# Patient Record
Sex: Female | Born: 1940
Health system: Southern US, Community
[De-identification: ages and names within clinical notes are randomized; demographics above are authoritative.]

## PROBLEM LIST (undated history)

## (undated) DIAGNOSIS — R001 Bradycardia, unspecified: Secondary | ICD-10-CM

## (undated) DIAGNOSIS — N183 Chronic kidney disease, stage 3 unspecified: Secondary | ICD-10-CM

## (undated) DIAGNOSIS — R609 Edema, unspecified: Secondary | ICD-10-CM

## (undated) DIAGNOSIS — Z9289 Personal history of other medical treatment: Secondary | ICD-10-CM

## (undated) DIAGNOSIS — I639 Cerebral infarction, unspecified: Secondary | ICD-10-CM

## (undated) DIAGNOSIS — G4733 Obstructive sleep apnea (adult) (pediatric): Secondary | ICD-10-CM

## (undated) DIAGNOSIS — K922 Gastrointestinal hemorrhage, unspecified: Secondary | ICD-10-CM

## (undated) DIAGNOSIS — I10 Essential (primary) hypertension: Secondary | ICD-10-CM

## (undated) DIAGNOSIS — I482 Chronic atrial fibrillation, unspecified: Secondary | ICD-10-CM

## (undated) DIAGNOSIS — D689 Coagulation defect, unspecified: Secondary | ICD-10-CM

## (undated) DIAGNOSIS — M1990 Unspecified osteoarthritis, unspecified site: Secondary | ICD-10-CM

## (undated) DIAGNOSIS — N179 Acute kidney failure, unspecified: Secondary | ICD-10-CM

## (undated) DIAGNOSIS — I272 Pulmonary hypertension, unspecified: Secondary | ICD-10-CM

## (undated) DIAGNOSIS — E785 Hyperlipidemia, unspecified: Secondary | ICD-10-CM

## (undated) DIAGNOSIS — K5790 Diverticulosis of intestine, part unspecified, without perforation or abscess without bleeding: Secondary | ICD-10-CM

## (undated) DIAGNOSIS — I872 Venous insufficiency (chronic) (peripheral): Secondary | ICD-10-CM

## (undated) DIAGNOSIS — K219 Gastro-esophageal reflux disease without esophagitis: Secondary | ICD-10-CM

## (undated) DIAGNOSIS — D649 Anemia, unspecified: Secondary | ICD-10-CM

## (undated) DIAGNOSIS — N280 Ischemia and infarction of kidney: Secondary | ICD-10-CM

## (undated) DIAGNOSIS — N12 Tubulo-interstitial nephritis, not specified as acute or chronic: Secondary | ICD-10-CM

## (undated) HISTORY — PX: EYE SURGERY: SHX253

## (undated) HISTORY — DX: Morbid (severe) obesity due to excess calories: E66.01

## (undated) HISTORY — DX: Edema, unspecified: R60.9

## (undated) HISTORY — PX: TUBAL LIGATION: SHX77

## (undated) HISTORY — DX: Ischemia and infarction of kidney: N28.0

## (undated) HISTORY — PX: JOINT REPLACEMENT: SHX530

## (undated) HISTORY — DX: Chronic kidney disease, stage 3 unspecified: N18.30

## (undated) HISTORY — PX: REPLACEMENT TOTAL KNEE BILATERAL: SUR1225

## (undated) HISTORY — DX: Chronic atrial fibrillation, unspecified: I48.20

## (undated) HISTORY — DX: Bradycardia, unspecified: R00.1

## (undated) HISTORY — DX: Pulmonary hypertension, unspecified: I27.20

## (undated) HISTORY — DX: Hyperlipidemia, unspecified: E78.5

## (undated) HISTORY — DX: Chronic kidney disease, stage 3 (moderate): N18.3

## (undated) HISTORY — DX: Cerebral infarction, unspecified: I63.9

## (undated) HISTORY — DX: Venous insufficiency (chronic) (peripheral): I87.2

---

## 1997-10-18 ENCOUNTER — Other Ambulatory Visit: Admission: RE | Admit: 1997-10-18 | Discharge: 1997-10-18 | Payer: Self-pay | Admitting: Family Medicine

## 1997-11-09 ENCOUNTER — Inpatient Hospital Stay (HOSPITAL_COMMUNITY): Admission: EM | Admit: 1997-11-09 | Discharge: 1997-11-11 | Payer: Self-pay | Admitting: Emergency Medicine

## 1998-06-16 ENCOUNTER — Encounter: Payer: Self-pay | Admitting: Emergency Medicine

## 1998-06-16 ENCOUNTER — Emergency Department (HOSPITAL_COMMUNITY): Admission: EM | Admit: 1998-06-16 | Discharge: 1998-06-16 | Payer: Self-pay | Admitting: Emergency Medicine

## 1999-01-14 ENCOUNTER — Encounter: Payer: Self-pay | Admitting: Emergency Medicine

## 1999-01-14 ENCOUNTER — Emergency Department (HOSPITAL_COMMUNITY): Admission: EM | Admit: 1999-01-14 | Discharge: 1999-01-14 | Payer: Self-pay | Admitting: Emergency Medicine

## 1999-03-27 ENCOUNTER — Encounter: Payer: Self-pay | Admitting: Emergency Medicine

## 1999-03-27 ENCOUNTER — Emergency Department (HOSPITAL_COMMUNITY): Admission: EM | Admit: 1999-03-27 | Discharge: 1999-03-27 | Payer: Self-pay | Admitting: Emergency Medicine

## 1999-06-22 ENCOUNTER — Emergency Department (HOSPITAL_COMMUNITY): Admission: EM | Admit: 1999-06-22 | Discharge: 1999-06-22 | Payer: Self-pay | Admitting: *Deleted

## 1999-06-22 ENCOUNTER — Encounter: Payer: Self-pay | Admitting: *Deleted

## 1999-08-09 ENCOUNTER — Inpatient Hospital Stay (HOSPITAL_COMMUNITY): Admission: EM | Admit: 1999-08-09 | Discharge: 1999-08-13 | Payer: Self-pay | Admitting: Emergency Medicine

## 1999-08-09 ENCOUNTER — Encounter: Payer: Self-pay | Admitting: Emergency Medicine

## 1999-08-11 ENCOUNTER — Encounter: Payer: Self-pay | Admitting: Internal Medicine

## 1999-09-05 ENCOUNTER — Encounter: Payer: Self-pay | Admitting: Internal Medicine

## 1999-09-05 ENCOUNTER — Ambulatory Visit (HOSPITAL_COMMUNITY): Admission: RE | Admit: 1999-09-05 | Discharge: 1999-09-05 | Payer: Self-pay | Admitting: Internal Medicine

## 1999-12-01 ENCOUNTER — Other Ambulatory Visit: Admission: RE | Admit: 1999-12-01 | Discharge: 1999-12-01 | Payer: Self-pay | Admitting: Obstetrics and Gynecology

## 1999-12-01 ENCOUNTER — Encounter (INDEPENDENT_AMBULATORY_CARE_PROVIDER_SITE_OTHER): Payer: Self-pay | Admitting: Specialist

## 2000-04-30 ENCOUNTER — Ambulatory Visit: Admission: RE | Admit: 2000-04-30 | Discharge: 2000-04-30 | Payer: Self-pay | Admitting: Orthopedic Surgery

## 2001-02-11 ENCOUNTER — Emergency Department (HOSPITAL_COMMUNITY): Admission: EM | Admit: 2001-02-11 | Discharge: 2001-02-11 | Payer: Self-pay

## 2001-04-29 ENCOUNTER — Emergency Department (HOSPITAL_COMMUNITY): Admission: EM | Admit: 2001-04-29 | Discharge: 2001-04-30 | Payer: Self-pay | Admitting: *Deleted

## 2001-04-30 ENCOUNTER — Encounter: Payer: Self-pay | Admitting: Emergency Medicine

## 2001-06-16 ENCOUNTER — Encounter: Payer: Self-pay | Admitting: Orthopedic Surgery

## 2001-06-22 ENCOUNTER — Inpatient Hospital Stay (HOSPITAL_COMMUNITY): Admission: RE | Admit: 2001-06-22 | Discharge: 2001-06-27 | Payer: Self-pay | Admitting: Orthopedic Surgery

## 2002-04-06 ENCOUNTER — Emergency Department (HOSPITAL_COMMUNITY): Admission: EM | Admit: 2002-04-06 | Discharge: 2002-04-06 | Payer: Self-pay | Admitting: Emergency Medicine

## 2002-04-06 ENCOUNTER — Encounter: Payer: Self-pay | Admitting: Emergency Medicine

## 2002-06-05 ENCOUNTER — Encounter: Admission: RE | Admit: 2002-06-05 | Discharge: 2002-06-05 | Payer: Self-pay | Admitting: Orthopedic Surgery

## 2002-06-05 ENCOUNTER — Encounter: Payer: Self-pay | Admitting: Orthopedic Surgery

## 2002-06-07 ENCOUNTER — Other Ambulatory Visit: Admission: RE | Admit: 2002-06-07 | Discharge: 2002-06-07 | Payer: Self-pay | Admitting: Obstetrics and Gynecology

## 2002-06-16 ENCOUNTER — Encounter: Payer: Self-pay | Admitting: Orthopedic Surgery

## 2002-06-21 ENCOUNTER — Encounter (INDEPENDENT_AMBULATORY_CARE_PROVIDER_SITE_OTHER): Payer: Self-pay | Admitting: *Deleted

## 2002-06-21 ENCOUNTER — Inpatient Hospital Stay (HOSPITAL_COMMUNITY): Admission: RE | Admit: 2002-06-21 | Discharge: 2002-06-26 | Payer: Self-pay | Admitting: Orthopedic Surgery

## 2002-12-21 ENCOUNTER — Emergency Department (HOSPITAL_COMMUNITY): Admission: EM | Admit: 2002-12-21 | Discharge: 2002-12-22 | Payer: Self-pay

## 2002-12-22 ENCOUNTER — Encounter: Payer: Self-pay | Admitting: Emergency Medicine

## 2003-03-30 ENCOUNTER — Emergency Department (HOSPITAL_COMMUNITY): Admission: EM | Admit: 2003-03-30 | Discharge: 2003-03-30 | Payer: Self-pay | Admitting: Emergency Medicine

## 2003-09-08 ENCOUNTER — Emergency Department (HOSPITAL_COMMUNITY): Admission: EM | Admit: 2003-09-08 | Discharge: 2003-09-08 | Payer: Self-pay | Admitting: Emergency Medicine

## 2003-11-08 ENCOUNTER — Ambulatory Visit (HOSPITAL_COMMUNITY): Admission: RE | Admit: 2003-11-08 | Discharge: 2003-11-08 | Payer: Self-pay | Admitting: Family Medicine

## 2004-04-16 ENCOUNTER — Ambulatory Visit (HOSPITAL_COMMUNITY): Admission: RE | Admit: 2004-04-16 | Discharge: 2004-04-16 | Payer: Self-pay | Admitting: Nurse Practitioner

## 2004-04-16 ENCOUNTER — Ambulatory Visit: Payer: Self-pay | Admitting: Nurse Practitioner

## 2004-09-17 ENCOUNTER — Emergency Department (HOSPITAL_COMMUNITY): Admission: EM | Admit: 2004-09-17 | Discharge: 2004-09-17 | Payer: Self-pay | Admitting: Emergency Medicine

## 2004-12-15 ENCOUNTER — Other Ambulatory Visit: Admission: RE | Admit: 2004-12-15 | Discharge: 2004-12-15 | Payer: Self-pay | Admitting: Family Medicine

## 2004-12-15 ENCOUNTER — Ambulatory Visit: Payer: Self-pay | Admitting: Nurse Practitioner

## 2004-12-24 ENCOUNTER — Ambulatory Visit (HOSPITAL_COMMUNITY): Admission: RE | Admit: 2004-12-24 | Discharge: 2004-12-24 | Payer: Self-pay | Admitting: Family Medicine

## 2006-01-31 ENCOUNTER — Emergency Department (HOSPITAL_COMMUNITY): Admission: EM | Admit: 2006-01-31 | Discharge: 2006-01-31 | Payer: Self-pay | Admitting: Emergency Medicine

## 2007-01-22 ENCOUNTER — Inpatient Hospital Stay (HOSPITAL_COMMUNITY): Admission: EM | Admit: 2007-01-22 | Discharge: 2007-01-28 | Payer: Self-pay | Admitting: Emergency Medicine

## 2007-03-09 ENCOUNTER — Encounter: Admission: RE | Admit: 2007-03-09 | Discharge: 2007-03-09 | Payer: Self-pay | Admitting: *Deleted

## 2008-01-20 ENCOUNTER — Inpatient Hospital Stay (HOSPITAL_COMMUNITY): Admission: EM | Admit: 2008-01-20 | Discharge: 2008-01-24 | Payer: Self-pay | Admitting: Emergency Medicine

## 2008-02-03 ENCOUNTER — Emergency Department (HOSPITAL_COMMUNITY): Admission: EM | Admit: 2008-02-03 | Discharge: 2008-02-03 | Payer: Self-pay | Admitting: Emergency Medicine

## 2009-05-08 ENCOUNTER — Encounter
Admission: RE | Admit: 2009-05-08 | Discharge: 2009-05-08 | Payer: Self-pay | Source: Home / Self Care | Admitting: Internal Medicine

## 2010-04-06 DEATH — deceased

## 2010-04-27 ENCOUNTER — Encounter: Payer: Self-pay | Admitting: Internal Medicine

## 2010-04-28 ENCOUNTER — Encounter: Payer: Self-pay | Admitting: Internal Medicine

## 2010-05-01 ENCOUNTER — Observation Stay (HOSPITAL_COMMUNITY)
Admission: EM | Admit: 2010-05-01 | Discharge: 2010-05-03 | Payer: Self-pay | Source: Home / Self Care | Attending: Internal Medicine | Admitting: Internal Medicine

## 2010-05-01 LAB — CK TOTAL AND CKMB (NOT AT ARMC)
CK, MB: 1.4 ng/mL (ref 0.3–4.0)
Relative Index: 1.1 (ref 0.0–2.5)

## 2010-05-01 LAB — GLUCOSE, CAPILLARY: Glucose-Capillary: 149 mg/dL — ABNORMAL HIGH (ref 70–99)

## 2010-05-01 LAB — CBC
Hemoglobin: 12.3 g/dL (ref 12.0–15.0)
MCH: 29.2 pg (ref 26.0–34.0)
RBC: 4.21 MIL/uL (ref 3.87–5.11)

## 2010-05-01 LAB — TROPONIN I: Troponin I: 0.01 ng/mL (ref 0.00–0.06)

## 2010-05-01 LAB — DIFFERENTIAL
Basophils Relative: 0 % (ref 0–1)
Lymphocytes Relative: 44 % (ref 12–46)
Monocytes Relative: 11 % (ref 3–12)
Neutro Abs: 2.1 10*3/uL (ref 1.7–7.7)
Neutrophils Relative %: 42 % — ABNORMAL LOW (ref 43–77)

## 2010-05-01 LAB — BRAIN NATRIURETIC PEPTIDE: Pro B Natriuretic peptide (BNP): 40 pg/mL (ref 0.0–100.0)

## 2010-05-01 LAB — POCT CARDIAC MARKERS
Myoglobin, poc: 96 ng/mL (ref 12–200)
Troponin i, poc: <0.05 ng/mL (ref 0.00–0.09)

## 2010-05-01 LAB — HEPATIC FUNCTION PANEL
AST: 60 U/L — ABNORMAL HIGH (ref 0–37)
Albumin: 3.2 g/dL — ABNORMAL LOW (ref 3.5–5.2)
Total Protein: 7.3 g/dL (ref 6.0–8.3)

## 2010-05-01 LAB — BASIC METABOLIC PANEL
Calcium: 9 mg/dL (ref 8.4–10.5)
Chloride: 106 mEq/L (ref 96–112)
Creatinine, Ser: 1 mg/dL (ref 0.4–1.2)
GFR calc Af Amer: >60 mL/min (ref >60)

## 2010-05-01 LAB — D-DIMER, QUANTITATIVE: D-Dimer, Quant: 2.26 ug/mL-FEU — ABNORMAL HIGH (ref 0.00–0.48)

## 2010-05-02 LAB — COMPREHENSIVE METABOLIC PANEL
Albumin: 3.2 g/dL — ABNORMAL LOW (ref 3.5–5.2)
BUN: 11 mg/dL (ref 6–23)
Chloride: 104 mEq/L (ref 96–112)
Creatinine, Ser: 0.95 mg/dL (ref 0.4–1.2)
GFR calc non Af Amer: 58 mL/min — ABNORMAL LOW (ref >60)
Total Bilirubin: 0.9 mg/dL (ref 0.3–1.2)

## 2010-05-02 LAB — LIPID PANEL: HDL: 31 mg/dL — ABNORMAL LOW (ref >39)

## 2010-05-02 LAB — DIGOXIN LEVEL: Digoxin Level: 0.7 ng/mL — ABNORMAL LOW (ref 0.8–2.0)

## 2010-05-02 LAB — PROTIME-INR
INR: 0.98 (ref 0.00–1.49)
Prothrombin Time: 13.2 seconds (ref 11.6–15.2)

## 2010-05-02 LAB — GLUCOSE, CAPILLARY: Glucose-Capillary: 105 mg/dL — ABNORMAL HIGH (ref 70–99)

## 2010-05-02 LAB — TSH: TSH: 1.543 u[IU]/mL (ref 0.350–4.500)

## 2010-05-02 LAB — CARDIAC PANEL(CRET KIN+CKTOT+MB+TROPI)
Relative Index: 1.1 (ref 0.0–2.5)
Total CK: 171 U/L (ref 7–177)
Troponin I: 0.01 ng/mL (ref 0.00–0.06)
Troponin I: 0.01 ng/mL (ref 0.00–0.06)

## 2010-05-02 LAB — CBC
MCH: 28.5 pg (ref 26.0–34.0)
MCV: 87.6 fL (ref 78.0–100.0)
Platelets: 151 10*3/uL (ref 150–400)
RBC: 4.21 MIL/uL (ref 3.87–5.11)
RDW: 13.6 % (ref 11.5–15.5)

## 2010-05-03 LAB — GLUCOSE, CAPILLARY

## 2010-05-03 LAB — CBC
MCH: 29 pg (ref 26.0–34.0)
MCV: 86.2 fL (ref 78.0–100.0)
Platelets: 164 10*3/uL (ref 150–400)
RDW: 13.5 % (ref 11.5–15.5)

## 2010-05-05 NOTE — Consult Note (Signed)
Robin Snow, DITTO NO.:  [id number]  MEDICAL RECORD NO.:  [id number]          PATIENT TYPE:  OBV  LOCATION:  2011                         FACILITY:  [facility]  PHYSICIAN:  Verne Carrow, MDDATE OF BIRTH:  12/14/1940  DATE OF CONSULTATION:  05/02/2010 DATE OF DISCHARGE:                                CONSULTATION   PRIMARY CARE PHYSICIAN:  Fleet Contras, MD  REASON FOR CONSULTATION:  Chest discomfort.  HISTORY OF PRESENT ILLNESS:  Robin Snow is a 70 year old African American female with no known cardiac history other than atrial fibrillation and class II chronic diastolic congestive heart failure, but CAD equivalents of NIDDM, risk factors of hypertension, hyperlipidemia, advanced age, and obesity who presents with chest discomfort that has resolved, first noted yesterday at rest.  The patient was in her usual state of health until she was driving her grandson to school when she noted substernal chest discomfort described as a tightness that was worse with inspiration slightly better with expiration, but continuous in nature for at least an hour and with recurrent episodes over the next several hours.  Symptoms resolved in the emergency department with morphine and sublingual nitroglycerin when she eventually presented to [facility] ED for further evaluation.  The patient describes associated shortness of breath above and beyond baseline shortness of breath, but only mild in nature.  She was also nauseous without any episodes of vomiting.  She did not have diaphoresis.  There was no radiation.  She was especially anxious about her symptoms as they continued and reports the severity at worst at 10/10 including approximately 10/10 for about 1 hour with the initial episode first noted while driving.  The patient denies any other recent changes including no orthopnea, no PND, no change to minimal lower extremity edema that is chronic, no presyncope, no  fever/chills, no significant change to mild chronic dry cough, no palpitations, no wheezing, no significant increase in stress, no bleeding or dark stools or other complaints.  In the emergency department, the patient had chest x-ray that showed stable cardiomegaly and pulmonary vascular congestion with chronic interstitial lung disease.  CT of the chest was negative for PE, but did show some pulmonary nodules.  Minimal atherosclerotic changes on that study as well.  EKG showed atrial fibrillation with minimal T-wave inversion and not significantly changed from an old tracing.  She was bradycardic in the 40s and on telemetry, the patient's bradycardia down to the 30s with several pauses, none equal to or greater than 3 seconds.  Laboratory studies notable for two sets of point-of-care markers that are negative and two full sets of enzymes that are negative, also magnesium of 2.1, creatinine of 0.95, and the digoxin level of 0.7. Other than her bradycardia, the patient has had mild hypertension with peak systolic at 182.  Vital signs otherwise within normal limits and stable.  Currently, the patient is asymptomatic and has had no recurrence of her symptoms since they initially resolved in the emergency department.  PAST MEDICAL HISTORY: 1. NIDDM. 2. Hypertension. 3. Dyslipidemia. 4. Chronic atrial fibrillation (not on Coumadin secondary to     prohibitive cough). 5.  Chronic diastolic CHF, class II. 6. Obesity (BMI equal to 39). 7. COPD. 8. History of pneumonia. 9. Bradycardia. 10.S/P cholecystectomy. 11.S/P total abdominal hysterectomy.  SOCIAL HISTORY:  The patient lives in [place] with two of her adult children and some of her grandchildren.  She has a remote tobacco abuse history, quitting greater than 20 years ago, minimal EtOH without anything close to binge drinking.  No illicit drug use.  No herbal meds. Generally low-sodium diet.  No regular exercise, but very  active, totally independent with daily activities and taking care of her grandchildren and assisting with partially disabled adult son.  FAMILY HISTORY:  Negative for premature diagnosis of coronary artery disease or any known significant cardiac history in first-degree relatives.  REVIEW OF SYSTEMS:  Please see HPI.  All other systems reviewed and were negative.  CODE STATUS:  Full.  ALLERGIES: 1. VIOXX. 2. ASPIRIN. 3. HYDROCODONE. 4. ACETAMINOPHEN.  MEDICATIONS: 1. Digoxin 0.25 mg p.o. daily. 2. Lisinopril 20 mg p.o. daily. 3. Oxybutynin 5 mg p.o. b.i.d. p.r.n. 4. Hydrochlorothiazide 25 mg p.o. daily. 5. Glimepiride 2 mg p.o. daily. 6. Lovastatin 20 mg nightly. 7. Potassium chloride 20 mg p.o. daily.  PHYSICAL EXAMINATION:  VITAL SIGNS:  Temperature 97.7 degrees Fahrenheit with a pulse rate generally in the 40s dipping down nonsustained in the 30s with pauses noted on telemetry of less than 3 seconds, respiration rate 18-20, blood pressure ranging from 140-182/70-86, O2 saturation 95% on room air. GENERAL:  Alert and oriented x3.  No apparent stress, able to speak easily in full sentences without respiratory distress. HEENT:  Head is normocephalic and atraumatic.  Pupils are equal, round, and reactive to light.  Extraocular muscles are intact.  Nares are patent without discharge.  Oropharynx without erythema or exudates. NECK:  Supple without lymphadenopathy.  No thyromegaly.  No JVD. HEART:  Rate is regular and bradycardic in the 40s with audible S1-S2. No clicks, rubs, murmurs, or gallops.  Pulses are 2+ and equal in upper extremities, weak in lower extremities, but brisk cap refill. LUNGS:  Clear to auscultation bilaterally. SKIN:  No rashes, lesions or petechiae. ABDOMEN:  Soft, nontender, nondistended.  Normal abdominal bowel sounds. No rebound or guarding.  The patient is obese. EXTREMITIES:  Without clubbing, cyanosis or edema. MUSCULOSKELETAL:  Without joint  deformity or effusions.  No spinal or CVA tenderness. NEUROLOGIC:  Cranial nerves II through XII grossly intact.  Strengths are 5/5 in all extremities and axial groups, normal sensation throughout and normal cerebellar function.  RADIOLOGY: 1. Chest x-ray shows stable cardiomegaly with pulmonary vascular     congestion and chronic interstitial lung disease.  No acute     abnormality.  CT angiography of the chest showed no evidence of     pulmonary embolism and minimal atherosclerotic changes as well as     some pulmonary nodules, recommended 3-6 months followup if at high     risk for bronchogenic carcinoma, 6 months or greater if at lower     risk. 2. EKG:  Atrial fibrillation with slow ventricular response at 46 bpm,     minimal T-wave inversion and no significant Q-waves, slight left     axis deviation without evidence of hypertrophy, QRS 84, QTc 351.     No significant changes since tracing completed in November 2011.  LABORATORY DATA:  WBC is 6.1, HGB 12.0, HCT 36.9, PLT count is 151. Sodium 140, potassium 3.8, chloride 104, bicarb 29, BUN 11, creatinine 0.95, glucose 94, alkaline phosphatase 87,  AST 43, ALT 34.  TSH 1.543. Digoxin level 0.70, magnesium 2.1.  Point-of-care markers are negative x2 and two full sets of enzymes are negative.  Total cholesterol 142, triglycerides 111, HDL 31, and LDL 89.  ASSESSMENT AND PLAN:  The patient was initially seen by Lenard Galloway, PA- C and then seen and thoroughly assessed by attending cardiologist/interventionalist, Dr. Verne Carrow.  Robin Snow is a 70 year old African American female with no known history of coronary artery disease, but cardiac history including chronic atrial fibrillation (not on Coumadin), chronic diastolic CHF, class II, and coronary artery disease equivalent of noninsulin-dependent diabetes mellitus, risk factors of hypertension, dyslipidemia and obesity presenting with mostly atypical chest discomfort  without any objective evidence as of yet of a cardiac etiology.  Chest pain - the patient has high pretest probability given the above coronary artery disease equivalent and multiple risk factors, although the patient's chest discomfort does seem atypical, Lexiscan Myoview for risk stratification does seem to warranted in this case.  If this is positive, unfortunately we will have to proceed for cardiac catheterization.  We will attempt to get this study completed today as the patient is n.p.o.  Bradycardia - would discontinue digoxin therapy at this time and watch heart rate.  Thank you for the consult.  We will continue to follow.     Jarrett Ables, PAC   ______________________________ Verne Carrow, MD    MS/MEDQ  D:  05/02/2010  T:  05/02/2010  Job:  [id number]  Electronically Signed by Jarrett Ables PAC on 05/05/2010 02:13:29 PM Electronically Signed by Verne Carrow MD on 05/05/2010 02:44:53 PM

## 2010-05-07 NOTE — H&P (Signed)
  NAME:  MARGARIT, MINSHALL NO.:  [id number]  MEDICAL RECORD NO.:  [id number]           PATIENT TYPE:  LOCATION:                                 FACILITY:  PHYSICIAN:  Eduard Clos, MDDATE OF BIRTH:  May 20, 1940  DATE OF ADMISSION: DATE OF DISCHARGE:                             HISTORY & PHYSICAL   ADDENDUM  In addition to already dictated history and physical, this is to stress the fact that the patient's CT angio chest was showing pulmonary nodules in right upper lobe which will need close followup with a repeat CT in 3 months.     Eduard Clos, MD     ANK/MEDQ  D:  05/01/2010  T:  05/02/2010  Job:  [id number]  cc:   Fleet Contras, M.D.  Electronically Signed by Midge Minium MD on 05/07/2010 10:05:36 AM

## 2010-05-07 NOTE — H&P (Signed)
[name], [name] NO.:  [id number]  MEDICAL RECORD NO.:  [id number]          PATIENT TYPE:  EMS  LOCATION:  [facility]                         FACILITY:  [facility]  PHYSICIAN:  Eduard Clos, MDDATE OF BIRTH:  15-Jan-1941  DATE OF ADMISSION:  05/01/2010 DATE OF DISCHARGE:                             HISTORY & PHYSICAL   PRIMARY CARE PHYSICIAN:  Fleet Contras, MD  CHIEF COMPLAINT:  Chest pain.  HISTORY OF PRESENT ILLNESS:  A 70 year old female with known history of diabetes mellitus type 2, hypertension, atrial fibrillation, not on Coumadin as the patient states she cannot afford it, has been experiencing some chest pain since today morning, the patient states that she was trying to drop her grandsons to the school when she started developing some chest pain, the chest pain is retrosternal, it something like somebody is beating her, initially had some palpitation, no shortness of breath with pain was radiating to the back and both shoulders and arms, has no relation to exertion, has happened occasionally four times over the day.  At this time, the patient in the ER had cardiac enzymes which were negative and EKG did not show anything acute except for AFib with nonspecific ST-T changes.  The patient has been admitted for further workup for chest pain.  The patient denies any headache, visual symptoms, any focal deficit, dizziness, loss of consciousness, any abdominal pain, dysuria, discharges, nausea, vomiting, diarrhea, fever, or chills.  PAST MEDICAL HISTORY: 1. Atrial fibrillation. 2. Diabetes mellitus type 2. 3. Hyperlipidemia. 4. Hypertension.  PAST SURGICAL HISTORY:  Cholecystectomy, hysterectomy.  MEDICATIONS PRIOR TO ADMISSION: 1. Ibuprofen over the counter p.r.n. 2. Digoxin 0.25 mg p.o. daily. 3. Lisinopril 20 mg p.o. twice daily. 4. Oxybutynin 5 mg twice daily. 5. Hydrochlorothiazide 25 mg p.o. daily. 6. Glimepiride 2 mg p.o. daily. 7.  Lovastatin 20 mg daily. 8. Potassium chloride 20 mg p.o. daily.  ALLERGIES:  VIOXX, ASPIRIN, and HYDROCODONE and ACETAMINOPHEN.  SOCIAL HISTORY:  The patient denies smoking cigarettes, drinking alcohol, or use illegal drugs.  FAMILY HISTORY:  Significant for coronary artery disease, diabetes, and hypertension.  REVIEW OF SYSTEMS:  As per history of present illness, nothing else significant.  PHYSICAL EXAMINATION:  GENERAL:  The patient was examined at bedside not in acute distress. VITAL SIGNS:  Blood pressure is 104/60, pulse is 54 per minute, temperature 97.9, respiration 20 per minute, O2 sat 100%. HEENT:  Anicteric.  No pallor.  No discharge from ears, eyes, nose or mouth. CHEST:  Bilateral air entry present.  No rhonchi.  No crepitation. HEART:  S1 and S2 heard. ABDOMEN:  Soft and nontender.  Bowel sounds present. CNS:  The patient is alert, awake, and oriented to time, place, and person. EXTREMITIES:  Moves upper and lower extremities 5/5, symmetric. Peripheral pulses felt.  No edema.  LABORATORY DATA:  EKG shows atrial fibrillation, the rate around 64 beats per minute with poor R-wave progression and nonspecific ST-T changes.  Chest x-ray shows stable cardiomegaly and pulmonary vascular congestion, chronic interstitial lung disease, and no acute abnormality. CT angio of the chest shows no evidence of peripheral embolism, mild scattered,  atherosclerotic changes, pulmonary nodules largest at 8 mm, right upper lobe recommendations seen below.  CBC; WBCs 5.1, hemoglobin 12.3, hematocrit 36.7, platelets 154.  D-dimer is 2.26.  Basic metabolic panel; sodium 143, potassium 3.6, chloride 106, carbon dioxide 31, glucose 103, BUN 9, creatinine 1, calcium 9, CK-MB less than 1 and 1.2, troponin-I is less than 0.05, less than 0.05, myoglobin is 96 and 142, BNP is 46.  ASSESSMENT: 1. Chest pain to rule out acute coronary syndrome. 2. Atrial fibrillation, at this time rate  controlled, mildly     bradycardic. 3. Diabetes mellitus type 2. 4. Hypertension. 5. Hyperlipidemia. 6. Nonsteroidal anti-inflammatory drug use.  PLAN: 1. At this time, admit the patient to telemetry. 2. For her chest pain, we will cycle cardiac markers, get 2-D echo.     We will hold off NSAIDs at this time, nitroglycerin p.r.n. and as     the patient has serial allergic reaction to aspirin, the patient     will be on Plavix. 3. Atrial fibrillation, at this time is rate controlled, in fact the     patient is slightly bradycardic.  We are going to check her digoxin     level.  If levels are high, we need to hold the digoxin.  The     patient states that she used to take Coumadin, but she is not     taking now as the patient has difficulty into follow up and also     not able to afford it. 4. Diabetes mellitus type 2 and hypertension.  Continue her present     medications.  We will check lipid panel and hemoglobin A1c, place     the patient on sliding scale coverage and further recommendation     based on test     ordered. 5. As the patient has risk factors, we will consult Cardiology in a.m.     If the cardiac enzymes turned out to be positive, call Cardiology     earlier.     Eduard Clos, MD     ANK/MEDQ  D:  05/01/2010  T:  05/01/2010  Job:  [id number]  cc:   Fleet Contras, M.D.  Electronically Signed by Midge Minium MD on 05/07/2010 10:05:31 AM

## 2010-05-10 NOTE — Discharge Summary (Signed)
NAMEONYINYECHI, HUANTE              ACCOUNT NO.:  [id number]  MEDICAL RECORD NO.:  [id number]          PATIENT TYPE:  OBV  LOCATION:  2011                         FACILITY:  [facility]  PHYSICIAN:  Richarda Overlie, MD       DATE OF BIRTH:  12-18-1940  DATE OF ADMISSION:  05/01/2010 DATE OF DISCHARGE:  05/02/2010                              DISCHARGE SUMMARY   PRIMARY CARE PHYSICIAN:  Fleet Contras, M.D.  DISCHARGE DIAGNOSES: 1. Chest pain. 2. Likely noncardiac atrial fibrillation, not on anticoagulation     because of noncompliance. 3. Chronic diastolic heart failure, class II. 4. Non-insulin dependent diabetes. 5. Hypertension. 6. Dyslipidemia. 7. Morbid obesity.  CONSULTATIONS:  Dr. Clifton James for chest pain.  PROCEDURES: 1. Nuclear myocardial perfusion imaging shows no inducible ischemia,     EF of 65% with normal left ventricular function. 2. CT angio shows no evidence of PE, mildly scattered pulmonary     nodules, largest one being 8 mm in the right upper lobe.  The     patient is made aware of this. 3. Chest x-ray showed stable cardiomegaly with mild pulmonary vascular     congestion.  SUBJECTIVE:  This is a 70 year old African American female with no known cardiac history with a history of atrial fibrillation, chronic diastolic heart failure, non-insulin-dependent diabetes, hypertension, dyslipidemia, morbid obesity, who presents to the ED with a chief complaint of chest discomfort.  The patient noted substernal chest discomfort, described as tightness worse with deep inspiration, better with expiration.  Symptoms continued until the patient presented to the emergency department and was treated with morphine and sublingual nitro that provided significant relief.  The patient was admitted for further evaluation.  EKG showed atrial fibrillation with minimal T-wave inversion and no significant changes, and she was bradycardic with heart rate in the 40s on  telemetry.  HOSPITAL COURSE: 1. Chest pain associated with sinus bradycardia and atrial     fibrillation.  The patient's chest pain was evaluated by serial     cardiac enzymes, and her troponins were negative x3.  The patient's     telemetry showed atrial fibrillation with slow ventricular response     with a rate of +39.  The patient's digoxin level was found to be     subtherapeutic.  The patient was completely asymptomatic during     this  episode of slow ventricular response.  Because of the     patient's multiple cardiovascular risk factors, this patient had a     Lexiscan Myoview arranged by Cardiology and the results are     documented as above.  The patient was seen on the day of discharge     by Cardiology and was thought to be stable for discharge at this     time.  The patient's bradycardia seems to have resolved and despite     being on digoxin, her heart rate is 75 while she is awake with a     normal blood pressure and therefore her digoxin has been continued     at this time.  The patient has been chest pain free for the  last 48 hours. 2. Atrial fibrillation.  Heart rate seems to be under control with     digoxin.  The patient is recommended to take 0.125 mg of digoxin at     home.  She has been started back on anticoagulation and has     received Coumadin 7.5.  Coumadin instructions and Coumadin teaching     was done.  She is instructed to take few days of 7.5 mg of Coumadin     daily and then have her INR checked on Monday at her primary care     physician's office.  She has been counseled about been compliant     with the Coumadin.  DISCHARGE MEDICATIONS: 1. Protonix 40 mg p.o. daily. 2. Coumadin 7.5 mg on the 28th and the 29the and then 5 mg p.o. daily     with an INR check on Monday.  Digoxin half 0.125 mg p.o. in the     morning. 3. Glimepiride 2 mg p.o. daily. 4. HCTZ 12.5 mg p.o. daily. 5. Lisinopril 20 mg p.o. twice daily. 6. Lovastatin 20 mg p.o. daily. 7.  Oxybutynin 5 mg p.o. twice daily. 8. K-Dur 20 mEq p.o. daily.     Richarda Overlie, MD     NA/MEDQ  D:  05/03/2010  T:  05/03/2010  Job:  [id number]  cc:   Fleet Contras, M.D.  Electronically Signed by Richarda Overlie MD on 05/10/2010 04:08:17 PM

## 2010-05-22 ENCOUNTER — Encounter: Payer: Self-pay | Admitting: Cardiovascular Disease

## 2010-05-22 ENCOUNTER — Encounter (INDEPENDENT_AMBULATORY_CARE_PROVIDER_SITE_OTHER): Payer: 59 | Admitting: Cardiovascular Disease

## 2010-05-22 DIAGNOSIS — R072 Precordial pain: Secondary | ICD-10-CM | POA: Insufficient documentation

## 2010-05-22 DIAGNOSIS — I10 Essential (primary) hypertension: Secondary | ICD-10-CM | POA: Insufficient documentation

## 2010-05-22 DIAGNOSIS — I4821 Permanent atrial fibrillation: Secondary | ICD-10-CM | POA: Insufficient documentation

## 2010-05-22 DIAGNOSIS — I4891 Unspecified atrial fibrillation: Secondary | ICD-10-CM

## 2010-05-22 DIAGNOSIS — R079 Chest pain, unspecified: Secondary | ICD-10-CM | POA: Insufficient documentation

## 2010-05-28 NOTE — Assessment & Plan Note (Signed)
Summary: eph/d/c [facility] 05/03/10/per pt call/medicare/mj   Visit Type:  eph Primary Provider:  Dr. Rolla Plate  CC:  .edema. pt has no complaints today.  History of Present Illness: 70 yo AAF with history of DM, HTN, hyperlipidemia, atrial fibrillation, obesity and COPD admitted to [facility] 05/02/10 with CP. She ruled out for an MI with serial cardiac enzymes. Her echo showed normal LV size with moderate LVH and normal LV systolic function. Stress myoview without ischemia. CT angiogram without evidence of PE. Since discharge, she has had no further chest pains. She does get dyspneic with climbing stairs. Overall feeling well. She has had a history of atrial fibrillation for many years but has not been on coumadin, presumbably because of non-compliance in the past. Her coumadin was restarted in the hospital three weeks ago. Her INR is being followed in primary care.   Current Medications (verified): 1)  Warfarin Sodium 7.5 Mg Tabs (Warfarin Sodium) .... Use As Directed By Anticoagulation Clinic 2)  Digoxin 0.25 Mg/ml Soln (Digoxin) .... Take 1 Tablet By Mouth Once A Day 3)  Hydrochlorothiazide 12.5 Mg Tabs (Hydrochlorothiazide) .... Take One Tablet By Mouth Daily. 4)  Lisinopril 20 Mg Tabs (Lisinopril) .... Take One Tablet Two Times A Day 5)  Lovastatin 20 Mg Tabs (Lovastatin) .... Take 1 Tablet By Mouth Once A Day 6)  Potassium Chloride 20 Meq Pack (Potassium Chloride) .... Take 1 Tablet By Mouth Once A Day 7)  Glimepiride 2 Mg Tabs (Glimepiride) .... Once Daily 8)  Oxybutynin Chloride 5 Mg Tabs (Oxybutynin Chloride) .... Two Times A Day 9)  Furosemide 40 Mg Tabs (Furosemide) .... Take 1 Tablet By Mouth Once A Day 10)  Tramadol Hcl 50 Mg Tabs (Tramadol Hcl) .... One Tab By Mouth As Needed Two Times A Day  Allergies (verified): 1)  ! Vioxx 2)  ! Asa 3)  ! Hydrocodone 4)  ! Ed-Apap (Acetaminophen)  Past History:  Past Medical History: Atrial Fibrillation on chronic coumadin  therapy-restarted January 2012 Dyslipidemia Hypertension Diabetes Obesity Congenital Heart Disease C O P D Brabycardia  Past Surgical History: Cholecystectomy Hysterectomy Bilateral knee replacements-right done twice C section x 1  Family History: Reviewed history and no changes required. Mother died at age 65 during childbirth Father died at age 57 from cancer 1 sister and 1 brother alive 1 brother killed. No FH of CAD  Social History: Reviewed history and no changes required. 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  The patient denies fatigue, malaise, fever, weight gain/loss, vision loss, decreased hearing, hoarseness, chest pain, palpitations, prolonged cough, wheezing, sleep apnea, coughing up blood, abdominal pain, blood in stool, nausea, vomiting, diarrhea, heartburn, incontinence, blood in urine, muscle weakness, joint pain, rash, skin lesions, headache, fainting, dizziness, depression, anxiety, enlarged lymph nodes, easy bruising or bleeding, and environmental allergies.    Vital Signs:  Patient profile:   70 year old female Height:      67 inches Weight:      263.50 pounds BMI:     41.42 Pulse rate:   90 / minute Resp:     18 per minute BP sitting:   140 / 100  (left arm) Cuff size:   large  Vitals Entered By: Celestia Khat, CMA (May 22, 2010 12:10 PM)  Physical Exam  General:  General: Well developed, well nourished, NAD  HEENT: OP clear, mucus membranes moist SKIN: warm, dry Neuro: No focal deficits Musculoskeletal: Muscle strength 5/5 all ext Psychiatric: Mood and affect normal Neck: No JVD, no carotid bruits, no thyromegaly, no lymphadenopathy. Lungs:Clear bilaterally, no wheezes, rhonci, crackles CV: RRR no murmurs, gallops rubs Abdomen: soft, NT, ND, BS present Extremities: Trace bilateral lower ext  edema,  pulses 2+.    Nuclear Study  Procedure date:  05/02/2010  Findings:      Findings:  QGS ejection fraction measures 65%% , with an end-   diastolic volume of 102 ml and an end-systolic volume of 35 ml.    IMPRESSION:    1.  No evidence for pharmacologically induced myocardial ischemia.   2.  Left ventricular ejection fraction equals 65%.   3.  Normal left ventricular systolic function.   Echocardiogram  Procedure date:  05/02/2010  Findings:       Left ventricle: Wall thickness was increased in a pattern of     moderate LVH. Systolic function was normal. The estimated ejection     fraction was in the range of 60% to 65%.   - Mitral valve: Mild regurgitation.   - Left atrium: The atrium was moderately dilated.  EKG  Procedure date:  05/22/2010  Findings:      Atrial fibrillation, rate 86 bpm. Non-specific T wave abnormalities.   Impression & Recommendations:  Problem # 1:  CHEST PAIN-PRECORDIAL (ICD-786.51) Non-cardiac. Stress test normal three weeks ago. No recurrence of chest pain.   Her updated medication list for this problem includes:    Warfarin Sodium 7.5 Mg Tabs (Warfarin sodium) ..... Use as directed by anticoagulation clinic    Lisinopril 20 Mg Tabs (Lisinopril) .[name]... Take one tablet two times a day  Her updated medication list for this problem includes:    Warfarin Sodium 7.5 Mg Tabs (Warfarin sodium) ..... Use as directed by anticoagulation clinic    Lisinopril 20 Mg Tabs (Lisinopril) .[name]... Take one tablet two times a day  Problem # 2:  ATRIAL FIBRILLATION (ICD-427.31) Rate controlled. Anticoagulation with coumadin. Consider adding Lopressor 25 mg by mouth two times a day for rate control and BP control. Discussed with patient today. She is planning to Dr. Concepcion Elk next week and will discuss medication changes then. My recommendation would be to add Lopressor. INR followed in primary care office.   The following medications were removed from the  medication list:    Propafenone Hcl 150 Mg Tabs (Propafenone hcl) .[name]... Take one tablet by mouth every 8 hours Her updated medication list for this problem includes:    Warfarin Sodium 7.5 Mg Tabs (Warfarin sodium) ..... Use as directed by anticoagulation clinic    Digoxin 0.25 Mg/ml Soln (Digoxin) .[name]... Take 1 tablet by mouth once a day  The following medications were removed from the medication list:    Propafenone Hcl 150 Mg Tabs (Propafenone hcl) .[name]... Take one tablet by mouth every 8 hours Her updated medication list for this problem includes:    Warfarin Sodium 7.5 Mg Tabs (Warfarin sodium) ..... Use as directed by anticoagulation clinic    Digoxin 0.25 Mg/ml Soln (Digoxin) .[name]... Take 1 tablet by mouth once a day  Problem # 3:  HYPERTENSION, BENIGN (ICD-401.1) Elevated today. She has not taken her meds. She does not wish to make any medication changes today.   Her updated medication list for this problem includes:    Hydrochlorothiazide 12.5 Mg Tabs (Hydrochlorothiazide) .[name]... Take one tablet by mouth daily.  Lisinopril 20 Mg Tabs (Lisinopril) .[name]... Take one tablet two times a day    Furosemide 40 Mg Tabs (Furosemide) .[name]... Take 1 tablet by mouth once a day  Her updated medication list for this problem includes:    Hydrochlorothiazide 12.5 Mg Tabs (Hydrochlorothiazide) .[name]... Take one tablet by mouth daily.    Lisinopril 20 Mg Tabs (Lisinopril) .[name]... Take one tablet two times a day    Furosemide 40 Mg Tabs (Furosemide) .[name]... Take 1 tablet by mouth once a day  Other Orders: EKG w/ Interpretation (93000)  Patient Instructions: 1)  Your physician recommends that you schedule a follow-up appointment in: 6 months 2)  Your physician recommends that you continue on your current medications as directed. Please refer to the Current Medication list given to you today.

## 2010-08-19 NOTE — Discharge Summary (Signed)
NAMESHIVONNE, SCHWARTZMAN              ACCOUNT NO.:  [id number]   MEDICAL RECORD NO.:  [id number]          PATIENT TYPE:  INP   LOCATION:  1433                         FACILITY:  [facility]   PHYSICIAN:  Herbie Saxon, MDDATE OF BIRTH:  May 08, 1940   DATE OF ADMISSION:  01/22/2007  DATE OF DISCHARGE:  01/28/2007                               DISCHARGE SUMMARY   ADDENDUM:  1. History of coronary artery disease.  2. History of congestive heart failure.  3. History of morbid obesity.      Herbie Saxon, MD  Electronically Signed     MIO/MEDQ  D:  01/28/2007  T:  01/28/2007  Job:  [id number]

## 2010-08-19 NOTE — Discharge Summary (Signed)
Robin Snow, Robin Snow              ACCOUNT NO.:  [id number]   MEDICAL RECORD NO.:  [id number]          PATIENT TYPE:  INP   LOCATION:  1433                         FACILITY:  [facility]   PHYSICIAN:  Herbie Saxon, MDDATE OF BIRTH:  [birth year]   DATE OF ADMISSION:  01/22/2007  DATE OF DISCHARGE:  01/28/2007                               DISCHARGE SUMMARY   DISCHARGE DIAGNOSES:  1. Escherichia coli urinary tract infection.  2. Pyelonephritis, improved.  3. Atrial fibrillation, on Coumadin.  4. Diabetes.  5. Hypertension, fairly controlled.  6. Deconditioning.  7. Suboptimal digoxin level.  8. Bradycardic episodes.  9. Anemia of chronic disease.  10.Hyperlipidemia.  11.Mild thrombocytopenia.   HOSPITAL COURSE:  This 70 year old African-American lady was admitted  with complaint of fever, chills and lower abdominal suprapubic  discomfort.  Patient has also been having frequency and dysuria at  presentation.  When she was admitted the urinalysis was pointing to a  urinary tract infection.  Patient was started on IV ciprofloxacin.  Hypokalemia was repleted.  Patient's blood pressure was noticed to be  poorly controlled and medications were titrated.  She was noticed to be  having bradycardic episodes despite the fact that the digoxin level was  suboptimal and beta-blocker dose has been reduced.  She was increasingly  weak and tired prior to the beta-blocker dose reduction.  Patient is now  being ambulated slowly.  Coumadin therapy has been optimized with the  INR being 2.6 prior to discharge.  Leukocytosis resolved.  The urine  culture grew E. coli.  The patient's antibiotic was changed to IV  Rocephin.  Bradycardic episodes have resolved.  The patient's energy  level is much improved.  Physical Therapy was assisting with ambulating  and with conditioning.   DISCHARGE CONDITION:  Stable.   DIET:  Cardiac, 1800 calorie ADA.   ACTIVITY:  Will be increased slowly as tolerated  with assistance.   She is to follow up at [facility] in 3 to 5 days to have PT,  INR monitored presently by Dr. Patty Sermons once a week.  She is to follow  up with Dr. Cassell Clement, Cardiology, in 1 to 2 weeks.   MEDICATIONS ON DISCHARGE:  1. Coumadin 5 mg daily.  2. Zocor 10 mg daily.  3. Zestril 20 mg b.i.d.  4. Digoxin 250 mcg daily.  5. Lasix 40 mg daily.  6. K-Dur 20 mEq daily.  7. Avandia 4 mg daily.  8. Lopressor 75 mg b.i.d., hold if heart rate less than 60.  9. Cipro 500 mg b.i.d. for 3 more days.   On examination today, she is an elderly lady not in acute distress.  Temperature is 98, pulse is 66, respiratory rate is 20, blood pressure  140/70.  Pupils equal, reactive to light and accommodation.  She is  clinically pale, no jaundice.  NECK:  Supple.  OROPHARYNX AND NASOPHARYNX:  Clear.  There is no elevated JVD, no  thyromegaly, no carotid bruit.  HEART SOUNDS:  One, two and three irregularly irregular.  CHEST:  Clinically clear.  ABDOMEN:  Soft, nontender, no organomegaly.  She is alert, oriented x3.  Peripheral pulses present.  Trace pedal  edema.   LABS:  WBC 8, hematocrit 27, platelet count 165,000, INR is 2.6.  Chemistry:  Sodium is 135, potassium 3.6, chloride 101, bicarbonate 27,  glucose 101, BUN 11, creatinine 1.1,  She is to have a repeat digoxin  level as an outpatient in a week.  She is to have a repeat PT, INR and  [facility] in the next 3 to 5 days.  Patient is also being sent home with [facility] for followup  with her home PT and nurse monitoring of her Coumadin treatment.      Herbie Saxon, MD  Electronically Signed     MIO/MEDQ  D:  01/28/2007  T:  01/28/2007  Job:  [id number]   cc:   Cassell Clement, M.D.  Fax: [phone]

## 2010-08-19 NOTE — H&P (Signed)
Robin Snow, Robin Snow NO.:  [id number]   MEDICAL RECORD NO.:  [id number]          PATIENT TYPE:  EMS   LOCATION:  ED                           FACILITY:  [facility]   PHYSICIAN:  Altha Harm, MDDATE OF BIRTH:  12-27-1940   DATE OF ADMISSION:  01/22/2007  DATE OF DISCHARGE:                              HISTORY & PHYSICAL   CHIEF COMPLAINT:  Chills, and suprapubic pain.   HISTORY OF PRESENT ILLNESS:  This is a 70 year old patient with diabetes  type 2 who presents to the emergency room with complaints of chills x3  days.  The patient states that she also had nausea, and she describes  the pain in the suprapubic area as sharp.  The patient states that she  has also had frequency and dysuria occurring in the last 24 hours.  She  does not record a temperature or fever at home.  She had no emesis or  diarrhea.   PAST MEDICAL HISTORY:  1. Significant for hypertension.  2. Diabetes type 2.  3. Arthritis.  4. Atrial fibrillation.  5. Hyperlipidemia.   PAST SURGICAL HISTORY SIGNIFICANT FOR:  1. Tubal ligation.  2. Cholecystectomy.  3. C-section.   SOCIAL HISTORY:  The patient resides by herself.  She denies any  tobacco, alcohol, or drug use.   CURRENT MEDICATIONS:  1. Coumadin 5 mg p.o. daily.  2. Zocor 10 mg p.o. daily.  3. Zestril 20 mg p.o. daily.  4. Digoxin 250 mcg p.o. daily.  5. Lasix 40 mg p.o. daily.  6. Avandia 4 mg p.o. daily.  7. Lopressor 100 mg p.o. b.i.d.  8. Diovan/hydrochlorothiazide 160/12 mg p.o. daily.   ALLERGIES:  ASPIRIN, VICODIN.   PRIMARY MEDICAL DOCTOR:  Dr. Malvin Johns   REVIEW OF SYSTEMS:  The patient denies any seizures, loss of  consciousness, headache, dizziness, blurring of vision.  She denies any  difficulty swallowing, eating, or any mouth pain.  The patient denies  any shortness of breath, dyspnea on exertion, cough.  She denies any  chest pain, palpitations, or claudication.  She denies any weight loss  or weight  gain.  She denies any heat or cold intolerance, any diarrhea  or constipation, any loss of hair.  The patient denies any weakness.  She denies any easy bruising.  All other systems are negative except as  noted in the HPI.   In the emergency room studies done showed the following:  A white blood  cell count of 10.4, hemoglobin of 10.4, hematocrit of 30.4, platelet  count of 124.  Sodium 135, potassium 3.2, chloride 99, bicarbonate 30,  BUN 9, creatinine 1.13.  Digoxin level at 0.3, INR of 1.8.  PT of 21.1.   PHYSICAL EXAMINATION:  GENERAL:  The patient is resting comfortably in  bed in no acute distress.  VITAL SIGNS:  Blood pressure 137/85, temperature of 100.4, heart rate  114, respiratory rate 15.  O2 saturation 98% on room air.  HEENT:  She is normocephalic atraumatic.  Pupils equally round and  reactive to light and accommodation.  Extraocular movements are intact.  The patient has  no icterus, and there is no conjunctival pallor. Fundi  are benign.  Oropharynx is moist, no exudate or lesions noted.  NECK:  Trachea midline, no masses, no thyromegaly, no JVD, no carotid  bruits.  RESPIRATORY EXAMINATION:  The patient has a normal respiratory effort,  equal excursion bilaterally.  She is clear to auscultation.  No  wheezing, rales, or rhonchi noted.  She has described no increased vocal  fremitus noted.  CARDIOVASCULAR: She is a normal S1 and S2.  No murmurs, rubs, or gallops  are noted.  PMI is nondisplaced, no heaves or thrills on palpation.  ABDOMEN:  The patient has an obese abdomen.  She has normoactive bowel  sounds.  Abdomen is soft.  She has suprapubic tenderness.  No masses, no  hepatosplenomegaly noted.  LYMPH NODE SURVEY:  She has no cervical, axillary, or inguinal  lymphadenopathy noted.  NEUROLOGICAL:  Cranial nerves II-XII are grossly intact.  No focal or  neurological deficits.  DTRs are 2+ bilaterally upper and lower  extremities.  Sensation is intact to light  touch, pinprick, and  proprioception.  Strength is 4+/5 bilaterally upper and lower  extremities.  MUSCULOSKELETAL:  She has no warmth, swelling, or erythema around the  joints.  The patient does have some tenderness in the lumbar spine.  PSYCHIATRIC:  She is alert and oriented x3, good cognition and insight,  and goog recent and remote recall.   ASSESSMENT AND PLAN:  .  This is a patient who presents with  1. Urinary tract infection.  2. Diabetes type 2.  3. Atrial fibrillation, subtherapeutic on her digoxin.   PLAN:  To start ciprofloxacin, send a urine for culture, and support  with IV fluids.  The patient does have some mild hypokalemia, and this  will be replaced.  In terms of her atrial fibrillation the patient does  have a rapid ventricular response, and I will go ahead and load the  patient with digoxin and continue her at her digoxin level.  The patient  will be admitted to a telemetry bed.      Altha Harm, MD  Electronically Signed     MAM/MEDQ  D:  01/22/2007  T:  01/24/2007  Job:  [id number]   cc:   Cassell Clement, M.D.  Fax: [phone]

## 2010-08-19 NOTE — H&P (Signed)
NAMEJANESHA, BRISSETTE NO.:  [id number]   MEDICAL RECORD NO.:  [id number]          PATIENT TYPE:  EMS   LOCATION:  ED                           [facility]AC[facility]   PHYSICIAN:  Lucita Ferrara, MD         DATE OF BIRTH:  05-14-1940   DATE OF ADMISSION:  01/20/2008  DATE OF DISCHARGE:                              HISTORY & PHYSICAL   PRIMARY CARE DOCTOR:  Unassigned.   The patient is a 70 year old female who presents to [facility] with cough, productive in its characterization.  This cough has  been ongoing for 1 week accompanied by fevers, cold and chills type  symptoms, diffuse body ache and myalgias.  Sputum is yellow with blood  tinged.  She also had some nonbilious vomitus and unable to tolerate any  significant p.o..  Diarrhea has been ongoing times 1 day.  She denies  any dysuria.  She does have nasal congestion and postnasal drip.  She  also has a sore throat.  She denies recent sick contacts, recent travel.   PAST MEDICAL HISTORY:  Significant for:  1. Hypertension.  2. Diabetes type 2.  3. Osteoarthritis.  4. History of atrial fibrillation.  5. Hyperlipidemia.  6. History of congestive heart failure.  7. Diabetes type 2.  8. Thyroid disease.  9. History of hiatal hernia.  10.Peptic ulcer disease.   PAST SURGICAL HISTORY:  1. C-section, 1978.  2. Total knee arthroplasty, 1999.  3. Laparoscopic cholecystectomy in 1999.   SOCIAL HISTORY:  The patient resides by herself.  She denies drugs,  alcohol or tobacco.   MEDICATIONS:  She is documented here to take:  1. Coumadin 5 mg p.o. except Wednesday.  2. Zocor 10 mg p.o. daily.  3. Zestril 20 mg p.o. daily.  4. Digoxin 250 mcg p.o. daily.  5. Lasix 40 mg p.o. daily.  6. Avandia 4 mg p.o. daily.  7. Lopressor 100 mg p.o. b.i.d.  8. Diovan 40/12.5 p.o. daily.   Again, these medications are not completely reliable.   PHYSICAL EXAMINATION:  GENERAL:  Generally the patient is in no  acute  distress.  VITAL SIGNS:  Blood pressure is 150/84, pulse 99, respirations 18,  temperature 98.8.  HEENT: Normocephalic, atraumatic.  Sclerae is anicteric.  NECK:  Neck is supple.  No JVD, no carotid bruits.  PERRLA.  Extraocular  muscles are intact.  CARDIOVASCULAR:  S1, S2, regular rhythm.  She is tachy.  LUNGS:  Clear to auscultation bilaterally, no rhonchi, rales or wheezes.  ABDOMEN:  Soft, nontender, nondistended.  Positive bowel sounds.  NEURO:  Patient is alert and oriented x3.  Cranial nerves II-XII are  grossly intact.   LABORATORY DATA:  Urinalysis shows a few yeast, beta natriuretic peptide  is 102, there are positive nitrites and small amount of leukocytes.  Complete metabolic panel shows an AST, ALT and alk phos 69, 39, 22, BUN  is 5, creatinine is 1.  Cardiac markers negative.   Chest x-ray shows patchy perihilar and lower lobe air space disease  consistent with pneumonia, worse on  the right. Recommend followup with  resolution.   ASSESSMENT/PLAN:  A 70 year old with:  1. Cough/fevers/chills with chest x-ray consistent with bilateral      pneumonia, greater on the right.  2. Given that she has myalgias and symptoms associated with influenza,      we need to rule out H1N1 influenza or influenza type A or B.  3. History of coronary artery disease, congestive heart failure-      compensated.  4. Diabetes type 2.  5. Hyperlipidemia.  6. Chronic history of atrial fibrillation with chronic Coumadin      therapy. Note that her INR was not checked here in the emergency      room.   DISCUSSION AND PLAN:  The patient is hemodynamically stable.  Blood  pressure in 150s.  She is not hypotensive, pulse ox is 97%.  We will go  ahead and admit her to the medical telemetry unit.  Will initiate  antibiotics with Levaquin, and given that she also likely has a urinary  tract infection will get blood, urine, sputum cultures.  We also need to  do H1N1 isolation. Given that she  has typical symptoms of influenza, I  will start treatment with Tamiflu. Given that she has a history of  coronary artery disease, I will go ahead and cycle her cardiac enzymes  and continue her beta blocker. Will have to do an EKG here.  We have to  continue her Coumadin per pharmacy protocol for goal INR of 2 to 3. For  diabetes will continue a sliding scale insulin, will hold her Avandia  for now. The rest of the plans really depend on her progress.      Lucita Ferrara, MD  Electronically Signed     RR/MEDQ  D:  01/20/2008  T:  01/20/2008  Job:  [id number]

## 2010-08-22 NOTE — Consult Note (Signed)
[place]  Patient:    Robin Snow, Robin Snow                     MRN: [id number] Proc. Date: 08/11/99 Adm. Date:  [id number] Disc. Date: [id number] Attending:  Kae Heller Dictator:   Jaclyn Prime. Lucas Mallow, M.D. CC:         Robin Hair. Vaughan Basta., M.D.             Robin Snow, M.D.                          Consultation Report  I appreciate the opportunity to participate in the care of this very nice patient, a 70 year old patient of Dr. Wray Kearns, at his request, in regard to her symptoms of shortness of breath and congestive heart failure.  HISTORY OF PRESENT ILLNESS:  Robin Snow was admitted with dyspnea on May 5. On examination at that time she had bilateral crackles and edema at the ankles, and her weight was 279 pounds.  Chest x-ray showed bilateral air space disease secondary to congestive heart failure.  She says that the onset of her symptoms was very sudden.  She woke up during the night and went downstairs to the bathroom, and was unable to get back up stairs because of severe shortness of breath.  She had no similar symptom previously.  Following admission to the hospital she has been treated with intravenous Lasix, potassium supplements, and a low sodium diet.  She has had considerable improvement in her symptoms since then.  She has been taking Vioxx for arthritis, and developed hives and dyspnea, and had been switched to Celebrex 200 mg daily approximately a week prior to this hospitalization.  PAST MEDICAL HISTORY: 1. History of osteoarthritis. 2. Obesity. 3. High blood pressure. 4. Postmenopausal. 5. Cholecystectomy in 1999. 6. Knee replacement in March 1999.  SOCIAL HISTORY:  She quit smoking 4-5 years ago.  She uses no alcohol, which she also quit at the same time.  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  She has a primary caregiver for her 38 year old grandson and  51 year old son who has seizures.  She is a primary patient of Insurance underwriter.  FAMILY HISTORY:  Her father died of cancer of unknown type in 55.  Her mother died of childbirth.  An older daughter has multiple sclerosis, an aunt has undergone some heart tests, and another aunt has had a pacemaker, but there is no known history of heart attack or congestive heart failure in the family.  MEDICATIONS ON FLOOR: 1. Celebrex 200 mg daily, which had been increased to 200 mg b.i.d. in the    week prior to admission. 2. Lasix 20 mg daily. 3. Atenolol 100 mg daily. 4. Vicodin p.r.n. 5. Premarin 0.625 mg daily.  REVIEW OF SYSTEMS:  CONSTITUTIONAL:  She denies fevers or chills.  She may of had some sweating with the shortness of breath.  There is no claudication.  HEENT:  She wears prescription lenses and has stable vision.  No dizziness or tinnitus.  CARDIOVASCULAR:  See history of present illness.  RESPIRATORY:  See history of present illness.  MUSCULOSKELETAL:  She has a left knee replacement and has generalized joint pain.  GASTROURINARY:  No change in bowel or bladder function.  GASTROINTESTINAL:  No change in bowel function.  PSYCHIATRIC:  No depression or hallucinations.  NEUROLOGIC:  No faintness or syncope.  ALLERGIC/LYMPHATIC:  Intolerance to Vioxx as noted above.  The remainder of the 14 systems in her comprehensive 14 system review are negative.  PHYSICAL EXAMINATION:  VITAL SIGNS:  Blood pressure 120/74, pulse 59, respirations 20.  She is afebrile.  Oxygen saturation is 98% on room air.  GENERAL:  She is a healthy appearing obese elderly woman currently in no acute distress.  HEENT:  Conjunctivae and lids reveal no xanthelasma or icterus.  She is oriented to person, place, and time, and mood and affect are appropriate.  Her palate is normal.  The oral mucosa reveals no pallor or cyanosis.  NECK:  Supple and symmetric.  Trachea is midline and mobile.   There is no palpable thyromegaly, cervical node, or carotid bruit.  RESPIRATORY:  Respiratory effort is normal.  LUNGS:  Clear to auscultation and percussion.  BACK:  Straight and without tenderness.  EXTREMITIES:  Her gait is slow.  She would have difficulty with a standard stress test.  Cardiac apical impulses cryptic.  No clubbing or cyanosis.  HEART:  Regular rate and rhythm.  There is no gallop, click, or murmur.  SKIN:  Subcutaneous tissues reveal no stasis dermatitis or ulcer.  ABDOMEN:  Obese and nontender.  No palpable enlargement of liver or spleen. The abdominal aorta is without bruit.  The femoral arteries are also without bruits.  Pedal pulses are not palpable.  There is trace ankle edema.  LABORATORY DATA:  Unremarkable at this point.  Cardiac enzymes are negative x 2.  Robin Snow has new onset of congestive heart failure and significant risk factors, with poorly controlled hypertension.  In order to fully characterize her risks, she will require cardiac catheterization, and we will request that consultation with [facility].  I appreciated the opportunity to participate in the care of this nice woman. Should her heart catheterization demonstrate no coronary artery disease, then careful attention must be paid to issues of salt restriction, weight loss, adequate doses of medications to control her blood pressure, and avoidance of salt retaining drugs, such as nonsteroidal anti-inflammatory drugs. DD:  08/12/99 TD:  08/13/99 Job: [id number]

## 2010-08-22 NOTE — H&P (Signed)
NAME:  Robin Snow, Robin Snow                        ACCOUNT NO.:  [id number]   MEDICAL RECORD NO.:  [id number]                   PATIENT TYPE:  INP   LOCATION:  NA                                   FACILITY:  [facility]   PHYSICIAN:  Mila Homer. Sherlean Foot, M.D.              DATE OF BIRTH:  March 15, 1941   DATE OF ADMISSION:  06/21/2002  DATE OF DISCHARGE:                                HISTORY & PHYSICAL   CHIEF COMPLAINT:  Chronic right total knee arthroplasty pain.   HISTORY OF PRESENT ILLNESS:  The patient is a 70 year old black female with  a history of a right total knee arthroplasty in 3/03.  The patient indicates  she has been having chronic pain in that knee ever since it was implanted.  She currently has discomfort in the knee with initiation of sitting to  standing and ambulation.  She describes the pain as an intense deep  throbbing pain.  It does swelling significantly.  She does have a  significant increased warmth in the knee, and it significantly makes  activities of daily living very difficult.   X-rays reveal early loosening of the tibial component.   CURRENT MEDICATIONS:  1. Lasix 40 mg p.o. daily.  2. Lanoxin 0.25 mg p.o. daily.  3. Diovan 160 mg p.o. daily.  4. Lopressor 100 mg p.o. b.i.d.  5. Percocet p.r.n.  6. Coumadin 5 mg p.o. daily, expected to stop on 06/17/02.   DRUG ALLERGIES:  VIOXX causing increased palpitations.   PREVIOUS MEDICAL HISTORY:  1. Hypertension, currently not well controlled.  2. History of atrial fibrillation with chronic Coumadin therapy.  3. History of previous admission for CHF.   PAST SURGICAL HISTORY:  1. Cesarean section in 1978.  2. Multiple knee arthroscopies in 1999.  3. Cholecystectomy and gallstones removed in 2000 and 2001.  4. Left total knee arthroplasty in 2002.  5. Right total knee arthroplasty in 2003.  6. The patient denies any complications with anesthesia with any previous     surgical conditions, but she had recurrent  atrial fibrillation on her     last admission postoperatively, and she was placed on Coumadin, being     noncompliant with previous medication.   SOCIAL HISTORY:  The patient is a 70 year old slightly heavy-set black  female.  She denies any history of smoking or alcohol use.  She is widowed.  She has four grown children.  She lives in a one story house.  There are two  steps to the main entrance.  She is currently a retired Agricultural engineer.   Family physician is [facility], Denies Administrator, arts, Publishing rights manager, at [phone].   Cardiology is Dr. Patty Sermons.   FAMILY MEDICAL HISTORY:  Mother is deceased at the age of 64 during  childbirth.  Father is deceased at 30 with prostate cancer.  The patient has  one brother who passed away at 35, and another  alive at the age of 26 with  hypertension.  She has one sister alive at 37 years of age with no medical  issues.  She has healthy grown children.   REVIEW OF SYSTEMS:  Positive for an upper respiratory cold in January with  no residual problems.  She does wear glasses.  She does have upper and lower  denture.  She does have occasional shortness of breath with exertion she  relates to her atrial fibrillation and her weight and her deconditioning.  Otherwise all other review of systems categories at this time are negative  or noncontributory.   PHYSICAL EXAMINATION:  VITAL SIGNS:  Weight is 242 pounds, blood pressure  190/100, pulse 60 and irregular, respirations 12.  GENERAL:  This is a healthy-appearing, well-developed, slightly heavy-set  black female who ambulates with a slow deliberate step.  She is able to get  on and off the exam table without much difficulty.  HEENT:  Head was normocephalic and atraumatic.  Nontender over maxillary or  frontal sinuses.  Pupils equal, round and reactive. accommodating to light.  Extraocular motions are intact.  Sclerae are anicteric.  External ears  without deformities.  Canals are patent.  TMs  pearly gray and intact.  Gross  hearing is intact.  Nasal septum was midline.  Mucous membranes pink and  moist.  Oral buccal mucosa was pink and moist and without lesions.  Upper  and lower dentures were in place.  Oropharynx was without any signs of  erythema or exudate.  The patient is able to swallowing without any  difficulty.  NECK:  Supple.  No palpable lymphadenopathy.  Thyroid gland was nontender.  The patient had good range of motion of her cervical spine without any  difficulty or tenderness.  She had no significant tenderness along the  spinal column with percussion.  CHEST:  Lungs sounds were clear and equal bilaterally.  No wheezes, rales,  rhonchi, or rubs noted.  HEART:  Irregular rate and rhythm.  S1 and S2 with a rate of about 60.  No  other murmurs were noted.  ABDOMEN:  Round, obese.  She was nontender to percussion or palpation.  She  had no hepatosplenomegaly.  Bowel sounds were normoactive throughout.  EXTREMITIES:  Upper extremities were symmetrically sized and shaped.  She  had full range of motion of her shoulders, elbows, and wrists.  Motor  strength was 5/5.  Lower extremities - right and left hip had full  extension, flexion up to 120 degrees, 20 degrees internal and external  rotation without any difficulty or mechanical symptoms.  Left knee had a  well-healed midline surgical incision.  No signs of erythema or ecchymosis.  No palpable effusion.  No general tenderness throughout.  She had full  extension and flexion back to 100 degrees.  No instability noted.  Right  knee had a well-healed midline surgical incision.  There was no sign of  erythema or ecchymosis throughout, but it was slightly increased with warmth  when compared to the left knee.  She was tender along the tibial region.  She had about five degrees short of full extension and flexion back to 90  degrees.  There was about a five degree medical to lateral laxity with valgus varus stressing.   Calves were nontender.  Ankles were swollen but  symmetrical with good dorsi and plantar flexion.  NEUROLOGIC:  The patient was conscious, alert, and appropriate during the  conversation with the examiner.  Cranial nerves II-XII  were grossly intact.  Deep tendon reflexes of the upper and lower extremities were symmetric,  right to left.  The patient was grossly intact to light touch and sensation  from head to toe.  There were no gross neurologic defects noted.  PERIPHERAL VASCULAR:  Carotid pulses were 2+.  No bruits.  Radial pulses 2+.  Unable to palpable posterior tibia or dorsalis pedis pulses due to lower  extremity swelling below the ankles, but they were warm to touch.  She had  no significant varicosities.  She had 1+ edema.  BREAST/RECTAL/GU:  Deferred at this time.   IMPRESSION:  1. 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  2. Uncontrolled hypertension.  3. History of chronic atrial fibrillation on Coumadin therapy.   PLAN:  The patient is expected to leave our office on this date and present  to Dr. Yevonne Pax office for echocardiogram.  Will have him also reevaluate  her blood pressure to see if there are any adjustments needed at this time.  Otherwise the patient will be admitted to [facility] on 06/21/02  under the care of Dr. Georgena Spurling for a planned revision of her right  total knee arthroplasty.  The patient will undergo all of the routine labs  and tests.     Jamelle Rushing, P.A.                      Mila Homer. Sherlean Foot, M.D.    RWK/MEDQ  D:  06/15/2002  T:  06/15/2002  Job:  [id number]

## 2010-08-22 NOTE — Discharge Summary (Signed)
[place]. [facility]  Patient:    Robin Snow, Robin Snow Visit Number: [id number] MRN: [id number]          Service Type: SUR Location: [id number] Attending Physician:  Teena Dunk Dictated by:   Arnoldo Morale, P.A.-C. Admit Date:  06/22/2001 Discharge Date: 06/27/2001   CC:         Thomas A. Patty Sermons, M.D.   Discharge Summary  ADMISSION DIAGNOSES: 1. End-stage osteoarthritis, right knee, status post left knee replacement. 2. Atrial fibrillation. 3. Right rotator cuff tear. 4. Hypertension. 5. History of congestive heart failure.  DISCHARGE DIAGNOSES: 1. End-stage osteoarthritis, right knee, status post left knee replacement. 2. Acute blood loss anemia secondary to surgery. 3. Constipation. 4. Atrial fibrillation. 5. Right rotator cuff tear. 6. Hypertension. 7. History of congestive heart failure.  SURGICAL PROCEDURES: On June 22, 2001, Ms. Diefenderfer underwent as right total knee arthroplasty by W. Su Ley., M.D. assisted by Arnoldo Morale, P.A.-C.  COMPLICATIONS:  None.  CONSULTATIONS: 1. Cardiology, Maisie Fus A. Patty Sermons, M.D., on June 22, 2001. 2. Pharmacy consult for Coumadin therapy. 3. Physical therapy. 4. Case management on June 23, 2001. 5. Occupational therapy on June 25, 2001.  HISTORY OF PRESENT ILLNESS:  This 70 year old black female patient presented to Dr. Madelon Lips with a history of a left knee replacement by him in 1999. Since that time, right knee pain has progressed and become constant since about September 2001.  The pain in her right knee is described as constant, sharp, and stabbing, located diffusely about the joint without radiation.  It increases with ambulation, and nothing relieves it.  There is a moderate amount of stiffness after any activity, and the pain keeps her up at night. She has failed conservative treatment at this time and wishes to have a right knee replacement.  HOSPITAL COURSE:   Ms. Nessel tolerated her surgical procedure well without immediate postoperative complications.  She was subsequently transferred to telemetry.  Dr. Patty Sermons saw her at that time and initiated Coumadin therapy for atrial fibrillation since she had been noncompliant in the past.  He followed her closely throughout her hospital.  On postop day #1, T-max. was 100.1.  Vital signs were stable.  Blood pressure was elevated at 160/98.  Her pain was well controlled with a PCA.  Hemoglobin 10.3, hematocrit 29.9.  Her leg was neurovascularly intact.  P.O. pain medications were added to her pain regimen, and she was started on PT per protocol.  On postop day #2, T-max 100.8, vital signs stable.  Her right knee incision was well approximated with staples.  Hemoglobin 9.8, hematocrit 28.3.  She did complain of some constipation, and that was treated with MiraLax.  She was switched to p.o. pain medications and continued on therapy.  She continued to do well over the next several days.  Medications were adjusted accordingly per Dr. Patty Sermons.  She did have good relief of her constipation and made good progress with physical therapy.  On June 27, 2001, she was cleared for discharge per cardiology and was doing well enough from an orthopedic standpoint that she was able to be discharged home.  DIET:  She can resume regular prehospitalization diet.  She is to avoid extra added salt.  DISCHARGE MEDICATIONS:  She can resume her prehospitalization medications except for no more atenolol, and she is not to use the Darvocet while she is on the OxyContin.  Her only additional home medications that she came in with preoperatively were: 1.  Lasix 40 mg 1 tablet p.o. q.d. 2. Coumadin 5 mg 1 tablet p.o. q.d. 3. Lopressor 100 mg 1 tablet p.o. b.i.d. 4. Lanoxin 0.25 mg 1 tablet p.o. q.d.; hold for pulses less than 60. 5. OxyContin 20 mg 1 tablet p.o. q.12h.  Do not crush or chew the tablet, #22    with no  refill. 6. Percocet 5/325 mg 1 to 2 tablets p.o. q.4h. p.r.n. pain, #50 with no    refill.  ACTIVITY:  She is to be out of bed, partial weightbearing 50% or less on the right leg with the use of a walker.  Home health physical therapy is arranged per [place].  WOUND CARE:  She is to keep her right knee incision clean and dry, may shower after no drainage from the wound for two days.  Home health RN is arranged to draw her pro times every Monday and Thursday and to call the results to Dr. Jenness Corner office at [phone] or his FAX number [phone].  She is to notify Dr. Madelon Lips of temperature greater than or equal to 101.5, chills, pain unrelieved by pain medication, or foul-smelling drainage from the wound.  FOLLOWUP:  She needs to follow up with Dr. Madelon Lips in our office on Thursday, July 07, 2001, and needs to call [phone] to set up that appointment.  She needs to follow up with Dr. Patty Sermons in his office in approximately two to three weeks and needs to call for that appointment at [phone].  LABORATORY DATA:  June 23, 2001, hemoglobin 10.3, hematocrit 29.9, platelets 141.  March 21, hemoglobin 9.8, hematocrit 28.3, platelets 108.  March 22, hemoglobin 8.9, hematocrit 26, platelets 124.  June 26, 2001, white count 8, hemoglobin 9.6, hematocrit 28.4, and platelets 156.  On March 20, PT 14.9, INR 1.2.  March 24, PT 23.4, INR 2.7.  On March 20, glucose 152.  On March 21, glucose 155, BUN 4, creatinine 0.8. She has A positive blood with a negative antibody screen.  All other laboratory studies were within normal limits. Dictated by:   Arnoldo Morale, P.A.-C. Attending Physician:  Teena Dunk DD:  07/04/01 TD:  07/04/01 Job: [id number]

## 2010-08-22 NOTE — H&P (Signed)
[place]. [facility]  Patient:    Robin Snow, Robin Snow                  MRN: [id number] Adm. Date:  05/05/00 Attending:  Sharlot Gowda., M.D. Dictator:   Jamelle Rushing, P.A.-C.                         History and Physical  DATE OF BIRTH:  [birth year]  CHIEF COMPLAINT:  Right knee pain since 1999.  HISTORY OF PRESENT ILLNESS:  The patient is a 70 year old black female with a history of a left total knee arthroplasty in 1999.  The patient at that time started developing right knee pain, which would come and go.  The patient stated that her pain became chronic and constant around September 2001.  The patient currently describes the pain as a sharp stabbing sensation at its worst, and otherwise a constant aching sensation, primarily located in the knee.  The pain worsens with any ambulation.   The patient has significant stiffness in the knee after periods of immobility.  The patient does have pain at night.  She does note popping.  It has given out on her, and she does have a significant amount of swelling at times.  The pain does radiate down into the calf when it is at its worst.  The patient has tried Synvisc injections in the past, without any improvement.  The patient is currently not using any assistive device.  ALLERGIES:  VIOXX causing palpitations.  CURRENT MEDICATIONS: 1. Axid 150 mg p.o. b.i.d. 2. Vicodin one or two tablets q.4-6h. p.r.n. pain. 3. Atenolol 50 mg p.o. q.d. 4. PremPro one tablet p.o. q.d. 5. Lasix 20 mg p.o. q.d.  PAST MEDICAL HISTORY: 1. The patient has been diagnosed with hypertension for the last 24 years.    She had the atenolol readjustment a little bit better than two years ago.    The patient states her blood pressures have been fairly stable on the    last evaluations. 2. The patient was diagnosed with congestive heart failure after a    hospitalization in May 2001, for chest pain and shortness of breath.   The patient indicates she was evaluated and treated by a cardiologist    named Dr. Merril Abbe.  I think she was confused with Dr. Barbette Hair.    MacArthur, the internal medicine physician.  The patient denies any history of diabetes mellitus, thyroid disease, hiatal hernia, peptic ulcer, or asthma.  PAST SURGICAL HISTORY: 1. A C-section in 1978, by Dr. Delories Heinz. 2. Total knee replacement of the left knee in 1999, by Dr. Marcie Mowers. 3. Gallstone and bladder removal in 1999.  The patient denies any complications with any of the above-mentioned surgical procedures.  SOCIAL HISTORY:  The patient is a 70 year old pleasant, moderately-heavyset black female, who denies any history of smoking or alcohol use.  The patient is widowed.  She does have three grown children.  She lives in a one-story house and she is not currently employed.  FAMILY PHYSICIAN:  Listed as [facility] at [phone].  FAMILY HISTORY:  Mother deceased during childbirth.  Father deceased at age 29 from prostate cancer.  The patient has one brother who was killed.  One brother alive, with hypertension.  One sister alive, in good health.  REVIEW OF SYSTEMS:  Positive for chest pain and shortness of breath, with the hospitalization in May 2001, with  the diagnosis of CHF.  Since that time the patient has lost 41 pounds, with a diet intentionally.  The patient does have upper and lower dentures.  Otherwise the review of systems is negative and noncontributory for any other respiratory, sensory, GI, GU, hematological, sensory, general, neurological, or mental status problems at this time.  PHYSICAL EXAMINATION:  VITAL SIGNS:  Weight 250 pounds, height 5 feet 7 inches, pulse 72 pounds, respirations 12, temperature 98.8 degrees, blood pressure 148/92.  GENERAL:  This is a healthy-appearing, slightly-obese black female.  She does ambulate without any significant limp.  She is able to get on and off of the examination  table fairly easily, without significant discomfort.  HEENT:  Head normocephalic, atraumatic, nontender over the maxillary or frontal sinuses.  Pupils equal, round, reactive to light and accommodation. Sclerae anicteric.  Conjunctivae slightly pale.  Extraocular movements intact. External ears without deformities.  Canals patent.  Tympanic membranes pearly gray and intact.  Gross hearing intact.  Oral buccal mucosa is slightly pale, without lesions, moist.  Uvula is midline, moves symmetrically with phonation. The patient is able to swallow without any difficulty.  Dentures are in place, upper and lower.  Nasal septum midline.  Mucous membranes slightly pale but moist.  No polyps noted.  NECK:  Supple, no palpable lymphadenopathy.  Thyroid gland was nontender.  The patient had a good range of motion of her cervical spine without any difficulty or tenderness.  CHEST:  The patient was slightly humpbacked.  She had clear and equal lung sounds in all fields, with no signs of wheezing, rales, or rhonchi.  HEART:  A regular rate and rhythm, with S1, S2 auscultated.  No murmurs, rubs, or gallops noted.  ABDOMEN:  Round, obese.  Unable to palpate hepatosplenomegaly.  She was nontender to deep percussion and palpation.  Bowel sounds were present throughout and were normoactive.  CVA was nontender.  EXTREMITIES:  Upper extremities were symmetric in size and shape.  Good range of motion of her shoulders, elbows, and wrists, with 5/5 motor strength bilaterally.  Lower extremities:  Right and left hips had 0-120 degrees range of motion with flexion and extension, 20-30 degrees range of motion with internal and external rotation.  There were no mechanical symptoms and no discomfort.  Right knee with no sign of erythema or ecchymosis about the knee. There is no palpable effusion.  The patient did have lateral joint line tenderness.  The patient had a significant amount of crepitus under  the patella with range of motion, which was 10-90 degrees.  The patient had about 20-degree valgus deformity.  There was no anterior or posterior drawer.  Calf  was nontender.  Left knee had a well-healed midline surgical incision.  There was no sign of erythema or ecchymosis.  No palpable effusion.  The patient had full extension and flexion back to 90 degrees.  There was a slight mechanical clunk with range of motion in valgus or varus stressing, but otherwise the knee was stable.  The calf was nontender.  The right and left ankles were symmetrical.  They had a moderate amount of lower extremity edema which was nonpitting.  She had good dorsiflexion and plantar flexion bilaterally.  PERIPHERAL VASCULAR:  Carotid pulses 1+, no bruits.  Radial pulses were 2+. Femoral pulses:  Unable to palpate due to obesity.  Dorsalis pedis pulses: Were 1+ bilaterally.  Posterior tibial pulses were unable to palpate due to edema.  NEUROLOGIC:  The patient was conscious, alert, and  appropriate, and held an easy conversation with the examiner.  Cranial nerves II-XII were grossly intact.  Deep tendon reflexes of the upper and lower extremities were symmetrical.  The patient was grossly intact to light touch sensation from head to toe.  BREASTS/RECTAL/GU:  Examinations deferred at this time.  IMPRESSION: 1. End-stage osteoarthritis, right knee. 2. Status post left total knee arthroplasty. 3. History of congestive heart failure. 4. Hypertension.  PLAN:  The patient will be admitted to [facility] on May 05, 2000, under the care of Dr. Madelon Lips.  The patient has donated 3 units of autologous blood for this upcoming right total arthroplasty.  The patient will undergo all other routine labs and tests for this surgical procedure. DD:  04/29/00 TD:  04/29/00 Job: [id number]

## 2010-08-22 NOTE — Consult Note (Signed)
[place]. [facility]  Patient:    Robin Snow, Robin Snow                     MRN: [id number] Proc. Date: 08/11/99 Adm. Date:  [id number] Attending:  Kae Heller Dictator:   Donzetta Matters, P.A. CC:         Jacqualin Combes. Vaughan Basta., M.D.                          Consultation Report  DATE OF BIRTH:  January 14, 1941  ATTENDING PHYSICIAN:  Barbette Hair. Vaughan Basta., M.D.  CHIEF COMPLAINT:  Shortness of breath.  HISTORY OF PRESENT ILLNESS:  This 70 year old female patient of Dr. Merril Abbe was admitted with dyspnea on Aug 09, 1999.  Lung exam on admission showed bilateral crackles and 1+ edema at the ankles.  Weight on admission was 278.6.  Chest x-ray showed bilateral airspace disease secondary to congestive heart failure.  She has been treated with Lasix IV as well as potassium supplements and low sodium diet. Her weight continues to remain stable at 278 pounds today.  She did have a episode of dyspnea yesterday, and cardiac consult was called.  A 2-D echocardiogram has  been done and the results are pending.  On reviewing her story, she has switched from Vioxx which caused hives and dyspnea, and is presently taking Celebrex 200 mg daily for her arthritis.  She has had increase of the medication to two times a day last week.  She describes this as the first episode of dyspnea like this.  She as never been hospitalized for episodes of this type before.  States that when this occurred, she had felt cold and sweaty but no nausea, and she had shortness of breath walking up the stairs which has been new for her.  OTHER MEDICAL HISTORY:  She has had history of arthritis and high blood pressure. She is post menopausal.  SURGICAL HISTORY:  Includes a cholecystectomy in 1999, also a knee replacement n March 1999.  SOCIAL HISTORY:  States she quit smoking four to five years ago.  Does not drink alcohol, stopped doing that at  the same time.  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  Also has been primary caregiver for a 57 year old grandson and also a 66 year old son that has seizures, so she  does have multiple stressors in her life.  FAMILY HISTORY:  Father died of unknown type of cancer in 82.  Mother died in  childbirth.  Gives history of her older daughter being diagnosed with MS.  She lso had an aunt that had undergone some heart tests, and another aunt that had a pacemaker, but no other history of heart attacks or other coronary artery disease in the family.  MEDICATION ALLERGIES:  VIOXX, which causes hives and dyspnea.  MEDICATIONS ON ADMISSION:  Celebrex 200 mg once daily, in the past week has been increased to two times a day; Lasix 20 mg daily; atenolol 100 mg daily; Vicodin  p.r.n.; Premarin 0.625 mg daily.  REVIEW OF SYSTEMS:  States she has been treated for high blood pressure and has  been tolerating her blood pressure medications.  This is the first episode she as had of shortness of breath, especially with exertion.  Denies any bleeding problems.  She has  had no weakness or numbness, just shortness of breath.  Has een eating well.  No change in bowel or bladder function.  Continues to have some generalized joint pain, which she attributes to arthritis, especially in both of her knees.  She does have the left knee replacement.  Does wear prescription lenses and states her vision has been stable.  She has had no change in appetite nor increased weight or loss of weight.  She has had some ankle swelling, especially when she is up on her feet a lot.  PHYSICAL EXAMINATION:  VITAL SIGNS TODAY:  Blood pressure 120/74, pulse 59, respirations 20.  She is afebrile.  Her O2 saturation is 98% on room air.  GENERAL:  This is a very healthy-appearing older female, at the present time resting comfortably.  Has been moving easily in the  room.  HEENT:  Pupils are equal, extraocular movements intact, does have prescription lenses on.  Mouth is pink, moist mucosa.  NECK:  Supple.  There are no bruits, no thyromegaly, no adenopathy.  CHEST:  Clear with no crackles at the bases at this exam.  HEART:  Regular rate and rhythm, somewhat slow rate, no obvious murmur.  Heart ize is normal.  ABDOMEN:  Soft, active bowel sounds, no hepatosplenomegaly, no bruits.  EXTREMITIES:  No ankle edema is noted today.  She has good dorsalis pedis pulses.  NEUROLOGIC:  Grossly nonfocal.  Speech is clear.  There is no resting tremor.  LABORATORY DATA:  Reviewed and shows that her sodium today is 141 with potassium at 3.8, chloride 102, CO2 31, glucose 100, BUN 14, creatinine 0.9, calcium 9.2. She has had cardiac enzymes x 2 that are negative.  TSH normal at 2.38.  CBC shows white count at 6900, hemoglobin 12.5, hematocrit 36.5, platelet count 182. Blood gas on admission did show, on room air, a pH of 7.448, PCO2 36.5, PO2 68.0.  O2  saturation was 94%.  IMPRESSION:  Chest pain with new-onset congestive heart failure.  Cardiac enzymes negative.  TSH normal.  She does have a pending 2-D echocardiogram.  Have checked for records at the office.  She has none available at our office.  She gets regular health care with [facility].  She states she has had no heart testing, no previous heart catheterizations.  I discussed the options.  Also discussed heart catheterization with the patient and she is agreeable.  This has been scheduled  with Dr. Alanda Amass, orders have been written.  Will also request a PT and PTT to be done today to complete her plans for heart catheterization in the a.m. DD: 08/11/99 TD:  08/11/99 Job: [id number]

## 2010-08-22 NOTE — Discharge Summary (Signed)
NAME:  Robin Snow, Robin Snow                        ACCOUNT NO.:  [id number]   MEDICAL RECORD NO.:  [id number]                   PATIENT TYPE:  INP   LOCATION:  5036                                 FACILITY:  [facility]   PHYSICIAN:  Mila Homer. Sherlean Foot, M.D.              DATE OF BIRTH:  1941/01/23   DATE OF ADMISSION:  06/21/2002  DATE OF DISCHARGE:  06/26/2002                                 DISCHARGE SUMMARY   ADMISSION DIAGNOSES:  1. 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  2. Uncontrolled hypertension.  3. History of chronic atrial fibrillation, on Coumadin therapy.   DISCHARGE DIAGNOSES:  1. Revision of right total knee arthroplasty.  2. Postoperative blood loss anemia.  3. Trichomoniasis.  4. Hypertension.  5. History of chronic atrial fibrillation with Coumadin therapy.   HISTORY OF PRESENT ILLNESS:  The patient is a 70 year old black female with  a history of right total knee arthroplasty in March of 2003.  The patient  states that she has been having chronic pain since the procedure.  She  indicates a throbbing pain with initiation of sitting to standing and with  ambulation.  She does have increased warmth within the knee and swelling.   X-rays reveal early loosening of the tibial component.   ALLERGIES:  VIOXX, causing palpitations.   CURRENT MEDICATIONS:  1. Lasix 40 mg p.o. daily.  2. Lanoxin 0.25 mg p.o. daily.  3. Diovan 160 mg p.o. daily.  4. Lopressor 100 mg p.o. b.i.d.  5. Percocet p.r.n.  6. Coumadin 5 mg p.o. daily -- will stop on June 17, 2002.   SURGICAL PROCEDURE:  On June 21, 2002, the patient was taken to the  operating room by Dr. Mila Homer. Lucey, assisted by Jamelle Rushing, P.A.  Under general anesthesia, the patient underwent a revision of her right  total knee arthroplasty.  Intraoperative tissue samples indicated that there  were no signs of infection.  She had complete revision of all components of  her  right total knee arthroplasty.  The patient tolerated the procedure  well.  There were no complications.  She received a postoperative femoral  nerve block prior to being discharged to the recovery room and then to the  orthopedic floor in good condition.   CONSULTS:  1. Routine consults were requested -- physical therapy, occupational     therapy, rehab, case management.  2. Pharmacy consult was requested for managing the patient's Coumadin     therapy.   HOSPITAL COURSE:  On June 21, 2002, the patient was admitted to [facility] under the care of Dr. Georgena Spurling.  The patient was taken to the  OR where a revision of all components of her right total knee arthroplasty  was performed.  Intraoperative tissue samples indicated no signs of obvious  infection.  All components were revised.  Patient tolerated  the procedure  well.  She received a postoperative femoral nerve block for assistance with  pain management.  She was started on Coumadin for routine DVT prophylaxis  and her chronic atrial fibrillation.   The patient then incurred a total of five days' postoperative care on the  orthopedic floor in which the patient worked well with physical therapy.  Her atrial fibrillation was well-controlled ventricular rhythm.  Otherwise,  her vital signs were stable.  Her blood pressure was fairly well-controlled.  She had the typical occasional postoperative low-grade temperatures.   On postop day #2, the nurse noted some greenish, foamy discharge from the  vaginal region while discontinuing the Foley.  A swab was performed and then  sent to the lab for evaluation.  She was diagnosed with Trichomoniasis and  was given the appropriate one dose of Flagyl for this.   The patient worked well with physical therapy and was felt on postop day #5  with her INR of 2.3, the wound benign for any signs of infection, patient's  discomfort from her knee was significant improved from prior  surgery, she  was discharged home with routine outpatient protocol for total knee  replacement with home health physical therapy.   LABORATORY DATA:  EKG on admission was atrial fibrillation with a competing  junctional pacer, nonspecific T wave abnormality, probably digitalis effect,  at 64 beats per minute.   CBC on June 24, 2002:  WBC was 7.7, hemoglobin 9.0, hematocrit 26.3,  platelets of 136,000.  INR on June 26, 2002 was 2.3.  Routine chemistries  on June 23, 2002:  Sodium of 138, potassium of 3.6, glucose 122, down from  a high of 131, with a preop of 94; this was felt to be a routine postop  stress.  BUN 5, creatinine 0.8.  Urinalysis on admission found 30 protein,  wbc's 21 to 50, rbc's 11 to 20 and few bacteria.  Wound cultures and Gram's  stains intraoperatively showed rare wbc's, predominantly mononuclear, no  organisms noted, no growth.   MEDICATIONS UPON DISCHARGE FROM ORTHOPEDIC FLOOR:  1. Lasix 40 mg p.o. daily.  2. Lanoxin 0.25 mg p.o. daily.  3. Avapro 300 mg p.o. daily.  4. Lopressor 100 mg p.o. b.i.d.  5. Colace 100 mg p.o. b.i.d.  6. Trinsicon one tablet p.o. t.i.d.  7. Laxative or enema of choice p.r.n.  8. Percocet one or two tablets every four to six hours p.r.n.  9. Tylenol 650 mg p.o. q.4h. p.r.n.  10.      Robaxin 500 mg p.o. q.6h. p.r.n.  11.      Restoril 15 mg p.o. q.h.s. p.r.n.  12.      Coumadin 5 mg p.o. daily.  13.      Potassium chloride 20 mEq p.o. b.i.d.  14.      Calcium carbonate 500 mg p.o. b.i.d.  15.      Flagyl 2 g x1 dose.   DISCHARGE INSTRUCTIONS:   MEDICATIONS:  1. The patient may resume home medications.  2. Percocet 5 mg one to two tablets every four to six hours for pain.  3. Coumadin 5 mg tablet -- one-half tablet daily unless changed by     physician.   ACTIVITY:  Activity as tolerated with the use of a walker.   DIET:  No restrictions.   WOUND CARE:  Keep the wound clean and dry.  Check daily for any signs  of infection.  If any questions, call Dr. Tobin Chad office.  SPECIAL INSTRUCTIONS:  CPM 0 to 90 degrees for 8 hours a day, increase by 5  degrees up to maximum amount of 115.   FOLLOWUP:  Call [phone] for a followup appointment with Dr. Sherlean Foot in two  weeks.   CONDITION ON DISCHARGE:  The patient's condition upon discharge to home is  listed as improved and good.         Jamelle Rushing, P.A.                      Mila Homer. Sherlean Foot, M.D.    RWK/MEDQ  D:  08/04/2002  T:  08/04/2002  Job:  [id number]   cc:   Lenard Lance, F.N.P.

## 2010-08-22 NOTE — Discharge Summary (Signed)
[place]. [facility]  Patient:    Robin Snow, Robin Snow                     MRN: [id number] Adm. Date:  [id number] Disc. Date: [id number] Attending:  Kae Heller                           Discharge Summary  DISCHARGE DIAGNOSES: 1. Congestive heart failure. 2. Hypertension. 3. Arthritis.  HISTORY OF PRESENT ILLNESS:  Ms. Herst was admitted with acute shortness of breath with evidence of pulmonary edema on chest x-ray.  It did happen to her very acutely and suddenly.  Patient has been on an increased dose of Celebrex for the last several days prior to admission to the hospital.  HOSPITAL COURSE:  Within 24 hours of admission and treatment with diuretics as well as O2 supplementation, she had marked improvement in her symptomatology. Unfortunately, 24 hours after admission, she had a mild, brief episode of dyspnea on exertion which did resolve spontaneously.  She had previously ruled out for a myocardial infarction.  However, due to the sudden onset of shortness of breath acutely after initial resolution, a cardiac evaluation was obtained and she had coronary angiography performed which did not show any significant coronary lesions at that time.  There was nothing suggestive of any major or tight occlusion.  She had an ejection fraction of 70%.  Patient continued to do well without any further shortness of breath or dyspnea and was able to be discharged home in good condition.  DISCHARGE MEDICATIONS: 1. Celebrex 200 mg once daily. 2. Lasix 20 mg once daily. 3. Atenolol 100 mg once daily. 4. Vicodin p.r.n. 5. Premarin 0.625 mg p.o. q.d.  FOLLOW-UP:  She was to follow up with Dr. Crista Luria at [facility] with an appointment arranged one week after discharge from hospital.  CONDITION ON DISCHARGE:  Patient was discharged in good condition with complete resolution of her symptomatology, with normalized chest x-ray at that time. DD:   08/27/99 TD:  08/30/99 Job: [id number]

## 2010-08-22 NOTE — Cardiovascular Report (Signed)
[place]. [facility]  Patient:    Robin Snow, Robin Snow                     MRN: [id number] Proc. Date: 08/12/99 Adm. Date:  [id number] Disc. Date: [id number] Attending:  Kae Heller CC:         Richard A. Alanda Amass, M.D.             Patient chart             Record Room             CP Lab.             Barbette Hair. Vaughan Basta., M.D.                        Cardiac Catheterization  PROCEDURE:  Retrograde central aortic catheterization, selective coronary angiography by Judkins technique, left ventricular angiogram in RAO and LAO projection.  Abdominal aortic angiogram midstream PA projection.  INDICATIONS FOR PROCEDURE:  This very pleasant 70 year old African-American, mother of five living children and seven grandchildren, is raising one of her grandchildren.  She is a remote smoker.  She has a history of hypertension and is seen at [facility] for medical care.  She was admitted to the hospital with symptoms and x-ray compatible with congestive heart failure, normal renal function, myocardial infarction rule out by serial enzymes and EKGs. Admitting medications were atenolol 100 and Lasix 20 q.d.  Mild hypokalemia was treated successfully.  Catheterization was done to assess the LV function and assess the etiology of her congestive heart failure and rule out coronary etiology.  DESCRIPTION OF THE PROCEDURE:  A catheterization was done with #6 French, 4 cm taper preformed coronary and pigtail catheters through a #6 French short side-arm sheath that was placed in the RFA using modified Seldinger technique with a single anterior puncture using an 18 thin-walled needle.  Visipaque was dye was used throughout the procedure.  LV angiogram was done with a pigtail #6 French Cordis catheter at 25 cc, 14 cc/sec RAO and 20 cc, 12 cc/sec LAO projection.  Pullback pressure of his CA was performed and showed no gradient across the aortic valve.  Abdominal  aortic angiogram was done in the midstream PA projection at 30 cc, 20 cc/sec.  The catheter was removed.  Side-arm sheath was flushed.  The patient reported to the holding area for sheath removal and pressure hemostasis following ACT measurement.  She was also given 10 mm of labetalol IV for hypertension on leaving the laboratory.  She tolerated the procedure well.  PRESSURES:  LV:  185/0; LVEDP 166 mmHg.  CA:  185/87 mmHg.  There is no gradient across the aortic valve on catheter pullback.  Fluoroscopy did not reveal and coronary, intracardiac, or valvular calcification.  LEFT VENTRICULAR ANGIOGRAM:  LV angiogram in the RAO and LAO projection showed a vigorous hypercontractile left ventricle with moderate anterior graphic LVH. No significant mitral regurgitation and EF approximately 70-75%.  The main left coronary artery was normal.  The left anterior descending artery was widely patent and smooth throughout its course.  Its coursed the apex before it bifurcated and was essentially normal giving off a normal first septal perforator followed by a moderately large first diagonal branch in the proximal third.  The circumflex artery was a codominant vessel with a normal first and second marginal that were widely patent and smooth and a normal distal vessel and PABG  branch.  The right coronary was a dominant vessel which was widely patent and smooth throughout and moderately large with a normal PDA and PLA distally and RV branches in the midportion.  There was no coronary spasm seen.  DISCUSSION:  History is as outlined above.  Most likely the patients recent episode of congestive heart failure is related to her systemic hypertension and probable associated diastolic dysfunction.  I would recommend medical therapy of her systemic hypertension.  Consideration for ACE inhibitors along with beta blockers and diuretics and vasodilators if necessary.  The patient has normal renal  arteries ruling a renovascular component to her hypertension.  CATHETER DIAGNOSES: 1. Recent onset episode of congestive heart failure. 2. Normal coronary arteries. 3. Normal systolic function, possible diastolic dysfunction. 4. Remote smoker. 5. Systemic hypertension. DD:  08/12/99 TD:  08/13/99 Job: [id number]

## 2010-08-22 NOTE — Op Note (Signed)
[place]. [facility]  Patient:    Robin Snow, Robin Snow Visit Number: [id number] MRN: [id number]          Service Type: SUR Location: 4700 4731 [id number] Attending Physician:  Teena Dunk Dictated by:   Sharlot Gowda., M.D. Proc. Date: 06/22/01 Admit Date:  06/22/2001                             Operative Report  PREOPERATIVE DIAGNOSIS:  Severe osteoarthritis of right knee with valgus deformity and flexion contracture.  POSTOPERATIVE DIAGNOSIS:  Severe osteoarthritis of right knee with valgus deformity and flexion contracture.  OPERATION PERFORMED:  Right total knee replacement (LCS cemented with standard plus femur, patella, 12 mm bearing and size 3 tibia).  SURGEON:  Sharlot Gowda., M.D.  ASSISTANT:  Arnoldo Morale, P.A.  TOURNIQUET TIME:  One hour 30 minutes.  ANESTHESIA:  DESCRIPTION OF PROCEDURE:  Sterile prep and drape.  Exsanguination of the leg and placement of tourniquet 400 mHg.  Straight skin incision medial parapatellar approach to the knee revealed a severely degenerative knee, pronounced valgus wear in the knee, valgus deformity about 10 degrees with flexion deformity of about the same.  A medial capsulotomy was made following by cutting the tibia and eventually the tibia was cut on two subsequent ____________ to get more motion.  This was cut probably 3 mm below the most diseased lateral compartment, sizing the femur followed by cutting the distal anterior and posterior femoral cut, flexion gap was eventually corrected to be 12.5 mm deepened with extension gap at the same.  Distal 4 degree valgus femoral cut followed by posterior chamfers, keel hole for the prosthesis with resection of a significant amount of bone off the posterior aspect particularly on the lateral side of the femur and release of the capsule off the femur as well to correct the flexion contracture.  The tibia was cut using a keel type design  prosthesis size 3 tibia trial reduction carried out.  There was moderate tightness on the lateral side with tendency to bearing spin out. This led to an increase in bearing thickness from 10 to 12.5.  Release of a portion of the lateral collateral ligament complex including a recession type release of the popliteus tendon off the tibia.  This corrected the tendency for bearing spinout nicely.  The patella was cut using a free hand technique due to extreme thinness of the patella leaving about 12 to 13 mm of native patella was used.  Once the knee was balanced and release of the posterolateral structure, it was necessary to do a formal lateral release but again it would be noted that the posterolateral structures were released. This was probably due more to the flexion contracture that was present.  Trial components were removed.  Bony surfaces were irrigated with a pulse lavage. Cement was inserted with 750 mg of Cefurox per bag of cement. Cement was allowed to harden.  Excess cement was removed.  Again motion parameter stability was deemed to be excellent with no tendency to bearing spinout, flexion to 110 degrees without any difficulty and full extension on the table with good balancing of ligaments.  Closure was effected with #1 Ethibond on the capsule, 2-0 Vicryl, skin clips and then Dr. Michelle Piper was to come in to perform I believe a postoperative femoral nerve block.  The patient was taken to the recovery room in stable condition. Dictated by:  Sharlot Gowda., M.D. Attending Physician:  Teena Dunk DD:  06/22/01 TD:  06/23/01 Job: [phone]

## 2010-08-22 NOTE — H&P (Signed)
[place]. [facility]  Patient:    Robin Snow, Robin Snow                       MRN: [id number] Adm. Date:  08/09/99 Attending:  Barbette Hair. Vaughan Basta., M.D.                         History and Physical  DATE OF BIRTH:  1940-05-28  CHIEF COMPLAINT:  Shortness of breath.  HISTORY OF PRESENT ILLNESS:  Ms. Farias is a 70 year old black female who has a history of hypertension and arthritis who presents with sudden onset of shortness of breath this morning when she got up to go to the bathroom at 2 a.m.  She reports that she was not having any chest pain or coughing at that time.  No fevers, chills, sweats.  No nausea and no diaphoresis.  She reports that she may have felt somewhat clammy but no other unusual symptoms.  She says she feels reasonably well right now.  She has no difficulty in answering questions and has no difficulty in talking with shortness of breath.  REVIEW OF SYSTEMS:  Again, no chest pain or palpitations.  No orthopnea, no coughing, no nausea, vomiting, diarrhea.  No change in bowels or blood in stool.  No urinary symptoms.  No musculoskeletal symptoms which are new.  She does have chronic arthritis.  No skin rash or moles of concern.  No recent weight gain or weight loss.  No night sweats, fevers, chills.  No depression, anxiety, or sleep disturbance.  CURRENT MEDICATIONS: 1. Celebrex 200 mg once daily, in the last one week increased to 2 a day. 2. Lasix 20 mg daily. 3. Atenolol 100 mg daily. 4. Vicodin p.r.n. 5. Premarin 0.625 mg p.o. q.d.  ALLERGIES:  VIOXX which causes hives and dyspnea.  PAST SURGICAL HISTORY:  Cholecystectomy in 1999.  SOCIAL HISTORY:  The patient quit smoking four to five years ago, does not drink alcohol, quit about the same time.  She works as a Comptroller on weekends.  FAMILY HISTORY:  Father died of unknown type of cancer in 29.  Mother died of childbirth.  Her children are healthy.  PHYSICAL  EXAMINATION:  VITAL SIGNS:  Blood pressure 140/90, pulse 90, respiratory rate 24.  She is afebrile.  LUNGS:  Bilateral crackles.  HEART:  Regular rate and rhythm.  ABDOMEN:  Soft, nontender, nondistended.  Positive bowel sounds.  EXTREMITIES:  No clubbing, no cyanosis.  She has 1+ edema in her feet bilaterally.  LABORATORY DATA:  CBC: WBC 6.9, hemoglobin 12.5, hematocrit 36.5, platelet count 182,000.  Basic metabolic panel is essentially normal, slight elevation in glucose at 135.  CK and troponin levels were normal.  ASSESSMENT/PLAN: 40. A 70 year old female presents in mild congestive heart failure, etiology is    unclear.  This perhaps is related to her recent increase in Celebrex to    100 mg p.o. b.i.d.  We will rule her out for an myocardial infarction.  Her    initial enzymes are normal.  We will diurese her and monitor her.  She will    probably be able to be discharged home within 24 hours. 2. Hypertension under good control at present. 3. Arthritis.  Will give p.r.n. pain relief with Tylenol for now. DD:  08/09/99 TD:  08/09/99 Job: [id number]

## 2010-08-22 NOTE — H&P (Signed)
[place]. [facility]  Patient:    Robin Snow, Robin Snow Visit Number: [id number] MRN: [id number]          Service Type: SUR Location: 4700 4731 02 Attending Physician:  Teena Dunk Dictated by:   Arnoldo Morale, P.A. Admit Date:  06/22/2001                           History and Physical  DATE OF BIRTH:  1940/12/27  CHIEF COMPLAINT:  Right knee pain since 1999.  HISTORY OF PRESENT ILLNESS:  This 70 year old black female patient presented to Dr. Lacretia Nicks. Frederico Hamman, Montez Hageman. with a history of a left knee replacement by Dr. Madelon Lips in 1999.  Since that time, she has developed right knee pain, which initially was intermittent.  The pain has become chronic and constant since about September of 2001.  She was scheduled initially to have her right knee replaced in January of 2002 but was diagnosed with atrial fibrillation and had to have a cardiac workup and then had a death in her family, so her surgery was postponed until now.  At this point, the pain is constant, sharp and stabbing, otherwise an aching sensation located diffusely about the joint without radiation.  The pain increases with any ambulation and nothing really relieves it.  She complains of a moderate amount of stiffness after any inactivity.  Pain does keep her up at night and the knee does pop at times.  The knee has also given way on her and she has a moderate amount of swelling at times.  Cortisone shots and Synvisc injections have failed to improve her pain and she is currently not taking any anti-inflammatories.  ALLERGIES:  VIOXX caused palpitations.  CURRENT MEDICATIONS: 1. Atenolol 100 mg one tablet p.o. q.d. 2. Lasix 40 mg one tablet p.o. q.d. 3. Darvocet-N 100 one to two tablets p.o. q.4-6h. p.r.n. for pain.  PAST MEDICAL HISTORY:  She was diagnosed with hypertension approximately 25 years ago; she has had that adjusted in the past.  She was diagnosed with congestive heart  failure after being hospitalized in May 2001 for chest pain and shortness of breath; she was treated by Dr. Barbette Hair. Vaughan Basta. at that time.  She also saw Dr. Maisie Fus A. Brackbill in January of 2002 for an evaluation of atrial fibrillation and she reports that he said she had no problems and she had several visits with him and a moderate amount of testing from him.  We do not have a final written note from him at this time.  She denies any history of diabetes mellitus, thyroid disease, hiatal hernia, peptic ulcer disease, reflux or asthma or any other chronic medical condition other than noted previously.  PAST SURGICAL HISTORY: 1. Cesarean section in 1978 by Dr. Delories Heinz. 2. Left total knee arthroplasty in 1999 by Dr. Marcie Mowers. 3. Laparoscopic cholecystectomy in 1999.  SOCIAL HISTORY:  She quit smoking in 1998.  She did smoke several cigarettes a day, probably for just three or four years.  She quit drinking any alcohol in 1988.  She does not use any drugs.  She is a widow and has three grown children.  She currently lives in a two-story house with her son, daughter and four grandchildren.  Her bedroom is on the first floor.  She is unemployed at this time.  Her medical doctor is noted as Dr. Fannie Knee Drinkard and she is at [facility] and their phone number is [phone].  FAMILY HISTORY:  Her mother died at age 66 during childbirth.  Her father died at age 63 with prostate cancer.  She has one brother who passed away at age 75 and one brother alive at age 5 who has hypertension.  She also has one sister who is alive at age 41 with no multiple medical problems.  Her children are all healthy.  REVIEW OF SYSTEMS:  She does wear glasses.  She has dentures on both the upper and lower jaw lines.  She complains of occasional rhinorrhea.  She did have a history of atrial fibrillation but she reports she has been cleared from that. She did have a heart murmur as a child.   Right shoulder has recently been diagnosed with a new rotator cuff tear but she wishes to proceed with knee surgery prior to that being fixed.  All other systems are negative and noncontributory.  PHYSICAL EXAMINATION:  GENERAL:  Well-developed, well-nourished, overweight black female who walks with a significant limp.  She does walk with a cane.  Height 5 feet 7 inches. Weight 249 pounds.  VITAL SIGNS:  Temperature 98.6 degrees Fahrenheit, pulse 84, respirations 16 and BP 158/88.  HEENT:  Normocephalic, atraumatic, without frontal or maxillary sinus tenderness to palpation.  Conjunctivae pink, sclerae anicteric.  PERRLA.  EOMs intact.  No visible external ear deformities.  Hearing grossly intact. Tympanic membranes pearly gray bilaterally with good light reflex.  Nose and nasal septum midline.  Nasal mucosa pink and moist without exudates or polyps noted.  Buccal mucosa pink and moist.  Dentures in place.  Pharynx without erythema or exudates.  Tongue and uvula midline.  Tongue without fasciculations and uvula rises equally with phonation.  NECK:  No visible masses or lesions noted.  Trachea midline.  No palpable lymphadenopathy nor thyromegaly.  Carotids +2 bilaterally without bruits. Full range of motion and nontender to palpation along the cervical spine.  CARDIOVASCULAR:  She has a very irregularly irregular heart rate at this time. S1 and S2 present without rubs, clicks or murmurs noted.  RESPIRATORY:  Respirations even and unlabored.  Breath sounds clear to auscultation bilaterally without rales or wheezes noted.  ABDOMEN:  She has a large, rounded abdominal contour.  Bowel sounds present x4 quadrants.  Soft, nontender to palpation, without hepatosplenomegaly nor CVA tenderness.   Femoral pulses +2 bilaterally.  BACK:  Nontender to palpation along the entire length of the vertebral column.  BREASTS/GU/RECTAL/PELVIC:  These exams deferred at this time.  MUSCULOSKELETAL:   She has no obvious deformities of bilateral upper  extremities.  Normal muscle tone and bulk.  She has full range of motion of her elbows, wrists and fingers.  Full range of motion of her left shoulder without pain.  She does have some pain with palpation over the [facility] joint on the right and decreased abduction and forward flexion when compared to the left. Impingement testing does cause pain but she is strong.  No other deformities are noted of her upper extremities.  Radial pulses +2 bilaterally.  She has full range of motion of her hips, ankles and toes bilaterally.  DP and PT pulses are +1 bilaterally.  She does have some mild +1 to 2 edema.  Left knee has a well-healed midline surgical incision.  No erythema or ecchymosis.  No effusion at this time.  She has full extension and can flex her left knee to about 95 degrees and there is slight opening  to varus and valgus stress but this is minimal.  No pain in the calf.  No effusion.  Right knee has no obvious deformity.  No erythema or ecchymosis.  There is maybe a slight effusion of the knee.  There is a large amount of crepitus with range of motion of the right knee.  She is lacking about 10 degrees of full extension and can flex the right knee to 105 degrees.  It appears she has about a 20 degree valgus deformity of the knee.  She does have pain with palpation of both the medial and lateral joint line.  Minimal opening with varus and valgus stress.  NEUROLOGIC:  Alert and oriented x3.  Cranial nerves II-XII are grossly intact. Strength 5/5, bilateral upper and lower extremities.  Rapid alternating movements intact.  Sensation intact to light touch and deep tendon reflexes, bilateral upper and lower extremities, were +2.  RADIOLOGIC FINDINGS:  MRI taken of her right shoulder in February of 2003 showed a small full-thickness tear of distal infraspinatus and marked-to-severe thinning in the supraspinatus, possibly meaning  a full-thickness tear in that area.  She also has acromial undersurface spurring and AC joint arthritis with a lateral downsloping of the acromion.  IMPRESSION: 1. End-stage osteoarthritis, right knee, status post left knee replacement in    1999. 2. Atrial fibrillation. 3. Right rotator cuff tear. 4. History of congestive heart failure. 5. Hypertension.  PLAN:  Dr. Jenness Corner office has been contacted about the new finding of atrial fibrillation at this time.  They report she had not completed all her visits when they saw her back in January and she does need an echocardiogram and a cardiac stress test.  They will see her prior to her surgery.  If she is stable from a cardiac standpoint, we will proceed with surgery on June 22, 2001.  She will be admitted under care of Dr. Madelon Lips for a right knee replacement.  She has not donated any blood for this surgery.  We will make note of the fact of her rotator cuff tear to physical therapy postop so they can make accommodations for that with her walker. Dictated by:   Arnoldo Morale, P.A. Attending Physician:  Teena Dunk DD:  06/22/01 TD:  06/23/01 Job: [id number]

## 2010-08-22 NOTE — Discharge Summary (Signed)
[name], [name]              ACCOUNT NO.:  [id number]   MEDICAL RECORD NO.:  [id number]          PATIENT TYPE:  INP   LOCATION:  1414                         FACILITY:  [facility]   PHYSICIAN:  Lonia Blood, M.D.       DATE OF BIRTH:  [birth year]   DATE OF ADMISSION:  01/20/2008  DATE OF DISCHARGE:  01/24/2008                               DISCHARGE SUMMARY   PRIMARY CARE PHYSICIAN:  Dr. Leodis Rains with [facility]  Adult Medicine   DISCHARGE DIAGNOSES:  1. Influenza with pneumonia.  2. Chronic obstructive pulmonary disease with acute exacerbation.  3. Urinary tract infection.  4. Chronic atrial fibrillation.  5. Diabetes mellitus type 2.  6. Diastolic congestive heart failure, chronic and stable.   DISCHARGE MEDICATIONS:  1. Coumadin 5 mg daily.  2. Lisinopril 20 mg twice a day.  3. Metoprolol 100 mg twice a day.  4. Keflex 500 mg three times a day for 6 days.  5. Simvastatin 20 mg at bedtime.  6. Azithromycin 500 mg daily for 3 days.  7. Metformin 500 mg twice a day.   CONDITION ON DISCHARGE:  Robin Snow is discharged in good condition.  She is told to follow up with the primary care physician.   PROCEDURES DURING THIS ADMISSION:  No procedures done.   CONSULTATIONS THIS ADMISSION:  No consultations obtained.   HISTORY AND PHYSICAL:  Refer to dictated H and P which was done by Dr.  Flonnie Overman, January 20, 2008.   HOSPITAL COURSE:  Ms. Leng was admitted from the emergency room with  complaints of cough, shortness of breath and sick feeling.  She was  diagnosed with pneumonia as well as urinary tract infection.  It was  felt that she is suffering a bacterial pneumonia following an episode of  influenza.  Ms. Shryock was treated with Rocephin, azithromycin and also  with steroids and bronchodilators for her COPD exacerbation.  As Ms.  Hofland continued to  improve we have switched her to po antibiotics and she continued to  remain stable.  The urine culture grew  Escherichia coli which was  sensitive to cephalosporins.  As the patient was continuing to improve,  on January 24, 2008 she was discharged home with further follow-up with  her primary care physician.      Lonia Blood, M.D.  Electronically Signed     SL/MEDQ  D:  02/16/2008  T:  02/17/2008  Job:  [id number]   cc:   [facility]

## 2011-01-05 LAB — BASIC METABOLIC PANEL
BUN: 6
Chloride: 103
Creatinine, Ser: 0.84

## 2011-01-05 LAB — GLUCOSE, CAPILLARY
Glucose-Capillary: 103 — ABNORMAL HIGH
Glucose-Capillary: 105 — ABNORMAL HIGH
Glucose-Capillary: 105 — ABNORMAL HIGH
Glucose-Capillary: 111 — ABNORMAL HIGH
Glucose-Capillary: 130 — ABNORMAL HIGH
Glucose-Capillary: 91
Glucose-Capillary: 95

## 2011-01-05 LAB — CBC
MCHC: 32.9
MCHC: 33.7
MCV: 88.4
MCV: 88.5
Platelets: 138 — ABNORMAL LOW
Platelets: 93 — ABNORMAL LOW
RDW: 13.7
WBC: 3.9 — ABNORMAL LOW
WBC: 4.1

## 2011-01-05 LAB — COMPREHENSIVE METABOLIC PANEL
ALT: 22
Albumin: 3.1 — ABNORMAL LOW
Alkaline Phosphatase: 69
BUN: 5 — ABNORMAL LOW
Calcium: 8.2 — ABNORMAL LOW
Potassium: 3.8
Sodium: 135
Total Protein: 7.3

## 2011-01-05 LAB — URINALYSIS, ROUTINE W REFLEX MICROSCOPIC
Glucose, UA: NEGATIVE
Ketones, ur: NEGATIVE
Specific Gravity, Urine: 1.015
pH: 6

## 2011-01-05 LAB — URINE MICROSCOPIC-ADD ON

## 2011-01-05 LAB — PROTIME-INR
INR: 1.3
INR: 1.3
Prothrombin Time: 16.3 — ABNORMAL HIGH
Prothrombin Time: 21.3 — ABNORMAL HIGH
Prothrombin Time: 28 — ABNORMAL HIGH

## 2011-01-05 LAB — DIFFERENTIAL
Basophils Absolute: 0
Basophils Relative: 1
Eosinophils Absolute: 0
Monocytes Relative: 6
Neutrophils Relative %: 58

## 2011-01-05 LAB — CULTURE, BLOOD (ROUTINE X 2)

## 2011-01-05 LAB — B-NATRIURETIC PEPTIDE (CONVERTED LAB): Pro B Natriuretic peptide (BNP): 102 — ABNORMAL HIGH

## 2011-01-05 LAB — URINE CULTURE

## 2011-01-14 LAB — URINALYSIS, ROUTINE W REFLEX MICROSCOPIC
Glucose, UA: NEGATIVE
Nitrite: NEGATIVE
Protein, ur: 100 — AB
pH: 6

## 2011-01-14 LAB — URINE MICROSCOPIC-ADD ON

## 2011-01-14 LAB — CBC
HCT: 26.7 — ABNORMAL LOW
HCT: 28.1 — ABNORMAL LOW
HCT: 30.5 — ABNORMAL LOW
Hemoglobin: 10.4 — ABNORMAL LOW
Hemoglobin: 9 — ABNORMAL LOW
Hemoglobin: 9.5 — ABNORMAL LOW
MCHC: 33.7
MCV: 87.5
MCV: 87.8
Platelets: 114 — ABNORMAL LOW
Platelets: 124 — ABNORMAL LOW
Platelets: 132 — ABNORMAL LOW
RBC: 3.05 — ABNORMAL LOW
RDW: 14.7 — ABNORMAL HIGH
RDW: 15.1 — ABNORMAL HIGH
WBC: 10.4
WBC: 10.9 — ABNORMAL HIGH
WBC: 13.7 — ABNORMAL HIGH
WBC: 8.6

## 2011-01-14 LAB — COMPREHENSIVE METABOLIC PANEL
Albumin: 2.9 — ABNORMAL LOW
Alkaline Phosphatase: 88
BUN: 9
CO2: 30
Chloride: 99
Creatinine, Ser: 1.13
GFR calc non Af Amer: 48 — ABNORMAL LOW
Glucose, Bld: 146 — ABNORMAL HIGH
Potassium: 3.2 — ABNORMAL LOW
Total Bilirubin: 2 — ABNORMAL HIGH

## 2011-01-14 LAB — BASIC METABOLIC PANEL
BUN: 11
Calcium: 8 — ABNORMAL LOW
GFR calc non Af Amer: 51 — ABNORMAL LOW
Glucose, Bld: 101 — ABNORMAL HIGH
Potassium: 3.6
Sodium: 135

## 2011-01-14 LAB — PROTIME-INR
INR: 1.8 — ABNORMAL HIGH
INR: 1.8 — ABNORMAL HIGH
INR: 2 — ABNORMAL HIGH
INR: 2.2 — ABNORMAL HIGH
Prothrombin Time: 21.1 — ABNORMAL HIGH
Prothrombin Time: 21.3 — ABNORMAL HIGH
Prothrombin Time: 21.7 — ABNORMAL HIGH
Prothrombin Time: 23.2 — ABNORMAL HIGH
Prothrombin Time: 25 — ABNORMAL HIGH
Prothrombin Time: 28.3 — ABNORMAL HIGH

## 2011-01-14 LAB — DIFFERENTIAL
Basophils Absolute: 0
Eosinophils Absolute: 0
Lymphocytes Relative: 6 — ABNORMAL LOW
Monocytes Absolute: 0.9 — ABNORMAL HIGH
Neutrophils Relative %: 85 — ABNORMAL HIGH

## 2011-01-14 LAB — URINE CULTURE

## 2011-01-14 LAB — DIGOXIN LEVEL
Digoxin Level: 0.3 — ABNORMAL LOW
Digoxin Level: 0.5 — ABNORMAL LOW
Digoxin Level: 1.7

## 2011-01-14 LAB — CULTURE, BLOOD (ROUTINE X 2)

## 2011-01-14 LAB — HEMOGLOBIN A1C: Hgb A1c MFr Bld: 6.3 — ABNORMAL HIGH

## 2011-11-11 ENCOUNTER — Other Ambulatory Visit: Payer: Self-pay | Admitting: Internal Medicine

## 2011-11-11 DIAGNOSIS — Z1231 Encounter for screening mammogram for malignant neoplasm of breast: Secondary | ICD-10-CM

## 2011-11-24 ENCOUNTER — Ambulatory Visit
Admission: RE | Admit: 2011-11-24 | Discharge: 2011-11-24 | Disposition: A | Discharge status: Home / Self Care | Payer: Medicare Other | Source: Ambulatory Visit | Attending: Internal Medicine | Admitting: Internal Medicine

## 2011-11-24 DIAGNOSIS — Z1231 Encounter for screening mammogram for malignant neoplasm of breast: Secondary | ICD-10-CM

## 2011-12-02 ENCOUNTER — Emergency Department (HOSPITAL_COMMUNITY)
Admission: EM | Admit: 2011-12-02 | Discharge: 2011-12-03 | Disposition: A | Discharge status: Home / Self Care | Payer: Medicare Other | Attending: Emergency Medicine | Admitting: Emergency Medicine

## 2011-12-02 ENCOUNTER — Emergency Department (HOSPITAL_COMMUNITY): Payer: Medicare Other

## 2011-12-02 ENCOUNTER — Encounter (HOSPITAL_COMMUNITY): Payer: Self-pay | Admitting: Nurse Practitioner

## 2011-12-02 DIAGNOSIS — R109 Unspecified abdominal pain: Secondary | ICD-10-CM | POA: Insufficient documentation

## 2011-12-02 DIAGNOSIS — N390 Urinary tract infection, site not specified: Secondary | ICD-10-CM

## 2011-12-02 DIAGNOSIS — I10 Essential (primary) hypertension: Secondary | ICD-10-CM | POA: Insufficient documentation

## 2011-12-02 DIAGNOSIS — E119 Type 2 diabetes mellitus without complications: Secondary | ICD-10-CM | POA: Insufficient documentation

## 2011-12-02 DIAGNOSIS — M79609 Pain in unspecified limb: Secondary | ICD-10-CM | POA: Insufficient documentation

## 2011-12-02 DIAGNOSIS — D689 Coagulation defect, unspecified: Secondary | ICD-10-CM

## 2011-12-02 DIAGNOSIS — Z79899 Other long term (current) drug therapy: Secondary | ICD-10-CM | POA: Insufficient documentation

## 2011-12-02 DIAGNOSIS — I4891 Unspecified atrial fibrillation: Secondary | ICD-10-CM | POA: Insufficient documentation

## 2011-12-02 DIAGNOSIS — Z7901 Long term (current) use of anticoagulants: Secondary | ICD-10-CM | POA: Insufficient documentation

## 2011-12-02 DIAGNOSIS — R000 Tachycardia, unspecified: Secondary | ICD-10-CM | POA: Insufficient documentation

## 2011-12-02 HISTORY — DX: Essential (primary) hypertension: I10

## 2011-12-02 LAB — URINE MICROSCOPIC-ADD ON

## 2011-12-02 LAB — CBC WITH DIFFERENTIAL/PLATELET
Basophils Absolute: 0 10*3/uL (ref 0.0–0.1)
Eosinophils Absolute: 0 10*3/uL (ref 0.0–0.7)
Lymphocytes Relative: 30 % (ref 12–46)
MCH: 28.9 pg (ref 26.0–34.0)
MCHC: 33.9 g/dL (ref 30.0–36.0)
Monocytes Absolute: 1 10*3/uL (ref 0.1–1.0)
Neutrophils Relative %: 60 % (ref 43–77)
Platelets: 104 10*3/uL — ABNORMAL LOW (ref 150–400)

## 2011-12-02 LAB — BASIC METABOLIC PANEL
Calcium: 9.4 mg/dL (ref 8.4–10.5)
GFR calc Af Amer: 70 mL/min — ABNORMAL LOW (ref >90)
GFR calc non Af Amer: 61 mL/min — ABNORMAL LOW (ref >90)
Potassium: 3.9 mEq/L (ref 3.5–5.1)
Sodium: 137 mEq/L (ref 135–145)

## 2011-12-02 LAB — URINALYSIS, ROUTINE W REFLEX MICROSCOPIC
Bilirubin Urine: NEGATIVE
Ketones, ur: NEGATIVE mg/dL
Nitrite: POSITIVE — AB
Specific Gravity, Urine: 1.01 (ref 1.005–1.030)
Urobilinogen, UA: 4 mg/dL — ABNORMAL HIGH (ref 0.0–1.0)

## 2011-12-02 LAB — PROTIME-INR: Prothrombin Time: 23.9 seconds — ABNORMAL HIGH (ref 11.6–15.2)

## 2011-12-02 LAB — GLUCOSE, CAPILLARY: Glucose-Capillary: 123 mg/dL — ABNORMAL HIGH (ref 70–99)

## 2011-12-02 LAB — APTT: aPTT: 55 seconds — ABNORMAL HIGH (ref 24–37)

## 2011-12-02 MED ORDER — MORPHINE SULFATE 4 MG/ML IJ SOLN
4.0000 mg | Freq: Once | INTRAMUSCULAR | Status: AC
  Administered 2011-12-02: 4 mg via INTRAVENOUS
  Filled 2011-12-02: qty 1

## 2011-12-02 MED ORDER — DEXTROSE 5 % IV SOLN
1.0000 g | Freq: Once | INTRAVENOUS | Status: AC
  Administered 2011-12-02: 1 g via INTRAVENOUS
  Filled 2011-12-02: qty 10

## 2011-12-02 MED ORDER — SODIUM CHLORIDE 0.9 % IV SOLN
Freq: Once | INTRAVENOUS | Status: AC
  Administered 2011-12-02: 22:00:00 via INTRAVENOUS

## 2011-12-02 NOTE — ED Notes (Signed)
Patient transported to X-ray 

## 2011-12-02 NOTE — ED Notes (Signed)
Pt c/o severe pain for 2 weeks that starts in bilateral upper legs and shoots up to her lower abdomen. Pain is pounding, is aggravated by stretching and standing.   Has taken tylenol prior to ED. Pt. Is ambulatory.

## 2011-12-02 NOTE — ED Provider Notes (Signed)
History     CSN: [id number]  Arrival date & time 12/02/11  1652   First MD Initiated Contact with Robin Snow 12/02/11 2053      Chief Complaint  Robin Snow presents with  . Leg Pain    (Consider location/radiation/quality/duration/timing/severity/associated sxs/prior treatment) Robin Snow is a 71 y.o. female presenting with abdominal pain. The history is provided by the Robin Snow.  Abdominal Pain The primary symptoms of the illness include abdominal pain. The primary symptoms of the illness do not include fever, shortness of breath, nausea, vomiting, diarrhea or dysuria.  The Robin Snow has not had a change in bowel habit. Risk factors for an acute abdominal problem include being elderly and a history of abdominal surgery. Additional symptoms associated with the illness include back pain. Symptoms associated with the illness do not include constipation. Associated symptoms comments: She complains of pain in lower abdomen for one week, on and off. The pain extends into the proximal left anterior thigh. It is very definitely worse with movement and better with certain positions. No fever, dysuria. Urinating does not affect the pain. She has some back pain in low back but reports this is mild. No change in bowel movements. No change in appetite. .    Past Medical History  Diagnosis Date  . Diabetes mellitus   . Atrial fibrillation   . Hypertension     Past Surgical History  Procedure Date  . Cesarean section   . Replacement total knee bilateral     No family history on file.  History  Substance Use Topics  . Smoking status: Never Smoker   . Smokeless tobacco: Not on file  . Alcohol Use: Yes     occasional     OB History    Grav Para Term Preterm Abortions TAB SAB Ect Mult Living                  Review of Systems  Constitutional: Negative for fever and appetite change.  Respiratory: Negative for shortness of breath.   Cardiovascular: Negative for chest pain.  Gastrointestinal:  Positive for abdominal pain. Negative for nausea, vomiting, diarrhea and constipation.  Genitourinary: Negative for dysuria.  Musculoskeletal: Positive for back pain.  Neurological: Negative for weakness and numbness.    Allergies  Aspirin; Hydrocodone; and Rofecoxib  Home Medications   Current Outpatient Rx  Name Route Sig Dispense Refill  . CLONIDINE HCL 0.1 MG PO TABS Oral Take 0.1 mg by mouth daily.    [name] GLIMEPIRIDE 2 MG PO TABS Oral Take 2 mg by mouth daily before breakfast.    . HYDROCHLOROTHIAZIDE 25 MG PO TABS Oral Take 25 mg by mouth daily.    [name] LISINOPRIL 20 MG PO TABS Oral Take 20 mg by mouth daily.    [name] LOVASTATIN 20 MG PO TABS Oral Take 20 mg by mouth at bedtime.    [name] POTASSIUM CHLORIDE CRYS ER 20 MEQ PO TBCR Oral Take 20 mEq by mouth 2 (two) times daily.    . WARFARIN SODIUM 5 MG PO TABS Oral Take 5 mg by mouth daily.      BP 125/89  Pulse 117  Temp 99.5 F (37.5 C) (Oral)  Resp 25  SpO2 95%  Physical Exam  Constitutional: She is oriented to person, place, and time. She appears well-developed and well-nourished. No distress.  HENT:  Head: Normocephalic.  Neck: Normal range of motion. Neck supple.  Cardiovascular: Regular rhythm.  Tachycardia present.   No murmur heard. Pulmonary/Chest: Effort normal and breath  sounds normal.       Pulses present all extremities and equal.  Abdominal: Soft. Bowel sounds are normal. There is tenderness. There is no rebound and no guarding.       Tender midline lower abdomen predominantly, and bilateral lower quadrants to a lesser degree. Soft abdominal wall. BS + x 4. Old linear midline abdominal wall surgical scar inferior to umbilicus.  Musculoskeletal: Normal range of motion. She exhibits edema.       Edema to bilateral lower extremities most notable in ankles and feet that is non-pitting.  Neurological: She is alert and oriented to person, place, and time.  Skin: Skin is warm and dry. No rash noted.  Psychiatric: She has a  normal mood and affect.    ED Course  Procedures (including critical care time)  Labs Reviewed  GLUCOSE, CAPILLARY - Abnormal; Notable for the following:    Glucose-Capillary 123 (*)     All other components within normal limits  URINALYSIS, ROUTINE W REFLEX MICROSCOPIC  URINE CULTURE  CBC WITH DIFFERENTIAL  BASIC METABOLIC PANEL   Date: 12/02/2011  Rate: 93   Rhythm: atrial fibrillation  QRS Axis: normal  Intervals: normal  ST/T Wave abnormalities: normal  Conduction Disutrbances:none  Narrative Interpretation:   Old EKG Reviewed: unchanged Results for orders placed during the hospital encounter of 12/02/11  GLUCOSE, CAPILLARY      Component Value Range   Glucose-Capillary 123 (*) 70 - 99 mg/dL   Comment 1 Notify RN    URINALYSIS, ROUTINE W REFLEX MICROSCOPIC      Component Value Range   Color, Urine YELLOW  YELLOW   APPearance CLOUDY (*) CLEAR   Specific Gravity, Urine 1.010  1.005 - 1.030   pH 6.0  5.0 - 8.0   Glucose, UA NEGATIVE  NEGATIVE mg/dL   Hgb urine dipstick MODERATE (*) NEGATIVE   Bilirubin Urine NEGATIVE  NEGATIVE   Ketones, ur NEGATIVE  NEGATIVE mg/dL   Protein, ur NEGATIVE  NEGATIVE mg/dL   Urobilinogen, UA 4.0 (*) 0.0 - 1.0 mg/dL   Nitrite POSITIVE (*) NEGATIVE   Leukocytes, UA LARGE (*) NEGATIVE  CBC WITH DIFFERENTIAL      Component Value Range   WBC 9.5  4.0 - 10.5 K/uL   RBC 4.25  3.87 - 5.11 MIL/uL   Hemoglobin 12.3  12.0 - 15.0 g/dL   HCT 16.1  09.6 - 04.5 %   MCV 85.4  78.0 - 100.0 fL   MCH 28.9  26.0 - 34.0 pg   MCHC 33.9  30.0 - 36.0 g/dL   RDW 40.9  81.1 - 91.4 %   Platelets 104 (*) 150 - 400 K/uL   Neutrophils Relative 60  43 - 77 %   Lymphocytes Relative 30  12 - 46 %   Monocytes Relative 10  3 - 12 %   Eosinophils Relative 0  0 - 5 %   Basophils Relative 0  0 - 1 %   Neutro Abs 5.6  1.7 - 7.7 K/uL   Lymphs Abs 2.9  0.7 - 4.0 K/uL   Monocytes Absolute 1.0  0.1 - 1.0 K/uL   Eosinophils Absolute 0.0  0.0 - 0.7 K/uL   Basophils  Absolute 0.0  0.0 - 0.1 K/uL   Smear Review MORPHOLOGY UNREMARKABLE    BASIC METABOLIC PANEL      Component Value Range   Sodium 137  135 - 145 mEq/L   Potassium 3.9  3.5 - 5.1 mEq/L   Chloride  100  96 - 112 mEq/L   CO2 29  19 - 32 mEq/L   Glucose, Bld 100 (*) 70 - 99 mg/dL   BUN 10  6 - 23 mg/dL   Creatinine, Ser 1.61  0.50 - 1.10 mg/dL   Calcium 9.4  8.4 - 09.6 mg/dL   GFR calc non Af Amer 61 (*) >90 mL/min   GFR calc Af Amer 70 (*) >90 mL/min  APTT      Component Value Range   aPTT 55 (*) 24 - 37 seconds  PROTIME-INR      Component Value Range   Prothrombin Time 23.9 (*) 11.6 - 15.2 seconds   INR 2.10 (*) 0.00 - 1.49  CK      Component Value Range   Total CK 209 (*) 7 - 177 U/L  URINE MICROSCOPIC-ADD ON      Component Value Range   Squamous Epithelial / LPF RARE  RARE   WBC, UA TOO NUMEROUS TO COUNT  <3 WBC/hpf   RBC / HPF 21-50  <3 RBC/hpf   Bacteria, UA MANY (*) RARE   Urine-Other MUCOUS PRESENT     Dg Femur Left  12/02/2011  *RADIOLOGY REPORT*  Clinical Data: No known injury.  Pain.  LEFT FEMUR - 2 VIEW  Comparison: None.  Findings: No acute bony or joint abnormality is identified.  The Robin Snow is status post left knee replacement.  Acetabular protrusio is noted with thinning of the medial wall of the left acetabulum identified.  IMPRESSION:  1.  No acute finding. 2.  Acetabular protrusio. 3.  Status post left knee replacement.   Original Report Authenticated By: Bernadene Bell. Maricela Curet, M.D.    Ct Abdomen Pelvis W Contrast  12/03/2011  *RADIOLOGY REPORT*  Clinical Data: Abdominal pain  CT ABDOMEN AND PELVIS WITH CONTRAST  Technique:  Multidetector CT imaging of the abdomen and pelvis was performed following the standard protocol during bolus administration of intravenous contrast.  Contrast: OMNIPAQUE IOHEXOL 300 MG/ML  SOLN  Comparison: 05/01/2010 chest CT  Findings: Tiny nodules within the right lung base may be calcified, similar to prior chest CT.  Heart size mildly  enlarged.  No pleural or pericardial effusion.  Unremarkable liver.  Absent gallbladder.  No biliary ductal dilatation.  Unremarkable spleen, pancreas, right adrenal gland. There is a 9 mm nodule within the medial limb of the left adrenal gland, incompletely characterized.  Lobular renal contours with areas of scarring and hypoattenuation. There is urothelial hyperenhancement on the left and mild left perinephric fat stranding. Wall thickening is appreciated on the delayed phase (series 7, image 23).  Colonic diverticulosis.  No CT evidence for colitis or diverticulitis.  Normal appendix.  No bowel obstruction.  No free intraperitoneal air or fluid.  No lymphadenopathy.  There is scattered atherosclerotic calcification of the aorta and its branches.  Infrarenal ectasia.  Dilated common iliac arteries measuring up to 1.9 cm on the right and 1.7 cm on the left.  Thin-walled bladder.  Unremarkable CT appearance to the uterus and adnexa.  No deep pelvic adenopathy.  Prominent collateral vessels within the right leg are nonspecific.  Bilateral hip and SI joint DJD.  Multilevel degenerative changes of the visualized spine.  Mild leftward curvature.  IMPRESSION: Left perinephric fat stranding and urothelial hyperenhancement. This may reflect an ascending infection/pyelonephritis.  There is associated proximal ureteral wall thickening.  This may be secondary to inflammatory change however an underlying urothelial lesion is not excluded.  After appropriate therapy, recommend  a follow-up hematuria protocol CTU or MRU.  Bilateral lobular renal contours suggests sequelae of prior insult/infection.   Original Report Authenticated By: Waneta Martins, M.D.    Mm Digital Screening  11/26/2011  *RADIOLOGY REPORT*  Clinical Data: Screening.  DIGITAL BILATERAL SCREENING MAMMOGRAM WITH CAD  Comparison:  Previous exams.  Findings:  There are scattered fibroglandular densities. No suspicious masses, architectural distortion, or  calcifications are present.  Images were processed with CAD.  IMPRESSION: No mammographic evidence of malignancy.  A result letter of this screening mammogram will be mailed directly to the Robin Snow.  RECOMMENDATION: Screening mammogram in one year. (Code:SM-B-01Y)  BI-RADS CATEGORY 1:  Negative.   Original Report Authenticated By: Rosendo Gros, M.D.    No results found.   No diagnosis found.  1. Uti, early pyelonephritis  MDM  The Robin Snow's discomfort has been well controlled in ED with minimal medication. NO nausea. She denies fever, weakness. Lab studies normal with exception of coagulopathy and Robin Snow is therapeutic. She ambulates without difficulty.  Feel she can be discharged home and should follow up with primary care in 2 days for recheck.         Rodena Medin, PA-C 12/03/11 [phone]

## 2011-12-03 MED ORDER — TRAMADOL HCL 50 MG PO TABS: 50.0000 mg | ORAL_TABLET | Freq: Four times a day (QID) | ORAL | Status: AC | PRN

## 2011-12-03 MED ORDER — CEPHALEXIN 500 MG PO CAPS: 500.0000 mg | ORAL_CAPSULE | Freq: Four times a day (QID) | ORAL | Status: AC

## 2011-12-03 MED ORDER — IOHEXOL 300 MG/ML  SOLN
100.0000 mL | Freq: Once | INTRAMUSCULAR | Status: AC | PRN
  Administered 2011-12-03: 100 mL via INTRAVENOUS

## 2011-12-03 MED ORDER — HYDROCODONE-ACETAMINOPHEN 5-325 MG PO TABS: 1.0000 | ORAL_TABLET | ORAL | Status: DC | PRN

## 2011-12-03 NOTE — ED Provider Notes (Signed)
Medical screening examination/treatment/procedure(s) were conducted as a shared visit with non-physician practitioner(s) and myself.  I personally evaluated the patient during the encounter  Robin Snow is a 71 y.o. female here with L leg pain and LLQ pain. Exam showed tenderness LLQ and L thigh. Labs showed nl CBC, unremarkable BMP, INR therapeutic. CT ab/pel showed + pyelo and UA + UTI. Xray L femur showed no mass. She was diagnosed with pyelonephritis and was given ceftriaxone 1g and d/c home on kelfex and tramadol. Return precautions given by PA.    Richardean Canal, MD 12/03/11 [phone]

## 2011-12-05 LAB — URINE CULTURE

## 2011-12-06 NOTE — ED Notes (Signed)
+  Urine. Patient treated with Keflex. Sensitive to same. Per protocol MD. °

## 2012-06-16 ENCOUNTER — Encounter (HOSPITAL_COMMUNITY): Payer: Self-pay | Admitting: *Deleted

## 2012-06-16 ENCOUNTER — Emergency Department (HOSPITAL_COMMUNITY)
Admission: EM | Admit: 2012-06-16 | Discharge: 2012-06-17 | Disposition: A | Discharge status: Home / Self Care | Payer: Medicare Other | Attending: Emergency Medicine | Admitting: Emergency Medicine

## 2012-06-16 DIAGNOSIS — Y939 Activity, unspecified: Secondary | ICD-10-CM | POA: Insufficient documentation

## 2012-06-16 DIAGNOSIS — Z7901 Long term (current) use of anticoagulants: Secondary | ICD-10-CM | POA: Insufficient documentation

## 2012-06-16 DIAGNOSIS — IMO0002 Reserved for concepts with insufficient information to code with codable children: Secondary | ICD-10-CM | POA: Insufficient documentation

## 2012-06-16 DIAGNOSIS — Z8679 Personal history of other diseases of the circulatory system: Secondary | ICD-10-CM | POA: Insufficient documentation

## 2012-06-16 DIAGNOSIS — Z79899 Other long term (current) drug therapy: Secondary | ICD-10-CM | POA: Insufficient documentation

## 2012-06-16 DIAGNOSIS — I10 Essential (primary) hypertension: Secondary | ICD-10-CM | POA: Insufficient documentation

## 2012-06-16 DIAGNOSIS — X58XXXA Exposure to other specified factors, initial encounter: Secondary | ICD-10-CM | POA: Insufficient documentation

## 2012-06-16 DIAGNOSIS — Y929 Unspecified place or not applicable: Secondary | ICD-10-CM | POA: Insufficient documentation

## 2012-06-16 DIAGNOSIS — E119 Type 2 diabetes mellitus without complications: Secondary | ICD-10-CM | POA: Insufficient documentation

## 2012-06-16 LAB — POCT I-STAT TROPONIN I: Troponin i, poc: 0.01 ng/mL (ref 0.00–0.08)

## 2012-06-16 NOTE — ED Notes (Signed)
Pt c/o left arm pain/tingling x 2 wks; worse tonight; no known injury; no chest or jaw pain; no shortness of breath; pt states can't lift arm

## 2012-06-17 ENCOUNTER — Emergency Department (HOSPITAL_COMMUNITY): Payer: Medicare Other

## 2012-06-17 MED ORDER — IBUPROFEN 800 MG PO TABS
800.0000 mg | ORAL_TABLET | Freq: Once | ORAL | Status: AC
  Administered 2012-06-17: 800 mg via ORAL
  Filled 2012-06-17: qty 1

## 2012-06-17 MED ORDER — IBUPROFEN 800 MG PO TABS: 800.0000 mg | ORAL_TABLET | Freq: Three times a day (TID) | ORAL | Status: DC

## 2012-06-17 MED ORDER — METHOCARBAMOL 500 MG PO TABS: 500.0000 mg | ORAL_TABLET | Freq: Two times a day (BID) | ORAL | Status: DC

## 2012-06-17 NOTE — ED Provider Notes (Signed)
History     CSN: [id number]  Arrival date & time 06/16/12  2104   First MD Initiated Contact with Patient 06/16/12 2316      Chief Complaint  Patient presents with  . Arm Pain    (Consider location/radiation/quality/duration/timing/severity/associated sxs/prior treatment) Patient is a 72 y.o. female presenting with arm pain. The history is provided by the patient. No language interpreter was used.  Arm Pain This is a chronic problem. The current episode started more than 1 week ago. The problem occurs constantly. The problem has not changed since onset.Pertinent negatives include no chest pain, no abdominal pain, no headaches and no shortness of breath. Exacerbated by: moving or palpating the arm over the mid humerus. Nothing relieves the symptoms. She has tried nothing for the symptoms. The treatment provided no relief.  Patient has had constant arm pain for weeks.  Can't lift the arm secondary to pain  Past Medical History  Diagnosis Date  . Diabetes mellitus   . Atrial fibrillation   . Hypertension     Past Surgical History  Procedure Laterality Date  . Cesarean section    . Replacement total knee bilateral      No family history on file.  History  Substance Use Topics  . Smoking status: Never Smoker   . Smokeless tobacco: Not on file  . Alcohol Use: Yes     Comment: occasional     OB History   Grav Para Term Preterm Abortions TAB SAB Ect Mult Living                  Review of Systems  Respiratory: Negative for shortness of breath.   Cardiovascular: Negative for chest pain.  Gastrointestinal: Negative for abdominal pain.  Neurological: Negative for weakness, numbness and headaches.  All other systems reviewed and are negative.    Allergies  Aspirin; Hydrocodone; and Rofecoxib  Home Medications   Current Outpatient Rx  Name  Route  Sig  Dispense  Refill  . cloNIDine (CATAPRES) 0.1 MG tablet   Oral   Take 0.1 mg by mouth daily.         [name]  glimepiride (AMARYL) 2 MG tablet   Oral   Take 2 mg by mouth daily before breakfast.         . hydrochlorothiazide (HYDRODIURIL) 25 MG tablet   Oral   Take 25 mg by mouth daily.         [name] lisinopril (PRINIVIL,ZESTRIL) 20 MG tablet   Oral   Take 20 mg by mouth daily.         [name] lovastatin (MEVACOR) 20 MG tablet   Oral   Take 20 mg by mouth at bedtime.         . potassium chloride SA (K-DUR,KLOR-CON) 20 MEQ tablet   Oral   Take 20 mEq by mouth 2 (two) times daily.         [name] warfarin (COUMADIN) 5 MG tablet   Oral   Take 5 mg by mouth daily.           BP 160/81  Pulse 100  Temp(Src) 98.6 F (37 C)  Resp 20  SpO2 99%  Physical Exam  Constitutional: She appears well-developed and well-nourished. No distress.  HENT:  Head: Normocephalic and atraumatic.  Mouth/Throat: Oropharynx is clear and moist.  Eyes: Conjunctivae are normal. Pupils are equal, round, and reactive to light.  Neck: Normal range of motion.  Cardiovascular: Normal rate.  An irregular rhythm present.  Pulmonary/Chest: Effort normal and breath sounds normal. She has no wheezes. She has no rales.  Abdominal: Soft. Bowel sounds are normal. There is no tenderness. There is no rebound and no guarding.  Musculoskeletal: Normal range of motion. She exhibits no edema.  5/5 strength negative neers test of the left shoulder pain with palpation of the left bicep bicep tendon intact  Neurological: She has normal reflexes. She exhibits normal muscle tone. Coordination normal.  Skin: Skin is warm and dry.  Psychiatric: She has a normal mood and affect.    ED Course  Procedures (including critical care time)  Labs Reviewed  D-DIMER, QUANTITATIVE  POCT I-STAT TROPONIN I   No results found.   No diagnosis found.    MDM   Date: 06/17/2012  Rate: 89  Rhythm: atrial fibrillation  QRS Axis: normal  Intervals: normal  ST/T Wave abnormalities: normal  Conduction Disutrbances:none  Narrative  Interpretation:   Old EKG Reviewed: unchanged     MSK pain of the arm with prescribe NSAIDs. Patient is not allergic to ibuprofen.  Follow up with sports medicine.  EKG and troponin obtained in triage and unremarkable.  In the setting of 2 weeks of symptoms acs excluded.       Jasmine Awe, MD 06/17/12 [phone]

## 2012-10-24 ENCOUNTER — Emergency Department (HOSPITAL_COMMUNITY)
Admission: EM | Admit: 2012-10-24 | Discharge: 2012-10-24 | Disposition: A | Discharge status: Home / Self Care | Payer: Medicare Other | Attending: Emergency Medicine | Admitting: Emergency Medicine

## 2012-10-24 ENCOUNTER — Emergency Department (HOSPITAL_COMMUNITY): Payer: Medicare Other

## 2012-10-24 ENCOUNTER — Encounter (HOSPITAL_COMMUNITY): Payer: Self-pay | Admitting: Emergency Medicine

## 2012-10-24 DIAGNOSIS — Y929 Unspecified place or not applicable: Secondary | ICD-10-CM | POA: Insufficient documentation

## 2012-10-24 DIAGNOSIS — I4891 Unspecified atrial fibrillation: Secondary | ICD-10-CM | POA: Insufficient documentation

## 2012-10-24 DIAGNOSIS — Z7901 Long term (current) use of anticoagulants: Secondary | ICD-10-CM | POA: Insufficient documentation

## 2012-10-24 DIAGNOSIS — M129 Arthropathy, unspecified: Secondary | ICD-10-CM | POA: Insufficient documentation

## 2012-10-24 DIAGNOSIS — Y9389 Activity, other specified: Secondary | ICD-10-CM | POA: Insufficient documentation

## 2012-10-24 DIAGNOSIS — I10 Essential (primary) hypertension: Secondary | ICD-10-CM | POA: Insufficient documentation

## 2012-10-24 DIAGNOSIS — E119 Type 2 diabetes mellitus without complications: Secondary | ICD-10-CM | POA: Insufficient documentation

## 2012-10-24 DIAGNOSIS — Z96659 Presence of unspecified artificial knee joint: Secondary | ICD-10-CM | POA: Insufficient documentation

## 2012-10-24 DIAGNOSIS — M25561 Pain in right knee: Secondary | ICD-10-CM

## 2012-10-24 DIAGNOSIS — Z79899 Other long term (current) drug therapy: Secondary | ICD-10-CM | POA: Insufficient documentation

## 2012-10-24 DIAGNOSIS — S8990XA Unspecified injury of unspecified lower leg, initial encounter: Secondary | ICD-10-CM | POA: Insufficient documentation

## 2012-10-24 DIAGNOSIS — W010XXA Fall on same level from slipping, tripping and stumbling without subsequent striking against object, initial encounter: Secondary | ICD-10-CM | POA: Insufficient documentation

## 2012-10-24 DIAGNOSIS — S79919A Unspecified injury of unspecified hip, initial encounter: Secondary | ICD-10-CM | POA: Insufficient documentation

## 2012-10-24 HISTORY — DX: Unspecified osteoarthritis, unspecified site: M19.90

## 2012-10-24 LAB — PROTIME-INR: INR: 1.41 (ref 0.00–1.49)

## 2012-10-24 MED ORDER — TRAMADOL HCL 50 MG PO TABS
50.0000 mg | ORAL_TABLET | Freq: Once | ORAL | Status: AC
  Administered 2012-10-24: 50 mg via ORAL
  Filled 2012-10-24: qty 1

## 2012-10-24 MED ORDER — TRAMADOL HCL 50 MG PO TABS: 50.0000 mg | ORAL_TABLET | Freq: Four times a day (QID) | ORAL | Status: DC | PRN

## 2012-10-24 MED ORDER — TRAMADOL HCL 50 MG PO TABS: 50.0000 mg | ORAL_TABLET | Freq: Three times a day (TID) | ORAL | Status: DC | PRN

## 2012-10-24 NOTE — ED Notes (Signed)
Pt states she got up from her bed in the middle of the night and got tangled up and fell. Pt denies any LOC, or dizziness prior to fall. Pt states she hurts all over but hurts worse on rt side. Dr. Denton Lank at bedside.

## 2012-10-24 NOTE — ED Notes (Signed)
Pt sts slipped off bed last night and fell onto right side; pt denies hitting head or LOC; pt c/o right sided pain

## 2012-10-24 NOTE — ED Notes (Signed)
Discharge instructions reviewed. Pt verbalized understanding.  

## 2012-10-24 NOTE — ED Provider Notes (Addendum)
History    CSN: [id number] Arrival date & time 10/24/12  1006  First MD Initiated Contact with Patient 10/24/12 1109     Chief Complaint  Patient presents with  . Fall   (Consider location/radiation/quality/duration/timing/severity/associated sxs/prior Treatment) Patient is a 72 y.o. female presenting with fall. The history is provided by the patient.  Fall Pertinent negatives include no chest pain, no abdominal pain, no headaches and no shortness of breath.  pt c/o trip/fall last pm while getting ready for bed. Was in gown, going onto bed, states went to sit down onto bed and missed, falling onto right side. Was able to get self back up and into bed. Denies hitting head. No faintness or loc. Denies any headache since fall.  C/o pain to right side, esp right knee. Mild, constant, dull, non radiating. ?mild pain right hip. Has been able to stand/walk. No neck or back pain. No numbness/weakness. Skin intact.  Otherwise has felt normal today, no headache. Normal appetite. No nv.      Past Medical History  Diagnosis Date  . Diabetes mellitus   . Atrial fibrillation   . Hypertension   . Arthritis    Past Surgical History  Procedure Laterality Date  . Cesarean section    . Replacement total knee bilateral     History reviewed. No pertinent family history. History  Substance Use Topics  . Smoking status: Never Smoker   . Smokeless tobacco: Not on file  . Alcohol Use: Yes     Comment: occasional    OB History   Grav Para Term Preterm Abortions TAB SAB Ect Mult Living                 Review of Systems  Constitutional: Negative for fever and chills.  HENT: Negative for neck pain.   Eyes: Negative for pain.  Respiratory: Negative for shortness of breath.   Cardiovascular: Negative for chest pain.  Gastrointestinal: Negative for vomiting and abdominal pain.  Genitourinary: Negative for flank pain.  Musculoskeletal: Negative for back pain.  Skin: Negative for wound.   Neurological: Negative for weakness, numbness and headaches.  Hematological: Does not bruise/bleed easily.  Psychiatric/Behavioral: Negative for confusion.    Allergies  Aspirin; Hydrocodone; and Rofecoxib  Home Medications   Current Outpatient Rx  Name  Route  Sig  Dispense  Refill  . cloNIDine (CATAPRES) 0.1 MG tablet   Oral   Take 0.1 mg by mouth daily.         [name] glimepiride (AMARYL) 2 MG tablet   Oral   Take 2 mg by mouth daily before breakfast.         . hydrochlorothiazide (HYDRODIURIL) 25 MG tablet   Oral   Take 25 mg by mouth daily.         [name] ibuprofen (ADVIL,MOTRIN) 800 MG tablet   Oral   Take 1 tablet (800 mg total) by mouth 3 (three) times daily.   21 tablet   0   . lisinopril (PRINIVIL,ZESTRIL) 20 MG tablet   Oral   Take 20 mg by mouth daily.         [name] lovastatin (MEVACOR) 20 MG tablet   Oral   Take 20 mg by mouth at bedtime.         . methocarbamol (ROBAXIN) 500 MG tablet   Oral   Take 1 tablet (500 mg total) by mouth 2 (two) times daily.   20 tablet   0   . potassium chloride SA (  K-DUR,KLOR-CON) 20 MEQ tablet   Oral   Take 20 mEq by mouth 2 (two) times daily.         [name] warfarin (COUMADIN) 5 MG tablet   Oral   Take 5 mg by mouth daily.          BP 180/79  Pulse 84  Temp(Src) 98.4 F (36.9 C) (Oral)  Resp 18  SpO2 97% Physical Exam  Nursing note and vitals reviewed. Constitutional: She is oriented to person, place, and time. She appears well-developed and well-nourished. No distress.  HENT:  Head: Atraumatic.  Mouth/Throat: Oropharynx is clear and moist.  Eyes: Conjunctivae are normal. Pupils are equal, round, and reactive to light. No scleral icterus.  Neck: Normal range of motion. Neck supple. No tracheal deviation present.  Cardiovascular: Normal rate and normal heart sounds.   Pulmonary/Chest: Effort normal and breath sounds normal. No respiratory distress. She exhibits no tenderness.  Abdominal: Soft. Normal  appearance. She exhibits no distension. There is no tenderness.  Musculoskeletal: She exhibits no edema and no tenderness.  CTLS spine, non tender, aligned, no step off. Mild tenderness right hip and knee. Knee stable. No effusion. Distal pulses palp. Good rom bil extremities, no other focal bony tenderness or pain noted.   Neurological: She is alert and oriented to person, place, and time.  Motor intact bil. Stands, steady gait.   Skin: Skin is warm and dry. No rash noted.  Psychiatric: She has a normal mood and affect.    ED Course  Procedures (including critical care time)  Results for orders placed during the hospital encounter of 10/24/12  PROTIME-INR      Result Value Range   Prothrombin Time 16.9 (*) 11.6 - 15.2 seconds   INR 1.41  0.00 - 1.49   Dg Hip Complete Right  10/24/2012   *RADIOLOGY REPORT*  Clinical Data: Larey Seat.  RIGHT HIP - COMPLETE 2+ VIEW  Comparison: None  Findings: Bilateral hip joint degenerative changes with protrusio. No acute fracture or plain film findings for avascular necrosis. The visualized bony pelvis is intact.  The pubic symphysis and SI joints are intact.  Degenerative changes are noted.  IMPRESSION:  1.  Bilateral hip joint degenerative changes with protrusio. 2.  No acute bony findings.   Original Report Authenticated By: Rudie Meyer, M.D.   Dg Knee Complete 4 Views Right  10/24/2012   *RADIOLOGY REPORT*  Clinical Data: Right hip and knee pain.  Fell.  RIGHT KNEE - COMPLETE 4+ VIEW  Comparison: None  Findings: The long stem femoral and tibial components appear intact.  No acute fractures identified. Mild lucency along the medial tibial plateau could suggest early loosening.  Areas of heterotopic ossification are noted.  No obvious joint effusion.  IMPRESSION:  1. Intact appearing long stem tibial and femoral components.  Mild lucency along the medial tibial plateau region could suggest mild loosening. 2.  No acute bony findings or obvious joint effusion.    Original Report Authenticated By: Rudie Meyer, M.D.      MDM  Jay Schlichter.  Reviewed nursing notes and prior charts for additional history.   [place] po (pt has ride, does not have to drive).  Spine nt.   No hx head injury, head contusion, loc or headache.  Pt appears stable for d/c.     Suzi Roots, MD 10/24/12 1302  Suzi Roots, MD 10/24/12 1345

## 2012-10-24 NOTE — ED Notes (Signed)
Pt ambulated from wheelchair to bed with no assistance.

## 2013-01-15 ENCOUNTER — Other Ambulatory Visit: Payer: Self-pay | Admitting: Family Medicine

## 2013-01-16 ENCOUNTER — Other Ambulatory Visit: Payer: Self-pay

## 2013-01-16 DIAGNOSIS — Z1231 Encounter for screening mammogram for malignant neoplasm of breast: Secondary | ICD-10-CM

## 2013-01-18 ENCOUNTER — Ambulatory Visit
Admission: RE | Admit: 2013-01-18 | Discharge: 2013-01-18 | Disposition: A | Discharge status: Home / Self Care | Payer: Medicare Other | Source: Ambulatory Visit

## 2013-01-18 DIAGNOSIS — Z1231 Encounter for screening mammogram for malignant neoplasm of breast: Secondary | ICD-10-CM

## 2013-01-22 DIAGNOSIS — Z96659 Presence of unspecified artificial knee joint: Secondary | ICD-10-CM

## 2013-01-22 DIAGNOSIS — D500 Iron deficiency anemia secondary to blood loss (chronic): Secondary | ICD-10-CM | POA: Diagnosis present

## 2013-01-22 DIAGNOSIS — I10 Essential (primary) hypertension: Secondary | ICD-10-CM | POA: Diagnosis present

## 2013-01-22 DIAGNOSIS — D62 Acute posthemorrhagic anemia: Secondary | ICD-10-CM | POA: Diagnosis present

## 2013-01-22 DIAGNOSIS — K644 Residual hemorrhoidal skin tags: Secondary | ICD-10-CM | POA: Diagnosis present

## 2013-01-22 DIAGNOSIS — E785 Hyperlipidemia, unspecified: Secondary | ICD-10-CM | POA: Diagnosis present

## 2013-01-22 DIAGNOSIS — R791 Abnormal coagulation profile: Secondary | ICD-10-CM | POA: Diagnosis present

## 2013-01-22 DIAGNOSIS — K2960 Other gastritis without bleeding: Secondary | ICD-10-CM | POA: Diagnosis present

## 2013-01-22 DIAGNOSIS — M129 Arthropathy, unspecified: Secondary | ICD-10-CM | POA: Diagnosis present

## 2013-01-22 DIAGNOSIS — I4891 Unspecified atrial fibrillation: Secondary | ICD-10-CM | POA: Diagnosis present

## 2013-01-22 DIAGNOSIS — K5731 Diverticulosis of large intestine without perforation or abscess with bleeding: Principal | ICD-10-CM | POA: Diagnosis present

## 2013-01-22 DIAGNOSIS — E119 Type 2 diabetes mellitus without complications: Secondary | ICD-10-CM | POA: Diagnosis present

## 2013-01-22 DIAGNOSIS — Z7901 Long term (current) use of anticoagulants: Secondary | ICD-10-CM

## 2013-01-23 ENCOUNTER — Inpatient Hospital Stay (HOSPITAL_COMMUNITY)
Admission: EM | Admit: 2013-01-23 | Discharge: 2013-01-27 | DRG: 378 | Disposition: A | Discharge status: Home / Self Care | Payer: Medicare Other | Attending: Internal Medicine | Admitting: Internal Medicine

## 2013-01-23 ENCOUNTER — Encounter (HOSPITAL_COMMUNITY)
Admission: EM | Disposition: A | Discharge status: Home / Self Care | Payer: Self-pay | Source: Home / Self Care | Attending: Internal Medicine

## 2013-01-23 ENCOUNTER — Encounter (HOSPITAL_COMMUNITY): Payer: Self-pay | Admitting: Emergency Medicine

## 2013-01-23 DIAGNOSIS — I10 Essential (primary) hypertension: Secondary | ICD-10-CM

## 2013-01-23 DIAGNOSIS — I4891 Unspecified atrial fibrillation: Secondary | ICD-10-CM

## 2013-01-23 DIAGNOSIS — K922 Gastrointestinal hemorrhage, unspecified: Secondary | ICD-10-CM

## 2013-01-23 DIAGNOSIS — D62 Acute posthemorrhagic anemia: Secondary | ICD-10-CM

## 2013-01-23 DIAGNOSIS — T45515A Adverse effect of anticoagulants, initial encounter: Secondary | ICD-10-CM

## 2013-01-23 DIAGNOSIS — K219 Gastro-esophageal reflux disease without esophagitis: Secondary | ICD-10-CM

## 2013-01-23 HISTORY — DX: Coagulation defect, unspecified: D68.9

## 2013-01-23 HISTORY — PX: ESOPHAGOGASTRODUODENOSCOPY: SHX5428

## 2013-01-23 LAB — BASIC METABOLIC PANEL
Calcium: 8.6 mg/dL (ref 8.4–10.5)
Chloride: 106 mEq/L (ref 96–112)
Creatinine, Ser: 0.79 mg/dL (ref 0.50–1.10)
GFR calc Af Amer: >90 mL/min (ref >90)
Potassium: 3.7 mEq/L (ref 3.5–5.1)

## 2013-01-23 LAB — CBC WITH DIFFERENTIAL/PLATELET
HCT: 35.8 % — ABNORMAL LOW (ref 36.0–46.0)
Hemoglobin: 12.7 g/dL (ref 12.0–15.0)
Lymphocytes Relative: 54 % — ABNORMAL HIGH (ref 12–46)
Lymphs Abs: 3.7 10*3/uL (ref 0.7–4.0)
MCHC: 35.5 g/dL (ref 30.0–36.0)
Monocytes Absolute: 0.5 10*3/uL (ref 0.1–1.0)
Monocytes Relative: 7 % (ref 3–12)
Neutro Abs: 2.4 10*3/uL (ref 1.7–7.7)
WBC: 6.9 10*3/uL (ref 4.0–10.5)

## 2013-01-23 LAB — CBC
HCT: 34.7 % — ABNORMAL LOW (ref 36.0–46.0)
Hemoglobin: 11.7 g/dL — ABNORMAL LOW (ref 12.0–15.0)
MCH: 30.5 pg (ref 26.0–34.0)
MCHC: 33.7 g/dL (ref 30.0–36.0)
MCV: 85.9 fL (ref 78.0–100.0)
MCV: 86.9 fL (ref 78.0–100.0)
Platelets: 120 10*3/uL — ABNORMAL LOW (ref 150–400)
RDW: 14.6 % (ref 11.5–15.5)
WBC: 6.4 10*3/uL (ref 4.0–10.5)
WBC: 5.6 10*3/uL (ref 4.0–10.5)

## 2013-01-23 LAB — TROPONIN I: Troponin I: <0.3 ng/mL (ref <0.30)

## 2013-01-23 LAB — COMPREHENSIVE METABOLIC PANEL
BUN: 17 mg/dL (ref 6–23)
CO2: 24 mEq/L (ref 19–32)
Chloride: 105 mEq/L (ref 96–112)
Creatinine, Ser: 0.95 mg/dL (ref 0.50–1.10)
GFR calc non Af Amer: 58 mL/min — ABNORMAL LOW (ref >90)
Glucose, Bld: 102 mg/dL — ABNORMAL HIGH (ref 70–99)
Total Bilirubin: 0.6 mg/dL (ref 0.3–1.2)

## 2013-01-23 LAB — GLUCOSE, CAPILLARY
Glucose-Capillary: 123 mg/dL — ABNORMAL HIGH (ref 70–99)
Glucose-Capillary: 93 mg/dL (ref 70–99)

## 2013-01-23 LAB — PROTIME-INR
INR: 2.03 — ABNORMAL HIGH (ref 0.00–1.49)
Prothrombin Time: 22.3 seconds — ABNORMAL HIGH (ref 11.6–15.2)

## 2013-01-23 SURGERY — EGD (ESOPHAGOGASTRODUODENOSCOPY)
Anesthesia: Moderate Sedation | Laterality: Left

## 2013-01-23 MED ORDER — FENTANYL CITRATE 0.05 MG/ML IJ SOLN
INTRAMUSCULAR | Status: DC | PRN
  Administered 2013-01-23 (×2): 25 ug via INTRAVENOUS

## 2013-01-23 MED ORDER — MIDAZOLAM HCL 10 MG/2ML IJ SOLN
INTRAMUSCULAR | Status: DC | PRN
  Administered 2013-01-23 (×2): 2 mg via INTRAVENOUS

## 2013-01-23 MED ORDER — OXYBUTYNIN CHLORIDE ER 5 MG PO TB24
5.0000 mg | ORAL_TABLET | Freq: Every day | ORAL | Status: DC
  Administered 2013-01-23 – 2013-01-27 (×4): 5 mg via ORAL
  Filled 2013-01-23 (×5): qty 1

## 2013-01-23 MED ORDER — SIMVASTATIN 10 MG PO TABS
10.0000 mg | ORAL_TABLET | Freq: Every day | ORAL | Status: DC
  Administered 2013-01-23 – 2013-01-26 (×4): 10 mg via ORAL
  Filled 2013-01-23 (×5): qty 1

## 2013-01-23 MED ORDER — CLONIDINE HCL 0.1 MG PO TABS
0.1000 mg | ORAL_TABLET | Freq: Two times a day (BID) | ORAL | Status: DC
  Administered 2013-01-23 – 2013-01-27 (×8): 0.1 mg via ORAL
  Filled 2013-01-23 (×11): qty 1

## 2013-01-23 MED ORDER — VITAMIN K1 10 MG/ML IJ SOLN
5.0000 mg | Freq: Once | INTRAVENOUS | Status: AC
  Administered 2013-01-23: 5 mg via INTRAVENOUS
  Filled 2013-01-23: qty 0.5

## 2013-01-23 MED ORDER — SODIUM CHLORIDE 0.9 % IV BOLUS (SEPSIS)
1000.0000 mL | Freq: Once | INTRAVENOUS | Status: AC
  Administered 2013-01-23: 1000 mL via INTRAVENOUS

## 2013-01-23 MED ORDER — MIDAZOLAM HCL 5 MG/ML IJ SOLN
INTRAMUSCULAR | Status: AC
  Filled 2013-01-23: qty 2

## 2013-01-23 MED ORDER — SODIUM CHLORIDE 0.9 % IJ SOLN
3.0000 mL | Freq: Two times a day (BID) | INTRAMUSCULAR | Status: DC
  Administered 2013-01-23 – 2013-01-27 (×8): 3 mL via INTRAVENOUS

## 2013-01-23 MED ORDER — LISINOPRIL 20 MG PO TABS
20.0000 mg | ORAL_TABLET | Freq: Every day | ORAL | Status: DC
  Administered 2013-01-23 – 2013-01-27 (×4): 20 mg via ORAL
  Filled 2013-01-23 (×5): qty 1

## 2013-01-23 MED ORDER — SODIUM CHLORIDE 0.9 % IV SOLN: INTRAVENOUS | Status: DC

## 2013-01-23 MED ORDER — DIPHENHYDRAMINE HCL 50 MG/ML IJ SOLN
INTRAMUSCULAR | Status: AC
  Filled 2013-01-23: qty 1

## 2013-01-23 MED ORDER — HYDROCODONE-ACETAMINOPHEN 5-325 MG PO TABS: 1.0000 | ORAL_TABLET | Freq: Two times a day (BID) | ORAL | Status: DC | PRN

## 2013-01-23 MED ORDER — BISACODYL 5 MG PO TBEC
10.0000 mg | DELAYED_RELEASE_TABLET | Freq: Once | ORAL | Status: AC
  Administered 2013-01-23: 23:00:00 10 mg via ORAL
  Filled 2013-01-23: qty 2

## 2013-01-23 MED ORDER — PEG 3350-KCL-NA BICARB-NACL 420 G PO SOLR
4000.0000 mL | Freq: Once | ORAL | Status: AC
  Administered 2013-01-23: 4000 mL via ORAL
  Filled 2013-01-23: qty 4000

## 2013-01-23 MED ORDER — SODIUM CHLORIDE 0.9 % IV SOLN
INTRAVENOUS | Status: DC
  Administered 2013-01-23: 20 mL/h via INTRAVENOUS

## 2013-01-23 MED ORDER — INSULIN ASPART 100 UNIT/ML ~~LOC~~ SOLN
0.0000 [IU] | SUBCUTANEOUS | Status: DC
  Administered 2013-01-23 – 2013-01-26 (×4): 1 [IU] via SUBCUTANEOUS

## 2013-01-23 MED ORDER — BUTAMBEN-TETRACAINE-BENZOCAINE 2-2-14 % EX AERO
INHALATION_SPRAY | CUTANEOUS | Status: DC | PRN
  Administered 2013-01-23: 2 via TOPICAL

## 2013-01-23 MED ORDER — FENTANYL CITRATE 0.05 MG/ML IJ SOLN
INTRAMUSCULAR | Status: AC
  Filled 2013-01-23: qty 4

## 2013-01-23 MED ORDER — LABETALOL HCL 5 MG/ML IV SOLN
10.0000 mg | INTRAVENOUS | Status: DC | PRN
  Filled 2013-01-23: qty 4

## 2013-01-23 NOTE — Op Note (Signed)
[facility] [address] [place], [address]   ENDOSCOPY PROCEDURE REPORT  PATIENT: Robin Snow, Robin Snow  MR#: [id number] BIRTHDATE: [birth year] , 72  yrs. old GENDER: Female ENDOSCOPIST: Willis Modena, MD REFERRED BY:  [facility] PROCEDURE DATE:  01/23/2013 PROCEDURE:  EGD, diagnostic ASA CLASS:     Class III INDICATIONS:  blood in stool (melena), anemia. MEDICATIONS: Fentanyl 50 mcg IV and Versed 4 mg IV TOPICAL ANESTHETIC: Cetacaine Spray  DESCRIPTION OF PROCEDURE: After the risks benefits and alternatives of the procedure were thoroughly explained, informed consent was obtained.  The Pentax Gastroscope [id number] endoscope was introduced through the mouth and advanced to the second portion of the duodenum. Without limitations.  The instrument was slowly withdrawn as the mucosa was fully examined.     Findings:  Normal esophagus.  Pre-pyloric erosions and ulcerations. Small antral diverticulum.  Otherwise normal stomach, pylorus, and duodenum to the second portion.  No old or fresh blood seen to the extent of our examination.            The scope was then withdrawn from the patient and the procedure completed.  ENDOSCOPIC IMPRESSION:      As above.  Gastric erosions could potentially lead to patient's melena and anemia, but these findings are not compelling enough to exclude colonic source of her GI bleeding.  RECOMMENDATIONS:     1.  Watch for potential complications of procedure. 2.  PPI therapy. 3.  Proceed with colonoscopy tomorrow. 4.  [facility] inpatient team to follow.  eSigned:  Willis Modena, MD 01/23/2013 2:53 PM   CC:

## 2013-01-23 NOTE — H&P (Signed)
[facility] History and Physical  Robin Snow XBJ:[id number] DOB: [birth year] DOA: 01/23/2013  Referring physician: ED PCP: Burtis Junes, MD   Chief Complaint: LGIB  HPI: Robin Snow is a 72 y.o. female who presents to the ED with 2 episodes at home of hematochezia (would have 1 more episode here while in the ED as well).  Patient had a BM at 2300, was about to flush but noticed that commode was full of dark red blood.  She did not appreciate any clots.  The patient is on chronic coumadin for A.Fib with an INR in the ED of 2.03.  She has been given Vitamin K and FFP has been ordered.  Her pulse is 90 and she is actually hypertensive with SBPs between the 170s to 190s.  Hospitalist has been asked to admit.  Review of Systems: 12 systems reviewed and otherwise negative.  Past Medical History  Diagnosis Date  . Diabetes mellitus   . Atrial fibrillation   . Hypertension   . Arthritis   . Coagulopathy    Past Surgical History  Procedure Laterality Date  . Cesarean section    . Replacement total knee bilateral     Social History:  reports that she has never smoked. She does not have any smokeless tobacco history on file. She reports that she drinks alcohol. Her drug history is not on file.   Allergies  Allergen Reactions  . Aspirin Hives  . Hydrocodone Hives  . Rofecoxib Hives    No family history on file.  Prior to Admission medications   Medication Sig Start Date End Date Taking? Authorizing Provider  cloNIDine (CATAPRES) 0.1 MG tablet Take 0.1 mg by mouth 2 (two) times daily.    Yes Historical Provider, MD  furosemide (LASIX) 40 MG tablet Take 40 mg by mouth daily as needed (swelling).   Yes Historical Provider, MD  glimepiride (AMARYL) 2 MG tablet Take 2 mg by mouth daily before breakfast.   Yes Historical Provider, MD  hydrochlorothiazide (MICROZIDE) 12.5 MG capsule Take 12.5 mg by mouth daily.   Yes Historical Provider, MD  HYDROcodone-acetaminophen  (NORCO/VICODIN) 5-325 MG per tablet Take 1 tablet by mouth 2 (two) times daily as needed for pain.   Yes Historical Provider, MD  lisinopril (PRINIVIL,ZESTRIL) 20 MG tablet Take 20 mg by mouth daily.   Yes Historical Provider, MD  oxybutynin (DITROPAN-XL) 5 MG 24 hr tablet Take 5 mg by mouth daily.   Yes Historical Provider, MD  potassium chloride SA (K-DUR,KLOR-CON) 20 MEQ tablet Take 20 mEq by mouth daily.    Yes Historical Provider, MD  warfarin (COUMADIN) 5 MG tablet Take 5 mg by mouth daily.   Yes Historical Provider, MD  lovastatin (MEVACOR) 20 MG tablet Take 20 mg by mouth at bedtime.    Historical Provider, MD   Physical Exam: Filed Vitals:   01/23/13 0114  BP: 197/120  Pulse: 131  Temp:   Resp:     General:  NAD, resting comfortably in bed Eyes: PEERLA EOMI ENT: mucous membranes moist Neck: supple w/o JVD Cardiovascular: RRR w/o MRG Respiratory: CTA B Abdomen: soft, nt, nd, bs+ Skin: no rash nor lesion Musculoskeletal: MAE, full ROM all 4 extremities Psychiatric: normal tone and affect Neurologic: AAOx3, grossly non-focal  Labs on Admission:  Basic Metabolic Panel:  Recent Labs Lab 01/23/13 0017  NA 141  K 3.8  CL 105  CO2 24  GLUCOSE 102*  BUN 17  CREATININE 0.95  CALCIUM 9.4  Liver Function Tests:  Recent Labs Lab 01/23/13 0017  AST 18  ALT 11  ALKPHOS 86  BILITOT 0.6  PROT 8.2  ALBUMIN 3.9   No results found for this basename: LIPASE, AMYLASE,  in the last 168 hours No results found for this basename: AMMONIA,  in the last 168 hours CBC:  Recent Labs Lab 01/23/13 0017 01/23/13 0243  WBC 6.9 6.4  NEUTROABS 2.4  --   HGB 12.7 11.7*  HCT 35.8* 34.7*  MCV 85.4 85.9  PLT 142* 123*   Cardiac Enzymes: No results found for this basename: CKTOTAL, CKMB, CKMBINDEX, TROPONINI,  in the last 168 hours  BNP (last 3 results) No results found for this basename: PROBNP,  in the last 8760 hours CBG: No results found for this basename: GLUCAP,  in  the last 168 hours  Radiological Exams on Admission: No results found.  EKG: Independently reviewed.  Assessment/Plan Principal Problem:   Lower GI bleed Active Problems:   HYPERTENSION, BENIGN   1. LGIB - repeat CBC in AM (2 hours from now), vit k and FFP ordered, tele monitor, IVF and PRBC as indicated.  Needs GI consult in AM and likely colonoscopy tomorrow after a bowel prep. 2. HTN - PRN labetalol ordered, holding diuretics since patient is actively loosing blood 3. A.Fib - continue rate control with beta blocker, tele monitor    Code Status: Full Code (must indicate code status--if unknown or must be presumed, indicate so) Family Communication: No family in room (indicate person spoken with, if applicable, with phone number if by telephone) Disposition Plan: Admit to inpatient (indicate anticipated LOS)  Time spent: 70 min  GARDNER, JARED M. [facility] Pager [phone]  If 7PM-7AM, please contact night-coverage [email or web address] Password [email or web address] 01/23/2013, 3:31 AM

## 2013-01-23 NOTE — ED Notes (Signed)
MD asked that a bedside commode be given to patient for convenience. Task completed.

## 2013-01-23 NOTE — Progress Notes (Signed)
[facility] PROGRESS NOTE  Assessment/Plan: Lower GI bleed: - Consulted GI pt was on warfarin. Reversed with FFP and Vit K. - Hemodynamically stable, drop in hbg from 12-> 10.5. - cont to monitor CBC.  HYPERTENSION, BENIGN: - stable, cont home meds.  A.fib: - rate control, was given FFP and vit K to reverse coagulopathy    Code Status: Full Code Family Communication: No family in room  Disposition Plan: Admit to inpatient   Consultants:  Enid Baas  Procedures:  Colonoscopy  Antibiotics:  None  HPI/Subjective: No complains, miminal blood with last BM.  Objective: Filed Vitals:   01/23/13 0435 01/23/13 0438 01/23/13 0619 01/23/13 0650  BP: 179/105 166/110 129/87 120/74  Pulse: 85 100 85 80  Temp:   97.7 F (36.5 C) 97.7 F (36.5 C)  TempSrc:   Oral Oral  Resp:   18 18  SpO2:   98% 100%    Intake/Output Summary (Last 24 hours) at 01/23/13 0809 Last data filed at 01/23/13 7564  Gross per 24 hour  Intake   15.5 ml  Output      0 ml  Net   15.5 ml   There were no vitals filed for this visit.  Exam:  General: Alert, awake, oriented x3, in no acute distress.  HEENT: No bruits, no goiter.  Heart: Regular rate and rhythm, without murmurs, rubs, gallops.  Lungs: Good air movement, clear to auscultation.  Abdomen: Soft, nontender, nondistended, positive bowel sounds.  Neuro: Grossly intact, nonfocal.   Data Reviewed: Basic Metabolic Panel:  Recent Labs Lab 01/23/13 0017 01/23/13 0547  NA 141 140  K 3.8 3.7  CL 105 106  CO2 24 23  GLUCOSE 102* 109*  BUN 17 15  CREATININE 0.95 0.79  CALCIUM 9.4 8.6   Liver Function Tests:  Recent Labs Lab 01/23/13 0017  AST 18  ALT 11  ALKPHOS 86  BILITOT 0.6  PROT 8.2  ALBUMIN 3.9   No results found for this basename: LIPASE, AMYLASE,  in the last 168 hours No results found for this basename: AMMONIA,  in the last 168 hours CBC:  Recent Labs Lab 01/23/13 0017 01/23/13 0243 01/23/13 0547   WBC 6.9 6.4 5.6  NEUTROABS 2.4  --   --   HGB 12.7 11.7* 10.5*  HCT 35.8* 34.7* 29.9*  MCV 85.4 85.9 86.9  PLT 142* 123* 120*   Cardiac Enzymes:  Recent Labs Lab 01/23/13 0243  TROPONINI <0.30   BNP (last 3 results) No results found for this basename: PROBNP,  in the last 8760 hours CBG:  Recent Labs Lab 01/23/13 0453  GLUCAP 123*    No results found for this or any previous visit (from the past 240 hour(s)).   Studies: No results found.  Scheduled Meds: . cloNIDine  0.1 mg Oral BID  . insulin aspart  0-9 Units Subcutaneous Q4H  . lisinopril  20 mg Oral Daily  . oxybutynin  5 mg Oral Daily  . simvastatin  10 mg Oral q1800  . sodium chloride  3 mL Intravenous Q12H   Continuous Infusions:    Marinda Elk  [facility] Pager [phone]. If 8PM-8AM, please contact night-coverage at [email or web address], password [facility] 01/23/2013, 8:09 AM  LOS: 0 days

## 2013-01-23 NOTE — Interval H&P Note (Signed)
History and Physical Interval Note:  01/23/2013 2:38 PM  Robin Snow  has presented today for surgery, with the diagnosis of Melena, anemia  The various methods of treatment have been discussed with the patient and family. After consideration of risks, benefits and other options for treatment, the patient has consented to  Procedure(s): ESOPHAGOGASTRODUODENOSCOPY (EGD) (Left) as a surgical intervention .  The patient's history has been reviewed, patient examined, no change in status, stable for surgery.  I have reviewed the patient's chart and labs.  Questions were answered to the patient's satisfaction.     Robin Snow M  Assessment:  1.  Blood in stool (melena). 2.  Anemia.  Plan:  1.  Endoscopy. 2.  Risks (bleeding, infection, bowel perforation that could require surgery, sedation-related changes in cardiopulmonary systems), benefits (identification and possible treatment of source of symptoms, exclusion of certain causes of symptoms), and alternatives (watchful waiting, radiographic imaging studies, empiric medical treatment) of upper endoscopy (EGD) were explained to patient  in detail and patient wishes to proceed.

## 2013-01-23 NOTE — ED Notes (Addendum)
Pt. reports rectal bleeding onset this evening , denies injury , pt. stated she is taking Coumadin for atrial fibrillation . No pain / respirations unlabored .

## 2013-01-23 NOTE — Progress Notes (Signed)
Utilization Review Completed.Robin Snow T10/20/2014  

## 2013-01-23 NOTE — H&P (View-Only) (Signed)
[facility] Consultation Note  Referring Provider: Dr. Abraham Ortiz ([facility]) Primary Care Physician:  [name],[name], MD  Reason for Consultation:  Blood in stool  HPI: Robin Snow is a 72 y.o. female admitted for blood in stool.  Last night, she had acute onset of bowel urgency, followed by black stool.  No red blood in stool.  No abdominal pain, hematemesis.  No prior GI bleeding.  Is on warfarin, with INR 2.0.  Had colonoscopy about 4 years ago, here locally (? With whom), states she had a couple polyps removed.  Denies NSAIDs.  Denies prior endoscopy.   Past Medical History  Diagnosis Date  . Diabetes mellitus   . Atrial fibrillation   . Hypertension   . Arthritis   . Coagulopathy     Past Surgical History  Procedure Laterality Date  . Cesarean section    . Replacement total knee bilateral    . Tubal ligation      Prior to Admission medications   Medication Sig Start Date End Date Taking? Authorizing Provider  cloNIDine (CATAPRES) 0.1 MG tablet Take 0.1 mg by mouth 2 (two) times daily.    Yes Historical Provider, MD  furosemide (LASIX) 40 MG tablet Take 40 mg by mouth daily as needed (swelling).   Yes Historical Provider, MD  glimepiride (AMARYL) 2 MG tablet Take 2 mg by mouth daily before breakfast.   Yes Historical Provider, MD  hydrochlorothiazide (MICROZIDE) 12.5 MG capsule Take 12.5 mg by mouth daily.   Yes Historical Provider, MD  HYDROcodone-acetaminophen (NORCO/VICODIN) 5-325 MG per tablet Take 1 tablet by mouth 2 (two) times daily as needed for pain.   Yes Historical Provider, MD  lisinopril (PRINIVIL,ZESTRIL) 20 MG tablet Take 20 mg by mouth daily.   Yes Historical Provider, MD  oxybutynin (DITROPAN-XL) 5 MG 24 hr tablet Take 5 mg by mouth daily.   Yes Historical Provider, MD  potassium chloride SA (K-DUR,KLOR-CON) 20 MEQ tablet Take 20 mEq by mouth daily.    Yes Historical Provider, MD  warfarin (COUMADIN) 5 MG tablet Take 5 mg by mouth daily.    Yes Historical Provider, MD  lovastatin (MEVACOR) 20 MG tablet Take 20 mg by mouth at bedtime.    Historical Provider, MD    Current Facility-Administered Medications  Medication Dose Route Frequency Provider Last Rate Last Dose  . cloNIDine (CATAPRES) tablet 0.1 mg  0.1 mg Oral BID Jared M. Gardner, DO   0.1 mg at 01/23/13 0346  . HYDROcodone-acetaminophen (NORCO/VICODIN) 5-325 MG per tablet 1 tablet  1 tablet Oral BID PRN Jared M. Gardner, DO      . insulin aspart (novoLOG) injection 0-9 Units  0-9 Units Subcutaneous Q4H Jared M. Gardner, DO   1 Units at 01/23/13 0614  . labetalol (NORMODYNE,TRANDATE) injection 10 mg  10 mg Intravenous Q2H PRN Jared M. Gardner, DO      . lisinopril (PRINIVIL,ZESTRIL) tablet 20 mg  20 mg Oral Daily Jared M. Gardner, DO      . oxybutynin (DITROPAN-XL) 24 hr tablet 5 mg  5 mg Oral Daily Jared M. Gardner, DO      . simvastatin (ZOCOR) tablet 10 mg  10 mg Oral q1800 Jared M. Gardner, DO      . sodium chloride 0.9 % injection 3 mL  3 mL Intravenous Q12H Jared M. Gardner, DO   3 mL at 01/23/13 0333    Allergies as of 01/22/2013 - Review Complete 10/24/2012  Allergen Reaction Noted  . Aspirin Hives   05/22/2010  . Hydrocodone Hives 05/22/2010  . Rofecoxib Hives 05/22/2010    History reviewed. No pertinent family history.  History   Social History  . Marital Status: Widowed    Spouse Name: [name]    Number of Children: [name]  . Years of Education: [name]   Occupational History  . Not on file.   Social History Main Topics  . Smoking status: Never Smoker   . Smokeless tobacco: Never Used  . Alcohol Use: Yes     Comment: occasional   . Drug Use: No  . Sexual Activity: Not on file   Other Topics Concern  . Not on file   Social History Narrative  . No narrative on file    Review of Systems: As per HPI, all others negative  Physical Exam: Vital signs in last 24 hours: Temp:  [97.7 F (36.5 C)-98.4 F (36.9 C)] 97.9 F (36.6 C) (10/20 0830) Pulse  Rate:  [80-131] 93 (10/20 0830) Resp:  [18-20] 20 (10/20 0830) BP: (120-197)/(66-120) 121/66 mmHg (10/20 0830) SpO2:  [98 %-100 %] 100 % (10/20 0830)   General:   Alert,  Well-developed, well-nourished, pleasant and cooperative in NAD Head:  Normocephalic and atraumatic. Eyes:  Sclera clear, no icterus.   Conjunctiva pink. Ears:  Normal auditory acuity. Nose:  No deformity, discharge,  or lesions. Mouth:  No deformity or lesions.  Oropharynx somewhat dry. Neck:  Supple; no masses or thyromegaly. Lungs:  Clear throughout to auscultation.   No wheezes, crackles, or rhonchi. No acute distress. Heart:  Regular rate and rhythm; no murmurs, clicks, rubs,  or gallops. Abdomen:  Soft, nontender, mild distended. No masses, hepatosplenomegaly or hernias noted. Normal bowel sounds, without guarding, and without rebound.     Msk:  Symmetrical without gross deformities. Normal posture. Pulses:  Normal pulses noted. Extremities:  Without clubbing or edema. Neurologic:  Alert and  oriented x4;  Diffusely weak, otherwise grossly normal neurologically. Skin:  Intact without significant lesions or rashes. Cervical Nodes:  No significant cervical adenopathy. Psych:  Alert and cooperative. Normal mood and affect.   Lab Results:  Recent Labs  01/23/13 0017 01/23/13 0243 01/23/13 0547  WBC 6.9 6.4 5.6  HGB 12.7 11.7* 10.5*  HCT 35.8* 34.7* 29.9*  PLT 142* 123* 120*   BMET  Recent Labs  01/23/13 0017 01/23/13 0547  NA 141 140  K 3.8 3.7  CL 105 106  CO2 24 23  GLUCOSE 102* 109*  BUN 17 15  CREATININE 0.95 0.79  CALCIUM 9.4 8.6   LFT  Recent Labs  01/23/13 0017  PROT 8.2  ALBUMIN 3.9  AST 18  ALT 11  ALKPHOS 86  BILITOT 0.6   PT/INR  Recent Labs  01/23/13 0017  LABPROT 22.3*  INR 2.03*    Studies/Results: No results found.  Impression:  1.  GIB in setting of warfarin.  Melena more suggestive of upper GI tract source, though BUN is normal. 2.  Acute blood loss  anemia.  Plan:  1.  PPI. 2.  NPO. 3.  Endoscopy today, after she has finished her second unit of FFP; if endoscopy is unrevealing, will do colonoscopy tomorrow.   LOS: 0 days   Shameria Trimarco M  01/23/2013, 9:28 AM    

## 2013-01-23 NOTE — ED Notes (Signed)
Pt states she had a BM tonight and when she whipped noticed dark red blood when whipping, but states she did not notice any changes to her stool in the toilet, just when she wiped.

## 2013-01-23 NOTE — ED Provider Notes (Signed)
CSN: [id number]     Arrival date & time 01/22/13  2355 History   First MD Initiated Contact with Patient 01/23/13 0054     Chief Complaint  Patient presents with  . Rectal Bleeding   (Consider location/radiation/quality/duration/timing/severity/associated sxs/prior Treatment) HPI Robin Snow is a very pleasant 72 yo woman who is anticoagulated with Coumadin for a history of AF. She presents after two episodes of hematochezia. Patient had a BM around 2300 and noticed, as she was about to flush the toilet, that the commode was full of dark red blood. She did not appreciate any clots. She did not appreciated any stool mixed with blood. She estimates that she passed approximately 1 cup of blood with each of two episodes. She had a third episode of hematochezia here in the ED which was smaller but, notable for some clotted blood.   The patient denies any history of GI bleeding. She has had screening colonoscopies and has never been alerted of any abnormal findings. Can't recall date of last. She denies experiencing any abdominal pain, lightheadedness, near syncope, SOB or CP. Dose of Coumadin has been stable and the patient's last INR check was over 3 weeks ago, she says.    Past Medical History  Diagnosis Date  . Diabetes mellitus   . Atrial fibrillation   . Hypertension   . Arthritis   . Coagulopathy    Past Surgical History  Procedure Laterality Date  . Cesarean section    . Replacement total knee bilateral     No family history on file. History  Substance Use Topics  . Smoking status: Never Smoker   . Smokeless tobacco: Not on file  . Alcohol Use: Yes     Comment: occasional    OB History   Grav Para Term Preterm Abortions TAB SAB Ect Mult Living                 Review of Systems 10 point ROS performed and is negative with the exception of sx noted above.   Allergies  Aspirin; Hydrocodone; and Rofecoxib  Home Medications   Current Outpatient Rx  Name  Route  Sig   Dispense  Refill  . cloNIDine (CATAPRES) 0.1 MG tablet   Oral   Take 0.1 mg by mouth 2 (two) times daily.          . furosemide (LASIX) 40 MG tablet   Oral   Take 40 mg by mouth daily as needed (swelling).         [name] glimepiride (AMARYL) 2 MG tablet   Oral   Take 2 mg by mouth daily before breakfast.         . hydrochlorothiazide (MICROZIDE) 12.5 MG capsule   Oral   Take 12.5 mg by mouth daily.         [name] HYDROcodone-acetaminophen (NORCO/VICODIN) 5-325 MG per tablet   Oral   Take 1 tablet by mouth 2 (two) times daily as needed for pain.         [name] lisinopril (PRINIVIL,ZESTRIL) 20 MG tablet   Oral   Take 20 mg by mouth daily.         [name] oxybutynin (DITROPAN-XL) 5 MG 24 hr tablet   Oral   Take 5 mg by mouth daily.         . potassium chloride SA (K-DUR,KLOR-CON) 20 MEQ tablet   Oral   Take 20 mEq by mouth daily.          [name] warfarin (COUMADIN)  5 MG tablet   Oral   Take 5 mg by mouth daily.         [name] lovastatin (MEVACOR) 20 MG tablet   Oral   Take 20 mg by mouth at bedtime.          BP 197/120  Pulse 131  Temp(Src) 98.4 F (36.9 C) (Oral)  Resp 18  SpO2 100% Physical Exam Gen: well developed and well nourished appearing Head: NCAT Eyes: PERL, EOMI Nose: no epistaixis or rhinorrhea Mouth/throat: mucosa is moist and pink Neck: supple, no stridor Lungs: CTA B, no wheezing, rhonchi or rales CV: irreg irreg, no murmur, extremities appears well perfused.  Abd: soft, notender, nondistended DRE: no active rectal bleeding, stool is notable for marroon stools, guaiac positive Back: no ttp, no cva ttp Skin: no rashese, wnl Neuro: CN ii-xii grossly intact, no focal deficits Psyche; normal affect,  calm and cooperative.   ED Course  Procedures (including critical care time) Labs Review Results for orders placed during the hospital encounter of 01/23/13 (from the past 24 hour(s))  CBC WITH DIFFERENTIAL     Status: Abnormal   Collection Time     01/23/13 12:17 AM      Result Value Range   WBC 6.9  4.0 - 10.5 K/uL   RBC 4.19  3.87 - 5.11 MIL/uL   Hemoglobin 12.7  12.0 - 15.0 g/dL   HCT 81.1 (*) 91.4 - 78.2 %   MCV 85.4  78.0 - 100.0 fL   MCH 30.3  26.0 - 34.0 pg   MCHC 35.5  30.0 - 36.0 g/dL   RDW 95.6  21.3 - 08.6 %   Platelets 142 (*) 150 - 400 K/uL   Neutrophils Relative % 36 (*) 43 - 77 %   Neutro Abs 2.4  1.7 - 7.7 K/uL   Lymphocytes Relative 54 (*) 12 - 46 %   Lymphs Abs 3.7  0.7 - 4.0 K/uL   Monocytes Relative 7  3 - 12 %   Monocytes Absolute 0.5  0.1 - 1.0 K/uL   Eosinophils Relative 3  0 - 5 %   Eosinophils Absolute 0.2  0.0 - 0.7 K/uL   Basophils Relative 0  0 - 1 %   Basophils Absolute 0.0  0.0 - 0.1 K/uL  COMPREHENSIVE METABOLIC PANEL     Status: Abnormal   Collection Time    01/23/13 12:17 AM      Result Value Range   Sodium 141  135 - 145 mEq/L   Potassium 3.8  3.5 - 5.1 mEq/L   Chloride 105  96 - 112 mEq/L   CO2 24  19 - 32 mEq/L   Glucose, Bld 102 (*) 70 - 99 mg/dL   BUN 17  6 - 23 mg/dL   Creatinine, Ser 5.78  0.50 - 1.10 mg/dL   Calcium 9.4  8.4 - 46.9 mg/dL   Total Protein 8.2  6.0 - 8.3 g/dL   Albumin 3.9  3.5 - 5.2 g/dL   AST 18  0 - 37 U/L   ALT 11  0 - 35 U/L   Alkaline Phosphatase 86  39 - 117 U/L   Total Bilirubin 0.6  0.3 - 1.2 mg/dL   GFR calc non Af Amer 58 (*) >90 mL/min   GFR calc Af Amer 67 (*) >90 mL/min  PROTIME-INR     Status: Abnormal   Collection Time    01/23/13 12:17 AM      Result Value Range  Prothrombin Time 22.3 (*) 11.6 - 15.2 seconds   INR 2.03 (*) 0.00 - 1.49    Imaging Review No results found.  EKG: AF, normal rate, normal axis, no acute ischemic changes, normal ST T segments.   MDM  Patient anticoagulated with Coumadin - INR 2.0 - with new onset of lower GI bleeding. Asymptomatic at this time. Pulse fluctuating between 90 & 100. Patient noted to be hypertensive in the 170-190 systolic range. We will check orthostatic VS. We will place two large bore IVs and  initiate volume resuscitation, type and screen and reverse anticoagulation. We will also recheck H/H. Plan to admit.     Brandt Loosen, MD 01/25/13 [phone]

## 2013-01-23 NOTE — Consult Note (Signed)
[facility] Gastroenterology Consultation Note  Referring Provider: Dr. Rosine Beat [facility]) Primary Care Physician:  Burtis Junes, MD  Reason for Consultation:  Blood in stool  HPI: Robin Snow is a 72 y.o. female admitted for blood in stool.  Last night, she had acute onset of bowel urgency, followed by black stool.  No red blood in stool.  No abdominal pain, hematemesis.  No prior GI bleeding.  Is on warfarin, with INR 2.0.  Had colonoscopy about 4 years ago, here locally (? With whom), states she had a couple polyps removed.  Denies NSAIDs.  Denies prior endoscopy.   Past Medical History  Diagnosis Date  . Diabetes mellitus   . Atrial fibrillation   . Hypertension   . Arthritis   . Coagulopathy     Past Surgical History  Procedure Laterality Date  . Cesarean section    . Replacement total knee bilateral    . Tubal ligation      Prior to Admission medications   Medication Sig Start Date End Date Taking? Authorizing Provider  cloNIDine (CATAPRES) 0.1 MG tablet Take 0.1 mg by mouth 2 (two) times daily.    Yes Historical Provider, MD  furosemide (LASIX) 40 MG tablet Take 40 mg by mouth daily as needed (swelling).   Yes Historical Provider, MD  glimepiride (AMARYL) 2 MG tablet Take 2 mg by mouth daily before breakfast.   Yes Historical Provider, MD  hydrochlorothiazide (MICROZIDE) 12.5 MG capsule Take 12.5 mg by mouth daily.   Yes Historical Provider, MD  HYDROcodone-acetaminophen (NORCO/VICODIN) 5-325 MG per tablet Take 1 tablet by mouth 2 (two) times daily as needed for pain.   Yes Historical Provider, MD  lisinopril (PRINIVIL,ZESTRIL) 20 MG tablet Take 20 mg by mouth daily.   Yes Historical Provider, MD  oxybutynin (DITROPAN-XL) 5 MG 24 hr tablet Take 5 mg by mouth daily.   Yes Historical Provider, MD  potassium chloride SA (K-DUR,KLOR-CON) 20 MEQ tablet Take 20 mEq by mouth daily.    Yes Historical Provider, MD  warfarin (COUMADIN) 5 MG tablet Take 5 mg by mouth daily.    Yes Historical Provider, MD  lovastatin (MEVACOR) 20 MG tablet Take 20 mg by mouth at bedtime.    Historical Provider, MD    Current Facility-Administered Medications  Medication Dose Route Frequency Provider Last Rate Last Dose  . cloNIDine (CATAPRES) tablet 0.1 mg  0.1 mg Oral BID Hillary Bow, DO   0.1 mg at 01/23/13 0346  . HYDROcodone-acetaminophen (NORCO/VICODIN) 5-325 MG per tablet 1 tablet  1 tablet Oral BID PRN Hillary Bow, DO      . insulin aspart (novoLOG) injection 0-9 Units  0-9 Units Subcutaneous Q4H Hillary Bow, DO   1 Units at 01/23/13 8295  . labetalol (NORMODYNE,TRANDATE) injection 10 mg  10 mg Intravenous Q2H PRN Hillary Bow, DO      . lisinopril (PRINIVIL,ZESTRIL) tablet 20 mg  20 mg Oral Daily Hillary Bow, DO      . oxybutynin (DITROPAN-XL) 24 hr tablet 5 mg  5 mg Oral Daily Hillary Bow, DO      . simvastatin (ZOCOR) tablet 10 mg  10 mg Oral q1800 Hillary Bow, DO      . sodium chloride 0.9 % injection 3 mL  3 mL Intravenous Q12H Hillary Bow, DO   3 mL at 01/23/13 6213    Allergies as of 01/22/2013 - Review Complete 10/24/2012  Allergen Reaction Noted  . Aspirin Hives  05/22/2010  . Hydrocodone Hives 05/22/2010  . Rofecoxib Hives 05/22/2010    History reviewed. No pertinent family history.  History   Social History  . Marital Status: Widowed    Spouse Name: [name]    Number of Children: [name]  . Years of Education: [name]   Occupational History  . Not on file.   Social History Main Topics  . Smoking status: Never Smoker   . Smokeless tobacco: Never Used  . Alcohol Use: Yes     Comment: occasional   . Drug Use: No  . Sexual Activity: Not on file   Other Topics Concern  . Not on file   Social History Narrative  . No narrative on file    Review of Systems: As per HPI, all others negative  Physical Exam: Vital signs in last 24 hours: Temp:  [97.7 F (36.5 C)-98.4 F (36.9 C)] 97.9 F (36.6 C) (10/20 0830) Pulse  Rate:  [80-131] 93 (10/20 0830) Resp:  [18-20] 20 (10/20 0830) BP: (120-197)/(66-120) 121/66 mmHg (10/20 0830) SpO2:  [98 %-100 %] 100 % (10/20 0830)   General:   Alert,  Well-developed, well-nourished, pleasant and cooperative in NAD Head:  Normocephalic and atraumatic. Eyes:  Sclera clear, no icterus.   Conjunctiva pink. Ears:  Normal auditory acuity. Nose:  No deformity, discharge,  or lesions. Mouth:  No deformity or lesions.  Oropharynx somewhat dry. Neck:  Supple; no masses or thyromegaly. Lungs:  Clear throughout to auscultation.   No wheezes, crackles, or rhonchi. No acute distress. Heart:  Regular rate and rhythm; no murmurs, clicks, rubs,  or gallops. Abdomen:  Soft, nontender, mild distended. No masses, hepatosplenomegaly or hernias noted. Normal bowel sounds, without guarding, and without rebound.     Msk:  Symmetrical without gross deformities. Normal posture. Pulses:  Normal pulses noted. Extremities:  Without clubbing or edema. Neurologic:  Alert and  oriented x4;  Diffusely weak, otherwise grossly normal neurologically. Skin:  Intact without significant lesions or rashes. Cervical Nodes:  No significant cervical adenopathy. Psych:  Alert and cooperative. Normal mood and affect.   Lab Results:  Recent Labs  01/23/13 0017 01/23/13 0243 01/23/13 0547  WBC 6.9 6.4 5.6  HGB 12.7 11.7* 10.5*  HCT 35.8* 34.7* 29.9*  PLT 142* 123* 120*   BMET  Recent Labs  01/23/13 0017 01/23/13 0547  NA 141 140  K 3.8 3.7  CL 105 106  CO2 24 23  GLUCOSE 102* 109*  BUN 17 15  CREATININE 0.95 0.79  CALCIUM 9.4 8.6   LFT  Recent Labs  01/23/13 0017  PROT 8.2  ALBUMIN 3.9  AST 18  ALT 11  ALKPHOS 86  BILITOT 0.6   PT/INR  Recent Labs  01/23/13 0017  LABPROT 22.3*  INR 2.03*    Studies/Results: No results found.  Impression:  1.  GIB in setting of warfarin.  Melena more suggestive of upper GI tract source, though BUN is normal. 2.  Acute blood loss  anemia.  Plan:  1.  PPI. 2.  NPO. 3.  Endoscopy today, after she has finished her second unit of FFP; if endoscopy is unrevealing, will do colonoscopy tomorrow.   LOS: 0 days   Giorgio Chabot M  01/23/2013, 9:28 AM

## 2013-01-24 ENCOUNTER — Encounter (HOSPITAL_COMMUNITY): Payer: Self-pay | Admitting: Gastroenterology

## 2013-01-24 ENCOUNTER — Inpatient Hospital Stay (HOSPITAL_COMMUNITY): Payer: Medicare Other

## 2013-01-24 ENCOUNTER — Encounter (HOSPITAL_COMMUNITY)
Admission: EM | Disposition: A | Discharge status: Home / Self Care | Payer: Medicare Other | Source: Home / Self Care | Attending: Internal Medicine

## 2013-01-24 HISTORY — PX: COLONOSCOPY: SHX5424

## 2013-01-24 LAB — PREPARE FRESH FROZEN PLASMA
Unit division: 0
Unit division: 0
Unit division: 0

## 2013-01-24 LAB — PROTIME-INR: INR: 1.07 (ref 0.00–1.49)

## 2013-01-24 LAB — GLUCOSE, CAPILLARY
Glucose-Capillary: 94 mg/dL (ref 70–99)
Glucose-Capillary: 82 mg/dL (ref 70–99)
Glucose-Capillary: 95 mg/dL (ref 70–99)
Glucose-Capillary: 92 mg/dL (ref 70–99)

## 2013-01-24 SURGERY — COLONOSCOPY
Anesthesia: Moderate Sedation | Laterality: Left

## 2013-01-24 MED ORDER — FENTANYL CITRATE 0.05 MG/ML IJ SOLN
INTRAMUSCULAR | Status: DC | PRN
  Administered 2013-01-24 (×2): 25 ug via INTRAVENOUS

## 2013-01-24 MED ORDER — FENTANYL CITRATE 0.05 MG/ML IJ SOLN
INTRAMUSCULAR | Status: AC
  Filled 2013-01-24: qty 4

## 2013-01-24 MED ORDER — SODIUM CHLORIDE 0.9 % IV SOLN
INTRAVENOUS | Status: DC | PRN
  Administered 2013-01-24: 1000 mL via INTRAVENOUS
  Administered 2013-01-24: 75 mL/h via INTRAVENOUS

## 2013-01-24 MED ORDER — CEFAZOLIN SODIUM-DEXTROSE 2-3 GM-% IV SOLR
INTRAVENOUS | Status: AC
  Administered 2013-01-24: 17:00:00 2000 mg
  Filled 2013-01-24: qty 50

## 2013-01-24 MED ORDER — MIDAZOLAM HCL 5 MG/ML IJ SOLN
INTRAMUSCULAR | Status: AC
  Filled 2013-01-24: qty 2

## 2013-01-24 MED ORDER — CEFAZOLIN SODIUM-DEXTROSE 2-3 GM-% IV SOLR
2.0000 g | Freq: Once | INTRAVENOUS | Status: AC
  Administered 2013-01-24: 2 g via INTRAVENOUS
  Filled 2013-01-24: qty 50

## 2013-01-24 MED ORDER — FENTANYL CITRATE 0.05 MG/ML IJ SOLN
INTRAMUSCULAR | Status: AC
  Filled 2013-01-24: qty 2

## 2013-01-24 MED ORDER — MIDAZOLAM HCL 2 MG/2ML IJ SOLN
INTRAMUSCULAR | Status: AC
  Filled 2013-01-24: qty 4

## 2013-01-24 MED ORDER — TECHNETIUM TC 99M-LABELED RED BLOOD CELLS IV KIT
25.0000 | PACK | Freq: Once | INTRAVENOUS | Status: AC | PRN
  Administered 2013-01-24: 25 via INTRAVENOUS

## 2013-01-24 MED ORDER — FENTANYL CITRATE 0.05 MG/ML IJ SOLN
INTRAMUSCULAR | Status: DC | PRN
  Administered 2013-01-24: 25 ug via INTRAVENOUS

## 2013-01-24 MED ORDER — ALPRAZOLAM 0.25 MG PO TABS: 0.2500 mg | ORAL_TABLET | Freq: Three times a day (TID) | ORAL | Status: DC | PRN

## 2013-01-24 MED ORDER — MIDAZOLAM HCL 2 MG/2ML IJ SOLN
INTRAMUSCULAR | Status: DC | PRN
  Administered 2013-01-24: 1 mg via INTRAVENOUS

## 2013-01-24 MED ORDER — MIDAZOLAM HCL 5 MG/5ML IJ SOLN
INTRAMUSCULAR | Status: DC | PRN
  Administered 2013-01-24 (×2): 2 mg via INTRAVENOUS

## 2013-01-24 MED ORDER — IOHEXOL 300 MG/ML  SOLN
150.0000 mL | Freq: Once | INTRAMUSCULAR | Status: AC | PRN
  Administered 2013-01-24: 16:00:00 70 mL via INTRA_ARTERIAL

## 2013-01-24 NOTE — ED Notes (Signed)
Exoseal closure 5FR by Dulce Sellar, RT

## 2013-01-24 NOTE — ED Notes (Signed)
O2 2L/[place] on 

## 2013-01-24 NOTE — H&P (View-Only) (Signed)
[facility] PROGRESS NOTE  Assessment/Plan: Lower GI bleed: - Consulted GI, EGD 10.20.2014: Gastric erosions could potentially lead to patient's melena and anemia, for colonoscopy on 10.21.2014 - Hemodynamically stable, drop in hbg from 12-> 10.5. - cont to monitor CBC.  HYPERTENSION, BENIGN: - stable, cont home meds.  A.fib: - rate control, was given FFP and vit K to reverse coagulopathy    Code Status: Full Code Family Communication: No family in room  Disposition Plan: Admit to inpatient   Consultants:  [facility]  Procedures:  Colonoscopy  Antibiotics:  None  HPI/Subjective: No complains,very anxious  Objective: Filed Vitals:   01/23/13 2250 01/24/13 0010 01/24/13 0414 01/24/13 0420  BP: 111/65 112/78  111/78  Pulse: 75 92  78  Temp:  98.7 F (37.1 C)  98.6 F (37 C)  TempSrc:    Oral  Resp:  16  15  Weight:   109.272 kg (240 lb 14.4 oz)   SpO2:  98%  99%    Intake/Output Summary (Last 24 hours) at 01/24/13 0853 Last data filed at 01/24/13 0419  Gross per 24 hour  Intake    600 ml  Output   1300 ml  Net   -700 ml   Filed Weights   01/24/13 0414  Weight: 109.272 kg (240 lb 14.4 oz)    Exam:  General: Alert, awake, oriented x3, in no acute distress.  HEENT: No bruits, no goiter.  Heart: Regular rate and rhythm, without murmurs, rubs, gallops.  Lungs: Good air movement, clear to auscultation.  Abdomen: Soft, nontender, nondistended, positive bowel sounds.  Neuro: Grossly intact, nonfocal.   Data Reviewed: Basic Metabolic Panel:  Recent Labs Lab 01/23/13 0017 01/23/13 0547  NA 141 140  K 3.8 3.7  CL 105 106  CO2 24 23  GLUCOSE 102* 109*  BUN 17 15  CREATININE 0.95 0.79  CALCIUM 9.4 8.6   Liver Function Tests:  Recent Labs Lab 01/23/13 0017  AST 18  ALT 11  ALKPHOS 86  BILITOT 0.6  PROT 8.2  ALBUMIN 3.9   No results found for this basename: LIPASE, AMYLASE,  in the last 168 hours No results found for this  basename: AMMONIA,  in the last 168 hours CBC:  Recent Labs Lab 01/23/13 0017 01/23/13 0243 01/23/13 0547  WBC 6.9 6.4 5.6  NEUTROABS 2.4  --   --   HGB 12.7 11.7* 10.5*  HCT 35.8* 34.7* 29.9*  MCV 85.4 85.9 86.9  PLT 142* 123* 120*   Cardiac Enzymes:  Recent Labs Lab 01/23/13 0243  TROPONINI <0.30   BNP (last 3 results) No results found for this basename: PROBNP,  in the last 8760 hours CBG:  Recent Labs Lab 01/23/13 1610 01/23/13 2006 01/24/13 0008 01/24/13 0416 01/24/13 0751  GLUCAP 95 93 94 100* 101*    No results found for this or any previous visit (from the past 240 hour(s)).   Studies: No results found.  Scheduled Meds: . cloNIDine  0.1 mg Oral BID  . insulin aspart  0-9 Units Subcutaneous Q4H  . lisinopril  20 mg Oral Daily  . oxybutynin  5 mg Oral Daily  . simvastatin  10 mg Oral q1800  . sodium chloride  3 mL Intravenous Q12H   Continuous Infusions: . sodium chloride 20 mL/hr (01/23/13 1616)     Radonna Ricker Rosine Beat  [facility] Pager [phone]. If 8PM-8AM, please contact night-coverage at [email or web address], password [facility] 01/24/2013, 8:53 AM  LOS: 1 day

## 2013-01-24 NOTE — Interval H&P Note (Signed)
History and Physical Interval Note:  01/24/2013 11:19 AM  Robin Snow  has presented today for surgery, with the diagnosis of Blood in stool, anemia  The various methods of treatment have been discussed with the patient and family. After consideration of risks, benefits and other options for treatment, the patient has consented to  Procedure(s): COLONOSCOPY (Left) as a surgical intervention .  The patient's history has been reviewed, patient examined, no change in status, stable for surgery.  I have reviewed the patient's chart and labs.  Questions were answered to the patient's satisfaction.     Robin Snow  Assessment:  1.  Blood in stool (melena).   EGD yesterday with gastric erosions and ulcerations, unclear if source. 2.  Acute blood loss anemia.  Plan:  1.  Colonoscopy. 2.  Risks (bleeding, infection, bowel perforation that could require surgery, sedation-related changes in cardiopulmonary systems), benefits (identification and possible treatment of source of symptoms, exclusion of certain causes of symptoms), and alternatives (watchful waiting, radiographic imaging studies, empiric medical treatment) of colonoscopy were explained to patient/family in detail and patient wishes to proceed.

## 2013-01-24 NOTE — Consult Note (Signed)
HPI: Robin Snow is an 72 y.o. female with acute lower GI bleed. She is on chronic Coumadin for afib but developed bloody stools. She has had colonoscopy which could not find source, but confirmed evidence of active bleed. A subsequent NM scan is positive and suggestive of active in the transverse colon. IR is now asked to do mesenteric angio and possible coil embo of bleeding source. Chart, PMHx, and meds reviewed. Last INR was 2.0, pt given FFP and Vit K, repeat INR not yet checked. Pt currently resting in room, appears stable.  Past Medical History:  Past Medical History  Diagnosis Date  . Diabetes mellitus   . Atrial fibrillation   . Hypertension   . Arthritis   . Coagulopathy     Past Surgical History:  Past Surgical History  Procedure Laterality Date  . Cesarean section    . Replacement total knee bilateral    . Tubal ligation    . Esophagogastroduodenoscopy Left 01/23/2013    Procedure: ESOPHAGOGASTRODUODENOSCOPY (EGD);  Surgeon: Willis Modena, MD;  Location: [facility] ENDOSCOPY;  Service: Endoscopy;  Laterality: Left;    Family History: History reviewed. No pertinent family history.  Social History:  reports that she has never smoked. She has never used smokeless tobacco. She reports that she drinks alcohol. She reports that she does not use illicit drugs.  Allergies:  Allergies  Allergen Reactions  . Aspirin Hives  . Hydrocodone Hives  . Rofecoxib Hives    Medications:   Medication List    ASK your doctor about these medications       cloNIDine 0.1 MG tablet  Commonly known as:  CATAPRES  Take 0.1 mg by mouth 2 (two) times daily.     furosemide 40 MG tablet  Commonly known as:  LASIX  Take 40 mg by mouth daily as needed (swelling).     glimepiride 2 MG tablet  Commonly known as:  AMARYL  Take 2 mg by mouth daily before breakfast.     hydrochlorothiazide 12.5 MG capsule  Commonly known as:  MICROZIDE  Take 12.5 mg by mouth daily.     HYDROcodone-acetaminophen 5-325 MG per tablet  Commonly known as:  NORCO/VICODIN  Take 1 tablet by mouth 2 (two) times daily as needed for pain.     lisinopril 20 MG tablet  Commonly known as:  PRINIVIL,ZESTRIL  Take 20 mg by mouth daily.     lovastatin 20 MG tablet  Commonly known as:  MEVACOR  Take 20 mg by mouth at bedtime.     oxybutynin 5 MG 24 hr tablet  Commonly known as:  DITROPAN-XL  Take 5 mg by mouth daily.     potassium chloride SA 20 MEQ tablet  Commonly known as:  K-DUR,KLOR-CON  Take 20 mEq by mouth daily.     warfarin 5 MG tablet  Commonly known as:  COUMADIN  Take 5 mg by mouth daily.        Please HPI for pertinent positives, otherwise complete 10 system ROS negative.  Physical Exam: BP 138/76  Pulse 92  Temp(Src) 98.3 F (36.8 C) (Oral)  Resp 18  Wt 240 lb 14.4 oz (109.272 kg)  BMI 37.72 kg/m2  SpO2 100% Body mass index is 37.72 kg/(m^2).   General Appearance:  Alert, cooperative, no distress, appears stated age  Head:  Normocephalic, without obvious abnormality, atraumatic  ENT: Unremarkable  Neck: Supple, symmetrical, trachea midline  Lungs:   Clear to auscultation bilaterally, no w/r/r.  Chest Wall:  No tenderness or deformity  Heart:  Regular rate and rhythm, S1, S2 normal, no murmur, rub or gallop.  Abdomen:   Soft, non-tender, non distended.  Extremities: Extremities normal, atraumatic, no cyanosis or edema  Pulses: 2+ and symmetric femoral  Neurologic: Normal affect, no gross deficits.   Results for orders placed during the hospital encounter of 01/23/13 (from the past 48 hour(s))  CBC WITH DIFFERENTIAL     Status: Abnormal   Collection Time    01/23/13 12:17 AM      Result Value Range   WBC 6.9  4.0 - 10.5 K/uL   RBC 4.19  3.87 - 5.11 MIL/uL   Hemoglobin 12.7  12.0 - 15.0 g/dL   HCT 30.8 (*) 65.7 - 84.6 %   MCV 85.4  78.0 - 100.0 fL   MCH 30.3  26.0 - 34.0 pg   MCHC 35.5  30.0 - 36.0 g/dL   RDW 96.2  95.2 - 84.1 %   Platelets  142 (*) 150 - 400 K/uL   Neutrophils Relative % 36 (*) 43 - 77 %   Neutro Abs 2.4  1.7 - 7.7 K/uL   Lymphocytes Relative 54 (*) 12 - 46 %   Lymphs Abs 3.7  0.7 - 4.0 K/uL   Monocytes Relative 7  3 - 12 %   Monocytes Absolute 0.5  0.1 - 1.0 K/uL   Eosinophils Relative 3  0 - 5 %   Eosinophils Absolute 0.2  0.0 - 0.7 K/uL   Basophils Relative 0  0 - 1 %   Basophils Absolute 0.0  0.0 - 0.1 K/uL  COMPREHENSIVE METABOLIC PANEL     Status: Abnormal   Collection Time    01/23/13 12:17 AM      Result Value Range   Sodium 141  135 - 145 mEq/L   Potassium 3.8  3.5 - 5.1 mEq/L   Chloride 105  96 - 112 mEq/L   CO2 24  19 - 32 mEq/L   Glucose, Bld 102 (*) 70 - 99 mg/dL   BUN 17  6 - 23 mg/dL   Creatinine, Ser 3.24  0.50 - 1.10 mg/dL   Calcium 9.4  8.4 - 40.1 mg/dL   Total Protein 8.2  6.0 - 8.3 g/dL   Albumin 3.9  3.5 - 5.2 g/dL   AST 18  0 - 37 U/L   ALT 11  0 - 35 U/L   Alkaline Phosphatase 86  39 - 117 U/L   Total Bilirubin 0.6  0.3 - 1.2 mg/dL   GFR calc non Af Amer 58 (*) >90 mL/min   GFR calc Af Amer 67 (*) >90 mL/min   Comment: (NOTE)     The eGFR has been calculated using the CKD EPI equation.     This calculation has not been validated in all clinical situations.     eGFR's persistently <90 mL/min signify possible Chronic Kidney     Disease.  PROTIME-INR     Status: Abnormal   Collection Time    01/23/13 12:17 AM      Result Value Range   Prothrombin Time 22.3 (*) 11.6 - 15.2 seconds   INR 2.03 (*) 0.00 - 1.49  TYPE AND SCREEN     Status: None   Collection Time    01/23/13  2:40 AM      Result Value Range   ABO/RH(D) A POS     Antibody Screen POS     Sample Expiration 01/26/2013  DAT, IgG NEG     Antibody Identification ANTI-Lea (LEWIS a)     PT AG Type NEGATIVE FOR LEWIS A ANTIGEN    PREPARE FRESH FROZEN PLASMA     Status: None   Collection Time    01/23/13  2:40 AM      Result Value Range   Unit Number [id number]     Blood Component Type THAWED PLASMA      Unit division 00     Status of Unit ISSUED,FINAL     Transfusion Status OK TO TRANSFUSE     Unit Number [id number]     Blood Component Type THAWED PLASMA     Unit division 00     Status of Unit ISSUED,FINAL     Transfusion Status OK TO TRANSFUSE     Unit Number [id number]     Blood Component Type THAWED PLASMA     Unit division 00     Status of Unit ISSUED,FINAL     Transfusion Status OK TO TRANSFUSE     Unit Number [id number]     Blood Component Type THAWED PLASMA     Unit division 00     Status of Unit ISSUED,FINAL     Transfusion Status OK TO TRANSFUSE    CBC     Status: Abnormal   Collection Time    01/23/13  2:43 AM      Result Value Range   WBC 6.4  4.0 - 10.5 K/uL   RBC 4.04  3.87 - 5.11 MIL/uL   Hemoglobin 11.7 (*) 12.0 - 15.0 g/dL   HCT 34.7 (*) 42.5 - 95.6 %   MCV 85.9  78.0 - 100.0 fL   MCH 29.0  26.0 - 34.0 pg   MCHC 33.7  30.0 - 36.0 g/dL   RDW 38.7  56.4 - 33.2 %   Platelets 123 (*) 150 - 400 K/uL  TROPONIN I     Status: None   Collection Time    01/23/13  2:43 AM      Result Value Range   Troponin I <0.30  <0.30 ng/mL   Comment:            Due to the release kinetics of cTnI,     a negative result within the first hours     of the onset of symptoms does not rule out     myocardial infarction with certainty.     If myocardial infarction is still suspected,     repeat the test at appropriate intervals.  GLUCOSE, CAPILLARY     Status: Abnormal   Collection Time    01/23/13  4:53 AM      Result Value Range   Glucose-Capillary 123 (*) 70 - 99 mg/dL  CBC     Status: Abnormal   Collection Time    01/23/13  5:47 AM      Result Value Range   WBC 5.6  4.0 - 10.5 K/uL   RBC 3.44 (*) 3.87 - 5.11 MIL/uL   Hemoglobin 10.5 (*) 12.0 - 15.0 g/dL   HCT 95.1 (*) 88.4 - 16.6 %   MCV 86.9  78.0 - 100.0 fL   MCH 30.5  26.0 - 34.0 pg   MCHC 35.1  30.0 - 36.0 g/dL   RDW 06.3  01.6 - 01.0 %   Platelets 120 (*) 150 - 400 K/uL  BASIC METABOLIC PANEL      Status: Abnormal   Collection Time    01/23/13  5:47  AM      Result Value Range   Sodium 140  135 - 145 mEq/L   Potassium 3.7  3.5 - 5.1 mEq/L   Chloride 106  96 - 112 mEq/L   CO2 23  19 - 32 mEq/L   Glucose, Bld 109 (*) 70 - 99 mg/dL   BUN 15  6 - 23 mg/dL   Creatinine, Ser 1.61  0.50 - 1.10 mg/dL   Calcium 8.6  8.4 - 09.6 mg/dL   GFR calc non Af Amer 81 (*) >90 mL/min   GFR calc Af Amer >90  >90 mL/min   Comment: (NOTE)     The eGFR has been calculated using the CKD EPI equation.     This calculation has not been validated in all clinical situations.     eGFR's persistently <90 mL/min signify possible Chronic Kidney     Disease.  GLUCOSE, CAPILLARY     Status: None   Collection Time    01/23/13  8:09 AM      Result Value Range   Glucose-Capillary 91  70 - 99 mg/dL  GLUCOSE, CAPILLARY     Status: None   Collection Time    01/23/13 11:46 AM      Result Value Range   Glucose-Capillary 90  70 - 99 mg/dL  GLUCOSE, CAPILLARY     Status: None   Collection Time    01/23/13  4:10 PM      Result Value Range   Glucose-Capillary 95  70 - 99 mg/dL   Comment 1 Notify RN    GLUCOSE, CAPILLARY     Status: None   Collection Time    01/23/13  8:06 PM      Result Value Range   Glucose-Capillary 93  70 - 99 mg/dL   Comment 1 Notify RN     Comment 2 Documented in Chart    GLUCOSE, CAPILLARY     Status: None   Collection Time    01/24/13 12:08 AM      Result Value Range   Glucose-Capillary 94  70 - 99 mg/dL   Comment 1 Notify RN     Comment 2 Documented in Chart    GLUCOSE, CAPILLARY     Status: Abnormal   Collection Time    01/24/13  4:16 AM      Result Value Range   Glucose-Capillary 100 (*) 70 - 99 mg/dL   Comment 1 Notify RN     Comment 2 Documented in Chart    GLUCOSE, CAPILLARY     Status: Abnormal   Collection Time    01/24/13  7:51 AM      Result Value Range   Glucose-Capillary 101 (*) 70 - 99 mg/dL   Comment 1 Notify RN    GLUCOSE, CAPILLARY     Status: None    Collection Time    01/24/13 10:21 AM      Result Value Range   Glucose-Capillary 82  70 - 99 mg/dL   Nm Gi Blood Loss  04/54/0981   CLINICAL DATA:  Rectal bleeding.  EXAM: NUCLEAR MEDICINE GASTROINTESTINAL BLEEDING SCAN  TECHNIQUE: Sequential abdominal images were obtained following intravenous administration of Tc-87m labeled red blood cells.  COMPARISON:  CT 12/03/2011  RADIOPHARMACEUTICALS:  CURIE ULTRATAG TECHNETIUM TC 19M-LABELED RED BLOOD CELLS IV KITmCi Tc-6m in-vitro labeled red cells.  FINDINGS: Periodic activities noted in the region of the stomach passing into the proximal duodenum, likely related to free technetium.  There is  also abnormal activity noted corresponding to the transverse colon on prior CT compatible with active GI bleeding.  IMPRESSION: Active GI bleed noted in the region of the transverse colon.  Critical Value/emergent results were called by telephone at the time of interpretation on 01/24/2013 at 2:57 PM to Dr.WILLIAM OUTLAW , who verbally acknowledged these results.   Electronically Signed   By: Charlett Nose M.D.   On: 01/24/2013 15:08    Assessment/Plan Lower GI bleed with positive NM scan For Mesenteric arteriogram and possible coil embo. Discussed procedure with patient as well as daughter(via phone) Explained procedure, risks, complications, use of sedation. Need to check stat PT/INR to assess adequacy of reversal. Labs reviewed. Consent signed in chart  Brayton El PA-C 01/24/2013, 3:47 PM

## 2013-01-24 NOTE — Procedures (Signed)
Mesenteric angiogram for GI bleeding Negative study No comp

## 2013-01-24 NOTE — Progress Notes (Signed)
Patient colonoscopy complete.  Ready to go to nuclear med for tagged rbc nuclear scan.  VS 146/73 81, atrial fib, 100 2 sat.

## 2013-01-24 NOTE — Op Note (Signed)
[facility] [address] [place], [address]   COLONOSCOPY PROCEDURE REPORT  PATIENT: Robin, Snow  MR#: [id number] BIRTHDATE: [birth year] , 72  yrs. old GENDER: Female ENDOSCOPIST: Willis Modena, MD REFERRED QI:[facility] PROCEDURE DATE:  01/24/2013 PROCEDURE:   Colonoscopy, diagnostic ASA CLASS:   Class III INDICATIONS:blood in stool, anemia. MEDICATIONS: Fentanyl 50 mcg IV and Versed 4 mg IV  DESCRIPTION OF PROCEDURE:   After the risks benefits and alternatives of the procedure were thoroughly explained, informed consent was obtained.  A digital rectal exam revealed external hemorrhoids, otherwise no abnormalities of the rectum.   The Pentax Ultra Slim [id number]  endoscope was introduced through the anus and advanced to the cecum, which was identified by both the appendix and ileocecal valve. No adverse events experienced.   The quality of the prep was fair, using Colyte  The instrument was then slowly withdrawn as the colon was fully examined.     Findings:  External hemorrhoids, otherwise normal digital rectal exam.  Lots of old and fresh blood throughout the colon, especially in the transverse and descending colon.  Multiple diverticula throughout the colon.  No discrete bleeding site was identified. Blood obscured multiple views throughout the colon; today's exam was inadequate for screening purposes.   Retroflexed view of rectum was normal.      .  The scope was withdrawn and the procedure completed.  ENDOSCOPIC IMPRESSION:     As above.  Suspect bleeding is diverticular in origin.  There is fresh ongoing bleeding at this time.  RECOMMENDATIONS:     1.  Watch for potential complications of procedure. 2.  Stat INR now (INR 2.0 yesterday, she has received Vit K and 2U FFP subsequently). 3.  Patient will go to nuclear medicine directly from endoscopy, for tagged RBC study. 4.  If tagged RBC study is positive, would do  angiogram with possible embolization as next step in management. 5.  [facility] to follow.  eSigned:  Willis Modena, MD 01/24/2013 11:58 AM   cc:

## 2013-01-24 NOTE — Progress Notes (Signed)
[facility] PROGRESS NOTE  Assessment/Plan: Lower GI bleed: - Consulted GI, EGD 10.20.2014: Gastric erosions could potentially lead to patient's melena and anemia, for colonoscopy on 10.21.2014 - Hemodynamically stable, drop in hbg from 12-> 10.5. - cont to monitor CBC.  HYPERTENSION, BENIGN: - stable, cont home meds.  A.fib: - rate control, was given FFP and vit K to reverse coagulopathy    Code Status: Full Code Family Communication: No family in room  Disposition Plan: Admit to inpatient   Consultants:  [facility]  Procedures:  Colonoscopy  Antibiotics:  None  HPI/Subjective: No complains,very anxious  Objective: Filed Vitals:   01/23/13 2250 01/24/13 0010 01/24/13 0414 01/24/13 0420  BP: 111/65 112/78  111/78  Pulse: 75 92  78  Temp:  98.7 F (37.1 C)  98.6 F (37 C)  TempSrc:    Oral  Resp:  16  15  Weight:   109.272 kg (240 lb 14.4 oz)   SpO2:  98%  99%    Intake/Output Summary (Last 24 hours) at 01/24/13 0853 Last data filed at 01/24/13 0419  Gross per 24 hour  Intake    600 ml  Output   1300 ml  Net   -700 ml   Filed Weights   01/24/13 0414  Weight: 109.272 kg (240 lb 14.4 oz)    Exam:  General: Alert, awake, oriented x3, in no acute distress.  HEENT: No bruits, no goiter.  Heart: Regular rate and rhythm, without murmurs, rubs, gallops.  Lungs: Good air movement, clear to auscultation.  Abdomen: Soft, nontender, nondistended, positive bowel sounds.  Neuro: Grossly intact, nonfocal.   Data Reviewed: Basic Metabolic Panel:  Recent Labs Lab 01/23/13 0017 01/23/13 0547  NA 141 140  K 3.8 3.7  CL 105 106  CO2 24 23  GLUCOSE 102* 109*  BUN 17 15  CREATININE 0.95 0.79  CALCIUM 9.4 8.6   Liver Function Tests:  Recent Labs Lab 01/23/13 0017  AST 18  ALT 11  ALKPHOS 86  BILITOT 0.6  PROT 8.2  ALBUMIN 3.9   No results found for this basename: LIPASE, AMYLASE,  in the last 168 hours No results found for this  basename: AMMONIA,  in the last 168 hours CBC:  Recent Labs Lab 01/23/13 0017 01/23/13 0243 01/23/13 0547  WBC 6.9 6.4 5.6  NEUTROABS 2.4  --   --   HGB 12.7 11.7* 10.5*  HCT 35.8* 34.7* 29.9*  MCV 85.4 85.9 86.9  PLT 142* 123* 120*   Cardiac Enzymes:  Recent Labs Lab 01/23/13 0243  TROPONINI <0.30   BNP (last 3 results) No results found for this basename: PROBNP,  in the last 8760 hours CBG:  Recent Labs Lab 01/23/13 1610 01/23/13 2006 01/24/13 0008 01/24/13 0416 01/24/13 0751  GLUCAP 95 93 94 100* 101*    No results found for this or any previous visit (from the past 240 hour(s)).   Studies: No results found.  Scheduled Meds: . cloNIDine  0.1 mg Oral BID  . insulin aspart  0-9 Units Subcutaneous Q4H  . lisinopril  20 mg Oral Daily  . oxybutynin  5 mg Oral Daily  . simvastatin  10 mg Oral q1800  . sodium chloride  3 mL Intravenous Q12H   Continuous Infusions: . sodium chloride 20 mL/hr (01/23/13 1616)     FELIZ ORTIZ, Dyamond Tolosa  [facility] Pager [phone]. If 8PM-8AM, please contact night-coverage at [email or web address], password [email or web address] 01/24/2013, 8:53 AM  LOS: 1 day        

## 2013-01-25 ENCOUNTER — Encounter (HOSPITAL_COMMUNITY): Payer: Self-pay | Admitting: Gastroenterology

## 2013-01-25 DIAGNOSIS — D62 Acute posthemorrhagic anemia: Secondary | ICD-10-CM

## 2013-01-25 DIAGNOSIS — T45515A Adverse effect of anticoagulants, initial encounter: Secondary | ICD-10-CM

## 2013-01-25 LAB — CBC
MCH: 31 pg (ref 26.0–34.0)
MCV: 88.4 fL (ref 78.0–100.0)
Platelets: 109 10*3/uL — ABNORMAL LOW (ref 150–400)
RDW: 14.4 % (ref 11.5–15.5)
WBC: 5.5 10*3/uL (ref 4.0–10.5)

## 2013-01-25 LAB — GLUCOSE, CAPILLARY
Glucose-Capillary: 104 mg/dL — ABNORMAL HIGH (ref 70–99)
Glucose-Capillary: 107 mg/dL — ABNORMAL HIGH (ref 70–99)
Glucose-Capillary: 112 mg/dL — ABNORMAL HIGH (ref 70–99)

## 2013-01-25 MED ORDER — FUROSEMIDE 10 MG/ML IJ SOLN
20.0000 mg | Freq: Once | INTRAMUSCULAR | Status: AC
  Administered 2013-01-25: 20 mg via INTRAVENOUS

## 2013-01-25 NOTE — Progress Notes (Addendum)
[facility] PROGRESS NOTE  Assessment/Plan: Lower GI bleed/acute blood loss anemia: - Consulted GI, EGD 10.20.2014: Gastric erosions could potentially lead to patient's melena and anemia, colonoscopy on 10.21.2014 demonstrated signs of active bleeding, no source identified. Patient had NM which was positive for suspected bleeding in transverse colon, but subsequent aortogram was negative. -Hemodynamically stable, drop in hbg from 12-> 10.5 >7.5 -no further bleeding -will transfuse 2 units of PRBC's -cont to monitor CBC.  HYPERTENSION, BENIGN: -stable, cont home meds. -will monitor closely as BP is slightly soft  A.fib: -rate control, was given FFP and vit K to reverse coagulopathy -INR now subtherapeutic -per GI rec's will hold coumadin for 2 weeks  HLD -continue statins  DM -continue SSI  Code Status: Full Code Family Communication: No family in room  Disposition Plan: home in 1-2 days.  Consultants:  [facility]  Procedures:  Colonoscopy  See below for xray reports  Antibiotics:  None  HPI/Subjective: Feeling better; just weak and tired. She is hungry. No further bleeding overnight  Objective: Filed Vitals:   01/24/13 2000 01/25/13 0241 01/25/13 0442 01/25/13 1456  BP: 138/90  124/73 109/55  Pulse: 92  99 72  Temp:   98.3 F (36.8 C) 98.7 F (37.1 C)  TempSrc:   Oral Oral  Resp: 20  18 18   Height:  5\' 5"  (1.651 m)    Weight:   106.731 kg (235 lb 4.8 oz)   SpO2: 100%  100% 100%    Intake/Output Summary (Last 24 hours) at 01/25/13 1507 Last data filed at 01/25/13 1457  Gross per 24 hour  Intake 2063.79 ml  Output      0 ml  Net 2063.79 ml   Filed Weights   01/24/13 0414 01/25/13 0442  Weight: 109.272 kg (240 lb 14.4 oz) 106.731 kg (235 lb 4.8 oz)    Exam:  General: Alert, awake, oriented x3, in no acute distress.  HEENT: No bruits, no goiter.  Heart: Regular rate and rhythm, without murmurs, rubs, gallops.  Lungs: Good air movement,  clear to auscultation.  Abdomen: Soft, nontender, nondistended, positive bowel sounds.  Neuro: Grossly intact, nonfocal.   Data Reviewed: Basic Metabolic Panel:  Recent Labs Lab 01/23/13 0017 01/23/13 0547  NA 141 140  K 3.8 3.7  CL 105 106  CO2 24 23  GLUCOSE 102* 109*  BUN 17 15  CREATININE 0.95 0.79  CALCIUM 9.4 8.6   Liver Function Tests:  Recent Labs Lab 01/23/13 0017  AST 18  ALT 11  ALKPHOS 86  BILITOT 0.6  PROT 8.2  ALBUMIN 3.9   CBC:  Recent Labs Lab 01/23/13 0017 01/23/13 0243 01/23/13 0547 01/25/13 0530  WBC 6.9 6.4 5.6 5.5  NEUTROABS 2.4  --   --   --   HGB 12.7 11.7* 10.5* 7.5*  HCT 35.8* 34.7* 29.9* 21.4*  MCV 85.4 85.9 86.9 88.4  PLT 142* 123* 120* 109*   Cardiac Enzymes:  Recent Labs Lab 01/23/13 0243  TROPONINI <0.30   CBG:  Recent Labs Lab 01/24/13 2017 01/25/13 0013 01/25/13 0408 01/25/13 0808 01/25/13 1118  GLUCAP 102* 107* 104* 140* 94   Studies: Nm Gi Blood Loss  01/24/2013   CLINICAL DATA:  Rectal bleeding.  EXAM: NUCLEAR MEDICINE GASTROINTESTINAL BLEEDING SCAN  TECHNIQUE: Sequential abdominal images were obtained following intravenous administration of Tc-9m labeled red blood cells.  COMPARISON:  CT 12/03/2011  RADIOPHARMACEUTICALS:  CURIE ULTRATAG TECHNETIUM TC 28M-LABELED RED BLOOD CELLS IV KITmCi Tc-5m in-vitro labeled red  cells.  FINDINGS: Periodic activities noted in the region of the stomach passing into the proximal duodenum, likely related to free technetium.  There is also abnormal activity noted corresponding to the transverse colon on prior CT compatible with active GI bleeding.  IMPRESSION: Active GI bleed noted in the region of the transverse colon.  Critical Value/emergent results were called by telephone at the time of interpretation on 01/24/2013 at 2:57 PM to Dr.WILLIAM OUTLAW , who verbally acknowledged these results.   Electronically Signed   By: Charlett Nose M.D.   On: 01/24/2013 15:08   Ir  Angiogram Visceral Selective  01/24/2013   CLINICAL DATA:  Gastrointestinal bleeding. Nuclear medicine study demonstrates acute bleeding in the transverse colon.  EXAM: SELECTIVE VISCERAL ARTERIOGRAPHY; IR ULTRASOUND GUIDANCE VASC ACCESS RIGHT  MEDICATIONS AND MEDICAL HISTORY: Versed 1 mg, Fentanyl 25 mcg.  Additional Medications: Cefazolin 1 g.  ANESTHESIA/SEDATION: Moderate sedation time: 22 minutes  CONTRAST:  70 cc Omnipaque 300  FLUOROSCOPY TIME:  4.0  minutes  PROCEDURE: The procedure, risks, benefits, and alternatives were explained to the patient. Questions regarding the procedure were encouraged and answered. The patient understands and consents to the procedure.  The right groin was prepped with Betadine in a sterile fashion, and a sterile drape was applied covering the operative field. A sterile gown and sterile gloves were used for the procedure.  Under sonographic guidance, a micropuncture needle was inserted into the common femoral artery and removed over a 018 wire. This was up sized to a Bentson. A 5 French sheath was inserted. A sauce Omni catheter was advanced into the aorta. Within the thoracic aorta, it was performed. The SMA was selected and angiography was performed centering over the right upper quadrant and left upper quadrants. The IMA was then selected and imaging was performed. The sauce Omni catheter was retracted into the right external iliac artery and femoral angiography was performed prior to deployment of the Exoseal device.  COMPLICATIONS: None  FINDINGS: The SMA is patent. Injection demonstrates no source of gastrointestinal bleeding. Specifically, there is no evidence of contrast extravasation, pseudoaneurysm, or vascular malformation. The portal vein is patent.  Similarly, injection of the IMA demonstrates patency of the vessel and no source of gastrointestinal bleeding.  IMPRESSION: The SMA and IMA or both injected and there was no visualize source of gastrointestinal  bleeding.   Electronically Signed   By: Maryclare Bean M.D.   On: 01/24/2013 17:10   Ir Angiogram Visceral Selective  01/24/2013   CLINICAL DATA:  Gastrointestinal bleeding. Nuclear medicine study demonstrates acute bleeding in the transverse colon.  EXAM: SELECTIVE VISCERAL ARTERIOGRAPHY; IR ULTRASOUND GUIDANCE VASC ACCESS RIGHT  MEDICATIONS AND MEDICAL HISTORY: Versed 1 mg, Fentanyl 25 mcg.  Additional Medications: Cefazolin 1 g.  ANESTHESIA/SEDATION: Moderate sedation time: 22 minutes  CONTRAST:  70 cc Omnipaque 300  FLUOROSCOPY TIME:  4.0  minutes  PROCEDURE: The procedure, risks, benefits, and alternatives were explained to the patient. Questions regarding the procedure were encouraged and answered. The patient understands and consents to the procedure.  The right groin was prepped with Betadine in a sterile fashion, and a sterile drape was applied covering the operative field. A sterile gown and sterile gloves were used for the procedure.  Under sonographic guidance, a micropuncture needle was inserted into the common femoral artery and removed over a 018 wire. This was up sized to a Bentson. A 5 French sheath was inserted. A sauce Omni catheter was advanced into the aorta. Within the  thoracic aorta, it was performed. The SMA was selected and angiography was performed centering over the right upper quadrant and left upper quadrants. The IMA was then selected and imaging was performed. The sauce Omni catheter was retracted into the right external iliac artery and femoral angiography was performed prior to deployment of the Exoseal device.  COMPLICATIONS: None  FINDINGS: The SMA is patent. Injection demonstrates no source of gastrointestinal bleeding. Specifically, there is no evidence of contrast extravasation, pseudoaneurysm, or vascular malformation. The portal vein is patent.  Similarly, injection of the IMA demonstrates patency of the vessel and no source of gastrointestinal bleeding.  IMPRESSION: The SMA  and IMA or both injected and there was no visualize source of gastrointestinal bleeding.   Electronically Signed   By: Maryclare Bean M.D.   On: 01/24/2013 17:10   Ir US Guide Vasc Access Right  01/24/2013   CLINICAL DATA:  Gastrointestinal bleeding. Nuclear medicine study demonstrates acute bleeding in the transverse colon.  EXAM: SELECTIVE VISCERAL ARTERIOGRAPHY; IR ULTRASOUND GUIDANCE VASC ACCESS RIGHT  MEDICATIONS AND MEDICAL HISTORY: Versed 1 mg, Fentanyl 25 mcg.  Additional Medications: Cefazolin 1 g.  ANESTHESIA/SEDATION: Moderate sedation time: 22 minutes  CONTRAST:  70 cc Omnipaque 300  FLUOROSCOPY TIME:  4.0  minutes  PROCEDURE: The procedure, risks, benefits, and alternatives were explained to the patient. Questions regarding the procedure were encouraged and answered. The patient understands and consents to the procedure.  The right groin was prepped with Betadine in a sterile fashion, and a sterile drape was applied covering the operative field. A sterile gown and sterile gloves were used for the procedure.  Under sonographic guidance, a micropuncture needle was inserted into the common femoral artery and removed over a 018 wire. This was up sized to a Bentson. A 5 French sheath was inserted. A sauce Omni catheter was advanced into the aorta. Within the thoracic aorta, it was performed. The SMA was selected and angiography was performed centering over the right upper quadrant and left upper quadrants. The IMA was then selected and imaging was performed. The sauce Omni catheter was retracted into the right external iliac artery and femoral angiography was performed prior to deployment of the Exoseal device.  COMPLICATIONS: None  FINDINGS: The SMA is patent. Injection demonstrates no source of gastrointestinal bleeding. Specifically, there is no evidence of contrast extravasation, pseudoaneurysm, or vascular malformation. The portal vein is patent.  Similarly, injection of the IMA demonstrates patency of  the vessel and no source of gastrointestinal bleeding.  IMPRESSION: The SMA and IMA or both injected and there was no visualize source of gastrointestinal bleeding.   Electronically Signed   By: Maryclare Bean M.D.   On: 01/24/2013 17:10    Scheduled Meds: . cloNIDine  0.1 mg Oral BID  . furosemide  20 mg Intravenous Once  . insulin aspart  0-9 Units Subcutaneous Q4H  . lisinopril  20 mg Oral Daily  . oxybutynin  5 mg Oral Daily  . simvastatin  10 mg Oral q1800  . sodium chloride  3 mL Intravenous Q12H   Continuous Infusions: . sodium chloride 1,000 mL (01/24/13 2057)     Vartan Kerins  [facility] Pager [phone]. If 8PM-8AM, please contact night-coverage at [email or web address], password [facility] 01/25/2013, 3:07 PM  LOS: 2 days

## 2013-01-25 NOTE — Progress Notes (Signed)
Subjective: Pt feels ok, reports no further bloody BMs since yesterday. Angio was negative for finding source of bleed. SHe has tolerated clears diet. Denies pain in right groin.  Objective: Physical Exam: BP 124/73  Pulse 99  Temp(Src) 98.3 F (36.8 C) (Oral)  Resp 18  Ht 5\' 5"  (1.651 m)  Wt 235 lb 4.8 oz (106.731 kg)  BMI 39.16 kg/m2  SpO2 100% Rt femral art access site clean, dry, no hematoma Legs warm, good distal pulses. Abd: soft, NT    Labs: CBC  Recent Labs  01/23/13 0547 01/25/13 0530  WBC 5.6 5.5  HGB 10.5* 7.5*  HCT 29.9* 21.4*  PLT 120* PENDING   BMET  Recent Labs  01/23/13 0017 01/23/13 0547  NA 141 140  K 3.8 3.7  CL 105 106  CO2 24 23  GLUCOSE 102* 109*  BUN 17 15  CREATININE 0.95 0.79  CALCIUM 9.4 8.6   LFT  Recent Labs  01/23/13 0017  PROT 8.2  ALBUMIN 3.9  AST 18  ALT 11  ALKPHOS 86  BILITOT 0.6   PT/INR  Recent Labs  01/23/13 0017 01/24/13 1530  LABPROT 22.3* 13.7  INR 2.03* 1.07     Studies/Results: Nm Gi Blood Loss  01/24/2013   CLINICAL DATA:  Rectal bleeding.  EXAM: NUCLEAR MEDICINE GASTROINTESTINAL BLEEDING SCAN  TECHNIQUE: Sequential abdominal images were obtained following intravenous administration of Tc-53m labeled red blood cells.  COMPARISON:  CT 12/03/2011  RADIOPHARMACEUTICALS:  CURIE ULTRATAG TECHNETIUM TC 89M-LABELED RED BLOOD CELLS IV KITmCi Tc-66m in-vitro labeled red cells.  FINDINGS: Periodic activities noted in the region of the stomach passing into the proximal duodenum, likely related to free technetium.  There is also abnormal activity noted corresponding to the transverse colon on prior CT compatible with active GI bleeding.  IMPRESSION: Active GI bleed noted in the region of the transverse colon.  Critical Value/emergent results were called by telephone at the time of interpretation on 01/24/2013 at 2:57 PM to Dr.WILLIAM OUTLAW , who verbally acknowledged these results.   Electronically  Signed   By: Charlett Nose M.D.   On: 01/24/2013 15:08   Ir Angiogram Visceral Selective  01/24/2013   CLINICAL DATA:  Gastrointestinal bleeding. Nuclear medicine study demonstrates acute bleeding in the transverse colon.  EXAM: SELECTIVE VISCERAL ARTERIOGRAPHY; IR ULTRASOUND GUIDANCE VASC ACCESS RIGHT  MEDICATIONS AND MEDICAL HISTORY: Versed 1 mg, Fentanyl 25 mcg.  Additional Medications: Cefazolin 1 g.  ANESTHESIA/SEDATION: Moderate sedation time: 22 minutes  CONTRAST:  70 cc Omnipaque 300  FLUOROSCOPY TIME:  4.0  minutes  PROCEDURE: The procedure, risks, benefits, and alternatives were explained to the patient. Questions regarding the procedure were encouraged and answered. The patient understands and consents to the procedure.  The right groin was prepped with Betadine in a sterile fashion, and a sterile drape was applied covering the operative field. A sterile gown and sterile gloves were used for the procedure.  Under sonographic guidance, a micropuncture needle was inserted into the common femoral artery and removed over a 018 wire. This was up sized to a Bentson. A 5 French sheath was inserted. A sauce Omni catheter was advanced into the aorta. Within the thoracic aorta, it was performed. The SMA was selected and angiography was performed centering over the right upper quadrant and left upper quadrants. The IMA was then selected and imaging was performed. The sauce Omni catheter was retracted into the right external iliac artery and femoral angiography was performed prior to deployment of  the Exoseal device.  COMPLICATIONS: None  FINDINGS: The SMA is patent. Injection demonstrates no source of gastrointestinal bleeding. Specifically, there is no evidence of contrast extravasation, pseudoaneurysm, or vascular malformation. The portal vein is patent.  Similarly, injection of the IMA demonstrates patency of the vessel and no source of gastrointestinal bleeding.  IMPRESSION: The SMA and IMA or both injected  and there was no visualize source of gastrointestinal bleeding.   Electronically Signed   By: Maryclare Bean M.D.   On: 01/24/2013 17:10   Ir Angiogram Visceral Selective  01/24/2013   CLINICAL DATA:  Gastrointestinal bleeding. Nuclear medicine study demonstrates acute bleeding in the transverse colon.  EXAM: SELECTIVE VISCERAL ARTERIOGRAPHY; IR ULTRASOUND GUIDANCE VASC ACCESS RIGHT  MEDICATIONS AND MEDICAL HISTORY: Versed 1 mg, Fentanyl 25 mcg.  Additional Medications: Cefazolin 1 g.  ANESTHESIA/SEDATION: Moderate sedation time: 22 minutes  CONTRAST:  70 cc Omnipaque 300  FLUOROSCOPY TIME:  4.0  minutes  PROCEDURE: The procedure, risks, benefits, and alternatives were explained to the patient. Questions regarding the procedure were encouraged and answered. The patient understands and consents to the procedure.  The right groin was prepped with Betadine in a sterile fashion, and a sterile drape was applied covering the operative field. A sterile gown and sterile gloves were used for the procedure.  Under sonographic guidance, a micropuncture needle was inserted into the common femoral artery and removed over a 018 wire. This was up sized to a Bentson. A 5 French sheath was inserted. A sauce Omni catheter was advanced into the aorta. Within the thoracic aorta, it was performed. The SMA was selected and angiography was performed centering over the right upper quadrant and left upper quadrants. The IMA was then selected and imaging was performed. The sauce Omni catheter was retracted into the right external iliac artery and femoral angiography was performed prior to deployment of the Exoseal device.  COMPLICATIONS: None  FINDINGS: The SMA is patent. Injection demonstrates no source of gastrointestinal bleeding. Specifically, there is no evidence of contrast extravasation, pseudoaneurysm, or vascular malformation. The portal vein is patent.  Similarly, injection of the IMA demonstrates patency of the vessel and no source  of gastrointestinal bleeding.  IMPRESSION: The SMA and IMA or both injected and there was no visualize source of gastrointestinal bleeding.   Electronically Signed   By: Maryclare Bean M.D.   On: 01/24/2013 17:10   Ir US Guide Vasc Access Right  01/24/2013   CLINICAL DATA:  Gastrointestinal bleeding. Nuclear medicine study demonstrates acute bleeding in the transverse colon.  EXAM: SELECTIVE VISCERAL ARTERIOGRAPHY; IR ULTRASOUND GUIDANCE VASC ACCESS RIGHT  MEDICATIONS AND MEDICAL HISTORY: Versed 1 mg, Fentanyl 25 mcg.  Additional Medications: Cefazolin 1 g.  ANESTHESIA/SEDATION: Moderate sedation time: 22 minutes  CONTRAST:  70 cc Omnipaque 300  FLUOROSCOPY TIME:  4.0  minutes  PROCEDURE: The procedure, risks, benefits, and alternatives were explained to the patient. Questions regarding the procedure were encouraged and answered. The patient understands and consents to the procedure.  The right groin was prepped with Betadine in a sterile fashion, and a sterile drape was applied covering the operative field. A sterile gown and sterile gloves were used for the procedure.  Under sonographic guidance, a micropuncture needle was inserted into the common femoral artery and removed over a 018 wire. This was up sized to a Bentson. A 5 French sheath was inserted. A sauce Omni catheter was advanced into the aorta. Within the thoracic aorta, it was performed. The SMA was selected  and angiography was performed centering over the right upper quadrant and left upper quadrants. The IMA was then selected and imaging was performed. The sauce Omni catheter was retracted into the right external iliac artery and femoral angiography was performed prior to deployment of the Exoseal device.  COMPLICATIONS: None  FINDINGS: The SMA is patent. Injection demonstrates no source of gastrointestinal bleeding. Specifically, there is no evidence of contrast extravasation, pseudoaneurysm, or vascular malformation. The portal vein is patent.   Similarly, injection of the IMA demonstrates patency of the vessel and no source of gastrointestinal bleeding.  IMPRESSION: The SMA and IMA or both injected and there was no visualize source of gastrointestinal bleeding.   Electronically Signed   By: Maryclare Bean M.D.   On: 01/24/2013 17:10    Assessment/Plan: Lower GI bleed. Hgb dropped to 7.5, could related to blood loss yesterday, but further hematochezia since yesterday S/p mesenteric angio, negative for source. Hopefully bleeding has ceased now that anticoagulation reversed    LOS: 2 days    Brayton El PA-C 01/25/2013 8:49 AM

## 2013-01-25 NOTE — Progress Notes (Signed)
Subjective: No further bleeding overnight. Is tolerating liquid diet.  Objective: Vital signs in last 24 hours: Temp:  [98.3 F (36.8 C)] 98.3 F (36.8 C) (10/22 0442) Pulse Rate:  [72-107] 99 (10/22 0442) Resp:  [9-34] 18 (10/22 0442) BP: (109-170)/(65-107) 124/73 mmHg (10/22 0442) SpO2:  [97 %-100 %] 100 % (10/22 0442) Weight:  [106.731 kg (235 lb 4.8 oz)] 106.731 kg (235 lb 4.8 oz) (10/22 0442) Weight change: -2.54 kg (-5 lb 9.6 oz) Last BM Date: 01/24/13  PE: GEN:  NAD ABD:  Soft  Lab Results: CBC    Component Value Date/Time   WBC 5.5 01/25/2013 0530   RBC 2.42* 01/25/2013 0530   HGB 7.5* 01/25/2013 0530   HCT 21.4* 01/25/2013 0530   PLT 109* 01/25/2013 0530   MCV 88.4 01/25/2013 0530   MCH 31.0 01/25/2013 0530   MCHC 35.0 01/25/2013 0530   RDW 14.4 01/25/2013 0530   LYMPHSABS 3.7 01/23/2013 0017   MONOABS 0.5 01/23/2013 0017   EOSABS 0.2 01/23/2013 0017   BASOSABS 0.0 01/23/2013 0017   CMP     Component Value Date/Time   NA 140 01/23/2013 0547   K 3.7 01/23/2013 0547   CL 106 01/23/2013 0547   CO2 23 01/23/2013 0547   GLUCOSE 109* 01/23/2013 0547   BUN 15 01/23/2013 0547   CREATININE 0.79 01/23/2013 0547   CALCIUM 8.6 01/23/2013 0547   PROT 8.2 01/23/2013 0017   ALBUMIN 3.9 01/23/2013 0017   AST 18 01/23/2013 0017   ALT 11 01/23/2013 0017   ALKPHOS 86 01/23/2013 0017   BILITOT 0.6 01/23/2013 0017   GFRNONAA 81* 01/23/2013 0547   GFRAA >90 01/23/2013 0547   Assessment:  1.  Diverticular bleeding, positive tagged RBC study in region of splenic flexure.  No further bleeding. 2.  Acute blood loss anemia, source as per above.  Plan:  1.  Would hold off on warfarin for at least the next couple weeks; could consider restarting as outpatient, but if has another episode of bleeding on warfarin, would need to strongly weigh whether the benefits of warfarin (stroke prevention in atrial fibrillation) outweigh the bleeding risks. 2.  Advance diet. 3.  If  tolerates diet and no further bleeding, could consider discharge home tomorrow from GI perspective. 4.  [facility] to follow.   Freddy Jaksch 01/25/2013, 11:13 AM

## 2013-01-26 LAB — TYPE AND SCREEN
ABO/RH(D): A POS
Antibody Screen: POSITIVE
DAT, IgG: NEGATIVE
Donor AG Type: NEGATIVE
PT AG Type: NEGATIVE
Unit division: 0

## 2013-01-26 LAB — CBC
HCT: 26.3 % — ABNORMAL LOW (ref 36.0–46.0)
Hemoglobin: 9.2 g/dL — ABNORMAL LOW (ref 12.0–15.0)
MCH: 30.3 pg (ref 26.0–34.0)
MCV: 86.5 fL (ref 78.0–100.0)
RBC: 3.04 MIL/uL — ABNORMAL LOW (ref 3.87–5.11)
WBC: 7 10*3/uL (ref 4.0–10.5)

## 2013-01-26 LAB — GLUCOSE, CAPILLARY
Glucose-Capillary: 108 mg/dL — ABNORMAL HIGH (ref 70–99)
Glucose-Capillary: 122 mg/dL — ABNORMAL HIGH (ref 70–99)
Glucose-Capillary: 93 mg/dL (ref 70–99)
Glucose-Capillary: 93 mg/dL (ref 70–99)

## 2013-01-26 NOTE — Progress Notes (Signed)
Subjective: Couple more episodes of bleeding last night.  Objective: Vital signs in last 24 hours: Temp:  [98 F (36.7 C)-98.7 F (37.1 C)] 98.2 F (36.8 C) (10/23 0915) Pulse Rate:  [70-93] 72 (10/23 0915) Resp:  [14-18] 14 (10/23 0915) BP: (109-178)/(55-95) 126/82 mmHg (10/23 0915) SpO2:  [100 %] 100 % (10/23 0915) Weight:  [107.865 kg (237 lb 12.8 oz)] 107.865 kg (237 lb 12.8 oz) (10/23 0558) Weight change: 1.134 kg (2 lb 8 oz) Last BM Date: 01/25/13  PE: GEN:  NAD   Lab Results: CBC    Component Value Date/Time   WBC 7.0 01/26/2013 0500   RBC 3.04* 01/26/2013 0500   HGB 9.2* 01/26/2013 0500   HCT 26.3* 01/26/2013 0500   PLT 121* 01/26/2013 0500   MCV 86.5 01/26/2013 0500   MCH 30.3 01/26/2013 0500   MCHC 35.0 01/26/2013 0500   RDW 13.9 01/26/2013 0500   LYMPHSABS 3.7 01/23/2013 0017   MONOABS 0.5 01/23/2013 0017   EOSABS 0.2 01/23/2013 0017   BASOSABS 0.0 01/23/2013 0017   Assessment:  1.  Diverticular bleeding.  Hgb stable.  Unclear if bleeding last night is old blood, or slow rebleeding.  Plan:  1.  Would watch again overnight; if more bleeding overnight, would switch to clear liquids and get tagged RBC study. 2.  Will follow.   Robin Snow 01/26/2013, 1:24 PM

## 2013-01-26 NOTE — Progress Notes (Signed)
Pt a/o, no c/o pain, pts diet advanced to heart healthy, pt tolerating well, pt has not had a BM this shift, denies dizziness, will continue to monitor

## 2013-01-26 NOTE — Progress Notes (Signed)
Pt alert and oriented times 4, ambulating in hallway, NAD noted, able to communicate needs, will continue to monitor

## 2013-01-26 NOTE — Progress Notes (Signed)
[facility] PROGRESS NOTE  Assessment/Plan: Lower GI bleed/acute blood loss anemia: - Consulted GI, EGD 10.20.2014: Gastric erosions could potentially lead to patient's melena and anemia, colonoscopy on 10.21.2014 demonstrated signs of active bleeding, no source identified. Patient had NM which was positive for suspected bleeding in transverse colon, but subsequent aortogram was negative. -Hemodynamically stable, drop in hbg from 12-> 10.5 >7.5 -patient with 2 more episodes of BRBPR overnight -unsure if old residual blood as seen on colonoscopy or acute bleeding again -good response to transfusion; Hgb 9.5 today (10/23) -will observe overnight, follow CBC, continue current diet -if further bleeding will get NM scan as recommended by GI  HYPERTENSION, BENIGN: -stable, cont home meds. -will continue monitoring; BP stable today  A.fib: -rate control, was given FFP and vit K to reverse coagulopathy -INR now subtherapeutic -per GI rec's will hold coumadin for 2 weeks  HLD -continue statins  DM -continue SSI  Code Status: Full Code Family Communication: No family in room  Disposition Plan: home in 1-2 days.  Consultants:  Enid Baas  Procedures:  Colonoscopy  See below for xray reports  Antibiotics:  None  HPI/Subjective: Feeling better. Tolerating diet. No acute complaints. 2 more episodes of BRPR overnight. Hgb 9.5  Objective: Filed Vitals:   01/26/13 0200 01/26/13 0558 01/26/13 0915 01/26/13 1335  BP: 113/66 127/58 126/82 121/56  Pulse: 70 84 72 80  Temp: 98.2 F (36.8 C) 98.6 F (37 C) 98.2 F (36.8 C) 97.9 F (36.6 C)  TempSrc: Oral Oral Oral Oral  Resp:  18 14 17   Height:      Weight:  107.865 kg (237 lb 12.8 oz)    SpO2: 100% 100% 100% 100%    Intake/Output Summary (Last 24 hours) at 01/26/13 1446 Last data filed at 01/26/13 0915  Gross per 24 hour  Intake 1572.5 ml  Output      0 ml  Net 1572.5 ml   Filed Weights   01/24/13 0414  01/25/13 0442 01/26/13 0558  Weight: 109.272 kg (240 lb 14.4 oz) 106.731 kg (235 lb 4.8 oz) 107.865 kg (237 lb 12.8 oz)    Exam:  General: Alert, awake, oriented x3, in no acute distress.  HEENT: No bruits, no goiter.  Heart: Regular rate and rhythm, without murmurs, rubs, gallops.  Lungs: Good air movement, clear to auscultation.  Abdomen: Soft, nontender, nondistended, positive bowel sounds.  Neuro: Grossly intact, nonfocal.   Data Reviewed: Basic Metabolic Panel:  Recent Labs Lab 01/23/13 0017 01/23/13 0547  NA 141 140  K 3.8 3.7  CL 105 106  CO2 24 23  GLUCOSE 102* 109*  BUN 17 15  CREATININE 0.95 0.79  CALCIUM 9.4 8.6   Liver Function Tests:  Recent Labs Lab 01/23/13 0017  AST 18  ALT 11  ALKPHOS 86  BILITOT 0.6  PROT 8.2  ALBUMIN 3.9   CBC:  Recent Labs Lab 01/23/13 0017 01/23/13 0243 01/23/13 0547 01/25/13 0530 01/26/13 0500  WBC 6.9 6.4 5.6 5.5 7.0  NEUTROABS 2.4  --   --   --   --   HGB 12.7 11.7* 10.5* 7.5* 9.2*  HCT 35.8* 34.7* 29.9* 21.4* 26.3*  MCV 85.4 85.9 86.9 88.4 86.5  PLT 142* 123* 120* 109* 121*   Cardiac Enzymes:  Recent Labs Lab 01/23/13 0243  TROPONINI <0.30   CBG:  Recent Labs Lab 01/25/13 1958 01/26/13 0019 01/26/13 0348 01/26/13 0806 01/26/13 1152  GLUCAP 112* 108* 106* 127* 93   Studies: Ir Angiogram Visceral  Selective  01/24/2013   CLINICAL DATA:  Gastrointestinal bleeding. Nuclear medicine study demonstrates acute bleeding in the transverse colon.  EXAM: SELECTIVE VISCERAL ARTERIOGRAPHY; IR ULTRASOUND GUIDANCE VASC ACCESS RIGHT  MEDICATIONS AND MEDICAL HISTORY: Versed 1 mg, Fentanyl 25 mcg.  Additional Medications: Cefazolin 1 g.  ANESTHESIA/SEDATION: Moderate sedation time: 22 minutes  CONTRAST:  70 cc Omnipaque 300  FLUOROSCOPY TIME:  4.0  minutes  PROCEDURE: The procedure, risks, benefits, and alternatives were explained to the patient. Questions regarding the procedure were encouraged and answered. The  patient understands and consents to the procedure.  The right groin was prepped with Betadine in a sterile fashion, and a sterile drape was applied covering the operative field. A sterile gown and sterile gloves were used for the procedure.  Under sonographic guidance, a micropuncture needle was inserted into the common femoral artery and removed over a 018 wire. This was up sized to a Bentson. A 5 French sheath was inserted. A sauce Omni catheter was advanced into the aorta. Within the thoracic aorta, it was performed. The SMA was selected and angiography was performed centering over the right upper quadrant and left upper quadrants. The IMA was then selected and imaging was performed. The sauce Omni catheter was retracted into the right external iliac artery and femoral angiography was performed prior to deployment of the Exoseal device.  COMPLICATIONS: None  FINDINGS: The SMA is patent. Injection demonstrates no source of gastrointestinal bleeding. Specifically, there is no evidence of contrast extravasation, pseudoaneurysm, or vascular malformation. The portal vein is patent.  Similarly, injection of the IMA demonstrates patency of the vessel and no source of gastrointestinal bleeding.  IMPRESSION: The SMA and IMA or both injected and there was no visualize source of gastrointestinal bleeding.   Electronically Signed   By: Maryclare Bean M.D.   On: 01/24/2013 17:10   Ir Angiogram Visceral Selective  01/24/2013   CLINICAL DATA:  Gastrointestinal bleeding. Nuclear medicine study demonstrates acute bleeding in the transverse colon.  EXAM: SELECTIVE VISCERAL ARTERIOGRAPHY; IR ULTRASOUND GUIDANCE VASC ACCESS RIGHT  MEDICATIONS AND MEDICAL HISTORY: Versed 1 mg, Fentanyl 25 mcg.  Additional Medications: Cefazolin 1 g.  ANESTHESIA/SEDATION: Moderate sedation time: 22 minutes  CONTRAST:  70 cc Omnipaque 300  FLUOROSCOPY TIME:  4.0  minutes  PROCEDURE: The procedure, risks, benefits, and alternatives were explained to the  patient. Questions regarding the procedure were encouraged and answered. The patient understands and consents to the procedure.  The right groin was prepped with Betadine in a sterile fashion, and a sterile drape was applied covering the operative field. A sterile gown and sterile gloves were used for the procedure.  Under sonographic guidance, a micropuncture needle was inserted into the common femoral artery and removed over a 018 wire. This was up sized to a Bentson. A 5 French sheath was inserted. A sauce Omni catheter was advanced into the aorta. Within the thoracic aorta, it was performed. The SMA was selected and angiography was performed centering over the right upper quadrant and left upper quadrants. The IMA was then selected and imaging was performed. The sauce Omni catheter was retracted into the right external iliac artery and femoral angiography was performed prior to deployment of the Exoseal device.  COMPLICATIONS: None  FINDINGS: The SMA is patent. Injection demonstrates no source of gastrointestinal bleeding. Specifically, there is no evidence of contrast extravasation, pseudoaneurysm, or vascular malformation. The portal vein is patent.  Similarly, injection of the IMA demonstrates patency of the vessel and no source  of gastrointestinal bleeding.  IMPRESSION: The SMA and IMA or both injected and there was no visualize source of gastrointestinal bleeding.   Electronically Signed   By: Maryclare Bean M.D.   On: 01/24/2013 17:10   Ir US Guide Vasc Access Right  01/24/2013   CLINICAL DATA:  Gastrointestinal bleeding. Nuclear medicine study demonstrates acute bleeding in the transverse colon.  EXAM: SELECTIVE VISCERAL ARTERIOGRAPHY; IR ULTRASOUND GUIDANCE VASC ACCESS RIGHT  MEDICATIONS AND MEDICAL HISTORY: Versed 1 mg, Fentanyl 25 mcg.  Additional Medications: Cefazolin 1 g.  ANESTHESIA/SEDATION: Moderate sedation time: 22 minutes  CONTRAST:  70 cc Omnipaque 300  FLUOROSCOPY TIME:  4.0  minutes   PROCEDURE: The procedure, risks, benefits, and alternatives were explained to the patient. Questions regarding the procedure were encouraged and answered. The patient understands and consents to the procedure.  The right groin was prepped with Betadine in a sterile fashion, and a sterile drape was applied covering the operative field. A sterile gown and sterile gloves were used for the procedure.  Under sonographic guidance, a micropuncture needle was inserted into the common femoral artery and removed over a 018 wire. This was up sized to a Bentson. A 5 French sheath was inserted. A sauce Omni catheter was advanced into the aorta. Within the thoracic aorta, it was performed. The SMA was selected and angiography was performed centering over the right upper quadrant and left upper quadrants. The IMA was then selected and imaging was performed. The sauce Omni catheter was retracted into the right external iliac artery and femoral angiography was performed prior to deployment of the Exoseal device.  COMPLICATIONS: None  FINDINGS: The SMA is patent. Injection demonstrates no source of gastrointestinal bleeding. Specifically, there is no evidence of contrast extravasation, pseudoaneurysm, or vascular malformation. The portal vein is patent.  Similarly, injection of the IMA demonstrates patency of the vessel and no source of gastrointestinal bleeding.  IMPRESSION: The SMA and IMA or both injected and there was no visualize source of gastrointestinal bleeding.   Electronically Signed   By: Maryclare Bean M.D.   On: 01/24/2013 17:10    Scheduled Meds: . cloNIDine  0.1 mg Oral BID  . insulin aspart  0-9 Units Subcutaneous Q4H  . lisinopril  20 mg Oral Daily  . oxybutynin  5 mg Oral Daily  . simvastatin  10 mg Oral q1800  . sodium chloride  3 mL Intravenous Q12H   Continuous Infusions: . sodium chloride 1,000 mL (01/24/13 2057)     Maryama Kuriakose  [facility] Pager [phone]. If 8PM-8AM, please contact  night-coverage at [email or web address], password [facility] 01/26/2013, 2:46 PM  LOS: 3 days

## 2013-01-27 DIAGNOSIS — K219 Gastro-esophageal reflux disease without esophagitis: Secondary | ICD-10-CM

## 2013-01-27 LAB — GLUCOSE, CAPILLARY
Glucose-Capillary: 103 mg/dL — ABNORMAL HIGH (ref 70–99)
Glucose-Capillary: 88 mg/dL (ref 70–99)

## 2013-01-27 LAB — CBC
HCT: 28.1 % — ABNORMAL LOW (ref 36.0–46.0)
Hemoglobin: 9.9 g/dL — ABNORMAL LOW (ref 12.0–15.0)
MCHC: 35.2 g/dL (ref 30.0–36.0)
RBC: 3.19 MIL/uL — ABNORMAL LOW (ref 3.87–5.11)

## 2013-01-27 MED ORDER — PANTOPRAZOLE SODIUM 40 MG PO TBEC: 40.0000 mg | DELAYED_RELEASE_TABLET | Freq: Every day | ORAL | Status: DC

## 2013-01-27 MED ORDER — WARFARIN SODIUM 5 MG PO TABS: 5.0000 mg | ORAL_TABLET | Freq: Every day | ORAL | Status: DC

## 2013-01-27 MED ORDER — PANTOPRAZOLE SODIUM 40 MG PO TBEC
40.0000 mg | DELAYED_RELEASE_TABLET | Freq: Every day | ORAL | Status: DC
  Administered 2013-01-27: 15:00:00 40 mg via ORAL
  Filled 2013-01-27: qty 1

## 2013-01-27 NOTE — Discharge Summary (Signed)
Physician Discharge Summary  Robin Snow ZOX:[id number] DOB: [birth year] DOA: 01/23/2013  PCP: Burtis Junes, MD  Admit date: 01/23/2013 Discharge date: 01/27/2013  Time spent: >30 minutes  Recommendations for Outpatient Follow-up:  CBC to follow hemoglobin levels Basic metabolic panel to follow electrolytes and kidney function Reassess needs of anticoagulation regimen in about 2 weeks of holding Coumadin due to recent bleeding.  Discharge Diagnoses:  Lower GI bleed (appears to be secondary to diverticular bleed) HYPERTENSION, BENIGN acute blood loss anemia Erosive gastritis Hyperlipidemia   Discharge Condition: Stable and improved. At discharge no nausea, no vomiting, no abdominal pain, no chest pain or shortness of breath. No further episodes of bright red blood or melena since 01/25/13. At discharge hemoglobin level 9.1  Diet recommendation: Heart healthy diet and low carb diet  Filed Weights   01/25/13 0442 01/26/13 0558 01/27/13 0517  Weight: 106.731 kg (235 lb 4.8 oz) 107.865 kg (237 lb 12.8 oz) 108 kg (238 lb 1.6 oz)    History of present illness:  72 y.o. female who presents to the ED with 2 episodes at home of hematochezia (had 1 more episode here in the ED). Patient had a BM at 2300, was about to flush but noticed that commode was full of dark red blood. She did not appreciate any clots. The patient is on chronic coumadin for A.Fib with an INR in the ED of 2.03. She has been given Vitamin K and FFP has been ordered. Her pulse is 90 and she is actually hypertensive with SBPs between the 170s to 190s. Hospitalist has been asked to admit.   Hospital Course:  Lower GI bleed/acute blood loss anemia:  - Consulted GI, EGD 10.20.2014: Gastric erosions could potentially lead to patient's melena and anemia, colonoscopy on 10.21.2014 demonstrated signs of active bleeding, no source identified. Patient had NM which was positive for suspected bleeding in transverse colon,  but subsequent aortogram was negative.  -Hemodynamically stable at discharge -Patient received 2 units of PRBCs due to symptomatic anemia and drop in her hemoglobin during this admission. -At discharge hemoglobin 9.9 and patient was completely asymptomatic -patient will be discharged on Protonix 40 mg by mouth daily and she will follow in 2-3 weeks with gastroenterology service for further evaluation and treatment.   HYPERTENSION, BENIGN:  -stable, cont home meds.  -Patient advised to follow a low-sodium heart healthy diet  A.fib:  -rate control, was given FFP and vit K to reverse coagulopathy.  -INR now subtherapeutic  -per GI rec's will hold coumadin for 2 weeks  -Close follow by PCP to restart after 2 weeks anticoagulation regimen if her hemoglobin remains stable and no further bleeding episodes are appreciated.  HLD  -continue statins   DM  -Well controlled. -A1c to be checked by PCP and further adjustment on her hypoglycemic regimen to be done as needed -Continue low carb diet  *The rest of her medical problems remains stable and the plan is to continue current home medications regimen; patient will follow with her primary care physician in one week  Procedures:  EGD: demonstrated erosive gastritis  Colonoscopy: positive for blood in colon (fresh blood); positive diverticulosis.  See below for x-ray reports (had positive RBC tagged NM test, but negative aortogram)  Consultations:  GI  Discharge Exam: Filed Vitals:   01/27/13 0932  BP: 141/70  Pulse: 89  Temp:   Resp:    General: Alert, awake, oriented x3, in no acute distress.  HEENT: No bruits, no goiter.  Heart: Regular  rate and rhythm, without murmurs, rubs, gallops.  Lungs: Good air movement, clear to auscultation.  Abdomen: Soft, nontender, nondistended, positive bowel sounds.  Neuro: Grossly intact, nonfocal.   Discharge Instructions  Discharge Orders   Future Orders Complete By Expires   Discharge  instructions  As directed    Comments:     ARRANGE FOLLOW UP WITH PCP IN 1 WEEK TAKE MEDICATIONS AS PRESCRIBED STOP COUMADIN FOR 2 WEEKS FOLLOW WITH GASTROENTEROLOGY SERVICE AS INSTRUCTED FOLLOW A LOW SODIUM HEART HEALTHY DIET       Medication List         cloNIDine 0.1 MG tablet  Commonly known as:  CATAPRES  Take 0.1 mg by mouth 2 (two) times daily.     furosemide 40 MG tablet  Commonly known as:  LASIX  Take 40 mg by mouth daily as needed (swelling).     glimepiride 2 MG tablet  Commonly known as:  AMARYL  Take 2 mg by mouth daily before breakfast.     hydrochlorothiazide 12.5 MG capsule  Commonly known as:  MICROZIDE  Take 12.5 mg by mouth daily.     HYDROcodone-acetaminophen 5-325 MG per tablet  Commonly known as:  NORCO/VICODIN  Take 1 tablet by mouth 2 (two) times daily as needed for pain.     lisinopril 20 MG tablet  Commonly known as:  PRINIVIL,ZESTRIL  Take 20 mg by mouth daily.     lovastatin 20 MG tablet  Commonly known as:  MEVACOR  Take 20 mg by mouth at bedtime.     oxybutynin 5 MG 24 hr tablet  Commonly known as:  DITROPAN-XL  Take 5 mg by mouth daily.     potassium chloride SA 20 MEQ tablet  Commonly known as:  K-DUR,KLOR-CON  Take 20 mEq by mouth daily.     warfarin 5 MG tablet  Commonly known as:  COUMADIN  Take 1 tablet (5 mg total) by mouth daily. STOP COUMADIN FOR 2 WEEKS       Allergies  Allergen Reactions  . Aspirin Hives  . Hydrocodone Hives  . Rofecoxib Hives       Follow-up Information   Follow up with Burtis Junes, MD. Schedule an appointment as soon as possible for a visit in 1 week.   Specialty:  General Surgery   Contact information:   [address] [place] [address] [phone]       Follow up with Freddy Jaksch, MD. Schedule an appointment as soon as possible for a visit in 2 weeks.   Specialty:  Gastroenterology   Contact information:   [address], [address] [place] [address] [phone]       The results of significant diagnostics from this hospitalization (including imaging, microbiology, ancillary and laboratory) are listed below for reference.    Significant Diagnostic Studies: Nm Gi Blood Loss  01/24/2013   CLINICAL DATA:  Rectal bleeding.  EXAM: NUCLEAR MEDICINE GASTROINTESTINAL BLEEDING SCAN  TECHNIQUE: Sequential abdominal images were obtained following intravenous administration of Tc-19m labeled red blood cells.  COMPARISON:  CT 12/03/2011  RADIOPHARMACEUTICALS:  CURIE ULTRATAG TECHNETIUM TC 25M-LABELED RED BLOOD CELLS IV KITmCi Tc-71m in-vitro labeled red cells.  FINDINGS: Periodic activities noted in the region of the stomach passing into the proximal duodenum, likely related to free technetium.  There is also abnormal activity noted corresponding to the transverse colon on prior CT compatible with active GI bleeding.  IMPRESSION: Active GI bleed noted in the region of  the transverse colon.  Critical Value/emergent results were called by telephone at the time of interpretation on 01/24/2013 at 2:57 PM to Dr.WILLIAM OUTLAW , who verbally acknowledged these results.   Electronically Signed   By: Charlett Nose M.D.   On: 01/24/2013 15:08   Ir Angiogram Visceral Selective  01/24/2013   CLINICAL DATA:  Gastrointestinal bleeding. Nuclear medicine study demonstrates acute bleeding in the transverse colon.  EXAM: SELECTIVE VISCERAL ARTERIOGRAPHY; IR ULTRASOUND GUIDANCE VASC ACCESS RIGHT  MEDICATIONS AND MEDICAL HISTORY: Versed 1 mg, Fentanyl 25 mcg.  Additional Medications: Cefazolin 1 g.  ANESTHESIA/SEDATION: Moderate sedation time: 22 minutes  CONTRAST:  70 cc Omnipaque 300  FLUOROSCOPY TIME:  4.0  minutes  PROCEDURE: The procedure, risks, benefits, and alternatives were explained to the patient. Questions regarding the procedure were encouraged and answered. The patient understands and consents to the procedure.  The right groin was  prepped with Betadine in a sterile fashion, and a sterile drape was applied covering the operative field. A sterile gown and sterile gloves were used for the procedure.  Under sonographic guidance, a micropuncture needle was inserted into the common femoral artery and removed over a 018 wire. This was up sized to a Bentson. A 5 French sheath was inserted. A sauce Omni catheter was advanced into the aorta. Within the thoracic aorta, it was performed. The SMA was selected and angiography was performed centering over the right upper quadrant and left upper quadrants. The IMA was then selected and imaging was performed. The sauce Omni catheter was retracted into the right external iliac artery and femoral angiography was performed prior to deployment of the Exoseal device.  COMPLICATIONS: None  FINDINGS: The SMA is patent. Injection demonstrates no source of gastrointestinal bleeding. Specifically, there is no evidence of contrast extravasation, pseudoaneurysm, or vascular malformation. The portal vein is patent.  Similarly, injection of the IMA demonstrates patency of the vessel and no source of gastrointestinal bleeding.  IMPRESSION: The SMA and IMA or both injected and there was no visualize source of gastrointestinal bleeding.   Electronically Signed   By: Maryclare Bean M.D.   On: 01/24/2013 17:10   Ir Angiogram Visceral Selective  01/24/2013   CLINICAL DATA:  Gastrointestinal bleeding. Nuclear medicine study demonstrates acute bleeding in the transverse colon.  EXAM: SELECTIVE VISCERAL ARTERIOGRAPHY; IR ULTRASOUND GUIDANCE VASC ACCESS RIGHT  MEDICATIONS AND MEDICAL HISTORY: Versed 1 mg, Fentanyl 25 mcg.  Additional Medications: Cefazolin 1 g.  ANESTHESIA/SEDATION: Moderate sedation time: 22 minutes  CONTRAST:  70 cc Omnipaque 300  FLUOROSCOPY TIME:  4.0  minutes  PROCEDURE: The procedure, risks, benefits, and alternatives were explained to the patient. Questions regarding the procedure were encouraged and answered.  The patient understands and consents to the procedure.  The right groin was prepped with Betadine in a sterile fashion, and a sterile drape was applied covering the operative field. A sterile gown and sterile gloves were used for the procedure.  Under sonographic guidance, a micropuncture needle was inserted into the common femoral artery and removed over a 018 wire. This was up sized to a Bentson. A 5 French sheath was inserted. A sauce Omni catheter was advanced into the aorta. Within the thoracic aorta, it was performed. The SMA was selected and angiography was performed centering over the right upper quadrant and left upper quadrants. The IMA was then selected and imaging was performed. The sauce Omni catheter was retracted into the right external iliac artery and femoral angiography was performed prior to deployment  of the Exoseal device.  COMPLICATIONS: None  FINDINGS: The SMA is patent. Injection demonstrates no source of gastrointestinal bleeding. Specifically, there is no evidence of contrast extravasation, pseudoaneurysm, or vascular malformation. The portal vein is patent.  Similarly, injection of the IMA demonstrates patency of the vessel and no source of gastrointestinal bleeding.  IMPRESSION: The SMA and IMA or both injected and there was no visualize source of gastrointestinal bleeding.   Electronically Signed   By: Maryclare Bean M.D.   On: 01/24/2013 17:10   Ir US Guide Vasc Access Right  01/24/2013   CLINICAL DATA:  Gastrointestinal bleeding. Nuclear medicine study demonstrates acute bleeding in the transverse colon.  EXAM: SELECTIVE VISCERAL ARTERIOGRAPHY; IR ULTRASOUND GUIDANCE VASC ACCESS RIGHT  MEDICATIONS AND MEDICAL HISTORY: Versed 1 mg, Fentanyl 25 mcg.  Additional Medications: Cefazolin 1 g.  ANESTHESIA/SEDATION: Moderate sedation time: 22 minutes  CONTRAST:  70 cc Omnipaque 300  FLUOROSCOPY TIME:  4.0  minutes  PROCEDURE: The procedure, risks, benefits, and alternatives were explained to  the patient. Questions regarding the procedure were encouraged and answered. The patient understands and consents to the procedure.  The right groin was prepped with Betadine in a sterile fashion, and a sterile drape was applied covering the operative field. A sterile gown and sterile gloves were used for the procedure.  Under sonographic guidance, a micropuncture needle was inserted into the common femoral artery and removed over a 018 wire. This was up sized to a Bentson. A 5 French sheath was inserted. A sauce Omni catheter was advanced into the aorta. Within the thoracic aorta, it was performed. The SMA was selected and angiography was performed centering over the right upper quadrant and left upper quadrants. The IMA was then selected and imaging was performed. The sauce Omni catheter was retracted into the right external iliac artery and femoral angiography was performed prior to deployment of the Exoseal device.  COMPLICATIONS: None  FINDINGS: The SMA is patent. Injection demonstrates no source of gastrointestinal bleeding. Specifically, there is no evidence of contrast extravasation, pseudoaneurysm, or vascular malformation. The portal vein is patent.  Similarly, injection of the IMA demonstrates patency of the vessel and no source of gastrointestinal bleeding.  IMPRESSION: The SMA and IMA or both injected and there was no visualize source of gastrointestinal bleeding.   Electronically Signed   By: Maryclare Bean M.D.   On: 01/24/2013 17:10   Mm Digital Screening  01/19/2013   CLINICAL DATA:  Screening.  EXAM: DIGITAL SCREENING BILATERAL MAMMOGRAM WITH CAD  COMPARISON:  Previous exam(s).  ACR Breast Density Category b: There are scattered areas of fibroglandular density.  FINDINGS: There are no findings suspicious for malignancy. Images were processed with CAD.  IMPRESSION: No mammographic evidence of malignancy. A result letter of this screening mammogram will be mailed directly to the patient.   RECOMMENDATION: Screening mammogram in one year. (Code:SM-B-01Y)  BI-RADS CATEGORY  1: Negative   Electronically Signed   By: Elberta Fortis M.D.   On: 01/19/2013 13:02   Labs: Basic Metabolic Panel:  Recent Labs Lab 01/23/13 0017 01/23/13 0547  NA 141 140  K 3.8 3.7  CL 105 106  CO2 24 23  GLUCOSE 102* 109*  BUN 17 15  CREATININE 0.95 0.79  CALCIUM 9.4 8.6   Liver Function Tests:  Recent Labs Lab 01/23/13 0017  AST 18  ALT 11  ALKPHOS 86  BILITOT 0.6  PROT 8.2  ALBUMIN 3.9   CBC:  Recent Labs Lab 01/23/13 0017  01/23/13 0243 01/23/13 0547 01/25/13 0530 01/26/13 0500 01/27/13 1034  WBC 6.9 6.4 5.6 5.5 7.0 6.6  NEUTROABS 2.4  --   --   --   --   --   HGB 12.7 11.7* 10.5* 7.5* 9.2* 9.9*  HCT 35.8* 34.7* 29.9* 21.4* 26.3* 28.1*  MCV 85.4 85.9 86.9 88.4 86.5 88.1  PLT 142* 123* 120* 109* 121* 130*   Cardiac Enzymes:  Recent Labs Lab 01/23/13 0243  TROPONINI <0.30   CBG:  Recent Labs Lab 01/26/13 2006 01/27/13 0014 01/27/13 0432 01/27/13 0744 01/27/13 1202  GLUCAP 122* 103* 87 88 95    Signed:  Niasha Devins  [facility] 01/27/2013, 1:27 PM

## 2013-01-27 NOTE — Progress Notes (Signed)
Pt a/o, no c/o of pain, pt has not had any s/s of bleeding Hgb 9.9, pt being dc to home with family, pt given dx instructions, medications and follow up appointments reviewed, pt verbalized understanding, pt left with family via wheelchair, pt stable

## 2013-01-27 NOTE — Progress Notes (Signed)
Subjective: No bleeding in > 36 hours. No abdominal pain. Tolerating diet.  Objective: Vital signs in last 24 hours: Temp:  [97.9 F (36.6 C)-98.3 F (36.8 C)] 98.3 F (36.8 C) (10/24 0517) Pulse Rate:  [59-90] 89 (10/24 0932) Resp:  [17-20] 18 (10/24 0517) BP: (103-152)/(50-80) 141/70 mmHg (10/24 0932) SpO2:  [100 %] 100 % (10/24 0517) Weight:  [108 kg (238 lb 1.6 oz)] 108 kg (238 lb 1.6 oz) (10/24 0517) Weight change: 0.135 kg (4.8 oz) Last BM Date: 01/25/13  PE: GEN:  NAD  Lab Results: CBC    Component Value Date/Time   WBC 7.0 01/26/2013 0500   RBC 3.04* 01/26/2013 0500   HGB 9.2* 01/26/2013 0500   HCT 26.3* 01/26/2013 0500   PLT 121* 01/26/2013 0500   MCV 86.5 01/26/2013 0500   MCH 30.3 01/26/2013 0500   MCHC 35.0 01/26/2013 0500   RDW 13.9 01/26/2013 0500   LYMPHSABS 3.7 01/23/2013 0017   MONOABS 0.5 01/23/2013 0017   EOSABS 0.2 01/23/2013 0017   BASOSABS 0.0 01/23/2013 0017   Assessment:  1.  Diverticular bleeding, in setting of anticoagulation, resolved.  Clinically well from GI standpoint at this time. 2.  Acute blood loss anemia.  Plan:  1.  OK to discharge today from GI standpoint. 2.  Would stay off warfarin for at least the next couple weeks. 3.  Patient can follow-up with Dr. Dulce Sellar of [facility] [phone]) in the next 3-4 weeks; she needs to call to make this appointment. 4.  Will sign-off; please call with questions; thank you for the consult.   Robin Snow 01/27/2013, 11:16 AM

## 2013-02-23 ENCOUNTER — Encounter (INDEPENDENT_AMBULATORY_CARE_PROVIDER_SITE_OTHER): Payer: Medicare Other | Admitting: Ophthalmology

## 2013-02-23 DIAGNOSIS — E1139 Type 2 diabetes mellitus with other diabetic ophthalmic complication: Secondary | ICD-10-CM

## 2013-02-23 DIAGNOSIS — E11319 Type 2 diabetes mellitus with unspecified diabetic retinopathy without macular edema: Secondary | ICD-10-CM

## 2013-02-23 DIAGNOSIS — H35039 Hypertensive retinopathy, unspecified eye: Secondary | ICD-10-CM

## 2013-02-23 DIAGNOSIS — H43819 Vitreous degeneration, unspecified eye: Secondary | ICD-10-CM

## 2013-02-23 DIAGNOSIS — I10 Essential (primary) hypertension: Secondary | ICD-10-CM

## 2013-04-06 DIAGNOSIS — I639 Cerebral infarction, unspecified: Secondary | ICD-10-CM

## 2013-04-06 DIAGNOSIS — K922 Gastrointestinal hemorrhage, unspecified: Secondary | ICD-10-CM

## 2013-04-06 HISTORY — DX: Cerebral infarction, unspecified: I63.9

## 2013-04-06 HISTORY — DX: Gastrointestinal hemorrhage, unspecified: K92.2

## 2013-04-26 ENCOUNTER — Other Ambulatory Visit: Payer: Self-pay | Admitting: Family Medicine

## 2013-04-27 ENCOUNTER — Other Ambulatory Visit (HOSPITAL_COMMUNITY): Payer: Self-pay | Admitting: Family Medicine

## 2013-05-29 ENCOUNTER — Encounter (HOSPITAL_COMMUNITY): Payer: Self-pay | Admitting: Emergency Medicine

## 2013-05-29 DIAGNOSIS — E119 Type 2 diabetes mellitus without complications: Secondary | ICD-10-CM | POA: Diagnosis present

## 2013-05-29 DIAGNOSIS — D689 Coagulation defect, unspecified: Secondary | ICD-10-CM | POA: Diagnosis present

## 2013-05-29 DIAGNOSIS — Z7982 Long term (current) use of aspirin: Secondary | ICD-10-CM

## 2013-05-29 DIAGNOSIS — I10 Essential (primary) hypertension: Secondary | ICD-10-CM | POA: Diagnosis present

## 2013-05-29 DIAGNOSIS — K921 Melena: Principal | ICD-10-CM | POA: Diagnosis present

## 2013-05-29 DIAGNOSIS — I4891 Unspecified atrial fibrillation: Secondary | ICD-10-CM | POA: Diagnosis present

## 2013-05-29 DIAGNOSIS — Z79899 Other long term (current) drug therapy: Secondary | ICD-10-CM

## 2013-05-29 DIAGNOSIS — Z96659 Presence of unspecified artificial knee joint: Secondary | ICD-10-CM

## 2013-05-29 DIAGNOSIS — M129 Arthropathy, unspecified: Secondary | ICD-10-CM | POA: Diagnosis present

## 2013-05-29 NOTE — ED Notes (Signed)
Patient presents stating she has had 2 BM's this evening that are maroon in color

## 2013-05-30 ENCOUNTER — Encounter (HOSPITAL_COMMUNITY): Payer: Self-pay

## 2013-05-30 ENCOUNTER — Inpatient Hospital Stay (HOSPITAL_COMMUNITY)
Admission: EM | Admit: 2013-05-30 | Discharge: 2013-06-03 | DRG: 378 | Disposition: A | Discharge status: Home / Self Care | Payer: Medicare Other | Attending: Internal Medicine | Admitting: Internal Medicine

## 2013-05-30 DIAGNOSIS — I4891 Unspecified atrial fibrillation: Secondary | ICD-10-CM

## 2013-05-30 DIAGNOSIS — I4821 Permanent atrial fibrillation: Secondary | ICD-10-CM | POA: Diagnosis present

## 2013-05-30 DIAGNOSIS — R079 Chest pain, unspecified: Secondary | ICD-10-CM

## 2013-05-30 DIAGNOSIS — K922 Gastrointestinal hemorrhage, unspecified: Secondary | ICD-10-CM | POA: Diagnosis present

## 2013-05-30 DIAGNOSIS — I10 Essential (primary) hypertension: Secondary | ICD-10-CM | POA: Diagnosis present

## 2013-05-30 DIAGNOSIS — R072 Precordial pain: Secondary | ICD-10-CM

## 2013-05-30 LAB — COMPREHENSIVE METABOLIC PANEL
ALBUMIN: 3.1 g/dL — AB (ref 3.5–5.2)
ALK PHOS: 82 U/L (ref 39–117)
ALT: 10 U/L (ref 0–35)
ALT: 10 U/L (ref 0–35)
AST: 12 U/L (ref 0–37)
AST: 13 U/L (ref 0–37)
Albumin: 3.4 g/dL — ABNORMAL LOW (ref 3.5–5.2)
Alkaline Phosphatase: 73 U/L (ref 39–117)
BUN: 15 mg/dL (ref 6–23)
BUN: 17 mg/dL (ref 6–23)
CALCIUM: 9 mg/dL (ref 8.4–10.5)
CO2: 28 mEq/L (ref 19–32)
CO2: 28 mEq/L (ref 19–32)
CREATININE: 1.04 mg/dL (ref 0.50–1.10)
Calcium: 9.4 mg/dL (ref 8.4–10.5)
Chloride: 103 mEq/L (ref 96–112)
Chloride: 107 mEq/L (ref 96–112)
Creatinine, Ser: 1.28 mg/dL — ABNORMAL HIGH (ref 0.50–1.10)
GFR calc Af Amer: 47 mL/min — ABNORMAL LOW (ref >90)
GFR calc Af Amer: 60 mL/min — ABNORMAL LOW (ref >90)
GFR calc non Af Amer: 40 mL/min — ABNORMAL LOW (ref >90)
GFR calc non Af Amer: 52 mL/min — ABNORMAL LOW (ref >90)
Glucose, Bld: 116 mg/dL — ABNORMAL HIGH (ref 70–99)
Glucose, Bld: 97 mg/dL (ref 70–99)
POTASSIUM: 4.3 meq/L (ref 3.7–5.3)
Potassium: 4.2 mEq/L (ref 3.7–5.3)
SODIUM: 143 meq/L (ref 137–147)
Sodium: 146 mEq/L (ref 137–147)
TOTAL PROTEIN: 6.7 g/dL (ref 6.0–8.3)
TOTAL PROTEIN: 7.4 g/dL (ref 6.0–8.3)
Total Bilirubin: 0.4 mg/dL (ref 0.3–1.2)
Total Bilirubin: 0.4 mg/dL (ref 0.3–1.2)

## 2013-05-30 LAB — GLUCOSE, CAPILLARY
Glucose-Capillary: 105 mg/dL — ABNORMAL HIGH (ref 70–99)
Glucose-Capillary: 91 mg/dL (ref 70–99)
Glucose-Capillary: 97 mg/dL (ref 70–99)
Glucose-Capillary: 100 mg/dL — ABNORMAL HIGH (ref 70–99)
Glucose-Capillary: 148 mg/dL — ABNORMAL HIGH (ref 70–99)

## 2013-05-30 LAB — CBC WITH DIFFERENTIAL/PLATELET
BASOS ABS: 0 10*3/uL (ref 0.0–0.1)
BASOS PCT: 0 % (ref 0–1)
Eosinophils Absolute: 0.2 10*3/uL (ref 0.0–0.7)
Eosinophils Relative: 3 % (ref 0–5)
HCT: 35.8 % — ABNORMAL LOW (ref 36.0–46.0)
Hemoglobin: 12 g/dL (ref 12.0–15.0)
Lymphocytes Relative: 36 % (ref 12–46)
Lymphs Abs: 2.3 10*3/uL (ref 0.7–4.0)
MCH: 28.6 pg (ref 26.0–34.0)
MCHC: 33.5 g/dL (ref 30.0–36.0)
MCV: 85.4 fL (ref 78.0–100.0)
Monocytes Absolute: 0.6 10*3/uL (ref 0.1–1.0)
Monocytes Relative: 9 % (ref 3–12)
NEUTROS PCT: 52 % (ref 43–77)
Neutro Abs: 3.4 10*3/uL (ref 1.7–7.7)
PLATELETS: 173 10*3/uL (ref 150–400)
RBC: 4.19 MIL/uL (ref 3.87–5.11)
RDW: 14.9 % (ref 11.5–15.5)
WBC: 6.6 10*3/uL (ref 4.0–10.5)

## 2013-05-30 LAB — CBC
HCT: 30.2 % — ABNORMAL LOW (ref 36.0–46.0)
HEMATOCRIT: 32.1 % — AB (ref 36.0–46.0)
HEMATOCRIT: 31.2 % — AB (ref 36.0–46.0)
HEMOGLOBIN: 10.3 g/dL — AB (ref 12.0–15.0)
Hemoglobin: 10.9 g/dL — ABNORMAL LOW (ref 12.0–15.0)
Hemoglobin: 10.4 g/dL — ABNORMAL LOW (ref 12.0–15.0)
MCH: 29.2 pg (ref 26.0–34.0)
MCH: 29.1 pg (ref 26.0–34.0)
MCH: 28.7 pg (ref 26.0–34.0)
MCHC: 34 g/dL (ref 30.0–36.0)
MCHC: 34.1 g/dL (ref 30.0–36.0)
MCHC: 33.3 g/dL (ref 30.0–36.0)
MCV: 86.1 fL (ref 78.0–100.0)
MCV: 85.3 fL (ref 78.0–100.0)
MCV: 86 fL (ref 78.0–100.0)
Platelets: 151 10*3/uL (ref 150–400)
Platelets: 154 10*3/uL (ref 150–400)
Platelets: 149 10*3/uL — ABNORMAL LOW (ref 150–400)
RBC: 3.73 MIL/uL — ABNORMAL LOW (ref 3.87–5.11)
RBC: 3.54 MIL/uL — AB (ref 3.87–5.11)
RBC: 3.63 MIL/uL — ABNORMAL LOW (ref 3.87–5.11)
RDW: 15 % (ref 11.5–15.5)
RDW: 14.9 % (ref 11.5–15.5)
RDW: 15.1 % (ref 11.5–15.5)
WBC: 6 10*3/uL (ref 4.0–10.5)
WBC: 5.7 10*3/uL (ref 4.0–10.5)
WBC: 5.4 10*3/uL (ref 4.0–10.5)

## 2013-05-30 LAB — TYPE AND SCREEN
ABO/RH(D): A POS
Antibody Screen: POSITIVE
DAT, IgG: NEGATIVE
PT AG Type: POSITIVE

## 2013-05-30 LAB — POC OCCULT BLOOD, ED: FECAL OCCULT BLD: POSITIVE — AB

## 2013-05-30 LAB — PROTIME-INR
INR: 1.02 (ref 0.00–1.49)
Prothrombin Time: 13.2 seconds (ref 11.6–15.2)

## 2013-05-30 LAB — LACTIC ACID, PLASMA: LACTIC ACID, VENOUS: 1.8 mmol/L (ref 0.5–2.2)

## 2013-05-30 LAB — APTT: aPTT: 32 seconds (ref 24–37)

## 2013-05-30 MED ORDER — ACETAMINOPHEN 650 MG RE SUPP: 650.0000 mg | Freq: Four times a day (QID) | RECTAL | Status: DC | PRN

## 2013-05-30 MED ORDER — PANTOPRAZOLE SODIUM 40 MG IV SOLR: 40.0000 mg | Freq: Two times a day (BID) | INTRAVENOUS | Status: DC

## 2013-05-30 MED ORDER — SODIUM CHLORIDE 0.9 % IV SOLN
80.0000 mg | Freq: Once | INTRAVENOUS | Status: AC
  Administered 2013-05-30: 80 mg via INTRAVENOUS
  Filled 2013-05-30: qty 80

## 2013-05-30 MED ORDER — SODIUM CHLORIDE 0.9 % IV SOLN
8.0000 mg/h | INTRAVENOUS | Status: DC
  Administered 2013-05-30 – 2013-06-01 (×3): 8 mg/h via INTRAVENOUS
  Filled 2013-05-30 (×12): qty 80

## 2013-05-30 MED ORDER — OXYBUTYNIN CHLORIDE ER 5 MG PO TB24
5.0000 mg | ORAL_TABLET | Freq: Every day | ORAL | Status: DC
  Administered 2013-05-30 – 2013-06-03 (×5): 5 mg via ORAL
  Filled 2013-05-30 (×5): qty 1

## 2013-05-30 MED ORDER — PANTOPRAZOLE SODIUM 40 MG IV SOLR
40.0000 mg | INTRAVENOUS | Status: AC
  Administered 2013-05-30: 40 mg via INTRAVENOUS
  Filled 2013-05-30: qty 40

## 2013-05-30 MED ORDER — ONDANSETRON HCL 4 MG/2ML IJ SOLN: 4.0000 mg | Freq: Three times a day (TID) | INTRAMUSCULAR | Status: DC | PRN

## 2013-05-30 MED ORDER — HYDRALAZINE HCL 25 MG PO TABS
25.0000 mg | ORAL_TABLET | Freq: Four times a day (QID) | ORAL | Status: DC
  Administered 2013-05-30 – 2013-05-31 (×2): 25 mg via ORAL
  Filled 2013-05-30 (×12): qty 1

## 2013-05-30 MED ORDER — CLONIDINE HCL 0.1 MG/24HR TD PTWK
0.1000 mg | MEDICATED_PATCH | TRANSDERMAL | Status: DC
  Administered 2013-05-30: 0.1 mg via TRANSDERMAL

## 2013-05-30 MED ORDER — SIMVASTATIN 10 MG PO TABS
10.0000 mg | ORAL_TABLET | Freq: Every day | ORAL | Status: DC
  Administered 2013-05-30 – 2013-06-02 (×4): 10 mg via ORAL
  Filled 2013-05-30 (×5): qty 1

## 2013-05-30 MED ORDER — CLONIDINE HCL 0.1 MG/24HR TD PTWK
0.1000 mg | MEDICATED_PATCH | TRANSDERMAL | Status: DC
  Filled 2013-05-30: qty 1

## 2013-05-30 MED ORDER — SODIUM CHLORIDE 0.9 % IV SOLN
INTRAVENOUS | Status: AC
  Administered 2013-05-30 – 2013-05-31 (×2): via INTRAVENOUS

## 2013-05-30 MED ORDER — ACETAMINOPHEN 325 MG PO TABS
650.0000 mg | ORAL_TABLET | Freq: Four times a day (QID) | ORAL | Status: DC | PRN
  Administered 2013-05-31 – 2013-06-03 (×2): 650 mg via ORAL
  Filled 2013-05-30 (×2): qty 2

## 2013-05-30 MED ORDER — ONDANSETRON HCL 4 MG PO TABS: 4.0000 mg | ORAL_TABLET | Freq: Four times a day (QID) | ORAL | Status: DC | PRN

## 2013-05-30 MED ORDER — POLYETHYLENE GLYCOL 3350 17 G PO PACK
17.0000 g | PACK | Freq: Three times a day (TID) | ORAL | Status: DC
  Administered 2013-05-30 – 2013-06-02 (×11): 17 g via ORAL
  Filled 2013-05-30 (×15): qty 1

## 2013-05-30 MED ORDER — LISINOPRIL 20 MG PO TABS
20.0000 mg | ORAL_TABLET | Freq: Every day | ORAL | Status: DC
  Administered 2013-05-30 – 2013-06-03 (×5): 20 mg via ORAL
  Filled 2013-05-30 (×5): qty 1

## 2013-05-30 MED ORDER — ONDANSETRON HCL 4 MG/2ML IJ SOLN: 4.0000 mg | Freq: Four times a day (QID) | INTRAMUSCULAR | Status: DC | PRN

## 2013-05-30 MED ORDER — FAMOTIDINE IN NACL 20-0.9 MG/50ML-% IV SOLN
20.0000 mg | INTRAVENOUS | Status: AC
  Administered 2013-05-30: 20 mg via INTRAVENOUS
  Filled 2013-05-30: qty 50

## 2013-05-30 NOTE — H&P (Signed)
[facility] History and Physical  Robin Snow WUJ:[id number] DOB: 01-Jul-1940 DOA: 05/30/2013  Referring physician: ER physician. PCP: Burtis Junes, MD   Chief Complaint: Rectal bleeding.  HPI: Robin Snow is a 73 y.o. female with history of diabetes mellitus, hypertension, atrial fibrillation who was admitted last October with GI bleed and had colonoscopy and EGD presents with complaints of rectal bleeding. Patient has had 2 episodes of rectal bleeding last evening. Patient states initially it was dark black color stools which was loose subsequent to which patient past stools which had blood clots. Patient also experienced some nonspecific abdominal discomfort. Denies any nausea vomiting. Patient takes aspirin for atrial fibrillation since patient's Coumadin was discontinued from last bleed. Patient also had taken NSAIDs last month for abdominal discomfort. Patient in the ER was found to be hemodynamically stable and hemoglobin is around 12 is being admitted for further management. Patient denies any chest pain or shortness of breath. During last admission patient had colonoscopy which showed diverticular bleed and had a nuclear scan which showed bleeding and following aortogram did not show any active bleed.   Review of Systems: As presented in the history of presenting illness, rest negative.  Past Medical History  Diagnosis Date  . Diabetes mellitus   . Atrial fibrillation   . Hypertension   . Arthritis   . Coagulopathy    Past Surgical History  Procedure Laterality Date  . Cesarean section    . Replacement total knee bilateral    . Tubal ligation    . Esophagogastroduodenoscopy Left 01/23/2013    Procedure: ESOPHAGOGASTRODUODENOSCOPY (EGD);  Surgeon: Willis Modena, MD;  Location: [facility] ENDOSCOPY;  Service: Endoscopy;  Laterality: Left;  . Colonoscopy Left 01/24/2013    Procedure: COLONOSCOPY;  Surgeon: Willis Modena, MD;  Location: [facility] ENDOSCOPY;  Service:  Endoscopy;  Laterality: Left;   Social History:  reports that she has never smoked. She has never used smokeless tobacco. She reports that she drinks alcohol. She reports that she does not use illicit drugs. Where does patient live home. Can patient participate in ADLs? Yes.  Allergies  Allergen Reactions  . Aspirin Hives  . Hydrocodone Hives  . Rofecoxib Hives    Family History: History reviewed. No pertinent family history.    Prior to Admission medications   Medication Sig Start Date End Date Taking? Authorizing Provider  acetaminophen (TYLENOL) 500 MG tablet Take 500 mg by mouth every 6 (six) hours as needed for mild pain.   Yes Historical Provider, MD  aspirin EC 81 MG tablet Take 81 mg by mouth daily.   Yes Historical Provider, MD  cloNIDine (CATAPRES) 0.1 MG tablet Take 0.1 mg by mouth 2 (two) times daily.    Yes Historical Provider, MD  furosemide (LASIX) 40 MG tablet Take 40 mg by mouth daily as needed (swelling).   Yes Historical Provider, MD  glimepiride (AMARYL) 2 MG tablet Take 2 mg by mouth daily before breakfast.   Yes Historical Provider, MD  hydrochlorothiazide (MICROZIDE) 12.5 MG capsule Take 12.5 mg by mouth daily.   Yes Historical Provider, MD  lisinopril (PRINIVIL,ZESTRIL) 20 MG tablet Take 20 mg by mouth daily.   Yes Historical Provider, MD  lovastatin (MEVACOR) 20 MG tablet Take 20 mg by mouth at bedtime.   Yes Historical Provider, MD  oxybutynin (DITROPAN-XL) 5 MG 24 hr tablet Take 5 mg by mouth daily.   Yes Historical Provider, MD  pantoprazole (PROTONIX) 40 MG tablet Take 1 tablet (40 mg total) by mouth  daily at 12 noon. 01/27/13  Yes Vassie Loll, MD  potassium chloride SA (K-DUR,KLOR-CON) 20 MEQ tablet Take 20 mEq by mouth daily.    Yes Historical Provider, MD    Physical Exam: Filed Vitals:   05/30/13 0215 05/30/13 0315 05/30/13 0330 05/30/13 0408  BP: 128/77 119/58 126/74 151/80  Pulse: 98 83 69 75  Temp:    98 F (36.7 C)  TempSrc:    Oral  Resp:     18  Height:    5\' 7"  (1.702 m)  Weight:    111.131 kg (245 lb)  SpO2: 99% 98% 99% 99%     General:  Well-developed well-nourished.  Eyes: Anicteric no pallor.  ENT: No discharge from the ears eyes nose mouth.  Neck: No mass felt.  Cardiovascular: S1-S2 heard.  Respiratory: No rhonchi or crepitations.  Abdomen:  Soft nontender bowel sounds present. No guarding or rigidity.  Skin: No rash.  Musculoskeletal: No edema.  Psychiatric: Appears normal.  Neurologic: Alert awake oriented to time place and person. Moves all extremities.  Labs on Admission:  Basic Metabolic Panel:  Recent Labs Lab 05/29/13 2351  NA 143  K 4.3  CL 103  CO2 28  GLUCOSE 116*  BUN 15  CREATININE 1.28*  CALCIUM 9.4   Liver Function Tests:  Recent Labs Lab 05/29/13 2351  AST 13  ALT 10  ALKPHOS 82  BILITOT 0.4  PROT 7.4  ALBUMIN 3.4*   No results found for this basename: LIPASE, AMYLASE,  in the last 168 hours No results found for this basename: AMMONIA,  in the last 168 hours CBC:  Recent Labs Lab 05/29/13 2351  WBC 6.6  NEUTROABS 3.4  HGB 12.0  HCT 35.8*  MCV 85.4  PLT 173   Cardiac Enzymes: No results found for this basename: CKTOTAL, CKMB, CKMBINDEX, TROPONINI,  in the last 168 hours  BNP (last 3 results) No results found for this basename: PROBNP,  in the last 8760 hours CBG: No results found for this basename: GLUCAP,  in the last 168 hours  Radiological Exams on Admission: No results found.   Assessment/Plan Principal Problem:   GI bleed Active Problems:   HYPERTENSION, BENIGN   Atrial fibrillation   1. GI bleed - suspect lower GI bleed but given initial presentation with melanotic stools I have placed patient on Protonix IV infusion for now. Closely follow CBC. Type and screen has been ordered. Patient presently is hemodynamically stable. Discontinue aspirin. Consult GI in a.m. 2. Hypertension - since patient is n.p.o. I have placed patient on  clonidine patch which can be changed to patient the regular oral clonidine once patient is able to take orally. 3. Atrial fibrillation - presently rate controlled. 4. Diabetes mellitus type 2 - since patient is n.p.o. I have held patient's Amaryl and place patient on CBG checks with sliding-scale coverage.  I have reviewed patient's old charts and labs.  Code Status: Full code.  Family Communication: None.  Disposition Plan: Admit to inpatient.    Lawton Dollinger N. [facility] Pager [phone].  If 7PM-7AM, please contact night-coverage [email or web address] Password [facility] 05/30/2013, 4:21 AM

## 2013-05-30 NOTE — ED Provider Notes (Signed)
CSN: [id number]     Arrival date & time 05/29/13  2223 History   First MD Initiated Contact with Patient 05/30/13 0206     Chief Complaint  Patient presents with  . Rectal Bleeding     (Consider location/radiation/quality/duration/timing/severity/associated sxs/prior Treatment) HPI Comments: 73 year old female with a history of atrial fibrillation who had been on Coumadin in the past but who currently takes a baby aspirin. She was admitted in October of 2000 of 14 because of GI bleeding. She had an endoscopy showing some gastric erosions but also some evidence of lower GI bleeding on the colonoscopy though no source was identified. She did require a blood transfusion and reversal of her Coumadin, she has not had any trouble since that time. This evening approximately 5 hours prior to evaluation the patient had 2 separate episodes of dark blood and black-colored stool. She has had mild abdominal cramping but no shortness of breath, no dizziness or lightheadedness and no syncope. Nothing makes this better or worse, she has no pain with eating, no epigastric pain.  Gastroenterologist was Dr. Dulce Sellaroutlaw  Patient is a 73 y.o. female presenting with hematochezia. The history is provided by the patient and medical records.  Rectal Bleeding   Past Medical History  Diagnosis Date  . Diabetes mellitus   . Atrial fibrillation   . Hypertension   . Arthritis   . Coagulopathy    Past Surgical History  Procedure Laterality Date  . Cesarean section    . Replacement total knee bilateral    . Tubal ligation    . Esophagogastroduodenoscopy Left 01/23/2013    Procedure: ESOPHAGOGASTRODUODENOSCOPY (EGD);  Surgeon: Willis ModenaWilliam Outlaw, MD;  [facility];  Service: Endoscopy;  Laterality: Left;  . Colonoscopy Left 01/24/2013    Procedure: COLONOSCOPY;  Surgeon: Willis ModenaWilliam Outlaw, MD;  [facility];  Service: Endoscopy;  Laterality: Left;   History reviewed. No pertinent family history. History   Substance Use Topics  . Smoking status: Never Smoker   . Smokeless tobacco: Never Used  . Alcohol Use: Yes     Comment: occasional    OB History   Grav Para Term Preterm Abortions TAB SAB Ect Mult Living                 Review of Systems  Gastrointestinal: Positive for hematochezia.  All other systems reviewed and are negative.      Allergies  Aspirin; Hydrocodone; and Rofecoxib  Home Medications   Current Outpatient Rx  Name  Route  Sig  Dispense  Refill  . cloNIDine (CATAPRES) 0.1 MG tablet   Oral   Take 0.1 mg by mouth 2 (two) times daily.          . furosemide (LASIX) 40 MG tablet   Oral   Take 40 mg by mouth daily as needed (swelling).         [name]. glimepiride (AMARYL) 2 MG tablet   Oral   Take 2 mg by mouth daily before breakfast.         . hydrochlorothiazide (MICROZIDE) 12.5 MG capsule   Oral   Take 12.5 mg by mouth daily.         [name]. HYDROcodone-acetaminophen (NORCO/VICODIN) 5-325 MG per tablet   Oral   Take 1 tablet by mouth 2 (two) times daily as needed for pain.         [name]. lisinopril (PRINIVIL,ZESTRIL) 20 MG tablet   Oral   Take 20 mg by mouth daily.         .[name]  lovastatin (MEVACOR) 20 MG tablet   Oral   Take 20 mg by mouth at bedtime.         [name] oxybutynin (DITROPAN-XL) 5 MG 24 hr tablet   Oral   Take 5 mg by mouth daily.         . pantoprazole (PROTONIX) 40 MG tablet   Oral   Take 1 tablet (40 mg total) by mouth daily at 12 noon.   30 tablet   1   . potassium chloride SA (K-DUR,KLOR-CON) 20 MEQ tablet   Oral   Take 20 mEq by mouth daily.          [name] warfarin (COUMADIN) 5 MG tablet   Oral   Take 1 tablet (5 mg total) by mouth daily. STOP COUMADIN FOR 2 WEEKS          BP 146/95  Pulse 93  Temp(Src) 98 F (36.7 C) (Oral)  Resp 18  Ht 5\' 7"  (1.702 m)  Wt 245 lb (111.131 kg)  BMI 38.36 kg/m2  SpO2 99% Physical Exam  Nursing note and vitals reviewed. Constitutional: She appears well-developed and well-nourished.  No distress.  HENT:  Head: Normocephalic and atraumatic.  Mouth/Throat: Oropharynx is clear and moist. No oropharyngeal exudate.  Eyes: Conjunctivae and EOM are normal. Pupils are equal, round, and reactive to light. Right eye exhibits no discharge. Left eye exhibits no discharge. No scleral icterus.  Neck: Normal range of motion. Neck supple. No JVD present. No thyromegaly present.  Cardiovascular: Normal heart sounds and intact distal pulses.  Exam reveals no gallop and no friction rub.   No murmur heard. Mild tachycardia, irregularly irregular rhythm consistent with atrial fibrillation  Pulmonary/Chest: Effort normal and breath sounds normal. No respiratory distress. She has no wheezes. She has no rales.  Abdominal: Soft. Bowel sounds are normal. She exhibits no distension and no mass. There is no tenderness.  Genitourinary:  Chaperone present for exam, melanotic stool and some dark red blood present in the rectal vault, no fissures or hemorrhoids, masses or tenderness palpated  Musculoskeletal: Normal range of motion. She exhibits no edema and no tenderness.  Lymphadenopathy:    She has no cervical adenopathy.  Neurological: She is alert. Coordination normal.  Skin: Skin is warm and dry. No rash noted. No erythema.  Psychiatric: She has a normal mood and affect. Her behavior is normal.    ED Course  Procedures (including critical care time) Labs Review Labs Reviewed  CBC WITH DIFFERENTIAL - Abnormal; Notable for the following:    HCT 35.8 (*)    All other components within normal limits  COMPREHENSIVE METABOLIC PANEL - Abnormal; Notable for the following:    Glucose, Bld 116 (*)    Creatinine, Ser 1.28 (*)    Albumin 3.4 (*)    GFR calc non Af Amer 40 (*)    GFR calc Af Amer 47 (*)    All other components within normal limits  POC OCCULT BLOOD, ED - Abnormal; Notable for the following:    Fecal Occult Bld POSITIVE (*)    All other components within normal limits  APTT   PROTIME-INR  TYPE AND SCREEN   Imaging Review No results found.  EKG Interpretation   None       MDM   Final diagnoses:  GI bleed    The patient appears to have recurrent gastrointestinal bleeding, appears to be upper GI bleeding, hemoglobin is normal at 12.0, creatinine is 1.3 and BUN is 15. As the  patient is on aspirin and has required transfusion in the past likely will need observational admission. Pepcid and Protonix ordered.  Hemoccult consistent with melena, patient will be admitted to the hospitalist service, discussed with Dr.kakrakandy  Meds given in ED:  Medications  famotidine (PEPCID) IVPB 20 mg (not administered)  pantoprazole (PROTONIX) injection 40 mg (not administered)      Vida Roller, MD 05/30/13 [phone]

## 2013-05-30 NOTE — ED Notes (Signed)
Handoff report given to floor RN.

## 2013-05-30 NOTE — Consult Note (Signed)
[facility] CONSULT Reason for consult: G.I. bleeding Referring Physician: [facility]. PCP: Dr. Rockwell Germany Robin Snow is an 73 y.o. female.  HPI: she was admitted 10/14 with G.I. bleeding and had EGD and colonoscopy at that time by Dr. Dulce Sellar. EGD showed slight gastritis and colonoscopy should diverticulosis. Patient continued to have issues with bleeding and underwent a G.I. bleeding scan that showed active G.I. bleeding and tranverse colon. Colonoscopy did not show any active bleeding in that area. This led to angiogram but at that point the G.I. bleeding and stopped in no active bleeding is seen. The patient had been on Coumadin but this was discontinued. She had taken some Advil several days ago for knee pain. She had 2 episodes yesterday BRB/maroon stools. Denies constipation or abdominal pain. She is hungry. Her hemoglobin is remaining stable over 10 since her admission.  Past Medical History  Diagnosis Date  . Diabetes mellitus   . Atrial fibrillation   . Hypertension   . Arthritis   . Coagulopathy     Past Surgical History  Procedure Laterality Date  . Cesarean section    . Replacement total knee bilateral    . Tubal ligation    . Esophagogastroduodenoscopy Left 01/23/2013    Procedure: ESOPHAGOGASTRODUODENOSCOPY (EGD);  Surgeon: Willis Modena, MD;  Location: [facility] ENDOSCOPY;  Service: Endoscopy;  Laterality: Left;  . Colonoscopy Left 01/24/2013    Procedure: COLONOSCOPY;  Surgeon: Willis Modena, MD;  Location: [facility] ENDOSCOPY;  Service: Endoscopy;  Laterality: Left;    History reviewed. No pertinent family history.  Social History:  reports that she has never smoked. She has never used smokeless tobacco. She reports that she drinks alcohol. She reports that she does not use illicit drugs.  Allergies:  Allergies  Allergen Reactions  . Aspirin Hives  . Hydrocodone Hives  . Rofecoxib Hives    Medications; Prior to Admission medications   Medication Sig  Start Date End Date Taking? Authorizing Provider  acetaminophen (TYLENOL) 500 MG tablet Take 500 mg by mouth every 6 (six) hours as needed for mild pain.   Yes Historical Provider, MD  aspirin EC 81 MG tablet Take 81 mg by mouth daily.   Yes Historical Provider, MD  cloNIDine (CATAPRES) 0.1 MG tablet Take 0.1 mg by mouth 2 (two) times daily.    Yes Historical Provider, MD  furosemide (LASIX) 40 MG tablet Take 40 mg by mouth daily as needed (swelling).   Yes Historical Provider, MD  glimepiride (AMARYL) 2 MG tablet Take 2 mg by mouth daily before breakfast.   Yes Historical Provider, MD  hydrochlorothiazide (MICROZIDE) 12.5 MG capsule Take 12.5 mg by mouth daily.   Yes Historical Provider, MD  lisinopril (PRINIVIL,ZESTRIL) 20 MG tablet Take 20 mg by mouth daily.   Yes Historical Provider, MD  lovastatin (MEVACOR) 20 MG tablet Take 20 mg by mouth at bedtime.   Yes Historical Provider, MD  oxybutynin (DITROPAN-XL) 5 MG 24 hr tablet Take 5 mg by mouth daily.   Yes Historical Provider, MD  pantoprazole (PROTONIX) 40 MG tablet Take 1 tablet (40 mg total) by mouth daily at 12 noon. 01/27/13  Yes Vassie Loll, MD  potassium chloride SA (K-DUR,KLOR-CON) 20 MEQ tablet Take 20 mEq by mouth daily.    Yes Historical Provider, MD   . cloNIDine  0.1 mg Transdermal Q Mon  . lisinopril  20 mg Oral Daily  . oxybutynin  5 mg Oral Daily  . [START ON 06/02/2013] pantoprazole (PROTONIX) IV  40  mg Intravenous Q12H  . simvastatin  10 mg Oral q1800   PRN Meds acetaminophen, acetaminophen, ondansetron (ZOFRAN) IV, ondansetron Results for orders placed during the hospital encounter of 05/30/13 (from the past 48 hour(s))  CBC WITH DIFFERENTIAL     Status: Abnormal   Collection Time    05/29/13 11:51 PM      Result Value Ref Range   WBC 6.6  4.0 - 10.5 K/uL   RBC 4.19  3.87 - 5.11 MIL/uL   Hemoglobin 12.0  12.0 - 15.0 g/dL   HCT 35.8 (*) 36.0 - 46.0 %   MCV 85.4  78.0 - 100.0 fL   MCH 28.6  26.0 - 34.0 pg   MCHC  33.5  30.0 - 36.0 g/dL   RDW 14.9  11.5 - 15.5 %   Platelets 173  150 - 400 K/uL   Neutrophils Relative % 52  43 - 77 %   Neutro Abs 3.4  1.7 - 7.7 K/uL   Lymphocytes Relative 36  12 - 46 %   Lymphs Abs 2.3  0.7 - 4.0 K/uL   Monocytes Relative 9  3 - 12 %   Monocytes Absolute 0.6  0.1 - 1.0 K/uL   Eosinophils Relative 3  0 - 5 %   Eosinophils Absolute 0.2  0.0 - 0.7 K/uL   Basophils Relative 0  0 - 1 %   Basophils Absolute 0.0  0.0 - 0.1 K/uL  COMPREHENSIVE METABOLIC PANEL     Status: Abnormal   Collection Time    05/29/13 11:51 PM      Result Value Ref Range   Sodium 143  137 - 147 mEq/L   Potassium 4.3  3.7 - 5.3 mEq/L   Chloride 103  96 - 112 mEq/L   CO2 28  19 - 32 mEq/L   Glucose, Bld 116 (*) 70 - 99 mg/dL   BUN 15  6 - 23 mg/dL   Creatinine, Ser 1.28 (*) 0.50 - 1.10 mg/dL   Calcium 9.4  8.4 - 10.5 mg/dL   Total Protein 7.4  6.0 - 8.3 g/dL   Albumin 3.4 (*) 3.5 - 5.2 g/dL   AST 13  0 - 37 U/L   ALT 10  0 - 35 U/L   Alkaline Phosphatase 82  39 - 117 U/L   Total Bilirubin 0.4  0.3 - 1.2 mg/dL   GFR calc non Af Amer 40 (*) >90 mL/min   GFR calc Af Amer 47 (*) >90 mL/min   Comment: (NOTE)     The eGFR has been calculated using the CKD EPI equation.     This calculation has not been validated in all clinical situations.     eGFR's persistently <90 mL/min signify possible Chronic Kidney     Disease.  POC OCCULT BLOOD, ED     Status: Abnormal   Collection Time    05/30/13  2:29 AM      Result Value Ref Range   Fecal Occult Bld POSITIVE (*) NEGATIVE  TYPE AND SCREEN     Status: None   Collection Time    05/30/13  2:32 AM      Result Value Ref Range   ABO/RH(D) A POS     Antibody Screen POS     Sample Expiration 06/02/2013     Antibody Identification ANTI LEA (Lewis a)     DAT, IgG NEG     PT AG Type POSITIVE FOR M ANTIGEN    APTT  Status: None   Collection Time    05/30/13  2:50 AM      Result Value Ref Range   aPTT 32  24 - 37 seconds  PROTIME-INR      Status: None   Collection Time    05/30/13  2:50 AM      Result Value Ref Range   Prothrombin Time 13.2  11.6 - 15.2 seconds   INR 1.02  0.00 - 1.49  GLUCOSE, CAPILLARY     Status: Abnormal   Collection Time    05/30/13  4:12 AM      Result Value Ref Range   Glucose-Capillary 105 (*) 70 - 99 mg/dL  CBC     Status: Abnormal   Collection Time    05/30/13  6:15 AM      Result Value Ref Range   WBC 6.0  4.0 - 10.5 K/uL   RBC 3.73 (*) 3.87 - 5.11 MIL/uL   Hemoglobin 10.9 (*) 12.0 - 15.0 g/dL   HCT 32.1 (*) 36.0 - 46.0 %   MCV 86.1  78.0 - 100.0 fL   MCH 29.2  26.0 - 34.0 pg   MCHC 34.0  30.0 - 36.0 g/dL   RDW 15.0  11.5 - 15.5 %   Platelets 151  150 - 400 K/uL  COMPREHENSIVE METABOLIC PANEL     Status: Abnormal   Collection Time    05/30/13  6:15 AM      Result Value Ref Range   Sodium 146  137 - 147 mEq/L   Potassium 4.2  3.7 - 5.3 mEq/L   Chloride 107  96 - 112 mEq/L   CO2 28  19 - 32 mEq/L   Glucose, Bld 97  70 - 99 mg/dL   BUN 17  6 - 23 mg/dL   Creatinine, Ser 1.04  0.50 - 1.10 mg/dL   Calcium 9.0  8.4 - 10.5 mg/dL   Total Protein 6.7  6.0 - 8.3 g/dL   Albumin 3.1 (*) 3.5 - 5.2 g/dL   AST 12  0 - 37 U/L   ALT 10  0 - 35 U/L   Alkaline Phosphatase 73  39 - 117 U/L   Total Bilirubin 0.4  0.3 - 1.2 mg/dL   GFR calc non Af Amer 52 (*) >90 mL/min   GFR calc Af Amer 60 (*) >90 mL/min   Comment: (NOTE)     The eGFR has been calculated using the CKD EPI equation.     This calculation has not been validated in all clinical situations.     eGFR's persistently <90 mL/min signify possible Chronic Kidney     Disease.  LACTIC ACID, PLASMA     Status: None   Collection Time    05/30/13  6:15 AM      Result Value Ref Range   Lactic Acid, Venous 1.8  0.5 - 2.2 mmol/L  CBC     Status: Abnormal   Collection Time    05/30/13  7:22 AM      Result Value Ref Range   WBC 5.7  4.0 - 10.5 K/uL   RBC 3.54 (*) 3.87 - 5.11 MIL/uL   Hemoglobin 10.3 (*) 12.0 - 15.0 g/dL   HCT 30.2 (*)  36.0 - 46.0 %   MCV 85.3  78.0 - 100.0 fL   MCH 29.1  26.0 - 34.0 pg   MCHC 34.1  30.0 - 36.0 g/dL   RDW 14.9  11.5 - 15.5 %  Platelets 154  150 - 400 K/uL  GLUCOSE, CAPILLARY     Status: None   Collection Time    05/30/13  7:26 AM      Result Value Ref Range   Glucose-Capillary 91  70 - 99 mg/dL  GLUCOSE, CAPILLARY     Status: None   Collection Time    05/30/13 11:36 AM      Result Value Ref Range   Glucose-Capillary 97  70 - 99 mg/dL  CBC     Status: Abnormal   Collection Time    05/30/13 11:40 AM      Result Value Ref Range   WBC 5.4  4.0 - 10.5 K/uL   RBC 3.63 (*) 3.87 - 5.11 MIL/uL   Hemoglobin 10.4 (*) 12.0 - 15.0 g/dL   HCT 31.2 (*) 36.0 - 46.0 %   MCV 86.0  78.0 - 100.0 fL   MCH 28.7  26.0 - 34.0 pg   MCHC 33.3  30.0 - 36.0 g/dL   RDW 15.1  11.5 - 15.5 %   Platelets 149 (*) 150 - 400 K/uL    No results found.             Blood pressure 124/82, pulse 80, temperature 98 F (36.7 C), temperature source Oral, resp. rate 16, height $RemoveBe'5\' 7"'OuTZhYlwl$  (1.702 m), weight 111.131 kg (245 lb), SpO2 96.00%.  Physical exam:   General-- pleasant African-American female in no distress Heart-- appears to have a regular rate and rhythm Lungs--clear Abdomen-- soft and nontender   Assessment: 1. Lower G.I. bleeding. This is suggested by the BUN creatinine ratio relatively stable hemoglobin. Probably diverticula origin. 2. Atrial fibrillation. Currently not anticoagulated 3. Type II diabetes  Plan: 1. We'll begin the patient in clear liquid to Miralax and hopefully the bleedin will stop. We will follow her with you   Armonte Tortorella JR,Gadge Hermiz L 05/30/2013, 4:58 PM

## 2013-05-30 NOTE — Progress Notes (Signed)
Subjective: Patient seen and examined, admitted with lower GI bleed. Patient had colonoscopy and endoscopy  done in October 2014,which did not show any significant source of GI Bleed.  Filed Vitals:   05/30/13 1324  BP: 124/82  Pulse: 80  Temp: 98 F (36.7 C)  Resp: 16    Chest: Clear Bilaterally Heart : S1S2 RRR Abdomen: Soft, nontender Ext : No edema Neuro: Alert, oriented x 3  A/P Lower GI Bleed Hypertension A Fib Diabetes Mellitus  Will consult [facility].Hemoglobin has been stable.    Meredeth IdeGagan S Jovonte Commins [facility] [phone]

## 2013-05-30 NOTE — Progress Notes (Signed)
Attempted to call RN twice. Unavailable to answer at this time. Per RN, will call back later.

## 2013-05-31 LAB — HEMOGLOBIN AND HEMATOCRIT, BLOOD
HEMATOCRIT: 31.6 % — AB (ref 36.0–46.0)
HEMATOCRIT: 30.7 % — AB (ref 36.0–46.0)
Hemoglobin: 10.7 g/dL — ABNORMAL LOW (ref 12.0–15.0)
Hemoglobin: 10.4 g/dL — ABNORMAL LOW (ref 12.0–15.0)

## 2013-05-31 LAB — GLUCOSE, CAPILLARY
Glucose-Capillary: 104 mg/dL — ABNORMAL HIGH (ref 70–99)
Glucose-Capillary: 103 mg/dL — ABNORMAL HIGH (ref 70–99)
Glucose-Capillary: 98 mg/dL (ref 70–99)

## 2013-05-31 MED ORDER — INFLUENZA VAC SPLIT QUAD 0.5 ML IM SUSP
0.5000 mL | INTRAMUSCULAR | Status: AC
  Administered 2013-06-01: 0.5 mL via INTRAMUSCULAR
  Filled 2013-05-31: qty 0.5

## 2013-05-31 NOTE — Progress Notes (Signed)
Patient Demographics  Robin Snow, is a 73 y.o. female, DOB - 1940-07-18, GNF:[id number]  Admit date - 05/30/2013   Admitting Physician Eduard Clos, MD  Outpatient Primary MD for the patient is Burtis Junes, MD  LOS - 1   Chief Complaint  Patient presents with  . Rectal Bleeding        Assessment & Plan    GI bleed - suspect lower GI bleed, BUN/creatinine stable, H&H remain stable, type screen done, continue to hold aspirin along with IV PPI, her diet, GI following.      Hypertension - stable on hydralazine, ACE and clonidine.        Atrial fibrillation - presently rate controlled. Not a anticoagulation candidate, due to #1 above      Diabetes mellitus type 2 - patient's Amaryl and place patient on CBG checks with sliding-scale coverage.  CBG (last 3)   Recent Labs  05/30/13 1718 05/30/13 2038 05/31/13 0723  GLUCAP 100* 148* 104*       Dyslipidemia continue home dose statin.       Code Status: Full  Family Communication:    Disposition Plan: Home   Procedures     Consults GI   Medications  Scheduled Meds: . cloNIDine  0.1 mg Transdermal Q Mon  . hydrALAZINE  25 mg Oral 4 times per day  . [START ON 06/01/2013] influenza vac split quadrivalent PF  0.5 mL Intramuscular Tomorrow-1000  . lisinopril  20 mg Oral Daily  . oxybutynin  5 mg Oral Daily  . [START ON 06/02/2013] pantoprazole (PROTONIX) IV  40 mg Intravenous Q12H  . polyethylene glycol  17 g Oral TID  . simvastatin  10 mg Oral q1800   Continuous Infusions: . pantoprozole (PROTONIX) infusion 8 mg/hr (05/31/13 0008)   PRN Meds:.acetaminophen, ondansetron (ZOFRAN) IV  DVT Prophylaxis   SCDs    Lab Results  Component Value Date   PLT 149* 05/30/2013    Antibiotics     Anti-infectives   None          Subjective:   Tima Than today has, No headache, No chest pain, No abdominal pain - No Nausea, No new weakness tingling or numbness, No Cough - SOB.    Objective:   Filed Vitals:   05/30/13 2034 05/31/13 0049 05/31/13 0449 05/31/13 0811  BP: 132/90 168/89 138/81 145/71  Pulse: 77  76 88  Temp: 98.6 F (37 C)  98.6 F (37 C) 98.5 F (36.9 C)  TempSrc: Oral  Oral Oral  Resp: 18  18 18   Height: 5\' 7"  (1.702 m)     Weight: 111.131 kg (245 lb)     SpO2: 99%  99% 98%    Wt Readings from Last 3 Encounters:  05/30/13 111.131 kg (245 lb)  01/27/13 108 kg (238 lb 1.6 oz)  01/27/13 108 kg (238 lb 1.6 oz)     Intake/Output Summary (Last 24 hours) at 05/31/13 1135 Last data filed at 05/31/13 0921  Gross per 24 hour  Intake    480 ml  Output      0 ml  Net    480 ml     Physical Exam  Awake Alert, Oriented X 3, No new F.N deficits, Normal  affect [place].AT,PERRAL Supple Neck,No JVD, No cervical lymphadenopathy appriciated.  Symmetrical Chest wall movement, Good air movement bilaterally, CTAB RRR,No Gallops,Rubs or new Murmurs, No Parasternal Heave +ve B.Sounds, Abd Soft, Non tender, No organomegaly appriciated, No rebound - guarding or rigidity. No Cyanosis, Clubbing or edema, No new Rash or bruise     Data Review   Micro Results No results found for this or any previous visit (from the past 240 hour(s)).  Radiology Reports No results found.  CBC  Recent Labs Lab 05/29/13 2351 05/30/13 0615 05/30/13 0722 05/30/13 1140  WBC 6.6 6.0 5.7 5.4  HGB 12.0 10.9* 10.3* 10.4*  HCT 35.8* 32.1* 30.2* 31.2*  PLT 173 151 154 149*  MCV 85.4 86.1 85.3 86.0  MCH 28.6 29.2 29.1 28.7  MCHC 33.5 34.0 34.1 33.3  RDW 14.9 15.0 14.9 15.1  LYMPHSABS 2.3  --   --   --   MONOABS 0.6  --   --   --   EOSABS 0.2  --   --   --   BASOSABS 0.0  --   --   --     Chemistries   Recent Labs Lab 05/29/13 2351 05/30/13 0615  NA 143 146  K  4.3 4.2  CL 103 107  CO2 28 28  GLUCOSE 116* 97  BUN 15 17  CREATININE 1.28* 1.04  CALCIUM 9.4 9.0  AST 13 12  ALT 10 10  ALKPHOS 82 73  BILITOT 0.4 0.4   ------------------------------------------------------------------------------------------------------------------ estimated creatinine clearance is 61.9 ml/min (by C-G formula based on Cr of 1.04). ------------------------------------------------------------------------------------------------------------------ No results found for this basename: HGBA1C,  in the last 72 hours ------------------------------------------------------------------------------------------------------------------ No results found for this basename: CHOL, HDL, LDLCALC, TRIG, CHOLHDL, LDLDIRECT,  in the last 72 hours ------------------------------------------------------------------------------------------------------------------ No results found for this basename: TSH, T4TOTAL, FREET3, T3FREE, THYROIDAB,  in the last 72 hours ------------------------------------------------------------------------------------------------------------------ No results found for this basename: VITAMINB12, FOLATE, FERRITIN, TIBC, IRON, RETICCTPCT,  in the last 72 hours  Coagulation profile  Recent Labs Lab 05/30/13 0250  INR 1.02    No results found for this basename: DDIMER,  in the last 72 hours  Cardiac Enzymes No results found for this basename: CK, CKMB, TROPONINI, MYOGLOBIN,  in the last 168 hours ------------------------------------------------------------------------------------------------------------------ No components found with this basename: POCBNP,      Time Spent in minutes   35   Vickey Boak K M.D on 05/31/2013 at 11:35 AM  Between 7am to 7pm - Pager - [phone]  After 7pm go to [email or web address] - password [email or web address]  And look for the night coverage person covering for me after hours  [facility] Office  [phone]

## 2013-05-31 NOTE — Progress Notes (Signed)
[facility] PROGRESS NOTE Subjective Dark stool with Miralax no BRB  Objective: Vital signs in last 24 hours: Temp:  [98.5 F (36.9 C)-99 F (37.2 C)] 98.7 F (37.1 C) (02/25 1256) Pulse Rate:  [76-91] 91 (02/25 1256) Resp:  [16-18] 16 (02/25 1256) BP: (127-168)/(71-112) 127/82 mmHg (02/25 1256) SpO2:  [98 %-100 %] 100 % (02/25 1256) Weight:  [111.131 kg (245 lb)] 111.131 kg (245 lb) (02/24 2034) Last BM Date: 05/31/13  Intake/Output from previous day: 02/24 0701 - 02/25 0700 In: 360 [P.O.:360] Out: -  Intake/Output this shift: Total I/O In: 240 [P.O.:240] Out: -     Lab Results:  Recent Labs  05/29/13 2351 05/30/13 0615 05/30/13 0722 05/30/13 1140 05/31/13 1150  WBC 6.6 6.0 5.7 5.4  --   HGB 12.0 10.9* 10.3* 10.4* 10.7*  HCT 35.8* 32.1* 30.2* 31.2* 31.6*  PLT 173 151 154 149*  --    BMET  Recent Labs  05/29/13 2351 05/30/13 0615  NA 143 146  K 4.3 4.2  CL 103 107  CO2 28 28  CREATININE 1.28* 1.04   LFT  Recent Labs  05/29/13 2351 05/30/13 0615  PROT 7.4 6.7  AST 13 12  ALT 10 10  ALKPHOS 82 73  BILITOT 0.4 0.4   PT/INR  Recent Labs  05/30/13 0250  LABPROT 13.2  INR 1.02   PANCREAS No results found for this basename: LIPASE,  in the last 72 hours       Studies/Results: No results found.  Medications: I have reviewed the patient's current medications.  Assessment/Plan: 1. LGI Bleed. Appears stable Continue miralax ? Advance diet if Hg stable in AM   Sudais Banghart JR,Amond Speranza L 05/31/2013, 2:34 PM

## 2013-06-01 LAB — HEMOGLOBIN AND HEMATOCRIT, BLOOD
HCT: 29.1 % — ABNORMAL LOW (ref 36.0–46.0)
HCT: 32.4 % — ABNORMAL LOW (ref 36.0–46.0)
HEMATOCRIT: 29.2 % — AB (ref 36.0–46.0)
HEMOGLOBIN: 10.1 g/dL — AB (ref 12.0–15.0)
Hemoglobin: 9.8 g/dL — ABNORMAL LOW (ref 12.0–15.0)
Hemoglobin: 11.1 g/dL — ABNORMAL LOW (ref 12.0–15.0)

## 2013-06-01 LAB — GLUCOSE, CAPILLARY
GLUCOSE-CAPILLARY: 120 mg/dL — AB (ref 70–99)
Glucose-Capillary: 97 mg/dL (ref 70–99)
Glucose-Capillary: 97 mg/dL (ref 70–99)
Glucose-Capillary: 99 mg/dL (ref 70–99)

## 2013-06-01 MED ORDER — HYDRALAZINE HCL 25 MG PO TABS
25.0000 mg | ORAL_TABLET | Freq: Three times a day (TID) | ORAL | Status: DC | PRN
  Filled 2013-06-01: qty 1

## 2013-06-01 MED ORDER — PANTOPRAZOLE SODIUM 40 MG PO TBEC
40.0000 mg | DELAYED_RELEASE_TABLET | Freq: Every day | ORAL | Status: DC
  Administered 2013-06-01 – 2013-06-03 (×3): 40 mg via ORAL
  Filled 2013-06-01 (×3): qty 1

## 2013-06-01 MED ORDER — FUROSEMIDE 10 MG/ML IJ SOLN
20.0000 mg | Freq: Once | INTRAMUSCULAR | Status: AC
  Administered 2013-06-01: 20 mg via INTRAVENOUS
  Filled 2013-06-01 (×4): qty 2

## 2013-06-01 MED ORDER — HYDRALAZINE HCL 50 MG PO TABS
50.0000 mg | ORAL_TABLET | Freq: Three times a day (TID) | ORAL | Status: DC | PRN
  Administered 2013-06-01: 25 mg via ORAL
  Filled 2013-06-01: qty 1

## 2013-06-01 NOTE — Progress Notes (Addendum)
Patient Demographics  Robin Snow, is a 73 y.o. female, DOB - [birth year], VWA:[id number]  Admit date - 05/30/2013   Admitting Physician Eduard Clos, MD  Outpatient Primary MD for the patient is Burtis Junes, MD  LOS - 2   Chief Complaint  Patient presents with  . Rectal Bleeding        Assessment & Plan    GI bleed - suspect lower GI bleed, BUN/creatinine stable, H&H remain stable, type screen done, continue to hold aspirin switched to oral PPI, on liquid diet, GI following.      Hypertension - on ACE and clonidine, increased hydralazine dose for better control.       Atrial fibrillation - presently rate controlled. Not a anticoagulation candidate, due to #1 above      Diabetes mellitus type 2 - patient's Amaryl and place patient on CBG checks with sliding-scale coverage.  CBG (last 3)   Recent Labs  05/31/13 1629 06/01/13 0008 06/01/13 0557  GLUCAP 98 99 97       Dyslipidemia continue home dose statin.       Code Status: Full  Family Communication:    Disposition Plan: Home   Procedures     Consults GI   Medications  Scheduled Meds: . cloNIDine  0.1 mg Transdermal Q Mon  . furosemide  20 mg Intravenous Once  . influenza vac split quadrivalent PF  0.5 mL Intramuscular Tomorrow-1000  . lisinopril  20 mg Oral Daily  . oxybutynin  5 mg Oral Daily  . pantoprazole  40 mg Oral Daily  . polyethylene glycol  17 g Oral TID  . simvastatin  10 mg Oral q1800   Continuous Infusions:   PRN Meds:.acetaminophen, hydrALAZINE, ondansetron (ZOFRAN) IV  DVT Prophylaxis   SCDs    Lab Results  Component Value Date   PLT 149* 05/30/2013    Antibiotics    Anti-infectives   None          Subjective:   Robin Snow today has, No  headache, No chest pain, No abdominal pain - No Nausea, No new weakness tingling or numbness, No Cough - SOB.  Not noticed any fresh blood but some dark old blood in stool   Objective:   Filed Vitals:   05/31/13 1821 05/31/13 2054 06/01/13 0017 06/01/13 0601  BP: 126/95 146/75 142/90 159/94  Pulse: 102 90  97  Temp: 98.6 F (37 C) 98.8 F (37.1 C)  98.6 F (37 C)  TempSrc: Oral Oral  Oral  Resp: 16 17  18   Height:      Weight:  119.659 kg (263 lb 12.8 oz)    SpO2: 100% 100%  100%    Wt Readings from Last 3 Encounters:  05/31/13 119.659 kg (263 lb 12.8 oz)  01/27/13 108 kg (238 lb 1.6 oz)  01/27/13 108 kg (238 lb 1.6 oz)     Intake/Output Summary (Last 24 hours) at 06/01/13 1143 Last data filed at 06/01/13 0601  Gross per 24 hour  Intake  572.5 ml  Output      0 ml  Net  572.5 ml     Physical Exam  Awake Alert, Oriented X 3, No new F.N deficits, Normal affect [place].AT,PERRAL Supple Neck,No JVD,  No cervical lymphadenopathy appriciated.  Symmetrical Chest wall movement, Good air movement bilaterally, CTAB RRR,No Gallops,Rubs or new Murmurs, No Parasternal Heave +ve B.Sounds, Abd Soft, Non tender, No organomegaly appriciated, No rebound - guarding or rigidity. No Cyanosis, Clubbing , 1+ chronic leg edema, No new Rash or bruise     Data Review   Micro Results No results found for this or any previous visit (from the past 240 hour(s)).  Radiology Reports No results found.  CBC  Recent Labs Lab 05/29/13 2351 05/30/13 0615 05/30/13 0722 05/30/13 1140 05/31/13 1150 05/31/13 1926 06/01/13 0356  WBC 6.6 6.0 5.7 5.4  --   --   --   HGB 12.0 10.9* 10.3* 10.4* 10.7* 10.4* 9.8*  HCT 35.8* 32.1* 30.2* 31.2* 31.6* 30.7* 29.1*  PLT 173 151 154 149*  --   --   --   MCV 85.4 86.1 85.3 86.0  --   --   --   MCH 28.6 29.2 29.1 28.7  --   --   --   MCHC 33.5 34.0 34.1 33.3  --   --   --   RDW 14.9 15.0 14.9 15.1  --   --   --   LYMPHSABS 2.3  --   --   --   --   --   --    MONOABS 0.6  --   --   --   --   --   --   EOSABS 0.2  --   --   --   --   --   --   BASOSABS 0.0  --   --   --   --   --   --     Chemistries   Recent Labs Lab 05/29/13 2351 05/30/13 0615  NA 143 146  K 4.3 4.2  CL 103 107  CO2 28 28  GLUCOSE 116* 97  BUN 15 17  CREATININE 1.28* 1.04  CALCIUM 9.4 9.0  AST 13 12  ALT 10 10  ALKPHOS 82 73  BILITOT 0.4 0.4   ------------------------------------------------------------------------------------------------------------------ estimated creatinine clearance is 64.5 ml/min (by C-G formula based on Cr of 1.04). ------------------------------------------------------------------------------------------------------------------ No results found for this basename: HGBA1C,  in the last 72 hours ------------------------------------------------------------------------------------------------------------------ No results found for this basename: CHOL, HDL, LDLCALC, TRIG, CHOLHDL, LDLDIRECT,  in the last 72 hours ------------------------------------------------------------------------------------------------------------------ No results found for this basename: TSH, T4TOTAL, FREET3, T3FREE, THYROIDAB,  in the last 72 hours ------------------------------------------------------------------------------------------------------------------ No results found for this basename: VITAMINB12, FOLATE, FERRITIN, TIBC, IRON, RETICCTPCT,  in the last 72 hours  Coagulation profile  Recent Labs Lab 05/30/13 0250  INR 1.02    No results found for this basename: DDIMER,  in the last 72 hours  Cardiac Enzymes No results found for this basename: CK, CKMB, TROPONINI, MYOGLOBIN,  in the last 168 hours ------------------------------------------------------------------------------------------------------------------ No components found with this basename: POCBNP,      Time Spent in minutes   35   Robin Snow K M.D on 06/01/2013 at 11:43 AM  Between  7am to 7pm - Pager - [phone]  After 7pm go to [email or web address] - password [email or web address]  And look for the night coverage person covering for me after hours  [facility] Office  [phone]

## 2013-06-01 NOTE — Progress Notes (Signed)
[facility] PROGRESS NOTE Subjective patient reports stools dark but no bright blood none during the night  Objective: Vital signs in last 24 hours: Temp:  [98.5 F (36.9 C)-98.8 F (37.1 C)] 98.6 F (37 C) (02/26 0601) Pulse Rate:  [88-102] 97 (02/26 0601) Resp:  [16-18] 18 (02/26 0601) BP: (126-159)/(71-95) 159/94 mmHg (02/26 0601) SpO2:  [98 %-100 %] 100 % (02/26 0601) Weight:  [119.659 kg (263 lb 12.8 oz)] 119.659 kg (263 lb 12.8 oz) (02/25 2054) Last BM Date: 05/31/13  Intake/Output from previous day: 02/25 0701 - 02/26 0700 In: 692.5 [P.O.:480; I.V.:212.5] Out: -  Intake/Output this shift: Total I/O In: 120 [P.O.:120] Out: -   PE: General-- no acute distress watching television  Abdomen-- soft nontender none distended Lab Results:  Recent Labs  05/29/13 2351 05/30/13 0615 05/30/13 0722 05/30/13 1140 05/31/13 1150 05/31/13 1926 06/01/13 0356  WBC 6.6 6.0 5.7 5.4  --   --   --   HGB 12.0 10.9* 10.3* 10.4* 10.7* 10.4* 9.8*  HCT 35.8* 32.1* 30.2* 31.2* 31.6* 30.7* 29.1*  PLT 173 151 154 149*  --   --   --    BMET  Recent Labs  05/29/13 2351 05/30/13 0615  NA 143 146  K 4.3 4.2  CL 103 107  CO2 28 28  CREATININE 1.28* 1.04   LFT  Recent Labs  05/29/13 2351 05/30/13 0615  PROT 7.4 6.7  AST 13 12  ALT 10 10  ALKPHOS 82 73  BILITOT 0.4 0.4   PT/INR  Recent Labs  05/30/13 0250  LABPROT 13.2  INR 1.02   PANCREAS No results found for this basename: LIPASE,  in the last 72 hours       Studies/Results: No results found.  Medications: I have reviewed the patient's current medications.  Assessment/Plan: 1. Lower G.I. bleeding appears to have resolved. Probably diverticular. Would fall hemoglobin and will advance to full liquids.   Izsak Meir JR,Michial Disney L 06/01/2013, 6:50 AM

## 2013-06-02 LAB — GLUCOSE, CAPILLARY
GLUCOSE-CAPILLARY: 100 mg/dL — AB (ref 70–99)
GLUCOSE-CAPILLARY: 132 mg/dL — AB (ref 70–99)
Glucose-Capillary: 123 mg/dL — ABNORMAL HIGH (ref 70–99)
Glucose-Capillary: 92 mg/dL (ref 70–99)

## 2013-06-02 LAB — HEMOGLOBIN AND HEMATOCRIT, BLOOD
HCT: 29.6 % — ABNORMAL LOW (ref 36.0–46.0)
HEMOGLOBIN: 10 g/dL — AB (ref 12.0–15.0)

## 2013-06-02 NOTE — Progress Notes (Signed)
Patient Demographics  Robin Snow, is a 73 y.o. female, DOB - 1940/09/14, WUJ:[id number]  Admit date - 05/30/2013   Admitting Physician Eduard ClosArshad N Kakrakandy, MD  Outpatient Primary MD for the patient is Robin Snow,[name], MD  LOS - 3   Chief Complaint  Patient presents with  . Rectal Bleeding        Assessment & Plan    GI bleed - suspect lower GI bleed, BUN/creatinine stable, H&H remain stable, type screen done, continue to hold aspirin switched to oral PPI, on full diet today, GI following. Likely discharge in the morning if H&H remained stable.      Hypertension - on ACE and clonidine, increased hydralazine dose for better control.       Atrial fibrillation - presently rate controlled. Not a anticoagulation candidate, due to #1 above      Diabetes mellitus type 2 - patient's Amaryl and place patient on CBG checks with sliding-scale coverage.  CBG (last 3)   Recent Labs  06/01/13 1704 06/02/13 0002 06/02/13 0602  GLUCAP 97 92 100*       Dyslipidemia continue home dose statin.       Code Status: Full  Family Communication:    Disposition Plan: Home   Procedures     Consults GI   Medications  Scheduled Meds: . cloNIDine  0.1 mg Transdermal Q Mon  . lisinopril  20 mg Oral Daily  . oxybutynin  5 mg Oral Daily  . pantoprazole  40 mg Oral Daily  . polyethylene glycol  17 g Oral TID  . simvastatin  10 mg Oral q1800   Continuous Infusions:   PRN Meds:.acetaminophen, hydrALAZINE, ondansetron (ZOFRAN) IV  DVT Prophylaxis   SCDs    Lab Results  Component Value Date   PLT 149* 05/30/2013    Antibiotics    Anti-infectives   None          Subjective:   Robin HiltsBarbara Snow today has, No headache, No chest pain, No abdominal pain - No  Nausea, No new weakness tingling or numbness, No Cough - SOB.  Not noticed any fresh blood but some dark old blood in stool   Objective:   Filed Vitals:   06/01/13 1706 06/01/13 2024 06/02/13 0559 06/02/13 0900  BP: 138/88 131/88 113/78 118/70  Pulse: 108 89 75 96  Temp: 98.9 F (37.2 C) 98 F (36.7 C) 97.8 F (36.6 C) 97.9 F (36.6 C)  TempSrc:  Oral Oral Oral  Resp: 18 24 20 18   Height:      Weight:      SpO2: 99% 100% 98% 98%    Wt Readings from Last 3 Encounters:  05/31/13 119.659 kg (263 lb 12.8 oz)  01/27/13 108 kg (238 lb 1.6 oz)  01/27/13 108 kg (238 lb 1.6 oz)     Intake/Output Summary (Last 24 hours) at 06/02/13 1037 Last data filed at 06/02/13 0800  Gross per 24 hour  Intake   1080 ml  Output      0 ml  Net   1080 ml     Physical Exam  Awake Alert, Oriented X 3, No new F.N deficits, Normal affect [place].AT,PERRAL Supple Neck,No JVD, No cervical lymphadenopathy appriciated.  Symmetrical Chest wall movement, Good  air movement bilaterally, CTAB RRR,No Gallops,Rubs or new Murmurs, No Parasternal Heave +ve B.Sounds, Abd Soft, Non tender, No organomegaly appriciated, No rebound - guarding or rigidity. No Cyanosis, Clubbing , 1+ chronic leg edema, No new Rash or bruise     Data Review   Micro Results No results found for this or any previous visit (from the past 240 hour(s)).  Radiology Reports No results found.  CBC  Recent Labs Lab 05/29/13 2351 05/30/13 0615 05/30/13 0722 05/30/13 1140  05/31/13 1926 06/01/13 0356 06/01/13 1215 06/01/13 2037 06/02/13 0357  WBC 6.6 6.0 5.7 5.4  --   --   --   --   --   --   HGB 12.0 10.9* 10.3* 10.4*  < > 10.4* 9.8* 11.1* 10.1* 10.0*  HCT 35.8* 32.1* 30.2* 31.2*  < > 30.7* 29.1* 32.4* 29.2* 29.6*  PLT 173 151 154 149*  --   --   --   --   --   --   MCV 85.4 86.1 85.3 86.0  --   --   --   --   --   --   MCH 28.6 29.2 29.1 28.7  --   --   --   --   --   --   MCHC 33.5 34.0 34.1 33.3  --   --   --   --   --    --   RDW 14.9 15.0 14.9 15.1  --   --   --   --   --   --   LYMPHSABS 2.3  --   --   --   --   --   --   --   --   --   MONOABS 0.6  --   --   --   --   --   --   --   --   --   EOSABS 0.2  --   --   --   --   --   --   --   --   --   BASOSABS 0.0  --   --   --   --   --   --   --   --   --   < > = values in this interval not displayed.  Chemistries   Recent Labs Lab 05/29/13 2351 05/30/13 0615  NA 143 146  K 4.3 4.2  CL 103 107  CO2 28 28  GLUCOSE 116* 97  BUN 15 17  CREATININE 1.28* 1.04  CALCIUM 9.4 9.0  AST 13 12  ALT 10 10  ALKPHOS 82 73  BILITOT 0.4 0.4   ------------------------------------------------------------------------------------------------------------------ estimated creatinine clearance is 64.5 ml/min (by C-G formula based on Cr of 1.04). ------------------------------------------------------------------------------------------------------------------ No results found for this basename: HGBA1C,  in the last 72 hours ------------------------------------------------------------------------------------------------------------------ No results found for this basename: CHOL, HDL, LDLCALC, TRIG, CHOLHDL, LDLDIRECT,  in the last 72 hours ------------------------------------------------------------------------------------------------------------------ No results found for this basename: TSH, T4TOTAL, FREET3, T3FREE, THYROIDAB,  in the last 72 hours ------------------------------------------------------------------------------------------------------------------ No results found for this basename: VITAMINB12, FOLATE, FERRITIN, TIBC, IRON, RETICCTPCT,  in the last 72 hours  Coagulation profile  Recent Labs Lab 05/30/13 0250  INR 1.02    No results found for this basename: DDIMER,  in the last 72 hours  Cardiac Enzymes No results found for this basename: CK, CKMB, TROPONINI, MYOGLOBIN,  in the last 168  hours ------------------------------------------------------------------------------------------------------------------ No components found with this basename: POCBNP,  Time Spent in minutes   35   SINGH,PRASHANT K M.D on 06/02/2013 at 10:37 AM  Between 7am to 7pm - Pager - [phone]  After 7pm go to [email or web address] - password [email or web address]  And look for the night coverage person covering for me after hours  [facility] Office  [phone]

## 2013-06-02 NOTE — Progress Notes (Signed)
[facility] PROGRESS NOTE Subjective Pt states ? Old blood, some BRB with wiping. Hg up and down but stable  Objective: Vital signs in last 24 hours: Temp:  [97.8 F (36.6 C)-98.9 F (37.2 C)] 97.8 F (36.6 C) (02/27 0559) Pulse Rate:  [75-108] 75 (02/27 0559) Resp:  [18-24] 20 (02/27 0559) BP: (113-173)/(78-121) 113/78 mmHg (02/27 0559) SpO2:  [98 %-100 %] 98 % (02/27 0559) Last BM Date: 05/31/13  Intake/Output from previous day: 02/26 0701 - 02/27 0700 In: 840 [P.O.:840] Out: -  Intake/Output this shift:    PE: no change   Lab Results:  Recent Labs  05/30/13 1140  05/31/13 1926 06/01/13 0356 06/01/13 1215 06/01/13 2037 06/02/13 0357  WBC 5.4  --   --   --   --   --   --   HGB 10.4*  < > 10.4* 9.8* 11.1* 10.1* 10.0*  HCT 31.2*  < > 30.7* 29.1* 32.4* 29.2* 29.6*  PLT 149*  --   --   --   --   --   --   < > = values in this interval not displayed. BMET No results found for this basename: NA, K, CL, CO2, CREATININE,  in the last 72 hours LFT No results found for this basename: PROT, AST, ALT, ALKPHOS, BILITOT, BILIDIR, IBILI,  in the last 72 hours PT/INR No results found for this basename: LABPROT, INR,  in the last 72 hours PANCREAS No results found for this basename: LIPASE,  in the last 72 hours       Studies/Results: No results found.  Medications: I have reviewed the patient's current medications.  Assessment/Plan: 1. LGI Bleed appears stable. Tolerating diet and will change to Hg in am if stable could discharge and follow up with Dr Dulce Sellarutlaw in several weeks. Dr Madilyn FiremanHayes to see this weekend   Taimur Fier JR,Edwardo Wojnarowski L 06/02/2013, 8:12 AM

## 2013-06-03 LAB — CBC
HCT: 29.7 % — ABNORMAL LOW (ref 36.0–46.0)
HEMOGLOBIN: 9.9 g/dL — AB (ref 12.0–15.0)
MCH: 28.6 pg (ref 26.0–34.0)
MCHC: 33.3 g/dL (ref 30.0–36.0)
MCV: 85.8 fL (ref 78.0–100.0)
Platelets: 181 10*3/uL (ref 150–400)
RBC: 3.46 MIL/uL — ABNORMAL LOW (ref 3.87–5.11)
RDW: 15 % (ref 11.5–15.5)
WBC: 5.1 10*3/uL (ref 4.0–10.5)

## 2013-06-03 LAB — GLUCOSE, CAPILLARY
GLUCOSE-CAPILLARY: 114 mg/dL — AB (ref 70–99)
Glucose-Capillary: 115 mg/dL — ABNORMAL HIGH (ref 70–99)
Glucose-Capillary: 115 mg/dL — ABNORMAL HIGH (ref 70–99)

## 2013-06-03 MED ORDER — DOCUSATE SODIUM 100 MG PO CAPS: 100.0000 mg | ORAL_CAPSULE | Freq: Every day | ORAL | Status: DC

## 2013-06-03 NOTE — Discharge Instructions (Signed)
Follow with Primary MD Burtis JunesBLOUNT,[name], MD in 7 days   Get CBC, CMP, checked 7 days by Primary MD and again as instructed by your Primary MD.     Activity: As tolerated with Full fall precautions use walker/cane & assistance as needed   Disposition Home     Diet: Heart Healthy - Low Carb  For Heart failure patients - Check your Weight same time everyday, if you gain over 2 pounds, or you develop in leg swelling, experience more shortness of breath or chest pain, call your Primary MD immediately. Follow Cardiac Low Salt Diet and 1.8 lit/day fluid restriction.   On your next visit with her primary care physician please Get Medicines reviewed and adjusted.  Please request your Prim.MD to go over all Hospital Tests and Procedure/Radiological results at the follow up, please get all Hospital records sent to your Prim MD by signing hospital release before you go home.   If you experience worsening of your admission symptoms, develop shortness of breath, life threatening emergency, suicidal or homicidal thoughts you must seek medical attention immediately by calling 911 or calling your MD immediately  if symptoms less severe.  You Must read complete instructions/literature along with all the possible adverse reactions/side effects for all the Medicines you take and that have been prescribed to you. Take any new Medicines after you have completely understood and accpet all the possible adverse reactions/side effects.   Do not drive and provide baby sitting services if your were admitted for syncope or siezures until you have seen by Primary MD or a Neurologist and advised to do so again.  Do not drive when taking Pain medications.    Do not take more than prescribed Pain, Sleep and Anxiety Medications  Special Instructions: If you have smoked or chewed Tobacco  in the last 2 yrs please stop smoking, stop any regular Alcohol  and or any Recreational drug use.  Wear Seat belts while  driving.   Please note  You were cared for by a hospitalist during your hospital stay. If you have any questions about your discharge medications or the care you received while you were in the hospital after you are discharged, you can call the unit and asked to speak with the hospitalist on call if the hospitalist that took care of you is not available. Once you are discharged, your primary care physician will handle any further medical issues. Please note that NO REFILLS for any discharge medications will be authorized once you are discharged, as it is imperative that you return to your primary care physician (or establish a relationship with a primary care physician if you do not have one) for your aftercare needs so that they can reassess your need for medications and monitor your lab values.

## 2013-06-03 NOTE — Progress Notes (Signed)
Discharge instructions provided. Pt verbalized understanding of when to f/u with PCP and GI, and where to pick up medication using teach back. NSL discontinued. Catheter intact. Pt escorted with wheelchair and NT.

## 2013-06-03 NOTE — Discharge Summary (Signed)
Robin Snow, is a 73 y.o. female  DOB Apr 02, 1941  MRN [id number].  Admission date:  05/30/2013  Admitting Physician  Eduard Clos, MD  Discharge Date:  06/03/2013   Primary MD  Burtis Junes, MD  Recommendations for primary care physician for things to follow:   Monitor CBC closely   Admission Diagnosis  GI bleed [578.9]   Discharge Diagnosis  GI bleed [578.9]  likely lower GI from diverticular source  Principal Problem:   GI bleed Active Problems:   HYPERTENSION, BENIGN   Atrial fibrillation      Past Medical History  Diagnosis Date  . Diabetes mellitus   . Atrial fibrillation   . Hypertension   . Arthritis   . Coagulopathy     Past Surgical History  Procedure Laterality Date  . Cesarean section    . Replacement total knee bilateral    . Tubal ligation    . Esophagogastroduodenoscopy Left 01/23/2013    Procedure: ESOPHAGOGASTRODUODENOSCOPY (EGD);  Surgeon: Willis Modena, MD;  Location: [facility] ENDOSCOPY;  Service: Endoscopy;  Laterality: Left;  . Colonoscopy Left 01/24/2013    Procedure: COLONOSCOPY;  Surgeon: Willis Modena, MD;  Location: [facility] ENDOSCOPY;  Service: Endoscopy;  Laterality: Left;     Discharge Condition: Stable   Follow UP  Follow-up Information   Follow up with Burtis Junes, MD. Schedule an appointment as soon as possible for a visit in 1 week.   Specialty:  Family Medicine   Contact information:   [address] [address] [place] [address] [phone]       Follow up with Freddy Jaksch, MD. Schedule an appointment as soon as possible for a visit in 2 weeks.   Specialty:  Gastroenterology   Contact information:   [address] [place] [address] [phone]         Discharge Instructions  and  Discharge Medications       Discharge Orders   Future Appointments Provider Department Dept Phone   08/23/2013 8:15 AM Sherrie George, MD [facility] [phone]   Future Orders Complete By Expires   Discharge instructions  As directed    Comments:     Follow with Primary MD Burtis Junes, MD in 7 days   Get CBC, CMP, checked 7 days by Primary MD and again as instructed by your Primary MD.     Activity: As tolerated with Full fall precautions use walker/cane & assistance as needed   Disposition Home     Diet: Heart Healthy - Low Carb  For Heart failure patients - Check your Weight same time everyday, if you gain over 2 pounds, or you develop in leg swelling, experience more shortness of breath or chest pain, call your Primary MD immediately. Follow Cardiac Low Salt Diet and 1.8 lit/day fluid restriction.   On your next visit with her primary care physician please Get Medicines reviewed and adjusted.  Please request your Prim.MD to go over all Hospital Tests and  Procedure/Radiological results at the follow up, please get all Hospital records sent to your Prim MD by signing hospital release before you go home.   If you experience worsening of your admission symptoms, develop shortness of breath, life threatening emergency, suicidal or homicidal thoughts you must seek medical attention immediately by calling 911 or calling your MD immediately  if symptoms less severe.  You Must read complete instructions/literature along with all the possible adverse reactions/side effects for all the Medicines you take and that have been prescribed to you. Take any new Medicines after you have completely understood and accpet all the possible adverse reactions/side effects.   Do not drive and provide baby sitting services if your were admitted for syncope or siezures until you have seen by Primary MD or a Neurologist and advised to do so again.  Do not drive when taking Pain medications.     Do not take more than prescribed Pain, Sleep and Anxiety Medications  Special Instructions: If you have smoked or chewed Tobacco  in the last 2 yrs please stop smoking, stop any regular Alcohol  and or any Recreational drug use.  Wear Seat belts while driving.   Please note  You were cared for by a hospitalist during your hospital stay. If you have any questions about your discharge medications or the care you received while you were in the hospital after you are discharged, you can call the unit and asked to speak with the hospitalist on call if the hospitalist that took care of you is not available. Once you are discharged, your primary care physician will handle any further medical issues. Please note that NO REFILLS for any discharge medications will be authorized once you are discharged, as it is imperative that you return to your primary care physician (or establish a relationship with a primary care physician if you do not have one) for your aftercare needs so that they can reassess your need for medications and monitor your lab values.   Increase activity slowly  As directed        Medication List         acetaminophen 500 MG tablet  Commonly known as:  TYLENOL  Take 500 mg by mouth every 6 (six) hours as needed for mild pain.     aspirin EC 81 MG tablet  Take 81 mg by mouth daily.     cloNIDine 0.1 MG tablet  Commonly known as:  CATAPRES  Take 0.1 mg by mouth 2 (two) times daily.     docusate sodium 100 MG capsule  Commonly known as:  COLACE  Take 1 capsule (100 mg total) by mouth daily.     furosemide 40 MG tablet  Commonly known as:  LASIX  Take 40 mg by mouth daily as needed (swelling).     glimepiride 2 MG tablet  Commonly known as:  AMARYL  Take 2 mg by mouth daily before breakfast.     hydrochlorothiazide 12.5 MG capsule  Commonly known as:  MICROZIDE  Take 12.5 mg by mouth daily.     lisinopril 20 MG tablet  Commonly known as:  PRINIVIL,ZESTRIL   Take 20 mg by mouth daily.     lovastatin 20 MG tablet  Commonly known as:  MEVACOR  Take 20 mg by mouth at bedtime.     oxybutynin 5 MG 24 hr tablet  Commonly known as:  DITROPAN-XL  Take 5 mg by mouth daily.     pantoprazole 40 MG tablet  Commonly known as:  PROTONIX  Take 1 tablet (40 mg total) by mouth daily at 12 noon.     potassium chloride SA 20 MEQ tablet  Commonly known as:  K-DUR,KLOR-CON  Take 20 mEq by mouth daily.          Diet and Activity recommendation: See Discharge Instructions above   Consults obtained - GI Dr. Randa EvensEdwards   Major procedures and Radiology Reports - PLEASE review detailed and final reports for all details, in brief -       No results found.  Micro Results      No results found for this or any previous visit (from the past 240 hour(s)).   History of present illness and  Hospital Course:     Kindly see H&P for history of present illness and admission details, please review complete Labs, Consult reports and Test reports for all details in brief [name], is a 73 y.o. female, patient with history of  hypertension, atrial fibrillation, type 2 diabetes mellitus, mild gastritis and diverticulosis, who was admitted to the hospital for bright red blood per rectum, no nausea vomiting or upper abdominal discomfort. Presentation was suspicious for lower GI bleed caused by a diverticular source.   For her lower GI bleed she was treated conservatively with bowel rest, IV fluids, her H&H remained stable and she did not require any transfusions, she was seen by Dr. Randa EvensEdwards GI what with no further inpatient workup, her diet was gradually advanced and now tolerating regular diet without any discomfort or problems. No further episodes of lower GI bleed. Will be discharged on home medications, we'll resume low-dose aspirin with continued use of PPI. He will follow with primary care physician Dr. Dulce Sellaroutlaw and her PCP within a week  postdischarge.   Her chronic medical issues which include atrial fibrillation, diabetes mellitus, hypertension all stable she will commence her home medications unchanged. She is a poor candidate for anticoagulation due to her GI issues and issues with GI bleeding.      Today   Subjective:   Robin HiltsBarbara Snow today has no headache,no chest abdominal pain,no new weakness tingling or numbness, feels much better wants to go home today.     Objective:   Blood pressure 125/64, pulse 75, temperature 98.2 F (36.8 C), temperature source Oral, resp. rate 16, height 5\' 7"  (1.702 m), weight 119.659 kg (263 lb 12.8 oz), SpO2 98.00%.   Intake/Output Summary (Last 24 hours) at 06/03/13 0819 Last data filed at 06/03/13 0700  Gross per 24 hour  Intake   1200 ml  Output      0 ml  Net   1200 ml    Exam Awake Alert, Oriented *3, No new F.N deficits, Normal affect [place].AT,PERRAL Supple Neck,No JVD, No cervical lymphadenopathy appriciated.  Symmetrical Chest wall movement, Good air movement bilaterally, CTAB RRR,No Gallops,Rubs or new Murmurs, No Parasternal Heave +ve B.Sounds, Abd Soft, Non tender, No organomegaly appriciated, No rebound -guarding or rigidity. No Cyanosis, Clubbing or edema, No new Rash or bruise  Data Review   CBC w Diff: Lab Results  Component Value Date   WBC 5.1 06/03/2013   HGB 9.9* 06/03/2013   HCT 29.7* 06/03/2013   PLT 181 06/03/2013   LYMPHOPCT 36 05/29/2013   MONOPCT 9 05/29/2013   EOSPCT 3 05/29/2013   BASOPCT 0 05/29/2013    CMP: Lab Results  Component Value Date   NA 146 05/30/2013   K 4.2 05/30/2013   CL 107 05/30/2013  CO2 28 05/30/2013   BUN 17 05/30/2013   CREATININE 1.04 05/30/2013   PROT 6.7 05/30/2013   ALBUMIN 3.1* 05/30/2013   BILITOT 0.4 05/30/2013   ALKPHOS 73 05/30/2013   AST 12 05/30/2013   ALT 10 05/30/2013  .   Total Time in preparing paper work, data evaluation and todays exam - 35 minutes  Leroy Sea M.D on 06/03/2013 at 8:19 AM  [facility] Office  [phone]

## 2013-06-13 ENCOUNTER — Other Ambulatory Visit (HOSPITAL_COMMUNITY): Payer: Self-pay | Admitting: Orthopedic Surgery

## 2013-06-13 DIAGNOSIS — M25561 Pain in right knee: Secondary | ICD-10-CM

## 2013-06-20 ENCOUNTER — Encounter (HOSPITAL_COMMUNITY)
Admission: RE | Admit: 2013-06-20 | Discharge: 2013-06-20 | Disposition: A | Discharge status: Home / Self Care | Payer: Medicare Other | Source: Ambulatory Visit | Attending: Orthopedic Surgery | Admitting: Orthopedic Surgery

## 2013-06-20 DIAGNOSIS — M25561 Pain in right knee: Secondary | ICD-10-CM

## 2013-06-20 DIAGNOSIS — M25569 Pain in unspecified knee: Secondary | ICD-10-CM | POA: Insufficient documentation

## 2013-06-20 MED ORDER — TECHNETIUM TC 99M MEDRONATE IV KIT
27.5000 | PACK | Freq: Once | INTRAVENOUS | Status: AC | PRN
  Administered 2013-06-20: 27.5 via INTRAVENOUS

## 2013-07-07 ENCOUNTER — Encounter (HOSPITAL_COMMUNITY): Payer: Self-pay | Admitting: Anesthesiology

## 2013-07-07 ENCOUNTER — Encounter (HOSPITAL_COMMUNITY): Payer: Self-pay | Admitting: *Deleted

## 2013-07-07 ENCOUNTER — Other Ambulatory Visit: Payer: Self-pay | Admitting: Orthopedic Surgery

## 2013-07-07 ENCOUNTER — Encounter (HOSPITAL_COMMUNITY): Payer: Self-pay | Admitting: Pharmacy Technician

## 2013-07-08 ENCOUNTER — Ambulatory Visit (HOSPITAL_COMMUNITY): Admission: RE | Admit: 2013-07-08 | Payer: Medicare Other | Source: Ambulatory Visit | Admitting: Orthopedic Surgery

## 2013-07-08 HISTORY — DX: Personal history of other medical treatment: Z92.89

## 2013-07-08 HISTORY — DX: Gastrointestinal hemorrhage, unspecified: K92.2

## 2013-07-08 HISTORY — DX: Anemia, unspecified: D64.9

## 2013-07-08 HISTORY — DX: Gastro-esophageal reflux disease without esophagitis: K21.9

## 2013-07-08 SURGERY — IRRIGATION AND DEBRIDEMENT KNEE WITH POLY EXCHANGE
Anesthesia: Regional | Laterality: Right

## 2013-07-14 ENCOUNTER — Other Ambulatory Visit: Payer: Self-pay | Admitting: Family Medicine

## 2013-07-18 ENCOUNTER — Other Ambulatory Visit (HOSPITAL_COMMUNITY): Payer: Self-pay | Admitting: Internal Medicine

## 2013-08-01 DIAGNOSIS — Z96659 Presence of unspecified artificial knee joint: Secondary | ICD-10-CM

## 2013-08-01 DIAGNOSIS — T8484XA Pain due to internal orthopedic prosthetic devices, implants and grafts, initial encounter: Secondary | ICD-10-CM | POA: Insufficient documentation

## 2013-08-17 ENCOUNTER — Encounter: Payer: Self-pay | Admitting: Family Medicine

## 2013-08-23 ENCOUNTER — Ambulatory Visit (INDEPENDENT_AMBULATORY_CARE_PROVIDER_SITE_OTHER): Payer: Medicare Other | Admitting: Ophthalmology

## 2013-09-07 DIAGNOSIS — G894 Chronic pain syndrome: Secondary | ICD-10-CM | POA: Insufficient documentation

## 2013-11-13 ENCOUNTER — Encounter (HOSPITAL_COMMUNITY): Payer: Self-pay | Admitting: Emergency Medicine

## 2013-11-13 ENCOUNTER — Emergency Department (HOSPITAL_COMMUNITY): Payer: Medicare Other

## 2013-11-13 ENCOUNTER — Inpatient Hospital Stay (HOSPITAL_COMMUNITY)
Admission: EM | Admit: 2013-11-13 | Discharge: 2013-11-16 | DRG: 309 | Disposition: A | Discharge status: Home Health | Payer: Medicare Other | Attending: Internal Medicine | Admitting: Internal Medicine

## 2013-11-13 ENCOUNTER — Observation Stay (HOSPITAL_COMMUNITY): Payer: Medicare Other

## 2013-11-13 DIAGNOSIS — R079 Chest pain, unspecified: Secondary | ICD-10-CM

## 2013-11-13 DIAGNOSIS — E119 Type 2 diabetes mellitus without complications: Secondary | ICD-10-CM | POA: Diagnosis present

## 2013-11-13 DIAGNOSIS — I509 Heart failure, unspecified: Secondary | ICD-10-CM

## 2013-11-13 DIAGNOSIS — Z79899 Other long term (current) drug therapy: Secondary | ICD-10-CM

## 2013-11-13 DIAGNOSIS — Z96659 Presence of unspecified artificial knee joint: Secondary | ICD-10-CM

## 2013-11-13 DIAGNOSIS — K219 Gastro-esophageal reflux disease without esophagitis: Secondary | ICD-10-CM | POA: Diagnosis present

## 2013-11-13 DIAGNOSIS — Z888 Allergy status to other drugs, medicaments and biological substances status: Secondary | ICD-10-CM | POA: Diagnosis not present

## 2013-11-13 DIAGNOSIS — Z886 Allergy status to analgesic agent status: Secondary | ICD-10-CM

## 2013-11-13 DIAGNOSIS — R072 Precordial pain: Secondary | ICD-10-CM

## 2013-11-13 DIAGNOSIS — I10 Essential (primary) hypertension: Secondary | ICD-10-CM | POA: Diagnosis present

## 2013-11-13 DIAGNOSIS — Z7982 Long term (current) use of aspirin: Secondary | ICD-10-CM | POA: Diagnosis not present

## 2013-11-13 DIAGNOSIS — I482 Chronic atrial fibrillation, unspecified: Secondary | ICD-10-CM

## 2013-11-13 DIAGNOSIS — M79604 Pain in right leg: Secondary | ICD-10-CM | POA: Diagnosis present

## 2013-11-13 DIAGNOSIS — K922 Gastrointestinal hemorrhage, unspecified: Secondary | ICD-10-CM

## 2013-11-13 DIAGNOSIS — Z9851 Tubal ligation status: Secondary | ICD-10-CM

## 2013-11-13 DIAGNOSIS — I4891 Unspecified atrial fibrillation: Principal | ICD-10-CM | POA: Diagnosis present

## 2013-11-13 DIAGNOSIS — M109 Gout, unspecified: Secondary | ICD-10-CM | POA: Diagnosis present

## 2013-11-13 DIAGNOSIS — Z87891 Personal history of nicotine dependence: Secondary | ICD-10-CM

## 2013-11-13 DIAGNOSIS — M25569 Pain in unspecified knee: Secondary | ICD-10-CM | POA: Diagnosis present

## 2013-11-13 DIAGNOSIS — I5032 Chronic diastolic (congestive) heart failure: Secondary | ICD-10-CM | POA: Diagnosis present

## 2013-11-13 DIAGNOSIS — R002 Palpitations: Secondary | ICD-10-CM | POA: Diagnosis present

## 2013-11-13 DIAGNOSIS — E785 Hyperlipidemia, unspecified: Secondary | ICD-10-CM | POA: Diagnosis present

## 2013-11-13 DIAGNOSIS — M25561 Pain in right knee: Secondary | ICD-10-CM

## 2013-11-13 HISTORY — DX: Diverticulosis of intestine, part unspecified, without perforation or abscess without bleeding: K57.90

## 2013-11-13 LAB — CREATININE, SERUM
Creatinine, Ser: 0.93 mg/dL (ref 0.50–1.10)
GFR calc Af Amer: 68 mL/min — ABNORMAL LOW (ref >90)
GFR, EST NON AFRICAN AMERICAN: 59 mL/min — AB (ref >90)

## 2013-11-13 LAB — BASIC METABOLIC PANEL
Anion gap: 16 — ABNORMAL HIGH (ref 5–15)
BUN: 12 mg/dL (ref 6–23)
CHLORIDE: 102 meq/L (ref 96–112)
CO2: 26 mEq/L (ref 19–32)
CREATININE: 1.01 mg/dL (ref 0.50–1.10)
Calcium: 9.7 mg/dL (ref 8.4–10.5)
GFR calc Af Amer: 62 mL/min — ABNORMAL LOW (ref >90)
GFR calc non Af Amer: 53 mL/min — ABNORMAL LOW (ref >90)
Glucose, Bld: 120 mg/dL — ABNORMAL HIGH (ref 70–99)
Potassium: 4.1 mEq/L (ref 3.7–5.3)
Sodium: 144 mEq/L (ref 137–147)

## 2013-11-13 LAB — CBC
HEMATOCRIT: 31.2 % — AB (ref 36.0–46.0)
HEMOGLOBIN: 10.4 g/dL — AB (ref 12.0–15.0)
MCH: 28.1 pg (ref 26.0–34.0)
MCHC: 33.3 g/dL (ref 30.0–36.0)
MCV: 84.3 fL (ref 78.0–100.0)
Platelets: 271 10*3/uL (ref 150–400)
RBC: 3.7 MIL/uL — ABNORMAL LOW (ref 3.87–5.11)
RDW: 15.3 % (ref 11.5–15.5)
WBC: 8.1 10*3/uL (ref 4.0–10.5)

## 2013-11-13 LAB — CBC WITH DIFFERENTIAL/PLATELET
BASOS PCT: 0 % (ref 0–1)
Basophils Absolute: 0 10*3/uL (ref 0.0–0.1)
EOS ABS: 0.1 10*3/uL (ref 0.0–0.7)
EOS PCT: 1 % (ref 0–5)
HEMATOCRIT: 31.9 % — AB (ref 36.0–46.0)
HEMOGLOBIN: 10.4 g/dL — AB (ref 12.0–15.0)
Lymphocytes Relative: 26 % (ref 12–46)
Lymphs Abs: 1.9 10*3/uL (ref 0.7–4.0)
MCH: 27.5 pg (ref 26.0–34.0)
MCHC: 32.6 g/dL (ref 30.0–36.0)
MCV: 84.4 fL (ref 78.0–100.0)
MONOS PCT: 10 % (ref 3–12)
Monocytes Absolute: 0.7 10*3/uL (ref 0.1–1.0)
Neutro Abs: 4.8 10*3/uL (ref 1.7–7.7)
Neutrophils Relative %: 63 % (ref 43–77)
Platelets: 214 10*3/uL (ref 150–400)
RBC: 3.78 MIL/uL — ABNORMAL LOW (ref 3.87–5.11)
RDW: 15.4 % (ref 11.5–15.5)
WBC: 7.5 10*3/uL (ref 4.0–10.5)

## 2013-11-13 LAB — TROPONIN I
Troponin I: <0.3 ng/mL (ref <0.30)
Troponin I: <0.3 ng/mL (ref <0.30)

## 2013-11-13 LAB — MRSA PCR SCREENING: MRSA BY PCR: NEGATIVE

## 2013-11-13 LAB — TSH: TSH: 0.938 u[IU]/mL (ref 0.350–4.500)

## 2013-11-13 LAB — PRO B NATRIURETIC PEPTIDE: Pro B Natriuretic peptide (BNP): 683.9 pg/mL — ABNORMAL HIGH (ref 0–125)

## 2013-11-13 MED ORDER — DILTIAZEM HCL ER COATED BEADS 180 MG PO CP24
180.0000 mg | ORAL_CAPSULE | Freq: Every day | ORAL | Status: DC
  Administered 2013-11-14 – 2013-11-16 (×3): 180 mg via ORAL
  Filled 2013-11-13 (×3): qty 1

## 2013-11-13 MED ORDER — ENOXAPARIN SODIUM 40 MG/0.4ML ~~LOC~~ SOLN
40.0000 mg | SUBCUTANEOUS | Status: DC
  Administered 2013-11-13 – 2013-11-15 (×3): 40 mg via SUBCUTANEOUS
  Filled 2013-11-13 (×4): qty 0.4

## 2013-11-13 MED ORDER — ACETAMINOPHEN 325 MG PO TABS
650.0000 mg | ORAL_TABLET | Freq: Four times a day (QID) | ORAL | Status: DC | PRN
  Administered 2013-11-13 – 2013-11-15 (×4): 650 mg via ORAL
  Filled 2013-11-13 (×4): qty 2

## 2013-11-13 MED ORDER — SODIUM CHLORIDE 0.9 % IV SOLN
INTRAVENOUS | Status: AC
  Administered 2013-11-13: 21:00:00 via INTRAVENOUS

## 2013-11-13 MED ORDER — LEVALBUTEROL HCL 0.63 MG/3ML IN NEBU
0.6300 mg | INHALATION_SOLUTION | Freq: Four times a day (QID) | RESPIRATORY_TRACT | Status: DC | PRN
  Filled 2013-11-13: qty 3

## 2013-11-13 MED ORDER — DILTIAZEM LOAD VIA INFUSION
10.0000 mg | Freq: Once | INTRAVENOUS | Status: AC
  Administered 2013-11-13: 10 mg via INTRAVENOUS
  Filled 2013-11-13: qty 10

## 2013-11-13 MED ORDER — POTASSIUM CHLORIDE CRYS ER 20 MEQ PO TBCR
20.0000 meq | EXTENDED_RELEASE_TABLET | Freq: Every day | ORAL | Status: DC
  Administered 2013-11-13: 20 meq via ORAL
  Filled 2013-11-13 (×3): qty 1

## 2013-11-13 MED ORDER — POTASSIUM CHLORIDE CRYS ER 20 MEQ PO TBCR
20.0000 meq | EXTENDED_RELEASE_TABLET | Freq: Every day | ORAL | Status: DC
  Administered 2013-11-14 – 2013-11-16 (×3): 20 meq via ORAL
  Filled 2013-11-13 (×3): qty 1

## 2013-11-13 MED ORDER — ONDANSETRON HCL 4 MG PO TABS: 4.0000 mg | ORAL_TABLET | Freq: Four times a day (QID) | ORAL | Status: DC | PRN

## 2013-11-13 MED ORDER — PANTOPRAZOLE SODIUM 40 MG PO TBEC
40.0000 mg | DELAYED_RELEASE_TABLET | Freq: Every day | ORAL | Status: DC
  Administered 2013-11-13 – 2013-11-16 (×4): 40 mg via ORAL
  Filled 2013-11-13 (×3): qty 1

## 2013-11-13 MED ORDER — ASPIRIN EC 81 MG PO TBEC
81.0000 mg | DELAYED_RELEASE_TABLET | Freq: Every day | ORAL | Status: DC
  Administered 2013-11-13 – 2013-11-16 (×4): 81 mg via ORAL
  Filled 2013-11-13 (×4): qty 1

## 2013-11-13 MED ORDER — ONDANSETRON HCL 4 MG/2ML IJ SOLN
4.0000 mg | Freq: Four times a day (QID) | INTRAMUSCULAR | Status: DC | PRN
Start: 2013-11-13 — End: 2013-11-16

## 2013-11-13 MED ORDER — ACETAMINOPHEN 650 MG RE SUPP: 650.0000 mg | Freq: Four times a day (QID) | RECTAL | Status: DC | PRN

## 2013-11-13 MED ORDER — DILTIAZEM HCL ER COATED BEADS 180 MG PO CP24
180.0000 mg | ORAL_CAPSULE | ORAL | Status: AC
  Administered 2013-11-13: 180 mg via ORAL
  Filled 2013-11-13: qty 1

## 2013-11-13 MED ORDER — OXYBUTYNIN CHLORIDE ER 5 MG PO TB24
5.0000 mg | ORAL_TABLET | Freq: Every day | ORAL | Status: DC
  Administered 2013-11-13 – 2013-11-16 (×4): 5 mg via ORAL
  Filled 2013-11-13 (×4): qty 1

## 2013-11-13 MED ORDER — DILTIAZEM HCL 100 MG IV SOLR
5.0000 mg/h | INTRAVENOUS | Status: DC
  Administered 2013-11-13: 5 mg/h via INTRAVENOUS

## 2013-11-13 MED ORDER — CLONIDINE HCL 0.1 MG PO TABS
0.1000 mg | ORAL_TABLET | Freq: Two times a day (BID) | ORAL | Status: DC
  Administered 2013-11-13 – 2013-11-16 (×6): 0.1 mg via ORAL
  Filled 2013-11-13 (×7): qty 1

## 2013-11-13 MED ORDER — FUROSEMIDE 40 MG PO TABS
40.0000 mg | ORAL_TABLET | Freq: Every day | ORAL | Status: DC
  Administered 2013-11-14 – 2013-11-16 (×3): 40 mg via ORAL
  Filled 2013-11-13 (×3): qty 1

## 2013-11-13 MED ORDER — SIMVASTATIN 5 MG PO TABS
5.0000 mg | ORAL_TABLET | Freq: Every day | ORAL | Status: DC
  Administered 2013-11-13 – 2013-11-15 (×3): 5 mg via ORAL
  Filled 2013-11-13 (×4): qty 1

## 2013-11-13 MED ORDER — FUROSEMIDE 40 MG PO TABS
40.0000 mg | ORAL_TABLET | Freq: Every day | ORAL | Status: DC | PRN
  Filled 2013-11-13: qty 1

## 2013-11-13 MED ORDER — GLIMEPIRIDE 2 MG PO TABS
2.0000 mg | ORAL_TABLET | Freq: Every day | ORAL | Status: DC
  Administered 2013-11-14 – 2013-11-16 (×3): 2 mg via ORAL
  Filled 2013-11-13 (×4): qty 1

## 2013-11-13 MED ORDER — POTASSIUM CHLORIDE 20 MEQ PO PACK: 20.0000 meq | PACK | Freq: Every day | ORAL | Status: DC

## 2013-11-13 MED ORDER — DILTIAZEM HCL 30 MG PO TABS: 30.0000 mg | ORAL_TABLET | Freq: Four times a day (QID) | ORAL | Status: DC

## 2013-11-13 NOTE — ED Notes (Signed)
Was seen at dr office today had ekg and was told to come to er for further tests because she had irreg heart beat pt has hx of afib

## 2013-11-13 NOTE — H&P (Addendum)
[facility] History and Physical  Robin Snow ZOX:[id number] DOB: [birth year] DOA: 11/13/2013  Referring physician:  PCP: Burtis JunesBLOUNT,[name], MD   Chief Complaint: Atrial fibrillation with RVR HPI:   73 year old African American female with past medical history of atrial fibrillation, hypertension, diabetes, history of diverticulosis, history of anemia secondary to GI bleed requiring transfusion. Patient had a negative cardiac catheterization in 2012 which revealed normal coronaries. She has not followed up with any cardiologist since 2012. Surprisingly, she has not been on any AV nodal blocking drug. Per patient, she has no prior knowledge of adverse reaction to any AV nodal blocking drug either. She states her heart rate has always been under control. She was previously taking Coumadin for several years. However she had significant GI bleed and was admitted in October 2014. She underwent endoscopy and colonoscopy at the time by Dr. Dulce Sellarutlaw which revealed diffuse diverticulosis and slight gastritis. GI bleed scan showed active GI bleeding in the transverse colon at the time, however this was not seen on colonoscopy. This led to mesenteric angiogram by interventional radiology, however at that point, her GI bleed has stopped and no active GI bleeding source was identified. Patient returned in February 2015 with recurrent GI bleed requiring transfusion. She was noted to have atrial fibrillation in the time however rate controlled. Cardiology service were not involved in either of the previous hospitalization. She underwent repeat colonoscopy in April 2015 which did not reveal any significant bleeding source. According to the patient, she was previously active prior to February 2015. However in the past several month, she has been having significant right knee pain prohibiting her from any strenuous activity. She denies any recent lower extremity edema, orthopnea or paroxysmal nocturnal dyspnea.  She does take 40 mg PRN Lasix at home. She states if her ankle becomes swollen, she would often take a entire week of Lasix. She denies any recent fever, chill, or cough.  She presented her PCPs office in the morning of 11/13/2013. While at the PCPs office, she was noted to have an irregular heart rate. EKG was obtained which showed atrial fibrillation with RVR with heart rate in the 120s. [facility]. Initial EKG showed atrial fibrillation with heart rate in the 150s. Significant laboratory finding include Cr of 1.01, negative troponin x2, proBNP of 683.9, hemoglobin of 10.4 and glucose of 120. Chest x-ray showed minimal vascular congestion. Internal medicine was consulted to admit the patient. IV diltiazem was started which decreased patient's heart rate down to the 90s to 100 range. Cardiology was consulted for atrial fibrillation with RVR and direction of anticoagulation therapy.       Review of Systems: negative for the following  Constitutional: Denies fever, chills, diaphoresis, appetite change and fatigue.  HEENT: Denies photophobia, eye pain, redness, hearing loss, ear pain, congestion, sore throat, rhinorrhea, sneezing, mouth sores, trouble swallowing, neck pain, neck stiffness and tinnitus.  Respiratory: Denies SOB, DOE, cough, chest tightness, and wheezing.  Cardiovascular: Denies chest pain, palpitations and leg swelling.  Gastrointestinal: Denies nausea, vomiting, abdominal pain, diarrhea, constipation, blood in stool and abdominal distention.  Genitourinary: Denies dysuria, urgency, frequency, hematuria, flank pain and difficulty urinating.  Musculoskeletal: Denies myalgias, back pain, knee pain arthralgias and gait problem.  Skin: Denies pallor, rash and wound.  Neurological: Denies dizziness, seizures, syncope, weakness, light-headedness, numbness and headaches.  Hematological: Denies adenopathy. Easy bruising, personal or family bleeding  history  Psychiatric/Behavioral: Denies suicidal ideation, mood changes, confusion, nervousness, sleep  disturbance and agitation       Past Medical History  Diagnosis Date  . Atrial fibrillation   . Hypertension   . Arthritis   . Coagulopathy   . Diabetes mellitus     Type 2  . GI bleed   . Pneumonia     2-3 years ago  . GERD (gastroesophageal reflux disease)   . Anemia   . Hx of transfusion of packed red blood cells   . Diverticulosis   . Anticoagulant drug declined     due to hx GI bleeding     Past Surgical History  Procedure Laterality Date  . Cesarean section    . Replacement total knee bilateral    . Tubal ligation    . Esophagogastroduodenoscopy Left 01/23/2013    Procedure: ESOPHAGOGASTRODUODENOSCOPY (EGD);  Surgeon: Willis Modena, MD;  Location: [facility] ENDOSCOPY;  Service: Endoscopy;  Laterality: Left;  . Colonoscopy Left 01/24/2013    Procedure: COLONOSCOPY;  Surgeon: Willis Modena, MD;  Location: [facility] ENDOSCOPY;  Service: Endoscopy;  Laterality: Left;  [name] Eye surgery Bilateral     cataract surgery  . Joint replacement        Social History:  reports that she quit smoking about 20 years ago. Her smoking use included Cigarettes. She smoked 0.00 packs per day. She has never used smokeless tobacco. She reports that she does not drink alcohol or use illicit drugs.    Allergies  Allergen Reactions  . Aspirin Hives  . Hydrocodone Hives  . Rofecoxib Hives    Family History  Problem Relation Age of Onset  . Cancer Father      Prior to Admission medications   Medication Sig Start Date End Date Taking? Authorizing Provider  acetaminophen (TYLENOL) 500 MG tablet Take 500 mg by mouth every 6 (six) hours as needed for mild pain.   Yes Historical Provider, MD  aspirin EC 81 MG tablet Take 81 mg by mouth daily.   Yes Historical Provider, MD  cloNIDine (CATAPRES) 0.1 MG tablet Take 0.1 mg by mouth 2 (two) times daily.    Yes Historical Provider, MD  diclofenac  sodium (VOLTAREN) 1 % GEL Apply 4 g topically 4 (four) times daily as needed (for pain).   Yes Historical Provider, MD  furosemide (LASIX) 40 MG tablet Take 40 mg by mouth daily as needed (swelling).   Yes Historical Provider, MD  glimepiride (AMARYL) 2 MG tablet Take 2 mg by mouth daily before breakfast.   Yes Historical Provider, MD  hydrochlorothiazide (MICROZIDE) 12.5 MG capsule Take 12.5 mg by mouth daily.   Yes Historical Provider, MD  lisinopril (PRINIVIL,ZESTRIL) 20 MG tablet Take 20 mg by mouth daily.   Yes Historical Provider, MD  lovastatin (MEVACOR) 20 MG tablet Take 20 mg by mouth at bedtime.   Yes Historical Provider, MD  oxybutynin (DITROPAN-XL) 5 MG 24 hr tablet Take 5 mg by mouth daily.   Yes Historical Provider, MD  oxyCODONE-acetaminophen (PERCOCET/ROXICET) 5-325 MG per tablet Take 1-2 tablets by mouth every 4 (four) hours as needed for severe pain (may take 1-2 tabs every 4- 6 hours).   Yes Historical Provider, MD  pantoprazole (PROTONIX) 40 MG tablet Take 40 mg by mouth daily.   Yes Historical Provider, MD  potassium chloride SA (K-DUR,KLOR-CON) 20 MEQ tablet Take 20 mEq by mouth daily.    Yes Historical Provider, MD     Physical Exam: Filed Vitals:   11/13/13 1716 11/13/13 1730 11/13/13 1751 11/13/13 1800  BP:  149/92  160/91 145/79  Pulse:   99   Temp:      Resp:      SpO2: 100% 98% 98% 100%     Constitutional: Vital signs reviewed. Patient is a well-developed and well-nourished in no acute distress and cooperative with exam. Alert and oriented x3.  Head: Normocephalic and atraumatic  Ear: TM normal bilaterally  Mouth: no erythema or exudates, MMM  Eyes: PERRL, EOMI, conjunctivae normal, No scleral icterus.  Neck: Supple, Trachea midline normal ROM, No JVD, mass, thyromegaly, or carotid bruit present.  Cardiovascular: Irregularly irregular, S1 normal, S2 normal, no MRG, pulses symmetric and intact bilaterally  Pulmonary/Chest: CTAB, no wheezes, rales, or rhonchi   Abdominal: Soft. Non-tender, non-distended, bowel sounds are normal, no masses, organomegaly, or guarding present.  GU: no CVA tenderness Musculoskeletal: No joint deformities, erythema, or stiffness, ROM full and no nontender Ext: no edema and no cyanosis, pulses palpable bilaterally (DP and PT)  Hematology: no cervical, inginal, or axillary adenopathy.  Neurological: A&O x3, Strenght is normal and symmetric bilaterally, cranial nerve II-XII are grossly intact, no focal motor deficit, sensory intact to light touch bilaterally.  Skin: Warm, dry and intact. No rash, cyanosis, or clubbing.  Psychiatric: Normal mood and affect. speech and behavior is normal. Judgment and thought content normal. Cognition and memory are normal.       Labs on Admission:    Basic Metabolic Panel:  Recent Labs Lab 11/13/13 1415 11/13/13 1558  NA 144  --   K 4.1  --   CL 102  --   CO2 26  --   GLUCOSE 120*  --   BUN 12  --   CREATININE 1.01 0.93  CALCIUM 9.7  --    Liver Function Tests: No results found for this basename: AST, ALT, ALKPHOS, BILITOT, PROT, ALBUMIN,  in the last 168 hours No results found for this basename: LIPASE, AMYLASE,  in the last 168 hours No results found for this basename: AMMONIA,  in the last 168 hours CBC:  Recent Labs Lab 11/13/13 1415 11/13/13 1558  WBC 7.5 8.1  NEUTROABS 4.8  --   HGB 10.4* 10.4*  HCT 31.9* 31.2*  MCV 84.4 84.3  PLT 214 271   Cardiac Enzymes:  Recent Labs Lab 11/13/13 1415 11/13/13 1600  TROPONINI <0.30 <0.30    BNP (last 3 results)  Recent Labs  11/13/13 1415  PROBNP 683.9*      CBG: No results found for this basename: GLUCAP,  in the last 168 hours  Radiological Exams on Admission: Dg Chest Port 1 View  11/13/2013   CLINICAL DATA:  Irregular heartbeat.  EXAM: PORTABLE CHEST - 1 VIEW  COMPARISON:  05/01/2010 and 02/03/2008 as well as chest CT 05/01/2010  FINDINGS: Lungs are somewhat hypoinflated without consolidation or  effusion. There is mild prominence of the perihilar markings suggesting a mild degree of vascular congestion. Mild to moderate cardiomegaly is present. There are degenerative changes of the spine with mild curvature of the thoracic spine convex to the right unchanged.  IMPRESSION: Mild to moderate cardiomegaly with suggestion of minimal vascular congestion.   Electronically Signed   By: Elberta Fortis M.D.   On: 11/13/2013 14:32    EKG: Independently reviewed. Atrial fibrillation Assessment/Plan Active Problems:   Atrial fibrillation   Atrial fibrillation with rapid ventricular response  Atrial fibrillation, chronic On Cardizem drip for rate control, cardiology has initiated the patient on by mouth Cardizem Has been on digoxin previously but was discontinued because  of bradycardia Is not on anticoagulation because of history of GI bleeding Appreciate cardiology input Check TSH, 2-D echo  Right knee pain Status post TRK x 2. She has right knee effusion and warmth. She has had a lot of pain in her right knee chronically We'll schedule her for arthrocentesis under fluoroscopy Obtained body fluid cell count, culture If  this is positive for septic arthritis please call orthopedics in the morning She has already seen 2 orthopedic surgeons in the outpatient setting as well as one in [facility] She has been ruled out for a septic joint in the past  Diabetes mellitus Continue Amaryl We'll start the patient on sliding scale insulin  Dyslipidemia continue simvastatin  Hypertension continue hydrochlorothiazide  GERD Continue Protonix  Code Status:   full Family Communication: bedside Disposition Plan: admit   Time spent: 70 mins   [facility] Pager [phone]  If 7PM-7AM, please contact night-coverage [email or web address] Password [facility] 11/13/2013, 6:26 PM

## 2013-11-13 NOTE — ED Provider Notes (Signed)
CSN: [id number]     Arrival date & time 11/13/13  1337 History   First MD Initiated Contact with Patient 11/13/13 1358     Chief Complaint  Patient presents with  . Palpitations     HPI Pt was seen at 1405. Per pt, c/o gradual onset and worsening of persistent SOB and pedal edema for the past several weeks. Pt states her symptoms worsen with exertion (ie: walking up steps). States she has been walking shorter and shorter distances due to her symptoms. Denies any change in "I always sleep on 2 pillows."  Pt states she was evaluated by her PMD today who noted her "HR was irregular," and she was sent to the ED for further evaluation. Pt denies CP/palpitations, no cough, no abd pain, no N/V/D, no back pain, no fevers.   PMD: [facility] Past Medical History  Diagnosis Date  . Atrial fibrillation   . Hypertension   . Arthritis   . Coagulopathy   . Diabetes mellitus     Type 2  . GI bleed   . Pneumonia     2-3 years ago  . GERD (gastroesophageal reflux disease)   . Anemia   . Hx of transfusion of packed red blood cells   . Diverticulosis   . Anticoagulant drug declined     due to hx GI bleeding   Past Surgical History  Procedure Laterality Date  . Cesarean section    . Replacement total knee bilateral    . Tubal ligation    . Esophagogastroduodenoscopy Left 01/23/2013    Procedure: ESOPHAGOGASTRODUODENOSCOPY (EGD);  Surgeon: Willis Modena, MD;  Location: [facility] ENDOSCOPY;  Service: Endoscopy;  Laterality: Left;  . Colonoscopy Left 01/24/2013    Procedure: COLONOSCOPY;  Surgeon: Willis Modena, MD;  Location: [facility] ENDOSCOPY;  Service: Endoscopy;  Laterality: Left;  [name] Eye surgery Bilateral     cataract surgery  . Joint replacement     Family History  Problem Relation Age of Onset  . Cancer Father    History  Substance Use Topics  . Smoking status: Former Smoker    Types: Cigarettes    Quit date: 07/07/1993  . Smokeless tobacco: Never Used  . Alcohol Use: No   Comment: no longer drinks alcohol    Review of Systems ROS: Statement: All systems negative except as marked or noted in the HPI; Constitutional: Negative for fever and chills. ; ; Eyes: Negative for eye pain, redness and discharge. ; ; ENMT: Negative for ear pain, hoarseness, nasal congestion, sinus pressure and sore throat. ; ; Cardiovascular: Negative for chest pain, palpitations, diaphoresis, +dyspnea and peripheral edema. ; ; Respiratory: Negative for cough, wheezing and stridor. ; ; Gastrointestinal: Negative for nausea, vomiting, diarrhea, abdominal pain, blood in stool, hematemesis, jaundice and rectal bleeding. . ; ; Genitourinary: Negative for dysuria, flank pain and hematuria. ; ; Musculoskeletal: Negative for back pain and neck pain. Negative for swelling and trauma.; ; Skin: Negative for pruritus, rash, abrasions, blisters, bruising and skin lesion.; ; Neuro: Negative for headache, lightheadedness and neck stiffness. Negative for weakness, altered level of consciousness , altered mental status, extremity weakness, paresthesias, involuntary movement, seizure and syncope.      Allergies  Aspirin; Hydrocodone; and Rofecoxib  Home Medications   Prior to Admission medications   Medication Sig Start Date End Date Taking? Authorizing Provider  acetaminophen (TYLENOL) 500 MG tablet Take 500 mg by mouth every 6 (six) hours as needed for mild pain.   Yes Historical Provider,  MD  aspirin EC 81 MG tablet Take 81 mg by mouth daily.   Yes Historical Provider, MD  cloNIDine (CATAPRES) 0.1 MG tablet Take 0.1 mg by mouth 2 (two) times daily.    Yes Historical Provider, MD  diclofenac sodium (VOLTAREN) 1 % GEL Apply 4 g topically 4 (four) times daily as needed (for pain).   Yes Historical Provider, MD  furosemide (LASIX) 40 MG tablet Take 40 mg by mouth daily as needed (swelling).   Yes Historical Provider, MD  glimepiride (AMARYL) 2 MG tablet Take 2 mg by mouth daily before breakfast.   Yes  Historical Provider, MD  hydrochlorothiazide (MICROZIDE) 12.5 MG capsule Take 12.5 mg by mouth daily.   Yes Historical Provider, MD  lisinopril (PRINIVIL,ZESTRIL) 20 MG tablet Take 20 mg by mouth daily.   Yes Historical Provider, MD  lovastatin (MEVACOR) 20 MG tablet Take 20 mg by mouth at bedtime.   Yes Historical Provider, MD  oxybutynin (DITROPAN-XL) 5 MG 24 hr tablet Take 5 mg by mouth daily.   Yes Historical Provider, MD  oxyCODONE-acetaminophen (PERCOCET/ROXICET) 5-325 MG per tablet Take 1-2 tablets by mouth every 4 (four) hours as needed for severe pain (may take 1-2 tabs every 4- 6 hours).   Yes Historical Provider, MD  pantoprazole (PROTONIX) 40 MG tablet Take 40 mg by mouth daily.   Yes Historical Provider, MD  potassium chloride SA (K-DUR,KLOR-CON) 20 MEQ tablet Take 20 mEq by mouth daily.    Yes Historical Provider, MD   BP 125/93  Pulse 101  Temp(Src) 98.3 F (36.8 C)  Resp 18  SpO2 100% Filed Vitals:   11/13/13 1415 11/13/13 1430 11/13/13 1445 11/13/13 1500  BP: 148/103 139/88 145/80 125/93  Pulse: 141 105 107 101  Temp:      Resp: 18     SpO2: 100% 100% 98% 100%     Physical Exam 1410: Physical examination:  Nursing notes reviewed; Vital signs and O2 SAT reviewed;  Constitutional: Well developed, Well nourished, Well hydrated, In no acute distress; Head:  Normocephalic, atraumatic; Eyes: EOMI, PERRL, No scleral icterus; ENMT: Mouth and pharynx normal, Mucous membranes moist; Neck: Supple, Full range of motion, No lymphadenopathy; Cardiovascular: Tachycardic rate and irregular irregular rhythm, No gallop; Respiratory: Breath sounds coarse & equal bilaterally, No wheezes.  Speaking full sentences with ease, Normal respiratory effort/excursion; Chest: Nontender, Movement normal; Abdomen: Soft, Nontender, Nondistended, Normal bowel sounds; Genitourinary: No CVA tenderness; Extremities: Pulses normal, No tenderness, +2 pedal edema bilat without calf asymmetry.; Neuro: AA&Ox3,  Major CN grossly intact.  Speech clear. No gross focal motor or sensory deficits in extremities.; Skin: Color normal, Warm, Dry.    ED Course  Procedures     EKG Interpretation   Date/Time:  Monday November 13 2013 13:42:25 EDT Ventricular Rate:  155 PR Interval:    QRS Duration: 86 QT Interval:  282 QTC Calculation: 453 R Axis:   -9 Text Interpretation:  Atrial fibrillation with rapid ventricular response  with premature ventricular or aberrantly conducted complexes Left axis  deviation Nonspecific ST and T wave abnormality Abnormal ECG When compared  with ECG of 01/23/2013 Rate faster Confirmed by [facility]  MD, Nicholos Johns  [phone]) on 11/13/2013 2:18:57 PM      MDM  MDM Reviewed: previous chart, nursing note and vitals Reviewed previous: labs and ECG Interpretation: labs, ECG and x-ray Total time providing critical care: 30-74 minutes. This excludes time spent performing separately reportable procedures and services. Consults: admitting MD    CRITICAL CARE  Performed by: Laray AngerMCMANUS,Heena Woodbury M Total critical care time: 40 Critical care time was exclusive of separately billable procedures and treating other patients. Critical care was necessary to treat or prevent imminent or life-threatening deterioration. Critical care was time spent personally by me on the following activities: development of treatment plan with patient and/or surrogate as well as nursing, discussions with consultants, evaluation of patient's response to treatment, examination of patient, obtaining history from patient or surrogate, ordering and performing treatments and interventions, ordering and review of laboratory studies, ordering and review of radiographic studies, pulse oximetry and re-evaluation of patient's condition.  Results for orders placed during the hospital encounter of 11/13/13  BASIC METABOLIC PANEL      Result Value Ref Range   Sodium 144  137 - 147 mEq/L   Potassium 4.1  3.7 - 5.3 mEq/L    Chloride 102  96 - 112 mEq/L   CO2 26  19 - 32 mEq/L   Glucose, Bld 120 (*) 70 - 99 mg/dL   BUN 12  6 - 23 mg/dL   Creatinine, Ser 1.611.01  0.50 - 1.10 mg/dL   Calcium 9.7  8.4 - 09.610.5 mg/dL   GFR calc non Af Amer 53 (*) >90 mL/min   GFR calc Af Amer 62 (*) >90 mL/min   Anion gap 16 (*) 5 - 15  CBC WITH DIFFERENTIAL      Result Value Ref Range   WBC 7.5  4.0 - 10.5 K/uL   RBC 3.78 (*) 3.87 - 5.11 MIL/uL   Hemoglobin 10.4 (*) 12.0 - 15.0 g/dL   HCT 04.531.9 (*) 40.936.0 - 81.146.0 %   MCV 84.4  78.0 - 100.0 fL   MCH 27.5  26.0 - 34.0 pg   MCHC 32.6  30.0 - 36.0 g/dL   RDW 91.415.4  78.211.5 - 95.615.5 %   Platelets 214  150 - 400 K/uL   Neutrophils Relative % 63  43 - 77 %   Neutro Abs 4.8  1.7 - 7.7 K/uL   Lymphocytes Relative 26  12 - 46 %   Lymphs Abs 1.9  0.7 - 4.0 K/uL   Monocytes Relative 10  3 - 12 %   Monocytes Absolute 0.7  0.1 - 1.0 K/uL   Eosinophils Relative 1  0 - 5 %   Eosinophils Absolute 0.1  0.0 - 0.7 K/uL   Basophils Relative 0  0 - 1 %   Basophils Absolute 0.0  0.0 - 0.1 K/uL  TROPONIN I      Result Value Ref Range   Troponin I <0.30  <0.30 ng/mL  PRO B NATRIURETIC PEPTIDE      Result Value Ref Range   Pro B Natriuretic peptide (BNP) 683.9 (*) 0 - 125 pg/mL   Dg Chest Port 1 View 11/13/2013   CLINICAL DATA:  Irregular heartbeat.  EXAM: PORTABLE CHEST - 1 VIEW  COMPARISON:  05/01/2010 and 02/03/2008 as well as chest CT 05/01/2010  FINDINGS: Lungs are somewhat hypoinflated without consolidation or effusion. There is mild prominence of the perihilar markings suggesting a mild degree of vascular congestion. Mild to moderate cardiomegaly is present. There are degenerative changes of the spine with mild curvature of the thoracic spine convex to the right unchanged.  IMPRESSION: Mild to moderate cardiomegaly with suggestion of minimal vascular congestion.   Electronically Signed   By: Elberta Fortisaniel  Boyle M.D.   On: 11/13/2013 14:32    1415:  Monitor afib with RVR, HR 130-150's:  Will dose IV  cardizem bolus and gtt.   1545:  HR improving to 90-100's, afib continues on monitor. IV cardizem gtt continues. VS remain stable. Dx and testing d/w pt and family.  Questions answered.  Verb understanding, agreeable to admit.  T/C to [facility] Dr. Susie Cassette, case discussed, including:  HPI, pertinent PM/SHx, VS/PE, dx testing, ED course and treatment:  Agreeable to admit, requests to write temporary orders, obtain stepdown bed to team 10.   Samuel Jester, DO 11/16/13 [phone]

## 2013-11-13 NOTE — ED Notes (Signed)
MD at bedside. Dr. McManus. 

## 2013-11-13 NOTE — Consult Note (Signed)
CARDIOLOGY CONSULT NOTE   Patient ID: RAEDEN BELZER MRN: [id number], DOB/AGE: [birth year]   Admit date: 11/13/2013 Date of Consult: 11/13/2013   Primary Physician: Burtis Junes, MD Primary Cardiologist: previous seen by Dr. Clifton James in 2012  Pt. Profile  73 year old [place] American female with past medical history of atrial fibrillation, hypertension, diabetes, history of diverticulosis, history of anemia secondary to GI bleed requiring transfusion present was atrial fibrillation with RVR.  Problem List  Past Medical History  Diagnosis Date  . Atrial fibrillation   . Hypertension   . Arthritis   . Coagulopathy   . Diabetes mellitus     Type 2  . GI bleed   . Pneumonia     2-3 years ago  . GERD (gastroesophageal reflux disease)   . Anemia   . Hx of transfusion of packed red blood cells   . Diverticulosis   . Anticoagulant drug declined     due to hx GI bleeding    Past Surgical History  Procedure Laterality Date  . Cesarean section    . Replacement total knee bilateral    . Tubal ligation    . Esophagogastroduodenoscopy Left 01/23/2013    Procedure: ESOPHAGOGASTRODUODENOSCOPY (EGD);  Surgeon: Willis Modena, MD;  Location: [facility] ENDOSCOPY;  Service: Endoscopy;  Laterality: Left;  . Colonoscopy Left 01/24/2013    Procedure: COLONOSCOPY;  Surgeon: Willis Modena, MD;  Location: [facility] ENDOSCOPY;  Service: Endoscopy;  Laterality: Left;  [name] Eye surgery Bilateral     cataract surgery  . Joint replacement       Allergies  Allergies  Allergen Reactions  . Aspirin Hives  . Hydrocodone Hives  . Rofecoxib Hives    HPI   The patient is a 73 year old African American female with past medical history of atrial fibrillation, hypertension, diabetes, history of diverticulosis, history of anemia secondary to GI bleed requiring transfusion. Patient had a negative cardiac catheterization in 2012 which revealed normal coronaries. She has not followed up with any  cardiologist since 2012. Surprisingly, she has not been on any AV nodal blocking drug. Per patient, she has no prior knowledge of adverse reaction to any AV nodal blocking drug either. She states her heart rate has always been under control. She was previously taking Coumadin for several years. However she had significant GI bleed and was admitted in October 2014. She underwent endoscopy and colonoscopy at the time by Dr. Dulce Sellar which revealed diffuse diverticulosis and slight gastritis. GI bleed scan showed active GI bleeding in the transverse colon at the time, however this was not seen on colonoscopy. This led to mesenteric angiogram by interventional radiology, however at that point, her GI bleed has stopped and no active GI bleeding source was identified. Patient returned in February 2015 with recurrent GI bleed requiring transfusion. She was noted to have atrial fibrillation in the time however rate controlled. Cardiology service were not involved in either of the previous hospitalization. She underwent repeat colonoscopy in April 2015 which did not reveal any significant bleeding source. According to the patient, she was previously active prior to February 2015. However in the past several month, she has been having significant right knee pain prohibiting her from any strenuous activity. She denies any recent lower extremity edema, orthopnea or paroxysmal nocturnal dyspnea. She does take 40 mg PRN Lasix at home. She states if her ankle becomes swollen, she would often take a entire week of Lasix. She denies any recent fever, chill, or cough.  She presented her PCPs office  in the morning of 11/13/2013. While at the PCPs office, she was noted to have an irregular heart rate. EKG was obtained which showed atrial fibrillation with RVR with heart rate in the 120s. She was referred to [facility] for further evaluation. Initial EKG showed atrial fibrillation with heart rate in the 150s. Significant laboratory  finding include Cr of 1.01, negative troponin x2, proBNP of 683.9, hemoglobin of 10.4 and glucose of 120. Chest x-ray showed minimal vascular congestion. Internal medicine was consulted to admit the patient. IV diltiazem was started which decreased patient's heart rate down to the 90s to 100 range. Cardiology was consulted for atrial fibrillation with RVR and direction of anticoagulation therapy.  Inpatient Medications  . diltiazem  30 mg Oral 4 times per day  . enoxaparin (LOVENOX) injection  40 mg Subcutaneous Q24H    Family History Family History  Problem Relation Age of Onset  . Cancer Father      Social History History   Social History  . Marital Status: Widowed    Spouse Name: [name]    Number of Children: [name]  . Years of Education: [name]   Occupational History  . Not on file.   Social History Main Topics  . Smoking status: Former Smoker    Types: Cigarettes    Quit date: 07/07/1993  . Smokeless tobacco: Never Used  . Alcohol Use: No     Comment: no longer drinks alcohol  . Drug Use: No  . Sexual Activity: Not on file   Other Topics Concern  . Not on file   Social History Narrative  . No narrative on file     Review of Systems  General:  No chills, fever, night sweats or weight changes.  Cardiovascular:  No chest pain, edema, orthopnea, palpitations, paroxysmal nocturnal dyspnea. +dyspnea on exertion, however no recent increase Dermatological: No rash, lesions/masses Respiratory: No cough, dyspnea Urologic: No hematuria, dysuria Abdominal:   No nausea, vomiting, diarrhea, bright red blood per rectum, melena, or hematemesis Last GI bleed in Feb, no recent GI bleed Neurologic:  No visual changes, wkns, changes in mental status. All other systems reviewed and are otherwise negative except as noted above.  Physical Exam  Blood pressure 146/98, pulse 101, temperature 98.3 F (36.8 C), resp. rate 18, SpO2 100.00%.  General: Pleasant, NAD Psych: Normal  affect. Neuro: Alert and oriented X 3. Moves all extremities spontaneously. HEENT: Normal  Neck: Supple without bruits. Mild IVD Lungs:  Resp regular and unlabored, mildly decreased, however no wheezing, rale or rhonchi Heart: irregular tachycardic no s3, s4, or murmurs. Abdomen: Soft, non-tender, non-distended, BS + x 4.  Extremities: No clubbing, cyanosis or edema. DP/PT/Radials 2+ and equal bilaterally. R knee markedly warm than L knee  Labs   Recent Labs  11/13/13 1415 11/13/13 1600  TROPONINI <0.30 <0.30   Lab Results  Component Value Date   WBC 8.1 11/13/2013   HGB 10.4* 11/13/2013   HCT 31.2* 11/13/2013   MCV 84.3 11/13/2013   PLT 271 11/13/2013    Recent Labs Lab 11/13/13 1415 11/13/13 1558  NA 144  --   K 4.1  --   CL 102  --   CO2 26  --   BUN 12  --   CREATININE 1.01 0.93  CALCIUM 9.7  --   GLUCOSE 120*  --    Lab Results  Component Value Date   CHOL  Value: 142        ATP III CLASSIFICATION:  <200  mg/dL   Desirable  102-725200-239  mg/dL   Borderline High  >=366>=240    mg/dL   High        4/40/34741/27/2012   HDL 31* 05/02/2010   LDLCALC  Value: 89        Total Cholesterol/HDL:CHD Risk Coronary Heart Disease Risk Table                     Men   Women  1/2 Average Risk   3.4   3.3  Average Risk       5.0   4.4  2 X Average Risk   9.6   7.1  3 X Average Risk  23.4   11.0        Use the calculated Patient Ratio above and the CHD Risk Table to determine the patient's CHD Risk.        ATP III CLASSIFICATION (LDL):  <100     mg/dL   Optimal  259-563100-129  mg/dL   Near or Above                    Optimal  130-159  mg/dL   Borderline  875-643160-189  mg/dL   High  >329>190     mg/dL   Very High 5/18/84161/27/2012   TRIG 111 05/02/2010   Lab Results  Component Value Date   DDIMER 0.30 06/17/2012    Radiology/Studies  Dg Chest Port 1 View  11/13/2013   CLINICAL DATA:  Irregular heartbeat.  EXAM: PORTABLE CHEST - 1 VIEW  COMPARISON:  05/01/2010 and 02/03/2008 as well as chest CT 05/01/2010  FINDINGS: Lungs are  somewhat hypoinflated without consolidation or effusion. There is mild prominence of the perihilar markings suggesting a mild degree of vascular congestion. Mild to moderate cardiomegaly is present. There are degenerative changes of the spine with mild curvature of the thoracic spine convex to the right unchanged.  IMPRESSION: Mild to moderate cardiomegaly with suggestion of minimal vascular congestion.   Electronically Signed   By: Elberta Fortisaniel  Boyle M.D.   On: 11/13/2013 14:32    ECG  Atrial fibrillation with RVR with heart rates in the 150s  ASSESSMENT AND PLAN  1. atrial fibrillation with RVR  - possibly driven by R knee pain  - Not a candidate with systemic anticoagulation due to recurrent GI bleed  - Consider manage with ASA only (however 1 of previous GI bleed was already off coumadin)  - currently on IV dilt, will start on 180mg  24hr PO dilt, stop IV dilt 2 hrs after take PO dilt   2. Chronic diastolic HF  - start 40mg  daily PO lasix with 20meq KCl  3. R knee swelling  - markedly warm than L knee, [facility]  - h/o gout, however symptom has been persistent since Feb  - need to r/o septic joint  4. Hypertension 5. Diabetes 6. history of diverticulosis 7. history of anemia secondary to GI bleed requiring transfusion   Ramond DialSigned, Meng, Hao, PA-C 11/13/2013, 5:01 PM  Patient seen with PA, agree with the above note.    She was seen in PCP's office with atrial fibrillation/RVR.  She is in chronic atrial fibrillation.  She denies anything other than her mild baseline exertional dyspnea and she has no chest pain.  1. Atrial fibrillation: Chronic.  She is in RVR now on diltiazem gtt. She has not been on nodal blockers in the past.  It is possible that pain  from her right knee is driving RVR.   - Diltiazem gtt for now, transition to po diltiazem CD, likely 180 mg daily.  - She is not on anticoagulation given 2 lower GI bleeds in the last year, one after she  was already off coumadin.   2. Chronic diastolic CHF: Very minimally volume overloaded today.  She takes Lasix 40 mg periodically.  I would suggest that she take it daily along with KCl. 20 meq daily.  3.  Right knee pain: Status post TRK x 2.  She has right knee effusion and warmth.  She has had a lot of pain in her right knee chronically.  Pain may be driving her RVR.  Would have ortho involvement, ?need for joint aspiration to assess for septic arthritis.   Marca Ancona 11/13/2013 5:15 PM

## 2013-11-14 ENCOUNTER — Observation Stay (HOSPITAL_COMMUNITY): Payer: Medicare Other

## 2013-11-14 DIAGNOSIS — M25561 Pain in right knee: Secondary | ICD-10-CM | POA: Diagnosis present

## 2013-11-14 DIAGNOSIS — M79604 Pain in right leg: Secondary | ICD-10-CM | POA: Diagnosis present

## 2013-11-14 DIAGNOSIS — I517 Cardiomegaly: Secondary | ICD-10-CM

## 2013-11-14 DIAGNOSIS — I10 Essential (primary) hypertension: Secondary | ICD-10-CM

## 2013-11-14 DIAGNOSIS — M25569 Pain in unspecified knee: Secondary | ICD-10-CM

## 2013-11-14 LAB — SYNOVIAL CELL COUNT + DIFF, W/ CRYSTALS
Crystals, Fluid: NONE SEEN
Eosinophils-Synovial: 0 % (ref 0–1)
Lymphocytes-Synovial Fld: 14 % (ref 0–20)
Monocyte-Macrophage-Synovial Fluid: 3 % — ABNORMAL LOW (ref 50–90)
Neutrophil, Synovial: 83 % — ABNORMAL HIGH (ref 0–25)
WBC, Synovial: 328 /mm3 — ABNORMAL HIGH (ref 0–200)

## 2013-11-14 LAB — COMPREHENSIVE METABOLIC PANEL
ALK PHOS: 78 U/L (ref 39–117)
ALT: 10 U/L (ref 0–35)
ANION GAP: 13 (ref 5–15)
AST: 16 U/L (ref 0–37)
Albumin: 2.8 g/dL — ABNORMAL LOW (ref 3.5–5.2)
BUN: 11 mg/dL (ref 6–23)
CO2: 24 mEq/L (ref 19–32)
Calcium: 9 mg/dL (ref 8.4–10.5)
Chloride: 105 mEq/L (ref 96–112)
Creatinine, Ser: 0.89 mg/dL (ref 0.50–1.10)
GFR calc Af Amer: 72 mL/min — ABNORMAL LOW (ref >90)
GFR calc non Af Amer: 62 mL/min — ABNORMAL LOW (ref >90)
Glucose, Bld: 91 mg/dL (ref 70–99)
POTASSIUM: 4 meq/L (ref 3.7–5.3)
SODIUM: 142 meq/L (ref 137–147)
TOTAL PROTEIN: 7.7 g/dL (ref 6.0–8.3)
Total Bilirubin: 1.2 mg/dL (ref 0.3–1.2)

## 2013-11-14 LAB — CBC
HEMATOCRIT: 28.7 % — AB (ref 36.0–46.0)
HEMOGLOBIN: 9.4 g/dL — AB (ref 12.0–15.0)
MCH: 27.8 pg (ref 26.0–34.0)
MCHC: 32.8 g/dL (ref 30.0–36.0)
MCV: 84.9 fL (ref 78.0–100.0)
Platelets: 241 10*3/uL (ref 150–400)
RBC: 3.38 MIL/uL — ABNORMAL LOW (ref 3.87–5.11)
RDW: 15.6 % — ABNORMAL HIGH (ref 11.5–15.5)
WBC: 6.6 10*3/uL (ref 4.0–10.5)

## 2013-11-14 LAB — TROPONIN I: Troponin I: <0.3 ng/mL (ref <0.30)

## 2013-11-14 LAB — GLUCOSE, CAPILLARY: GLUCOSE-CAPILLARY: 106 mg/dL — AB (ref 70–99)

## 2013-11-14 MED ORDER — IOHEXOL 180 MG/ML  SOLN
10.0000 mL | Freq: Once | INTRAMUSCULAR | Status: AC | PRN
  Administered 2013-11-14: 8 mL via INTRA_ARTICULAR

## 2013-11-14 MED ORDER — GLUCERNA SHAKE PO LIQD
237.0000 mL | Freq: Two times a day (BID) | ORAL | Status: DC
  Administered 2013-11-14 – 2013-11-16 (×4): 237 mL via ORAL

## 2013-11-14 NOTE — Evaluation (Signed)
Occupational Therapy Evaluation Patient Details Name: Robin Snow MRN: [id number] DOB: October 08, 1940 Today's Date: 11/14/2013    History of Present Illness Pt admitted with A-Fib and RVR.  She has a h/o bil. TKA and has been been having Rt. knee pain with swelling and redness.  Rt knee was aspirated 11/14/13.  PMH includes:  HTN DM; h/o anemia due to GI bleed    Clinical Impression   Pt admitted with above. She demonstrates the below listed deficits and will benefit from continued OT to maximize safety and independence with BADLs.  Pt presents to OT with generalized weakness and Rt knee pain which she reports is significantly improved since aspiration earlier today 11/14/13.   She requires min guard assist with BADLs.  She does struggle with LB ADLs due to knee pain and may benefit from AE.  Anticipate good progress.  She has adequate assistance at home when she discharges. HR increased to 143 with activity       Follow Up Recommendations  No OT follow up;Supervision - Intermittent    Equipment Recommendations  None recommended by OT    Recommendations for Other Services       Precautions / Restrictions Precautions Precautions: Fall      Mobility Bed Mobility Overal bed mobility: Modified Independent                Transfers Overall transfer level: Needs assistance   Transfers: Sit to/from Stand;Stand Pivot Transfers Sit to Stand: Min guard Stand pivot transfers: Min guard            Balance Overall balance assessment: Needs assistance Sitting-balance support: Feet supported Sitting balance-Leahy Scale: Normal     Standing balance support: Single extremity supported Standing balance-Leahy Scale: Fair                              ADL Overall ADL's : Needs assistance/impaired Eating/Feeding: Independent   Grooming: Wash/dry hands;Wash/dry face;Oral care;Brushing hair;Min guard;Standing   Upper Body Bathing: Set up;Sitting   Lower Body  Bathing: Min guard;Sit to/from stand   Upper Body Dressing : Set up;Sitting   Lower Body Dressing: Minimal assistance;Sit to/from stand Lower Body Dressing Details (indicate cue type and reason): Pt struggles to access Rt foot Toilet Transfer: Min guard;Ambulation;Comfort height toilet   Toileting- Clothing Manipulation and Hygiene: Min guard;Sit to/from stand       Functional mobility during ADLs: Min guard;Rolling walker General ADL Comments: Pt states she is currently moving better than she was PTA due to knee pain being improved     Vision                     Perception     Praxis      Pertinent Vitals/Pain Pain Assessment: 0-10 Pain Score: 4  Pain Location: Rt knee Pain Descriptors / Indicators: Aching Pain Intervention(s): Monitored during session (Pt reports pain is better than it has been in a good while)     Hand Dominance Right   Extremity/Trunk Assessment Upper Extremity Assessment Upper Extremity Assessment: Overall WFL for tasks assessed   Lower Extremity Assessment Lower Extremity Assessment: Defer to PT evaluation   Cervical / Trunk Assessment Cervical / Trunk Assessment: Normal   Communication Communication Communication: No difficulties   Cognition Arousal/Alertness: Awake/alert Behavior During Therapy: WFL for tasks assessed/performed Overall Cognitive Status: Within Functional Limits for tasks assessed  General Comments       Exercises       Shoulder Instructions      Home Living Family/patient expects to be discharged to:: Private residence Living Arrangements: Children (and grandchildren) Available Help at Discharge: Family;Available 24 hours/day Type of Home: House Home Access: Stairs to enter [employer] of Steps: 3 Entrance Stairs-Rails: None Home Layout: Two level;Able to live on main level with bedroom/bathroom Alternate Level Stairs-Number of Steps: full flight   Bathroom  Shower/Tub: Tub/shower unit;Curtain Shower/tub characteristics: Engineer, building services: Standard Bathroom Accessibility: Yes How Accessible: Accessible via walker Home Equipment: Walker - 2 wheels;Shower seat;Toilet riser;Wheelchair - manual;Hospital bed (much of her DME was from her daughter who had MS and expired)   Additional Comments: Does not use hospital bed - it was her daughter's who expired.  Pt's daughter, son and grandchildren live with her.       Prior Functioning/Environment Level of Independence: Independent with assistive device(s)        Comments: Pt ambulated short distances with RW.  She was limited by pain.  She was able to perform BADLs, but when pain was higher she had increased difficulty with Rt knee flexion limiting her ability to perform LB ADLs.  During those times, family assisted    OT Diagnosis: Generalized weakness;Acute pain   OT Problem List: Decreased strength;Decreased activity tolerance;Impaired balance (sitting and/or standing);Decreased knowledge of use of DME or AE;Pain   OT Treatment/Interventions: Self-care/ADL training;DME and/or AE instruction;Patient/family education;Balance training    OT Goals(Current goals can be found in the care plan section) Acute Rehab OT Goals Patient Stated Goal: to go home OT Goal Formulation: With patient Time For Goal Achievement: 11/28/13 Potential to Achieve Goals: Good ADL Goals Pt Will Perform Grooming: with modified independence;standing Pt Will Perform Lower Body Bathing: with modified independence;sit to/from stand Pt Will Perform Lower Body Dressing: with modified independence;sit to/from stand;with adaptive equipment Pt Will Transfer to Toilet: with modified independence;ambulating;regular height toilet;bedside commode;grab bars Pt Will Perform Toileting - Clothing Manipulation and hygiene: with modified independence;sit to/from stand Pt Will Perform Tub/Shower Transfer: Tub transfer;with min guard  assist;rolling walker;shower seat  OT Frequency: Min 2X/week   Barriers to D/C:            Co-evaluation              End of Session Equipment Utilized During Treatment: Engineer, water Communication: Mobility status  Activity Tolerance: Patient tolerated treatment well Patient left: in chair;with call bell/phone within reach   Time: 1600-1629 OT Time Calculation (min): 29 min Charges:  OT General Charges $OT Visit: 1 Procedure OT Evaluation $Initial OT Evaluation Tier I: 1 Procedure OT Treatments $Self Care/Home Management : 8-22 mins $Therapeutic Activity: 8-22 mins G-Codes:    Karver Fadden M 12/07/2013, 5:00 PM

## 2013-11-14 NOTE — Progress Notes (Signed)
DAILY PROGRESS NOTE  Subjective:  In good spirits today. Knee pain, s/p tap today. Results pending. HR is well controlled in the 70's and 80's. Right knee tap looks inflammatory with bloody appearance, no crystals, numerous WBC and RBC's.  Objective:  Temp:  [98.4 F (36.9 C)-98.8 F (37.1 C)] 98.8 F (37.1 C) (08/11 1313) Pulse Rate:  [72-141] 89 (08/11 1313) Resp:  [9-18] 14 (08/11 1313) BP: (105-160)/(51-103) 122/94 mmHg (08/11 1313) SpO2:  [97 %-100 %] 97 % (08/11 1313) Weight:  [231 lb 7.7 oz (105 kg)] 231 lb 7.7 oz (105 kg) (08/10 1945) Weight change:   Intake/Output from previous day: 08/10 0701 - 08/11 0700 In: 678.8 [I.V.:678.8] Out: 600 [Urine:600]  Intake/Output from this shift: Total I/O In: 240 [P.O.:240] Out: 450 [Urine:450]  Medications: Current Facility-Administered Medications  Medication Dose Route Frequency Provider Last Rate Last Dose  . acetaminophen (TYLENOL) tablet 650 mg  650 mg Oral Q6H PRN Reyne Dumas, MD   650 mg at 11/14/13 1157   Or  . acetaminophen (TYLENOL) suppository 650 mg  650 mg Rectal Q6H PRN Reyne Dumas, MD      . aspirin EC tablet 81 mg  81 mg Oral Daily Reyne Dumas, MD   81 mg at 11/14/13 1143  . cloNIDine (CATAPRES) tablet 0.1 mg  0.1 mg Oral BID Reyne Dumas, MD   0.1 mg at 11/14/13 1143  . diltiazem (CARDIZEM CD) 24 hr capsule 180 mg  180 mg Oral Daily Almyra Deforest, [place]   180 mg at 11/14/13 1147  . diltiazem (CARDIZEM) 100 mg in dextrose 5 % 100 mL (1 mg/mL) infusion  5-15 mg/hr Intravenous Continuous Almyra Deforest, PA   10 mg/hr at 11/13/13 1702  . enoxaparin (LOVENOX) injection 40 mg  40 mg Subcutaneous Q24H Reyne Dumas, MD   40 mg at 11/13/13 1725  . feeding supplement (GLUCERNA SHAKE) (GLUCERNA SHAKE) liquid 237 mL  237 mL Oral BID BM Rogue Bussing, RD      . furosemide (LASIX) tablet 40 mg  40 mg Oral Daily PRN Reyne Dumas, MD      . furosemide (LASIX) tablet 40 mg  40 mg Oral Daily Almyra Deforest, PA   40 mg at 11/14/13 1145   . glimepiride (AMARYL) tablet 2 mg  2 mg Oral QAC breakfast Reyne Dumas, MD   2 mg at 11/14/13 0911  . levalbuterol (XOPENEX) nebulizer solution 0.63 mg  0.63 mg Nebulization Q6H PRN Reyne Dumas, MD      . ondansetron (ZOFRAN) tablet 4 mg  4 mg Oral Q6H PRN Reyne Dumas, MD       Or  . ondansetron (ZOFRAN) injection 4 mg  4 mg Intravenous Q6H PRN Reyne Dumas, MD      . oxybutynin (DITROPAN-XL) 24 hr tablet 5 mg  5 mg Oral Daily Reyne Dumas, MD   5 mg at 11/14/13 1145  . pantoprazole (PROTONIX) EC tablet 40 mg  40 mg Oral Daily Reyne Dumas, MD   40 mg at 11/14/13 1151  . potassium chloride SA (K-DUR,KLOR-CON) CR tablet 20 mEq  20 mEq Oral Daily Reyne Dumas, MD   20 mEq at 11/13/13 2128  . potassium chloride SA (K-DUR,KLOR-CON) CR tablet 20 mEq  20 mEq Oral Daily Almyra Deforest, [place]   20 mEq at 11/14/13 1144  . simvastatin (ZOCOR) tablet 5 mg  5 mg Oral q1800 Reyne Dumas, MD   5 mg at 11/13/13 2128    Physical Exam: General appearance: alert  and no distress Lungs: clear to auscultation bilaterally Heart: irregularly irregular rhythm Extremities: extremities normal, atraumatic, no cyanosis or edema and right knee swelling and pain  Lab Results: Results for orders placed during the hospital encounter of 11/13/13 (from the past 48 hour(s))  BASIC METABOLIC PANEL     Status: Abnormal   Collection Time    11/13/13  2:15 PM      Result Value Ref Range   Sodium 144  137 - 147 mEq/L   Potassium 4.1  3.7 - 5.3 mEq/L   Chloride 102  96 - 112 mEq/L   CO2 26  19 - 32 mEq/L   Glucose, Bld 120 (*) 70 - 99 mg/dL   BUN 12  6 - 23 mg/dL   Creatinine, Ser 1.01  0.50 - 1.10 mg/dL   Calcium 9.7  8.4 - 10.5 mg/dL   GFR calc non Af Amer 53 (*) >90 mL/min   GFR calc Af Amer 62 (*) >90 mL/min   Comment: (NOTE)     The eGFR has been calculated using the CKD EPI equation.     This calculation has not been validated in all clinical situations.     eGFR's persistently <90 mL/min signify possible Chronic  Kidney     Disease.   Anion gap 16 (*) 5 - 15  CBC WITH DIFFERENTIAL     Status: Abnormal   Collection Time    11/13/13  2:15 PM      Result Value Ref Range   WBC 7.5  4.0 - 10.5 K/uL   RBC 3.78 (*) 3.87 - 5.11 MIL/uL   Hemoglobin 10.4 (*) 12.0 - 15.0 g/dL   HCT 31.9 (*) 36.0 - 46.0 %   MCV 84.4  78.0 - 100.0 fL   MCH 27.5  26.0 - 34.0 pg   MCHC 32.6  30.0 - 36.0 g/dL   RDW 15.4  11.5 - 15.5 %   Platelets 214  150 - 400 K/uL   Neutrophils Relative % 63  43 - 77 %   Neutro Abs 4.8  1.7 - 7.7 K/uL   Lymphocytes Relative 26  12 - 46 %   Lymphs Abs 1.9  0.7 - 4.0 K/uL   Monocytes Relative 10  3 - 12 %   Monocytes Absolute 0.7  0.1 - 1.0 K/uL   Eosinophils Relative 1  0 - 5 %   Eosinophils Absolute 0.1  0.0 - 0.7 K/uL   Basophils Relative 0  0 - 1 %   Basophils Absolute 0.0  0.0 - 0.1 K/uL  TROPONIN I     Status: None   Collection Time    11/13/13  2:15 PM      Result Value Ref Range   Troponin I <0.30  <0.30 ng/mL   Comment:            Due to the release kinetics of cTnI,     a negative result within the first hours     of the onset of symptoms does not rule out     myocardial infarction with certainty.     If myocardial infarction is still suspected,     repeat the test at appropriate intervals.  PRO B NATRIURETIC PEPTIDE     Status: Abnormal   Collection Time    11/13/13  2:15 PM      Result Value Ref Range   Pro B Natriuretic peptide (BNP) 683.9 (*) 0 - 125 pg/mL  CBC  Status: Abnormal   Collection Time    11/13/13  3:58 PM      Result Value Ref Range   WBC 8.1  4.0 - 10.5 K/uL   RBC 3.70 (*) 3.87 - 5.11 MIL/uL   Hemoglobin 10.4 (*) 12.0 - 15.0 g/dL   HCT 31.2 (*) 36.0 - 46.0 %   MCV 84.3  78.0 - 100.0 fL   MCH 28.1  26.0 - 34.0 pg   MCHC 33.3  30.0 - 36.0 g/dL   RDW 15.3  11.5 - 15.5 %   Platelets 271  150 - 400 K/uL  CREATININE, SERUM     Status: Abnormal   Collection Time    11/13/13  3:58 PM      Result Value Ref Range   Creatinine, Ser 0.93  0.50 -  1.10 mg/dL   GFR calc non Af Amer 59 (*) >90 mL/min   GFR calc Af Amer 68 (*) >90 mL/min   Comment: (NOTE)     The eGFR has been calculated using the CKD EPI equation.     This calculation has not been validated in all clinical situations.     eGFR's persistently <90 mL/min signify possible Chronic Kidney     Disease.  TROPONIN I     Status: None   Collection Time    11/13/13  4:00 PM      Result Value Ref Range   Troponin I <0.30  <0.30 ng/mL   Comment:            Due to the release kinetics of cTnI,     a negative result within the first hours     of the onset of symptoms does not rule out     myocardial infarction with certainty.     If myocardial infarction is still suspected,     repeat the test at appropriate intervals.  TSH     Status: None   Collection Time    11/13/13  4:01 PM      Result Value Ref Range   TSH 0.938  0.350 - 4.500 uIU/mL  MRSA PCR SCREENING     Status: None   Collection Time    11/13/13  7:47 PM      Result Value Ref Range   MRSA by PCR NEGATIVE  NEGATIVE   Comment:            The GeneXpert MRSA Assay (FDA     approved for NASAL specimens     only), is one component of a     comprehensive MRSA colonization     surveillance program. It is not     intended to diagnose MRSA     infection nor to guide or     monitor treatment for     MRSA infections.  TROPONIN I     Status: None   Collection Time    11/14/13  3:03 AM      Result Value Ref Range   Troponin I <0.30  <0.30 ng/mL   Comment:            Due to the release kinetics of cTnI,     a negative result within the first hours     of the onset of symptoms does not rule out     myocardial infarction with certainty.     If myocardial infarction is still suspected,     repeat the test at appropriate intervals.  COMPREHENSIVE METABOLIC PANEL     Status:  Abnormal   Collection Time    11/14/13  3:03 AM      Result Value Ref Range   Sodium 142  137 - 147 mEq/L   Potassium 4.0  3.7 - 5.3 mEq/L     Chloride 105  96 - 112 mEq/L   CO2 24  19 - 32 mEq/L   Glucose, Bld 91  70 - 99 mg/dL   BUN 11  6 - 23 mg/dL   Creatinine, Ser 5.66  0.50 - 1.10 mg/dL   Calcium 9.0  8.4 - 90.5 mg/dL   Total Protein 7.7  6.0 - 8.3 g/dL   Albumin 2.8 (*) 3.5 - 5.2 g/dL   AST 16  0 - 37 U/L   ALT 10  0 - 35 U/L   Alkaline Phosphatase 78  39 - 117 U/L   Total Bilirubin 1.2  0.3 - 1.2 mg/dL   GFR calc non Af Amer 62 (*) >90 mL/min   GFR calc Af Amer 72 (*) >90 mL/min   Comment: (NOTE)     The eGFR has been calculated using the CKD EPI equation.     This calculation has not been validated in all clinical situations.     eGFR's persistently <90 mL/min signify possible Chronic Kidney     Disease.   Anion gap 13  5 - 15  CBC     Status: Abnormal   Collection Time    11/14/13  3:03 AM      Result Value Ref Range   WBC 6.6  4.0 - 10.5 K/uL   RBC 3.38 (*) 3.87 - 5.11 MIL/uL   Hemoglobin 9.4 (*) 12.0 - 15.0 g/dL   HCT 03.9 (*) 97.1 - 37.3 %   MCV 84.9  78.0 - 100.0 fL   MCH 27.8  26.0 - 34.0 pg   MCHC 32.8  30.0 - 36.0 g/dL   RDW 45.6 (*) 34.6 - 17.1 %   Platelets 241  150 - 400 K/uL  GLUCOSE, CAPILLARY     Status: Abnormal   Collection Time    11/14/13  8:58 AM      Result Value Ref Range   Glucose-Capillary 106 (*) 70 - 99 mg/dL   Comment 1 Notify RN    SYNOVIAL CELL COUNT + DIFF, W/ CRYSTALS     Status: Abnormal   Collection Time    11/14/13 10:35 AM      Result Value Ref Range   Color, Synovial RED (*) YELLOW   Appearance-Synovial BLOODY (*) CLEAR   Crystals, Fluid NO CRYSTALS SEEN     WBC, Synovial 328 (*) 0 - 200 /cu mm   Neutrophil, Synovial 83 (*) 0 - 25 %   Lymphocytes-Synovial Fld 14  0 - 20 %   Monocyte-Macrophage-Synovial Fluid 3 (*) 50 - 90 %   Eosinophils-Synovial 0  0 - 1 %    Imaging: Dg Chest Port 1 View  11/13/2013   CLINICAL DATA:  Irregular heartbeat.  EXAM: PORTABLE CHEST - 1 VIEW  COMPARISON:  05/01/2010 and 02/03/2008 as well as chest CT 05/01/2010  FINDINGS: Lungs  are somewhat hypoinflated without consolidation or effusion. There is mild prominence of the perihilar markings suggesting a mild degree of vascular congestion. Mild to moderate cardiomegaly is present. There are degenerative changes of the spine with mild curvature of the thoracic spine convex to the right unchanged.  IMPRESSION: Mild to moderate cardiomegaly with suggestion of minimal vascular congestion.   Electronically Signed   By:  Marin Olp M.D.   On: 11/13/2013 14:32   Dg Fluoro Guide Ndl Plc/bx  11/14/2013   CLINICAL DATA:  Painful, swollen right knee. Remote history of total knee arthroplasty. Abnormal bone scan.  EXAM: Right knee aspiration under fluoroscopy  FLUOROSCOPY TIME:  0 min and 18 seconds  PROCEDURE: Written informed consent was obtained. Discussed complications of bleeding, infection and damage to normal structures.  An appropriate site for arthrocentesis was marked on the patient's skin along the medial aspect of the patella. The site was prepped and draped in the usual sterile fashion and local anesthesia was achieved with 1% xylocaine. A 20 gauge spinal needle was then inserted into the knee joint. I was not able to aspirate any significant fluid. I did inject 5 cc of Omnipaque 180 to document intra-articular location and then instilled 10 cc of sterile normal saline. I aspirated back approximately 8 cc of slightly bloody fluid. This will be sent for appropriate laboratory evaluation.  IMPRESSION: Fluoroscopic guided right knee joint aspiration.   Electronically Signed   By: Kalman Jewels M.D.   On: 11/14/2013 13:59    Assessment:  Principal Problem:   Atrial fibrillation with rapid ventricular response Active Problems:   HYPERTENSION, BENIGN   Knee pain, right   Plan:  A-fib is now rate-controlled on cardizem CD 180 mg daily. History of GI bleeding in the past made her not a candidate for long-term anticoagulation. Echo results are pending today. She was started on  lasix daily. No further suggestions at this time.   Time Spent Directly with Patient:  15 minutes  Length of Stay:  LOS: 1 day   Pixie Casino, MD, [facility] Attending Cardiologist [facility]  Davonda Ausley C 11/14/2013, 2:03 PM

## 2013-11-14 NOTE — Progress Notes (Signed)
UR Completed.  Robin Snow [phone] 11/14/2013  

## 2013-11-14 NOTE — Progress Notes (Signed)
INITIAL NUTRITION ASSESSMENT  DOCUMENTATION CODES Per approved criteria  -Obesity Unspecified   INTERVENTION: Glucerna Shake po BID, each supplement provides 220 kcal and 10 grams of protein RD to follow for nutrition care plan  NUTRITION DIAGNOSIS: Inadequate oral intake related to limited appetite as evidenced by patient report  Goal: Pt to meet >/= 90% of their estimated nutrition needs   Monitor:  PO & supplemental intake, weight, labs, I/O's  Reason for Assessment: Malnutrition Screening Tool Report  73 y.o. female  Admitting Dx: atrial fibrillation with RVR  ASSESSMENT: 73 year old Female with PMH of atrial fibrillation, hypertension, diabetes, history of diverticulosis, history of anemia secondary to GI bleed requiring transfusion; while at her PCPs office, noted to have an irregular heart rate. EKG was obtained which showed atrial fibrillation with RVR with heart rate in the 120s. She was referred to [facility] for further evaluation.   Patient reports a decreased appetite; states at home she tried to consume 3 meals per day, but lately was eating "knick knacks;" no % PO intake records available; weight has been stable; pt would like Glucerna Shakes during hospitalization; RD to order.  Nutrition focused physical exam completed.  No muscle or subcutaneous fat depletion noticed.  Height: Ht Readings from Last 1 Encounters:  11/13/13 5\' 6"  (1.676 m)    Weight: Wt Readings from Last 1 Encounters:  11/13/13 231 lb 7.7 oz (105 kg)    Ideal Body Weight: 130 lb  % Ideal Body Weight: 177%  Wt Readings from Last 10 Encounters:  11/13/13 231 lb 7.7 oz (105 kg)  05/31/13 263 lb 12.8 oz (119.659 kg)  01/27/13 238 lb 1.6 oz (108 kg)  01/27/13 238 lb 1.6 oz (108 kg)  01/27/13 238 lb 1.6 oz (108 kg)  05/22/10 263 lb 8 oz (119.523 kg)    Usual Body Weight: 238 lb  % Usual Body Weight: 97%  BMI:  Body mass index is 37.38 kg/(m^2).  Estimated Nutritional  Needs: Kcal: 1700-1900 Protein: 80-90 gm Fluid: 1.7-1.9 L  Skin: Intact  Diet Order: Cardiac  EDUCATION NEEDS: -No education needs identified at this time   Intake/Output Summary (Last 24 hours) at 11/14/13 1150 Last data filed at 11/14/13 0600  Gross per 24 hour  Intake 678.75 ml  Output    600 ml  Net  78.75 ml   Labs:   Recent Labs Lab 11/13/13 1415 11/13/13 1558 11/14/13 0303  NA 144  --  142  K 4.1  --  4.0  CL 102  --  105  CO2 26  --  24  BUN 12  --  11  CREATININE 1.01 0.93 0.89  CALCIUM 9.7  --  9.0  GLUCOSE 120*  --  91    CBG (last 3)   Recent Labs  11/14/13 0858  GLUCAP 106*    Scheduled Meds: . aspirin EC  81 mg Oral Daily  . cloNIDine  0.1 mg Oral BID  . diltiazem  180 mg Oral Daily  . enoxaparin (LOVENOX) injection  40 mg Subcutaneous Q24H  . furosemide  40 mg Oral Daily  . glimepiride  2 mg Oral QAC breakfast  . oxybutynin  5 mg Oral Daily  . pantoprazole  40 mg Oral Daily  . potassium chloride SA  20 mEq Oral Daily  . potassium chloride  20 mEq Oral Daily  . simvastatin  5 mg Oral q1800    Continuous Infusions: . diltiazem (CARDIZEM) infusion Stopped (11/13/13 2300)    Past  Medical History  Diagnosis Date  . Atrial fibrillation   . Hypertension   . Arthritis   . Coagulopathy   . Diabetes mellitus     Type 2  . GI bleed   . Pneumonia     2-3 years ago  . GERD (gastroesophageal reflux disease)   . Anemia   . Hx of transfusion of packed red blood cells   . Diverticulosis   . Anticoagulant drug declined     due to hx GI bleeding    Past Surgical History  Procedure Laterality Date  . Cesarean section    . Replacement total knee bilateral    . Tubal ligation    . Esophagogastroduodenoscopy Left 01/23/2013    Procedure: ESOPHAGOGASTRODUODENOSCOPY (EGD);  Surgeon: Willis ModenaWilliam Outlaw, MD;  [facility];  Service: Endoscopy;  Laterality: Left;  . Colonoscopy Left 01/24/2013    Procedure: COLONOSCOPY;  Surgeon:  Willis ModenaWilliam Outlaw, MD;  [facility];  Service: Endoscopy;  Laterality: Left;  [name]. Eye surgery Bilateral     cataract surgery  . Joint replacement      Maureen ChattersKatie Marguerite Jarboe, RD, LDN Pager #: [phone] After-Hours Pager #: [phone]

## 2013-11-14 NOTE — Progress Notes (Signed)
[facility] PROGRESS NOTE  Buddy DutyBarbara P Altmann WUJ:[id number] DOB: Jun 21, 1940 DOA: 11/13/2013 PCP: Burtis JunesBLOUNT,[name], MD  Assessment/Plan: Active Problems:   Atrial fibrillation   Atrial fibrillation with rapid ventricular response   Atrial fibrillation, chronic  On Cardizem drip for rate control, cardiology has initiated the patient on by mouth Cardizem  Has been on digoxin previously but was discontinued because of bradycardia  Is not on anticoagulation because of history of GI bleeding  Appreciate cardiology input  TSH normal, 2-D echo pending  Right knee pain  Status post TRK x 2. She has right knee effusion and warmth. She has had a lot of pain in her right knee chronically  We'll schedule her for arthrocentesis under fluoroscopy  Obtained body fluid cell count, culture  If this is positive for septic arthritis please call orthopedics , orthopedics was not notified because this could also be gout She has already seen 2 orthopedic surgeons in the outpatient setting as well [facility]  She has been ruled out for a septic joint in the past   Diabetes mellitus  Continue Amaryl  We'll start the patient on sliding scale insulin   Dyslipidemia continue simvastatin  Hypertension continue hydrochlorothiazide  GERD Continue Protonix     Code Status: full Family Communication: family updated about patient's clinical progress Disposition Plan:  Anticipate discharge tomorrow   Brief narrative: 73 year old African American female with past medical history of atrial fibrillation, hypertension, diabetes, history of diverticulosis, history of anemia secondary to GI bleed requiring transfusion. Patient had a negative cardiac catheterization in 2012 which revealed normal coronaries. She has not followed up with any cardiologist since 2012. Surprisingly, she has not been on any AV nodal blocking drug. Per patient, she has no prior knowledge of adverse reaction to any AV nodal  blocking drug either. She states her heart rate has always been under control. She was previously taking Coumadin for several years. However she had significant GI bleed and was admitted in October 2014. She underwent endoscopy and colonoscopy at the time by Dr. Dulce Sellarutlaw which revealed diffuse diverticulosis and slight gastritis. GI bleed scan showed active GI bleeding in the transverse colon at the time, however this was not seen on colonoscopy. This led to mesenteric angiogram by interventional radiology, however at that point, her GI bleed has stopped and no active GI bleeding source was identified. Patient returned in February 2015 with recurrent GI bleed requiring transfusion. She was noted to have atrial fibrillation in the time however rate controlled. Cardiology service were not involved in either of the previous hospitalization. She underwent repeat colonoscopy in April 2015 which did not reveal any significant bleeding source. According to the patient, she was previously active prior to February 2015. However in the past several month, she has been having significant right knee pain prohibiting her from any strenuous activity. She denies any recent lower extremity edema, orthopnea or paroxysmal nocturnal dyspnea. She does take 40 mg PRN Lasix at home. She states if her ankle becomes swollen, she would often take a entire week of Lasix. She denies any recent fever, chill, or cough.  She presented her PCPs office in the morning of 11/13/2013. While at the PCPs office, she was noted to have an irregular heart rate. EKG was obtained which showed atrial fibrillation with RVR with heart rate in the 120s. [facility] Initial EKG showed atrial fibrillation with heart rate in the 150s. Significant laboratory finding include Cr of  1.01, negative troponin x2, proBNP of 683.9, hemoglobin of 10.4 and glucose of 120. Chest x-ray showed minimal vascular congestion. Internal  medicine was consulted to admit the patient. IV diltiazem was started which decreased patient's heart rate down to the 90s to 100 range. Cardiology was consulted for atrial fibrillation with RVR and direction of anticoagulation therapy.   Consultants:  Radiology  Procedures: None  Antibiotics:  None  HPI/Subjective: Hemodynamically stable overnight  Objective: Filed Vitals:   11/14/13 0400 11/14/13 0759 11/14/13 1143 11/14/13 1147  BP: 120/67 137/89 143/79 143/79  Pulse: 82 104    Temp: 98.8 F (37.1 C) 98.4 F (36.9 C)    TempSrc: Oral Oral    Resp: 16 15    Height:      Weight:      SpO2: 100% 98%      Intake/Output Summary (Last 24 hours) at 11/14/13 1244 Last data filed at 11/14/13 0600  Gross per 24 hour  Intake 678.75 ml  Output    600 ml  Net  78.75 ml    Exam:  General: alert & oriented x 3 In NAD  Cardiovascular: RRR, nl S1 s2  Respiratory: Decreased breath sounds at the bases, scattered rhonchi, no crackles  Abdomen: soft +BS NT/ND, no masses palpable  Extremities: No cyanosis and no edema      Data Reviewed: Basic Metabolic Panel:  Recent Labs Lab 11/13/13 1415 11/13/13 1558 11/14/13 0303  NA 144  --  142  K 4.1  --  4.0  CL 102  --  105  CO2 26  --  24  GLUCOSE 120*  --  91  BUN 12  --  11  CREATININE 1.01 0.93 0.89  CALCIUM 9.7  --  9.0    Liver Function Tests:  Recent Labs Lab 11/14/13 0303  AST 16  ALT 10  ALKPHOS 78  BILITOT 1.2  PROT 7.7  ALBUMIN 2.8*   No results found for this basename: LIPASE, AMYLASE,  in the last 168 hours No results found for this basename: AMMONIA,  in the last 168 hours  CBC:  Recent Labs Lab 11/13/13 1415 11/13/13 1558 11/14/13 0303  WBC 7.5 8.1 6.6  NEUTROABS 4.8  --   --   HGB 10.4* 10.4* 9.4*  HCT 31.9* 31.2* 28.7*  MCV 84.4 84.3 84.9  PLT 214 271 241    Cardiac Enzymes:  Recent Labs Lab 11/13/13 1415 11/13/13 1600 11/14/13 0303  TROPONINI <0.30 <0.30 <0.30   BNP  (last 3 results)  Recent Labs  11/13/13 1415  PROBNP 683.9*     CBG:  Recent Labs Lab 11/14/13 0858  GLUCAP 106*    Recent Results (from the past 240 hour(s))  MRSA PCR SCREENING     Status: None   Collection Time    11/13/13  7:47 PM      Result Value Ref Range Status   MRSA by PCR NEGATIVE  NEGATIVE Final   Comment:            The GeneXpert MRSA Assay (FDA     approved for NASAL specimens     only), is one component of a     comprehensive MRSA colonization     surveillance program. It is not     intended to diagnose MRSA     infection nor to guide or     monitor treatment for     MRSA infections.     Studies: Dg Chest [facility] [address]  11/13/2013   CLINICAL DATA:  Irregular heartbeat.  EXAM: PORTABLE CHEST - 1 VIEW  COMPARISON:  05/01/2010 and 02/03/2008 as well as chest CT 05/01/2010  FINDINGS: Lungs are somewhat hypoinflated without consolidation or effusion. There is mild prominence of the perihilar markings suggesting a mild degree of vascular congestion. Mild to moderate cardiomegaly is present. There are degenerative changes of the spine with mild curvature of the thoracic spine convex to the right unchanged.  IMPRESSION: Mild to moderate cardiomegaly with suggestion of minimal vascular congestion.   Electronically Signed   By: Elberta Fortis M.D.   On: 11/13/2013 14:32    Scheduled Meds: . aspirin EC  81 mg Oral Daily  . cloNIDine  0.1 mg Oral BID  . diltiazem  180 mg Oral Daily  . enoxaparin (LOVENOX) injection  40 mg Subcutaneous Q24H  . feeding supplement (GLUCERNA SHAKE)  237 mL Oral BID BM  . furosemide  40 mg Oral Daily  . glimepiride  2 mg Oral QAC breakfast  . oxybutynin  5 mg Oral Daily  . pantoprazole  40 mg Oral Daily  . potassium chloride SA  20 mEq Oral Daily  . potassium chloride  20 mEq Oral Daily  . simvastatin  5 mg Oral q1800   Continuous Infusions: . diltiazem (CARDIZEM) infusion Stopped (11/13/13 2300)    Active Problems:   Atrial  fibrillation   Atrial fibrillation with rapid ventricular response    Time spent: 40 minutes   [facility]  [facility] Pager [phone]. If 7PM-7AM, please contact night-coverage at [email or web address], password [facility] 11/14/2013, 12:44 PM  LOS: 1 day

## 2013-11-14 NOTE — Progress Notes (Signed)
  Echocardiogram 2D Echocardiogram has been performed.  Arvil ChacoFoster, Cristopher Ciccarelli 11/14/2013, 1:10 PM

## 2013-11-15 MED ORDER — TRAMADOL HCL 50 MG PO TABS
100.0000 mg | ORAL_TABLET | Freq: Four times a day (QID) | ORAL | Status: DC | PRN
  Administered 2013-11-15 – 2013-11-16 (×3): 100 mg via ORAL
  Filled 2013-11-15 (×3): qty 2

## 2013-11-15 MED ORDER — METOPROLOL TARTRATE 12.5 MG HALF TABLET
12.5000 mg | ORAL_TABLET | Freq: Two times a day (BID) | ORAL | Status: DC
  Administered 2013-11-15 – 2013-11-16 (×3): 12.5 mg via ORAL
  Filled 2013-11-15 (×4): qty 1

## 2013-11-15 NOTE — Progress Notes (Signed)
Pt transferred by wheelchair on tele to 3W. Pt having increased heart rate with activity. Dr Rennis GoldenHilty aware of pts increased HR.  Brett Darko M. Derrell LollingIngram, RN, BSN 11/15/2013 9:49 AM

## 2013-11-15 NOTE — Progress Notes (Addendum)
[facility]  DAILY PROGRESS NOTE  Subjective:  HR going up with ambulation - requesting additional control.  Objective:  Temp:  [97.9 F (36.6 C)-98.8 F (37.1 C)] 98.4 F (36.9 C) (08/12 0400) Pulse Rate:  [84-159] 159 (08/12 0400) Resp:  [12-20] 20 (08/12 0400) BP: (97-143)/(51-94) 121/70 mmHg (08/12 0400) SpO2:  [97 %-99 %] 98 % (08/12 0400) Weight change:   Intake/Output from previous day: 08/11 0701 - 08/12 0700 In: 1230 [P.O.:480; I.V.:750] Out: 650 [Urine:650]  Intake/Output from this shift:    Medications: Current Facility-Administered Medications  Medication Dose Route Frequency Provider Last Rate Last Dose  . acetaminophen (TYLENOL) tablet 650 mg  650 mg Oral Q6H PRN Reyne Dumas, MD   650 mg at 11/15/13 0434   Or  . acetaminophen (TYLENOL) suppository 650 mg  650 mg Rectal Q6H PRN Reyne Dumas, MD      . aspirin EC tablet 81 mg  81 mg Oral Daily Reyne Dumas, MD   81 mg at 11/14/13 1143  . cloNIDine (CATAPRES) tablet 0.1 mg  0.1 mg Oral BID Reyne Dumas, MD   0.1 mg at 11/14/13 2255  . diltiazem (CARDIZEM CD) 24 hr capsule 180 mg  180 mg Oral Daily Almyra Deforest, [place]   180 mg at 11/14/13 1147  . enoxaparin (LOVENOX) injection 40 mg  40 mg Subcutaneous Q24H Reyne Dumas, MD   40 mg at 11/14/13 1709  . feeding supplement (GLUCERNA SHAKE) (GLUCERNA SHAKE) liquid 237 mL  237 mL Oral BID BM Rogue Bussing, RD   237 mL at 11/14/13 1709  . furosemide (LASIX) tablet 40 mg  40 mg Oral Daily Almyra Deforest, [place]   40 mg at 11/14/13 1145  . glimepiride (AMARYL) tablet 2 mg  2 mg Oral QAC breakfast Reyne Dumas, MD   2 mg at 11/14/13 0911  . levalbuterol (XOPENEX) nebulizer solution 0.63 mg  0.63 mg Nebulization Q6H PRN Reyne Dumas, MD      . ondansetron (ZOFRAN) tablet 4 mg  4 mg Oral Q6H PRN Reyne Dumas, MD       Or  . ondansetron (ZOFRAN) injection 4 mg  4 mg Intravenous Q6H PRN Reyne Dumas, MD      . oxybutynin (DITROPAN-XL) 24 hr tablet 5 mg  5 mg Oral Daily Reyne Dumas, MD   5 mg at 11/14/13 1145  . pantoprazole (PROTONIX) EC tablet 40 mg  40 mg Oral Daily Reyne Dumas, MD   40 mg at 11/14/13 1151  . potassium chloride SA (K-DUR,KLOR-CON) CR tablet 20 mEq  20 mEq Oral Daily Almyra Deforest, [place]   20 mEq at 11/14/13 1144  . simvastatin (ZOCOR) tablet 5 mg  5 mg Oral q1800 Reyne Dumas, MD   5 mg at 11/14/13 1930  . traMADol (ULTRAM) tablet 100 mg  100 mg Oral Q6H PRN Janece Canterbury, MD        Physical Exam: General appearance: alert and no distress Lungs: clear to auscultation bilaterally Heart: irregularly irregular rhythm Extremities: extremities normal, atraumatic, no cyanosis or edema and right knee swelling and pain  Lab Results: Results for orders placed during the hospital encounter of 11/13/13 (from the past 48 hour(s))  BASIC METABOLIC PANEL     Status: Abnormal   Collection Time    11/13/13  2:15 PM      Result Value Ref Range   Sodium 144  137 - 147 mEq/L   Potassium 4.1  3.7 - 5.3 mEq/L   Chloride 102  96 - 112 mEq/L   CO2 26  19 - 32 mEq/L   Glucose, Bld 120 (*) 70 - 99 mg/dL   BUN 12  6 - 23 mg/dL   Creatinine, Ser 1.01  0.50 - 1.10 mg/dL   Calcium 9.7  8.4 - 10.5 mg/dL   GFR calc non Af Amer 53 (*) >90 mL/min   GFR calc Af Amer 62 (*) >90 mL/min   Comment: (NOTE)     The eGFR has been calculated using the CKD EPI equation.     This calculation has not been validated in all clinical situations.     eGFR's persistently <90 mL/min signify possible Chronic Kidney     Disease.   Anion gap 16 (*) 5 - 15  CBC WITH DIFFERENTIAL     Status: Abnormal   Collection Time    11/13/13  2:15 PM      Result Value Ref Range   WBC 7.5  4.0 - 10.5 K/uL   RBC 3.78 (*) 3.87 - 5.11 MIL/uL   Hemoglobin 10.4 (*) 12.0 - 15.0 g/dL   HCT 31.9 (*) 36.0 - 46.0 %   MCV 84.4  78.0 - 100.0 fL   MCH 27.5  26.0 - 34.0 pg   MCHC 32.6  30.0 - 36.0 g/dL   RDW 15.4  11.5 - 15.5 %   Platelets 214  150 - 400 K/uL   Neutrophils Relative % 63  43 - 77 %   Neutro  Abs 4.8  1.7 - 7.7 K/uL   Lymphocytes Relative 26  12 - 46 %   Lymphs Abs 1.9  0.7 - 4.0 K/uL   Monocytes Relative 10  3 - 12 %   Monocytes Absolute 0.7  0.1 - 1.0 K/uL   Eosinophils Relative 1  0 - 5 %   Eosinophils Absolute 0.1  0.0 - 0.7 K/uL   Basophils Relative 0  0 - 1 %   Basophils Absolute 0.0  0.0 - 0.1 K/uL  TROPONIN I     Status: None   Collection Time    11/13/13  2:15 PM      Result Value Ref Range   Troponin I <0.30  <0.30 ng/mL   Comment:            Due to the release kinetics of cTnI,     a negative result within the first hours     of the onset of symptoms does not rule out     myocardial infarction with certainty.     If myocardial infarction is still suspected,     repeat the test at appropriate intervals.  PRO B NATRIURETIC PEPTIDE     Status: Abnormal   Collection Time    11/13/13  2:15 PM      Result Value Ref Range   Pro B Natriuretic peptide (BNP) 683.9 (*) 0 - 125 pg/mL  CBC     Status: Abnormal   Collection Time    11/13/13  3:58 PM      Result Value Ref Range   WBC 8.1  4.0 - 10.5 K/uL   RBC 3.70 (*) 3.87 - 5.11 MIL/uL   Hemoglobin 10.4 (*) 12.0 - 15.0 g/dL   HCT 31.2 (*) 36.0 - 46.0 %   MCV 84.3  78.0 - 100.0 fL   MCH 28.1  26.0 - 34.0 pg   MCHC 33.3  30.0 - 36.0 g/dL   RDW 15.3  11.5 - 15.5 %   Platelets  271  150 - 400 K/uL  CREATININE, SERUM     Status: Abnormal   Collection Time    11/13/13  3:58 PM      Result Value Ref Range   Creatinine, Ser 0.93  0.50 - 1.10 mg/dL   GFR calc non Af Amer 59 (*) >90 mL/min   GFR calc Af Amer 68 (*) >90 mL/min   Comment: (NOTE)     The eGFR has been calculated using the CKD EPI equation.     This calculation has not been validated in all clinical situations.     eGFR's persistently <90 mL/min signify possible Chronic Kidney     Disease.  TROPONIN I     Status: None   Collection Time    11/13/13  4:00 PM      Result Value Ref Range   Troponin I <0.30  <0.30 ng/mL   Comment:            Due to the  release kinetics of cTnI,     a negative result within the first hours     of the onset of symptoms does not rule out     myocardial infarction with certainty.     If myocardial infarction is still suspected,     repeat the test at appropriate intervals.  TSH     Status: None   Collection Time    11/13/13  4:01 PM      Result Value Ref Range   TSH 0.938  0.350 - 4.500 uIU/mL  MRSA PCR SCREENING     Status: None   Collection Time    11/13/13  7:47 PM      Result Value Ref Range   MRSA by PCR NEGATIVE  NEGATIVE   Comment:            The GeneXpert MRSA Assay (FDA     approved for NASAL specimens     only), is one component of a     comprehensive MRSA colonization     surveillance program. It is not     intended to diagnose MRSA     infection nor to guide or     monitor treatment for     MRSA infections.  TROPONIN I     Status: None   Collection Time    11/14/13  3:03 AM      Result Value Ref Range   Troponin I <0.30  <0.30 ng/mL   Comment:            Due to the release kinetics of cTnI,     a negative result within the first hours     of the onset of symptoms does not rule out     myocardial infarction with certainty.     If myocardial infarction is still suspected,     repeat the test at appropriate intervals.  COMPREHENSIVE METABOLIC PANEL     Status: Abnormal   Collection Time    11/14/13  3:03 AM      Result Value Ref Range   Sodium 142  137 - 147 mEq/L   Potassium 4.0  3.7 - 5.3 mEq/L   Chloride 105  96 - 112 mEq/L   CO2 24  19 - 32 mEq/L   Glucose, Bld 91  70 - 99 mg/dL   BUN 11  6 - 23 mg/dL   Creatinine, Ser 0.89  0.50 - 1.10 mg/dL   Calcium 9.0  8.4 - 10.5 mg/dL  Total Protein 7.7  6.0 - 8.3 g/dL   Albumin 2.8 (*) 3.5 - 5.2 g/dL   AST 16  0 - 37 U/L   ALT 10  0 - 35 U/L   Alkaline Phosphatase 78  39 - 117 U/L   Total Bilirubin 1.2  0.3 - 1.2 mg/dL   GFR calc non Af Amer 62 (*) >90 mL/min   GFR calc Af Amer 72 (*) >90 mL/min   Comment: (NOTE)     The  eGFR has been calculated using the CKD EPI equation.     This calculation has not been validated in all clinical situations.     eGFR's persistently <90 mL/min signify possible Chronic Kidney     Disease.   Anion gap 13  5 - 15  CBC     Status: Abnormal   Collection Time    11/14/13  3:03 AM      Result Value Ref Range   WBC 6.6  4.0 - 10.5 K/uL   RBC 3.38 (*) 3.87 - 5.11 MIL/uL   Hemoglobin 9.4 (*) 12.0 - 15.0 g/dL   HCT 28.7 (*) 36.0 - 46.0 %   MCV 84.9  78.0 - 100.0 fL   MCH 27.8  26.0 - 34.0 pg   MCHC 32.8  30.0 - 36.0 g/dL   RDW 15.6 (*) 11.5 - 15.5 %   Platelets 241  150 - 400 K/uL  GLUCOSE, CAPILLARY     Status: Abnormal   Collection Time    11/14/13  8:58 AM      Result Value Ref Range   Glucose-Capillary 106 (*) 70 - 99 mg/dL   Comment 1 Notify RN    SYNOVIAL CELL COUNT + DIFF, W/ CRYSTALS     Status: Abnormal   Collection Time    11/14/13 10:35 AM      Result Value Ref Range   Color, Synovial RED (*) YELLOW   Appearance-Synovial BLOODY (*) CLEAR   Crystals, Fluid NO CRYSTALS SEEN     WBC, Synovial 328 (*) 0 - 200 /cu mm   Neutrophil, Synovial 83 (*) 0 - 25 %   Lymphocytes-Synovial Fld 14  0 - 20 %   Monocyte-Macrophage-Synovial Fluid 3 (*) 50 - 90 %   Eosinophils-Synovial 0  0 - 1 %    Imaging: Dg Chest Port 1 View  11/13/2013   CLINICAL DATA:  Irregular heartbeat.  EXAM: PORTABLE CHEST - 1 VIEW  COMPARISON:  05/01/2010 and 02/03/2008 as well as chest CT 05/01/2010  FINDINGS: Lungs are somewhat hypoinflated without consolidation or effusion. There is mild prominence of the perihilar markings suggesting a mild degree of vascular congestion. Mild to moderate cardiomegaly is present. There are degenerative changes of the spine with mild curvature of the thoracic spine convex to the right unchanged.  IMPRESSION: Mild to moderate cardiomegaly with suggestion of minimal vascular congestion.   Electronically Signed   By: Marin Olp M.D.   On: 11/13/2013 14:32   Dg  Fluoro Guide Ndl Plc/bx  11/14/2013   CLINICAL DATA:  Painful, swollen right knee. Remote history of total knee arthroplasty. Abnormal bone scan.  EXAM: Right knee aspiration under fluoroscopy  FLUOROSCOPY TIME:  0 min and 18 seconds  PROCEDURE: Written informed consent was obtained. Discussed complications of bleeding, infection and damage to normal structures.  An appropriate site for arthrocentesis was marked on the patient's skin along the medial aspect of the patella. The site was prepped and draped in the usual sterile  fashion and local anesthesia was achieved with 1% xylocaine. A 20 gauge spinal needle was then inserted into the knee joint. I was not able to aspirate any significant fluid. I did inject 5 cc of Omnipaque 180 to document intra-articular location and then instilled 10 cc of sterile normal saline. I aspirated back approximately 8 cc of slightly bloody fluid. This will be sent for appropriate laboratory evaluation.  IMPRESSION: Fluoroscopic guided right knee joint aspiration.   Electronically Signed   By: Kalman Jewels M.D.   On: 11/14/2013 13:59    Assessment:  Principal Problem:   Atrial fibrillation with rapid ventricular response Active Problems:   HYPERTENSION, BENIGN   Knee pain, right   Plan:  Add low dose metoprolol 12.5 mg BID to cardizem for sympathetic control with ambulation. She got up to the 170's with ambulation today. If the BB does not help, I may as EP for recommendations.  Time Spent Directly with Patient:  15 minutes  Length of Stay:  LOS: 2 days   Pixie Casino, MD, [facility] Attending Cardiologist [facility]  HILTY,Kenneth C 11/15/2013, 8:44 AM

## 2013-11-15 NOTE — Progress Notes (Signed)
Occupational Therapy Treatment Patient Details Name: Robin Snow MRN: [id number] DOB: Sep 01, 1940 Today's Date: 11/15/2013    History of present illness Pt admitted with A-Fib and RVR.  She has a h/o bil. TKA and has been been having Rt. knee pain with swelling and redness.  Rt knee was aspirated 11/14/13.  PMH includes:  HTN DM; h/o anemia due to GI bleed    OT comments  Pt progressing with ability to perform BADLs.  Her knee pain was increased this afternoon 7/10 which limits her ability to access feet for ADLs.  May benefit from AE instruction   Follow Up Recommendations  No OT follow up;Supervision - Intermittent    Equipment Recommendations  None recommended by OT    Recommendations for Other Services      Precautions / Restrictions Precautions Precautions: Fall       Mobility Bed Mobility Overal bed mobility: Modified Independent                Transfers Overall transfer level: Needs assistance Equipment used: Rolling walker (2 wheeled) Transfers: Sit to/from [employer] Sit to Stand: Min guard Stand pivot transfers: Min guard       General transfer comment: Pt moves slowly and guarded    Balance Overall balance assessment: Needs assistance Sitting-balance support: Feet supported Sitting balance-Leahy Scale: Normal     Standing balance support: No upper extremity supported Standing balance-Leahy Scale: Fair                     ADL       Grooming: Wash/dry hands;Min guard;Standing               Lower Body Dressing: Moderate assistance Lower Body Dressing Details (indicate cue type and reason): Pt with difficulty accessing feet today due to increased Rt. knee pain.  Toilet Transfer: Min guard;Ambulation;Comfort height toilet;Grab bars   Toileting- Clothing Manipulation and Hygiene: Min guard;Sit to/from stand       Functional mobility during ADLs: Min guard;Rolling walker General ADL Comments: Pt reports she  was able to bathe herself this morning with set up only before knee pain flared up      Vision                     Perception     Praxis      Cognition   Behavior During Therapy: [facility] for tasks assessed/performed Overall Cognitive Status: Within Functional Limits for tasks assessed                       Extremity/Trunk Assessment               Exercises     Shoulder Instructions       General Comments      Pertinent Vitals/ Pain       Pain Assessment: 0-10 Pain Score: 7  Pain Location: Rt knee Pain Descriptors / Indicators: Aching Pain Intervention(s): Monitored during session;RN gave pain meds during session;Repositioned  Home Living                                          Prior Functioning/Environment              Frequency Min 2X/week     Progress Toward Goals  OT Goals(current goals can now be found in the  care plan section)  Progress towards OT goals: Progressing toward goals  ADL Goals Pt Will Perform Grooming: with modified independence;standing Pt Will Perform Lower Body Bathing: with modified independence;sit to/from stand Pt Will Perform Lower Body Dressing: with modified independence;sit to/from stand;with adaptive equipment Pt Will Transfer to Toilet: with modified independence;ambulating;regular height toilet;bedside commode;grab bars Pt Will Perform Toileting - Clothing Manipulation and hygiene: with modified independence;sit to/from stand Pt Will Perform Tub/Shower Transfer: Tub transfer;with min guard assist;rolling walker;shower seat  Plan Discharge plan remains appropriate    Co-evaluation                 End of Session Equipment Utilized During Treatment: Rolling walker   Activity Tolerance Patient tolerated treatment well   Patient Left in chair;with call bell/phone within reach   Nurse Communication Mobility status;Patient requests pain meds        Time: 1421-1510 OT Time  Calculation (min): 49 min  Charges: OT General Charges $OT Visit: 1 Procedure OT Treatments $Self Care/Home Management : 23-37 mins $Therapeutic Activity: 8-22 mins  Tony Granquist M 11/15/2013, 6:52 PM

## 2013-11-15 NOTE — Evaluation (Signed)
Physical Therapy Evaluation Patient Details Name: Robin Snow MRN: [id number] DOB: Jun 14, 1940 Today's Date: 11/15/2013   History of Present Illness  Pt admitted with A-Fib and RVR.  She has a h/o bil. TKA and has been been having Rt. knee pain with swelling and redness.  Rt knee was aspirated 11/14/13.  PMH includes:  HTN DM; h/o anemia due to GI bleed   Clinical Impression  Pt admitted with the above. Pt currently with functional limitations due to the deficits listed below (see PT Problem List). At the time of PT eval pt with increased HR with ambulation/functional activity, peaking at 192 bpm at end of gait training. MD and RN present during this time. Pt will benefit from skilled PT to increase their independence and safety with mobility to allow discharge to the venue listed below.       Follow Up Recommendations Home health PT;Supervision - Intermittent    Equipment Recommendations  None recommended by PT    Recommendations for Other Services       Precautions / Restrictions Precautions Precautions: Fall Restrictions Weight Bearing Restrictions: No      Mobility  Bed Mobility Overal bed mobility: Modified Independent                Transfers Overall transfer level: Needs assistance Equipment used: Rolling walker (2 wheeled) Transfers: Sit to/from UGI CorporationStand;Stand Pivot Transfers Sit to Stand: Min guard Stand pivot transfers: Min guard       General transfer comment: Pt asking to do it herself. Therapist with close guard for safety. Slightly unsteady occasionally with SPT, but no physical assist required.   Ambulation/Gait Ambulation/Gait assistance: Min guard Ambulation Distance (Feet): 75 Feet Assistive device: Rolling walker (2 wheeled) Gait Pattern/deviations: Step-to pattern;Decreased stride length;Trunk flexed Gait velocity: Decreased Gait velocity interpretation: Below normal speed for age/gender General Gait Details: HR monitored throughout gait  training. HR increased to 192 bpm at very end of gait training while returning to the recliner. RN and MD present during this time. Pt moving slowly with ambulation.   Stairs            Wheelchair Mobility    Modified Rankin (Stroke Patients Only)       Balance Overall balance assessment: Needs assistance Sitting-balance support: Feet supported;No upper extremity supported Sitting balance-Leahy Scale: Normal     Standing balance support: No upper extremity supported;During functional activity Standing balance-Leahy Scale: Fair                               Pertinent Vitals/Pain Pain Assessment: No/denies pain Pain Intervention(s): Monitored during session    Home Living Family/patient expects to be discharged to:: Private residence Living Arrangements: Children (Dtr, son, 2 grandsons) Available Help at Discharge: Family;Available 24 hours/day Type of Home: House Home Access: Stairs to enter Entrance Stairs-Rails: None Entrance Stairs-Number of Steps: 3 Home Layout: Two level;Able to live on main level with bedroom/bathroom Home Equipment: Dan HumphreysWalker - 2 wheels;Cane - single point;Bedside commode;Shower seat;Wheelchair - manual Additional Comments: Most DME was from daughter who had MS. She passed away in January of this year.     Prior Function Level of Independence: Independent with assistive device(s)         Comments: Able to ambulate throughout the house and often sits on the porch during the day. Uses the RW.     Hand Dominance   Dominant Hand: Right    Extremity/Trunk Assessment  Upper Extremity Assessment: Defer to OT evaluation           Lower Extremity Assessment: Generalized weakness;RLE deficits/detail RLE Deficits / Details: Decreased strength and AROM consistent with long-term guarding of knee due to pain. Today states she has no pain in the knee s/p aspiration but antaglic gait pattern noted during ambulation.     Cervical /  Trunk Assessment: Normal  Communication   Communication: No difficulties  Cognition Arousal/Alertness: Awake/alert Behavior During Therapy: WFL for tasks assessed/performed Overall Cognitive Status: Within Functional Limits for tasks assessed                      General Comments      Exercises        Assessment/Plan    PT Assessment Patient needs continued PT services  PT Diagnosis Difficulty walking;Generalized weakness   PT Problem List Decreased strength;Decreased range of motion;Decreased activity tolerance;Decreased balance;Decreased mobility;Decreased knowledge of use of DME;Decreased safety awareness;Decreased knowledge of precautions;Cardiopulmonary status limiting activity  PT Treatment Interventions DME instruction;Gait training;Stair training;Functional mobility training;Therapeutic activities;Therapeutic exercise;Neuromuscular re-education;Patient/family education   PT Goals (Current goals can be found in the Care Plan section) Acute Rehab PT Goals Patient Stated Goal: to go home PT Goal Formulation: With patient Time For Goal Achievement: 11/22/13 Potential to Achieve Goals: Good    Frequency Min 3X/week   Barriers to discharge        Co-evaluation               End of Session Equipment Utilized During Treatment: Gait belt Activity Tolerance: Patient limited by fatigue Patient left: in chair;with call bell/phone within reach;with nursing/sitter in room Nurse Communication: Mobility status         Time: 1610-9604 PT Time Calculation (min): 36 min   Charges:   PT Evaluation $Initial PT Evaluation Tier I: 1 Procedure PT Treatments $Gait Training: 8-22 mins $Therapeutic Activity: 8-22 mins   PT G Codes:          Robin Snow 11/15/2013, 9:56 AM  Robin Snow, PT, DPT Acute Rehabilitation Services Pager: [phone]

## 2013-11-15 NOTE — Progress Notes (Signed)
[facility] PROGRESS NOTE  Robin Snow ZOX:[id number] DOB: 04/01/1941 DOA: 11/13/2013 PCP: Burtis Junes, MD  Assessment/Plan  Atrial fibrillation, chronic, has been on digoxin previously but was discontinued because of bradycardia  -  Rate in 80s on telemetry, but increases to 140-150 range with exertion -  Will defer possibility of increasing dilt to cardiology -  not on anticoagulation because of history of GI bleeding  -  Appreciate cardiology input  -  TSH normal, 2-D echo:  Preserved EF.  Right knee pain status post TRK x 2, chronic. -  Very few WBC on arthrocentesis and no crystals -  Culture pending -  Has appointment with pain clinic for next month -  PT eval pending -  Trial of ultram -  Avoid NSAIDS due to hx of GIB/gastritis  Diabetes mellitus  -  Continue Amaryl  -  Continue sliding scale insulin   Dyslipidemia continue simvastatin   Hypertension, low normal,  -  continue clonidine and dilt -  Hold ACEI and HCTZ  GERD Continue Protonix  Diet:  Healthy heart Access:  PIV IVF:  off Proph:  lovenox  Code Status: full Family Communication: patient alone Disposition Plan: to home per patient request, tomorrow pending reevaluation by cardiology and PT eval   Consultants:  Cardiology  Radiology  Procedures:  Fluoro guided knee aspiration on 8/11  CXR  Antibiotics:  none   HPI/Subjective:  Severe pain in right knee (like having leg sawed off).  Not eating well for many months, but denies weight loss.      Objective: Filed Vitals:   11/14/13 2000 11/14/13 2255 11/15/13 0016 11/15/13 0400  BP: 108/51 115/73  121/70  Pulse: 84   159  Temp: 98.1 F (36.7 C)  97.9 F (36.6 C) 98.4 F (36.9 C)  TempSrc: Oral  Oral Oral  Resp: 12   20  Height:      Weight:      SpO2: 99%   98%    Intake/Output Summary (Last 24 hours) at 11/15/13 0809 Last data filed at 11/15/13 0400  Gross per 24 hour  Intake   1230 ml  Output    650  ml  Net    580 ml   Filed Weights   11/13/13 1945  Weight: 105 kg (231 lb 7.7 oz)    Exam:   General:  BF, No acute distress  HEENT:  NCAT, MMM  Cardiovascular:  IRRR, nl S1, S2 no mrg, 2+ pulses, warm extremities  Respiratory:  CTAB, no increased WOB  Abdomen:   NABS, soft, NT/ND  MSK:   Normal tone and bulk, nonpitting edema bilateral feet, right knee warm, TTP, swollen  Neuro:  Grossly intact  Data Reviewed: Basic Metabolic Panel:  Recent Labs Lab 11/13/13 1415 11/13/13 1558 11/14/13 0303  NA 144  --  142  K 4.1  --  4.0  CL 102  --  105  CO2 26  --  24  GLUCOSE 120*  --  91  BUN 12  --  11  CREATININE 1.01 0.93 0.89  CALCIUM 9.7  --  9.0   Liver Function Tests:  Recent Labs Lab 11/14/13 0303  AST 16  ALT 10  ALKPHOS 78  BILITOT 1.2  PROT 7.7  ALBUMIN 2.8*   No results found for this basename: LIPASE, AMYLASE,  in the last 168 hours No results found for this basename: AMMONIA,  in the last 168 hours CBC:  Recent Labs Lab 11/13/13 1415  11/13/13 1558 11/14/13 0303  WBC 7.5 8.1 6.6  NEUTROABS 4.8  --   --   HGB 10.4* 10.4* 9.4*  HCT 31.9* 31.2* 28.7*  MCV 84.4 84.3 84.9  PLT 214 271 241   Cardiac Enzymes:  Recent Labs Lab 11/13/13 1415 11/13/13 1600 11/14/13 0303  TROPONINI <0.30 <0.30 <0.30   BNP (last 3 results)  Recent Labs  11/13/13 1415  PROBNP 683.9*   CBG:  Recent Labs Lab 11/14/13 0858  GLUCAP 106*    Recent Results (from the past 240 hour(s))  MRSA PCR SCREENING     Status: None   Collection Time    11/13/13  7:47 PM      Result Value Ref Range Status   MRSA by PCR NEGATIVE  NEGATIVE Final   Comment:            The GeneXpert MRSA Assay (FDA     approved for NASAL specimens     only), is one component of a     comprehensive MRSA colonization     surveillance program. It is not     intended to diagnose MRSA     infection nor to guide or     monitor treatment for     MRSA infections.     Studies: Dg  Chest Port 1 View  11/13/2013   CLINICAL DATA:  Irregular heartbeat.  EXAM: PORTABLE CHEST - 1 VIEW  COMPARISON:  05/01/2010 and 02/03/2008 as well as chest CT 05/01/2010  FINDINGS: Lungs are somewhat hypoinflated without consolidation or effusion. There is mild prominence of the perihilar markings suggesting a mild degree of vascular congestion. Mild to moderate cardiomegaly is present. There are degenerative changes of the spine with mild curvature of the thoracic spine convex to the right unchanged.  IMPRESSION: Mild to moderate cardiomegaly with suggestion of minimal vascular congestion.   Electronically Signed   By: Elberta Fortis M.D.   On: 11/13/2013 14:32   Dg Fluoro Guide Ndl Plc/bx  11/14/2013   CLINICAL DATA:  Painful, swollen right knee. Remote history of total knee arthroplasty. Abnormal bone scan.  EXAM: Right knee aspiration under fluoroscopy  FLUOROSCOPY TIME:  0 min and 18 seconds  PROCEDURE: Written informed consent was obtained. Discussed complications of bleeding, infection and damage to normal structures.  An appropriate site for arthrocentesis was marked on the patient's skin along the medial aspect of the patella. The site was prepped and draped in the usual sterile fashion and local anesthesia was achieved with 1% xylocaine. A 20 gauge spinal needle was then inserted into the knee joint. I was not able to aspirate any significant fluid. I did inject 5 cc of Omnipaque 180 to document intra-articular location and then instilled 10 cc of sterile normal saline. I aspirated back approximately 8 cc of slightly bloody fluid. This will be sent for appropriate laboratory evaluation.  IMPRESSION: Fluoroscopic guided right knee joint aspiration.   Electronically Signed   By: Loralie Champagne M.D.   On: 11/14/2013 13:59    Scheduled Meds: . aspirin EC  81 mg Oral Daily  . cloNIDine  0.1 mg Oral BID  . diltiazem  180 mg Oral Daily  . enoxaparin (LOVENOX) injection  40 mg Subcutaneous Q24H  .  feeding supplement (GLUCERNA SHAKE)  237 mL Oral BID BM  . furosemide  40 mg Oral Daily  . glimepiride  2 mg Oral QAC breakfast  . oxybutynin  5 mg Oral Daily  . pantoprazole  40 mg Oral  Daily  . potassium chloride  20 mEq Oral Daily  . simvastatin  5 mg Oral q1800   Continuous Infusions:   Principal Problem:   Atrial fibrillation with rapid ventricular response Active Problems:   HYPERTENSION, BENIGN   Knee pain, right    Time spent: 30 min    Elyon Zoll, [facility]  [facility] Pager [phone]. If 7PM-7AM, please contact night-coverage at [email or web address], password [facility] 11/15/2013, 8:09 AM  LOS: 2 days

## 2013-11-16 DIAGNOSIS — R079 Chest pain, unspecified: Secondary | ICD-10-CM

## 2013-11-16 MED ORDER — METOPROLOL TARTRATE 12.5 MG HALF TABLET: 12.5000 mg | ORAL_TABLET | Freq: Two times a day (BID) | ORAL | Status: DC

## 2013-11-16 MED ORDER — DILTIAZEM HCL ER COATED BEADS 180 MG PO CP24: 180.0000 mg | ORAL_CAPSULE | Freq: Every day | ORAL | Status: DC

## 2013-11-16 MED ORDER — TRAMADOL HCL 50 MG PO TABS: 100.0000 mg | ORAL_TABLET | Freq: Four times a day (QID) | ORAL | Status: DC | PRN

## 2013-11-16 NOTE — Progress Notes (Signed)
Occupational Therapy Treatment and Discharge Patient Details Name: Robin Snow MRN: [id number] DOB: 12-05-1940 Today's Date: 11/16/2013    History of present illness Pt admitted with A-Fib and RVR.  She has a h/o bil. TKA and has been been having Rt. knee pain with swelling and redness.  Rt knee was aspirated 11/14/13.  PMH includes:  HTN DM; h/o anemia due to GI bleed    OT comments  This 73 yo female admitted with above presents to acute OT today with all education completed and pt without any concerns about BADLs and has family at home to A prn with IADLs/BADLs.  Follow Up Recommendations  No OT follow up;Supervision - Intermittent    Equipment Recommendations  None recommended by OT       Precautions / Restrictions Precautions Precautions: Fall Precaution Comments: Pt reports that that despite her bad right knee she has not fallen Restrictions Weight Bearing Restrictions: No       Mobility Bed Mobility               General bed mobility comments: Pt up in recliner upon arrival  Transfers Overall transfer level: Needs assistance Equipment used: Rolling walker (2 wheeled) Transfers: Sit to/from Stand Sit to Stand: Min guard (increased time to get to full upright standing)              Balance Overall balance assessment: Needs assistance Sitting-balance support: Feet supported;No upper extremity supported Sitting balance-Leahy Scale: Normal     Standing balance support: Single extremity supported Standing balance-Leahy Scale: Fair                     ADL Overall ADL's : Needs assistance/impaired                     Lower Body Dressing: Set up;Supervision/safety;With adaptive equipment;Sit to/from stand Lexicographer and sock aid)   Toilet Transfer: Min guard;Ambulation;RW (recliner>out door>back to recliner)             General ADL Comments: Pt reports she does not feel that grooming will be any issue, nor with the tub transfer  since her son is putting in a grab bar (she does not feel that she needs to practice. Aquired a reacher and sock aid for patient from the gift shop and then changed out the standard sock aid for a wide one for the patient                Cognition   Behavior During Therapy: [facility] for tasks assessed/performed Overall Cognitive Status: Within Functional Limits for tasks assessed                                    Pertinent Vitals/ Pain       Pain Assessment: 0-10 Pain Score: 7  Pain Location: Rigth knee Pain Descriptors / Indicators: Aching Pain Intervention(s): Repositioned;Patient requesting pain meds-RN notified;Ice applied         Frequency Min 2X/week     Progress Toward Goals  OT Goals(current goals can now be found in the care plan section)  Progress towards OT goals:  (All education completed and patient without any further concerns for BADLs)     Plan Discharge plan remains appropriate       End of Session Equipment Utilized During Treatment: Rolling walker   Activity Tolerance Patient tolerated treatment well   Patient Left  in chair;with call bell/phone within reach   Nurse Communication Patient requests pain meds        Time: 1610-9604 OT Time Calculation (min): 38 min  Charges: OT General Charges $OT Visit: 1 Procedure OT Treatments $Self Care/Home Management : 38-52 mins  Evette Georges [phone] 11/16/2013, 12:49 PM

## 2013-11-16 NOTE — Progress Notes (Signed)
Patient Profile: 73 year old African American female with past medical history of atrial fibrillation, no evidence of CAD by LHC in 2012, hypertension, diabetes, history of diverticulosis, history of anemia secondary to GI bleed requiring transfusion, who was admitted 11/13/13 with atrial fibrillation with RVR.   Subjective: No complaints. Denies any symptoms. Right knee pain is better.   Objective: Vital signs in last 24 hours: Temp:  [98.4 F (36.9 C)-98.5 F (36.9 C)] 98.5 F (36.9 C) (08/13 0500) Pulse Rate:  [82-107] 82 (08/13 0500) Resp:  [18-19] 18 (08/13 0500) BP: (100-125)/(56-93) 100/60 mmHg (08/13 0500) SpO2:  [100 %] 100 % (08/13 0500) Weight:  [244 lb 11.2 oz (110.995 kg)] 244 lb 11.2 oz (110.995 kg) (08/13 0500) Last BM Date: 11/14/13  Intake/Output from previous day: 08/12 0701 - 08/13 0700 In: 240 [P.O.:240] Out: 200 [Urine:200] Intake/Output this shift: Total I/O In: 240 [P.O.:240] Out: -   Medications Current Facility-Administered Medications  Medication Dose Route Frequency Provider Last Rate Last Dose  . acetaminophen (TYLENOL) tablet 650 mg  650 mg Oral Q6H PRN Richarda OverlieNayana Abrol, MD   650 mg at 11/15/13 0434   Or  . acetaminophen (TYLENOL) suppository 650 mg  650 mg Rectal Q6H PRN Richarda OverlieNayana Abrol, MD      . aspirin EC tablet 81 mg  81 mg Oral Daily Richarda OverlieNayana Abrol, MD   81 mg at 11/15/13 0919  . cloNIDine (CATAPRES) tablet 0.1 mg  0.1 mg Oral BID Richarda OverlieNayana Abrol, MD   0.1 mg at 11/15/13 2154  . diltiazem (CARDIZEM CD) 24 hr capsule 180 mg  180 mg Oral Daily Azalee CourseHao Meng, [name]   180 mg at 11/15/13 0920  . enoxaparin (LOVENOX) injection 40 mg  40 mg Subcutaneous Q24H Richarda OverlieNayana Abrol, MD   40 mg at 11/15/13 1650  . feeding supplement (GLUCERNA SHAKE) (GLUCERNA SHAKE) liquid 237 mL  237 mL Oral BID BM Ailene Ardsatherine C Lamberton, RD   237 mL at 11/15/13 1436  . furosemide (LASIX) tablet 40 mg  40 mg Oral Daily Azalee CourseHao Meng, [name]   40 mg at 11/15/13 0919  . glimepiride (AMARYL) tablet 2 mg  2  mg Oral QAC breakfast Richarda OverlieNayana Abrol, MD   2 mg at 11/16/13 0715  . levalbuterol (XOPENEX) nebulizer solution 0.63 mg  0.63 mg Nebulization Q6H PRN Richarda OverlieNayana Abrol, MD      . metoprolol tartrate (LOPRESSOR) tablet 12.5 mg  12.5 mg Oral BID Chrystie NoseKenneth C. Hilty, MD   12.5 mg at 11/15/13 2154  . ondansetron (ZOFRAN) tablet 4 mg  4 mg Oral Q6H PRN Richarda OverlieNayana Abrol, MD       Or  . ondansetron (ZOFRAN) injection 4 mg  4 mg Intravenous Q6H PRN Richarda OverlieNayana Abrol, MD      . oxybutynin (DITROPAN-XL) 24 hr tablet 5 mg  5 mg Oral Daily Richarda OverlieNayana Abrol, MD   5 mg at 11/15/13 0919  . pantoprazole (PROTONIX) EC tablet 40 mg  40 mg Oral Daily Richarda OverlieNayana Abrol, MD   40 mg at 11/15/13 0923  . potassium chloride SA (K-DUR,KLOR-CON) CR tablet 20 mEq  20 mEq Oral Daily Azalee CourseHao Meng, [name]   20 mEq at 11/15/13 0919  . simvastatin (ZOCOR) tablet 5 mg  5 mg Oral q1800 Richarda OverlieNayana Abrol, MD   5 mg at 11/15/13 1730  . traMADol (ULTRAM) tablet 100 mg  100 mg Oral Q6H PRN Renae FickleMackenzie Short, MD   100 mg at 11/15/13 2037    PE: General appearance: alert, cooperative and no distress Lungs: clear to  auscultation bilaterally Heart: irregularly irregular rhythm Extremities: no LEE Pulses: 2+ and symmetric Skin: warm and dry Neurologic: Grossly normal  Lab Results:   Recent Labs  11/13/13 1415 11/13/13 1558 11/14/13 0303  WBC 7.5 8.1 6.6  HGB 10.4* 10.4* 9.4*  HCT 31.9* 31.2* 28.7*  PLT 214 271 241   BMET  Recent Labs  11/13/13 1415 11/13/13 1558 11/14/13 0303  NA 144  --  142  K 4.1  --  4.0  CL 102  --  105  CO2 26  --  24  GLUCOSE 120*  --  91  BUN 12  --  11  CREATININE 1.01 0.93 0.89  CALCIUM 9.7  --  9.0   Cardiac Panel (last 3 results)  Recent Labs  11/13/13 1415 11/13/13 1600 11/14/13 0303  TROPONINI <0.30 <0.30 <0.30    Studies/Results:  2D echo 11/14/13 Study Conclusions  - Left ventricle: The cavity size was normal. Wall thickness was normal. Systolic function was normal. The estimated ejection fraction was in  the range of 55% to 60%. Wall motion was normal; there were no regional wall motion abnormalities. Indeterminant diastolic function (atrial fibrillation). - Aortic valve: There was no stenosis. - Mitral valve: Mildly calcified annulus. There was trivial regurgitation. - Left atrium: The atrium was severely dilated. - Right ventricle: The cavity size was normal. Systolic function was normal. - Right atrium: The atrium was moderately dilated. - Tricuspid valve: Peak RV-RA gradient (S): 24 mm Hg. - Pulmonary arteries: PA peak pressure: 32 mm Hg (S). - Systemic veins: IVC measured 1.8 cm with < 50% respirophasic variation, suggesting RA pressure 8 mmHg.  Impressions:  - The patient was in atrial fibrillation. Normal LV size and systolic function, EF 55-60%. Severe LAE. Normal RV size and systolic function. No significant valvular abnormalities.   Assessment/Plan  Principal Problem:   Atrial fibrillation with rapid ventricular response Active Problems:   HYPERTENSION, BENIGN   Knee pain, right  1. Atrial Fibrillation w/ RVR: resting rate is controlled in the 70s-80s. Continue 180 mg of Cardizem and 12.5 mg of Lopressor BID for rate control. BP is soft but stable in the low 100s systolic. Asymptomatic. No anticoagulation given prior h/o GIBs.     LOS: 3 days    Braelynne Garinger M. Delmer Islam 11/16/2013 8:35 AM

## 2013-11-16 NOTE — Care Management Note (Signed)
    Page 1 of 2   11/16/2013     12:41:51 PM CARE MANAGEMENT NOTE 11/16/2013  Patient:  Robin Snow   Account Number:  [id number]  Date Initiated:  11/16/2013  Documentation initiated by:  Donn PieriniWEBSTER,Aram Domzalski  Subjective/Objective Assessment:   Pt admitted with afib     Action/Plan:   PTA pt lived at home- PT eval   Anticipated DC Date:  11/16/2013   Anticipated DC Plan:  HOME W HOME HEALTH SERVICES      DC Planning Services  CM consult      [facility]  [facility]   Choice offered to / List presented to:  C-1 Patient   DME arranged  NA        HH arranged  HH-2 PT      HH agency  [facility]   Status of service:  Completed, signed off Medicare Important Message given?  YES (If response is "NO", the following Medicare IM given date fields will be blank) Date Medicare IM given:  11/16/2013 Medicare IM given by:  Donn PieriniWEBSTER,Sayre Witherington Date Additional Medicare IM given:   Additional Medicare IM given by:    Discharge Disposition:  HOME W HOME HEALTH SERVICES  Per UR Regulation:  Reviewed for med. necessity/level of care/duration of stay  If discussed at Long Length of Stay Meetings, dates discussed:    Comments:  11/16/13- 1230- Donn PieriniKristi Othar Curto RN, BSN [phone] [facility] in to speak with pt- per conversation pt states that she has needed DME at [facility], shower chair, w/c, canes, and walker. [place] per pt choice [facility]- [facility]Donna [facility] for [facility]-PT- services to start within 24-48 hr post discharge.

## 2013-11-16 NOTE — Progress Notes (Signed)
Pt. Seen and examined. Agree with the NP/PA-C note as written.  HR control is improved. A-fib rate up to 115 with ambulation, which is much better. Would continue current regimen. Cardiology will sign-off. Follow-up with Dr. Clifton JamesMcAlhany after discharge.   Chrystie NoseKenneth C. Hilty, MD, [facility] Attending Cardiologist [facility]

## 2013-11-16 NOTE — Discharge Summary (Signed)
Physician Discharge Summary  Robin Snow MVH:[id number] DOB: [birth year] DOA: 11/13/2013  PCP: Burtis Junes, MD  Admit date: 11/13/2013 Discharge date: 11/16/2013  Recommendations for Outpatient Follow-up:  1. Follow up with cardiology in 2-4 weeks 2. Follow up with primary care doctor in 2 weeks.  If blood pressure will tolerate, please resume ACEI.  Please follow up on final result of synovial culture. 3. Follow up with pain clinic at already scheduled appointment 4. HHPT  Discharge Diagnoses:  Principal Problem:   Atrial fibrillation with rapid ventricular response Active Problems:   HYPERTENSION, BENIGN   Knee pain, right   Discharge Condition: stable, improved  Diet recommendation: healthy heart, diabetic  Wt Readings from Last 3 Encounters:  11/16/13 110.995 kg (244 lb 11.2 oz)  05/31/13 119.659 kg (263 lb 12.8 oz)  01/27/13 108 kg (238 lb 1.6 oz)    History of present illness:  The patient is a 73 yo F with history of atrial fibrillation, hypertension, diabetes, history of diverticulosis, history of anemia secondary to GI bleed requiring transfusion. Patient had a negative cardiac catheterization in 2012 which revealed normal coronaries. She has not followed up with any cardiologist since 2012 and does not remember being on any AV nodal blocking medications.  She was previously on coumadin but this was discontinued due to GI bleed due to diverticulosis and gastritis in Oct of 2014.  She presented with increased with knee pain and swelling to her PCP office and was incidentally found to be in a-fib with RVR so she was referred to the hospital for evaluation and treatment.     Hospital Course:   A-fib with RVR, chronic, was previously on digoxin per cardiology records but this was discontinued secondary to bradycardia.  She was initially tachycardic to the 150s and up to 170s.  She was started on dilt gtt which was transitioned to oral diltiazem when her heart rate  was controlled.  While on oral dilt, her heart rate remained controlled at rest, but increased to 170s with exertion so beta blocker was added.  Her heart rate has been well controlled at rest and with exertion since this was done.  Her ECHO demonstrated a preserved EF without valvular abnormalities.  She was continued on low dose ASA for stroke prevention due to her history of life-threatening GIB last fall.  She should follow up with cardiology in 2-4 weeks for reevaluation.    Right knee pain s/p TRK x 2, chronic but worse.  She underwent arthrocentesis with very few WBC and no crystals.  Her culture is no growth.  She was seen by PT who recommended home health PT.  She was given a trial of ultram and NSAIDs were avoided due to history of gastritis with GIB.  She has an appointment with a pain clinic for next month.  Diabetes mellitus, well controlled and may continue her home medications.  HLD, stable, continued simvastatin  HTN, blood pressure was low normal.  She continued clonidine and was started on BB and dilt.  Her HCTZ and ACEI were discontinued.    GERD, stable, continued PPI.    Consultants:  Cardiology  Radiology Procedures:  Fluoro guided knee aspiration on 8/11  CXR Antibiotics:  none    Discharge Exam: Filed Vitals:   11/16/13 0937  BP: 121/63  Pulse:   Temp:   Resp:    Filed Vitals:   11/15/13 0957 11/15/13 2101 11/16/13 0500 11/16/13 0937  BP: 109/56 125/93 100/60 121/63  Pulse: 107 97  82   Temp: 98.4 F (36.9 C) 98.4 F (36.9 C) 98.5 F (36.9 C)   TempSrc: Oral Oral Oral   Resp: 19 18 18    Height:      Weight:   110.995 kg (244 lb 11.2 oz)   SpO2: 100% 100% 100%     General: BF, No acute distress  HEENT: NCAT, MMM  Cardiovascular: IRRR, HR in 70s-80s, nl S1, S2 no mrg, 2+ pulses, warm extremities  Respiratory: CTAB, no increased WOB  Abdomen: NABS, soft, NT/ND  MSK: Normal tone and bulk, nonpitting edema bilateral feet, right knee warm, TTP,  swollen  Neuro: Grossly intact   Discharge Instructions      Discharge Instructions   Call MD for:  difficulty breathing, headache or visual disturbances    Complete by:  As directed      Call MD for:  extreme fatigue    Complete by:  As directed      Call MD for:  hives    Complete by:  As directed      Call MD for:  persistant dizziness or light-headedness    Complete by:  As directed      Call MD for:  persistant nausea and vomiting    Complete by:  As directed      Call MD for:  severe uncontrolled pain    Complete by:  As directed      Call MD for:  temperature >100.4    Complete by:  As directed      Diet - low sodium heart healthy    Complete by:  As directed      Diet Carb Modified    Complete by:  As directed      Discharge instructions    Complete by:  As directed   You were hospitalized with knee pain and with atrial fibrillation causing very fast heart rate.  Please start taking diltiazem and metoprolol, blood pressure medications which also slow your heart rate.  Please stop your lisinopril and HCTZ for now until directed to restart by your primary care doctor.  Your doctor should follow up on the final results of the tests of the fluid from your knee.  Please use ultram as needed for pain and keep your appointment with the pain clinic.     Increase activity slowly    Complete by:  As directed             Medication List    STOP taking these medications       hydrochlorothiazide 12.5 MG capsule  Commonly known as:  MICROZIDE     lisinopril 20 MG tablet  Commonly known as:  PRINIVIL,ZESTRIL     oxyCODONE-acetaminophen 5-325 MG per tablet  Commonly known as:  PERCOCET/ROXICET      TAKE these medications       acetaminophen 500 MG tablet  Commonly known as:  TYLENOL  Take 500 mg by mouth every 6 (six) hours as needed for mild pain.     aspirin EC 81 MG tablet  Take 81 mg by mouth daily.     cloNIDine 0.1 MG tablet  Commonly known as:  CATAPRES   Take 0.1 mg by mouth 2 (two) times daily.     diclofenac sodium 1 % Gel  Commonly known as:  VOLTAREN  Apply 4 g topically 4 (four) times daily as needed (for pain).     diltiazem 180 MG 24 hr capsule  Commonly known as:  CARDIZEM  CD  Take 1 capsule (180 mg total) by mouth daily.     furosemide 40 MG tablet  Commonly known as:  LASIX  Take 40 mg by mouth daily as needed (swelling).     glimepiride 2 MG tablet  Commonly known as:  AMARYL  Take 2 mg by mouth daily before breakfast.     lovastatin 20 MG tablet  Commonly known as:  MEVACOR  Take 20 mg by mouth at bedtime.     metoprolol tartrate 12.5 mg Tabs tablet  Commonly known as:  LOPRESSOR  Take 0.5 tablets (12.5 mg total) by mouth 2 (two) times daily.     oxybutynin 5 MG 24 hr tablet  Commonly known as:  DITROPAN-XL  Take 5 mg by mouth daily.     pantoprazole 40 MG tablet  Commonly known as:  PROTONIX  Take 40 mg by mouth daily.     potassium chloride SA 20 MEQ tablet  Commonly known as:  K-DUR,KLOR-CON  Take 20 mEq by mouth daily.     traMADol 50 MG tablet  Commonly known as:  ULTRAM  Take 2 tablets (100 mg total) by mouth every 6 (six) hours as needed for moderate pain or severe pain.       Follow-up Information   Follow up with Burtis Junes, MD. Schedule an appointment as soon as possible for a visit in 2 weeks.   Specialty:  Family Medicine   Contact information:   [address] [address] [place] [address] [phone]       Follow up with Verne Carrow, MD In 2 weeks.   Specialty:  Cardiology   Contact information:   [address] [place] [address] [phone]        The results of significant diagnostics from this hospitalization (including imaging, microbiology, ancillary and laboratory) are listed below for reference.    Significant Diagnostic Studies: Dg Chest Port 1 View  11/13/2013   CLINICAL DATA:  Irregular heartbeat.  EXAM: PORTABLE CHEST  - 1 VIEW  COMPARISON:  05/01/2010 and 02/03/2008 as well as chest CT 05/01/2010  FINDINGS: Lungs are somewhat hypoinflated without consolidation or effusion. There is mild prominence of the perihilar markings suggesting a mild degree of vascular congestion. Mild to moderate cardiomegaly is present. There are degenerative changes of the spine with mild curvature of the thoracic spine convex to the right unchanged.  IMPRESSION: Mild to moderate cardiomegaly with suggestion of minimal vascular congestion.   Electronically Signed   By: Elberta Fortis M.D.   On: 11/13/2013 14:32   Dg Fluoro Guide Ndl Plc/bx  11/14/2013   CLINICAL DATA:  Painful, swollen right knee. Remote history of total knee arthroplasty. Abnormal bone scan.  EXAM: Right knee aspiration under fluoroscopy  FLUOROSCOPY TIME:  0 min and 18 seconds  PROCEDURE: Written informed consent was obtained. Discussed complications of bleeding, infection and damage to normal structures.  An appropriate site for arthrocentesis was marked on the patient's skin along the medial aspect of the patella. The site was prepped and draped in the usual sterile fashion and local anesthesia was achieved with 1% xylocaine. A 20 gauge spinal needle was then inserted into the knee joint. I was not able to aspirate any significant fluid. I did inject 5 cc of Omnipaque 180 to document intra-articular location and then instilled 10 cc of sterile normal saline. I aspirated back approximately 8 cc of slightly bloody fluid. This will be sent for appropriate laboratory  evaluation.  IMPRESSION: Fluoroscopic guided right knee joint aspiration.   Electronically Signed   By: Loralie Champagne M.D.   On: 11/14/2013 13:59    Microbiology: Recent Results (from the past 240 hour(s))  MRSA PCR SCREENING     Status: None   Collection Time    11/13/13  7:47 PM      Result Value Ref Range Status   MRSA by PCR NEGATIVE  NEGATIVE Final   Comment:            The GeneXpert MRSA Assay (FDA      approved for NASAL specimens     only), is one component of a     comprehensive MRSA colonization     surveillance program. It is not     intended to diagnose MRSA     infection nor to guide or     monitor treatment for     MRSA infections.  BODY FLUID CULTURE     Status: None   Collection Time    11/14/13 10:35 AM      Result Value Ref Range Status   Specimen Description SYNOVIAL FLUID KNEE RIGHT   Final   Special Requests FLUID   Final   Gram Stain     Final   Value: RARE WBC PRESENT,BOTH PMN AND MONONUCLEAR     NO ORGANISMS SEEN     Performed at [facility]   Culture     Final   Value: NO GROWTH 1 DAY     Performed at [facility]   Report Status PENDING   Incomplete     Labs: Basic Metabolic Panel:  Recent Labs Lab 11/13/13 1415 11/13/13 1558 11/14/13 0303  NA 144  --  142  K 4.1  --  4.0  CL 102  --  105  CO2 26  --  24  GLUCOSE 120*  --  91  BUN 12  --  11  CREATININE 1.01 0.93 0.89  CALCIUM 9.7  --  9.0   Liver Function Tests:  Recent Labs Lab 11/14/13 0303  AST 16  ALT 10  ALKPHOS 78  BILITOT 1.2  PROT 7.7  ALBUMIN 2.8*   No results found for this basename: LIPASE, AMYLASE,  in the last 168 hours No results found for this basename: AMMONIA,  in the last 168 hours CBC:  Recent Labs Lab 11/13/13 1415 11/13/13 1558 11/14/13 0303  WBC 7.5 8.1 6.6  NEUTROABS 4.8  --   --   HGB 10.4* 10.4* 9.4*  HCT 31.9* 31.2* 28.7*  MCV 84.4 84.3 84.9  PLT 214 271 241   Cardiac Enzymes:  Recent Labs Lab 11/13/13 1415 11/13/13 1600 11/14/13 0303  TROPONINI <0.30 <0.30 <0.30   BNP: BNP (last 3 results)  Recent Labs  11/13/13 1415  PROBNP 683.9*   CBG:  Recent Labs Lab 11/14/13 0858  GLUCAP 106*    Time coordinating discharge: 35 minutes  Signed:  Damisha Wolff  [facility] 11/16/2013, 12:19 PM

## 2013-11-17 LAB — BODY FLUID CULTURE: CULTURE: NO GROWTH

## 2013-11-29 ENCOUNTER — Encounter (HOSPITAL_COMMUNITY): Payer: Self-pay

## 2013-11-29 ENCOUNTER — Observation Stay (HOSPITAL_COMMUNITY): Payer: Medicare Other

## 2013-11-29 ENCOUNTER — Observation Stay (HOSPITAL_COMMUNITY)
Admission: EM | Admit: 2013-11-29 | Discharge: 2013-11-30 | Disposition: A | Discharge status: Home / Self Care | Payer: Medicare Other | Attending: Emergency Medicine | Admitting: Emergency Medicine

## 2013-11-29 ENCOUNTER — Emergency Department (HOSPITAL_COMMUNITY): Payer: Medicare Other

## 2013-11-29 DIAGNOSIS — R5381 Other malaise: Secondary | ICD-10-CM | POA: Diagnosis not present

## 2013-11-29 DIAGNOSIS — I482 Chronic atrial fibrillation, unspecified: Secondary | ICD-10-CM

## 2013-11-29 DIAGNOSIS — R209 Unspecified disturbances of skin sensation: Secondary | ICD-10-CM | POA: Insufficient documentation

## 2013-11-29 DIAGNOSIS — R5383 Other fatigue: Secondary | ICD-10-CM | POA: Diagnosis not present

## 2013-11-29 DIAGNOSIS — Z7982 Long term (current) use of aspirin: Secondary | ICD-10-CM | POA: Insufficient documentation

## 2013-11-29 DIAGNOSIS — R072 Precordial pain: Secondary | ICD-10-CM

## 2013-11-29 DIAGNOSIS — R000 Tachycardia, unspecified: Secondary | ICD-10-CM | POA: Insufficient documentation

## 2013-11-29 DIAGNOSIS — Z862 Personal history of diseases of the blood and blood-forming organs and certain disorders involving the immune mechanism: Secondary | ICD-10-CM | POA: Diagnosis not present

## 2013-11-29 DIAGNOSIS — I4891 Unspecified atrial fibrillation: Secondary | ICD-10-CM | POA: Diagnosis not present

## 2013-11-29 DIAGNOSIS — K922 Gastrointestinal hemorrhage, unspecified: Secondary | ICD-10-CM | POA: Diagnosis present

## 2013-11-29 DIAGNOSIS — M129 Arthropathy, unspecified: Secondary | ICD-10-CM | POA: Diagnosis not present

## 2013-11-29 DIAGNOSIS — R079 Chest pain, unspecified: Secondary | ICD-10-CM

## 2013-11-29 DIAGNOSIS — G459 Transient cerebral ischemic attack, unspecified: Secondary | ICD-10-CM | POA: Diagnosis present

## 2013-11-29 DIAGNOSIS — Z8701 Personal history of pneumonia (recurrent): Secondary | ICD-10-CM | POA: Insufficient documentation

## 2013-11-29 DIAGNOSIS — Z87891 Personal history of nicotine dependence: Secondary | ICD-10-CM | POA: Insufficient documentation

## 2013-11-29 DIAGNOSIS — I480 Paroxysmal atrial fibrillation: Secondary | ICD-10-CM

## 2013-11-29 DIAGNOSIS — E119 Type 2 diabetes mellitus without complications: Secondary | ICD-10-CM | POA: Diagnosis not present

## 2013-11-29 DIAGNOSIS — K219 Gastro-esophageal reflux disease without esophagitis: Secondary | ICD-10-CM

## 2013-11-29 DIAGNOSIS — I10 Essential (primary) hypertension: Secondary | ICD-10-CM

## 2013-11-29 DIAGNOSIS — M25561 Pain in right knee: Secondary | ICD-10-CM

## 2013-11-29 DIAGNOSIS — I4821 Permanent atrial fibrillation: Secondary | ICD-10-CM | POA: Diagnosis present

## 2013-11-29 DIAGNOSIS — E118 Type 2 diabetes mellitus with unspecified complications: Secondary | ICD-10-CM

## 2013-11-29 DIAGNOSIS — Z79899 Other long term (current) drug therapy: Secondary | ICD-10-CM | POA: Insufficient documentation

## 2013-11-29 DIAGNOSIS — I517 Cardiomegaly: Secondary | ICD-10-CM

## 2013-11-29 LAB — HEMOGLOBIN A1C
HEMOGLOBIN A1C: 5.7 % — AB (ref <5.7)
Mean Plasma Glucose: 117 mg/dL — ABNORMAL HIGH (ref <117)

## 2013-11-29 LAB — APTT: APTT: 31 s (ref 24–37)

## 2013-11-29 LAB — URINALYSIS, ROUTINE W REFLEX MICROSCOPIC
Bilirubin Urine: NEGATIVE
Glucose, UA: NEGATIVE mg/dL
HGB URINE DIPSTICK: NEGATIVE
Ketones, ur: 15 mg/dL — AB
Nitrite: POSITIVE — AB
PROTEIN: NEGATIVE mg/dL
Specific Gravity, Urine: 1.012 (ref 1.005–1.030)
Urobilinogen, UA: 1 mg/dL (ref 0.0–1.0)
pH: 5.5 (ref 5.0–8.0)

## 2013-11-29 LAB — COMPREHENSIVE METABOLIC PANEL
ALBUMIN: 3.1 g/dL — AB (ref 3.5–5.2)
ALK PHOS: 98 U/L (ref 39–117)
ALT: 13 U/L (ref 0–35)
AST: 24 U/L (ref 0–37)
Anion gap: 15 (ref 5–15)
BILIRUBIN TOTAL: 0.7 mg/dL (ref 0.3–1.2)
BUN: 9 mg/dL (ref 6–23)
CHLORIDE: 101 meq/L (ref 96–112)
CO2: 23 mEq/L (ref 19–32)
Calcium: 9.3 mg/dL (ref 8.4–10.5)
Creatinine, Ser: 0.75 mg/dL (ref 0.50–1.10)
GFR calc Af Amer: >90 mL/min (ref >90)
GFR calc non Af Amer: 81 mL/min — ABNORMAL LOW (ref >90)
Glucose, Bld: 61 mg/dL — ABNORMAL LOW (ref 70–99)
POTASSIUM: 4.3 meq/L (ref 3.7–5.3)
Sodium: 139 mEq/L (ref 137–147)
Total Protein: 9 g/dL — ABNORMAL HIGH (ref 6.0–8.3)

## 2013-11-29 LAB — PROTIME-INR
INR: 1.22 (ref 0.00–1.49)
Prothrombin Time: 15.4 seconds — ABNORMAL HIGH (ref 11.6–15.2)

## 2013-11-29 LAB — GLUCOSE, CAPILLARY
Glucose-Capillary: 124 mg/dL — ABNORMAL HIGH (ref 70–99)
Glucose-Capillary: 175 mg/dL — ABNORMAL HIGH (ref 70–99)

## 2013-11-29 LAB — I-STAT CHEM 8, ED
BUN: 9 mg/dL (ref 6–23)
CALCIUM ION: 1.11 mmol/L — AB (ref 1.13–1.30)
CHLORIDE: 106 meq/L (ref 96–112)
CREATININE: 0.8 mg/dL (ref 0.50–1.10)
Glucose, Bld: 64 mg/dL — ABNORMAL LOW (ref 70–99)
HCT: 35 % — ABNORMAL LOW (ref 36.0–46.0)
Hemoglobin: 11.9 g/dL — ABNORMAL LOW (ref 12.0–15.0)
Potassium: 4.2 mEq/L (ref 3.7–5.3)
Sodium: 138 mEq/L (ref 137–147)
TCO2: 26 mmol/L (ref 0–100)

## 2013-11-29 LAB — CBC
HCT: 31.8 % — ABNORMAL LOW (ref 36.0–46.0)
HEMOGLOBIN: 10.5 g/dL — AB (ref 12.0–15.0)
MCH: 28 pg (ref 26.0–34.0)
MCHC: 33 g/dL (ref 30.0–36.0)
MCV: 84.8 fL (ref 78.0–100.0)
Platelets: 203 10*3/uL (ref 150–400)
RBC: 3.75 MIL/uL — ABNORMAL LOW (ref 3.87–5.11)
RDW: 15.1 % (ref 11.5–15.5)
WBC: 7.5 10*3/uL (ref 4.0–10.5)

## 2013-11-29 LAB — RAPID URINE DRUG SCREEN, HOSP PERFORMED
AMPHETAMINES: NOT DETECTED
Barbiturates: NOT DETECTED
Benzodiazepines: NOT DETECTED
Cocaine: NOT DETECTED
Opiates: NOT DETECTED
TETRAHYDROCANNABINOL: NOT DETECTED

## 2013-11-29 LAB — DIFFERENTIAL
BASOS ABS: 0 10*3/uL (ref 0.0–0.1)
BASOS PCT: 0 % (ref 0–1)
Eosinophils Absolute: 0 10*3/uL (ref 0.0–0.7)
Eosinophils Relative: 0 % (ref 0–5)
Lymphocytes Relative: 22 % (ref 12–46)
Lymphs Abs: 1.7 10*3/uL (ref 0.7–4.0)
MONOS PCT: 8 % (ref 3–12)
Monocytes Absolute: 0.6 10*3/uL (ref 0.1–1.0)
NEUTROS ABS: 5.2 10*3/uL (ref 1.7–7.7)
Neutrophils Relative %: 70 % (ref 43–77)

## 2013-11-29 LAB — ETHANOL

## 2013-11-29 LAB — URINE MICROSCOPIC-ADD ON

## 2013-11-29 LAB — I-STAT TROPONIN, ED: TROPONIN I, POC: 0 ng/mL (ref 0.00–0.08)

## 2013-11-29 MED ORDER — METOPROLOL TARTRATE 12.5 MG HALF TABLET
12.5000 mg | ORAL_TABLET | Freq: Two times a day (BID) | ORAL | Status: DC
Start: 2013-11-29 — End: 2013-11-30
  Administered 2013-11-29 – 2013-11-30 (×3): 12.5 mg via ORAL
  Filled 2013-11-29 (×4): qty 1

## 2013-11-29 MED ORDER — PANTOPRAZOLE SODIUM 40 MG PO TBEC
40.0000 mg | DELAYED_RELEASE_TABLET | Freq: Every day | ORAL | Status: DC
  Administered 2013-11-29 – 2013-11-30 (×2): 40 mg via ORAL
  Filled 2013-11-29 (×2): qty 1

## 2013-11-29 MED ORDER — DEXTROSE 50 % IV SOLN: 25.0000 mL | Freq: Once | INTRAVENOUS | Status: AC | PRN

## 2013-11-29 MED ORDER — TRAMADOL HCL 50 MG PO TABS
100.0000 mg | ORAL_TABLET | Freq: Four times a day (QID) | ORAL | Status: DC | PRN
  Administered 2013-11-29 – 2013-11-30 (×2): 100 mg via ORAL
  Filled 2013-11-29 (×2): qty 2

## 2013-11-29 MED ORDER — GABAPENTIN 100 MG PO CAPS
100.0000 mg | ORAL_CAPSULE | Freq: Every day | ORAL | Status: DC
  Administered 2013-11-29: 100 mg via ORAL
  Filled 2013-11-29 (×2): qty 1

## 2013-11-29 MED ORDER — OXYCODONE-ACETAMINOPHEN 5-325 MG PO TABS
1.0000 | ORAL_TABLET | ORAL | Status: DC | PRN
  Administered 2013-11-29: 1 via ORAL
  Filled 2013-11-29: qty 1

## 2013-11-29 MED ORDER — OXYBUTYNIN CHLORIDE ER 5 MG PO TB24
5.0000 mg | ORAL_TABLET | Freq: Every day | ORAL | Status: DC
  Administered 2013-11-29 – 2013-11-30 (×2): 5 mg via ORAL
  Filled 2013-11-29 (×2): qty 1

## 2013-11-29 MED ORDER — STROKE: EARLY STAGES OF RECOVERY BOOK
Freq: Once | Status: AC
  Administered 2013-11-29: 07:00:00
  Filled 2013-11-29: qty 1

## 2013-11-29 MED ORDER — CLONIDINE HCL 0.1 MG PO TABS
0.1000 mg | ORAL_TABLET | Freq: Two times a day (BID) | ORAL | Status: DC
  Administered 2013-11-29 – 2013-11-30 (×3): 0.1 mg via ORAL
  Filled 2013-11-29 (×4): qty 1

## 2013-11-29 MED ORDER — INSULIN ASPART 100 UNIT/ML ~~LOC~~ SOLN
0.0000 [IU] | Freq: Three times a day (TID) | SUBCUTANEOUS | Status: DC
  Administered 2013-11-30 (×2): 1 [IU] via SUBCUTANEOUS

## 2013-11-29 MED ORDER — ASPIRIN EC 81 MG PO TBEC
81.0000 mg | DELAYED_RELEASE_TABLET | Freq: Every day | ORAL | Status: DC
  Administered 2013-11-29 – 2013-11-30 (×2): 81 mg via ORAL
  Filled 2013-11-29 (×2): qty 1

## 2013-11-29 MED ORDER — DILTIAZEM HCL ER COATED BEADS 180 MG PO CP24
180.0000 mg | ORAL_CAPSULE | Freq: Every day | ORAL | Status: DC
  Administered 2013-11-29 – 2013-11-30 (×2): 180 mg via ORAL
  Filled 2013-11-29 (×2): qty 1

## 2013-11-29 MED ORDER — INSULIN ASPART 100 UNIT/ML ~~LOC~~ SOLN: 0.0000 [IU] | Freq: Every day | SUBCUTANEOUS | Status: DC

## 2013-11-29 MED ORDER — SIMVASTATIN 20 MG PO TABS
20.0000 mg | ORAL_TABLET | Freq: Every day | ORAL | Status: DC
Start: 2013-11-29 — End: 2013-11-29

## 2013-11-29 MED ORDER — ENOXAPARIN SODIUM 40 MG/0.4ML ~~LOC~~ SOLN
40.0000 mg | SUBCUTANEOUS | Status: DC
  Administered 2013-11-29 – 2013-11-30 (×2): 40 mg via SUBCUTANEOUS
  Filled 2013-11-29 (×3): qty 0.4

## 2013-11-29 MED ORDER — OXYCODONE HCL 5 MG PO TABS: 2.5000 mg | ORAL_TABLET | ORAL | Status: DC | PRN

## 2013-11-29 MED ORDER — LISINOPRIL 20 MG PO TABS
20.0000 mg | ORAL_TABLET | Freq: Every day | ORAL | Status: DC
  Administered 2013-11-29 – 2013-11-30 (×2): 20 mg via ORAL
  Filled 2013-11-29 (×2): qty 1

## 2013-11-29 MED ORDER — OXYCODONE-ACETAMINOPHEN 7.5-325 MG PO TABS: 1.0000 | ORAL_TABLET | ORAL | Status: DC | PRN

## 2013-11-29 MED ORDER — ATORVASTATIN CALCIUM 10 MG PO TABS
10.0000 mg | ORAL_TABLET | Freq: Every day | ORAL | Status: DC
Start: 2013-11-29 — End: 2013-11-30
  Administered 2013-11-29: 10 mg via ORAL
  Filled 2013-11-29 (×2): qty 1

## 2013-11-29 NOTE — Evaluation (Addendum)
Physical Therapy Evaluation Patient Details Name: Robin Snow MRN: [id number] DOB: 03-May-1940 Today's Date: 11/29/2013   History of Present Illness  73 y.o. female with a history of A. fib who is not on anticoagulation do to history of GI bleeds from diverticular source. She went to bed in her normal state of health around 9 PM 11/28/2013, and then awoke with inability to move her right arm. She called out to her family called 911 and she was brought in by EMS on 11/29/2013. Since EMS arrival to the scene, she has had significant improvement in her right arm. She still does have some persistent numbness. Patient was not administered TPA secondary to delay in arrival. She was admitted for further evaluation and treatment. MRI brain-Punctate acute infarction at the right inferior pons.    Clinical Impression  Patient presents with functional limitations due to deficits listed in PT problem list (see below). Pt with increased pain in right knee limiting safe mobility. Increased time to perform all transfers. At eval, pt requires Mod A for bed mobility and transfers due to weakness and pain. Pt would benefit from acute PT and follow up ST SNF to improve transfers, gait, balance and overall mobility so pt can maximize independence, ease burden of care and improve quality of life prior to discharge home. Pt wanting to return home at discharge. If pt refuses SNF placement, recommend HHPT and hands on assist for all mobility for safety as pt high falls risk.    Follow Up Recommendations SNF;Supervision/Assistance - 24 hour (Pt refusing SNF placement. If pt goes home, recommend HHPT.)    Equipment Recommendations  None recommended by PT    Recommendations for Other Services       Precautions / Restrictions Precautions Precautions: Fall Precaution Comments: History of bilateral TKA Restrictions Weight Bearing Restrictions: No      Mobility  Bed Mobility Overal bed mobility: Needs  Assistance Bed Mobility: Supine to Sit     Supine to sit: Mod assist;HOB elevated     General bed mobility comments: Increased time to get to EOB. Assist mobilizing RLE to EOB, with trunk and scooting bottom.   Transfers Overall transfer level: Needs assistance Equipment used: Rolling walker (2 wheeled) Transfers: Sit to/from Stand Sit to Stand: Mod assist;From elevated surface         General transfer comment: Stood from EOB x1 with VC for hand/LE placement and for anterior translation. Mod A for hips and trunk to get to standing position due to weakness and pain in RLE. Slow, guarded movement.  Ambulation/Gait Ambulation/Gait assistance: Min guard Ambulation Distance (Feet): 8 Feet Assistive device: Rolling walker (2 wheeled) Gait Pattern/deviations: Step-to pattern;Decreased stride length;Trunk flexed Gait velocity: Decreased   General Gait Details: Slow, guarded antalgic gait pattern secondary to pain in RLE with weightbearing. Poor muscular endurance and pain limiting distance.   Stairs            Wheelchair Mobility    Modified Rankin (Stroke Patients Only) Modified Rankin (Stroke Patients Only) Pre-Morbid Rankin Score: Moderately severe disability Modified Rankin: Moderately severe disability     Balance Overall balance assessment: Needs assistance   Sitting balance-Leahy Scale: Good       Standing balance-Leahy Scale: Poor Standing balance comment: Requires use of BUEs for support due to pain in RLE with weight bearing and balance deficits. Min guard for safety.  Pertinent Vitals/Pain Pain Assessment: 0-10 Pain Score: 8  Pain Location: right knee Pain Descriptors / Indicators: Sharp;Sore;Tender;Discomfort;Grimacing;Guarding Pain Intervention(s): Limited activity within patient's tolerance;Monitored during session;Repositioned    Home Living Family/patient expects to be discharged to:: Private  residence Living Arrangements: Other relatives;Children Available Help at Discharge: Family Type of Home: House Home Access: Stairs to enter Entrance Stairs-Rails: None Entrance Stairs-Number of Steps: 3 Home Layout: Two level;Able to live on main level with bedroom/bathroom [facility] bed on second level.) Home Equipment: Walker - 2 wheels;Cane - single point;Bedside commode;Wheelchair - manual;Tub bench Additional Comments: Most DME was from daughter who had MS. She passed away in 2022/06/02 of this year.     Prior Function Level of Independence: Independent with assistive device(s);Needs assistance   Gait / Transfers Assistance Needed: Pt uses RW for all mobility. Mainly household ambulator. Limited due to chronic pain in Bil knees R>L. Reports getting HHPT prior to admission.  ADL's / Homemaking Assistance Needed: Requires setup to Min A for bathing and dressing.        Hand Dominance   Dominant Hand: Right    Extremity/Trunk Assessment   Upper Extremity Assessment: Defer to OT evaluation RUE Deficits / Details: Pt reports numbness throughout RUE and into finger tips.         Lower Extremity Assessment: Generalized weakness RLE Deficits / Details: Pt with limited AROM - knee flexion, hip flexion secondary to consistent long term guarding of knee due to pain. Decreased strength and increased pain with weight bearing. Increased pain in dependent position.    Cervical / Trunk Assessment: Kyphotic  Communication   Communication: No difficulties  Cognition Arousal/Alertness: Awake/alert Behavior During Therapy: WFL for tasks assessed/performed Overall Cognitive Status: Within Functional Limits for tasks assessed                      General Comments General comments (skin integrity, edema, etc.): Warmth present along anterior right knee.    Exercises        Assessment/Plan    PT Assessment Patient needs continued PT services  PT Diagnosis Difficulty  walking;Generalized weakness;Acute pain   PT Problem List Decreased strength;Pain;Decreased range of motion;Impaired sensation;Decreased activity tolerance;Decreased balance;Decreased safety awareness;Decreased knowledge of use of DME;Decreased mobility;Decreased knowledge of precautions  PT Treatment Interventions DME instruction;Balance training;Gait training;Patient/family education;Functional mobility training;Therapeutic activities;Therapeutic exercise   PT Goals (Current goals can be found in the Care Plan section) Acute Rehab PT Goals Patient Stated Goal: to make her knee pain go away PT Goal Formulation: With patient Time For Goal Achievement: 12/13/13 Potential to Achieve Goals: Good    Frequency Min 3X/week   Barriers to discharge        Co-evaluation               End of Session Equipment Utilized During Treatment: Gait belt Activity Tolerance: Patient limited by pain Patient left: in chair;with call bell/phone within reach;with family/visitor present Nurse Communication: Mobility status         Time: 4098-1191 PT Time Calculation (min): 30 min   Charges:   PT Evaluation $Initial PT Evaluation Tier I: 1 Procedure PT Treatments $Therapeutic Activity: 8-22 mins   PT G CodesAlvie Heidelberg A 11/29/2013, 3:11 PM Alvie Heidelberg, PT, DPT [phone]

## 2013-11-29 NOTE — ED Notes (Addendum)
Code stroke with a LKN 11/28/13 at 2100.  Pt is having right sided weakness, with improvement with EMS.  PT has no speech deficits.  Family discovered the pt today at 03:54.  Pt has a hx of atrial fibrillation.  Pt was taken off of her Coumadin x 1 year ago due to GI bleeding.  CBG 75.  BP 166/90, HR 110, 98% Room Air.

## 2013-11-29 NOTE — ED Provider Notes (Signed)
CSN: [id number]     Arrival date & time 11/29/13  4098 History   First MD Initiated Contact with Patient 11/29/13 [phone]     Chief Complaint  Patient presents with  . Code Stroke     (Consider location/radiation/quality/duration/timing/severity/associated sxs/prior Treatment) HPI  This is a 88 female with a history of atrial fibrillation, hypertension, diabetes who presents as a code stroke. All known normal 9 PM. Patient woke up with right upper extremity weakness and numbness. Notes improvement upon arrival.  She was evaluated upon arrival. ABCs intact. Sent his CT scanner.  Neurology at the bedside.  Patient is not currently on any anticoagulation. History of GI bleeds.  Level V caveat for acuity of condition.  Past Medical History  Diagnosis Date  . Atrial fibrillation   . Hypertension   . Arthritis   . Coagulopathy   . Diabetes mellitus     Type 2  . GI bleed   . Pneumonia     2-3 years ago  . GERD (gastroesophageal reflux disease)   . Anemia   . Hx of transfusion of packed red blood cells   . Diverticulosis   . Anticoagulant drug declined     due to hx GI bleeding   Past Surgical History  Procedure Laterality Date  . Cesarean section    . Replacement total knee bilateral    . Tubal ligation    . Esophagogastroduodenoscopy Left 01/23/2013    Procedure: ESOPHAGOGASTRODUODENOSCOPY (EGD);  Surgeon: Willis Modena, MD;  Location: [facility] ENDOSCOPY;  Service: Endoscopy;  Laterality: Left;  . Colonoscopy Left 01/24/2013    Procedure: COLONOSCOPY;  Surgeon: Willis Modena, MD;  Location: [facility] ENDOSCOPY;  Service: Endoscopy;  Laterality: Left;  [name] Eye surgery Bilateral     cataract surgery  . Joint replacement     Family History  Problem Relation Age of Onset  . Cancer Father    History  Substance Use Topics  . Smoking status: Former Smoker    Types: Cigarettes    Quit date: 07/07/1993  . Smokeless tobacco: Never Used  . Alcohol Use: No     Comment: no longer drinks  alcohol   OB History   Grav Para Term Preterm Abortions TAB SAB Ect Mult Living                 Review of Systems  Constitutional: Negative for fever.  Respiratory: Negative for chest tightness and shortness of breath.   Cardiovascular: Negative for chest pain.  Gastrointestinal: Negative for nausea, vomiting and abdominal pain.  Genitourinary: Negative for dysuria.  Neurological: Positive for weakness and numbness. Negative for dizziness, speech difficulty and headaches.  Psychiatric/Behavioral: Negative for confusion.  All other systems reviewed and are negative.     Allergies  Aspirin; Hydrocodone; and Rofecoxib  Home Medications   Prior to Admission medications   Medication Sig Start Date End Date Taking? Authorizing Provider  acetaminophen (TYLENOL) 500 MG tablet Take 500 mg by mouth every 6 (six) hours as needed for mild pain.   Yes Historical Provider, MD  aspirin EC 81 MG tablet Take 81 mg by mouth daily.   Yes Historical Provider, MD  cloNIDine (CATAPRES) 0.1 MG tablet Take 0.1 mg by mouth 2 (two) times daily.    Yes Historical Provider, MD  diclofenac sodium (VOLTAREN) 1 % GEL Apply 4 g topically 4 (four) times daily as needed (for pain).   Yes Historical Provider, MD  diltiazem (CARDIZEM CD) 180 MG 24 hr capsule Take 1 capsule (  180 mg total) by mouth daily. 11/16/13  Yes Renae Fickle, MD  furosemide (LASIX) 40 MG tablet Take 40 mg by mouth daily as needed (swelling).   Yes Historical Provider, MD  gabapentin (NEURONTIN) 100 MG capsule Take 100 mg by mouth at bedtime.   Yes Historical Provider, MD  glimepiride (AMARYL) 2 MG tablet Take 2 mg by mouth daily before breakfast.   Yes Historical Provider, MD  lisinopril (PRINIVIL,ZESTRIL) 20 MG tablet Take 20 mg by mouth daily.   Yes Historical Provider, MD  lovastatin (MEVACOR) 20 MG tablet Take 20 mg by mouth at bedtime.   Yes Historical Provider, MD  metoprolol tartrate (LOPRESSOR) 12.5 mg TABS tablet Take 0.5 tablets  (12.5 mg total) by mouth 2 (two) times daily. 11/16/13  Yes Renae Fickle, MD  oxybutynin (DITROPAN-XL) 5 MG 24 hr tablet Take 5 mg by mouth daily.   Yes Historical Provider, MD  oxyCODONE-acetaminophen (PERCOCET) 7.5-325 MG per tablet Take 1 tablet by mouth every 4 (four) hours as needed for pain.   Yes Historical Provider, MD  pantoprazole (PROTONIX) 40 MG tablet Take 40 mg by mouth daily.   Yes Historical Provider, MD  potassium chloride SA (K-DUR,KLOR-CON) 20 MEQ tablet Take 20 mEq by mouth daily.    Yes Historical Provider, MD  traMADol (ULTRAM) 50 MG tablet Take 2 tablets (100 mg total) by mouth every 6 (six) hours as needed for moderate pain or severe pain. 11/16/13  Yes Renae Fickle, MD   SpO2 98% Physical Exam  Nursing note and vitals reviewed. Constitutional: She is oriented to person, place, and time. She appears well-developed and well-nourished.  HENT:  Head: Normocephalic and atraumatic.  Eyes: EOM are normal. Pupils are equal, round, and reactive to light.  Neck: Neck supple.  Cardiovascular: Normal heart sounds.   Tachycardia, irregular rhythm  Pulmonary/Chest: Effort normal. No respiratory distress. She has no wheezes.  Abdominal: Soft. Bowel sounds are normal.  Neurological: She is alert and oriented to person, place, and time. No cranial nerve deficit.  No dysmetria to FNF, 4+/5 grip strength right, 5/5 proximal muscle strength, no drift noted  Skin: Skin is warm and dry.  Psychiatric: She has a normal mood and affect.    ED Course  Procedures (including critical care time) Labs Review Labs Reviewed  PROTIME-INR - Abnormal; Notable for the following:    Prothrombin Time 15.4 (*)    All other components within normal limits  CBC - Abnormal; Notable for the following:    RBC 3.75 (*)    Hemoglobin 10.5 (*)    HCT 31.8 (*)    All other components within normal limits  COMPREHENSIVE METABOLIC PANEL - Abnormal; Notable for the following:    Glucose, Bld 61 (*)     Total Protein 9.0 (*)    Albumin 3.1 (*)    GFR calc non Af Amer 81 (*)    All other components within normal limits  I-STAT CHEM 8, ED - Abnormal; Notable for the following:    Glucose, Bld 64 (*)    Calcium, Ion 1.11 (*)    Hemoglobin 11.9 (*)    HCT 35.0 (*)    All other components within normal limits  ETHANOL  APTT  DIFFERENTIAL  URINE RAPID DRUG SCREEN (HOSP PERFORMED)  URINALYSIS, ROUTINE W REFLEX MICROSCOPIC  I-STAT TROPOININ, ED  I-STAT TROPOININ, ED    Imaging Review Ct Head Wo Contrast  11/29/2013   CLINICAL DATA:  Right-sided weakness.  Code stroke.  EXAM: CT  HEAD WITHOUT CONTRAST  TECHNIQUE: Contiguous axial images were obtained from the base of the skull through the vertex without intravenous contrast.  COMPARISON:  None.  FINDINGS: Mild cerebral atrophy. Low-attenuation changes in the deep white matter consistent with small vessel ischemia. No mass effect or midline shift. No abnormal extra-axial fluid collections. Gray-white matter junctions are distinct. Basal cisterns are not effaced. No evidence of acute intracranial hemorrhage. No depressed skull fractures. Visualized paranasal sinuses and mastoid air cells are not opacified. Vascular calcifications.  IMPRESSION: No acute intracranial abnormalities. Chronic atrophy and small vessel ischemic changes.  Code stroke results were called by telephone at the time of interpretation on 11/29/2013 at 4:44 am to Dr. Wayland Salinas , who verbally acknowledged these results.   Electronically Signed   By: Burman Nieves M.D.   On: 11/29/2013 04:46     EKG Interpretation   Date/Time:  Wednesday November 29 2013 04:56:17 EDT Ventricular Rate:  120 PR Interval:    QRS Duration: 88 QT Interval:  325 QTC Calculation: 459 R Axis:   -7 Text Interpretation:  Atrial fibrillation Confirmed by HORTON  MD,  Toni Amend ([id number]) on 11/29/2013 5:09:37 AM      MDM   Final diagnoses:  None    Patient presents as a code stroke. Last seen  normal at 9 PM. Reports right upper extremity weakness and numbness. States that it is getting better.  Not within a TPA window. CT reassuring. She does have a history of atrial fibrillation is not anticoagulated.  No known history of stroke. Basic labwork obtained. Patient evaluated by neurology and will admit to hospitalist for TIA workup.    Shon Baton, MD 11/29/13 [phone]

## 2013-11-29 NOTE — Progress Notes (Signed)
Admitted earlier today for right-sided weakness which has resolved, does have atrial fibrillation, not on anticoagulation due to recurrent GI bleed. Symptoms have resolved this could have been a TIA versus a small stroke. Full stroke workup initiated. Neurology following.

## 2013-11-29 NOTE — Progress Notes (Signed)
UR completed 

## 2013-11-29 NOTE — Progress Notes (Signed)
Echocardiogram 2D Echocardiogram has been performed.  Dorothey Baseman 11/29/2013, 8:50 AM

## 2013-11-29 NOTE — H&P (Signed)
[facility] History and Physical  Patient: Robin Snow  ZOX:[id number]  DOB: 10-01-1940  DOS: the patient was seen and examined on 11/29/2013 PCP: Burtis Junes, MD  Chief Complaint: Right-sided weakness  HPI: Robin Snow is a 73 y.o. female with Past medical history of A. fib, hypertension, diabetes, GI bleeding, GERD. The patient presented with complaints of right-sided weakness. Symptoms started when she was in her bed and was not able to move her right arm. When she went to sleep at 9 PM she was at her baseline. After the right-sided weakness was discovered she informed as to her family who called 911 and the patient was brought to the hospital. At the time of her arrival according stroke was called but since she was out of the window period no TPA was given.  The patient is coming from home. And at her baseline independent for most of her ADL.  Review of Systems: as mentioned in the history of present illness.  A Comprehensive review of the other systems is negative.  Past Medical History  Diagnosis Date  . Atrial fibrillation   . Hypertension   . Arthritis   . Coagulopathy   . Diabetes mellitus     Type 2  . GI bleed   . Pneumonia     2-3 years ago  . GERD (gastroesophageal reflux disease)   . Anemia   . Hx of transfusion of packed red blood cells   . Diverticulosis   . Anticoagulant drug declined     due to hx GI bleeding   Past Surgical History  Procedure Laterality Date  . Cesarean section    . Replacement total knee bilateral    . Tubal ligation    . Esophagogastroduodenoscopy Left 01/23/2013    Procedure: ESOPHAGOGASTRODUODENOSCOPY (EGD);  Surgeon: Willis Modena, MD;  Location: [facility] ENDOSCOPY;  Service: Endoscopy;  Laterality: Left;  . Colonoscopy Left 01/24/2013    Procedure: COLONOSCOPY;  Surgeon: Willis Modena, MD;  Location: [facility] ENDOSCOPY;  Service: Endoscopy;  Laterality: Left;  [name] Eye surgery Bilateral     cataract surgery  . Joint  replacement     Social History:  reports that she quit smoking about 20 years ago. Her smoking use included Cigarettes. She smoked 0.00 packs per day. She has never used smokeless tobacco. She reports that she does not drink alcohol or use illicit drugs.  Allergies  Allergen Reactions  . Aspirin Hives  . Hydrocodone Hives  . Rofecoxib Hives    Family History  Problem Relation Age of Onset  . Cancer Father     Prior to Admission medications   Medication Sig Start Date End Date Taking? Authorizing Provider  acetaminophen (TYLENOL) 500 MG tablet Take 500 mg by mouth every 6 (six) hours as needed for mild pain.   Yes Historical Provider, MD  aspirin EC 81 MG tablet Take 81 mg by mouth daily.   Yes Historical Provider, MD  cloNIDine (CATAPRES) 0.1 MG tablet Take 0.1 mg by mouth 2 (two) times daily.    Yes Historical Provider, MD  diclofenac sodium (VOLTAREN) 1 % GEL Apply 4 g topically 4 (four) times daily as needed (for pain).   Yes Historical Provider, MD  diltiazem (CARDIZEM CD) 180 MG 24 hr capsule Take 1 capsule (180 mg total) by mouth daily. 11/16/13  Yes Renae Fickle, MD  furosemide (LASIX) 40 MG tablet Take 40 mg by mouth daily as needed (swelling).   Yes Historical Provider, MD  gabapentin (NEURONTIN) 100  MG capsule Take 100 mg by mouth at bedtime.   Yes Historical Provider, MD  glimepiride (AMARYL) 2 MG tablet Take 2 mg by mouth daily before breakfast.   Yes Historical Provider, MD  lisinopril (PRINIVIL,ZESTRIL) 20 MG tablet Take 20 mg by mouth daily.   Yes Historical Provider, MD  lovastatin (MEVACOR) 20 MG tablet Take 20 mg by mouth at bedtime.   Yes Historical Provider, MD  metoprolol tartrate (LOPRESSOR) 12.5 mg TABS tablet Take 0.5 tablets (12.5 mg total) by mouth 2 (two) times daily. 11/16/13  Yes Renae Fickle, MD  oxybutynin (DITROPAN-XL) 5 MG 24 hr tablet Take 5 mg by mouth daily.   Yes Historical Provider, MD  oxyCODONE-acetaminophen (PERCOCET) 7.5-325 MG per tablet  Take 1 tablet by mouth every 4 (four) hours as needed for pain.   Yes Historical Provider, MD  pantoprazole (PROTONIX) 40 MG tablet Take 40 mg by mouth daily.   Yes Historical Provider, MD  potassium chloride SA (K-DUR,KLOR-CON) 20 MEQ tablet Take 20 mEq by mouth daily.    Yes Historical Provider, MD  traMADol (ULTRAM) 50 MG tablet Take 2 tablets (100 mg total) by mouth every 6 (six) hours as needed for moderate pain or severe pain. 11/16/13  Yes Renae Fickle, MD    Physical Exam: Filed Vitals:   11/29/13 0530 11/29/13 0536 11/29/13 0545 11/29/13 0600  BP: 136/90  141/98 145/75  Pulse: 101  86 95  Temp:  99.3 F (37.4 C)    Resp: SpO2: 99%  99% 99%    General: Alert, Awake and Oriented to Time, Place and Person. Appear in mild distress Eyes: PERRL ENT: Oral Mucosa clear moist. Neck: no JVD Cardiovascular: S1 and S2 Present, aortic systolic Murmur, Peripheral Pulses Present Respiratory: Bilateral Air entry equal and Decreased, Clear to Auscultation, noCrackles, no wheezes Abdomen: Bowel Sound Present, Soft and Non tender Skin: no Rash Extremities: Trace Pedal edema, no calf tenderness Neurologic: Grossly no focal neuro deficit, other than mild right-sided numbness  Labs on Admission:  CBC:  Recent Labs Lab 11/29/13 0432 11/29/13 0442  WBC 7.5  --   NEUTROABS 5.2  --   HGB 10.5* 11.9*  HCT 31.8* 35.0*  MCV 84.8  --   PLT 203  --     CMP     Component Value Date/Time   NA 138 11/29/2013 0442   K 4.2 11/29/2013 0442   CL 106 11/29/2013 0442   CO2 23 11/29/2013 0432   GLUCOSE 64* 11/29/2013 0442   BUN 9 11/29/2013 0442   CREATININE 0.80 11/29/2013 0442   CALCIUM 9.3 11/29/2013 0432   PROT 9.0* 11/29/2013 0432   ALBUMIN 3.1* 11/29/2013 0432   AST 24 11/29/2013 0432   ALT 13 11/29/2013 0432   ALKPHOS 98 11/29/2013 0432   BILITOT 0.7 11/29/2013 0432   GFRNONAA 81* 11/29/2013 0432   GFRAA >90 11/29/2013 0432    No results found for this basename: LIPASE, AMYLASE,   in the last 168 hours No results found for this basename: AMMONIA,  in the last 168 hours  No results found for this basename: CKTOTAL, CKMB, CKMBINDEX, TROPONINI,  in the last 168 hours BNP (last 3 results)  Recent Labs  11/13/13 1415  PROBNP 683.9*    Radiological Exams on Admission: Ct Head Wo Contrast  11/29/2013   CLINICAL DATA:  Right-sided weakness.  Code stroke.  EXAM: CT HEAD WITHOUT CONTRAST  TECHNIQUE: Contiguous axial images were obtained from the base of  the skull through the vertex without intravenous contrast.  COMPARISON:  None.  FINDINGS: Mild cerebral atrophy. Low-attenuation changes in the deep white matter consistent with small vessel ischemia. No mass effect or midline shift. No abnormal extra-axial fluid collections. Gray-white matter junctions are distinct. Basal cisterns are not effaced. No evidence of acute intracranial hemorrhage. No depressed skull fractures. Visualized paranasal sinuses and mastoid air cells are not opacified. Vascular calcifications.  IMPRESSION: No acute intracranial abnormalities. Chronic atrophy and small vessel ischemic changes.  Code stroke results were called by telephone at the time of interpretation on 11/29/2013 at 4:44 am to Dr. Wayland Salinas , who verbally acknowledged these results.   Electronically Signed   By: Burman Nieves M.D.   On: 11/29/2013 04:46    EKG: Independently reviewed. normal sinus rhythm, nonspecific ST and T waves changes. Assessment/Plan Principal Problem:   TIA (transient ischemic attack) Active Problems:   HYPERTENSION, BENIGN   Atrial fibrillation   GI bleed   GERD (gastroesophageal reflux disease)   Diabetes mellitus, type 2   1. TIA (transient ischemic attack) The patient is presenting With complaints of right-sided weakness. Her symptoms of weakness has improved significantly but she continues to complain of some numbness. CT of the head is negative for any acute abnormality. Patient has A. fib but is  not on any anticoagulation due to her history of GI bleed in February. With this neurology has been evaluated the patient and recommend further workup. We will obtain MRI brain echocardiogram carotid Doppler. Currently continue with aspirin as per neurology recommendation. PTOT and speech consultation.  2. Hypertension. At present stable continue home medication.  3. PSA. At present mildly elevated heart rate. Continue home medication monitor on telemetry.  4. Diabetes mellitus. Continue with sliding scale.  Consults: neurology  DVT Prophylaxis: subcutaneous Heparin Nutrition: npo  Code Status: full  Disposition: Admitted to observation in telemetry unit.  Author: Lynden Oxford, MD [facility] Pager: [phone] 11/29/2013, 6:29 AM    If 7PM-7AM, please contact night-coverage [email or web address] Password [email or web address]  **Disclaimer: This note may have been dictated with voice recognition software. Similar sounding words can inadvertently be transcribed and this note may contain transcription errors which may not have been corrected upon publication of note.**

## 2013-11-29 NOTE — Evaluation (Signed)
Occupational Therapy Evaluation Patient Details Name: Robin Snow MRN: [id number] DOB: 01/13/1941 Today's Date: 11/29/2013    History of Present Illness 73 y.o. female with a history of A. fib who is not on anticoagulation do to history of GI bleeds from diverticular source. She went to bed in her normal state of health around 9 PM 11/28/2013, and then awoke with inability to move her right arm. She called out to her family called 911 and she was brought in by EMS on 11/29/2013. Since EMS arrival to the scene, she has had significant improvement in her right arm. She still does have some persistent numbness. Patient was not administered TPA secondary to delay in arrival. She was admitted for further evaluation and treatment.   Clinical Impression   Pt with greater limitations in ADL function this admission compared to baseline.  She currently needs min to mod assist for functional transfers and selfcare sit to stand.  She has history of bilateral knee replacements and subsequent ongoing pain.  Now with new right shoulder pain which she reports since approximately one month ago.  AROM shoulder flexion 0-80 degrees with pt reporting increased pain in the anterior aspect of the shoulder.  Feel an X-ray may be beneficial to help with diagnosis and treatment as well.  Will continue to follow for acute care OT needs and recommend HHOT to help further progress independence.  Feel pt will need 24 hour supervision/assistance at discharge which her and her son both report is available.      Follow Up Recommendations  Home health OT    Equipment Recommendations  None recommended by OT       Precautions / Restrictions Precautions Precautions: Fall Precaution Comments: History of bilateral TKA Restrictions Weight Bearing Restrictions: No      Mobility Bed Mobility Overal bed mobility: Needs Assistance Bed Mobility: Supine to Sit     Supine to sit: Mod assist        Transfers Overall  transfer level: Needs assistance Equipment used: Rolling walker (2 wheeled) Transfers: Stand Pivot Transfers Sit to Stand: Mod assist Stand pivot transfers: Mod assist       General transfer comment: Pt moves slowly and guarded maintaining flexed trunk.    Balance Overall balance assessment: Needs assistance   Sitting balance-Leahy Scale: Good     Standing balance support: Bilateral upper extremity supported Standing balance-Leahy Scale: Fair                              ADL Overall ADL's : Needs assistance/impaired Eating/Feeding: Independent   Grooming: Wash/dry hands;Sitting;Supervision/safety   Upper Body Bathing: Minimal assitance;Sitting   Lower Body Bathing: Moderate assistance;Sit to/from stand   Upper Body Dressing : Minimal assistance;Sitting   Lower Body Dressing: Moderate assistance;Sit to/from stand   Toilet Transfer: BSC;Stand-pivot;Moderate assistance   Toileting- Clothing Manipulation and Hygiene: Moderate assistance;Sit to/from stand         General ADL Comments: Pt with history of bilateral knee pain and now with right shoulder pain and decreased AROM.  Pt needs mod assist for sit to stand from lowered bed and from 3:1 but can be min assist at times.          Perception Perception Perception Tested?: No   Praxis Praxis Praxis tested?: Not tested    Pertinent Vitals/Pain Pain Assessment: 0-10 Pain Score: 7  Pain Descriptors / Indicators: Headache;Throbbing;Stabbing Pain Intervention(s): Limited activity within patient's tolerance;RN gave pain  meds during session;Relaxation     Hand Dominance Right   Extremity/Trunk Assessment Upper Extremity Assessment Upper Extremity Assessment: RUE deficits/detail RUE Deficits / Details: Pt with limited AROM shoulder flexion 0-80 degrees with pain.  Pt demonstrates increased anterior shoulder pain with flexion and internal rotation.  Elbow flexion/extension as well as digit AROM and  strength WFLs.   Lower Extremity Assessment Lower Extremity Assessment: Defer to PT evaluation   Cervical / Trunk Assessment Cervical / Trunk Assessment: Kyphotic   Communication Communication Communication: No difficulties   Cognition Arousal/Alertness: Awake/alert Behavior During Therapy: WFL for tasks assessed/performed Overall Cognitive Status: Within Functional Limits for tasks assessed                                Home Living Family/patient expects to be discharged to:: Private residence Living Arrangements: Other relatives;Children Available Help at Discharge: Family Type of Home: House Home Access: Stairs to enter Entrance Stairs-Number of Steps: 3   Home Layout: Two level;Able to live on main level with bedroom/bathroom     Bathroom Shower/Tub: Tub/shower unit;Curtain Shower/tub characteristics: Engineer, building services: Standard     Home Equipment: Environmental consultant - 2 wheels;Cane - single point;Bedside commode;Wheelchair - manual;Tub bench   Additional Comments: Most DME was from daughter who had MS. She passed away in 05-11-22 of this year.       Prior Functioning/Environment Level of Independence: Independent with assistive device(s)        Comments: Pt needs setup  to min assist sometimes for bathing and dressing    OT Diagnosis: Generalized weakness;Acute pain   OT Problem List: Decreased strength;Decreased activity tolerance;Impaired balance (sitting and/or standing);Decreased knowledge of use of DME or AE;Pain;Impaired UE functional use;Decreased range of motion   OT Treatment/Interventions: Self-care/ADL training;Therapeutic exercise;Neuromuscular education;Balance training;Patient/family education;Modalities;Therapeutic activities;DME and/or AE instruction    OT Goals(Current goals can be found in the care plan section) Acute Rehab OT Goals Patient Stated Goal: To get her arm and knees feeling better. OT Goal Formulation: With  patient/family Time For Goal Achievement: 12/13/13 Potential to Achieve Goals: Good  OT Frequency: Min 2X/week   Barriers to D/C:            Co-evaluation              End of Session Equipment Utilized During Treatment: Engineer, water Communication: Mobility status  Activity Tolerance: Patient tolerated treatment well Patient left: in chair;with call bell/phone within reach;with family/visitor present   Time: 1000-1049 OT Time Calculation (min): 49 min Charges:  OT General Charges $OT Visit: 1 Procedure OT Evaluation $Initial OT Evaluation Tier I: 1 Procedure OT Treatments $Self Care/Home Management : 38-52 mins G-Codes: OT G-codes **NOT FOR INPATIENT CLASS** Functional Assessment Tool Used: clinical judgement Functional Limitation: Self care Self Care Current Status (W2956): At least 40 percent but less than 60 percent impaired, limited or restricted Self Care Goal Status (O1308): At least 1 percent but less than 20 percent impaired, limited or restricted  Chasten Blaze OTR/L 11/29/2013, 11:06 AM

## 2013-11-29 NOTE — Progress Notes (Signed)
STROKE TEAM PROGRESS NOTE   HISTORY Robin Snow is a 73 y.o. female with a history of A. fib who is not on anticoagulation do to history of GI bleeds from diverticular source. She went to bed in her normal state of health around 9 PM 11/28/2013, and then awoke with inability to move her right arm. She called out to her family called 911 and she was brought in by EMS on 11/29/2013. Since EMS arrival to the scene, she has had significant improvement in her right arm. She still does have some persistent numbness. Patient was not administered TPA secondary to delay in arrival. She was admitted for further evaluation and treatment.   SUBJECTIVE (INTERVAL HISTORY) Her  RN is at the bedside.  Overall she feels her condition is gradually improving. . She has history of diverticular bleed x 2 while  on warfarin which has been discontinued. She had an colonoscopy but no culprit lesion was found. She has done well on aspirin without bleeding.   OBJECTIVE Temp:  [99.3 F (37.4 C)] 99.3 F (37.4 C) (08/26 0536) Pulse Rate:  [78-108] 78 (08/26 0847) Cardiac Rhythm:  [-] Atrial fibrillation (08/26 0518) Resp:  [12-23] 18 (08/26 0847) BP: (136-158)/(72-98) 151/85 mmHg (08/26 0847) SpO2:  [98 %-99 %] 99 % (08/26 0847) Weight:  [105.008 kg (231 lb 8 oz)] 105.008 kg (231 lb 8 oz) (08/26 1191)  No results found for this basename: GLUCAP,  in the last 168 hours  Recent Labs Lab 11/29/13 0432 11/29/13 0442  NA 139 138  K 4.3 4.2  CL 101 106  CO2 23  --   GLUCOSE 61* 64*  BUN 9 9  CREATININE 0.75 0.80  CALCIUM 9.3  --     Recent Labs Lab 11/29/13 0432  AST 24  ALT 13  ALKPHOS 98  BILITOT 0.7  PROT 9.0*  ALBUMIN 3.1*    Recent Labs Lab 11/29/13 0432 11/29/13 0442  WBC 7.5  --   NEUTROABS 5.2  --   HGB 10.5* 11.9*  HCT 31.8* 35.0*  MCV 84.8  --   PLT 203  --    No results found for this basename: CKTOTAL, CKMB, CKMBINDEX, TROPONINI,  in the last 168 hours  Recent Labs   11/29/13 0432  LABPROT 15.4*  INR 1.22    Recent Labs  11/29/13 0520  COLORURINE YELLOW  LABSPEC 1.012  PHURINE 5.5  GLUCOSEU NEGATIVE  HGBUR NEGATIVE  BILIRUBINUR NEGATIVE  KETONESUR 15*  PROTEINUR NEGATIVE  UROBILINOGEN 1.0  NITRITE POSITIVE*  LEUKOCYTESUR TRACE*       Component Value Date/Time   CHOL  Value: 142        ATP III CLASSIFICATION:  <200     mg/dL   Desirable  478-295  mg/dL   Borderline High  >=621    mg/dL   High        06/11/6576 0152   TRIG 111 05/02/2010 0152   HDL 31* 05/02/2010 0152   CHOLHDL 4.6 05/02/2010 0152   VLDL 22 05/02/2010 0152   LDLCALC  Value: 89        Total Cholesterol/HDL:CHD Risk Coronary Heart Disease Risk Table                     Men   Women  1/2 Average Risk   3.4   3.3  Average Risk       5.0   4.4  2 X Average Risk   9.6  7.1  3 X Average Risk  23.4   11.0        Use the calculated Patient Ratio above and the CHD Risk Table to determine the patient's CHD Risk.        ATP III CLASSIFICATION (LDL):  <100     mg/dL   Optimal  161-096  mg/dL   Near or Above                    Optimal  130-159  mg/dL   Borderline  045-409  mg/dL   High  >811     mg/dL   Very High 12/19/7827 5621   Lab Results  Component Value Date   HGBA1C  Value: 6.0 (NOTE)                                                                       According to the ADA Clinical Practice Recommendations for 2011, when HbA1c is used as a screening test:   >=6.5%   Diagnostic of Diabetes Mellitus           (if abnormal result  is confirmed)  5.7-6.4%   Increased risk of developing Diabetes Mellitus  References:Diagnosis and Classification of Diabetes Mellitus,Diabetes Care,2011,34(Suppl 1):S62-S69 and Standards of Medical Care in         Diabetes - 2011,Diabetes Care,2011,34  (Suppl 1):S11-S61.* 05/01/2010      Component Value Date/Time   LABOPIA NONE DETECTED 11/29/2013 0520   COCAINSCRNUR NONE DETECTED 11/29/2013 0520   LABBENZ NONE DETECTED 11/29/2013 0520   AMPHETMU NONE DETECTED  11/29/2013 0520   THCU NONE DETECTED 11/29/2013 0520   LABBARB NONE DETECTED 11/29/2013 0520     Recent Labs Lab 11/29/13 0432  ETH <11    No results found.   PHYSICAL EXAM Pleasant elderly African American lady not in distress.Awake alert. Afebrile. Head is nontraumatic. Neck is supple without bruit. Hearing is normal. Cardiac exam no murmur or gallop. Lungs are clear to auscultation. Distal pulses are well felt. Neurological Exam : Awake alert oriented x 3 normal speech and language. Mild rightlower face asymmetry. Tongue midline. No drift. Mild diminished fine finger movements on right Orbits left over right upper extremity. Mild right grip weak.. Normal sensation . Normal coordination. ASSESSMENT/PLAN  [name] is a 73 y.o. female with hx of atrial fibrillation not an anticoagulation secondary to hx GIB from diverticular source presenting with right arm hemiparesis. She did not receive IV t-PA due to delay in arrival. Imaging pending, suspect a left brain subcortical infarct. Stroke work up underway.  Probable Stroke/TIA:  Probable left brain infarct embolic secondary to known atrial fibrillation, not on anticoagulation     aspirin 81 mg orally every day prior to admission, now on aspirin 81 mg orally every day. Recommend eliquis 5 mg bid. Per AVERROES trial - eliquis bleeding risk comparable to aspirin.   MRI pending  MRA pending  2D Echo  pending   Carotid pending  Lovenox 40 mg sq daily for VTE prophylaxis  Carb Control thin liquids.   Bedrest with Bathroom privileges-> Up with assistance  Resultant right hemiparesis resolved  Therapy needs:  evals pending  Patient counseled to be compliant with her antithrombotic/anticoagulant medications by  Dr. Pearlean Brownie to patient and son  Disposition:  pending  Hypertension   Home meds:  Catapress, diltiazem, lasix, lisinopril, metoprolol  BP 136-158/72-85 past 24h  SBP goal < 220 from stroke  standpoint  Stable  Patient counseled to be compliant with her blood pressure medications  Hyperlipidemia  LDL pending.   Patient on mevacor 20 at home, changed to lipitor 20 in hospital  LDL goal < 100 (<70 for diabetics)  Diabetes  HgbA1c 5.7  On amaryl at home  Controlled   Goal < 7.0   Educated patient about lifestyle changes for diabetes prevention  Other Stroke Risk Factors Advanced age   Obesity, Body mass index is 36.25 kg/(m^2).   Other Pertinent History  GIB on anticoagulation, diverticular source  Hospital day # 0  I have personally examined this patient, reviewed notes, independently viewed imaging studies, participated in medical decision making and plan of care. I have made any additions or clarifications directly to the above note. Agree with note above.  Complex situation with patient having GI bleeding on warfarin and having recurrent stroke on aspirin. She may need eliquis given safer bleeding profile compared to warfarin and stability of her aspirin. Patient's care requires medical decision making of high complexity. Delia Heady, MD Medical Director [facility] Pager: [phone] 11/29/2013 3:39 PM     To contact Stroke Continuity provider, please refer to [email or web address]. After hours, contact General Neurology

## 2013-11-29 NOTE — Consult Note (Signed)
Neurology Consultation Reason for Consult: Right arm weakness Referring Physician: Horton, C.  CC: Right arm weakness  History is obtained from: Patient  HPI: Robin Snow is a 73 y.o. female with a history of A. fib who is not on anticoagulation do to history of GI bleeds from diverticular source. She went to bed in her normal state of health around 9 PM, and then awoke with inability to move her right arm. She called out to her family called 911 and she was brought in by EMS. Since EMS arrival to the scene, she has had significant improvement in her right arm. She still does have some persistent numbness.   LKW: 9 PM tpa given?: no, outside of window    ROS: A 14 point ROS was performed and is negative except as noted in the HPI.   Past Medical History  Diagnosis Date  . Atrial fibrillation   . Hypertension   . Arthritis   . Coagulopathy   . Diabetes mellitus     Type 2  . GI bleed   . Pneumonia     2-3 years ago  . GERD (gastroesophageal reflux disease)   . Anemia   . Hx of transfusion of packed red blood cells   . Diverticulosis   . Anticoagulant drug declined     due to hx GI bleeding    Family History: Sales promotion account executive  Social History: Tob: Former smoker  Exam: Current vital signs: BP 136/76  Pulse 91  Resp 17  SpO2 98% Vital signs in last 24 hours: Pulse Rate:  [91] 91 (08/26 0515) Resp:  [17] 17 (08/26 0515) BP: (136-158)/(72-76) 136/76 mmHg (08/26 0515) SpO2:  [98 %-99 %] 98 % (08/26 0515)  General: In bed, NAD CV: Regular rate and rhythm Mental Status: Patient is awake, alert, oriented to person, place, month, year, and situation. Immediate and remote memory are intact. Patient is able to give a clear and coherent history. No signs of aphasia or neglect Cranial Nerves: II: Visual Fields are full. Pupils are equal, round, and reactive to light.  Discs are difficult to visualize. III,IV, VI: EOMI without ptosis or diploplia.  V: Facial  sensation is diminished on right VII: Facial movement is symmetric.  VIII: hearing is intact to voice X: Uvula elevates symmetrically XI: Shoulder shrug is symmetric. XII: tongue is midline without atrophy or fasciculations.  Motor: Tone is normal. Bulk is normal. 5/5 strength was present in all four extremities possible exception of a mild right distal arm weakness Sensory: Sensation is diminished in the right face arm and leg Deep Tendon Reflexes: 2+ and symmetric in the biceps and patellae.  Cerebellar: Intentional tremor bilaterally Gait: Not assessed due to acute nature of evaluation and multiple medical monitors in ED setting.     I have reviewed labs in epic and the results pertinent to this consultation are: Glucose 75 by EMS  I have reviewed the images obtained: CT head-no acute findings  Impression: 73 year old female with a history of atrial fibrillation on anticoagulation do to diverticular bleeding who presents with TIA.  Recommendations: 1. HgbA1c, fasting lipid panel 2. MRI, MRA  of the brain without contrast 3. Frequent neuro checks 4. Echocardiogram 5. Carotid dopplers 6. Prophylactic therapy-Antiplatelet med: Aspirin - dose  PO or  PR 7. Risk factor modification 8. Telemetry monitoring 9. PT consult, OT consult, Speech consult 10. I would consider discussing with GI to help stratify her risk for bleeding to determine if it would be reasonable  to restart anticoagulation.   Ritta Slot, MD [facility] [phone]  If 7pm- 7am, please page neurology on call as listed in AMION.

## 2013-11-29 NOTE — ED Notes (Signed)
Pt given orange juice due to blood glucose at 64, MD Horton and receiving RN made aware.

## 2013-11-30 LAB — LIPID PANEL
CHOLESTEROL: 109 mg/dL (ref 0–200)
HDL: 33 mg/dL — ABNORMAL LOW (ref >39)
LDL CALC: 62 mg/dL (ref 0–99)
TRIGLYCERIDES: 68 mg/dL (ref <150)
Total CHOL/HDL Ratio: 3.3 RATIO
VLDL: 14 mg/dL (ref 0–40)

## 2013-11-30 LAB — GLUCOSE, CAPILLARY
Glucose-Capillary: 134 mg/dL — ABNORMAL HIGH (ref 70–99)
Glucose-Capillary: 141 mg/dL — ABNORMAL HIGH (ref 70–99)
Glucose-Capillary: 146 mg/dL — ABNORMAL HIGH (ref 70–99)

## 2013-11-30 MED ORDER — LISINOPRIL 20 MG PO TABS: 20.0000 mg | ORAL_TABLET | Freq: Every day | ORAL | Status: DC

## 2013-11-30 MED ORDER — APIXABAN 5 MG PO TABS
5.0000 mg | ORAL_TABLET | Freq: Two times a day (BID) | ORAL | Status: DC
  Administered 2013-11-30: 5 mg via ORAL
  Filled 2013-11-30 (×2): qty 1

## 2013-11-30 MED ORDER — APIXABAN 5 MG PO TABS: 5.0000 mg | ORAL_TABLET | Freq: Two times a day (BID) | ORAL | Status: DC

## 2013-11-30 MED ORDER — CLONIDINE HCL 0.1 MG PO TABS: 0.1000 mg | ORAL_TABLET | Freq: Two times a day (BID) | ORAL | Status: DC

## 2013-11-30 NOTE — Progress Notes (Signed)
PT eval addendum - late entry for G-codes   2013/12/17 1516  PT G-Codes **NOT FOR INPATIENT CLASS**  Functional Assessment Tool Used clinical judgement  Functional Limitation Mobility: Walking and moving around  Mobility: Walking and Moving Around Current Status [phone]) CK  Mobility: Walking and Moving Around Goal Status [phone]) CJ    Alvie Heidelberg, PT, DPT [phone]

## 2013-11-30 NOTE — Progress Notes (Signed)
STROKE TEAM PROGRESS NOTE   HISTORY Robin Snow is a 73 y.o. female with a history of A. fib who is not on anticoagulation do to history of GI bleeds from diverticular source. She went to bed in her normal state of health around 9 PM 11/28/2013, and then awoke with inability to move her right arm. She called out to her family called 911 and she was brought in by EMS on 11/29/2013. Since EMS arrival to the scene, she has had significant improvement in her right arm. She still does have some persistent numbness. Patient was not administered TPA secondary to delay in arrival. She was admitted for further evaluation and treatment.   SUBJECTIVE (INTERVAL HISTORY) Her  RN is at the bedside.  Overall she feels her condition is gradually improving. . She has history of diverticular bleed x 2 while  on warfarin which has been discontinued. She had an colonoscopy but no culprit lesion was found. She has done well on aspirin without bleeding. She has no new complaints today.   OBJECTIVE Temp:  [97.7 F (36.5 C)-98.4 F (36.9 C)] 98.4 F (36.9 C) (08/27 1427) Pulse Rate:  [72-98] 80 (08/27 1427) Cardiac Rhythm:  [-] Atrial fibrillation (08/27 0750) Resp:  [17-18] 17 (08/27 1427) BP: (106-132)/(64-89) 107/64 mmHg (08/27 1427) SpO2:  [97 %-99 %] 97 % (08/27 1427)   Recent Labs Lab 11/29/13 1818 11/29/13 2138 11/30/13 0744 11/30/13 1123  GLUCAP 124* 175* 134* 141*    Recent Labs Lab 11/29/13 0432 11/29/13 0442  NA 139 138  K 4.3 4.2  CL 101 106  CO2 23  --   GLUCOSE 61* 64*  BUN 9 9  CREATININE 0.75 0.80  CALCIUM 9.3  --     Recent Labs Lab 11/29/13 0432  AST 24  ALT 13  ALKPHOS 98  BILITOT 0.7  PROT 9.0*  ALBUMIN 3.1*    Recent Labs Lab 11/29/13 0432 11/29/13 0442  WBC 7.5  --   NEUTROABS 5.2  --   HGB 10.5* 11.9*  HCT 31.8* 35.0*  MCV 84.8  --   PLT 203  --    No results found for this basename: CKTOTAL, CKMB, CKMBINDEX, TROPONINI,  in the last 168  hours  Recent Labs  11/29/13 0432  LABPROT 15.4*  INR 1.22    Recent Labs  11/29/13 0520  COLORURINE YELLOW  LABSPEC 1.012  PHURINE 5.5  GLUCOSEU NEGATIVE  HGBUR NEGATIVE  BILIRUBINUR NEGATIVE  KETONESUR 15*  PROTEINUR NEGATIVE  UROBILINOGEN 1.0  NITRITE POSITIVE*  LEUKOCYTESUR TRACE*       Component Value Date/Time   CHOL 109 11/30/2013 0935   TRIG 68 11/30/2013 0935   HDL 33* 11/30/2013 0935   CHOLHDL 3.3 11/30/2013 0935   VLDL 14 11/30/2013 0935   LDLCALC 62 11/30/2013 0935   Lab Results  Component Value Date   HGBA1C 5.7* 11/29/2013      Component Value Date/Time   LABOPIA NONE DETECTED 11/29/2013 0520   COCAINSCRNUR NONE DETECTED 11/29/2013 0520   LABBENZ NONE DETECTED 11/29/2013 0520   AMPHETMU NONE DETECTED 11/29/2013 0520   THCU NONE DETECTED 11/29/2013 0520   LABBARB NONE DETECTED 11/29/2013 0520     Recent Labs Lab 11/29/13 0432  ETH <11    Ct Head Wo Contrast  11/29/2013   CLINICAL DATA:  Right-sided weakness.  Code stroke.  EXAM: CT HEAD WITHOUT CONTRAST  TECHNIQUE: Contiguous axial images were obtained from the base of the skull through the vertex without intravenous  contrast.  COMPARISON:  None.  FINDINGS: Mild cerebral atrophy. Low-attenuation changes in the deep white matter consistent with small vessel ischemia. No mass effect or midline shift. No abnormal extra-axial fluid collections. Gray-white matter junctions are distinct. Basal cisterns are not effaced. No evidence of acute intracranial hemorrhage. No depressed skull fractures. Visualized paranasal sinuses and mastoid air cells are not opacified. Vascular calcifications.  IMPRESSION: No acute intracranial abnormalities. Chronic atrophy and small vessel ischemic changes.  Code stroke results were called by telephone at the time of interpretation on 11/29/2013 at 4:44 am to Dr. Wayland Salinas , who verbally acknowledged these results.   Electronically Signed   By: Burman Nieves M.D.   On: 11/29/2013 04:46    Mri Brain Without Contrast  11/29/2013   CLINICAL DATA:  TIA.  Atrial fibrillation.  Right arm weakness.  EXAM: MRI HEAD WITHOUT CONTRAST  MRA HEAD WITHOUT CONTRAST  TECHNIQUE: Multiplanar, multiecho pulse sequences of the brain and surrounding structures were obtained without intravenous contrast. Angiographic images of the head were obtained using MRA technique without contrast.  COMPARISON:  Head CT same day  FINDINGS: MRI HEAD FINDINGS  Diffusion imaging shows a punctate acute infarction at the right inferior pons. No other acute infarction.  The brainstem appears otherwise unremarkable. No cerebellar abnormality. The cerebral hemispheres show moderate chronic small vessel change throughout the deep and subcortical white matter. No cortical or large vessel territory infarction. No mass lesion, hemorrhage, hydrocephalus or extra-axial collection. No pituitary mass. No inflammatory sinus disease. No skull or skullbase lesion.  MRA HEAD FINDINGS  Both internal carotid arteries are widely patent into the brain. The anterior and middle cerebral vessels are normal without proximal stenosis, aneurysm or vascular malformation. Both vertebral arteries are widely patent to the basilar. No basilar stenosis. Posterior circulation branch vessels appear normal.  IMPRESSION: Punctate acute infarction at the right inferior pons.  Moderate chronic small-vessel changes affecting the cerebral hemispheric white matter.  Normal MR angiography of the large and medium size vessels.   Electronically Signed   By: Paulina Fusi M.D.   On: 11/29/2013 13:13   Mr Robin Snow Head/brain Wo Cm  11/29/2013   CLINICAL DATA:  TIA.  Atrial fibrillation.  Right arm weakness.  EXAM: MRI HEAD WITHOUT CONTRAST  MRA HEAD WITHOUT CONTRAST  TECHNIQUE: Multiplanar, multiecho pulse sequences of the brain and surrounding structures were obtained without intravenous contrast. Angiographic images of the head were obtained using MRA technique without contrast.   COMPARISON:  Head CT same day  FINDINGS: MRI HEAD FINDINGS  Diffusion imaging shows a punctate acute infarction at the right inferior pons. No other acute infarction.  The brainstem appears otherwise unremarkable. No cerebellar abnormality. The cerebral hemispheres show moderate chronic small vessel change throughout the deep and subcortical white matter. No cortical or large vessel territory infarction. No mass lesion, hemorrhage, hydrocephalus or extra-axial collection. No pituitary mass. No inflammatory sinus disease. No skull or skullbase lesion.  MRA HEAD FINDINGS  Both internal carotid arteries are widely patent into the brain. The anterior and middle cerebral vessels are normal without proximal stenosis, aneurysm or vascular malformation. Both vertebral arteries are widely patent to the basilar. No basilar stenosis. Posterior circulation branch vessels appear normal.  IMPRESSION: Punctate acute infarction at the right inferior pons.  Moderate chronic small-vessel changes affecting the cerebral hemispheric white matter.  Normal MR angiography of the large and medium size vessels.   Electronically Signed   By: Paulina Fusi M.D.   On: 11/29/2013  13:13     PHYSICAL EXAM Pleasant elderly African American lady not in distress.Awake alert. Afebrile. Head is nontraumatic. Neck is supple without bruit. Hearing is normal. Cardiac exam no murmur or gallop. Lungs are clear to auscultation. Distal pulses are well felt. Neurological Exam : Awake alert oriented x 3 normal speech and language. Mild rightlower face asymmetry. Tongue midline. No drift. Mild diminished fine finger movements on right Orbits left over right upper extremity. Mild right grip weak.. Normal sensation . Normal coordination. ASSESSMENT/PLAN  Ms. DONNIA POPLASKI is a 73 y.o. female with hx of atrial fibrillation not an anticoagulation secondary to hx GIB from diverticular source presenting with right arm hemiparesis. She did not receive IV  t-PA due to delay in arrival. MRI shows small punctate right Pontine infarct  cardioembolic from atrial fibrillation Probable Stroke/TIA:  Probable left brain infarct embolic secondary to known atrial fibrillation, not on anticoagulation     aspirin 81 mg orally every day prior to admission, now on aspirin 81 mg orally every day. Recommend eliquis 5 mg bid. Per AVERROES trial - eliquis bleeding risk comparable to aspirin.  MRI punctate acute infarction at the right  inferior pons                               MRA no large vessel stenosis 2D Echo  The cavity size was normal. Wall thickness was increased in a pattern of mild LVH. Systolic function was normal. The estimated ejection fraction was in the range of 60% to 65%    Carotid Dopplers : 1-39 5 bilateral stenosis  Lovenox 40 mg sq daily for VTE prophylaxis  Carb Control thin liquids.   Bedrest with Bathroom privileges-> Up with assistance  Resultant right hemiparesis resolved  Therapy needs:  evals pending  Patient counseled to be compliant with her antithrombotic/anticoagulant medications by Dr. Pearlean Brownie to patient and son  Disposition:  pending  Hypertension   Home meds:  Catapress, diltiazem, lasix, lisinopril, metoprolol  BP 106-123/04-87 past 24h  SBP goal < 220 from stroke standpoint  Stable  Patient counseled to be compliant with her blood pressure medications  Hyperlipidemia  LDL pending.   Patient on mevacor 20 at home, changed to lipitor 20 in hospital  LDL goal < 100 (<70 for diabetics)  Diabetes  HgbA1c 5.7  On amaryl at home  Controlled   Goal < 7.0   Educated patient about lifestyle changes for diabetes prevention  Other Stroke Risk Factors Advanced age   Obesity, Body mass index is 36.25 kg/(m^2).   Other Pertinent History  GIB on anticoagulation, diverticular source  Hospital day # 1  Stroke team will sign off Call for questions  Delia Heady, MD Medical Director [facility] Pager: [phone] 11/30/2013 3:54 PM     To contact Stroke Continuity provider, please refer to [email or web address]. After hours, contact General Neurology

## 2013-11-30 NOTE — Progress Notes (Signed)
[facility]  Patient Status: Active (receiving services up to time of hospitalization)  [facility] is providing the following services: PT Would benefit from having RN at home. Please order if you agree.  If patient discharges after hours, please call [phone].   Kizzie Furnish 11/30/2013, 9:51 AM

## 2013-11-30 NOTE — Discharge Summary (Signed)
Robin Snow, is a 73 y.o. female  DOB [birth year]  MRN [id number].  Admission date:  11/29/2013  Admitting Physician  Lynden Oxford, MD  Discharge Date:  11/30/2013   Primary MD  Burtis Junes, MD  Recommendations for primary care physician for things to follow:   Patient placed on Eliquis for A. fib-related stroke. Kindly monitor her for GI blood loss. Monitor second is factors for CVA closely.   Admission Diagnosis  Transient cerebral ischemia, unspecified transient cerebral ischemia type [435.9]   Discharge Diagnosis  Transient cerebral ischemia, unspecified transient cerebral ischemia type [435.9]     Principal Problem:   TIA (transient ischemic attack) Active Problems:   HYPERTENSION, BENIGN   Atrial fibrillation   GI bleed   GERD (gastroesophageal reflux disease)   Diabetes mellitus, type 2      Past Medical History  Diagnosis Date  . Atrial fibrillation   . Hypertension   . Arthritis   . Coagulopathy   . Diabetes mellitus     Type 2  . GI bleed   . Pneumonia     2-3 years ago  . GERD (gastroesophageal reflux disease)   . Anemia   . Hx of transfusion of packed red blood cells   . Diverticulosis   . Anticoagulant drug declined     due to hx GI bleeding    Past Surgical History  Procedure Laterality Date  . Cesarean section    . Replacement total knee bilateral    . Tubal ligation    . Esophagogastroduodenoscopy Left 01/23/2013    Procedure: ESOPHAGOGASTRODUODENOSCOPY (EGD);  Surgeon: Willis Modena, MD;  Location: [facility] ENDOSCOPY;  Service: Endoscopy;  Laterality: Left;  . Colonoscopy Left 01/24/2013    Procedure: COLONOSCOPY;  Surgeon: Willis Modena, MD;  Location: [facility] ENDOSCOPY;  Service: Endoscopy;  Laterality: Left;  [name] Eye surgery Bilateral     cataract surgery  . Joint replacement          History of present illness and  Hospital Course:     Kindly see H&P for history of present illness and admission details, please review complete Labs, Consult reports and Test reports for all details in brief  HPI  from the history and physical done on the day of admission  Robin Snow is a 73 y.o. female with Past medical history of A. fib, hypertension, diabetes, GI bleeding, GERD.  The patient presented with complaints of right-sided weakness. Symptoms started when she was in her bed and was not able to move her right arm. When she went to sleep at 9 PM she was at her baseline. After the right-sided weakness was discovered she informed as to her family who called 911 and the patient was brought to the hospital. At the time of her arrival according stroke was called but since she was out of the window period no TPA was given. The patient is coming from home. And at her baseline independent for most of her ADL.    Hospital Course  1.R sided weakness - MRI shows an incidental Acute CVA - Punctate acute infarction at the right inferior pons. Which does not correlate with her site of weakness, she likely has small vessel disease, discussed in detail with neurologist Dr. Pearlean Brownie plan is to stop her aspirin and put her on Eliquis, he had detailed discussion with patient and family about risks and benefits of Eliquis in terms of GI bleed and stroke. Patient and family accept Eliquis. Statin will be continued. LDL and A1c were within acceptable limits. TTE and carotid duplex unremarkable. Patient refused placement home PT OT and RN have been arranged.   2. Hypertension stable. She is currently on accommodation of Catapres, diltiazem, Lasix, ACE inhibitor and metoprolol which will be continued.   3. Dyslipidemia. LDL less than 100 continue home dose statin.   4. History of arthritis. No acute issues supportive care has chronic right knee pain.   Discharge Condition:  stable   Follow UP  Follow-up Information   Follow up with [facility]. (Registered Nurse, Physical and Occupational Therapy. )    Contact information:   [address] [place] [address] [phone]       Follow up with Burtis Junes, MD. Schedule an appointment as soon as possible for a visit in 1 week.   Specialty:  Family Medicine   Contact information:   [address] [address] [place] [address] [phone]       Follow up with SETHI,PRAMOD, MD. Schedule an appointment as soon as possible for a visit in 4 weeks.   Specialties:  Neurology, Radiology   Contact information:   [address] [place] [address] [phone]         Discharge Instructions  and  Discharge Medications          Discharge Instructions   Diet - low sodium heart healthy    Complete by:  As directed      Discharge instructions    Complete by:  As directed   Follow with Primary MD Burtis Junes, MD in 7 days   Get CBC, CMP, 2 view Chest X ray checked  by Primary MD next visit.    Activity: As tolerated with Full fall precautions use walker/cane & assistance as needed   Disposition Home     Diet: Heart Healthy Low Carb.  For Heart failure patients - Check your Weight same time everyday, if you gain over 2 pounds, or you develop in leg swelling, experience more shortness of breath or chest pain, call your Primary MD immediately. Follow Cardiac Low Salt Diet and 1.8 lit/day fluid restriction.   On your next visit with her primary care physician please Get Medicines reviewed and adjusted.  Please request your Prim.MD to go over all Hospital Tests and Procedure/Radiological results at the follow up, please get all Hospital records sent to your Prim MD by signing hospital release before you go home.   If you experience worsening of your admission symptoms, develop shortness of breath, life threatening emergency, suicidal  or homicidal thoughts you must seek medical attention immediately by calling 911 or calling your MD immediately  if symptoms less severe.  You Must read complete instructions/literature along with all the possible adverse reactions/side effects for all the Medicines you take and that have been prescribed to you. Take any new Medicines after you have completely understood and accpet all the possible adverse reactions/side effects.   Do not drive, operating heavy machinery,  perform activities at heights, swimming or participation in water activities or provide baby sitting services if your were admitted for syncope or siezures until you have seen by Primary MD or a Neurologist and advised to do so again.  Do not drive when taking Pain medications.    Do not take more than prescribed Pain, Sleep and Anxiety Medications  Special Instructions: If you have smoked or chewed Tobacco  in the last 2 yrs please stop smoking, stop any regular Alcohol  and or any Recreational drug use.  Wear Seat belts while driving.   Please note  You were cared for by a hospitalist during your hospital stay. If you have any questions about your discharge medications or the care you received while you were in the hospital after you are discharged, you can call the unit and asked to speak with the hospitalist on call if the hospitalist that took care of you is not available. Once you are discharged, your primary care physician will handle any further medical issues. Please note that NO REFILLS for any discharge medications will be authorized once you are discharged, as it is imperative that you return to your primary care physician (or establish a relationship with a primary care physician if you do not have one) for your aftercare needs so that they can reassess your need for medications and monitor your lab values.     Increase activity slowly    Complete by:  As directed             Medication List    STOP taking  these medications       aspirin EC 81 MG tablet      TAKE these medications       acetaminophen 500 MG tablet  Commonly known as:  TYLENOL  Take 500 mg by mouth every 6 (six) hours as needed for mild pain.     apixaban 5 MG Tabs tablet  Commonly known as:  ELIQUIS  Take 1 tablet (5 mg total) by mouth 2 (two) times daily.     cloNIDine 0.1 MG tablet  Commonly known as:  CATAPRES  Take 1 tablet (0.1 mg total) by mouth 2 (two) times daily.  Start taking on:  12/01/2013     diclofenac sodium 1 % Gel  Commonly known as:  VOLTAREN  Apply 4 g topically 4 (four) times daily as needed (for pain).     diltiazem 180 MG 24 hr capsule  Commonly known as:  CARDIZEM CD  Take 1 capsule (180 mg total) by mouth daily.     furosemide 40 MG tablet  Commonly known as:  LASIX  Take 40 mg by mouth daily as needed (swelling).     gabapentin 100 MG capsule  Commonly known as:  NEURONTIN  Take 100 mg by mouth at bedtime.     glimepiride 2 MG tablet  Commonly known as:  AMARYL  Take 2 mg by mouth daily before breakfast.     lisinopril 20 MG tablet  Commonly known as:  PRINIVIL,ZESTRIL  Take 1 tablet (20 mg total) by mouth daily.  Start taking on:  12/01/2013     lovastatin 20 MG tablet  Commonly known as:  MEVACOR  Take 20 mg by mouth at bedtime.     metoprolol tartrate 12.5 mg Tabs tablet  Commonly known as:  LOPRESSOR  Take 0.5 tablets (12.5 mg total) by mouth 2 (two) times daily.     oxybutynin 5 MG 24 hr tablet  Commonly known as:  DITROPAN-XL  Take 5 mg by mouth daily.     oxyCODONE-acetaminophen 7.5-325 MG per tablet  Commonly known as:  PERCOCET  Take 1 tablet by mouth every 4 (four) hours as needed for pain.     pantoprazole 40 MG tablet  Commonly known as:  PROTONIX  Take 40 mg by mouth daily.     potassium chloride SA 20 MEQ tablet  Commonly known as:  K-DUR,KLOR-CON  Take 20 mEq by mouth daily.     traMADol 50 MG tablet  Commonly known as:  ULTRAM  Take 2 tablets  (100 mg total) by mouth every 6 (six) hours as needed for moderate pain or severe pain.          Diet and Activity recommendation: See Discharge Instructions above   Consults obtained - Neuro   Major procedures and Radiology Reports - PLEASE review detailed and final reports for all details, in brief -   TTE  - The patient was in atrial fibrillation. Normal LV size with mild LV hypertrophy. EF 60-65%. Normal RV size and systolic function. Severe biatrial enlargement.    Vascular Ultrasound   Carotid Duplex (Doppler) has been completed. Preliminary findings: Bilateral: 1-39% ICA stenosis. Vertebral artery flow is antegrade.       Ct Head Wo Contrast  11/29/2013   CLINICAL DATA:  Right-sided weakness.  Code stroke.  EXAM: CT HEAD WITHOUT CONTRAST  TECHNIQUE: Contiguous axial images were obtained from the base of the skull through the vertex without intravenous contrast.  COMPARISON:  None.  FINDINGS: Mild cerebral atrophy. Low-attenuation changes in the deep white matter consistent with small vessel ischemia. No mass effect or midline shift. No abnormal extra-axial fluid collections. Gray-white matter junctions are distinct. Basal cisterns are not effaced. No evidence of acute intracranial hemorrhage. No depressed skull fractures. Visualized paranasal sinuses and mastoid air cells are not opacified. Vascular calcifications.  IMPRESSION: No acute intracranial abnormalities. Chronic atrophy and small vessel ischemic changes.  Code stroke results were called by telephone at the time of interpretation on 11/29/2013 at 4:44 am to Dr. Wayland Salinas , who verbally acknowledged these results.   Electronically Signed   By: Burman Nieves M.D.   On: 11/29/2013 04:46   Mri Brain Without Contrast  11/29/2013   CLINICAL DATA:  TIA.  Atrial fibrillation.  Right arm weakness.  EXAM: MRI HEAD WITHOUT CONTRAST  MRA HEAD WITHOUT CONTRAST  TECHNIQUE: Multiplanar, multiecho pulse sequences of the brain and  surrounding structures were obtained without intravenous contrast. Angiographic images of the head were obtained using MRA technique without contrast.  COMPARISON:  Head CT same day  FINDINGS: MRI HEAD FINDINGS  Diffusion imaging shows a punctate acute infarction at the right inferior pons. No other acute infarction.  The brainstem appears otherwise unremarkable. No cerebellar abnormality. The cerebral hemispheres show moderate chronic small vessel change throughout the deep and subcortical white matter. No cortical or large vessel territory infarction. No mass lesion, hemorrhage, hydrocephalus or extra-axial collection. No pituitary mass. No inflammatory sinus disease. No skull or skullbase lesion.  MRA HEAD FINDINGS  Both internal carotid arteries are widely patent into the brain. The anterior and middle cerebral vessels are normal without proximal stenosis, aneurysm or vascular malformation. Both vertebral arteries are widely patent to the basilar. No basilar stenosis. Posterior circulation branch vessels appear normal.  IMPRESSION: Punctate acute infarction at the right inferior pons.  Moderate chronic small-vessel changes affecting the cerebral hemispheric white matter.  Normal MR  angiography of the large and medium size vessels.   Electronically Signed   By: Paulina Fusi M.D.   On: 11/29/2013 13:13   Dg Chest Port 1 View  11/13/2013   CLINICAL DATA:  Irregular heartbeat.  EXAM: PORTABLE CHEST - 1 VIEW  COMPARISON:  05/01/2010 and 02/03/2008 as well as chest CT 05/01/2010  FINDINGS: Lungs are somewhat hypoinflated without consolidation or effusion. There is mild prominence of the perihilar markings suggesting a mild degree of vascular congestion. Mild to moderate cardiomegaly is present. There are degenerative changes of the spine with mild curvature of the thoracic spine convex to the right unchanged.  IMPRESSION: Mild to moderate cardiomegaly with suggestion of minimal vascular congestion.   Electronically  Signed   By: Elberta Fortis M.D.   On: 11/13/2013 14:32   Dg Fluoro Guide Ndl Plc/bx  11/14/2013   CLINICAL DATA:  Painful, swollen right knee. Remote history of total knee arthroplasty. Abnormal bone scan.  EXAM: Right knee aspiration under fluoroscopy  FLUOROSCOPY TIME:  0 min and 18 seconds  PROCEDURE: Written informed consent was obtained. Discussed complications of bleeding, infection and damage to normal structures.  An appropriate site for arthrocentesis was marked on the patient's skin along the medial aspect of the patella. The site was prepped and draped in the usual sterile fashion and local anesthesia was achieved with 1% xylocaine. A 20 gauge spinal needle was then inserted into the knee joint. I was not able to aspirate any significant fluid. I did inject 5 cc of Omnipaque 180 to document intra-articular location and then instilled 10 cc of sterile normal saline. I aspirated back approximately 8 cc of slightly bloody fluid. This will be sent for appropriate laboratory evaluation.  IMPRESSION: Fluoroscopic guided right knee joint aspiration.   Electronically Signed   By: Loralie Champagne M.D.   On: 11/14/2013 13:59   [name] Head/brain Wo Cm  11/29/2013   CLINICAL DATA:  TIA.  Atrial fibrillation.  Right arm weakness.  EXAM: MRI HEAD WITHOUT CONTRAST  MRA HEAD WITHOUT CONTRAST  TECHNIQUE: Multiplanar, multiecho pulse sequences of the brain and surrounding structures were obtained without intravenous contrast. Angiographic images of the head were obtained using MRA technique without contrast.  COMPARISON:  Head CT same day  FINDINGS: MRI HEAD FINDINGS  Diffusion imaging shows a punctate acute infarction at the right inferior pons. No other acute infarction.  The brainstem appears otherwise unremarkable. No cerebellar abnormality. The cerebral hemispheres show moderate chronic small vessel change throughout the deep and subcortical white matter. No cortical or large vessel territory infarction. No mass  lesion, hemorrhage, hydrocephalus or extra-axial collection. No pituitary mass. No inflammatory sinus disease. No skull or skullbase lesion.  MRA HEAD FINDINGS  Both internal carotid arteries are widely patent into the brain. The anterior and middle cerebral vessels are normal without proximal stenosis, aneurysm or vascular malformation. Both vertebral arteries are widely patent to the basilar. No basilar stenosis. Posterior circulation branch vessels appear normal.  IMPRESSION: Punctate acute infarction at the right inferior pons.  Moderate chronic small-vessel changes affecting the cerebral hemispheric white matter.  Normal MR angiography of the large and medium size vessels.   Electronically Signed   By: Paulina Fusi M.D.   On: 11/29/2013 13:13    Micro Results      No results found for this or any previous visit (from the past 240 hour(s)).     Today   Subjective:   Robin Snow today has no headache,no  chest abdominal pain,no new weakness tingling or numbness, feels much better wants to go home today.   Objective:   Blood pressure 106/77, pulse 92, temperature 98.3 F (36.8 C), temperature source Oral, resp. rate 18, height  (1.702 m), weight 105.008 kg (231 lb 8 oz), SpO2 99.00%.   Intake/Output Summary (Last 24 hours) at 11/30/13 1301 Last data filed at 11/29/13 1800  Gross per 24 hour  Intake    120 ml  Output      0 ml  Net    120 ml    Exam Awake Alert, Oriented x 3, No new F.N deficits, Normal affect [place].AT,PERRAL Supple Neck,No JVD, No cervical lymphadenopathy appriciated.  Symmetrical Chest wall movement, Good air movement bilaterally, CTAB RRR,No Gallops,Rubs or new Murmurs, No Parasternal Heave +ve B.Sounds, Abd Soft, Non tender, No organomegaly appriciated, No rebound -guarding or rigidity. No Cyanosis, Clubbing or edema, No new Rash or bruise  Data Review   CBC w Diff: Lab Results  Component Value Date   WBC 7.5 11/29/2013   HGB 11.9* 11/29/2013    HCT 35.0* 11/29/2013   PLT 203 11/29/2013   LYMPHOPCT 22 11/29/2013   MONOPCT 8 11/29/2013   EOSPCT 0 11/29/2013   BASOPCT 0 11/29/2013    CMP: Lab Results  Component Value Date   NA 138 11/29/2013   K 4.2 11/29/2013   CL 106 11/29/2013   CO2 23 11/29/2013   BUN 9 11/29/2013   CREATININE 0.80 11/29/2013   PROT 9.0* 11/29/2013   ALBUMIN 3.1* 11/29/2013   BILITOT 0.7 11/29/2013   ALKPHOS 98 11/29/2013   AST 24 11/29/2013   ALT 13 11/29/2013  . Lab Results  Component Value Date   HGBA1C 5.7* 11/29/2013   Lab Results  Component Value Date   CHOL 109 11/30/2013   HDL 33* 11/30/2013   LDLCALC 62 11/30/2013   TRIG 68 11/30/2013   CHOLHDL 3.3 11/30/2013      Total Time in preparing paper work, data evaluation and todays exam - 35 minutes  Leroy Sea M.D on 11/30/2013 at 1:01 PM  [facility] Office  [phone]   **Disclaimer: This note may have been dictated with voice recognition software. Similar sounding words can inadvertently be transcribed and this note may contain transcription errors which may not have been corrected upon publication of note.**

## 2013-11-30 NOTE — Progress Notes (Signed)
Chaplain brought advance directive packet at pt's request. Pt recounted her stroke incident at home and good progress that she is making. Filled in the forms at pt's direction, and she initialed appropriately. Someone from pt transport arrived to take her to a test, so I will come later in the day to find witnesses and complete the process.

## 2013-11-30 NOTE — Progress Notes (Signed)
*  PRELIMINARY RESULTS* Vascular Ultrasound Carotid Duplex (Doppler) has been completed.  Preliminary findings: Bilateral:  1-39% ICA stenosis.  Vertebral artery flow is antegrade.      Farrel Demark, RDMS, RVT  11/30/2013, 11:11 AM

## 2013-11-30 NOTE — Discharge Instructions (Addendum)
Follow with Primary MD Burtis Junes, MD in 7 days   Get CBC, CMP, 2 view Chest X ray checked  by Primary MD next visit.    Activity: As tolerated with Full fall precautions use walker/cane & assistance as needed   Disposition Home     Diet: Heart Healthy Low Carb  For Heart failure patients - Check your Weight same time everyday, if you gain over 2 pounds, or you develop in leg swelling, experience more shortness of breath or chest pain, call your Primary MD immediately. Follow Cardiac Low Salt Diet and 1.8 lit/day fluid restriction.   On your next visit with her primary care physician please Get Medicines reviewed and adjusted.  Please request your Prim.MD to go over all Hospital Tests and Procedure/Radiological results at the follow up, please get all Hospital records sent to your Prim MD by signing hospital release before you go home.   If you experience worsening of your admission symptoms, develop shortness of breath, life threatening emergency, suicidal or homicidal thoughts you must seek medical attention immediately by calling 911 or calling your MD immediately  if symptoms less severe.  You Must read complete instructions/literature along with all the possible adverse reactions/side effects for all the Medicines you take and that have been prescribed to you. Take any new Medicines after you have completely understood and accpet all the possible adverse reactions/side effects.   Do not drive, operating heavy machinery, perform activities at heights, swimming or participation in water activities or provide baby sitting services if your were admitted for syncope or siezures until you have seen by Primary MD or a Neurologist and advised to do so again.  Do not drive when taking Pain medications.    Do not take more than prescribed Pain, Sleep and Anxiety Medications  Special Instructions: If you have smoked or chewed Tobacco  in the last 2 yrs please stop smoking, stop  any regular Alcohol  and or any Recreational drug use.  Wear Seat belts while driving.   Please note  You were cared for by a hospitalist during your hospital stay. If you have any questions about your discharge medications or the care you received while you were in the hospital after you are discharged, you can call the unit and asked to speak with the hospitalist on call if the hospitalist that took care of you is not available. Once you are discharged, your primary care physician will handle any further medical issues. Please note that NO REFILLS for any discharge medications will be authorized once you are discharged, as it is imperative that you return to your primary care physician (or establish a relationship with a primary care physician if you do not have one) for your aftercare needs so that they can reassess your need for medications and monitor your lab values.   STROKE/TIA DISCHARGE INSTRUCTIONS SMOKING Cigarette smoking nearly doubles your risk of having a stroke & is the single most alterable risk factor  If you smoke or have smoked in the last 12 months, you are advised to quit smoking for your health.  Most of the excess cardiovascular risk related to smoking disappears within a year of stopping.  Ask you doctor about anti-smoking medications  [place] Quit Line: 1-800-QUIT NOW  Free Smoking Cessation Classes [phone]  CHOLESTEROL Know your levels; limit fat & cholesterol in your diet  Lipid Panel     Component Value Date/Time   CHOL 109 11/30/2013 0935   TRIG 68 11/30/2013  0935   HDL 33* 11/30/2013 0935   CHOLHDL 3.3 11/30/2013 0935   VLDL 14 11/30/2013 0935   LDLCALC 62 11/30/2013 0935      Many patients benefit from treatment even if their cholesterol is at goal.  Goal: Total Cholesterol (CHOL) less than 160  Goal:  Triglycerides (TRIG) less than 150  Goal:  HDL greater than 40  Goal:  LDL (LDLCALC) less than 100   BLOOD PRESSURE American Stroke Association  blood pressure target is less that 120/80 mm/Hg  Your discharge blood pressure is:  BP: 107/64 mmHg  Monitor your blood pressure  Limit your salt and alcohol intake  Many individuals will require more than one medication for high blood pressure  DIABETES (A1c is a blood sugar average for last 3 months) Goal HGBA1c is under 7% (HBGA1c is blood sugar average for last 3 months)  Diabetes: A1C=5.7  Lab Results  Component Value Date   HGBA1C 5.7* 11/29/2013     Your HGBA1c can be lowered with medications, healthy diet, and exercise.  Check your blood sugar as directed by your physician  Call your physician if you experience unexplained or low blood sugars.  PHYSICAL ACTIVITY/REHABILITATION Goal is 30 minutes at least 4 days per week  Activity: Increase activity slowly, Therapies: Physical Therapy: Home Health and Occupational Therapy: Home Health Return to work:   Activity decreases your risk of heart attack and stroke and makes your heart stronger.  It helps control your weight and blood pressure; helps you relax and can improve your mood.  Participate in a regular exercise program.  Talk with your doctor about the best form of exercise for you (dancing, walking, swimming, cycling).  DIET/WEIGHT Goal is to maintain a healthy weight  Your discharge diet is: Carb Control  liquids Your height is:  Height:  (170.2 cm) Your current weight is: Weight: 105.008 kg (231 lb 8 oz) Your Body Mass Index (BMI) is:  BMI (Calculated): 36.3  Following the type of diet specifically designed for you will help prevent another stroke.  Your goal weight range is:    Your goal Body Mass Index (BMI) is 19-24.  Healthy food habits can help reduce 3 risk factors for stroke:  High cholesterol, hypertension, and excess weight.  RESOURCES Stroke/Support Group:  Call [phone]   STROKE EDUCATION PROVIDED/REVIEWED AND GIVEN TO PATIENT Stroke warning signs and symptoms How to activate emergency  medical system (call 911). Medications prescribed at discharge. Need for follow-up after discharge. Personal risk factors for stroke. Pneumonia vaccine given: No Flu vaccine given: No My questions have been answered, the writing is legible, and I understand these instructions.  I will adhere to these goals & educational materials that have been provided to me after my discharge from the hospital.

## 2013-11-30 NOTE — Care Management Note (Addendum)
    Page 1 of 1   11/30/2013     3:41:14 PM CARE MANAGEMENT NOTE 11/30/2013  Patient:  Robin Snow, Robin Snow   Account Number:  [id number]  Date Initiated:  11/30/2013  Documentation initiated by:  GRAVES-BIGELOW,Diyana Starrett  Subjective/Objective Assessment:   Pt admitted for Right-sided weakness. MRI revealed Punctate acute infarction at the right inferior pons.     Action/Plan:   CM did offer choice for  HH serivces and pt is active with [facility]. Resumption orders placed for services. SOC to begin within 24-48 hours post d/c. No DME needs at this time.   Anticipated DC Date:  11/30/2013   Anticipated DC Plan:  HOME W HOME HEALTH SERVICES      DC Planning Services  CM consult      [facility] Choice  Resumption Of Svcs/PTA Provider  HOME HEALTH   Choice offered to / List presented to:          [facility] arranged  HH-1 RN  HH-10 DISEASE MANAGEMENT  HH-2 PT  HH-3 OT      [facility] agency  [facility]   Status of service:  Completed, signed off Medicare Important Message given?  NO (If response is "NO", the following Medicare IM given date fields will be blank) Date Medicare IM given:   Medicare IM given by:   Date Additional Medicare IM given:   Additional Medicare IM given by:    Discharge Disposition:  HOME W HOME HEALTH SERVICES  Per UR Regulation:  Reviewed for med. necessity/level of care/duration of stay  If discussed at Long Length of Stay Meetings, dates discussed:    Comments:  30 day free card given to pt for eliquis and MD will need to provide Rx for 30 day free. Pt will need prior authorization for medication. Message to be sent to MD.

## 2013-11-30 NOTE — Progress Notes (Signed)
Chaplain returned to complete advance directive process. Found witnesses and called notary. Made two copies of completed AD. Original and one copy to pt. A copy to nurse at nurse's station to place in pt's chart.

## 2013-12-01 ENCOUNTER — Encounter: Payer: Medicare Other | Admitting: Nurse Practitioner

## 2013-12-12 ENCOUNTER — Ambulatory Visit: Payer: Medicare Other | Admitting: Neurology

## 2013-12-25 ENCOUNTER — Encounter: Payer: Medicare Other | Admitting: Nurse Practitioner

## 2013-12-29 ENCOUNTER — Ambulatory Visit: Payer: Medicare Other | Admitting: Nurse Practitioner

## 2013-12-29 ENCOUNTER — Telehealth: Payer: Self-pay | Admitting: Nurse Practitioner

## 2013-12-29 NOTE — Telephone Encounter (Signed)
Patient was no show for today's office appointment.  

## 2014-01-09 ENCOUNTER — Ambulatory Visit (INDEPENDENT_AMBULATORY_CARE_PROVIDER_SITE_OTHER): Payer: Medicare Other | Admitting: Nurse Practitioner

## 2014-01-09 ENCOUNTER — Encounter: Payer: Self-pay | Admitting: Nurse Practitioner

## 2014-01-09 VITALS — BP 120/70 | HR 100 | Ht 67.0 in | Wt 226.4 lb

## 2014-01-09 DIAGNOSIS — I482 Chronic atrial fibrillation, unspecified: Secondary | ICD-10-CM

## 2014-01-09 DIAGNOSIS — I4891 Unspecified atrial fibrillation: Secondary | ICD-10-CM

## 2014-01-09 DIAGNOSIS — Z79899 Other long term (current) drug therapy: Secondary | ICD-10-CM

## 2014-01-09 LAB — CBC
HCT: 29.5 % — ABNORMAL LOW (ref 36.0–46.0)
Hemoglobin: 9.6 g/dL — ABNORMAL LOW (ref 12.0–15.0)
MCHC: 32.7 g/dL (ref 30.0–36.0)
MCV: 86.4 fl (ref 78.0–100.0)
Platelets: 214 10*3/uL (ref 150.0–400.0)
RBC: 3.41 Mil/uL — ABNORMAL LOW (ref 3.87–5.11)
RDW: 15.3 % (ref 11.5–15.5)
WBC: 6.3 10*3/uL (ref 4.0–10.5)

## 2014-01-09 LAB — BASIC METABOLIC PANEL
BUN: 16 mg/dL (ref 6–23)
CO2: 29 mEq/L (ref 19–32)
Calcium: 8.9 mg/dL (ref 8.4–10.5)
Chloride: 104 mEq/L (ref 96–112)
Creatinine, Ser: 1.1 mg/dL (ref 0.4–1.2)
GFR: 63.72 mL/min (ref >60.00)
Glucose, Bld: 100 mg/dL — ABNORMAL HIGH (ref 70–99)
Potassium: 3.9 mEq/L (ref 3.5–5.1)
Sodium: 139 mEq/L (ref 135–145)

## 2014-01-09 MED ORDER — APIXABAN 5 MG PO TABS: 5.0000 mg | ORAL_TABLET | Freq: Two times a day (BID) | ORAL | Status: DC

## 2014-01-09 MED ORDER — METOPROLOL TARTRATE 25 MG PO TABS: 12.5000 mg | ORAL_TABLET | Freq: Two times a day (BID) | ORAL | Status: DC

## 2014-01-09 MED ORDER — METOPROLOL TARTRATE 12.5 MG HALF TABLET: 12.5000 mg | ORAL_TABLET | Freq: Two times a day (BID) | ORAL | Status: DC

## 2014-01-09 MED ORDER — DILTIAZEM HCL ER COATED BEADS 180 MG PO CP24: 180.0000 mg | ORAL_CAPSULE | Freq: Every day | ORAL | Status: DC

## 2014-01-09 NOTE — Progress Notes (Signed)
[name] Date of Birth: [birth year] Medical Record #[id number]  History of Present Illness: Robin Snow is seen back today for a post hospital visit. Seen for Dr. Clifton James. She is a 73 year old female with AF, OA, HTN, type 2 DM, GERD, and neuropathy. She had a negative cath in 2012 showing normal coronaries. No cardiology follow up since 2012.   She has had a history of anemia with past GI bleeding and has required transfusion. She has previously been on coumadin in the past but was stopped due to past bleeding - last bleed noted to be in February of 2015. Last colonoscopy in February of 2015.   Hospitalized 2 months ago with a TIA - found to be in her chronic AF but with RVR - Eliquis was added - she had no trouble taking this at discharge.   Comes in today. Here with her caregiver. This was to be a TOC visit - however she was admitted and discharged back in August and has changed her follow up visits here. She is doing ok. She is in a wheelchair today. She is mostly limited by her knee pain. She is not short of breath. Always out of rhythm. Has ran out of her beta blocker, CCB and her Eliquis - has not taken in several weeks. Says she was doing ok while on these. No active bleeding. She does use her Lasix practically every day.   Current Outpatient Prescriptions  Medication Sig Dispense Refill  . cloNIDine (CATAPRES) 0.1 MG tablet Take 1 tablet (0.1 mg total) by mouth 2 (two) times daily.  60 tablet  11  . diclofenac sodium (VOLTAREN) 1 % GEL Apply 4 g topically 4 (four) times daily as needed (for pain).      . furosemide (LASIX) 40 MG tablet Take 40 mg by mouth daily as needed (swelling).      [name] glimepiride (AMARYL) 2 MG tablet Take 2 mg by mouth daily before breakfast.      . hydrochlorothiazide (MICROZIDE) 12.5 MG capsule Take 12.5 mg by mouth daily.      [name] lisinopril (PRINIVIL,ZESTRIL) 20 MG tablet Take 1 tablet (20 mg total) by mouth daily.      [name] oxybutynin (DITROPAN-XL) 5 MG 24  hr tablet Take 5 mg by mouth daily.      . potassium chloride (KLOR-CON) 20 MEQ packet Take 20 mEq by mouth daily.      . potassium chloride SA (K-DUR,KLOR-CON) 20 MEQ tablet Take 20 mEq by mouth daily.       . traMADol (ULTRAM) 50 MG tablet Take 2 tablets (100 mg total) by mouth every 6 (six) hours as needed for moderate pain or severe pain.  120 tablet  0  . apixaban (ELIQUIS) 5 MG TABS tablet Take 1 tablet (5 mg total) by mouth 2 (two) times daily.  60 tablet  0  . diltiazem (CARDIZEM CD) 180 MG 24 hr capsule Take 1 capsule (180 mg total) by mouth daily.  30 capsule  0  . gabapentin (NEURONTIN) 100 MG capsule Take 100 mg by mouth at bedtime.      . metoprolol tartrate (LOPRESSOR) 12.5 mg TABS tablet Take 0.5 tablets (12.5 mg total) by mouth 2 (two) times daily.  60 each  0   No current facility-administered medications for this visit.    Allergies  Allergen Reactions  . Aspirin Hives    Patient takes this at home  . Hydrocodone Hives    Tolerates oxycodone and  tramadol  . Rofecoxib Hives    Past Medical History  Diagnosis Date  . Atrial fibrillation   . Hypertension   . Arthritis   . Coagulopathy   . Diabetes mellitus     Type 2  . GI bleed   . Pneumonia     2-3 years ago  . GERD (gastroesophageal reflux disease)   . Anemia   . Hx of transfusion of packed red blood cells   . Diverticulosis   . Anticoagulant drug declined     due to hx GI bleeding    Past Surgical History  Procedure Laterality Date  . Cesarean section    . Replacement total knee bilateral    . Tubal ligation    . Esophagogastroduodenoscopy Left 01/23/2013    Procedure: ESOPHAGOGASTRODUODENOSCOPY (EGD);  Surgeon: Willis Modena, MD;  Location: [facility] ENDOSCOPY;  Service: Endoscopy;  Laterality: Left;  . Colonoscopy Left 01/24/2013    Procedure: COLONOSCOPY;  Surgeon: Willis Modena, MD;  Location: [facility] ENDOSCOPY;  Service: Endoscopy;  Laterality: Left;  [name] Eye surgery Bilateral     cataract surgery  .  Joint replacement      History  Smoking status  . Former Smoker  . Types: Cigarettes  . Quit date: 07/07/1993  Smokeless tobacco  . Never Used    History  Alcohol Use No    Comment: no longer drinks alcohol    Family History  Problem Relation Age of Onset  . Cancer Father     Review of Systems: The review of systems is per the HPI.  All other systems were reviewed and are negative.  Physical Exam: BP 120/70  Pulse 100  Ht 5\' 7"  (1.702 m)  Wt 226 lb 6.4 oz (102.694 kg)  BMI 35.45 kg/m2 Patient is very pleasant and in no acute distress. Skin is warm and dry. Color is normal.  HEENT is unremarkable. Normocephalic/atraumatic. PERRL. Sclera are nonicteric. Neck is supple. No masses. No JVD. Lungs are clear. Cardiac exam shows a regular rate and rhythm. Abdomen is soft. Extremities are without edema. Gait and ROM are intact. No gross neurologic deficits noted.  Wt Readings from Last 3 Encounters:  01/09/14 226 lb 6.4 oz (102.694 kg)  11/29/13 231 lb 8 oz (105.008 kg)  11/16/13 244 lb 11.2 oz (110.995 kg)    LABORATORY DATA/PROCEDURES: EKG today shows AF with VR of 100.   Labs pending  Lab Results  Component Value Date   WBC 7.5 11/29/2013   HGB 11.9* 11/29/2013   HCT 35.0* 11/29/2013   PLT 203 11/29/2013   GLUCOSE 64* 11/29/2013   CHOL 109 11/30/2013   TRIG 68 11/30/2013   HDL 33* 11/30/2013   LDLCALC 62 11/30/2013   ALT 13 11/29/2013   AST 24 11/29/2013   NA 138 11/29/2013   K 4.2 11/29/2013   CL 106 11/29/2013   CREATININE 0.80 11/29/2013   BUN 9 11/29/2013   CO2 23 11/29/2013   TSH 0.938 11/13/2013   INR 1.22 11/29/2013   HGBA1C 5.7* 11/29/2013    BNP (last 3 results)  Recent Labs  11/13/13 1415  PROBNP 683.9*     Assessment / Plan: 1. Recent TIA - now on Eliquis  2. AF with RVR - checking EKG today -  Only fair rate control but it is because she is off her beta blocker and CCB. Restarting today along with her Eliquis. Her CHADSVASC is at least a 6 with a 9.8%  stroke risk per year. This will be  quite challenging given her prior bleeding. Will need to follow closely and I have told this to her today.   3. Anticoagulation with Eliquis  She will follow up with her PCP regarding her other medicines (Ultram, Neurontin, etc). See back in 6 weeks.   Needs follow up labs today.   Patient is agreeable to this plan and will call if any problems develop in the interim.   Rosalio Macadamia, RN, ANP-C [facility] [address] [place], [place]  [address] [phone]

## 2014-01-09 NOTE — Patient Instructions (Signed)
We will be checking the following labs today BMET and CBC  I am refilling the Metoprolol, Diltiazem and Eliquis today - these are at your drug store - please restart  See Dr. Clifton JamesMcAlhany in 6 weeks  Schedule an OV with Dr. Bruna PotterBlount  Call the [facility] office at [phone] if you have any questions, problems or concerns.

## 2014-01-10 ENCOUNTER — Encounter: Payer: Self-pay | Admitting: Nurse Practitioner

## 2014-01-10 ENCOUNTER — Ambulatory Visit (INDEPENDENT_AMBULATORY_CARE_PROVIDER_SITE_OTHER): Payer: Medicare Other | Admitting: Nurse Practitioner

## 2014-01-10 ENCOUNTER — Encounter (INDEPENDENT_AMBULATORY_CARE_PROVIDER_SITE_OTHER): Payer: Self-pay

## 2014-01-10 VITALS — BP 110/78 | HR 112 | Temp 98.6°F | Ht 66.0 in | Wt 223.0 lb

## 2014-01-10 DIAGNOSIS — I482 Chronic atrial fibrillation, unspecified: Secondary | ICD-10-CM

## 2014-01-10 DIAGNOSIS — I639 Cerebral infarction, unspecified: Secondary | ICD-10-CM | POA: Insufficient documentation

## 2014-01-10 DIAGNOSIS — G8191 Hemiplegia, unspecified affecting right dominant side: Secondary | ICD-10-CM

## 2014-01-10 DIAGNOSIS — G8190 Hemiplegia, unspecified affecting unspecified side: Secondary | ICD-10-CM

## 2014-01-10 DIAGNOSIS — I6340 Cerebral infarction due to embolism of unspecified cerebral artery: Secondary | ICD-10-CM

## 2014-01-10 NOTE — Patient Instructions (Signed)
Continue eliquis (apixaban) for atrial fibrillation and as secondary stroke prevention and maintain strict control of hypertension with blood pressure goal below 130/90, diabetes with hemoglobin A1c goal below 6.5% and lipids with LDL cholesterol goal below 70 mg/dL. Followup in the future in 3-4 months, sooner as needed.   Stroke Prevention Some medical conditions and behaviors are associated with an increased chance of having a stroke. You may prevent a stroke by making healthy choices and managing medical conditions. HOW CAN I REDUCE MY RISK OF HAVING A STROKE?   Stay physically active. Get at least 30 minutes of activity on most or all days.  Do not smoke. It may also be helpful to avoid exposure to secondhand smoke.  Limit alcohol use. Moderate alcohol use is considered to be:  No more than 2 drinks per day for men.  No more than 1 drink per day for nonpregnant women.  Eat healthy foods. This involves:  Eating 5 or more servings of fruits and vegetables a day.  Making dietary changes that address high blood pressure (hypertension), high cholesterol, diabetes, or obesity.  Manage your cholesterol levels.  Making food choices that are high in fiber and low in saturated fat, trans fat, and cholesterol may control cholesterol levels.  Take any prescribed medicines to control cholesterol as directed by your health care provider.  Manage your diabetes.  Controlling your carbohydrate and sugar intake is recommended to manage diabetes.  Take any prescribed medicines to control diabetes as directed by your health care provider.  Control your hypertension.  Making food choices that are low in salt (sodium), saturated fat, trans fat, and cholesterol is recommended to manage hypertension.  Take any prescribed medicines to control hypertension as directed by your health care provider.  Maintain a healthy weight.  Reducing calorie intake and making food choices that are low in  sodium, saturated fat, trans fat, and cholesterol are recommended to manage weight.  Stop drug abuse.  Avoid taking birth control pills.  Talk to your health care provider about the risks of taking birth control pills if you are over 31 years old, smoke, get migraines, or have ever had a blood clot.  Get evaluated for sleep disorders (sleep apnea).  Talk to your health care provider about getting a sleep evaluation if you snore a lot or have excessive sleepiness.  Take medicines only as directed by your health care provider.  For some people, aspirin or blood thinners (anticoagulants) are helpful in reducing the risk of forming abnormal blood clots that can lead to stroke. If you have the irregular heart rhythm of atrial fibrillation, you should be on a blood thinner unless there is a good reason you cannot take them.  Understand all your medicine instructions.  Make sure that other conditions (such as anemia or atherosclerosis) are addressed. SEEK IMMEDIATE MEDICAL CARE IF:   You have sudden weakness or numbness of the face, arm, or leg, especially on one side of the body.  Your face or eyelid droops to one side.  You have sudden confusion.  You have trouble speaking (aphasia) or understanding.  You have sudden trouble seeing in one or both eyes.  You have sudden trouble walking.  You have dizziness.  You have a loss of balance or coordination.  You have a sudden, severe headache with no known cause.  You have new chest pain or an irregular heartbeat. Any of these symptoms may represent a serious problem that is an emergency. Do not wait to  see if the symptoms will go away. Get medical help at once. Call your local emergency services (911 in [place] Do not drive yourself to the hospital. Document Released: 04/30/2004 Document Revised: 08/07/2013 Document Reviewed: 09/23/2012 [facility] Patient Information 2015 [place], [place]. This information is not intended to replace advice  given to you by your health care provider. Make sure you discuss any questions you have with your health care provider.

## 2014-01-10 NOTE — Progress Notes (Signed)
Agree with above 

## 2014-01-10 NOTE — Progress Notes (Signed)
PATIENT: Robin Snow DOB: [birth year]  REASON FOR VISIT: hospital follow up for stroke HISTORY FROM: patient  HISTORY OF PRESENT ILLNESS: Robin Snow is a 73 y.o. female who comes to the office for first hospital follow up post hospital discharge for stroke. She has a history of A. fib who is not on anticoagulation do to history of GI bleeds from diverticular source. She went to bed in her normal state of health around 9 PM 11/28/2013, and then awoke with inability to move her right arm. She called out to her family called 911 and she was brought in by EMS on 11/29/2013. Since EMS arrival to the scene, she has had significant improvement in her right arm. She still does have some persistent numbness. Patient was not administered TPA secondary to delay in arrival. She was admitted for further evaluation and treatment.  MRI showed Punctate acute infarction at the right inferior pons. Moderate chronic small-vessel changes affecting the cerebral hemispheric white matter. Normal MR angiography of the large and medium size vessels.  Carotid Dopplers showed 1-39% bilateral stenosis. LDL was 62 and Hgb a1c was 5.7 on Glipizide. She has history of diverticular bleed x 2 while on warfarin which has been discontinued. She had an colonoscopy but no culprit lesion was found. She has done well on aspirin without bleeding.  Blood pressure is well controlled, it is 110/78 in the office today. She feels she is back to her baseline from stroke, is only having problems with chronic right knee pain. She is getting Home PT for this. She has no new complaints today.  REVIEW OF SYSTEMS: Full 14 system review of systems performed and notable only for: walking difficulty   ALLERGIES: Allergies  Allergen Reactions  . Aspirin Hives    Patient takes this at home  . Hydrocodone Hives    Tolerates oxycodone and tramadol  . Rofecoxib Hives    HOME MEDICATIONS: Outpatient Prescriptions Prior to Visit    Medication Sig Dispense Refill  . apixaban (ELIQUIS) 5 MG TABS tablet Take 1 tablet (5 mg total) by mouth 2 (two) times daily.  180 tablet  3  . cloNIDine (CATAPRES) 0.1 MG tablet Take 1 tablet (0.1 mg total) by mouth 2 (two) times daily.  60 tablet  11  . diclofenac sodium (VOLTAREN) 1 % GEL Apply 4 g topically 4 (four) times daily as needed (for pain).      [name] diltiazem (CARDIZEM CD) 180 MG 24 hr capsule Take 1 capsule (180 mg total) by mouth daily.  90 capsule  3  . furosemide (LASIX) 40 MG tablet Take 40 mg by mouth daily as needed (swelling).      . gabapentin (NEURONTIN) 100 MG capsule Take 100 mg by mouth at bedtime.      [name] glimepiride (AMARYL) 2 MG tablet Take 2 mg by mouth daily before breakfast.      . hydrochlorothiazide (MICROZIDE) 12.5 MG capsule Take 12.5 mg by mouth daily.      [name] lisinopril (PRINIVIL,ZESTRIL) 20 MG tablet Take 1 tablet (20 mg total) by mouth daily.      . metoprolol tartrate (LOPRESSOR) 25 MG tablet Take 0.5 tablets (12.5 mg total) by mouth 2 (two) times daily.  90 each  3  . oxybutynin (DITROPAN-XL) 5 MG 24 hr tablet Take 5 mg by mouth daily.      . potassium chloride (KLOR-CON) 20 MEQ packet Take 20 mEq by mouth daily.      . potassium  chloride SA (K-DUR,KLOR-CON) 20 MEQ tablet Take 20 mEq by mouth daily.       . traMADol (ULTRAM) 50 MG tablet Take 2 tablets (100 mg total) by mouth every 6 (six) hours as needed for moderate pain or severe pain.  120 tablet  0   No facility-administered medications prior to visit.    PHYSICAL EXAM Filed Vitals:   01/10/14 1448  BP: 110/78  Pulse: 112  Temp: 98.6 F (37 C)  TempSrc: Oral  Height: 5\' 6"  (1.676 m)  Weight: 223 lb (101.152 kg)   Body mass index is 36.01 kg/(m^2).  Generalized: Well developed, in no acute distress  Head: normocephalic and atraumatic. Oropharynx benign  Neck: Supple, no carotid bruits  Cardiac: Regular rate rhythm, no murmur  Musculoskeletal: No deformity   Neurological examination   Mentation: Alert oriented to time, place, history taking. Follows all commands speech and language fluent Cranial nerve II-XII: Fundoscopic exam not done. Pupils were equal round reactive to light extraocular movements were full, visual field were full on confrontational test. Facial sensation and strength were normal. hearing was intact to finger rubbing bilaterally. Uvula tongue midline. head turning and shoulder shrug and were normal and symmetric.Tongue protrusion into cheek strength was normal. Motor: The motor testing reveals 5 over 5 strength of all 4 extremities. Good symmetric motor tone is noted throughout. Mild diminished fine finger movements on right. Orbits left over right upper extremity.  Sensory: Sensory testing is intact to soft touch on all 4 extremities. No evidence of extinction is noted.  Coordination: Cerebellar testing reveals good finger-nose-finger and heel-to-shin not possible due to knee pain on right, left normal. Gait and station: Gait is antalgic. Tandem gait is not possible. Romberg is negative. Reflexes: Deep tendon reflexes are symmetric and normal bilaterally.  NIHSS: 0 MRs: 1  DIAGNOSTIC DATA (LABS, IMAGING, TESTING) - I reviewed patient records, labs, notes, testing and imaging myself where available.  Lab Results  Component Value Date   WBC 6.3 01/09/2014   HGB 9.6* 01/09/2014   HCT 29.5* 01/09/2014   MCV 86.4 01/09/2014   PLT 214.0 01/09/2014      Component Value Date/Time   NA 139 01/09/2014 1500   K 3.9 01/09/2014 1500   CL 104 01/09/2014 1500   CO2 29 01/09/2014 1500   GLUCOSE 100* 01/09/2014 1500   BUN 16 01/09/2014 1500   CREATININE 1.1 01/09/2014 1500   CALCIUM 8.9 01/09/2014 1500   PROT 9.0* 11/29/2013 0432   ALBUMIN 3.1* 11/29/2013 0432   AST 24 11/29/2013 0432   ALT 13 11/29/2013 0432   ALKPHOS 98 11/29/2013 0432   BILITOT 0.7 11/29/2013 0432   GFRNONAA 81* 11/29/2013 0432   GFRAA >90 11/29/2013 0432   Lab Results  Component Value Date   CHOL 109  11/30/2013   HDL 33* 11/30/2013   LDLCALC 62 11/30/2013   TRIG 68 11/30/2013   CHOLHDL 3.3 11/30/2013   Lab Results  Component Value Date   HGBA1C 5.7* 11/29/2013   Lab Results  Component Value Date   TSH 0.938 11/13/2013    ASSESSMENT: Ms. Buddy DutyBarbara P Farquhar is a 73 y.o. female with hx of atrial fibrillation not an anticoagulation secondary to hx GIB from diverticular source presenting with right arm hemiparesis. She did not receive IV t-PA due to delay in arrival. MRI shows small punctate right pontine infarct cardioembolic from atrial fibrillation not on anticoagulation. Resultant right hemiparesis resolved.  PLAN: I had a long discussion with the patient and  family regarding her recent stroke, discussed results of evaluation in the hospital and answered questions. Continue eliquis (apixaban) for atrial fibrillation and as secondary stroke prevention and maintain strict control of hypertension with blood pressure goal below 130/90, diabetes with hemoglobin A1c goal below 6.5% and lipids with LDL cholesterol goal below 70 mg/dL. Followup in the future in 3-4 months, sooner as needed.  Tawny Asal LAM, MSN, FNP-BC, A/GNP-C 01/10/2014, 3:50 PM [facility] [address] [place], [place] [address] [phone]  Note: This document was prepared with digital dictation and possible smart phrase technology. Any transcriptional errors that result from this process are unintentional.

## 2014-01-11 ENCOUNTER — Telehealth: Payer: Self-pay | Admitting: *Deleted

## 2014-01-11 NOTE — Telephone Encounter (Signed)
Patients daughter called and stated that the patient has been out of the eliquis for about one month. An rx was sent to the pharmacy at her recent ov, but she is unable to refill it as the medication needs prior authorization. [facility] is faxing the form over for this. One month samples left at the front desk for daughter to pick up. Thanks, MI

## 2014-01-11 NOTE — Telephone Encounter (Signed)
Spoke with [facility] and prior auth completed. Approval received for one year. Pharmacy notified. Message left on pt's identified voicemail that prior Berkley Harveyauth has been done.

## 2014-01-12 ENCOUNTER — Telehealth: Payer: Self-pay | Admitting: Nurse Practitioner

## 2014-01-12 ENCOUNTER — Other Ambulatory Visit: Payer: Self-pay | Admitting: *Deleted

## 2014-01-12 DIAGNOSIS — D649 Anemia, unspecified: Secondary | ICD-10-CM

## 2014-01-12 NOTE — Telephone Encounter (Signed)
Follow up ° ° ° ° °Want lab results °

## 2014-01-12 NOTE — Telephone Encounter (Signed)
New message      Patient calling stating someone called her this am regarding lab work

## 2014-01-16 NOTE — Telephone Encounter (Signed)
Lab results given to pt/tmj

## 2014-01-22 ENCOUNTER — Other Ambulatory Visit (INDEPENDENT_AMBULATORY_CARE_PROVIDER_SITE_OTHER): Payer: Medicare Other | Admitting: *Deleted

## 2014-01-22 DIAGNOSIS — D649 Anemia, unspecified: Secondary | ICD-10-CM

## 2014-01-22 LAB — CBC WITH DIFFERENTIAL/PLATELET
Basophils Absolute: 0.1 10*3/uL (ref 0.0–0.1)
Basophils Relative: 0.8 % (ref 0.0–3.0)
Eosinophils Absolute: 0.1 10*3/uL (ref 0.0–0.7)
Eosinophils Relative: 1.3 % (ref 0.0–5.0)
HCT: 31.6 % — ABNORMAL LOW (ref 36.0–46.0)
Hemoglobin: 10.3 g/dL — ABNORMAL LOW (ref 12.0–15.0)
Lymphocytes Relative: 35 % (ref 12.0–46.0)
Lymphs Abs: 2.7 10*3/uL (ref 0.7–4.0)
MCHC: 32.5 g/dL (ref 30.0–36.0)
MCV: 86.3 fl (ref 78.0–100.0)
Monocytes Absolute: 0.7 10*3/uL (ref 0.1–1.0)
Monocytes Relative: 9.7 % (ref 3.0–12.0)
Neutro Abs: 4 10*3/uL (ref 1.4–7.7)
Neutrophils Relative %: 53.2 % (ref 43.0–77.0)
Platelets: 211 10*3/uL (ref 150.0–400.0)
RBC: 3.66 Mil/uL — ABNORMAL LOW (ref 3.87–5.11)
RDW: 15.1 % (ref 11.5–15.5)
WBC: 7.6 10*3/uL (ref 4.0–10.5)

## 2014-01-25 ENCOUNTER — Ambulatory Visit: Payer: Medicare Other | Admitting: Neurology

## 2014-02-21 ENCOUNTER — Encounter: Payer: Self-pay | Admitting: Neurology

## 2014-02-26 ENCOUNTER — Other Ambulatory Visit: Payer: Self-pay | Admitting: Family Medicine

## 2014-02-26 ENCOUNTER — Ambulatory Visit (INDEPENDENT_AMBULATORY_CARE_PROVIDER_SITE_OTHER): Payer: Medicare Other | Admitting: Nurse Practitioner

## 2014-02-26 ENCOUNTER — Encounter: Payer: Self-pay | Admitting: Nurse Practitioner

## 2014-02-26 VITALS — BP 108/60 | HR 82 | Ht 67.0 in | Wt 222.6 lb

## 2014-02-26 DIAGNOSIS — G459 Transient cerebral ischemic attack, unspecified: Secondary | ICD-10-CM

## 2014-02-26 DIAGNOSIS — I482 Chronic atrial fibrillation, unspecified: Secondary | ICD-10-CM

## 2014-02-26 DIAGNOSIS — Z79899 Other long term (current) drug therapy: Secondary | ICD-10-CM

## 2014-02-26 LAB — BASIC METABOLIC PANEL
BUN: 10 mg/dL (ref 6–23)
CO2: 28 mEq/L (ref 19–32)
Calcium: 9.2 mg/dL (ref 8.4–10.5)
Chloride: 101 mEq/L (ref 96–112)
Creatinine, Ser: 1 mg/dL (ref 0.4–1.2)
GFR: 70.42 mL/min (ref >60.00)
Glucose, Bld: 57 mg/dL — ABNORMAL LOW (ref 70–99)
Potassium: 3.6 mEq/L (ref 3.5–5.1)
Sodium: 139 mEq/L (ref 135–145)

## 2014-02-26 LAB — CBC
HCT: 32.5 % — ABNORMAL LOW (ref 36.0–46.0)
Hemoglobin: 10.5 g/dL — ABNORMAL LOW (ref 12.0–15.0)
MCHC: 32.2 g/dL (ref 30.0–36.0)
MCV: 85.2 fl (ref 78.0–100.0)
Platelets: 149 10*3/uL — ABNORMAL LOW (ref 150.0–400.0)
RBC: 3.82 Mil/uL — ABNORMAL LOW (ref 3.87–5.11)
RDW: 15.6 % — ABNORMAL HIGH (ref 11.5–15.5)
WBC: 6.4 10*3/uL (ref 4.0–10.5)

## 2014-02-26 NOTE — Progress Notes (Signed)
Robin Snow Date of Birth: [birth year] Medical Record #[id number]  History of Present Illness: Robin Snow is seen back today for a 6 week visit. Seen for Dr. Clifton James. She is a 73 year old female with AF, OA, HTN, type 2 DM, GERD, and neuropathy. She had a negative cath in 2012 showing normal coronaries. No cardiology follow up since 2012 until earlier this fall. .   She has had a history of anemia with past GI bleeding and has required transfusion. She has previously been on coumadin in the past but was stopped due to past bleeding - last bleed noted to be in February of 2015. Last colonoscopy in February of 2015.   Hospitalized 2 months ago with a TIA - found to be in her chronic AF but with RVR - Eliquis was added - she had no trouble taking this at discharge.   I saw her 6 weeks ago  - she was out of medicines. Got her restarted on her medicines. Have gotten her prior auth for her Eliquis.   Comes in today. Here with her caregiver.  She is doing well. No chest pain. Breathing ok. Still limited by her arthritis. No bleeding. No palpitations. No heart racing. She feels like she is doing well. Having some issues with her PCP and would like to see about changing if possible.   Current Outpatient Prescriptions  Medication Sig Dispense Refill  . apixaban (ELIQUIS) 5 MG TABS tablet Take 1 tablet (5 mg total) by mouth 2 (two) times daily. 180 tablet 3  . cloNIDine (CATAPRES) 0.1 MG tablet Take 1 tablet (0.1 mg total) by mouth 2 (two) times daily. 60 tablet 11  . diclofenac sodium (VOLTAREN) 1 % GEL Apply 4 g topically 4 (four) times daily as needed (for pain).    [name] diltiazem (CARDIZEM CD) 180 MG 24 hr capsule Take 1 capsule (180 mg total) by mouth daily. 90 capsule 3  . docusate sodium (COLACE) 100 MG capsule Take 100 mg by mouth daily.    . furosemide (LASIX) 40 MG tablet Take 40 mg by mouth daily as needed (swelling).    . gabapentin (NEURONTIN) 100 MG capsule Take 100 mg by mouth at  bedtime.    [name] glimepiride (AMARYL) 2 MG tablet Take 2 mg by mouth daily before breakfast.    . hydrochlorothiazide (MICROZIDE) 12.5 MG capsule Take 12.5 mg by mouth daily.    Melene Muller ON 03/10/2014] HYDROmorphone (DILAUDID) 2 MG tablet Take 2 mg by mouth as needed.     [name] lisinopril (PRINIVIL,ZESTRIL) 20 MG tablet Take 1 tablet (20 mg total) by mouth daily.    [name] lovastatin (MEVACOR) 20 MG tablet Take 20 mg by mouth at bedtime.     . metoprolol tartrate (LOPRESSOR) 25 MG tablet Take 0.5 tablets (12.5 mg total) by mouth 2 (two) times daily. 90 each 3  . oxybutynin (DITROPAN-XL) 5 MG 24 hr tablet Take 5 mg by mouth daily.    . potassium chloride (KLOR-CON) 20 MEQ packet Take 20 mEq by mouth daily.    . potassium chloride SA (K-DUR,KLOR-CON) 20 MEQ tablet Take 20 mEq by mouth daily.     . traMADol (ULTRAM) 50 MG tablet Take 2 tablets (100 mg total) by mouth every 6 (six) hours as needed for moderate pain or severe pain. 120 tablet 0   No current facility-administered medications for this visit.    Allergies  Allergen Reactions  . Aspirin Hives    Patient takes this  at home  . Hydrocodone Hives    Tolerates oxycodone and tramadol  . Rofecoxib Hives    Past Medical History  Diagnosis Date  . Atrial fibrillation   . Hypertension   . Arthritis   . Coagulopathy   . Diabetes mellitus     Type 2  . GI bleed   . Pneumonia     2-3 years ago  . GERD (gastroesophageal reflux disease)   . Anemia   . Hx of transfusion of packed red blood cells   . Diverticulosis   . Anticoagulant drug declined     due to hx GI bleeding    Past Surgical History  Procedure Laterality Date  . Cesarean section    . Replacement total knee bilateral    . Tubal ligation    . Esophagogastroduodenoscopy Left 01/23/2013    Procedure: ESOPHAGOGASTRODUODENOSCOPY (EGD);  Surgeon: Willis Modena, MD;  Location: [facility] ENDOSCOPY;  Service: Endoscopy;  Laterality: Left;  . Colonoscopy Left 01/24/2013    Procedure:  COLONOSCOPY;  Surgeon: Willis Modena, MD;  Location: [facility] ENDOSCOPY;  Service: Endoscopy;  Laterality: Left;  [name] Eye surgery Bilateral     cataract surgery  . Joint replacement      History  Smoking status  . Former Smoker  . Types: Cigarettes  . Quit date: 07/07/1993  Smokeless tobacco  . Never Used    History  Alcohol Use No    Comment: no longer drinks alcohol    Family History  Problem Relation Age of Onset  . Cancer Father     Review of Systems: The review of systems is per the HPI.  All other systems were reviewed and are negative.  Physical Exam: BP 108/60 mmHg  Pulse 82  Ht 5\' 7"  (1.702 m)  Wt 222 lb 9.6 oz (100.971 kg)  BMI 34.86 kg/m2 Patient is very pleasant and in no acute distress. Skin is warm and dry. Color is normal.  HEENT is unremarkable. Normocephalic/atraumatic. PERRL. Sclera are nonicteric. Neck is supple. No masses. No JVD. Lungs are clear. Cardiac exam shows a regular rate and rhythm. Abdomen is soft. Extremities are without edema. Gait and ROM are intact. No gross neurologic deficits noted.  Wt Readings from Last 3 Encounters:  02/26/14 222 lb 9.6 oz (100.971 kg)  01/10/14 223 lb (101.152 kg)  01/09/14 226 lb 6.4 oz (102.694 kg)    LABORATORY DATA/PROCEDURES: EKG today with chronic AF but with a more controlled VR. Rate is 82.    Lab Results  Component Value Date   WBC 7.6 01/22/2014   HGB 10.3* 01/22/2014   HCT 31.6* 01/22/2014   PLT 211.0 01/22/2014   GLUCOSE 100* 01/09/2014   CHOL 109 11/30/2013   TRIG 68 11/30/2013   HDL 33* 11/30/2013   LDLCALC 62 11/30/2013   ALT 13 11/29/2013   AST 24 11/29/2013   NA 139 01/09/2014   K 3.9 01/09/2014   CL 104 01/09/2014   CREATININE 1.1 01/09/2014   BUN 16 01/09/2014   CO2 29 01/09/2014   TSH 0.938 11/13/2013   INR 1.22 11/29/2013   HGBA1C 5.7* 11/29/2013    BNP (last 3 results)  Recent Labs  11/13/13 1415  PROBNP 683.9*   Echo Study Conclusions from August 2015  - Left  ventricle: The cavity size was normal. Wall thickness was increased in a pattern of mild LVH. Systolic function was normal. The estimated ejection fraction was in the range of 60% to 65%. Wall motion was normal; there were  no regional wall motion abnormalities. Indeterminant diastolic function (atrial fibrillation). - Aortic valve: There was no stenosis. - Mitral valve: There was trivial regurgitation. - Left atrium: The atrium was severely dilated. - Right ventricle: The cavity size was normal. Systolic function was normal. - Right atrium: The atrium was severely dilated. - Tricuspid valve: Peak RV-RA gradient (S): 25 mm Hg. - Pulmonary arteries: PA peak pressure: 33 mm Hg (S). - Systemic veins: IVC measured 2.0 cm with < 50% respirophasic variation, suggesting RA pressure 8 mmHg.  Impressions:  - The patient was in atrial fibrillation. Normal LV size with mild LV hypertrophy. EF 60-65%. Normal RV size and systolic function. Severe biatrial enlargement.  Assessment / Plan: 1. Recent TIA - now on Eliquis  2. AF with RVR - better rate control by EKG today - back on her beta blocker and CCB. Remains on Eliquis as well.   Her CHADSVASC is at least a 6 with a 9.8% stroke risk per year. No active bleeding noted. Rechecking her labs today as well.  3. Anticoagulation with Eliquis  See back in 3 to 4 months. Will try to arrange primary care. Recheck labs today.   Patient is agreeable to this plan and will call if any problems develop in the interim.   Rosalio Macadamia, RN, ANP-C [facility] [address] [place], [place]  [address] [phone]

## 2014-02-26 NOTE — Patient Instructions (Addendum)
We will be checking the following labs today - BMET and CBC  Stay on your current medicines  We will try to arrange for a new primary care MD  See Dr. Clifton JamesMcAlhany in 3 to 4 months  Call the [facility] office at [phone] if you have any questions, problems or concerns.

## 2014-02-28 ENCOUNTER — Other Ambulatory Visit: Payer: Self-pay | Admitting: Family Medicine

## 2014-03-15 ENCOUNTER — Other Ambulatory Visit: Payer: Self-pay | Admitting: *Deleted

## 2014-03-15 ENCOUNTER — Other Ambulatory Visit: Payer: Self-pay | Admitting: Family Medicine

## 2014-03-15 MED ORDER — FUROSEMIDE 40 MG PO TABS: 40.0000 mg | ORAL_TABLET | Freq: Every day | ORAL | Status: DC | PRN

## 2014-04-25 ENCOUNTER — Other Ambulatory Visit: Payer: Self-pay | Admitting: Family Medicine

## 2014-04-27 ENCOUNTER — Other Ambulatory Visit: Payer: Self-pay | Admitting: Family Medicine

## 2014-05-01 ENCOUNTER — Other Ambulatory Visit: Payer: Self-pay

## 2014-05-01 NOTE — Telephone Encounter (Signed)
Pharmacy called requesting refill on Klor-Con. Please advise.

## 2014-05-09 ENCOUNTER — Ambulatory Visit: Payer: Medicare Other | Admitting: Internal Medicine

## 2014-05-11 ENCOUNTER — Other Ambulatory Visit (INDEPENDENT_AMBULATORY_CARE_PROVIDER_SITE_OTHER): Payer: Medicare Other

## 2014-05-11 ENCOUNTER — Ambulatory Visit (INDEPENDENT_AMBULATORY_CARE_PROVIDER_SITE_OTHER): Payer: Medicare Other | Admitting: Internal Medicine

## 2014-05-11 ENCOUNTER — Encounter: Payer: Self-pay | Admitting: Internal Medicine

## 2014-05-11 VITALS — BP 142/94 | HR 98 | Temp 98.4°F | Ht 67.0 in | Wt 230.1 lb

## 2014-05-11 DIAGNOSIS — I482 Chronic atrial fibrillation, unspecified: Secondary | ICD-10-CM

## 2014-05-11 DIAGNOSIS — M25561 Pain in right knee: Secondary | ICD-10-CM

## 2014-05-11 DIAGNOSIS — E1149 Type 2 diabetes mellitus with other diabetic neurological complication: Secondary | ICD-10-CM

## 2014-05-11 DIAGNOSIS — I10 Essential (primary) hypertension: Secondary | ICD-10-CM

## 2014-05-11 DIAGNOSIS — I872 Venous insufficiency (chronic) (peripheral): Secondary | ICD-10-CM | POA: Insufficient documentation

## 2014-05-11 DIAGNOSIS — E1140 Type 2 diabetes mellitus with diabetic neuropathy, unspecified: Secondary | ICD-10-CM

## 2014-05-11 LAB — LIPID PANEL
CHOL/HDL RATIO: 5
Cholesterol: 151 mg/dL (ref 0–200)
HDL: 32.2 mg/dL — AB (ref >39.00)
LDL Cholesterol: 92 mg/dL (ref 0–99)
NONHDL: 118.8
Triglycerides: 135 mg/dL (ref 0.0–149.0)
VLDL: 27 mg/dL (ref 0.0–40.0)

## 2014-05-11 LAB — HEMOGLOBIN A1C: HEMOGLOBIN A1C: 5.7 % (ref 4.6–6.5)

## 2014-05-11 MED ORDER — MIRABEGRON ER 25 MG PO TB24: 25.0000 mg | ORAL_TABLET | Freq: Every day | ORAL | Status: DC

## 2014-05-11 NOTE — Assessment & Plan Note (Addendum)
BP seems reasonably controlled on current regimen. Stop lasix and potassium as it is likely not helping her swelling and causing her to have incontinence. Continue HCTZ, diltiazem, lopressor, clonidine, lisinopril. Check BMP.

## 2014-05-11 NOTE — Assessment & Plan Note (Signed)
Patient unable to afford eliquis ($165 /83months per patient). Will forward to cardiologist to see if PA can be done (if not already) or if other options available. She sees them on May 18 2014. Also on diltiazem and lopressor. In a fib today. Given the severe enlargement of the atria on echo likely will be permanent. Needs NOAC or coumadin (has bled on this several times in the past) given hx stroke.

## 2014-05-11 NOTE — Assessment & Plan Note (Signed)
She is on glimepiride for this and check HgA1c today. Last checked some time ago. Complicated by some numbness in her feet. Is on ACE-I.

## 2014-05-11 NOTE — Patient Instructions (Addendum)
We will have you go down to the lab in the basement to get your blood work done.   We will have you stop taking lasix and stop taking potassium pills.   We will give you a prescription for the compression stockings to help with the fluid in your legs. Also try to keep the legs elevated if you are just sitting.   We will send a note to the cardiologist's office to see if they will check into the coverage for the eliquis.

## 2014-05-11 NOTE — Progress Notes (Signed)
Pre visit review using our clinic review tool, if applicable. No additional management support is needed unless otherwise documented below in the visit note. 

## 2014-05-11 NOTE — Assessment & Plan Note (Signed)
The knee is swollen and hurting her. She will go to orthopedics for treatment.

## 2014-05-11 NOTE — Assessment & Plan Note (Signed)
Stop lasix, rx for compression stockings given today.

## 2014-05-11 NOTE — Progress Notes (Signed)
   Subjective:    Patient ID: Robin Snow, female Robin Snow   DOB: 11-23-1940, 74 y.o.   MRN: [id number]  HPI The patient is a 74 YO female who is coming in as a new patient to discuss leg swelling. She has been on lasix for many years for the leg swelling but feels it is not effective anymore. It also makes her urinate on herself as she is not mobile enough to get to the bathroom in time. Her swelling is bilateral and has been getting worse in the last 2-3 weeks. No other changes to her medicines. No associated rash or fevers. She also has PMH of a fib (stable, hx stroke as complication, on eliquis and beta blocker and diltiazem), DM type 2 (stable, on glimepiride complicated by the edema), HTN (stable on diltiazem, lopressor, hctz, lasix, clonidine).   PMH, FMH, SMH, PSH, allergies, medications, problem list updated.  Review of Systems  Constitutional: Negative for fever, activity change, appetite change, fatigue and unexpected weight change.  HENT: Negative.   Eyes: Negative.   Respiratory: Negative for cough, chest tightness, shortness of breath and wheezing.   Cardiovascular: Positive for leg swelling. Negative for chest pain and palpitations.  Gastrointestinal: Negative.   Endocrine: Negative.   Genitourinary: Positive for frequency.  Musculoskeletal: Positive for myalgias, arthralgias and gait problem.  Skin: Negative.   Neurological: Positive for numbness. Negative for dizziness, weakness and headaches.       In her feet  Psychiatric/Behavioral: Negative.       Objective:   Physical Exam  Constitutional: She is oriented to person, place, and time. She appears well-developed and well-nourished.  Obese  HENT:  Head: Normocephalic and atraumatic.  Eyes: EOM are normal.  Neck: Normal range of motion.  Cardiovascular: Normal rate.   Irregularly irregular  Pulmonary/Chest: Effort normal and breath sounds normal. No respiratory distress. She has no wheezes. She has no rales.    Abdominal: Soft. Bowel sounds are normal. She exhibits no distension. There is no tenderness.  Musculoskeletal: She exhibits edema.  Bilateral 2+ edema to knees  Neurological: She is alert and oriented to person, place, and time. Coordination normal.  Skin: Skin is warm and dry.   Filed Vitals:   05/11/14 1439  BP: 142/94  Pulse: 98  Temp: 98.4 F (36.9 C)  TempSrc: Oral  Height: 5\' 7"  (1.702 m)  Weight: 230 lb 2 oz (104.384 kg)  SpO2: 97%      Assessment & Plan:

## 2014-05-14 ENCOUNTER — Ambulatory Visit (INDEPENDENT_AMBULATORY_CARE_PROVIDER_SITE_OTHER): Payer: Medicare Other | Admitting: Neurology

## 2014-05-14 ENCOUNTER — Encounter: Payer: Self-pay | Admitting: Neurology

## 2014-05-14 ENCOUNTER — Encounter: Payer: Self-pay | Admitting: *Deleted

## 2014-05-14 ENCOUNTER — Ambulatory Visit: Payer: Medicare Other | Admitting: Nurse Practitioner

## 2014-05-14 VITALS — BP 121/74 | HR 90

## 2014-05-14 DIAGNOSIS — I482 Chronic atrial fibrillation, unspecified: Secondary | ICD-10-CM

## 2014-05-14 DIAGNOSIS — E785 Hyperlipidemia, unspecified: Secondary | ICD-10-CM

## 2014-05-14 DIAGNOSIS — I10 Essential (primary) hypertension: Secondary | ICD-10-CM

## 2014-05-14 DIAGNOSIS — I6340 Cerebral infarction due to embolism of unspecified cerebral artery: Secondary | ICD-10-CM

## 2014-05-14 DIAGNOSIS — I639 Cerebral infarction, unspecified: Secondary | ICD-10-CM

## 2014-05-14 NOTE — Patient Instructions (Addendum)
-   continue eliquis and lovastatin for stroke prevention - recommend to go to eliquis website for sign up of coupon - contact PCP for process of insurance approval of eliquis discount - check BP at home and glucose  - Follow up with your primary care physician for stroke risk factor modification. Recommend maintain blood pressure goal <130/80, diabetes with hemoglobin A1c goal below 6.5% and lipids with LDL cholesterol goal below 70 mg/dL.  - follow up in 3 months

## 2014-05-17 NOTE — Progress Notes (Signed)
STROKE NEUROLOGY FOLLOW UP NOTE  NAME: Robin Snow DOB: 18-Jan-1941  REASOBuddy Duty FOR VISIT: stroke follow up HISTORY FROM: pt and chart  Today we had the pleasure of seeing [name] in follow-up at our Neurology Clinic. Pt was accompanied by daughter.   History Summary [name] is a 74 y.o. female with history of HTN, DM, afib not on anticoagulation due to history of GIB from diverticular source. She had acute onset right arm weakness on 11/29/2013 on waking up. On EMS arrival to the scene, she has had significant improvement in her right arm. Patient was not administered TPA secondary to delay in arrival. She was admitted for further evaluation and treatment.MRI showed punctate acute infarction at the right inferior pons. Normal MRA. Carotid Dopplers unremarkable. LDL was 62 and Hgb a1c 5.7 on Glipizide. She has history of diverticular bleed x 2 while on warfarin which has been discontinued. She had an colonoscopy but no culprit lesion was found. She was started on Eliquis for stroke prevention.   01/10/14 follow up - Blood pressure is well controlled, it is 110/78 in the office today. She feels she is back to her baseline from stroke, is only having problems with chronic right knee pain. She is getting Home PT for this. She has no new complaints today.  Interval History During the interval time, the patient has been doing well.  No recurrent stroke symptoms. She is on Eliquis now, however she had financial difficulties with continue Eliquis. Showed patient and her daughter the waist to get coupon from [employer] website. Patient also waiting for some financial program support. She stated that her blood pressure and glucose at home was controlled. Her blood pressure 121/74 today.  She came in on wheelchair today because bilaterally knee pain. She denies any weakness of lower extremities.  REVIEW OF SYSTEMS: Full 14 system review of systems performed and notable only for those listed  below and in HPI above, all others are negative:  Constitutional:   Cardiovascular:  Ear/Nose/Throat:   Skin:  Eyes:   Respiratory:   Gastroitestinal:   Genitourinary: Frequency of urination Hematology/Lymphatic:   Endocrine:  Musculoskeletal:  Walking difficulty Allergy/Immunology:   Neurological:   Psychiatric:  Sleep:   The following represents the patient's updated allergies and side effects list: Allergies  Allergen Reactions  . Aspirin Hives    Patient takes this at home  . Hydrocodone Hives    Tolerates oxycodone and tramadol  . Rofecoxib Hives    The neurologically relevant items on the patient's problem list were reviewed on today's visit.  Neurologic Examination  A problem focused neurological exam (12 or more points of the single system neurologic examination, vital signs counts as 1 point, cranial nerves count for 8 points) was performed.  Blood pressure 121/74, pulse 90, height 0' (0 m), weight 0 lb (0 kg).  General - Well nourished, well developed, in no apparent distress.  Ophthalmologic - fundi not visualized due to incorporation.  Cardiovascular - irregularly irregular heart rate and rhythm.  Mental Status -  Level of arousal and orientation to time, place, and person were intact. Language including expression, naming, repetition, comprehension was assessed and found intact.  Cranial Nerves II - XII - II - Visual field intact OU. III, IV, VI - Extraocular movements intact. V - Facial sensation intact bilaterally. VII - Facial movement intact bilaterally. VIII - Hearing & vestibular intact bilaterally. X - Palate elevates symmetrically. XI - Chin turning & shoulder shrug  intact bilaterally. XII - Tongue protrusion intact.  Motor Strength - The patient's strength was normal in upper extremities and pronator drift was absent. Bilateral lower extremities 4/5 proximal and 5/5 distal, however difficulty with knee extension and flexion due to pain.  Bulk was normal and fasciculations were absent.   Motor Tone - Muscle tone was assessed at the neck and appendages and was normal.  Reflexes - The patient's reflexes were 1+ in all extremities and she had no pathological reflexes.  Sensory - Light touch, temperature/pinprick were assessed and were normal.    Coordination - The patient had normal movements in the hands with no ataxia or dysmetria.  Tremor was absent.  Gait and Station - in wheelchair, not tested due to bilateral knee pain.  Data reviewed: I personally reviewed the images and agree with the radiology interpretations.  Ct Head Wo Contrast  11/29/2013 IMPRESSION: No acute intracranial abnormalities. Chronic atrophy and small vessel ischemic changes.  Mri and Mra Brain Without Contrast  11/29/2013 IMPRESSION: Punctate acute infarction at the right inferior pons. Moderate chronic small-vessel changes affecting the cerebral hemispheric white matter. Normal MR angiography of the large and medium size vessels.    CUS - Bilateral: 1-39% ICA stenosis. Vertebral artery flow is antegrade.  2D echo - - The patient was in atrial fibrillation. Normal LV size with mild LV hypertrophy. EF 60-65%. Normal RV size and systolic function. Severe biatrial enlargement.  Component     Latest Ref Rng 11/13/2013 11/29/2013 11/30/2013 05/11/2014  Cholesterol     0 - 200 mg/dL   811 914  Triglycerides     0.0 - 149.0 mg/dL   68 782.9  HDL     >56.21 mg/dL   33 (L) 30.86 (L)  VLDL     0.0 - 40.0 mg/dL   14 57.8  LDL (calc)     0 - 99 mg/dL   62 92  Total CHOL/HDL Ratio        3.3 5  NonHDL         118.80  Hemoglobin A1C     4.6 - 6.5 %  5.7 (H)  5.7  Mean Plasma Glucose     <117 mg/dL  469 (H)    TSH     6.295 - 4.500 uIU/mL 0.938       Assessment: As you may recall, she is a 74 y.o. African American female with PMH of HTN, DM, afib off coumadin due to history of GIB from diverticular source (however colonoscopy negative)  admitted on 11/29/2013 for right arm weakness.MRI showed punctate acute infarction at the right inferior pons. Normal MRA. LDL 62 and Hgb a1c 5.7. She was started on Eliquis for stroke prevention. During the interval time, she continued on eliquis but with difficulty with financial support. Her recent LDL 92.  Plan:  - continue eliquis and lovastatin for stroke prevention - financial information given to pt for continuation of eliquis - check BP and glucose at home - Follow up with your primary care physician for stroke risk factor modification. Recommend maintain blood pressure goal <130/80, diabetes with hemoglobin A1c goal below 6.5% and lipids with LDL cholesterol goal below 70 mg/dL.  - RTC in 3 months.  No orders of the defined types were placed in this encounter.    Meds ordered this encounter  Medications  . oxybutynin (DITROPAN-XL) 5 MG 24 hr tablet    Sig: Take 5 mg by mouth at bedtime.    Patient Instructions  -  continue eliquis and lovastatin for stroke prevention - recommend to go to eliquis website for sign up of coupon - contact PCP for process of insurance approval of eliquis discount - check BP at home and glucose  - Follow up with your primary care physician for stroke risk factor modification. Recommend maintain blood pressure goal <130/80, diabetes with hemoglobin A1c goal below 6.5% and lipids with LDL cholesterol goal below 70 mg/dL.  - follow up in 3 months   Marvel Plan, MD PhD [facility] [address] [place], [place] [address] [phone]

## 2014-05-18 ENCOUNTER — Other Ambulatory Visit: Payer: Self-pay | Admitting: Family Medicine

## 2014-05-18 ENCOUNTER — Ambulatory Visit: Payer: Medicare Other | Admitting: Cardiovascular Disease

## 2014-05-18 ENCOUNTER — Other Ambulatory Visit: Payer: Self-pay | Admitting: Internal Medicine

## 2014-05-18 DIAGNOSIS — E785 Hyperlipidemia, unspecified: Secondary | ICD-10-CM | POA: Insufficient documentation

## 2014-05-18 DIAGNOSIS — I10 Essential (primary) hypertension: Secondary | ICD-10-CM | POA: Insufficient documentation

## 2014-05-21 ENCOUNTER — Other Ambulatory Visit: Payer: Self-pay | Admitting: Internal Medicine

## 2014-05-21 ENCOUNTER — Other Ambulatory Visit: Payer: Self-pay | Admitting: Geriatric Medicine

## 2014-05-21 MED ORDER — DOCUSATE SODIUM 100 MG PO CAPS: 100.0000 mg | ORAL_CAPSULE | Freq: Every day | ORAL | Status: DC

## 2014-05-21 MED ORDER — OXYBUTYNIN CHLORIDE ER 5 MG PO TB24: 5.0000 mg | ORAL_TABLET | Freq: Every day | ORAL | Status: DC

## 2014-05-21 MED ORDER — GLIMEPIRIDE 2 MG PO TABS: 2.0000 mg | ORAL_TABLET | Freq: Every day | ORAL | Status: DC

## 2014-05-22 ENCOUNTER — Ambulatory Visit (INDEPENDENT_AMBULATORY_CARE_PROVIDER_SITE_OTHER): Payer: Medicare Other | Admitting: Cardiovascular Disease

## 2014-05-22 ENCOUNTER — Encounter: Payer: Self-pay | Admitting: Cardiovascular Disease

## 2014-05-22 VITALS — BP 118/62 | HR 85 | Ht 67.0 in | Wt 232.0 lb

## 2014-05-22 DIAGNOSIS — I482 Chronic atrial fibrillation, unspecified: Secondary | ICD-10-CM

## 2014-05-22 DIAGNOSIS — G459 Transient cerebral ischemic attack, unspecified: Secondary | ICD-10-CM

## 2014-05-22 NOTE — Patient Instructions (Addendum)
Your physician wants you to follow-up in:  6 months.  You will receive a reminder letter in the mail two months in advance. If you don't receive a letter, please call our office to schedule the follow-up appointment.  You have been given information for assistance program for Eliquis.  Contact them to see if you qualify. If so return the paperwork to our office and we will complete the physician's part and send in.  You have also been given information regarding the Medicare Low income subsidy.

## 2014-05-22 NOTE — Progress Notes (Signed)
History of Present Illness: 74 yo female with history of DM, HTN, hyperlipidemia, atrial fibrillation, obesity and COPD here today for cardiac follow up. She was seen in my office back in 2012 after [facility] with CP. She ruled out for an MI with serial cardiac enzymes. Her echo showed normal LV size with moderate LVH and normal LV systolic function. Stress myoview without ischemia. CT angiogram without evidence of PE. Prior cardiac cath 2001 with no evidence of CAD. [facility] with a TIA and was started on Eliquis. Of note, her coumadin had been stopped in 2015 due to GI bleeding. She was seen in our office most recently in November 2015 and doing well on Eliquis with rate control with Diltiazem and metoprolol. Echo August 2015 with normal LV size and function.   She is doing well today. No chest pain or SOB. No awareness of palpitations.   Primary Care Physician: Genella Mechlizabeth Kollar  Last Lipid Profile:Lipid Panel     Component Value Date/Time   CHOL 151 05/11/2014 1515   TRIG 135.0 05/11/2014 1515   HDL 32.20* 05/11/2014 1515   CHOLHDL 5 05/11/2014 1515   VLDL 27.0 05/11/2014 1515   LDLCALC 92 05/11/2014 1515     Past Medical History  Diagnosis Date  . Atrial fibrillation   . Hypertension   . Arthritis   . Coagulopathy   . Diabetes mellitus     Type 2  . GI bleed   . Pneumonia     2-3 years ago  . GERD (gastroesophageal reflux disease)   . Anemia   . Hx of transfusion of packed red blood cells   . Diverticulosis   . Anticoagulant drug declined     due to hx GI bleeding    Past Surgical History  Procedure Laterality Date  . Cesarean section    . Replacement total knee bilateral    . Tubal ligation    . Esophagogastroduodenoscopy Left 01/23/2013    Procedure: ESOPHAGOGASTRODUODENOSCOPY (EGD);  Surgeon: Willis ModenaWilliam Outlaw, MD;  [facility];  Service: Endoscopy;  Laterality: Left;  . Colonoscopy Left  01/24/2013    Procedure: COLONOSCOPY;  Surgeon: Willis ModenaWilliam Outlaw, MD;  [facility];  Service: Endoscopy;  Laterality: Left;  [name]. Eye surgery Bilateral     cataract surgery  . Joint replacement      Current Outpatient Prescriptions  Medication Sig Dispense Refill  . apixaban (ELIQUIS) 5 MG TABS tablet Take 1 tablet (5 mg total) by mouth 2 (two) times daily. 180 tablet 3  . diclofenac sodium (VOLTAREN) 1 % GEL Apply 4 g topically 4 (four) times daily as needed (for pain).    [name]. diltiazem (CARDIZEM CD) 180 MG 24 hr capsule Take 1 capsule (180 mg total) by mouth daily. 90 capsule 3  . docusate sodium (COLACE) 100 MG capsule Take 1 capsule (100 mg total) by mouth daily. 30 capsule 3  . gabapentin (NEURONTIN) 100 MG capsule Take 100 mg by mouth at bedtime.    [name]. glimepiride (AMARYL) 2 MG tablet Take 1 tablet (2 mg total) by mouth daily before breakfast. 30 tablet 3  . hydrochlorothiazide (MICROZIDE) 12.5 MG capsule Take 12.5 mg by mouth daily.    [name]. lisinopril (PRINIVIL,ZESTRIL) 20 MG tablet Take 1 tablet (20 mg total) by mouth daily.    [name]. lovastatin (MEVACOR) 20 MG tablet TAKE 1 TABLET BY MOUTH EVERY DAY AT BEDTIME 30 tablet 5  . metoprolol tartrate (  LOPRESSOR) 25 MG tablet Take 0.5 tablets (12.5 mg total) by mouth 2 (two) times daily. 90 each 3  . oxybutynin (DITROPAN-XL) 5 MG 24 hr tablet Take 1 tablet (5 mg total) by mouth at bedtime. 30 tablet 3  . traMADol (ULTRAM) 50 MG tablet Take 2 tablets (100 mg total) by mouth every 6 (six) hours as needed for moderate pain or severe pain. 120 tablet 0   No current facility-administered medications for this visit.    Allergies  Allergen Reactions  . Aspirin Hives    Patient takes this at home  . Hydrocodone Hives    Tolerates oxycodone and tramadol  . Rofecoxib Hives    History   Social History  . Marital Status: Widowed    Spouse Name: [name]  . Number of Children: 4  . Years of Education: 12   Occupational History  . Not on file.     Social History Main Topics  . Smoking status: Former Smoker    Types: Cigarettes    Quit date: 07/07/1993  . Smokeless tobacco: Never Used  . Alcohol Use: No     Comment: no longer drinks alcohol  . Drug Use: No  . Sexual Activity: Not on file   Other Topics Concern  . Not on file   Social History Narrative   Patient is widowed and has 4 living children, and 2 deceased children.   Patient is right handed.   Patient has hs education.   Patient drinks coffee and soda once a week.    Family History  Problem Relation Age of Onset  . Cancer Father     Review of Systems:  As stated in the HPI and otherwise negative.   BP 118/62 mmHg  Pulse 85  Ht  (1.702 m)  Wt 232 lb (105.235 kg)  BMI 36.33 kg/m2  SpO2 98%  Physical Examination: General: Well developed, well nourished, NAD HEENT: OP clear, mucus membranes moist SKIN: warm, dry. No rashes. Neuro: No focal deficits Musculoskeletal: Muscle strength 5/5 all ext Psychiatric: Mood and affect normal Neck: No JVD, no carotid bruits, no thyromegaly, no lymphadenopathy. Lungs:Clear bilaterally, no wheezes, rhonci, crackles Cardiovascular: Regular rate and rhythm. No murmurs, gallops or rubs. Abdomen:Soft. Bowel sounds present. Non-tender.  Extremities: No lower extremity edema. Pulses are 2 + in the bilateral DP/PT.  Echo Study Conclusions from August 2015  - Left ventricle: The cavity size was normal. Wall thickness was increased in a pattern of mild LVH. Systolic function was normal. The estimated ejection fraction was in the range of 60% to 65%. Wall motion was normal; there were no regional wall motion abnormalities. Indeterminant diastolic function (atrial fibrillation). - Aortic valve: There was no stenosis. - Mitral valve: There was trivial regurgitation. - Left atrium: The atrium was severely dilated. - Right ventricle: The cavity size was normal. Systolic function was normal. - Right atrium:  The atrium was severely dilated. - Tricuspid valve: Peak RV-RA gradient (S): 25 mm Hg. - Pulmonary arteries: PA peak pressure: 33 mm Hg (S). - Systemic veins: IVC measured 2.0 cm with < 50% respirophasic variation, suggesting RA pressure 8 mmHg.  Impressions:  - The patient was in atrial fibrillation. Normal LV size with mild LV hypertrophy. EF 60-65%. Normal RV size and systolic function. Severe biatrial enlargement.  Assessment and Plan:   1. Atrial fibrillation, persistent:  Rate controlled today. She is asymptomatic. Will continue rate control strategy with anti-coagulation. Will continue Eliquis  po BID. Will continue Lopressor and  Cardizem for rate control.

## 2014-05-31 ENCOUNTER — Telehealth: Payer: Self-pay | Admitting: *Deleted

## 2014-05-31 NOTE — Telephone Encounter (Signed)
Generally fluid pills are not the first line treatment for swelling. We would need to see her to make sure there is not a serious problem. She can try elevating her legs or using tight fitting socks or hose to keep the fluid out.

## 2014-05-31 NOTE — Telephone Encounter (Signed)
Left msg on triage stating she id having some swelling in her ankles & feet. Requesting md to rx fluid pill...Robin Snow/lmb

## 2014-06-01 NOTE — Telephone Encounter (Signed)
Called pt gave her md response. She stated that she is currently using the stocking, but will try elevating leg, if no better will call back to make appt..[name]

## 2014-06-19 ENCOUNTER — Telehealth: Payer: Self-pay

## 2014-06-19 NOTE — Telephone Encounter (Signed)
Daughter stated the patient did not have a flu shot this season. Instructed to call the office if she would like to receive one by 07/05/2014.

## 2014-07-20 ENCOUNTER — Encounter: Payer: Self-pay | Admitting: Internal Medicine

## 2014-07-20 ENCOUNTER — Ambulatory Visit (INDEPENDENT_AMBULATORY_CARE_PROVIDER_SITE_OTHER): Payer: Medicare Other | Admitting: Internal Medicine

## 2014-07-20 ENCOUNTER — Other Ambulatory Visit (INDEPENDENT_AMBULATORY_CARE_PROVIDER_SITE_OTHER): Payer: Medicare Other

## 2014-07-20 ENCOUNTER — Telehealth: Payer: Self-pay | Admitting: Cardiovascular Disease

## 2014-07-20 VITALS — BP 126/86 | HR 87 | Temp 100.0°F

## 2014-07-20 DIAGNOSIS — M7989 Other specified soft tissue disorders: Secondary | ICD-10-CM

## 2014-07-20 DIAGNOSIS — E785 Hyperlipidemia, unspecified: Secondary | ICD-10-CM | POA: Diagnosis not present

## 2014-07-20 DIAGNOSIS — I872 Venous insufficiency (chronic) (peripheral): Secondary | ICD-10-CM | POA: Diagnosis not present

## 2014-07-20 LAB — HEPATIC FUNCTION PANEL
ALT: 6 U/L (ref 0–35)
AST: 11 U/L (ref 0–37)
Albumin: 3.5 g/dL (ref 3.5–5.2)
Alkaline Phosphatase: 95 U/L (ref 39–117)
BILIRUBIN TOTAL: 1.2 mg/dL (ref 0.2–1.2)
Bilirubin, Direct: 0.3 mg/dL (ref 0.0–0.3)
Total Protein: 7.6 g/dL (ref 6.0–8.3)

## 2014-07-20 LAB — BRAIN NATRIURETIC PEPTIDE: PRO B NATRI PEPTIDE: 162 pg/mL — AB (ref 0.0–100.0)

## 2014-07-20 MED ORDER — HYDROCHLOROTHIAZIDE 12.5 MG PO CAPS: 12.5000 mg | ORAL_CAPSULE | Freq: Every day | ORAL | Status: DC

## 2014-07-20 NOTE — Progress Notes (Signed)
Pre visit review using our clinic review tool, if applicable. No additional management support is needed unless otherwise documented below in the visit note. 

## 2014-07-20 NOTE — Telephone Encounter (Signed)
Left [facility] at [facility] heart failure,a message to call back if needed. Robin Snow wants to know the latest EF and HR. In 11/30/13 EF 60 TO 65%. 05/22/14 hr IS 85 beats/minute.

## 2014-07-20 NOTE — Telephone Encounter (Signed)
New message      Pt is enroll in the heart failure program.  They need most recent ejection fraction, bp and HR

## 2014-07-20 NOTE — Patient Instructions (Signed)
Keep using the compression stockings when you are able, they will help to keep the fluid out of your legs. Also work on elevating the legs whenever you are sitting to help work against gravity.   What can happen is that gravity pulls the fluid down and the veins in the legs do not work quite as well at making the fluid go back up and it gets trapped in the legs.   We will check some lab work today to make sure that there is nothing else causing the fluid. We have sent in the refill of the hydrochlorothiazide that is for fluid. Take 1 pill a day.  Venous Stasis or Chronic Venous Insufficiency Chronic venous insufficiency, also called venous stasis, is a condition that affects the veins in the legs. The condition prevents blood from being pumped through these veins effectively. Blood may no longer be pumped effectively from the legs back to the heart. This condition can range from mild to severe. With proper treatment, you should be able to continue with an active life. CAUSES  Chronic venous insufficiency occurs when the vein walls become stretched, weakened, or damaged or when valves within the vein are damaged. Some common causes of this include:  High blood pressure inside the veins (venous hypertension).  Increased blood pressure in the leg veins from long periods of sitting or standing.  A blood clot that blocks blood flow in a vein (deep vein thrombosis).  Inflammation of a superficial vein (phlebitis) that causes a blood clot to form. RISK FACTORS Various things can make you more likely to develop chronic venous insufficiency, including:  Family history of this condition.  Obesity.  Pregnancy.  Sedentary lifestyle.  Smoking.  Jobs requiring long periods of standing or sitting in one place.  Being a certain age. Women in their 65s and 34s and men in their 60s are more likely to develop this condition. SIGNS AND SYMPTOMS  Symptoms may include:   Varicose veins.  Skin  breakdown or ulcers.  Reddened or discolored skin on the leg.  Brown, smooth, tight, and painful skin just above the ankle, usually on the inside surface (lipodermatosclerosis).  Swelling. DIAGNOSIS  To diagnose this condition, your health care provider will take a medical history and do a physical exam. The following tests may be ordered to confirm the diagnosis:  Duplex ultrasound--A procedure that produces a picture of a blood vessel and nearby organs and also provides information on blood flow through the blood vessel.  Plethysmography--A procedure that tests blood flow.  A venogram, or venography--A procedure used to look at the veins using X-ray and dye. TREATMENT The goals of treatment are to help you return to an active life and to minimize pain or disability. Treatment will depend on the severity of the condition. Medical procedures may be needed for severe cases. Treatment options may include:   Use of compression stockings. These can help with symptoms and lower the chances of the problem getting worse, but they do not cure the problem.  Sclerotherapy--A procedure involving an injection of a material that "dissolves" the damaged veins. Other veins in the network of blood vessels take over the function of the damaged veins.  Surgery to remove the vein or cut off blood flow through the vein (vein stripping or laser ablation surgery).  Surgery to repair a valve. HOME CARE INSTRUCTIONS   Wear compression stockings as directed by your health care provider.  Only take over-the-counter or prescription medicines for pain, discomfort, or  fever as directed by your health care provider.  Follow up with your health care provider as directed. SEEK MEDICAL CARE IF:   You have redness, swelling, or increasing pain in the affected area.  You see a red streak or line that extends up or down from the affected area.  You have a breakdown or loss of skin in the affected area, even if the  breakdown is small.  You have an injury to the affected area. SEEK IMMEDIATE MEDICAL CARE IF:   You have an injury and open wound in the affected area.  Your pain is severe and does not improve with medicine.  You have sudden numbness or weakness in the foot or ankle below the affected area, or you have trouble moving your foot or ankle.  You have a fever or persistent symptoms for more than 2-3 days.  You have a fever and your symptoms suddenly get worse. MAKE SURE YOU:   Understand these instructions.  Will watch your condition.  Will get help right away if you are not doing well or get worse. Document Released: 07/27/2006 Document Revised: 01/11/2013 Document Reviewed: 11/28/2012 [facility] Patient Information 2015 [employer] [place]. This information is not intended to replace advice given to you by your health care provider. Make sure you discuss any questions you have with your health care provider.

## 2014-07-22 NOTE — Assessment & Plan Note (Signed)
This is the most likely cause. Recheck kidney and liver function as well as BNP to rule out CHF. She does have equal swelling without pain or color change. No need for doppler of the legs as swelling is not much changed (and may be slightly improved from last visit in Feb). Will refill her HCTZ which may cause some benefit. Encouraged them to continue with the compression stocking and elevation of her legs when sitting. Talked to them about the etiology of venous insufficiency and that it is not curable but manageable.

## 2014-07-22 NOTE — Progress Notes (Signed)
   Subjective:    Patient ID: Robin Snow, female    DOB: 15-Nov-1940, 74 y.o.   MRN: [id number]  HPI The patient is a 74 YO female who is coming in today for ankle and foot swelling. She has been having it for the last 1-2 weeks. She has tried some compression stockings but is only able to tolerate them some of the day. They do help some. She also has tried elevating her legs which helps some. She is not currently taking hctz which is on her list but she is out of. She is not sure how long she has been out. Denies that her legs are hurting or that one swells more than the other. No fevers or chills. No rash or color change.   Review of Systems  Constitutional: Negative for fever, activity change, appetite change, fatigue and unexpected weight change.  Respiratory: Negative for cough, chest tightness, shortness of breath and wheezing.   Cardiovascular: Positive for leg swelling. Negative for chest pain and palpitations.  Gastrointestinal: Negative.   Musculoskeletal: Positive for myalgias, arthralgias and gait problem.  Skin: Negative.   Neurological: Negative for dizziness, weakness and headaches.      Objective:   Physical Exam  Constitutional: She is oriented to person, place, and time. She appears well-developed and well-nourished.  Obese  HENT:  Head: Normocephalic and atraumatic.  Eyes: EOM are normal.  Neck: Normal range of motion.  Cardiovascular: Normal rate.   Irregularly irregular  Pulmonary/Chest: Effort normal and breath sounds normal. No respiratory distress. She has no wheezes. She has no rales.  Abdominal: Soft. Bowel sounds are normal. She exhibits no distension. There is no tenderness.  Musculoskeletal: She exhibits edema.  Bilateral 2+ edema to midshin  Neurological: She is alert and oriented to person, place, and time. Coordination normal.  Skin: Skin is warm and dry. No rash noted.  NO calf tenderness   Filed Vitals:   07/20/14 1633  BP: 126/86  Pulse: 87   Temp: 100 F (37.8 C)  SpO2: 98%      Assessment & Plan:

## 2014-08-17 ENCOUNTER — Telehealth: Payer: Self-pay | Admitting: *Deleted

## 2014-08-17 NOTE — Telephone Encounter (Signed)
Left message to call back  

## 2014-08-17 NOTE — Telephone Encounter (Signed)
Received call [facility] (cell phone-[phone]).  He reports pt was to contact our office to ask about stopping Eliquis prior to procedure today but did not contact us.  Procedure has been cancelled for today as pt did not stop Eliquis.  Pt is being scheduled for elective procedure--spinal cord stimulator.  Dr. Myna HidalgoAjam is requesting clearance for this procedure and if OK for pt to stop Eliquis 72 hours prior to procedure and for 7 days after procedure.  He can be reached on cell number listed.

## 2014-08-17 NOTE — Telephone Encounter (Signed)
OK to hold Eliquis for the procedure. I saw her recently so no visit is needed for clearance. Robin Snow

## 2014-08-23 NOTE — Telephone Encounter (Signed)
Dr. Myna HidalgoAjam called back and I was on phone.  He left same number below  as call back and asked I leave message on his voicemail if he is unavailable. I placed call and left message on his voicemail with information from Dr. Clifton JamesMcAlhany. Left message to call back if questions.

## 2014-09-07 ENCOUNTER — Ambulatory Visit (INDEPENDENT_AMBULATORY_CARE_PROVIDER_SITE_OTHER): Payer: Medicare Other | Admitting: Neurology

## 2014-09-07 ENCOUNTER — Encounter: Payer: Self-pay | Admitting: Neurology

## 2014-09-07 VITALS — BP 125/73 | HR 76 | Wt 239.0 lb

## 2014-09-07 DIAGNOSIS — I482 Chronic atrial fibrillation, unspecified: Secondary | ICD-10-CM

## 2014-09-07 DIAGNOSIS — E785 Hyperlipidemia, unspecified: Secondary | ICD-10-CM

## 2014-09-07 DIAGNOSIS — I10 Essential (primary) hypertension: Secondary | ICD-10-CM

## 2014-09-07 DIAGNOSIS — I6340 Cerebral infarction due to embolism of unspecified cerebral artery: Secondary | ICD-10-CM

## 2014-09-07 DIAGNOSIS — I639 Cerebral infarction, unspecified: Secondary | ICD-10-CM

## 2014-09-07 NOTE — Patient Instructions (Addendum)
-   continue eliquis and lovastatin for stroke prevention - check BP at home and glucose  - follow up with Dr. Myna HidalgoAjam for right knee pain with swelling - Follow up with your primary care physician for stroke risk factor modification. Recommend maintain blood pressure goal <130/80, diabetes with hemoglobin A1c goal below 6.5% and lipids with LDL cholesterol goal below 70 mg/dL.  - home exercise as much as possible - follow up in 6 months

## 2014-09-07 NOTE — Progress Notes (Signed)
STROKE NEUROLOGY FOLLOW UP NOTE  NAME: Robin Snow DOB: 11/16/1940  REASON FOR VISIT: stroke follow up HISTORY FROM: pt and chart  Today we had the pleasure of seeing Robin Snow in follow-up at our Neurology [facility]. Pt was accompanied by daughter.   History Summary Robin Snow is a 74 y.o. female with history of HTN, DM, afib not on anticoagulation due to history of GIB from diverticular source. She had acute onset right arm weakness on 11/29/2013 on waking up. On EMS arrival to the scene, she has had significant improvement in her right arm. Patient was not administered TPA secondary to delay in arrival. She was admitted for further evaluation and treatment.MRI showed punctate acute infarction at the right inferior pons. Normal MRA. Carotid Dopplers unremarkable. LDL was 62 and Hgb a1c 5.7 on Glipizide. She has history of diverticular bleed x 2 while on warfarin which has been discontinued. She had an colonoscopy but no culprit lesion was found. She was started on Eliquis for stroke prevention.   01/10/14 follow up - Blood pressure is well controlled, it is 110/78 in the office today. She feels she is back to her baseline from stroke, is only having problems with chronic right knee pain. She is getting Home PT for this. She has no new complaints today.   05/14/14 follow up - the patient has been doing well.  No recurrent stroke symptoms. She is on Eliquis now, however she had financial difficulties with continue Eliquis. Showed patient and her daughter the waist to get coupon from [employer]. Patient also waiting for some financial program support. She stated that her blood pressure and glucose at home was controlled. Her blood pressure 121/74 today.  She came in on wheelchair today because bilaterally knee pain. She denies any weakness of lower extremities.  Interval History During the interval time, pt has been doing well from stroke standpoint. However, she still has  bilateral knee pain which limited her activity and exercise. She is basically wheelchair bound. Today in clinic she had right knee pain, swollen, warm, and redness. She stated that this happened intermittently along with right-sided knee pain. She has an appointment with pain management Dr. Myna Hidalgo in 1 week who is treating her for knee pain with knee joint aspirations. She is still taking eliquis although she continued to have financial difficulties. Her blood pressure today in clinic 125/73.  REVIEW OF SYSTEMS: Full 14 system review of systems performed and notable only for those listed below and in HPI above, all others are negative:  Constitutional:   Cardiovascular:  Ear/Nose/Throat:   Skin:  Eyes:   Respiratory:   Gastroitestinal:   Genitourinary:  Hematology/Lymphatic:   Endocrine:  Musculoskeletal:   Allergy/Immunology:   Neurological:   Psychiatric:  Sleep:   The following represents the patient's updated allergies and side effects list: Allergies  Allergen Reactions  . Aspirin Hives  . Hydrocodone Hives    Tolerates oxycodone and tramadol  . Rofecoxib Hives    The neurologically relevant items on the patient's problem list were reviewed on today's visit.  Neurologic Examination  A problem focused neurological exam (12 or more points of the single system neurologic examination, vital signs counts as 1 point, cranial nerves count for 8 points) was performed.  Blood pressure 125/73, pulse 76, weight 239 lb (108.41 kg).  General - Well nourished, well developed, in no apparent distress.  Ophthalmologic - fundi not visualized due to incorporation.  Cardiovascular - irregularly irregular heart  rate and rhythm.  Extremities - right knee swelling, warm and tenderness on touch, redness.  Mental Status -  Level of arousal and orientation to time, place, and person were intact. Language including expression, naming, repetition, comprehension was assessed and found  intact.  Cranial Nerves II - XII - II - Visual field intact OU. III, IV, VI - Extraocular movements intact. V - Facial sensation intact bilaterally. VII - Facial movement intact bilaterally. VIII - Hearing & vestibular intact bilaterally. X - Palate elevates symmetrically. XI - Chin turning & shoulder shrug intact bilaterally. XII - Tongue protrusion intact.  Motor Strength - The patient's strength was normal in upper extremities and pronator drift was absent. LLE 5-/5 and RLE 3-/5 proximal and 5/5 distal, however difficulty with knee extension and flexion due to pain. Bulk was normal and fasciculations were absent.   Motor Tone - Muscle tone was assessed at the neck and appendages and was normal.  Reflexes - The patient's reflexes were 1+ in all extremities and she had no pathological reflexes.  Sensory - Light touch, temperature/pinprick were assessed and were normal.    Coordination - The patient had normal movements in the hands with no ataxia or dysmetria.  Tremor was absent.  Gait and Station - in wheelchair, not tested due to bilateral knee pain.  Data reviewed: I personally reviewed the images and agree with the radiology interpretations.  Ct Head Wo Contrast  11/29/2013 IMPRESSION: No acute intracranial abnormalities. Chronic atrophy and small vessel ischemic changes.  Mri and Mra Brain Without Contrast  11/29/2013 IMPRESSION: Punctate acute infarction at the right inferior pons. Moderate chronic small-vessel changes affecting the cerebral hemispheric white matter. Normal MR angiography of the large and medium size vessels.    CUS - Bilateral: 1-39% ICA stenosis. Vertebral artery flow is antegrade.  2D echo - - The patient was in atrial fibrillation. Normal LV size with mild LV hypertrophy. EF 60-65%. Normal RV size and systolic function. Severe biatrial enlargement.  Component     Latest Ref Rng 11/13/2013 11/29/2013 11/30/2013 05/11/2014  Cholesterol     0 -  200 mg/dL   409 811  Triglycerides     0.0 - 149.0 mg/dL   68 914.7  HDL     >82.95 mg/dL   33 (L) 62.13 (L)  VLDL     0.0 - 40.0 mg/dL   14 08.6  LDL (calc)     0 - 99 mg/dL   62 92  Total CHOL/HDL Ratio        3.3 5  NonHDL         118.80  Hemoglobin A1C     4.6 - 6.5 %  5.7 (H)  5.7  Mean Plasma Glucose     <117 mg/dL  578 (H)    TSH     4.696 - 4.500 uIU/mL 0.938       Assessment: As you may recall, she is a 74 y.o. African American female with PMH of HTN, DM, afib off coumadin due to history of GIB from diverticular source (however colonoscopy negative) admitted on 11/29/2013 for right arm weakness.MRI showed punctate acute infarction at the right inferior pons. Normal MRA. LDL 62 and Hgb a1c 5.7. She was started on Eliquis for stroke prevention. During the interval time, she continued on eliquis but with difficulty with financial support. Her recent LDL 92.  Plan:  - continue eliquis and lovastatin for stroke prevention - check BP and glucose at home - Follow up  with your primary care physician for stroke risk factor modification. Recommend maintain blood pressure goal <130/80, diabetes with hemoglobin A1c goal below 6.5% and lipids with LDL cholesterol goal below 70 mg/dL.  - follow up with Dr. Myna Hidalgo for right knee pain with swelling - RTC in 6 months.  No orders of the defined types were placed in this encounter.    Meds ordered this encounter  Medications  . potassium chloride (KLOR-CON) 20 MEQ packet    Sig: Take 20 mEq by mouth daily.  [name] HYDROmorphone (DILAUDID) 2 MG tablet    Sig: Take 2 mg by mouth.  . gabapentin (NEURONTIN) 300 MG capsule    Sig: TAKE 1 CAPSULE (300 MG TOTAL) BY MOUTH 3 TIMES DAILY.    Patient Instructions  - continue eliquis and lovastatin for stroke prevention - check BP at home and glucose  - follow up with Dr. Myna Hidalgo for right knee pain with swelling - Follow up with your primary care physician for stroke risk factor modification.  Recommend maintain blood pressure goal <130/80, diabetes with hemoglobin A1c goal below 6.5% and lipids with LDL cholesterol goal below 70 mg/dL.  - home exercise as much as possible - follow up in 6 months    Marvel Plan, MD PhD [facility] [address] [place], [place] [address] [phone]

## 2014-09-14 DIAGNOSIS — M792 Neuralgia and neuritis, unspecified: Secondary | ICD-10-CM | POA: Diagnosis not present

## 2014-09-14 DIAGNOSIS — G894 Chronic pain syndrome: Secondary | ICD-10-CM | POA: Diagnosis not present

## 2014-10-02 ENCOUNTER — Other Ambulatory Visit: Payer: Self-pay | Admitting: Geriatric Medicine

## 2014-10-02 ENCOUNTER — Other Ambulatory Visit: Payer: Self-pay | Admitting: Internal Medicine

## 2014-10-02 MED ORDER — HYDROCHLOROTHIAZIDE 12.5 MG PO CAPS: 12.5000 mg | ORAL_CAPSULE | Freq: Every day | ORAL | Status: DC

## 2014-10-02 MED ORDER — LOVASTATIN 20 MG PO TABS: 20.0000 mg | ORAL_TABLET | Freq: Every day | ORAL | Status: DC

## 2014-10-02 MED ORDER — METOPROLOL TARTRATE 25 MG PO TABS: 12.5000 mg | ORAL_TABLET | Freq: Two times a day (BID) | ORAL | Status: DC

## 2014-10-02 MED ORDER — GLIMEPIRIDE 2 MG PO TABS: ORAL_TABLET | ORAL | Status: DC

## 2014-10-02 MED ORDER — GABAPENTIN 100 MG PO CAPS: 100.0000 mg | ORAL_CAPSULE | Freq: Every day | ORAL | Status: DC

## 2014-10-02 MED ORDER — OXYBUTYNIN CHLORIDE 5 MG PO TABS: 5.0000 mg | ORAL_TABLET | Freq: Every day | ORAL | Status: DC

## 2014-10-02 MED ORDER — DILTIAZEM HCL ER COATED BEADS 180 MG PO CP24: 180.0000 mg | ORAL_CAPSULE | Freq: Every day | ORAL | Status: DC

## 2014-10-04 ENCOUNTER — Other Ambulatory Visit: Payer: Self-pay | Admitting: Geriatric Medicine

## 2014-10-04 MED ORDER — GABAPENTIN 100 MG PO CAPS: 100.0000 mg | ORAL_CAPSULE | Freq: Every day | ORAL | Status: DC

## 2014-10-04 MED ORDER — GABAPENTIN 300 MG PO CAPS: 300.0000 mg | ORAL_CAPSULE | Freq: Three times a day (TID) | ORAL | Status: DC

## 2014-10-04 MED ORDER — LOVASTATIN 20 MG PO TABS: 20.0000 mg | ORAL_TABLET | Freq: Every day | ORAL | Status: DC

## 2014-10-04 MED ORDER — DILTIAZEM HCL ER COATED BEADS 180 MG PO CP24: 180.0000 mg | ORAL_CAPSULE | Freq: Every day | ORAL | Status: DC

## 2014-10-04 MED ORDER — HYDROCHLOROTHIAZIDE 12.5 MG PO CAPS: 12.5000 mg | ORAL_CAPSULE | Freq: Every day | ORAL | Status: DC

## 2014-10-10 ENCOUNTER — Other Ambulatory Visit: Payer: Self-pay | Admitting: Geriatric Medicine

## 2014-10-16 ENCOUNTER — Other Ambulatory Visit: Payer: Self-pay

## 2014-10-16 DIAGNOSIS — E1140 Type 2 diabetes mellitus with diabetic neuropathy, unspecified: Secondary | ICD-10-CM

## 2014-10-16 MED ORDER — APIXABAN 5 MG PO TABS: 5.0000 mg | ORAL_TABLET | Freq: Two times a day (BID) | ORAL | Status: DC

## 2014-10-16 MED ORDER — GLUCOSE BLOOD VI STRP: ORAL_STRIP | Status: DC

## 2014-10-16 MED ORDER — ACCU-CHEK NANO SMARTVIEW W/DEVICE KIT
PACK | Status: DC
Start: 2014-10-16 — End: 2014-12-20

## 2014-10-31 DIAGNOSIS — Z01818 Encounter for other preprocedural examination: Secondary | ICD-10-CM | POA: Diagnosis not present

## 2014-11-05 DIAGNOSIS — I10 Essential (primary) hypertension: Secondary | ICD-10-CM | POA: Diagnosis not present

## 2014-11-05 DIAGNOSIS — G894 Chronic pain syndrome: Secondary | ICD-10-CM | POA: Diagnosis not present

## 2014-11-05 DIAGNOSIS — G8929 Other chronic pain: Secondary | ICD-10-CM | POA: Diagnosis not present

## 2014-11-13 DIAGNOSIS — M25561 Pain in right knee: Secondary | ICD-10-CM | POA: Diagnosis not present

## 2014-11-13 DIAGNOSIS — G894 Chronic pain syndrome: Secondary | ICD-10-CM | POA: Diagnosis not present

## 2014-11-13 DIAGNOSIS — Z9889 Other specified postprocedural states: Secondary | ICD-10-CM | POA: Diagnosis not present

## 2014-11-24 ENCOUNTER — Encounter (HOSPITAL_COMMUNITY): Payer: Self-pay | Admitting: Emergency Medicine

## 2014-11-24 ENCOUNTER — Emergency Department (HOSPITAL_COMMUNITY)
Admission: EM | Admit: 2014-11-24 | Discharge: 2014-11-24 | Disposition: A | Discharge status: Home / Self Care | Payer: Commercial Managed Care - HMO | Attending: Emergency Medicine | Admitting: Emergency Medicine

## 2014-11-24 DIAGNOSIS — E119 Type 2 diabetes mellitus without complications: Secondary | ICD-10-CM | POA: Insufficient documentation

## 2014-11-24 DIAGNOSIS — S138XXA Sprain of joints and ligaments of other parts of neck, initial encounter: Secondary | ICD-10-CM | POA: Diagnosis not present

## 2014-11-24 DIAGNOSIS — Z8719 Personal history of other diseases of the digestive system: Secondary | ICD-10-CM | POA: Diagnosis not present

## 2014-11-24 DIAGNOSIS — Z79899 Other long term (current) drug therapy: Secondary | ICD-10-CM | POA: Diagnosis not present

## 2014-11-24 DIAGNOSIS — Z7902 Long term (current) use of antithrombotics/antiplatelets: Secondary | ICD-10-CM | POA: Insufficient documentation

## 2014-11-24 DIAGNOSIS — I4891 Unspecified atrial fibrillation: Secondary | ICD-10-CM | POA: Diagnosis not present

## 2014-11-24 DIAGNOSIS — Z8701 Personal history of pneumonia (recurrent): Secondary | ICD-10-CM | POA: Insufficient documentation

## 2014-11-24 DIAGNOSIS — Z862 Personal history of diseases of the blood and blood-forming organs and certain disorders involving the immune mechanism: Secondary | ICD-10-CM | POA: Diagnosis not present

## 2014-11-24 DIAGNOSIS — M542 Cervicalgia: Secondary | ICD-10-CM | POA: Diagnosis present

## 2014-11-24 DIAGNOSIS — Z87891 Personal history of nicotine dependence: Secondary | ICD-10-CM | POA: Diagnosis not present

## 2014-11-24 DIAGNOSIS — M62838 Other muscle spasm: Secondary | ICD-10-CM | POA: Diagnosis not present

## 2014-11-24 DIAGNOSIS — I10 Essential (primary) hypertension: Secondary | ICD-10-CM | POA: Insufficient documentation

## 2014-11-24 DIAGNOSIS — M1990 Unspecified osteoarthritis, unspecified site: Secondary | ICD-10-CM | POA: Diagnosis not present

## 2014-11-24 MED ORDER — OXYCODONE-ACETAMINOPHEN 5-325 MG PO TABS: 1.0000 | ORAL_TABLET | Freq: Three times a day (TID) | ORAL | Status: DC | PRN

## 2014-11-24 MED ORDER — OXYCODONE-ACETAMINOPHEN 5-325 MG PO TABS
1.0000 | ORAL_TABLET | Freq: Once | ORAL | Status: AC
  Administered 2014-11-24: 1 via ORAL
  Filled 2014-11-24: qty 1

## 2014-11-24 MED ORDER — METHOCARBAMOL 500 MG PO TABS: 500.0000 mg | ORAL_TABLET | Freq: Two times a day (BID) | ORAL | Status: DC

## 2014-11-24 MED ORDER — DIAZEPAM 2 MG PO TABS
2.0000 mg | ORAL_TABLET | Freq: Once | ORAL | Status: AC
  Administered 2014-11-24: 2 mg via ORAL
  Filled 2014-11-24: qty 1
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
## 2014-11-24 NOTE — Discharge Instructions (Signed)
We think that your pain is from cervical spasm. Please use heat therapy for the neck, and take the meds as prescribed.  See your doctor in 3 days.  If you have dizziness, severe headaches, vision complains, return to the Er immediately.   Muscle Cramps and Spasms Muscle cramps and spasms occur when a muscle or muscles tighten and you have no control over this tightening (involuntary muscle contraction). They are a common problem and can develop in any muscle. The most common place is in the calf muscles of the leg. Both muscle cramps and muscle spasms are involuntary muscle contractions, but they also have differences:   Muscle cramps are sporadic and painful. They may last a few seconds to a quarter of an hour. Muscle cramps are often more forceful and last longer than muscle spasms.  Muscle spasms may or may not be painful. They may also last just a few seconds or much longer. CAUSES  It is uncommon for cramps or spasms to be due to a serious underlying problem. In many cases, the cause of cramps or spasms is unknown. Some common causes are:   Overexertion.   Overuse from repetitive motions (doing the same thing over and over).   Remaining in a certain position for a long period of time.   Improper preparation, form, or technique while performing a sport or activity.   Dehydration.   Injury.   Side effects of some medicines.   Abnormally low levels of the salts and ions in your blood (electrolytes), especially potassium and calcium. This could happen if you are taking water pills (diuretics) or you are pregnant.  Some underlying medical problems can make it more likely to develop cramps or spasms. These include, but are not limited to:   Diabetes.   Parkinson disease.   Hormone disorders, such as thyroid problems.   Alcohol abuse.   Diseases specific to muscles, joints, and bones.   Blood vessel disease where not enough blood is getting to the muscles.   HOME CARE INSTRUCTIONS   Stay well hydrated. Drink enough water and fluids to keep your urine clear or pale yellow.  It may be helpful to massage, stretch, and relax the affected muscle.  For tight or tense muscles, use a warm towel, heating pad, or hot shower water directed to the affected area.  If you are sore or have pain after a cramp or spasm, applying ice to the affected area may relieve discomfort.  Put ice in a plastic bag.  Place a towel between your skin and the bag.  Leave the ice on for 15-20 minutes, 03-04 times a day.  Medicines used to treat a known cause of cramps or spasms may help reduce their frequency or severity. Only take over-the-counter or prescription medicines as directed by your caregiver. SEEK MEDICAL CARE IF:  Your cramps or spasms get more severe, more frequent, or do not improve over time.  MAKE SURE YOU:   Understand these instructions.  Will watch your condition.  Will get help right away if you are not doing well or get worse. Document Released: 09/12/2001 Document Revised: 07/18/2012 Document Reviewed: 03/09/2012 [facility] Patient Information 2015 [place], [place]. This information is not intended to replace advice given to you by your health care provider. Make sure you discuss any questions you have with your health care provider.

## 2014-11-24 NOTE — ED Notes (Signed)
Pt states neck pain started suddenly yesterday morning. Radiates to L ear. Pt states difficulty turning head L and R, but pain tolerable when still.

## 2014-12-03 ENCOUNTER — Other Ambulatory Visit: Payer: Self-pay | Admitting: Internal Medicine

## 2014-12-05 ENCOUNTER — Telehealth: Payer: Self-pay | Admitting: *Deleted

## 2014-12-05 MED ORDER — APIXABAN 5 MG PO TABS: 5.0000 mg | ORAL_TABLET | Freq: Two times a day (BID) | ORAL | Status: DC

## 2014-12-05 NOTE — Telephone Encounter (Signed)
Receive call mom is needing refills on her elaquis needing rx sent to [facility].../lmb

## 2014-12-14 DIAGNOSIS — R5381 Other malaise: Secondary | ICD-10-CM | POA: Diagnosis not present

## 2014-12-14 DIAGNOSIS — M25561 Pain in right knee: Secondary | ICD-10-CM | POA: Diagnosis not present

## 2014-12-14 DIAGNOSIS — Z9689 Presence of other specified functional implants: Secondary | ICD-10-CM | POA: Insufficient documentation

## 2014-12-14 DIAGNOSIS — Z9889 Other specified postprocedural states: Secondary | ICD-10-CM

## 2014-12-14 DIAGNOSIS — Z96653 Presence of artificial knee joint, bilateral: Secondary | ICD-10-CM | POA: Diagnosis not present

## 2014-12-17 ENCOUNTER — Other Ambulatory Visit: Payer: Self-pay

## 2014-12-19 ENCOUNTER — Ambulatory Visit (INDEPENDENT_AMBULATORY_CARE_PROVIDER_SITE_OTHER): Payer: Commercial Managed Care - HMO | Admitting: Cardiovascular Disease

## 2014-12-19 ENCOUNTER — Encounter: Payer: Self-pay | Admitting: Cardiovascular Disease

## 2014-12-19 VITALS — BP 140/88 | HR 75 | Ht 67.0 in | Wt 232.0 lb

## 2014-12-19 DIAGNOSIS — I482 Chronic atrial fibrillation, unspecified: Secondary | ICD-10-CM

## 2014-12-19 DIAGNOSIS — I10 Essential (primary) hypertension: Secondary | ICD-10-CM | POA: Diagnosis not present

## 2014-12-19 MED ORDER — LISINOPRIL 40 MG PO TABS: 40.0000 mg | ORAL_TABLET | Freq: Every day | ORAL | Status: DC

## 2014-12-19 NOTE — Progress Notes (Signed)
Chief Complaint  Patient presents with  . Follow-up      History of Present Illness: 74 yo female with history of DM, HTN, hyperlipidemia, atrial fibrillation, obesity and COPD here today for cardiac follow up. She was seen in my office back in 2012 after being admitted to [facility] 05/02/10 with CP. She ruled out for an MI with serial cardiac enzymes. Her echo showed normal LV size with moderate LVH and normal LV systolic function. Stress myoview without ischemia. CT angiogram without evidence of PE. Prior cardiac cath 2001 with no evidence of CAD. Admitted to [facility] August 2015 with a TIA and was started on Eliquis. Of note, her coumadin had been stopped in 2015 due to GI bleeding. She was seen in our office November 2015 and doing well on Eliquis with rate control with Diltiazem and metoprolol. Echo August 2015 with normal LV size and function.   She is doing well today. No chest pain or SOB. No awareness of palpitations. Recent procedure for spinal stimulator for back pain. No bleeding on Eliquis.   Primary Care Physician: Vertell Novak  Last Lipid Profile:Lipid Panel     Component Value Date/Time   CHOL 151 05/11/2014 1515   TRIG 135.0 05/11/2014 1515   HDL 32.20* 05/11/2014 1515   CHOLHDL 5 05/11/2014 1515   VLDL 27.0 05/11/2014 1515   [place] 92 05/11/2014 1515     Past Medical History  Diagnosis Date  . Atrial fibrillation   . Hypertension   . Arthritis   . Coagulopathy   . Diabetes mellitus     Type 2  . GI bleed   . Pneumonia     2-3 years ago  . GERD (gastroesophageal reflux disease)   . Anemia   . Hx of transfusion of packed red blood cells   . Diverticulosis   . Anticoagulant drug declined     due to hx GI bleeding    Past Surgical History  Procedure Laterality Date  . Cesarean section    . Replacement total knee bilateral    . Tubal ligation    . Esophagogastroduodenoscopy Left 01/23/2013    Procedure: ESOPHAGOGASTRODUODENOSCOPY (EGD);   Surgeon: Arta Silence, MD;  Location: [facility] ENDOSCOPY;  Service: Endoscopy;  Laterality: Left;  . Colonoscopy Left 01/24/2013    Procedure: COLONOSCOPY;  Surgeon: Arta Silence, MD;  Location: [facility] ENDOSCOPY;  Service: Endoscopy;  Laterality: Left;  [name] Eye surgery Bilateral     cataract surgery  . Joint replacement      Current Outpatient Prescriptions  Medication Sig Dispense Refill  . apixaban (ELIQUIS) 5 MG TABS tablet Take 1 tablet (5 mg total) by mouth 2 (two) times daily. 180 tablet 1  . Blood Glucose Monitoring Suppl (ACCU-CHEK NANO SMARTVIEW) W/DEVICE KIT Use twice daily as instructed. Dx: E119., I10. 1 kit 0  . diltiazem (CARDIZEM CD) 180 MG 24 hr capsule Take 1 capsule (180 mg total) by mouth daily. 90 capsule 3  . docusate sodium (COLACE) 100 MG capsule TAKE ONE CAPSULE BY MOUTH DAILY 30 capsule 0  . gabapentin (NEURONTIN) 100 MG capsule Take 1 capsule (100 mg total) by mouth at bedtime. 90 capsule 3  . gabapentin (NEURONTIN) 300 MG capsule Take 1 capsule (300 mg total) by mouth 3 (three) times daily. TAKE 1 CAPSULE (300 MG TOTAL) BY MOUTH 3 TIMES DAILY. 270 capsule 3  . glimepiride (AMARYL) 2 MG tablet TAKE 1 TABLET (2 MG TOTAL) BY MOUTH DAILY BEFORE BREAKFAST. 90 tablet 3  .  glucose blood (ACCU-CHEK SMARTVIEW) test strip Use twice daily as instructed. Dx: E11.9, I10 100 each 5  . hydrochlorothiazide (MICROZIDE) 12.5 MG capsule Take 1 capsule (12.5 mg total) by mouth daily. 90 capsule 3  . lisinopril (PRINIVIL,ZESTRIL) 40 MG tablet Take 1 tablet (40 mg total) by mouth daily. 30 tablet 11  . lovastatin (MEVACOR) 20 MG tablet Take 1 tablet (20 mg total) by mouth at bedtime. 90 tablet 3  . metoprolol tartrate (LOPRESSOR) 25 MG tablet Take 0.5 tablets (12.5 mg total) by mouth 2 (two) times daily. 90 tablet 3  . oxybutynin (DITROPAN) 5 MG tablet TAKE 1 TABLET BY MOUTH DAILY 90 tablet 1  . potassium chloride (KLOR-CON) 20 MEQ packet Take 20 mEq by mouth daily.     No current  facility-administered medications for this visit.    Allergies  Allergen Reactions  . Aspirin Hives  . Hydrocodone Hives    Tolerates oxycodone and tramadol  . Rofecoxib Hives    Social History   Social History  . Marital Status: Widowed    Spouse Name: [name]  . Number of Children: 4  . Years of Education: 12   Occupational History  . Not on file.   Social History Main Topics  . Smoking status: Former Smoker    Types: Cigarettes    Quit date: 07/07/1993  . Smokeless tobacco: Never Used  . Alcohol Use: No     Comment: no longer drinks alcohol  . Drug Use: No  . Sexual Activity: Not on file   Other Topics Concern  . Not on file   Social History Narrative   Patient is widowed and has 4 living children, and 2 deceased children.   Patient is right handed.   Patient has hs education.   Patient drinks coffee and soda once a week.    Family History  Problem Relation Age of Onset  . Cancer Father     Review of Systems:  As stated in the HPI and otherwise negative.   BP 140/88 mmHg  Pulse 75  Ht 5' 7" (1.702 m)  Wt 232 lb (105.235 kg)  BMI 36.33 kg/m2  SpO2 99%  Physical Examination: General: Well developed, well nourished, NAD HEENT: OP clear, mucus membranes moist SKIN: warm, dry. No rashes. Neuro: No focal deficits Musculoskeletal: Muscle strength 5/5 all ext Psychiatric: Mood and affect normal Neck: No JVD, no carotid bruits, no thyromegaly, no lymphadenopathy. Lungs:Clear bilaterally, no wheezes, rhonci, crackles Cardiovascular: Regular rate and rhythm. No murmurs, gallops or rubs. Abdomen:Soft. Bowel sounds present. Non-tender.  Extremities: No lower extremity edema. Pulses are 2 + in the bilateral DP/PT.  Echo August 2015  - Left ventricle: The cavity size was normal. Wall thickness was increased in a pattern of mild LVH. Systolic function was normal. The estimated ejection fraction was in the range of 60% to 65%. Wall motion was normal;  there were no regional wall motion abnormalities. Indeterminant diastolic function (atrial fibrillation). - Aortic valve: There was no stenosis. - Mitral valve: There was trivial regurgitation. - Left atrium: The atrium was severely dilated. - Right ventricle: The cavity size was normal. Systolic function was normal. - Right atrium: The atrium was severely dilated. - Tricuspid valve: Peak RV-RA gradient (S): 25 mm Hg. - Pulmonary arteries: PA peak pressure: 33 mm Hg (S). - Systemic veins: IVC measured 2.0 cm with < 50% respirophasic variation, suggesting RA pressure 8 mmHg.  Impressions:  - The patient was in atrial fibrillation. Normal LV size with  mild LV hypertrophy. EF 60-65%. Normal RV size and systolic function. Severe biatrial enlargement.  EKG:  EKG is ordered today. The ekg ordered today demonstrates Atrial fib, rate 75 bpm.   Recent Labs: 02/26/2014: BUN 10; Creatinine, Ser 1.0; Hemoglobin 10.5*; Platelets 149.0*; Potassium 3.6; Sodium 139 07/20/2014: ALT 6; Pro B Natriuretic peptide (BNP) 162.0*   Lipid Panel    Component Value Date/Time   CHOL 151 05/11/2014 1515   TRIG 135.0 05/11/2014 1515   HDL 32.20* 05/11/2014 1515   CHOLHDL 5 05/11/2014 1515   VLDL 27.0 05/11/2014 1515   LDLCALC 92 05/11/2014 1515     Wt Readings from Last 3 Encounters:  12/19/14 232 lb (105.235 kg)  09/07/14 239 lb (108.41 kg)  05/22/14 232 lb (105.235 kg)     Other studies Reviewed: Additional studies/ records that were reviewed today include: . Review of the above records demonstrates:   Assessment and Plan:   1. Atrial fibrillation, persistent:  Rate controlled today. She is asymptomatic. Will continue rate control strategy with anti-coagulation. Will continue Eliquis 74m po BID. Will continue Lopressor and Cardizem for rate control.   2. HTN: BP elevated today and at home. Will increase Lisinopril to 40 mg daily and check BMET in one week.   Current medicines are  reviewed at length with the patient today.  The patient does not have concerns regarding medicines.  The following changes have been made:  Lisinopril increased.   Labs/ tests ordered today include:   Orders Placed This Encounter  Procedures  . Basic Metabolic Panel (BMET)  . EKG 12-Lead    Disposition:   FU with me in 12  months  Signed, CLauree Chandler MD 12/19/2014 5:25 PM    [facility] [address] [place]  [address]Phone: [phone] Fax: [phone]

## 2014-12-19 NOTE — Patient Instructions (Signed)
Medication Instructions:  Your physician has recommended you make the following change in your medication: Increase lisinopril to 40 mg by mouth daily   Labwork: Your physician recommends that you return for lab work in: one week--September 22,2016.  The lab opens at 7:30 AM.  You do not need to be fasting.    Testing/Procedures: none  Follow-Up: Your physician wants you to follow-up in: 12 months.  You will receive a reminder letter in the mail two months in advance. If you don't receive a letter, please call our office to schedule the follow-up appointment.   Any Other Special Instructions Will Be Listed Below (If Applicable).

## 2014-12-20 ENCOUNTER — Other Ambulatory Visit: Payer: Self-pay | Admitting: Internal Medicine

## 2014-12-24 DIAGNOSIS — Z96653 Presence of artificial knee joint, bilateral: Secondary | ICD-10-CM | POA: Diagnosis not present

## 2014-12-24 DIAGNOSIS — M25561 Pain in right knee: Secondary | ICD-10-CM | POA: Diagnosis not present

## 2014-12-24 DIAGNOSIS — E119 Type 2 diabetes mellitus without complications: Secondary | ICD-10-CM | POA: Diagnosis not present

## 2014-12-24 DIAGNOSIS — Z969 Presence of functional implant, unspecified: Secondary | ICD-10-CM | POA: Diagnosis not present

## 2014-12-24 DIAGNOSIS — G8929 Other chronic pain: Secondary | ICD-10-CM | POA: Diagnosis not present

## 2014-12-24 DIAGNOSIS — R5381 Other malaise: Secondary | ICD-10-CM | POA: Diagnosis not present

## 2014-12-27 ENCOUNTER — Other Ambulatory Visit (INDEPENDENT_AMBULATORY_CARE_PROVIDER_SITE_OTHER): Payer: Commercial Managed Care - HMO

## 2014-12-27 DIAGNOSIS — I10 Essential (primary) hypertension: Secondary | ICD-10-CM | POA: Diagnosis not present

## 2014-12-27 LAB — BASIC METABOLIC PANEL
BUN: 12 mg/dL (ref 6–23)
CHLORIDE: 106 meq/L (ref 96–112)
CO2: 30 meq/L (ref 19–32)
Calcium: 9.3 mg/dL (ref 8.4–10.5)
Creatinine, Ser: 0.85 mg/dL (ref 0.40–1.20)
GFR: 83.78 mL/min (ref >60.00)
GLUCOSE: 74 mg/dL (ref 70–99)
POTASSIUM: 4 meq/L (ref 3.5–5.1)
SODIUM: 141 meq/L (ref 135–145)

## 2014-12-28 DIAGNOSIS — Z96653 Presence of artificial knee joint, bilateral: Secondary | ICD-10-CM | POA: Diagnosis not present

## 2014-12-28 DIAGNOSIS — Z969 Presence of functional implant, unspecified: Secondary | ICD-10-CM | POA: Diagnosis not present

## 2014-12-28 DIAGNOSIS — M25561 Pain in right knee: Secondary | ICD-10-CM | POA: Diagnosis not present

## 2014-12-28 DIAGNOSIS — G8929 Other chronic pain: Secondary | ICD-10-CM | POA: Diagnosis not present

## 2014-12-28 DIAGNOSIS — R5381 Other malaise: Secondary | ICD-10-CM | POA: Diagnosis not present

## 2014-12-28 DIAGNOSIS — E119 Type 2 diabetes mellitus without complications: Secondary | ICD-10-CM | POA: Diagnosis not present

## 2014-12-31 DIAGNOSIS — Z969 Presence of functional implant, unspecified: Secondary | ICD-10-CM | POA: Diagnosis not present

## 2014-12-31 DIAGNOSIS — G8929 Other chronic pain: Secondary | ICD-10-CM | POA: Diagnosis not present

## 2014-12-31 DIAGNOSIS — M25561 Pain in right knee: Secondary | ICD-10-CM | POA: Diagnosis not present

## 2014-12-31 DIAGNOSIS — E119 Type 2 diabetes mellitus without complications: Secondary | ICD-10-CM | POA: Diagnosis not present

## 2014-12-31 DIAGNOSIS — Z96653 Presence of artificial knee joint, bilateral: Secondary | ICD-10-CM | POA: Diagnosis not present

## 2014-12-31 DIAGNOSIS — R5381 Other malaise: Secondary | ICD-10-CM | POA: Diagnosis not present

## 2015-01-04 DIAGNOSIS — Z96653 Presence of artificial knee joint, bilateral: Secondary | ICD-10-CM | POA: Diagnosis not present

## 2015-01-04 DIAGNOSIS — E119 Type 2 diabetes mellitus without complications: Secondary | ICD-10-CM | POA: Diagnosis not present

## 2015-01-04 DIAGNOSIS — G8929 Other chronic pain: Secondary | ICD-10-CM | POA: Diagnosis not present

## 2015-01-04 DIAGNOSIS — M25561 Pain in right knee: Secondary | ICD-10-CM | POA: Diagnosis not present

## 2015-01-04 DIAGNOSIS — R5381 Other malaise: Secondary | ICD-10-CM | POA: Diagnosis not present

## 2015-01-04 DIAGNOSIS — Z969 Presence of functional implant, unspecified: Secondary | ICD-10-CM | POA: Diagnosis not present

## 2015-01-08 DIAGNOSIS — E785 Hyperlipidemia, unspecified: Secondary | ICD-10-CM | POA: Diagnosis not present

## 2015-01-08 DIAGNOSIS — I110 Hypertensive heart disease with heart failure: Secondary | ICD-10-CM | POA: Diagnosis not present

## 2015-01-08 DIAGNOSIS — Z96653 Presence of artificial knee joint, bilateral: Secondary | ICD-10-CM | POA: Diagnosis not present

## 2015-01-08 DIAGNOSIS — I509 Heart failure, unspecified: Secondary | ICD-10-CM | POA: Diagnosis not present

## 2015-01-08 DIAGNOSIS — Z87891 Personal history of nicotine dependence: Secondary | ICD-10-CM | POA: Diagnosis not present

## 2015-01-08 DIAGNOSIS — M170 Bilateral primary osteoarthritis of knee: Secondary | ICD-10-CM | POA: Diagnosis not present

## 2015-01-08 DIAGNOSIS — Z8673 Personal history of transient ischemic attack (TIA), and cerebral infarction without residual deficits: Secondary | ICD-10-CM | POA: Diagnosis not present

## 2015-01-08 DIAGNOSIS — N3281 Overactive bladder: Secondary | ICD-10-CM | POA: Diagnosis not present

## 2015-01-08 DIAGNOSIS — I4891 Unspecified atrial fibrillation: Secondary | ICD-10-CM | POA: Diagnosis not present

## 2015-01-08 DIAGNOSIS — H547 Unspecified visual loss: Secondary | ICD-10-CM | POA: Diagnosis not present

## 2015-01-08 DIAGNOSIS — E669 Obesity, unspecified: Secondary | ICD-10-CM | POA: Diagnosis not present

## 2015-01-08 DIAGNOSIS — E119 Type 2 diabetes mellitus without complications: Secondary | ICD-10-CM | POA: Diagnosis not present

## 2015-01-10 DIAGNOSIS — R5381 Other malaise: Secondary | ICD-10-CM | POA: Diagnosis not present

## 2015-01-10 DIAGNOSIS — Z96653 Presence of artificial knee joint, bilateral: Secondary | ICD-10-CM | POA: Diagnosis not present

## 2015-01-10 DIAGNOSIS — G8929 Other chronic pain: Secondary | ICD-10-CM | POA: Diagnosis not present

## 2015-01-10 DIAGNOSIS — Z969 Presence of functional implant, unspecified: Secondary | ICD-10-CM | POA: Diagnosis not present

## 2015-01-10 DIAGNOSIS — E119 Type 2 diabetes mellitus without complications: Secondary | ICD-10-CM | POA: Diagnosis not present

## 2015-01-10 DIAGNOSIS — M25561 Pain in right knee: Secondary | ICD-10-CM | POA: Diagnosis not present

## 2015-01-14 DIAGNOSIS — G8929 Other chronic pain: Secondary | ICD-10-CM | POA: Diagnosis not present

## 2015-01-14 DIAGNOSIS — Z96653 Presence of artificial knee joint, bilateral: Secondary | ICD-10-CM | POA: Diagnosis not present

## 2015-01-14 DIAGNOSIS — M25561 Pain in right knee: Secondary | ICD-10-CM | POA: Diagnosis not present

## 2015-01-14 DIAGNOSIS — E119 Type 2 diabetes mellitus without complications: Secondary | ICD-10-CM | POA: Diagnosis not present

## 2015-01-14 DIAGNOSIS — R5381 Other malaise: Secondary | ICD-10-CM | POA: Diagnosis not present

## 2015-01-14 DIAGNOSIS — Z969 Presence of functional implant, unspecified: Secondary | ICD-10-CM | POA: Diagnosis not present

## 2015-01-17 DIAGNOSIS — E119 Type 2 diabetes mellitus without complications: Secondary | ICD-10-CM | POA: Diagnosis not present

## 2015-01-17 DIAGNOSIS — Z969 Presence of functional implant, unspecified: Secondary | ICD-10-CM | POA: Diagnosis not present

## 2015-01-17 DIAGNOSIS — Z96653 Presence of artificial knee joint, bilateral: Secondary | ICD-10-CM | POA: Diagnosis not present

## 2015-01-17 DIAGNOSIS — G8929 Other chronic pain: Secondary | ICD-10-CM | POA: Diagnosis not present

## 2015-01-17 DIAGNOSIS — R5381 Other malaise: Secondary | ICD-10-CM | POA: Diagnosis not present

## 2015-01-17 DIAGNOSIS — M25561 Pain in right knee: Secondary | ICD-10-CM | POA: Diagnosis not present

## 2015-01-22 DIAGNOSIS — Z96653 Presence of artificial knee joint, bilateral: Secondary | ICD-10-CM | POA: Diagnosis not present

## 2015-01-22 DIAGNOSIS — R5381 Other malaise: Secondary | ICD-10-CM | POA: Diagnosis not present

## 2015-01-22 DIAGNOSIS — G8929 Other chronic pain: Secondary | ICD-10-CM | POA: Diagnosis not present

## 2015-01-22 DIAGNOSIS — M25561 Pain in right knee: Secondary | ICD-10-CM | POA: Diagnosis not present

## 2015-01-22 DIAGNOSIS — E119 Type 2 diabetes mellitus without complications: Secondary | ICD-10-CM | POA: Diagnosis not present

## 2015-01-22 DIAGNOSIS — Z969 Presence of functional implant, unspecified: Secondary | ICD-10-CM | POA: Diagnosis not present

## 2015-01-24 ENCOUNTER — Other Ambulatory Visit: Payer: Self-pay | Admitting: *Deleted

## 2015-01-24 DIAGNOSIS — I10 Essential (primary) hypertension: Secondary | ICD-10-CM

## 2015-01-24 MED ORDER — LISINOPRIL 40 MG PO TABS: 40.0000 mg | ORAL_TABLET | Freq: Every day | ORAL | Status: DC

## 2015-01-29 ENCOUNTER — Telehealth: Payer: Self-pay | Admitting: *Deleted

## 2015-01-29 DIAGNOSIS — Z96653 Presence of artificial knee joint, bilateral: Secondary | ICD-10-CM | POA: Diagnosis not present

## 2015-01-29 DIAGNOSIS — E119 Type 2 diabetes mellitus without complications: Secondary | ICD-10-CM | POA: Diagnosis not present

## 2015-01-29 DIAGNOSIS — R5381 Other malaise: Secondary | ICD-10-CM | POA: Diagnosis not present

## 2015-01-29 DIAGNOSIS — M25561 Pain in right knee: Secondary | ICD-10-CM | POA: Diagnosis not present

## 2015-01-29 DIAGNOSIS — G8929 Other chronic pain: Secondary | ICD-10-CM | POA: Diagnosis not present

## 2015-01-29 DIAGNOSIS — Z969 Presence of functional implant, unspecified: Secondary | ICD-10-CM | POA: Diagnosis not present

## 2015-01-29 NOTE — Telephone Encounter (Signed)
called to confirm that [facility] has refills on file, filled 01/24/15. they confirmed they have refills on file.

## 2015-01-31 DIAGNOSIS — G8929 Other chronic pain: Secondary | ICD-10-CM | POA: Diagnosis not present

## 2015-01-31 DIAGNOSIS — Z969 Presence of functional implant, unspecified: Secondary | ICD-10-CM | POA: Diagnosis not present

## 2015-01-31 DIAGNOSIS — R5381 Other malaise: Secondary | ICD-10-CM | POA: Diagnosis not present

## 2015-01-31 DIAGNOSIS — Z96653 Presence of artificial knee joint, bilateral: Secondary | ICD-10-CM | POA: Diagnosis not present

## 2015-01-31 DIAGNOSIS — E119 Type 2 diabetes mellitus without complications: Secondary | ICD-10-CM | POA: Diagnosis not present

## 2015-01-31 DIAGNOSIS — M25561 Pain in right knee: Secondary | ICD-10-CM | POA: Diagnosis not present

## 2015-02-05 DIAGNOSIS — Z969 Presence of functional implant, unspecified: Secondary | ICD-10-CM | POA: Diagnosis not present

## 2015-02-05 DIAGNOSIS — E119 Type 2 diabetes mellitus without complications: Secondary | ICD-10-CM | POA: Diagnosis not present

## 2015-02-05 DIAGNOSIS — R5381 Other malaise: Secondary | ICD-10-CM | POA: Diagnosis not present

## 2015-02-05 DIAGNOSIS — G8929 Other chronic pain: Secondary | ICD-10-CM | POA: Diagnosis not present

## 2015-02-05 DIAGNOSIS — M25561 Pain in right knee: Secondary | ICD-10-CM | POA: Diagnosis not present

## 2015-02-05 DIAGNOSIS — Z96653 Presence of artificial knee joint, bilateral: Secondary | ICD-10-CM | POA: Diagnosis not present

## 2015-02-14 DIAGNOSIS — E119 Type 2 diabetes mellitus without complications: Secondary | ICD-10-CM | POA: Diagnosis not present

## 2015-02-14 DIAGNOSIS — Z969 Presence of functional implant, unspecified: Secondary | ICD-10-CM | POA: Diagnosis not present

## 2015-02-14 DIAGNOSIS — Z96653 Presence of artificial knee joint, bilateral: Secondary | ICD-10-CM | POA: Diagnosis not present

## 2015-02-14 DIAGNOSIS — G8929 Other chronic pain: Secondary | ICD-10-CM | POA: Diagnosis not present

## 2015-02-14 DIAGNOSIS — R5381 Other malaise: Secondary | ICD-10-CM | POA: Diagnosis not present

## 2015-02-14 DIAGNOSIS — M25561 Pain in right knee: Secondary | ICD-10-CM | POA: Diagnosis not present

## 2015-02-19 ENCOUNTER — Other Ambulatory Visit: Payer: Self-pay | Admitting: Internal Medicine

## 2015-02-19 DIAGNOSIS — M25561 Pain in right knee: Secondary | ICD-10-CM | POA: Diagnosis not present

## 2015-02-19 DIAGNOSIS — E119 Type 2 diabetes mellitus without complications: Secondary | ICD-10-CM | POA: Diagnosis not present

## 2015-02-19 DIAGNOSIS — R5381 Other malaise: Secondary | ICD-10-CM | POA: Diagnosis not present

## 2015-02-19 DIAGNOSIS — G8929 Other chronic pain: Secondary | ICD-10-CM | POA: Diagnosis not present

## 2015-02-19 DIAGNOSIS — Z96653 Presence of artificial knee joint, bilateral: Secondary | ICD-10-CM | POA: Diagnosis not present

## 2015-02-19 DIAGNOSIS — Z969 Presence of functional implant, unspecified: Secondary | ICD-10-CM | POA: Diagnosis not present

## 2015-02-21 DIAGNOSIS — M25561 Pain in right knee: Secondary | ICD-10-CM | POA: Diagnosis not present

## 2015-02-21 DIAGNOSIS — G8929 Other chronic pain: Secondary | ICD-10-CM | POA: Diagnosis not present

## 2015-02-21 DIAGNOSIS — Z96653 Presence of artificial knee joint, bilateral: Secondary | ICD-10-CM | POA: Diagnosis not present

## 2015-02-21 DIAGNOSIS — Z969 Presence of functional implant, unspecified: Secondary | ICD-10-CM | POA: Diagnosis not present

## 2015-02-21 DIAGNOSIS — R5381 Other malaise: Secondary | ICD-10-CM | POA: Diagnosis not present

## 2015-02-21 DIAGNOSIS — E119 Type 2 diabetes mellitus without complications: Secondary | ICD-10-CM | POA: Diagnosis not present

## 2015-03-15 ENCOUNTER — Encounter: Payer: Self-pay | Admitting: Internal Medicine

## 2015-03-15 ENCOUNTER — Ambulatory Visit (INDEPENDENT_AMBULATORY_CARE_PROVIDER_SITE_OTHER): Payer: Commercial Managed Care - HMO | Admitting: Internal Medicine

## 2015-03-15 VITALS — BP 130/84 | HR 86 | Temp 98.8°F | Resp 12 | Ht 67.0 in | Wt 252.0 lb

## 2015-03-15 DIAGNOSIS — I872 Venous insufficiency (chronic) (peripheral): Secondary | ICD-10-CM

## 2015-03-15 MED ORDER — FUROSEMIDE 40 MG PO TABS: 40.0000 mg | ORAL_TABLET | Freq: Every day | ORAL | Status: DC | PRN

## 2015-03-15 NOTE — Patient Instructions (Signed)
We have sent in a prescription for a strong fluid pill called lasix (furosemide) that you will take 1 pill daily until the fluid is down. Then take it only as needed for when fluid starts to build up in your legs.   If you are taking it daily or having fluid daily please call us as we may need to check blood work for potassium.   Venous Stasis or Chronic Venous Insufficiency Chronic venous insufficiency, also called venous stasis, is a condition that affects the veins in the legs. The condition prevents blood from being pumped through these veins effectively. Blood may no longer be pumped effectively from the legs back to the heart. This condition can range from mild to severe. With proper treatment, you should be able to continue with an active life. CAUSES  Chronic venous insufficiency occurs when the vein walls become stretched, weakened, or damaged or when valves within the vein are damaged. Some common causes of this include:  High blood pressure inside the veins (venous hypertension).  Increased blood pressure in the leg veins from long periods of sitting or standing.  A blood clot that blocks blood flow in a vein (deep vein thrombosis).  Inflammation of a superficial vein (phlebitis) that causes a blood clot to form. RISK FACTORS Various things can make you more likely to develop chronic venous insufficiency, including:  Family history of this condition.  Obesity.  Pregnancy.  Sedentary lifestyle.  Smoking.  Jobs requiring long periods of standing or sitting in one place.  Being a certain age. Women in their 7s and 51s and men in their 68s are more likely to develop this condition. SIGNS AND SYMPTOMS  Symptoms may include:   Varicose veins.  Skin breakdown or ulcers.  Reddened or discolored skin on the leg.  Brown, smooth, tight, and painful skin just above the ankle, usually on the inside surface (lipodermatosclerosis).  Swelling. DIAGNOSIS  To diagnose this  condition, your health care provider will take a medical history and do a physical exam. The following tests may be ordered to confirm the diagnosis:  Duplex ultrasound--A procedure that produces a picture of a blood vessel and nearby organs and also provides information on blood flow through the blood vessel.  Plethysmography--A procedure that tests blood flow.  A venogram, or venography--A procedure used to look at the veins using X-ray and dye. TREATMENT The goals of treatment are to help you return to an active life and to minimize pain or disability. Treatment will depend on the severity of the condition. Medical procedures may be needed for severe cases. Treatment options may include:   Use of compression stockings. These can help with symptoms and lower the chances of the problem getting worse, but they do not cure the problem.  Sclerotherapy--A procedure involving an injection of a material that "dissolves" the damaged veins. Other veins in the network of blood vessels take over the function of the damaged veins.  Surgery to remove the vein or cut off blood flow through the vein (vein stripping or laser ablation surgery).  Surgery to repair a valve. HOME CARE INSTRUCTIONS   Wear compression stockings as directed by your health care provider.  Only take over-the-counter or prescription medicines for pain, discomfort, or fever as directed by your health care provider.  Follow up with your health care provider as directed. SEEK MEDICAL CARE IF:   You have redness, swelling, or increasing pain in the affected area.  You see a red streak or line  that extends up or down from the affected area.  You have a breakdown or loss of skin in the affected area, even if the breakdown is small.  You have an injury to the affected area. SEEK IMMEDIATE MEDICAL CARE IF:   You have an injury and open wound in the affected area.  Your pain is severe and does not improve with medicine.  You  have sudden numbness or weakness in the foot or ankle below the affected area, or you have trouble moving your foot or ankle.  You have a fever or persistent symptoms for more than 2-3 days.  You have a fever and your symptoms suddenly get worse. MAKE SURE YOU:   Understand these instructions.  Will watch your condition.  Will get help right away if you are not doing well or get worse.   This information is not intended to replace advice given to you by your health care provider. Make sure you discuss any questions you have with your health care provider.   Document Released: 07/27/2006 Document Revised: 01/11/2013 Document Reviewed: 11/28/2012 Elsevier Interactive Patient Education [employer]2016 [employer]vier Inc.

## 2015-03-15 NOTE — Progress Notes (Signed)
Pre visit review using our clinic review tool, if applicable. No additional management support is needed unless otherwise documented below in the visit note. 

## 2015-03-15 NOTE — Assessment & Plan Note (Signed)
Rx for lasix 40 mg for several days to help with the swelling. Can use prn. No tenderness in the calves and was taking eliquis so no risk of DVT.

## 2015-03-15 NOTE — Progress Notes (Signed)
   Subjective:    Patient ID: Robin Snow, female    DOB: [birth year], 74 y.o.   MRN: [id number]  HPI The patient is a 74 YO female coming in for swelling in her legs. [place] and [place] that it was related. She has not been able to get rid of the swelling. Admits to some increase in salt intake with the holidays but avoided ham as she knew it was bad for her. Has been elevating her legs without much improvement. She has been taking her blood thinner as prescribed without missing any doses.   Review of Systems  Constitutional: Negative for fever, activity change, appetite change, fatigue and unexpected weight change.  Respiratory: Negative for cough, chest tightness, shortness of breath and wheezing.   Cardiovascular: Positive for leg swelling. Negative for chest pain and palpitations.  Gastrointestinal: Negative.   Musculoskeletal: Positive for myalgias and arthralgias. Negative for gait problem.  Skin: Negative.   Neurological: Negative for dizziness, weakness and headaches.      Objective:   Physical Exam  Constitutional: She is oriented to person, place, and time. She appears well-developed and well-nourished.  Obese  HENT:  Head: Normocephalic and atraumatic.  Eyes: EOM are normal.  Neck: Normal range of motion.  Cardiovascular: Normal rate.   Irregularly irregular  Pulmonary/Chest: Effort normal and breath sounds normal. No respiratory distress. She has no wheezes. She has no rales.  Abdominal: Soft. Bowel sounds are normal. She exhibits no distension. There is no tenderness.  Musculoskeletal: She exhibits edema.  Bilateral 2+ edema to midshin  Neurological: She is alert and oriented to person, place, and time. Coordination normal.  Skin: Skin is warm and dry. No rash noted.  NO calf tenderness   Filed Vitals:   03/15/15 0958  BP: 130/84  Pulse: 86  Temp: 98.8 F (37.1 C)  TempSrc: Oral  Resp: 12  Height: 5\' 7"  (1.702 m)  Weight: 252 lb  (114.306 kg)  SpO2: 96%      Assessment & Plan:

## 2015-03-22 ENCOUNTER — Encounter: Payer: Self-pay | Admitting: Neurology

## 2015-03-22 ENCOUNTER — Ambulatory Visit (INDEPENDENT_AMBULATORY_CARE_PROVIDER_SITE_OTHER): Payer: Commercial Managed Care - HMO | Admitting: Neurology

## 2015-03-22 VITALS — BP 144/91 | HR 86 | Ht 67.0 in | Wt 253.0 lb

## 2015-03-22 DIAGNOSIS — I6312 Cerebral infarction due to embolism of basilar artery: Secondary | ICD-10-CM | POA: Diagnosis not present

## 2015-03-22 DIAGNOSIS — I10 Essential (primary) hypertension: Secondary | ICD-10-CM | POA: Diagnosis not present

## 2015-03-22 DIAGNOSIS — I482 Chronic atrial fibrillation, unspecified: Secondary | ICD-10-CM

## 2015-03-22 DIAGNOSIS — E785 Hyperlipidemia, unspecified: Secondary | ICD-10-CM

## 2015-03-22 NOTE — Patient Instructions (Signed)
-   continue eliquis and lovastatin for stroke prevention - check BP and glucose at home - Follow up with your primary care physician for stroke risk factor modification. Recommend maintain blood pressure goal <130/80, diabetes with hemoglobin A1c goal below 6.5% and lipids with LDL cholesterol goal below 70 mg/dL.  - follow up with cardiologist and orthopedics as scheduled - continue home exercise - follow up in one year

## 2015-03-22 NOTE — Progress Notes (Signed)
STROKE NEUROLOGY FOLLOW UP NOTE  NAME: Robin Snow DOB: 14-Oct-1940  REASON FOR VISIT: stroke follow up HISTORY FROM: pt and chart  Today we had the pleasure of seeing Robin Snow in follow-up at our Neurology Clinic. Pt was accompanied by daughter.   History Summary Robin Snow is a 74 y.o. female with history of HTN, DM, afib not on anticoagulation due to history of GIB from diverticular source. She had acute onset right arm weakness on 11/29/2013 on waking up. On EMS arrival to the scene, she has had significant improvement in her right arm. Patient was not administered TPA secondary to delay in arrival. She was admitted for further evaluation and treatment.MRI showed punctate acute infarction at the right inferior pons. Normal MRA. Carotid Dopplers unremarkable. LDL was 62 and Hgb a1c 5.7 on Glipizide. She has history of diverticular bleed x 2 while on warfarin which has been discontinued. She had an colonoscopy but no culprit lesion was found. She was started on Eliquis for stroke prevention.   01/10/14 follow up - Blood pressure is well controlled, it is 110/78 in the office today. She feels she is back to her baseline from stroke, is only having problems with chronic right knee pain. She is getting Home PT for this. She has no new complaints today.   05/14/14 follow up - the patient has been doing well.  No recurrent stroke symptoms. She is on Eliquis now, however she had financial difficulties with continue Eliquis. Showed patient and her daughter the waist to get coupon from [employer]. Patient also waiting for some financial program support. She stated that her blood pressure and glucose at home was controlled. Her blood pressure 121/74 today.  She came in on wheelchair today because bilaterally knee pain. She denies any weakness of lower extremities.  09/07/14 follow up - pt has been doing well from stroke standpoint. However, she still has bilateral knee pain which  limited her activity and exercise. She is basically wheelchair bound. Today in clinic she had right knee pain, swollen, warm, and redness. She stated that this happened intermittently along with right-sided knee pain. She has an appointment with pain management Dr. Myna Hidalgo in 1 week who is treating her for knee pain with knee joint aspirations. She is still taking eliquis although she continued to have financial difficulties. Her blood pressure today in clinic 125/73.  Interval History During the interval time, pt has been doing well. Right knee pain much better and right leg weakness also much improved. Now she has started to have left knee pain, and has been following with Dr. Myna Hidalgo. Has appointment next month. She just finished PT 2 weeks ago, now able to walk with walker or cane at home. On eliquis tolerating well, no GIB. BP 144/91 today in clinic.  REVIEW OF SYSTEMS: Full 14 system review of systems performed and notable only for those listed below and in HPI above, all others are negative:  Constitutional:   Cardiovascular:  Ear/Nose/Throat:   Skin:  Eyes:   Respiratory:   Gastroitestinal:   Genitourinary:  Hematology/Lymphatic:   Endocrine: cold intolerance Musculoskeletal:   Allergy/Immunology:   Neurological:   Psychiatric:  Sleep:   The following represents the patient's updated allergies and side effects list: Allergies  Allergen Reactions  . Aspirin Hives  . Hydrocodone Hives    Tolerates oxycodone and tramadol  . Rofecoxib Hives    The neurologically relevant items on the patient's problem list were reviewed on today's  visit.  Neurologic Examination  A problem focused neurological exam (12 or more points of the single system neurologic examination, vital signs counts as 1 point, cranial nerves count for 8 points) was performed.  Blood pressure 144/91, pulse 86, height 5\' 7"  (1.702 m), weight 253 lb (114.76 kg).  General - Well nourished, well developed, in no apparent  distress.  Ophthalmologic - fundi not visualized due to incorporation.  Cardiovascular - irregularly irregular heart rate and rhythm.  Mental Status -  Level of arousal and orientation to time, place, and person were intact. Language including expression, naming, repetition, comprehension was assessed and found intact.  Cranial Nerves II - XII - II - Visual field intact OU. III, IV, VI - Extraocular movements intact. V - Facial sensation intact bilaterally. VII - Facial movement intact bilaterally. VIII - Hearing & vestibular intact bilaterally. X - Palate elevates symmetrically. XI - Chin turning & shoulder shrug intact bilaterally. XII - Tongue protrusion intact.  Motor Strength - The patient's strength was 4+/5 in all extremities and pronator drift was absent. Bulk was normal and fasciculations were absent.   Motor Tone - Muscle tone was assessed at the neck and appendages and was normal.  Reflexes - The patient's reflexes were 1+ in all extremities and she had no pathological reflexes.  Sensory - Light touch, temperature/pinprick were assessed and were normal.    Coordination - The patient had normal movements in the hands with no ataxia or dysmetria.  Tremor was absent.  Gait and Station - in wheelchair, not tested due to safety concerns.  Data reviewed: I personally reviewed the images and agree with the radiology interpretations.  Ct Head Wo Contrast  11/29/2013 IMPRESSION: No acute intracranial abnormalities. Chronic atrophy and small vessel ischemic changes.  Mri and Mra Brain Without Contrast  11/29/2013 IMPRESSION: Punctate acute infarction at the right inferior pons. Moderate chronic small-vessel changes affecting the cerebral hemispheric white matter. Normal MR angiography of the large and medium size vessels.    CUS - Bilateral: 1-39% ICA stenosis. Vertebral artery flow is antegrade.  2D echo - - The patient was in atrial fibrillation. Normal LV size  with mild LV hypertrophy. EF 60-65%. Normal RV size and systolic function. Severe biatrial enlargement.  Component     Latest Ref Rng 11/13/2013 11/29/2013 11/30/2013 05/11/2014  Cholesterol     0 - 200 mg/dL   086109 [id number]  Triglycerides     0.0 - 149.0 mg/dL   68 469.6135.0  HDL     >29.52>39.00 mg/dL   33 (L) 84.1332.20 (L)  VLDL     0.0 - 40.0 mg/dL   14 24.427.0  LDL (calc)     0 - 99 mg/dL   62 92  Total CHOL/HDL Ratio        3.3 5  NonHDL         118.80  Hemoglobin A1C     4.6 - 6.5 %  5.7 (H)  5.7  Mean Plasma Glucose     <117 mg/dL  010117 (H)    TSH     2.7250.350 - 4.500 uIU/mL 0.938       Assessment: As you may recall, she is a 74 y.o. African American female with PMH of HTN, DM, afib off coumadin due to history of GIB from diverticular source (however colonoscopy negative) admitted on 11/29/2013 for right arm weakness.MRI showed punctate acute infarction at the right inferior pons. Normal MRA. LDL 62 and Hgb a1c 5.7. She was started  on Eliquis for stroke prevention. During the interval time, she continued on eliquis and tolerating well, no GIB. Knee pain improved and able to walk with walker and cane at home. Last LDL 92.  Plan:  - continue eliquis and lovastatin for stroke prevention - check BP and glucose at home - Follow up with your primary care physician for stroke risk factor modification. Recommend maintain blood pressure goal <130/80, diabetes with hemoglobin A1c goal below 6.5% and lipids with LDL cholesterol goal below 70 mg/dL.  - follow up with cardiologist and orthopedics as scheduled - continue home exercise - follow up in one year  No orders of the defined types were placed in this encounter.    No orders of the defined types were placed in this encounter.    Patient Instructions  - continue eliquis and lovastatin for stroke prevention - check BP and glucose at home - Follow up with your primary care physician for stroke risk factor modification. Recommend maintain blood  pressure goal <130/80, diabetes with hemoglobin A1c goal below 6.5% and lipids with LDL cholesterol goal below 70 mg/dL.  - follow up with cardiologist and orthopedics as scheduled - continue home exercise - follow up in one year    Marvel Plan, MD PhD [facility] [address], [address] [place], [place] [address] [phone]

## 2015-03-29 ENCOUNTER — Telehealth: Payer: Self-pay | Admitting: Internal Medicine

## 2015-04-04 ENCOUNTER — Telehealth: Payer: Self-pay | Admitting: Internal Medicine

## 2015-04-04 NOTE — Telephone Encounter (Signed)
Pt is requesting refill for apixaban (ELIQUIS) 5 MG TABS tablet [[id number] She is requesting half the amount to go to [facility] on [address]. And the rest of the prescription to go to [facility]

## 2015-04-05 MED ORDER — APIXABAN 5 MG PO TABS: 5.0000 mg | ORAL_TABLET | Freq: Two times a day (BID) | ORAL | Status: DC

## 2015-04-05 NOTE — Telephone Encounter (Signed)
Received call pt requesting samples of Eliquis  Inform pt we didn't have any samples. Ask if I could [facility] pt [facility] for [facility] 90 day its 400 plus dollars. For a week supply #6 $18.00. Wanting to know is there anything else she can take that is cheaper...Raechel Chute/lmb

## 2015-04-05 NOTE — Addendum Note (Signed)
Addended by: Deatra JamesBRAND, Delayza Lungren M on: 04/05/2015 04:15 PM   Modules accepted: Orders

## 2015-04-05 NOTE — Telephone Encounter (Signed)
Notified pt with md response. Pt states she was already taking coumadin before still had 2 blood clots so they wanted her to take the Eliquis because it would help more. Once [facility] send it will only cost $47, but need enough until they send rx. Inform pt will send a week supply to [facility]...Raechel Chute/lmb

## 2015-04-05 NOTE — Telephone Encounter (Signed)
She could also call cardiology to see if they have samples. They can also check with the drug maker to see if they have discount program available. There are cheaper medicine called warfarin (coumadin) which requires more monitoring with visits to coumadin clinic for lab checks. She could come in and talk to us or schedule with cardiology to discuss.

## 2015-04-09 ENCOUNTER — Other Ambulatory Visit: Payer: Self-pay | Admitting: Geriatric Medicine

## 2015-04-09 MED ORDER — APIXABAN 5 MG PO TABS: 5.0000 mg | ORAL_TABLET | Freq: Two times a day (BID) | ORAL | Status: DC

## 2015-04-09 NOTE — Telephone Encounter (Signed)
[facility] and [facility] day supply [facility].

## 2015-04-17 ENCOUNTER — Telehealth: Payer: Self-pay | Admitting: Internal Medicine

## 2015-04-17 ENCOUNTER — Other Ambulatory Visit: Payer: Self-pay | Admitting: Internal Medicine

## 2015-04-17 NOTE — Telephone Encounter (Signed)
Pt's daughter Shanda BumpsJessica is requesting a referral to be sent to [facility] [place] She states she has an appt on 2/3 She can be reached at [phone]

## 2015-04-18 NOTE — Telephone Encounter (Signed)
For what?

## 2015-04-19 NOTE — Telephone Encounter (Signed)
Patient has an appt with them and they are telling her they need a referral from her PCP. She is having right knee pain.

## 2015-04-19 NOTE — Telephone Encounter (Signed)
I would not recommend pain management for right knee pain. Has she seen orthopedics? If not find out if she is willing to go. If not then needs visit to discuss.

## 2015-04-22 NOTE — Telephone Encounter (Signed)
Spoke with patient and she has been to ortho. She scheduled an appt for Friday to be seen.

## 2015-04-26 ENCOUNTER — Ambulatory Visit (INDEPENDENT_AMBULATORY_CARE_PROVIDER_SITE_OTHER): Payer: Commercial Managed Care - HMO | Admitting: Internal Medicine

## 2015-04-26 ENCOUNTER — Encounter: Payer: Self-pay | Admitting: Internal Medicine

## 2015-04-26 ENCOUNTER — Other Ambulatory Visit (INDEPENDENT_AMBULATORY_CARE_PROVIDER_SITE_OTHER): Payer: Commercial Managed Care - HMO

## 2015-04-26 VITALS — BP 132/76 | HR 89 | Temp 99.4°F | Resp 16 | Ht 67.0 in | Wt 258.0 lb

## 2015-04-26 DIAGNOSIS — E1141 Type 2 diabetes mellitus with diabetic mononeuropathy: Secondary | ICD-10-CM

## 2015-04-26 DIAGNOSIS — M25561 Pain in right knee: Secondary | ICD-10-CM

## 2015-04-26 LAB — LIPID PANEL
CHOL/HDL RATIO: 4
Cholesterol: 129 mg/dL (ref 0–200)
HDL: 33.7 mg/dL — ABNORMAL LOW (ref >39.00)
LDL CALC: 69 mg/dL (ref 0–99)
NONHDL: 95.07
Triglycerides: 129 mg/dL (ref 0.0–149.0)
VLDL: 25.8 mg/dL (ref 0.0–40.0)

## 2015-04-26 LAB — HEMOGLOBIN A1C: Hgb A1c MFr Bld: 5.6 % (ref 4.6–6.5)

## 2015-04-26 NOTE — Progress Notes (Signed)
Pre visit review using our clinic review tool, if applicable. No additional management support is needed unless otherwise documented below in the visit note. 

## 2015-04-26 NOTE — Patient Instructions (Signed)
We have sent in the referral and are checking the blood work today.   If the sugars are still good we will stop the amaryl (we will call to let you know).

## 2015-04-28 NOTE — Assessment & Plan Note (Signed)
Referral to pain management done today, she will continue to see orthopedic as well. No indication for imaging as no acute injury.

## 2015-04-28 NOTE — Progress Notes (Signed)
   Subjective:    Patient ID: Robin Snow, female    DOB: 1940-08-03, 75 y.o.   MRN: [id number]  HPI The patient is a 75 YO female coming in for right knee pain. Has seen orthopedics and had several surgeries on her knee. She was recommended to go to pain management for it. She has appointment and needs referral. No recent falls or injury to the knee and no swelling. Taking medication for pain from her orthopedic.   Review of Systems  Constitutional: Negative for fever, activity change, appetite change, fatigue and unexpected weight change.  Respiratory: Negative for cough, chest tightness, shortness of breath and wheezing.   Cardiovascular: Negative for chest pain and palpitations.  Gastrointestinal: Negative.   Musculoskeletal: Positive for myalgias and arthralgias. Negative for gait problem.  Skin: Negative.   Neurological: Negative for dizziness, weakness and headaches.      Objective:   Physical Exam  Constitutional: She is oriented to person, place, and time. She appears well-developed and well-nourished.  Obese  HENT:  Head: Normocephalic and atraumatic.  Eyes: EOM are normal.  Neck: Normal range of motion.  Cardiovascular: Normal rate.   Irregularly irregular  Pulmonary/Chest: Effort normal and breath sounds normal. No respiratory distress. She has no wheezes. She has no rales.  Abdominal: Soft. Bowel sounds are normal. She exhibits no distension. There is no tenderness.  Musculoskeletal: She exhibits tenderness.  Neurological: She is alert and oriented to person, place, and time. Coordination normal.  Skin: Skin is warm and dry. No rash noted.   Filed Vitals:   04/26/15 1627  BP: 132/76  Pulse: 89  Temp: 99.4 F (37.4 C)  TempSrc: Oral  Resp: 16  Height:  (1.702 m)  Weight: 258 lb (117.028 kg)  SpO2: 96%      Assessment & Plan:

## 2015-04-29 LAB — VITAMIN B12: VITAMIN B 12: 332 pg/mL (ref 211–911)

## 2015-05-10 DIAGNOSIS — Z9889 Other specified postprocedural states: Secondary | ICD-10-CM | POA: Diagnosis not present

## 2015-05-10 DIAGNOSIS — M25561 Pain in right knee: Secondary | ICD-10-CM | POA: Diagnosis not present

## 2015-05-10 DIAGNOSIS — G8929 Other chronic pain: Secondary | ICD-10-CM | POA: Diagnosis not present

## 2015-05-10 DIAGNOSIS — Z96653 Presence of artificial knee joint, bilateral: Secondary | ICD-10-CM | POA: Diagnosis not present

## 2015-05-25 ENCOUNTER — Encounter: Payer: Self-pay | Admitting: Internal Medicine

## 2015-05-25 ENCOUNTER — Ambulatory Visit (INDEPENDENT_AMBULATORY_CARE_PROVIDER_SITE_OTHER): Payer: Commercial Managed Care - HMO | Admitting: Internal Medicine

## 2015-05-25 VITALS — BP 150/92 | HR 82 | Temp 98.4°F | Resp 18

## 2015-05-25 DIAGNOSIS — Z23 Encounter for immunization: Secondary | ICD-10-CM | POA: Diagnosis not present

## 2015-05-25 DIAGNOSIS — J22 Unspecified acute lower respiratory infection: Secondary | ICD-10-CM | POA: Insufficient documentation

## 2015-05-25 DIAGNOSIS — J988 Other specified respiratory disorders: Secondary | ICD-10-CM | POA: Diagnosis not present

## 2015-05-25 MED ORDER — PROMETHAZINE-DM 6.25-15 MG/5ML PO SYRP: 5.0000 mL | ORAL_SOLUTION | Freq: Four times a day (QID) | ORAL | Status: DC | PRN

## 2015-05-25 MED ORDER — AMOXICILLIN-POT CLAVULANATE 875-125 MG PO TABS: 1.0000 | ORAL_TABLET | Freq: Two times a day (BID) | ORAL | Status: AC

## 2015-05-25 NOTE — Progress Notes (Signed)
Subjective:  Patient ID: Robin Snow, female    DOB: 1940/06/25  Age: 75 y.o. MRN: [id number]  CC: Cough   HPI Robin Snow presents for one-week history of cough productive of thick yellow phlegm with sore throat and chills. She denies fever, shortness of breath, wheezing, or chest pain.  Outpatient Prescriptions Prior to Visit  Medication Sig Dispense Refill  . ACCU-CHEK SMARTVIEW test strip TEST TWO TIMES DAILY AS DIRECTED 200 each 5  . apixaban (ELIQUIS) 5 MG TABS tablet Take 1 tablet (5 mg total) by mouth 2 (two) times daily. 180 tablet 3  . Blood Glucose Monitoring Suppl (ACCU-CHEK NANO SMARTVIEW) w/Device KIT USE TWICE DAILY AS INSTRUCTED 1 kit 0  . diltiazem (CARDIZEM CD) 180 MG 24 hr capsule Take 1 capsule (180 mg total) by mouth daily. 90 capsule 3  . docusate sodium (COLACE) 100 MG capsule TAKE ONE CAPSULE BY MOUTH DAILY 30 capsule 0  . furosemide (LASIX) 40 MG tablet Take 1 tablet (40 mg total) by mouth daily as needed for fluid. 30 tablet 0  . gabapentin (NEURONTIN) 100 MG capsule Take 1 capsule (100 mg total) by mouth at bedtime. 90 capsule 3  . gabapentin (NEURONTIN) 300 MG capsule Take 1 capsule (300 mg total) by mouth 3 (three) times daily. TAKE 1 CAPSULE (300 MG TOTAL) BY MOUTH 3 TIMES DAILY. 270 capsule 3  . glimepiride (AMARYL) 2 MG tablet TAKE 1 TABLET (2 MG TOTAL) BY MOUTH DAILY BEFORE BREAKFAST. 90 tablet 3  . hydrochlorothiazide (MICROZIDE) 12.5 MG capsule Take 1 capsule (12.5 mg total) by mouth daily. 90 capsule 3  . lisinopril (PRINIVIL,ZESTRIL) 40 MG tablet Take 1 tablet (40 mg total) by mouth daily. 90 tablet 3  . lovastatin (MEVACOR) 20 MG tablet Take 1 tablet (20 mg total) by mouth at bedtime. 90 tablet 3  . metoprolol tartrate (LOPRESSOR) 25 MG tablet Take 0.5 tablets (12.5 mg total) by mouth 2 (two) times daily. 90 tablet 3  . oxybutynin (DITROPAN) 5 MG tablet TAKE 1 TABLET BY MOUTH DAILY 90 tablet 1  . potassium chloride (KLOR-CON) 20 MEQ packet  Take 20 mEq by mouth daily.     No facility-administered medications prior to visit.    ROS Review of Systems  Constitutional: Negative.  Negative for fever, chills, diaphoresis and fatigue.  HENT: Positive for sore throat. Negative for congestion, facial swelling, sinus pressure and trouble swallowing.   Eyes: Negative.   Respiratory: Positive for cough. Negative for apnea, choking, chest tightness, shortness of breath, wheezing and stridor.   Cardiovascular: Negative.  Negative for chest pain, palpitations and leg swelling.  Gastrointestinal: Negative.  Negative for nausea, vomiting, abdominal pain and diarrhea.  Endocrine: Negative.   Genitourinary: Negative.   Musculoskeletal: Negative.   Skin: Negative.  Negative for color change and rash.  Allergic/Immunologic: Negative.   Neurological: Negative.   Hematological: Negative.  Negative for adenopathy. Does not bruise/bleed easily.  Psychiatric/Behavioral: Negative.     Objective:  BP 150/92 mmHg  Pulse 82  Temp(Src) 98.4 F (36.9 C) (Oral)  Resp 18  SpO2 98%  BP Readings from Last 3 Encounters:  05/25/15 150/92  04/26/15 132/76  03/22/15 144/91    Wt Readings from Last 3 Encounters:  04/26/15 258 lb (117.028 kg)  03/22/15 253 lb (114.76 kg)  03/15/15 252 lb (114.306 kg)    Physical Exam  Constitutional: She is oriented to person, place, and time.  Non-toxic appearance. She does not have a sickly appearance. She   Subjective:  Patient ID: Robin Snow, female    DOB: 05/26/1940  Age: 75 y.o. MRN: [id number]  CC: Cough   HPI Robin Snow presents for one-week history of cough productive of thick yellow phlegm with sore throat and chills. She denies fever, shortness of breath, wheezing, or chest pain.  Outpatient Prescriptions Prior to Visit  Medication Sig Dispense Refill  . ACCU-CHEK SMARTVIEW test strip TEST TWO TIMES DAILY AS DIRECTED 200 each 5  . apixaban (ELIQUIS) 5 MG TABS tablet Take 1 tablet (5 mg total) by mouth 2 (two) times daily. 180 tablet 3  . Blood Glucose Monitoring Suppl (ACCU-CHEK NANO SMARTVIEW) w/Device KIT USE TWICE DAILY AS INSTRUCTED 1 kit 0  . diltiazem (CARDIZEM CD) 180 MG 24 hr capsule Take 1 capsule (180 mg total) by mouth daily. 90 capsule 3  . docusate sodium (COLACE) 100 MG capsule TAKE ONE CAPSULE BY MOUTH DAILY 30 capsule 0  . furosemide (LASIX) 40 MG tablet Take 1 tablet (40 mg total) by mouth daily as needed for fluid. 30 tablet 0  . gabapentin (NEURONTIN) 100 MG capsule Take 1 capsule (100 mg total) by mouth at bedtime. 90 capsule 3  . gabapentin (NEURONTIN) 300 MG capsule Take 1 capsule (300 mg total) by mouth 3 (three) times daily. TAKE 1 CAPSULE (300 MG TOTAL) BY MOUTH 3 TIMES DAILY. 270 capsule 3  . glimepiride (AMARYL) 2 MG tablet TAKE 1 TABLET (2 MG TOTAL) BY MOUTH DAILY BEFORE BREAKFAST. 90 tablet 3  . hydrochlorothiazide (MICROZIDE) 12.5 MG capsule Take 1 capsule (12.5 mg total) by mouth daily. 90 capsule 3  . lisinopril (PRINIVIL,ZESTRIL) 40 MG tablet Take 1 tablet (40 mg total) by mouth daily. 90 tablet 3  . lovastatin (MEVACOR) 20 MG tablet Take 1 tablet (20 mg total) by mouth at bedtime. 90 tablet 3  . metoprolol tartrate (LOPRESSOR) 25 MG tablet Take 0.5 tablets (12.5 mg total) by mouth 2 (two) times daily. 90 tablet 3  . oxybutynin (DITROPAN) 5 MG tablet TAKE 1 TABLET BY MOUTH DAILY 90 tablet 1  . potassium chloride (KLOR-CON) 20 MEQ packet  Take 20 mEq by mouth daily.     No facility-administered medications prior to visit.    ROS Review of Systems  Constitutional: Negative.  Negative for fever, chills, diaphoresis and fatigue.  HENT: Positive for sore throat. Negative for congestion, facial swelling, sinus pressure and trouble swallowing.   Eyes: Negative.   Respiratory: Positive for cough. Negative for apnea, choking, chest tightness, shortness of breath, wheezing and stridor.   Cardiovascular: Negative.  Negative for chest pain, palpitations and leg swelling.  Gastrointestinal: Negative.  Negative for nausea, vomiting, abdominal pain and diarrhea.  Endocrine: Negative.   Genitourinary: Negative.   Musculoskeletal: Negative.   Skin: Negative.  Negative for color change and rash.  Allergic/Immunologic: Negative.   Neurological: Negative.   Hematological: Negative.  Negative for adenopathy. Does not bruise/bleed easily.  Psychiatric/Behavioral: Negative.     Objective:  BP 150/92 mmHg  Pulse 82  Temp(Src) 98.4 F (36.9 C) (Oral)  Resp 18  SpO2 98%  BP Readings from Last 3 Encounters:  05/25/15 150/92  04/26/15 132/76  03/22/15 144/91    Wt Readings from Last 3 Encounters:  04/26/15 258 lb (117.028 kg)  03/22/15 253 lb (114.76 kg)  03/15/15 252 lb (114.306 kg)    Physical Exam  Constitutional: She is oriented to person, place, and time.  Non-toxic appearance. She does not have a sickly appearance. She

## 2015-05-25 NOTE — Patient Instructions (Signed)

## 2015-05-25 NOTE — Progress Notes (Signed)
Pre visit review using our clinic review tool, if applicable. No additional management support is needed unless otherwise documented below in the visit note. 

## 2015-05-31 ENCOUNTER — Ambulatory Visit (INDEPENDENT_AMBULATORY_CARE_PROVIDER_SITE_OTHER): Payer: Commercial Managed Care - HMO

## 2015-05-31 VITALS — BP 130/80 | HR 75 | Ht 67.0 in | Wt 255.0 lb

## 2015-05-31 DIAGNOSIS — Z0000 Encounter for general adult medical examination without abnormal findings: Secondary | ICD-10-CM | POA: Diagnosis not present

## 2015-05-31 NOTE — Patient Instructions (Addendum)
Ms. Moder , Thank you for taking time to come for your Medicare Wellness Visit. I appreciate your ongoing commitment to your health goals. Please review the following plan we discussed and let me know if I can assist you in the future.   Had flu shot x 1 week ago; came to clinic because she was sick;  Will come in 3 to 4 weeks when feeling better to take pneumonia vaccine; (prevnar 13 )   Never had the chicken pox;   Will schedule eye exam soon    These are the goals we discussed: Goals    . patient     Wants to be independent; Keep moving;  [employer] classes or Y / can get pool for water aerobics       This is a list of the screening recommended for you and due dates:  Health Maintenance  Topic Date Due  . Complete foot exam   09/01/1949  . Eye exam for diabetics  09/01/1949  . Tetanus Vaccine  09/02/1958  . Shingles Vaccine  09/02/1999  . Pneumonia vaccines (1 of 2 - PCV13) 09/01/2004  . Hemoglobin A1C  10/24/2015  . Flu Shot  11/05/2015  . Colon Cancer Screening  07/19/2023  . DEXA scan (bone density measurement)  Completed     Fall Prevention in the Home  Falls can cause injuries. They can happen to people of all ages. There are many things you can do to make your home safe and to help prevent falls.  WHAT CAN I DO ON THE OUTSIDE OF MY HOME?  Regularly fix the edges of walkways and driveways and fix any cracks.  Remove anything that might make you trip as you walk through a door, such as a raised step or threshold.  Trim any bushes or trees on the path to your home.  Use bright outdoor lighting.  Clear any walking paths of anything that might make someone trip, such as rocks or tools.  Regularly check to see if handrails are loose or broken. Make sure that both sides of any steps have handrails.  Any raised decks and porches should have guardrails on the edges.  Have any leaves, snow, or ice cleared regularly.  Use sand or salt on walking paths during  winter.  Clean up any spills in your garage right away. This includes oil or grease spills. WHAT CAN I DO IN THE BATHROOM?   Use night lights.  Install grab bars by the toilet and in the tub and shower. Do not use towel bars as grab bars.  Use non-skid mats or decals in the tub or shower.  If you need to sit down in the shower, use a plastic, non-slip stool.  Keep the floor dry. Clean up any water that spills on the floor as soon as it happens.  Remove soap buildup in the tub or shower regularly.  Attach bath mats securely with double-sided non-slip rug tape.  Do not have throw rugs and other things on the floor that can make you trip. WHAT CAN I DO IN THE BEDROOM?  Use night lights.  Make sure that you have a light by your bed that is easy to reach.  Do not use any sheets or blankets that are too big for your bed. They should not hang down onto the floor.  Have a firm chair that has side arms. You can use this for support while you get dressed.  Do not have throw rugs and other  things on the floor that can make you trip. WHAT CAN I DO IN THE KITCHEN?  Clean up any spills right away.  Avoid walking on wet floors.  Keep items that you use a lot in easy-to-reach places.  If you need to reach something above you, use a strong step stool that has a grab bar.  Keep electrical cords out of the way.  Do not use floor polish or wax that makes floors slippery. If you must use wax, use non-skid floor wax.  Do not have throw rugs and other things on the floor that can make you trip. WHAT CAN I DO WITH MY STAIRS?  Do not leave any items on the stairs.  Make sure that there are handrails on both sides of the stairs and use them. Fix handrails that are broken or loose. Make sure that handrails are as long as the stairways.  Check any carpeting to make sure that it is firmly attached to the stairs. Fix any carpet that is loose or worn.  Avoid having throw rugs at the top or  bottom of the stairs. If you do have throw rugs, attach them to the floor with carpet tape.  Make sure that you have a light switch at the top of the stairs and the bottom of the stairs. If you do not have them, ask someone to add them for you. WHAT ELSE CAN I DO TO HELP PREVENT FALLS?  Wear shoes that:  Do not have high heels.  Have rubber bottoms.  Are comfortable and fit you well.  Are closed at the toe. Do not wear sandals.  If you use a stepladder:  Make sure that it is fully opened. Do not climb a closed stepladder.  Make sure that both sides of the stepladder are locked into place.  Ask someone to hold it for you, if possible.  Clearly mark and make sure that you can see:  Any grab bars or handrails.  First and last steps.  Where the edge of each step is.  Use tools that help you move around (mobility aids) if they are needed. These include:  Canes.  Walkers.  Scooters.  Crutches.  Turn on the lights when you go into a dark area. Replace any light bulbs as soon as they burn out.  Set up your furniture so you have a clear path. Avoid moving your furniture around.  If any of your floors are uneven, fix them.  If there are any pets around you, be aware of where they are.  Review your medicines with your doctor. Some medicines can make you feel dizzy. This can increase your chance of falling. Ask your doctor what other things that you can do to help prevent falls.   This information is not intended to replace advice given to you by your health care provider. Make sure you discuss any questions you have with your health care provider.   Document Released: 01/17/2009 Document Revised: 08/07/2014 Document Reviewed: 04/27/2014 Elsevier Interactive Patient Education 2016 [place] Maintenance, Female Adopting a healthy lifestyle and getting preventive care can go a long way to promote health and wellness. Talk with your health care provider about  what schedule of regular examinations is right for you. This is a good chance for you to check in with your provider about disease prevention and staying healthy. In between checkups, there are plenty of things you can do on your own. Experts have done a lot of research  about which lifestyle changes and preventive measures are most likely to keep you healthy. Ask your health care provider for more information. WEIGHT AND DIET  Eat a healthy diet  Be sure to include plenty of vegetables, fruits, low-fat dairy products, and lean protein.  Do not eat a lot of foods high in solid fats, added sugars, or salt.  Get regular exercise. This is one of the most important things you can do for your health.  Most adults should exercise for at least 150 minutes each week. The exercise should increase your heart rate and make you sweat (moderate-intensity exercise).  Most adults should also do strengthening exercises at least twice a week. This is in addition to the moderate-intensity exercise.  Maintain a healthy weight  Body mass index (BMI) is a measurement that can be used to identify possible weight problems. It estimates body fat based on height and weight. Your health care provider can help determine your BMI and help you achieve or maintain a healthy weight.  For females 15 years of age and older:   A BMI below 18.5 is considered underweight.  A BMI of 18.5 to 24.9 is normal.  A BMI of 25 to 29.9 is considered overweight.  A BMI of 30 and above is considered obese.  Watch levels of cholesterol and blood lipids  You should start having your blood tested for lipids and cholesterol at 76 years of age, then have this test every 5 years.  You may need to have your cholesterol levels checked more often if:  Your lipid or cholesterol levels are high.  You are older than 76 years of age.  You are at high risk for heart disease.  CANCER SCREENING   Lung Cancer  Lung cancer screening is  recommended for adults 27-77 years old who are at high risk for lung cancer because of a history of smoking.  A yearly low-dose CT scan of the lungs is recommended for people who:  Currently smoke.  Have quit within the past 15 years.  Have at least a 30-pack-year history of smoking. A pack year is smoking an average of one pack of cigarettes a day for 1 year.  Yearly screening should continue until it has been 15 years since you quit.  Yearly screening should stop if you develop a health problem that would prevent you from having lung cancer treatment.  Breast Cancer  Practice breast self-awareness. This means understanding how your breasts normally appear and feel.  It also means doing regular breast self-exams. Let your health care provider know about any changes, no matter how small.  If you are in your 20s or 30s, you should have a clinical breast exam (CBE) by a health care provider every 1-3 years as part of a regular health exam.  If you are 94 or older, have a CBE every year. Also consider having a breast X-ray (mammogram) every year.  If you have a family history of breast cancer, talk to your health care provider about genetic screening.  If you are at high risk for breast cancer, talk to your health care provider about having an MRI and a mammogram every year.  Breast cancer gene (BRCA) assessment is recommended for women who have family members with BRCA-related cancers. BRCA-related cancers include:  Breast.  Ovarian.  Tubal.  Peritoneal cancers.  Results of the assessment will determine the need for genetic counseling and BRCA1 and BRCA2 testing. Cervical Cancer Your health care provider may recommend that  you be screened regularly for cancer of the pelvic organs (ovaries, uterus, and vagina). This screening involves a pelvic examination, including checking for microscopic changes to the surface of your cervix (Pap test). You may be encouraged to have this  screening done every 3 years, beginning at age 55.  For women ages 31-65, health care providers may recommend pelvic exams and Pap testing every 3 years, or they may recommend the Pap and pelvic exam, combined with testing for human papilloma virus (HPV), every 5 years. Some types of HPV increase your risk of cervical cancer. Testing for HPV may also be done on women of any age with unclear Pap test results.  Other health care providers may not recommend any screening for nonpregnant women who are considered low risk for pelvic cancer and who do not have symptoms. Ask your health care provider if a screening pelvic exam is right for you.  If you have had past treatment for cervical cancer or a condition that could lead to cancer, you need Pap tests and screening for cancer for at least 20 years after your treatment. If Pap tests have been discontinued, your risk factors (such as having a new sexual partner) need to be reassessed to determine if screening should resume. Some women have medical problems that increase the chance of getting cervical cancer. In these cases, your health care provider may recommend more frequent screening and Pap tests. Colorectal Cancer  This type of cancer can be detected and often prevented.  Routine colorectal cancer screening usually begins at 76 years of age and continues through 76 years of age.  Your health care provider may recommend screening at an earlier age if you have risk factors for colon cancer.  Your health care provider may also recommend using home test kits to check for hidden blood in the stool.  A small camera at the end of a tube can be used to examine your colon directly (sigmoidoscopy or colonoscopy). This is done to check for the earliest forms of colorectal cancer.  Routine screening usually begins at age 48.  Direct examination of the colon should be repeated every 5-10 years through 76 years of age. However, you may need to be screened  more often if early forms of precancerous polyps or small growths are found. Skin Cancer  Check your skin from head to toe regularly.  Tell your health care provider about any new moles or changes in moles, especially if there is a change in a mole's shape or color.  Also tell your health care provider if you have a mole that is larger than the size of a pencil eraser.  Always use sunscreen. Apply sunscreen liberally and repeatedly throughout the day.  Protect yourself by wearing long sleeves, pants, a wide-brimmed hat, and sunglasses whenever you are outside. HEART DISEASE, DIABETES, AND HIGH BLOOD PRESSURE   High blood pressure causes heart disease and increases the risk of stroke. High blood pressure is more likely to develop in:  People who have blood pressure in the high end of the normal range (130-139/85-89 mm Hg).  People who are overweight or obese.  People who are African American.  If you are 41-30 years of age, have your blood pressure checked every 3-5 years. If you are 52 years of age or older, have your blood pressure checked every year. You should have your blood pressure measured twice--once when you are at a hospital or clinic, and once when you are not at a hospital  or clinic. Record the average of the two measurements. To check your blood pressure when you are not at a hospital or clinic, you can use:  An automated blood pressure machine at a pharmacy.  A home blood pressure monitor.  If you are between 64 years and 74 years old, ask your health care provider if you should take aspirin to prevent strokes.  Have regular diabetes screenings. This involves taking a blood sample to check your fasting blood sugar level.  If you are at a normal weight and have a low risk for diabetes, have this test once every three years after 76 years of age.  If you are overweight and have a high risk for diabetes, consider being tested at a younger age or more often. PREVENTING  INFECTION  Hepatitis B  If you have a higher risk for hepatitis B, you should be screened for this virus. You are considered at high risk for hepatitis B if:  You were born in a country where hepatitis B is common. Ask your health care provider which countries are considered high risk.  Your parents were born in a high-risk country, and you have not been immunized against hepatitis B (hepatitis B vaccine).  You have HIV or AIDS.  You use needles to inject street drugs.  You live with someone who has hepatitis B.  You have had sex with someone who has hepatitis B.  You get hemodialysis treatment.  You take certain medicines for conditions, including cancer, organ transplantation, and autoimmune conditions. Hepatitis C  Blood testing is recommended for:  Everyone born from [birth year] through 1966.  Anyone with known risk factors for hepatitis C. Sexually transmitted infections (STIs)  You should be screened for sexually transmitted infections (STIs) including gonorrhea and chlamydia if:  You are sexually active and are younger than 76 years of age.  You are older than 76 years of age and your health care provider tells you that you are at risk for this type of infection.  Your sexual activity has changed since you were last screened and you are at an increased risk for chlamydia or gonorrhea. Ask your health care provider if you are at risk.  If you do not have HIV, but are at risk, it may be recommended that you take a prescription medicine daily to prevent HIV infection. This is called pre-exposure prophylaxis (PrEP). You are considered at risk if:  You are sexually active and do not regularly use condoms or know the HIV status of your partner(s).  You take drugs by injection.  You are sexually active with a partner who has HIV. Talk with your health care provider about whether you are at high risk of being infected with HIV. If you choose to begin PrEP, you should first be  tested for HIV. You should then be tested every 3 months for as long as you are taking PrEP.  PREGNANCY   If you are premenopausal and you may become pregnant, ask your health care provider about preconception counseling.  If you may become pregnant, take 400 to 800 micrograms (mcg) of folic acid every day.  If you want to prevent pregnancy, talk to your health care provider about birth control (contraception). OSTEOPOROSIS AND MENOPAUSE   Osteoporosis is a disease in which the bones lose minerals and strength with aging. This can result in serious bone fractures. Your risk for osteoporosis can be identified using a bone density scan.  If you are 20 years of age or  older, or if you are at risk for osteoporosis and fractures, ask your health care provider if you should be screened.  Ask your health care provider whether you should take a calcium or vitamin D supplement to lower your risk for osteoporosis.  Menopause may have certain physical symptoms and risks.  Hormone replacement therapy may reduce some of these symptoms and risks. Talk to your health care provider about whether hormone replacement therapy is right for you.  HOME CARE INSTRUCTIONS   Schedule regular health, dental, and eye exams.  Stay current with your immunizations.   Do not use any tobacco products including cigarettes, chewing tobacco, or electronic cigarettes.  If you are pregnant, do not drink alcohol.  If you are breastfeeding, limit how much and how often you drink alcohol.  Limit alcohol intake to no more than 1 drink per day for nonpregnant women. One drink equals 12 ounces of beer, 5 ounces of wine, or 1 ounces of hard liquor.  Do not use street drugs.  Do not share needles.  Ask your health care provider for help if you need support or information about quitting drugs.  Tell your health care provider if you often feel depressed.  Tell your health care provider if you have ever been abused or  do not feel safe at home.   This information is not intended to replace advice given to you by your health care provider. Make sure you discuss any questions you have with your health care provider.   Document Released: 10/06/2010 Document Revised: 04/13/2014 Document Reviewed: 02/22/2013 Elsevier Interactive Patient Education [employer].

## 2015-05-31 NOTE — Progress Notes (Signed)
Subjective:   Robin Snow is a 75 y.o. female who presents for Medicare Annual (Subsequent) preventive examination.  Review of Systems:  HRA assessment completed during visit; Robin Snow The Patient was informed that this wellness visit is to identify risk and educate on how to reduce risk for increase disease through lifestyle changes.   ROS deferred to CPE exam with physician  Medical issues  HTN; BP good today DM2 A1c 5.6 checks bs in the am but now off of amaryl per Robin Snow;  Hyperlipidemia (04/2015) cho 129; Trig 129; HDL 33; LDL 69;  States he had stroke in 2015;    BMI: 39.9;  Diet; gets up around 6;45; has grandson that she gets off to school Up at 10;30 and bowl of cereal and coffee; juice; boiled egg Cleans up some; Lunch; eats a sandwich; can of soup Supper; vegetables; greens; cabbage; has a meat for dinner  Snacks peanut butter and jelly cracker;  Dtr states she eats well at supper  ADL's can cook, but doesn't trust herself around the stove; afraid of falling; does have walker with a seat in it  She fell x 1 recently washing clothes; dtr was there; no injury; States she turned around to quickly.  She gives herself a bath and uses shower chair; needs help with back  Exercise; exercises in chair;  Getting up and down is exercise and takes time due to knees secondary to OA  SAFETY; dtr and other family members live with the patient;  Lots of support Dtr and grand-son;  8 grand-children  Safety reviewed for the home; multi-level but patient stays downstairs;  Removal of clutter clearing paths through the home, hardwood floors; one rug but is "nailed" down Bathroom safety; has chair in tub; someone is around when bathing Community safety? neighborhood not as safe but again; family around; Doesn't sit out on porch as she did  Smoke detectors yes Firearms safety / no firearms;  Driving accidents and seatbelt/ does not drive Sun protection/ maybe in  the summer;  Stressors; not at this time  Medication review/ New meds Reviewed meds; takes her own meds  Fall assessment x 1 doing laundry Gait assessment walks with can and has walker and w/c at home.  Mobilization and Functional losses in the last year./ States she can't move as quickly Sleep is adequate  Urinary or fecal incontinence reviewed/ no issues  Counseling: Foot exam Colonoscopy; 07/2013 repeat in 5 years depending on health EKG: 12/2014 Mammogram 01/2013 neg Dexa 03/2007 Normal Hearing: '4000hz'$  both ears Ophthalmology exam; Robin Snow; to make an apt Immunizations; Tetanus; Zostavax discussed and did not have the chicken pox / will take shingles override shingles vaccine  PCV 13: Thinks she had when she was in the hospital; does not want to take today since she had the flu x 1 week ago but will come in 3 to 4 weeks and take this;    Current Care Team reviewed and updated   Cardiac Risk Factors include: advanced age (>88mn, >>82women);dyslipidemia;hypertension;obesity (BMI >30kg/m2);sedentary lifestyle     Objective:     Vitals: BP 130/80 mmHg  Pulse 75  Ht '5\' 7"'$  (1.702 m)  Wt 255 lb (115.667 kg)  BMI 39.93 kg/m2  SpO2 98%  Tobacco History  Smoking status  . Former Smoker  . Types: Cigarettes  . Quit date: 07/07/1993  Smokeless tobacco  . Never Used    Comment: .3 cig a day     Counseling given: Yes  Past Medical History  Diagnosis Date  . Atrial fibrillation ([place])   . Hypertension   . Arthritis   . Coagulopathy ([place])   . Diabetes mellitus     Type 2  . GI bleed   . Pneumonia     2-3 years ago  . GERD (gastroesophageal reflux disease)   . Anemia   . Hx of transfusion of packed red blood cells   . Diverticulosis   . Anticoagulant drug declined     due to hx GI bleeding  . Stroke [facility])    Past Surgical History  Procedure Laterality Date  . Cesarean section    . Replacement total knee bilateral    . Tubal ligation    .  Esophagogastroduodenoscopy Left 01/23/2013    Procedure: ESOPHAGOGASTRODUODENOSCOPY (EGD);  Surgeon: Arta Silence, MD;  [facility] END[facility]COPY;  Service: Endoscopy;  Laterality: Left;  . Colonoscopy Left 01/24/2013    Procedure: COLONOSCOPY;  Surgeon: Arta Silence, MD;  Location: [facility] ENDOSCOPY;  Service: Endoscopy;  Laterality: Left;  [name] Eye surgery Bilateral     cataract surgery  . Joint replacement     Family History  Problem Relation Age of Onset  . Cancer Father   . Stroke Maternal Aunt    History  Sexual Activity  . Sexual Activity: Not on file    Outpatient Encounter Prescriptions as of 05/31/2015  Medication Sig  . ACCU-CHEK SMARTVIEW test strip TEST TWO TIMES DAILY AS DIRECTED  . amoxicillin-clavulanate (AUGMENTIN) 875-125 MG tablet Take 1 tablet by mouth 2 (two) times daily.  [name] apixaban (ELIQUIS) 5 MG TABS tablet Take 1 tablet (5 mg total) by mouth 2 (two) times daily.  . Blood Glucose Monitoring Suppl (ACCU-CHEK NANO SMARTVIEW) w/Device KIT USE TWICE DAILY AS INSTRUCTED  . diltiazem (CARDIZEM CD) 180 MG 24 hr capsule Take 1 capsule (180 mg total) by mouth daily.  [name] docusate sodium (COLACE) 100 MG capsule TAKE ONE CAPSULE BY MOUTH DAILY  . furosemide (LASIX) 40 MG tablet Take 1 tablet (40 mg total) by mouth daily as needed for fluid.  [name] gabapentin (NEURONTIN) 100 MG capsule Take 1 capsule (100 mg total) by mouth at bedtime.  . gabapentin (NEURONTIN) 300 MG capsule Take 1 capsule (300 mg total) by mouth 3 (three) times daily. TAKE 1 CAPSULE (300 MG TOTAL) BY MOUTH 3 TIMES DAILY.  [name] glimepiride (AMARYL) 2 MG tablet TAKE 1 TABLET (2 MG TOTAL) BY MOUTH DAILY BEFORE BREAKFAST.  . hydrochlorothiazide (MICROZIDE) 12.5 MG capsule Take 1 capsule (12.5 mg total) by mouth daily.  [name] lisinopril (PRINIVIL,ZESTRIL) 40 MG tablet Take 1 tablet (40 mg total) by mouth daily.  [name] lovastatin (MEVACOR) 20 MG tablet Take 1 tablet (20 mg total) by mouth at bedtime.  . metoprolol tartrate  (LOPRESSOR) 25 MG tablet Take 0.5 tablets (12.5 mg total) by mouth 2 (two) times daily.  [name] oxybutynin (DITROPAN) 5 MG tablet TAKE 1 TABLET BY MOUTH DAILY  . potassium chloride (KLOR-CON) 20 MEQ packet Take 20 mEq by mouth daily.  . promethazine-dextromethorphan (PROMETHAZINE-DM) 6.25-15 MG/5ML syrup Take 5 mLs by mouth 4 (four) times daily as needed for cough.   No facility-administered encounter medications on file as of 05/31/2015.    Activities of Daily Living In your present state of health, do you have any difficulty performing the following activities: 05/31/2015 07/20/2014  Hearing? N N  Vision? N N  Difficulty concentrating or making decisions? N N  Walking or climbing stairs? Y Y  Dressing or bathing?  N Y  Doing errands, shopping? N Y  Conservation officer, nature and eating ? N -  Using the Toilet? N -  In the past six months, have you accidently leaked urine? N -  Do you have problems with loss of bowel control? N -  Managing your Medications? N -  Managing your Finances? N -  Housekeeping or managing your Housekeeping? N -    Patient Care Team: [name], MD as PCP - General (Internal Medicine)    Assessment:    Assessment   Patient presents for yearly preventative medicine examination. Medicare questionnaire screening were completed, i.e. Functional; fall risk; depression, memory loss and hearing were unremarkable; Dtr stated she manages fiances well;   All immunizations and health maintenance protocols were reviewed and discussed with the patient; Made a plan to get prevnar  Education provided for laboratory screens;    Medication reconciliation, past medical history, social history, problem list and allergies were reviewed in detail with the patient  Goals were established with regard to exercise as main risk is immobility; Agrees to go to Y or start going to pool or class;   End of life planning was discussed and has been completed   Exercise Activities and  Dietary recommendations Current Exercise Habits:: Structured exercise class, Time (Minutes): 40, Frequency (Times/Week): 2, Weekly Exercise (Minutes/Week): 80, Intensity: Moderate  Goals    . patient     Wants to be independent; Keep moving;  [employer] classes or Y / can get pool for water aerobics      Fall Risk Fall Risk  05/31/2015 03/22/2015 03/15/2015 07/20/2014  Falls in the past year? Yes No No No  Risk for fall due to : Impaired mobility;Impaired balance/gait - - -   Depression Screen PHQ 2/9 Scores 05/31/2015 03/15/2015  PHQ - 2 Score 0 0     Cognitive Testing No flowsheet data found.   Ad8 score 0 and dtr agreed there are no issues  Immunization History  Administered Date(s) Administered  . Influenza, High Dose Seasonal PF 05/25/2015  . Influenza,inj,Quad PF,36+ Mos 06/01/2013   Screening Tests Health Maintenance  Topic Date Due  . FOOT EXAM  09/01/1949  . OPHTHALMOLOGY EXAM  09/01/1949  . TETANUS/TDAP  09/02/1958  . ZOSTAVAX  09/02/1999  . PNA vac Low Risk Adult (1 of 2 - PCV13) 09/01/2004  . HEMOGLOBIN A1C  10/24/2015  . INFLUENZA VACCINE  11/05/2015  . COLONOSCOPY  07/19/2023  . DEXA SCAN  Completed      Plan:   Had flu shot x 1 week ago; came to clinic because she was sick;  Will come in 3 to 4 weeks when feeling better to take pneumonia vaccine; (prevnar 13 )   Never had the chicken pox;   Will schedule eye exam soon    During the course of the visit the patient was educated and counseled about the following appropriate screening and preventive services:   Vaccines to include Pneumoccal, Influenza, Hepatitis B, Td, Zostavax, HCV/ to take prevnar 3 to 4 weeks/   Electrocardiogram/ 12/2014  Cardiovascular Disease/ BP good today  Colorectal cancer screening/ aged out  Bone density screening normal  Diabetes screening/ off med for DM and is very happy about this; checking BS every am  Glaucoma screening/ to schedule eye exam  soon  Mammography/2014 /   Nutrition counseling / reviewed; agreed to exercise   Patient Instructions (the written plan) was given to the patient.   Wynetta Fines, RN  05/31/2015

## 2015-06-03 NOTE — Progress Notes (Signed)
Medical screening examination/treatment/procedure(s) were performed by non-physician practitioner and as supervising physician I was immediately available for consultation/collaboration. I agree with above. Cella Cappello A Harutyun Monteverde, MD 

## 2015-06-19 ENCOUNTER — Other Ambulatory Visit: Payer: Self-pay | Admitting: Internal Medicine

## 2015-06-26 DIAGNOSIS — I4891 Unspecified atrial fibrillation: Secondary | ICD-10-CM | POA: Diagnosis not present

## 2015-06-26 DIAGNOSIS — Z5181 Encounter for therapeutic drug level monitoring: Secondary | ICD-10-CM | POA: Diagnosis not present

## 2015-06-26 DIAGNOSIS — Z7901 Long term (current) use of anticoagulants: Secondary | ICD-10-CM | POA: Diagnosis not present

## 2015-06-26 DIAGNOSIS — Z7984 Long term (current) use of oral hypoglycemic drugs: Secondary | ICD-10-CM | POA: Diagnosis not present

## 2015-06-26 DIAGNOSIS — Z9689 Presence of other specified functional implants: Secondary | ICD-10-CM | POA: Diagnosis not present

## 2015-06-26 DIAGNOSIS — G894 Chronic pain syndrome: Secondary | ICD-10-CM | POA: Diagnosis not present

## 2015-06-26 DIAGNOSIS — I10 Essential (primary) hypertension: Secondary | ICD-10-CM | POA: Diagnosis not present

## 2015-06-26 DIAGNOSIS — Z79899 Other long term (current) drug therapy: Secondary | ICD-10-CM | POA: Diagnosis not present

## 2015-06-26 DIAGNOSIS — E119 Type 2 diabetes mellitus without complications: Secondary | ICD-10-CM | POA: Diagnosis not present

## 2015-06-26 DIAGNOSIS — G8929 Other chronic pain: Secondary | ICD-10-CM | POA: Diagnosis not present

## 2015-06-26 DIAGNOSIS — M25561 Pain in right knee: Secondary | ICD-10-CM | POA: Diagnosis not present

## 2015-08-13 DIAGNOSIS — Z96659 Presence of unspecified artificial knee joint: Secondary | ICD-10-CM | POA: Diagnosis not present

## 2015-08-13 DIAGNOSIS — M25561 Pain in right knee: Secondary | ICD-10-CM | POA: Diagnosis not present

## 2015-08-13 DIAGNOSIS — G894 Chronic pain syndrome: Secondary | ICD-10-CM | POA: Diagnosis not present

## 2015-08-13 DIAGNOSIS — G8929 Other chronic pain: Secondary | ICD-10-CM | POA: Diagnosis not present

## 2015-09-16 ENCOUNTER — Other Ambulatory Visit: Payer: Self-pay | Admitting: Cardiovascular Disease

## 2015-09-17 DIAGNOSIS — G894 Chronic pain syndrome: Secondary | ICD-10-CM | POA: Diagnosis not present

## 2015-09-17 DIAGNOSIS — M25561 Pain in right knee: Secondary | ICD-10-CM | POA: Diagnosis not present

## 2015-09-17 DIAGNOSIS — G8929 Other chronic pain: Secondary | ICD-10-CM | POA: Diagnosis not present

## 2015-10-01 ENCOUNTER — Other Ambulatory Visit: Payer: Self-pay | Admitting: Internal Medicine

## 2015-10-14 ENCOUNTER — Other Ambulatory Visit: Payer: Self-pay | Admitting: Internal Medicine

## 2015-10-22 ENCOUNTER — Ambulatory Visit (INDEPENDENT_AMBULATORY_CARE_PROVIDER_SITE_OTHER): Payer: Commercial Managed Care - HMO | Admitting: Internal Medicine

## 2015-10-22 ENCOUNTER — Other Ambulatory Visit (INDEPENDENT_AMBULATORY_CARE_PROVIDER_SITE_OTHER): Payer: Commercial Managed Care - HMO

## 2015-10-22 ENCOUNTER — Encounter: Payer: Self-pay | Admitting: Internal Medicine

## 2015-10-22 VITALS — BP 140/88 | HR 90 | Temp 98.8°F | Resp 18 | Ht 67.0 in | Wt 260.0 lb

## 2015-10-22 DIAGNOSIS — E1140 Type 2 diabetes mellitus with diabetic neuropathy, unspecified: Secondary | ICD-10-CM | POA: Diagnosis not present

## 2015-10-22 DIAGNOSIS — G44219 Episodic tension-type headache, not intractable: Secondary | ICD-10-CM

## 2015-10-22 LAB — HEMOGLOBIN A1C: HEMOGLOBIN A1C: 5.9 % (ref 4.6–6.5)

## 2015-10-22 LAB — SEDIMENTATION RATE: SED RATE: 39 mm/h — AB (ref 0–30)

## 2015-10-22 LAB — COMPREHENSIVE METABOLIC PANEL
ALBUMIN: 3.8 g/dL (ref 3.5–5.2)
ALK PHOS: 90 U/L (ref 39–117)
ALT: 8 U/L (ref 0–35)
AST: 13 U/L (ref 0–37)
BILIRUBIN TOTAL: 0.7 mg/dL (ref 0.2–1.2)
BUN: 15 mg/dL (ref 6–23)
CALCIUM: 9.3 mg/dL (ref 8.4–10.5)
CO2: 30 mEq/L (ref 19–32)
Chloride: 103 mEq/L (ref 96–112)
Creatinine, Ser: 1.05 mg/dL (ref 0.40–1.20)
GFR: 65.51 mL/min (ref >60.00)
GLUCOSE: 118 mg/dL — AB (ref 70–99)
Potassium: 3.8 mEq/L (ref 3.5–5.1)
Sodium: 139 mEq/L (ref 135–145)
TOTAL PROTEIN: 7.9 g/dL (ref 6.0–8.3)

## 2015-10-22 MED ORDER — FUROSEMIDE 80 MG PO TABS: 80.0000 mg | ORAL_TABLET | Freq: Every day | ORAL | Status: DC | PRN

## 2015-10-22 NOTE — Progress Notes (Signed)
Pre visit review using our clinic review tool, if applicable. No additional management support is needed unless otherwise documented below in the visit note. 

## 2015-10-22 NOTE — Patient Instructions (Signed)
We have sent in the 80 mg lasix to take when needed.   We will check the labs today and call you back with the results.   It is okay to take tylenol for the headaches and you can try lavender oil to help with stress and headaches as well.

## 2015-10-22 NOTE — Progress Notes (Signed)
   Subjective:    Patient ID: Robin Snow, female    DOB: 1940-06-19, 75 y.o.   MRN: [id number]  HPI The patient is a 75 YO female coming in for headaches daily. 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 She sometimes wakes up with them and then sometimes they come during the day. She is typically getting some precursor pain in her neck which moves into her head. She is taking some tylenol for them and they get better. She is sleeping well and does not wake up at night.   Review of Systems  Constitutional: Negative for fever, activity change, appetite change, fatigue and unexpected weight change.  Eyes: Negative.   Respiratory: Negative for cough, chest tightness, shortness of breath and wheezing.   Cardiovascular: Negative for chest pain and palpitations.  Gastrointestinal: Negative.   Musculoskeletal: Positive for arthralgias. Negative for gait problem.  Skin: Negative.   Neurological: Positive for headaches. Negative for dizziness and weakness.      Objective:   Physical Exam  Constitutional: She is oriented to person, place, and time. She appears well-developed and well-nourished.  Obese  HENT:  Head: Normocephalic and atraumatic.  No temporal tenderness left or right  Eyes: EOM are normal.  Neck: Normal range of motion.  Cardiovascular: Normal rate.   Irregularly irregular  Pulmonary/Chest: Effort normal and breath sounds normal. No respiratory distress. She has no wheezes. She has no rales.  Abdominal: Soft. Bowel sounds are normal. She exhibits no distension. There is no tenderness.  Neurological: She is alert and oriented to person, place, and time. Coordination normal.  Skin: Skin is warm and dry. No rash noted.   Filed Vitals:   10/22/15 1619  BP: 158/90  Pulse: 90  Temp: 98.8 F (37.1 C)  TempSrc: Oral  Resp: 18  Height: 5\' 7"  (1.702 m)  Weight: 260 lb (117.935 kg)  SpO2: 98%      Assessment & Plan:

## 2015-10-23 ENCOUNTER — Other Ambulatory Visit: Payer: Self-pay | Admitting: Geriatric Medicine

## 2015-10-23 ENCOUNTER — Telehealth: Payer: Self-pay | Admitting: Internal Medicine

## 2015-10-23 MED ORDER — FUROSEMIDE 80 MG PO TABS: 80.0000 mg | ORAL_TABLET | Freq: Every day | ORAL | Status: DC | PRN

## 2015-10-23 NOTE — Telephone Encounter (Signed)
Pt called and said that the [facility] does not have her furosemide (LASIX) 80 MG tablet [[id number]][[id number]], can this be sent again.  She just called them an hour ago

## 2015-10-23 NOTE — Telephone Encounter (Signed)
Sent to pharmacy 

## 2015-10-24 DIAGNOSIS — R519 Headache, unspecified: Secondary | ICD-10-CM | POA: Insufficient documentation

## 2015-10-24 DIAGNOSIS — R51 Headache: Secondary | ICD-10-CM

## 2015-10-24 NOTE — Assessment & Plan Note (Signed)
Checking HgA1c to follow up and adjust as needed. She is off all medications at this time. Diet is a little worse this summer but overall weight is stable.

## 2015-10-24 NOTE — Assessment & Plan Note (Signed)
Suspect tension headaches and we talked about medication rebound and to limit tylenol if able. Checking ESR however no temporal tenderness on exam and history is inconsistent with temporal arteritis.

## 2015-12-03 ENCOUNTER — Other Ambulatory Visit: Payer: Self-pay | Admitting: Cardiovascular Disease

## 2015-12-10 DIAGNOSIS — M1711 Unilateral primary osteoarthritis, right knee: Secondary | ICD-10-CM | POA: Diagnosis not present

## 2016-01-20 ENCOUNTER — Telehealth: Payer: Self-pay | Admitting: Internal Medicine

## 2016-01-20 ENCOUNTER — Other Ambulatory Visit: Payer: Self-pay | Admitting: Internal Medicine

## 2016-01-20 NOTE — Telephone Encounter (Signed)
Patient states eliquis is no longer going to be covered under her insurance.  Wants to know if Dr. Okey Duprerawford has samples.  Would like to know if there is something else she can take.

## 2016-01-20 NOTE — Telephone Encounter (Signed)
Have her call and find out if xarelto, dabigatran are covered. We do not keep samples of this medicine. She could also go on the website of the eliquis to see if she qualifies for assistance for this medication.

## 2016-01-21 NOTE — Telephone Encounter (Signed)
Pt called and asked that you give her a call back about the patient taking Eliquis. Please advise thanks.

## 2016-01-22 ENCOUNTER — Other Ambulatory Visit: Payer: Self-pay | Admitting: Geriatric Medicine

## 2016-01-22 NOTE — Telephone Encounter (Signed)
Patient is out of eliquis and she cannot afford it. She is wondering if there is anything else that she can take that can be sent in that might be cheaper. Please advise in Dr. Frutoso Chaserawford's absence. Thanks.

## 2016-01-22 NOTE — Telephone Encounter (Signed)
Please follow up with patient. She is not understanding what is going on with her medications and what she should be taking. [phone] Please talk to Shanda BumpsJessica her daughter she states. So she can understand. Thank you.

## 2016-01-23 NOTE — Telephone Encounter (Signed)
Sample of Eliquis provided. If unable to obtain Eliquis or Xarelto may require coumadin.

## 2016-01-23 NOTE — Telephone Encounter (Signed)
Patient aware and will come pick up 

## 2016-01-31 ENCOUNTER — Ambulatory Visit: Payer: Commercial Managed Care - HMO | Admitting: Cardiovascular Disease

## 2016-01-31 NOTE — Progress Notes (Deleted)
No chief complaint on file.     History of Present Illness: 75 yo female with history of DM, HTN, hyperlipidemia, atrial fibrillation, obesity and COPD here today for cardiac follow up. She was seen in my office back in 2012 after being admitted to [facility] 05/02/10 with CP. She ruled out for an MI with serial cardiac enzymes. Her echo showed normal LV size with moderate LVH and normal LV systolic function. Stress myoview without ischemia. CT angiogram without evidence of PE. Prior cardiac cath 2001 with no evidence of CAD. Admitted to [facility] August 2015 with a TIA and was started on Eliquis. Of note, her coumadin had been stopped in 2015 due to GI bleeding. She was seen in our office November 2015 and doing well on Eliquis with rate control with Diltiazem and metoprolol. Echo August 2015 with normal LV size and function. Spinal stimulator placed for back pain.   She is doing well today. No chest pain or SOB. No awareness of palpitations. No bleeding on Eliquis.   Primary Care Physician: [name], MD  Past Medical History:  Diagnosis Date  . Anemia   . Anticoagulant drug declined    due to hx GI bleeding  . Arthritis   . Atrial fibrillation ([place])   . Coagulopathy ([place])   . Diabetes mellitus    Type 2  . Diverticulosis   . GERD (gastroesophageal reflux disease)   . GI bleed   . Hx of transfusion of packed red blood cells   . Hypertension   . Pneumonia    2-3 years ago  . Stroke [facility])     Past Surgical History:  Procedure Laterality Date  . CESAREAN SECTION    . COLONOSCOPY Left 01/24/2013   Procedure: COLONOSCOPY;  Surgeon: Arta Silence, MD;  Location: [facility] ENDOSCOPY;  Service: Endoscopy;  Laterality: Left;  . ESOPHAGOGASTRODUODENOSCOPY Left 01/23/2013   Procedure: ESOPHAGOGASTRODUODENOSCOPY (EGD);  Surgeon: Arta Silence, MD;  Location: [facility] ENDOSCOPY;  Service: Endoscopy;  Laterality: Left;  . EYE SURGERY Bilateral    cataract surgery  . JOINT  REPLACEMENT    . REPLACEMENT TOTAL KNEE BILATERAL    . TUBAL LIGATION      Current Outpatient Prescriptions  Medication Sig Dispense Refill  . ACCU-CHEK SMARTVIEW test strip TEST TWO TIMES DAILY AS DIRECTED (Patient not taking: Reported on 10/22/2015) 200 each 5  . apixaban (ELIQUIS) 5 MG TABS tablet Take 1 tablet (5 mg total) by mouth 2 (two) times daily. 180 tablet 3  . Blood Glucose Monitoring Suppl (ACCU-CHEK NANO SMARTVIEW) w/Device KIT USE TWICE DAILY AS INSTRUCTED (Patient not taking: Reported on 10/22/2015) 1 kit 0  . CARTIA XT 180 MG 24 hr capsule TAKE 1 CAPSULE EVERY DAY 90 capsule 3  . docusate sodium (COLACE) 100 MG capsule TAKE ONE CAPSULE BY MOUTH DAILY 30 capsule 0  . ELIQUIS 5 MG TABS tablet TAKE ONE TABLET BY MOUTH TWICE DAILY 14 tablet 0  . furosemide (LASIX) 80 MG tablet Take 1 tablet (80 mg total) by mouth daily as needed for fluid. 30 tablet 3  . gabapentin (NEURONTIN) 100 MG capsule TAKE 1 CAPSULE EVERY NIGHT AT BEDTIME 90 capsule 3  . gabapentin (NEURONTIN) 300 MG capsule TAKE 1 CAPSULE THREE TIMES DAILY 270 capsule 3  . hydrochlorothiazide (MICROZIDE) 12.5 MG capsule TAKE 1 CAPSULE (12.5 MG TOTAL) BY MOUTH DAILY. 90 capsule 3  . lisinopril (PRINIVIL,ZESTRIL) 40 MG tablet Take 1 tablet (40 mg total) by mouth daily. Please keep scheduled  appt on 01/31/16 _0 :30 pm for further refills. 90 tablet 0  . lovastatin (MEVACOR) 20 MG tablet TAKE 1 TABLET EVERY NIGHT AT BEDTIME 90 tablet 2  . metoprolol tartrate (LOPRESSOR) 25 MG tablet TAKE 1/2 TABLET TWICE DAILY 90 tablet 3  . oxybutynin (DITROPAN) 5 MG tablet TAKE 1 TABLET EVERY DAY 90 tablet 3  . potassium chloride (KLOR-CON) 20 MEQ packet Take 20 mEq by mouth daily.    . promethazine-dextromethorphan (PROMETHAZINE-DM) 6.25-15 MG/5ML syrup Take 5 mLs by mouth 4 (four) times daily as needed for cough. (Patient not taking: Reported on 10/22/2015) 118 mL 0   No current facility-administered medications for this visit.      Allergies  Allergen Reactions  . Aspirin Hives  . Hydrocodone Hives    Tolerates oxycodone and tramadol  . Rofecoxib Hives    Social History   Social History  . Marital status: Widowed    Spouse name: [name]  . Number of children: 4  . Years of education: 12   Occupational History  . Not on file.   Social History Main Topics  . Smoking status: Former Smoker    Types: Cigarettes    Quit date: 07/07/1993  . Smokeless tobacco: Never Used     Comment: .3 cig a day  . Alcohol use No     Comment: no longer drinks alcohol  . Drug use: No  . Sexual activity: Not on file   Other Topics Concern  . Not on file   Social History Narrative   Patient is widowed and has 4 living children, and 2 deceased children.   Patient is right handed.   Patient has hs education.   Patient drinks coffee and soda once a week.    Family History  Problem Relation Age of Onset  . Cancer Father   . Stroke Maternal Aunt     Review of Systems:  As stated in the HPI and otherwise negative.   There were no vitals taken for this visit.  Physical Examination: General: Well developed, well nourished, NAD  HEENT: OP clear, mucus membranes moist  SKIN: warm, dry. No rashes. Neuro: No focal deficits  Musculoskeletal: Muscle strength 5/5 all ext  Psychiatric: Mood and affect normal  Neck: No JVD, no carotid bruits, no thyromegaly, no lymphadenopathy.  Lungs:Clear bilaterally, no wheezes, rhonci, crackles Cardiovascular: Regular rate and rhythm. No murmurs, gallops or rubs. Abdomen:Soft. Bowel sounds present. Non-tender.  Extremities: No lower extremity edema. Pulses are 2 + in the bilateral DP/PT.  Echo August 2015  - Left ventricle: The cavity size was normal. Wall thickness was increased in a pattern of mild LVH. Systolic function was normal. The estimated ejection fraction was in the range of 60% to 65%. Wall motion was normal; there were no regional wall motion abnormalities.  Indeterminant diastolic function (atrial fibrillation). - Aortic valve: There was no stenosis. - Mitral valve: There was trivial regurgitation. - Left atrium: The atrium was severely dilated. - Right ventricle: The cavity size was normal. Systolic function was normal. - Right atrium: The atrium was severely dilated. - Tricuspid valve: Peak RV-RA gradient (S): 25 mm Hg. - Pulmonary arteries: PA peak pressure: 33 mm Hg (S). - Systemic veins: IVC measured 2.0 cm with < 50% respirophasic variation, suggesting RA pressure 8 mmHg.  Impressions:  - The patient was in atrial fibrillation. Normal LV size with mild LV hypertrophy. EF 60-65%. Normal RV size and systolic function. Severe biatrial enlargement.  EKG:  EKG is ordered today.  The ekg ordered today demonstrates Atrial fib, rate 75 bpm.   Recent Labs: 10/22/2015: ALT 8; BUN 15; Creatinine, Ser 1.05; Potassium 3.8; Sodium 139   Lipid Panel    Component Value Date/Time   CHOL 129 04/26/2015 1649   TRIG 129.0 04/26/2015 1649   HDL 33.70 (L) 04/26/2015 1649   CHOLHDL 4 04/26/2015 1649   VLDL 25.8 04/26/2015 1649   LDLCALC 69 04/26/2015 1649     Wt Readings from Last 3 Encounters:  10/22/15 260 lb (117.9 kg)  05/31/15 255 lb (115.7 kg)  04/26/15 258 lb (117 kg)     Other studies Reviewed: Additional studies/ records that were reviewed today include: . Review of the above records demonstrates:   Assessment and Plan:   1. Atrial fibrillation, persistent:  Rate controlled today. She is asymptomatic. Will continue rate control strategy with anti-coagulation. Will continue Eliquis 7m po BID. Will continue Lopressor and Cardizem for rate control.   2. HTN: BP elevated today and at home. Will increase Lisinopril to 40 mg daily and check BMET in one week.   Current medicines are reviewed at length with the patient today.  The patient does not have concerns regarding medicines.  The following changes have been made:   Lisinopril increased.   Labs/ tests ordered today include:   No orders of the defined types were placed in this encounter.   Disposition:   FU with me in 12  months  Signed, CLauree Chandler MD 01/31/2016 11:00 AM    [email or web address] [place]  [id number]: [phone] Fax: [phone]

## 2016-02-25 ENCOUNTER — Encounter: Payer: Self-pay | Admitting: Cardiovascular Disease

## 2016-02-25 ENCOUNTER — Telehealth: Payer: Self-pay | Admitting: Internal Medicine

## 2016-02-25 ENCOUNTER — Other Ambulatory Visit: Payer: Self-pay | Admitting: Internal Medicine

## 2016-02-25 NOTE — Telephone Encounter (Signed)
Pt daughter called in and would like to know if pt could get some samples of  eliquis

## 2016-02-25 NOTE — Telephone Encounter (Signed)
No samples available, patient aware 

## 2016-03-03 DIAGNOSIS — M25561 Pain in right knee: Secondary | ICD-10-CM | POA: Diagnosis not present

## 2016-03-03 DIAGNOSIS — G8929 Other chronic pain: Secondary | ICD-10-CM | POA: Diagnosis not present

## 2016-03-05 ENCOUNTER — Encounter: Payer: Self-pay | Admitting: Internal Medicine

## 2016-03-05 ENCOUNTER — Ambulatory Visit (INDEPENDENT_AMBULATORY_CARE_PROVIDER_SITE_OTHER): Payer: Commercial Managed Care - HMO | Admitting: Internal Medicine

## 2016-03-05 DIAGNOSIS — I482 Chronic atrial fibrillation, unspecified: Secondary | ICD-10-CM

## 2016-03-05 DIAGNOSIS — I10 Essential (primary) hypertension: Secondary | ICD-10-CM

## 2016-03-05 DIAGNOSIS — I872 Venous insufficiency (chronic) (peripheral): Secondary | ICD-10-CM | POA: Diagnosis not present

## 2016-03-05 MED ORDER — WARFARIN SODIUM 5 MG PO TABS: 5.0000 mg | ORAL_TABLET | Freq: Every day | ORAL | 3 refills | Status: DC

## 2016-03-05 NOTE — Patient Instructions (Addendum)
We have sent in the coumadin pills. Take 1 pill daily until you see the coumadin nurse.   Stop taking the eliquis.  We will have you come back in 1-2 weeks for a check of the levels and they may need to adjust the medicine at that time.  Double the lasix for the next 5 days to help with the fluid. Take 1 pill in the morning and 1 pill around 2PM that way you do not have to pee extra during the night.   Look at home to see if you have diltiazem (cartia). If not you should call the pharmacy for a refill as this is a blood pressure medicine you should be taking.

## 2016-03-05 NOTE — Progress Notes (Signed)
Pre visit review using our clinic review tool, if applicable. No additional management support is needed unless otherwise documented below in the visit note. 

## 2016-03-05 NOTE — Progress Notes (Signed)
   Subjective:    Patient ID: Robin DutyBarbara P Lumadue, female    DOB: 04-26-1940, 75 y.o.   MRN: [id number]  HPI The patient is a 75 YO female coming in for more swelling in her legs. She does have chronic venous insufficiency. She takes lasix and hctz and feels they have not been working well lately. She does have eliquis as well for A fib and has not missed any doses lately However she is not able to afford eliquis and either needs to switch medicines or she will need to stop taking it. She does admit to some increase in her sodium over the Thanksgiving holiday recently. She tried to be good and is frustrated that her children were telling her what she shouldn't eat and how much she should eat.   Review of Systems  Constitutional: Negative for activity change, appetite change, fatigue and fever.  Respiratory: Negative.   Cardiovascular: Positive for leg swelling. Negative for chest pain and palpitations.  Gastrointestinal: Negative.   Musculoskeletal: Negative.   Skin: Negative.       Objective:   Physical Exam  Constitutional: She is oriented to person, place, and time. She appears well-developed and well-nourished.  Overweight  HENT:  Head: Normocephalic and atraumatic.  Eyes: EOM are normal.  Cardiovascular: Normal rate.   No murmur heard. irreg irreg  Pulmonary/Chest: Effort normal and breath sounds normal. No respiratory distress. She has no wheezes. She has no rales.  Abdominal: Soft.  Musculoskeletal: She exhibits edema.  1-2 + edema bilaterally and also some soft tissue in the legs bilaterally.   Neurological: She is alert and oriented to person, place, and time.  Skin: Skin is warm and dry.   Vitals:   03/05/16 1557 03/05/16 1615  BP: (!) 162/100 (!) 152/100  Pulse: 67   Resp: 18   Temp: 98.6 F (37 C)   TempSrc: Oral   SpO2: 97%   Weight: 275 lb (124.7 kg)   Height: 5\' 7"  (1.702 m)       Assessment & Plan:

## 2016-03-06 NOTE — Assessment & Plan Note (Signed)
Needs anticoagulation. Cannot afford eliquis and previously has applied for help from the company and denied as she gets too much money per month. While in doughnut hole she is asked to pay $500 per month. Will switch to warfarin and she agrees to INR monitoring at our coumadin clinic.

## 2016-03-06 NOTE — Assessment & Plan Note (Signed)
Acute on chronic today, will increase lasix to double for the next 3-4 days. Suspect worsening due to holidays and talked to her about trying to be better during christmas holiday.

## 2016-03-06 NOTE — Assessment & Plan Note (Signed)
BP above goal and she is not taking cartia right now. Advised her to resume and continue lasix, hctz, lisinopril, metoprolol as well.

## 2016-03-18 ENCOUNTER — Ambulatory Visit (INDEPENDENT_AMBULATORY_CARE_PROVIDER_SITE_OTHER): Payer: Commercial Managed Care - HMO | Admitting: General Practice

## 2016-03-18 ENCOUNTER — Other Ambulatory Visit: Payer: Self-pay | Admitting: General Practice

## 2016-03-18 DIAGNOSIS — Z5181 Encounter for therapeutic drug level monitoring: Secondary | ICD-10-CM | POA: Diagnosis not present

## 2016-03-18 DIAGNOSIS — I4891 Unspecified atrial fibrillation: Secondary | ICD-10-CM

## 2016-03-18 DIAGNOSIS — Z7189 Other specified counseling: Secondary | ICD-10-CM | POA: Insufficient documentation

## 2016-03-18 LAB — POCT INR: INR: 3

## 2016-03-18 NOTE — Patient Instructions (Signed)
Pre visit review using our clinic review tool, if applicable. No additional management support is needed unless otherwise documented below in the visit note. 

## 2016-03-18 NOTE — Progress Notes (Signed)
I have reviewed and agree with the plan. 

## 2016-03-20 ENCOUNTER — Ambulatory Visit (INDEPENDENT_AMBULATORY_CARE_PROVIDER_SITE_OTHER): Payer: Commercial Managed Care - HMO | Admitting: Neurology

## 2016-03-20 ENCOUNTER — Encounter: Payer: Self-pay | Admitting: Neurology

## 2016-03-20 VITALS — BP 150/104 | HR 99 | Ht 67.0 in | Wt 270.6 lb

## 2016-03-20 DIAGNOSIS — E1159 Type 2 diabetes mellitus with other circulatory complications: Secondary | ICD-10-CM | POA: Diagnosis not present

## 2016-03-20 DIAGNOSIS — I10 Essential (primary) hypertension: Secondary | ICD-10-CM | POA: Diagnosis not present

## 2016-03-20 DIAGNOSIS — E785 Hyperlipidemia, unspecified: Secondary | ICD-10-CM

## 2016-03-20 DIAGNOSIS — I6312 Cerebral infarction due to embolism of basilar artery: Secondary | ICD-10-CM | POA: Diagnosis not present

## 2016-03-20 DIAGNOSIS — I4891 Unspecified atrial fibrillation: Secondary | ICD-10-CM | POA: Diagnosis not present

## 2016-03-20 MED ORDER — METOPROLOL TARTRATE 25 MG PO TABS: 25.0000 mg | ORAL_TABLET | Freq: Two times a day (BID) | ORAL | 3 refills | Status: DC

## 2016-03-20 NOTE — Progress Notes (Signed)
STROKE NEUROLOGY FOLLOW UP NOTE  NAME: Robin Snow DOB: 1940/04/23  REASON FOR VISIT: stroke follow up HISTORY FROM: pt and chart  Today we had the pleasure of seeing Robin Snow in follow-up at our Neurology Clinic. Pt was accompanied by daughter.   History Summary Robin Snow is a 75 y.o. female with history of HTN, DM, afib not on anticoagulation due to history of GIB from diverticular source. She had acute onset right arm weakness on 11/29/2013 on waking up. On EMS arrival to the scene, she has had significant improvement in her right arm. Patient was not administered TPA secondary to delay in arrival. She was admitted for further evaluation and treatment.MRI showed punctate acute infarction at the right inferior pons. Normal MRA. Carotid Dopplers unremarkable. LDL was 62 and Hgb a1c 5.7 on Glipizide. She has history of diverticular bleed x 2 while on warfarin which has been discontinued. She had an colonoscopy but no culprit lesion was found. She was started on Eliquis for stroke prevention.   01/10/14 follow up - Blood pressure is well controlled, it is 110/78 in the office today. She feels she is back to her baseline from stroke, is only having problems with chronic right knee pain. She is getting Home PT for this. She has no new complaints today.   05/14/14 follow up - the patient has been doing well.  No recurrent stroke symptoms. She is on Eliquis now, however she had financial difficulties with continue Eliquis. Showed patient and her daughter the waist to get coupon from [employer]. Patient also waiting for some financial program support. She stated that her blood pressure and glucose at home was controlled. Her blood pressure 121/74 today.  She came in on wheelchair today because bilaterally knee pain. She denies any weakness of lower extremities.  09/07/14 follow up - pt has been doing well from stroke standpoint. However, she still has bilateral knee pain which  limited her activity and exercise. She is basically wheelchair bound. Today in clinic she had right knee pain, swollen, warm, and redness. She stated that this happened intermittently along with right-sided knee pain. She has an appointment with pain management Dr. Myna Hidalgo in 1 week who is treating her for knee pain with knee joint aspirations. She is still taking eliquis although she continued to have financial difficulties. Her blood pressure today in clinic 125/73.  03/22/15 follow up - pt has been doing well. Right knee pain much better and right leg weakness also much improved. Now she has started to have left knee pain, and has been following with Dr. Myna Hidalgo. Has appointment next month. She just finished PT 2 weeks ago, now able to walk with walker or cane at home. On eliquis tolerating well, no GIB. BP 144/91 today in clinic.  Interval History During the interval time, pt has been doing the same. She can not afford eliquis and has to switch back to coumadin. Her last INR was 3.0. She has cardiology follow up in 06/2016. Her BP still high at home 140s-150s. Today in clinic 150/104. HR today 99. She still in wheelchair due to her bilateral knee pain and obesity. Has TENS unit in the back.   REVIEW OF SYSTEMS: Full 14 system review of systems performed and notable only for those listed below and in HPI above, all others are negative:  Constitutional:  Activity change, chills, unexpected weight change Cardiovascular: leg swelling, murmur Ear/Nose/Throat:   Skin:  Eyes:   Respiratory:  SOB  Gastroitestinal:   Genitourinary: frequent urination Hematology/Lymphatic:   Endocrine: cold intolerance Musculoskeletal:  Joint pain, aching muscles, walking difficulty, neck pain Allergy/Immunology:   Neurological:   Psychiatric:  Sleep:    The following represents the patient's updated allergies and side effects list: Allergies  Allergen Reactions  . Aspirin Hives  . Hydrocodone Hives    Tolerates  oxycodone and tramadol  . Rofecoxib Hives    The neurologically relevant items on the patient's problem list were reviewed on today's visit.  Neurologic Examination  A problem focused neurological exam (12 or more points of the single system neurologic examination, vital signs counts as 1 point, cranial nerves count for 8 points) was performed.  Blood pressure (!) 150/104, pulse 99, height 5\' 7"  (1.702 m), weight 270 lb 9.6 oz (122.7 kg).  General - Well nourished, well developed, in no apparent distress.  Ophthalmologic - fundi not visualized due to incorporation.  Cardiovascular - irregularly irregular heart rate and rhythm.  Mental Status -  Level of arousal and orientation to time, place, and person were intact. Language including expression, naming, repetition, comprehension was assessed and found intact.  Cranial Nerves II - XII - II - Visual field intact OU. III, IV, VI - Extraocular movements intact. V - Facial sensation intact bilaterally. VII - Facial movement intact bilaterally. VIII - Hearing & vestibular intact bilaterally. X - Palate elevates symmetrically. XI - Chin turning & shoulder shrug intact bilaterally. XII - Tongue protrusion intact.  Motor Strength - The patient's strength was 4+/5 in all extremities and pronator drift was absent. Bulk was normal and fasciculations were absent.   Motor Tone - Muscle tone was assessed at the neck and appendages and was normal.  Reflexes - The patient's reflexes were 1+ in all extremities and she had no pathological reflexes.  Sensory - Light touch, temperature/pinprick were assessed and were normal.    Coordination - The patient had normal movements in the hands with no ataxia or dysmetria.  Tremor was absent.  Gait and Station - in wheelchair, not tested due to safety concerns.  Data reviewed: I personally reviewed the images and agree with the radiology interpretations.  Ct Head Wo Contrast  11/29/2013  IMPRESSION: No acute intracranial abnormalities. Chronic atrophy and small vessel ischemic changes.  Mri and Mra Brain Without Contrast  11/29/2013 IMPRESSION: Punctate acute infarction at the right inferior pons. Moderate chronic small-vessel changes affecting the cerebral hemispheric white matter. Normal MR angiography of the large and medium size vessels.    CUS - Bilateral: 1-39% ICA stenosis. Vertebral artery flow is antegrade.  2D echo - - The patient was in atrial fibrillation. Normal LV size with mild LV hypertrophy. EF 60-65%. Normal RV size and systolic function. Severe biatrial enlargement.  Component     Latest Ref Rng 11/13/2013 11/29/2013 11/30/2013 05/11/2014  Cholesterol     0 - 200 mg/dL   416 606  Triglycerides     0.0 - 149.0 mg/dL   68 301.6  HDL     >01.09 mg/dL   33 (L) 32.35 (L)  VLDL     0.0 - 40.0 mg/dL   14 57.3  LDL (calc)     0 - 99 mg/dL   62 92  Total CHOL/HDL Ratio        3.3 5  NonHDL         118.80  Hemoglobin A1C     4.6 - 6.5 %  5.7 (H)  5.7  Mean Plasma Glucose     <  117 mg/dL  253 (H)    TSH     6.644 - 4.500 uIU/mL 0.938       Assessment: As you may recall, she is a 75 y.o. African American female with PMH of HTN, DM, afib off coumadin due to history of GIB from diverticular source (however colonoscopy negative) admitted on 11/29/2013 for right arm weakness.MRI showed punctate acute infarction at the right inferior pons. Normal MRA. LDL 62 and Hgb a1c 5.7. She was started on Eliquis for stroke prevention. During the interval time, she continued on eliquis and tolerating well, no GIB. Knee pain improved and able to walk with walker and cane at home. Last LDL 92. However, she can not continue to afford eliquis, now back to coumadin. Last INR 3.0. Still has high BP, will increase metoprolol dose. Still in wheelchair due to b/l knee pain and back pain on TENS unit and obesity.   Plan:  - continue coumadin and lovastatin for stroke  prevention - INR monitoring and goal 2-3 - increase metoprolol to 25mg  bid - check BP at home and record and bring over to PCP for medication adjustment if needed - Follow up with your primary care physician for stroke risk factor modification. Recommend maintain blood pressure goal <130/80, diabetes with hemoglobin A1c goal below 6.5% and lipids with LDL cholesterol goal below 70 mg/dL.  - follow up with cardiologist  - follow up with orthopedics for the knee pain and back stimulator - continue home exercise - follow up as needed  I spent more than 25 minutes of face to face time with the patient. Greater than 50% of time was spent in counseling and coordination of care. We discussed medication complains, monitoring INR, BP monitoring and follow up with PCP.   No orders of the defined types were placed in this encounter.   Meds ordered this encounter  Medications  . DISCONTD: ELIQUIS 5 MG TABS tablet  . metoprolol tartrate (LOPRESSOR) 25 MG tablet    Sig: Take 1 tablet (25 mg total) by mouth 2 (two) times daily.    Dispense:  90 tablet    Refill:  3    Patient Instructions  - continue coumadin and lovastatin for stroke prevention - INR monitoring and goal 2-3 - increase metoprolol to 25mg  bid - check BP at home - Follow up with your primary care physician for stroke risk factor modification. Recommend maintain blood pressure goal <130/80, diabetes with hemoglobin A1c goal below 6.5% and lipids with LDL cholesterol goal below 70 mg/dL.  - follow up with cardiologist  - follow up with orthopedics for the knee pain and back stimulator - continue home exercise - follow up as needed   Marvel Plan, MD PhD [facility] [address] [place], [place] [address] [phone]

## 2016-03-20 NOTE — Patient Instructions (Addendum)
-   continue coumadin and lovastatin for stroke prevention - INR monitoring and goal 2-3 - increase metoprolol to 25mg  bid - check BP at home - Follow up with your primary care physician for stroke risk factor modification. Recommend maintain blood pressure goal <130/80, diabetes with hemoglobin A1c goal below 6.5% and lipids with LDL cholesterol goal below 70 mg/dL.  - follow up with cardiologist  - follow up with orthopedics for the knee pain and back stimulator - continue home exercise - follow up as needed

## 2016-03-23 ENCOUNTER — Other Ambulatory Visit: Payer: Self-pay | Admitting: Internal Medicine

## 2016-03-23 DIAGNOSIS — I4891 Unspecified atrial fibrillation: Secondary | ICD-10-CM

## 2016-03-27 ENCOUNTER — Ambulatory Visit (INDEPENDENT_AMBULATORY_CARE_PROVIDER_SITE_OTHER): Payer: Commercial Managed Care - HMO | Admitting: General Practice

## 2016-03-27 DIAGNOSIS — Z5181 Encounter for therapeutic drug level monitoring: Secondary | ICD-10-CM

## 2016-03-27 DIAGNOSIS — I4891 Unspecified atrial fibrillation: Secondary | ICD-10-CM

## 2016-03-27 LAB — POCT INR: INR: 6

## 2016-03-27 NOTE — Patient Instructions (Signed)
Pre visit review using our clinic review tool, if applicable. No additional management support is needed unless otherwise documented below in the visit note. 

## 2016-03-27 NOTE — Progress Notes (Signed)
I have reviewed and agree with the plan. 

## 2016-04-02 ENCOUNTER — Ambulatory Visit (INDEPENDENT_AMBULATORY_CARE_PROVIDER_SITE_OTHER): Payer: Commercial Managed Care - HMO | Admitting: General Practice

## 2016-04-02 DIAGNOSIS — Z5181 Encounter for therapeutic drug level monitoring: Secondary | ICD-10-CM | POA: Diagnosis not present

## 2016-04-02 LAB — POCT INR: INR: 1.4

## 2016-04-02 NOTE — Patient Instructions (Signed)
Pre visit review using our clinic review tool, if applicable. No additional management support is needed unless otherwise documented below in the visit note. 

## 2016-04-02 NOTE — Progress Notes (Signed)
Medical treatment/procedure(s) were performed by non-physician practitioner and as supervising physician I was immediately available for consultation/collaboration. I agree with above. Calum Cormier A Leylany Nored, MD  

## 2016-04-03 ENCOUNTER — Telehealth: Payer: Self-pay | Admitting: Internal Medicine

## 2016-04-03 NOTE — Telephone Encounter (Signed)
Called daughter back.  Got scheduled for Saturday Clinic.

## 2016-04-03 NOTE — Telephone Encounter (Signed)
No, this is not related. She can come to Saturday clinic if needed.

## 2016-04-03 NOTE — Telephone Encounter (Signed)
Daughter [facility].  States patient is feeling lightheaded when trying to stand today.  Would like to know if this could be due to her coum levels?

## 2016-04-04 ENCOUNTER — Ambulatory Visit (INDEPENDENT_AMBULATORY_CARE_PROVIDER_SITE_OTHER): Payer: Commercial Managed Care - HMO | Admitting: Family Medicine

## 2016-04-04 ENCOUNTER — Encounter: Payer: Self-pay | Admitting: Family Medicine

## 2016-04-04 VITALS — BP 162/90 | HR 125 | Temp 98.1°F | Resp 16

## 2016-04-04 DIAGNOSIS — H8110 Benign paroxysmal vertigo, unspecified ear: Secondary | ICD-10-CM | POA: Diagnosis not present

## 2016-04-04 DIAGNOSIS — I4891 Unspecified atrial fibrillation: Secondary | ICD-10-CM

## 2016-04-04 DIAGNOSIS — I480 Paroxysmal atrial fibrillation: Secondary | ICD-10-CM | POA: Diagnosis not present

## 2016-04-04 DIAGNOSIS — E1159 Type 2 diabetes mellitus with other circulatory complications: Secondary | ICD-10-CM | POA: Diagnosis not present

## 2016-04-04 DIAGNOSIS — I10 Essential (primary) hypertension: Secondary | ICD-10-CM

## 2016-04-04 LAB — GLUCOSE, POCT (MANUAL RESULT ENTRY): POC GLUCOSE: 268 mg/dL — AB (ref 70–99)

## 2016-04-04 MED ORDER — MECLIZINE HCL 25 MG PO TABS: 25.0000 mg | ORAL_TABLET | ORAL | 0 refills | Status: DC | PRN

## 2016-04-04 MED ORDER — METOPROLOL TARTRATE 25 MG PO TABS: 50.0000 mg | ORAL_TABLET | Freq: Two times a day (BID) | ORAL | 3 refills | Status: DC

## 2016-04-04 NOTE — Progress Notes (Signed)
Pre visit review using our clinic review tool, if applicable. No additional management support is needed unless otherwise documented below in the visit note. 

## 2016-04-04 NOTE — Progress Notes (Signed)
Subjective:    Patient ID: Robin Snow, female    DOB: 01-21-1941, 75 y.o.   MRN: [id number]  HPI Here with her daughter for the sudden onset 2 days ago of dizziness. She describes this as feeling like the room is spinning around her. No headache or sinus pressure. No burred vision. No neurlogic deficits. No chest pain or SOB. She had her Coumadin dose adjusted this past week. She saw Dr. Roda Shutters in Neruology on 03-20-16 and he increased her dose of Metoprolol from 1/2 tab bid to a whole tab bid. Her BP and heart rate both remain elevated. Her last A1c in July was 5.9. A random glucose today one hour after breakfast is 238.  Review of Systems  Constitutional: Negative.   HENT: Negative.   Eyes: Negative.   Respiratory: Negative.   Cardiovascular: Negative.   Neurological: Positive for dizziness. Negative for tremors, seizures, syncope, facial asymmetry, speech difficulty, weakness, light-headedness, numbness and headaches.       Objective:   Physical Exam  Constitutional: She is oriented to person, place, and time. She appears well-developed and well-nourished.  Alert, in her wheelchair   Eyes: Conjunctivae are normal. Pupils are equal, round, and reactive to light.  Neck: Neck supple. No thyromegaly present.  Cardiovascular: Regular rhythm, normal heart sounds and intact distal pulses.   No murmur heard. Rapid rate   Pulmonary/Chest: Effort normal and breath sounds normal. No respiratory distress. She has no wheezes. She has no rales.  Lymphadenopathy:    She has no cervical adenopathy.  Neurological: She is alert and oriented to person, place, and time. No cranial nerve deficit. She exhibits normal muscle tone.          Assessment & Plan:  Acute onset of dizziness consistent with vestibular dysfunction. Try Meclizine 25 mg as needed. Drink fluids. For the HTN and tachycardia we will increase the Metoprolol to taking 2 tabs (a total of 50 mg) bid. Recheck with DR. Crawford next  week.  Gershon Crane, MD

## 2016-04-06 DIAGNOSIS — N12 Tubulo-interstitial nephritis, not specified as acute or chronic: Secondary | ICD-10-CM

## 2016-04-06 DIAGNOSIS — N179 Acute kidney failure, unspecified: Secondary | ICD-10-CM

## 2016-04-06 HISTORY — DX: Acute kidney failure, unspecified: N17.9

## 2016-04-06 HISTORY — DX: Tubulo-interstitial nephritis, not specified as acute or chronic: N12

## 2016-04-10 ENCOUNTER — Ambulatory Visit (INDEPENDENT_AMBULATORY_CARE_PROVIDER_SITE_OTHER): Payer: Medicare HMO | Admitting: General Practice

## 2016-04-10 DIAGNOSIS — Z5181 Encounter for therapeutic drug level monitoring: Secondary | ICD-10-CM

## 2016-04-10 LAB — POCT INR: INR: 3.3

## 2016-04-10 NOTE — Patient Instructions (Signed)
Pre visit review using our clinic review tool, if applicable. No additional management support is needed unless otherwise documented below in the visit note. 

## 2016-04-10 NOTE — Progress Notes (Signed)
I have reviewed and agree with the plan. 

## 2016-04-12 ENCOUNTER — Encounter (HOSPITAL_COMMUNITY): Payer: Self-pay

## 2016-04-12 DIAGNOSIS — I10 Essential (primary) hypertension: Secondary | ICD-10-CM | POA: Diagnosis present

## 2016-04-12 DIAGNOSIS — A4151 Sepsis due to Escherichia coli [E. coli]: Principal | ICD-10-CM | POA: Diagnosis present

## 2016-04-12 DIAGNOSIS — Z7901 Long term (current) use of anticoagulants: Secondary | ICD-10-CM | POA: Diagnosis not present

## 2016-04-12 DIAGNOSIS — E118 Type 2 diabetes mellitus with unspecified complications: Secondary | ICD-10-CM | POA: Diagnosis not present

## 2016-04-12 DIAGNOSIS — Z8673 Personal history of transient ischemic attack (TIA), and cerebral infarction without residual deficits: Secondary | ICD-10-CM | POA: Diagnosis not present

## 2016-04-12 DIAGNOSIS — E785 Hyperlipidemia, unspecified: Secondary | ICD-10-CM | POA: Diagnosis present

## 2016-04-12 DIAGNOSIS — K219 Gastro-esophageal reflux disease without esophagitis: Secondary | ICD-10-CM | POA: Diagnosis present

## 2016-04-12 DIAGNOSIS — N280 Ischemia and infarction of kidney: Secondary | ICD-10-CM | POA: Diagnosis not present

## 2016-04-12 DIAGNOSIS — N390 Urinary tract infection, site not specified: Secondary | ICD-10-CM | POA: Diagnosis present

## 2016-04-12 DIAGNOSIS — Z79899 Other long term (current) drug therapy: Secondary | ICD-10-CM

## 2016-04-12 DIAGNOSIS — N179 Acute kidney failure, unspecified: Secondary | ICD-10-CM | POA: Diagnosis not present

## 2016-04-12 DIAGNOSIS — Z23 Encounter for immunization: Secondary | ICD-10-CM

## 2016-04-12 DIAGNOSIS — I482 Chronic atrial fibrillation: Secondary | ICD-10-CM | POA: Diagnosis not present

## 2016-04-12 DIAGNOSIS — Z885 Allergy status to narcotic agent status: Secondary | ICD-10-CM | POA: Diagnosis not present

## 2016-04-12 DIAGNOSIS — Z6841 Body Mass Index (BMI) 40.0 and over, adult: Secondary | ICD-10-CM

## 2016-04-12 DIAGNOSIS — E1159 Type 2 diabetes mellitus with other circulatory complications: Secondary | ICD-10-CM | POA: Diagnosis not present

## 2016-04-12 DIAGNOSIS — I480 Paroxysmal atrial fibrillation: Secondary | ICD-10-CM | POA: Diagnosis not present

## 2016-04-12 DIAGNOSIS — N12 Tubulo-interstitial nephritis, not specified as acute or chronic: Secondary | ICD-10-CM | POA: Diagnosis not present

## 2016-04-12 DIAGNOSIS — K5790 Diverticulosis of intestine, part unspecified, without perforation or abscess without bleeding: Secondary | ICD-10-CM | POA: Diagnosis present

## 2016-04-12 DIAGNOSIS — Z886 Allergy status to analgesic agent status: Secondary | ICD-10-CM

## 2016-04-12 DIAGNOSIS — R1032 Left lower quadrant pain: Secondary | ICD-10-CM | POA: Diagnosis not present

## 2016-04-12 DIAGNOSIS — E119 Type 2 diabetes mellitus without complications: Secondary | ICD-10-CM | POA: Diagnosis not present

## 2016-04-12 DIAGNOSIS — R109 Unspecified abdominal pain: Secondary | ICD-10-CM | POA: Diagnosis not present

## 2016-04-12 MED ORDER — ONDANSETRON 4 MG PO TBDP
ORAL_TABLET | ORAL | Status: AC
  Filled 2016-04-12: qty 1

## 2016-04-12 MED ORDER — ONDANSETRON 4 MG PO TBDP
4.0000 mg | ORAL_TABLET | Freq: Once | ORAL | Status: AC
  Administered 2016-04-12: 4 mg via ORAL

## 2016-04-12 NOTE — ED Triage Notes (Signed)
Pt complaining of lower abdominal pain. Pt states frequent urination. Pt states 1 episode of emesis today. Pt denies any vaginal bleeding or discharge.

## 2016-04-13 ENCOUNTER — Inpatient Hospital Stay (HOSPITAL_COMMUNITY): Payer: Medicare HMO

## 2016-04-13 ENCOUNTER — Encounter (HOSPITAL_COMMUNITY): Payer: Self-pay | Admitting: General Practice

## 2016-04-13 ENCOUNTER — Emergency Department (HOSPITAL_COMMUNITY): Payer: Medicare HMO

## 2016-04-13 ENCOUNTER — Inpatient Hospital Stay (HOSPITAL_COMMUNITY)
Admission: EM | Admit: 2016-04-13 | Discharge: 2016-04-16 | DRG: 872 | Disposition: A | Discharge status: Home / Self Care | Payer: Medicare HMO | Attending: Internal Medicine | Admitting: Internal Medicine

## 2016-04-13 DIAGNOSIS — E119 Type 2 diabetes mellitus without complications: Secondary | ICD-10-CM | POA: Diagnosis present

## 2016-04-13 DIAGNOSIS — N179 Acute kidney failure, unspecified: Secondary | ICD-10-CM | POA: Diagnosis present

## 2016-04-13 DIAGNOSIS — Z885 Allergy status to narcotic agent status: Secondary | ICD-10-CM | POA: Diagnosis not present

## 2016-04-13 DIAGNOSIS — N280 Ischemia and infarction of kidney: Secondary | ICD-10-CM | POA: Diagnosis present

## 2016-04-13 DIAGNOSIS — Z23 Encounter for immunization: Secondary | ICD-10-CM | POA: Diagnosis present

## 2016-04-13 DIAGNOSIS — I482 Chronic atrial fibrillation: Secondary | ICD-10-CM | POA: Diagnosis present

## 2016-04-13 DIAGNOSIS — K219 Gastro-esophageal reflux disease without esophagitis: Secondary | ICD-10-CM | POA: Diagnosis present

## 2016-04-13 DIAGNOSIS — K5790 Diverticulosis of intestine, part unspecified, without perforation or abscess without bleeding: Secondary | ICD-10-CM | POA: Diagnosis present

## 2016-04-13 DIAGNOSIS — E1159 Type 2 diabetes mellitus with other circulatory complications: Secondary | ICD-10-CM | POA: Diagnosis not present

## 2016-04-13 DIAGNOSIS — Z7901 Long term (current) use of anticoagulants: Secondary | ICD-10-CM | POA: Diagnosis not present

## 2016-04-13 DIAGNOSIS — I10 Essential (primary) hypertension: Secondary | ICD-10-CM | POA: Diagnosis present

## 2016-04-13 DIAGNOSIS — E785 Hyperlipidemia, unspecified: Secondary | ICD-10-CM | POA: Diagnosis present

## 2016-04-13 DIAGNOSIS — R109 Unspecified abdominal pain: Secondary | ICD-10-CM

## 2016-04-13 DIAGNOSIS — R10A2 Flank pain, left side: Secondary | ICD-10-CM

## 2016-04-13 DIAGNOSIS — I4891 Unspecified atrial fibrillation: Secondary | ICD-10-CM

## 2016-04-13 DIAGNOSIS — I480 Paroxysmal atrial fibrillation: Secondary | ICD-10-CM | POA: Diagnosis not present

## 2016-04-13 DIAGNOSIS — E118 Type 2 diabetes mellitus with unspecified complications: Secondary | ICD-10-CM | POA: Diagnosis not present

## 2016-04-13 DIAGNOSIS — N12 Tubulo-interstitial nephritis, not specified as acute or chronic: Secondary | ICD-10-CM | POA: Diagnosis present

## 2016-04-13 DIAGNOSIS — Z8673 Personal history of transient ischemic attack (TIA), and cerebral infarction without residual deficits: Secondary | ICD-10-CM | POA: Diagnosis not present

## 2016-04-13 DIAGNOSIS — Z79899 Other long term (current) drug therapy: Secondary | ICD-10-CM | POA: Diagnosis not present

## 2016-04-13 DIAGNOSIS — A4151 Sepsis due to Escherichia coli [E. coli]: Secondary | ICD-10-CM | POA: Diagnosis present

## 2016-04-13 DIAGNOSIS — N390 Urinary tract infection, site not specified: Secondary | ICD-10-CM

## 2016-04-13 DIAGNOSIS — Z886 Allergy status to analgesic agent status: Secondary | ICD-10-CM | POA: Diagnosis not present

## 2016-04-13 DIAGNOSIS — Z6841 Body Mass Index (BMI) 40.0 and over, adult: Secondary | ICD-10-CM | POA: Diagnosis not present

## 2016-04-13 DIAGNOSIS — I4821 Permanent atrial fibrillation: Secondary | ICD-10-CM | POA: Diagnosis present

## 2016-04-13 HISTORY — DX: Acute kidney failure, unspecified: N17.9

## 2016-04-13 HISTORY — DX: Tubulo-interstitial nephritis, not specified as acute or chronic: N12

## 2016-04-13 LAB — URINALYSIS, ROUTINE W REFLEX MICROSCOPIC
Bilirubin Urine: NEGATIVE
GLUCOSE, UA: NEGATIVE mg/dL
Hgb urine dipstick: NEGATIVE
KETONES UR: NEGATIVE mg/dL
Nitrite: NEGATIVE
Protein, ur: 30 mg/dL — AB
Specific Gravity, Urine: 1.012 (ref 1.005–1.030)
pH: 6 (ref 5.0–8.0)

## 2016-04-13 LAB — GLUCOSE, CAPILLARY
Glucose-Capillary: 109 mg/dL — ABNORMAL HIGH (ref 65–99)
Glucose-Capillary: 122 mg/dL — ABNORMAL HIGH (ref 65–99)
Glucose-Capillary: 128 mg/dL — ABNORMAL HIGH (ref 65–99)

## 2016-04-13 LAB — COMPREHENSIVE METABOLIC PANEL
ALT: 19 U/L (ref 14–54)
AST: 21 U/L (ref 15–41)
Albumin: 3.7 g/dL (ref 3.5–5.0)
Alkaline Phosphatase: 94 U/L (ref 38–126)
Anion gap: 10 (ref 5–15)
BUN: 9 mg/dL (ref 6–20)
CHLORIDE: 104 mmol/L (ref 101–111)
CO2: 26 mmol/L (ref 22–32)
CREATININE: 1.44 mg/dL — AB (ref 0.44–1.00)
Calcium: 9 mg/dL (ref 8.9–10.3)
GFR, EST AFRICAN AMERICAN: 40 mL/min — AB (ref >60)
GFR, EST NON AFRICAN AMERICAN: 34 mL/min — AB (ref >60)
Glucose, Bld: 145 mg/dL — ABNORMAL HIGH (ref 65–99)
POTASSIUM: 4.5 mmol/L (ref 3.5–5.1)
SODIUM: 140 mmol/L (ref 135–145)
Total Bilirubin: 0.7 mg/dL (ref 0.3–1.2)
Total Protein: 8 g/dL (ref 6.5–8.1)

## 2016-04-13 LAB — CBC
HEMATOCRIT: 38.5 % (ref 36.0–46.0)
Hemoglobin: 12.7 g/dL (ref 12.0–15.0)
MCH: 29 pg (ref 26.0–34.0)
MCHC: 33 g/dL (ref 30.0–36.0)
MCV: 87.9 fL (ref 78.0–100.0)
Platelets: 152 10*3/uL (ref 150–400)
RBC: 4.38 MIL/uL (ref 3.87–5.11)
RDW: 15 % (ref 11.5–15.5)
WBC: 6.5 10*3/uL (ref 4.0–10.5)

## 2016-04-13 LAB — PROTIME-INR
INR: 2.56
Prothrombin Time: 28 seconds — ABNORMAL HIGH (ref 11.4–15.2)

## 2016-04-13 LAB — LIPASE, BLOOD: Lipase: 14 U/L (ref 11–51)

## 2016-04-13 MED ORDER — IOPAMIDOL (ISOVUE-300) INJECTION 61%
INTRAVENOUS | Status: AC
  Administered 2016-04-13: 75 mL via INTRAVENOUS
  Filled 2016-04-13: qty 75

## 2016-04-13 MED ORDER — ONDANSETRON HCL 4 MG PO TABS: 4.0000 mg | ORAL_TABLET | Freq: Four times a day (QID) | ORAL | Status: DC | PRN

## 2016-04-13 MED ORDER — FENTANYL CITRATE (PF) 100 MCG/2ML IJ SOLN
50.0000 ug | Freq: Once | INTRAMUSCULAR | Status: AC
  Administered 2016-04-13: 50 ug via INTRAVENOUS
  Filled 2016-04-13: qty 2

## 2016-04-13 MED ORDER — OXYBUTYNIN CHLORIDE 5 MG PO TABS
5.0000 mg | ORAL_TABLET | Freq: Every day | ORAL | Status: DC
  Administered 2016-04-13 – 2016-04-16 (×4): 5 mg via ORAL
  Filled 2016-04-13 (×5): qty 1

## 2016-04-13 MED ORDER — INSULIN ASPART 100 UNIT/ML ~~LOC~~ SOLN
0.0000 [IU] | Freq: Three times a day (TID) | SUBCUTANEOUS | Status: DC
  Administered 2016-04-13 – 2016-04-14 (×3): 1 [IU] via SUBCUTANEOUS

## 2016-04-13 MED ORDER — INFLUENZA VAC SPLIT QUAD 0.5 ML IM SUSY
0.5000 mL | PREFILLED_SYRINGE | INTRAMUSCULAR | Status: AC
  Administered 2016-04-14: 0.5 mL via INTRAMUSCULAR
  Filled 2016-04-13: qty 0.5

## 2016-04-13 MED ORDER — METOPROLOL TARTRATE 50 MG PO TABS
50.0000 mg | ORAL_TABLET | Freq: Two times a day (BID) | ORAL | Status: DC
  Administered 2016-04-13 – 2016-04-16 (×7): 50 mg via ORAL
  Filled 2016-04-13 (×7): qty 1

## 2016-04-13 MED ORDER — MORPHINE SULFATE (PF) 4 MG/ML IV SOLN
4.0000 mg | Freq: Once | INTRAVENOUS | Status: AC
Start: 2016-04-13 — End: 2016-04-13
  Administered 2016-04-13: 4 mg via INTRAVENOUS
  Filled 2016-04-13: qty 1

## 2016-04-13 MED ORDER — ONDANSETRON HCL 4 MG/2ML IJ SOLN
4.0000 mg | Freq: Once | INTRAMUSCULAR | Status: AC
  Administered 2016-04-13: 4 mg via INTRAVENOUS
  Filled 2016-04-13: qty 2

## 2016-04-13 MED ORDER — SODIUM CHLORIDE 0.9 % IV SOLN
INTRAVENOUS | Status: DC
  Administered 2016-04-13 – 2016-04-14 (×3): via INTRAVENOUS

## 2016-04-13 MED ORDER — ONDANSETRON HCL 4 MG/2ML IJ SOLN: 4.0000 mg | Freq: Four times a day (QID) | INTRAMUSCULAR | Status: DC | PRN

## 2016-04-13 MED ORDER — SODIUM CHLORIDE 0.9 % IV SOLN
INTRAVENOUS | Status: DC
  Administered 2016-04-13: 11:00:00 via INTRAVENOUS

## 2016-04-13 MED ORDER — GABAPENTIN 100 MG PO CAPS
100.0000 mg | ORAL_CAPSULE | Freq: Every day | ORAL | Status: DC
  Administered 2016-04-13 – 2016-04-15 (×3): 100 mg via ORAL
  Filled 2016-04-13 (×3): qty 1

## 2016-04-13 MED ORDER — SODIUM CHLORIDE 0.9% FLUSH
3.0000 mL | Freq: Two times a day (BID) | INTRAVENOUS | Status: DC
  Administered 2016-04-13 – 2016-04-15 (×3): 3 mL via INTRAVENOUS

## 2016-04-13 MED ORDER — ACETAMINOPHEN 325 MG PO TABS
650.0000 mg | ORAL_TABLET | Freq: Four times a day (QID) | ORAL | Status: DC | PRN
  Administered 2016-04-14: 650 mg via ORAL
  Filled 2016-04-13: qty 2

## 2016-04-13 MED ORDER — POLYETHYLENE GLYCOL 3350 17 G PO PACK
17.0000 g | PACK | Freq: Every day | ORAL | Status: DC | PRN
Start: 2016-04-13 — End: 2016-04-16

## 2016-04-13 MED ORDER — CEFTRIAXONE SODIUM 1 G IJ SOLR
1.0000 g | Freq: Once | INTRAMUSCULAR | Status: AC
  Administered 2016-04-13: 1 g via INTRAVENOUS
  Filled 2016-04-13: qty 10

## 2016-04-13 MED ORDER — GABAPENTIN 300 MG PO CAPS
300.0000 mg | ORAL_CAPSULE | Freq: Three times a day (TID) | ORAL | Status: DC
  Administered 2016-04-13 – 2016-04-16 (×10): 300 mg via ORAL
  Filled 2016-04-13 (×10): qty 1

## 2016-04-13 MED ORDER — FENTANYL CITRATE (PF) 100 MCG/2ML IJ SOLN: 50.0000 ug | INTRAMUSCULAR | Status: DC | PRN

## 2016-04-13 MED ORDER — DEXTROSE 5 % IV SOLN
1.0000 g | INTRAVENOUS | Status: DC
  Administered 2016-04-13 – 2016-04-15 (×3): 1 g via INTRAVENOUS
  Filled 2016-04-13 (×4): qty 10

## 2016-04-13 MED ORDER — DILTIAZEM HCL ER COATED BEADS 180 MG PO CP24
180.0000 mg | ORAL_CAPSULE | Freq: Every day | ORAL | Status: DC
  Administered 2016-04-13 – 2016-04-16 (×4): 180 mg via ORAL
  Filled 2016-04-13 (×5): qty 1

## 2016-04-13 MED ORDER — SODIUM CHLORIDE 0.9 % IV BOLUS (SEPSIS)
1000.0000 mL | Freq: Once | INTRAVENOUS | Status: AC
  Administered 2016-04-13: 1000 mL via INTRAVENOUS

## 2016-04-13 MED ORDER — ACETAMINOPHEN 650 MG RE SUPP: 650.0000 mg | Freq: Four times a day (QID) | RECTAL | Status: DC | PRN

## 2016-04-13 MED ORDER — POTASSIUM CHLORIDE 20 MEQ/15ML (10%) PO SOLN
20.0000 meq | Freq: Every day | ORAL | Status: DC
  Administered 2016-04-13 – 2016-04-14 (×2): 20 meq via ORAL
  Filled 2016-04-13 (×2): qty 15

## 2016-04-13 MED ORDER — PRAVASTATIN SODIUM 20 MG PO TABS
20.0000 mg | ORAL_TABLET | Freq: Every day | ORAL | Status: DC
  Administered 2016-04-13 – 2016-04-15 (×3): 20 mg via ORAL
  Filled 2016-04-13 (×3): qty 1

## 2016-04-13 MED ORDER — MORPHINE SULFATE (PF) 4 MG/ML IV SOLN: 2.0000 mg | INTRAVENOUS | Status: DC | PRN

## 2016-04-13 NOTE — ED Notes (Signed)
Pt placed on monitor. Pulses present in all extremities. CMS intact

## 2016-04-13 NOTE — H&P (Signed)
History and Physical    Robin Snow AVW:[id number] DOB: 07-04-1940 DOA: 04/13/2016  PCP: [name], MD Patient coming from: Home Chief Complaint: Abdominal pain HPI: Robin Snow is a 76 y.o. female with medical history significant of Afib on anticoagulation therapy with Coumadin, historical diagnosis of DM type II - not on any meds now, GERD, embolic stroke, HTN, HLD who presented to the ED with c/o constant left sided abdominal apin since yesterday. She also c/o nausea, vomiting, loose stool on two occasions and urinary frequency. She describes the pain as nagging currently and denied any dysuria, haematuria, melena or hematochezia  ED Course: On arrival to the Ed patient had stable VS, except mildly elevated blood pressure. Blood work showed abnormal renal function with creatinine of 1.44, urine was dirty demonstrating large amount of bacteria and WBC's, but nitrates were negative She underwent abdominal CT that showed finding  Potentially d/t acute emboli for acute left kidney infarction d/t acute emboli and possible pyelonephritis. She was given Cefriaxone 1 gram IV in the ED. Vascular surgery was contacted by ED physician d/t abnormal abdominal CT and Dr. Darrick Penna will see later tiday.  Review of Systems: As per HPI otherwise 10 point review of systems negative.   Ambulatory Status: independent  Past Medical History:  Diagnosis Date  . Anemia   . Anticoagulant drug declined    due to hx GI bleeding  . Arthritis   . Atrial fibrillation (HCC)   . Coagulopathy (HCC)   . Diabetes mellitus    Type 2  . Diverticulosis   . GERD (gastroesophageal reflux disease)   . GI bleed   . Hx of transfusion of packed red blood cells   . Hypertension   . Pneumonia    2-3 years ago  . Stroke [facility])     Past Surgical History:  Procedure Laterality Date  . CESAREAN SECTION    . COLONOSCOPY Left 01/24/2013   Procedure: COLONOSCOPY;  Surgeon: Willis Modena, MD;  Location: [facility]  ENDOSCOPY;  Service: Endoscopy;  Laterality: Left;  . ESOPHAGOGASTRODUODENOSCOPY Left 01/23/2013   Procedure: ESOPHAGOGASTRODUODENOSCOPY (EGD);  Surgeon: Willis Modena, MD;  Location[facility] ENDOSCOPY;  Service: Endoscopy;  Laterality: Left;  . EYE SURGERY Bilateral    cataract surgery  . JOINT REPLACEMENT    . REPLACEMENT TOTAL KNEE BILATERAL    . TUBAL LIGATION      Social History   Social History  . Marital status: Widowed    Spouse name: [name]  . Number of children: 4  . Years of education: 12   Occupational History  . Not on file.   Social History Main Topics  . Smoking status: Former Smoker    Types: Cigarettes    Quit date: 07/07/1993  . Smokeless tobacco: Never Used     Comment: .3 cig a day  . Alcohol use No     Comment: no longer drinks alcohol  . Drug use: No  . Sexual activity: Not on file   Other Topics Concern  . Not on file   Social History Narrative   Patient is widowed and has 4 living children, and 2 deceased children.   Patient is right handed.   Patient has hs education.   Patient drinks coffee and soda once a week.    Allergies  Allergen Reactions  . Aspirin Hives  . Hydrocodone Hives    Tolerates oxycodone and tramadol  . Rofecoxib Hives    Family History  Problem Relation Age of Onset  .  Cancer Father   . Stroke Maternal Aunt     Prior to Admission medications   Medication Sig Start Date End Date Taking? Authorizing Provider  CARTIA XT 180 MG 24 hr capsule TAKE 1 CAPSULE EVERY DAY 01/20/16  Yes Myrlene BrokerElizabeth A Crawford, MD  furosemide (LASIX) 80 MG tablet Take 1 tablet (80 mg total) by mouth daily as needed for fluid. 10/23/15  Yes Myrlene BrokerElizabeth A Crawford, MD  gabapentin (NEURONTIN) 100 MG capsule TAKE 1 CAPSULE EVERY NIGHT AT BEDTIME 10/15/15  Yes Myrlene BrokerElizabeth A Crawford, MD  gabapentin (NEURONTIN) 300 MG capsule TAKE 1 CAPSULE THREE TIMES DAILY 06/20/15  Yes Myrlene BrokerElizabeth A Crawford, MD  hydrochlorothiazide (MICROZIDE) 12.5 MG capsule TAKE 1 CAPSULE  (12.5 MG TOTAL) BY MOUTH DAILY. 06/20/15  Yes Myrlene BrokerElizabeth A Crawford, MD  lisinopril (PRINIVIL,ZESTRIL) 40 MG tablet Take 1 tablet (40 mg total) by mouth daily. Please keep scheduled appt on 01/31/16 @3 :30 pm for further refills. 12/03/15  Yes Kathleene Hazelhristopher D McAlhany, MD  lovastatin (MEVACOR) 20 MG tablet TAKE 1 TABLET EVERY NIGHT AT BEDTIME 10/01/15  Yes Myrlene BrokerElizabeth A Crawford, MD  meclizine (ANTIVERT) 25 MG tablet Take 1 tablet (25 mg total) by mouth every 4 (four) hours as needed for dizziness. 04/04/16  Yes Nelwyn SalisburyStephen A Fry, MD  metoprolol tartrate (LOPRESSOR) 25 MG tablet Take 2 tablets (50 mg total) by mouth 2 (two) times daily. 04/04/16  Yes Nelwyn SalisburyStephen A Fry, MD  oxybutynin (DITROPAN) 5 MG tablet TAKE 1 TABLET EVERY DAY 06/20/15  Yes Myrlene BrokerElizabeth A Crawford, MD  potassium chloride (KLOR-CON) 20 MEQ packet Take 20 mEq by mouth daily.   Yes Historical Provider, MD  warfarin (COUMADIN) 5 MG tablet Take 1 tablet (5 mg total) by mouth daily. Patient taking differently: Take 2.5-5 mg by mouth daily. Take 2.5mg  on Monday, Wednesday and Friday. All other days take 5mg . 03/05/16  Yes Myrlene BrokerElizabeth A Crawford, MD    Physical Exam: Vitals:   04/13/16 0530 04/13/16 0600 04/13/16 0732 04/13/16 0825  BP: (!) 155/102 (!) 157/110 (!) 150/108   Pulse: 63 97 80   Resp:   12   Temp:    98.1 F (36.7 C)  TempSrc:    Oral  SpO2: 97% 96% 98%   Weight:      Height:         General: Appears uncomfortable with left flunk pain Eyes: PERRLA, EOMI, normal lids, iris ENT:  grossly normal hearing, lips & tongue, mucous membranes moist and intact Neck: no lymphoadenopathy, masses or thyromegaly Cardiovascular: RRR, no m/r/g. No JVD, carotid bruits. Large pretibial and pedal LE edema.  Respiratory: bilateral diminished BS, no wheezes, rales, rhonchi or cracles. Normal respiratory effort. No accessory muscle use observed Abdomen: soft, non-tender, non-distended, no organomegaly or masses appreciated. BS present in all  quadrants Skin: no rash, ulcers or induration seen on limited exam Musculoskeletal: grossly normal tone BUE/BLE, good ROM, no bony abnormality or joint deformities observed Psychiatric: grossly normal mood and affect, speech fluent and appropriate, alert and oriented x3 Neurologic: CN II-XII grossly intact, moves all extremities in coordinated fashion, sensation intact  Labs on Admission: I have personally reviewed following labs and imaging studies  CBC, BMP  GFR: Estimated Creatinine Clearance: 46.2 mL/min (by C-G formula based on SCr of 1.44 mg/dL (H)).   Creatinine Clearance: Estimated Creatinine Clearance: 46.2 mL/min (by C-G formula based on SCr of 1.44 mg/dL (H)).    Radiological Exams on Admission: Ct Abdomen Pelvis W Contrast  Addendum Date: 04/13/2016   ADDENDUM REPORT:  04/13/2016 07:48 ADDENDUM: Critical Value/emergent results were called by telephone at the time of interpretation on 04/13/2016 at 7:48 am to Dr. Rochele Raring , who verbally acknowledged these results. Electronically Signed   By: Lupita Raider, M.D.   On: 04/13/2016 07:48   Result Date: 04/13/2016 CLINICAL DATA:  Acute left lower quadrant abdominal pain.  Vomiting. EXAM: CT ABDOMEN AND PELVIS WITH CONTRAST TECHNIQUE: Multidetector CT imaging of the abdomen and pelvis was performed using the standard protocol following bolus administration of intravenous contrast. CONTRAST:  75 mL of Isovue 370 intravenously. COMPARISON:  CT scan of December 02, 2011. FINDINGS: Lower chest: No acute abnormality. Hepatobiliary: No focal liver abnormality is seen. Status post cholecystectomy. No biliary dilatation. Pancreas: Unremarkable. No pancreatic ductal dilatation or surrounding inflammatory changes. Spleen: Normal in size without focal abnormality. Adrenals/Urinary Tract: Adrenal glands appear normal. Cortical scarring is seen involving the upper pole the right kidney. No hydronephrosis or renal obstruction is noted. Urinary bladder  is unremarkable. Small nonobstructive left renal calculus is noted. There are is large area of low density seen involving the left kidney, which does not fill in on delayed images. These findings are highly concerning for renal infarction, or less likely infection. No contrast filling of the left renal collecting system or ureter is noted, even on delayed images. Stomach/Bowel: Stomach is within normal limits. Appendix appears normal. No evidence of bowel wall thickening, distention, or inflammatory changes. Diverticulosis is noted in the descending and sigmoid colon without inflammation. Vascular/Lymphatic: Aortic atherosclerosis. No enlarged abdominal or pelvic lymph nodes. Reproductive: Uterus and bilateral adnexa are unremarkable. Other: No abdominal wall hernia or abnormality. No abdominopelvic ascites. Musculoskeletal: Stimulator leads are seen in the lower thoracic spinal canal. Mild degenerative changes are noted in the lower thoracic and upper lumbar spine. IMPRESSION: Large area of low density involving the left kidney on all sequences, even delayed images, with no contrast filling of the left renal collecting system or ureter. These findings are highly concerning for acute infarction of the left kidney, potentially due to acute embolus. Clinical correlation is recommended. Pyelonephritis is also a consideration, although considered less likely. Aortic atherosclerosis. Electronically Signed: By: Lupita Raider, M.D. On: 04/13/2016 07:29    EKG: Independently reviewed - Afib with controlled heart rate  Assessment/Plan Principal Problem:   Left flank pain Active Problems:   Atrial fibrillation (HCC)   GERD (gastroesophageal reflux disease)   Diabetes mellitus, type 2 (HCC)   Essential hypertension   HLD (hyperlipidemia)   Pyelonephritis   Left flank pain As mentioned above, patient has abnormal abdominal CT suspicious for possible left renal infarction due to renal artery embolus Vascular  surgeon is to see patient later today Will order MRI to define diagnosis We'll hold Coumadin and monitor INR, start heparin when INR is less than 2.0 and we'll ask pharmacy to manage We'll keep NPO after midnight if procedure is needed The possibility of left left pyelonephritis cannot be entirely excluded Urine was dirty and culture is pending, empirically the patient was placed on Rocephin which will be continued and, change if needed depending on urine culture result  Acute kidney injury - could be secondary to pyelonephritis and / or renal ischemia We'll give gentle hydration, hold ACE inhibitor and diuretics Monitor her renal function  Atrial fibrillation with controlled heart rate Continue oral Lopressor and Cardizem  Hold Coumadin Order 2D Echo to exclude intracardiac thrombus and reassess EF  DM type II - historical diagnosis, patient reported the PCP  stopped her diabetic meds Continue to monitor CBG's and start SSI If needed, keep on carb modified diet Check Hgb a1C   DVT prophylaxis: Coumadin Code Status: Full  Family Communication: At bedside Disposition Plan: Telemetry Consults called: vascular surgery called by ED physician Admission status: inpatient   Raymon Mutton, [place] Pager: [phone] [facility]  If 7PM-7AM, please contact night-coverage [email or web address] Password [email or web address]  04/13/2016, 9:29 AM

## 2016-04-13 NOTE — ED Notes (Signed)
Pt on bedside commode.

## 2016-04-13 NOTE — ED Provider Notes (Signed)
Today her By signing my name below, I, Modena Jansky, attest that this documentation has been prepared under the direction and in the presence of Chloe Baig N Adelyna Brockman, DO . Electronically Signed: Modena Jansky, Scribe. 04/13/2016. 3:08 AM.  TIME SEEN: 3:08 AM  CHIEF COMPLAINT: Abdominal pain  HPI:  HPI Comments: Robin Snow is a 75 y.o. female with atrial fibrillation on Coumadin, diabetes, HTN who presents to the Emergency Department complaining of constant moderate left-sided abdominal pain that started yesterday. She reports associated symptoms of nausea, vomiting, diarrhea, and urinary frequency. She denies any prior hx of UTI, hx of kidney stones, hx of abdominal surgeries, fever, chills, dysuria, hematuria, or other complaints.    PCP: [name], MD    ROS: See HPI Constitutional: no fever, no chills,   Eyes: no drainage  ENT: no runny nose   Cardiovascular:  no chest pain  Resp: no SOB  GI: +abdominal pain, +vomiting, +diarrhea, +nausea GU: +urinary frequency, no dysuria Integumentary: no rash  Allergy: no hives  Musculoskeletal: no leg swelling  Neurological: no slurred speech ROS otherwise negative  PAST MEDICAL HISTORY/PAST SURGICAL HISTORY:  Past Medical History:  Diagnosis Date  . Anemia   . Anticoagulant drug declined    due to hx GI bleeding  . Arthritis   . Atrial fibrillation (HCC)   . Coagulopathy (HCC)   . Diabetes mellitus    Type 2  . Diverticulosis   . GERD (gastroesophageal reflux disease)   . GI bleed   . Hx of transfusion of packed red blood cells   . Hypertension   . Pneumonia    2-3 years ago  . Stroke [facility])     MEDICATIONS:  Prior to Admission medications   Medication Sig Start Date End Date Taking? Authorizing Provider  CARTIA XT 180 MG 24 hr capsule TAKE 1 CAPSULE EVERY DAY 01/20/16   [name], MD  furosemide (LASIX) 80 MG tablet Take 1 tablet (80 mg total) by mouth daily as needed for fluid. 10/23/15   [name], MD  gabapentin (NEURONTIN) 100 MG capsule TAKE 1 CAPSULE EVERY NIGHT AT BEDTIME 10/15/15   [name], MD  gabapentin (NEURONTIN) 300 MG capsule TAKE 1 CAPSULE THREE TIMES DAILY 06/20/15   [name], MD  hydrochlorothiazide (MICROZIDE) 12.5 MG capsule TAKE 1 CAPSULE (12.5 MG TOTAL) BY MOUTH DAILY. 06/20/15   [name], MD  lisinopril (PRINIVIL,ZESTRIL) 40 MG tablet Take 1 tablet (40 mg total) by mouth daily. Please keep scheduled appt on 01/31/16 @3 :30 pm for further refills. 12/03/15   Kathleene Hazel, MD  lovastatin (MEVACOR) 20 MG tablet TAKE 1 TABLET EVERY NIGHT AT BEDTIME 10/01/15   [name], MD  meclizine (ANTIVERT) 25 MG tablet Take 1 tablet (25 mg total) by mouth every 4 (four) hours as needed for dizziness. 04/04/16   Nelwyn Salisbury, MD  metoprolol tartrate (LOPRESSOR) 25 MG tablet Take 2 tablets (50 mg total) by mouth 2 (two) times daily. 04/04/16   Nelwyn Salisbury, MD  oxybutynin (DITROPAN) 5 MG tablet TAKE 1 TABLET EVERY DAY 06/20/15   [name], MD  potassium chloride (KLOR-CON) 20 MEQ packet Take 20 mEq by mouth daily.    Historical Provider, MD  warfarin (COUMADIN) 5 MG tablet Take 1 tablet (5 mg total) by mouth daily. 03/05/16   [name], MD    ALLERGIES:  Allergies  Allergen Reactions  . Aspirin Hives  . Hydrocodone Hives  Tolerates oxycodone and tramadol  . Rofecoxib Hives    SOCIAL HISTORY:  Social History  Substance Use Topics  . Smoking status: Former Smoker    Types: Cigarettes    Quit date: 07/07/1993  . Smokeless tobacco: Never Used     Comment: .3 cig a day  . Alcohol use No     Comment: no longer drinks alcohol    FAMILY HISTORY: Family History  Problem Relation Age of Onset  . Cancer Father   . Stroke Maternal Aunt     EXAM: BP 151/97 (BP Location: Right Arm)   Pulse 94   Temp 98.7 F (37.1 C) (Oral)   Resp 20   Ht 5\' 7"  (1.702 m)   Wt 282 lb (127.9 kg)   SpO2 98%    BMI 44.17 kg/m  CONSTITUTIONAL: Alert and oriented and responds appropriately to questions. Well-appearing; well-nourished. Elderly.  HEAD: Normocephalic EYES: Conjunctivae clear, PERRL, EOMI ENT: normal nose; no rhinorrhea; moist mucous membranes NECK: Supple, no meningismus, no nuchal rigidity, no LAD  CARD: RRR; S1 and S2 appreciated; no murmurs, no clicks, no rubs, no gallops RESP: Normal chest excursion without splinting or tachypnea; breath sounds clear and equal bilaterally; no wheezes, no rhonchi, no rales, no hypoxia or respiratory distress, speaking full sentences ABD/GI: Normal bowel sounds; non-distended; soft, LLQ TTP with intermittent voluntary guarding, no rebound, no peritoneal signs, no hepatosplenomegaly BACK:  The back appears normal and is non-tender to palpation, there is no CVA tenderness EXT: Normal ROM in all joints; non-tender to palpation; nonpitting edema in bilateral feet which she reports is chronic, 2+ radial and DP pulses bilaterally, x-rays are warm and well-perfused; normal capillary refill; no cyanosis, no calf tenderness or swelling    SKIN: Normal color for age and race; warm; no rash NEURO: Moves all extremities equally, sensation to light touch intact diffusely, cranial nerves II through XII intact, normal speech PSYCH: The patient's mood and manner are appropriate. Grooming and personal hygiene are appropriate.  MEDICAL DECISION MAKING: Patient here with left lower quadrant pain. Differential diagnosis includes diverticulitis, colitis, UTI, kidney stone. Less likely bowel obstruction. Urine does appear infected. Culture pending. Given she is elderly and tender with some intermittent voluntary guarding will obtain CT scan for further evaluation. Will treat symptomatically with fentanyl, fluids, Zofran and reassess. Does have mild elevation of her creatinine from her baseline.  ED PROGRESS: 8:30 AM  Patient still having pain 6/10. We'll give another dose of IV  pain medication. CT scan shows infarcted left kidney. Discussed with Dr. Vernie Ammons with urology who reports there is nothing that they would do from a urologic perspective and recommends vascular surgery consult. Discussed with Dr. Darrick Penna with vascular surgery who states that there is nothing they would do acutely for an infarcted kidney. She has no other signs of embolic events at this time. Equal pulses in all extremities, extremities are warm and well-perfused, no focal neurologic deficits. She is currently anticoagulated with a therapeutic INR. He recommends admission for pain control, echocardiogram and he will see the patient in consult. We'll continue to treat as if this is pyelonephritis as well. Culture is pending. Appreciate vascular surgery help.  PCP is with [facility].  Will discuss with medicine for admission. Patient and daughter have been updated with this plan.     8:40 AM Discussed patient's case with Sallyanne Havers NP with hospitalist, Dr. Konrad Dolores is attending.  Recommend admission to inpatient, telemetry bed.  I will place holding orders per their request.  Patient and family (if present) updated with plan. Care transferred to hospitalist service.  I reviewed all nursing notes, vitals, pertinent old records, EKGs, labs, imaging (as available).    EKG Interpretation  Date/Time:  Monday April 13 2016 08:36:18 EST Ventricular Rate:  86 PR Interval:    QRS Duration: 89 QT Interval:  382 QTC Calculation: 457 R Axis:     Text Interpretation:  Atrial fibrillation Baseline wander in lead(s) V2 No significant change since last tracing Reconfirmed by Artavius Stearns,  DO, Gardy Montanari [phone]([id number] on 04/13/2016 8:41:18 AM        I personally performed the services described in this documentation, which was scribed in my presence. The recorded information has been reviewed and is accurate.      Layla MawKristen N Delesa Kawa, DO 04/13/16 [phone]

## 2016-04-13 NOTE — Progress Notes (Signed)
New Admission Note: transfer from ED  Arrival Method: stretcher Mental Orientation: a/o x4 Telemetry: placed Assessment: Completed Skin: clean dry intact IV:RFA NS 125 Pain: back 7/10, meds given prior to arrival Tubes:none Safety Measures: Safety Fall Prevention Plan has been given, discussed and signed Admission: Completed Unit Orientation: Patient has been orientated to the room, unit and staff.  Family: present upon arrival  Orders have been reviewed and implemented. Will continue to monitor the patient. Call light has been placed within reach and bed alarm has been activated.   Robin ForehandLuke Lucian Snow BSN, RN

## 2016-04-13 NOTE — Consult Note (Signed)
Referring Physician: Dr Elesa Massed, Tressie Ellis  ER  Patient name: Robin Snow MRN: [id number] DOB: 1940-05-21 Sex: female  REASON FOR CONSULT: left renal infarction  HPI: Robin Snow is a 76 y.o. female, with 24 hr history of left lower quadrant abdominal pain.  She denies flank or abdominal pain.  No vomiting. Did have some nausea and feeling of constipation.  This was all slow and not acute in onset.  She had a few episodes of diarrhea a few days ago.  She denies fever or chills.  She does have history of afib and is on coumadin. She also has history of diverticulosis.  Other medical problems include diabetes, hypertension, GI bleed all of which are stable.  Past Medical History:  Diagnosis Date  . Anemia   . Anticoagulant drug declined    due to hx GI bleeding  . Arthritis   . Atrial fibrillation (HCC)   . Coagulopathy (HCC)   . Diabetes mellitus    Type 2  . Diverticulosis   . GERD (gastroesophageal reflux disease)   . GI bleed   . Hx of transfusion of packed red blood cells   . Hypertension   . Pneumonia    2-3 years ago  . Stroke [facility])    Past Surgical History:  Procedure Laterality Date  . CESAREAN SECTION    . COLONOSCOPY Left 01/24/2013   Procedure: COLONOSCOPY;  Surgeon: Willis Modena, MD;  Location: [facility] ENDOSCOPY;  Service: Endoscopy;  Laterality: Left;  . ESOPHAGOGASTRODUODENOSCOPY Left 01/23/2013   Procedure: ESOPHAGOGASTRODUODENOSCOPY (EGD);  Surgeon: Willis Modena, MD;  Location: [facility] ENDOSCOPY;  Service: Endoscopy;  Laterality: Left;  . EYE SURGERY Bilateral    cataract surgery  . JOINT REPLACEMENT    . REPLACEMENT TOTAL KNEE BILATERAL    . TUBAL LIGATION      Family History  Problem Relation Age of Onset  . Cancer Father   . Stroke Maternal Aunt     SOCIAL HISTORY: Social History   Social History  . Marital status: Widowed    Spouse name: [name]  . Number of children: 4  . Years of education: 12   Occupational History  . Not on file.   Social  History Main Topics  . Smoking status: Former Smoker    Types: Cigarettes    Quit date: 07/07/1993  . Smokeless tobacco: Never Used     Comment: .3 cig a day  . Alcohol use No     Comment: no longer drinks alcohol  . Drug use: No  . Sexual activity: Not on file   Other Topics Concern  . Not on file   Social History Narrative   Patient is widowed and has 4 living children, and 2 deceased children.   Patient is right handed.   Patient has hs education.   Patient drinks coffee and soda once a week.    Allergies  Allergen Reactions  . Aspirin Hives  . Hydrocodone Hives    Tolerates oxycodone and tramadol  . Rofecoxib Hives    Current Facility-Administered Medications  Medication Dose Route Frequency Provider Last Rate Last Dose  . 0.9 %  sodium chloride infusion   Intravenous Continuous Kristen N Ward, DO 125 mL/hr at 04/13/16 4098    . ondansetron (ZOFRAN-ODT) 4 MG disintegrating tablet            Current Outpatient Prescriptions  Medication Sig Dispense Refill  . CARTIA XT 180 MG 24 hr capsule TAKE 1 CAPSULE EVERY DAY 90  capsule 3  . furosemide (LASIX) 80 MG tablet Take 1 tablet (80 mg total) by mouth daily as needed for fluid. 30 tablet 3  . gabapentin (NEURONTIN) 100 MG capsule TAKE 1 CAPSULE EVERY NIGHT AT BEDTIME 90 capsule 3  . gabapentin (NEURONTIN) 300 MG capsule TAKE 1 CAPSULE THREE TIMES DAILY 270 capsule 3  . hydrochlorothiazide (MICROZIDE) 12.5 MG capsule TAKE 1 CAPSULE (12.5 MG TOTAL) BY MOUTH DAILY. 90 capsule 3  . lisinopril (PRINIVIL,ZESTRIL) 40 MG tablet Take 1 tablet (40 mg total) by mouth daily. Please keep scheduled appt on 01/31/16 @3 :30 pm for further refills. 90 tablet 0  . lovastatin (MEVACOR) 20 MG tablet TAKE 1 TABLET EVERY NIGHT AT BEDTIME 90 tablet 2  . meclizine (ANTIVERT) 25 MG tablet Take 1 tablet (25 mg total) by mouth every 4 (four) hours as needed for dizziness. 60 tablet 0  . metoprolol tartrate (LOPRESSOR) 25 MG tablet Take 2 tablets (50 mg  total) by mouth 2 (two) times daily. 90 tablet 3  . oxybutynin (DITROPAN) 5 MG tablet TAKE 1 TABLET EVERY DAY 90 tablet 3  . potassium chloride (KLOR-CON) 20 MEQ packet Take 20 mEq by mouth daily.    [name] warfarin (COUMADIN) 5 MG tablet Take 1 tablet (5 mg total) by mouth daily. (Patient taking differently: Take 2.5-5 mg by mouth daily. Take 2.5mg  on Monday, Wednesday and Friday. All other days take 5mg .) 90 tablet 3    ROS:   General:  No weight loss, Fever, chills  HEENT: No recent headaches, no nasal bleeding, no visual changes, no sore throat  Neurologic: No dizziness, blackouts, seizures. No recent symptoms of stroke or mini- stroke. No recent episodes of slurred speech, or temporary blindness.  Cardiac: No recent episodes of chest pain/pressure, no shortness of breath at rest.  + shortness of breath with exertion.  + history of atrial fibrillation or irregular heartbeat  Vascular: No history of rest pain in feet.  No history of claudication.  No history of non-healing ulcer, No history of DVT   Pulmonary: No home oxygen, no productive cough, no hemoptysis,  No asthma or wheezing  Musculoskeletal:  [ ]  Arthritis, [ ]  Low back pain,  [ ]  Joint pain  Hematologic:No history of hypercoagulable state.  No history of easy bleeding.  No history of anemia  Gastrointestinal: No hematochezia or melena,  No gastroesophageal reflux, no trouble swallowing  Urinary: [ ]  chronic Kidney disease, [ ]  on HD - [ ]  MWF or [ ]  TTHS, [ ]  Burning with urination, [ ]  Frequent urination, [ ]  Difficulty urinating;   Skin: No rashes  Psychological: No history of anxiety,  No history of depression   Physical Examination  Vitals:   04/13/16 0530 04/13/16 0600 04/13/16 0732 04/13/16 0825  BP: (!) 155/102 (!) 157/110 (!) 150/108   Pulse: 63 97 80   Resp:   12   Temp:    98.1 F (36.7 C)  TempSrc:    Oral  SpO2: 97% 96% 98%   Weight:      Height:        Body mass index is 44.17 kg/m.  General:   Alert and oriented, no acute distress HEENT: Normal Neck: No bruit or JVD Pulmonary: Clear to auscultation bilaterally Cardiac: Regular Rate and Rhythm  Abdomen: Soft, non-tender, non-distended, no mass, no scars, obese, mild left lower quadrant pain on palpation no guarding or rebound Skin: No rash Musculoskeletal: No deformity or edema  Neurologic: Upper and lower extremity  motor 5/5 and symmetric  DATA:  CT scan abd pelvis. Poor contrast opacification of arterial or venous phase.  Some cortical changes of left kidney with possible decreased perfusion of the inferior pole but no good enough contrast opacification to assess renal artery or branches for embolic material.  BMET    Component Value Date/Time   NA 140 04/12/2016 2330   K 4.5 04/12/2016 2330   CL 104 04/12/2016 2330   CO2 26 04/12/2016 2330   GLUCOSE 145 (H) 04/12/2016 2330   BUN 9 04/12/2016 2330   CREATININE 1.44 (H) 04/12/2016 2330   CALCIUM 9.0 04/12/2016 2330   GFRNONAA 34 (L) 04/12/2016 2330   GFRAA 40 (L) 04/12/2016 2330    CBC    Component Value Date/Time   WBC 6.5 04/12/2016 2330   RBC 4.38 04/12/2016 2330   HGB 12.7 04/12/2016 2330   HCT 38.5 04/12/2016 2330   PLT 152 04/12/2016 2330   MCV 87.9 04/12/2016 2330   MCH 29.0 04/12/2016 2330   MCHC 33.0 04/12/2016 2330   RDW 15.0 04/12/2016 2330   LYMPHSABS 2.7 01/22/2014 1236   MONOABS 0.7 01/22/2014 1236   EOSABS 0.1 01/22/2014 1236   BASOSABS 0.1 01/22/2014 1236   INR 2.5  ASSESSMENT:  Possible left renal infarct versus other etiology of pain.  Suboptimal timing of contrast to really assess left kidney or left renal artery.  If pt has had left renal infarct, treatment is pain control.  No role for embolectomy and I am not convinced this is renal artery embolus.  Low likelihood of embolic event in a pt chronically on coumadin with therapeutic INR.   PLAN:  1.  Would obtain ECHO to rule out cardiac source of embolus 2.  Pain control    No further  follow up with vascular surgery needed.  Will sign off.   Fabienne Brunsharles Fields, MD [facility] [place] Office: [phone] Pager: [phone]

## 2016-04-13 NOTE — ED Notes (Signed)
Made attempts to gain IV access, unsuccessful. Ordering IV Team consult.

## 2016-04-13 NOTE — ED Notes (Signed)
EDP at bedside  

## 2016-04-14 LAB — COMPREHENSIVE METABOLIC PANEL
ALBUMIN: 3.2 g/dL — AB (ref 3.5–5.0)
ALT: 40 U/L (ref 14–54)
AST: 61 U/L — ABNORMAL HIGH (ref 15–41)
Alkaline Phosphatase: 79 U/L (ref 38–126)
Anion gap: 9 (ref 5–15)
BILIRUBIN TOTAL: 1.5 mg/dL — AB (ref 0.3–1.2)
BUN: 10 mg/dL (ref 6–20)
CALCIUM: 8.4 mg/dL — AB (ref 8.9–10.3)
CO2: 24 mmol/L (ref 22–32)
Chloride: 106 mmol/L (ref 101–111)
Creatinine, Ser: 1.68 mg/dL — ABNORMAL HIGH (ref 0.44–1.00)
GFR calc non Af Amer: 28 mL/min — ABNORMAL LOW (ref >60)
GFR, EST AFRICAN AMERICAN: 33 mL/min — AB (ref >60)
GLUCOSE: 133 mg/dL — AB (ref 65–99)
Potassium: 4.1 mmol/L (ref 3.5–5.1)
Sodium: 139 mmol/L (ref 135–145)
TOTAL PROTEIN: 7.2 g/dL (ref 6.5–8.1)

## 2016-04-14 LAB — CBC
HCT: 36.8 % (ref 36.0–46.0)
Hemoglobin: 12.3 g/dL (ref 12.0–15.0)
MCH: 29.2 pg (ref 26.0–34.0)
MCHC: 33.4 g/dL (ref 30.0–36.0)
MCV: 87.4 fL (ref 78.0–100.0)
PLATELETS: 141 10*3/uL — AB (ref 150–400)
RBC: 4.21 MIL/uL (ref 3.87–5.11)
RDW: 14.9 % (ref 11.5–15.5)
WBC: 21.4 10*3/uL — AB (ref 4.0–10.5)

## 2016-04-14 LAB — GLUCOSE, CAPILLARY
GLUCOSE-CAPILLARY: 134 mg/dL — AB (ref 65–99)
GLUCOSE-CAPILLARY: 132 mg/dL — AB (ref 65–99)
GLUCOSE-CAPILLARY: 104 mg/dL — AB (ref 65–99)
GLUCOSE-CAPILLARY: 122 mg/dL — AB (ref 65–99)

## 2016-04-14 LAB — PROTIME-INR
INR: 3
Prothrombin Time: 31.8 seconds — ABNORMAL HIGH (ref 11.4–15.2)

## 2016-04-14 LAB — HEMOGLOBIN A1C
Hgb A1c MFr Bld: 6.3 % — ABNORMAL HIGH (ref 4.8–5.6)
Mean Plasma Glucose: 134 mg/dL

## 2016-04-14 MED ORDER — WARFARIN - PHARMACIST DOSING INPATIENT
Freq: Every day | Status: DC
  Administered 2016-04-15: 18:00:00

## 2016-04-14 MED ORDER — ALUM & MAG HYDROXIDE-SIMETH 200-200-20 MG/5ML PO SUSP
30.0000 mL | ORAL | Status: DC | PRN
  Administered 2016-04-14: 30 mL via ORAL
  Filled 2016-04-14: qty 30

## 2016-04-14 NOTE — Progress Notes (Addendum)
PROGRESS NOTE    Robin Snow  RUE:[id number] DOB: [birth year] DOA: 04/13/2016 PCP: [name], MD     Brief Narrative:  Robin Snow is a 76 y.o. female with past medical history of atrial fibrillation on Coumadin, diabetes, hypertension, hyperlipidemia, GERD, presents with 24 hr history of left lower quadrant abdominal pain. Workup revealed possible left kidney infarction versus pyelonephritis on CAT scan. Vascular surgery was consulted and recommended supportive care.  Assessment & Plan:   Principal Problem:   Left flank pain Active Problems:   Atrial fibrillation (HCC)   GERD (gastroesophageal reflux disease)   Diabetes mellitus, type 2 (HCC)   Essential hypertension   HLD (hyperlipidemia)   Pyelonephritis   Diabetes mellitus with complication (HCC)   Renal infarction (HCC)  Left flank pain -Secondary to left renal infarct versus pyelonephritis -MRI unable to complete due to metal in body  -Appreciate vascular surgery input, supportive care and pain control for left renal infarct -Echocardiogram to rule out cardiac source of embolus   Sepsis secondary to E Coli pyelonephritis, POA -Leukocytosis worsening -Urine culture with E coli, sensitivity pending  -Continue Rocephin -IVF   Chronic atrial fibrillation, on Coumadin -Continue Coumadin, dosed per pharmacy -Continue cardizem, metoprolol   Acute kidney injury  -Baseline Cr 1  -Continue IV fluids -Trend BMP  DM type 2 -H1c 6.3 -SSI   Morbidly obese -BMI 44   DVT prophylaxis: coumadin Code Status: full Family Communication: no family at bedside  Disposition Plan: pending further improvement and treatment    Consultants:   Vascular surgery  Procedures:   None  Antimicrobials:   Rocephin     Subjective: Patient has no complaints today. She denies any abdominal or flank pain.  Objective: Vitals:   04/13/16 1043 04/13/16 1813 04/13/16 2011 04/14/16 0614  BP: (!) 149/72 (!)  145/80 (!) 147/88 (!) 148/85  Pulse: (!) 110 95 (!) 113 97  Resp: 15 18 18 18   Temp: 98.3 F (36.8 C) 98.5 F (36.9 C) 98.7 F (37.1 C) 99.9 F (37.7 C)  TempSrc: Oral Oral Oral Oral  SpO2: 95% 95% 93% 92%  Weight: 127.9 kg (282 lb)   129.6 kg (285 lb 11.5 oz)  Height: 5\' 7"  (1.702 m)       Intake/Output Summary (Last 24 hours) at 04/14/16 0911 Last data filed at 04/14/16 0600  Gross per 24 hour  Intake          3433.33 ml  Output                0 ml  Net          3433.33 ml   Filed Weights   04/12/16 2302 04/13/16 1043 04/14/16 0614  Weight: 127.9 kg (282 lb) 127.9 kg (282 lb) 129.6 kg (285 lb 11.5 oz)    Examination:  General exam: Appears calm and comfortable  Respiratory system: Clear to auscultation. Respiratory effort normal. Cardiovascular system: S1 & S2 heard, irreg rhythm. No JVD, murmurs, rubs, gallops or clicks. No pedal edema. Gastrointestinal system: Abdomen is nondistended, soft and nontender. No organomegaly or masses felt. Normal bowel sounds heard. Central nervous system: Alert and oriented. No focal neurological deficits. Extremities: Symmetric 5 x 5 power. Skin: No rashes, lesions or ulcers Psychiatry: Judgement and insight appear normal. Mood & affect appropriate.   Data Reviewed: I have personally reviewed following labs and imaging studies  CBC:  Recent Labs Lab 04/12/16 2330 04/14/16 0525  WBC 6.5 21.4*  HGB 12.7  12.3  HCT 38.5 36.8  MCV 87.9 87.4  PLT 152 141*   Basic Metabolic Panel:  Recent Labs Lab 04/12/16 2330 04/14/16 0525  NA 140 139  K 4.5 4.1  CL 104 106  CO2 26 24  GLUCOSE 145* 133*  BUN 9 10  CREATININE 1.44* 1.68*  CALCIUM 9.0 8.4*   GFR: Estimated Creatinine Clearance: 39.9 mL/min (by C-G formula based on SCr of 1.68 mg/dL (H)). Liver Function Tests:  Recent Labs Lab 04/12/16 2330 04/14/16 0525  AST 21 61*  ALT 19 40  ALKPHOS 94 79  BILITOT 0.7 1.5*  PROT 8.0 7.2  ALBUMIN 3.7 3.2*    Recent  Labs Lab 04/12/16 2330  LIPASE 14   No results for input(s): AMMONIA in the last 168 hours. Coagulation Profile:  Recent Labs Lab 04/10/16 04/13/16 0440 04/14/16 0525  INR 3.3 2.56 3.00   Cardiac Enzymes: No results for input(s): CKTOTAL, CKMB, CKMBINDEX, TROPONINI in the last 168 hours. BNP (last 3 results) No results for input(s): PROBNP in the last 8760 hours. HbA1C:  Recent Labs  04/13/16 1018  HGBA1C 6.3*   CBG:  Recent Labs Lab 04/13/16 1041 04/13/16 1702 04/13/16 2040 04/14/16 0741  GLUCAP 109* 122* 128* 134*   Lipid Profile: No results for input(s): CHOL, HDL, LDLCALC, TRIG, CHOLHDL, LDLDIRECT in the last 72 hours. Thyroid Function Tests: No results for input(s): TSH, T4TOTAL, FREET4, T3FREE, THYROIDAB in the last 72 hours. Anemia Panel: No results for input(s): VITAMINB12, FOLATE, FERRITIN, TIBC, IRON, RETICCTPCT in the last 72 hours. Sepsis Labs: No results for input(s): PROCALCITON, LATICACIDVEN in the last 168 hours.  Recent Results (from the past 240 hour(s))  Urine culture     Status: None (Preliminary result)   Collection Time: 04/12/16 11:30 PM  Result Value Ref Range Status   Specimen Description URINE, RANDOM  Final   Special Requests ADDED 0420  Final   Culture PENDING  Incomplete   Report Status PENDING  Incomplete       Radiology Studies: Ct Abdomen Pelvis W Contrast  Addendum Date: 04/13/2016   ADDENDUM REPORT: 04/13/2016 07:48 ADDENDUM: Critical Value/emergent results were called by telephone at the time of interpretation on 04/13/2016 at 7:48 am to Dr. Rochele RaringKRISTEN WARD , who verbally acknowledged these results. Electronically Signed   By: Lupita RaiderJames  Green Jr, M.D.   On: 04/13/2016 07:48   Result Date: 04/13/2016 CLINICAL DATA:  Acute left lower quadrant abdominal pain.  Vomiting. EXAM: CT ABDOMEN AND PELVIS WITH CONTRAST TECHNIQUE: Multidetector CT imaging of the abdomen and pelvis was performed using the standard protocol following bolus  administration of intravenous contrast. CONTRAST:  75 mL of Isovue 370 intravenously. COMPARISON:  CT scan of December 02, 2011. FINDINGS: Lower chest: No acute abnormality. Hepatobiliary: No focal liver abnormality is seen. Status post cholecystectomy. No biliary dilatation. Pancreas: Unremarkable. No pancreatic ductal dilatation or surrounding inflammatory changes. Spleen: Normal in size without focal abnormality. Adrenals/Urinary Tract: Adrenal glands appear normal. Cortical scarring is seen involving the upper pole the right kidney. No hydronephrosis or renal obstruction is noted. Urinary bladder is unremarkable. Small nonobstructive left renal calculus is noted. There are is large area of low density seen involving the left kidney, which does not fill in on delayed images. These findings are highly concerning for renal infarction, or less likely infection. No contrast filling of the left renal collecting system or ureter is noted, even on delayed images. Stomach/Bowel: Stomach is within normal limits. Appendix appears normal. No evidence of  bowel wall thickening, distention, or inflammatory changes. Diverticulosis is noted in the descending and sigmoid colon without inflammation. Vascular/Lymphatic: Aortic atherosclerosis. No enlarged abdominal or pelvic lymph nodes. Reproductive: Uterus and bilateral adnexa are unremarkable. Other: No abdominal wall hernia or abnormality. No abdominopelvic ascites. Musculoskeletal: Stimulator leads are seen in the lower thoracic spinal canal. Mild degenerative changes are noted in the lower thoracic and upper lumbar spine. IMPRESSION: Large area of low density involving the left kidney on all sequences, even delayed images, with no contrast filling of the left renal collecting system or ureter. These findings are highly concerning for acute infarction of the left kidney, potentially due to acute embolus. Clinical correlation is recommended. Pyelonephritis is also a  consideration, although considered less likely. Aortic atherosclerosis. Electronically Signed: By: Lupita Raider, M.D. On: 04/13/2016 07:29      Scheduled Meds: . cefTRIAXone (ROCEPHIN)  IV  1 g Intravenous Q24H  . diltiazem  180 mg Oral Daily  . gabapentin  100 mg Oral QHS  . gabapentin  300 mg Oral TID  . Influenza vac split quadrivalent PF  0.5 mL Intramuscular Tomorrow-1000  . insulin aspart  0-9 Units Subcutaneous TID WC  . metoprolol tartrate  50 mg Oral BID  . oxybutynin  5 mg Oral Daily  . potassium chloride  20 mEq Oral Daily  . pravastatin  20 mg Oral q1800  . sodium chloride flush  3 mL Intravenous Q12H   Continuous Infusions: . sodium chloride 125 mL/hr at 04/13/16 2125     LOS: 1 day    Time spent: 40 minutes   Noralee Stain, DO [facility] [email or web address] Password [email or web address] 04/14/2016, 9:11 AM

## 2016-04-14 NOTE — Progress Notes (Signed)
ANTICOAGULATION CONSULT NOTE - Initial Consult  Pharmacy Consult for warfarin Indication: atrial fibrillation  Allergies  Allergen Reactions  . Aspirin Hives  . Hydrocodone Hives    Tolerates oxycodone and tramadol  . Rofecoxib Hives    Patient Measurements: Height: 5\' 7"  (170.2 cm) Weight: 285 lb 11.5 oz (129.6 kg) IBW/kg (Calculated) : 61.6   Vital Signs: Temp: 98.9 F (37.2 C) (01/09 0900) Temp Source: Oral (01/09 0900) BP: 145/79 (01/09 0900) Pulse Rate: 100 (01/09 0900)  Labs:  Recent Labs  04/12/16 2330 04/13/16 0440 04/14/16 0525  HGB 12.7  --  12.3  HCT 38.5  --  36.8  PLT 152  --  141*  LABPROT  --  28.0* 31.8*  INR  --  2.56 3.00  CREATININE 1.44*  --  1.68*    Estimated Creatinine Clearance: 39.9 mL/min (by C-G formula based on SCr of 1.68 mg/dL (H)).  Assessment: Robin Snow with history of AFib on warfarin here with UTI vs pyelo and left flank pain. Warfarin was held for possible interventions, but no plans for procedures per vascular and to resume warfarin. PTA was on warfarin 5mg  daily except 2.5mg  on MWF. Despite not receiving doses since 1/7, INR today is at top end of range at 3. Hgb 12.3, plts 141- no bleeding noted.  Goal of Therapy:  INR 2-3 Monitor platelets by anticoagulation protocol: Yes   Plan:  -hold warfarin tonight -daily INR -follow for s/s bleeding  [name] D. Hong Moring, PharmD, BCPS Clinical Pharmacist Pager: [phone] 04/14/2016 2:06 PM

## 2016-04-15 ENCOUNTER — Inpatient Hospital Stay (HOSPITAL_COMMUNITY): Payer: Medicare HMO

## 2016-04-15 DIAGNOSIS — N12 Tubulo-interstitial nephritis, not specified as acute or chronic: Secondary | ICD-10-CM

## 2016-04-15 DIAGNOSIS — R109 Unspecified abdominal pain: Secondary | ICD-10-CM

## 2016-04-15 DIAGNOSIS — E1159 Type 2 diabetes mellitus with other circulatory complications: Secondary | ICD-10-CM

## 2016-04-15 DIAGNOSIS — N280 Ischemia and infarction of kidney: Secondary | ICD-10-CM

## 2016-04-15 LAB — CBC
HEMATOCRIT: 38.1 % (ref 36.0–46.0)
Hemoglobin: 12.8 g/dL (ref 12.0–15.0)
MCH: 29.4 pg (ref 26.0–34.0)
MCHC: 33.6 g/dL (ref 30.0–36.0)
MCV: 87.6 fL (ref 78.0–100.0)
PLATELETS: 127 10*3/uL — AB (ref 150–400)
RBC: 4.35 MIL/uL (ref 3.87–5.11)
RDW: 15.2 % (ref 11.5–15.5)
WBC: 22.5 10*3/uL — ABNORMAL HIGH (ref 4.0–10.5)

## 2016-04-15 LAB — BASIC METABOLIC PANEL
Anion gap: 7 (ref 5–15)
BUN: 16 mg/dL (ref 6–20)
CALCIUM: 8.5 mg/dL — AB (ref 8.9–10.3)
CO2: 25 mmol/L (ref 22–32)
CREATININE: 1.78 mg/dL — AB (ref 0.44–1.00)
Chloride: 105 mmol/L (ref 101–111)
GFR calc Af Amer: 31 mL/min — ABNORMAL LOW (ref >60)
GFR calc non Af Amer: 27 mL/min — ABNORMAL LOW (ref >60)
Glucose, Bld: 118 mg/dL — ABNORMAL HIGH (ref 65–99)
Potassium: 4.4 mmol/L (ref 3.5–5.1)
Sodium: 137 mmol/L (ref 135–145)

## 2016-04-15 LAB — GLUCOSE, CAPILLARY
GLUCOSE-CAPILLARY: 102 mg/dL — AB (ref 65–99)
Glucose-Capillary: 116 mg/dL — ABNORMAL HIGH (ref 65–99)
Glucose-Capillary: 108 mg/dL — ABNORMAL HIGH (ref 65–99)
Glucose-Capillary: 104 mg/dL — ABNORMAL HIGH (ref 65–99)

## 2016-04-15 LAB — URINE CULTURE: Culture: >=100000 — AB

## 2016-04-15 LAB — PROTIME-INR
INR: 2.82
Prothrombin Time: 30.3 seconds — ABNORMAL HIGH (ref 11.4–15.2)

## 2016-04-15 MED ORDER — FUROSEMIDE 80 MG PO TABS
80.0000 mg | ORAL_TABLET | Freq: Every day | ORAL | Status: DC
  Administered 2016-04-15 – 2016-04-16 (×2): 80 mg via ORAL
  Filled 2016-04-15 (×2): qty 1

## 2016-04-15 MED ORDER — WARFARIN SODIUM 2.5 MG PO TABS
2.5000 mg | ORAL_TABLET | Freq: Once | ORAL | Status: AC
  Administered 2016-04-15: 2.5 mg via ORAL
  Filled 2016-04-15: qty 1

## 2016-04-15 NOTE — Progress Notes (Signed)
PROGRESS NOTE        PATIENT DETAILS Name: Robin Snow Age: 76 y.o. Sex: female Date of Birth: 1940/08/07 Admit Date: 04/13/2016 Admitting Physician Ozella Rocks, MD WUJ:[id number] Alison Murray, MD  Brief Narrative: Patient is a 76 y.o. female with history of persistent atrial fibrillation on anticoagulation, prior history of CVA, obesity, hypertension, type 2 diabetes presented to the ED on 1/8 with complaints of left flank pain. CT of the abdomen showed probable left renal infarct. Interestingly, in early December, patient was switched from Eliquis to Coumadin due to financial issues. Her INR was subtherapeutic in late December.See below for further details  Subjective: Left flank pain has markedly improved.  Assessment/Plan: Left flank pain- Pyelonephritis vs Renal Infarct: Per patient this was of sudden onset-she denied any history of fever. No dysuria/frequency however UA consistent with UTI. Given recent subtherapeutic INR on 1/28, embolic renal infarct is definitely differentials. Spoke with hematology on call-Dr Shadad-he does not think this was a Coumadin failure, and attributes it to recent subtherapeutic INR. Recommends to continue Coumadin. Continue IV Rocephin for follow CBC-suspect that leukocytosis should improve with time, patient nontoxic appearing-with significantly less flank pain today. Await 2-D echocardiogram-but will likely not change management.  Acute kidney injury: Likely secondary to above-and in conjunction with lisinopril/HCTZ use, stop IV fluids-already has lower extremity edema. Avoid nephrotoxic agents. Follow renal function  Persistent atrial fibrillation: Rate controlled with Cardizem, anticoagulated with Coumadin.Chads2vasc of atleast 5  Hypertension: Controlled-continue metoprolol. Resume lisinopril and HCTZ when renal function improves further.  Chronic lower extremity edema: Weight slowly creeping up-resume Lasix. Follow  renal function, daily weights.   Type 2 diabetes: CBGs stable, continue SSI. A1c 6.3. Not in any oral hypoglycemic agents prior to this admission.  History of CVA 2015: Likely embolic given history of atrial fibrillation. No focal deficits. On Coumadin-INR currently therapeutic  Dyslipidemia: Continue statin  Morbid Obesity  DVT Prophylaxis: Full dose anticoagulation with coumadin  Code Status: Full code  Family Communication: Daughter at bedside  Disposition Plan: Remain inpatient-Home in 1-2 days  Antimicrobial agents: Anti-infectives    Start     Dose/Rate Route Frequency Ordered Stop   04/13/16 1000  cefTRIAXone (ROCEPHIN) 1 g in dextrose 5 % 50 mL IVPB     1 g 100 mL/hr over 30 Minutes Intravenous Every 24 hours 04/13/16 0955     04/13/16 0745  cefTRIAXone (ROCEPHIN) 1 g in dextrose 5 % 50 mL IVPB     1 g 100 mL/hr over 30 Minutes Intravenous  Once 04/13/16 0742 04/13/16 0844      Procedures: None  CONSULTS:  VVS  Shadad-phone consultation  Time spent: 25 minutes-Greater than 50% of this time was spent in counseling, explanation of diagnosis, planning of further management, and coordination of care.  MEDICATIONS: Scheduled Meds: . cefTRIAXone (ROCEPHIN)  IV  1 g Intravenous Q24H  . diltiazem  180 mg Oral Daily  . gabapentin  100 mg Oral QHS  . gabapentin  300 mg Oral TID  . insulin aspart  0-9 Units Subcutaneous TID WC  . metoprolol tartrate  50 mg Oral BID  . oxybutynin  5 mg Oral Daily  . pravastatin  20 mg Oral q1800  . sodium chloride flush  3 mL Intravenous Q12H  . warfarin  2.5 mg Oral ONCE-1800  . Warfarin - Pharmacist Dosing Inpatient  Does not apply q1800   Continuous Infusions: . sodium chloride 10 mL/hr at 04/15/16 0947   PRN Meds:.acetaminophen **OR** acetaminophen, alum & mag hydroxide-simeth, fentaNYL (SUBLIMAZE) injection, morphine injection, ondansetron **OR** ondansetron (ZOFRAN) IV, polyethylene glycol   PHYSICAL EXAM: Vital  signs: Vitals:   04/14/16 1843 04/14/16 2157 04/15/16 0502 04/15/16 1057  BP: (!) 150/80 134/75 (!) 143/85 (!) 168/86  Pulse: 98 100 100 98  Resp: 18 18 18 18   Temp: 99 F (37.2 C) 99.2 F (37.3 C) 99.6 F (37.6 C) 98.7 F (37.1 C)  TempSrc: Oral Oral Oral Oral  SpO2: 95% 98% 93% 96%  Weight:  130.5 kg (287 lb 11.2 oz)    Height:       Filed Weights   04/13/16 1043 04/14/16 0614 04/14/16 2157  Weight: 127.9 kg (282 lb) 129.6 kg (285 lb 11.5 oz) 130.5 kg (287 lb 11.2 oz)   Body mass index is 45.06 kg/m.   General appearance :Awake, alert, not in any distress. Speech Clear. Not toxic Looking Eyes:, pupils equally reactive to light and accomodation,no scleral icterus.Pink conjunctiva HEENT: Atraumatic and Normocephalic Neck: supple, no JVD. No cervical lymphadenopathy. No thyromegaly Resp:Good air entry bilaterally, no added sounds  CVS: S1 S2 irregular, no murmurs.  GI: Bowel sounds present, Non tender and not distended with no gaurding, rigidity or rebound.No organomegaly Extremities: B/L Lower Ext shows ++ edema, both legs are warm to touch Neurology:  speech clear,Non focal, sensation is grossly intact. Psychiatric: Normal judgment and insight. Alert and oriented x 3. Normal mood. Musculoskeletal:No digital cyanosis Skin:No Rash, warm and dry Wounds:[name]  I have personally reviewed following labs and imaging studies  LABORATORY DATA: CBC:  Recent Labs Lab 04/12/16 2330 04/14/16 0525 04/15/16 0703  WBC 6.5 21.4* 22.5*  HGB 12.7 12.3 12.8  HCT 38.5 36.8 38.1  MCV 87.9 87.4 87.6  PLT 152 141* 127*    Basic Metabolic Panel:  Recent Labs Lab 04/12/16 2330 04/14/16 0525 04/15/16 0703  NA 140 139 137  K 4.5 4.1 4.4  CL 104 106 105  CO2 26 24 25   GLUCOSE 145* 133* 118*  BUN 9 10 16   CREATININE 1.44* 1.68* 1.78*  CALCIUM 9.0 8.4* 8.5*    GFR: Estimated Creatinine Clearance: 37.9 mL/min (by C-G formula based on SCr of 1.78 mg/dL (H)).  Liver Function  Tests:  Recent Labs Lab 04/12/16 2330 04/14/16 0525  AST 21 61*  ALT 19 40  ALKPHOS 94 79  BILITOT 0.7 1.5*  PROT 8.0 7.2  ALBUMIN 3.7 3.2*    Recent Labs Lab 04/12/16 2330  LIPASE 14   No results for input(s): AMMONIA in the last 168 hours.  Coagulation Profile:  Recent Labs Lab 04/10/16 04/13/16 0440 04/14/16 0525 04/15/16 0703  INR 3.3 2.56 3.00 2.82    Cardiac Enzymes: No results for input(s): CKTOTAL, CKMB, CKMBINDEX, TROPONINI in the last 168 hours.  BNP (last 3 results) No results for input(s): PROBNP in the last 8760 hours.  HbA1C:  Recent Labs  04/13/16 1018  HGBA1C 6.3*    CBG:  Recent Labs Lab 04/14/16 1231 04/14/16 1719 04/14/16 2156 04/15/16 0743 04/15/16 1139  GLUCAP 132* 104* 122* 102* 116*    Lipid Profile: No results for input(s): CHOL, HDL, LDLCALC, TRIG, CHOLHDL, LDLDIRECT in the last 72 hours.  Thyroid Function Tests: No results for input(s): TSH, T4TOTAL, FREET4, T3FREE, THYROIDAB in the last 72 hours.  Anemia Panel: No results for input(s): VITAMINB12, FOLATE, FERRITIN, TIBC, IRON, RETICCTPCT in  the last 72 hours.  Urine analysis:    Component Value Date/Time   COLORURINE YELLOW 04/12/2016 2319   APPEARANCEUR HAZY (A) 04/12/2016 2319   LABSPEC 1.012 04/12/2016 2319   PHURINE 6.0 04/12/2016 2319   GLUCOSEU NEGATIVE 04/12/2016 2319   HGBUR NEGATIVE 04/12/2016 2319   BILIRUBINUR NEGATIVE 04/12/2016 2319   KETONESUR NEGATIVE 04/12/2016 2319   PROTEINUR 30 (A) 04/12/2016 2319   UROBILINOGEN 1.0 11/29/2013 0520   NITRITE NEGATIVE 04/12/2016 2319   LEUKOCYTESUR SMALL (A) 04/12/2016 2319    Sepsis Labs: Lactic Acid, Venous    Component Value Date/Time   LATICACIDVEN 1.8 05/30/2013 0615    MICROBIOLOGY: Recent Results (from the past 240 hour(s))  Urine culture     Status: Abnormal   Collection Time: 04/12/16 11:30 PM  Result Value Ref Range Status   Specimen Description URINE, RANDOM  Final   Special Requests  ADDED 0420  Final   Culture >=100,000 COLONIES/mL ESCHERICHIA COLI (A)  Final   Report Status 04/15/2016 FINAL  Final   Organism ID, Bacteria ESCHERICHIA COLI (A)  Final      Susceptibility   Escherichia coli - MIC*    AMPICILLIN >=32 RESISTANT Resistant     CEFAZOLIN <=4 SENSITIVE Sensitive     CEFTRIAXONE <=1 SENSITIVE Sensitive     CIPROFLOXACIN >=4 RESISTANT Resistant     GENTAMICIN <=1 SENSITIVE Sensitive     IMIPENEM <=0.25 SENSITIVE Sensitive     NITROFURANTOIN <=16 SENSITIVE Sensitive     TRIMETH/SULFA >=320 RESISTANT Resistant     AMPICILLIN/SULBACTAM 16 INTERMEDIATE Intermediate     PIP/TAZO <=4 SENSITIVE Sensitive     Extended ESBL NEGATIVE Sensitive     * >=100,000 COLONIES/mL ESCHERICHIA COLI    RADIOLOGY STUDIES/RESULTS: Ct Abdomen Pelvis W Contrast  Addendum Date: 04/13/2016   ADDENDUM REPORT: 04/13/2016 07:48 ADDENDUM: Critical Value/emergent results were called by telephone at the time of interpretation on 04/13/2016 at 7:48 am to Dr. Rochele Raring , who verbally acknowledged these results. Electronically Signed   By: Lupita Raider, M.D.   On: 04/13/2016 07:48   Result Date: 04/13/2016 CLINICAL DATA:  Acute left lower quadrant abdominal pain.  Vomiting. EXAM: CT ABDOMEN AND PELVIS WITH CONTRAST TECHNIQUE: Multidetector CT imaging of the abdomen and pelvis was performed using the standard protocol following bolus administration of intravenous contrast. CONTRAST:  75 mL of Isovue 370 intravenously. COMPARISON:  CT scan of December 02, 2011. FINDINGS: Lower chest: No acute abnormality. Hepatobiliary: No focal liver abnormality is seen. Status post cholecystectomy. No biliary dilatation. Pancreas: Unremarkable. No pancreatic ductal dilatation or surrounding inflammatory changes. Spleen: Normal in size without focal abnormality. Adrenals/Urinary Tract: Adrenal glands appear normal. Cortical scarring is seen involving the upper pole the right kidney. No hydronephrosis or renal  obstruction is noted. Urinary bladder is unremarkable. Small nonobstructive left renal calculus is noted. There are is large area of low density seen involving the left kidney, which does not fill in on delayed images. These findings are highly concerning for renal infarction, or less likely infection. No contrast filling of the left renal collecting system or ureter is noted, even on delayed images. Stomach/Bowel: Stomach is within normal limits. Appendix appears normal. No evidence of bowel wall thickening, distention, or inflammatory changes. Diverticulosis is noted in the descending and sigmoid colon without inflammation. Vascular/Lymphatic: Aortic atherosclerosis. No enlarged abdominal or pelvic lymph nodes. Reproductive: Uterus and bilateral adnexa are unremarkable. Other: No abdominal wall hernia or abnormality. No abdominopelvic ascites. Musculoskeletal: Stimulator leads are  seen in the lower thoracic spinal canal. Mild degenerative changes are noted in the lower thoracic and upper lumbar spine. IMPRESSION: Large area of low density involving the left kidney on all sequences, even delayed images, with no contrast filling of the left renal collecting system or ureter. These findings are highly concerning for acute infarction of the left kidney, potentially due to acute embolus. Clinical correlation is recommended. Pyelonephritis is also a consideration, although considered less likely. Aortic atherosclerosis. Electronically Signed: By: Lupita Raider, M.D. On: 04/13/2016 07:29     LOS: 2 days   Jeoffrey Massed, MD  [facility] Pager:[phone]  If 7PM-7AM, please contact night-coverage [email or web address] Password [email or web address] 04/15/2016, 2:14 PM

## 2016-04-15 NOTE — CV Procedure (Signed)
2D echo attempted but patient was having bath

## 2016-04-15 NOTE — Progress Notes (Signed)
ANTICOAGULATION CONSULT NOTE   Pharmacy Consult for warfarin Indication: atrial fibrillation  Allergies  Allergen Reactions  . Aspirin Hives  . Hydrocodone Hives    Tolerates oxycodone and tramadol  . Rofecoxib Hives    Patient Measurements: Height: 5\' 7"  (170.2 cm) Weight: 287 lb 11.2 oz (130.5 kg) IBW/kg (Calculated) : 61.6   Vital Signs: Temp: 99.6 F (37.6 C) (01/10 0502) Temp Source: Oral (01/10 0502) BP: 143/85 (01/10 0502) Pulse Rate: 100 (01/10 0502)  Labs:  Recent Labs  04/12/16 2330 04/13/16 0440 04/14/16 0525 04/15/16 0703  HGB 12.7  --  12.3 12.8  HCT 38.5  --  36.8 38.1  PLT 152  --  141* 127*  LABPROT  --  28.0* 31.8* 30.3*  INR  --  2.56 3.00 2.82  CREATININE 1.44*  --  1.68* 1.78*    Estimated Creatinine Clearance: 37.9 mL/min (by C-G formula based on SCr of 1.78 mg/dL (H)).  Assessment: [age] YOF with history of AFib on warfarin here with UTI vs pyelo and left flank pain. Warfarin was held for possible interventions, but no plans for procedures per vascular and to resume warfarin. PTA was on warfarin 5mg  daily except 2.5mg  on MWF.  INR today is 2.82. Hgb 12.8, plts 127- no bleeding noted.  Goal of Therapy:  INR 2-3 Monitor platelets by anticoagulation protocol: Yes   Plan:  -warfarin 2.5mg  po x1 tonight as per home dose -daily INR -follow for s/s bleeding -follow for any changes to anticoagulation plan (patient was on DOAC in the past but had issues with affordability. Noted INR has fluctuated on outpatient side)  Talayah Picardi D. Omega Durante, PharmD, BCPS Clinical Pharmacist Pager: [phone] 04/15/2016 10:40 AM

## 2016-04-16 ENCOUNTER — Inpatient Hospital Stay (HOSPITAL_COMMUNITY): Payer: Medicare HMO

## 2016-04-16 DIAGNOSIS — I480 Paroxysmal atrial fibrillation: Secondary | ICD-10-CM

## 2016-04-16 DIAGNOSIS — Z23 Encounter for immunization: Secondary | ICD-10-CM | POA: Diagnosis not present

## 2016-04-16 LAB — CBC
HCT: 32.8 % — ABNORMAL LOW (ref 36.0–46.0)
Hemoglobin: 11.5 g/dL — ABNORMAL LOW (ref 12.0–15.0)
MCH: 29.6 pg (ref 26.0–34.0)
MCHC: 35.1 g/dL (ref 30.0–36.0)
MCV: 84.5 fL (ref 78.0–100.0)
PLATELETS: 102 10*3/uL — AB (ref 150–400)
RBC: 3.88 MIL/uL (ref 3.87–5.11)
RDW: 14.4 % (ref 11.5–15.5)
WBC: 17.5 10*3/uL — AB (ref 4.0–10.5)

## 2016-04-16 LAB — BASIC METABOLIC PANEL
ANION GAP: 8 (ref 5–15)
BUN: 18 mg/dL (ref 6–20)
CO2: 23 mmol/L (ref 22–32)
Calcium: 8.1 mg/dL — ABNORMAL LOW (ref 8.9–10.3)
Chloride: 104 mmol/L (ref 101–111)
Creatinine, Ser: 1.61 mg/dL — ABNORMAL HIGH (ref 0.44–1.00)
GFR calc Af Amer: 35 mL/min — ABNORMAL LOW (ref >60)
GFR, EST NON AFRICAN AMERICAN: 30 mL/min — AB (ref >60)
Glucose, Bld: 106 mg/dL — ABNORMAL HIGH (ref 65–99)
POTASSIUM: 3.8 mmol/L (ref 3.5–5.1)
SODIUM: 135 mmol/L (ref 135–145)

## 2016-04-16 LAB — ECHOCARDIOGRAM COMPLETE
AOASC: 29 cm
CHL CUP REG VEL DIAS: 145 cm/s
CHL CUP TV REG PEAK VELOCITY: 331 cm/s
E decel time: 180 msec
EERAT: 10.96
FS: 33 % (ref 28–44)
HEIGHTINCHES: 67 in
IVS/LV PW RATIO, ED: 0.95
LA diam end sys: 40 mm
LA diam index: 1.57 cm/m2
LA vol: 130 mL
LASIZE: 40 mm
LAVOLA4C: 111 mL
LAVOLIN: 50.9 mL/m2
LDCA: 3.14 cm2
LV E/e' medial: 10.96
LV E/e'average: 10.96
LV PW d: 10.4 mm — AB (ref 0.6–1.1)
LV TDI E'MEDIAL: 9.79
LV e' LATERAL: 13.5 cm/s
LVOT SV: 44 mL
LVOT VTI: 13.9 cm
LVOT peak vel: 94 cm/s
LVOTD: 20 mm
Lateral S' vel: 16.6 cm/s
MV Dec: 180
MV Peak grad: 9 mmHg
MV pk E vel: 148 m/s
PV Reg grad dias: 8 mmHg
RV sys press: 52 mmHg
TAPSE: 22.9 mm
TDI e' lateral: 13.5
TR max vel: 331 cm/s
WEIGHTICAEL: 4599.68 [oz_av]

## 2016-04-16 LAB — PROTIME-INR
INR: 2.27
PROTHROMBIN TIME: 25.5 s — AB (ref 11.4–15.2)

## 2016-04-16 LAB — GLUCOSE, CAPILLARY
Glucose-Capillary: 98 mg/dL (ref 65–99)
Glucose-Capillary: 102 mg/dL — ABNORMAL HIGH (ref 65–99)

## 2016-04-16 MED ORDER — CEPHALEXIN 500 MG PO CAPS: 500.0000 mg | ORAL_CAPSULE | Freq: Two times a day (BID) | ORAL | 0 refills | Status: DC

## 2016-04-16 MED ORDER — WARFARIN SODIUM 5 MG PO TABS: 5.0000 mg | ORAL_TABLET | ORAL | Status: DC

## 2016-04-16 MED ORDER — METOPROLOL TARTRATE 50 MG PO TABS: 100.0000 mg | ORAL_TABLET | Freq: Two times a day (BID) | ORAL | 0 refills | Status: DC

## 2016-04-16 MED ORDER — WARFARIN SODIUM 2.5 MG PO TABS: 2.5000 mg | ORAL_TABLET | ORAL | Status: DC

## 2016-04-16 MED ORDER — CEPHALEXIN 500 MG PO CAPS
500.0000 mg | ORAL_CAPSULE | Freq: Two times a day (BID) | ORAL | Status: DC
  Administered 2016-04-16: 500 mg via ORAL
  Filled 2016-04-16: qty 1

## 2016-04-16 NOTE — Discharge Summary (Signed)
PATIENT DETAILS Name: Robin Snow Age: 76 y.o. Sex: female Date of Birth: [birth year] MRN: [id number]. Admitting Physician: Ozella Rocksavid J Merrell, MD WUJ:[id number]:Elizabeth Alison MurrayA Crawford, MD  Admit Date: 04/13/2016 Discharge date: 04/16/2016  Recommendations for Outpatient Follow-up:  1. Follow up with PCP in 1 week 2. Follow at the Coumadin clinic on 1/15 for an INR check 3. Please obtain BMP/CBC in one week 4. Lisinopril and HCTZ currently on hold-if renal function improves further-consider resuming. 5. Echocardiogram pending-please follow report   Admitted From:  Home   Disposition: Home   Home Health: No  Equipment/Devices: None  Discharge Condition: Stable  CODE STATUS: FULL CODE  Diet recommendation:  Heart Healthy / Carb Modified   Brief Summary: See H&P, Labs, Consult and Test reports for all details in brief, Patient is a 76 y.o. female with history of persistent atrial fibrillation on anticoagulation, prior history of CVA, obesity, hypertension, type 2 diabetes presented to the ED on 1/8 with complaints of left flank pain. CT of the abdomen showed probable left renal infarct vs pyelonephritis. Interestingly, in early December, patient was switched from Eliquis to Coumadin due to financial issues. Her INR was subtherapeutic in late December.See below for further details  Brief Hospital Course: Left flank pain- Pyelonephritis vs Renal Infarct: Per patient this was of sudden onset-she denied any history of fever. No dysuria/frequency however UA consistent with UTI. Given recent subtherapeutic INR on 1/28, embolic renal infarct is definitely in the differential. [facility]-he does not think this was a Coumadin failure, and attributes it to recent subtherapeutic INR. Recommends to continue Coumadin. In regards to possible pyelonephritis, she was maintained on IV Rocephin. She is clinically improved, with downtrending leukocytosis. Urine cultures  positive for Escherichia coli-she will be transitioned to Keflex on discharge. Note-she no longer has left-sided flank pain at the time of discharge. Echocardiogram has been done but is currently pending, she is already on Coumadin-and hence will likely not change management. PCP please follow echocardiogram report.  Acute kidney injury: Likely secondary to above-and in conjunction with lisinopril/HCTZ use, creatinine slowly down trending to 1.6 on the day of discharge. Continue to hold lisinopril and HCTZ. She has been resumed on Lasix did please continue to avoid nephrotoxic agents as an outpatient. Please repeat renal function in approximately 1 week time.  Persistent atrial fibrillation: Rate controlled with Cardizem and metoprolol, anticoagulated with Coumadin.Chads2vasc of atleast 5. She has been asked to follow-up at her Coumadin clinic on 1/15 for an INR check. Patient may need more frequent INR checks in the future-deferred to PCP.  Hypertension: Controlled-continue metoprolol but dosage increased 200 mg twice a day. Continue Cardizem. Resume lisinopril and HCTZ when renal function improves further.  Chronic lower extremity edema: Weight slowly creeping up-she has been resumed on Lasix. Follow renal function, weights at next follow-up with PCP.   Type 2 diabetes: CBGs with SSI. A1c 6.3. Not in any oral hypoglycemic agents prior to this admission.  History of CVA 2015: Likely embolic given history of atrial fibrillation. No focal deficits. On Coumadin-INR currently therapeutic  Dyslipidemia: Continue statin  Morbid Obesity  Procedures/Studies: Echo pending-please follow report  Discharge Diagnoses:  Principal Problem:   Left flank pain Active Problems:   Atrial fibrillation (HCC)   GERD (gastroesophageal reflux disease)   Diabetes mellitus, type 2 (HCC)   Essential hypertension   HLD (hyperlipidemia)   Pyelonephritis   Diabetes mellitus with complication (HCC)   Renal  infarction (HCC)  Discharge Instructions:  Activity:  As tolerated with Full fall precautions use walker/cane & assistance as needed  Discharge Instructions    (HEART FAILURE PATIENTS) Call MD:  Anytime you have any of the following symptoms: 1) 3 pound weight gain in 24 hours or 5 pounds in 1 week 2) shortness of breath, with or without a dry hacking cough 3) swelling in the hands, feet or stomach 4) if you have to sleep on extra pillows at night in order to breathe.    Complete by:  As directed    Call MD for:  difficulty breathing, headache or visual disturbances    Complete by:  As directed    Call MD for:  severe uncontrolled pain    Complete by:  As directed    Diet - low sodium heart healthy    Complete by:  As directed    Discharge instructions    Complete by:  As directed    Follow with Primary MD  [name], MD in 1 week  Follow at the coumadin clinic on 1/15 for INR check  Follow with your primary cardiologist in 2-3 weeks   Please get a complete blood count and chemistry panel checked by your Primary MD at your next visit, and again as instructed by your Primary MD.  Get Medicines reviewed and adjusted: Please take all your medications with you for your next visit with your Primary MD  Laboratory/radiological data: Please request your Primary MD to go over all hospital tests and procedure/radiological results at the follow up, please ask your Primary MD to get all Hospital records sent to his/her office.  In some cases, they will be blood work, cultures and biopsy results pending at the time of your discharge. Please request that your primary care M.D. follows up on these results.  Also Note the following: If you experience worsening of your admission symptoms, develop shortness of breath, life threatening emergency, suicidal or homicidal thoughts you must seek medical attention immediately by calling 911 or calling your MD immediately  if symptoms less  severe.  You must read complete instructions/literature along with all the possible adverse reactions/side effects for all the Medicines you take and that have been prescribed to you. Take any new Medicines after you have completely understood and accpet all the possible adverse reactions/side effects.   Do not drive when taking Pain medications or sleeping medications (Benzodaizepines)  Do not take more than prescribed Pain, Sleep and Anxiety Medications. It is not advisable to combine anxiety,sleep and pain medications without talking with your primary care practitioner  Special Instructions: If you have smoked or chewed Tobacco  in the last 2 yrs please stop smoking, stop any regular Alcohol  and or any Recreational drug use.  Wear Seat belts while driving.  Please note: You were cared for by a hospitalist during your hospital stay. Once you are discharged, your primary care physician will handle any further medical issues. Please note that NO REFILLS for any discharge medications will be authorized once you are discharged, as it is imperative that you return to your primary care physician (or establish a relationship with a primary care physician if you do not have one) for your post hospital discharge needs so that they can reassess your need for medications and monitor your lab values.   Increase activity slowly    Complete by:  As directed      Allergies as of 04/16/2016      Reactions  Aspirin Hives   Hydrocodone Hives   Tolerates oxycodone and tramadol   Rofecoxib Hives      Medication List    STOP taking these medications   hydrochlorothiazide 12.5 MG capsule Commonly known as:  MICROZIDE   lisinopril 40 MG tablet Commonly known as:  PRINIVIL,ZESTRIL     TAKE these medications   CARTIA XT 180 MG 24 hr capsule Generic drug:  diltiazem TAKE 1 CAPSULE EVERY DAY   cephALEXin 500 MG capsule Commonly known as:  KEFLEX Take 1 capsule (500 mg total) by mouth every 12  (twelve) hours.   furosemide 80 MG tablet Commonly known as:  LASIX Take 1 tablet (80 mg total) by mouth daily as needed for fluid.   gabapentin 300 MG capsule Commonly known as:  NEURONTIN TAKE 1 CAPSULE THREE TIMES DAILY   gabapentin 100 MG capsule Commonly known as:  NEURONTIN TAKE 1 CAPSULE EVERY NIGHT AT BEDTIME   lovastatin 20 MG tablet Commonly known as:  MEVACOR TAKE 1 TABLET EVERY NIGHT AT BEDTIME   meclizine 25 MG tablet Commonly known as:  ANTIVERT Take 1 tablet (25 mg total) by mouth every 4 (four) hours as needed for dizziness.   metoprolol 50 MG tablet Commonly known as:  LOPRESSOR Take 2 tablets (100 mg total) by mouth 2 (two) times daily. What changed:  medication strength  how much to take   oxybutynin 5 MG tablet Commonly known as:  DITROPAN TAKE 1 TABLET EVERY DAY   potassium chloride 20 MEQ packet Commonly known as:  KLOR-CON Take 20 mEq by mouth daily.   warfarin 5 MG tablet Commonly known as:  COUMADIN Take 1 tablet (5 mg total) by mouth daily. What changed:  how much to take  additional instructions      Follow-up Information    [name], MD. Schedule an appointment as soon as possible for a visit in 1 week(s).   Specialty:  Internal Medicine Contact information: [address] [place] [address] [phone]        Verne Carrow, MD. Schedule an appointment as soon as possible for a visit in 2 week(s).   Specialty:  Cardiology Contact information: [address] [place] [address] [phone]        [facility] [place] [facility] Follow up on 04/20/2016.   Why:  make appt at 1/15 for INR check         Allergies  Allergen Reactions  . Aspirin Hives  . Hydrocodone Hives    Tolerates oxycodone and tramadol  . Rofecoxib Hives    Consultations:   vascular surgery   Other Procedures/Studies: Ct Abdomen Pelvis W Contrast  Addendum Date: 04/13/2016   ADDENDUM REPORT:  04/13/2016 07:48 ADDENDUM: Critical Value/emergent results were called by telephone at the time of interpretation on 04/13/2016 at 7:48 am to Dr. Rochele Raring , who verbally acknowledged these results. Electronically Signed   By: Lupita Raider, M.D.   On: 04/13/2016 07:48   Result Date: 04/13/2016 CLINICAL DATA:  Acute left lower quadrant abdominal pain.  Vomiting. EXAM: CT ABDOMEN AND PELVIS WITH CONTRAST TECHNIQUE: Multidetector CT imaging of the abdomen and pelvis was performed using the standard protocol following bolus administration of intravenous contrast. CONTRAST:  75 mL of Isovue 370 intravenously. COMPARISON:  CT scan of December 02, 2011. FINDINGS: Lower chest: No acute abnormality. Hepatobiliary: No focal liver abnormality is seen. Status post cholecystectomy. No biliary dilatation. Pancreas: Unremarkable. No pancreatic ductal dilatation or  surrounding inflammatory changes. Spleen: Normal in size without focal abnormality. Adrenals/Urinary Tract: Adrenal glands appear normal. Cortical scarring is seen involving the upper pole the right kidney. No hydronephrosis or renal obstruction is noted. Urinary bladder is unremarkable. Small nonobstructive left renal calculus is noted. There are is large area of low density seen involving the left kidney, which does not fill in on delayed images. These findings are highly concerning for renal infarction, or less likely infection. No contrast filling of the left renal collecting system or ureter is noted, even on delayed images. Stomach/Bowel: Stomach is within normal limits. Appendix appears normal. No evidence of bowel wall thickening, distention, or inflammatory changes. Diverticulosis is noted in the descending and sigmoid colon without inflammation. Vascular/Lymphatic: Aortic atherosclerosis. No enlarged abdominal or pelvic lymph nodes. Reproductive: Uterus and bilateral adnexa are unremarkable. Other: No abdominal wall hernia or abnormality. No abdominopelvic  ascites. Musculoskeletal: Stimulator leads are seen in the lower thoracic spinal canal. Mild degenerative changes are noted in the lower thoracic and upper lumbar spine. IMPRESSION: Large area of low density involving the left kidney on all sequences, even delayed images, with no contrast filling of the left renal collecting system or ureter. These findings are highly concerning for acute infarction of the left kidney, potentially due to acute embolus. Clinical correlation is recommended. Pyelonephritis is also a consideration, although considered less likely. Aortic atherosclerosis. Electronically Signed: By: Lupita Raider, M.D. On: 04/13/2016 07:29      TODAY-DAY OF DISCHARGE:  Subjective:   Robin Snow today has no headache,no chest abdominal pain,no new weakness tingling or numbness, feels much better wants to go home today.  Objective:   Blood pressure 136/83, pulse 95, temperature 98.7 F (37.1 C), temperature source Oral, resp. rate 18, height 5\' 7"  (1.702 m), weight 130.4 kg (287 lb 7.7 oz), SpO2 97 %.  Intake/Output Summary (Last 24 hours) at 04/16/16 1110 Last data filed at 04/16/16 0900  Gross per 24 hour  Intake              360 ml  Output                0 ml  Net              360 ml   Filed Weights   04/14/16 0614 04/14/16 2157 04/15/16 2131  Weight: 129.6 kg (285 lb 11.5 oz) 130.5 kg (287 lb 11.2 oz) 130.4 kg (287 lb 7.7 oz)    Exam: Awake Alert, Oriented *3, No new F.N deficits, Normal affect Rudolph.AT,PERRAL Supple Neck,No JVD, No cervical lymphadenopathy appriciated.  Symmetrical Chest wall movement, Good air movement bilaterally, CTAB RRR,No Gallops,Rubs or new Murmurs, No Parasternal Heave +ve B.Sounds, Abd Soft, Non tender, No organomegaly appriciated, No rebound -guarding or rigidity. No Cyanosis, Clubbing or edema, No new Rash or bruise   PERTINENT RADIOLOGIC STUDIES: Ct Abdomen Pelvis W Contrast  Addendum Date: 04/13/2016   ADDENDUM REPORT: 04/13/2016  07:48 ADDENDUM: Critical Value/emergent results were called by telephone at the time of interpretation on 04/13/2016 at 7:48 am to Dr. Rochele Raring , who verbally acknowledged these results. Electronically Signed   By: Lupita Raider, M.D.   On: 04/13/2016 07:48   Result Date: 04/13/2016 CLINICAL DATA:  Acute left lower quadrant abdominal pain.  Vomiting. EXAM: CT ABDOMEN AND PELVIS WITH CONTRAST TECHNIQUE: Multidetector CT imaging of the abdomen and pelvis was performed using the standard protocol following bolus administration of intravenous contrast. CONTRAST:  75 mL of Isovue  370 intravenously. COMPARISON:  CT scan of December 02, 2011. FINDINGS: Lower chest: No acute abnormality. Hepatobiliary: No focal liver abnormality is seen. Status post cholecystectomy. No biliary dilatation. Pancreas: Unremarkable. No pancreatic ductal dilatation or surrounding inflammatory changes. Spleen: Normal in size without focal abnormality. Adrenals/Urinary Tract: Adrenal glands appear normal. Cortical scarring is seen involving the upper pole the right kidney. No hydronephrosis or renal obstruction is noted. Urinary bladder is unremarkable. Small nonobstructive left renal calculus is noted. There are is large area of low density seen involving the left kidney, which does not fill in on delayed images. These findings are highly concerning for renal infarction, or less likely infection. No contrast filling of the left renal collecting system or ureter is noted, even on delayed images. Stomach/Bowel: Stomach is within normal limits. Appendix appears normal. No evidence of bowel wall thickening, distention, or inflammatory changes. Diverticulosis is noted in the descending and sigmoid colon without inflammation. Vascular/Lymphatic: Aortic atherosclerosis. No enlarged abdominal or pelvic lymph nodes. Reproductive: Uterus and bilateral adnexa are unremarkable. Other: No abdominal wall hernia or abnormality. No abdominopelvic ascites.  Musculoskeletal: Stimulator leads are seen in the lower thoracic spinal canal. Mild degenerative changes are noted in the lower thoracic and upper lumbar spine. IMPRESSION: Large area of low density involving the left kidney on all sequences, even delayed images, with no contrast filling of the left renal collecting system or ureter. These findings are highly concerning for acute infarction of the left kidney, potentially due to acute embolus. Clinical correlation is recommended. Pyelonephritis is also a consideration, although considered less likely. Aortic atherosclerosis. Electronically Signed: By: Lupita Raider, M.D. On: 04/13/2016 07:29     PERTINENT LAB RESULTS: CBC:  Recent Labs  04/15/16 0703 04/16/16 0416  WBC 22.5* 17.5*  HGB 12.8 11.5*  HCT 38.1 32.8*  PLT 127* 102*   CMET CMP     Component Value Date/Time   NA 135 04/16/2016 0416   K 3.8 04/16/2016 0416   CL 104 04/16/2016 0416   CO2 23 04/16/2016 0416   GLUCOSE 106 (H) 04/16/2016 0416   BUN 18 04/16/2016 0416   CREATININE 1.61 (H) 04/16/2016 0416   CALCIUM 8.1 (L) 04/16/2016 0416   PROT 7.2 04/14/2016 0525   ALBUMIN 3.2 (L) 04/14/2016 0525   AST 61 (H) 04/14/2016 0525   ALT 40 04/14/2016 0525   ALKPHOS 79 04/14/2016 0525   BILITOT 1.5 (H) 04/14/2016 0525   GFRNONAA 30 (L) 04/16/2016 0416   GFRAA 35 (L) 04/16/2016 0416    GFR Estimated Creatinine Clearance: 41.8 mL/min (by C-G formula based on SCr of 1.61 mg/dL (H)). No results for input(s): LIPASE, AMYLASE in the last 72 hours. No results for input(s): CKTOTAL, CKMB, CKMBINDEX, TROPONINI in the last 72 hours. Invalid input(s): POCBNP No results for input(s): DDIMER in the last 72 hours. No results for input(s): HGBA1C in the last 72 hours. No results for input(s): CHOL, HDL, LDLCALC, TRIG, CHOLHDL, LDLDIRECT in the last 72 hours. No results for input(s): TSH, T4TOTAL, T3FREE, THYROIDAB in the last 72 hours.  Invalid input(s): FREET3 No results for  input(s): VITAMINB12, FOLATE, FERRITIN, TIBC, IRON, RETICCTPCT in the last 72 hours. Coags:  Recent Labs  04/15/16 0703 04/16/16 0416  INR 2.82 2.27   Microbiology: Recent Results (from the past 240 hour(s))  Urine culture     Status: Abnormal   Collection Time: 04/12/16 11:30 PM  Result Value Ref Range Status   Specimen Description URINE, RANDOM  Final  Special Requests ADDED [id number]  Final   Culture >=100,000 COLONIES/mL ESCHERICHIA COLI (A)  Final   Report Status 04/15/2016 FINAL  Final   Organism ID, Bacteria ESCHERICHIA COLI (A)  Final      Susceptibility   Escherichia coli - MIC*    AMPICILLIN >=32 RESISTANT Resistant     CEFAZOLIN <=4 SENSITIVE Sensitive     CEFTRIAXONE <=1 SENSITIVE Sensitive     CIPROFLOXACIN >=4 RESISTANT Resistant     GENTAMICIN <=1 SENSITIVE Sensitive     IMIPENEM <=0.25 SENSITIVE Sensitive     NITROFURANTOIN <=16 SENSITIVE Sensitive     TRIMETH/SULFA >=320 RESISTANT Resistant     AMPICILLIN/SULBACTAM 16 INTERMEDIATE Intermediate     PIP/TAZO <=4 SENSITIVE Sensitive     Extended ESBL NEGATIVE Sensitive     * >=100,000 COLONIES/mL ESCHERICHIA COLI    FURTHER DISCHARGE INSTRUCTIONS:  Get Medicines reviewed and adjusted: Please take all your medications with you for your next visit with your Primary MD  Laboratory/radiological data: Please request your Primary MD to go over all hospital tests and procedure/radiological results at the follow up, please ask your Primary MD to get all Hospital records sent to his/her office.  In some cases, they will be blood work, cultures and biopsy results pending at the time of your discharge. Please request that your primary care M.D. goes through all the records of your hospital data and follows up on these results.  Also Note the following: If you experience worsening of your admission symptoms, develop shortness of breath, life threatening emergency, suicidal or homicidal thoughts you must seek medical  attention immediately by calling 911 or calling your MD immediately  if symptoms less severe.  You must read complete instructions/literature along with all the possible adverse reactions/side effects for all the Medicines you take and that have been prescribed to you. Take any new Medicines after you have completely understood and accpet all the possible adverse reactions/side effects.   Do not drive when taking Pain medications or sleeping medications (Benzodaizepines)  Do not take more than prescribed Pain, Sleep and Anxiety Medications. It is not advisable to combine anxiety,sleep and pain medications without talking with your primary care practitioner  Special Instructions: If you have smoked or chewed Tobacco  in the last 2 yrs please stop smoking, stop any regular Alcohol  and or any Recreational drug use.  Wear Seat belts while driving.  Please note: You were cared for by a hospitalist during your hospital stay. Once you are discharged, your primary care physician will handle any further medical issues. Please note that NO REFILLS for any discharge medications will be authorized once you are discharged, as it is imperative that you return to your primary care physician (or establish a relationship with a primary care physician if you do not have one) for your post hospital discharge needs so that they can reassess your need for medications and monitor your lab values.  Total Time spent coordinating discharge including counseling, education and face to face time equals  45 minutes.  SignedJeoffrey Massed 04/16/2016 11:10 AM

## 2016-04-16 NOTE — Progress Notes (Signed)
Patient discharge teaching given, including activity, diet, follow-up appoints, and medications. Patient verbalized understanding of all discharge instructions. IV access was d/c'd. Vitals are stable. Skin is intact except as charted in most recent assessments. Pt to be escorted out by NT, to be driven home by family.  Nykeem Citro, MBA, BSN, RN 

## 2016-04-16 NOTE — Progress Notes (Signed)
ANTICOAGULATION CONSULT NOTE   Pharmacy Consult for warfarin Indication: atrial fibrillation  Allergies  Allergen Reactions  . Aspirin Hives  . Hydrocodone Hives    Tolerates oxycodone and tramadol  . Rofecoxib Hives    Patient Measurements: Height: 5\' 7"  (170.2 cm) Weight: 287 lb 7.7 oz (130.4 kg) IBW/kg (Calculated) : 61.6   Vital Signs: Temp: 98.4 F (36.9 C) (01/11 0455) Temp Source: Oral (01/11 0455) BP: 126/79 (01/11 0455) Pulse Rate: 97 (01/11 0455)  Labs:  Recent Labs  04/14/16 0525 04/15/16 0703 04/16/16 0416  HGB 12.3 12.8 11.5*  HCT 36.8 38.1 32.8*  PLT 141* 127* 102*  LABPROT 31.8* 30.3* 25.5*  INR 3.00 2.82 2.27  CREATININE 1.68* 1.78* 1.61*    Estimated Creatinine Clearance: 41.8 mL/min (by C-G formula based on SCr of 1.61 mg/dL (H)).  Assessment: [age] YOF with history of AFib on warfarin here with UTI vs pyelo and left flank pain. Warfarin was held for possible interventions, but no plans for procedures per vascular and to resume warfarin. PTA was on warfarin 5mg  daily except 2.5mg  on MWF.  INR today is 2.27. Hgb 11.5, plts 102- overall stable, no bleeding noted.  Goal of Therapy:  INR 2-3 Monitor platelets by anticoagulation protocol: Yes   Plan:  -resume home dose of warfarin- 5mg  daily except 2.5mg  on MWF -daily INR while in the hospital, recommend an INR check on Monday 1/15 if patient discharged today -follow for s/s bleeding  Chantee Cerino D. Elasha Tess, PharmD, BCPS Clinical Pharmacist Pager: [phone] 04/16/2016 9:58 AM

## 2016-04-16 NOTE — Progress Notes (Signed)
  Echocardiogram 2D Echocardiogram has been performed.  Marisue Humblelexis N Asti Mackley 04/16/2016, 10:13 AM

## 2016-04-17 ENCOUNTER — Telehealth: Payer: Self-pay

## 2016-04-17 DIAGNOSIS — I4891 Unspecified atrial fibrillation: Secondary | ICD-10-CM

## 2016-04-17 MED ORDER — METOPROLOL TARTRATE 50 MG PO TABS: 100.0000 mg | ORAL_TABLET | Freq: Two times a day (BID) | ORAL | 0 refills | Status: DC

## 2016-04-17 MED ORDER — CEPHALEXIN 500 MG PO CAPS: 500.0000 mg | ORAL_CAPSULE | Freq: Two times a day (BID) | ORAL | 0 refills | Status: AC

## 2016-04-17 NOTE — Telephone Encounter (Signed)
[facility] on [address]. Please remind her in the future it is important to keep track of her medication since antibiotics (keflex) are meant to start taking immediately when leaving the hospital and your health can be negatively impacted by not following the plan from the hospital.

## 2016-04-17 NOTE — Telephone Encounter (Signed)
Pt informed of rx and recommendation to start medication asap and the reason. Pt stated understanding.

## 2016-04-17 NOTE — Telephone Encounter (Signed)
Transition Care Management Follow-up Telephone Call   Date discharged? 04/15/2016   How have you been since you were released from the hospital? Pt is feeling better.    Do you understand why you were in the hospital? Pt stated that she understands why she was in the hospital.  Do you understand the discharge instructions? Pt states that she understands but stated that she does not have the prescriptions that she was sent home with.   Where were you discharged to? Home   Items Reviewed:  Medications reviewed: Yes  Allergies reviewed: Yes  Dietary changes reviewed: Yes  Referrals reviewed: n/a   Functional Questionnaire:   Activities of Daily Living (ADLs):   States they are independent in the following: Pt is independent in all ADL's States they require assistance with the following: [name]  Any transportation issues/concerns?: Not at this time.  Any patient concerns? Only the prescriptions that she does not have.   Confirmed importance and date/time of follow-up visits scheduled: Pt understands.  Provider Appointment booked with Okey Duprerawford for Monday 04/20/16  Confirmed with patient if condition begins to worsen call PCP or go to the ER.  Patient was given the office number and encouraged to call back with question or concerns: Yes

## 2016-04-17 NOTE — Telephone Encounter (Signed)
Pt stated that she does not have the prescriptions that the hospital sent her home with. She knows there were two of them and she is not sure where they have gone two. The medications: cephalexin and metoprolol.   Please advise if these can be sent in. Pt has a TCM follow with you on Monday 04/20/2016

## 2016-04-20 ENCOUNTER — Encounter: Payer: Self-pay | Admitting: Internal Medicine

## 2016-04-20 ENCOUNTER — Ambulatory Visit (INDEPENDENT_AMBULATORY_CARE_PROVIDER_SITE_OTHER): Payer: Medicare HMO | Admitting: Internal Medicine

## 2016-04-20 ENCOUNTER — Other Ambulatory Visit (INDEPENDENT_AMBULATORY_CARE_PROVIDER_SITE_OTHER): Payer: Medicare HMO

## 2016-04-20 VITALS — BP 110/64 | HR 71 | Temp 98.6°F | Resp 16 | Ht 67.0 in | Wt 273.0 lb

## 2016-04-20 DIAGNOSIS — I4821 Permanent atrial fibrillation: Secondary | ICD-10-CM

## 2016-04-20 DIAGNOSIS — N280 Ischemia and infarction of kidney: Secondary | ICD-10-CM

## 2016-04-20 DIAGNOSIS — N179 Acute kidney failure, unspecified: Secondary | ICD-10-CM

## 2016-04-20 DIAGNOSIS — I482 Chronic atrial fibrillation: Secondary | ICD-10-CM | POA: Diagnosis not present

## 2016-04-20 DIAGNOSIS — R109 Unspecified abdominal pain: Secondary | ICD-10-CM | POA: Diagnosis not present

## 2016-04-20 DIAGNOSIS — I10 Essential (primary) hypertension: Secondary | ICD-10-CM | POA: Diagnosis not present

## 2016-04-20 DIAGNOSIS — N12 Tubulo-interstitial nephritis, not specified as acute or chronic: Secondary | ICD-10-CM

## 2016-04-20 LAB — PROTIME-INR
INR: 1.9 ratio — AB (ref 0.8–1.0)
Prothrombin Time: 20.4 s — ABNORMAL HIGH (ref 9.6–13.1)

## 2016-04-20 LAB — COMPREHENSIVE METABOLIC PANEL
ALBUMIN: 3.1 g/dL — AB (ref 3.5–5.2)
ALK PHOS: 70 U/L (ref 39–117)
ALT: 15 U/L (ref 0–35)
AST: 14 U/L (ref 0–37)
BUN: 18 mg/dL (ref 6–23)
CO2: 29 mEq/L (ref 19–32)
Calcium: 8.7 mg/dL (ref 8.4–10.5)
Chloride: 105 mEq/L (ref 96–112)
Creatinine, Ser: 1.82 mg/dL — ABNORMAL HIGH (ref 0.40–1.20)
GFR: 34.68 mL/min — AB (ref >60.00)
Glucose, Bld: 94 mg/dL (ref 70–99)
POTASSIUM: 4.5 meq/L (ref 3.5–5.1)
Sodium: 139 mEq/L (ref 135–145)
TOTAL PROTEIN: 7 g/dL (ref 6.0–8.3)
Total Bilirubin: 0.6 mg/dL (ref 0.2–1.2)

## 2016-04-20 LAB — CBC
HCT: 32.4 % — ABNORMAL LOW (ref 36.0–46.0)
HEMOGLOBIN: 10.8 g/dL — AB (ref 12.0–15.0)
MCHC: 33.4 g/dL (ref 30.0–36.0)
MCV: 87.6 fl (ref 78.0–100.0)
PLATELETS: 206 10*3/uL (ref 150.0–400.0)
RBC: 3.7 Mil/uL — AB (ref 3.87–5.11)
RDW: 14.9 % (ref 11.5–15.5)
WBC: 7.8 10*3/uL (ref 4.0–10.5)

## 2016-04-20 NOTE — Assessment & Plan Note (Signed)
BP okay today on lasix and metoprolol alone. Checking CMP today and adjust as needed.

## 2016-04-20 NOTE — Assessment & Plan Note (Signed)
Resolved and unclear if truly pyelonephritis. Could be renal infarct some subtherapeutic INR.

## 2016-04-20 NOTE — Progress Notes (Signed)
   Subjective:    Patient ID: Robin DutyBarbara P Cromartie, female    DOB: [birth year], 76 y.o.   MRN: [id number]  HPI The patient is a 76 YO female coming in for hospital follow up (in for UTI with pyelonephritis versus renal infarct, low INR). She is doing well since being home. No more stomach or flank pain. No dysuria or frequency. She is weight stable since being home. Is now taking the keflex since Friday (lost rx from hospital and found out during tcm call). She denies chest pains, SOB. Some mild SOB with exertion which resolves with rest. No blood in stool or nosebleeds. Echo reviewed with them during the visit. No dizziness or lightheadedness since being home.   [facility], [facility], social history reviewed and updated.   Review of Systems  Constitutional: Positive for fatigue. Negative for activity change, appetite change and chills.  HENT: Negative.   Eyes: Negative.   Respiratory: Negative for cough, chest tightness and shortness of breath.   Cardiovascular: Positive for leg swelling. Negative for chest pain and palpitations.       Chronic  Gastrointestinal: Negative for abdominal distention, abdominal pain, constipation, diarrhea, nausea and vomiting.  Musculoskeletal: Negative.   Skin: Negative.   Neurological: Positive for weakness. Negative for dizziness, syncope, facial asymmetry, speech difficulty, light-headedness, numbness and headaches.  Psychiatric/Behavioral: Negative.       Objective:   Physical Exam  Constitutional: She is oriented to person, place, and time. She appears well-developed and well-nourished.  HENT:  Head: Normocephalic and atraumatic.  Eyes: EOM are normal.  Neck: Normal range of motion.  Cardiovascular: Normal rate.   Irreg irreg  Pulmonary/Chest: Effort normal and breath sounds normal. No respiratory distress. She has no wheezes. She has no rales.  Abdominal: Soft. Bowel sounds are normal. She exhibits no distension. There is no tenderness. There is no rebound.    Musculoskeletal: She exhibits edema.  1-2+ edema bilaterally which is stable.   Neurological: She is alert and oriented to person, place, and time. Coordination normal.  Skin: Skin is warm and dry.  Psychiatric: She has a normal mood and affect.   Vitals:   04/20/16 1407  BP: 110/64  Pulse: 71  Resp: 16  Temp: 98.6 F (37 C)  TempSrc: Oral  SpO2: 99%  Weight: 273 lb (123.8 kg)  Height: 5\' 7"  (1.702 m)      Assessment & Plan:

## 2016-04-20 NOTE — Assessment & Plan Note (Signed)
Checking INR today and will cancel her coumadin clinic visit tomorrow to condense visits and advise via phone with results and get next coumadin visit scheduled. Taking coumadin 2.5 mg MWF and 5 mg rest of the days.

## 2016-04-20 NOTE — Patient Instructions (Signed)
We are checking the labs today and will call you back with the results.   You do not need to come tomorrow to the coumadin clinic.   Come on Friday to the heart doctor.

## 2016-04-20 NOTE — Assessment & Plan Note (Signed)
Checking CMP today and adjust as needed. She is taken off her lisinopril/hctz in the hospital.

## 2016-04-20 NOTE — Assessment & Plan Note (Signed)
Taking keflex and no side effects. Given no symptoms with bacturia and left flank pain unclear if this was truly infection.

## 2016-04-20 NOTE — Progress Notes (Signed)
Pre visit review using our clinic review tool, if applicable. No additional management support is needed unless otherwise documented below in the visit note. 

## 2016-04-21 ENCOUNTER — Ambulatory Visit: Payer: Medicare HMO

## 2016-04-21 ENCOUNTER — Ambulatory Visit (INDEPENDENT_AMBULATORY_CARE_PROVIDER_SITE_OTHER): Payer: Medicare HMO | Admitting: General Practice

## 2016-04-21 NOTE — Progress Notes (Signed)
I have reviewed and agree with the plan. 

## 2016-04-21 NOTE — Patient Instructions (Signed)
Pre visit review using our clinic review tool, if applicable. No additional management support is needed unless otherwise documented below in the visit note. 

## 2016-04-24 ENCOUNTER — Ambulatory Visit (INDEPENDENT_AMBULATORY_CARE_PROVIDER_SITE_OTHER): Payer: Medicare HMO | Admitting: Physician Assistant

## 2016-04-24 ENCOUNTER — Ambulatory Visit: Payer: Medicare HMO

## 2016-04-24 ENCOUNTER — Encounter: Payer: Self-pay | Admitting: Physician Assistant

## 2016-04-24 VITALS — BP 160/82 | HR 64 | Ht 67.0 in | Wt 275.0 lb

## 2016-04-24 DIAGNOSIS — I10 Essential (primary) hypertension: Secondary | ICD-10-CM | POA: Diagnosis not present

## 2016-04-24 DIAGNOSIS — N189 Chronic kidney disease, unspecified: Secondary | ICD-10-CM

## 2016-04-24 DIAGNOSIS — R0602 Shortness of breath: Secondary | ICD-10-CM | POA: Diagnosis not present

## 2016-04-24 DIAGNOSIS — N280 Ischemia and infarction of kidney: Secondary | ICD-10-CM

## 2016-04-24 DIAGNOSIS — I872 Venous insufficiency (chronic) (peripheral): Secondary | ICD-10-CM

## 2016-04-24 DIAGNOSIS — I482 Chronic atrial fibrillation, unspecified: Secondary | ICD-10-CM

## 2016-04-24 DIAGNOSIS — D649 Anemia, unspecified: Secondary | ICD-10-CM

## 2016-04-24 DIAGNOSIS — N179 Acute kidney failure, unspecified: Secondary | ICD-10-CM

## 2016-04-24 NOTE — Progress Notes (Signed)
Cardiology Office Note    Date:  04/24/2016  ID:  Robin Snow, DOB 10/07/1940, MRN [id number] PCP:  Myrlene BrokerElizabeth A Crawford, MD  Cardiologist:  Dr. Clifton JamesMcAlhany   Chief Complaint: f/u hospitalization for renal infarct  History of Present Illness:  Robin DutyBarbara P Snow is a 76 y.o. female with history of chronic atrial fib, CVA, morbid obesity, HTN, DM2, chronic LEE/chronic venous insufficiency, dyslipidemia, anemia, recent possible renal infarct who presents for post-hospital follow-up. She was remotely on Coumadin but this was stopped due to GIB. She was admitted with CVA 2015 and was started on Eliquis. In 03/2016 she was switched from Eliquis back to Coumadin by primary care due to the fact that Eliquis was costing over $500/mo. In 04/2016 she was admitted with left flank pain felt due to pyelonephritis versus possible renal infarct. The situation was discussed with Dr. Clelia CroftShadad with hematology who did not feel this was a Coumadin failure as it may be attributed to subtherapeutic INR in December. She also had AKI prompting d/c of her lisnopril and HCTZ. 2D echo 04/16/16: EF 65-70%, no RWMA, mod MR, severe LAE, mod-severe RAE, PASP 52mmHg. Other pertinent studies include 1) remote cath 2001 without CAD, 2) carotid duplex 2015: 1-39% BICA, and 3) normal nuclear stress test in 2012.  She has since seen her PCP back on 04/20/16 at which time K 3.5, Cr 1.82 (recent values 1.4-1.6 in the hospital), albumin 3.1, Hgb 10.8 (variable history from 10-12 range). No med adjustments were made yet based on these. The patient returns to clinic today with her daughter Shanda BumpsJessica. She reports "good days and bad days." Some days she notes significant dyspnea on exertion, other days not as much. She also has significant lower extremity edema which she states is at baseline for her. She does not recall ever being given dietary restrictions and drinks water bottles "all day long." She states she is having to take Lasix every day. Her  blood pressure is elevated to 160/82 today but she states she forgot to take her medicines this AM which is unusual for her.   Past Medical History:  Diagnosis Date  . AKI (acute kidney injury) (HCC) 04/2016  . Anemia   . Arthritis   . Chronic atrial fibrillation (HCC)   . Chronic edema   . Chronic venous insufficiency   . Coagulopathy (HCC)   . Diabetes mellitus    Type 2  . Diverticulosis   . Dyslipidemia   . GERD (gastroesophageal reflux disease)   . GI bleed 2015   a. lower GI from diverticular source 2015.  [name]. Hx of transfusion of packed red blood cells   . Hypertension   . Morbid obesity (HCC)   . Pyelonephritis 04/2016  . Renal infarct (HCC)    a. 04/2016- adm with flank pain, ? pyelo vs renal infarct in setting of recent subtherapeutic INR.  [name]. Stroke [facility](HCC) 2015    Past Surgical History:  Procedure Laterality Date  . CESAREAN SECTION    . COLONOSCOPY Left 01/24/2013   Procedure: COLONOSCOPY;  Surgeon: Willis ModenaWilliam Outlaw, MD;  [facility];  Service: Endoscopy;  Laterality: Left;  . ESOPHAGOGASTRODUODENOSCOPY Left 01/23/2013   Procedure: ESOPHAGOGASTRODUODENOSCOPY (EGD);  Surgeon: Willis ModenaWilliam Outlaw, MD;  [facility];  Service: Endoscopy;  Laterality: Left;  . EYE SURGERY Bilateral    cataract surgery  . JOINT REPLACEMENT    . REPLACEMENT TOTAL KNEE BILATERAL    . TUBAL LIGATION      Current Medications: Current Outpatient  Prescriptions  Medication Sig Dispense Refill  . CARTIA XT 180 MG 24 hr capsule TAKE 1 CAPSULE EVERY DAY 90 capsule 3  . furosemide (LASIX) 80 MG tablet Take 1 tablet (80 mg total) by mouth daily as needed for fluid. 30 tablet 3  . gabapentin (NEURONTIN) 100 MG capsule TAKE 1 CAPSULE EVERY NIGHT AT BEDTIME 90 capsule 3  . gabapentin (NEURONTIN) 300 MG capsule TAKE 1 CAPSULE THREE TIMES DAILY 270 capsule 3  . lovastatin (MEVACOR) 20 MG tablet TAKE 1 TABLET EVERY NIGHT AT BEDTIME 90 tablet 2  . meclizine (ANTIVERT) 25 MG tablet Take  1 tablet (25 mg total) by mouth every 4 (four) hours as needed for dizziness. 60 tablet 0  . metoprolol (LOPRESSOR) 50 MG tablet Take 2 tablets (100 mg total) by mouth 2 (two) times daily. 120 tablet 0  . oxybutynin (DITROPAN) 5 MG tablet TAKE 1 TABLET EVERY DAY 90 tablet 3  . potassium chloride (KLOR-CON) 20 MEQ packet Take 20 mEq by mouth daily.    [name] warfarin (COUMADIN) 5 MG tablet Take 1 tablet (5 mg total) by mouth daily. (Patient taking differently: Take 2.5-5 mg by mouth daily. Take 2.5mg  on Monday, Wednesday and Friday. All other days take 5mg .) 90 tablet 3   No current facility-administered medications for this visit.      Allergies:   Aspirin; Hydrocodone; and Rofecoxib   Social History   Social History  . Marital status: Widowed    Spouse name: [name]  . Number of children: 4  . Years of education: 35   Social History Main Topics  . Smoking status: Former Smoker    Types: Cigarettes    Quit date: 07/07/1993  . Smokeless tobacco: Never Used     Comment: .3 cig a day  . Alcohol use No     Comment: no longer drinks alcohol  . Drug use: No  . Sexual activity: Not Asked   Other Topics Concern  . None   Social History Narrative   Patient is widowed and has 4 living children, and 2 deceased children.   Patient is right handed.   Patient has hs education.   Patient drinks coffee and soda once a week.     Family History:  The patient's family history includes Cancer in her father; Stroke in her maternal aunt.   ROS:   Please see the history of present illness. All other systems are reviewed and otherwise negative.    PHYSICAL EXAM:   VS:  BP (!) 160/82 (BP Location: Right Arm, Patient Position: Sitting, Cuff Size: Large)   Pulse 64   Ht 5\' 7"  (1.702 m)   Wt 275 lb (124.7 kg)   BMI 43.07 kg/m   BMI: Body mass index is 43.07 kg/m. GEN: Well nourished, well developed morbidly obese AAF, in no acute distress  HEENT: normocephalic, atraumatic Neck: carotid bruits or  masses, prominent external jugular vein Cardiac: irregularly irregular; rate controlled, no murmurs, rubs, or gallops, 2+ stiff chronic appearing lower extremity edema  Respiratory:  clear to auscultation bilaterally, normal work of breathing GI: soft, nontender, nondistended, + BS MS: no deformity or atrophy  Skin: warm and dry, no rash Neuro:  Alert and Oriented x 3, Strength and sensation are intact, follows commands Psych: euthymic mood, full affect  Wt Readings from Last 3 Encounters:  04/24/16 275 lb (124.7 kg)  04/20/16 273 lb (123.8 kg)  04/15/16 287 lb 7.7 oz (130.4 kg)      Studies/Labs Reviewed:  EKG:  EKG was not ordered today.  Recent Labs: 04/20/2016: ALT 15; BUN 18; Creatinine, Ser 1.82; Hemoglobin 10.8; Platelets 206.0; Potassium 4.5; Sodium 139   Lipid Panel    Component Value Date/Time   CHOL 129 04/26/2015 1649   TRIG 129.0 04/26/2015 1649   HDL 33.70 (L) 04/26/2015 1649   CHOLHDL 4 04/26/2015 1649   VLDL 25.8 04/26/2015 1649   LDLCALC 69 04/26/2015 1649    Additional studies/ records that were reviewed today include: Summarized above.    ASSESSMENT & PLAN:   1. Chronic atrial fib with possible recent renal infarct - occurred in the setting of subtherapeutic INR. She previously had an embolic event off anticoagulation in 2015 as well. In the future if she ever has to come off anticoagulation for any reason, she will require bridging. The patient cannot afford Eliquis. If she continues to have issues with INR lability, we may need to have patient assistance manager investigate her insurance to determine if they provide coverage on any other agents such as Xarelto or Pradaxa. In the meantime, while she is experiencing renal insufficiency, I think continuing Coumadin is appropriate. 2. Acute kidney injury - she has since sustained acute kidney injury in the setting of her renal infarct, which is not unusual. It is unknown where new baseline renal function  will fall. Recheck today to help guide further rx. If Cr remains >1.6 I would recommend referral to nephrology. 3. Chronic venous insufficiency/dyspnea on exertion/pulmonary HTN - I suspect she has chronic diastolic CHF. We reviewed importance of low sodium 2000mg  diet and 2L fluid restriction. Recheck labs today to help guide therapy. She is currently taking Lasix 80mg  daily rather than PRN. Even at that she appears volume overloaded but notes and patient report would seem to indicate the chronic edema is at baseline. 4. Pulmonary hypertension - I suspect this to be in the setting of morbid obesity and diastolic CHF. She seemed somewhat overwhelmed today. I will plan to broach the issue of sleep study with her in follow-up. 5. Essential HTN - she has not yet taken her medications today. She reports in general she is very compliant. Per literature review, in setting of renal infarct, many patients with acute renal infarction develop an elevation in blood pressure during the first week which may subside over time, unless the patient has underlying hypertension. Her BP at PCP several days ago was 110/64. I have asked her to continue her current regimen and to make sure she takes her medications the morning of her follow-up appointment, at least 3 hours beforehand. 6. Anemia - f/u Hgb to trend.  Disposition: F/u with me in 10 days.   Medication Adjustments/Labs and Tests Ordered: Current medicines are reviewed at length with the patient today.  Concerns regarding medicines are outlined above. Medication changes, Labs and Tests ordered today are summarized above and listed in the Patient Instructions accessible in Encounters.   Thomasene Mohair PA-C  04/24/2016 3:27 PM    [facility] [address] [place], [place], [place]  [address] Phone: [phone]; Fax: [phone]

## 2016-04-24 NOTE — Patient Instructions (Addendum)
Medication Instructions:   Your physician recommends that you continue on your current medications as directed. Please refer to the Current Medication list given to you today.    If you need a refill on your cardiac medications before your next appointment, please call your pharmacy.  Labwork:  BMET  CBC AND BNP TODAY    Testing/Procedures: NONE ORDERED  TODAY    Follow-Up:  10 DAYS WITH  DAYNA DUNN    Any Other Special Instructions Will Be Listed Below (If Applicable). For patients with fluid retention, we give them these special instructions:  1. Follow a low-salt diet and watch your fluid intake. In general, you should not be taking in more than 2 liters of fluid per day (no more than 8 glasses per day). Some patients are restricted to less than 1.5 liters of fluid per day (no more than 6 glasses per day). This includes sources of water in foods like soup, coffee, tea, milk, etc. You are allowed 2,000mg  of sodium per day maximum. 2. Weigh yourself on the same scale at same time of day and keep a log. 3. Call your doctor: (Anytime you feel any of the following symptoms)  - 3-4 pound weight gain in 1-2 days or 2 pounds overnight  - Shortness of breath, with or without a dry hacking cough  - Swelling in the hands, feet or stomach  - If you have to sleep on extra pillows at night in order to breathe   IT IS IMPORTANT TO LET YOUR DOCTOR KNOW EARLY ON IF YOU ARE HAVING SYMPTOMS SO WE CAN HELP YOU!

## 2016-04-25 LAB — CBC
Hematocrit: 31.5 % — ABNORMAL LOW (ref 34.0–46.6)
Hemoglobin: 10.3 g/dL — ABNORMAL LOW (ref 11.1–15.9)
MCH: 28.9 pg (ref 26.6–33.0)
MCHC: 32.7 g/dL (ref 31.5–35.7)
MCV: 88 fL (ref 79–97)
PLATELETS: 255 10*3/uL (ref 150–379)
RBC: 3.57 x10E6/uL — ABNORMAL LOW (ref 3.77–5.28)
RDW: 15.1 % (ref 12.3–15.4)
WBC: 6 10*3/uL (ref 3.4–10.8)

## 2016-04-25 LAB — PRO B NATRIURETIC PEPTIDE: NT-PRO BNP: 669 pg/mL (ref 0–738)

## 2016-04-25 LAB — BASIC METABOLIC PANEL
BUN / CREAT RATIO: 10 — AB (ref 12–28)
BUN: 16 mg/dL (ref 8–27)
CALCIUM: 8.6 mg/dL — AB (ref 8.7–10.3)
CHLORIDE: 103 mmol/L (ref 96–106)
CO2: 24 mmol/L (ref 18–29)
Creatinine, Ser: 1.57 mg/dL — ABNORMAL HIGH (ref 0.57–1.00)
GFR calc Af Amer: 37 mL/min/{1.73_m2} — ABNORMAL LOW (ref >59)
GFR calc non Af Amer: 32 mL/min/{1.73_m2} — ABNORMAL LOW (ref >59)
Glucose: 94 mg/dL (ref 65–99)
POTASSIUM: 4.8 mmol/L (ref 3.5–5.2)
SODIUM: 141 mmol/L (ref 134–144)

## 2016-05-01 ENCOUNTER — Ambulatory Visit (INDEPENDENT_AMBULATORY_CARE_PROVIDER_SITE_OTHER): Payer: Medicare HMO | Admitting: General Practice

## 2016-05-01 ENCOUNTER — Other Ambulatory Visit (INDEPENDENT_AMBULATORY_CARE_PROVIDER_SITE_OTHER): Payer: Medicare HMO

## 2016-05-01 ENCOUNTER — Encounter: Payer: Self-pay | Admitting: Internal Medicine

## 2016-05-01 ENCOUNTER — Ambulatory Visit (INDEPENDENT_AMBULATORY_CARE_PROVIDER_SITE_OTHER): Payer: Medicare HMO | Admitting: Internal Medicine

## 2016-05-01 VITALS — BP 130/80 | HR 76 | Temp 98.4°F | Resp 12 | Ht 67.0 in | Wt 272.0 lb

## 2016-05-01 DIAGNOSIS — D649 Anemia, unspecified: Secondary | ICD-10-CM

## 2016-05-01 DIAGNOSIS — Z5181 Encounter for therapeutic drug level monitoring: Secondary | ICD-10-CM | POA: Diagnosis not present

## 2016-05-01 DIAGNOSIS — I482 Chronic atrial fibrillation, unspecified: Secondary | ICD-10-CM

## 2016-05-01 LAB — CBC
HEMATOCRIT: 35.7 % — AB (ref 36.0–46.0)
HEMOGLOBIN: 12.1 g/dL (ref 12.0–15.0)
MCHC: 33.9 g/dL (ref 30.0–36.0)
MCV: 86.8 fl (ref 78.0–100.0)
PLATELETS: 277 10*3/uL (ref 150.0–400.0)
RBC: 4.11 Mil/uL (ref 3.87–5.11)
RDW: 15 % (ref 11.5–15.5)
WBC: 5.3 10*3/uL (ref 4.0–10.5)

## 2016-05-01 LAB — FERRITIN: FERRITIN: 139.7 ng/mL (ref 10.0–291.0)

## 2016-05-01 LAB — POCT INR: INR: 2.4

## 2016-05-01 MED ORDER — IRON HIGH-POTENCY 325 MG PO TABS
1.0000 | ORAL_TABLET | Freq: Two times a day (BID) | ORAL | 6 refills | Status: DC
Start: 2016-05-01 — End: 2016-05-22

## 2016-05-01 NOTE — Progress Notes (Signed)
Pre visit review using our clinic review tool, if applicable. No additional management support is needed unless otherwise documented below in the visit note. 

## 2016-05-01 NOTE — Patient Instructions (Addendum)
We are checking the labs today.  We have sent in the iron pills to start taking twice a day.

## 2016-05-01 NOTE — Patient Instructions (Signed)
Pre visit review using our clinic review tool, if applicable. No additional management support is needed unless otherwise documented below in the visit note. 

## 2016-05-01 NOTE — Progress Notes (Signed)
   Subjective:    Patient ID: Robin Snow, female    DOB: October 11, 1940, 76 y.o.   MRN: [id number]  HPI The patient is a 76 YO female coming in for low hemoglobin on cardiology labs. Her Hg was 12 several weeks ago and is now 10.3 last count. She denies bleeding or dark stools. She did have colonoscopy in 2015 and with diverticulosis. She denies use of NSAIDs or goody powders at all. Takes tylenol for pain as needed. She is also fluid overloaded and they have changed her diet and fluid pills. Some SOB with exertion and some lightheadedness with standing.   Review of Systems  Constitutional: Negative.   Respiratory: Negative.   Cardiovascular: Negative.   Gastrointestinal: Negative for abdominal distention, abdominal pain, blood in stool, constipation, diarrhea, nausea and vomiting.       Some loose stools  Musculoskeletal: Negative.   Neurological: Positive for dizziness and light-headedness. Negative for tremors, syncope and weakness.      Objective:   Physical Exam  Constitutional: She is oriented to person, place, and time. She appears well-developed and well-nourished.  HENT:  Head: Normocephalic and atraumatic.  Eyes: EOM are normal.  Neck: Normal range of motion.  Cardiovascular: Normal rate and regular rhythm.   Pulmonary/Chest: Effort normal. No respiratory distress. She has no wheezes. She has no rales.  Abdominal: Soft. She exhibits no distension. There is no tenderness.  Neurological: She is alert and oriented to person, place, and time.  Skin: Skin is warm and dry.   Vitals:   05/01/16 1037  BP: 130/80  Pulse: 76  Resp: 12  Temp: 98.4 F (36.9 C)  TempSrc: Oral  SpO2: 99%  Weight: 272 lb (123.4 kg)  Height: 5\' 7"  (1.702 m)      Assessment & Plan:

## 2016-05-01 NOTE — Assessment & Plan Note (Signed)
Checking CBC and ferritin. Start iron BID for her. Likely source is diverticulosis. If Hg still dropping will refer back to her GI provider. No obvious other source except possibly some dilution with her fluid overload.

## 2016-05-07 ENCOUNTER — Ambulatory Visit: Payer: Medicare HMO | Admitting: Physician Assistant

## 2016-05-08 ENCOUNTER — Ambulatory Visit: Payer: Medicare HMO | Admitting: Physician Assistant

## 2016-05-11 ENCOUNTER — Encounter: Payer: Self-pay | Admitting: Physician Assistant

## 2016-05-11 ENCOUNTER — Encounter: Payer: Self-pay | Admitting: *Deleted

## 2016-05-11 ENCOUNTER — Ambulatory Visit (INDEPENDENT_AMBULATORY_CARE_PROVIDER_SITE_OTHER): Payer: Medicare HMO | Admitting: Physician Assistant

## 2016-05-11 VITALS — BP 134/68 | HR 55 | Ht 67.0 in | Wt 270.0 lb

## 2016-05-11 DIAGNOSIS — N183 Chronic kidney disease, stage 3 unspecified: Secondary | ICD-10-CM | POA: Insufficient documentation

## 2016-05-11 DIAGNOSIS — I272 Pulmonary hypertension, unspecified: Secondary | ICD-10-CM

## 2016-05-11 DIAGNOSIS — I4821 Permanent atrial fibrillation: Secondary | ICD-10-CM

## 2016-05-11 DIAGNOSIS — I482 Chronic atrial fibrillation: Secondary | ICD-10-CM

## 2016-05-11 DIAGNOSIS — I10 Essential (primary) hypertension: Secondary | ICD-10-CM

## 2016-05-11 DIAGNOSIS — R0683 Snoring: Secondary | ICD-10-CM

## 2016-05-11 DIAGNOSIS — R600 Localized edema: Secondary | ICD-10-CM | POA: Diagnosis not present

## 2016-05-11 MED ORDER — DILTIAZEM HCL ER COATED BEADS 120 MG PO CP24: 120.0000 mg | ORAL_CAPSULE | Freq: Every day | ORAL | 3 refills | Status: DC

## 2016-05-11 NOTE — Patient Instructions (Addendum)
Medication Instructions:  1. DECREASE CARTIA XT TO 120 MG ONCE A DAY; NEW RX HAS BEEN SENT IN   Labwork: NONE  Testing/Procedures: 1. Your physician has recommended that you have a SPLIT NIGHT sleep study. This test records several body functions during sleep, including: brain activity, eye movement, oxygen and carbon dioxide blood levels, heart rate and rhythm, breathing rate and rhythm, the flow of air through your mouth and nose, snoring, body muscle movements, and chest and belly movement.   Follow-Up: KEEP YOUR UP COMING APPT WITH DR. Clifton JamesMCALHANY 06/2016  Any Other Special Instructions Will Be Listed Below (If Applicable).  If you need a refill on your cardiac medications before your next appointment, please call your pharmacy.

## 2016-05-11 NOTE — Progress Notes (Signed)
Cardiology Office Note:    Date:  05/11/2016   ID:  Robin, Snow 03-Jun-1940, MRN [id number]  PCP:  [name], MD  Cardiologist:  Dr. Verne Carrow   Electrophysiologist:  n/a  Referring MD: [name], *   Chief Complaint  Patient presents with  . Follow-up    AFib    History of Present Illness:    Robin Snow is a 76 y.o. female with a hx of permanent AF, prior stroke, morbid obeisty, HTN, DM2, chronic LE edema 2/2 venous insufficiency, HL, anemia.  She was taken off of Coumadin in the past due to GI bleeding.  She was ultimately placed on Eliquis.  She was changed from Eliquis back to Coumadin in 03/2016 2/2 cost.  Last seen by Dr. Verne Carrow in 9/16.   Admitted in 1/18 with left flank pain and a UTI. CT scan was notable for left renal infarct versus pyelonephritis. INR had been recently subtherapeutic.  INR was greater than 2 upon admission. Hematology felt that possible renal infarct was more than likely related to recent subtherapeutic INR. Patient was seen in follow up with Robin Spies, PA-C on 04/24/16.  She had a NT-Pro BNP that was normal.  Hgb was low and a follow up lab with her PCP demonstrated a normal hemoglobin.  She returns today for close follow up.    She is here with her daughter, Robin Snow.  The patient denies chest pain or significant changes in her dyspnea on exertion.  She is NYHA 2b.  She denies orthopnea, PND.  Her LE edema is unchanged.  She denies syncope or near syncope.  She denies any melena or hematochezia.    Prior CV studies that were reviewed today include:    Echo 04/16/16 EF 65-70, normal wall motion, moderate MR, severe LAE, moderate to severe RAE, PASP 52  Carotid US 11/30/13 Bilateral ICA 1-39  Echo 8/15 Mild LVH, EF 60-65, normal wall motion, severe BAE, PASP 33  Myoview 1/12 1.  No evidence for pharmacologically induced myocardial ischemia. 2.  Left ventricular ejection fraction equals  65%. 3.  Normal left ventricular systolic function.  LHC 08/1999 EF 70-75 Normal coronary arteries  Past Medical History:  Diagnosis Date  . AKI (acute kidney injury) (HCC) 04/2016  . Anemia   . Arthritis   . Chronic atrial fibrillation (HCC)   . Chronic edema   . Chronic venous insufficiency   . Coagulopathy (HCC)   . Diabetes mellitus    Type 2  . Diverticulosis   . Dyslipidemia   . GERD (gastroesophageal reflux disease)   . GI bleed 2015   a. lower GI from diverticular source 2015.  Robin Snow Hx of transfusion of packed red blood cells   . Hypertension   . Morbid obesity (HCC)   . Pyelonephritis 04/2016  . Renal infarct (HCC)    a. 04/2016- adm with flank pain, ? pyelo vs renal infarct in setting of recent subtherapeutic INR.  Robin Snow Stroke [facility]) 2015    Past Surgical History:  Procedure Laterality Date  . CESAREAN SECTION    . COLONOSCOPY Left 01/24/2013   Procedure: COLONOSCOPY;  Surgeon: Willis Modena, MD;  Location: [facility] ENDOSCOPY;  Service: Endoscopy;  Laterality: Left;  . ESOPHAGOGASTRODUODENOSCOPY Left 01/23/2013   Procedure: ESOPHAGOGASTRODUODENOSCOPY (EGD);  Surgeon: Willis Modena, MD;  Location: [facility] ENDOSCOPY;  Service: Endoscopy;  Laterality: Left;  . EYE SURGERY Bilateral    cataract surgery  . JOINT REPLACEMENT    .  REPLACEMENT TOTAL KNEE BILATERAL    . TUBAL LIGATION      Current Medications: Current Meds  Medication Sig  . Ferrous Sulfate (IRON HIGH-POTENCY) 325 MG TABS Take 1 tablet by mouth 2 (two) times daily.  . furosemide (LASIX) 80 MG tablet Take 1 tablet (80 mg total) by mouth daily as needed for fluid.  Robin Snow gabapentin (NEURONTIN) 100 MG capsule TAKE 1 CAPSULE EVERY NIGHT AT BEDTIME  . gabapentin (NEURONTIN) 300 MG capsule TAKE 1 CAPSULE THREE TIMES DAILY  . lovastatin (MEVACOR) 20 MG tablet TAKE 1 TABLET EVERY NIGHT AT BEDTIME  . meclizine (ANTIVERT) 25 MG tablet Take 1 tablet (25 mg total) by mouth every 4 (four) hours as needed for dizziness.  .  metoprolol (LOPRESSOR) 50 MG tablet Take 2 tablets (100 mg total) by mouth 2 (two) times daily.  Robin Snow oxybutynin (DITROPAN) 5 MG tablet TAKE 1 TABLET EVERY DAY  . potassium chloride (KLOR-CON) 20 MEQ packet Take 20 mEq by mouth daily.  Robin Snow warfarin (COUMADIN) 5 MG tablet Take 1 tablet (5 mg total) by mouth daily. (Patient taking differently: Take 2.5-5 mg by mouth daily. Take 2.5mg  on Monday, Wednesday and Friday. All other days take 5mg .)  . [DISCONTINUED] CARTIA XT 180 MG 24 hr capsule TAKE 1 CAPSULE EVERY DAY     Allergies:   Aspirin; Hydrocodone; and Rofecoxib   Social History   Social History  . Marital status: Widowed    Spouse name: [name]  . Number of children: 4  . Years of education: 45   Social History Main Topics  . Smoking status: Former Smoker    Types: Cigarettes    Quit date: 07/07/1993  . Smokeless tobacco: Never Used     Comment: .3 cig a day  . Alcohol use No     Comment: no longer drinks alcohol  . Drug use: No  . Sexual activity: Not on file   Other Topics Concern  . Not on file   Social History Narrative   Patient is widowed and has 4 living children, and 2 deceased children.   Patient is right handed.   Patient has hs education.   Patient drinks coffee and soda once a week.     Family History:  The patient's family history includes Cancer in her father; Stroke in her maternal aunt.   ROS:   Please see the history of present illness.    Review of Systems  Constitution: Positive for decreased appetite.  Cardiovascular: Positive for dyspnea on exertion, irregular heartbeat and leg swelling.  Hematologic/Lymphatic: Bruises/bleeds easily.  Neurological: Positive for dizziness.   All other systems reviewed and are negative.   EKGs/Labs/Other Test Reviewed:    EKG:  EKG is  ordered today.  The ekg ordered today demonstrates sinus brady, HR 55, 1st degree AVB, QTc 413 ms  Recent Labs: 04/20/2016: ALT 15 04/24/2016: BUN 16; Creatinine, Ser 1.57; NT-Pro BNP  669; Potassium 4.8; Sodium 141 05/01/2016: Hemoglobin 12.1; Platelets 277.0   Recent Lipid Panel    Component Value Date/Time   CHOL 129 04/26/2015 1649   TRIG 129.0 04/26/2015 1649   HDL 33.70 (L) 04/26/2015 1649   CHOLHDL 4 04/26/2015 1649   VLDL 25.8 04/26/2015 1649   LDLCALC 69 04/26/2015 1649     Physical Exam:    VS:  BP 134/68   Pulse (!) 55   Ht 5\' 7"  (1.702 m)   Wt 270 lb (122.5 kg)   BMI 42.29 kg/m     Wt  Readings from Last 3 Encounters:  05/11/16 270 lb (122.5 kg)  05/01/16 272 lb (123.4 kg)  04/24/16 275 lb (124.7 kg)     Physical Exam  Constitutional: She is oriented to person, place, and time. She appears well-developed and well-nourished. No distress.  HENT:  Head: Normocephalic and atraumatic.  Eyes: No scleral icterus.  Neck: JVD present.  JVP 6-7 cm at 90 degrees  Cardiovascular: Normal rate and regular rhythm.   No murmur heard. Pulmonary/Chest: Effort normal. She has no wheezes. She has no rales.  Abdominal: Soft. There is no tenderness.  Musculoskeletal: She exhibits edema.  1-2+ bilat LE edema  Neurological: She is alert and oriented to person, place, and time.  Skin: Skin is warm and dry.  Psychiatric: She has a normal mood and affect.    ASSESSMENT:    1. Permanent atrial fibrillation (HCC)   2. CKD (chronic kidney disease) stage 3, GFR 30-59 ml/min   3. Leg edema   4. Pulmonary HTN   5. Essential hypertension   6. Snoring    PLAN:    In order of problems listed above:  1. Permanent AF - She is in sinus brady today.  She has a long 1st degree AVB in NSR.  She remains on Coumadin as the cost of Eliquis was prohibitive.  She had a recent admit with probable renal infarct.  She will need Lovenox bridge in the future if Coumadin therapy must be interrupted.  Her CrCl 59.4.  Xarelto 20 mg QD could be considered in the future if Coumadin has to be stopped.  -  Decrease Diltiazem to 120 mg QD   -  Continue Coumadin - this is managed by  PCP  -  Continue Metoprolol at current dose   2. CKD - Renal function remains stable.    3. LE edema - Likely multifactorial and related to venous insufficiency, diastolic CHF, RV dysfunction, immobility.  Recent BNP normal. Continue current dose of Lasix.  4. Pulmonary HTN - May be related to age and obesity.  But she does snore.  Will arrange split night sleep study.  5. HTN - BP controlled.    Medication Adjustments/Labs and Tests Ordered: Current medicines are reviewed at length with the patient today.  Concerns regarding medicines are outlined above.  Medication changes, Labs and Tests ordered today are outlined in the Patient Instructions noted below. Patient Instructions  Medication Instructions:  1. DECREASE CARTIA XT TO 120 MG ONCE A DAY; NEW RX HAS BEEN SENT IN   Labwork: NONE  Testing/Procedures: 1. Your physician has recommended that you have a SPLIT NIGHT sleep study. This test records several body functions during sleep, including: brain activity, eye movement, oxygen and carbon dioxide blood levels, heart rate and rhythm, breathing rate and rhythm, the flow of air through your mouth and nose, snoring, body muscle movements, and chest and belly movement.   Follow-Up: KEEP YOUR UP COMING APPT WITH DR. Clifton James 06/2016  Any Other Special Instructions Will Be Listed Below (If Applicable).  If you need a refill on your cardiac medications before your next appointment, please call your pharmacy.  Signed, [name], PA-C  05/11/2016 4:23 PM    [facility] [address] [place], [place], [place]  [address] Phone: [phone]; Fax: [phone]

## 2016-05-22 ENCOUNTER — Other Ambulatory Visit: Payer: Self-pay | Admitting: Internal Medicine

## 2016-05-22 ENCOUNTER — Ambulatory Visit (INDEPENDENT_AMBULATORY_CARE_PROVIDER_SITE_OTHER): Payer: Medicare HMO | Admitting: General Practice

## 2016-05-22 DIAGNOSIS — Z5181 Encounter for therapeutic drug level monitoring: Secondary | ICD-10-CM

## 2016-05-22 DIAGNOSIS — I4821 Permanent atrial fibrillation: Secondary | ICD-10-CM

## 2016-05-22 LAB — POCT INR: INR: 4.8

## 2016-05-22 NOTE — Patient Instructions (Signed)
Pre visit review using our clinic review tool, if applicable. No additional management support is needed unless otherwise documented below in the visit note. 

## 2016-05-22 NOTE — Progress Notes (Signed)
I have reviewed and agree with the plan. 

## 2016-06-11 ENCOUNTER — Ambulatory Visit: Payer: Commercial Managed Care - HMO | Admitting: Cardiovascular Disease

## 2016-06-12 ENCOUNTER — Ambulatory Visit (INDEPENDENT_AMBULATORY_CARE_PROVIDER_SITE_OTHER): Payer: Medicare HMO | Admitting: General Practice

## 2016-06-12 DIAGNOSIS — Z5181 Encounter for therapeutic drug level monitoring: Secondary | ICD-10-CM | POA: Diagnosis not present

## 2016-06-12 DIAGNOSIS — I4821 Permanent atrial fibrillation: Secondary | ICD-10-CM

## 2016-06-12 DIAGNOSIS — I482 Chronic atrial fibrillation: Secondary | ICD-10-CM

## 2016-06-12 LAB — POCT INR: INR: 3.7

## 2016-06-12 NOTE — Patient Instructions (Signed)
Pre visit review using our clinic review tool, if applicable. No additional management support is needed unless otherwise documented below in the visit note. 

## 2016-06-12 NOTE — Progress Notes (Signed)
I have reviewed and agree with the plan. 

## 2016-06-17 ENCOUNTER — Encounter: Payer: Self-pay | Admitting: Cardiovascular Disease

## 2016-07-08 ENCOUNTER — Encounter: Payer: Self-pay | Admitting: Physician Assistant

## 2016-07-08 DIAGNOSIS — R001 Bradycardia, unspecified: Secondary | ICD-10-CM | POA: Insufficient documentation

## 2016-07-08 DIAGNOSIS — I482 Chronic atrial fibrillation, unspecified: Secondary | ICD-10-CM | POA: Insufficient documentation

## 2016-07-08 NOTE — Progress Notes (Deleted)
Cardiology Office Note    Date:  07/08/2016  ID:  Robin, Snow [birth year], MRN [id number] PCP:  [name], MD  Cardiologist:  Clifton James   Chief Complaint: f/u atrial fib  History of Present Illness:  Robin Snow is a 76 y.o. female with history of chronic atrial fib, CVA, morbid obesity, HTN, DM2, chronic LEE/chronic venous insufficiency, chronic diastolic CHF, dyslipidemia, anemia, renal infarct with possible resultant CKD who presents for post-hospital follow-up. She was remotely on Coumadin but this was stopped due to GIB. She was admitted with CVA 2015 and was started on Eliquis. In 03/2016 she was switched from Eliquis back to Coumadin by primary care due to the fact that Eliquis was costing over $500/mo. In 04/2016 she was admitted with left flank pain felt due to pyelonephritis versus possible renal infarct. The situation was discussed with Dr. Clelia Croft with hematology who did not feel this was a Coumadin failure as it may be attributed to subtherapeutic INR in December. She also had AKI prompting d/c of her lisnopril and HCTZ. 2D echo 04/16/16: EF 65-70%, no RWMA, mod MR, severe LAE, mod-severe RAE, PASP . Other pertinent studies include 1) remote cath 2001 without CAD, 2) carotid duplex 2015: 1-39% BICA, and 3) normal nuclear stress test in 2012.  I saw her in follow-up in 04/2016 at which time her significant LEE was back to baseline. PBNP was normal. She was drinking excess water so we talked about that. Hgb was low and a follow up lab with her PCP demonstrated a normal hemoglobin. Last Cr was 1.57 in 04/2016. Echo 04/16/16: EF 65-70, normal wall motion, moderate MR, severe LAE, moderate to severe RAE, PASP 52. When she saw Tereso Snow 05/11/16 her HR was low and in sinus bradycardia prompting a reduction in Robin Snow.    Chronic atrial fibrillation with recent sinus bradycardia Suspect CKD III Chronic venous insufficiency/chronic diastolic CHF Pulmonary  HTN Essential HTN    Past Medical History:  Diagnosis Date  . AKI (acute kidney injury) (HCC) 04/2016  . Anemia   . Arthritis   . Chronic atrial fibrillation (HCC)   . Chronic edema   . Chronic venous insufficiency   . Coagulopathy (HCC)   . Diabetes mellitus    Type 2  . Diverticulosis   . Dyslipidemia   . GERD (gastroesophageal reflux disease)   . GI bleed 2015   a. lower GI from diverticular source 2015.  [name] Hx of transfusion of packed red blood cells   . Hypertension   . Morbid obesity (HCC)   . Pyelonephritis 04/2016  . Renal infarct (HCC)    a. 04/2016- adm with flank pain, ? pyelo vs renal infarct in setting of recent subtherapeutic INR.  [name] Stroke [facility]) 2015    Past Surgical History:  Procedure Laterality Date  . CESAREAN SECTION    . COLONOSCOPY Left 01/24/2013   Procedure: COLONOSCOPY;  Surgeon: Willis Modena, MD;  Location: [facility] ENDOSCOPY;  Service: Endoscopy;  Laterality: Left;  . ESOPHAGOGASTRODUODENOSCOPY Left 01/23/2013   Procedure: ESOPHAGOGASTRODUODENOSCOPY (EGD);  Surgeon: Willis Modena, MD;  Location: [facility] ENDOSCOPY;  Service: Endoscopy;  Laterality: Left;  . EYE SURGERY Bilateral    cataract surgery  . JOINT REPLACEMENT    . REPLACEMENT TOTAL KNEE BILATERAL    . TUBAL LIGATION      Current Medications: Current Outpatient Prescriptions  Medication Sig Dispense Refill  . diltiazem (CARDIZEM CD) 120 MG 24 hr capsule Take 1 capsule (120 mg total) by  mouth daily. 90 capsule 3  . ferrous sulfate 325 (65 FE) MG EC tablet TAKE ONE TABLET BY MOUTH TWICE DAILY 60 tablet 6  . furosemide (LASIX) 80 MG tablet Take 1 tablet (80 mg total) by mouth daily as needed for fluid. 30 tablet 3  . gabapentin (NEURONTIN) 100 MG capsule TAKE 1 CAPSULE EVERY NIGHT AT BEDTIME 90 capsule 3  . gabapentin (NEURONTIN) 300 MG capsule TAKE 1 CAPSULE THREE TIMES DAILY 270 capsule 3  . lovastatin (MEVACOR) 20 MG tablet TAKE 1 TABLET EVERY NIGHT AT BEDTIME 90 tablet 2  . meclizine  (ANTIVERT) 25 MG tablet Take 1 tablet (25 mg total) by mouth every 4 (four) hours as needed for dizziness. 60 tablet 0  . metoprolol (LOPRESSOR) 50 MG tablet Take 2 tablets (100 mg total) by mouth 2 (two) times daily. 120 tablet 0  . oxybutynin (DITROPAN) 5 MG tablet TAKE 1 TABLET EVERY DAY 90 tablet 3  . potassium chloride (KLOR-CON) 20 MEQ packet Take 20 mEq by mouth daily.    [name] warfarin (COUMADIN) 5 MG tablet Take 1 tablet (5 mg total) by mouth daily. (Patient taking differently: Take 2.5-5 mg by mouth daily. Take 2.5mg  on Monday, Wednesday and Friday. All other days take .) 90 tablet 3   No current facility-administered medications for this visit.      Allergies:   Aspirin; Hydrocodone; and Rofecoxib   Social History   Social History  . Marital status: Widowed    Spouse name: [name]  . Number of children: 4  . Years of education: 13   Social History Main Topics  . Smoking status: Former Smoker    Types: Cigarettes    Quit date: 07/07/1993  . Smokeless tobacco: Never Used     Comment: .3 cig a day  . Alcohol use No     Comment: no longer drinks alcohol  . Drug use: No  . Sexual activity: Not on file   Other Topics Concern  . Not on file   Social History Narrative   Patient is widowed and has 4 living children, and 2 deceased children.   Patient is right handed.   Patient has hs education.   Patient drinks coffee and soda once a week.     Family History:  Family History  Problem Relation Age of Onset  . Cancer Father   . Stroke Maternal Aunt    ***  ROS:   Please see the history of present illness. Otherwise, review of systems is positive for ***.  All other systems are reviewed and otherwise negative.    PHYSICAL EXAM:   VS:  There were no vitals taken for this visit.  BMI: There is no height or weight on file to calculate BMI. GEN: Well nourished, well developed, in no acute distress  HEENT: normocephalic, atraumatic Neck: no JVD, carotid bruits, or  masses Cardiac: ***RRR; no murmurs, rubs, or gallops, no edema  Respiratory:  clear to auscultation bilaterally, normal work of breathing GI: soft, nontender, nondistended, + BS MS: no deformity or atrophy  Skin: warm and dry, no rash Neuro:  Alert and Oriented x 3, Strength and sensation are intact, follows commands Psych: euthymic mood, full affect  Wt Readings from Last 3 Encounters:  05/11/16 270 lb (122.5 kg)  05/01/16 272 lb (123.4 kg)  04/24/16 275 lb (124.7 kg)      Studies/Labs Reviewed:   EKG:  EKG was ordered today and personally reviewed by me and demonstrates *** EKG was  not ordered today.***  Recent Labs: 04/20/2016: ALT 15 04/24/2016: BUN 16; Creatinine, Ser 1.57; NT-Pro BNP 669; Potassium 4.8; Sodium 141 05/01/2016: Hemoglobin 12.1; Platelets 277.0   Lipid Panel    Component Value Date/Time   CHOL 129 04/26/2015 1649   TRIG 129.0 04/26/2015 1649   HDL 33.70 (L) 04/26/2015 1649   CHOLHDL 4 04/26/2015 1649   VLDL 25.8 04/26/2015 1649   LDLCALC 69 04/26/2015 1649    Additional studies/ records that were reviewed today include: Summarized above.***    ASSESSMENT & PLAN:   1. ***  Disposition: F/u with ***   Medication Adjustments/Labs and Tests Ordered: Current medicines are reviewed at length with the patient today.  Concerns regarding medicines are outlined above. Medication changes, Labs and Tests ordered today are summarized above and listed in the Patient Instructions accessible in Encounters.   Thomasene Mohair PA-C  07/08/2016 5:14 PM    [facility] [address] [place], [place], [place]  [address] Phone: [phone]; Fax: [phone]

## 2016-07-09 ENCOUNTER — Ambulatory Visit: Payer: Medicare HMO | Admitting: Physician Assistant

## 2016-07-10 ENCOUNTER — Ambulatory Visit (INDEPENDENT_AMBULATORY_CARE_PROVIDER_SITE_OTHER): Payer: Medicare HMO | Admitting: General Practice

## 2016-07-10 DIAGNOSIS — I482 Chronic atrial fibrillation: Secondary | ICD-10-CM

## 2016-07-10 DIAGNOSIS — I4821 Permanent atrial fibrillation: Secondary | ICD-10-CM

## 2016-07-10 DIAGNOSIS — Z5181 Encounter for therapeutic drug level monitoring: Secondary | ICD-10-CM

## 2016-07-10 LAB — POCT INR: INR: 5.8

## 2016-07-10 NOTE — Progress Notes (Signed)
I have reviewed and agree with the plan. 

## 2016-07-10 NOTE — Patient Instructions (Signed)
Pre visit review using our clinic review tool, if applicable. No additional management support is needed unless otherwise documented below in the visit note. 

## 2016-07-14 ENCOUNTER — Encounter: Payer: Self-pay | Admitting: Physician Assistant

## 2016-07-24 ENCOUNTER — Other Ambulatory Visit (INDEPENDENT_AMBULATORY_CARE_PROVIDER_SITE_OTHER): Payer: Medicare HMO

## 2016-07-24 ENCOUNTER — Ambulatory Visit (INDEPENDENT_AMBULATORY_CARE_PROVIDER_SITE_OTHER): Payer: Medicare HMO | Admitting: General Practice

## 2016-07-24 ENCOUNTER — Ambulatory Visit (INDEPENDENT_AMBULATORY_CARE_PROVIDER_SITE_OTHER): Payer: Medicare HMO | Admitting: Internal Medicine

## 2016-07-24 ENCOUNTER — Encounter: Payer: Self-pay | Admitting: Internal Medicine

## 2016-07-24 VITALS — BP 134/78 | HR 50 | Temp 98.7°F | Resp 14 | Ht 67.0 in | Wt 272.0 lb

## 2016-07-24 DIAGNOSIS — Z5181 Encounter for therapeutic drug level monitoring: Secondary | ICD-10-CM

## 2016-07-24 DIAGNOSIS — I482 Chronic atrial fibrillation: Secondary | ICD-10-CM

## 2016-07-24 DIAGNOSIS — I872 Venous insufficiency (chronic) (peripheral): Secondary | ICD-10-CM

## 2016-07-24 DIAGNOSIS — I4821 Permanent atrial fibrillation: Secondary | ICD-10-CM

## 2016-07-24 LAB — COMPREHENSIVE METABOLIC PANEL
ALT: 6 U/L (ref 0–35)
AST: 10 U/L (ref 0–37)
Albumin: 3.7 g/dL (ref 3.5–5.2)
Alkaline Phosphatase: 88 U/L (ref 39–117)
BILIRUBIN TOTAL: 0.8 mg/dL (ref 0.2–1.2)
BUN: 17 mg/dL (ref 6–23)
CALCIUM: 9 mg/dL (ref 8.4–10.5)
CO2: 32 mEq/L (ref 19–32)
CREATININE: 1.54 mg/dL — AB (ref 0.40–1.20)
Chloride: 103 mEq/L (ref 96–112)
GFR: 42.02 mL/min — AB (ref >60.00)
Glucose, Bld: 92 mg/dL (ref 70–99)
Potassium: 3.5 mEq/L (ref 3.5–5.1)
Sodium: 141 mEq/L (ref 135–145)
TOTAL PROTEIN: 8 g/dL (ref 6.0–8.3)

## 2016-07-24 LAB — CBC
HCT: 33.1 % — ABNORMAL LOW (ref 36.0–46.0)
Hemoglobin: 11 g/dL — ABNORMAL LOW (ref 12.0–15.0)
MCHC: 33.2 g/dL (ref 30.0–36.0)
MCV: 88.3 fl (ref 78.0–100.0)
Platelets: 158 10*3/uL (ref 150.0–400.0)
RBC: 3.75 Mil/uL — AB (ref 3.87–5.11)
RDW: 15.6 % — AB (ref 11.5–15.5)
WBC: 5.9 10*3/uL (ref 4.0–10.5)

## 2016-07-24 LAB — POCT INR: INR: 3

## 2016-07-24 LAB — BRAIN NATRIURETIC PEPTIDE: Pro B Natriuretic peptide (BNP): 87 pg/mL (ref 0.0–100.0)

## 2016-07-24 NOTE — Progress Notes (Signed)
Pre visit review using our clinic review tool, if applicable. No additional management support is needed unless otherwise documented below in the visit note. 

## 2016-07-24 NOTE — Patient Instructions (Signed)
Pre visit review using our clinic review tool, if applicable. No additional management support is needed unless otherwise documented below in the visit note. 

## 2016-07-24 NOTE — Patient Instructions (Addendum)
We will check the labs today and call you back about the results.  Change the second lasix to with lunchtime to help with the night time.    DASH Eating Plan DASH stands for "Dietary Approaches to Stop Hypertension." The DASH eating plan is a healthy eating plan that has been shown to reduce high blood pressure (hypertension). It may also reduce your risk for type 2 diabetes, heart disease, and stroke. The DASH eating plan may also help with weight loss. What are tips for following this plan? General guidelines   Avoid eating more than 2,300 mg (milligrams) of salt (sodium) a day. If you have hypertension, you may need to reduce your sodium intake to 1,500 mg a day.  Limit alcohol intake to no more than 1 drink a day for nonpregnant women and 2 drinks a day for men. One drink equals 12 oz of beer, 5 oz of wine, or 1 oz of hard liquor.  Work with your health care provider to maintain a healthy body weight or to lose weight. Ask what an ideal weight is for you.  Get at least 30 minutes of exercise that causes your heart to beat faster (aerobic exercise) most days of the week. Activities may include walking, swimming, or biking.  Work with your health care provider or diet and nutrition specialist (dietitian) to adjust your eating plan to your individual calorie needs. Reading food labels   Check food labels for the amount of sodium per serving. Choose foods with less than 5 percent of the Daily Value of sodium. Generally, foods with less than 300 mg of sodium per serving fit into this eating plan.  To find whole grains, look for the word "whole" as the first word in the ingredient list. Shopping   Buy products labeled as "low-sodium" or "no salt added."  Buy fresh foods. Avoid canned foods and premade or frozen meals. Cooking   Avoid adding salt when cooking. Use salt-free seasonings or herbs instead of table salt or sea salt. Check with your health care provider or pharmacist before  using salt substitutes.  Do not fry foods. Cook foods using healthy methods such as baking, boiling, grilling, and broiling instead.  Cook with heart-healthy oils, such as olive, canola, soybean, or sunflower oil. Meal planning    Eat a balanced diet that includes:  5 or more servings of fruits and vegetables each day. At each meal, try to fill half of your plate with fruits and vegetables.  Up to 6-8 servings of whole grains each day.  Less than 6 oz of lean meat, poultry, or fish each day. A 3-oz serving of meat is about the same size as a deck of cards. One egg equals 1 oz.  2 servings of low-fat dairy each day.  A serving of nuts, seeds, or beans 5 times each week.  Heart-healthy fats. Healthy fats called Omega-3 fatty acids are found in foods such as flaxseeds and coldwater fish, like sardines, salmon, and mackerel.  Limit how much you eat of the following:  Canned or prepackaged foods.  Food that is high in trans fat, such as fried foods.  Food that is high in saturated fat, such as fatty meat.  Sweets, desserts, sugary drinks, and other foods with added sugar.  Full-fat dairy products.  Do not salt foods before eating.  Try to eat at least 2 vegetarian meals each week.  Eat more home-cooked food and less restaurant, buffet, and fast food.  When eating at  a restaurant, ask that your food be prepared with less salt or no salt, if possible. What foods are recommended? The items listed may not be a complete list. Talk with your dietitian about what dietary choices are best for you. Grains  Whole-grain or whole-wheat bread. Whole-grain or whole-wheat pasta. Brown rice. Orpah Cobb. Bulgur. Whole-grain and low-sodium cereals. Pita bread. Low-fat, low-sodium crackers. Whole-wheat flour tortillas. Vegetables  Fresh or frozen vegetables (raw, steamed, roasted, or grilled). Low-sodium or reduced-sodium tomato and vegetable juice. Low-sodium or reduced-sodium tomato  sauce and tomato paste. Low-sodium or reduced-sodium canned vegetables. Fruits  All fresh, dried, or frozen fruit. Canned fruit in natural juice (without added sugar). Meat and other protein foods  Skinless chicken or [place]. Ground chicken or [place]. Pork with fat trimmed off. Fish and seafood. Egg whites. Dried beans, peas, or lentils. Unsalted nuts, nut butters, and seeds. Unsalted canned beans. Lean cuts of beef with fat trimmed off. Low-sodium, lean deli meat. Dairy  Low-fat (1%) or fat-free (skim) milk. Fat-free, low-fat, or reduced-fat cheeses. Nonfat, low-sodium ricotta or cottage cheese. Low-fat or nonfat yogurt. Low-fat, low-sodium cheese. Fats and oils  Soft margarine without trans fats. Vegetable oil. Low-fat, reduced-fat, or light mayonnaise and salad dressings (reduced-sodium). Canola, safflower, olive, soybean, and sunflower oils. Avocado. Seasoning and other foods  Herbs. Spices. Seasoning mixes without salt. Unsalted popcorn and pretzels. Fat-free sweets. What foods are not recommended? The items listed may not be a complete list. Talk with your dietitian about what dietary choices are best for you. Grains  Baked goods made with fat, such as croissants, muffins, or some breads. Dry pasta or rice meal packs. Vegetables  Creamed or fried vegetables. Vegetables in a cheese sauce. Regular canned vegetables (not low-sodium or reduced-sodium). Regular canned tomato sauce and paste (not low-sodium or reduced-sodium). Regular tomato and vegetable juice (not low-sodium or reduced-sodium). Rosita Fire. Olives. Fruits  Canned fruit in a light or heavy syrup. Fried fruit. Fruit in cream or butter sauce. Meat and other protein foods  Fatty cuts of meat. Ribs. Fried meat. Tomasa Blase. Sausage. Bologna and other processed lunch meats. Salami. Fatback. Hotdogs. Bratwurst. Salted nuts and seeds. Canned beans with added salt. Canned or smoked fish. Whole eggs or egg yolks. Chicken or [place] with  skin. Dairy  Whole or 2% milk, cream, and half-and-half. Whole or full-fat cream cheese. Whole-fat or sweetened yogurt. Full-fat cheese. Nondairy creamers. Whipped toppings. Processed cheese and cheese spreads. Fats and oils  Butter. Stick margarine. Lard. Shortening. Ghee. Bacon fat. Tropical oils, such as coconut, palm kernel, or palm oil. Seasoning and other foods  Salted popcorn and pretzels. Onion salt, garlic salt, seasoned salt, table salt, and sea salt. Worcestershire sauce. Tartar sauce. Barbecue sauce. Teriyaki sauce. Soy sauce, including reduced-sodium. Steak sauce. Canned and packaged gravies. Fish sauce. Oyster sauce. Cocktail sauce. Horseradish that you find on the shelf. Ketchup. Mustard. Meat flavorings and tenderizers. Bouillon cubes. Hot sauce and Tabasco sauce. Premade or packaged marinades. Premade or packaged taco seasonings. Relishes. Regular salad dressings. Where to find more information:  National Heart, Lung, and Blood Institute: PopSteam.is  American Heart Association: www.heart.org Summary  The DASH eating plan is a healthy eating plan that has been shown to reduce high blood pressure (hypertension). It may also reduce your risk for type 2 diabetes, heart disease, and stroke.  With the DASH eating plan, you should limit salt (sodium) intake to 2,300 mg a day. If you have hypertension, you may need to reduce your sodium intake to  1,500 mg a day.  When on the DASH eating plan, aim to eat more fresh fruits and vegetables, whole grains, lean proteins, low-fat dairy, and heart-healthy fats.  Work with your health care provider or diet and nutrition specialist (dietitian) to adjust your eating plan to your individual calorie needs. This information is not intended to replace advice given to you by your health care provider. Make sure you discuss any questions you have with your health care provider. Document Released: 03/12/2011 Document Revised: 03/16/2016 Document  Reviewed: 03/16/2016 Elsevier Interactive Patient Education  2017 [employer].

## 2016-07-24 NOTE — Progress Notes (Signed)
I have reviewed and agree with the plan. 

## 2016-07-24 NOTE — Progress Notes (Signed)
   Subjective:    Patient ID: Robin Snow, female    DOB: 06/19/1940, 76 y.o.   MRN: [id number]  HPI The patient is a 76 YO female coming in for swelling in her legs. She is taking lasix twice a day and usually takes in the morning and the evening. She is having problems with the evening time going to the bathroom many times in the middle of the night. She denies change to diet but typically eats some bacon with breakfast and deli meats for lunch, dinner more variable. She does try to prop her legs up when sitting. She has had compression stockings in the past which were too tight and she refuses to try them again. Legs more swollen in the last 2 weeks but no change to SOB.   Review of Systems  Constitutional: Positive for fatigue. Negative for activity change, appetite change, diaphoresis, fever and unexpected weight change.  Respiratory: Negative.   Cardiovascular: Positive for leg swelling. Negative for chest pain and palpitations.  Gastrointestinal: Negative.   Musculoskeletal: Positive for arthralgias, gait problem, joint swelling and myalgias.  Skin: Negative.   Neurological: Positive for weakness and numbness. Negative for dizziness and facial asymmetry.       Unchanged      Objective:   Physical Exam  Constitutional: She appears well-developed and well-nourished.  HENT:  Head: Normocephalic and atraumatic.  Eyes: EOM are normal.  Neck: Normal range of motion.  Cardiovascular: Normal rate.   Pulmonary/Chest: Effort normal and breath sounds normal.  Abdominal: Soft.  Musculoskeletal: She exhibits edema.  2+ edema to knees bilaterally, pitting. No skin breakdown or ulcers.   Neurological: Coordination abnormal.  Skin: Skin is warm and dry.   Vitals:   07/24/16 0940  BP: 134/78  Pulse: (!) 50  Resp: 14  Temp: 98.7 F (37.1 C)  TempSrc: Oral  SpO2: 98%  Weight: 272 lb (123.4 kg)  Height:  (1.702 m)      Assessment & Plan:

## 2016-07-24 NOTE — Assessment & Plan Note (Signed)
Echo in January without signs of heart failure. Checking BNP (elevated in January without heart failure) and CMP and CBC for cause. Talked to her about sodium in foods and especially cured meats and she agrees to cut back on those. She declines compression stockings. Hesitant to increase lasix without kidney monitoring with her recent renal infarct and CKD stage 3. Because of bilateral knee replacement and stroke she will not be fluid free due to lack of activity. On anticoagulation making DVT very unlikely.

## 2016-07-27 ENCOUNTER — Other Ambulatory Visit: Payer: Self-pay | Admitting: Family Medicine

## 2016-07-27 NOTE — Telephone Encounter (Signed)
I will defer this to Dr. Okey Dupre, her PCP

## 2016-08-03 ENCOUNTER — Encounter (INDEPENDENT_AMBULATORY_CARE_PROVIDER_SITE_OTHER): Payer: Self-pay

## 2016-08-03 ENCOUNTER — Encounter: Payer: Self-pay | Admitting: Physician Assistant

## 2016-08-03 ENCOUNTER — Ambulatory Visit (INDEPENDENT_AMBULATORY_CARE_PROVIDER_SITE_OTHER): Payer: Medicare HMO | Admitting: Physician Assistant

## 2016-08-03 VITALS — BP 146/98 | HR 96 | Ht 67.0 in | Wt 267.8 lb

## 2016-08-03 DIAGNOSIS — N183 Chronic kidney disease, stage 3 unspecified: Secondary | ICD-10-CM

## 2016-08-03 DIAGNOSIS — R600 Localized edema: Secondary | ICD-10-CM

## 2016-08-03 DIAGNOSIS — I482 Chronic atrial fibrillation, unspecified: Secondary | ICD-10-CM

## 2016-08-03 DIAGNOSIS — R001 Bradycardia, unspecified: Secondary | ICD-10-CM | POA: Diagnosis not present

## 2016-08-03 DIAGNOSIS — I272 Pulmonary hypertension, unspecified: Secondary | ICD-10-CM

## 2016-08-03 DIAGNOSIS — R42 Dizziness and giddiness: Secondary | ICD-10-CM | POA: Diagnosis not present

## 2016-08-03 MED ORDER — POTASSIUM CHLORIDE CRYS ER 20 MEQ PO TBCR: 20.0000 meq | EXTENDED_RELEASE_TABLET | Freq: Two times a day (BID) | ORAL | 5 refills | Status: DC

## 2016-08-03 MED ORDER — METOLAZONE 2.5 MG PO TABS: ORAL_TABLET | ORAL | 0 refills | Status: DC

## 2016-08-03 MED ORDER — FUROSEMIDE 80 MG PO TABS: 80.0000 mg | ORAL_TABLET | Freq: Two times a day (BID) | ORAL | 5 refills | Status: DC

## 2016-08-03 NOTE — Patient Instructions (Addendum)
Medication Instructions:  Your physician has recommended you make the following change in your medication:  1) INCREASE Lasix to  twice daily 2) INCREASE Potassium to 20 mEq twice daily 3) START Metolazone 2.5mg  on Tues and Fridays 30 minutes before taking Lasix   Labwork: Bmet, Tsh, Cbc today  Your physician recommends that you return for lab work in: 1 week (Bmet)    Testing/Procedures: Your physician has recommended that you wear a holter monitor. Holter monitors are medical devices that record the heart's electrical activity. Doctors most often use these monitors to diagnose arrhythmias. Arrhythmias are problems with the speed or rhythm of the heartbeat. The monitor is a small, portable device. You can wear one while you do your normal daily activities. This is usually used to diagnose what is causing palpitations/syncope (passing out).   Follow-Up: Your physician recommends that you schedule a follow-up appointment in: 2 weeks with Dr.Mcalhany or Dayna Dunn, PA-C, or [facility]   Any Other Special Instructions Will Be Listed Below (If Applicable).     If you need a refill on your cardiac medications before your next appointment, please call your pharmacy.              For patients with swelling/edema, we give them these special instructions:  1. Follow a low-salt diet and watch your fluid intake. In general, you should not be taking in more than 2 liters of fluid per day (no more than 8 glasses per day). Some patients are restricted to less than 1.5 liters of fluid per day (no more than 6 glasses per day). This includes sources of water in foods like soup, coffee, tea, milk, etc. 2. Weigh yourself on the same scale at same time of day and keep a log. 3. Call your doctor: (Anytime you feel any of the following symptoms)  - 3-4 pound weight gain in 1-2 days or 2 pounds overnight  - Shortness of breath, with or without a dry hacking cough  - Swelling in the  hands, feet or stomach  - If you have to sleep on extra pillows at night in order to breathe   IT IS IMPORTANT TO LET YOUR DOCTOR KNOW EARLY ON IF YOU ARE HAVING SYMPTOMS SO WE CAN HELP YOU!

## 2016-08-03 NOTE — Progress Notes (Signed)
Cardiology Office Note    Date:  08/03/2016  ID:  Robin Snow, Robin Snow 1940-07-21, MRN [id number] PCP:  [name], MD  Cardiologist:  Clifton James   Chief Complaint: f/u atrial fib   History of Present Illness:  Robin Snow is a 76 y.o. female with history of chronic atrial fib, CVA, morbid obesity, HTN, DM2, chronic significant LEE/chronic venous insufficiency, dyslipidemia, anemia, pulm HTN by echo, possible renal infarct 04/2016 with probable resultant CKD III who presents for routine follow-up of above issues. She was remotely on Coumadin but this was stopped due to GIB. She was admitted with CVA 2015 and was started on Eliquis. In 03/2016 she was switched from Eliquis back to Coumadin by primary care due to the fact that Eliquis was costing over $500/mo. In 04/2016 she was admitted with left flank pain felt due to pyelonephritis versus possible renal infarct. The situation was discussed with Dr. Clelia Croft with hematology who did not feel this was a Coumadin failure as it may be attributed to subtherapeutic INR in December. She also had AKI prompting d/c of her lisnopril and HCTZ. 2D echo 04/16/16: EF 65-70%, no RWMA, mod MR, severe LAE, mod-severe RAE, PASP . Other pertinent studies include 1) remote cath 2001 EF 70-75% without CAD, 2) carotid duplex 2015: 1-39% BICA, and 3) normal nuclear stress test in 2012. I saw her and her daughter [name] in 04/2016 shortly after the infarct. She saw [name] PA-C on 05/11/16 at whicht ime she was in sinus bradycardia therefore diltiazem was decreased to  daily. She was referred for sleep study given her pulm HTN. Most recent labs 07/2016 include BNP 87, Hgb 11 (baseline 10-12), K 3.5, Cr 1.54 (recent baseline 1.4-1.8).  The patient returns for follow-up today with her daughter. Overall she says she's been doing "ok" but for the last several days has noticed intermittent episodes of dizziness. They seem to happen spontaneously, regardless  of activity. She feels lightheaded for a few seconds and then it resolves. She's not had any syncope. No chest pain. She does get occasional palpitations. She continues to struggle with massive lower extremity edema which she feels is tighter today. At time of labs above she had actually run out of her potassium. She has been taking Lasix  BID without significant relief. She was surprised to learn recently that bologna and lunch meat had sodium.   Past Medical History:  Diagnosis Date  . AKI (acute kidney injury) (HCC) 04/2016  . Anemia   . Arthritis   . Chronic atrial fibrillation (HCC)   . Chronic edema   . Chronic venous insufficiency   . CKD (chronic kidney disease), stage III   . Coagulopathy (HCC)   . Diabetes mellitus    Type 2  . Diverticulosis   . Dyslipidemia   . GERD (gastroesophageal reflux disease)   . GI bleed 2015   a. lower GI from diverticular source 2015.  [name] Hx of transfusion of packed red blood cells   . Hypertension   . Morbid obesity (HCC)   . Pulmonary hypertension (HCC)   . Pyelonephritis 04/2016  . Renal infarct (HCC)    a. 04/2016- adm with flank pain, ? pyelo vs renal infarct in setting of recent subtherapeutic INR.  [name] Sinus bradycardia   . Stroke [facility]) 2015    Past Surgical History:  Procedure Laterality Date  . CESAREAN SECTION    . COLONOSCOPY Left 01/24/2013   Procedure: COLONOSCOPY;  Surgeon: Willis Modena, MD;  Location: [facility] ENDOSCOPY;  Service: Endoscopy;  Laterality: Left;  . ESOPHAGOGASTRODUODENOSCOPY Left 01/23/2013   Procedure: ESOPHAGOGASTRODUODENOSCOPY (EGD);  Surgeon: Willis Modena, MD;  Location: [facility] ENDOSCOPY;  Service: Endoscopy;  Laterality: Left;  . EYE SURGERY Bilateral    cataract surgery  . JOINT REPLACEMENT    . REPLACEMENT TOTAL KNEE BILATERAL    . TUBAL LIGATION      Current Medications: Current Outpatient Prescriptions  Medication Sig Dispense Refill  . diltiazem (CARDIZEM CD) 120 MG 24 hr capsule Take 1 capsule  (120 mg total) by mouth daily. 90 capsule 3  . ferrous sulfate 325 (65 FE) MG EC tablet TAKE ONE TABLET BY MOUTH TWICE DAILY 60 tablet 6  . furosemide (LASIX) 80 MG tablet Take 1 tablet (80 mg total) by mouth daily as needed for fluid. 30 tablet 3  . gabapentin (NEURONTIN) 100 MG capsule TAKE 1 CAPSULE EVERY NIGHT AT BEDTIME 90 capsule 3  . gabapentin (NEURONTIN) 300 MG capsule TAKE 1 CAPSULE THREE TIMES DAILY 270 capsule 3  . lovastatin (MEVACOR) 20 MG tablet TAKE 1 TABLET EVERY NIGHT AT BEDTIME 90 tablet 2  . meclizine (ANTIVERT) 25 MG tablet Take 1 tablet (25 mg total) by mouth every 4 (four) hours as needed for dizziness. 60 tablet 0  . metoprolol (LOPRESSOR) 50 MG tablet Take 2 tablets (100 mg total) by mouth 2 (two) times daily. 120 tablet 0  . oxybutynin (DITROPAN) 5 MG tablet TAKE 1 TABLET EVERY DAY 90 tablet 3  . potassium chloride SA (K-DUR,KLOR-CON) 20 MEQ tablet Take 20 mEq by mouth daily.    [name] warfarin (COUMADIN) 5 MG tablet Take 1 tablet (5 mg total) by mouth daily. (Patient taking differently: Take 2.5-5 mg by mouth daily. Take 2.5mg  on Monday, Wednesday and Friday. All other days take .) 90 tablet 3   No current facility-administered medications for this visit.      Allergies:   Aspirin; Hydrocodone; and Rofecoxib   Social History   Social History  . Marital status: Widowed    Spouse name: [name]  . Number of children: 4  . Years of education: 28   Social History Main Topics  . Smoking status: Former Smoker    Types: Cigarettes    Quit date: 07/07/1993  . Smokeless tobacco: Never Used     Comment: .3 cig a day  . Alcohol use No     Comment: no longer drinks alcohol  . Drug use: No  . Sexual activity: Not Asked   Other Topics Concern  . None   Social History Narrative   Patient is widowed and has 4 living children, and 2 deceased children.   Patient is right handed.   Patient has hs education.   Patient drinks coffee and soda once a week.     Family History:   Family History  Problem Relation Age of Onset  . Cancer Father   . Stroke Maternal Aunt      ROS:   Please see the history of present illness. Denies any bleeding. Compliant with meds. All other systems are reviewed and otherwise negative.    PHYSICAL EXAM:   VS:  BP (!) 146/98   Pulse 96   Ht  (1.702 m)   Wt 267 lb 12.8 oz (121.5 kg)   BMI 41.94 kg/m   BMI: Body mass index is 41.94 kg/m. GEN: Well nourished, well developed morbidly obese AAF, in no acute distress  HEENT: normocephalic, atraumatic Neck: no JVD, carotid bruits, or  masses Cardiac: RRR; no murmurs, rubs, or gallops, massive bilateral lower extremity edema (chronic per patient)  Respiratory:  clear to auscultation bilaterally, normal work of breathing GI: soft, nontender, nondistended, + BS MS: no deformity or atrophy  Skin: warm and dry, no rash Neuro:  Alert and Oriented x 3, Strength and sensation are intact, follows commands Psych: euthymic mood, full affect  Wt Readings from Last 3 Encounters:  08/03/16 267 lb 12.8 oz (121.5 kg)  07/24/16 272 lb (123.4 kg)  05/11/16 270 lb (122.5 kg)      Studies/Labs Reviewed:   EKG:  EKG was ordered today and personally reviewed by me and demonstrates NSR 96bpm long first degree AVB nonspecific ST-T changes  Recent Labs: 07/24/2016: ALT 6; BUN 17; Creatinine, Ser 1.54; Hemoglobin 11.0; Platelets 158.0; Potassium 3.5; Pro B Natriuretic peptide (BNP) 87.0; Sodium 141   Lipid Panel    Component Value Date/Time   CHOL 129 04/26/2015 1649   TRIG 129.0 04/26/2015 1649   HDL 33.70 (L) 04/26/2015 1649   CHOLHDL 4 04/26/2015 1649   VLDL 25.8 04/26/2015 1649   LDLCALC 69 04/26/2015 1649    Additional studies/ records that were reviewed today include: Summarized above.    ASSESSMENT & PLAN:   1. Dizziness - brief and unprovoked, regardless of position. With history of both atrial fib and sinus bradycardia, need to evaluate for symptomatic bradycardia,  post-termination pauses or worsening tachycardia. Will order 48-hour holter monitor. The patient thinks symptoms are frequent enough to capture them in this timeframe. Her HR appears higher today than recent baseline will recheck CBC to exclude anemia as well as BMET and TSH. (She reports these symptoms are new compared to when she had bloodwork at PCP's office on 07/24/16.) ER precautions reviewed with patient. 2. Chronic atrial fib, more recently with sinus bradycardia - EKG reviewed with Dr. Excell Seltzer today who agrees this represents NSR with long first degree AV block. Continue Coumadin as managed by Coumadin clinic. F/u rhythm by Holter monitor. 3. Bilateral lower extremity edema - impressive edema is chronic for patient but worse than usual per her report. She's taking Lasix  BID without significant relief. Daughter feels they appear worse than usual. She's already been taking Lasix  BID. Will add low dose metolazone 2.5mg  twice a week on Tuesdays and Fridays to augment diuresis with careful attention to renal function. Recheck BMET 1 week. She's also been out of her potassium including when labs were checked recently with K of 3.5. Historically her potassium level is 4.5-4.8 while on of potassium. Will empirically increase to BID since we are adding metolazone. Reviewed importance of sodium restriction. She does not drink excessive fluid. 4. Pulmonary HTN - patient has not heard back yet about sleep study. Will ask nurse to help look into this. 5. CKD III - recheck BMET today and in 1 week given metolazone initiation. We discussed risks of electrolyte abnormalities and worsening renal function with medication which is why we need to keep a close eye on these things.  Disposition: F/u with me, [name] or Dr. Clifton James in 2 weeks.   Medication Adjustments/Labs and Tests Ordered: Current medicines are reviewed at length with the patient today.  Concerns regarding medicines are  outlined above. Medication changes, Labs and Tests ordered today are summarized above and listed in the Patient Instructions accessible in Encounters.   Thomasene Mohair PA-C  08/03/2016 3:04 PM    [facility] [address] [place],  , [place]  [address] Phone: [phone]; Fax: [phone]  

## 2016-08-04 ENCOUNTER — Telehealth: Payer: Self-pay | Admitting: *Deleted

## 2016-08-04 DIAGNOSIS — Z79899 Other long term (current) drug therapy: Secondary | ICD-10-CM

## 2016-08-04 LAB — BASIC METABOLIC PANEL
BUN / CREAT RATIO: 13 (ref 12–28)
BUN: 18 mg/dL (ref 8–27)
CALCIUM: 8.8 mg/dL (ref 8.7–10.3)
CHLORIDE: 102 mmol/L (ref 96–106)
CO2: 24 mmol/L (ref 18–29)
Creatinine, Ser: 1.37 mg/dL — ABNORMAL HIGH (ref 0.57–1.00)
GFR calc Af Amer: 43 mL/min/{1.73_m2} — ABNORMAL LOW (ref >59)
GFR calc non Af Amer: 37 mL/min/{1.73_m2} — ABNORMAL LOW (ref >59)
GLUCOSE: 87 mg/dL (ref 65–99)
Potassium: 3.5 mmol/L (ref 3.5–5.2)
Sodium: 142 mmol/L (ref 134–144)

## 2016-08-04 LAB — CBC WITH DIFFERENTIAL/PLATELET
BASOS ABS: 0 10*3/uL (ref 0.0–0.2)
Basos: 0 %
EOS (ABSOLUTE): 0.1 10*3/uL (ref 0.0–0.4)
Eos: 2 %
HEMOGLOBIN: 10.5 g/dL — AB (ref 11.1–15.9)
Hematocrit: 32.2 % — ABNORMAL LOW (ref 34.0–46.6)
Immature Grans (Abs): 0 10*3/uL (ref 0.0–0.1)
Immature Granulocytes: 0 %
LYMPHS ABS: 2.1 10*3/uL (ref 0.7–3.1)
LYMPHS: 35 %
MCH: 28.7 pg (ref 26.6–33.0)
MCHC: 32.6 g/dL (ref 31.5–35.7)
MCV: 88 fL (ref 79–97)
MONOCYTES: 10 %
Monocytes Absolute: 0.6 10*3/uL (ref 0.1–0.9)
Neutrophils Absolute: 3.3 10*3/uL (ref 1.4–7.0)
Neutrophils: 53 %
PLATELETS: 162 10*3/uL (ref 150–379)
RBC: 3.66 x10E6/uL — AB (ref 3.77–5.28)
RDW: 16.1 % — AB (ref 12.3–15.4)
WBC: 6 10*3/uL (ref 3.4–10.8)

## 2016-08-04 LAB — TSH: TSH: 3.95 u[IU]/mL (ref 0.450–4.500)

## 2016-08-04 NOTE — Telephone Encounter (Signed)
-----   Message from Laurann [place], [place] sent at 08/04/2016  7:57 AM EDT ----- Please let patient know labs are stable except blood count is down slightly compared to prior. However, this appears similar to 04/2016.  [name], please add a CBC for when she returns for her BMET. Continue plan as discussed yesterday with trial of metolazone twice a week. On the days when she takes the metolazone (Tuesdays and Fridays) she should take 1 extra potassium with this. (We increased KCl yesterday to BID, so this would be 1 extra potassium on top of that on metolazone days). I will forward to PCP for further review to determine if she wants to do any stool cards since this patient is anticoagulated on Coumadin. Patient did report dark stools but this has been present for her for a while since being on iron. Dayna Dunn PA-C

## 2016-08-04 NOTE — Telephone Encounter (Signed)
Tried to contact pt re: lab results. Her voicemail box was full and couldn't leave a message.

## 2016-08-05 NOTE — Telephone Encounter (Signed)
Spoke with pt and advised her of the recommendations, per Ronie Spies, PA-C, re: pt's CBC and blood count being abnormal.  Pt aware that we added a CBC to her lab order for Tuesday, 08/11/16. She has been advised to take an extra K+ on Tuesdays & Fridays when she takes the Metolazone. Pt agreeable to this plan and verbalized understanding.

## 2016-08-07 ENCOUNTER — Telehealth: Payer: Self-pay | Admitting: *Deleted

## 2016-08-07 NOTE — Telephone Encounter (Addendum)
Patient notified of sleep study scheduled for Friday October 09 2016. Patient understands she will receive a letter in the mail in a week or so. Patient understand to call if her letter does not reach her in a timely manner. Patient agrees with treatment plan and thanked me.

## 2016-08-07 NOTE — Telephone Encounter (Signed)
-----   Message from Jarvis NewcomerLisa S Parris-Godley, [place]MA sent at 08/03/2016  4:03 PM EDT ----- Regarding: patients sleep study Hello Coralee Northina, This patient was seen by Ronie Spiesayna Dunn, PA-C today. She has questions about her sleep study being scheduled. Could you please follow up with her.  Thanks, Misty StanleyLisa

## 2016-08-11 ENCOUNTER — Other Ambulatory Visit: Payer: Medicare HMO

## 2016-08-11 ENCOUNTER — Other Ambulatory Visit: Payer: Self-pay | Admitting: Physician Assistant

## 2016-08-11 ENCOUNTER — Encounter: Payer: Self-pay | Admitting: *Deleted

## 2016-08-11 DIAGNOSIS — R42 Dizziness and giddiness: Secondary | ICD-10-CM

## 2016-08-11 DIAGNOSIS — I4819 Other persistent atrial fibrillation: Secondary | ICD-10-CM

## 2016-08-11 NOTE — Progress Notes (Signed)
Robin Snow, can you please call patient to check on her since she did not show? Thanks. Mak Bonny PA-C

## 2016-08-11 NOTE — Progress Notes (Signed)
Patient ID: Robin Snow, female   DOB: November 26, 1940, 76 y.o.   MRN: [id number] Patient did not show up for 08/11/16, 2:30 PM, appointment, to have a 48 hour holter monitor applied.

## 2016-08-12 ENCOUNTER — Other Ambulatory Visit: Payer: Medicare HMO | Admitting: *Deleted

## 2016-08-12 ENCOUNTER — Encounter (INDEPENDENT_AMBULATORY_CARE_PROVIDER_SITE_OTHER): Payer: Self-pay

## 2016-08-12 DIAGNOSIS — Z79899 Other long term (current) drug therapy: Secondary | ICD-10-CM

## 2016-08-12 DIAGNOSIS — R42 Dizziness and giddiness: Secondary | ICD-10-CM

## 2016-08-12 DIAGNOSIS — R600 Localized edema: Secondary | ICD-10-CM

## 2016-08-12 LAB — BASIC METABOLIC PANEL
BUN / CREAT RATIO: 16 (ref 12–28)
BUN: 23 mg/dL (ref 8–27)
CHLORIDE: 95 mmol/L — AB (ref 96–106)
CO2: 28 mmol/L (ref 18–29)
Calcium: 9.1 mg/dL (ref 8.7–10.3)
Creatinine, Ser: 1.47 mg/dL — ABNORMAL HIGH (ref 0.57–1.00)
GFR calc non Af Amer: 34 mL/min/{1.73_m2} — ABNORMAL LOW (ref >59)
GFR, EST AFRICAN AMERICAN: 40 mL/min/{1.73_m2} — AB (ref >59)
GLUCOSE: 162 mg/dL — AB (ref 65–99)
POTASSIUM: 3.4 mmol/L — AB (ref 3.5–5.2)
Sodium: 142 mmol/L (ref 134–144)

## 2016-08-12 LAB — CBC
HEMATOCRIT: 35.2 % (ref 34.0–46.6)
Hemoglobin: 11.4 g/dL (ref 11.1–15.9)
MCH: 29.3 pg (ref 26.6–33.0)
MCHC: 32.4 g/dL (ref 31.5–35.7)
MCV: 91 fL (ref 79–97)
PLATELETS: 159 10*3/uL (ref 150–379)
RBC: 3.89 x10E6/uL (ref 3.77–5.28)
RDW: 15.6 % — AB (ref 12.3–15.4)
WBC: 5.3 10*3/uL (ref 3.4–10.8)

## 2016-08-12 NOTE — Progress Notes (Signed)
Called pt per Ronie Spiesayna Dunn, PA-C to check on her since she missed her monitor / lab appt 08/11/16. Per pt, she forgot all about the appt.  Pt will come in today for the lab work and I have sent a message to the scheduler to call and reschedule her monitor.  Pt very appreciative for the call.

## 2016-08-14 ENCOUNTER — Ambulatory Visit (INDEPENDENT_AMBULATORY_CARE_PROVIDER_SITE_OTHER): Payer: Medicare HMO | Admitting: General Practice

## 2016-08-14 DIAGNOSIS — I482 Chronic atrial fibrillation: Secondary | ICD-10-CM | POA: Diagnosis not present

## 2016-08-14 DIAGNOSIS — I4821 Permanent atrial fibrillation: Secondary | ICD-10-CM

## 2016-08-14 LAB — POCT INR: INR: 1.7

## 2016-08-14 NOTE — Patient Instructions (Signed)
Pre visit review using our clinic review tool, if applicable. No additional management support is needed unless otherwise documented below in the visit note. 

## 2016-08-14 NOTE — Progress Notes (Signed)
I have reviewed and agree with the plan. 

## 2016-08-17 ENCOUNTER — Ambulatory Visit (INDEPENDENT_AMBULATORY_CARE_PROVIDER_SITE_OTHER): Payer: Medicare HMO

## 2016-08-17 DIAGNOSIS — R42 Dizziness and giddiness: Secondary | ICD-10-CM

## 2016-08-17 DIAGNOSIS — I481 Persistent atrial fibrillation: Secondary | ICD-10-CM

## 2016-08-17 DIAGNOSIS — I4819 Other persistent atrial fibrillation: Secondary | ICD-10-CM

## 2016-08-18 ENCOUNTER — Encounter: Payer: Self-pay | Admitting: Physician Assistant

## 2016-08-19 ENCOUNTER — Encounter: Payer: Self-pay | Admitting: Physician Assistant

## 2016-08-19 ENCOUNTER — Telehealth: Payer: Self-pay | Admitting: *Deleted

## 2016-08-19 ENCOUNTER — Ambulatory Visit: Payer: Medicare HMO | Admitting: Physician Assistant

## 2016-08-19 VITALS — BP 128/70 | HR 64 | Ht 67.0 in | Wt 259.4 lb

## 2016-08-19 DIAGNOSIS — I272 Pulmonary hypertension, unspecified: Secondary | ICD-10-CM | POA: Diagnosis not present

## 2016-08-19 DIAGNOSIS — N183 Chronic kidney disease, stage 3 unspecified: Secondary | ICD-10-CM

## 2016-08-19 DIAGNOSIS — I10 Essential (primary) hypertension: Secondary | ICD-10-CM | POA: Diagnosis not present

## 2016-08-19 DIAGNOSIS — I872 Venous insufficiency (chronic) (peripheral): Secondary | ICD-10-CM | POA: Diagnosis not present

## 2016-08-19 DIAGNOSIS — E876 Hypokalemia: Secondary | ICD-10-CM

## 2016-08-19 DIAGNOSIS — I482 Chronic atrial fibrillation: Secondary | ICD-10-CM | POA: Diagnosis not present

## 2016-08-19 DIAGNOSIS — I4821 Permanent atrial fibrillation: Secondary | ICD-10-CM

## 2016-08-19 LAB — BASIC METABOLIC PANEL WITH GFR
BUN/Creatinine Ratio: 16 (ref 12–28)
BUN: 25 mg/dL (ref 8–27)
CO2: 25 mmol/L (ref 18–29)
Calcium: 9 mg/dL (ref 8.7–10.3)
Chloride: 95 mmol/L — ABNORMAL LOW (ref 96–106)
Creatinine, Ser: 1.57 mg/dL — ABNORMAL HIGH (ref 0.57–1.00)
GFR calc Af Amer: 37 mL/min/1.73 — ABNORMAL LOW
GFR calc non Af Amer: 32 mL/min/1.73 — ABNORMAL LOW
Glucose: 103 mg/dL — ABNORMAL HIGH (ref 65–99)
Potassium: 3.8 mmol/L (ref 3.5–5.2)
Sodium: 139 mmol/L (ref 134–144)

## 2016-08-19 MED ORDER — POTASSIUM CHLORIDE CRYS ER 20 MEQ PO TBCR: 20.0000 meq | EXTENDED_RELEASE_TABLET | ORAL | 3 refills | Status: DC

## 2016-08-19 NOTE — Telephone Encounter (Signed)
Per [name], [name] d/w Robin NewcomerScott Weaver PA monitor results today for the pt. In light of the the monitor results it has been advised pt see EP ASAP due to low heart rate per [name], [name] and Robin Snow, [name]. Robin called ma and stated that she and [name] [name] have not been able to reach the pt to advise of the monitor findings and of the appt that has been scheduled for her tomorrow with [name] [name] based on the urgent matter of the low heart rate. I advised [name] that I had also tried to reach the pt to go over lab results. Per myself and [name], [name] we will continue to try and reach the pt to advise of appt tomorrow with [name]. [name], [name] is in agreement with this plan. We have left numerous messages in regards trying to reach the pt. Pt phone vm is full, lmom on daughter phone and son phone is disconnected or not valid.

## 2016-08-19 NOTE — Telephone Encounter (Signed)
Per [name] PA-C, he reviewed the pts 48 hour holter monitor results with General Cardiologist, and recommendations were provided for the pt to be seen Urgently by one of our EP Physicians.  Dr Klein agreed to see the pt tomorrow 08/20/16 at 0815 at our [place] location.  Melissa Tatum has tried multiple times to reach the pt with no answer and VM is full and unable to leave further messages.  Melissa is calling to endorse urgent appt scheduled for tomorrow with Dr Klein, at our [place] office.  Melissa [name] has tried calling Daughter, as listed on emergency contact, and she has the same number as the pt, and no answer from her and VM still full.  Melissa tried calling the pts Son, and his number has been disconnected.  I have tried multiple times to contact the pt and emergency contacts with no answer and noted full VM.  Scheduling to follow-up with the pt until 5pm.  Per Supervisor [name] RN, a spot is to be held for this pt on next Monday with Dr Camnitz in EP, at 3 pm.  Melissa to hold this time and follow-up with the pt accordingly.  [name] is aware that pt cannot be contacted and emergency contacts are not able to be reached as well.  Per Scott, if pt calls back, she is to be instructed to go to the ER if she becomes dizzy, has pre-syncopal or syncopal episodes, and come in as planned for which appt scheduling can confirm her too.  Will route this to Dr McAlhany and RN for their review and further follow-up.  [name]'s CMA to also try to continue following up with the pt as well.  

## 2016-08-19 NOTE — Telephone Encounter (Signed)
[facility] monitoring service is calling to inform Dr. Clifton JamesMcAlhany and Tereso NewcomerScott Weaver PA-C (saw the pt in clinic today), that the pts 48 hour holter monitor has been processed and faxed report will be sent, showing that the pt had a minimum HR of 22 BPM and noted 3 sec pause.  Faxed report received and gave this to IAC/InterActiveCorpScott Weaver PA-C, for he saw the pt in clinic today, and is waiting for these results.  Scott to follow-up with pts Primary Cardiologist, Dr. Clifton JamesMcAlhany, for further review and recommendation of report.  Pt to receive follow-up accordingly, based on recommendations provided.

## 2016-08-19 NOTE — Telephone Encounter (Signed)
I spoke with pt and her daughter [name]([name]) and reviewed monitor results.  I told them of recommendation to [place] [name] tomorrow at 8:15.  They report pt will be there for this appointment.  [facility] to pt's daughter.  I instructed daughter pt should go to ED if she felt light headed or as if she may pass out prior to this appointment.

## 2016-08-19 NOTE — Telephone Encounter (Signed)
Per Tereso NewcomerScott Weaver PA-C, he reviewed the pts 48 hour holter monitor results with General Cardiologist, and recommendations were provided for the pt to be seen Urgently by one of our EP Physicians.  Dr Graciela HusbandsKlein agreed to see the pt tomorrow 08/20/16 at 0815 at our [place] location.  [name] has tried multiple times to reach the pt with no answer and VM is full and unable to leave further messages.  Melissa is calling to endorse urgent appt scheduled for tomorrow with Dr Graciela HusbandsKlein, at our [place] office.  Melissa [name] has tried calling Daughter, as listed on emergency contact, and she has the same number as the pt, and no answer from her and VM still full.  Melissa tried calling the pts Son, and his number has been disconnected.  I have tried multiple times to contact the pt and emergency contacts with no answer and noted full VM.  Scheduling to follow-up with the pt until 5pm.  Per Supervisor Tresa EndoKelly RN, a spot is to be held for this pt on next Monday with Dr Elberta Fortisamnitz in EP, at 3 pm.  Efraim KaufmannMelissa to hold this time and follow-up with the pt accordingly.  Tereso NewcomerScott Weaver is aware that pt cannot be contacted and emergency contacts are not able to be reached as well.  Per Lorin PicketScott, if pt calls back, she is to be instructed to go to the ER if she becomes dizzy, has pre-syncopal or syncopal episodes, and come in as planned for which appt scheduling can confirm her too.  Will route this to Dr Clifton JamesMcAlhany and RN for their review and further follow-up.  [name]'s CMA to also try to continue following up with the pt as well.

## 2016-08-19 NOTE — Progress Notes (Signed)
Cardiology Office Note:    Date:  08/19/2016   ID:  Robin, Snow July 04, 1940, MRN [id number]  PCP:  [name], MD  Cardiologist:  Dr. Verne Carrow    Referring MD: [name], *   Chief Complaint  Patient presents with  . Follow-up    edema; AFib    History of Present Illness:    Robin Snow is a 76 y.o. female with a hx of permanent AF, prior stroke, morbid obeisty, HTN, DM2, chronic LE edema 2/2 venous insufficiency, HL, anemia.  She was taken off of Coumadin in the past due to GI bleeding.  She was ultimately placed on Eliquis.  She was changed from Eliquis back to Coumadin in 03/2016 due to cost.  She was then admitted in 1/18 with left flank pain and a UTI. CT scan was notable for left renal infarct that was likely cardio-embolic (INR had been sub-therapeutic recently).    Last seen by Ronie Spies, PA-C 08/03/16. She complained of dizziness and her LE edema was worse.  Metolazone twice a week was added and she was set up for a 48 Hr Holter.  Labs indicated fairly stable hemoglobin and renal function.  She returns for follow up.  She is here with her daughter.  She notes some improvement in her LE edema.  She denies significant dyspnea.  She denies chest pain.  She denies syncope.     Prior CV studies:   The following studies were reviewed today:  Echo 04/16/16 EF 65-70, normal wall motion, moderate MR, severe LAE, moderate to severe RAE, PASP 52   Carotid US 11/30/13 Bilateral ICA 1-39   Echo 8/15 Mild LVH, EF 60-65, normal wall motion, severe BAE, PASP 33   Myoview 1/12 1.  No evidence for pharmacologically induced myocardial ischemia. 2.  Left ventricular ejection fraction equals 65%. 3.  Normal left ventricular systolic function.   LHC 08/1999 EF 70-75 Normal coronary arteries   Past Medical History:  Diagnosis Date  . AKI (acute kidney injury) (HCC) 04/2016  . Anemia   . Arthritis   . Chronic atrial fibrillation (HCC)     . Chronic edema   . Chronic venous insufficiency   . CKD (chronic kidney disease), stage III   . Coagulopathy (HCC)   . Diabetes mellitus    Type 2  . Diverticulosis   . Dyslipidemia   . GERD (gastroesophageal reflux disease)   . GI bleed 2015   a. lower GI from diverticular source 2015.  [name] Hx of transfusion of packed red blood cells   . Hypertension   . Morbid obesity (HCC)   . Pulmonary hypertension (HCC)   . Pyelonephritis 04/2016  . Renal infarct (HCC)    a. 04/2016- adm with flank pain, ? pyelo vs renal infarct in setting of recent subtherapeutic INR.  [name] Sinus bradycardia   . Stroke [facility]) 2015    Past Surgical History:  Procedure Laterality Date  . CESAREAN SECTION    . COLONOSCOPY Left 01/24/2013   Procedure: COLONOSCOPY;  Surgeon: Willis Modena, MD;  Location: [facility] ENDOSCOPY;  Service: Endoscopy;  Laterality: Left;  . ESOPHAGOGASTRODUODENOSCOPY Left 01/23/2013   Procedure: ESOPHAGOGASTRODUODENOSCOPY (EGD);  Surgeon: Willis Modena, MD;  Location: [facility] ENDOSCOPY;  Service: Endoscopy;  Laterality: Left;  . EYE SURGERY Bilateral    cataract surgery  . JOINT REPLACEMENT    . REPLACEMENT TOTAL KNEE BILATERAL    . TUBAL LIGATION      Current Medications: Current  Meds  Medication Sig  . diltiazem (CARDIZEM CD) 120 MG 24 hr capsule Take 1 capsule (120 mg total) by mouth daily.  . ferrous sulfate 325 (65 FE) MG EC tablet TAKE ONE TABLET BY MOUTH TWICE DAILY  . furosemide (LASIX) 80 MG tablet Take 1 tablet (80 mg total) by mouth 2 (two) times daily.  [name]. gabapentin (NEURONTIN) 100 MG capsule TAKE 1 CAPSULE EVERY NIGHT AT BEDTIME  . gabapentin (NEURONTIN) 300 MG capsule TAKE 1 CAPSULE THREE TIMES DAILY  . lovastatin (MEVACOR) 20 MG tablet TAKE 1 TABLET EVERY NIGHT AT BEDTIME  . meclizine (ANTIVERT) 25 MG tablet Take 1 tablet (25 mg total) by mouth every 4 (four) hours as needed for dizziness.  . metolazone (ZAROXOLYN) 2.5 MG tablet Take 1 tablet 30 minutes before lasix on  Tuesday and Friday  . metoprolol (LOPRESSOR) 50 MG tablet Take 2 tablets (100 mg total) by mouth 2 (two) times daily.  [name]. oxybutynin (DITROPAN) 5 MG tablet TAKE 1 TABLET EVERY DAY  . warfarin (COUMADIN) 5 MG tablet Take 1 tablet (5 mg total) by mouth daily.  . [DISCONTINUED] potassium chloride SA (K-DUR,KLOR-CON) 20 MEQ tablet Take 1 tablet (20 mEq total) by mouth 2 (two) times daily.     Allergies:   Aspirin; Hydrocodone; and Rofecoxib   Social History   Social History  . Marital status: Widowed    Spouse name: [name]  . Number of children: 4  . Years of education: 7112   Social History Main Topics  . Smoking status: Former Smoker    Types: Cigarettes    Quit date: 07/07/1993  . Smokeless tobacco: Never Used     Comment: .3 cig a day  . Alcohol use No     Comment: no longer drinks alcohol  . Drug use: No  . Sexual activity: Not Asked   Other Topics Concern  . None   Social History Narrative   Patient is widowed and has 4 living children, and 2 deceased children.   Patient is right handed.   Patient has hs education.   Patient drinks coffee and soda once a week.     Family Hx: The patient's family history includes Cancer in her father; Stroke in her maternal aunt.  ROS:   Please see the history of present illness.    ROS All other systems reviewed and are negative.   EKGs/Labs/Other Test Reviewed:    EKG:  EKG is not ordered today.  The ekg ordered today demonstrates n/a  Recent Labs: 07/24/2016: ALT 6; Hemoglobin 11.0; Pro B Natriuretic peptide (BNP) 87.0 08/03/2016: TSH 3.950 08/12/2016: BUN 23; Creatinine, Ser 1.47; Platelets 159; Potassium 3.4; Sodium 142   Recent Lipid Panel    Component Value Date/Time   CHOL 129 04/26/2015 1649   TRIG 129.0 04/26/2015 1649   HDL 33.70 (L) 04/26/2015 1649   CHOLHDL 4 04/26/2015 1649   VLDL 25.8 04/26/2015 1649   LDLCALC 69 04/26/2015 1649     Physical Exam:    VS:  BP 128/70   Pulse 64   Ht 5\' 7"  (1.702 m)   Wt 259 lb  6.4 oz (117.7 kg)   SpO2 96%   BMI 40.63 kg/m     Wt Readings from Last 3 Encounters:  08/19/16 259 lb 6.4 oz (117.7 kg)  08/03/16 267 lb 12.8 oz (121.5 kg)  07/24/16 272 lb (123.4 kg)     Physical Exam  Constitutional: She is oriented to person, place, and time. She appears well-developed  and well-nourished. No distress.  HENT:  Head: Normocephalic and atraumatic.  Eyes: No scleral icterus.  Neck: Normal range of motion. No JVD (I cannot appreciate JVD at 90 degrees) present.  Cardiovascular: Normal rate, regular rhythm, S1 normal, S2 normal and normal heart sounds.   No murmur heard. Pulmonary/Chest: Breath sounds normal. She has no wheezes. She has no rhonchi. She has no rales.  Abdominal: Soft. There is no tenderness.  Musculoskeletal: She exhibits edema (1+ bilat LE edema).  Neurological: She is alert and oriented to person, place, and time.  Skin: Skin is warm and dry.  Psychiatric: She has a normal mood and affect.    ASSESSMENT:    1. Permanent atrial fibrillation (HCC)   2. Chronic venous insufficiency   3. Pulmonary HTN (HCC)   4. CKD (chronic kidney disease), stage III   5. Essential hypertension   6. Hypokalemia    PLAN:    In order of problems listed above:  1. Permanent atrial fibrillation (HCC) - Coumadin is managed by PCP.  By exam today, she seems to be in NSR.  Holter monitor was just turned in today. She did not have any dizziness while wearing this. But, this AM, she did have some mild dizziness.   -  Consider Event monitor if Holter ok and dizziness continues  2. Chronic venous insufficiency - Overall improved.  Likely multifactorial.  Continue current diuretic dose.  Repeat BMET today.  3. Pulmonary HTN (HCC) - Sleep study pending.   4. CKD (chronic kidney disease), stage III - BMET today and repeat in 1 week.  5. Essential hypertension - The patient's blood pressure is controlled on her current regimen.  Continue current therapy.    6.  Hypokalemia - Increase K+ to 40 mEq in A and 20 mEq in P.  BMET today and repeat in 1 week.   Dispo:  Return in about 6 months (around 02/19/2017) for Routine Follow Up with Dr. Clifton James.   Medication Adjustments/Labs and Tests Ordered: Current medicines are reviewed at length with the patient today.  Concerns regarding medicines are outlined above.  Orders/Tests:  Orders Placed This Encounter  Procedures  . Basic Metabolic Panel (BMET)  . Basic metabolic panel   Medication changes: Meds ordered this encounter  Medications  . potassium chloride SA (K-DUR,KLOR-CON) 20 MEQ tablet    Sig: Take 1 tablet (20 mEq total) by mouth as directed. Take 2 tabs (40 meq) in the AM and 1 tab (20 meq) in the PM    Dispense:  135 tablet    Refill:  3   Signed, [name], PA-C  08/19/2016 10:01 AM    [facility] [address] [place], [place], [place]  [address] Phone: [phone]; Fax: [phone]

## 2016-08-19 NOTE — Telephone Encounter (Signed)
Thanks for trying to reach her. We can only try to get her in as soon as possible. Robin Snow

## 2016-08-19 NOTE — Patient Instructions (Addendum)
Medication Instructions:  1. INCREASE POTASSIUM TO 40 MEQ IN THE MORNING AND 20 MEQ IN THE PM  Labwork: 1. TODAY BMET  2. IN 1 WEEK REPEAT BMET DUE TO MEDICATION INCREASE IN POTASSIUM  Testing/Procedures: NONE ORDERED  Follow-Up: Your physician wants you to follow-up in: 6 MONTHS WITH DR. Clifton JamesMCALHANY You will receive a reminder letter in the mail two months in advance. If you don't receive a letter, please call our office to schedule the follow-up appointment.   Any Other Special Instructions Will Be Listed Below (If Applicable). CALL IF YOUR DIZZINESS GETS WORSE FOR AN EARLIER APPT PER [name], PAC     If you need a refill on your cardiac medications before your next appointment, please call your pharmacy.

## 2016-08-19 NOTE — Telephone Encounter (Signed)
Pt vm box is full. I lmom on DPR daughter's cell # to go over results.

## 2016-08-19 NOTE — Telephone Encounter (Signed)
-----   Message from Beatrice LecherScott T Weaver, [place]PA-C sent at 08/19/2016  3:51 PM EDT ----- Please call the patient. Kidney function is stable. The potassium is ok. Plan repeat BMET next week as recommended at OV today.  Continue with current treatment plan. Tereso NewcomerScott Weaver, PA-C   08/19/2016 3:51 PM

## 2016-08-20 ENCOUNTER — Ambulatory Visit (INDEPENDENT_AMBULATORY_CARE_PROVIDER_SITE_OTHER): Payer: Medicare HMO | Admitting: Internal Medicine

## 2016-08-20 ENCOUNTER — Encounter: Payer: Self-pay | Admitting: Internal Medicine

## 2016-08-20 VITALS — BP 158/74 | HR 60 | Ht 67.0 in | Wt 259.0 lb

## 2016-08-20 DIAGNOSIS — I495 Sick sinus syndrome: Secondary | ICD-10-CM | POA: Diagnosis not present

## 2016-08-20 MED ORDER — AMLODIPINE BESYLATE 5 MG PO TABS: 5.0000 mg | ORAL_TABLET | Freq: Every day | ORAL | 3 refills | Status: DC

## 2016-08-20 MED ORDER — METOPROLOL TARTRATE 25 MG PO TABS: 25.0000 mg | ORAL_TABLET | Freq: Two times a day (BID) | ORAL | 3 refills | Status: DC

## 2016-08-20 NOTE — Patient Instructions (Signed)
Medication Instructions: - Your physician has recommended you make the following change in your medication:  1) Decrease lopressor (metoprolol tartrate) to 25 mg- take one tablet by mouth twice daily 2) Start norvasc (amlodipine) 5 mg- take one tablet by mouth once daily  Labwork: - none ordered  Procedures/Testing: - none ordered  Follow-Up: - Your physician recommends that you schedule a follow-up appointment in: 3-4 weeks with Joice LoftsAmber, NP/ [place], PA for [name].   Any Additional Special Instructions Will Be Listed Below (If Applicable).     If you need a refill on your cardiac medications before your next appointment, please call your pharmacy.

## 2016-08-20 NOTE — Progress Notes (Signed)
ELECTROPHYSIOLOGY CONSULT NOTE  Patient ID: Buddy DutyBarbara P Gunther, MRN: [id number], DOB/AGE: 1940/10/09 76 y.o. Admit date: (Not on file) Date of Consult: 08/20/2016  Primary Physician: Myrlene Brokerrawford, Elizabeth A, MD Primary Cardiologist:CMAc   Buddy DutyBarbara P Rasco is being seen today for the evaluation of dizziness and bradycardia at the request of Dr Wonda Cheng McA   HPI Buddy DutyBarbara P Folta is a 76 y.o. female  Referred 2/2 bradycardia  She has a hx of paroxysmal atrial fibrillation with rapid ventricular response. Her ECGs were reviewed from 2015 and heart rates were in the 140s. She has been treated with significant rate control with metoprolol as well as Cardizem. Reviewing the old charts her metoprolol dose was increased in January. Having presented in atrial fibrillation with controlled rates it was done for hypertension.   She now has  complaints of dizziness dating back a couple of months. She describes these as brief without prodrome without residua. They lasted seconds.  She has not had recurrent tachypalpitations.  She has chronic shortness of breath and peripheral edema.      DATE TEST    5/01    cATH   EF  % Normal CA  1/12    MYOVIEW   EF 65 % NO ISCHEMIA  1/18 Echo EF 65-70 Severe BAE, mod MR   Holter personnally reviewed  Sinus with avg HR 48 ( 22-109) Significant 1 AVB PAC/PVC 6.9K/608   Social history he is hromboembolic risk factors ( age  -2, HTN-1, TIA/CVA-2, DM-1, Vasc disease -1, CHF-1, Gender-1) for a CHADSVASc Score of 8-9   Past Medical History:  Diagnosis Date  . AKI (acute kidney injury) (HCC) 04/2016  . Anemia   . Arthritis   . Chronic atrial fibrillation (HCC)   . Chronic edema   . Chronic venous insufficiency   . CKD (chronic kidney disease), stage III   . Coagulopathy (HCC)   . Diabetes mellitus    Type 2  . Diverticulosis   . Dyslipidemia   . GERD (gastroesophageal reflux disease)   . GI bleed 2015   a. lower GI from diverticular source 2015.  [name]. Hx  of transfusion of packed red blood cells   . Hypertension   . Morbid obesity (HCC)   . Pulmonary hypertension (HCC)   . Pyelonephritis 04/2016  . Renal infarct (HCC)    a. 04/2016- adm with flank pain, ? pyelo vs renal infarct in setting of recent subtherapeutic INR.  [name]. Sinus bradycardia   . Stroke [facility](HCC) 2015      Surgical History:  Past Surgical History:  Procedure Laterality Date  . CESAREAN SECTION    . COLONOSCOPY Left 01/24/2013   Procedure: COLONOSCOPY;  Surgeon: Willis ModenaWilliam Outlaw, MD;  [facility];  Service: Endoscopy;  Laterality: Left;  . ESOPHAGOGASTRODUODENOSCOPY Left 01/23/2013   Procedure: ESOPHAGOGASTRODUODENOSCOPY (EGD);  Surgeon: Willis ModenaWilliam Outlaw, MD;  [facility];  Service: Endoscopy;  Laterality: Left;  . EYE SURGERY Bilateral    cataract surgery  . JOINT REPLACEMENT    . REPLACEMENT TOTAL KNEE BILATERAL    . TUBAL LIGATION       Home Meds: Prior to Admission medications   Medication Sig Start Date End Date Taking? Authorizing Provider  diltiazem (CARDIZEM CD) 120 MG 24 hr capsule Take 1 capsule (120 mg total) by mouth daily. 05/11/16 08/19/16  Tereso NewcomerWeaver, Scott T, PA-C  ferrous sulfate 325 (65 FE) MG EC tablet TAKE ONE TABLET BY MOUTH TWICE DAILY 05/22/16   Hillard Dankerrawford, Elizabeth  A, MD  furosemide (LASIX) 80 MG tablet Take 1 tablet (80 mg total) by mouth 2 (two) times daily. 08/03/16   Dunn, Tacey Ruiz, PA-C  gabapentin (NEURONTIN) 100 MG capsule TAKE 1 CAPSULE EVERY NIGHT AT BEDTIME 10/15/15   [name], MD  gabapentin (NEURONTIN) 300 MG capsule TAKE 1 CAPSULE THREE TIMES DAILY 06/20/15   [name], MD  lovastatin (MEVACOR) 20 MG tablet TAKE 1 TABLET EVERY NIGHT AT BEDTIME 10/01/15   [name], MD  meclizine (ANTIVERT) 25 MG tablet Take 1 tablet (25 mg total) by mouth every 4 (four) hours as needed for dizziness. 04/04/16   Nelwyn Salisbury, MD  metolazone (ZAROXOLYN) 2.5 MG tablet Take 1 tablet 30 minutes before lasix on Tuesday  and Friday 08/03/16   Laurann Montana, PA-C  metoprolol (LOPRESSOR) 50 MG tablet Take 2 tablets (100 mg total) by mouth 2 (two) times daily. 04/17/16   [name], MD  oxybutynin (DITROPAN) 5 MG tablet TAKE 1 TABLET EVERY DAY 06/20/15   [name], MD  potassium chloride SA (K-DUR,KLOR-CON) 20 MEQ tablet Take 1 tablet (20 mEq total) by mouth as directed. Take 2 tabs (40 meq) in the AM and 1 tab (20 meq) in the PM 08/19/16   [name] T, PA-C  warfarin (COUMADIN) 5 MG tablet Take 1 tablet (5 mg total) by mouth daily. 03/05/16   [name], MD    Allergies:  Allergies  Allergen Reactions  . Aspirin Hives  . Hydrocodone Hives    Tolerates oxycodone and tramadol  . Rofecoxib Hives    Social History   Social History  . Marital status: Widowed    Spouse name: [name]  . Number of children: 4  . Years of education: 12   Occupational History  . Not on file.   Social History Main Topics  . Smoking status: Former Smoker    Types: Cigarettes    Quit date: 07/07/1993  . Smokeless tobacco: Never Used     Comment: .3 cig a day  . Alcohol use No     Comment: no longer drinks alcohol  . Drug use: No  . Sexual activity: Not on file   Other Topics Concern  . Not on file   Social History Narrative   Patient is widowed and has 4 living children, and 2 deceased children.   Patient is right handed.   Patient has hs education.   Patient drinks coffee and soda once a week.     Family History  Problem Relation Age of Onset  . Cancer Father   . Stroke Maternal Aunt      ROS:  Please see the history of present illness.     All other systems reviewed and negative.    Physical Exam:  Blood pressure (!) 158/74, pulse 60, height 5\' 7"  (1.702 m), weight 259 lb (117.5 kg). General: Well developed, Morbidly obese  female in no acute distress. Head: Normocephalic, atraumatic, sclera non-icteric, no xanthomas, nares are without discharge. EENT: normal  Lymph Nodes:   none Neck: Negative for carotid bruits. JVD 10 cm with positive P waves Back:without scoliosis kyphosis Lungs: Clear bilaterally to auscultation without wheezes, rales, or rhonchi. Breathing is unlabored. Heart: RRR with S1 S2. 2/6 systolic  murmur . No rubs, or gallops appreciated. Abdomen: Soft, non-tender, non-distended with normoactive bowel sounds. No hepatomegaly. No rebound/guarding. No obvious abdominal masses. Msk:  Strength and tone appear normal for age. Extremities: No clubbing or  cyanosis.  3+ brawny edema.  Distal pedal pulses are 2+ and equal bilaterally. Skin: Warm and Dry Neuro: Alert and oriented X 3. CN III-XII intact Grossly normal sensory and motor function . Psych:  Responds to questions appropriately with a normal affect.      Labs: Cardiac Enzymes No results for input(s): CKTOTAL, CKMB, TROPONINI in the last 72 hours. CBC Lab Results  Component Value Date   WBC 5.3 08/12/2016   HGB 11.0 (L) 07/24/2016   HCT 35.2 08/12/2016   MCV 91 08/12/2016   PLT 159 08/12/2016   PROTIME: No results for input(s): LABPROT, INR in the last 72 hours. Chemistry   Recent Labs Lab 08/19/16 1010  NA 139  K 3.8  CL 95*  CO2 25  BUN 25  CREATININE 1.57*  CALCIUM 9.0  GLUCOSE 103*   Lipids Lab Results  Component Value Date   CHOL 129 04/26/2015   HDL 33.70 (L) 04/26/2015   LDLCALC 69 04/26/2015   TRIG 129.0 04/26/2015   BNP Pro B Natriuretic peptide (BNP)  Date/Time Value Ref Range Status  07/24/2016 10:16 AM 87.0 0.0 - 100.0 pg/mL Final  07/20/2014 04:58 PM 162.0 (H) 0.0 - 100.0 pg/mL Final  11/13/2013 02:15 PM 683.9 (H) 0 - 125 pg/mL Final  05/01/2010 02:34 PM 40.0 0.0 - 100.0 pg/mL Final   NT-Pro BNP  Date/Time Value Ref Range Status  04/24/2016 04:11 PM 669 0 - 738 pg/mL Final    Comment:    The following cut-points have been suggested for the use of proBNP for the diagnostic evaluation of heart failure (HF) in patients with acute dyspnea: Modality                      Age           Optimal Cut                            (years)            Point ------------------------------------------------------ Diagnosis (rule in HF)        <50            450 pg/mL                           50 - 75            900 pg/mL                               >75           1800 pg/mL Exclusion (rule out HF)  Age independent     300 pg/mL    Thyroid Function Tests: No results for input(s): TSH, T4TOTAL, T3FREE, THYROIDAB in the last 72 hours.  Invalid input(s): FREET3 Miscellaneous Lab Results  Component Value Date   DDIMER 0.30 06/17/2012    Radiology/Studies:  No results found.  EKG:  Atrial fibrillation/flutter at a rate of 65  Second ECG following dizzy spell demonstrates sinus rhythm with first-degree AV block    Assessment and Plan:  Atrial fibrillation/flutter-paroxysmal  Sinus bradycardia/sinus node dysfunction  Dizziness likely secondary to the above  Hypertension-poorly controlled  History of stroke  Renal insufficiency    She has atrial fibrillation -paroxysmal but not permanent as noted in the medical record. She's having dizzy spells almost certainly related  to sinus node dysfunction and bradycardia. Her dizzy spell this morning was located by atrial fibrillation on the one hand and sinus rhythm on the other suggesting a post termination pause.  The temporal relationship of the spells with the increase in her metoprolol in January is striking. Notably, when she presented in January her atrial fibrillation rate was not rapid; hence, I think the first strategy would be to decrease her metoprolol. If she continues to have dizzy spells not withstanding, she will need a pacemaker.  We will decrease her metoprolol from 100 twice a day--25 twice a day and begin her on amlodipine 5 mg daily. She has previously been on lisinopril and the reasons for status continuation are not clear. She has modest renal insufficiency which makes an ARB  and spironolactone less attractive  Her blood pressure is poorly controlled will address As above  It would be my hope that she has no further dizzy spells. We will have her come back in the seen in 3-4 weeks if the dizzy spells are continuing at that juncture I think pacing B the next intervention. We will also need to check her blood pressure at that time hydralazine might be an appropriate  addition   Sherryl Manges

## 2016-08-24 ENCOUNTER — Institutional Professional Consult (permissible substitution): Payer: Medicare HMO | Admitting: Cardiology

## 2016-08-27 ENCOUNTER — Other Ambulatory Visit: Payer: Medicare HMO

## 2016-09-08 ENCOUNTER — Inpatient Hospital Stay (HOSPITAL_COMMUNITY)
Admission: EM | Admit: 2016-09-08 | Discharge: 2016-09-10 | DRG: 243 | Disposition: A | Discharge status: Home / Self Care | Payer: Medicare Other | Attending: Family Medicine | Admitting: Family Medicine

## 2016-09-08 ENCOUNTER — Inpatient Hospital Stay (HOSPITAL_COMMUNITY): Payer: Medicare Other

## 2016-09-08 ENCOUNTER — Encounter (HOSPITAL_COMMUNITY): Payer: Self-pay | Admitting: Emergency Medicine

## 2016-09-08 DIAGNOSIS — I6312 Cerebral infarction due to embolism of basilar artery: Secondary | ICD-10-CM | POA: Diagnosis present

## 2016-09-08 DIAGNOSIS — R791 Abnormal coagulation profile: Secondary | ICD-10-CM | POA: Diagnosis present

## 2016-09-08 DIAGNOSIS — R001 Bradycardia, unspecified: Secondary | ICD-10-CM | POA: Diagnosis present

## 2016-09-08 DIAGNOSIS — I10 Essential (primary) hypertension: Secondary | ICD-10-CM

## 2016-09-08 DIAGNOSIS — Z6841 Body Mass Index (BMI) 40.0 and over, adult: Secondary | ICD-10-CM

## 2016-09-08 DIAGNOSIS — I481 Persistent atrial fibrillation: Secondary | ICD-10-CM | POA: Diagnosis not present

## 2016-09-08 DIAGNOSIS — Z87891 Personal history of nicotine dependence: Secondary | ICD-10-CM | POA: Diagnosis not present

## 2016-09-08 DIAGNOSIS — I130 Hypertensive heart and chronic kidney disease with heart failure and stage 1 through stage 4 chronic kidney disease, or unspecified chronic kidney disease: Secondary | ICD-10-CM | POA: Diagnosis present

## 2016-09-08 DIAGNOSIS — Z96653 Presence of artificial knee joint, bilateral: Secondary | ICD-10-CM | POA: Diagnosis not present

## 2016-09-08 DIAGNOSIS — I455 Other specified heart block: Secondary | ICD-10-CM | POA: Diagnosis present

## 2016-09-08 DIAGNOSIS — E118 Type 2 diabetes mellitus with unspecified complications: Secondary | ICD-10-CM | POA: Diagnosis not present

## 2016-09-08 DIAGNOSIS — Z950 Presence of cardiac pacemaker: Secondary | ICD-10-CM

## 2016-09-08 DIAGNOSIS — B9620 Unspecified Escherichia coli [E. coli] as the cause of diseases classified elsewhere: Secondary | ICD-10-CM | POA: Diagnosis present

## 2016-09-08 DIAGNOSIS — Z8673 Personal history of transient ischemic attack (TIA), and cerebral infarction without residual deficits: Secondary | ICD-10-CM | POA: Diagnosis not present

## 2016-09-08 DIAGNOSIS — I495 Sick sinus syndrome: Principal | ICD-10-CM | POA: Diagnosis present

## 2016-09-08 DIAGNOSIS — N183 Chronic kidney disease, stage 3 unspecified: Secondary | ICD-10-CM | POA: Diagnosis present

## 2016-09-08 DIAGNOSIS — N179 Acute kidney failure, unspecified: Secondary | ICD-10-CM | POA: Diagnosis present

## 2016-09-08 DIAGNOSIS — N189 Chronic kidney disease, unspecified: Secondary | ICD-10-CM

## 2016-09-08 DIAGNOSIS — R42 Dizziness and giddiness: Secondary | ICD-10-CM

## 2016-09-08 DIAGNOSIS — I272 Pulmonary hypertension, unspecified: Secondary | ICD-10-CM | POA: Diagnosis present

## 2016-09-08 DIAGNOSIS — I4892 Unspecified atrial flutter: Secondary | ICD-10-CM | POA: Diagnosis present

## 2016-09-08 DIAGNOSIS — I509 Heart failure, unspecified: Secondary | ICD-10-CM | POA: Diagnosis not present

## 2016-09-08 DIAGNOSIS — Z79899 Other long term (current) drug therapy: Secondary | ICD-10-CM

## 2016-09-08 DIAGNOSIS — K219 Gastro-esophageal reflux disease without esophagitis: Secondary | ICD-10-CM | POA: Diagnosis present

## 2016-09-08 DIAGNOSIS — I4891 Unspecified atrial fibrillation: Secondary | ICD-10-CM | POA: Diagnosis not present

## 2016-09-08 DIAGNOSIS — I5032 Chronic diastolic (congestive) heart failure: Secondary | ICD-10-CM | POA: Diagnosis not present

## 2016-09-08 DIAGNOSIS — E785 Hyperlipidemia, unspecified: Secondary | ICD-10-CM | POA: Diagnosis present

## 2016-09-08 DIAGNOSIS — Z7901 Long term (current) use of anticoagulants: Secondary | ICD-10-CM

## 2016-09-08 DIAGNOSIS — N390 Urinary tract infection, site not specified: Secondary | ICD-10-CM | POA: Diagnosis not present

## 2016-09-08 DIAGNOSIS — I951 Orthostatic hypotension: Secondary | ICD-10-CM | POA: Diagnosis present

## 2016-09-08 DIAGNOSIS — I482 Chronic atrial fibrillation: Secondary | ICD-10-CM | POA: Diagnosis not present

## 2016-09-08 DIAGNOSIS — I442 Atrioventricular block, complete: Secondary | ICD-10-CM | POA: Diagnosis present

## 2016-09-08 DIAGNOSIS — I4821 Permanent atrial fibrillation: Secondary | ICD-10-CM | POA: Diagnosis present

## 2016-09-08 LAB — URINALYSIS, ROUTINE W REFLEX MICROSCOPIC
Bilirubin Urine: NEGATIVE
Glucose, UA: NEGATIVE mg/dL
Hgb urine dipstick: NEGATIVE
KETONES UR: NEGATIVE mg/dL
Nitrite: NEGATIVE
PROTEIN: NEGATIVE mg/dL
Specific Gravity, Urine: 1.009 (ref 1.005–1.030)
pH: 5 (ref 5.0–8.0)

## 2016-09-08 LAB — BASIC METABOLIC PANEL
ANION GAP: 10 (ref 5–15)
BUN: 20 mg/dL (ref 6–20)
CHLORIDE: 101 mmol/L (ref 101–111)
CO2: 27 mmol/L (ref 22–32)
Calcium: 8.9 mg/dL (ref 8.9–10.3)
Creatinine, Ser: 1.64 mg/dL — ABNORMAL HIGH (ref 0.44–1.00)
GFR calc non Af Amer: 29 mL/min — ABNORMAL LOW (ref >60)
GFR, EST AFRICAN AMERICAN: 34 mL/min — AB (ref >60)
GLUCOSE: 114 mg/dL — AB (ref 65–99)
POTASSIUM: 3.5 mmol/L (ref 3.5–5.1)
Sodium: 138 mmol/L (ref 135–145)

## 2016-09-08 LAB — CBC WITH DIFFERENTIAL/PLATELET
Basophils Absolute: 0 10*3/uL (ref 0.0–0.1)
Basophils Relative: 0 %
EOS PCT: 2 %
Eosinophils Absolute: 0.1 10*3/uL (ref 0.0–0.7)
HCT: 31.8 % — ABNORMAL LOW (ref 36.0–46.0)
HEMOGLOBIN: 10.6 g/dL — AB (ref 12.0–15.0)
LYMPHS ABS: 2.7 10*3/uL (ref 0.7–4.0)
Lymphocytes Relative: 43 %
MCH: 29.2 pg (ref 26.0–34.0)
MCHC: 33.3 g/dL (ref 30.0–36.0)
MCV: 87.6 fL (ref 78.0–100.0)
MONO ABS: 0.7 10*3/uL (ref 0.1–1.0)
MONOS PCT: 11 %
NEUTROS PCT: 44 %
Neutro Abs: 2.7 10*3/uL (ref 1.7–7.7)
Platelets: 166 10*3/uL (ref 150–400)
RBC: 3.63 MIL/uL — ABNORMAL LOW (ref 3.87–5.11)
RDW: 14.7 % (ref 11.5–15.5)
WBC: 6.3 10*3/uL (ref 4.0–10.5)

## 2016-09-08 LAB — COMPREHENSIVE METABOLIC PANEL
ALK PHOS: 94 U/L (ref 38–126)
ALT: 12 U/L — ABNORMAL LOW (ref 14–54)
ANION GAP: 10 (ref 5–15)
AST: 20 U/L (ref 15–41)
Albumin: 3.9 g/dL (ref 3.5–5.0)
BILIRUBIN TOTAL: 0.8 mg/dL (ref 0.3–1.2)
BUN: 24 mg/dL — ABNORMAL HIGH (ref 6–20)
CALCIUM: 9.1 mg/dL (ref 8.9–10.3)
CO2: 26 mmol/L (ref 22–32)
Chloride: 101 mmol/L (ref 101–111)
Creatinine, Ser: 1.87 mg/dL — ABNORMAL HIGH (ref 0.44–1.00)
GFR calc non Af Amer: 25 mL/min — ABNORMAL LOW (ref >60)
GFR, EST AFRICAN AMERICAN: 29 mL/min — AB (ref >60)
Glucose, Bld: 109 mg/dL — ABNORMAL HIGH (ref 65–99)
POTASSIUM: 3.7 mmol/L (ref 3.5–5.1)
SODIUM: 137 mmol/L (ref 135–145)
TOTAL PROTEIN: 8.5 g/dL — AB (ref 6.5–8.1)

## 2016-09-08 LAB — TROPONIN I: Troponin I: <0.03 ng/mL (ref <0.03)

## 2016-09-08 LAB — PROTIME-INR
INR: 3.93
PROTHROMBIN TIME: 39.4 s — AB (ref 11.4–15.2)

## 2016-09-08 LAB — I-STAT CG4 LACTIC ACID, ED: LACTIC ACID, VENOUS: 0.92 mmol/L (ref 0.5–1.9)

## 2016-09-08 LAB — TSH: TSH: 3.005 u[IU]/mL (ref 0.350–4.500)

## 2016-09-08 LAB — MAGNESIUM: Magnesium: 1.8 mg/dL (ref 1.7–2.4)

## 2016-09-08 LAB — MRSA PCR SCREENING: MRSA BY PCR: NEGATIVE

## 2016-09-08 MED ORDER — VITAMIN K1 10 MG/ML IJ SOLN
1.0000 mg | Freq: Once | INTRAMUSCULAR | Status: AC
  Administered 2016-09-08: 1 mg via INTRAVENOUS
  Filled 2016-09-08: qty 0.1

## 2016-09-08 MED ORDER — PANTOPRAZOLE SODIUM 40 MG PO TBEC
40.0000 mg | DELAYED_RELEASE_TABLET | Freq: Every day | ORAL | Status: DC
  Administered 2016-09-08 – 2016-09-10 (×3): 40 mg via ORAL
  Filled 2016-09-08 (×3): qty 1

## 2016-09-08 MED ORDER — CHLORHEXIDINE GLUCONATE 4 % EX LIQD
60.0000 mL | Freq: Once | CUTANEOUS | Status: AC
  Administered 2016-09-08: 4 via TOPICAL
  Filled 2016-09-08: qty 15

## 2016-09-08 MED ORDER — FERROUS SULFATE 325 (65 FE) MG PO TABS
325.0000 mg | ORAL_TABLET | Freq: Two times a day (BID) | ORAL | Status: DC
Start: 2016-09-08 — End: 2016-09-10
  Administered 2016-09-08 – 2016-09-10 (×5): 325 mg via ORAL
  Filled 2016-09-08 (×5): qty 1

## 2016-09-08 MED ORDER — ATROPINE SULFATE 1 MG/10ML IJ SOSY
1.0000 mg | PREFILLED_SYRINGE | INTRAMUSCULAR | Status: DC | PRN
  Administered 2016-09-08: 0.5 mg via INTRAVENOUS
  Filled 2016-09-08: qty 10

## 2016-09-08 MED ORDER — METOLAZONE 2.5 MG PO TABS
2.5000 mg | ORAL_TABLET | ORAL | Status: DC
  Administered 2016-09-08: 2.5 mg via ORAL
  Filled 2016-09-08: qty 1

## 2016-09-08 MED ORDER — GABAPENTIN 100 MG PO CAPS
100.0000 mg | ORAL_CAPSULE | Freq: Every day | ORAL | Status: DC
  Administered 2016-09-08 – 2016-09-09 (×2): 100 mg via ORAL
  Filled 2016-09-08 (×2): qty 1

## 2016-09-08 MED ORDER — AMLODIPINE BESYLATE 5 MG PO TABS
5.0000 mg | ORAL_TABLET | Freq: Every day | ORAL | Status: DC
  Administered 2016-09-08 – 2016-09-09 (×2): 5 mg via ORAL
  Filled 2016-09-08 (×3): qty 1

## 2016-09-08 MED ORDER — SODIUM CHLORIDE 0.9 % IV SOLN: INTRAVENOUS | Status: DC

## 2016-09-08 MED ORDER — ONDANSETRON HCL 4 MG PO TABS: 4.0000 mg | ORAL_TABLET | Freq: Four times a day (QID) | ORAL | Status: DC | PRN

## 2016-09-08 MED ORDER — SODIUM CHLORIDE 0.9 % IR SOLN
80.0000 mg | Status: AC
  Administered 2016-09-09: 80 mg
  Filled 2016-09-08: qty 2

## 2016-09-08 MED ORDER — IPRATROPIUM-ALBUTEROL 0.5-2.5 (3) MG/3ML IN SOLN: 3.0000 mL | Freq: Four times a day (QID) | RESPIRATORY_TRACT | Status: DC | PRN

## 2016-09-08 MED ORDER — OXYBUTYNIN CHLORIDE 5 MG PO TABS
5.0000 mg | ORAL_TABLET | Freq: Every day | ORAL | Status: DC
  Administered 2016-09-08 – 2016-09-10 (×3): 5 mg via ORAL
  Filled 2016-09-08 (×3): qty 1

## 2016-09-08 MED ORDER — DEXTROSE 5 % IV SOLN
1.0000 g | Freq: Once | INTRAVENOUS | Status: AC
  Administered 2016-09-08: 1 g via INTRAVENOUS
  Filled 2016-09-08: qty 10

## 2016-09-08 MED ORDER — GABAPENTIN 300 MG PO CAPS
300.0000 mg | ORAL_CAPSULE | Freq: Three times a day (TID) | ORAL | Status: DC
  Administered 2016-09-08 – 2016-09-10 (×6): 300 mg via ORAL
  Filled 2016-09-08 (×7): qty 1

## 2016-09-08 MED ORDER — SODIUM CHLORIDE 0.9 % IV SOLN
INTRAVENOUS | Status: DC
  Administered 2016-09-09: via INTRAVENOUS

## 2016-09-08 MED ORDER — DEXTROSE 5 % IV SOLN
1.0000 g | INTRAVENOUS | Status: DC
  Administered 2016-09-09 – 2016-09-10 (×2): 1 g via INTRAVENOUS
  Filled 2016-09-08 (×2): qty 10

## 2016-09-08 MED ORDER — SODIUM CHLORIDE 0.9 % IV BOLUS (SEPSIS)
1000.0000 mL | Freq: Once | INTRAVENOUS | Status: AC
  Administered 2016-09-08: 1000 mL via INTRAVENOUS

## 2016-09-08 MED ORDER — METOPROLOL TARTRATE 25 MG PO TABS: 25.0000 mg | ORAL_TABLET | Freq: Two times a day (BID) | ORAL | Status: DC

## 2016-09-08 MED ORDER — ONDANSETRON HCL 4 MG/2ML IJ SOLN: 4.0000 mg | Freq: Four times a day (QID) | INTRAMUSCULAR | Status: DC | PRN

## 2016-09-08 MED ORDER — CHLORHEXIDINE GLUCONATE 4 % EX LIQD
60.0000 mL | Freq: Once | CUTANEOUS | Status: AC
  Administered 2016-09-09: 4 via TOPICAL
  Filled 2016-09-08: qty 15

## 2016-09-08 MED ORDER — CEFAZOLIN SODIUM-DEXTROSE 2-4 GM/100ML-% IV SOLN
2.0000 g | INTRAVENOUS | Status: AC
  Administered 2016-09-09: 2 g via INTRAVENOUS
  Filled 2016-09-08 (×2): qty 100

## 2016-09-08 MED ORDER — PRAVASTATIN SODIUM 40 MG PO TABS
20.0000 mg | ORAL_TABLET | Freq: Every day | ORAL | Status: DC
  Administered 2016-09-08 – 2016-09-09 (×2): 20 mg via ORAL
  Filled 2016-09-08 (×2): qty 1

## 2016-09-08 MED ORDER — DILTIAZEM HCL ER COATED BEADS 120 MG PO CP24: 120.0000 mg | ORAL_CAPSULE | Freq: Every day | ORAL | Status: DC

## 2016-09-08 NOTE — Progress Notes (Signed)
Pt having frequent pauses. Cards made aware. No new orders.

## 2016-09-08 NOTE — H&P (Signed)
History and Physical    Robin Snow:[id number] DOB: Aug 05, 1940 DOA: 09/08/2016  Referring MD/NP/PA: Fayrene Helper, PA-C PCP: [name], MD  Patient coming from: Home  Chief Complaint: Dizziness  HPI: Robin Snow is a 76 y.o. female with medical history significant of A. fib on chronic anticoagulation, HTN, HLD, CVA, and GERD; who presents with complaints of dizziness. Patient reports intermittent episodes of feeling lightheaded/dizzy over the last 1 month. Symptoms will last several minutes and then self resolve. Symptoms usually occur with movement and have become more frequent in the last 24 hours for which patient became more concerned. She feels as though she would pass out, but denies any loss of consciousness, falls, chest pain, or worsening shortness of breath. Associated symptoms included lower extremity swelling, increased urinary frequency beyond baseline, and weight loss of approximately 10 pounds for 1 week. She was evaluated by Dr. Graciela Husbands of cardiology approximately 2 -1/2 weeks ago, and there was concern for possible tachybradycardia syndrome for which she was decreased on her metoprolol and started on amlodipine. Since that time patient notes no change in symptoms. Denies having any significant fevers, chills, abdominal pain, diarrhea, or constipation.  ED Course: Upon admission into the emergency department patient afebrile, heart rates 49-117, and all other vital signs relatively within normal limits. Labs revealed hemoglobin 10.6, BUN 24, creatinine 1.87, INR 3.93. Urinalysis positive for signs of infection. Patient was started on Rocephin. While checking orthostatic vital signs patient's pulse rate was noted to have significant increase 20 bpm from laying to sitting.  Review of Systems: As per HPI otherwise 10 point review of systems negative.   Past Medical History:  Diagnosis Date  . AKI (acute kidney injury) (HCC) 04/2016  . Anemia   . Arthritis   .  Chronic atrial fibrillation (HCC)   . Chronic edema   . Chronic venous insufficiency   . CKD (chronic kidney disease), stage III   . Coagulopathy (HCC)   . Diabetes mellitus    Type 2  . Diverticulosis   . Dyslipidemia   . GERD (gastroesophageal reflux disease)   . GI bleed 2015   a. lower GI from diverticular source 2015.  [name] Hx of transfusion of packed red blood cells   . Hypertension   . Morbid obesity (HCC)   . Pulmonary hypertension (HCC)   . Pyelonephritis 04/2016  . Renal infarct (HCC)    a. 04/2016- adm with flank pain, ? pyelo vs renal infarct in setting of recent subtherapeutic INR.  [name] Sinus bradycardia   . Stroke [facility]) 2015    Past Surgical History:  Procedure Laterality Date  . CESAREAN SECTION    . COLONOSCOPY Left 01/24/2013   Procedure: COLONOSCOPY;  Surgeon: Willis Modena, MD;  Location[facility] ENDOSCOPY;  Service: Endoscopy;  Laterality: Left;  . ESOPHAGOGASTRODUODENOSCOPY Left 01/23/2013   Procedure: ESOPHAGOGASTRODUODENOSCOPY (EGD);  Surgeon: Willis Modena, MD;  Location: [facility] ENDOSCOPY;  Service: Endoscopy;  Laterality: Left;  . EYE SURGERY Bilateral    cataract surgery  . JOINT REPLACEMENT    . REPLACEMENT TOTAL KNEE BILATERAL    . TUBAL LIGATION       reports that she quit smoking about 23 years ago. Her smoking use included Cigarettes. She has never used smokeless tobacco. She reports that she does not drink alcohol or use drugs.  Allergies  Allergen Reactions  . Aspirin Hives  . Hydrocodone Hives    Tolerates oxycodone and tramadol  . Rofecoxib Hives    Family  History  Problem Relation Age of Onset  . Cancer Father   . Stroke Maternal Aunt     Prior to Admission medications   Medication Sig Start Date End Date Taking? Authorizing Provider  amLODipine (NORVASC) 5 MG tablet Take 1 tablet (5 mg total) by mouth daily. 08/20/16 11/18/16 Yes Duke SalviaKlein, Steven C, MD  diltiazem (CARDIZEM CD) 120 MG 24 hr capsule Take 1 capsule (120 mg total) by mouth daily.  05/11/16 09/08/16 Yes Weaver, Scott T, PA-C  ferrous sulfate 325 (65 FE) MG EC tablet TAKE ONE TABLET BY MOUTH TWICE DAILY 05/22/16  Yes Myrlene Brokerrawford, Elizabeth A, MD  furosemide (LASIX) 80 MG tablet Take 1 tablet (80 mg total) by mouth 2 (two) times daily. 08/03/16  Yes Dunn, Dayna N, PA-C  gabapentin (NEURONTIN) 100 MG capsule TAKE 1 CAPSULE EVERY NIGHT AT BEDTIME 10/15/15  Yes Myrlene Brokerrawford, Elizabeth A, MD  gabapentin (NEURONTIN) 300 MG capsule TAKE 1 CAPSULE THREE TIMES DAILY 06/20/15  Yes Myrlene Brokerrawford, Elizabeth A, MD  lovastatin (MEVACOR) 20 MG tablet TAKE 1 TABLET EVERY NIGHT AT BEDTIME 10/01/15  Yes Myrlene Brokerrawford, Elizabeth A, MD  metolazone (ZAROXOLYN) 2.5 MG tablet Take 1 tablet 30 minutes before lasix on Tuesday and Friday 08/03/16  Yes Dunn, Dayna N, PA-C  metoprolol tartrate (LOPRESSOR) 25 MG tablet Take 1 tablet (25 mg total) by mouth 2 (two) times daily. 08/20/16 11/18/16 Yes Duke SalviaKlein, Steven C, MD  oxybutynin (DITROPAN) 5 MG tablet TAKE 1 TABLET EVERY DAY 06/20/15  Yes Myrlene Brokerrawford, Elizabeth A, MD  potassium chloride SA (K-DUR,KLOR-CON) 20 MEQ tablet Take 1 tablet (20 mEq total) by mouth as directed. Take 2 tabs (40 meq) in the AM and 1 tab (20 meq) in the PM 08/19/16  Yes Weaver, Lorin PicketScott T, PA-C  warfarin (COUMADIN) 5 MG tablet Take 1 tablet (5 mg total) by mouth daily. 03/05/16  Yes Myrlene Brokerrawford, Elizabeth A, MD  meclizine (ANTIVERT) 25 MG tablet Take 1 tablet (25 mg total) by mouth every 4 (four) hours as needed for dizziness. Patient not taking: Reported on 09/08/2016 04/04/16   Nelwyn SalisburyFry, Stephen A, MD    Physical Exam:  Constitutional: Elderly female in no acute distress at this time. Vitals:   09/08/16 0152 09/08/16 0200 09/08/16 0300 09/08/16 0330  BP:  (!) 160/87 128/66 (!) 151/99  Pulse:  (!) 117 (!) 49 94  Resp:  18    Temp:  99 F (37.2 C)    TempSrc:  Oral    SpO2:  97% 99% 100%  Weight: 68.9 kg (152 lb)     Height: 5\' 7"  (1.702 m)      Eyes: PERRL, lids and conjunctivae normal ENMT: Mucous membranes are  moist. Posterior pharynx clear of any exudate or lesions.   Neck: normal, supple, no masses, no thyromegaly Respiratory: clear to auscultation bilaterally, no wheezing, no crackles. Normal respiratory effort. No accessory muscle use.  Cardiovascular: Irregular irregular, no murmurs / rubs / gallops. +2 pitting lower extremity edema. 2+ pedal pulses. No carotid bruits.  Abdomen: no tenderness, no masses palpated. No hepatosplenomegaly. Bowel sounds positive.  Musculoskeletal: no clubbing / cyanosis. No joint deformity upper and lower extremities. Good ROM, no contractures. Normal muscle tone.  Skin: no rashes, lesions, ulcers. No induration Neurologic: CN 2-12 grossly intact. Sensation abnormal on the right side, DTR normal. Strength 5/5 in all 4.  Psychiatric: Normal judgment and insight. Alert and oriented x 3. Normal mood.     Labs on Admission: I have personally reviewed following labs and imaging studies  CBC:  Recent Labs Lab 09/08/16 0201  WBC 6.3  NEUTROABS 2.7  HGB 10.6*  HCT 31.8*  MCV 87.6  PLT 166   Basic Metabolic Panel:  Recent Labs Lab 09/08/16 0201  NA 137  K 3.7  CL 101  CO2 26  GLUCOSE 109*  BUN 24*  CREATININE 1.87*  CALCIUM 9.1   GFR: Estimated Creatinine Clearance: 24.5 mL/min (A) (by C-G formula based on SCr of 1.87 mg/dL (H)). Liver Function Tests:  Recent Labs Lab 09/08/16 0201  AST 20  ALT 12*  ALKPHOS 94  BILITOT 0.8  PROT 8.5*  ALBUMIN 3.9   No results for input(s): LIPASE, AMYLASE in the last 168 hours. No results for input(s): AMMONIA in the last 168 hours. Coagulation Profile:  Recent Labs Lab 09/08/16 0316  INR 3.93   Cardiac Enzymes:  Recent Labs Lab 09/08/16 0201  TROPONINI <0.03   BNP (last 3 results)  Recent Labs  04/24/16 1611 07/24/16 1016  PROBNP 669 87.0   HbA1C: No results for input(s): HGBA1C in the last 72 hours. CBG: No results for input(s): GLUCAP in the last 168 hours. Lipid Profile: No  results for input(s): CHOL, HDL, LDLCALC, TRIG, CHOLHDL, LDLDIRECT in the last 72 hours. Thyroid Function Tests: No results for input(s): TSH, T4TOTAL, FREET4, T3FREE, THYROIDAB in the last 72 hours. Anemia Panel: No results for input(s): VITAMINB12, FOLATE, FERRITIN, TIBC, IRON, RETICCTPCT in the last 72 hours. Urine analysis:    Component Value Date/Time   COLORURINE STRAW (A) 09/08/2016 0355   APPEARANCEUR CLEAR 09/08/2016 0355   LABSPEC 1.009 09/08/2016 0355   PHURINE 5.0 09/08/2016 0355   GLUCOSEU NEGATIVE 09/08/2016 0355   HGBUR NEGATIVE 09/08/2016 0355   BILIRUBINUR NEGATIVE 09/08/2016 0355   KETONESUR NEGATIVE 09/08/2016 0355   PROTEINUR NEGATIVE 09/08/2016 0355   UROBILINOGEN 1.0 11/29/2013 0520   NITRITE NEGATIVE 09/08/2016 0355   LEUKOCYTESUR MODERATE (A) 09/08/2016 0355   Sepsis Labs: No results found for this or any previous visit (from the past 240 hour(s)).   Radiological Exams on Admission: No results found.  EKG: Independently reviewed.Sinus tachycardia  Assessment/Plan Dizziness: Acute. Patient reports intermittent episodes of feeling lightheaded as though she could pass out that have increased in frequency over the last month. Recently evaluated by cardiology for possibility of a tachybradycardia syndrome for which she had been decreased on her metoprolol and started on amlodipine. - Admit to telemetry bed  - Recheck orthostatic vitals  - Follow up telemetry - consider need of consult cardiology in a.m.  Urinary tract infection: Acute. UA positive for signs of infection. - Follow-up urine culture  - Continue empiric abx of Rocephin    Acute kidney injury on chronic kidney disease stage III: Baseline creatinine previously 1.57, but patient presents with a creatinine 1.87 and BUN 24.  - Continue IV fluids normal saline at 75 mL per hour  - Hold nephrotoxic agents  Diastolic CHF: Last EF noted to be 65-70% in 04/2016.   Patient does not appear to be acutely  fluid overloaded at this time. - Restart and/or adjust Lasix dosage when medically appropriate  Essential hypertension - Continue metoprolol and amlodipine  Atrial fibrillation with supratherapeutic INR: Initial INR noted to be 3.3 on admission - Hold Coumadin - Pharmacy to dose Coumadin  - Continue Cardizem     DVT prophylaxis: On  Coumadin  Code Status: Full  Family Communication: No family present at bedside Disposition Plan: likely discharge home once medically stable  Consults called: none  Admission status: observation  Clydie Braun MD [facility] Pager [phone]  If 7PM-7AM, please contact night-coverage [email or web address] Password [email or web address]  09/08/2016, 5:48 AM

## 2016-09-08 NOTE — ED Notes (Signed)
Pacing pads placed on pt. Zoll at bedside.

## 2016-09-08 NOTE — ED Provider Notes (Signed)
[facility]-EMERGENCY DEPT Provider Note   CSN: [id number] Arrival date & time: 09/08/16  0147     History   Chief Complaint Chief Complaint  Patient presents with  . Dizziness    HPI Robin Snow is a 76 y.o. female.  HPI   76 year old obese female with history of prior stroke, chronic atrial fibrillation currently on warfarin, anemia, chronic kidney disease brought here for evaluation of dizziness. Patient report for the past month she has had episodes of dizziness in which she described as a lightheadedness sensation. States that it can happen spontaneously at rest and some time with exertion. She describes sensation of wooziness, feeling like she's going to pass out, and states it would last for several minutes. She denies any associated pain with these episodes. Specifically no fever, headache, confusion, diplopia, change in speech, neck pain, chest pain, shortness of breath, abdominal pain, back pain, focal numbness or weakness. She has been seen and evaluated by her cardiologist for this approximately 2 weeks ago according to patient. States that she had a heart monitoring for 2 days but no definitive diagnosis were made. Patient was told to come to the hospital if she explains symptoms again. She has had these episodes on a daily basis, and decided to come in today. She denies any loss of consciousness. She has been compliant with her medication. States that she is eating and drinking fine. She normally does ambulate with her cane and states that her gait has been the same.  Past Medical History:  Diagnosis Date  . AKI (acute kidney injury) (HCC) 04/2016  . Anemia   . Arthritis   . Chronic atrial fibrillation (HCC)   . Chronic edema   . Chronic venous insufficiency   . CKD (chronic kidney disease), stage III   . Coagulopathy (HCC)   . Diabetes mellitus    Type 2  . Diverticulosis   . Dyslipidemia   . GERD (gastroesophageal reflux disease)   . GI bleed 2015   a. lower GI from  diverticular source 2015.  [name] Hx of transfusion of packed red blood cells   . Hypertension   . Morbid obesity (HCC)   . Pulmonary hypertension (HCC)   . Pyelonephritis 04/2016  . Renal infarct (HCC)    a. 04/2016- adm with flank pain, ? pyelo vs renal infarct in setting of recent subtherapeutic INR.  [name] Sinus bradycardia   . Stroke [facility]) 2015    Patient Active Problem List   Diagnosis Date Noted  . Sinus bradycardia 07/08/2016  . CKD (chronic kidney disease) stage 3, GFR 30-59 ml/min 05/11/2016  . Anemia 05/01/2016  . Diabetes mellitus with complication (HCC)   . Renal infarction (HCC)   . Cerebrovascular accident (CVA) due to embolism of basilar artery (HCC) 03/22/2015  . Physical deconditioning 12/14/2014  . Status post insertion of spinal cord stimulator 12/14/2014  . Essential hypertension 05/18/2014  . HLD (hyperlipidemia) 05/18/2014  . Chronic venous insufficiency 05/11/2014  . Embolic stroke (HCC) 01/10/2014  . Right hemiparesis (HCC) 01/10/2014  . GERD (gastroesophageal reflux disease)   . Chronic pain syndrome 09/07/2013  . Painful total knee replacement (HCC) 08/01/2013  . Permanent atrial fibrillation (HCC) 05/22/2010    Past Surgical History:  Procedure Laterality Date  . CESAREAN SECTION    . COLONOSCOPY Left 01/24/2013   Procedure: COLONOSCOPY;  Surgeon: Willis Modena, MD;  Location: [facility] ENDOSCOPY;  Service: Endoscopy;  Laterality: Left;  . ESOPHAGOGASTRODUODENOSCOPY Left 01/23/2013   Procedure: ESOPHAGOGASTRODUODENOSCOPY (EGD);  Surgeon:  Willis Modena, MD;  Location: [facility] ENDOSCOPY;  Service: Endoscopy;  Laterality: Left;  . EYE SURGERY Bilateral    cataract surgery  . JOINT REPLACEMENT    . REPLACEMENT TOTAL KNEE BILATERAL    . TUBAL LIGATION      OB History    No data available       Home Medications    Prior to Admission medications   Medication Sig Start Date End Date Taking? Authorizing Provider  amLODipine (NORVASC) 5 MG tablet Take 1 tablet  (5 mg total) by mouth daily. 08/20/16 11/18/16  Duke Salvia, MD  diltiazem (CARDIZEM CD) 120 MG 24 hr capsule Take 1 capsule (120 mg total) by mouth daily. 05/11/16 08/20/16  [name] T, PA-C  ferrous sulfate 325 (65 FE) MG EC tablet TAKE ONE TABLET BY MOUTH TWICE DAILY 05/22/16   [name], MD  furosemide (LASIX) 80 MG tablet Take 1 tablet (80 mg total) by mouth 2 (two) times daily. 08/03/16   Dunn, Tacey Ruiz, PA-C  gabapentin (NEURONTIN) 100 MG capsule TAKE 1 CAPSULE EVERY NIGHT AT BEDTIME 10/15/15   [name], MD  gabapentin (NEURONTIN) 300 MG capsule TAKE 1 CAPSULE THREE TIMES DAILY 06/20/15   [name], MD  lovastatin (MEVACOR) 20 MG tablet TAKE 1 TABLET EVERY NIGHT AT BEDTIME 10/01/15   [name], MD  meclizine (ANTIVERT) 25 MG tablet Take 1 tablet (25 mg total) by mouth every 4 (four) hours as needed for dizziness. 04/04/16   Nelwyn Salisbury, MD  metolazone (ZAROXOLYN) 2.5 MG tablet Take 1 tablet 30 minutes before lasix on Tuesday and Friday 08/03/16   Laurann Montana, PA-C  metoprolol tartrate (LOPRESSOR) 25 MG tablet Take 1 tablet (25 mg total) by mouth 2 (two) times daily. 08/20/16 11/18/16  Duke Salvia, MD  oxybutynin (DITROPAN) 5 MG tablet TAKE 1 TABLET EVERY DAY 06/20/15   [name], MD  potassium chloride SA (K-DUR,KLOR-CON) 20 MEQ tablet Take 1 tablet (20 mEq total) by mouth as directed. Take 2 tabs (40 meq) in the AM and 1 tab (20 meq) in the PM 08/19/16   [name] T, PA-C  warfarin (COUMADIN) 5 MG tablet Take 1 tablet (5 mg total) by mouth daily. 03/05/16   [name], MD    Family History Family History  Problem Relation Age of Onset  . Cancer Father   . Stroke Maternal Aunt     Social History Social History  Substance Use Topics  . Smoking status: Former Smoker    Types: Cigarettes    Quit date: 07/07/1993  . Smokeless tobacco: Never Used     Comment: .3 cig a day  . Alcohol use No     Comment: no  longer drinks alcohol     Allergies   Aspirin; Hydrocodone; and Rofecoxib   Review of Systems Review of Systems  All other systems reviewed and are negative.    Physical Exam Updated Vital Signs BP (!) 160/87 (BP Location: Left Arm)   Pulse (!) 117   Temp 99 F (37.2 C) (Oral)   Resp 18   Ht 5\' 7"  (1.702 m)   Wt 68.9 kg (152 lb)   SpO2 97%   BMI 23.81 kg/m   Physical Exam  Constitutional: She is oriented to person, place, and time. She appears well-developed and well-nourished. No distress.  Obese female resting in bed in no acute discomfort.  HENT:  Head: Atraumatic.  Right Ear: External ear  normal.  Left Ear: External ear normal.  Eyes: Conjunctivae and EOM are normal. Pupils are equal, round, and reactive to light.  Neck: Normal range of motion. Neck supple.  No nuchal rigidity  Cardiovascular:  Irregularly irregular heart rhythm without murmurs, rubs or gallops  Pulmonary/Chest: Effort normal and breath sounds normal.  Abdominal: Soft. Bowel sounds are normal. She exhibits no distension. There is no tenderness.  Musculoskeletal: She exhibits edema (1+ pitting edema noted to bilateral lower extremities with intact distal pulses. ).  Neurological: She is alert and oriented to person, place, and time. She has normal strength. No cranial nerve deficit or sensory deficit. GCS eye subscore is 4. GCS verbal subscore is 5. GCS motor subscore is 6.  Skin: No rash noted.  Psychiatric: She has a normal mood and affect.  Nursing note and vitals reviewed.    ED Treatments / Results  Labs (all labs ordered are listed, but only abnormal results are displayed) Labs Reviewed  CBC WITH DIFFERENTIAL/PLATELET - Abnormal; Notable for the following:       Result Value   RBC 3.63 (*)    Hemoglobin 10.6 (*)    HCT 31.8 (*)    All other components within normal limits  COMPREHENSIVE METABOLIC PANEL - Abnormal; Notable for the following:    Glucose, Bld 109 (*)    BUN 24 (*)      Creatinine, Ser 1.87 (*)    Total Protein 8.5 (*)    ALT 12 (*)    GFR calc non Af Amer 25 (*)    GFR calc Af Amer 29 (*)    All other components within normal limits  URINALYSIS, ROUTINE W REFLEX MICROSCOPIC - Abnormal; Notable for the following:    Color, Urine STRAW (*)    Leukocytes, UA MODERATE (*)    Bacteria, UA MANY (*)    Squamous Epithelial / LPF 0-5 (*)    All other components within normal limits  PROTIME-INR - Abnormal; Notable for the following:    Prothrombin Time 39.4 (*)    All other components within normal limits  URINE CULTURE  TROPONIN I  I-STAT CG4 LACTIC ACID, ED    EKG  EKG Interpretation  Date/Time:  Tuesday September 08 2016 01:57:01 EDT Ventricular Rate:  119 PR Interval:  142 QRS Duration: 92 QT Interval:  330 QTC Calculation: 464 R Axis:   26 Text Interpretation:  Sinus tachycardia Nonspecific ST and T wave abnormality Abnormal ECG Confirmed by Gilda Crease [phone]) on 09/08/2016 2:47:53 AM       Radiology No results found.  Procedures Procedures (including critical care time)  Orthostatic VS - Lying from 09/07/16 0504 to 09/08/16 0504   Date/Time  09/08/16 0250  BP- Lying: 158/85  Pulse- Lying: 92  User: CAW   Orthostatic VS - Sitting from 09/07/16 0504 to 09/08/16 0504   Date/Time  09/08/16 0250  BP- Sitting:  167/110  Pulse- Sitting: 120  Who: CAW   Orthostatic VS - Standing from 09/07/16 0504 to 09/08/16 0504   Date/Time  09/08/16 0250  BP- Standing at 0 minutes:  188/98  Pulse- Standing at 0 minutes: 121  Who: CAW       CVP/ICP     Medications Ordered in ED Medications  cefTRIAXone (ROCEPHIN) 1 g in dextrose 5 % 50 mL IVPB (1 g Intravenous New Bag/Given 09/08/16 0454)  sodium chloride 0.9 % bolus 1,000 mL (1,000 mLs Intravenous New Bag/Given 09/08/16 0331)     Initial Impression /  Assessment and Plan / ED Course  I have reviewed the triage vital signs and the nursing notes.  Pertinent labs & imaging  results that were available during my care of the patient were reviewed by me and considered in my medical decision making (see chart for details).     BP (!) 151/99   Pulse 94   Temp 99 F (37.2 C) (Oral)   Resp 18   Ht 5\' 7"  (1.702 m)   Wt 68.9 kg (152 lb)   SpO2 100%   BMI 23.81 kg/m    Final Clinical Impressions(s) / ED Diagnoses   Final diagnoses:  Orthostatic hypotension  Acute lower UTI    New Prescriptions New Prescriptions   No medications on file   3:17 AM Patient here with complaints of dizziness that sounds more lightheadedness. She has a positive orthostasis on exam. She has history of anemia, her current hemoglobin is 10.6, near baseline. Evidence of chronic kidney disease with creatinine of 1.87, slightly worsened from her baseline. She'll benefit from IV hydration. EKG shows a tachycardia with underlying A. fib. Normal troponin.  4:53 AM UA showed moderate leukocytes esterase, and WBC too numerous to count with many bacteria. This finding suggestive of a potential urinary tract infection. Patient denies any dysuria. However, giving her presenting complaint, she will benefit from antibiotics including Rocephin. Urine culture sent. Patient does report been symptomatic with orthostasis. Given her age and her presenting complaint, plan to consult hospitalist for admission.  Doubt sepsis.   5:32 AM Appreciate consultation from [facility] Dr. Katrinka BlazingSmith who agrees to see pt in the ER and will admit for further care.  Pt voice understanding and agrees with plan.     Fayrene Helperran, Makenzye Troutman, PA-C 09/08/16 0533    Gilda CreasePollina, Christopher J, MD 09/08/16 [phone]

## 2016-09-08 NOTE — ED Triage Notes (Signed)
Pt to ED with c/o having dizzy spells,.   Pt st's the dizziness comes and goes, onset at 11:00pm   Pt denies any other symptoms

## 2016-09-08 NOTE — Progress Notes (Addendum)
PROGRESS NOTE    Robin Snow  ZOX:[id number] DOB: [birth year] DOA: 09/08/2016 PCP: [name], MD    Brief Narrative:  Patient is a pleasant 76 year old female history of paroxysmal atrial fibrillation on chronic anticoagulation, hypertension, hyperlipidemia, CVA, chronic kidney disease stage III presented with complaints of dizziness and lightheadedness occurring over the past several months. Patient recently assessed by EP, Dr. Graciela Husbands, on 08/20/2016 for possible tachybradycardia syndrome at which point in time her metoprolol dose was decreased and Norvasc added for blood pressure control with hopes of improvement with the dizziness. Patient presents back to the ED with no significant changes in her dizziness and lightheadedness. In the ED patient was noted to have another episode of dizziness and lightheadedness and noted to have multiple pauses ranging from 4.02-4.80 seconds the morning of 09/08/2016. Patient's rate limiting medications discontinued. Pacer pads the bedside. Atropine to bedside. Cardiology consultation.   Assessment & Plan:   Principal Problem:   Sinus pause: 4.02-4.80sec per telemetry 09/08/2016 Active Problems:   Tachy-brady syndrome (HCC)   Permanent atrial fibrillation (HCC)   GERD (gastroesophageal reflux disease)   Essential hypertension   HLD (hyperlipidemia)   Cerebrovascular accident (CVA) due to embolism of basilar artery (HCC)   Diabetes mellitus with complication (HCC)   Acute kidney injury superimposed on chronic kidney disease (HCC)   CKD (chronic kidney disease) stage 3, GFR 30-59 ml/min   Sinus bradycardia   Orthostatic hypotension   Dizziness   Supratherapeutic INR   Chronic diastolic CHF (congestive heart failure) (HCC)  #1 multiple sinus pauses (4.02-4.80 sec)/tachybradycardia syndrome Patient presented with episodes of dizziness and lightheadedness ongoing for past few months and worsening over the past few days. Patient was seen in  the outpatient setting by cardiology/EP Dr Graciela Husbands 08/20/2016 for tachybradycardia syndrome. At that time patient's metoprolol dose was decreased. Norvasc added to her regimen for better blood pressure control. Patient noted in the ED to have spells of dizziness and lightheadedness associated with several pauses ranging from 4.02-4.80sec. Will change bed assignment to stepdown unit. Will make patient nothing by mouth. Check a 2-D echo. Check a basic metabolic profile. Check a magnesium level. Check a TSH. Check a chest x-ray. Discontinue metoprolol and Cardizem. INR supratherapeutic Cardizem on hold. Will likely need to reverse supratherapeutic INR in anticipation of pacemaker placement. Pacer pads the bedside. Atropine to bedside. Consulted cardiology for probable pacemaker placement and further evaluation and management.  #2 UTI Urine cultures pending. Continue IV Rocephin.  #3 acute on chronic kidney disease stage III Baseline creatinine 1.57. Creatinine on admission 1.87. Check a urine sodium and a urine creatinine. Gentle hydration. Hold nephrotoxic agents.  #4 chronic diastolic heart failure 2-D echo with EF of 65-70% from January 2018. Patient is compensated. Will have to discontinue beta blocker and Cardizem secondary to problem #1. Follow.  #5 hypertension Discontinue metoprolol. Continue Norvasc. May need to increase Norvasc to 10 mg daily. If better blood pressure control is needed may consider starting patient on hydralazine.  #6 A. fib with supratherapeutic INR Patient currently going through tachybradycardia syndrome and sinus pauses and a such will discontinue Cardizem and metoprolol for now. We'll need to hold Coumadin as INR supratherapeutic. INR may need to be reversed in anticipation of possible pacemaker placement. Cardiology consultation pending.  #7 gastroesophageal reflux disease PPI.  #8 history of CVA INR supratherapeutic. PT/OT.  #9 hyperlipidemia Check a fasting lipid  panel. Continue statin.   DVT prophylaxis: Supratherapeutic INR of 3.93. Coumadin per pharmacy. Code  Status: Full Family Communication: Updated patient. No family at bedside. Disposition Plan: Change bed status to inpatient stepdown unit. Home pending cardiology evaluation and when workup is complete.   Consultants:   Cardiology pending  Procedures:   None  Antimicrobials:   IV Rocephin 09/08/2016.   Subjective: Patient denies any chest pain. No shortness of breath. Patient noted to have episodes of dizziness and lightheadedness this morning which per nursing were associated with 4-5 second pauses noted on telemetry.  Objective: Vitals:   09/08/16 0430 09/08/16 0500 09/08/16 0600 09/08/16 0700  BP: 136/64 (!) 146/89 (!) 120/58 130/73  Pulse: (!) 39 (!) 104 61 (!) 55  Resp:  14 (!) 22 (!) 32  Temp:      TempSrc:      SpO2: 96% 98% 96% 96%  Weight:      Height:       No intake or output data in the 24 hours ending 09/08/16 0809 Filed Weights   09/08/16 0152  Weight: 68.9 kg (152 lb)    Examination:  General exam: Appears calm and comfortable  Respiratory system: Clear to auscultation. Respiratory effort normal. Cardiovascular system: Irregularly irregular. No JVD, murmurs, rubs, gallops or clicks. No pedal edema. Gastrointestinal system: Abdomen is nondistended, soft and nontender. No organomegaly or masses felt. Normal bowel sounds heard. Central nervous system: Alert and oriented. No focal neurological deficits. Extremities: Symmetric 5 x 5 power. Nonpitting bilateral lower extremity edema. Skin: No rashes, lesions or ulcers Psychiatry: Judgement and insight appear normal. Mood & affect appropriate.     Data Reviewed: I have personally reviewed following labs and imaging studies  CBC:  Recent Labs Lab 09/08/16 0201  WBC 6.3  NEUTROABS 2.7  HGB 10.6*  HCT 31.8*  MCV 87.6  PLT 166   Basic Metabolic Panel:  Recent Labs Lab 09/08/16 0201  NA 137    K 3.7  CL 101  CO2 26  GLUCOSE 109*  BUN 24*  CREATININE 1.87*  CALCIUM 9.1   GFR: Estimated Creatinine Clearance: 24.5 mL/min (A) (by C-G formula based on SCr of 1.87 mg/dL (H)). Liver Function Tests:  Recent Labs Lab 09/08/16 0201  AST 20  ALT 12*  ALKPHOS 94  BILITOT 0.8  PROT 8.5*  ALBUMIN 3.9   No results for input(s): LIPASE, AMYLASE in the last 168 hours. No results for input(s): AMMONIA in the last 168 hours. Coagulation Profile:  Recent Labs Lab 09/08/16 0316  INR 3.93   Cardiac Enzymes:  Recent Labs Lab 09/08/16 0201  TROPONINI <0.03   BNP (last 3 results)  Recent Labs  04/24/16 1611 07/24/16 1016  PROBNP 669 87.0   HbA1C: No results for input(s): HGBA1C in the last 72 hours. CBG: No results for input(s): GLUCAP in the last 168 hours. Lipid Profile: No results for input(s): CHOL, HDL, LDLCALC, TRIG, CHOLHDL, LDLDIRECT in the last 72 hours. Thyroid Function Tests: No results for input(s): TSH, T4TOTAL, FREET4, T3FREE, THYROIDAB in the last 72 hours. Anemia Panel: No results for input(s): VITAMINB12, FOLATE, FERRITIN, TIBC, IRON, RETICCTPCT in the last 72 hours. Sepsis Labs:  Recent Labs Lab 09/08/16 0442  LATICACIDVEN 0.92    No results found for this or any previous visit (from the past 240 hour(s)).       Radiology Studies: No results found.      Scheduled Meds: . amLODipine  5 mg Oral Daily  . ferrous sulfate  325 mg Oral BID  . gabapentin  100 mg Oral QHS  .  gabapentin  300 mg Oral TID WC  . metolazone  2.5 mg Oral Q Tue  . oxybutynin  5 mg Oral Daily  . pravastatin  20 mg Oral q1800   Continuous Infusions: . sodium chloride       LOS: 0 days    Time spent: 50 mins  Greater than 30 mins of critical care time spent on patient, reviewing the chart, assessing the patient, reviewing cardiac strips, ordering labs and xrays, consulting cardiology, implementing life sustaining treatment.    Ramiro HarvestHOMPSON,DANIEL,  MD [facility] Pager [phone]  If 7PM-7AM, please contact night-coverage [email or web address] Password [email or web address] 09/08/2016, 8:09 AM

## 2016-09-08 NOTE — Consult Note (Signed)
ELECTROPHYSIOLOGY CONSULT NOTE    Patient ID: Robin DutyBarbara P Aquilar MRN: [id number], DOB/AGE: 77/20/1942 77 y.o.  Admit date: 09/08/2016 Date of Consult: 09/08/2016  Primary Physician: Myrlene Brokerrawford, Elizabeth A, MD Primary Cardiologist: Clifton JamesMcAlhany Electrophysiologist: Graciela HusbandsKlein  Reason for Consultation: pauses and dizziness  HPI:  Robin Snow is a 76 y.o. female is referred by Dr Janee Mornhompson for evaluation of symptomatic bradycardia.  She has a past medical history significant for persistent atrial fibrillation, diabetes, hypertension, CKD, obesity, and pulmonary hypertension. She was seen by Dr Graciela HusbandsKlein in the office recently for evaluation of tachy/brady syndrome.  Her Metoprolol was discontinued and she has had persistent dizziness for which she presented to the ER last night. On telemetry, she has had AF with RVR as well as post termination pauses of up to 4 seconds which are symptomatic. EP has been asked to evaluate for treatment options.  Last echo 04/2016 demonstrated EF 65-70%, moderate MR, LA severely dilated, PA pressure 52.  She currently denies chest pain, shortness of breath, LE edema, recent fevers or chills. She has had dizziness but not frank syncope. Her dizziness corresponds with pauses on telemetry.   Past Medical History:  Diagnosis Date  . AKI (acute kidney injury) (HCC) 04/2016  . Anemia   . Arthritis   . Chronic atrial fibrillation (HCC)   . Chronic edema   . Chronic venous insufficiency   . CKD (chronic kidney disease), stage III   . Coagulopathy (HCC)   . Diabetes mellitus    Type 2  . Diverticulosis   . Dyslipidemia   . GERD (gastroesophageal reflux disease)   . GI bleed 2015   a. lower GI from diverticular source 2015.  [name]. Hx of transfusion of packed red blood cells   . Hypertension   . Morbid obesity (HCC)   . Pulmonary hypertension (HCC)   . Pyelonephritis 04/2016  . Renal infarct (HCC)    a. 04/2016- adm with flank pain, ? pyelo vs renal infarct in setting of  recent subtherapeutic INR.  [name]. Sinus bradycardia   . Stroke [facility](HCC) 2015     Surgical History:  Past Surgical History:  Procedure Laterality Date  . CESAREAN SECTION    . COLONOSCOPY Left 01/24/2013   Procedure: COLONOSCOPY;  Surgeon: Willis ModenaWilliam Outlaw, MD;  [facility];  Service: Endoscopy;  Laterality: Left;  . ESOPHAGOGASTRODUODENOSCOPY Left 01/23/2013   Procedure: ESOPHAGOGASTRODUODENOSCOPY (EGD);  Surgeon: Willis ModenaWilliam Outlaw, MD;  Location[facility] END[facility];  Service: Endoscopy;  Laterality: Left;  . EYE SURGERY Bilateral    cataract surgery  . JOINT REPLACEMENT    . REPLACEMENT TOTAL KNEE BILATERAL    . TUBAL LIGATION        (Not in a hospital admission)  Inpatient Medications:  . amLODipine  5 mg Oral Daily  . ferrous sulfate  325 mg Oral BID  . gabapentin  100 mg Oral QHS  . gabapentin  300 mg Oral TID WC  . metolazone  2.5 mg Oral Q Tue  . oxybutynin  5 mg Oral Daily  . pantoprazole  40 mg Oral Q0600  . pravastatin  20 mg Oral q1800    Allergies:  Allergies  Allergen Reactions  . Aspirin Hives  . Hydrocodone Hives    Tolerates oxycodone and tramadol  . Rofecoxib Hives    Social History   Social History  . Marital status: Widowed    Spouse name: [name]  . Number of children: 4  . Years of education: 12   Occupational History  .  Not on file.   Social History Main Topics  . Smoking status: Former Smoker    Types: Cigarettes    Quit date: 07/07/1993  . Smokeless tobacco: Never Used     Comment: .3 cig a day  . Alcohol use No     Comment: no longer drinks alcohol  . Drug use: No  . Sexual activity: Not on file   Other Topics Concern  . Not on file   Social History Narrative   Patient is widowed and has 4 living children, and 2 deceased children.   Patient is right handed.   Patient has hs education.   Patient drinks coffee and soda once a week.     Family History  Problem Relation Age of Onset  . Cancer Father   . Stroke Maternal Aunt       Review of Systems: All other systems reviewed and are otherwise negative except as noted above.  Physical Exam: Vitals:   09/08/16 0430 09/08/16 0500 09/08/16 0600 09/08/16 0700  BP: 136/64 (!) 146/89 (!) 120/58 130/73  Pulse: (!) 39 (!) 104 61 (!) 55  Resp:  14 (!) 22 (!) 32  Temp:      TempSrc:      SpO2: 96% 98% 96% 96%  Weight:      Height:        GEN- The patient is elderly and obese appearing, alert and oriented x 3 today.   HEENT: normocephalic, atraumatic; sclera clear, conjunctiva pink; hearing intact; oropharynx clear; neck supple  Lungs- Clear to ausculation bilaterally, normal work of breathing.  No wheezes, rales, rhonchi Heart- Tachycardic irregular rate and rhythm  GI- soft, non-tender, non-distended, bowel sounds present  Extremities- no clubbing, cyanosis, or edema  MS- no significant deformity or atrophy Skin- warm and dry, no rash or lesion Psych- euthymic mood, full affect Neuro- strength and sensation are intact  Labs:   Lab Results  Component Value Date   WBC 6.3 09/08/2016   HGB 10.6 (L) 09/08/2016   HCT 31.8 (L) 09/08/2016   MCV 87.6 09/08/2016   PLT 166 09/08/2016     Recent Labs Lab 09/08/16 0201  NA 137  K 3.7  CL 101  CO2 26  BUN 24*  CREATININE 1.87*  CALCIUM 9.1  PROT 8.5*  BILITOT 0.8  ALKPHOS 94  ALT 12*  AST 20  GLUCOSE 109*      Radiology/Studies: Dg Chest Port 1 View Result Date: 09/08/2016 CLINICAL DATA:  Dizziness.  Atrial fibrillation. EXAM: PORTABLE CHEST 1 VIEW COMPARISON:  11/13/2013 FINDINGS: Cardiomegaly. Pulmonary vascularity is within normal limits. No infiltrates or effusions. Calcification in the arch of the aorta. Neurostimulator wires in the thoracic spine. No acute bone abnormality. IMPRESSION:: IMPRESSION: Chronic cardiomegaly.  No acute abnormalities. Aortic atherosclerosis. Electronically Signed   By: Francene Boyers M.D.   On: 09/08/2016 08:36    ZOX:[id number] atrial flutter, rate 126 (personally  reviewed)  TELEMETRY: AF with RVR, post termination pauses of up to 4 seconds (personally reviewed)   Assessment/Plan: 1.  Tachy/brady syndrome The patient has tachy/brady syndrome with symptomatic post termination pauses.  These are persistent despite decreasing rate control. She will require pacemaker implantation for treatment of tachy/brady syndrome. Risks, benefits reviewed with patient today who wishes to proceed. Unfortunately, her INR is >3.5 today. Will give 1mg  of Vitamin K and hold coumadin today.  I have also encouraged her to eat greens for lunch and dinner tonight. Bedrest until pacemaker placed Keep pacer pads on  and atropine at bedside She is aware of pauses and has dizziness but no syncope. Hopefully we can avoid temp wire placement.  2.  Persistent atrial fibrillation As above Will add back rate control after pacemaker implanted Continue Warfarin long term for CHADS2VASC of 7. She has very labile INR's, but is unable to afford NOAC's. Will ask case manger to evaluate for patient assistance  3.  UTI Per primary team No fevers or chills  Normal WBC  4.  HTN Stable No change required today   Signed, Gypsy Balsam, NP 09/08/2016 8:41 AM  EP attending  Patient seen and examined. Agree with the findings as documented above. The patient is a 76 year old woman who was admitted for episodes of persistent dizziness, and found to have periods of asystole for up to 4 seconds. In addition she has had atrial fibrillation with ventricular rates in the 130 range. These episodes come and go. She has been on metoprolol in the past but this was discontinued and she is currently on Cardizem. Her exam demonstrates a pleasant 76 year old woman in no distress. Lungs are clear bilaterally. Cardiovascular exam revealed regular as well as an irregular tachycardia and a regular bradycardia. Lungs were clear bilaterally to auscultation. Abdominal exam was soft nontender. Extremity demonstrated  no edema. Neurologic exam was nonfocal. EKG demonstrates atrial fibrillation with a rapid ventricular response. Telemetry demonstrates pauses of up to 4 seconds with sinus bradycardia in the 30s and atrial fibrillation in the 130s. Labs and chest x-ray have been reviewed.  Assessment and plan 1. Symptomatic bradycardia tachycardia syndrome. I discussed treatment options with the patient and recommended permanent pacemaker insertion. This will be planned once her INR is improved. 2. Hypertension. Once her pacemaker is placed, would consider reinitiation of metoprolol or consider carvedilol. 3. Atrial fibrillation with a rapid ventricular response - the patient is currently on warfarin. Might consider an oral anticoagulant if possible at discharge, once her pocket is well-healed. 4. Remote stroke. She has no neurological symptoms except for dizziness which coincides with her bradycardia.  Lewayne Bunting, M.D.

## 2016-09-08 NOTE — ED Notes (Signed)
Pt having 4-6 second pauses-- admitting dr notified-- pt underlying rhythm is NSR to Stach with PVC's and pauses. Bed assignment changed to stepdown.

## 2016-09-08 NOTE — ED Provider Notes (Signed)
Patient presents to the emergency department for evaluation of intermittent episodes of dizziness. Symptoms have been ongoing for approximately 1 month.  Face to face Exam: HEENT - PERRLA Lungs - CTAB Heart - regular tachycardia, no M/R/G Abd - S/NT/ND Neuro - alert, oriented x3  Plan: Patient noted to be orthostatic upon arrival, will further evaluate and administer IV fluids.   Gilda CreasePollina, Millena Callins J, MD 09/08/16 [phone]

## 2016-09-08 NOTE — ED Notes (Signed)
Cardiology at bedside.

## 2016-09-09 ENCOUNTER — Encounter (HOSPITAL_COMMUNITY)
Admission: EM | Disposition: A | Discharge status: Home / Self Care | Payer: Self-pay | Source: Home / Self Care | Attending: Internal Medicine

## 2016-09-09 DIAGNOSIS — I495 Sick sinus syndrome: Principal | ICD-10-CM

## 2016-09-09 DIAGNOSIS — I6312 Cerebral infarction due to embolism of basilar artery: Secondary | ICD-10-CM

## 2016-09-09 DIAGNOSIS — N183 Chronic kidney disease, stage 3 (moderate): Secondary | ICD-10-CM

## 2016-09-09 DIAGNOSIS — E118 Type 2 diabetes mellitus with unspecified complications: Secondary | ICD-10-CM

## 2016-09-09 HISTORY — PX: PACEMAKER IMPLANT: EP1218

## 2016-09-09 LAB — BASIC METABOLIC PANEL
Anion gap: 8 (ref 5–15)
BUN: 15 mg/dL (ref 6–20)
CHLORIDE: 103 mmol/L (ref 101–111)
CO2: 28 mmol/L (ref 22–32)
Calcium: 8.6 mg/dL — ABNORMAL LOW (ref 8.9–10.3)
Creatinine, Ser: 1.31 mg/dL — ABNORMAL HIGH (ref 0.44–1.00)
GFR calc Af Amer: 44 mL/min — ABNORMAL LOW (ref >60)
GFR calc non Af Amer: 38 mL/min — ABNORMAL LOW (ref >60)
Glucose, Bld: 100 mg/dL — ABNORMAL HIGH (ref 65–99)
POTASSIUM: 3.3 mmol/L — AB (ref 3.5–5.1)
SODIUM: 139 mmol/L (ref 135–145)

## 2016-09-09 LAB — GLUCOSE, CAPILLARY
Glucose-Capillary: 92 mg/dL (ref 65–99)
Glucose-Capillary: 133 mg/dL — ABNORMAL HIGH (ref 65–99)

## 2016-09-09 LAB — PROTIME-INR
INR: 2.36
PROTHROMBIN TIME: 26.2 s — AB (ref 11.4–15.2)

## 2016-09-09 LAB — CBC
HCT: 29.6 % — ABNORMAL LOW (ref 36.0–46.0)
HEMOGLOBIN: 9.6 g/dL — AB (ref 12.0–15.0)
MCH: 28.6 pg (ref 26.0–34.0)
MCHC: 32.4 g/dL (ref 30.0–36.0)
MCV: 88.1 fL (ref 78.0–100.0)
Platelets: 152 10*3/uL (ref 150–400)
RBC: 3.36 MIL/uL — AB (ref 3.87–5.11)
RDW: 14.8 % (ref 11.5–15.5)
WBC: 4.5 10*3/uL (ref 4.0–10.5)

## 2016-09-09 LAB — SURGICAL PCR SCREEN
MRSA, PCR: NEGATIVE
STAPHYLOCOCCUS AUREUS: NEGATIVE

## 2016-09-09 SURGERY — PACEMAKER IMPLANT

## 2016-09-09 MED ORDER — METOPROLOL TARTRATE 5 MG/5ML IV SOLN
INTRAVENOUS | Status: DC | PRN
  Administered 2016-09-09: 5 mg via INTRAVENOUS

## 2016-09-09 MED ORDER — METOPROLOL TARTRATE 5 MG/5ML IV SOLN
INTRAVENOUS | Status: AC
  Filled 2016-09-09: qty 5

## 2016-09-09 MED ORDER — CEFAZOLIN SODIUM-DEXTROSE 2-4 GM/100ML-% IV SOLN
INTRAVENOUS | Status: AC
Start: 2016-09-09 — End: ?
  Filled 2016-09-09: qty 100

## 2016-09-09 MED ORDER — FUROSEMIDE 10 MG/ML IJ SOLN
40.0000 mg | Freq: Two times a day (BID) | INTRAMUSCULAR | Status: DC
  Administered 2016-09-09 – 2016-09-10 (×2): 40 mg via INTRAVENOUS
  Filled 2016-09-09 (×2): qty 4

## 2016-09-09 MED ORDER — HEPARIN (PORCINE) IN NACL 2-0.9 UNIT/ML-% IJ SOLN
INTRAMUSCULAR | Status: AC
Start: 2016-09-09 — End: ?
  Filled 2016-09-09: qty 500

## 2016-09-09 MED ORDER — MIDAZOLAM HCL 5 MG/5ML IJ SOLN
INTRAMUSCULAR | Status: DC | PRN
  Administered 2016-09-09 (×5): 1 mg via INTRAVENOUS

## 2016-09-09 MED ORDER — CEFAZOLIN SODIUM-DEXTROSE 1-4 GM/50ML-% IV SOLN
1.0000 g | Freq: Four times a day (QID) | INTRAVENOUS | Status: AC
  Administered 2016-09-09 – 2016-09-10 (×3): 1 g via INTRAVENOUS
  Filled 2016-09-09 (×3): qty 50

## 2016-09-09 MED ORDER — OFF THE BEAT BOOK
Freq: Once | Status: AC
  Administered 2016-09-09: 16:00:00
  Filled 2016-09-09: qty 1

## 2016-09-09 MED ORDER — MIDAZOLAM HCL 5 MG/5ML IJ SOLN
INTRAMUSCULAR | Status: AC
  Filled 2016-09-09: qty 5

## 2016-09-09 MED ORDER — IOPAMIDOL (ISOVUE-370) INJECTION 76%
INTRAVENOUS | Status: DC | PRN
  Administered 2016-09-09: 10 mL via INTRAVENOUS

## 2016-09-09 MED ORDER — ONDANSETRON HCL 4 MG/2ML IJ SOLN: 4.0000 mg | Freq: Four times a day (QID) | INTRAMUSCULAR | Status: DC | PRN

## 2016-09-09 MED ORDER — POTASSIUM CHLORIDE CRYS ER 20 MEQ PO TBCR
40.0000 meq | EXTENDED_RELEASE_TABLET | Freq: Every day | ORAL | Status: DC
  Administered 2016-09-09 – 2016-09-10 (×2): 40 meq via ORAL
  Filled 2016-09-09: qty 2

## 2016-09-09 MED ORDER — SODIUM CHLORIDE 0.9 % IR SOLN
Status: AC
  Filled 2016-09-09: qty 2

## 2016-09-09 MED ORDER — LIDOCAINE HCL (PF) 1 % IJ SOLN
INTRAMUSCULAR | Status: DC | PRN
  Administered 2016-09-09: 45 mL via INTRADERMAL

## 2016-09-09 MED ORDER — HEPARIN (PORCINE) IN NACL 2-0.9 UNIT/ML-% IJ SOLN
INTRAMUSCULAR | Status: AC | PRN
  Administered 2016-09-09: 500 mL

## 2016-09-09 MED ORDER — POTASSIUM CHLORIDE CRYS ER 20 MEQ PO TBCR: 20.0000 meq | EXTENDED_RELEASE_TABLET | ORAL | Status: DC

## 2016-09-09 MED ORDER — POTASSIUM CHLORIDE CRYS ER 20 MEQ PO TBCR
20.0000 meq | EXTENDED_RELEASE_TABLET | Freq: Every day | ORAL | Status: DC
  Administered 2016-09-09: 16:00:00 20 meq via ORAL
  Filled 2016-09-09: qty 1

## 2016-09-09 MED ORDER — LIVING BETTER WITH HEART FAILURE BOOK: Freq: Once | Status: DC

## 2016-09-09 MED ORDER — POTASSIUM CHLORIDE CRYS ER 20 MEQ PO TBCR
40.0000 meq | EXTENDED_RELEASE_TABLET | Freq: Four times a day (QID) | ORAL | Status: AC
  Administered 2016-09-09: 20:00:00 40 meq via ORAL
  Filled 2016-09-09 (×2): qty 2

## 2016-09-09 MED ORDER — FENTANYL CITRATE (PF) 100 MCG/2ML IJ SOLN
INTRAMUSCULAR | Status: AC
  Filled 2016-09-09: qty 2

## 2016-09-09 MED ORDER — IOPAMIDOL (ISOVUE-370) INJECTION 76%
INTRAVENOUS | Status: AC
  Filled 2016-09-09: qty 50

## 2016-09-09 MED ORDER — ACETAMINOPHEN 325 MG PO TABS
325.0000 mg | ORAL_TABLET | ORAL | Status: DC | PRN
  Administered 2016-09-09: 650 mg via ORAL
  Filled 2016-09-09: qty 2

## 2016-09-09 MED ORDER — LIDOCAINE HCL (PF) 1 % IJ SOLN
INTRAMUSCULAR | Status: AC
  Filled 2016-09-09: qty 60

## 2016-09-09 MED ORDER — FENTANYL CITRATE (PF) 100 MCG/2ML IJ SOLN
INTRAMUSCULAR | Status: DC | PRN
  Administered 2016-09-09 (×5): 12.5 ug via INTRAVENOUS

## 2016-09-09 SURGICAL SUPPLY — 13 items
CABLE SURGICAL S-101-97-12 (CABLE) ×2 IMPLANT
CATH RIGHTSITE C315HIS02 (CATHETERS) ×2 IMPLANT
HOVERMATT SINGLE USE (MISCELLANEOUS) ×2 IMPLANT
LEAD SELECT SECURE 3830 383069 (Lead) IMPLANT
LEAD TENDRIL MRI 52CM LPA1200M (Lead) ×2 IMPLANT
PACEMAKER ASSURITY DR-RF (Pacemaker) ×2 IMPLANT
PAD DEFIB LIFELINK (PAD) ×2 IMPLANT
SELECT SECURE 3830 383069 (Lead) ×3 IMPLANT
SHEATH CLASSIC 9F (SHEATH) ×2 IMPLANT
SHEATH COOK PEEL AWAY SET 8F (SHEATH) ×2 IMPLANT
SLITTER 6232ADJ (MISCELLANEOUS) ×2 IMPLANT
TRAY PACEMAKER INSERTION (PACKS) ×2 IMPLANT
WIRE HI TORQ VERSACORE-J 145CM (WIRE) ×2 IMPLANT

## 2016-09-09 NOTE — Interval H&P Note (Signed)
History and Physical Interval Note:  09/09/2016 1:03 PM  Robin Snow  has presented today for surgery, with the diagnosis of tachibradia  The various methods of treatment have been discussed with the patient and family. After consideration of risks, benefits and other options for treatment, the patient has consented to  Procedure(s): Pacemaker Implant ([name]) as a surgical intervention .  The patient's history has been reviewed, patient examined, no change in status, stable for surgery.  I have reviewed the patient's chart and labs.  Questions were answered to the patient's satisfaction.     Lewayne BuntingGregg Taylor

## 2016-09-09 NOTE — Progress Notes (Signed)
Electrophysiology Rounding Note  Patient Name: Robin Snow Date of Encounter: 09/09/2016  Primary Cardiologist: Clifton James Electrophysiologist: Graciela Husbands   Subjective   The patient is doing well today.  At this time, the patient denies chest pain, shortness of breath, or any new concerns.  Inpatient Medications    Scheduled Meds: . amLODipine  5 mg Oral Daily  . chlorhexidine  60 mL Topical Once  . ferrous sulfate  325 mg Oral BID  . gabapentin  100 mg Oral QHS  . gabapentin  300 mg Oral TID WC  . gentamicin irrigation  80 mg Irrigation To Cath  . oxybutynin  5 mg Oral Daily  . pantoprazole  40 mg Oral Q0600  . pravastatin  20 mg Oral q1800   Continuous Infusions: . sodium chloride 75 mL/hr at 09/09/16 0007  . sodium chloride    .  ceFAZolin (ANCEF) IV    . cefTRIAXone (ROCEPHIN)  IV Stopped (09/09/16 1610)   PRN Meds: atropine, ipratropium-albuterol, ondansetron **OR** ondansetron (ZOFRAN) IV   Vital Signs    Vitals:   09/08/16 1901 09/08/16 2000 09/09/16 0000 09/09/16 0400  BP: 135/85     Pulse: (!) 43 (!) 48    Resp: 18 13    Temp: 97.9 F (36.6 C)  97.9 F (36.6 C) 97.8 F (36.6 C)  TempSrc: Oral  Oral Oral  SpO2: 94% 95%    Weight:      Height:        Intake/Output Summary (Last 24 hours) at 09/09/16 0748 Last data filed at 09/09/16 0538  Gross per 24 hour  Intake              575 ml  Output              450 ml  Net              125 ml   Filed Weights   09/08/16 0152  Weight: 152 lb (68.9 kg)    Physical Exam    GEN- The patient is elderly and obese appearing, alert and oriented x 3 today.   Head- normocephalic, atraumatic Eyes-  Sclera clear, conjunctiva pink Ears- hearing intact Oropharynx- clear Neck- supple Lungs- Clear to ausculation bilaterally, normal work of breathing Heart- Irregular rate and rhythm GI- soft, NT, ND, + BS Extremities- no clubbing, cyanosis, or edema Skin- no rash or lesion Psych- euthymic mood, full  affect Neuro- strength and sensation are intact  Labs    CBC  Recent Labs  09/08/16 0201 09/09/16 0306  WBC 6.3 4.5  NEUTROABS 2.7  --   HGB 10.6* 9.6*  HCT 31.8* 29.6*  MCV 87.6 88.1  PLT 166 152   Basic Metabolic Panel  Recent Labs  09/08/16 0811 09/09/16 0306  NA 138 139  K 3.5 3.3*  CL 101 103  CO2 27 28  GLUCOSE 114* 100*  BUN 20 15  CREATININE 1.64* 1.31*  CALCIUM 8.9 8.6*  MG 1.8  --    Liver Function Tests  Recent Labs  09/08/16 0201  AST 20  ALT 12*  ALKPHOS 94  BILITOT 0.8  PROT 8.5*  ALBUMIN 3.9   Cardiac Enzymes  Recent Labs  09/08/16 0201  TROPONINI <0.03   Thyroid Function Tests  Recent Labs  09/08/16 0811  TSH 3.005    Telemetry    Rate controlled AF, no significant pauses since last night (personally reviewed)  Radiology    Dg Chest [facility] 1 View  Result  Date: 09/08/2016 CLINICAL DATA:  Dizziness.  Atrial fibrillation. EXAM: PORTABLE CHEST 1 VIEW COMPARISON:  11/13/2013 FINDINGS: Cardiomegaly. Pulmonary vascularity is within normal limits. No infiltrates or effusions. Calcification in the arch of the aorta. Neurostimulator wires in the thoracic spine. No acute bone abnormality. IMPRESSION:: IMPRESSION: Chronic cardiomegaly.  No acute abnormalities. Aortic atherosclerosis. Electronically Signed   By: Francene BoyersJames  Maxwell M.D.   On: 09/08/2016 08:36     Patient Profile     Robin Snow is a 76 y.o. female with a past medical history significant for persistent atrial fibrillation, diabetes, hypertension, CKD, obesity, and pulmonary hypertension.  She was admitted for dizziness correlating with post termination pauses with plans for PPM implant for tachybrady syndrome.   Assessment & Plan    1.  Tachy/brady syndrome Plan PPM implant today INR 2.36 Risks, benefits reviewed yesterday with patient and daughter  2.  Persistent atrial fibrillation Will add back rate control after pacemaker implanted Continue Warfarin long term  for CHADS2VASC of 7. She has very labile INR's, but is unable to afford NOAC's. Will ask case manger to evaluate for patient assistance  3.  UTI Per primary team No fevers or chills   4.  HTN Stable No change required today  Signed, Gypsy BalsamAmber Seiler, NP  09/09/2016, 7:48 AM   EP Attending  Patient seen and examined. Agree with above. The patient continued to have both pauses of up to 4 seconds and atrial fib/flutter with a RVR. She has been off of her beta blocker for over a week and off of her calcium channel blocker for 30hours. I have discussed the indications for PM insertion and she wishes to proceed.  Leonia ReevesGregg Shreya Lacasse,M.D.

## 2016-09-09 NOTE — H&P (View-Only) (Signed)
Electrophysiology Rounding Note  Patient Name: Robin Snow Date of Encounter: 09/09/2016  Primary Cardiologist: Clifton James Electrophysiologist: Graciela Husbands   Subjective   The patient is doing well today.  At this time, the patient denies chest pain, shortness of breath, or any new concerns.  Inpatient Medications    Scheduled Meds: . amLODipine  5 mg Oral Daily  . chlorhexidine  60 mL Topical Once  . ferrous sulfate  325 mg Oral BID  . gabapentin  100 mg Oral QHS  . gabapentin  300 mg Oral TID WC  . gentamicin irrigation  80 mg Irrigation To Cath  . oxybutynin  5 mg Oral Daily  . pantoprazole  40 mg Oral Q0600  . pravastatin  20 mg Oral q1800   Continuous Infusions: . sodium chloride 75 mL/hr at 09/09/16 0007  . sodium chloride    .  ceFAZolin (ANCEF) IV    . cefTRIAXone (ROCEPHIN)  IV Stopped (09/09/16 1610)   PRN Meds: atropine, ipratropium-albuterol, ondansetron **OR** ondansetron (ZOFRAN) IV   Vital Signs    Vitals:   09/08/16 1901 09/08/16 2000 09/09/16 0000 09/09/16 0400  BP: 135/85     Pulse: (!) 43 (!) 48    Resp: 18 13    Temp: 97.9 F (36.6 C)  97.9 F (36.6 C) 97.8 F (36.6 C)  TempSrc: Oral  Oral Oral  SpO2: 94% 95%    Weight:      Height:        Intake/Output Summary (Last 24 hours) at 09/09/16 0748 Last data filed at 09/09/16 0538  Gross per 24 hour  Intake              575 ml  Output              450 ml  Net              125 ml   Filed Weights   09/08/16 0152  Weight: 152 lb (68.9 kg)    Physical Exam    GEN- The patient is elderly and obese appearing, alert and oriented x 3 today.   Head- normocephalic, atraumatic Eyes-  Sclera clear, conjunctiva pink Ears- hearing intact Oropharynx- clear Neck- supple Lungs- Clear to ausculation bilaterally, normal work of breathing Heart- Irregular rate and rhythm GI- soft, NT, ND, + BS Extremities- no clubbing, cyanosis, or edema Skin- no rash or lesion Psych- euthymic mood, full  affect Neuro- strength and sensation are intact  Labs    CBC  Recent Labs  09/08/16 0201 09/09/16 0306  WBC 6.3 4.5  NEUTROABS 2.7  --   HGB 10.6* 9.6*  HCT 31.8* 29.6*  MCV 87.6 88.1  PLT 166 152   Basic Metabolic Panel  Recent Labs  09/08/16 0811 09/09/16 0306  NA 138 139  K 3.5 3.3*  CL 101 103  CO2 27 28  GLUCOSE 114* 100*  BUN 20 15  CREATININE 1.64* 1.31*  CALCIUM 8.9 8.6*  MG 1.8  --    Liver Function Tests  Recent Labs  09/08/16 0201  AST 20  ALT 12*  ALKPHOS 94  BILITOT 0.8  PROT 8.5*  ALBUMIN 3.9   Cardiac Enzymes  Recent Labs  09/08/16 0201  TROPONINI <0.03   Thyroid Function Tests  Recent Labs  09/08/16 0811  TSH 3.005    Telemetry    Rate controlled AF, no significant pauses since last night (personally reviewed)  Radiology    Dg Chest [facility] 1 View  Result  Date: 09/08/2016 CLINICAL DATA:  Dizziness.  Atrial fibrillation. EXAM: PORTABLE CHEST 1 VIEW COMPARISON:  11/13/2013 FINDINGS: Cardiomegaly. Pulmonary vascularity is within normal limits. No infiltrates or effusions. Calcification in the arch of the aorta. Neurostimulator wires in the thoracic spine. No acute bone abnormality. IMPRESSION:: IMPRESSION: Chronic cardiomegaly.  No acute abnormalities. Aortic atherosclerosis. Electronically Signed   By: Francene BoyersJames  Maxwell M.D.   On: 09/08/2016 08:36     Patient Profile     Robin Snow is a 76 y.o. female with a past medical history significant for persistent atrial fibrillation, diabetes, hypertension, CKD, obesity, and pulmonary hypertension.  She was admitted for dizziness correlating with post termination pauses with plans for PPM implant for tachybrady syndrome.   Assessment & Plan    1.  Tachy/brady syndrome Plan PPM implant today INR 2.36 Risks, benefits reviewed yesterday with patient and daughter  2.  Persistent atrial fibrillation Will add back rate control after pacemaker implanted Continue Warfarin long term  for CHADS2VASC of 7. She has very labile INR's, but is unable to afford NOAC's. Will ask case manger to evaluate for patient assistance  3.  UTI Per primary team No fevers or chills   4.  HTN Stable No change required today  Signed, Gypsy BalsamAmber Seiler, NP  09/09/2016, 7:48 AM   EP Attending  Patient seen and examined. Agree with above. The patient continued to have both pauses of up to 4 seconds and atrial fib/flutter with a RVR. She has been off of her beta blocker for over a week and off of her calcium channel blocker for 30hours. I have discussed the indications for PM insertion and she wishes to proceed.  Leonia ReevesGregg Taylor,M.D.

## 2016-09-09 NOTE — Care Management Note (Signed)
Case Management Note  Patient Details  Name: Robin Snow MRN: [id number] Date of Birth: [birth year]  Subjective/Objective:   From home, s/p pacemaker implant.   PCP Hillard DankerElizabeth Crawford                  Action/Plan: NCM will cont to follow for dc needs.   Expected Discharge Date:                  Expected Discharge Plan:     In-House Referral:     Discharge planning Services  CM Consult  Post Acute Care Choice:    Choice offered to:     DME Arranged:    DME Agency:     HH Arranged:    HH Agency:     Status of Service:  In process, will continue to follow  If discussed at Long Length of Stay Meetings, dates discussed:    Additional Comments:  Leone Havenaylor, Hussain Maimone Clinton, RN 09/09/2016, 4:07 PM

## 2016-09-09 NOTE — Progress Notes (Signed)
PROGRESS NOTE    Robin Snow  ZOX:[id number] DOB: [birth year] DOA: 09/08/2016 PCP: Myrlene Brokerrawford, Elizabeth A, MD   Subjective: Nausea complained this morning, pacemaker be placed later today. Bilateral lower extremity swelling, IV fluids discontinued.  Brief Narrative:  Patient is a pleasant 76 year old female history of paroxysmal atrial fibrillation on chronic anticoagulation, hypertension, hyperlipidemia, CVA, chronic kidney disease stage III presented with complaints of dizziness and lightheadedness occurring over the past several months. Patient recently assessed by EP, [name], on 08/20/2016 for possible tachybradycardia syndrome at which point in time her metoprolol dose was decreased and Norvasc added for blood pressure control with hopes of improvement with the dizziness. Patient presents back to the ED with no significant changes in her dizziness and lightheadedness. In the ED patient was noted to have another episode of dizziness and lightheadedness and noted to have multiple pauses ranging from 4.02-4.80 seconds the morning of 09/08/2016. Patient's rate limiting medications discontinued. Pacer pads the bedside. Atropine to bedside. Cardiology consultation.   Assessment & Plan:   Principal Problem:   Sinus pause: 4.02-4.80sec per telemetry 09/08/2016 Active Problems:   Permanent atrial fibrillation (HCC)   GERD (gastroesophageal reflux disease)   Essential hypertension   HLD (hyperlipidemia)   Cerebrovascular accident (CVA) due to embolism of basilar artery (HCC)   Diabetes mellitus with complication (HCC)   Acute kidney injury superimposed on chronic kidney disease (HCC)   CKD (chronic kidney disease) stage 3, GFR 30-59 ml/min   Sinus bradycardia   Orthostatic hypotension   Dizziness   Supratherapeutic INR   Chronic diastolic CHF (congestive heart failure) (HCC)   Tachy-brady syndrome (HCC)   Multiple sinus pauses (4.02-4.80 sec)/tachybradycardia syndrome -Presented  with dizziness and lightheadedness, episodic. -She was evaluated by cardiology as outpatient for tachybradycardia syndrome, metoprolol dose decreased. -Presented to the T with spells of lightheadedness associated with several pauses longest is 4.8 seconds. -Negative 2-D echo, metoprolol and Cardizem discontinued magnesium and TSH within normal limits. -She will have pacemaker placed today.  UTI -Urinalysis consistent with UTI with TNTC WBC, on Rocephin we'll adjust according to culture results.  Acute on chronic kidney disease stage III -Baseline creatinine 1.5, creatinine on admission 1.8. -Currently creatinine 1.3, has significant lower extremity edema discontinue IV fluids. Restart back on Lasix.  Chronic diastolic heart failure -2-D echo with EF of 65-70% from January 2018. Patient is compensated.  -Currently has significant lower extremity edema likely secondary to IV fluids, IV fluids discontinued, started on IV diuresis.  Hypertension -Metoprolol and Cardizem discontinued, avoid AV nodal blocking agents prior to the PPM placement. -Avoid nephrotoxic medications because of CKD. On Amlodipine, continue.  A. fib with supratherapeutic INR Patient currently going through tachybradycardia syndrome and sinus pauses and a such will discontinue Cardizem and metoprolol for now. We'll need to hold Coumadin as INR supratherapeutic. INR may need to be reversed in anticipation of possible pacemaker placement. Cardiology consultation pending.  Gastroesophageal reflux disease PPI.  History of CVA INR supratherapeutic. PT/OT.  Hyperlipidemia Check a fasting lipid panel. Continue statin.   DVT prophylaxis: INR is 2.3 today. Code Status: Full Family Communication: Updated patient. No family at bedside. Disposition Plan: Continue on step down for tonight.   Consultants:   Cardiology pending  Procedures:   None  Antimicrobials:   IV Rocephin 09/08/2016.    Objective: Vitals:     09/09/16 0400 09/09/16 0700 09/09/16 0837 09/09/16 1011  BP:   (!) 130/115 (!) 154/82  Pulse:   (!) 59 61  Resp:  15 16  Temp: 97.8 F (36.6 C) 98.2 F (36.8 C)    TempSrc: Oral Oral    SpO2:   99% 97%  Weight:      Height:        Intake/Output Summary (Last 24 hours) at 09/09/16 1048 Last data filed at 09/09/16 0900  Gross per 24 hour  Intake          1182.75 ml  Output              450 ml  Net           732.75 ml   Filed Weights   09/08/16 0152  Weight: 68.9 kg (152 lb)    Examination:  General exam: Appears calm and comfortable  Respiratory system: Clear to auscultation. Respiratory effort normal. Cardiovascular system: Irregularly irregular. No JVD, murmurs, rubs, gallops or clicks. No pedal edema. Gastrointestinal system: Abdomen is nondistended, soft and nontender. No organomegaly or masses felt. Normal bowel sounds heard. Central nervous system: Alert and oriented. No focal neurological deficits. Extremities: Symmetric 5 x 5 power. Nonpitting bilateral lower extremity edema. Skin: No rashes, lesions or ulcers Psychiatry: Judgement and insight appear normal. Mood & affect appropriate.     Data Reviewed: I have personally reviewed following labs and imaging studies  CBC:  Recent Labs Lab 09/08/16 0201 09/09/16 0306  WBC 6.3 4.5  NEUTROABS 2.7  --   HGB 10.6* 9.6*  HCT 31.8* 29.6*  MCV 87.6 88.1  PLT 166 152   Basic Metabolic Panel:  Recent Labs Lab 09/08/16 0201 09/08/16 0811 09/09/16 0306  NA 137 138 139  K 3.7 3.5 3.3*  CL 101 101 103  CO2 26 27 28   GLUCOSE 109* 114* 100*  BUN 24* 20 15  CREATININE 1.87* 1.64* 1.31*  CALCIUM 9.1 8.9 8.6*  MG  --  1.8  --    GFR: Estimated Creatinine Clearance: 35 mL/min (A) (by C-G formula based on SCr of 1.31 mg/dL (H)). Liver Function Tests:  Recent Labs Lab 09/08/16 0201  AST 20  ALT 12*  ALKPHOS 94  BILITOT 0.8  PROT 8.5*  ALBUMIN 3.9   No results for input(s): LIPASE, AMYLASE in the  last 168 hours. No results for input(s): AMMONIA in the last 168 hours. Coagulation Profile:  Recent Labs Lab 09/08/16 0316 09/09/16 0306  INR 3.93 2.36   Cardiac Enzymes:  Recent Labs Lab 09/08/16 0201  TROPONINI <0.03   BNP (last 3 results)  Recent Labs  04/24/16 1611 07/24/16 1016  PROBNP 669 87.0   HbA1C: No results for input(s): HGBA1C in the last 72 hours. CBG: No results for input(s): GLUCAP in the last 168 hours. Lipid Profile: No results for input(s): CHOL, HDL, LDLCALC, TRIG, CHOLHDL, LDLDIRECT in the last 72 hours. Thyroid Function Tests:  Recent Labs  09/08/16 0811  TSH 3.005   Anemia Panel: No results for input(s): VITAMINB12, FOLATE, FERRITIN, TIBC, IRON, RETICCTPCT in the last 72 hours. Sepsis Labs:  Recent Labs Lab 09/08/16 0442  LATICACIDVEN 0.92    Recent Results (from the past 240 hour(s))  Urine culture     Status: Abnormal (Preliminary result)   Collection Time: 09/08/16  4:28 AM  Result Value Ref Range Status   Specimen Description URINE, RANDOM  Final   Special Requests NONE  Final   Culture >=100,000 COLONIES/mL GRAM NEGATIVE RODS (A)  Final   Report Status PENDING  Incomplete  MRSA PCR Screening     Status: None   Collection  Time: 09/08/16 12:00 PM  Result Value Ref Range Status   MRSA by PCR NEGATIVE NEGATIVE Final    Comment:        The GeneXpert MRSA Assay (FDA approved for NASAL specimens only), is one component of a comprehensive MRSA colonization surveillance program. It is not intended to diagnose MRSA infection nor to guide or monitor treatment for MRSA infections.   Surgical pcr screen     Status: None   Collection Time: 09/09/16  5:52 AM  Result Value Ref Range Status   MRSA, PCR NEGATIVE NEGATIVE Final   Staphylococcus aureus NEGATIVE NEGATIVE Final    Comment:        The Xpert SA Assay (FDA approved for NASAL specimens in patients over 11 years of age), is one component of a comprehensive  surveillance program.  Test performance has been validated by [facility] for patients greater than or equal to 89 year old. It is not intended to diagnose infection nor to guide or monitor treatment.          Radiology Studies: Dg Chest Port 1 View  Result Date: 09/08/2016 CLINICAL DATA:  Dizziness.  Atrial fibrillation. EXAM: PORTABLE CHEST 1 VIEW COMPARISON:  11/13/2013 FINDINGS: Cardiomegaly. Pulmonary vascularity is within normal limits. No infiltrates or effusions. Calcification in the arch of the aorta. Neurostimulator wires in the thoracic spine. No acute bone abnormality. IMPRESSION:: IMPRESSION: Chronic cardiomegaly.  No acute abnormalities. Aortic atherosclerosis. Electronically Signed   By: Francene Boyers M.D.   On: 09/08/2016 08:36        Scheduled Meds: . amLODipine  5 mg Oral Daily  . ferrous sulfate  325 mg Oral BID  . gabapentin  100 mg Oral QHS  . gabapentin  300 mg Oral TID WC  . gentamicin irrigation  80 mg Irrigation To Cath  . oxybutynin  5 mg Oral Daily  . pantoprazole  40 mg Oral Q0600  . pravastatin  20 mg Oral q1800   Continuous Infusions: . sodium chloride 10 mL/hr at 09/09/16 0806  . sodium chloride    .  ceFAZolin (ANCEF) IV    . cefTRIAXone (ROCEPHIN)  IV Stopped (09/09/16 0608)     LOS: 1 day    Time spent: 50 mins  Greater than 30 mins of critical care time spent on patient, reviewing the chart, assessing the patient, reviewing cardiac strips, ordering labs and xrays, consulting cardiology, implementing life sustaining treatment.    Clint Lipps, MD [facility] Pager [phone]  If 7PM-7AM, please contact night-coverage [email or web address] Password [email or web address] 09/09/2016, 10:48 AM

## 2016-09-10 ENCOUNTER — Inpatient Hospital Stay (HOSPITAL_COMMUNITY): Payer: Medicare Other

## 2016-09-10 ENCOUNTER — Encounter (HOSPITAL_COMMUNITY): Payer: Self-pay | Admitting: Internal Medicine

## 2016-09-10 ENCOUNTER — Ambulatory Visit: Payer: Medicare HMO | Admitting: Physician Assistant

## 2016-09-10 DIAGNOSIS — R791 Abnormal coagulation profile: Secondary | ICD-10-CM

## 2016-09-10 DIAGNOSIS — N390 Urinary tract infection, site not specified: Secondary | ICD-10-CM

## 2016-09-10 DIAGNOSIS — N189 Chronic kidney disease, unspecified: Secondary | ICD-10-CM

## 2016-09-10 DIAGNOSIS — I482 Chronic atrial fibrillation: Secondary | ICD-10-CM

## 2016-09-10 DIAGNOSIS — Z950 Presence of cardiac pacemaker: Secondary | ICD-10-CM

## 2016-09-10 DIAGNOSIS — N179 Acute kidney failure, unspecified: Secondary | ICD-10-CM

## 2016-09-10 DIAGNOSIS — I455 Other specified heart block: Secondary | ICD-10-CM

## 2016-09-10 LAB — GLUCOSE, CAPILLARY
GLUCOSE-CAPILLARY: 114 mg/dL — AB (ref 65–99)
GLUCOSE-CAPILLARY: 81 mg/dL (ref 65–99)

## 2016-09-10 LAB — BASIC METABOLIC PANEL
ANION GAP: 13 (ref 5–15)
BUN: 15 mg/dL (ref 6–20)
CHLORIDE: 97 mmol/L — AB (ref 101–111)
CO2: 27 mmol/L (ref 22–32)
Calcium: 8.4 mg/dL — ABNORMAL LOW (ref 8.9–10.3)
Creatinine, Ser: 1.47 mg/dL — ABNORMAL HIGH (ref 0.44–1.00)
GFR calc Af Amer: 39 mL/min — ABNORMAL LOW (ref >60)
GFR calc non Af Amer: 33 mL/min — ABNORMAL LOW (ref >60)
Glucose, Bld: 119 mg/dL — ABNORMAL HIGH (ref 65–99)
POTASSIUM: 4 mmol/L (ref 3.5–5.1)
SODIUM: 137 mmol/L (ref 135–145)

## 2016-09-10 LAB — PROTIME-INR
INR: 1.91
PROTHROMBIN TIME: 22.1 s — AB (ref 11.4–15.2)

## 2016-09-10 LAB — URINE CULTURE: Culture: >=100000 — AB

## 2016-09-10 MED ORDER — CEPHALEXIN 500 MG PO CAPS: 500.0000 mg | ORAL_CAPSULE | Freq: Two times a day (BID) | ORAL | 0 refills | Status: DC

## 2016-09-10 MED ORDER — DILTIAZEM HCL ER COATED BEADS 180 MG PO CP24
180.0000 mg | ORAL_CAPSULE | Freq: Every day | ORAL | Status: DC
  Administered 2016-09-10: 10:00:00 180 mg via ORAL
  Filled 2016-09-10: qty 1

## 2016-09-10 MED ORDER — METOPROLOL TARTRATE 25 MG PO TABS
25.0000 mg | ORAL_TABLET | Freq: Two times a day (BID) | ORAL | Status: DC
  Administered 2016-09-10: 25 mg via ORAL
  Filled 2016-09-10: qty 1

## 2016-09-10 MED ORDER — DILTIAZEM HCL ER COATED BEADS 180 MG PO CP24: 180.0000 mg | ORAL_CAPSULE | Freq: Every day | ORAL | 0 refills | Status: DC

## 2016-09-10 NOTE — Discharge Instructions (Signed)
° ° °  Supplemental Discharge Instructions for  Pacemaker/Defibrillator Patients  Activity No heavy lifting or vigorous activity with your left/right arm for 6 to 8 weeks.  Do not raise your left/right arm above your head for one week.  Gradually raise your affected arm as drawn below.           __         09/13/16                     09/14/16                      09/15/16             09/16/16  NO DRIVING for   1 week  ; you may begin driving on    1/61/096/13/18 .  WOUND CARE - Keep the wound area clean and dry.  Do not get this area wet for one week. No showers for one week; you may shower on  09/16/16   . - The tape/steri-strips on your wound will fall off; do not pull them off.  No bandage is needed on the site.  DO  NOT apply any creams, oils, or ointments to the wound area. - If you notice any drainage or discharge from the wound, any swelling or bruising at the site, or you develop a fever > 101? F after you are discharged home, call the office at once.  Special Instructions - You are still able to use cellular telephones; use the ear opposite the side where you have your pacemaker/defibrillator.  Avoid carrying your cellular phone near your device. - When traveling through airports, show security personnel your identification card to avoid being screened in the metal detectors.  Ask the security personnel to use the hand wand. - Avoid arc welding equipment, MRI testing (magnetic resonance imaging), TENS units (transcutaneous nerve stimulators).  Call the office for questions about other devices. - Avoid electrical appliances that are in poor condition or are not properly grounded. - Microwave ovens are safe to be near or to operate.

## 2016-09-10 NOTE — Discharge Summary (Addendum)
Physician Discharge Summary  Buddy DutyBarbara P Halvorson ZOX:[id number] DOB: Aug 12, 1940 DOA: 09/08/2016  PCP: Myrlene Brokerrawford, Elizabeth A, MD  Admit date: 09/08/2016 Discharge date: 09/10/2016  Admitted From: Home Disposition: Home   Recommendations for Outpatient Follow-up:  1. Follow up with PCP in 1-2 weeks 2. Follow up with EP as scheduled for wound recheck 3. Monitor HR and BP. Stopped norvasc, restarted metoprolol, and increased diltiazem. 4. Please obtain BMP/CBC in one week  Home Health: None Equipment/Devices: None Discharge Condition: Stable CODE STATUS: Full Diet recommendation: Heart healthy  Brief/Interim Summary: Patient is a pleasant 76 year old female history of paroxysmal atrial fibrillation on chronic anticoagulation, hypertension, hyperlipidemia, CVA, chronic kidney disease stage III presented with complaints of dizziness and lightheadedness occurring over the past several months. Patient recently assessed by EP, [name], on 08/20/2016 for possible tachybradycardia syndrome at which point in time her metoprolol dose was decreased and Norvasc added for blood pressure control with hopes of improvement with the dizziness. Patient presents back to the ED with no significant changes in her dizziness and lightheadedness. In the ED patient was noted to have another episode of dizziness and lightheadedness and noted to have multiple pauses ranging from 4.02-4.80 seconds the morning of 09/08/2016. Patient's rate limiting medications discontinued. Cardiology consulted and place a pacemaker. Medications were restarted as below and the patient was discharged in stable condition.   Discharge Diagnoses:  Principal Problem:   Sinus pause: 4.02-4.80sec per telemetry 09/08/2016 Active Problems:   Permanent atrial fibrillation (HCC)   GERD (gastroesophageal reflux disease)   Essential hypertension   HLD (hyperlipidemia)   Cerebrovascular accident (CVA) due to embolism of basilar artery (HCC)   Diabetes  mellitus with complication (HCC)   Acute kidney injury superimposed on chronic kidney disease (HCC)   CKD (chronic kidney disease) stage 3, GFR 30-59 ml/min   Sinus bradycardia   Orthostatic hypotension   Dizziness   Supratherapeutic INR   Chronic diastolic CHF (congestive heart failure) (HCC)   Tachy-brady syndrome (HCC)  Multiple sinus pauses (4.02-4.80 sec)/tachybradycardia syndrome -Presented with dizziness and lightheadedness, episodic. -She was evaluated by cardiology as outpatient for tachybradycardia syndrome, metoprolol dose decreased. -Presented to the T with spells of lightheadedness associated with several pauses longest is 4.8 seconds. -Negative 2-D echo, metoprolol and Cardizem discontinued magnesium and TSH within normal limits. - Pacemaker placed, follow up appointment scheduled. Cleared by cardiology for DC.   E. coli UTI: Ceftriaxone initially provided > keflex x total 7 days based on sensitivities.   Acute kidney injury on chronic kidney disease stage III -Baseline creatinine 1.5, creatinine on admission 1.8. AKI resolved.  Chronic diastolic heart failure -2-D echo with EF of 65-70% from January 2018. Patient is compensated.   Hypertension Will stop amlodipine as adding back metoprolol and diltiazem and follow as outpatient   A. fib with supratherapeutic INR Continue Warfarin long term for CHADS2VASC of 7. Ok to resume home dosing at discharge Will add back Metoprolol and Cardizem for rate control. Can consider rhythm control strategy as an outpatient   Gastroesophageal reflux disease PPI.  History of CVA INR supratherapeutic. PT/OT.  Hyperlipidemia Check a fasting lipid panel. Continue statin.  Obesity Body mass index is 40.26 kg/m.   Discharge Instructions Discharge Instructions    Discharge instructions    Complete by:  As directed    You were admitted for symptoms due to low heart rate and have had a pacemaker implanted to help with this  problem. You are stable for discharge with the following recommendations:  -  STOP taking norvasc - CHANGE diltiazem to 180mg  daily (you were previously taking 120mg  daily) - START taking keflex 500mg  twice daily for the next 5 days to treat a UTI - Keep taking other medications as you were - Follow up as scheduled below - If your symptoms return, or you develop fever, chills, redness at the implantation site.     Allergies as of 09/10/2016      Reactions   Aspirin Hives   Hydrocodone Hives   Tolerates oxycodone and tramadol   Rofecoxib Hives      Medication List    STOP taking these medications   amLODipine 5 MG tablet Commonly known as:  NORVASC     TAKE these medications   cephALEXin 500 MG capsule Commonly known as:  KEFLEX Take 1 capsule (500 mg total) by mouth 2 (two) times daily.   diltiazem 180 MG 24 hr capsule Commonly known as:  CARDIZEM CD Take 1 capsule (180 mg total) by mouth daily. What changed:  medication strength  how much to take   ferrous sulfate 325 (65 FE) MG EC tablet TAKE ONE TABLET BY MOUTH TWICE DAILY   furosemide 80 MG tablet Commonly known as:  LASIX Take 1 tablet (80 mg total) by mouth 2 (two) times daily.   gabapentin 300 MG capsule Commonly known as:  NEURONTIN TAKE 1 CAPSULE THREE TIMES DAILY   gabapentin 100 MG capsule Commonly known as:  NEURONTIN TAKE 1 CAPSULE EVERY NIGHT AT BEDTIME   lovastatin 20 MG tablet Commonly known as:  MEVACOR TAKE 1 TABLET EVERY NIGHT AT BEDTIME   meclizine 25 MG tablet Commonly known as:  ANTIVERT Take 1 tablet (25 mg total) by mouth every 4 (four) hours as needed for dizziness.   metolazone 2.5 MG tablet Commonly known as:  ZAROXOLYN Take 1 tablet 30 minutes before lasix on Tuesday and Friday   metoprolol tartrate 25 MG tablet Commonly known as:  LOPRESSOR Take 1 tablet (25 mg total) by mouth 2 (two) times daily.   oxybutynin 5 MG tablet Commonly known as:  DITROPAN TAKE 1 TABLET EVERY  DAY   potassium chloride SA 20 MEQ tablet Commonly known as:  K-DUR,KLOR-CON Take 1 tablet (20 mEq total) by mouth as directed. Take 2 tabs (40 meq) in the AM and 1 tab (20 meq) in the PM   warfarin 5 MG tablet Commonly known as:  COUMADIN Take 1 tablet (5 mg total) by mouth daily.      Follow-up Information    [facility] Follow up on 09/22/2016.   Specialty:  Cardiology Why:  at 12noon for wound check  Contact information: [address] [place] [address] [phone]       Marinus Maw, MD Follow up on 12/11/2016.   Specialty:  Cardiology Why:  at 9:15AM Contact information: [address] [place] [address] [phone]          Allergies  Allergen Reactions  . Aspirin Hives  . Hydrocodone Hives    Tolerates oxycodone and tramadol  . Rofecoxib Hives    Consultations:  Cardiology  Procedures/Studies: Dg Chest 2 View  Result Date: 09/10/2016 CLINICAL DATA:  Status post permanent pacemaker placement. EXAM: CHEST  2 VIEW COMPARISON:  Portable chest x-ray of September 08, 2016 FINDINGS: The lungs are adequately inflated. The interstitial markings remain increased and are slightly more conspicuous especially on the right inferiorly today. The cardiac silhouette is enlarged the  pulmonary vascularity is prominent centrally. There is calcification in the wall of the aortic arch. The pacemaker generator overlies the left pectoral region and the electrodes are in reasonable position. There is no postprocedure pneumothorax. Traces of pleural fluid blunt the posterior costophrenic angles. IMPRESSION: No immediate postprocedure complication following permanent pacemaker placement. There is mild CHF. Subsegmental atelectasis at the right lung base is suspected. Electronically Signed   By: David  [place] M.D.   On: 09/10/2016 07:30   Dg Chest Port 1 View  Result Date: 09/08/2016 CLINICAL DATA:  Dizziness.  Atrial  fibrillation. EXAM: PORTABLE CHEST 1 VIEW COMPARISON:  11/13/2013 FINDINGS: Cardiomegaly. Pulmonary vascularity is within normal limits. No infiltrates or effusions. Calcification in the arch of the aorta. Neurostimulator wires in the thoracic spine. No acute bone abnormality. IMPRESSION:: IMPRESSION: Chronic cardiomegaly.  No acute abnormalities. Aortic atherosclerosis. Electronically Signed   By: Francene Boyers M.D.   On: 09/08/2016 08:36     PPM implantation 09/09/2016  Subjective: Pt feels well, no dyspnea, chest pain, fever, palpitations.   Discharge Exam: BP 127/76 (BP Location: Right Arm)   Pulse 79   Temp 98.2 F (36.8 C) (Oral)   Resp 19   Ht 5\' 7"  (1.702 m)   Wt 116.6 kg (257 lb 0.9 oz)   SpO2 95%   BMI 40.26 kg/m   General: Pt is alert, awake, not in acute distress Cardiovascular: RRR, S1/S2 +, no rubs, no gallops Respiratory: CTA bilaterally, no wheezing, no rhonchi Abdominal: Soft, NT, ND, bowel sounds + Extremities: Trace edema, no cyanosis  Labs: Basic Metabolic Panel:  Recent Labs Lab 09/08/16 0201 09/08/16 0811 09/09/16 0306 09/10/16 0318  NA 137 138 139 137  K 3.7 3.5 3.3* 4.0  CL 101 101 103 97*  CO2 26 27 28 27   GLUCOSE 109* 114* 100* 119*  BUN 24* 20 15 15   CREATININE 1.87* 1.64* 1.31* 1.47*  CALCIUM 9.1 8.9 8.6* 8.4*  MG  --  1.8  --   --    CBC:  Recent Labs Lab 09/08/16 0201 09/09/16 0306  WBC 6.3 4.5  NEUTROABS 2.7  --   HGB 10.6* 9.6*  HCT 31.8* 29.6*  MCV 87.6 88.1  PLT 166 152   Cardiac Enzymes:  Recent Labs Lab 09/08/16 0201  TROPONINI <0.03   Thyroid function studies  Recent Labs  09/08/16 0811  TSH 3.005   Urinalysis    Component Value Date/Time   COLORURINE STRAW (A) 09/08/2016 0355   APPEARANCEUR CLEAR 09/08/2016 0355   LABSPEC 1.009 09/08/2016 0355   PHURINE 5.0 09/08/2016 0355   GLUCOSEU NEGATIVE 09/08/2016 0355   HGBUR NEGATIVE 09/08/2016 0355   BILIRUBINUR NEGATIVE 09/08/2016 0355   KETONESUR NEGATIVE  09/08/2016 0355   PROTEINUR NEGATIVE 09/08/2016 0355   UROBILINOGEN 1.0 11/29/2013 0520   NITRITE NEGATIVE 09/08/2016 0355   LEUKOCYTESUR MODERATE (A) 09/08/2016 0355    Microbiology Recent Results (from the past 240 hour(s))  Urine culture     Status: Abnormal   Collection Time: 09/08/16  4:28 AM  Result Value Ref Range Status   Specimen Description URINE, RANDOM  Final   Special Requests NONE  Final   Culture >=100,000 COLONIES/mL ESCHERICHIA COLI (A)  Final   Report Status 09/10/2016 FINAL  Final   Organism ID, Bacteria ESCHERICHIA COLI (A)  Final      Susceptibility   Escherichia coli - MIC*    AMPICILLIN >=32 RESISTANT Resistant     CEFAZOLIN <=4 SENSITIVE Sensitive  CEFTRIAXONE <=1 SENSITIVE Sensitive     CIPROFLOXACIN >=4 RESISTANT Resistant     GENTAMICIN <=1 SENSITIVE Sensitive     IMIPENEM <=0.25 SENSITIVE Sensitive     NITROFURANTOIN <=16 SENSITIVE Sensitive     TRIMETH/SULFA >=320 RESISTANT Resistant     AMPICILLIN/SULBACTAM 16 INTERMEDIATE Intermediate     PIP/TAZO <=4 SENSITIVE Sensitive     Extended ESBL NEGATIVE Sensitive     * >=100,000 COLONIES/mL ESCHERICHIA COLI  MRSA PCR Screening     Status: None   Collection Time: 09/08/16 12:00 PM  Result Value Ref Range Status   MRSA by PCR NEGATIVE NEGATIVE Final    Comment:        The GeneXpert MRSA Assay (FDA approved for NASAL specimens only), is one component of a comprehensive MRSA colonization surveillance program. It is not intended to diagnose MRSA infection nor to guide or monitor treatment for MRSA infections.   Surgical pcr screen     Status: None   Collection Time: 09/09/16  5:52 AM  Result Value Ref Range Status   MRSA, PCR NEGATIVE NEGATIVE Final   Staphylococcus aureus NEGATIVE NEGATIVE Final    Comment:        The Xpert SA Assay (FDA approved for NASAL specimens in patients over 21 years of age), is one component of a comprehensive surveillance program.  Test performance has been  validated by [facility] for patients greater than or equal to 20 year old. It is not intended to diagnose infection nor to guide or monitor treatment.     Time coordinating discharge: Approximately 40 minutes  Hazeline Junker, MD  [facility] 09/10/2016, 11:22 AM Pager [phone]

## 2016-09-10 NOTE — Progress Notes (Signed)
Electrophysiology Rounding Note  Patient Name: Robin DutyBarbara P Broxterman Date of Encounter: 09/10/2016  Primary Cardiologist: Clifton JamesMcAlhany Electrophysiologist: Graciela HusbandsKlein   Subjective   The patient is doing well today.  At this time, the patient denies chest pain, shortness of breath, or any new concerns. Mild incisional soreness  Inpatient Medications    Scheduled Meds: . amLODipine  5 mg Oral Daily  . ferrous sulfate  325 mg Oral BID  . furosemide  40 mg Intravenous BID  . gabapentin  100 mg Oral QHS  . gabapentin  300 mg Oral TID WC  . Living Better with Heart Failure Book   Does not apply Once  . oxybutynin  5 mg Oral Daily  . pantoprazole  40 mg Oral Q0600  . potassium chloride SA  40 mEq Oral Daily  . pravastatin  20 mg Oral q1800   Continuous Infusions: . sodium chloride 10 mL/hr at 09/09/16 0806  .  ceFAZolin (ANCEF) IV 1 g (09/10/16 0744)  . cefTRIAXone (ROCEPHIN)  IV Stopped (09/10/16 0515)   PRN Meds: acetaminophen, atropine, ipratropium-albuterol, ondansetron **OR** ondansetron (ZOFRAN) IV   Vital Signs    Vitals:   09/09/16 2225 09/09/16 2230 09/10/16 0145 09/10/16 0333  BP: 137/71  136/71 (!) 118/59  Pulse: 81 81 (!) 109 78  Resp: 17  11 10   Temp:    98.8 F (37.1 C)  TempSrc:    Oral  SpO2: 93% 93% 92% 98%  Weight:    257 lb 0.9 oz (116.6 kg)  Height:        Intake/Output Summary (Last 24 hours) at 09/10/16 0755 Last data filed at 09/10/16 0634  Gross per 24 hour  Intake           877.75 ml  Output             4600 ml  Net         -3722.25 ml   Filed Weights   09/08/16 0152 09/09/16 1300 09/10/16 0333  Weight: 152 lb (68.9 kg) 257 lb (116.6 kg) 257 lb 0.9 oz (116.6 kg)    Physical Exam    GEN- The patient is elderly and obese appearing, alert and oriented x 3 today.   Head- normocephalic, atraumatic Eyes-  Sclera clear, conjunctiva pink Ears- hearing intact Oropharynx- clear Neck- supple Lungs- Clear to ausculation bilaterally, normal work of  breathing Heart- Irregular rate and rhythm GI- soft, NT, ND, + BS Extremities- no clubbing, cyanosis, or edema Skin- no rash or lesion Psych- euthymic mood, full affect Neuro- strength and sensation are intact  Labs    CBC  Recent Labs  09/08/16 0201 09/09/16 0306  WBC 6.3 4.5  NEUTROABS 2.7  --   HGB 10.6* 9.6*  HCT 31.8* 29.6*  MCV 87.6 88.1  PLT 166 152   Basic Metabolic Panel  Recent Labs  09/08/16 0811 09/09/16 0306 09/10/16 0318  NA 138 139 137  K 3.5 3.3* 4.0  CL 101 103 97*  CO2 27 28 27   GLUCOSE 114* 100* 119*  BUN 20 15 15   CREATININE 1.64* 1.31* 1.47*  CALCIUM 8.9 8.6* 8.4*  MG 1.8  --   --    Liver Function Tests  Recent Labs  09/08/16 0201  AST 20  ALT 12*  ALKPHOS 94  BILITOT 0.8  PROT 8.5*  ALBUMIN 3.9   Cardiac Enzymes  Recent Labs  09/08/16 0201  TROPONINI <0.03   Thyroid Function Tests  Recent Labs  09/08/16 0811  TSH  3.005    Telemetry    Atrial fib/flutter with RVR; intermittent AV pacing (personally reviewed)  Radiology    Dg Chest 2 View  Result Date: 09/10/2016 CLINICAL DATA:  Status post permanent pacemaker placement. EXAM: CHEST  2 VIEW COMPARISON:  Portable chest x-ray of September 08, 2016 FINDINGS: The lungs are adequately inflated. The interstitial markings remain increased and are slightly more conspicuous especially on the right inferiorly today. The cardiac silhouette is enlarged the pulmonary vascularity is prominent centrally. There is calcification in the wall of the aortic arch. The pacemaker generator overlies the left pectoral region and the electrodes are in reasonable position. There is no postprocedure pneumothorax. Traces of pleural fluid blunt the posterior costophrenic angles. IMPRESSION: No immediate postprocedure complication following permanent pacemaker placement. There is mild CHF. Subsegmental atelectasis at the right lung base is suspected. Electronically Signed   By: David  [place] M.D.   On:  09/10/2016 07:30    Patient Profile     Robin Snow is a 76 y.o. female with a past medical history significant for persistent atrial fibrillation, diabetes, hypertension, CKD, obesity, and pulmonary hypertension.  She was admitted for dizziness correlating with post termination pauses s/p PPM implant for tachybrady syndrome.   Assessment & Plan    1.  Tachy/brady syndrome Doing well s/p PPM implant CXR without ptx and leads in stable position Device interrogated and functioning normally - septal RV capture Routine wound care and follow up (entered in AVS)  2.  Persistent atrial fibrillation Continue Warfarin long term for CHADS2VASC of 7. Ok to resume home dosing at discharge Will add back Metoprolol and Cardizem for rate control. Can consider rhythm control strategy as an outpatient   3.  UTI Per primary team No fevers or chills   4.  HTN Stable Will stop amlodipine as adding back metoprolol and diltiazem and follow as outpatient   Ok from EP standpoint to discharge home.  Instructions/appointments entered in AVS.   Signed, Gypsy Balsam, NP  09/10/2016, 7:55 AM    EP Attending  Patient seen and examined. Agree with above. The PPM interogation under my direction demonstrates normal DDD PM function. Stable for DC home. Usual followup.   Leonia Reeves.D.

## 2016-09-11 ENCOUNTER — Ambulatory Visit: Payer: Medicare Other

## 2016-09-16 ENCOUNTER — Ambulatory Visit (INDEPENDENT_AMBULATORY_CARE_PROVIDER_SITE_OTHER): Payer: Medicare Other | Admitting: General Practice

## 2016-09-16 DIAGNOSIS — I482 Chronic atrial fibrillation: Secondary | ICD-10-CM | POA: Diagnosis not present

## 2016-09-16 DIAGNOSIS — I4821 Permanent atrial fibrillation: Secondary | ICD-10-CM

## 2016-09-16 LAB — POCT INR: INR: 3

## 2016-09-16 NOTE — Progress Notes (Signed)
I have reviewed and agree with the plan. 

## 2016-09-16 NOTE — Patient Instructions (Signed)
Pre visit review using our clinic review tool, if applicable. No additional management support is needed unless otherwise documented below in the visit note. 

## 2016-09-22 ENCOUNTER — Encounter: Payer: Self-pay | Admitting: Internal Medicine

## 2016-09-22 ENCOUNTER — Ambulatory Visit (INDEPENDENT_AMBULATORY_CARE_PROVIDER_SITE_OTHER): Payer: Medicare Other | Admitting: *Deleted

## 2016-09-22 DIAGNOSIS — I495 Sick sinus syndrome: Secondary | ICD-10-CM

## 2016-09-22 LAB — CUP PACEART INCLINIC DEVICE CHECK
Battery Remaining Longevity: 92 mo
Battery Voltage: 3.07 V
Brady Statistic RA Percent Paced: 0 %
Implantable Lead Location: 753860
Implantable Pulse Generator Implant Date: 20180606
Lead Channel Impedance Value: 450 Ohm
Lead Channel Pacing Threshold Pulse Width: 0.5 ms
Lead Channel Sensing Intrinsic Amplitude: 1.7 mV
Lead Channel Setting Pacing Amplitude: 3.5 V
Lead Channel Setting Pacing Amplitude: 3.5 V
Lead Channel Setting Pacing Pulse Width: 0.5 ms
Lead Channel Setting Sensing Sensitivity: 2 mV
MDC IDC LEAD IMPLANT DT: 20180606
MDC IDC LEAD IMPLANT DT: 20180606
MDC IDC LEAD LOCATION: 753859
MDC IDC MSMT LEADCHNL RV IMPEDANCE VALUE: 462.5 Ohm
MDC IDC MSMT LEADCHNL RV PACING THRESHOLD AMPLITUDE: 0.5 V
MDC IDC MSMT LEADCHNL RV SENSING INTR AMPL: 12 mV
MDC IDC PG SERIAL: 8912837
MDC IDC SESS DTM: 20180619135813
MDC IDC STAT BRADY RV PERCENT PACED: 52 %
Pulse Gen Model: 2272

## 2016-09-22 NOTE — Progress Notes (Signed)
Wound check appointment. Steri-strips removed. Wound without redness or edema. Incision edges approximated, wound well healed. Normal device function. Thresholds, sensing, and impedances consistent with implant measurements. Septal RV capture to loss of capture, per SJ Rep. RV pacing 52%, reprogrammed base rate to 60 bpm from 70 bpm, per SJ rep. Device programmed at 3.5V on for extra safety margin until 3 month visit. Histogram distribution appropriate for patient and level of activity. 1952 mode switches (99% burden)+Warfarin. No high ventricular rates noted. Patient educated about wound care, arm mobility, lifting restrictions. ROV with AS 11/04/16, ROV with GT 12/11/16.

## 2016-10-05 ENCOUNTER — Ambulatory Visit: Payer: Medicare Other | Admitting: Physician Assistant

## 2016-10-09 ENCOUNTER — Ambulatory Visit (HOSPITAL_BASED_OUTPATIENT_CLINIC_OR_DEPARTMENT_OTHER): Payer: Medicare Other | Attending: Physician Assistant | Admitting: Cardiology

## 2016-10-09 VITALS — Ht 67.0 in | Wt 265.0 lb

## 2016-10-09 DIAGNOSIS — G4733 Obstructive sleep apnea (adult) (pediatric): Secondary | ICD-10-CM | POA: Diagnosis not present

## 2016-10-09 DIAGNOSIS — R0683 Snoring: Secondary | ICD-10-CM | POA: Diagnosis not present

## 2016-10-09 DIAGNOSIS — I272 Pulmonary hypertension, unspecified: Secondary | ICD-10-CM | POA: Insufficient documentation

## 2016-10-12 NOTE — Procedures (Signed)
Patient Name: Robin Snow, Robin Snow Date: 10/09/2016 Gender: Female D.O.B: February 01, 1941 Age (years): 76 Referring Provider: [name] Height (inches): 67 Interpreting Physician: Armanda Magic MD, ABSM Weight (lbs): 265 RPSGT: Cherylann Parr BMI: 42 MRN: [id number] Neck Size: 16.00  CLINICAL INFORMATION Sleep Study Type: NPSG  Indication for sleep study: Congestive Heart Failure, Fatigue, Hypertension, Obesity, Snoring, Witnessed Apneas  Epworth Sleepiness Score: 4  SLEEP STUDY TECHNIQUE As per the AASM Manual for the Scoring of Sleep and Associated Events v2.3 (April 2016) with a hypopnea requiring 4% desaturations.  The channels recorded and monitored were frontal, central and occipital EEG, electrooculogram (EOG), submentalis EMG (chin), nasal and oral airflow, thoracic and abdominal wall motion, anterior tibialis EMG, snore microphone, electrocardiogram, and pulse oximetry.  MEDICATIONS Medications self-administered by patient taken the night of the study : [name]  SLEEP ARCHITECTURE The study was initiated at 10:10:25 PM and ended at 5:33:01 AM.  Sleep onset time was 5.4 minutes and the sleep efficiency was 50.5%. The total sleep time was 223.5 minutes.  Stage REM latency was 110.5 minutes.  The patient spent 9.40% of the night in stage N1 sleep, 67.56% in stage N2 sleep, 0.00% in stage N3 and 23.04% in REM.  Alpha intrusion was absent.  Supine sleep was 100.00%.  RESPIRATORY PARAMETERS The overall apnea/hypopnea index (AHI) was 16.6 per hour. There were 19 total apneas, including 19 obstructive, 0 central and 0 mixed apneas. There were 43 hypopneas and 4 RERAs.  The AHI during Stage REM sleep was 37.3 per hour.  AHI while supine was 16.6 per hour.  The mean oxygen saturation was 90.63%. The minimum SpO2 during sleep was 79.00%.  Moderate snoring was noted during this study.  CARDIAC DATA The 2 lead EKG demonstrated pacemaker generated. The mean heart rate  was 68.11 beats per minute. Other EKG findings include: PVCs and PACs.  LEG MOVEMENT DATA The total PLMS were 0 with a resulting PLMS index of 0.00. Associated arousal with leg movement index was 0.0 .  IMPRESSIONS - Moderate obstructive sleep apnea occurred during this study (AHI = 16.6/h). - No significant central sleep apnea occurred during this study (CAI = 0.0/h). - Moderate oxygen desaturation was noted during this study (Min O2 = 79.00%). - The patient snored with Moderate snoring volume. - EKG findings include PVCs. - Clinically significant periodic limb movements did not occur during sleep. No significant associated arousals.  DIAGNOSIS - Obstructive Sleep Apnea (327.23 [G47.33 ICD-10]) - Nocturnal Hypoxemia (327.26 [G47.36 ICD-10])  RECOMMENDATIONS - Therapeutic CPAP titration to determine optimal pressure required to alleviate sleep disordered breathing. - Positional therapy avoiding supine position during sleep. - Avoid alcohol, sedatives and other CNS depressants that may worsen sleep apnea and disrupt normal sleep architecture. - Sleep hygiene should be reviewed to assess factors that may improve sleep quality. - Weight management and regular exercise should be initiated or continued if appropriate.  Armanda Magic Diplomate, American Board of Sleep Medicine  ELECTRONICALLY SIGNED ON:  10/12/2016, 7:37 PM [facility] PH: [phone]   FX: [phone] ACCREDITED BY THE AMERICAN ACADEMY OF SLEEP MEDICINE

## 2016-10-13 ENCOUNTER — Telehealth: Payer: Self-pay | Admitting: *Deleted

## 2016-10-13 ENCOUNTER — Encounter: Payer: Self-pay | Admitting: *Deleted

## 2016-10-13 DIAGNOSIS — G4733 Obstructive sleep apnea (adult) (pediatric): Secondary | ICD-10-CM

## 2016-10-13 NOTE — Telephone Encounter (Signed)
-----   Message from Quintella Reichertraci R Turner, MD sent at 10/12/2016  7:40 PM EDT ----- Please let patient know that they have sleep apnea and recommend CPAP titration. Please set up titration in the sleep lab.

## 2016-10-13 NOTE — Telephone Encounter (Signed)
Informed patient of sleep study results and patient understanding was verbalized. Patient understands her CPAP titration is scheduled for Monday December 14 2016. Patient understands [facility]. Patient understands she will receive a sleep packet in a week or so. Patient understands to call if she does not receive the sleep packet in a timely manner. Patient agrees with treatment and thanked me for call

## 2016-10-14 ENCOUNTER — Ambulatory Visit (INDEPENDENT_AMBULATORY_CARE_PROVIDER_SITE_OTHER): Payer: Medicare Other | Admitting: General Practice

## 2016-10-14 DIAGNOSIS — I482 Chronic atrial fibrillation: Secondary | ICD-10-CM

## 2016-10-14 NOTE — Patient Instructions (Signed)
Pre visit review using our clinic review tool, if applicable. No additional management support is needed unless otherwise documented below in the visit note. 

## 2016-10-16 ENCOUNTER — Ambulatory Visit (INDEPENDENT_AMBULATORY_CARE_PROVIDER_SITE_OTHER): Payer: Medicare Other | Admitting: General Practice

## 2016-10-16 DIAGNOSIS — I482 Chronic atrial fibrillation: Secondary | ICD-10-CM

## 2016-10-16 DIAGNOSIS — I4821 Permanent atrial fibrillation: Secondary | ICD-10-CM

## 2016-10-16 LAB — POCT INR: INR: 3.7

## 2016-10-21 ENCOUNTER — Other Ambulatory Visit: Payer: Self-pay | Admitting: Internal Medicine

## 2016-10-31 NOTE — Progress Notes (Deleted)
Electrophysiology Office Note Date: 10/31/2016  ID:  Robin Snow, Robin Snow May 13, 1940, MRN [id number]  PCP: [name], MD Primary Cardiologist: Clifton James Electrophysiologist: [name]  CC: 6 week His bundle pacing follow up  Robin Snow is a 76 y.o. female seen today for Dr [name].  She presents today for routine electrophysiology followup.  Since last being seen in our clinic, the patient reports doing very well.  She denies chest pain, palpitations, dyspnea, PND, orthopnea, nausea, vomiting, dizziness, syncope, edema, weight gain, or early satiety.  Device History: STJ dual chamber PPM implanted 2018 for SSS and CHB (His Bundle pacing lead)   Past Medical History:  Diagnosis Date  . AKI (acute kidney injury) (HCC) 04/2016  . Anemia   . Arthritis   . Chronic atrial fibrillation (HCC)   . Chronic edema   . Chronic venous insufficiency   . CKD (chronic kidney disease), stage III   . Coagulopathy (HCC)   . Diabetes mellitus    Type 2  . Diverticulosis   . Dyslipidemia   . GERD (gastroesophageal reflux disease)   . GI bleed 2015   a. lower GI from diverticular source 2015.  [name] Hx of transfusion of packed red blood cells   . Hypertension   . Morbid obesity (HCC)   . Pulmonary hypertension (HCC)   . Pyelonephritis 04/2016  . Renal infarct (HCC)    a. 04/2016- adm with flank pain, ? pyelo vs renal infarct in setting of recent subtherapeutic INR.  [name] Sinus bradycardia   . Stroke [facility]) 2015   Past Surgical History:  Procedure Laterality Date  . CESAREAN SECTION    . COLONOSCOPY Left 01/24/2013   Procedure: COLONOSCOPY;  Surgeon: Willis Modena, MD;  Location: [facility] ENDOSCOPY;  Service: Endoscopy;  Laterality: Left;  . ESOPHAGOGASTRODUODENOSCOPY Left 01/23/2013   Procedure: ESOPHAGOGASTRODUODENOSCOPY (EGD);  Surgeon: Willis Modena, MD;  Location: [facility] ENDOSCOPY;  Service: Endoscopy;  Laterality: Left;  . EYE SURGERY Bilateral    cataract surgery  . JOINT  REPLACEMENT    . PACEMAKER IMPLANT [name] 09/09/2016   Procedure: Pacemaker Implant;  Surgeon: Marinus Maw, MD;  Location: [facility] INVASIVE CV LAB;  Service: Cardiovascular;  Laterality: [name];  . REPLACEMENT TOTAL KNEE BILATERAL    . TUBAL LIGATION      Current Outpatient Prescriptions  Medication Sig Dispense Refill  . cephALEXin (KEFLEX) 500 MG capsule Take 1 capsule (500 mg total) by mouth 2 (two) times daily. 10 capsule 0  . diltiazem (CARDIZEM CD) 180 MG 24 hr capsule Take 1 capsule (180 mg total) by mouth daily. 30 capsule 0  . ferrous sulfate 325 (65 FE) MG EC tablet TAKE ONE TABLET BY MOUTH TWICE DAILY 60 tablet 6  . furosemide (LASIX) 80 MG tablet Take 1 tablet (80 mg total) by mouth 2 (two) times daily. 60 tablet 5  . gabapentin (NEURONTIN) 100 MG capsule TAKE 1 CAPSULE EVERY NIGHT AT BEDTIME 90 capsule 3  . gabapentin (NEURONTIN) 300 MG capsule TAKE 1 CAPSULE THREE TIMES DAILY 270 capsule 3  . lovastatin (MEVACOR) 20 MG tablet TAKE 1 TABLET EVERY NIGHT AT BEDTIME 90 tablet 2  . meclizine (ANTIVERT) 25 MG tablet Take 1 tablet (25 mg total) by mouth every 4 (four) hours as needed for dizziness. 60 tablet 0  . metolazone (ZAROXOLYN) 2.5 MG tablet Take 1 tablet 30 minutes before lasix on Tuesday and Friday 30 tablet 0  . metoprolol tartrate (LOPRESSOR) 25 MG tablet Take 1 tablet (25 mg  total) by mouth 2 (two) times daily. 180 tablet 3  . oxybutynin (DITROPAN) 5 MG tablet TAKE 1 TABLET EVERY DAY 90 tablet 3  . potassium chloride SA (K-DUR,KLOR-CON) 20 MEQ tablet Take 1 tablet (20 mEq total) by mouth as directed. Take 2 tabs (40 meq) in the AM and 1 tab (20 meq) in the PM 135 tablet 3  . warfarin (COUMADIN) 5 MG tablet Take 1 tablet (5 mg total) by mouth daily. 90 tablet 3   No current facility-administered medications for this visit.     Allergies:   Aspirin; Hydrocodone; and Rofecoxib   Social History: Social History   Social History  . Marital status: Widowed    Spouse name: [name]  .  Number of children: 4  . Years of education: 12   Occupational History  . Not on file.   Social History Main Topics  . Smoking status: Former Smoker    Types: Cigarettes    Quit date: 07/07/1993  . Smokeless tobacco: Never Used     Comment: .3 cig a day  . Alcohol use No     Comment: no longer drinks alcohol  . Drug use: No  . Sexual activity: Not on file   Other Topics Concern  . Not on file   Social History Narrative   Patient is widowed and has 4 living children, and 2 deceased children.   Patient is right handed.   Patient has hs education.   Patient drinks coffee and soda once a week.    Family History: Family History  Problem Relation Age of Onset  . Cancer Father   . Stroke Maternal Aunt      Review of Systems: All other systems reviewed and are otherwise negative except as noted above.   Physical Exam: VS:  There were no vitals taken for this visit. , BMI There is no height or weight on file to calculate BMI.  GEN- The patient is well appearing, alert and oriented x 3 today.   HEENT: normocephalic, atraumatic; sclera clear, conjunctiva pink; hearing intact; oropharynx clear; neck supple, no JVP Lymph- no cervical lymphadenopathy Lungs- Clear to ausculation bilaterally, normal work of breathing.  No wheezes, rales, rhonchi Heart- Regular rate and rhythm, no murmurs, rubs or gallops, PMI not laterally displaced GI- soft, non-tender, non-distended, bowel sounds present, no hepatosplenomegaly Extremities- no clubbing, cyanosis, or edema; DP/PT/radial pulses 2+ bilaterally MS- no significant deformity or atrophy Skin- warm and dry, no rash or lesion; PPM pocket well healed Psych- euthymic mood, full affect Neuro- strength and sensation are intact  PPM Interrogation- reviewed in detail today,  See PACEART report  EKG:  EKG is ordered today. The ekg ordered today shows ***  Recent Labs: 07/24/2016: Pro B Natriuretic peptide (BNP) 87.0 09/08/2016: ALT 12;  Magnesium 1.8; TSH 3.005 09/09/2016: Hemoglobin 9.6; Platelets 152 09/10/2016: BUN 15; Creatinine, Ser 1.47; Potassium 4.0; Sodium 137   Wt Readings from Last 3 Encounters:  10/09/16 265 lb (120.2 kg)  09/10/16 257 lb 0.9 oz (116.6 kg)  08/20/16 259 lb (117.5 kg)     Other studies Reviewed: Additional studies/ records that were reviewed today include: hospital records, Dr Odessa FlemingKlein's office notes  Assessment and Plan:  1.  SSS/CHB Normal PPM function See Arita MissPace Art report No changes today His Bundle capture ***  2.  Paroxysmal atrial fibrillation Burden by device interrogation ***% V rates *** Continue Warfarin for CHADS2VASC of 7  3.  HTN Stable No change required today  Current medicines are reviewed at length with the patient today.   The patient does not have concerns regarding her medicines.  The following changes were made today:  none  Labs/ tests ordered today include: none No orders of the defined types were placed in this encounter.    Disposition:   Follow up with Dr Ladona Ridgelaylor as scheduled    Signed, Gypsy BalsamAmber Seiler, NP 10/31/2016 9:20 AM  [facility] [address] [place] [address] [phone] (office) [phone] (fax

## 2016-11-04 ENCOUNTER — Encounter: Payer: Medicare Other | Admitting: Nurse Practitioner

## 2016-11-05 ENCOUNTER — Encounter: Payer: Self-pay | Admitting: Nurse Practitioner

## 2016-11-12 NOTE — Patient Instructions (Signed)
Pre visit review using our clinic review tool, if applicable. No additional management support is needed unless otherwise documented below in the visit note. 

## 2016-11-13 ENCOUNTER — Ambulatory Visit (INDEPENDENT_AMBULATORY_CARE_PROVIDER_SITE_OTHER): Payer: Medicare Other | Admitting: General Practice

## 2016-11-13 DIAGNOSIS — I482 Chronic atrial fibrillation: Secondary | ICD-10-CM | POA: Diagnosis not present

## 2016-11-13 DIAGNOSIS — I4821 Permanent atrial fibrillation: Secondary | ICD-10-CM

## 2016-11-13 LAB — POCT INR: INR: 2.5

## 2016-11-23 ENCOUNTER — Ambulatory Visit (INDEPENDENT_AMBULATORY_CARE_PROVIDER_SITE_OTHER): Payer: Medicare Other | Admitting: *Deleted

## 2016-11-23 DIAGNOSIS — I495 Sick sinus syndrome: Secondary | ICD-10-CM | POA: Diagnosis not present

## 2016-11-24 NOTE — Progress Notes (Signed)
Remote pacemaker transmission.   

## 2016-11-26 LAB — CUP PACEART REMOTE DEVICE CHECK
Battery Remaining Longevity: 83 mo
Battery Remaining Percentage: 95.5 %
Brady Statistic AP VS Percent: 1 %
Brady Statistic AS VP Percent: 29 %
Brady Statistic RA Percent Paced: 1 %
Brady Statistic RV Percent Paced: 30 %
Implantable Lead Implant Date: 20180606
Implantable Lead Location: 753860
Implantable Lead Model: 3830
Lead Channel Impedance Value: 440 Ohm
Lead Channel Pacing Threshold Amplitude: 5.25 V
Lead Channel Pacing Threshold Pulse Width: 0.5 ms
Lead Channel Sensing Intrinsic Amplitude: 1.1 mV
Lead Channel Setting Pacing Amplitude: 3.5 V
Lead Channel Setting Pacing Pulse Width: 0.5 ms
Lead Channel Setting Sensing Sensitivity: 2 mV
MDC IDC LEAD IMPLANT DT: 20180606
MDC IDC LEAD LOCATION: 753859
MDC IDC MSMT BATTERY VOLTAGE: 3.01 V
MDC IDC MSMT LEADCHNL RA PACING THRESHOLD AMPLITUDE: 0.75 V
MDC IDC MSMT LEADCHNL RA PACING THRESHOLD PULSEWIDTH: 0.5 ms
MDC IDC MSMT LEADCHNL RV IMPEDANCE VALUE: 430 Ohm
MDC IDC MSMT LEADCHNL RV SENSING INTR AMPL: 12 mV
MDC IDC PG IMPLANT DT: 20180606
MDC IDC PG SERIAL: 8912837
MDC IDC SESS DTM: 20180820002421
MDC IDC SET LEADCHNL RA PACING AMPLITUDE: 3.5 V
MDC IDC STAT BRADY AP VP PERCENT: 1 %
MDC IDC STAT BRADY AS VS PERCENT: 33 %

## 2016-12-04 ENCOUNTER — Encounter: Payer: Self-pay | Admitting: Cardiology

## 2016-12-10 ENCOUNTER — Telehealth: Payer: Self-pay | Admitting: Internal Medicine

## 2016-12-10 ENCOUNTER — Encounter: Payer: Self-pay | Admitting: Internal Medicine

## 2016-12-10 ENCOUNTER — Ambulatory Visit (INDEPENDENT_AMBULATORY_CARE_PROVIDER_SITE_OTHER): Payer: Medicare Other | Admitting: Internal Medicine

## 2016-12-10 DIAGNOSIS — I872 Venous insufficiency (chronic) (peripheral): Secondary | ICD-10-CM

## 2016-12-10 MED ORDER — TORSEMIDE 20 MG PO TABS: 20.0000 mg | ORAL_TABLET | Freq: Two times a day (BID) | ORAL | 5 refills | Status: DC

## 2016-12-10 NOTE — Assessment & Plan Note (Signed)
She is not taking lasix or metolazone. Will switch to torsemide to see if this helps. She does refuse compression stockings. Talked about propping. She does have a reasonably high sodium diet and we talked again about more sodium causes more fluid retention.

## 2016-12-10 NOTE — Patient Instructions (Signed)
We have sent in a medicine called torsemide that will replace the lasix.   Take 1 pill of the torsemide in the morning and 1 pill in the afternoon to help with fluid.

## 2016-12-10 NOTE — Telephone Encounter (Signed)
Resent to correct pharmacy.../lmb 

## 2016-12-10 NOTE — Patient Instructions (Signed)
Pre visit review using our clinic review tool, if applicable. No additional management support is needed unless otherwise documented below in the visit note. 

## 2016-12-10 NOTE — Progress Notes (Signed)
   Subjective:    Patient ID: Robin Snow, female    DOB: May 09, 1940, 76 y.o.   MRN: [id number]  HPI The patient is a 76 YO female coming in for swelling in her legs. She has stopped taking her lasix and metolazone some time ago as she felt that they were not working. She is still eating bacon most days for breakfast and deli meats for lunch if she eats lunch. She is trying not to add salt to foods and decrease salt. She does prop feet up and this helps some. They go down overnight and increase throughout the day. They are painful and limit her ambulation some. They have been more swollen for the last month or so. Denies SOB or chest pains or palpitations.   Review of Systems  Constitutional: Positive for activity change. Negative for appetite change, chills, fatigue, fever and unexpected weight change.  Respiratory: Negative.   Cardiovascular: Positive for leg swelling. Negative for chest pain and palpitations.  Gastrointestinal: Negative.   Endocrine: Negative.   Musculoskeletal: Positive for arthralgias, gait problem and joint swelling. Negative for back pain, myalgias, neck pain and neck stiffness.  Skin: Negative.   Neurological: Positive for weakness. Negative for dizziness, facial asymmetry, light-headedness and headaches.       Chronic and stable.   Hematological: Negative.       Objective:   Physical Exam  Constitutional: She is oriented to person, place, and time. She appears well-developed and well-nourished.  Overweight  HENT:  Head: Normocephalic and atraumatic.  Eyes: EOM are normal.  Neck: Normal range of motion.  Cardiovascular: Normal rate.   Pulmonary/Chest: Effort normal and breath sounds normal. No respiratory distress. She has no wheezes. She has no rales.  Abdominal: Soft. Bowel sounds are normal. She exhibits no distension. There is no tenderness. There is no rebound.  Musculoskeletal: She exhibits edema.  2+ pitting edema to midshin bilaterally. Some skin  color change and thickness from chronic edema.   Neurological: She is alert and oriented to person, place, and time. Coordination abnormal.  Skin: Skin is warm and dry.   Vitals:   12/10/16 1033  BP: 128/88  Pulse: (!) 101  Temp: 97.8 F (36.6 C)  TempSrc: Oral  SpO2: 99%  Weight: 254 lb (115.2 kg)  Height: 5\' 7"  (1.702 m)      Assessment & Plan:

## 2016-12-10 NOTE — Telephone Encounter (Signed)
Pts torsemide (DEMADEX) 20 MG tablet Was sent to wrong pharmacy please send to [facility] [address]

## 2016-12-11 ENCOUNTER — Encounter: Payer: Self-pay | Admitting: Internal Medicine

## 2016-12-11 ENCOUNTER — Ambulatory Visit (INDEPENDENT_AMBULATORY_CARE_PROVIDER_SITE_OTHER): Payer: Medicare Other | Admitting: Internal Medicine

## 2016-12-11 ENCOUNTER — Ambulatory Visit (INDEPENDENT_AMBULATORY_CARE_PROVIDER_SITE_OTHER): Payer: Medicare Other | Admitting: General Practice

## 2016-12-11 VITALS — BP 158/68 | HR 83 | Ht 67.0 in | Wt 254.0 lb

## 2016-12-11 DIAGNOSIS — I495 Sick sinus syndrome: Secondary | ICD-10-CM

## 2016-12-11 DIAGNOSIS — R001 Bradycardia, unspecified: Secondary | ICD-10-CM | POA: Diagnosis not present

## 2016-12-11 DIAGNOSIS — I482 Chronic atrial fibrillation: Secondary | ICD-10-CM

## 2016-12-11 DIAGNOSIS — I4821 Permanent atrial fibrillation: Secondary | ICD-10-CM

## 2016-12-11 NOTE — Progress Notes (Signed)
HPI Mrs. Strawder returns today for follow-up. She is a pleasant 76 year old woman with paroxysmal atrial fibrillation and long posttermination pauses, who underwent permanent pacemaker insertion several months ago. In the interim, she has done well. She denies syncope or dizzy spells. She denies chest pain or shortness of breath. She admits to some dietary indiscretion with sodium and has had peripheral edema. Her medications were adjusted at her primary doctor's office yesterday. Allergies  Allergen Reactions  . Aspirin Hives  . Hydrocodone Hives    Tolerates oxycodone and tramadol  . Rofecoxib Hives     Current Outpatient Prescriptions  Medication Sig Dispense Refill  . diltiazem (CARDIZEM CD) 180 MG 24 hr capsule Take 1 capsule (180 mg total) by mouth daily. 30 capsule 0  . ferrous sulfate 325 (65 FE) MG EC tablet TAKE ONE TABLET BY MOUTH TWICE DAILY 60 tablet 6  . gabapentin (NEURONTIN) 100 MG capsule TAKE 1 CAPSULE EVERY NIGHT AT BEDTIME 90 capsule 3  . gabapentin (NEURONTIN) 300 MG capsule TAKE 1 CAPSULE THREE TIMES DAILY 270 capsule 3  . lovastatin (MEVACOR) 20 MG tablet TAKE 1 TABLET EVERY NIGHT AT BEDTIME 90 tablet 2  . meclizine (ANTIVERT) 25 MG tablet Take 1 tablet (25 mg total) by mouth every 4 (four) hours as needed for dizziness. 60 tablet 0  . metolazone (ZAROXOLYN) 2.5 MG tablet Take 1 tablet 30 minutes before lasix on Tuesday and Friday 30 tablet 0  . metoprolol tartrate (LOPRESSOR) 25 MG tablet Take 1 tablet (25 mg total) by mouth 2 (two) times daily. 180 tablet 3  . oxybutynin (DITROPAN) 5 MG tablet TAKE 1 TABLET EVERY DAY 90 tablet 3  . potassium chloride SA (K-DUR,KLOR-CON) 20 MEQ tablet Take 1 tablet (20 mEq total) by mouth as directed. Take 2 tabs (40 meq) in the AM and 1 tab (20 meq) in the PM 135 tablet 3  . torsemide (DEMADEX) 20 MG tablet Take 1 tablet (20 mg total) by mouth 2 (two) times daily. 60 tablet 5  . warfarin (COUMADIN) 5 MG tablet Take 1 tablet  (5 mg total) by mouth daily. 90 tablet 3   No current facility-administered medications for this visit.      Past Medical History:  Diagnosis Date  . AKI (acute kidney injury) (HCC) 04/2016  . Anemia   . Arthritis   . Chronic atrial fibrillation (HCC)   . Chronic edema   . Chronic venous insufficiency   . CKD (chronic kidney disease), stage III   . Coagulopathy (HCC)   . Diabetes mellitus    Type 2  . Diverticulosis   . Dyslipidemia   . GERD (gastroesophageal reflux disease)   . GI bleed 2015   a. lower GI from diverticular source 2015.  [name] Hx of transfusion of packed red blood cells   . Hypertension   . Morbid obesity (HCC)   . Pulmonary hypertension (HCC)   . Pyelonephritis 04/2016  . Renal infarct (HCC)    a. 04/2016- adm with flank pain, ? pyelo vs renal infarct in setting of recent subtherapeutic INR.  [name] Sinus bradycardia   . Stroke (HCC) 2015    ROS:   All systems reviewed and negative except as noted in the HPI.   Past Surgical History:  Procedure Laterality Date  . CESAREAN SECTION    . COLONOSCOPY Left 01/24/2013   Procedure: COLONOSCOPY;  Surgeon: Willis Modena, MD;  Location: [facility] ENDOSCOPY;  Service: Endoscopy;  Laterality: Left;  . ESOPHAGOGASTRODUODENOSCOPY  Left 01/23/2013   Procedure: ESOPHAGOGASTRODUODENOSCOPY (EGD);  Surgeon: Willis ModenaWilliam Outlaw, MD;  [facility];  Service: Endoscopy;  Laterality: Left;  . EYE SURGERY Bilateral    cataract surgery  . JOINT REPLACEMENT    . PACEMAKER IMPLANT [name] 09/09/2016   Procedure: Pacemaker Implant;  Surgeon: Marinus Mawaylor, Gregg W, MD;  [facility];  Service: Cardiovascular;  Laterality: [name];  . REPLACEMENT TOTAL KNEE BILATERAL    . TUBAL LIGATION       Family History  Problem Relation Age of Onset  . Cancer Father   . Stroke Maternal Aunt      Social History   Social History  . Marital status: Widowed    Spouse name: [name]  . Number of children: 4  . Years of education: 12    Occupational History  . Not on file.   Social History Main Topics  . Smoking status: Former Smoker    Types: Cigarettes    Quit date: 07/07/1993  . Smokeless tobacco: Never Used     Comment: .3 cig a day  . Alcohol use No     Comment: no longer drinks alcohol  . Drug use: No  . Sexual activity: Not on file   Other Topics Concern  . Not on file   Social History Narrative   Patient is widowed and has 4 living children, and 2 deceased children.   Patient is right handed.   Patient has hs education.   Patient drinks coffee and soda once a week.     BP (!) 158/68   Pulse 83   Ht 5\' 7"  (1.702 m)   Wt 254 lb (115.2 kg)   BMI 39.78 kg/m   Physical Exam:  76 year old obese appearing woman, NAD HEENT: Unremarkable Neck:  6 cm JVD, no thyromegally Lymphatics:  No adenopathy Back:  No CVA tenderness Lungs:  Clear, with rare basilar rales. No wheezes, no rhonchi. No increased work of breathing. HEART:  IRegular rate rhythm, no murmurs, no rubs, no clicks Abd:  soft, positive bowel sounds, no organomegally, no rebound, no guarding Ext:  2 plus pulses, 2+ peripheral edema, no cyanosis, no clubbing Skin:  No rashes no nodules Neuro:  CN II through XII intact, motor grossly intact  EKG - atrial fibrillation with a controlled ventricular response  DEVICE  Normal device function.  See PaceArt for details. Ventricular pacing bipolar has an elevated pacing threshold. Unipolar pacing has a normal threshold.  Assess/Plan: 1. Chronic atrial fibrillation - the patient appears to have been out of rhythm since her pacemaker was placed. We discussed rhythm control versus rate control. Because she has been stable, we'll continue a strategy of rate control. 2. Pacemaker - her St. Jude dual-chamber pacemaker is working normally. We have changed her from dual-chamber pacing to ventricular pacing and we have changed her pacing from bipolar to unipolar. 3. Hypertension - her blood pressure is  elevated today. She is encouraged to reduce her salt intake. She is on multiple medications, and she will continue these. She is encouraged to lose weight. 4. Obesity - weight loss is recommended.  Lewayne BuntingGregg Taylor, M.D.

## 2016-12-11 NOTE — Patient Instructions (Addendum)
Medication Instructions:  Your physician recommends that you continue on your current medications as directed. Please refer to the Current Medication list given to you today.  Labwork: None ordered.  Testing/Procedures: None ordered.  Follow-Up: Your physician wants you to follow-up in: 9 months with Dr. Ladona Ridgelaylor.   You will receive a reminder letter in the mail two months in advance. If you don't receive a letter, please call our office to schedule the follow-up appointment.  Remote monitoring is used to monitor your Pacemaker from home. This monitoring reduces the number of office visits required to check your device to one time per year. It allows us to keep an eye on the functioning of your device to ensure it is working properly. You are scheduled for a device check from home on 02/22/2017. You may send your transmission at any time that day. If you have a wireless device, the transmission will be sent automatically. After your physician reviews your transmission, you will receive a postcard with your next transmission date.   Any Other Special Instructions Will Be Listed Below (If Applicable).    If you need a refill on your cardiac medications before your next appointment, please call your pharmacy.

## 2016-12-14 ENCOUNTER — Ambulatory Visit (HOSPITAL_BASED_OUTPATIENT_CLINIC_OR_DEPARTMENT_OTHER): Payer: Medicare Other | Attending: Cardiology | Admitting: Cardiology

## 2016-12-14 DIAGNOSIS — G4730 Sleep apnea, unspecified: Secondary | ICD-10-CM | POA: Diagnosis present

## 2016-12-14 DIAGNOSIS — G4733 Obstructive sleep apnea (adult) (pediatric): Secondary | ICD-10-CM | POA: Insufficient documentation

## 2016-12-14 DIAGNOSIS — I493 Ventricular premature depolarization: Secondary | ICD-10-CM | POA: Insufficient documentation

## 2016-12-14 DIAGNOSIS — R0683 Snoring: Secondary | ICD-10-CM | POA: Diagnosis not present

## 2016-12-14 DIAGNOSIS — Z950 Presence of cardiac pacemaker: Secondary | ICD-10-CM | POA: Diagnosis not present

## 2016-12-15 NOTE — Procedures (Signed)
   Patient Name: Robin Snow, Robin Snow Study Date: 12/14/2016 Gender: Female D.O.B: April 26, 1940 Age (years): [age] Referring Provider: Armanda Magicraci Turner MD, ABSM Height (inches): 67 Interpreting Physician: Armanda Magicraci Turner MD, ABSM Weight (lbs): 254 RPSGT: Alfonso EllisHedrick, Debra BMI: 40 MRN: [id number] Neck Size: 16.00  CLINICAL INFORMATION The patient is referred for a CPAP titration to treat sleep apnea.  SLEEP STUDY TECHNIQUE As per the AASM Manual for the Scoring of Sleep and Associated Events v2.3 (April 2016) with a hypopnea requiring 4% desaturations.  The channels recorded and monitored were frontal, central and occipital EEG, electrooculogram (EOG), submentalis EMG (chin), nasal and oral airflow, thoracic and abdominal wall motion, anterior tibialis EMG, snore microphone, electrocardiogram, and pulse oximetry. Continuous positive airway pressure (CPAP) was initiated at the beginning of the study and titrated to treat sleep-disordered breathing.  MEDICATIONS Medications self-administered by patient taken the night of the study : [name]  TECHNICIAN COMMENTS Comments added by technician: Patient tolerated CPAP very well. Titration started at 5 cm H20 and increased gradually to 8 cm of H20 due to central-like events throughout testing. A EPR of 3 was initiated due to the patient initially having difficulties with CPAP pressure. Final Suboptimal pressure of 9 cm of H20 with Flex setting of 1. Paced EKG  Comments added by scorer: [name]  RESPIRATORY PARAMETERS Optimal PAP Pressure (cm): 9  AHI at Optimal Pressure (/hr):4.7 Overall Minimal O2 (%):90.00  Supine % at Optimal Pressure (%):[name] Minimal O2 at Optimal Pressure (%): 95.00    SLEEP ARCHITECTURE The study was initiated at 11:15:57 PM and ended at 5:31:16 AM.  Sleep onset time was 17.2 minutes and the sleep efficiency was 69.2%. The total sleep time was 259.6 minutes.  The patient spent 5.81% of the night in stage N1 sleep, 76.86% in stage N2  sleep, 6.74% in stage N3 and 10.59% in REM.Stage REM latency was 84.5 minutes  Wake after sleep onset was 98.5. Alpha intrusion was absent. Supine sleep was 100.00%.  CARDIAC DATA The 2 lead EKG demonstrated sinus rhythm, pacemaker generated. The mean heart rate was [name] beats per minute. Other EKG findings include: PVCs.  LEG MOVEMENT DATA The total Periodic Limb Movements of Sleep (PLMS) were 0. The PLMS index was 0.00. A PLMS index of <15 is considered normal in adults.  IMPRESSIONS - An optimal PAP pressure of 9cm H2O was selected for this patient based on the available study data. - Mild Central Sleep Apnea was noted during this titration (CAI = 9.5/h). - Moderate oxygen desaturations were observed during this titration (min O2 = 90.00%). - The patient snored with Moderate snoring volume during this titration study. - 2-lead EKG demonstrated: PVCs - Clinically significant periodic limb movements were not noted during this study. Arousals associated with PLMs were rare.  DIAGNOSIS - Obstructive Sleep Apnea (327.23 [G47.33 ICD-10])  RECOMMENDATIONS - Recommend ResMed CPAP at 9cm H2O with heated humidity and ResMed Airfit P20 mask with chin strap.  - Avoid alcohol, sedatives and other CNS depressants that may worsen sleep apnea and disrupt normal sleep architecture. - Sleep hygiene should be reviewed to assess factors that may improve sleep quality. - Weight management and regular exercise should be initiated or continued.  Armanda Magicraci Turner Diplomate, American Board of Sleep Medicine  ELECTRONICALLY SIGNED ON:  12/15/2016, 1:03 PM [facility] PH: [phone]   FX: [phone] ACCREDITED BY THE AMERICAN ACADEMY OF SLEEP MEDICINE

## 2016-12-16 ENCOUNTER — Telehealth: Payer: Self-pay | Admitting: *Deleted

## 2016-12-16 NOTE — Telephone Encounter (Signed)
Informed patient of titration results and verbalized understanding was indicated. Patient understands her titration was successful. Patient understands Dr Mayford Knifeturner has ordered her a CPAP in EPIC. Patient [facility]. She understands to [facility] with new setup in a timely manner. She understands she will be called once [facility] her new machine to schedule 10 week follow up appointment.  [facility] notified of new cpap order in epic Please add to [facility] She was grateful for the call and thanked me

## 2016-12-16 NOTE — Telephone Encounter (Signed)
-----   Message from Quintella Reichertraci R Turner, MD sent at 12/15/2016  1:18 PM EDT ----- Please let patient know that they had a successful PAP titration and let DME know that orders are in EPIC.  Please set up 10 week OV with me.

## 2016-12-18 ENCOUNTER — Ambulatory Visit: Payer: Medicare Other

## 2016-12-25 ENCOUNTER — Ambulatory Visit (INDEPENDENT_AMBULATORY_CARE_PROVIDER_SITE_OTHER): Payer: Medicare Other | Admitting: General Practice

## 2016-12-25 DIAGNOSIS — I4821 Permanent atrial fibrillation: Secondary | ICD-10-CM

## 2016-12-25 DIAGNOSIS — Z7901 Long term (current) use of anticoagulants: Secondary | ICD-10-CM

## 2016-12-25 DIAGNOSIS — I482 Chronic atrial fibrillation: Secondary | ICD-10-CM

## 2016-12-25 LAB — POCT INR: INR: 3.6

## 2016-12-25 NOTE — Progress Notes (Signed)
I have reviewed and agree with the plan. 

## 2016-12-25 NOTE — Patient Instructions (Signed)
Pre visit review using our clinic review tool, if applicable. No additional management support is needed unless otherwise documented below in the visit note. 

## 2016-12-28 DIAGNOSIS — G4733 Obstructive sleep apnea (adult) (pediatric): Secondary | ICD-10-CM | POA: Diagnosis not present

## 2016-12-28 LAB — CUP PACEART INCLINIC DEVICE CHECK
Brady Statistic RA Percent Paced: 0.06 %
Brady Statistic RV Percent Paced: 31 %
Date Time Interrogation Session: 20180907132006
Implantable Lead Implant Date: 20180606
Implantable Lead Location: 753859
Implantable Lead Location: 753860
Implantable Lead Model: 3830
Implantable Pulse Generator Implant Date: 20180606
Lead Channel Impedance Value: 337.5 Ohm
Lead Channel Impedance Value: 450 Ohm
Lead Channel Pacing Threshold Amplitude: 0.75 V
Lead Channel Sensing Intrinsic Amplitude: 12 mV
Lead Channel Setting Pacing Amplitude: 2.5 V
MDC IDC LEAD IMPLANT DT: 20180606
MDC IDC MSMT BATTERY REMAINING LONGEVITY: 123 mo
MDC IDC MSMT BATTERY VOLTAGE: 3.01 V
MDC IDC MSMT LEADCHNL RA SENSING INTR AMPL: 1.4 mV
MDC IDC MSMT LEADCHNL RV PACING THRESHOLD PULSEWIDTH: 0.5 ms
MDC IDC SET LEADCHNL RA PACING AMPLITUDE: 2 V
MDC IDC SET LEADCHNL RV PACING PULSEWIDTH: 0.5 ms
MDC IDC SET LEADCHNL RV SENSING SENSITIVITY: 2 mV
Pulse Gen Serial Number: 8912837

## 2016-12-30 NOTE — Telephone Encounter (Signed)
Patient has a 10 week follow up appointment scheduled for February 24 2017. Patient understands she needs to keep this appointment for insurance compliance. Patient was grateful for the call and thanked me.

## 2017-01-05 NOTE — Progress Notes (Signed)
Pre visit review using our clinic review tool, if applicable. No additional management support is needed unless otherwise documented below in the visit note. 

## 2017-01-05 NOTE — Progress Notes (Signed)
Subjective:   Robin Snow is a 76 y.o. female who presents for Medicare Annual (Subsequent) preventive examination.  Review of Systems:  No ROS.  Medicare Wellness Visit. Additional risk factors are reflected in the social history.  Cardiac Risk Factors include: advanced age (>12men, >19 women);diabetes mellitus;dyslipidemia;hypertension;obesity (BMI >30kg/m2);sedentary lifestyle  Sleep patterns: feels rested on waking, gets up 1-2 times nightly to void and sleeps 7-8 hours nightly.    Home Safety/Smoke Alarms: Feels safe in home. Smoke alarms in place.  Living environment; residence and Firearm Safety: 1-story house/ trailer, equipment: Walkers, Type: Educational psychologist, Type: Tub Dentist, beside commode, no firearms, Lives with family, good support system Seat Belt Safety/Bike Helmet: Wears seat belt.     Objective:     Vitals: BP (!) 142/82   Pulse (!) 101   Resp 20   Ht  (1.702 m)   Wt 254 lb (115.2 kg)   SpO2 99%   BMI 39.78 kg/m   Body mass index is 39.78 kg/m.   Tobacco History  Smoking Status  . Former Smoker  . Types: Cigarettes  . Quit date: 07/07/1993  Smokeless Tobacco  . Never Used    Comment: .3 cig a day     Counseling given: Not Answered   Past Medical History:  Diagnosis Date  . AKI (acute kidney injury) (HCC) 04/2016  . Anemia   . Arthritis   . Chronic atrial fibrillation (HCC)   . Chronic edema   . Chronic venous insufficiency   . CKD (chronic kidney disease), stage III (HCC)   . Coagulopathy (HCC)   . Diabetes mellitus    Type 2  . Diverticulosis   . Dyslipidemia   . GERD (gastroesophageal reflux disease)   . GI bleed 2015   a. lower GI from diverticular source 2015.  [name] Hx of transfusion of packed red blood cells   . Hypertension   . Morbid obesity (HCC)   . Pulmonary hypertension (HCC)   . Pyelonephritis 04/2016  . Renal infarct (HCC)    a. 04/2016- adm with flank pain, ? pyelo vs renal infarct in  setting of recent subtherapeutic INR.  [name] Sinus bradycardia   . Stroke [facility]) 2015   Past Surgical History:  Procedure Laterality Date  . CESAREAN SECTION    . COLONOSCOPY Left 01/24/2013   Procedure: COLONOSCOPY;  Surgeon: Willis Modena, MD;  Location: [facility] ENDOSCOPY;  Service: Endoscopy;  Laterality: Left;  . ESOPHAGOGASTRODUODENOSCOPY Left 01/23/2013   Procedure: ESOPHAGOGASTRODUODENOSCOPY (EGD);  Surgeon: Willis Modena, MD;  Location: [facility] ENDOSCOPY;  Service: Endoscopy;  Laterality: Left;  . EYE SURGERY Bilateral    cataract surgery  . JOINT REPLACEMENT    . PACEMAKER IMPLANT [name] 09/09/2016   Procedure: Pacemaker Implant;  Surgeon: Marinus Maw, MD;  Location: [facility] INVASIVE CV LAB;  Service: Cardiovascular;  Laterality: [name];  . REPLACEMENT TOTAL KNEE BILATERAL    . TUBAL LIGATION     Family History  Problem Relation Age of Onset  . Cancer Father   . Stroke Maternal Aunt    History  Sexual Activity  . Sexual activity: Not on file    Outpatient Encounter Prescriptions as of 01/06/2017  Medication Sig  . diltiazem (CARDIZEM CD) 180 MG 24 hr capsule Take 1 capsule (180 mg total) by mouth daily.  . ferrous sulfate 325 (65 FE) MG EC tablet TAKE ONE TABLET BY MOUTH TWICE DAILY  . gabapentin (NEURONTIN) 100 MG capsule TAKE 1 CAPSULE EVERY NIGHT  AT BEDTIME  . gabapentin (NEURONTIN) 300 MG capsule TAKE 1 CAPSULE THREE TIMES DAILY  . lovastatin (MEVACOR) 20 MG tablet TAKE 1 TABLET EVERY NIGHT AT BEDTIME  . meclizine (ANTIVERT) 25 MG tablet Take 1 tablet (25 mg total) by mouth every 4 (four) hours as needed for dizziness.  . metolazone (ZAROXOLYN) 2.5 MG tablet Take 1 tablet 30 minutes before lasix on Tuesday and Friday  . oxybutynin (DITROPAN) 5 MG tablet TAKE 1 TABLET EVERY DAY  . potassium chloride SA (K-DUR,KLOR-CON) 20 MEQ tablet Take 1 tablet (20 mEq total) by mouth as directed. Take 2 tabs (40 meq) in the AM and 1 tab (20 meq) in the PM  . torsemide (DEMADEX) 20 MG tablet Take 1  tablet (20 mg total) by mouth 2 (two) times daily.  [name] warfarin (COUMADIN) 5 MG tablet Take 1 tablet (5 mg total) by mouth daily.  . metoprolol tartrate (LOPRESSOR) 25 MG tablet Take 1 tablet (25 mg total) by mouth 2 (two) times daily.   No facility-administered encounter medications on file as of 01/06/2017.     Activities of Daily Living In your present state of health, do you have any difficulty performing the following activities: 01/06/2017 09/08/2016  Hearing? N N  Vision? N N  Difficulty concentrating or making decisions? N N  Walking or climbing stairs? Y N  Dressing or bathing? N N  Doing errands, shopping? Y N  Preparing Food and eating ? Y -  Using the Toilet? N -  In the past six months, have you accidently leaked urine? N -  Do you have problems with loss of bowel control? N -  Managing your Medications? N -  Managing your Finances? N -  Housekeeping or managing your Housekeeping? Y -  Some recent data might be hidden    Patient Care Team: [name], MD as PCP - General (Internal Medicine)    Assessment:    Physical assessment deferred to PCP.  Exercise Activities and Dietary recommendations Current Exercise Habits: The patient does not participate in regular exercise at present (Chair exercise pamphlet provided), Exercise limited by: orthopedic condition(s)  Diet (meal preparation, eat out, water intake, caffeinated beverages, dairy products, fruits and vegetables): in general, a "healthy" diet  , well balanced, eats a variety of fruits and vegetables daily, limits salt, fat/cholesterol, sugar, caffeine, drinks 6-8 glasses of water daily.  Reviewed heart healthy and diabetic diet    Goals      Patient Stated   . patient (pt-stated)          Wants to be independent; Keep moving;  [employer] classes or Y / can get pool for water aerobics      Other   . Stay as active and as independent as possible          Continue to eat healthy and increase my  physical activity by doing chair exercises.      Fall Risk Fall Risk  01/06/2017 03/20/2016 05/31/2015 03/22/2015 03/15/2015  Falls in the past year? No No Yes No No  Risk for fall due to : Impaired balance/gait;Impaired mobility - Impaired mobility;Impaired balance/gait - -   Depression Screen PHQ 2/9 Scores 01/06/2017 05/31/2015 03/15/2015  PHQ - 2 Score 0 0 0  PHQ- 9 Score 0 - -     Cognitive Function MMSE - Mini Mental State Exam 01/06/2017  Orientation to time 5  Orientation to Place 5  Registration 3  Attention/ Calculation 5  Recall 1  Language- name 2 objects 2  Language- repeat 1  Language- follow 3 step command 3  Language- read & follow direction 1  Write a sentence 1  Copy design 0  Total score 27        Immunization History  Administered Date(s) Administered  . Influenza, High Dose Seasonal PF 05/25/2015, 01/06/2017  . Influenza,inj,Quad PF,6+ Mos 06/01/2013, 04/14/2016   Screening Tests Health Maintenance  Topic Date Due  . FOOT EXAM  09/01/1949  . OPHTHALMOLOGY EXAM  09/01/1949  . URINE MICROALBUMIN  09/01/1949  . TETANUS/TDAP  09/02/1958  . PNA vac Low Risk Adult (1 of 2 - PCV13) 09/01/2004  . INFLUENZA VACCINE  11/04/2016  . COLONOSCOPY  07/19/2018  . DEXA SCAN  Completed      Plan:    Continue doing brain stimulating activities (puzzles, reading, adult coloring books, staying active) to keep memory sharp.   Continue to eat heart healthy diet (full of fruits, vegetables, whole grains, lean protein, water--limit salt, fat, and sugar intake) and increase physical activity as tolerated.  I have personally reviewed and noted the following in the patient's chart:   . Medical and social history . Use of alcohol, tobacco or illicit drugs  . Current medications and supplements . Functional ability and status . Nutritional status . Physical activity . Advanced directives . List of other physicians . Vitals  . Screenings to include cognitive,  depression, and falls . Referrals and appointments  In addition, I have reviewed and discussed with patient certain preventive protocols, quality metrics, and best practice recommendations. A written personalized care plan for preventive services as well as general preventive health recommendations were provided to patient.     Wanda Plump, RN  01/06/2017

## 2017-01-06 ENCOUNTER — Telehealth: Payer: Self-pay | Admitting: *Deleted

## 2017-01-06 ENCOUNTER — Ambulatory Visit (INDEPENDENT_AMBULATORY_CARE_PROVIDER_SITE_OTHER): Payer: Medicare Other | Admitting: *Deleted

## 2017-01-06 VITALS — BP 142/82 | HR 101 | Resp 20 | Ht 67.0 in | Wt 254.0 lb

## 2017-01-06 DIAGNOSIS — Z0000 Encounter for general adult medical examination without abnormal findings: Secondary | ICD-10-CM

## 2017-01-06 DIAGNOSIS — Z23 Encounter for immunization: Secondary | ICD-10-CM | POA: Diagnosis not present

## 2017-01-06 NOTE — Telephone Encounter (Signed)
During AWV, patient stated that she needed an order for a wheelchair as she can only walk short distances with her walker. She also requested an order for a grab bar for her shower to increase safety and maintain her independence while showering. She has used [facility].

## 2017-01-06 NOTE — Patient Instructions (Signed)
Continue doing brain stimulating activities (puzzles, reading, adult coloring books, staying active) to keep memory sharp.   Continue to eat heart healthy diet (full of fruits, vegetables, whole grains, lean protein, water--limit salt, fat, and sugar intake) and increase physical activity as tolerated.   [name] , Thank you for taking time to come for your Medicare Wellness Visit. I appreciate your ongoing commitment to your health goals. Please review the following plan we discussed and let me know if I can assist you in the future.   These are the goals we discussed: Goals      Patient Stated   . patient (pt-stated)          Wants to be independent; Keep moving;  [employer] classes or Y / can get pool for water aerobics      Other   . Stay as active and as independent as possible          Continue to eat healthy and increase my physical activity by doing chair exercises.       This is a list of the screening recommended for you and due dates:  Health Maintenance  Topic Date Due  . Complete foot exam   09[birth year]01/1949  . Eye exam for diabetics  09[birth year]01/1949  . Urine Protein Check  09/01/1949  . Tetanus Vaccine  09/02/1958  . Pneumonia vaccines (1 of 2 - PCV13) 09/01/2004  . Flu Shot  11/04/2016  . Colon Cancer Screening  07/19/2018  . DEXA scan (bone density measurement)  Completed   Influenza Virus Vaccine injection What is this medicine? INFLUENZA VIRUS VACCINE (in floo EN zuh VAHY ruhs vak SEEN) helps to reduce the risk of getting influenza also known as the flu. The vaccine only helps protect you against some strains of the flu. This medicine may be used for other purposes; ask your health care provider or pharmacist if you have questions. COMMON BRAND NAME(S): Afluria, Agriflu, Alfuria, FLUAD, Fluarix, Fluarix Quadrivalent, Flublok, Flublok Quadrivalent, FLUCELVAX, Flulaval, Fluvirin, Fluzone, Fluzone High-Dose, Fluzone Intradermal What should I tell my health care  provider before I take this medicine? They need to know if you have any of these conditions: -bleeding disorder like hemophilia -fever or infection -Guillain-Barre syndrome or other neurological problems -immune system problems -infection with the human immunodeficiency virus (HIV) or AIDS -low blood platelet counts -multiple sclerosis -an unusual or allergic reaction to influenza virus vaccine, latex, other medicines, foods, dyes, or preservatives. Different brands of vaccines contain different allergens. Some may contain latex or eggs. Talk to your doctor about your allergies to make sure that you get the right vaccine. -pregnant or trying to get pregnant -breast-feeding How should I use this medicine? This vaccine is for injection into a muscle or under the skin. It is given by a health care professional. A copy of Vaccine Information Statements will be given before each vaccination. Read this sheet carefully each time. The sheet may change frequently. Talk to your healthcare provider to see which vaccines are right for you. Some vaccines should not be used in all age groups. Overdosage: If you think you have taken too much of this medicine contact a poison control center or emergency room at once. NOTE: This medicine is only for you. Do not share this medicine with others. What if I miss a dose? This does not apply. What may interact with this medicine? -chemotherapy or radiation therapy -medicines that lower your immune system like etanercept, anakinra, infliximab, and adalimumab -medicines that  treat or prevent blood clots like warfarin -phenytoin -steroid medicines like prednisone or cortisone -theophylline -vaccines This list may not describe all possible interactions. Give your health care provider a list of all the medicines, herbs, non-prescription drugs, or dietary supplements you use. Also tell them if you smoke, drink alcohol, or use illegal drugs. Some items may interact  with your medicine. What should I watch for while using this medicine? Report any side effects that do not go away within 3 days to your doctor or health care professional. Call your health care provider if any unusual symptoms occur within 6 weeks of receiving this vaccine. You may still catch the flu, but the illness is not usually as bad. You cannot get the flu from the vaccine. The vaccine will not protect against colds or other illnesses that may cause fever. The vaccine is needed every year. What side effects may I notice from receiving this medicine? Side effects that you should report to your doctor or health care professional as soon as possible: -allergic reactions like skin rash, itching or hives, swelling of the face, lips, or tongue Side effects that usually do not require medical attention (report to your doctor or health care professional if they continue or are bothersome): -fever -headache -muscle aches and pains -pain, tenderness, redness, or swelling at the injection site -tiredness This list may not describe all possible side effects. Call your doctor for medical advice about side effects. You may report side effects to FDA at 1-800-FDA-1088. Where should I keep my medicine? The vaccine will be given by a health care professional in a clinic, pharmacy, doctor's office, or other health care setting. You will not be given vaccine doses to store at home. NOTE: This sheet is a summary. It may not cover all possible information. If you have questions about this medicine, talk to your doctor, pharmacist, or health care provider.  2018 Elsevier/Gold Standard (2014-10-12 10:07:28)

## 2017-01-07 NOTE — Telephone Encounter (Signed)
Patient contacted and informed it is up front and ready for pick up. I could not get a hold of patient at first and left a message for daughter she may be calling back about this as well

## 2017-01-07 NOTE — Telephone Encounter (Signed)
Written rx given to [facility] to call patient for pickup.

## 2017-01-07 NOTE — Progress Notes (Signed)
Medical screening examination/treatment/procedure(s) were performed by non-physician practitioner and as supervising physician I was immediately available for consultation/collaboration. I agree with above. Elizabeth A Crawford, MD 

## 2017-01-22 ENCOUNTER — Ambulatory Visit (INDEPENDENT_AMBULATORY_CARE_PROVIDER_SITE_OTHER): Payer: Medicare Other | Admitting: General Practice

## 2017-01-22 DIAGNOSIS — I4821 Permanent atrial fibrillation: Secondary | ICD-10-CM

## 2017-01-22 DIAGNOSIS — Z7901 Long term (current) use of anticoagulants: Secondary | ICD-10-CM

## 2017-01-22 DIAGNOSIS — I482 Chronic atrial fibrillation: Secondary | ICD-10-CM | POA: Diagnosis not present

## 2017-01-22 LAB — POCT INR: INR: 2.1

## 2017-01-22 NOTE — Patient Instructions (Signed)
Pre visit review using our clinic review tool, if applicable. No additional management support is needed unless otherwise documented below in the visit note. 

## 2017-01-27 DIAGNOSIS — G4733 Obstructive sleep apnea (adult) (pediatric): Secondary | ICD-10-CM | POA: Diagnosis not present

## 2017-01-28 ENCOUNTER — Encounter: Payer: Self-pay | Admitting: Cardiology

## 2017-02-03 ENCOUNTER — Telehealth: Payer: Self-pay | Admitting: *Deleted

## 2017-02-03 DIAGNOSIS — G4733 Obstructive sleep apnea (adult) (pediatric): Secondary | ICD-10-CM

## 2017-02-03 NOTE — Telephone Encounter (Signed)
Reached out to the patient to see why she is not being compliant and patient states she wears her machine every night. Patient states she goes to the bathroom frequently during the night but she puts the mask back on when she returns to bed. Patient has been encouraged to increase her compliance. Informed patient Dr Mayford Knifeurner has ordered a 2 week autotitration and [facility] will be contacting her about testing. Patient was grateful for the call and thanked me.

## 2017-02-03 NOTE — Telephone Encounter (Signed)
-----   Message from Quintella Reichertraci R Turner, MD sent at 02/03/2017  3:24 PM EDT ----- AHI too high and Patient is not compliant.  Please get a 2 week autotitration on CPAP from 4 to 18cm H2O and encouraged increased compliance

## 2017-02-04 NOTE — Addendum Note (Signed)
Addended by: Reesa ChewJONES, Shneur [place] G on: 02/04/2017 12:21 PM   Modules accepted: Orders

## 2017-02-04 NOTE — Addendum Note (Signed)
Addended by: Reesa ChewJONES, Lechelle Wrigley G on: 02/04/2017 12:10 PM   Modules accepted: Orders

## 2017-02-15 ENCOUNTER — Encounter (INDEPENDENT_AMBULATORY_CARE_PROVIDER_SITE_OTHER): Payer: Self-pay

## 2017-02-15 ENCOUNTER — Encounter: Payer: Self-pay | Admitting: Cardiology

## 2017-02-15 ENCOUNTER — Ambulatory Visit: Payer: Medicare Other | Admitting: Cardiology

## 2017-02-15 VITALS — BP 156/80 | HR 72 | Resp 16 | Wt 245.0 lb

## 2017-02-15 DIAGNOSIS — I480 Paroxysmal atrial fibrillation: Secondary | ICD-10-CM | POA: Diagnosis not present

## 2017-02-15 MED ORDER — METOPROLOL TARTRATE 25 MG PO TABS: 25.0000 mg | ORAL_TABLET | Freq: Two times a day (BID) | ORAL | 3 refills | Status: DC

## 2017-02-15 NOTE — Progress Notes (Signed)
02/15/2017 Robin Snow   02/13/1941  [id number]  Primary Physician [name], MD Primary Cardiologist: Dr. Clifton James Electrophysiologist: Dr. [name] Sleep Clinic: Dr. Mayford Knife   Reason for Visit/CC: F/u for PAF and HTN  HPI:  Robin Snow is a 76 y.o. female who is being seen today for routine cardiovascular assessment.   Her PMH is significant for paroxysmal atrial fibrillation and long posttermination pauses, s/p PPM followed by Dr. [name]. She also has a h/o stroke, HTN, CKD, chronic venous insuffiencey, DM and OSA. She is on CPAP therapy and followed by Dr. Mayford Knife in sleep clinic. Dr. Clifton James is her primary cardiologist. She has no known h/o CAD. She had a LHC in 2001 that showed normal coronary arteries. She is on chronic coumadin therapy for PAF and h/o stroke. Her INRs are followed by her PCP, Dr. Okey Dupre. Her PCP also follows her cholesterol and basic labs.   She is here today for routien cardiac f/u. She is here today with her daughter. She reports she has been doing well. She denies CP and dyspnea. She has chronic LEE from chronic venous insuffiencey but no changes from her baseline. She denies exertional dyspnea. She sleeps with 1 pillow and denies orthopnea and PND. She admits that she is not fully compliant with her CPAP due to it being uncomfortable. She only sleeps with it about 4 hrs each night. She also denies palpitations. She reports full compliance with Coumadin. Her INRs have been therapeutic. No stroke/TIA symptoms. No abnormal bleeding. She ambulates at home with use of a cane. She denies any falls.   Current Meds  Medication Sig  . diltiazem (CARDIZEM CD) 180 MG 24 hr capsule Take 1 capsule (180 mg total) by mouth daily.  . ferrous sulfate 325 (65 FE) MG EC tablet TAKE ONE TABLET BY MOUTH TWICE DAILY  . gabapentin (NEURONTIN) 100 MG capsule TAKE 1 CAPSULE EVERY NIGHT AT BEDTIME  . gabapentin (NEURONTIN) 300 MG capsule TAKE 1 CAPSULE THREE TIMES  DAILY  . lovastatin (MEVACOR) 20 MG tablet TAKE 1 TABLET EVERY NIGHT AT BEDTIME  . meclizine (ANTIVERT) 25 MG tablet Take 1 tablet (25 mg total) by mouth every 4 (four) hours as needed for dizziness.  . metolazone (ZAROXOLYN) 2.5 MG tablet Take 1 tablet 30 minutes before lasix on Tuesday and Friday  . metoprolol tartrate (LOPRESSOR) 25 MG tablet Take 1 tablet (25 mg total) by mouth 2 (two) times daily.  [name] oxybutynin (DITROPAN) 5 MG tablet TAKE 1 TABLET EVERY DAY  . potassium chloride SA (K-DUR,KLOR-CON) 20 MEQ tablet Take 1 tablet (20 mEq total) by mouth as directed. Take 2 tabs (40 meq) in the AM and 1 tab (20 meq) in the PM  . torsemide (DEMADEX) 20 MG tablet Take 1 tablet (20 mg total) by mouth 2 (two) times daily.  [name] warfarin (COUMADIN) 5 MG tablet Take 1 tablet (5 mg total) by mouth daily.   Allergies  Allergen Reactions  . Aspirin Hives  . Hydrocodone Hives    Tolerates oxycodone and tramadol  . Rofecoxib Hives   Past Medical History:  Diagnosis Date  . AKI (acute kidney injury) (HCC) 04/2016  . Anemia   . Arthritis   . Chronic atrial fibrillation (HCC)   . Chronic edema   . Chronic venous insufficiency   . CKD (chronic kidney disease), stage III (HCC)   . Coagulopathy (HCC)   . Diabetes mellitus    Type 2  . Diverticulosis   . Dyslipidemia   .  GERD (gastroesophageal reflux disease)   . GI bleed 2015   a. lower GI from diverticular source 2015.  [name] Hx of transfusion of packed red blood cells   . Hypertension   . Morbid obesity (HCC)   . Pulmonary hypertension (HCC)   . Pyelonephritis 04/2016  . Renal infarct (HCC)    a. 04/2016- adm with flank pain, ? pyelo vs renal infarct in setting of recent subtherapeutic INR.  [name] Sinus bradycardia   . Stroke [facility]) 2015   Family History  Problem Relation Age of Onset  . Cancer Father   . Stroke Maternal Aunt    Past Surgical History:  Procedure Laterality Date  . CESAREAN SECTION    . EYE SURGERY Bilateral    cataract  surgery  . JOINT REPLACEMENT    . REPLACEMENT TOTAL KNEE BILATERAL    . TUBAL LIGATION     Social History   Socioeconomic History  . Marital status: Widowed    Spouse name: Not on file  . Number of children: 4  . Years of education: 54  . Highest education level: Not on file  Social Needs  . Financial resource strain: Not on file  . Food insecurity - worry: Not on file  . Food insecurity - inability: Not on file  . Transportation needs - medical: Not on file  . Transportation needs - non-medical: Not on file  Occupational History  . Not on file  Tobacco Use  . Smoking status: Former Smoker    Types: Cigarettes    Last attempt to quit: 07/07/1993    Years since quitting: 23.6  . Smokeless tobacco: Never Used  . Tobacco comment: .3 cig a day  Substance and Sexual Activity  . Alcohol use: No    Alcohol/week: 0.0 oz    Comment: no longer drinks alcohol  . Drug use: No  . Sexual activity: Not on file  Other Topics Concern  . Not on file  Social History Narrative   Patient is widowed and has 4 living children, and 2 deceased children.   Patient is right handed.   Patient has hs education.   Patient drinks coffee and soda once a week.     Review of Systems: General: negative for chills, fever, night sweats or weight changes.  Cardiovascular: negative for chest pain, dyspnea on exertion, edema, orthopnea, palpitations, paroxysmal nocturnal dyspnea or shortness of breath Dermatological: negative for rash Respiratory: negative for cough or wheezing Urologic: negative for hematuria Abdominal: negative for nausea, vomiting, diarrhea, bright red blood per rectum, melena, or hematemesis Neurologic: negative for visual changes, syncope, or dizziness All other systems reviewed and are otherwise negative except as noted above.   Physical Exam:  Blood pressure (!) 156/80, pulse 72, resp. rate 16, weight 245 lb (111.1 kg), SpO2 97 %.  General appearance: alert, cooperative and no  distress Neck: no carotid bruit and no JVD Lungs: clear to auscultation bilaterally Heart: regular rate and rhythm, S1, S2 normal, no murmur, click, rub or gallop Extremities: chronic bilateral LEE Pulses: 2+ and symmetric Skin: Skin color, texture, turgor normal. No rashes or lesions Neurologic: Grossly normal  EKG not performed -- personally reviewed   ASSESSMENT AND PLAN:   1. PAF: RRR on exam. She denies palpitations. HR is stable. She is on coumadin for a/c.   2. Tachy Brady Syndrome: s/p PPM followed by Dr. [name]  3. HTN: moderately elevated in the 150s systolic but pt did not get a chance to take her  morning meds prior to office visit this morning. She reports full compliance. Typically controlled at home.   4. Chronic Anticoagulation: for secondary prevention of CVA in the setting of PAF. INRs followed by PCP. Stable w/o abnormal bleeding or falls.   5. DLD: on statin therapy. Lipid profile is followed by PCP.   6. Chronic Venous Insuffienceny: chronic edema but stable and c/w with her baseline. Continue lasix. Elevate legs and feet when able.   7. OSA:  Has CPAP but pt admits she has not been fully compliant due to device being "uncomfortable". She wears device ~ 4 hrs each night. She has sleep clinic f/u with Dr. Mayford Knife later this month.   Follow-Up: no changes made today. F/u with Dr. Clifton James in 6 months. Continue routine f/u in EP and sleep clinic as directed.   Caelin Rayl Delmer Islam, MHS [facility] 02/15/2017 10:28 AM

## 2017-02-15 NOTE — Patient Instructions (Addendum)
Medication Instructions:   Your physician recommends that you continue on your current medications as directed. Please refer to the Current Medication list given to you today.   If you need a refill on your cardiac medications before your next appointment, please call your pharmacy.  Labwork: NONE ORDERED  TODAY    Testing/Procedures: NONE ORDERED  TODAY   Follow-Up:   AS SCHEDULED   Any Other Special Instructions Will Be Listed Below (If Applicable).                                                                                                                                                   

## 2017-02-16 ENCOUNTER — Encounter: Payer: Self-pay | Admitting: Cardiology

## 2017-02-17 ENCOUNTER — Telehealth: Payer: Self-pay | Admitting: Internal Medicine

## 2017-02-17 ENCOUNTER — Ambulatory Visit (INDEPENDENT_AMBULATORY_CARE_PROVIDER_SITE_OTHER): Payer: Medicare Other | Admitting: General Practice

## 2017-02-17 DIAGNOSIS — I482 Chronic atrial fibrillation: Secondary | ICD-10-CM | POA: Diagnosis not present

## 2017-02-17 DIAGNOSIS — Z7901 Long term (current) use of anticoagulants: Secondary | ICD-10-CM | POA: Diagnosis not present

## 2017-02-17 DIAGNOSIS — I4821 Permanent atrial fibrillation: Secondary | ICD-10-CM

## 2017-02-17 LAB — POCT INR: INR: 1.6

## 2017-02-17 MED ORDER — LOVASTATIN 20 MG PO TABS: 20.0000 mg | ORAL_TABLET | Freq: Every day | ORAL | 0 refills | Status: DC

## 2017-02-17 MED ORDER — GABAPENTIN 100 MG PO CAPS: ORAL_CAPSULE | ORAL | 0 refills | Status: DC

## 2017-02-17 MED ORDER — OXYBUTYNIN CHLORIDE 5 MG PO TABS: 5.0000 mg | ORAL_TABLET | Freq: Every day | ORAL | 0 refills | Status: DC

## 2017-02-17 MED ORDER — MECLIZINE HCL 25 MG PO TABS: 25.0000 mg | ORAL_TABLET | ORAL | 0 refills | Status: DC | PRN

## 2017-02-17 MED ORDER — GABAPENTIN 300 MG PO CAPS: ORAL_CAPSULE | ORAL | 0 refills | Status: DC

## 2017-02-17 NOTE — Telephone Encounter (Signed)
Per chart pt is due for annual in March sent 90 day scripts to [facility]of...Raechel Chute/lmb

## 2017-02-17 NOTE — Patient Instructions (Addendum)
Pre visit review using our clinic review tool, if applicable. No additional management support is needed unless otherwise documented below in the visit note.  Change dosage and take 1/2 tablet all days except 1 tablet on Monday/Wednesdays and Fridays.  Re-check in 4 weeks.

## 2017-02-17 NOTE — Progress Notes (Signed)
Medical treatment/procedure(s) were performed by non-physician practitioner and as supervising physician I was immediately available for consultation/collaboration. I agree with above. Elizabeth A Crawford, MD  

## 2017-02-17 NOTE — Telephone Encounter (Signed)
Pt was in the office today and requested refill on: Gabapentin 300mg  Gabpentien 100mg  Lovastatin 20mg  Meclizine 25mg  Oxybutynin 5mg  Toresmide 20mg  Warfarin 5mg  [facility] [address].

## 2017-02-22 ENCOUNTER — Ambulatory Visit (INDEPENDENT_AMBULATORY_CARE_PROVIDER_SITE_OTHER): Payer: Medicare Other | Admitting: *Deleted

## 2017-02-22 ENCOUNTER — Encounter (HOSPITAL_COMMUNITY): Payer: Self-pay | Admitting: Emergency Medicine

## 2017-02-22 ENCOUNTER — Inpatient Hospital Stay (HOSPITAL_COMMUNITY)
Admission: EM | Admit: 2017-02-22 | Discharge: 2017-02-23 | DRG: 313 | Disposition: A | Discharge status: Home / Self Care | Payer: Medicare Other | Attending: Internal Medicine | Admitting: Internal Medicine

## 2017-02-22 ENCOUNTER — Other Ambulatory Visit: Payer: Self-pay

## 2017-02-22 ENCOUNTER — Observation Stay (HOSPITAL_COMMUNITY): Payer: Medicare Other

## 2017-02-22 ENCOUNTER — Emergency Department (HOSPITAL_COMMUNITY): Payer: Medicare Other

## 2017-02-22 DIAGNOSIS — K219 Gastro-esophageal reflux disease without esophagitis: Secondary | ICD-10-CM | POA: Diagnosis not present

## 2017-02-22 DIAGNOSIS — B9620 Unspecified Escherichia coli [E. coli] as the cause of diseases classified elsewhere: Secondary | ICD-10-CM | POA: Diagnosis present

## 2017-02-22 DIAGNOSIS — Z886 Allergy status to analgesic agent status: Secondary | ICD-10-CM | POA: Diagnosis not present

## 2017-02-22 DIAGNOSIS — E7800 Pure hypercholesterolemia, unspecified: Secondary | ICD-10-CM

## 2017-02-22 DIAGNOSIS — I482 Chronic atrial fibrillation: Secondary | ICD-10-CM | POA: Diagnosis present

## 2017-02-22 DIAGNOSIS — Z888 Allergy status to other drugs, medicaments and biological substances status: Secondary | ICD-10-CM | POA: Diagnosis not present

## 2017-02-22 DIAGNOSIS — E785 Hyperlipidemia, unspecified: Secondary | ICD-10-CM | POA: Diagnosis not present

## 2017-02-22 DIAGNOSIS — E118 Type 2 diabetes mellitus with unspecified complications: Secondary | ICD-10-CM | POA: Diagnosis not present

## 2017-02-22 DIAGNOSIS — R5381 Other malaise: Secondary | ICD-10-CM

## 2017-02-22 DIAGNOSIS — I495 Sick sinus syndrome: Secondary | ICD-10-CM | POA: Diagnosis not present

## 2017-02-22 DIAGNOSIS — Z79899 Other long term (current) drug therapy: Secondary | ICD-10-CM | POA: Diagnosis not present

## 2017-02-22 DIAGNOSIS — Z8673 Personal history of transient ischemic attack (TIA), and cerebral infarction without residual deficits: Secondary | ICD-10-CM | POA: Diagnosis not present

## 2017-02-22 DIAGNOSIS — Z87891 Personal history of nicotine dependence: Secondary | ICD-10-CM | POA: Diagnosis not present

## 2017-02-22 DIAGNOSIS — Z7901 Long term (current) use of anticoagulants: Secondary | ICD-10-CM | POA: Diagnosis not present

## 2017-02-22 DIAGNOSIS — E669 Obesity, unspecified: Secondary | ICD-10-CM | POA: Diagnosis not present

## 2017-02-22 DIAGNOSIS — I130 Hypertensive heart and chronic kidney disease with heart failure and stage 1 through stage 4 chronic kidney disease, or unspecified chronic kidney disease: Secondary | ICD-10-CM | POA: Diagnosis not present

## 2017-02-22 DIAGNOSIS — R079 Chest pain, unspecified: Principal | ICD-10-CM | POA: Diagnosis present

## 2017-02-22 DIAGNOSIS — Z885 Allergy status to narcotic agent status: Secondary | ICD-10-CM

## 2017-02-22 DIAGNOSIS — Z950 Presence of cardiac pacemaker: Secondary | ICD-10-CM

## 2017-02-22 DIAGNOSIS — E1122 Type 2 diabetes mellitus with diabetic chronic kidney disease: Secondary | ICD-10-CM | POA: Diagnosis not present

## 2017-02-22 DIAGNOSIS — I272 Pulmonary hypertension, unspecified: Secondary | ICD-10-CM | POA: Diagnosis present

## 2017-02-22 DIAGNOSIS — N390 Urinary tract infection, site not specified: Secondary | ICD-10-CM | POA: Diagnosis present

## 2017-02-22 DIAGNOSIS — I480 Paroxysmal atrial fibrillation: Secondary | ICD-10-CM | POA: Diagnosis present

## 2017-02-22 DIAGNOSIS — I10 Essential (primary) hypertension: Secondary | ICD-10-CM | POA: Diagnosis not present

## 2017-02-22 DIAGNOSIS — G8191 Hemiplegia, unspecified affecting right dominant side: Secondary | ICD-10-CM

## 2017-02-22 DIAGNOSIS — N3000 Acute cystitis without hematuria: Secondary | ICD-10-CM

## 2017-02-22 DIAGNOSIS — E1140 Type 2 diabetes mellitus with diabetic neuropathy, unspecified: Secondary | ICD-10-CM | POA: Diagnosis present

## 2017-02-22 DIAGNOSIS — Z6838 Body mass index (BMI) 38.0-38.9, adult: Secondary | ICD-10-CM

## 2017-02-22 DIAGNOSIS — R0789 Other chest pain: Secondary | ICD-10-CM | POA: Diagnosis not present

## 2017-02-22 DIAGNOSIS — N183 Chronic kidney disease, stage 3 unspecified: Secondary | ICD-10-CM | POA: Diagnosis present

## 2017-02-22 DIAGNOSIS — I252 Old myocardial infarction: Secondary | ICD-10-CM | POA: Diagnosis not present

## 2017-02-22 DIAGNOSIS — I5032 Chronic diastolic (congestive) heart failure: Secondary | ICD-10-CM | POA: Diagnosis not present

## 2017-02-22 DIAGNOSIS — E1121 Type 2 diabetes mellitus with diabetic nephropathy: Secondary | ICD-10-CM | POA: Diagnosis present

## 2017-02-22 DIAGNOSIS — I4891 Unspecified atrial fibrillation: Secondary | ICD-10-CM | POA: Diagnosis not present

## 2017-02-22 DIAGNOSIS — G4733 Obstructive sleep apnea (adult) (pediatric): Secondary | ICD-10-CM | POA: Diagnosis not present

## 2017-02-22 DIAGNOSIS — I4821 Permanent atrial fibrillation: Secondary | ICD-10-CM | POA: Diagnosis present

## 2017-02-22 HISTORY — DX: Obstructive sleep apnea (adult) (pediatric): G47.33

## 2017-02-22 LAB — URINALYSIS, ROUTINE W REFLEX MICROSCOPIC
BILIRUBIN URINE: NEGATIVE
Glucose, UA: NEGATIVE mg/dL
KETONES UR: NEGATIVE mg/dL
Nitrite: NEGATIVE
PH: 6 (ref 5.0–8.0)
Protein, ur: NEGATIVE mg/dL
SPECIFIC GRAVITY, URINE: 1.009 (ref 1.005–1.030)

## 2017-02-22 LAB — NM MYOCAR MULTI W/SPECT W/WALL MOTION / EF
CHL CUP RESTING HR STRESS: 67 {beats}/min
CSEPPHR: 96 {beats}/min

## 2017-02-22 LAB — BRAIN NATRIURETIC PEPTIDE: B Natriuretic Peptide: 168.8 pg/mL — ABNORMAL HIGH (ref 0.0–100.0)

## 2017-02-22 LAB — COMPREHENSIVE METABOLIC PANEL
ALBUMIN: 3.2 g/dL — AB (ref 3.5–5.0)
ALT: 10 U/L — ABNORMAL LOW (ref 14–54)
ANION GAP: 7 (ref 5–15)
AST: 20 U/L (ref 15–41)
Alkaline Phosphatase: 90 U/L (ref 38–126)
BUN: 16 mg/dL (ref 6–20)
CO2: 26 mmol/L (ref 22–32)
Calcium: 8.5 mg/dL — ABNORMAL LOW (ref 8.9–10.3)
Chloride: 107 mmol/L (ref 101–111)
Creatinine, Ser: 1.65 mg/dL — ABNORMAL HIGH (ref 0.44–1.00)
GFR calc non Af Amer: 29 mL/min — ABNORMAL LOW (ref >60)
GFR, EST AFRICAN AMERICAN: 33 mL/min — AB (ref >60)
GLUCOSE: 102 mg/dL — AB (ref 65–99)
POTASSIUM: 4.4 mmol/L (ref 3.5–5.1)
SODIUM: 140 mmol/L (ref 135–145)
Total Bilirubin: 0.9 mg/dL (ref 0.3–1.2)
Total Protein: 7.4 g/dL (ref 6.5–8.1)

## 2017-02-22 LAB — CBC
HEMATOCRIT: 32 % — AB (ref 36.0–46.0)
Hemoglobin: 10.6 g/dL — ABNORMAL LOW (ref 12.0–15.0)
MCH: 28.9 pg (ref 26.0–34.0)
MCHC: 33.1 g/dL (ref 30.0–36.0)
MCV: 87.2 fL (ref 78.0–100.0)
PLATELETS: 139 10*3/uL — AB (ref 150–400)
RBC: 3.67 MIL/uL — ABNORMAL LOW (ref 3.87–5.11)
RDW: 15.2 % (ref 11.5–15.5)
WBC: 6.2 10*3/uL (ref 4.0–10.5)

## 2017-02-22 LAB — LIPID PANEL
CHOL/HDL RATIO: 5.2 ratio
Cholesterol: 146 mg/dL (ref 0–200)
HDL: 28 mg/dL — AB (ref >40)
LDL CALC: 94 mg/dL (ref 0–99)
Triglycerides: 120 mg/dL (ref <150)
VLDL: 24 mg/dL (ref 0–40)

## 2017-02-22 LAB — I-STAT TROPONIN, ED: TROPONIN I, POC: 0.01 ng/mL (ref 0.00–0.08)

## 2017-02-22 LAB — TROPONIN I: Troponin I: <0.03 ng/mL (ref <0.03)

## 2017-02-22 LAB — PROTIME-INR
INR: 1.85
PROTHROMBIN TIME: 21.2 s — AB (ref 11.4–15.2)

## 2017-02-22 LAB — CBG MONITORING, ED: GLUCOSE-CAPILLARY: 97 mg/dL (ref 65–99)

## 2017-02-22 MED ORDER — DILTIAZEM HCL ER COATED BEADS 180 MG PO CP24
180.0000 mg | ORAL_CAPSULE | Freq: Every day | ORAL | Status: DC
  Administered 2017-02-22 – 2017-02-23 (×2): 180 mg via ORAL
  Filled 2017-02-22 (×2): qty 1

## 2017-02-22 MED ORDER — FERROUS SULFATE 325 (65 FE) MG PO TABS
325.0000 mg | ORAL_TABLET | Freq: Two times a day (BID) | ORAL | Status: DC
  Administered 2017-02-22 – 2017-02-23 (×3): 325 mg via ORAL
  Filled 2017-02-22 (×4): qty 1

## 2017-02-22 MED ORDER — GABAPENTIN 100 MG PO CAPS
100.0000 mg | ORAL_CAPSULE | Freq: Every day | ORAL | Status: DC
  Administered 2017-02-22: 100 mg via ORAL
  Filled 2017-02-22: qty 1

## 2017-02-22 MED ORDER — WARFARIN SODIUM 7.5 MG PO TABS
7.5000 mg | ORAL_TABLET | Freq: Once | ORAL | Status: AC
  Administered 2017-02-22: 7.5 mg via ORAL
  Filled 2017-02-22: qty 1

## 2017-02-22 MED ORDER — GABAPENTIN 300 MG PO CAPS
300.0000 mg | ORAL_CAPSULE | ORAL | Status: DC
  Administered 2017-02-23: 300 mg via ORAL
  Filled 2017-02-22: qty 1

## 2017-02-22 MED ORDER — PRAVASTATIN SODIUM 20 MG PO TABS
20.0000 mg | ORAL_TABLET | Freq: Every day | ORAL | Status: DC
  Administered 2017-02-22: 20 mg via ORAL
  Filled 2017-02-22 (×2): qty 1

## 2017-02-22 MED ORDER — TORSEMIDE 20 MG PO TABS
20.0000 mg | ORAL_TABLET | Freq: Two times a day (BID) | ORAL | Status: DC
  Administered 2017-02-22 – 2017-02-23 (×3): 20 mg via ORAL
  Filled 2017-02-22 (×3): qty 1

## 2017-02-22 MED ORDER — GABAPENTIN 300 MG PO CAPS: 300.0000 mg | ORAL_CAPSULE | Freq: Three times a day (TID) | ORAL | Status: DC

## 2017-02-22 MED ORDER — DEXTROSE 5 % IV SOLN
1.0000 g | Freq: Once | INTRAVENOUS | Status: AC
  Administered 2017-02-22: 1 g via INTRAVENOUS
  Filled 2017-02-22: qty 10

## 2017-02-22 MED ORDER — REGADENOSON 0.4 MG/5ML IV SOLN
INTRAVENOUS | Status: AC
  Administered 2017-02-22: 0.4 mg via INTRAVENOUS
  Filled 2017-02-22: qty 5

## 2017-02-22 MED ORDER — TECHNETIUM TC 99M TETROFOSMIN IV KIT
30.0000 | PACK | Freq: Once | INTRAVENOUS | Status: AC | PRN
  Administered 2017-02-22: 30 via INTRAVENOUS

## 2017-02-22 MED ORDER — REGADENOSON 0.4 MG/5ML IV SOLN
0.4000 mg | Freq: Once | INTRAVENOUS | Status: AC
  Administered 2017-02-22: 0.4 mg via INTRAVENOUS
  Filled 2017-02-22: qty 5

## 2017-02-22 MED ORDER — METOPROLOL TARTRATE 25 MG PO TABS
25.0000 mg | ORAL_TABLET | Freq: Two times a day (BID) | ORAL | Status: DC
  Administered 2017-02-22 – 2017-02-23 (×3): 25 mg via ORAL
  Filled 2017-02-22 (×3): qty 1

## 2017-02-22 MED ORDER — ONDANSETRON HCL 4 MG/2ML IJ SOLN
4.0000 mg | Freq: Four times a day (QID) | INTRAMUSCULAR | Status: DC | PRN
  Filled 2017-02-22: qty 2

## 2017-02-22 MED ORDER — ACETAMINOPHEN 325 MG PO TABS
650.0000 mg | ORAL_TABLET | ORAL | Status: DC | PRN
  Administered 2017-02-22 (×2): 650 mg via ORAL
  Filled 2017-02-22 (×2): qty 2

## 2017-02-22 MED ORDER — FENTANYL CITRATE (PF) 100 MCG/2ML IJ SOLN: 50.0000 ug | Freq: Once | INTRAMUSCULAR | Status: DC

## 2017-02-22 MED ORDER — DEXTROSE 5 % IV SOLN
1.0000 g | INTRAVENOUS | Status: DC
  Administered 2017-02-23: 1 g via INTRAVENOUS
  Filled 2017-02-22 (×2): qty 10

## 2017-02-22 MED ORDER — POTASSIUM CHLORIDE CRYS ER 20 MEQ PO TBCR
20.0000 meq | EXTENDED_RELEASE_TABLET | Freq: Every day | ORAL | Status: DC
  Administered 2017-02-22: 20 meq via ORAL
  Filled 2017-02-22: qty 1

## 2017-02-22 MED ORDER — TORSEMIDE 20 MG PO TABS: 20.0000 mg | ORAL_TABLET | Freq: Once | ORAL | Status: DC

## 2017-02-22 MED ORDER — TECHNETIUM TC 99M TETROFOSMIN IV KIT
10.0000 | PACK | Freq: Once | INTRAVENOUS | Status: AC | PRN
  Administered 2017-02-22: 10 via INTRAVENOUS

## 2017-02-22 MED ORDER — POTASSIUM CHLORIDE CRYS ER 20 MEQ PO TBCR
40.0000 meq | EXTENDED_RELEASE_TABLET | Freq: Every day | ORAL | Status: DC
  Administered 2017-02-22 – 2017-02-23 (×2): 40 meq via ORAL
  Filled 2017-02-22 (×2): qty 2

## 2017-02-22 MED ORDER — OXYBUTYNIN CHLORIDE 5 MG PO TABS
5.0000 mg | ORAL_TABLET | Freq: Every day | ORAL | Status: DC
  Administered 2017-02-22 – 2017-02-23 (×2): 5 mg via ORAL
  Filled 2017-02-22 (×2): qty 1

## 2017-02-22 MED ORDER — SODIUM CHLORIDE 0.9 % IV SOLN
INTRAVENOUS | Status: DC
  Administered 2017-02-22: 04:00:00 via INTRAVENOUS

## 2017-02-22 MED ORDER — GI COCKTAIL ~~LOC~~: 30.0000 mL | Freq: Four times a day (QID) | ORAL | Status: DC | PRN

## 2017-02-22 MED ORDER — MECLIZINE HCL 25 MG PO TABS: 25.0000 mg | ORAL_TABLET | ORAL | Status: DC | PRN

## 2017-02-22 MED ORDER — NITROGLYCERIN 0.4 MG SL SUBL: 0.4000 mg | SUBLINGUAL_TABLET | SUBLINGUAL | Status: DC | PRN

## 2017-02-22 MED ORDER — WARFARIN - PHARMACIST DOSING INPATIENT: Freq: Every day | Status: DC

## 2017-02-22 NOTE — ED Notes (Signed)
Patient denies pain and is resting comfortably.  

## 2017-02-22 NOTE — ED Notes (Signed)
Pt denies any chest pain at this time 

## 2017-02-22 NOTE — Consult Note (Signed)
Cardiology Consultation:   Patient ID: Robin Snow; [id number]; 1940-12-11   Admit date: 02/22/2017 Date of Consult: 02/22/2017  Primary Care Provider: [name], MD Primary Cardiologist: Dr. Clifton Snow Primary Electrophysiologist:  Dr. Ladona Snow   Patient Profile:   Robin Snow is a 76 y.o. female with a hx of paroxysmal atrial fibrillation with long post-termination pauses, tachy-brady syndrome s/p PPM (Dr. Ladona Snow), on long-term anticoagulation, hx of stroke, HTN, HLD, CKD, chronic venous insufficiency, DM, GERD, and OSA on CPAP (Dr. Mayford Snow) who is being seen today for the evaluation of chest pain at the request of Dr. Konrad Snow.  History of Present Illness:   [name] is well-known to this service and last saw Robin Snow in clinic on 02/15/17. At that time, she was in her usual state of health. She underwent LHC in 2001 with normal coronaries.   She presented to [facility] with new onset chest pain. Initial troponin was negative. She has risk factors for ACS including obesity, HTN, HLD, DM, and former smoker. She reported to [facility] with onset of persistent left sided chest pain under her left breast that radiated to her central chest. Chest pain started when she was at rest watching TV. The pain was described as a stabbing pain but also a pressure and rated as a 10-10. The pain was associated with nausea, no vomiting.  She states that she was short of breath after the pain resolved.  Her chest pain had resolved upon arrival to the ED. The pain lasted approximately 30 minutes in total.  She did not receive nitro or morphine.  She states that she has never had this pain before.  She is not active at home.  She walks with a walker and a cane.  She does not exert herself but states that she keeps her bedroom and bathroom clean.   Initial troponin was negative.  EKG without signs of acute ischemia.   Past Medical History:  Diagnosis Date  . AKI (acute kidney injury) (HCC)  04/2016  . Anemia   . Arthritis   . Chronic atrial fibrillation (HCC)   . Chronic edema   . Chronic venous insufficiency   . CKD (chronic kidney disease), stage III (HCC)   . Coagulopathy (HCC)   . Diabetes mellitus    Type 2  . Diverticulosis   . Dyslipidemia   . GERD (gastroesophageal reflux disease)   . GI bleed 2015   a. lower GI from diverticular source 2015.  [name] Hx of transfusion of packed red blood cells   . Hypertension   . Morbid obesity (HCC)   . Obstructive sleep apnea    on CPAP  . Pulmonary hypertension (HCC)   . Pyelonephritis 04/2016  . Renal infarct (HCC)    a. 04/2016- adm with flank pain, ? pyelo vs renal infarct in setting of recent subtherapeutic INR.  [name] Sinus bradycardia   . Stroke [facility]) 2015    Past Surgical History:  Procedure Laterality Date  . CESAREAN SECTION    . COLONOSCOPY Left 01/24/2013   Performed by Robin Modena, MD at [facility] ENDOSCOPY  . ESOPHAGOGASTRODUODENOSCOPY (EGD) Left 01/23/2013   Performed by Robin Modena, MD at [facility] ENDOSCOPY  . EYE SURGERY Bilateral    cataract surgery  . JOINT REPLACEMENT    . Pacemaker Implant [name] 09/09/2016   Performed by Robin Maw, MD at [facility] INVASIVE CV LAB  . REPLACEMENT TOTAL KNEE BILATERAL    . TUBAL LIGATION  Home Medications:  Prior to Admission medications   Medication Sig Start Date End Date Taking? Authorizing Provider  diltiazem (CARDIZEM CD) 180 MG 24 hr capsule Take 1 capsule (180 mg total) by mouth daily. 09/10/16  Yes Robin Nine, MD  ferrous sulfate 325 (65 FE) MG EC tablet TAKE ONE TABLET BY MOUTH TWICE DAILY 05/22/16  Yes Robin Broker, MD  gabapentin (NEURONTIN) 100 MG capsule TAKE 1 CAPSULE EVERY NIGHT AT BEDTIME 02/17/17  Yes Robin Broker, MD  gabapentin (NEURONTIN) 300 MG capsule TAKE 1 CAPSULE THREE TIMES DAILY 02/17/17  Yes Robin Broker, MD  lovastatin (MEVACOR) 20 MG tablet Take 1 tablet (20 mg total) at bedtime by mouth. 02/17/17  Yes Robin Broker, MD  meclizine (ANTIVERT) 25 MG tablet Take 1 tablet (25 mg total) every 4 (four) hours as needed by mouth for dizziness. 02/17/17  Yes Robin Broker, MD  metoprolol tartrate (LOPRESSOR) 25 MG tablet Take 1 tablet (25 mg total) 2 (two) times daily by mouth. 02/15/17 05/16/17 Yes Robin Snow M, PA-C  oxybutynin (DITROPAN) 5 MG tablet Take 1 tablet (5 mg total) daily by mouth. 02/17/17  Yes Robin Broker, MD  potassium chloride SA (K-DUR,KLOR-CON) 20 MEQ tablet Take 1 tablet (20 mEq total) by mouth as directed. Take 2 tabs (40 meq) in the AM and 1 tab (20 meq) in the PM 08/19/16  Yes Snow, Robin Picket T, PA-C  torsemide (DEMADEX) 20 MG tablet Take 1 tablet (20 mg total) by mouth 2 (two) times daily. 12/10/16  Yes Robin Broker, MD  warfarin (COUMADIN) 5 MG tablet Take 1 tablet (5 mg total) by mouth daily. Patient taking differently: Take 5 mg daily by mouth. Monday Wednesday Friday. 5 mg all other days 2.5 mg 03/05/16  Yes Robin Broker, MD    Inpatient Medications: Scheduled Meds: . diltiazem  180 mg Oral Daily  . fentaNYL (SUBLIMAZE) injection  50 mcg Intravenous Once  . ferrous sulfate  325 mg Oral BID  . gabapentin  100 mg Oral QHS  . gabapentin  300 mg Oral TID  . metoprolol tartrate  25 mg Oral BID  . oxybutynin  5 mg Oral Daily  . potassium chloride  20 mEq Oral QHS  . potassium chloride SA  40 mEq Oral Daily  . pravastatin  20 mg Oral q1800  . torsemide  20 mg Oral BID  . torsemide  20 mg Oral Once  . warfarin  7.5 mg Oral ONCE-1800  . Warfarin - Pharmacist Dosing Inpatient   Does not apply q1800   Continuous Infusions: . [START ON 02/23/2017] cefTRIAXone (ROCEPHIN)  IV     PRN Meds: acetaminophen, gi cocktail, meclizine, nitroGLYCERIN, ondansetron (ZOFRAN) IV  Allergies:    Allergies  Allergen Reactions  . Aspirin Hives  . Hydrocodone Hives    Tolerates oxycodone and tramadol  . Rofecoxib Hives    Social History:   Social  History   Socioeconomic History  . Marital status: Widowed    Spouse name: Not on file  . Number of children: 4  . Years of education: 5  . Highest education level: Not on file  Social Needs  . Financial resource strain: Not on file  . Food insecurity - worry: Not on file  . Food insecurity - inability: Not on file  . Transportation needs - medical: Not on file  . Transportation needs - non-medical: Not on file  Occupational History  . Not on file  Tobacco Use  . Smoking status: Former Smoker    Types: Cigarettes    Last attempt to quit: 07/07/1993    Years since quitting: 23.6  . Smokeless tobacco: Never Used  . Tobacco comment: .3 cig a day  Substance and Sexual Activity  . Alcohol use: No    Alcohol/week: 0.0 oz    Comment: no longer drinks alcohol  . Drug use: No  . Sexual activity: Not on file  Other Topics Concern  . Not on file  Social History Narrative   Patient is widowed and has 4 living children, and 2 deceased children.   Patient is right handed.   Patient has hs education.   Patient drinks coffee and soda once a week.    Family History:    Family History  Problem Relation Age of Onset  . Cancer Father   . Stroke Maternal Aunt      ROS:  Please see the history of present illness.  ROS  All other ROS reviewed and negative.     Physical Exam/Data:   Vitals:   02/22/17 0815 02/22/17 0830 02/22/17 0900 02/22/17 0915  BP: 121/67 122/70 123/80 139/85  Pulse: 84 62 69 (!) 32  Resp: 17 17 18 16   Temp:      TempSrc:      SpO2: 98% 95% 95% 97%  Weight:      Height:        Intake/Output Summary (Last 24 hours) at 02/22/2017 0924 Last data filed at 02/22/2017 0705 Gross per 24 hour  Intake 385.42 ml  Output -  Net 385.42 ml   Filed Weights   02/22/17 0324 02/22/17 0329  Weight: 245 lb (111.1 kg) 245 lb (111.1 kg)   Body mass index is 38.37 kg/m.  General:  Well nourished, well developed, in no acute distress HEENT: normal Neck: no  JVD Vascular: No carotid bruits Cardiac:  normal S1, S2; RRR; no murmur but bounding Lungs:  clear to auscultation bilaterally, no wheezing, rhonchi or rales  Abd: soft, nontender, no hepatomegaly  Ext: LE edema with skin changes consistent with chronic venous insufficiency, states this is her baseline Musculoskeletal:  No deformities, BUE and BLE strength normal and equal Skin: warm and dry  Neuro:  CNs 2-12 intact, no focal abnormalities noted Psych:  Normal affect   EKG:  The EKG was personally reviewed and demonstrates:  sinus Telemetry:  Telemetry was personally reviewed and demonstrates:  Sinus - Afib  Relevant CV Studies:  Echo 04/16/16: Study Conclusions - Left ventricle: The cavity size was normal. Systolic function was   vigorous. The estimated ejection fraction was in the range of 65%   to 70%. Wall motion was normal; there were no regional wall   motion abnormalities. - Mitral valve: There was moderate regurgitation. - Left atrium: The atrium was severely dilated. - Right atrium: The atrium was moderately to severely dilated. - Pulmonary arteries: Systolic pressure was moderately increased.   PA peak pressure: 52 mm Hg (S).  Laboratory Data:  Chemistry Recent Labs  Lab 02/22/17 0340  NA 140  K 4.4  CL 107  CO2 26  GLUCOSE 102*  BUN 16  CREATININE 1.65*  CALCIUM 8.5*  GFRNONAA 29*  GFRAA 33*  ANIONGAP 7    Recent Labs  Lab 02/22/17 0340  PROT 7.4  ALBUMIN 3.2*  AST 20  ALT 10*  ALKPHOS 90  BILITOT 0.9   Hematology Recent Labs  Lab 02/22/17 0345  WBC 6.2  RBC  3.67*  HGB 10.6*  HCT 32.0*  MCV 87.2  MCH 28.9  MCHC 33.1  RDW 15.2  PLT 139*   Cardiac EnzymesNo results for input(s): TROPONINI in the last 168 hours.  Recent Labs  Lab 02/22/17 0405  TROPIPOC 0.01    BNP Recent Labs  Lab 02/22/17 0340  BNP 168.8*    DDimer No results for input(s): DDIMER in the last 168 hours.  Radiology/Studies:  Dg Chest 2 View  Result Date:  02/22/2017 CLINICAL DATA:  Centralized chest pain beginning 1 hour ago. Pitting edema to the lower extremities. History of myocardial infarct, hypertension, pacemaker EXAM: CHEST  2 VIEW COMPARISON:  09/10/2016 FINDINGS: Cardiac pacemaker. Shallow inspiration. Cardiac enlargement with pulmonary vascular congestion. Interstitial pattern to the lung bases suggesting mild edema. No blunting of costophrenic angles. No pneumothorax. Mediastinal contours appear intact. Calcification of the aorta. Thoracolumbar scoliosis. Degenerative changes in the spine and shoulders. Stimulator leads over the lower thoracic region. IMPRESSION: Cardiac enlargement. Pulmonary vascular congestion with mild interstitial edema. Electronically Signed   By: Burman Nieves M.D.   On: 02/22/2017 04:04    Assessment and Plan:   1. Chest pain, no known CAD - Initial troponin was negative - EKG without signs of acute ischemia - Continue trending troponins If troponin becomes positive, we will consider heart catheterization.  However given her CKD and elevated creatinine, special consideration should be given to cath.  If troponin remains negative, will order Myoview stress test. Hold heparin drip for now unless troponin positive. Continue home medications.    2. Tachy-brady, PAF, hx of stroke, s.p PPM - successful implantation of [employer] Jude dual-chamber PPM for SSS and CHB (09/09/16) - device last interrogated 12/28/16 with normal function - continue cardizem, and lopressor - continue coumadin  3. HTN - meds as above - pressures have been controlled  4. HLD - continue statin  5. OSA on CPAP - continue therapy, encouraged compliance  6. Venous insufficiency - continue torsemide  7. UTI - she was found to have a UTI on arrival, ABX running  8. CKD stage 3 - sCr 1.65 - baseline appears to be 1.3-1.6 - avoid nephrotoxic agents - will avoid heart cath if able    For questions or updates, please contact [facility] Please consult [email or web address] for contact info under Cardiology/STEMI.   Signed, Roe Rutherford [place], PA  02/22/2017 9:24 AM

## 2017-02-22 NOTE — ED Notes (Addendum)
Patient transported to X-Ray 

## 2017-02-22 NOTE — Progress Notes (Addendum)
Stress test reviewed with Dr. Duke Salviaandolph: "IMPRESSION: 1. Small matched defect at the cardiac apex is new from 2012 in could be from apical thinning or a small remote infarct. No inducible ischemia. 2. Minimal hypokinesis at the inferolateral base. Otherwise normal left ventricular wall motion. 3. Left ventricular ejection fraction 54% 4. Non invasive risk stratification*: Low"  She feels stress test is benign, and feels patient is OK to be discharged. Spoke with Dr. Konrad DoloresMerrell - one last troponin is in process and will need to be resulted prior to DC (if negative). Keep f/u 02/24/17 as scheduled with Dr. Mayford Knifeurner (this is a sleep appt). Spoke with patient via phone as well.  Khaylee Mcevoy PA-C

## 2017-02-22 NOTE — Progress Notes (Signed)
Remote pacemaker transmission.   

## 2017-02-22 NOTE — Progress Notes (Signed)
PT Cancellation Note  Patient Details Name: Robin Snow MRN: [id number] DOB: 1940-05-26   Cancelled Treatment:    Reason Eval/Treat Not Completed: Patient at procedure or test/unavailable. Will check bas as time allows.    [name] L [name] 02/22/2017, 2:00 PM

## 2017-02-22 NOTE — ED Notes (Signed)
Cardiology PA at bedside. 

## 2017-02-22 NOTE — ED Provider Notes (Signed)
[facility] Provider Note   CSN: [id number] Arrival date & time: 02/22/17  [id number]     History   Chief Complaint Chief Complaint  Patient presents with  . Chest Pain    HPI Robin Snow is a 76 y.o. female with a hx of AK I, chronic A. fib, chronic peripheral edema and venous insufficiency, type 2 diabetes, GERD, hypertension, obesity, stroke (anticoagulated on Coumadin) presents to the Emergency Department complaining of, persistent left-sided chest pain initially under the left breast and then radiating to the central chest onset approximately 1.5 hours prior to arrival in the emergency patient reports that she was watching TV when the pain began.  She had associated nausea and shortness of breath and reports that the pain was 10 out of 10.  She states that the symptoms resolved prior to her arrival in the emergency department and have not returned.  She did not have palpitations at that time.  Patient does report compliance with her medications including Lasix.  She states her legs have been swollen for a long time and they do not seem worse than usual.  She ambulates at home with a walker and denies worsening dyspnea on exertion.  She denies fever, chills, headache, neck pain, cough, congestion, abdominal pain, vomiting, diarrhea, weakness, dizziness, syncope, dysuria, hematuria.  The history is provided by the patient and medical records. No language interpreter was used.    Past Medical History:  Diagnosis Date  . AKI (acute kidney injury) (HCC) 04/2016  . Anemia   . Arthritis   . Chronic atrial fibrillation (HCC)   . Chronic edema   . Chronic venous insufficiency   . CKD (chronic kidney disease), stage III (HCC)   . Coagulopathy (HCC)   . Diabetes mellitus    Type 2  . Diverticulosis   . Dyslipidemia   . GERD (gastroesophageal reflux disease)   . GI bleed 2015   a. lower GI from diverticular source 2015.  [name]. Hx of transfusion of  packed red blood cells   . Hypertension   . Morbid obesity (HCC)   . Pulmonary hypertension (HCC)   . Pyelonephritis 04/2016  . Renal infarct (HCC)    a. 04/2016- adm with flank pain, ? pyelo vs renal infarct in setting of recent subtherapeutic INR.  [name]. Sinus bradycardia   . Stroke [facility](HCC) 2015    Patient Active Problem List   Diagnosis Date Noted  . Long term (current) use of anticoagulants 12/25/2016  . Orthostatic hypotension 09/08/2016  . Chronic diastolic CHF (congestive heart failure) (HCC) 09/08/2016  . Sinus pause: 4.02-4.80sec per telemetry 09/08/2016 09/08/2016  . Tachy-brady syndrome (HCC) 09/08/2016  . Sinus bradycardia 07/08/2016  . CKD (chronic kidney disease) stage 3, GFR 30-59 ml/min (HCC) 05/11/2016  . Anemia 05/01/2016  . Diabetes mellitus with complication (HCC)   . Renal infarction (HCC)   . Physical deconditioning 12/14/2014  . Status post insertion of spinal cord stimulator 12/14/2014  . Essential hypertension 05/18/2014  . HLD (hyperlipidemia) 05/18/2014  . Chronic venous insufficiency 05/11/2014  . Embolic stroke (HCC) 01/10/2014  . Right hemiparesis (HCC) 01/10/2014  . GERD (gastroesophageal reflux disease)   . Chronic pain syndrome 09/07/2013  . Painful total knee replacement (HCC) 08/01/2013  . Permanent atrial fibrillation (HCC) 05/22/2010    Past Surgical History:  Procedure Laterality Date  . CESAREAN SECTION    . COLONOSCOPY Left 01/24/2013   [facility] Willis Modenautlaw[facility] William[facility]  . ESOPHAGOGASTRODUODENOSCOPY (  EGD) Left 01/23/2013   Performed by Willis Modena, MD at [facility] ENDOSCOPY  . EYE SURGERY Bilateral    cataract surgery  . JOINT REPLACEMENT    . Pacemaker Implant [name] 09/09/2016   Performed by Marinus Maw, MD at [facility] INVASIVE CV LAB  . REPLACEMENT TOTAL KNEE BILATERAL    . TUBAL LIGATION      OB History    No data available       Home Medications    Prior to Admission medications   Medication Sig Start Date End Date  Taking? Authorizing Provider  diltiazem (CARDIZEM CD) 180 MG 24 hr capsule Take 1 capsule (180 mg total) by mouth daily. 09/10/16  Yes Tyrone Nine, MD  ferrous sulfate 325 (65 FE) MG EC tablet TAKE ONE TABLET BY MOUTH TWICE DAILY 05/22/16  Yes [name], MD  gabapentin (NEURONTIN) 100 MG capsule TAKE 1 CAPSULE EVERY NIGHT AT BEDTIME 02/17/17  Yes [name], MD  gabapentin (NEURONTIN) 300 MG capsule TAKE 1 CAPSULE THREE TIMES DAILY 02/17/17  Yes [name], MD  lovastatin (MEVACOR) 20 MG tablet Take 1 tablet (20 mg total) at bedtime by mouth. 02/17/17  Yes [name], MD  meclizine (ANTIVERT) 25 MG tablet Take 1 tablet (25 mg total) every 4 (four) hours as needed by mouth for dizziness. 02/17/17  Yes [name], MD  metoprolol tartrate (LOPRESSOR) 25 MG tablet Take 1 tablet (25 mg total) 2 (two) times daily by mouth. 02/15/17 05/16/17 Yes Robbie Lis M, PA-C  oxybutynin (DITROPAN) 5 MG tablet Take 1 tablet (5 mg total) daily by mouth. 02/17/17  Yes [name], MD  potassium chloride SA (K-DUR,KLOR-CON) 20 MEQ tablet Take 1 tablet (20 mEq total) by mouth as directed. Take 2 tabs (40 meq) in the AM and 1 tab (20 meq) in the PM 08/19/16  Yes Weaver, Lorin Picket T, PA-C  torsemide (DEMADEX) 20 MG tablet Take 1 tablet (20 mg total) by mouth 2 (two) times daily. 12/10/16  Yes [name], MD  warfarin (COUMADIN) 5 MG tablet Take 1 tablet (5 mg total) by mouth daily. Patient taking differently: Take 5 mg daily by mouth. Monday Wednesday Friday. 5 mg all other days 2.5 mg 03/05/16  Yes [name], MD  metolazone (ZAROXOLYN) 2.5 MG tablet Take 1 tablet 30 minutes before lasix on Tuesday and Friday Patient not taking: Reported on 02/22/2017 08/03/16   Laurann Montana, PA-C    Family History Family History  Problem Relation Age of Onset  . Cancer Father   . Stroke Maternal Aunt     Social History Social History   Tobacco  Use  . Smoking status: Former Smoker    Types: Cigarettes    Last attempt to quit: 07/07/1993    Years since quitting: 23.6  . Smokeless tobacco: Never Used  . Tobacco comment: .3 cig a day  Substance Use Topics  . Alcohol use: No    Alcohol/week: 0.0 oz    Comment: no longer drinks alcohol  . Drug use: No     Allergies   Aspirin; Hydrocodone; and Rofecoxib   Review of Systems Review of Systems  Constitutional: Negative for appetite change, diaphoresis, fatigue, fever and unexpected weight change.  HENT: Negative for mouth sores.   Eyes: Negative for visual disturbance.  Respiratory: Negative for cough, chest tightness, shortness of breath and wheezing.   Cardiovascular: Positive for chest pain.  Gastrointestinal: Negative for abdominal pain, constipation, diarrhea,  nausea and vomiting.  Endocrine: Negative for polydipsia, polyphagia and polyuria.  Genitourinary: Negative for dysuria, frequency, hematuria and urgency.  Musculoskeletal: Negative for back pain and neck stiffness.  Skin: Negative for rash.  Allergic/Immunologic: Negative for immunocompromised state.  Neurological: Negative for syncope, light-headedness and headaches.  Hematological: Does not bruise/bleed easily.  Psychiatric/Behavioral: Negative for sleep disturbance. The patient is not nervous/anxious.      Physical Exam Updated Vital Signs BP 129/71 (BP Location: Left Arm)   Pulse 94   Temp 99.2 F (37.3 C) (Oral)   Resp 20   Ht 5\' 7"  (1.702 m)   Wt 111.1 kg (245 lb)   SpO2 97%   BMI 38.37 kg/m   Physical Exam  Constitutional: She appears well-developed and well-nourished. No distress.  Awake, alert, nontoxic appearance  HENT:  Head: Normocephalic and atraumatic.  Mouth/Throat: Oropharynx is clear and moist. No oropharyngeal exudate.  Eyes: Conjunctivae are normal. No scleral icterus.  Neck: Normal range of motion. Neck supple.  Cardiovascular: Normal rate, regular rhythm and intact distal  pulses.  Pulmonary/Chest: Effort normal and breath sounds normal. No respiratory distress. She has no wheezes.  Equal chest expansion  Abdominal: Soft. Bowel sounds are normal. She exhibits no mass. There is no tenderness. There is no rebound and no guarding.  Musculoskeletal: Normal range of motion.       Right lower leg: She exhibits edema.  3+ pitting edema of the bilateral lower extremities without open wounds  Neurological: She is alert.  Speech is clear and goal oriented Moves extremities without ataxia  Skin: Skin is warm and dry. She is not diaphoretic.  Psychiatric: She has a normal mood and affect.  Nursing note and vitals reviewed.    ED Treatments / Results  Labs (all labs ordered are listed, but only abnormal results are displayed) Labs Reviewed  CBC - Abnormal; Notable for the following components:      Result Value   RBC 3.67 (*)    Hemoglobin 10.6 (*)    HCT 32.0 (*)    Platelets 139 (*)    All other components within normal limits  COMPREHENSIVE METABOLIC PANEL - Abnormal; Notable for the following components:   Glucose, Bld 102 (*)    Creatinine, Ser 1.65 (*)    Calcium 8.5 (*)    Albumin 3.2 (*)    ALT 10 (*)    GFR calc non Af Amer 29 (*)    GFR calc Af Amer 33 (*)    All other components within normal limits  BRAIN NATRIURETIC PEPTIDE - Abnormal; Notable for the following components:   B Natriuretic Peptide 168.8 (*)    All other components within normal limits  URINALYSIS, ROUTINE W REFLEX MICROSCOPIC - Abnormal; Notable for the following components:   Color, Urine STRAW (*)    APPearance HAZY (*)    Hgb urine dipstick SMALL (*)    Leukocytes, UA MODERATE (*)    Bacteria, UA MANY (*)    Squamous Epithelial / LPF 0-5 (*)    All other components within normal limits  PROTIME-INR - Abnormal; Notable for the following components:   Prothrombin Time 21.2 (*)    All other components within normal limits  URINE CULTURE  I-STAT TROPONIN, ED  CBG  MONITORING, ED  I-STAT TROPONIN, ED  I-STAT TROPONIN, ED    EKG  EKG Interpretation  Date/Time:  Monday February 22 2017 04:35:08 EST Ventricular Rate:  97 PR Interval:    QRS Duration: 92 QT  Interval:  356 QTC Calculation: 453 R Axis:   -16 Text Interpretation:  Sinus rhythm Borderline left axis deviation Anteroseptal infarct, old Confirmed by Nicanor Alcon, April ([id number]) on 02/22/2017 4:38:53 AM       Radiology Dg Chest 2 View  Result Date: 02/22/2017 CLINICAL DATA:  Centralized chest pain beginning 1 hour ago. Pitting edema to the lower extremities. History of myocardial infarct, hypertension, pacemaker EXAM: CHEST  2 VIEW COMPARISON:  09/10/2016 FINDINGS: Cardiac pacemaker. Shallow inspiration. Cardiac enlargement with pulmonary vascular congestion. Interstitial pattern to the lung bases suggesting mild edema. No blunting of costophrenic angles. No pneumothorax. Mediastinal contours appear intact. Calcification of the aorta. Thoracolumbar scoliosis. Degenerative changes in the spine and shoulders. Stimulator leads over the lower thoracic region. IMPRESSION: Cardiac enlargement. Pulmonary vascular congestion with mild interstitial edema. Electronically Signed   By: Burman Nieves M.D.   On: 02/22/2017 04:04    Procedures Procedures (including critical care time)  Medications Ordered in ED Medications  0.9 %  sodium chloride infusion ( Intravenous New Bag/Given 02/22/17 0412)  cefTRIAXone (ROCEPHIN) 1 g in dextrose 5 % 50 mL IVPB (not administered)     Initial Impression / Assessment and Plan / ED Course  I have reviewed the triage vital signs and the nursing notes.  Pertinent labs & imaging results that were available during my care of the patient were reviewed by me and considered in my medical decision making (see chart for details).     Patient with approximately 45 minutes of chest pain that resolved prior to arrival in the emergency department.  Initial workup is  reassuring with negative troponin and EKG with normal sinus rhythm.  Chest x-ray without evidence of pneumonia or pneumothorax.  Urinalysis shows a UTI.  Will give Rocephin.  INR is subtherapeutic.  BNP is slightly elevated.  We will plan for repeat troponin.  Shift change, care transferred to Dr. Nicanor Alcon who will follow delta trop.  She has evaluated the patient in a shared visit.    Final Clinical Impressions(s) / ED Diagnoses   Final diagnoses:  Left sided chest pain    ED Discharge Orders    None       Milta Deiters 02/22/17 1914    Palumbo, April, MD 02/22/17 [phone]

## 2017-02-22 NOTE — ED Notes (Signed)
Pt transported to [facility] on tele

## 2017-02-22 NOTE — Progress Notes (Signed)
This is a no charge note  Pending admission per Dr. Nicanor AlconPalumbo  76 year old lady with past medical history atrial fibrillation on Coumadin, hypertension, hyperlipidemia, GI bleeding, CKD-3, stroke, who presents with chest pain. Patient has bilateral leg edema. Also has UTI with positive urinalysis with moderate amount of leukocyte. Rocephin was started.  Patient was found to have WBC 6.2, INR 1.85, negative troponin, stable renal function, no tachycardia, no tachypnea, oxygen saturation 93% on room air. Patient is admitted to telemetry bed as inpatient.  Lorretta HarpXilin Makenzey Nanni, MD  [facility] Pager [phone]  If 7PM-7AM, please contact night-coverage [email or web address] Password [email or web address] 02/22/2017, 6:40 AM

## 2017-02-22 NOTE — H&P (Signed)
History and Physical    FRICA LAHOOD WUJ:[id number] DOB: 1940/07/06 DOA: 02/22/2017  PCP: [name], MD Patient coming from: home  Chief Complaint: chest pain   HPI: CHRISTINA BARBATI is a delightful 76 y.o. female with medical history significant for proximal atrial fibrillation status post PPM per Dr. [name], stroke, hypertension, chronic kidney disease, chronic venous insufficiency, diabetes and obstructive sleep apnea on C Pap presents to the emergency department with chief complaint chest pain. Initial evaluation reassuring for ACS does reveal UTI. [facility] asked to admit for further evaluation  Information is obtained from the patient and her daughter who is at the bedside. Patient states she has been in her usual state of health the last evening she was watching TV on the couch and suddenly developed chest pain located under her left breast radiating to epigastric area. She describes the pain as a combination of "pressure and throbbing". She denies palpitations. She states it lasted about 40 minutes and her daughter called EMS for transport. By the time she got to the hospital the pain had resolved. She denies diaphoresis nausea vomiting. She does endorse some shortness of breath. She denies abdominal pain or cough. She states she has chronic lower extremity edema but feels that her legs are "more swollen than usual". She denies fever chills headache recent travel or sick contacts. She states she had a flu shot about a week ago and is compliant with all her home medications. She denies diarrhea constipation melena bright red blood per rectum. She denies dysuria hematuria frequency but does endorse some urgency.    ED Course: In the emergency department she's afebrile hemodynamically stable and not hypoxic. She is provided with Rocephin  Review of Systems: As per HPI otherwise all other systems reviewed and are negative.   Ambulatory Status: She states at home she  uses a walker and a cane. If she leaves the house she is wheelchair-bound due to pain with ambulation. She denies any recent falls. She is fairly independent with ADLs  Past Medical History:  Diagnosis Date  . AKI (acute kidney injury) (HCC) 04/2016  . Anemia   . Arthritis   . Chronic atrial fibrillation (HCC)   . Chronic edema   . Chronic venous insufficiency   . CKD (chronic kidney disease), stage III (HCC)   . Coagulopathy (HCC)   . Diabetes mellitus    Type 2  . Diverticulosis   . Dyslipidemia   . GERD (gastroesophageal reflux disease)   . GI bleed 2015   a. lower GI from diverticular source 2015.  [name] Hx of transfusion of packed red blood cells   . Hypertension   . Morbid obesity (HCC)   . Obstructive sleep apnea    on CPAP  . Pulmonary hypertension (HCC)   . Pyelonephritis 04/2016  . Renal infarct (HCC)    a. 04/2016- adm with flank pain, ? pyelo vs renal infarct in setting of recent subtherapeutic INR.  [name] Sinus bradycardia   . Stroke [facility]) 2015    Past Surgical History:  Procedure Laterality Date  . CESAREAN SECTION    . COLONOSCOPY Left 01/24/2013   Performed by Willis Modena, MD at [facility] ENDOSCOPY  . ESOPHAGOGASTRODUODENOSCOPY (EGD) Left 01/23/2013   Performed by Willis Modena, MD at [facility] ENDOSCOPY  . EYE SURGERY Bilateral    cataract surgery  . JOINT REPLACEMENT    . Pacemaker Implant [name] 09/09/2016   Performed by Marinus Maw, MD at [facility] INVASIVE CV LAB  .  REPLACEMENT TOTAL KNEE BILATERAL    . TUBAL LIGATION      Social History   Socioeconomic History  . Marital status: Widowed    Spouse name: Not on file  . Number of children: 4  . Years of education: 53  . Highest education level: Not on file  Social Needs  . Financial resource strain: Not on file  . Food insecurity - worry: Not on file  . Food insecurity - inability: Not on file  . Transportation needs - medical: Not on file  . Transportation needs - non-medical: Not on file  Occupational History   . Not on file  Tobacco Use  . Smoking status: Former Smoker    Types: Cigarettes    Last attempt to quit: 07/07/1993    Years since quitting: 23.6  . Smokeless tobacco: Never Used  . Tobacco comment: .3 cig a day  Substance and Sexual Activity  . Alcohol use: No    Alcohol/week: 0.0 oz    Comment: no longer drinks alcohol  . Drug use: No  . Sexual activity: Not on file  Other Topics Concern  . Not on file  Social History Narrative   Patient is widowed and has 4 living children, and 2 deceased children.   Patient is right handed.   Patient has hs education.   Patient drinks coffee and soda once a week.    Allergies  Allergen Reactions  . Aspirin Hives  . Hydrocodone Hives    Tolerates oxycodone and tramadol  . Rofecoxib Hives    Family History  Problem Relation Age of Onset  . Cancer Father   . Stroke Maternal Aunt     Prior to Admission medications   Medication Sig Start Date End Date Taking? Authorizing Provider  diltiazem (CARDIZEM CD) 180 MG 24 hr capsule Take 1 capsule (180 mg total) by mouth daily. 09/10/16  Yes Tyrone Nine, MD  ferrous sulfate 325 (65 FE) MG EC tablet TAKE ONE TABLET BY MOUTH TWICE DAILY 05/22/16  Yes [name], MD  gabapentin (NEURONTIN) 100 MG capsule TAKE 1 CAPSULE EVERY NIGHT AT BEDTIME 02/17/17  Yes [name], MD  gabapentin (NEURONTIN) 300 MG capsule TAKE 1 CAPSULE THREE TIMES DAILY 02/17/17  Yes [name], MD  lovastatin (MEVACOR) 20 MG tablet Take 1 tablet (20 mg total) at bedtime by mouth. 02/17/17  Yes [name], MD  meclizine (ANTIVERT) 25 MG tablet Take 1 tablet (25 mg total) every 4 (four) hours as needed by mouth for dizziness. 02/17/17  Yes [name], MD  metoprolol tartrate (LOPRESSOR) 25 MG tablet Take 1 tablet (25 mg total) 2 (two) times daily by mouth. 02/15/17 05/16/17 Yes Robbie Lis M, PA-C  oxybutynin (DITROPAN) 5 MG tablet Take 1 tablet (5 mg total) daily by  mouth. 02/17/17  Yes [name], MD  potassium chloride SA (K-DUR,KLOR-CON) 20 MEQ tablet Take 1 tablet (20 mEq total) by mouth as directed. Take 2 tabs (40 meq) in the AM and 1 tab (20 meq) in the PM 08/19/16  Yes Weaver, Lorin Picket T, PA-C  torsemide (DEMADEX) 20 MG tablet Take 1 tablet (20 mg total) by mouth 2 (two) times daily. 12/10/16  Yes [name], MD  warfarin (COUMADIN) 5 MG tablet Take 1 tablet (5 mg total) by mouth daily. Patient taking differently: Take 5 mg daily by mouth. Monday Wednesday Friday. 5 mg all other days 2.5 mg 03/05/16  Yes [name], MD  Physical Exam: Vitals:   02/22/17 0615 02/22/17 0630 02/22/17 0645 02/22/17 0700  BP: 124/76 113/73 123/72 127/84  Pulse: 74 70 70 90  Resp: (!) 32 17 (!) 22 (!) 22  Temp:      TempSrc:      SpO2: 94% 97% 96% 96%  Weight:      Height:         General:  Appears calm and comfortable lying in bed in no acute distress Eyes:  PERRL, EOMI, normal lids, iris ENT:  grossly normal hearing, lips & tongue, mucous membranes of her mouth are moist and pink Neck:  no LAD, masses or thyromegaly Cardiovascular:  Irregularly irregular no m/r/g. 2+ bilateral LE edema. Tenderness to palpation bilaterally Respiratory:  CTA bilaterally, no w/r/r. Normal respiratory effort. Abdomen:  soft, ntnd, obese positive bowel sounds no guarding or rebounding Skin:  no rash or induration seen on limited exam Musculoskeletal:  grossly normal tone BUE/BLE, good ROM, no bony abnormality Psychiatric:  grossly normal mood and affect, speech fluent and appropriate, AOx3 Neurologic:  CN 2-12 grossly intact, moves all extremities in coordinated fashion, sensation intact. Speech clear facial symmetry  Labs on Admission: I have personally reviewed following labs and imaging studies  CBC: Recent Labs  Lab 02/22/17 0345  WBC 6.2  HGB 10.6*  HCT 32.0*  MCV 87.2  PLT 139*   Basic Metabolic Panel: Recent Labs  Lab  02/22/17 0340  NA 140  K 4.4  CL 107  CO2 26  GLUCOSE 102*  BUN 16  CREATININE 1.65*  CALCIUM 8.5*   GFR: Estimated Creatinine Clearance: 36.7 mL/min (A) (by C-G formula based on SCr of 1.65 mg/dL (H)). Liver Function Tests: Recent Labs  Lab 02/22/17 0340  AST 20  ALT 10*  ALKPHOS 90  BILITOT 0.9  PROT 7.4  ALBUMIN 3.2*   No results for input(s): LIPASE, AMYLASE in the last 168 hours. No results for input(s): AMMONIA in the last 168 hours. Coagulation Profile: Recent Labs  Lab 02/17/17 02/22/17 0408  INR 1.6 1.85   Cardiac Enzymes: No results for input(s): CKTOTAL, CKMB, CKMBINDEX, TROPONINI in the last 168 hours. BNP (last 3 results) Recent Labs    04/24/16 1611 07/24/16 1016  PROBNP 669 87.0   HbA1C: No results for input(s): HGBA1C in the last 72 hours. CBG: Recent Labs  Lab 02/22/17 0419  GLUCAP 97   Lipid Profile: No results for input(s): CHOL, HDL, LDLCALC, TRIG, CHOLHDL, LDLDIRECT in the last 72 hours. Thyroid Function Tests: No results for input(s): TSH, T4TOTAL, FREET4, T3FREE, THYROIDAB in the last 72 hours. Anemia Panel: No results for input(s): VITAMINB12, FOLATE, FERRITIN, TIBC, IRON, RETICCTPCT in the last 72 hours. Urine analysis:    Component Value Date/Time   COLORURINE STRAW (A) 02/22/2017 0400   APPEARANCEUR HAZY (A) 02/22/2017 0400   LABSPEC 1.009 02/22/2017 0400   PHURINE 6.0 02/22/2017 0400   GLUCOSEU NEGATIVE 02/22/2017 0400   HGBUR SMALL (A) 02/22/2017 0400   BILIRUBINUR NEGATIVE 02/22/2017 0400   KETONESUR NEGATIVE 02/22/2017 0400   PROTEINUR NEGATIVE 02/22/2017 0400   UROBILINOGEN 1.0 11/29/2013 0520   NITRITE NEGATIVE 02/22/2017 0400   LEUKOCYTESUR MODERATE (A) 02/22/2017 0400    Creatinine Clearance: Estimated Creatinine Clearance: 36.7 mL/min (A) (by C-G formula based on SCr of 1.65 mg/dL (H)).  Sepsis Labs: @LABRCNTIP (procalcitonin:4,lacticidven:4) )No results found for this or any previous visit (from the past  240 hour(s)).   Radiological Exams on Admission: Dg Chest 2 View  Result Date: 02/22/2017  CLINICAL DATA:  Centralized chest pain beginning 1 hour ago. Pitting edema to the lower extremities. History of myocardial infarct, hypertension, pacemaker EXAM: CHEST  2 VIEW COMPARISON:  09/10/2016 FINDINGS: Cardiac pacemaker. Shallow inspiration. Cardiac enlargement with pulmonary vascular congestion. Interstitial pattern to the lung bases suggesting mild edema. No blunting of costophrenic angles. No pneumothorax. Mediastinal contours appear intact. Calcification of the aorta. Thoracolumbar scoliosis. Degenerative changes in the spine and shoulders. Stimulator leads over the lower thoracic region. IMPRESSION: Cardiac enlargement. Pulmonary vascular congestion with mild interstitial edema. Electronically Signed   By: Burman Nieves M.D.   On: 02/22/2017 04:04    EKG: Independently reviewed. Sinus rhythm Borderline left axis deviation Anteroseptal infarct, old  Assessment/Plan Principal Problem:   Chest pain Active Problems:   Permanent atrial fibrillation (HCC)   GERD (gastroesophageal reflux disease)   Right hemiparesis (HCC)   Essential hypertension   HLD (hyperlipidemia)   Diabetes mellitus with complication (HCC)   CKD (chronic kidney disease) stage 3, GFR 30-59 ml/min (HCC)   Physical deconditioning   Chronic diastolic CHF (congestive heart failure) (HCC)   Long term (current) use of anticoagulants   UTI (urinary tract infection)   Obstructive sleep apnea   #1. Chest pain. Some typical and atypical features. Heart score 5. Chart review indicates patient with no known CAD however does have a history of CHF, A. fib and tachybradycardia syndrome status post PPM. LHC in 2001 showed normal coronary arteries. Initial troponin negative. EKG without acute changes. Chest x-ray with vascular congestion and mild interstitial edema as well as cardiac enlargement. BNP on 168. Pain-free on admission.   -Admit to telemetry -Cycle cardiac enzymes -Serial EKG -Aspirin and statin -Lipid panel -GI cocktail -Nothing by mouth until evaluated by cardiology -Cardiology consult. May benefit from stress test either inpatient or out  #2. Urinary tract infection. She is afebrile no leukocytosis nontoxic appearing -Urine culture -Rocephin  #3. Chronic diastolic heart failure. Most recent echo January of this year with an EF of 65-70%. He does have chronic lower extremity edema. BNP 168. Home medications include metoprolol, torsemide -Monitor intake and output -Obtain daily weights -Continue home meds  #4. A. fib/tachybradycardia syndrome status post PPM per Dr. [name]. On Coumadin. INR 1.8. EKG as noted above. -See #1 -Continue Cardizem and metoprolol -Continue Coumadin per pharmacy -Monitor on telemetry  #5. Hypertension. Fair control in the emergency department. -Continue home meds -Monitor  #6. Chronic kidney disease stage III. Creatinine 1.6 on admission. Appears close to baseline -Monitor urine output -recheck in am  #7. Diabetes. Diet controlled. Serum glucose 102 on admission -Carb modified diet  #8. Physical deconditioning.  -PT    DVT prophylaxis: on coumadin Code Status: full  Family Communication: daughter at bedside  Disposition Plan: home  Consults called: cardmaster  Admission status: obs    Gwenyth Bender MD [facility]  If 7PM-7AM, please contact night-coverage [email or web address] Password [employer]  02/22/2017, 7:37 AM

## 2017-02-22 NOTE — ED Notes (Signed)
Meal tray has been ordered. Family at bedside. No needs. Pure wick placed.

## 2017-02-22 NOTE — Progress Notes (Addendum)
   Robin Snow presented for a nuclear stress test today.  No immediate complications.  Stress imaging is pending at this time.  Preliminary EKG findings may be listed in the chart, but the stress test result will not be finalized until perfusion imaging is complete.  [name], PA-C 02/22/2017, 2:21 PM

## 2017-02-22 NOTE — Progress Notes (Signed)
ANTICOAGULATION CONSULT NOTE - Initial Consult  Pharmacy Consult for Coumadin Indication: atrial fibrillation  Allergies  Allergen Reactions  . Aspirin Hives  . Hydrocodone Hives    Tolerates oxycodone and tramadol  . Rofecoxib Hives    Patient Measurements: Height: 5\' 7"  (170.2 cm) Weight: 245 lb (111.1 kg) IBW/kg (Calculated) : 61.6  Vital Signs: Temp: 99.2 F (37.3 C) (11/19 0324) Temp Source: Oral (11/19 0324) BP: 130/87 (11/19 0545) Pulse Rate: 87 (11/19 0545)  Labs: Recent Labs    02/22/17 0340 02/22/17 0345 02/22/17 0408  HGB  --  10.6*  --   HCT  --  32.0*  --   PLT  --  139*  --   LABPROT  --   --  21.2*  INR  --   --  1.85  CREATININE 1.65*  --   --     Estimated Creatinine Clearance: 36.7 mL/min (A) (by C-G formula based on SCr of 1.65 mg/dL (H)).   Medical History: Past Medical History:  Diagnosis Date  . AKI (acute kidney injury) (HCC) 04/2016  . Anemia   . Arthritis   . Chronic atrial fibrillation (HCC)   . Chronic edema   . Chronic venous insufficiency   . CKD (chronic kidney disease), stage III (HCC)   . Coagulopathy (HCC)   . Diabetes mellitus    Type 2  . Diverticulosis   . Dyslipidemia   . GERD (gastroesophageal reflux disease)   . GI bleed 2015   a. lower GI from diverticular source 2015.  [name]. Hx of transfusion of packed red blood cells   . Hypertension   . Morbid obesity (HCC)   . Pulmonary hypertension (HCC)   . Pyelonephritis 04/2016  . Renal infarct (HCC)    a. 04/2016- adm with flank pain, ? pyelo vs renal infarct in setting of recent subtherapeutic INR.  [name]. Sinus bradycardia   . Stroke [facility](HCC) 2015    Assessment: 76yo female c/o centralized CP, to continue Coumadin for Afib w/ h/o stroke during evaluation; current INR slightly below goal.   Goal of Therapy:  INR 2-3   Plan:  Will give boosted Coumadin dose of 7.5mg  and monitor INR for dose adjustments.  Vernard GamblesVeronda Juancarlos Crescenzo, PharmD, BCPS  02/22/2017,6:31 AM

## 2017-02-22 NOTE — ED Triage Notes (Signed)
Pt from home via [facility]. C/o centralized CP x1 hr ago. Bilateral pitting edema to BLE. Hx of MI, HTN & pacemaker. Denies any pain @ this time.

## 2017-02-23 DIAGNOSIS — I130 Hypertensive heart and chronic kidney disease with heart failure and stage 1 through stage 4 chronic kidney disease, or unspecified chronic kidney disease: Secondary | ICD-10-CM | POA: Diagnosis not present

## 2017-02-23 DIAGNOSIS — B9620 Unspecified Escherichia coli [E. coli] as the cause of diseases classified elsewhere: Secondary | ICD-10-CM | POA: Diagnosis not present

## 2017-02-23 DIAGNOSIS — I480 Paroxysmal atrial fibrillation: Secondary | ICD-10-CM | POA: Diagnosis not present

## 2017-02-23 DIAGNOSIS — Z87891 Personal history of nicotine dependence: Secondary | ICD-10-CM | POA: Diagnosis not present

## 2017-02-23 DIAGNOSIS — N183 Chronic kidney disease, stage 3 (moderate): Secondary | ICD-10-CM | POA: Diagnosis not present

## 2017-02-23 DIAGNOSIS — G4733 Obstructive sleep apnea (adult) (pediatric): Secondary | ICD-10-CM | POA: Diagnosis not present

## 2017-02-23 DIAGNOSIS — Z8673 Personal history of transient ischemic attack (TIA), and cerebral infarction without residual deficits: Secondary | ICD-10-CM | POA: Diagnosis not present

## 2017-02-23 DIAGNOSIS — E1122 Type 2 diabetes mellitus with diabetic chronic kidney disease: Secondary | ICD-10-CM | POA: Diagnosis not present

## 2017-02-23 DIAGNOSIS — Z886 Allergy status to analgesic agent status: Secondary | ICD-10-CM | POA: Diagnosis not present

## 2017-02-23 DIAGNOSIS — N390 Urinary tract infection, site not specified: Secondary | ICD-10-CM | POA: Diagnosis not present

## 2017-02-23 DIAGNOSIS — I5032 Chronic diastolic (congestive) heart failure: Secondary | ICD-10-CM | POA: Diagnosis not present

## 2017-02-23 DIAGNOSIS — I482 Chronic atrial fibrillation: Secondary | ICD-10-CM | POA: Diagnosis not present

## 2017-02-23 DIAGNOSIS — Z950 Presence of cardiac pacemaker: Secondary | ICD-10-CM | POA: Diagnosis not present

## 2017-02-23 DIAGNOSIS — E1140 Type 2 diabetes mellitus with diabetic neuropathy, unspecified: Secondary | ICD-10-CM | POA: Diagnosis not present

## 2017-02-23 DIAGNOSIS — I252 Old myocardial infarction: Secondary | ICD-10-CM | POA: Diagnosis not present

## 2017-02-23 DIAGNOSIS — R079 Chest pain, unspecified: Principal | ICD-10-CM

## 2017-02-23 DIAGNOSIS — Z79899 Other long term (current) drug therapy: Secondary | ICD-10-CM | POA: Diagnosis not present

## 2017-02-23 DIAGNOSIS — E785 Hyperlipidemia, unspecified: Secondary | ICD-10-CM | POA: Diagnosis not present

## 2017-02-23 DIAGNOSIS — I272 Pulmonary hypertension, unspecified: Secondary | ICD-10-CM | POA: Diagnosis not present

## 2017-02-23 DIAGNOSIS — Z7901 Long term (current) use of anticoagulants: Secondary | ICD-10-CM | POA: Diagnosis not present

## 2017-02-23 DIAGNOSIS — Z888 Allergy status to other drugs, medicaments and biological substances status: Secondary | ICD-10-CM | POA: Diagnosis not present

## 2017-02-23 DIAGNOSIS — R072 Precordial pain: Secondary | ICD-10-CM

## 2017-02-23 DIAGNOSIS — K219 Gastro-esophageal reflux disease without esophagitis: Secondary | ICD-10-CM | POA: Diagnosis not present

## 2017-02-23 LAB — CUP PACEART REMOTE DEVICE CHECK
Battery Remaining Longevity: 103 mo
Battery Remaining Percentage: 95.5 %
Battery Voltage: 2.99 V
Brady Statistic AS VS Percent: 52 %
Brady Statistic RV Percent Paced: 47 %
Implantable Lead Implant Date: 20180606
Implantable Lead Implant Date: 20180606
Implantable Lead Location: 753859
Implantable Pulse Generator Implant Date: 20180606
Lead Channel Pacing Threshold Amplitude: 0.75 V
Lead Channel Pacing Threshold Pulse Width: 0.5 ms
Lead Channel Sensing Intrinsic Amplitude: 1.2 mV
Lead Channel Setting Pacing Amplitude: 2.5 V
Lead Channel Setting Sensing Sensitivity: 2 mV
MDC IDC LEAD LOCATION: 753860
MDC IDC MSMT LEADCHNL RA IMPEDANCE VALUE: 410 Ohm
MDC IDC MSMT LEADCHNL RA PACING THRESHOLD PULSEWIDTH: 0.5 ms
MDC IDC MSMT LEADCHNL RV IMPEDANCE VALUE: 330 Ohm
MDC IDC MSMT LEADCHNL RV PACING THRESHOLD AMPLITUDE: 0.75 V
MDC IDC MSMT LEADCHNL RV SENSING INTR AMPL: 12 mV
MDC IDC PG SERIAL: 8912837
MDC IDC SESS DTM: 20181119070025
MDC IDC SET LEADCHNL RA PACING AMPLITUDE: 2 V
MDC IDC SET LEADCHNL RV PACING PULSEWIDTH: 0.5 ms
MDC IDC STAT BRADY AP VP PERCENT: 19 %
MDC IDC STAT BRADY AP VS PERCENT: 1 %
MDC IDC STAT BRADY AS VP PERCENT: 28 %
MDC IDC STAT BRADY RA PERCENT PACED: 19 %

## 2017-02-23 LAB — PROTIME-INR
INR: 1.79
Prothrombin Time: 20.6 seconds — ABNORMAL HIGH (ref 11.4–15.2)

## 2017-02-23 MED ORDER — NITROFURANTOIN MONOHYD MACRO 100 MG PO CAPS: 100.0000 mg | ORAL_CAPSULE | Freq: Two times a day (BID) | ORAL | 0 refills | Status: DC

## 2017-02-23 MED ORDER — WARFARIN SODIUM 7.5 MG PO TABS: 7.5000 mg | ORAL_TABLET | Freq: Once | ORAL | Status: DC

## 2017-02-23 NOTE — Progress Notes (Signed)
ANTICOAGULATION CONSULT NOTE Pharmacy Consult for Coumadin Indication: atrial fibrillation  Allergies  Allergen Reactions  . Aspirin Hives  . Hydrocodone Hives    Tolerates oxycodone and tramadol  . Rofecoxib Hives    Patient Measurements: Height: 5\' 7"  (170.2 cm) Weight: 248 lb 1.6 oz (112.5 kg)(scale b) IBW/kg (Calculated) : 61.6  Vital Signs: Temp: 98.2 F (36.8 C) (11/20 0812) Temp Source: Oral (11/20 0812) BP: 144/73 (11/20 0812) Pulse Rate: 78 (11/20 0812)  Labs: Recent Labs    02/22/17 0340 02/22/17 0345 02/22/17 0408 02/22/17 0920 02/22/17 1613 02/23/17 0539  HGB  --  10.6*  --   --   --   --   HCT  --  32.0*  --   --   --   --   PLT  --  139*  --   --   --   --   LABPROT  --   --  21.2*  --   --  20.6*  INR  --   --  1.85  --   --  1.79  CREATININE 1.65*  --   --   --   --   --   TROPONINI  --   --   --  <0.03 <0.03  --     Estimated Creatinine Clearance: 37 mL/min (A) (by C-G formula based on SCr of 1.65 mg/dL (H)).    Assessment: 76yo female c/o centralized CP, to continue Coumadin for Afib w/ h/o stroke during evaluation; current INR slightly below goal at 1.79  Goal of Therapy:  INR 2-3   Plan:  Repeat warfarin 7.5 mg po x 1 today Follow up daily INR Follow up outpt INR scheduled for 12/12  Thank you Okey RegalLisa Azyah Flett, PharmD [phone]    02/23/2017,10:16 AM

## 2017-02-23 NOTE — Discharge Instructions (Signed)
Chest Wall Pain °Chest wall pain is pain in or around the bones and muscles of your chest. Sometimes, an injury causes this pain. Sometimes, the cause may not be known. This pain may take several weeks or longer to get better. °Follow these instructions at home: °Pay attention to any changes in your symptoms. Take these actions to help with your pain: °· Rest as told by your doctor. °· Avoid activities that cause pain. Try not to use your chest, belly (abdominal), or side muscles to lift heavy things. °· If directed, apply ice to the painful area: °? Put ice in a plastic bag. °? Place a towel between your skin and the bag. °? Leave the ice on for 20 minutes, 2-3 times per day. °· Take over-the-counter and prescription medicines only as told by your doctor. °· Do not use tobacco products, including cigarettes, chewing tobacco, and e-cigarettes. If you need help quitting, ask your doctor. °· Keep all follow-up visits as told by your doctor. This is important. ° °Contact a doctor if: °· You have a fever. °· Your chest pain gets worse. °· You have new symptoms. °Get help right away if: °· You feel sick to your stomach (nauseous) or you throw up (vomit). °· You feel sweaty or light-headed. °· You have a cough with phlegm (sputum) or you cough up blood. °· You are short of breath. °This information is not intended to replace advice given to you by your health care provider. Make sure you discuss any questions you have with your health care provider. °Document Released: 09/09/2007 Document Revised: 08/29/2015 Document Reviewed: 06/18/2014 °Elsevier Interactive Patient Education © 2018 [employer] ° °

## 2017-02-23 NOTE — Plan of Care (Signed)
  Pain Managment: General experience of comfort will improve 02/23/2017 0119 - Progressing by Fady Stamps A, RN   Safety: Ability to remain free from injury will improve 02/23/2017 0119 - Progressing by Brittie Whisnant A, RN   Skin Integrity: Risk for impaired skin integrity will decrease 02/23/2017 0119 - Progressing by Monserath Neff, Marlana Salvageonnie A, RN

## 2017-02-23 NOTE — Care Management Obs Status (Signed)
MEDICARE OBSERVATION STATUS NOTIFICATION   Patient Details  Name: Robin Snow MRN: [id number] Date of Birth: 11/06/1940   Medicare Observation Status Notification Given:  Yes    Lawerance Sabalebbie Everly Rubalcava, RN 02/23/2017, 9:22 AM

## 2017-02-23 NOTE — Discharge Summary (Signed)
Physician Discharge Summary  Robin Snow UEA:[id number] DOB: Jul 29, 1940 DOA: 02/22/2017  PCP: Robin Broker, MD  Admit date: 02/22/2017 Discharge date: 02/23/2017  Recommendations for Outpatient Follow-up:  1. Pt will need to follow up with PCP in 2-3 weeks post discharge 2. Please obtain BMP to evaluate electrolytes and kidney function 3. Please also check CBC to evaluate Hg and Hct levels 4. Pt was given script for Nitrofurantoin to complete therapy for UTI treatment   Discharge Diagnoses:  Principal Problem:   Chest pain Active Problems:   Permanent atrial fibrillation (HCC)   GERD (gastroesophageal reflux disease)  Discharge Condition: Stable  Diet recommendation: Heart healthy diet discussed in details   History of present illness:  Pt is 76 yo female with known OSA on CPAP, HTN and PAF on Coumadin, s/p PPM (follows with Dr. [name]), presented for evaluation of left sided chest pain. Pt reported intermittent urinary urgency and occasional dysuria but could not remember for how many days. Cardiology team consulted.   Hospital Course:  Principal Problem:   Chest pain - resolved, per cardiology, stable for discharge and outpatient follow up     OSA  - continue with CPAP use     HTN  - reasonable control - continue Metoprolol and Cardizem    Permanent atrial fibrillation (HCC) - continue coumadin     Chronic diastolic CHF - cont Torsemide     Diabetes mellitus with complication (HCC) of diabetic nephropathy and neuropathy  - has been diet controled  - outpatient follow up     CKD (chronic kidney disease) stage 3, GFR 30-59 ml/min (HCC)    UTI (urinary tract infection) - Cultures positive for E. Coli and pan sensitive - nitrofurantoin provided on discharge for pt to complete therapy    Procedures/Studies: Dg Chest 2 View  Result Date: 02/22/2017 CLINICAL DATA:  Centralized chest pain beginning 1 hour ago. Pitting edema to the lower  extremities. History of myocardial infarct, hypertension, pacemaker EXAM: CHEST  2 VIEW COMPARISON:  09/10/2016 FINDINGS: Cardiac pacemaker. Shallow inspiration. Cardiac enlargement with pulmonary vascular congestion. Interstitial pattern to the lung bases suggesting mild edema. No blunting of costophrenic angles. No pneumothorax. Mediastinal contours appear intact. Calcification of the aorta. Thoracolumbar scoliosis. Degenerative changes in the spine and shoulders. Stimulator leads over the lower thoracic region. IMPRESSION: Cardiac enlargement. Pulmonary vascular congestion with mild interstitial edema. Electronically Signed   By: Burman Nieves M.D.   On: 02/22/2017 04:04   Nm Myocar Multi W/spect W/wall Motion / Ef  Result Date: 02/22/2017 CLINICAL DATA:  Atrial fibrillation. Prior stroke. Diabetes. Hypertension. Hypercholesterolemia and hyperlipidemia. Chronic kidney disease. EXAM: MYOCARDIAL IMAGING WITH SPECT (REST AND PHARMACOLOGIC-STRESS) GATED LEFT VENTRICULAR WALL MOTION STUDY LEFT VENTRICULAR EJECTION FRACTION TECHNIQUE: Standard myocardial SPECT imaging was performed after resting intravenous injection of 10 mCi Tc-78m tetrofosmin. Subsequently, intravenous infusion of Lexiscan was performed under the supervision of the Cardiology staff. At peak effect of the drug, 30 mCi Tc-83m tetrofosmin was injected intravenously and standard myocardial SPECT imaging was performed. Quantitative gated imaging was also performed to evaluate left ventricular wall motion, and estimate left ventricular ejection fraction. COMPARISON:  05/02/2010 FINDINGS: Perfusion: Small matched defect at the cardiac apex could be due to apical thinning but was not present on 05/02/2010 and could alternatively be from a small infarct involving the apex. No inducible ischemia. Wall Motion: Minimal hypokinesis of the inferolateral base. Otherwise normal. Left Ventricular Ejection Fraction: 54 % End diastolic volume 117 ml (mildly  elevated)  End systolic volume 54 ml (mildly elevated) IMPRESSION: 1. Small matched defect at the cardiac apex is new from 2012 in could be from apical thinning or a small remote infarct. No inducible ischemia. 2. Minimal hypokinesis at the inferolateral base. Otherwise normal left ventricular wall motion. 3. Left ventricular ejection fraction 54% 4. Non invasive risk stratification*: Low *2012 Appropriate Use Criteria for Coronary Revascularization Focused Update: J Am Coll Cardiol. 2012;59(9):857-881. http://content.dementiazones.com.aspx?articleid=1201161 Electronically Signed   By: Gaylyn Rong M.D.   On: 02/22/2017 17:15   Discharge Exam: Vitals:   02/23/17 0455 02/23/17 0812  BP: 133/85 (!) 144/73  Pulse: 95 78  Resp: 18 18  Temp: 98.6 F (37 C) 98.2 F (36.8 C)  SpO2: 98% 98%   Vitals:   02/22/17 2105 02/23/17 0036 02/23/17 0455 02/23/17 0812  BP: (!) 153/89 (!) 114/59 133/85 (!) 144/73  Pulse: 100 75 95 78  Resp: 20 18 18 18   Temp: 98.3 F (36.8 C) 98.4 F (36.9 C) 98.6 F (37 C) 98.2 F (36.8 C)  TempSrc: Oral Oral Oral Oral  SpO2: 98% 97% 98% 98%  Weight: 114 kg (251 lb 6.4 oz)  112.5 kg (248 lb 1.6 oz)   Height: 5\' 7"  (1.702 m)       General: Pt is alert, follows commands appropriately, not in acute distress Cardiovascular: Regular rate and rhythm, no rubs, no gallops Respiratory: Clear to auscultation bilaterally, no wheezing, no crackles, no rhonchi Abdominal: Soft, non tender, non distended, bowel sounds +, no guarding  Discharge Instructions  Discharge Instructions    Diet - low sodium heart healthy   Complete by:  As directed    Increase activity slowly   Complete by:  As directed      Allergies as of 02/23/2017      Reactions   Aspirin Hives   Hydrocodone Hives   Tolerates oxycodone and tramadol   Rofecoxib Hives      Medication List    TAKE these medications   diltiazem 180 MG 24 hr capsule Commonly known as:  CARDIZEM CD Take 1  capsule (180 mg total) by mouth daily.   ferrous sulfate 325 (65 FE) MG EC tablet TAKE ONE TABLET BY MOUTH TWICE DAILY   gabapentin 300 MG capsule Commonly known as:  NEURONTIN TAKE 1 CAPSULE THREE TIMES DAILY   gabapentin 100 MG capsule Commonly known as:  NEURONTIN TAKE 1 CAPSULE EVERY NIGHT AT BEDTIME   lovastatin 20 MG tablet Commonly known as:  MEVACOR Take 1 tablet (20 mg total) at bedtime by mouth.   meclizine 25 MG tablet Commonly known as:  ANTIVERT Take 1 tablet (25 mg total) every 4 (four) hours as needed by mouth for dizziness.   metoprolol tartrate 25 MG tablet Commonly known as:  LOPRESSOR Take 1 tablet (25 mg total) 2 (two) times daily by mouth.   nitrofurantoin (macrocrystal-monohydrate) 100 MG capsule Commonly known as:  MACROBID Take 1 capsule (100 mg total) by mouth 2 (two) times daily for 5 days.   oxybutynin 5 MG tablet Commonly known as:  DITROPAN Take 1 tablet (5 mg total) daily by mouth.   potassium chloride SA 20 MEQ tablet Commonly known as:  K-DUR,KLOR-CON Take 1 tablet (20 mEq total) by mouth as directed. Take 2 tabs (40 meq) in the AM and 1 tab (20 meq) in the PM   torsemide 20 MG tablet Commonly known as:  DEMADEX Take 1 tablet (20 mg total) by mouth 2 (two) times daily.  warfarin 5 MG tablet Commonly known as:  COUMADIN Take as directed. If you are unsure how to take this medication, talk to your nurse or doctor. Original instructions:  Take 1 tablet (5 mg total) by mouth daily. What changed:  additional instructions       Follow-up Information    Quintella Reichert, MD Follow up.   Specialty:  Cardiology Why:  Keep follow-up as scheduled 02/24/17 Contact information: [address] [place] [address] [phone]        Robin Broker, MD Follow up.   Specialty:  Internal Medicine Contact information: [address] [place] [address] [phone]           The results of significant  diagnostics from this hospitalization (including imaging, microbiology, ancillary and laboratory) are listed below for reference.     Microbiology: No results found for this or any previous visit (from the past 240 hour(s)).   Labs: Basic Metabolic Panel: Recent Labs  Lab 02/22/17 0340  NA 140  K 4.4  CL 107  CO2 26  GLUCOSE 102*  BUN 16  CREATININE 1.65*  CALCIUM 8.5*   Liver Function Tests: Recent Labs  Lab 02/22/17 0340  AST 20  ALT 10*  ALKPHOS 90  BILITOT 0.9  PROT 7.4  ALBUMIN 3.2*   CBC: Recent Labs  Lab 02/22/17 0345  WBC 6.2  HGB 10.6*  HCT 32.0*  MCV 87.2  PLT 139*   Cardiac Enzymes: Recent Labs  Lab 02/22/17 0920 02/22/17 1613  TROPONINI <0.03 <0.03   BNP: BNP (last 3 results) Recent Labs    02/22/17 0340  BNP 168.8*    ProBNP (last 3 results) Recent Labs    04/24/16 1611 07/24/16 1016  PROBNP 669 87.0    CBG: Recent Labs  Lab 02/22/17 0419  GLUCAP 97   SIGNED: Time coordinating discharge: 60 minutes  Debbora Presto, MD  [facility] 02/23/2017, 10:38 AM Pager [phone]  If 7PM-7AM, please contact night-coverage [email or web address] Password [email or web address]

## 2017-02-23 NOTE — Progress Notes (Signed)
PT Cancellation Note  Patient Details Name: Robin DutyBarbara P Heckstall MRN: [id number] DOB: [birth year]   Cancelled Treatment:    Reason Eval/Treat Not Completed: Fatigue/lethargy limiting ability to participate.  Has discharged planned today but if she remains PT will try again tomorrow.   Ivar DrapeRuth E Jadynn Epping 02/23/2017, 12:15 PM   Samul Dadauth Geary Rufo, PT MS Acute Rehab Dept. Number: [facility] [id number] [phone] and [facility] [phone]

## 2017-02-23 NOTE — Plan of Care (Signed)
Discharge instructions reviewed with patient, questions answered, verbalized understanding.  Awaiting daughter to bring clothes for discharge.

## 2017-02-24 ENCOUNTER — Ambulatory Visit (INDEPENDENT_AMBULATORY_CARE_PROVIDER_SITE_OTHER): Payer: Medicare Other | Admitting: Cardiology

## 2017-02-24 ENCOUNTER — Telehealth: Payer: Self-pay | Admitting: *Deleted

## 2017-02-24 ENCOUNTER — Encounter: Payer: Self-pay | Admitting: Cardiology

## 2017-02-24 ENCOUNTER — Telehealth: Payer: Self-pay

## 2017-02-24 ENCOUNTER — Encounter (INDEPENDENT_AMBULATORY_CARE_PROVIDER_SITE_OTHER): Payer: Self-pay

## 2017-02-24 VITALS — BP 110/70 | HR 102 | Ht 67.0 in

## 2017-02-24 DIAGNOSIS — I10 Essential (primary) hypertension: Secondary | ICD-10-CM | POA: Diagnosis not present

## 2017-02-24 DIAGNOSIS — G4733 Obstructive sleep apnea (adult) (pediatric): Secondary | ICD-10-CM | POA: Diagnosis not present

## 2017-02-24 LAB — URINE CULTURE

## 2017-02-24 NOTE — Telephone Encounter (Signed)
Order sent to [facility] via community message 

## 2017-02-24 NOTE — Patient Instructions (Signed)
Medication Instructions:  Your physician recommends that you continue on your current medications as directed. Please refer to the Current Medication list given to you today.  Labwork: None ordered   Testing/Procedures: None ordered   Follow-Up: You are scheduled to see Ronie Spiesayna Dunn, PA on 03/15/17 at 8:30am.   Your physician wants you to follow-up in: 1 year with Dr. Mayford Knifeurner. You will receive a reminder letter in the mail two months in advance. If you don't receive a letter, please call our office to schedule the follow-up appointment.   Any Other Special Instructions Will Be Listed Below (If Applicable).     If you need a refill on your cardiac medications before your next appointment, please call your pharmacy.

## 2017-02-24 NOTE — Telephone Encounter (Signed)
[facility]

## 2017-02-24 NOTE — Progress Notes (Signed)
Cardiology Office Note:    Date:  02/24/2017   ID:  Helyn AppBarbara P Leisinger, DOB [birth year], MRN [id number]  PCP:  Myrlene Brokerrawford, Elizabeth A, MD  Cardiologist: Verne Carrowhristopher McAlhany, MD   Referring MD: Myrlene Brokerrawford, Elizabeth A, *   Chief Complaint  Patient presents with  . Follow-up    OSA, HTN    History of Present Illness:    [name] is a 76 y.o. female with a hx of OSA on CPAP, HTN and PAF with tachybrady s/p PPM followed by Dr. Ladona Ridgelaylor.  [facility] for CP with nuclear stress test showing a matched defect at the cardiac apex new from 2012 with no ischemia and normal EF at 54%.  She is doing well with her CPAP device.  She tolerates the mask and feels the pressure is adequate.  Since going on CPAP she feels rested in the am but occasionally does nap during the day.  She occasionally has some mild mouth dryness but she uses the humidifier.  She only uses her device 4 hours nightly and then takes it off and is complaining that she wakes up with a headache.   She does not think that he snores.     Past Medical History:  Diagnosis Date  . AKI (acute kidney injury) (HCC) 04/2016  . Anemia   . Arthritis   . Chronic atrial fibrillation (HCC)   . Chronic edema   . Chronic venous insufficiency   . CKD (chronic kidney disease), stage III (HCC)   . Coagulopathy (HCC)   . Diabetes mellitus    Type 2  . Diverticulosis   . Dyslipidemia   . GERD (gastroesophageal reflux disease)   . GI bleed 2015   a. lower GI from diverticular source 2015.  [name]. Hx of transfusion of packed red blood cells   . Hypertension   . Morbid obesity (HCC)   . Obstructive sleep apnea    on CPAP  . Pulmonary hypertension (HCC)   . Pyelonephritis 04/2016  . Renal infarct (HCC)    a. 04/2016- adm with flank pain, ? pyelo vs renal infarct in setting of recent subtherapeutic INR.  [name]. Sinus bradycardia   . Stroke [facility](HCC) 2015    Past Surgical History:  Procedure Laterality  Date  . CESAREAN SECTION    . COLONOSCOPY Left 01/24/2013   Procedure: COLONOSCOPY;  Surgeon: Willis ModenaWilliam Outlaw, MD;  [facility];  Service: Endoscopy;  Laterality: Left;  . ESOPHAGOGASTRODUODENOSCOPY Left 01/23/2013   Procedure: ESOPHAGOGASTRODUODENOSCOPY (EGD);  Surgeon: Willis ModenaWilliam Outlaw, MD;  [facility];  Service: Endoscopy;  Laterality: Left;  . EYE SURGERY Bilateral    cataract surgery  . JOINT REPLACEMENT    . PACEMAKER IMPLANT [name] 09/09/2016   Procedure: Pacemaker Implant;  Surgeon: Marinus Mawaylor, Gregg W, MD;  [facility];  Service: Cardiovascular;  Laterality: [name];  . REPLACEMENT TOTAL KNEE BILATERAL    . TUBAL LIGATION      Current Medications: Current Meds  Medication Sig  . diltiazem (CARDIZEM CD) 180 MG 24 hr capsule Take 1 capsule (180 mg total) by mouth daily.  . ferrous sulfate 325 (65 FE) MG EC tablet TAKE ONE TABLET BY MOUTH TWICE DAILY  . gabapentin (NEURONTIN) 300 MG capsule TAKE 1 CAPSULE THREE TIMES DAILY  . lovastatin (MEVACOR) 20 MG tablet Take 1 tablet (20 mg total) at bedtime by mouth.  . meclizine (ANTIVERT) 25 MG tablet Take 1 tablet (25 mg total) every 4 (  four) hours as needed by mouth for dizziness.  . metoprolol tartrate (LOPRESSOR) 25 MG tablet Take 1 tablet (25 mg total) 2 (two) times daily by mouth.  . potassium chloride SA (K-DUR,KLOR-CON) 20 MEQ tablet Take 1 tablet (20 mEq total) by mouth as directed. Take 2 tabs (40 meq) in the AM and 1 tab (20 meq) in the PM  . torsemide (DEMADEX) 20 MG tablet Take 1 tablet (20 mg total) by mouth 2 (two) times daily.  [name]. warfarin (COUMADIN) 5 MG tablet Take 1 tablet (5 mg total) by mouth daily. (Patient taking differently: Take 5 mg daily by mouth. Monday Wednesday Friday. 5 mg all other days 2.5 mg)  . [DISCONTINUED] gabapentin (NEURONTIN) 100 MG capsule TAKE 1 CAPSULE EVERY NIGHT AT BEDTIME  . [DISCONTINUED] nitrofurantoin, macrocrystal-monohydrate, (MACROBID) 100 MG capsule Take 1 capsule  (100 mg total) by mouth 2 (two) times daily for 5 days.  . [DISCONTINUED] oxybutynin (DITROPAN) 5 MG tablet Take 1 tablet (5 mg total) daily by mouth.     Allergies:   Aspirin; Hydrocodone; and Rofecoxib   Social History   Socioeconomic History  . Marital status: Widowed    Spouse name: None  . Number of children: 4  . Years of education: 9112  . Highest education level: None  Social Needs  . Financial resource strain: None  . Food insecurity - worry: None  . Food insecurity - inability: None  . Transportation needs - medical: None  . Transportation needs - non-medical: None  Occupational History  . None  Tobacco Use  . Smoking status: Former Smoker    Packs/day: 0.12    Years: 34.00    Pack years: 4.08    Types: Cigarettes    Last attempt to quit: 07/07/1993    Years since quitting: 23.6  . Smokeless tobacco: Never Used  Substance and Sexual Activity  . Alcohol use: No    Comment: no longer drinks alcohol  . Drug use: No  . Sexual activity: No  Other Topics Concern  . None  Social History Narrative   Patient is widowed and has 4 living children, and 2 deceased children.   Patient is right handed.   Patient has hs education.   Patient drinks coffee and soda once a week.     Family History: The patient's family history includes Cancer in her father; Stroke in her maternal aunt.  ROS:   Please see the history of present illness.    ROS  All other systems reviewed and negative.   EKGs/Labs/Other Studies Reviewed:    The following studies were reviewed today: CPAP download  EKG:  EKG is not ordered today.    Recent Labs: 07/24/2016: Pro B Natriuretic peptide (BNP) 87.0 09/08/2016: Magnesium 1.8; TSH 3.005 02/22/2017: ALT 10; B Natriuretic Peptide 168.8; BUN 16; Creatinine, Ser 1.65; Hemoglobin 10.6; Platelets 139; Potassium 4.4; Sodium 140   Recent Lipid Panel    Component Value Date/Time   CHOL 146 02/22/2017 0340   TRIG 120 02/22/2017 0340   HDL 28 (L)  02/22/2017 0340   CHOLHDL 5.2 02/22/2017 0340   VLDL 24 02/22/2017 0340   LDLCALC 94 02/22/2017 0340    Physical Exam:    VS:  BP 110/70   Pulse (!) 102   Ht 5\' 7"  (1.702 m)   SpO2 95%   BMI 38.86 kg/m     Wt Readings from Last 3 Encounters:  02/23/17 248 lb 1.6 oz (112.5 kg)  02/15/17 245 lb (111.1 kg)  01/06/17 254 lb (115.2 kg)     GEN:  Well nourished, well developed in no acute distress HEENT: Normal NECK: No JVD; No carotid bruits LYMPHATICS: No lymphadenopathy CARDIAC: RRR, no murmurs, rubs, gallops RESPIRATORY:  Clear to auscultation without rales, wheezing or rhonchi  ABDOMEN: Soft, non-tender, non-distended MUSCULOSKELETAL:  No edema; No deformity  SKIN: Warm and dry NEUROLOGIC:  Alert and oriented x 3 PSYCHIATRIC:  Normal affect   ASSESSMENT:    1. Obstructive sleep apnea   2. Essential hypertension    PLAN:    In order of problems listed above:  1.  OSA - the patient is tolerating PAP therapy well without any problems. The PAP download was reviewed today and showed an AHI of 7.6/hr on 9 cm H2O with 20% compliance in using more than 4 hours nightly.  The patient has been using and benefiting from CPAP use and will continue to benefit from therapy. Her compliance is down because she was hospitalized recently and was not using her own device.  Since she is having am headaches and AHI is elevated I will increase CPAP to 10cm H2O and I have encouraged her to use her CPAP all night.  I will get a download in 4 weeks.    2.  HTN - BP well controlled on exam today.  She will continue on Lopressor 25mg  BID and Cardizem CD 180mg  daily.   She will followup with Dr. Clifton James or Ronie Spies, PA in the next 1-2 weeks for hospital followup for her CP.   Medication Adjustments/Labs and Tests Ordered: Current medicines are reviewed at length with the patient today.  Concerns regarding medicines are outlined above.  No orders of the defined types were placed in this  encounter.  No orders of the defined types were placed in this encounter.   Signed, Armanda Magic, MD  02/24/2017 11:07 AM    [facility]

## 2017-02-24 NOTE — Telephone Encounter (Signed)
Transition Care Management Follow-up Telephone Call   Date discharged? 02/23/2017  How have you been since you were released from the hospital? Pt is feeling better than she did when she was admitted.   Do you understand why you were in the hospital? Pt understands why she was admitted  Do you understand the discharge instructions? Pt states that she understands her discharge instructions.   Where were you discharged to? Pt was release to Home.   Items Reviewed:  Medications reviewed: Yes  Allergies reviewed: Yes  Dietary changes reviewed: Yes  Referrals reviewed: Yes  Functional Questionnaire:  Activities of Daily Living (ADLs): Ambulatory Status: She states at home she uses a walker and a cane. If she leaves the house she is wheelchair-bound due to pain with ambulation. She denies any recent falls. She is fairly independent with ADLs   Any transportation issues/concerns?: Not at this time  Any patient concerns? None at this time  Confirmed importance and date/time of follow-up visits scheduled Yes and patient stated understanding.   Provider Appointment booked with PCP for 03/04/2017 Confirmed with patient if condition begins to worsen call PCP or go to the ER.  Patient was given the office number and encouraged to call back with question or concerns: Patient understands to call us with any questions.

## 2017-02-24 NOTE — Telephone Encounter (Signed)
-----   Message from Donnisha Robertson, RN sent at 02/24/2017 11:27 AM EST ----- Regarding: DME order  DME order placed.   Thanks  Rena  

## 2017-02-24 NOTE — Telephone Encounter (Signed)
-----   Message from Phineas Semenonnisha Robertson, [place] sent at 02/24/2017 11:27 AM EST ----- Regarding: DME order  DME order placed.   Thanks  Sara Leeena

## 2017-02-27 DIAGNOSIS — G4733 Obstructive sleep apnea (adult) (pediatric): Secondary | ICD-10-CM | POA: Diagnosis not present

## 2017-03-01 ENCOUNTER — Encounter: Payer: Self-pay | Admitting: Cardiology

## 2017-03-02 ENCOUNTER — Inpatient Hospital Stay: Payer: Medicare Other | Admitting: Internal Medicine

## 2017-03-04 ENCOUNTER — Encounter: Payer: Self-pay | Admitting: Cardiology

## 2017-03-04 ENCOUNTER — Inpatient Hospital Stay: Payer: Medicare Other | Admitting: Internal Medicine

## 2017-03-12 ENCOUNTER — Ambulatory Visit (INDEPENDENT_AMBULATORY_CARE_PROVIDER_SITE_OTHER): Payer: Medicare Other | Admitting: General Practice

## 2017-03-12 ENCOUNTER — Inpatient Hospital Stay: Payer: Medicare Other | Admitting: Internal Medicine

## 2017-03-12 ENCOUNTER — Encounter: Payer: Self-pay | Admitting: Internal Medicine

## 2017-03-12 ENCOUNTER — Ambulatory Visit (INDEPENDENT_AMBULATORY_CARE_PROVIDER_SITE_OTHER): Payer: Medicare Other | Admitting: Internal Medicine

## 2017-03-12 VITALS — BP 122/82 | HR 60 | Temp 99.2°F | Ht 67.0 in | Wt 263.0 lb

## 2017-03-12 DIAGNOSIS — Z23 Encounter for immunization: Secondary | ICD-10-CM

## 2017-03-12 DIAGNOSIS — N183 Chronic kidney disease, stage 3 unspecified: Secondary | ICD-10-CM

## 2017-03-12 DIAGNOSIS — Z7901 Long term (current) use of anticoagulants: Secondary | ICD-10-CM

## 2017-03-12 DIAGNOSIS — I482 Chronic atrial fibrillation: Secondary | ICD-10-CM | POA: Diagnosis not present

## 2017-03-12 DIAGNOSIS — R072 Precordial pain: Secondary | ICD-10-CM | POA: Diagnosis not present

## 2017-03-12 DIAGNOSIS — E118 Type 2 diabetes mellitus with unspecified complications: Secondary | ICD-10-CM | POA: Diagnosis not present

## 2017-03-12 DIAGNOSIS — I5032 Chronic diastolic (congestive) heart failure: Secondary | ICD-10-CM

## 2017-03-12 DIAGNOSIS — D649 Anemia, unspecified: Secondary | ICD-10-CM | POA: Diagnosis not present

## 2017-03-12 DIAGNOSIS — I4821 Permanent atrial fibrillation: Secondary | ICD-10-CM

## 2017-03-12 LAB — POCT INR: INR: 1.9

## 2017-03-12 NOTE — Assessment & Plan Note (Signed)
Resolved during hospital. Stress test negative. Will follow up with cardiology.

## 2017-03-12 NOTE — Assessment & Plan Note (Signed)
Stable weight and swelling, no flare today.

## 2017-03-12 NOTE — Assessment & Plan Note (Signed)
Foot exam done, controlled by diet. Recent labs without indication for adjustment.

## 2017-03-12 NOTE — Progress Notes (Signed)
   Subjective:    Patient ID: Robin Snow, female    DOB: 07/22/1940, 76 y.o.   MRN: [id number]  HPI The patient is a 76 YO female coming in for hospital follow up (in for chest pain with stress test which was matched to known blockage, resolved during stay and will follow up with cardiology out patient). She is doing better since discharge and denies chest pains since hospital. Denies fevers or chills. Also completed UTI course and feels that this is fully resolved. Activity is fairly normal. Appetite is good. Denies worsening leg swelling. Denies diarrhea or constipation.  [facility], social history reviewed and updated.   Review of Systems  Constitutional: Negative.   HENT: Negative.   Eyes: Negative.   Respiratory: Negative for cough, chest tightness and shortness of breath.   Cardiovascular: Positive for leg swelling. Negative for chest pain and palpitations.  Gastrointestinal: Negative for abdominal distention, abdominal pain, constipation, diarrhea, nausea and vomiting.  Musculoskeletal: Negative.   Skin: Negative.   Neurological: Negative.   Psychiatric/Behavioral: Negative.       Objective:   Physical Exam  Constitutional: She is oriented to person, place, and time. She appears well-developed and well-nourished.  HENT:  Head: Normocephalic and atraumatic.  Eyes: EOM are normal.  Neck: Normal range of motion.  Cardiovascular: Normal rate.  Pulmonary/Chest: Effort normal and breath sounds normal. No respiratory distress. She has no wheezes. She has no rales.  Abdominal: Soft. Bowel sounds are normal. She exhibits no distension. There is no tenderness. There is no rebound.  Musculoskeletal: She exhibits edema.  Stable edema bilaterally  Neurological: She is alert and oriented to person, place, and time. Coordination normal.  Skin: Skin is warm and dry.  Foot exam done  Psychiatric: She has a normal mood and affect.   Vitals:   03/12/17 1030  BP: 122/82  Pulse: 60    Temp: 99.2 F (37.3 C)  TempSrc: Oral  SpO2: 99%  Weight: 263 lb (119.3 kg)  Height: 5\' 7"  (1.702 m)      Assessment & Plan:  Pneumonia 13 given at visit

## 2017-03-12 NOTE — Progress Notes (Signed)
Medical treatment/procedure(s) were performed by non-physician practitioner and as supervising physician I was immediately available for consultation/collaboration. I agree with above. Elizabeth A Crawford, MD  

## 2017-03-12 NOTE — Assessment & Plan Note (Signed)
Stable during recent hospital stay and no reason for repeat today. No signs of bleeding.

## 2017-03-12 NOTE — Patient Instructions (Addendum)
Pre visit review using our clinic review tool, if applicable. No additional management support is needed unless otherwise documented below in the visit note.  Take 1 1/2 tablets today and then continue 1/2 tablet all days except 1 tablet on Monday/Wednesdays and Fridays.  Re-check in 4 weeks.

## 2017-03-12 NOTE — Assessment & Plan Note (Signed)
Stable GFR during most recent labs from the hospital and no indication for repeat today without dosing or medication adjustment.

## 2017-03-13 ENCOUNTER — Other Ambulatory Visit: Payer: Self-pay | Admitting: Internal Medicine

## 2017-03-15 ENCOUNTER — Ambulatory Visit: Payer: Medicare Other | Admitting: Physician Assistant

## 2017-03-17 ENCOUNTER — Other Ambulatory Visit: Payer: Self-pay | Admitting: General Practice

## 2017-03-17 MED ORDER — WARFARIN SODIUM 5 MG PO TABS: ORAL_TABLET | ORAL | 1 refills | Status: DC

## 2017-03-18 ENCOUNTER — Ambulatory Visit: Payer: Medicare Other | Admitting: Physician Assistant

## 2017-03-19 ENCOUNTER — Ambulatory Visit: Payer: Medicare Other

## 2017-03-29 DIAGNOSIS — G4733 Obstructive sleep apnea (adult) (pediatric): Secondary | ICD-10-CM | POA: Diagnosis not present

## 2017-04-01 ENCOUNTER — Other Ambulatory Visit: Payer: Self-pay | Admitting: General Practice

## 2017-04-01 ENCOUNTER — Telehealth: Payer: Self-pay | Admitting: Internal Medicine

## 2017-04-01 MED ORDER — WARFARIN SODIUM 5 MG PO TABS: ORAL_TABLET | ORAL | 1 refills | Status: DC

## 2017-04-01 NOTE — Telephone Encounter (Signed)
Copied from CRM [id number]. Topic: Quick Communication - Rx Refill/Question >> Apr 01, 2017  4:03 PM Anice PaganiniMunoz, Evangaline Jou I, NT wrote: Has the patient contacted their pharmacy yes   (Agent: If no, request that the patient contact the pharmacy for the refill Coumadin 50MG    Preferred Pharmacy (with phone number or street name [facility] [address] [phone]   Agent: Please be advised that RX refills may take up to 3 business days. We ask that you follow-up with your pharmacy.

## 2017-04-01 NOTE — Telephone Encounter (Signed)
Forward msg to cynthia for coumadin renewal.../lmb

## 2017-04-01 NOTE — Telephone Encounter (Signed)
This was sent on 12/12 .  I re-sent today, 12/27.

## 2017-04-09 ENCOUNTER — Ambulatory Visit (INDEPENDENT_AMBULATORY_CARE_PROVIDER_SITE_OTHER): Payer: Medicare Other | Admitting: General Practice

## 2017-04-09 DIAGNOSIS — Z7901 Long term (current) use of anticoagulants: Secondary | ICD-10-CM | POA: Diagnosis not present

## 2017-04-09 DIAGNOSIS — I4821 Permanent atrial fibrillation: Secondary | ICD-10-CM

## 2017-04-09 DIAGNOSIS — I482 Chronic atrial fibrillation: Secondary | ICD-10-CM

## 2017-04-09 LAB — POCT INR: INR: 1.3

## 2017-04-09 NOTE — Progress Notes (Signed)
Medical treatment/procedure(s) were performed by non-physician practitioner and as supervising physician I was immediately available for consultation/collaboration. I agree with above. Lakasha Mcfall A Felix Meras, MD  

## 2017-04-09 NOTE — Patient Instructions (Addendum)
Pre visit review using our clinic review tool, if applicable. No additional management support is needed unless otherwise documented below in the visit note.  Take 1 1/2 tablets today and take 1 tablet tomorrow (1/4 and 1/5) and then continue 1/2 tablet all days except 1 tablet on Monday/Wednesdays and Fridays.  Re-check in 2 weeks.

## 2017-04-19 DIAGNOSIS — G4733 Obstructive sleep apnea (adult) (pediatric): Secondary | ICD-10-CM | POA: Diagnosis not present

## 2017-04-19 DIAGNOSIS — I480 Paroxysmal atrial fibrillation: Secondary | ICD-10-CM | POA: Diagnosis not present

## 2017-04-19 DIAGNOSIS — I872 Venous insufficiency (chronic) (peripheral): Secondary | ICD-10-CM | POA: Diagnosis not present

## 2017-04-23 ENCOUNTER — Ambulatory Visit: Payer: Medicare Other

## 2017-04-30 ENCOUNTER — Ambulatory Visit (INDEPENDENT_AMBULATORY_CARE_PROVIDER_SITE_OTHER): Payer: Medicare Other | Admitting: General Practice

## 2017-04-30 DIAGNOSIS — Z7901 Long term (current) use of anticoagulants: Secondary | ICD-10-CM

## 2017-04-30 DIAGNOSIS — I482 Chronic atrial fibrillation: Secondary | ICD-10-CM

## 2017-04-30 DIAGNOSIS — I4821 Permanent atrial fibrillation: Secondary | ICD-10-CM

## 2017-04-30 LAB — POCT INR: INR: 1.3

## 2017-04-30 NOTE — Progress Notes (Signed)
Medical treatment/procedure(s) were performed by non-physician practitioner and as supervising physician I was immediately available for consultation/collaboration. I agree with above. Elizabeth A Crawford, MD  

## 2017-04-30 NOTE — Patient Instructions (Addendum)
Pre visit review using our clinic review tool, if applicable. No additional management support is needed unless otherwise documented below in the visit note.  Take 1 1/2 tablets today(1/25) and take 1 tablet tomorrow (1/26)  and then change dosage and take 1 tablet daily except 1/2 tablet on Tuesdays and Saturdays.  Re-check in 1 to 2 weeks.

## 2017-05-14 ENCOUNTER — Ambulatory Visit (INDEPENDENT_AMBULATORY_CARE_PROVIDER_SITE_OTHER): Payer: Medicare Other | Admitting: General Practice

## 2017-05-14 DIAGNOSIS — I4821 Permanent atrial fibrillation: Secondary | ICD-10-CM

## 2017-05-14 DIAGNOSIS — Z7901 Long term (current) use of anticoagulants: Secondary | ICD-10-CM

## 2017-05-14 DIAGNOSIS — I482 Chronic atrial fibrillation: Secondary | ICD-10-CM

## 2017-05-14 LAB — POCT INR: INR: 2.2

## 2017-05-14 NOTE — Progress Notes (Signed)
Medical treatment/procedure(s) were performed by non-physician practitioner and as supervising physician I was immediately available for consultation/collaboration. I agree with above. Elizabeth A Crawford, MD  

## 2017-05-14 NOTE — Patient Instructions (Addendum)
Pre visit review using our clinic review tool, if applicable. No additional management support is needed unless otherwise documented below in the visit note.  Continue to take 1 tablet daily except 1/2 tablet on Tuesdays and Saturdays.  Re-check in 4 weeks.

## 2017-05-24 ENCOUNTER — Ambulatory Visit (INDEPENDENT_AMBULATORY_CARE_PROVIDER_SITE_OTHER): Payer: Medicare Other | Admitting: *Deleted

## 2017-05-24 DIAGNOSIS — I495 Sick sinus syndrome: Secondary | ICD-10-CM | POA: Diagnosis not present

## 2017-05-24 NOTE — Progress Notes (Signed)
Remote pacemaker transmission.   

## 2017-05-25 ENCOUNTER — Ambulatory Visit: Payer: Medicare Other | Admitting: Cardiology

## 2017-05-25 LAB — CUP PACEART REMOTE DEVICE CHECK
Battery Voltage: 3.01 V
Brady Statistic AP VP Percent: 35 %
Brady Statistic RA Percent Paced: 35 %
Brady Statistic RV Percent Paced: 62 %
Implantable Lead Implant Date: 20180606
Implantable Lead Location: 753859
Implantable Lead Model: 3830
Implantable Pulse Generator Implant Date: 20180606
Lead Channel Impedance Value: 350 Ohm
Lead Channel Pacing Threshold Amplitude: 0.75 V
Lead Channel Pacing Threshold Pulse Width: 0.5 ms
Lead Channel Setting Sensing Sensitivity: 2 mV
MDC IDC LEAD IMPLANT DT: 20180606
MDC IDC LEAD LOCATION: 753860
MDC IDC MSMT BATTERY REMAINING LONGEVITY: 98 mo
MDC IDC MSMT BATTERY REMAINING PERCENTAGE: 95.5 %
MDC IDC MSMT LEADCHNL RA IMPEDANCE VALUE: 430 Ohm
MDC IDC MSMT LEADCHNL RA SENSING INTR AMPL: 0.8 mV
MDC IDC MSMT LEADCHNL RV PACING THRESHOLD AMPLITUDE: 0.75 V
MDC IDC MSMT LEADCHNL RV PACING THRESHOLD PULSEWIDTH: 0.5 ms
MDC IDC MSMT LEADCHNL RV SENSING INTR AMPL: 12 mV
MDC IDC SESS DTM: 20190217074922
MDC IDC SET LEADCHNL RA PACING AMPLITUDE: 2 V
MDC IDC SET LEADCHNL RV PACING AMPLITUDE: 2.5 V
MDC IDC SET LEADCHNL RV PACING PULSEWIDTH: 0.5 ms
MDC IDC STAT BRADY AP VS PERCENT: 1 %
MDC IDC STAT BRADY AS VP PERCENT: 27 %
MDC IDC STAT BRADY AS VS PERCENT: 37 %
Pulse Gen Model: 2272
Pulse Gen Serial Number: 8912837

## 2017-05-26 ENCOUNTER — Encounter: Payer: Self-pay | Admitting: Cardiology

## 2017-05-28 NOTE — Telephone Encounter (Signed)
LMTCB

## 2017-05-28 NOTE — Telephone Encounter (Deleted)
4 wk [employer] printed

## 2017-05-31 DIAGNOSIS — M25561 Pain in right knee: Secondary | ICD-10-CM | POA: Diagnosis not present

## 2017-06-03 DIAGNOSIS — Z96651 Presence of right artificial knee joint: Secondary | ICD-10-CM | POA: Diagnosis not present

## 2017-06-03 DIAGNOSIS — M25561 Pain in right knee: Secondary | ICD-10-CM | POA: Diagnosis not present

## 2017-06-08 ENCOUNTER — Other Ambulatory Visit: Payer: Self-pay | Admitting: Orthopedic Surgery

## 2017-06-08 DIAGNOSIS — M25561 Pain in right knee: Secondary | ICD-10-CM

## 2017-06-09 ENCOUNTER — Ambulatory Visit
Admission: RE | Admit: 2017-06-09 | Discharge: 2017-06-09 | Disposition: A | Discharge status: Home / Self Care | Payer: Medicare Other | Source: Ambulatory Visit | Attending: Orthopedic Surgery | Admitting: Orthopedic Surgery

## 2017-06-09 DIAGNOSIS — Z96651 Presence of right artificial knee joint: Secondary | ICD-10-CM | POA: Diagnosis not present

## 2017-06-09 DIAGNOSIS — M25561 Pain in right knee: Secondary | ICD-10-CM

## 2017-06-09 DIAGNOSIS — Z471 Aftercare following joint replacement surgery: Secondary | ICD-10-CM | POA: Diagnosis not present

## 2017-06-11 ENCOUNTER — Ambulatory Visit: Payer: Medicare Other

## 2017-06-23 DIAGNOSIS — Z09 Encounter for follow-up examination after completed treatment for conditions other than malignant neoplasm: Secondary | ICD-10-CM | POA: Diagnosis not present

## 2017-06-23 DIAGNOSIS — M25561 Pain in right knee: Secondary | ICD-10-CM | POA: Diagnosis not present

## 2017-06-23 DIAGNOSIS — Z96651 Presence of right artificial knee joint: Secondary | ICD-10-CM | POA: Diagnosis not present

## 2017-07-05 DIAGNOSIS — M25561 Pain in right knee: Secondary | ICD-10-CM | POA: Diagnosis not present

## 2017-07-05 DIAGNOSIS — T8484XA Pain due to internal orthopedic prosthetic devices, implants and grafts, initial encounter: Secondary | ICD-10-CM | POA: Diagnosis not present

## 2017-07-05 DIAGNOSIS — Z96651 Presence of right artificial knee joint: Secondary | ICD-10-CM | POA: Diagnosis not present

## 2017-07-05 DIAGNOSIS — M545 Low back pain: Secondary | ICD-10-CM | POA: Diagnosis not present

## 2017-07-06 DIAGNOSIS — M25561 Pain in right knee: Secondary | ICD-10-CM | POA: Diagnosis not present

## 2017-07-09 ENCOUNTER — Ambulatory Visit (INDEPENDENT_AMBULATORY_CARE_PROVIDER_SITE_OTHER): Payer: Medicare Other | Admitting: General Practice

## 2017-07-09 DIAGNOSIS — I482 Chronic atrial fibrillation: Secondary | ICD-10-CM

## 2017-07-09 DIAGNOSIS — Z7901 Long term (current) use of anticoagulants: Secondary | ICD-10-CM

## 2017-07-09 DIAGNOSIS — I4821 Permanent atrial fibrillation: Secondary | ICD-10-CM

## 2017-07-09 LAB — POCT INR: INR: 1.2

## 2017-07-09 NOTE — Patient Instructions (Addendum)
Pre visit review using our clinic review tool, if applicable. No additional management support is needed unless otherwise documented below in the visit note.  Take 1 1/2 tablets today and tomorrow and then on Sunday start taking 1 tablet daily except  1/2 tablet on Tuesdays and Saturdays.  Re-check in 1 week.

## 2017-07-15 ENCOUNTER — Ambulatory Visit: Payer: Medicare Other | Admitting: Cardiology

## 2017-07-16 ENCOUNTER — Ambulatory Visit (INDEPENDENT_AMBULATORY_CARE_PROVIDER_SITE_OTHER): Payer: Medicare Other | Admitting: General Practice

## 2017-07-16 DIAGNOSIS — I482 Chronic atrial fibrillation: Secondary | ICD-10-CM | POA: Diagnosis not present

## 2017-07-16 DIAGNOSIS — Z7901 Long term (current) use of anticoagulants: Secondary | ICD-10-CM | POA: Diagnosis not present

## 2017-07-16 DIAGNOSIS — I4821 Permanent atrial fibrillation: Secondary | ICD-10-CM

## 2017-07-16 LAB — POCT INR: INR: 3.3

## 2017-07-16 NOTE — Progress Notes (Signed)
Medical treatment/procedure(s) were performed by non-physician practitioner and as supervising physician I was immediately available for consultation/collaboration. I agree with above. Storey Stangeland A Aniyia Rane, MD  

## 2017-07-16 NOTE — Patient Instructions (Addendum)
Pre visit review using our clinic review tool, if applicable. No additional management support is needed unless otherwise documented below in the visit note. Skip coumadin today and then take 1 tablet daily except  1/2 tablet on Tuesdays and Saturdays.  Re-check in 4 weeks.

## 2017-07-19 ENCOUNTER — Ambulatory Visit: Payer: Medicare Other | Admitting: Cardiology

## 2017-07-22 ENCOUNTER — Ambulatory Visit: Payer: Medicare Other | Admitting: Cardiology

## 2017-07-22 ENCOUNTER — Encounter: Payer: Self-pay | Admitting: Cardiology

## 2017-07-22 ENCOUNTER — Encounter: Payer: Self-pay | Admitting: *Deleted

## 2017-07-22 VITALS — BP 122/80 | HR 68 | Ht 67.0 in | Wt 256.4 lb

## 2017-07-22 DIAGNOSIS — Z01818 Encounter for other preprocedural examination: Secondary | ICD-10-CM

## 2017-07-22 NOTE — Progress Notes (Signed)
07/22/2017 Robin Snow   07-06-1940  [id number]  Primary Physician [name], MD Primary Cardiologist: Dr. Clifton James Electrophysiologist: Dr. [name] Sleep Clinic: Dr. Mayford Knife  Reason for Visit/CC: F/u for PAF and HTN; also needs clearance for Knee Surgery  HPI:  Robin Snow is a 77 y.o. female who is being seen today for routine cardiac follow-up. She notes that she also needs cardiac clearance for upcomming knee surgery, to be done in [place]. She has severe knee OA.   Her PMH is significant for permanent atrial fibrillation and long posttermination pauses, s/p PPM followed by Dr. [name]. She also has a h/o stroke, HTN, CKD, chronic venous insuffiencey, DM and OSA. She is on CPAP therapy and followed by Dr. Mayford Knife in sleep clinic. Dr. Clifton James is her primary cardiologist. She has no known h/o CAD. She had a LHC in 2001 that showed normal coronary arteries. She is on chronic coumadin therapy for PAF and h/o stroke. Her INRs are followed by her PCP, Dr. Okey Dupre. Her PCP also follows her cholesterol and basic labs. Most recently, in November 2018, pt had a stress test for atypical CP that showed a small matched defect at the cardiac apex, new from 2012, which was felt to be from apical thinning or a small infarct. No inducible ischemia was noted. EF was normal at 54%.  She reports that she has done well from a cardiac standpoint. She denies CP. No dyspnea. No exertional symptoms. She reports full med compliance. BP is well controlled today at 122/80. Her only issue is chronic right knee pain.    Current Meds  Medication Sig  . diltiazem (CARDIZEM CD) 180 MG 24 hr capsule Take 1 capsule (180 mg total) by mouth daily.  . ferrous sulfate 325 (65 FE) MG EC tablet TAKE ONE TABLET BY MOUTH TWICE DAILY  . gabapentin (NEURONTIN) 300 MG capsule TAKE 1 CAPSULE THREE TIMES DAILY  . lovastatin (MEVACOR) 20 MG tablet Take 1 tablet (20 mg total) at bedtime by mouth.  . meclizine  (ANTIVERT) 25 MG tablet Take 1 tablet (25 mg total) every 4 (four) hours as needed by mouth for dizziness.  . potassium chloride SA (K-DUR,KLOR-CON) 20 MEQ tablet Take 1 tablet (20 mEq total) by mouth as directed. Take 2 tabs (40 meq) in the AM and 1 tab (20 meq) in the PM  . torsemide (DEMADEX) 20 MG tablet Take 1 tablet (20 mg total) by mouth 2 (two) times daily.  [name] warfarin (COUMADIN) 5 MG tablet Take 1 tablet Mon/Wed/Fridays and 1/2 tablet all other days OR AS DIRECTED - 90 day   Allergies  Allergen Reactions  . Aspirin Hives  . Rofecoxib Hives  . Hydrocodone Hives    Tolerates oxycodone and tramadol   Past Medical History:  Diagnosis Date  . AKI (acute kidney injury) (HCC) 04/2016  . Anemia   . Arthritis   . Chronic atrial fibrillation (HCC)   . Chronic edema   . Chronic venous insufficiency   . CKD (chronic kidney disease), stage III (HCC)   . Coagulopathy (HCC)   . Diabetes mellitus    Type 2  . Diverticulosis   . Dyslipidemia   . GERD (gastroesophageal reflux disease)   . GI bleed 2015   a. lower GI from diverticular source 2015.  [name] Hx of transfusion of packed red blood cells   . Hypertension   . Morbid obesity (HCC)   . Obstructive sleep apnea    on CPAP  .  Pulmonary hypertension (HCC)   . Pyelonephritis 04/2016  . Renal infarct (HCC)    a. 04/2016- adm with flank pain, ? pyelo vs renal infarct in setting of recent subtherapeutic INR.  [name] Sinus bradycardia   . Stroke [facility]) 2015   Family History  Problem Relation Age of Onset  . Cancer Father   . Stroke Maternal Aunt    Past Surgical History:  Procedure Laterality Date  . CESAREAN SECTION    . COLONOSCOPY Left 01/24/2013   Procedure: COLONOSCOPY;  Surgeon: Willis Modena, MD;  Location: [facility] ENDOSCOPY;  Service: Endoscopy;  Laterality: Left;  . ESOPHAGOGASTRODUODENOSCOPY Left 01/23/2013   Procedure: ESOPHAGOGASTRODUODENOSCOPY (EGD);  Surgeon: Willis Modena, MD;  Location: [facility] ENDOSCOPY;  Service: Endoscopy;   Laterality: Left;  . EYE SURGERY Bilateral    cataract surgery  . JOINT REPLACEMENT    . PACEMAKER IMPLANT [name] 09/09/2016   Procedure: Pacemaker Implant;  Surgeon: Marinus Maw, MD;  Location: [facility] INVASIVE CV LAB;  Service: Cardiovascular;  Laterality: [name];  . REPLACEMENT TOTAL KNEE BILATERAL    . TUBAL LIGATION     Social History   Socioeconomic History  . Marital status: Widowed    Spouse name: Not on file  . Number of children: 4  . Years of education: 75  . Highest education level: Not on file  Occupational History  . Not on file  Social Needs  . Financial resource strain: Not on file  . Food insecurity:    Worry: Not on file    Inability: Not on file  . Transportation needs:    Medical: Not on file    Non-medical: Not on file  Tobacco Use  . Smoking status: Former Smoker    Packs/day: 0.12    Years: 34.00    Pack years: 4.08    Types: Cigarettes    Last attempt to quit: 07/07/1993    Years since quitting: 24.0  . Smokeless tobacco: Never Used  Substance and Sexual Activity  . Alcohol use: No    Comment: no longer drinks alcohol  . Drug use: No  . Sexual activity: Never  Lifestyle  . Physical activity:    Days per week: Not on file    Minutes per session: Not on file  . Stress: Not on file  Relationships  . Social connections:    Talks on phone: Not on file    Gets together: Not on file    Attends religious service: Not on file    Active member of club or organization: Not on file    Attends meetings of clubs or organizations: Not on file    Relationship status: Not on file  . Intimate partner violence:    Fear of current or ex partner: Not on file    Emotionally abused: Not on file    Physically abused: Not on file    Forced sexual activity: Not on file  Other Topics Concern  . Not on file  Social History Narrative   Patient is widowed and has 4 living children, and 2 deceased children.   Patient is right handed.   Patient has hs education.    Patient drinks coffee and soda once a week.     Review of Systems: General: negative for chills, fever, night sweats or weight changes.  Cardiovascular: negative for chest pain, dyspnea on exertion, edema, orthopnea, palpitations, paroxysmal nocturnal dyspnea or shortness of breath Dermatological: negative for rash Respiratory: negative for cough or wheezing Urologic: negative for hematuria Abdominal:  negative for nausea, vomiting, diarrhea, bright red blood per rectum, melena, or hematemesis Neurologic: negative for visual changes, syncope, or dizziness All other systems reviewed and are otherwise negative except as noted above.   Physical Exam:  Blood pressure 122/80, pulse 68, height 5\' 7"  (1.702 m), weight 256 lb 6.4 oz (116.3 kg), SpO2 97 %.  General appearance: alert, cooperative, no distress and obese, in wheel chair Neck: no carotid bruit and no JVD Lungs: clear to auscultation bilaterally Heart: irregularly irregular rhythm, S1, S2 normal, no murmur, click, rub or gallop Extremities: chronic LEE (chronic venous insuffiency) Pulses: 2+ and symmetric Skin: Skin color, texture, turgor normal. No rashes or lesions Neurologic: Grossly normal  EKG permanent atrial fibrillation, 110 bpm -- personally reviewed   ASSESSMENT AND PLAN:   1. Permanent Atrial Fibrillation: 110 bpm today, but pt has not yet taken AM meds. She will take when she returns home. Continue Cardizem and metoprolol. On chronic coumadin for a/c, followed by PCP.   2. Tachy Brady Syndrome: s/p St Jude PPM, followed by Dr. [name].   3. Chronic Anticoagulation: on coumadin for atrial fibrillation and prior CVA. INRs are followed by her PCP.  Clearance to hold Coumadin and bridging w/ Lovenox will need to come from Dr. Okey Dupre, her PCP   4. HTN: BP is well controlled at 122/80. Continue current regimen.   5. OSA: on CPAP. Followed in Sleep clinic by Dr. Mayford Knife.  6. DLD: followed by PCP. She is on statin  therapy. Most recent LDL < 100 at 94 mg/dl.   7. Chronic Venous Insuffiencey: chronic LEE. We discussed use of compression stockings and elevating lower extremities while at home to help with edema.   8. Preoperative Evaluation: pt needing right knee surgery for severe OA. She had a LHC in 2001 that showed normal coronaries. She had a recent NST 02/2017 which showed a small matched defect at the cardiac apex, new from 2012, which was felt to be from apical thinning or a small infarct, however no inducible ischemia was noted. EF was normal at 54%. Since stress test, she denies any CP or dyspnea. No cardiac symptoms with activity/exertion. EKG shows chronic afib. Her physical exam is benign. No murmurs. No carotid bruits. No further cardiac w/u indicated. Pt can be cleared for knee surgery and is of acceptable risk. Recommend continuation of statin and BB during the perioperative period. Pt's orthopedic MD is in [place]. No clearance was sent via preop pool. Pt was provided a clearance letter today and we will also fax to [facility].    Follow-Up w/ Dr. Clifton James in 6 months. Dr. [name] and Dr. Mayford Knife as directed.   Lyanna Blystone Delmer Islam, MHS [facility] 07/22/2017 10:09 AM

## 2017-07-22 NOTE — Patient Instructions (Addendum)
Medication Instructions:   Your physician recommends that you continue on your current medications as directed. Please refer to the Current Medication list given to you today.   If you need a refill on your cardiac medications before your next appointment, please call your pharmacy.  Labwork: NONE ORDERED  TODAY    Testing/Procedures: NONE ORDERED  TODAY    Follow-Up:  Your physician wants you to follow-up in:  IN  6  MONTHS WITH DR MCALHANY   You will receive a reminder letter in the mail two months in advance. If you don't receive a letter, please call our office to schedule the follow-up appointment.     Any Other Special Instructions Will Be Listed Below (If Applicable).                                                                                                                                                   

## 2017-08-13 ENCOUNTER — Ambulatory Visit (INDEPENDENT_AMBULATORY_CARE_PROVIDER_SITE_OTHER): Payer: Medicare Other | Admitting: General Practice

## 2017-08-13 DIAGNOSIS — Z7901 Long term (current) use of anticoagulants: Secondary | ICD-10-CM | POA: Diagnosis not present

## 2017-08-13 DIAGNOSIS — I482 Chronic atrial fibrillation: Secondary | ICD-10-CM

## 2017-08-13 DIAGNOSIS — I4821 Permanent atrial fibrillation: Secondary | ICD-10-CM

## 2017-08-13 LAB — POCT INR: INR: 4.4

## 2017-08-13 NOTE — Progress Notes (Signed)
Medical treatment/procedure(s) were performed by non-physician practitioner and as supervising physician I was immediately available for consultation/collaboration. I agree with above. Elizabeth A Crawford, MD  

## 2017-08-13 NOTE — Patient Instructions (Addendum)
Pre visit review using our clinic review tool, if applicable. No additional management support is needed unless otherwise documented below in the visit note.  Skip coumadin today and tomorrow (5/10 and 5/11) and then change dosage and take 1 tablet daily except 1/2 tablet on Tuesday, Thursday and Saturdays. Re-check 2 weeks.

## 2017-08-23 ENCOUNTER — Ambulatory Visit (INDEPENDENT_AMBULATORY_CARE_PROVIDER_SITE_OTHER): Payer: Medicare Other | Admitting: *Deleted

## 2017-08-23 DIAGNOSIS — I495 Sick sinus syndrome: Secondary | ICD-10-CM | POA: Diagnosis not present

## 2017-08-23 NOTE — Progress Notes (Signed)
Remote pacemaker transmission.   

## 2017-08-25 LAB — CUP PACEART REMOTE DEVICE CHECK
Battery Remaining Longevity: 96 mo
Brady Statistic AP VS Percent: 1 %
Brady Statistic AS VP Percent: 28 %
Brady Statistic AS VS Percent: 39 %
Date Time Interrogation Session: 20190520143044
Lead Channel Pacing Threshold Pulse Width: 0.5 ms
Lead Channel Pacing Threshold Pulse Width: 0.5 ms
Lead Channel Sensing Intrinsic Amplitude: 12 mV
Lead Channel Setting Pacing Amplitude: 2 V
MDC IDC LEAD IMPLANT DT: 20180606
MDC IDC LEAD IMPLANT DT: 20180606
MDC IDC LEAD LOCATION: 753859
MDC IDC LEAD LOCATION: 753860
MDC IDC MSMT BATTERY REMAINING PERCENTAGE: 95.5 %
MDC IDC MSMT BATTERY VOLTAGE: 2.99 V
MDC IDC MSMT LEADCHNL RA IMPEDANCE VALUE: 400 Ohm
MDC IDC MSMT LEADCHNL RA PACING THRESHOLD AMPLITUDE: 0.75 V
MDC IDC MSMT LEADCHNL RA SENSING INTR AMPL: 0.7 mV
MDC IDC MSMT LEADCHNL RV IMPEDANCE VALUE: 330 Ohm
MDC IDC MSMT LEADCHNL RV PACING THRESHOLD AMPLITUDE: 0.75 V
MDC IDC PG IMPLANT DT: 20180606
MDC IDC SET LEADCHNL RV PACING AMPLITUDE: 2.5 V
MDC IDC SET LEADCHNL RV PACING PULSEWIDTH: 0.5 ms
MDC IDC SET LEADCHNL RV SENSING SENSITIVITY: 2 mV
MDC IDC STAT BRADY AP VP PERCENT: 32 %
MDC IDC STAT BRADY RA PERCENT PACED: 32 %
MDC IDC STAT BRADY RV PERCENT PACED: 60 %
Pulse Gen Model: 2272
Pulse Gen Serial Number: 8912837

## 2017-08-31 ENCOUNTER — Ambulatory Visit: Payer: Medicare Other

## 2017-09-06 ENCOUNTER — Ambulatory Visit (INDEPENDENT_AMBULATORY_CARE_PROVIDER_SITE_OTHER): Payer: Medicare Other | Admitting: Internal Medicine

## 2017-09-06 ENCOUNTER — Encounter: Payer: Self-pay | Admitting: Internal Medicine

## 2017-09-06 ENCOUNTER — Other Ambulatory Visit (INDEPENDENT_AMBULATORY_CARE_PROVIDER_SITE_OTHER): Payer: Medicare Other

## 2017-09-06 VITALS — BP 144/82 | HR 68 | Temp 98.6°F | Ht 67.0 in

## 2017-09-06 DIAGNOSIS — I10 Essential (primary) hypertension: Secondary | ICD-10-CM | POA: Diagnosis not present

## 2017-09-06 DIAGNOSIS — E118 Type 2 diabetes mellitus with unspecified complications: Secondary | ICD-10-CM | POA: Diagnosis not present

## 2017-09-06 DIAGNOSIS — R350 Frequency of micturition: Secondary | ICD-10-CM

## 2017-09-06 LAB — URINALYSIS, ROUTINE W REFLEX MICROSCOPIC
BILIRUBIN URINE: NEGATIVE
Ketones, ur: NEGATIVE
Nitrite: NEGATIVE
Specific Gravity, Urine: 1.02 (ref 1.000–1.030)
Urine Glucose: NEGATIVE
Urobilinogen, UA: 1 (ref 0.0–1.0)
pH: 5.5 (ref 5.0–8.0)

## 2017-09-06 MED ORDER — DILTIAZEM HCL ER COATED BEADS 180 MG PO CP24: 180.0000 mg | ORAL_CAPSULE | Freq: Every day | ORAL | 0 refills | Status: DC

## 2017-09-06 MED ORDER — POTASSIUM CHLORIDE CRYS ER 20 MEQ PO TBCR: 20.0000 meq | EXTENDED_RELEASE_TABLET | ORAL | 0 refills | Status: DC

## 2017-09-06 MED ORDER — LOVASTATIN 20 MG PO TABS: 20.0000 mg | ORAL_TABLET | Freq: Every day | ORAL | 0 refills | Status: DC

## 2017-09-06 MED ORDER — METOPROLOL TARTRATE 25 MG PO TABS: 25.0000 mg | ORAL_TABLET | Freq: Two times a day (BID) | ORAL | 0 refills | Status: DC

## 2017-09-06 MED ORDER — GABAPENTIN 300 MG PO CAPS: ORAL_CAPSULE | ORAL | 0 refills | Status: DC

## 2017-09-06 MED ORDER — MECLIZINE HCL 25 MG PO TABS: 25.0000 mg | ORAL_TABLET | ORAL | 0 refills | Status: DC | PRN

## 2017-09-06 MED ORDER — AMOXICILLIN 500 MG PO CAPS: 1000.0000 mg | ORAL_CAPSULE | Freq: Two times a day (BID) | ORAL | 0 refills | Status: DC

## 2017-09-06 NOTE — Assessment & Plan Note (Signed)
Mild elevated today likely situatinal, o/w stable overall by history and exam, recent data reviewed with pt, and pt to continue medical treatment as before,  to f/u any worsening symptoms or concerns BP Readings from Last 3 Encounters:  09/06/17 (!) 144/82  07/22/17 122/80  03/12/17 122/82

## 2017-09-06 NOTE — Progress Notes (Signed)
Subjective:    Patient ID: Robin Snow, female    DOB: Dec 28, 1940, 77 y.o.   MRN: [id number]  HPI  Here with daughter for support with main c/o 1-2 days onset cold chills, without high fever, ST, cough , and Pt denies chest pain, increased sob or doe, wheezing, orthopnea, PND, increased LE swelling, palpitations, dizziness or syncope.  Denies worsening reflux, abd pain, dysphagia, n/v, bowel change or blood, but has new worsening urinary frequency, but Denies urinary symptoms such as dysuria, urgency, flank pain, hematuria or n/v, fever, chills. Due for July 30 right knee redo TKR.   Pt denies polydipsia, polyuria    Past Medical History:  Diagnosis Date  . AKI (acute kidney injury) (HCC) 04/2016  . Anemia   . Arthritis   . Chronic atrial fibrillation (HCC)   . Chronic edema   . Chronic venous insufficiency   . CKD (chronic kidney disease), stage III (HCC)   . Coagulopathy (HCC)   . Diabetes mellitus    Type 2  . Diverticulosis   . Dyslipidemia   . GERD (gastroesophageal reflux disease)   . GI bleed 2015   a. lower GI from diverticular source 2015.  [name] Hx of transfusion of packed red blood cells   . Hypertension   . Morbid obesity (HCC)   . Obstructive sleep apnea    on CPAP  . Pulmonary hypertension (HCC)   . Pyelonephritis 04/2016  . Renal infarct (HCC)    a. 04/2016- adm with flank pain, ? pyelo vs renal infarct in setting of recent subtherapeutic INR.  [name] Sinus bradycardia   . Stroke [facility]) 2015   Past Surgical History:  Procedure Laterality Date  . CESAREAN SECTION    . COLONOSCOPY Left 01/24/2013   Procedure: COLONOSCOPY;  Surgeon: Willis Modena, MD;  Location: [facility] ENDOSCOPY;  Service: Endoscopy;  Laterality: Left;  . ESOPHAGOGASTRODUODENOSCOPY Left 01/23/2013   Procedure: ESOPHAGOGASTRODUODENOSCOPY (EGD);  Surgeon: Willis Modena, MD;  Location: [facility] ENDOSCOPY;  Service: Endoscopy;  Laterality: Left;  . EYE SURGERY Bilateral    cataract surgery  . JOINT REPLACEMENT     . PACEMAKER IMPLANT [name] 09/09/2016   Procedure: Pacemaker Implant;  Surgeon: Marinus Maw, MD;  Location: [facility] INVASIVE CV LAB;  Service: Cardiovascular;  Laterality: [name];  . REPLACEMENT TOTAL KNEE BILATERAL    . TUBAL LIGATION      reports that she quit smoking about 24 years ago. Her smoking use included cigarettes. She has a 4.08 pack-year smoking history. She has never used smokeless tobacco. She reports that she does not drink alcohol or use drugs. family history includes Cancer in her father; Stroke in her maternal aunt. Allergies  Allergen Reactions  . Aspirin Hives  . Rofecoxib Hives  . Hydrocodone Hives    Tolerates oxycodone and tramadol   Current Outpatient Medications on File Prior to Visit  Medication Sig Dispense Refill  . ferrous sulfate 325 (65 FE) MG EC tablet TAKE ONE TABLET BY MOUTH TWICE DAILY 60 tablet 6  . torsemide (DEMADEX) 20 MG tablet Take 1 tablet (20 mg total) by mouth 2 (two) times daily. 60 tablet 5  . warfarin (COUMADIN) 5 MG tablet Take 1 tablet Mon/Wed/Fridays and 1/2 tablet all other days OR AS DIRECTED - 90 day 90 tablet 1   No current facility-administered medications on file prior to visit.    Review of Systems  Constitutional: Negative for other unusual diaphoresis or sweats HENT: Negative for ear discharge or swelling Eyes: Negative  for other worsening visual disturbances Respiratory: Negative for stridor or other swelling  Gastrointestinal: Negative for worsening distension or other blood Genitourinary: Negative for retention or other urinary change Musculoskeletal: Negative for other MSK pain or swelling Skin: Negative for color change or other new lesions Neurological: Negative for worsening tremors and other numbness  Psychiatric/Behavioral: Negative for worsening agitation or other fatigue All other system neg per pt    Objective:   Physical Exam BP (!) 144/82   Pulse 68   Temp 98.6 F (37 C) (Oral)   Ht 5\' 7"  (1.702 m)   SpO2  98%   BMI 40.16 kg/m  VS noted, mild ill Constitutional: Pt appears in NAD HENT: Head: NCAT.  Right Ear: External ear normal.  Left Ear: External ear normal.  Eyes: . Pupils are equal, round, and reactive to light. Conjunctivae and EOM are normal Nose: without d/c or deformity Neck: Neck supple. Gross normal ROM Cardiovascular: Normal rate and regular rhythm.   Pulmonary/Chest: Effort normal and breath sounds without rales or wheezing.  Abd:  Soft, NT, ND, + BS, no organomegaly Neurological: Pt is alert. At baseline orientation, motor grossly intact Skin: Skin is warm. No rashes, other new lesions, no LE edema Psychiatric: Pt behavior is normal without agitation  No other exam findings  CT KNEE RIGHT WO CONTRAST  06/09/2017 summary only  IMPRESSION: Right total knee arthroplasty with beam hardening artifact resulting from the orthopedic hardware obscuring the adjacent soft tissue and osseous structures. Osteolysis of medial tibial plateau with partial fragmentation of the adjacent cortex. Osteolysis of medial femoral condyle along the anterior aspect with cortical loss. Large joint effusion. 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 There is lucency around the femoral stem component consistent with superimposed loosening. Lucency at the bone arthroplasty interface of the patellar component concerning for loosening.    Assessment & Plan:

## 2017-09-06 NOTE — Assessment & Plan Note (Signed)
Though subtle, suspect UTI - for urine studies and empiric antibx,  to f/u any worsening symptoms or concerns

## 2017-09-06 NOTE — Assessment & Plan Note (Signed)
stable overall by history and exam, recent data reviewed with pt, and pt to continue medical treatment as before,  to f/u any worsening symptoms or concerns Lab Results  Component Value Date   HGBA1C 6.3 (H) 04/13/2016

## 2017-09-06 NOTE — Patient Instructions (Signed)
Please take all new medication as prescribed - the antibiotic  Please continue all other medications as before, and refills have been done if requested.  Please have the pharmacy call with any other refills you may need.  Please keep your appointments with your specialists as you may have planned  Please go to the LAB in the Basement (turn left off the elevator) for the tests to be done today - just the urine testing today  You will be contacted by phone if any changes need to be made immediately.  Otherwise, you will receive a letter about your results with an explanation, but please check with MyChart first.  Please remember to sign up for MyChart if you have not done so, as this will be important to you in the future with finding out test results, communicating by private email, and scheduling acute appointments online when needed.  

## 2017-09-07 LAB — URINE CULTURE
MICRO NUMBER: 90664310
SPECIMEN QUALITY: ADEQUATE

## 2017-09-14 ENCOUNTER — Telehealth: Payer: Self-pay

## 2017-09-14 NOTE — Telephone Encounter (Signed)
Copied from CRM [id number]. Topic: General - Other >> Sep 14, 2017 10:15 AM Gerrianne ScalePayne, Angela L wrote: Reason for CRM: pt daughter Shanda BumpsJessica [phone] calling wanting Dr Okey Duprerawford to Call Dr Valentina LucksGriffin at [phone] because her mother is having surgery  for knee replacement on 11-02-17 and want to know if its ok for her to have anesthesia for surgery

## 2017-09-15 NOTE — Telephone Encounter (Signed)
Can you please schedule patient an appointment. Thank you  

## 2017-09-15 NOTE — Telephone Encounter (Signed)
appt made

## 2017-09-15 NOTE — Telephone Encounter (Signed)
Recommend visit for clearance.

## 2017-09-16 ENCOUNTER — Encounter: Payer: Self-pay | Admitting: Internal Medicine

## 2017-09-16 ENCOUNTER — Ambulatory Visit: Payer: Medicare Other | Admitting: Internal Medicine

## 2017-09-16 VITALS — BP 124/72 | HR 84 | Ht 67.0 in | Wt 251.0 lb

## 2017-09-16 DIAGNOSIS — I495 Sick sinus syndrome: Secondary | ICD-10-CM | POA: Diagnosis not present

## 2017-09-16 DIAGNOSIS — I482 Chronic atrial fibrillation, unspecified: Secondary | ICD-10-CM

## 2017-09-16 DIAGNOSIS — Z950 Presence of cardiac pacemaker: Secondary | ICD-10-CM | POA: Diagnosis not present

## 2017-09-16 LAB — CUP PACEART INCLINIC DEVICE CHECK
Implantable Lead Implant Date: 20180606
Implantable Lead Location: 753859
Implantable Lead Location: 753860
Implantable Pulse Generator Implant Date: 20180606
Lead Channel Setting Pacing Amplitude: 2 V
Lead Channel Setting Pacing Pulse Width: 0.5 ms
Lead Channel Setting Sensing Sensitivity: 2 mV
MDC IDC LEAD IMPLANT DT: 20180606
MDC IDC SESS DTM: 20190613124036
MDC IDC SET LEADCHNL RV PACING AMPLITUDE: 2.5 V
Pulse Gen Serial Number: 8912837

## 2017-09-16 MED ORDER — METOPROLOL TARTRATE 50 MG PO TABS: 50.0000 mg | ORAL_TABLET | Freq: Two times a day (BID) | ORAL | 3 refills | Status: DC

## 2017-09-16 NOTE — Progress Notes (Addendum)
HPI [name] returns today for evaluation of her PPM prior to pending knee surgery. She is a pleasant 77 yo woman with persistent atrial fib, sinus node dysfunction who underwent PPM insertion one year ago. She has had worsening pain in her right leg. She is pending knee surgery which will be in [place]. She is sedentary. She underwent stress testing 6 months ago which demonstrated no ischemia nd preserved LV function. Her stress test was considered low risk. She denies chest pain or sob.  Allergies  Allergen Reactions  . Aspirin Hives  . Rofecoxib Hives  . Hydrocodone Hives    Tolerates oxycodone and tramadol     Current Outpatient Medications  Medication Sig Dispense Refill  . amoxicillin (AMOXIL) 500 MG capsule Take 2 capsules (1,000 mg total) by mouth 2 (two) times daily. 40 capsule 0  . diltiazem (CARDIZEM CD) 180 MG 24 hr capsule Take 1 capsule (180 mg total) by mouth daily. 30 capsule 0  . ferrous sulfate 325 (65 FE) MG EC tablet TAKE ONE TABLET BY MOUTH TWICE DAILY 60 tablet 6  . gabapentin (NEURONTIN) 300 MG capsule TAKE 1 CAPSULE THREE TIMES DAILY 270 capsule 0  . lovastatin (MEVACOR) 20 MG tablet Take 1 tablet (20 mg total) by mouth at bedtime. 30 tablet 0  . meclizine (ANTIVERT) 25 MG tablet Take 1 tablet (25 mg total) by mouth every 4 (four) hours as needed for dizziness. 60 tablet 0  . potassium chloride SA (K-DUR,KLOR-CON) 20 MEQ tablet Take 1 tablet (20 mEq total) by mouth as directed. Take 2 tabs (40 meq) in the AM and 1 tab (20 meq) in the PM 135 tablet 0  . torsemide (DEMADEX) 20 MG tablet Take 1 tablet (20 mg total) by mouth 2 (two) times daily. 60 tablet 5  . warfarin (COUMADIN) 5 MG tablet Take 1 tablet Mon/Wed/Fridays and 1/2 tablet all other days OR AS DIRECTED - 90 day 90 tablet 1  . metoprolol tartrate (LOPRESSOR) 50 MG tablet Take 1 tablet (50 mg total) by mouth 2 (two) times daily. 180 tablet 3   No current facility-administered medications for this  visit.      Past Medical History:  Diagnosis Date  . AKI (acute kidney injury) (HCC) 04/2016  . Anemia   . Arthritis   . Chronic atrial fibrillation (HCC)   . Chronic edema   . Chronic venous insufficiency   . CKD (chronic kidney disease), stage III (HCC)   . Coagulopathy (HCC)   . Diabetes mellitus    Type 2  . Diverticulosis   . Dyslipidemia   . GERD (gastroesophageal reflux disease)   . GI bleed 2015   a. lower GI from diverticular source 2015.  [name] Hx of transfusion of packed red blood cells   . Hypertension   . Morbid obesity (HCC)   . Obstructive sleep apnea    on CPAP  . Pulmonary hypertension (HCC)   . Pyelonephritis 04/2016  . Renal infarct (HCC)    a. 04/2016- adm with flank pain, ? pyelo vs renal infarct in setting of recent subtherapeutic INR.  [name] Sinus bradycardia   . Stroke (HCC) 2015    ROS:   All systems reviewed and negative except as noted in the HPI.   Past Surgical History:  Procedure Laterality Date  . CESAREAN SECTION    . COLONOSCOPY Left 01/24/2013   Procedure: COLONOSCOPY;  Surgeon: Willis Modena, MD;  Location: [facility] ENDOSCOPY;  Service: Endoscopy;  Laterality:  Left;  . ESOPHAGOGASTRODUODENOSCOPY Left 01/23/2013   Procedure: ESOPHAGOGASTRODUODENOSCOPY (EGD);  Surgeon: Willis ModenaWilliam Outlaw, MD;  [facility];  Service: Endoscopy;  Laterality: Left;  . EYE SURGERY Bilateral    cataract surgery  . JOINT REPLACEMENT    . PACEMAKER IMPLANT [name] 09/09/2016   Procedure: Pacemaker Implant;  Surgeon: Marinus Mawaylor, Gregg W, MD;  [facility];  Service: Cardiovascular;  Laterality: [name];  . REPLACEMENT TOTAL KNEE BILATERAL    . TUBAL LIGATION       Family History  Problem Relation Age of Onset  . Cancer Father   . Stroke Maternal Aunt      Social History   Socioeconomic History  . Marital status: Widowed    Spouse name: Not on file  . Number of children: 4  . Years of education: 1212  . Highest education level: Not on file    Occupational History  . Not on file  Social Needs  . Financial resource strain: Not on file  . Food insecurity:    Worry: Not on file    Inability: Not on file  . Transportation needs:    Medical: Not on file    Non-medical: Not on file  Tobacco Use  . Smoking status: Former Smoker    Packs/day: 0.12    Years: 34.00    Pack years: 4.08    Types: Cigarettes    Last attempt to quit: 07/07/1993    Years since quitting: 24.2  . Smokeless tobacco: Never Used  Substance and Sexual Activity  . Alcohol use: No    Comment: no longer drinks alcohol  . Drug use: No  . Sexual activity: Never  Lifestyle  . Physical activity:    Days per week: Not on file    Minutes per session: Not on file  . Stress: Not on file  Relationships  . Social connections:    Talks on phone: Not on file    Gets together: Not on file    Attends religious service: Not on file    Active member of club or organization: Not on file    Attends meetings of clubs or organizations: Not on file    Relationship status: Not on file  . Intimate partner violence:    Fear of current or ex partner: Not on file    Emotionally abused: Not on file    Physically abused: Not on file    Forced sexual activity: Not on file  Other Topics Concern  . Not on file  Social History Narrative   Patient is widowed and has 4 living children, and 2 deceased children.   Patient is right handed.   Patient has hs education.   Patient drinks coffee and soda once a week.     BP 124/72   Pulse 84   Ht 5\' 7"  (1.702 m)   Wt 251 lb (113.9 kg)   BMI 39.31 kg/m   Physical Exam:  Chronically ill appearing 77 yo woman, NAD HEENT: Unremarkable Neck:  7 cm JVD, no thyromegally Lymphatics:  No adenopathy Back:  No CVA tenderness Lungs:  Clear with no wheezes HEART:  Regular rate rhythm, no murmurs, no rubs, no clicks Abd:  soft, positive bowel sounds, no organomegally, no rebound, no guarding Ext:  2 plus pulses, 3+ edema, no  cyanosis, no clubbing Skin:  No rashes no nodules Neuro:  CN II through XII intact, motor grossly intact  DEVICE  Normal device function.  See PaceArt for details.  Assess/Plan: 1. Preoperative eval - with her negative stress test, I think she can proceed with surgery with an acceptable but not insignificant CV risk. Big concern is wound healing.  2. Atrial fib - her ventricular rates are not well controlled. I have asked her to uptitrate her metoprolol to 50 bid 3. PPM - her [employer] DDD PPM is working normally. We will recheck in several months. 4. Chronic diastolic heart failure - her symptoms are class 2. She is sedentary. She is encouraged to reduce her sodium intake.  Leonia Reeves.D.

## 2017-09-16 NOTE — Patient Instructions (Addendum)
Medication Instructions:  Your physician has recommended you make the following change in your medication:  1.  Increase your metoprolol tartrate 25 mg- Take two tablets by mouth twice a day until your current prescription is gone. When your 25 mg tablets are gone-you will change to metoprolol tartrate 50 mg tablets-one tablet by mouth twice a day.  Labwork: None ordered.  Testing/Procedures: None ordered.  Follow-Up: Your physician wants you to follow-up in: one year with Dr. Ladona Ridgelaylor.   You will receive a reminder letter in the mail two months in advance. If you don't receive a letter, please call our office to schedule the follow-up appointment.  Remote monitoring is used to monitor your Pacemaker from home. This monitoring reduces the number of office visits required to check your device to one time per year. It allows us to keep an eye on the functioning of your device to ensure it is working properly. You are scheduled for a device check from home on 11/22/2017. You may send your transmission at any time that day. If you have a wireless device, the transmission will be sent automatically. After your physician reviews your transmission, you will receive a postcard with your next transmission date.  Any Other Special Instructions Will Be Listed Below (If Applicable).  If you need a refill on your cardiac medications before your next appointment, please call your pharmacy.

## 2017-09-23 ENCOUNTER — Encounter: Payer: Self-pay | Admitting: Internal Medicine

## 2017-09-23 ENCOUNTER — Ambulatory Visit (INDEPENDENT_AMBULATORY_CARE_PROVIDER_SITE_OTHER): Payer: Medicare Other | Admitting: Internal Medicine

## 2017-09-23 VITALS — BP 130/90 | HR 102 | Temp 98.8°F | Ht 67.0 in | Wt 260.0 lb

## 2017-09-23 DIAGNOSIS — Z01818 Encounter for other preprocedural examination: Secondary | ICD-10-CM | POA: Diagnosis not present

## 2017-09-23 DIAGNOSIS — I495 Sick sinus syndrome: Secondary | ICD-10-CM | POA: Diagnosis not present

## 2017-09-23 DIAGNOSIS — I10 Essential (primary) hypertension: Secondary | ICD-10-CM

## 2017-09-23 DIAGNOSIS — G8191 Hemiplegia, unspecified affecting right dominant side: Secondary | ICD-10-CM | POA: Diagnosis not present

## 2017-09-23 DIAGNOSIS — E118 Type 2 diabetes mellitus with unspecified complications: Secondary | ICD-10-CM | POA: Diagnosis not present

## 2017-09-23 DIAGNOSIS — I6340 Cerebral infarction due to embolism of unspecified cerebral artery: Secondary | ICD-10-CM | POA: Diagnosis not present

## 2017-09-23 NOTE — Progress Notes (Signed)
   Subjective:    Patient ID: Robin Snow, female    DOB: 02-Nov-1940, 77 y.o.   MRN: [id number]  HPI The patient is a 77 YO female coming in for surgical clearance for repeat knee replacement right knee. She denies chest pains. Has some chronic SOB which is stable. She is a non-smoker. Cannot exercise due to knee pain. This knee pain has been plaguing her for the last 1-2 years and made her gain weight due to inactivity. She has seen cardiology and they increased her beta blocker for better HR control. She has made that change a couple of days ago. Denies constipation or diarrhea or abdominal pain. No rashes.   PM[facility], [facility], social history reviewed and updated.   Review of Systems  Constitutional: Negative.   HENT: Negative.   Eyes: Negative.   Respiratory: Positive for shortness of breath. Negative for cough and chest tightness.   Cardiovascular: Negative for chest pain, palpitations and leg swelling.  Gastrointestinal: Negative for abdominal distention, abdominal pain, constipation, diarrhea, nausea and vomiting.  Musculoskeletal: Positive for arthralgias and gait problem.  Skin: Negative.   Neurological: Negative for dizziness, seizures, speech difficulty, weakness, light-headedness and headaches.  Psychiatric/Behavioral: Negative.       Objective:   Physical Exam  Constitutional: She is oriented to person, place, and time. She appears well-developed and well-nourished.  overweight  HENT:  Head: Normocephalic and atraumatic.  Eyes: EOM are normal.  Neck: Normal range of motion.  Cardiovascular: Normal rate and regular rhythm.  Pulmonary/Chest: Effort normal and breath sounds normal. No respiratory distress. She has no wheezes. She has no rales.  Abdominal: Soft. Bowel sounds are normal. She exhibits no distension. There is no tenderness. There is no rebound.  Musculoskeletal: She exhibits tenderness. She exhibits no edema.  Neurological: She is alert and oriented to person,  place, and time. Coordination abnormal.  wheelchair  Skin: Skin is warm and dry.  Psychiatric: She has a normal mood and affect.   Vitals:   09/23/17 1555  BP: 130/90  Pulse: (!) 102  Temp: 98.8 F (37.1 C)  TempSrc: Oral  SpO2: 99%  Weight: 260 lb (117.9 kg)  Height: 5\' 7"  (1.702 m)      Assessment & Plan:

## 2017-09-23 NOTE — Patient Instructions (Signed)
We will get the letter to the surgeon with the okay to do the surgery.

## 2017-09-24 DIAGNOSIS — Z01818 Encounter for other preprocedural examination: Secondary | ICD-10-CM | POA: Insufficient documentation

## 2017-09-24 NOTE — Assessment & Plan Note (Signed)
Agree with continuing to surgery without further testing. Agree with cardiology in that she is likely medium risk for surgery and risk for stroke off her coumadin which is small but significant and discussed with patient and her family and they wish to proceed as her pain is limiting. Will forward recommendation to proceed with surgery to her surgeon.

## 2017-09-24 NOTE — Assessment & Plan Note (Signed)
HR still fast today, asked her to monitor at home and let us or cardiology know what it is running in several weeks with new metoprolol dosing. Cardiology was hoping to optimize dosing prior to surgery.

## 2017-09-24 NOTE — Assessment & Plan Note (Signed)
Stable since her stroke and has limited her mobility.

## 2017-09-24 NOTE — Assessment & Plan Note (Signed)
Due to a fib. We talked about how there is a small but significant risk of stroke during or after procedure when she stops her coumadin. She will be cleared to stop coumadin 5 days prior and resume after operation.

## 2017-09-24 NOTE — Assessment & Plan Note (Signed)
Stable sugars and diet controlled at this time.

## 2017-09-24 NOTE — Assessment & Plan Note (Signed)
BP at goal on her diltiazem and metoprolol currently. Recent labs okay and will get repeat labs prior to surgery.

## 2017-10-12 ENCOUNTER — Other Ambulatory Visit: Payer: Self-pay | Admitting: Internal Medicine

## 2017-10-18 ENCOUNTER — Ambulatory Visit (INDEPENDENT_AMBULATORY_CARE_PROVIDER_SITE_OTHER): Payer: Medicare Other | Admitting: Internal Medicine

## 2017-10-18 ENCOUNTER — Ambulatory Visit: Payer: Self-pay | Admitting: *Deleted

## 2017-10-18 ENCOUNTER — Encounter: Payer: Self-pay | Admitting: Internal Medicine

## 2017-10-18 VITALS — BP 142/88 | HR 110 | Temp 99.0°F | Ht 67.0 in

## 2017-10-18 DIAGNOSIS — R350 Frequency of micturition: Secondary | ICD-10-CM | POA: Diagnosis not present

## 2017-10-18 LAB — POCT URINALYSIS DIPSTICK
BILIRUBIN UA: NEGATIVE
GLUCOSE UA: NEGATIVE
Ketones, UA: NEGATIVE
Nitrite, UA: POSITIVE
Protein, UA: NEGATIVE
SPEC GRAV UA: 1.02 (ref 1.010–1.025)
Urobilinogen, UA: 0.2 E.U./dL
pH, UA: 6 (ref 5.0–8.0)

## 2017-10-18 MED ORDER — NITROFURANTOIN MONOHYD MACRO 100 MG PO CAPS: 100.0000 mg | ORAL_CAPSULE | Freq: Two times a day (BID) | ORAL | 0 refills | Status: DC

## 2017-10-18 NOTE — Telephone Encounter (Signed)
Do you want patient to make an appointment? 

## 2017-10-18 NOTE — Assessment & Plan Note (Signed)
Could be UTI symtoms, U/A done in the office is consistent. Will treat with macrobid. We talked about how some of this may be related to her torsemide dosing which is a fluid pill and intentionally increases urination.

## 2017-10-18 NOTE — Telephone Encounter (Signed)
Patient has appointment today at 3pm.

## 2017-10-18 NOTE — Progress Notes (Signed)
   Subjective:    Patient ID: Robin Snow, female    DOB: [birth year], 77 y.o.   MRN: [id number]  HPI The patient is a 77 YO female coming in for urinary frequency and urgency. Started about 1-2 weeks ago. Denies fevers or chills. Denies abdominal pain or tenderness. No pain with going. Denies blood in urine.   Review of Systems  Constitutional: Negative.   Respiratory: Negative.   Cardiovascular: Negative.   Gastrointestinal: Negative for abdominal distention, abdominal pain, constipation, diarrhea, nausea and vomiting.  Genitourinary: Positive for frequency and urgency. Negative for difficulty urinating and dysuria.  Musculoskeletal: Positive for arthralgias.  Skin: Negative.       Objective:   Physical Exam  Constitutional: She is oriented to person, place, and time. She appears well-developed and well-nourished.  HENT:  Head: Normocephalic and atraumatic.  Eyes: EOM are normal.  Neck: Normal range of motion.  Cardiovascular: Normal rate and regular rhythm.  Pulmonary/Chest: Effort normal and breath sounds normal. No respiratory distress. She has no wheezes. She has no rales.  Abdominal: Soft. Bowel sounds are normal. She exhibits no distension. There is no tenderness. There is no rebound.  Musculoskeletal: She exhibits tenderness. She exhibits no edema.  Neurological: She is alert and oriented to person, place, and time. Coordination abnormal.  wheelchair  Skin: Skin is warm and dry.  Psychiatric: She has a normal mood and affect.   Vitals:   10/18/17 1439  BP: (!) 142/88  Pulse: (!) 110  Temp: 99 F (37.2 C)  TempSrc: Oral  SpO2: 97%  Height: 5\' 7"  (1.702 m)      Assessment & Plan:

## 2017-10-18 NOTE — Telephone Encounter (Signed)
Having urinary urgency, not burning, no odor. States its a overactive bladder. Requesting something called in? [facility] on [address]     Patient is calling with frequent urination- she states she is not having any other symptoms. Patient states she was diagnosed with over active bladder years ago and given medication for that. Reason for Disposition . [1] Can't control passage of urine (i.e., urinary incontinence) AND [2] new onset (< 2 weeks) or worsening  Answer Assessment - Initial Assessment Questions 1. SYMPTOM: "What's the main symptom you're concerned about?" (e.g., frequency, incontinence)     Frequency of urination- overactive bladder 2. ONSET: "When did the  Over active bladder  start?"     2 weeks ago- patient is going all the time- she is using Depends and padding her bed at night 3. PAIN: "Is there any pain?" If so, ask: "How bad is it?" (Scale: 1-10; mild, moderate, severe)     No pain 4. CAUSE: "What do you think is causing the symptoms?"     Over active bladder 5. OTHER SYMPTOMS: "Do you have any other symptoms?" (e.g., fever, flank pain, blood in urine, pain with urination)     No other symptoms 6. PREGNANCY: "Is there any chance you are pregnant?" "When was your last menstrual period?"     n/a  Protocols used: [facility]

## 2017-10-18 NOTE — Telephone Encounter (Signed)
Up to patient.

## 2017-10-18 NOTE — Patient Instructions (Signed)
We have sent in macrobid for the bladder infection. Take 1 pill twice a day for 1 week.

## 2017-10-20 ENCOUNTER — Telehealth: Payer: Self-pay | Admitting: Internal Medicine

## 2017-10-20 DIAGNOSIS — E119 Type 2 diabetes mellitus without complications: Secondary | ICD-10-CM | POA: Diagnosis not present

## 2017-10-20 DIAGNOSIS — Z96651 Presence of right artificial knee joint: Secondary | ICD-10-CM | POA: Diagnosis not present

## 2017-10-20 DIAGNOSIS — T8484XA Pain due to internal orthopedic prosthetic devices, implants and grafts, initial encounter: Secondary | ICD-10-CM | POA: Diagnosis not present

## 2017-10-20 DIAGNOSIS — Z01818 Encounter for other preprocedural examination: Secondary | ICD-10-CM | POA: Diagnosis not present

## 2017-10-20 DIAGNOSIS — G8929 Other chronic pain: Secondary | ICD-10-CM | POA: Diagnosis not present

## 2017-10-20 DIAGNOSIS — M25561 Pain in right knee: Secondary | ICD-10-CM | POA: Diagnosis not present

## 2017-10-20 NOTE — Telephone Encounter (Signed)
New message       [facility] Pre-operative Risk Assessment    Request for surgical clearance:  1. What type of surgery is being performed?  Revision right knee replacement  2. When is this surgery scheduled? 11/02/2017  3. What type of clearance is required (medical clearance vs. Pharmacy clearance to hold med vs. Both)? Both  4. Are there any medications that need to be held prior to surgery and how long? Coumadin  5. Practice name and name of physician performing surgery? Dr Thomasenia Sales - [facility]  6. What is your office phone number [phone]   7.   What is your office fax number [phone] Attn: Berline Chough  8.   Anesthesia type (None, local, MAC, general) ? [place] 10/20/2017, 2:58 PM  _________________________________________________________________   (provider comments below)

## 2017-10-20 NOTE — Telephone Encounter (Signed)
Pt takes warfarin for afib with CHADS2VASc score of 8 (age x2, sex, HTN, CHF, DM, stroke). She will require Lovenox bridging in order to hold her warfarin for 5 days for knee replacement. Will route to Bailey Mechindy Boyd who manages patient's warfarin as an FYI and to coordinate Lovenox bridge with pt.

## 2017-10-20 NOTE — Telephone Encounter (Signed)
Pt cleared by Dr Ladona Ridgelaylor in June. Will route to pharmacy for formal Coumadin recommendations, then route clearance to her surgeon.   Corine ShelterLUKE Tomia Enlow PA-C 10/20/2017 3:55 PM

## 2017-10-21 NOTE — Telephone Encounter (Signed)
   Primary Cardiologist: Lewayne BuntingGregg Taylor, MD  Chart reviewed as part of pre-operative protocol coverage. Given past medical history and time since last visit, based on ACC/AHA guidelines, Buddy DutyBarbara P Gish would be at acceptable risk for the planned procedure without further cardiovascular testing. This clearance was per Dr. Ladona Ridgelaylor.  See below for coumadin instructions.   I will route this recommendation to the requesting party via Epic fax function and remove from pre-op pool.  Please call with questions.  Nada BoozerLaura Latanja Lehenbauer, NP 10/21/2017, 2:16 PM

## 2017-10-21 NOTE — Telephone Encounter (Signed)
Dr. Okey Duprerawford,  Could you please review these notes and since her INR is managed here at primary care I will just need your approval to dose and call in the Lovenox.  Please advise.  Thanks, Robin Snow

## 2017-10-22 ENCOUNTER — Telehealth: Payer: Self-pay | Admitting: General Practice

## 2017-10-22 ENCOUNTER — Other Ambulatory Visit: Payer: Self-pay | Admitting: General Practice

## 2017-10-22 MED ORDER — ENOXAPARIN SODIUM 120 MG/0.8ML ~~LOC~~ SOLN: 120.0000 mg | Freq: Two times a day (BID) | SUBCUTANEOUS | 0 refills | Status: DC

## 2017-10-22 NOTE — Telephone Encounter (Signed)
Yes, fine to hold coumadin and bridge with lovenox for upcoming surgical procedure.

## 2017-10-22 NOTE — Telephone Encounter (Signed)
Instructions for coumadin and Lovenox pre procedure on 7/30.  7/25 - Last dose of coumadin until after procedure. 7/26 - Nothing (No coumadin or Lovenox) 7/27 - Lovenox in the AM and PM (12 hours apart) 7/28 - Lovenox in the AM and PM 7/29 - Lovenox in the AM ONLY!! (TAKE BY 7AM) 7/30 - DO NOT TAKE LOVENOX TODAY!!  Instructions have been given to patient and patient verbalizes understanding.

## 2017-10-26 ENCOUNTER — Ambulatory Visit (INDEPENDENT_AMBULATORY_CARE_PROVIDER_SITE_OTHER): Payer: Medicare Other | Admitting: General Practice

## 2017-10-26 DIAGNOSIS — I482 Chronic atrial fibrillation: Secondary | ICD-10-CM

## 2017-10-26 DIAGNOSIS — I4821 Permanent atrial fibrillation: Secondary | ICD-10-CM

## 2017-10-26 DIAGNOSIS — Z7901 Long term (current) use of anticoagulants: Secondary | ICD-10-CM

## 2017-10-26 LAB — POCT INR: INR: 5.8 — AB (ref 2.0–3.0)

## 2017-10-26 NOTE — Progress Notes (Signed)
Medical treatment/procedure(s) were performed by non-physician practitioner and as supervising physician I was immediately available for consultation/collaboration. I agree with above. Jazae Gandolfi A Rindi Beechy, MD  

## 2017-10-26 NOTE — Patient Instructions (Addendum)
Pre visit review using our clinic review tool, if applicable. No additional management support is needed unless otherwise documented below in the visit note.     Instructions for coumadin and Lovenox pre procedure on 7/30.  7/23 - Stop coumadin today! 7/24 - Nothing 7/25 - Nothing 7/26 - Nothing 7/27 - Lovenox in the AM and PM (12 hours apart) 7/28 - Lovenox in the AM and PM 7/29 - Lovenox in the AM ONLY!! (TAKE BY 7AM) 7/30 - DO NOT TAKE LOVENOX TODAY!!  Instructions have been given to patient and patient verbalizes understanding.

## 2017-10-27 DIAGNOSIS — T8484XA Pain due to internal orthopedic prosthetic devices, implants and grafts, initial encounter: Secondary | ICD-10-CM | POA: Diagnosis not present

## 2017-10-27 DIAGNOSIS — Z96651 Presence of right artificial knee joint: Secondary | ICD-10-CM | POA: Diagnosis not present

## 2017-10-28 ENCOUNTER — Other Ambulatory Visit: Payer: Self-pay

## 2017-11-02 DIAGNOSIS — K5900 Constipation, unspecified: Secondary | ICD-10-CM | POA: Diagnosis not present

## 2017-11-02 DIAGNOSIS — E119 Type 2 diabetes mellitus without complications: Secondary | ICD-10-CM | POA: Diagnosis not present

## 2017-11-02 DIAGNOSIS — N183 Chronic kidney disease, stage 3 (moderate): Secondary | ICD-10-CM | POA: Diagnosis not present

## 2017-11-02 DIAGNOSIS — I129 Hypertensive chronic kidney disease with stage 1 through stage 4 chronic kidney disease, or unspecified chronic kidney disease: Secondary | ICD-10-CM | POA: Diagnosis not present

## 2017-11-02 DIAGNOSIS — I872 Venous insufficiency (chronic) (peripheral): Secondary | ICD-10-CM | POA: Diagnosis not present

## 2017-11-02 DIAGNOSIS — M25561 Pain in right knee: Secondary | ICD-10-CM | POA: Diagnosis not present

## 2017-11-02 DIAGNOSIS — T84018A Broken internal joint prosthesis, other site, initial encounter: Secondary | ICD-10-CM | POA: Diagnosis not present

## 2017-11-02 DIAGNOSIS — N179 Acute kidney failure, unspecified: Secondary | ICD-10-CM | POA: Diagnosis not present

## 2017-11-02 DIAGNOSIS — R2241 Localized swelling, mass and lump, right lower limb: Secondary | ICD-10-CM | POA: Diagnosis not present

## 2017-11-02 DIAGNOSIS — I252 Old myocardial infarction: Secondary | ICD-10-CM | POA: Diagnosis not present

## 2017-11-02 DIAGNOSIS — I4891 Unspecified atrial fibrillation: Secondary | ICD-10-CM | POA: Diagnosis not present

## 2017-11-02 DIAGNOSIS — T84033A Mechanical loosening of internal left knee prosthetic joint, initial encounter: Secondary | ICD-10-CM | POA: Diagnosis not present

## 2017-11-02 DIAGNOSIS — M549 Dorsalgia, unspecified: Secondary | ICD-10-CM | POA: Diagnosis not present

## 2017-11-02 DIAGNOSIS — M2341 Loose body in knee, right knee: Secondary | ICD-10-CM | POA: Diagnosis not present

## 2017-11-02 DIAGNOSIS — Z471 Aftercare following joint replacement surgery: Secondary | ICD-10-CM | POA: Diagnosis not present

## 2017-11-02 DIAGNOSIS — M6281 Muscle weakness (generalized): Secondary | ICD-10-CM | POA: Diagnosis not present

## 2017-11-02 DIAGNOSIS — G8918 Other acute postprocedural pain: Secondary | ICD-10-CM | POA: Diagnosis not present

## 2017-11-02 DIAGNOSIS — Z7901 Long term (current) use of anticoagulants: Secondary | ICD-10-CM | POA: Diagnosis not present

## 2017-11-02 DIAGNOSIS — K219 Gastro-esophageal reflux disease without esophagitis: Secondary | ICD-10-CM | POA: Diagnosis not present

## 2017-11-02 DIAGNOSIS — E785 Hyperlipidemia, unspecified: Secondary | ICD-10-CM | POA: Diagnosis not present

## 2017-11-02 DIAGNOSIS — R718 Other abnormality of red blood cells: Secondary | ICD-10-CM | POA: Diagnosis not present

## 2017-11-02 DIAGNOSIS — I5032 Chronic diastolic (congestive) heart failure: Secondary | ICD-10-CM | POA: Diagnosis not present

## 2017-11-02 DIAGNOSIS — I2510 Atherosclerotic heart disease of native coronary artery without angina pectoris: Secondary | ICD-10-CM | POA: Diagnosis not present

## 2017-11-02 DIAGNOSIS — E1122 Type 2 diabetes mellitus with diabetic chronic kidney disease: Secondary | ICD-10-CM | POA: Diagnosis not present

## 2017-11-02 DIAGNOSIS — D62 Acute posthemorrhagic anemia: Secondary | ICD-10-CM | POA: Diagnosis not present

## 2017-11-02 DIAGNOSIS — Z96651 Presence of right artificial knee joint: Secondary | ICD-10-CM | POA: Diagnosis not present

## 2017-11-02 DIAGNOSIS — G4733 Obstructive sleep apnea (adult) (pediatric): Secondary | ICD-10-CM | POA: Diagnosis not present

## 2017-11-02 DIAGNOSIS — Z96653 Presence of artificial knee joint, bilateral: Secondary | ICD-10-CM | POA: Diagnosis not present

## 2017-11-02 DIAGNOSIS — G4730 Sleep apnea, unspecified: Secondary | ICD-10-CM | POA: Diagnosis not present

## 2017-11-02 DIAGNOSIS — Z950 Presence of cardiac pacemaker: Secondary | ICD-10-CM | POA: Diagnosis not present

## 2017-11-02 DIAGNOSIS — Z96652 Presence of left artificial knee joint: Secondary | ICD-10-CM | POA: Diagnosis not present

## 2017-11-02 DIAGNOSIS — D631 Anemia in chronic kidney disease: Secondary | ICD-10-CM | POA: Diagnosis not present

## 2017-11-02 DIAGNOSIS — I130 Hypertensive heart and chronic kidney disease with heart failure and stage 1 through stage 4 chronic kidney disease, or unspecified chronic kidney disease: Secondary | ICD-10-CM | POA: Diagnosis not present

## 2017-11-02 DIAGNOSIS — G8929 Other chronic pain: Secondary | ICD-10-CM | POA: Diagnosis not present

## 2017-11-02 DIAGNOSIS — T84032A Mechanical loosening of internal right knee prosthetic joint, initial encounter: Secondary | ICD-10-CM | POA: Diagnosis not present

## 2017-11-02 DIAGNOSIS — I480 Paroxysmal atrial fibrillation: Secondary | ICD-10-CM | POA: Diagnosis not present

## 2017-11-02 DIAGNOSIS — D591 Other autoimmune hemolytic anemias: Secondary | ICD-10-CM | POA: Diagnosis not present

## 2017-11-02 DIAGNOSIS — Z79899 Other long term (current) drug therapy: Secondary | ICD-10-CM | POA: Diagnosis not present

## 2017-11-09 ENCOUNTER — Other Ambulatory Visit: Payer: Self-pay | Admitting: *Deleted

## 2017-11-09 NOTE — Patient Outreach (Signed)
[facility] Care Management  11/09/2017  Robin Snow 1940/09/16 [id number]  RN Health Coach attempted #1 outreach screening call to patient.  Patient was unavailable. HIPPA compliance voicemail message left with return callback number.  Plan: RN will call patient again within 3-5 business  days.  Gean MaidensFrances Floyd Wade BSN RN [facility] Care Management [phone]

## 2017-11-12 ENCOUNTER — Other Ambulatory Visit: Payer: Self-pay | Admitting: *Deleted

## 2017-11-12 NOTE — Patient Outreach (Signed)
[facility] Care Management  11/12/2017  Buddy DutyBarbara P Serfass [birth year] [id number]   RN Health Coach attempted #2 screening outreach call to patient.  Patient was unavailable. HIPPA compliance voicemail message left with return callback number.  Plan: RN will call patient again within 3-5 days.  Gean MaidensFrances Courtny Bennison BSN RN [facility] Care Management [phone]

## 2017-11-13 DIAGNOSIS — Z7952 Long term (current) use of systemic steroids: Secondary | ICD-10-CM | POA: Diagnosis not present

## 2017-11-13 DIAGNOSIS — E1122 Type 2 diabetes mellitus with diabetic chronic kidney disease: Secondary | ICD-10-CM | POA: Diagnosis not present

## 2017-11-13 DIAGNOSIS — N183 Chronic kidney disease, stage 3 (moderate): Secondary | ICD-10-CM | POA: Diagnosis not present

## 2017-11-13 DIAGNOSIS — Z7901 Long term (current) use of anticoagulants: Secondary | ICD-10-CM | POA: Diagnosis not present

## 2017-11-13 DIAGNOSIS — G4733 Obstructive sleep apnea (adult) (pediatric): Secondary | ICD-10-CM | POA: Diagnosis not present

## 2017-11-13 DIAGNOSIS — I480 Paroxysmal atrial fibrillation: Secondary | ICD-10-CM | POA: Diagnosis not present

## 2017-11-13 DIAGNOSIS — I129 Hypertensive chronic kidney disease with stage 1 through stage 4 chronic kidney disease, or unspecified chronic kidney disease: Secondary | ICD-10-CM | POA: Diagnosis not present

## 2017-11-13 DIAGNOSIS — T84032D Mechanical loosening of internal right knee prosthetic joint, subsequent encounter: Secondary | ICD-10-CM | POA: Diagnosis not present

## 2017-11-13 DIAGNOSIS — D631 Anemia in chronic kidney disease: Secondary | ICD-10-CM | POA: Diagnosis not present

## 2017-11-13 DIAGNOSIS — E1140 Type 2 diabetes mellitus with diabetic neuropathy, unspecified: Secondary | ICD-10-CM | POA: Diagnosis not present

## 2017-11-13 DIAGNOSIS — I2510 Atherosclerotic heart disease of native coronary artery without angina pectoris: Secondary | ICD-10-CM | POA: Diagnosis not present

## 2017-11-13 DIAGNOSIS — Z5181 Encounter for therapeutic drug level monitoring: Secondary | ICD-10-CM | POA: Diagnosis not present

## 2017-11-13 MED ORDER — ONDANSETRON HCL 4 MG/2ML IJ SOLN
4.00 | INTRAMUSCULAR | Status: DC
Start: ? — End: 2017-11-13

## 2017-11-13 MED ORDER — METOCLOPRAMIDE HCL 5 MG/ML IJ SOLN
5.00 | INTRAMUSCULAR | Status: DC
Start: ? — End: 2017-11-13

## 2017-11-13 MED ORDER — ZOLPIDEM TARTRATE 5 MG PO TABS
5.00 | ORAL_TABLET | ORAL | Status: DC
Start: ? — End: 2017-11-13

## 2017-11-13 MED ORDER — TORSEMIDE 20 MG PO TABS
20.00 | ORAL_TABLET | ORAL | Status: DC
Start: 2017-11-12 — End: 2017-11-13

## 2017-11-13 MED ORDER — METOCLOPRAMIDE HCL 10 MG PO TABS
5.00 | ORAL_TABLET | ORAL | Status: DC
Start: ? — End: 2017-11-13

## 2017-11-13 MED ORDER — ONDANSETRON HCL 4 MG PO TABS
4.00 | ORAL_TABLET | ORAL | Status: DC
Start: ? — End: 2017-11-13

## 2017-11-13 MED ORDER — SODIUM CHLORIDE 0.9 % IV SOLN
10.00 | INTRAVENOUS | Status: DC
Start: ? — End: 2017-11-13

## 2017-11-13 MED ORDER — GENERIC EXTERNAL MEDICATION
0.08 | Status: DC
Start: ? — End: 2017-11-13

## 2017-11-13 MED ORDER — PRAVASTATIN SODIUM 20 MG PO TABS
20.00 | ORAL_TABLET | ORAL | Status: DC
Start: 2017-11-11 — End: 2017-11-13

## 2017-11-13 MED ORDER — DOCUSATE SODIUM 100 MG PO CAPS
200.00 | ORAL_CAPSULE | ORAL | Status: DC
Start: 2017-11-12 — End: 2017-11-13

## 2017-11-13 MED ORDER — HYDROCODONE-ACETAMINOPHEN 5-325 MG PO TABS
1.00 | ORAL_TABLET | ORAL | Status: DC
Start: ? — End: 2017-11-13

## 2017-11-13 MED ORDER — BISACODYL 5 MG PO TBEC
5.00 | DELAYED_RELEASE_TABLET | ORAL | Status: DC
Start: ? — End: 2017-11-13

## 2017-11-13 MED ORDER — ALUM & MAG HYDROXIDE-SIMETH 200-200-20 MG/5ML PO SUSP
30.00 | ORAL | Status: DC
Start: ? — End: 2017-11-13

## 2017-11-13 MED ORDER — DIPHENHYDRAMINE HCL 25 MG PO CAPS
25.00 | ORAL_CAPSULE | ORAL | Status: DC
Start: ? — End: 2017-11-13

## 2017-11-13 MED ORDER — GENERIC EXTERNAL MEDICATION
10.00 | Status: DC
Start: ? — End: 2017-11-13

## 2017-11-13 MED ORDER — GENERIC EXTERNAL MEDICATION
80.00 | Status: DC
Start: 2017-11-12 — End: 2017-11-13

## 2017-11-13 MED ORDER — GENERIC EXTERNAL MEDICATION
0.04 | Status: DC
Start: ? — End: 2017-11-13

## 2017-11-13 MED ORDER — FERROUS SULFATE 325 (65 FE) MG PO TABS
325.00 | ORAL_TABLET | ORAL | Status: DC
Start: 2017-11-11 — End: 2017-11-13

## 2017-11-13 MED ORDER — PANTOPRAZOLE SODIUM 40 MG PO TBEC
40.00 | DELAYED_RELEASE_TABLET | ORAL | Status: DC
Start: 2017-11-11 — End: 2017-11-13

## 2017-11-13 MED ORDER — WARFARIN SODIUM 1 MG PO TABS
ORAL_TABLET | ORAL | Status: DC
Start: ? — End: 2017-11-13

## 2017-11-13 MED ORDER — GABAPENTIN 300 MG PO CAPS
300.00 | ORAL_CAPSULE | ORAL | Status: DC
Start: 2017-11-11 — End: 2017-11-13

## 2017-11-13 MED ORDER — DILTIAZEM HCL ER COATED BEADS 180 MG PO CP24
180.00 | ORAL_CAPSULE | ORAL | Status: DC
Start: 2017-11-12 — End: 2017-11-13

## 2017-11-13 MED ORDER — POLYETHYLENE GLYCOL 3350 17 G PO PACK
17.00 | PACK | ORAL | Status: DC
Start: 2017-11-12 — End: 2017-11-13

## 2017-11-13 MED ORDER — METOPROLOL TARTRATE 50 MG PO TABS
50.00 | ORAL_TABLET | ORAL | Status: DC
Start: 2017-11-11 — End: 2017-11-13

## 2017-11-13 MED ORDER — SODIUM CHLORIDE 0.9 % IV SOLN
75.00 | INTRAVENOUS | Status: DC
Start: ? — End: 2017-11-13

## 2017-11-13 MED ORDER — BENZOCAINE-MENTHOL 15-3.6 MG MT LOZG
1.00 | LOZENGE | OROMUCOSAL | Status: DC
Start: ? — End: 2017-11-13

## 2017-11-13 MED ORDER — POTASSIUM CHLORIDE ER 10 MEQ PO TBCR
20.00 | EXTENDED_RELEASE_TABLET | ORAL | Status: DC
Start: 2017-11-11 — End: 2017-11-13

## 2017-11-13 MED ORDER — LABETALOL HCL 5 MG/ML IV SOLN
10.00 | INTRAVENOUS | Status: DC
Start: ? — End: 2017-11-13

## 2017-11-15 DIAGNOSIS — Z7901 Long term (current) use of anticoagulants: Secondary | ICD-10-CM | POA: Diagnosis not present

## 2017-11-15 DIAGNOSIS — D631 Anemia in chronic kidney disease: Secondary | ICD-10-CM | POA: Diagnosis not present

## 2017-11-15 DIAGNOSIS — Z5181 Encounter for therapeutic drug level monitoring: Secondary | ICD-10-CM | POA: Diagnosis not present

## 2017-11-15 DIAGNOSIS — Z7952 Long term (current) use of systemic steroids: Secondary | ICD-10-CM | POA: Diagnosis not present

## 2017-11-15 DIAGNOSIS — E1122 Type 2 diabetes mellitus with diabetic chronic kidney disease: Secondary | ICD-10-CM | POA: Diagnosis not present

## 2017-11-15 DIAGNOSIS — I129 Hypertensive chronic kidney disease with stage 1 through stage 4 chronic kidney disease, or unspecified chronic kidney disease: Secondary | ICD-10-CM | POA: Diagnosis not present

## 2017-11-15 DIAGNOSIS — T84032D Mechanical loosening of internal right knee prosthetic joint, subsequent encounter: Secondary | ICD-10-CM | POA: Diagnosis not present

## 2017-11-15 DIAGNOSIS — N183 Chronic kidney disease, stage 3 (moderate): Secondary | ICD-10-CM | POA: Diagnosis not present

## 2017-11-15 DIAGNOSIS — E1140 Type 2 diabetes mellitus with diabetic neuropathy, unspecified: Secondary | ICD-10-CM | POA: Diagnosis not present

## 2017-11-15 DIAGNOSIS — I480 Paroxysmal atrial fibrillation: Secondary | ICD-10-CM | POA: Diagnosis not present

## 2017-11-15 DIAGNOSIS — I2510 Atherosclerotic heart disease of native coronary artery without angina pectoris: Secondary | ICD-10-CM | POA: Diagnosis not present

## 2017-11-15 DIAGNOSIS — G4733 Obstructive sleep apnea (adult) (pediatric): Secondary | ICD-10-CM | POA: Diagnosis not present

## 2017-11-18 ENCOUNTER — Other Ambulatory Visit: Payer: Self-pay | Admitting: *Deleted

## 2017-11-18 DIAGNOSIS — D631 Anemia in chronic kidney disease: Secondary | ICD-10-CM | POA: Diagnosis not present

## 2017-11-18 DIAGNOSIS — N183 Chronic kidney disease, stage 3 (moderate): Secondary | ICD-10-CM | POA: Diagnosis not present

## 2017-11-18 DIAGNOSIS — I10 Essential (primary) hypertension: Secondary | ICD-10-CM | POA: Diagnosis not present

## 2017-11-18 DIAGNOSIS — Z96651 Presence of right artificial knee joint: Secondary | ICD-10-CM | POA: Diagnosis not present

## 2017-11-18 DIAGNOSIS — Z7689 Persons encountering health services in other specified circumstances: Secondary | ICD-10-CM | POA: Diagnosis not present

## 2017-11-18 DIAGNOSIS — D591 Other autoimmune hemolytic anemias: Secondary | ICD-10-CM | POA: Diagnosis not present

## 2017-11-18 NOTE — Patient Outreach (Addendum)
[facility]) Care Management  11/18/2017  Buddy DutyBarbara P Galli 22-Jul-1940 [id number]   Outreach Attempts: Multiple attempts to establish contact with patient without success. No response from letter mailed to patient. Case is being closed at this time.  Plan:Case closure  Gean MaidensFrances Hardeep Reetz BSN RN [facility] Care Management [phone]

## 2017-11-19 DIAGNOSIS — I129 Hypertensive chronic kidney disease with stage 1 through stage 4 chronic kidney disease, or unspecified chronic kidney disease: Secondary | ICD-10-CM | POA: Diagnosis not present

## 2017-11-19 DIAGNOSIS — Z7901 Long term (current) use of anticoagulants: Secondary | ICD-10-CM | POA: Diagnosis not present

## 2017-11-19 DIAGNOSIS — I2510 Atherosclerotic heart disease of native coronary artery without angina pectoris: Secondary | ICD-10-CM | POA: Diagnosis not present

## 2017-11-19 DIAGNOSIS — Z5181 Encounter for therapeutic drug level monitoring: Secondary | ICD-10-CM | POA: Diagnosis not present

## 2017-11-19 DIAGNOSIS — E1140 Type 2 diabetes mellitus with diabetic neuropathy, unspecified: Secondary | ICD-10-CM | POA: Diagnosis not present

## 2017-11-19 DIAGNOSIS — D631 Anemia in chronic kidney disease: Secondary | ICD-10-CM | POA: Diagnosis not present

## 2017-11-19 DIAGNOSIS — I480 Paroxysmal atrial fibrillation: Secondary | ICD-10-CM | POA: Diagnosis not present

## 2017-11-19 DIAGNOSIS — N183 Chronic kidney disease, stage 3 (moderate): Secondary | ICD-10-CM | POA: Diagnosis not present

## 2017-11-19 DIAGNOSIS — E1122 Type 2 diabetes mellitus with diabetic chronic kidney disease: Secondary | ICD-10-CM | POA: Diagnosis not present

## 2017-11-19 DIAGNOSIS — G4733 Obstructive sleep apnea (adult) (pediatric): Secondary | ICD-10-CM | POA: Diagnosis not present

## 2017-11-19 DIAGNOSIS — Z7952 Long term (current) use of systemic steroids: Secondary | ICD-10-CM | POA: Diagnosis not present

## 2017-11-19 DIAGNOSIS — T84032D Mechanical loosening of internal right knee prosthetic joint, subsequent encounter: Secondary | ICD-10-CM | POA: Diagnosis not present

## 2017-11-22 ENCOUNTER — Ambulatory Visit (INDEPENDENT_AMBULATORY_CARE_PROVIDER_SITE_OTHER): Payer: Medicare Other | Admitting: *Deleted

## 2017-11-22 DIAGNOSIS — N183 Chronic kidney disease, stage 3 (moderate): Secondary | ICD-10-CM | POA: Diagnosis not present

## 2017-11-22 DIAGNOSIS — Z7952 Long term (current) use of systemic steroids: Secondary | ICD-10-CM | POA: Diagnosis not present

## 2017-11-22 DIAGNOSIS — I495 Sick sinus syndrome: Secondary | ICD-10-CM | POA: Diagnosis not present

## 2017-11-22 DIAGNOSIS — I2510 Atherosclerotic heart disease of native coronary artery without angina pectoris: Secondary | ICD-10-CM | POA: Diagnosis not present

## 2017-11-22 DIAGNOSIS — E1122 Type 2 diabetes mellitus with diabetic chronic kidney disease: Secondary | ICD-10-CM | POA: Diagnosis not present

## 2017-11-22 DIAGNOSIS — E1140 Type 2 diabetes mellitus with diabetic neuropathy, unspecified: Secondary | ICD-10-CM | POA: Diagnosis not present

## 2017-11-22 DIAGNOSIS — G4733 Obstructive sleep apnea (adult) (pediatric): Secondary | ICD-10-CM | POA: Diagnosis not present

## 2017-11-22 DIAGNOSIS — D631 Anemia in chronic kidney disease: Secondary | ICD-10-CM | POA: Diagnosis not present

## 2017-11-22 DIAGNOSIS — I129 Hypertensive chronic kidney disease with stage 1 through stage 4 chronic kidney disease, or unspecified chronic kidney disease: Secondary | ICD-10-CM | POA: Diagnosis not present

## 2017-11-22 DIAGNOSIS — T84032D Mechanical loosening of internal right knee prosthetic joint, subsequent encounter: Secondary | ICD-10-CM | POA: Diagnosis not present

## 2017-11-22 DIAGNOSIS — I480 Paroxysmal atrial fibrillation: Secondary | ICD-10-CM | POA: Diagnosis not present

## 2017-11-22 DIAGNOSIS — Z5181 Encounter for therapeutic drug level monitoring: Secondary | ICD-10-CM | POA: Diagnosis not present

## 2017-11-22 DIAGNOSIS — Z7901 Long term (current) use of anticoagulants: Secondary | ICD-10-CM | POA: Diagnosis not present

## 2017-11-23 ENCOUNTER — Ambulatory Visit: Payer: Medicare Other | Admitting: Internal Medicine

## 2017-11-23 DIAGNOSIS — G4733 Obstructive sleep apnea (adult) (pediatric): Secondary | ICD-10-CM | POA: Diagnosis not present

## 2017-11-23 DIAGNOSIS — I129 Hypertensive chronic kidney disease with stage 1 through stage 4 chronic kidney disease, or unspecified chronic kidney disease: Secondary | ICD-10-CM | POA: Diagnosis not present

## 2017-11-23 DIAGNOSIS — I2510 Atherosclerotic heart disease of native coronary artery without angina pectoris: Secondary | ICD-10-CM | POA: Diagnosis not present

## 2017-11-23 DIAGNOSIS — I480 Paroxysmal atrial fibrillation: Secondary | ICD-10-CM | POA: Diagnosis not present

## 2017-11-23 DIAGNOSIS — D631 Anemia in chronic kidney disease: Secondary | ICD-10-CM | POA: Diagnosis not present

## 2017-11-23 DIAGNOSIS — E1122 Type 2 diabetes mellitus with diabetic chronic kidney disease: Secondary | ICD-10-CM | POA: Diagnosis not present

## 2017-11-23 DIAGNOSIS — Z7952 Long term (current) use of systemic steroids: Secondary | ICD-10-CM | POA: Diagnosis not present

## 2017-11-23 DIAGNOSIS — E1140 Type 2 diabetes mellitus with diabetic neuropathy, unspecified: Secondary | ICD-10-CM | POA: Diagnosis not present

## 2017-11-23 DIAGNOSIS — N183 Chronic kidney disease, stage 3 (moderate): Secondary | ICD-10-CM | POA: Diagnosis not present

## 2017-11-23 DIAGNOSIS — Z7901 Long term (current) use of anticoagulants: Secondary | ICD-10-CM | POA: Diagnosis not present

## 2017-11-23 DIAGNOSIS — Z5181 Encounter for therapeutic drug level monitoring: Secondary | ICD-10-CM | POA: Diagnosis not present

## 2017-11-23 DIAGNOSIS — T84032D Mechanical loosening of internal right knee prosthetic joint, subsequent encounter: Secondary | ICD-10-CM | POA: Diagnosis not present

## 2017-11-23 NOTE — Progress Notes (Signed)
Remote pacemaker transmission.   

## 2017-11-24 ENCOUNTER — Encounter: Payer: Self-pay | Admitting: Internal Medicine

## 2017-11-24 ENCOUNTER — Ambulatory Visit (INDEPENDENT_AMBULATORY_CARE_PROVIDER_SITE_OTHER): Payer: Medicare Other | Admitting: Internal Medicine

## 2017-11-24 DIAGNOSIS — D5911 Warm autoimmune hemolytic anemia: Secondary | ICD-10-CM

## 2017-11-24 DIAGNOSIS — D591 Other autoimmune hemolytic anemias: Secondary | ICD-10-CM

## 2017-11-24 MED ORDER — POTASSIUM CHLORIDE CRYS ER 20 MEQ PO TBCR
20.0000 meq | EXTENDED_RELEASE_TABLET | ORAL | 3 refills | Status: DC
Start: 2017-11-24 — End: 2018-09-16

## 2017-11-24 NOTE — Assessment & Plan Note (Addendum)
Seen hematology at [facility] and thought to be related to cephalosporin exposure. She is taking prednisone and tapering. Current dosing 60 mg daily. Plan to wean to 40 mg daily after 10 days of 60 mg daily.

## 2017-11-24 NOTE — Patient Instructions (Signed)
We do not need labs today but will get you in with the hematologist the blood specialist to monitor the prednisone and get you off of this.

## 2017-11-24 NOTE — Progress Notes (Signed)
   Subjective:    Patient ID: Robin Snow, female    DOB: [birth year], 77 y.o.   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 She has seen hematologist at [facility] and they are in the process of weaning her prednisone down. She has had great improvement in RBC count on the prednisone. She is still going through recovery for her knee and this is still hurting and mobility is poor. Denies chest pains or SOB. Denies abdominal pain or diarrhea or constipation. Denies blood in stool or bleeding. No concerns about wound infection today. Needs local hematologist.   [facility], [facility], social history reviewed and updated.   Review of Systems  Constitutional: Negative.   HENT: Negative.   Eyes: Negative.   Respiratory: Negative for cough, chest tightness and shortness of breath.   Cardiovascular: Negative for chest pain, palpitations and leg swelling.  Gastrointestinal: Negative for abdominal distention, abdominal pain, constipation, diarrhea, nausea and vomiting.  Musculoskeletal: Positive for arthralgias and gait problem.  Skin: Negative.   Psychiatric/Behavioral: Negative.       Objective:   Physical Exam  Constitutional: She is oriented to person, place, and time. She appears well-developed and well-nourished.  HENT:  Head: Normocephalic and atraumatic.  Eyes: EOM are normal.  Neck: Normal range of motion.  Cardiovascular: Normal rate and regular rhythm.  Pulmonary/Chest: Effort normal and breath sounds normal. No respiratory distress. She has no wheezes. She has no rales.  Abdominal: Soft. Bowel sounds are normal. She exhibits no distension. There is no tenderness. There is no rebound.  Musculoskeletal: She exhibits tenderness. She exhibits no edema.  Neurological: She is alert and  oriented to person, place, and time. Coordination abnormal.  In wheelchair  Skin: Skin is warm and dry.  Psychiatric: She has a normal mood and affect.   Vitals:   11/24/17 0909  BP: 120/70  Pulse: 60  Temp: 98.5 F (36.9 C)  TempSrc: Oral  SpO2: 97%  Height: 5\' 7"  (1.702 m)      Assessment & Plan:

## 2017-11-25 ENCOUNTER — Telehealth: Payer: Self-pay | Admitting: Internal Medicine

## 2017-11-25 ENCOUNTER — Encounter: Payer: Self-pay | Admitting: Internal Medicine

## 2017-11-25 DIAGNOSIS — I2510 Atherosclerotic heart disease of native coronary artery without angina pectoris: Secondary | ICD-10-CM | POA: Diagnosis not present

## 2017-11-25 DIAGNOSIS — D631 Anemia in chronic kidney disease: Secondary | ICD-10-CM | POA: Diagnosis not present

## 2017-11-25 DIAGNOSIS — T84032D Mechanical loosening of internal right knee prosthetic joint, subsequent encounter: Secondary | ICD-10-CM | POA: Diagnosis not present

## 2017-11-25 DIAGNOSIS — G4733 Obstructive sleep apnea (adult) (pediatric): Secondary | ICD-10-CM | POA: Diagnosis not present

## 2017-11-25 DIAGNOSIS — I480 Paroxysmal atrial fibrillation: Secondary | ICD-10-CM | POA: Diagnosis not present

## 2017-11-25 DIAGNOSIS — E1140 Type 2 diabetes mellitus with diabetic neuropathy, unspecified: Secondary | ICD-10-CM | POA: Diagnosis not present

## 2017-11-25 DIAGNOSIS — Z7901 Long term (current) use of anticoagulants: Secondary | ICD-10-CM | POA: Diagnosis not present

## 2017-11-25 DIAGNOSIS — N183 Chronic kidney disease, stage 3 (moderate): Secondary | ICD-10-CM | POA: Diagnosis not present

## 2017-11-25 DIAGNOSIS — I129 Hypertensive chronic kidney disease with stage 1 through stage 4 chronic kidney disease, or unspecified chronic kidney disease: Secondary | ICD-10-CM | POA: Diagnosis not present

## 2017-11-25 DIAGNOSIS — Z7952 Long term (current) use of systemic steroids: Secondary | ICD-10-CM | POA: Diagnosis not present

## 2017-11-25 DIAGNOSIS — Z5181 Encounter for therapeutic drug level monitoring: Secondary | ICD-10-CM | POA: Diagnosis not present

## 2017-11-25 DIAGNOSIS — E1122 Type 2 diabetes mellitus with diabetic chronic kidney disease: Secondary | ICD-10-CM | POA: Diagnosis not present

## 2017-11-25 NOTE — Telephone Encounter (Signed)
Okay to check at home, [facility] dosing and adjustment and intervals for INR check. Can they send recent records to us.

## 2017-11-25 NOTE — Telephone Encounter (Signed)
Pt has been scheduled to see Dr. Arbutus PedMohamed on 9/11 at 1130am. The pt's voicemail is full. I sent a message to the referring office to have them notify the pt. Letter mailed.

## 2017-11-25 NOTE — Telephone Encounter (Signed)
Informed kelly of MD response stated understanding

## 2017-11-25 NOTE — Telephone Encounter (Unsigned)
Copied from CRM [phone]. Topic: General - Other >> Nov 25, 2017 10:53 AM Percival SpanishKennedy, Cheryl W wrote:  Tresa EndoKelly with [facility] call to ask if Dr Okey Duprerawford would like them to keep check on pt INR  without her coming into the office  She said she does not have orders past Sunday. The orthopedic doctor Dr Valentina LucksGriffin is no longer going to manage pt INR  Can leave a message   [phone]

## 2017-11-29 DIAGNOSIS — Z471 Aftercare following joint replacement surgery: Secondary | ICD-10-CM | POA: Diagnosis not present

## 2017-11-30 ENCOUNTER — Ambulatory Visit (INDEPENDENT_AMBULATORY_CARE_PROVIDER_SITE_OTHER): Payer: Medicare Other | Admitting: General Practice

## 2017-11-30 DIAGNOSIS — G4733 Obstructive sleep apnea (adult) (pediatric): Secondary | ICD-10-CM | POA: Diagnosis not present

## 2017-11-30 DIAGNOSIS — I480 Paroxysmal atrial fibrillation: Secondary | ICD-10-CM | POA: Diagnosis not present

## 2017-11-30 DIAGNOSIS — E1122 Type 2 diabetes mellitus with diabetic chronic kidney disease: Secondary | ICD-10-CM | POA: Diagnosis not present

## 2017-11-30 DIAGNOSIS — T84032D Mechanical loosening of internal right knee prosthetic joint, subsequent encounter: Secondary | ICD-10-CM | POA: Diagnosis not present

## 2017-11-30 DIAGNOSIS — Z5181 Encounter for therapeutic drug level monitoring: Secondary | ICD-10-CM | POA: Diagnosis not present

## 2017-11-30 DIAGNOSIS — I2510 Atherosclerotic heart disease of native coronary artery without angina pectoris: Secondary | ICD-10-CM | POA: Diagnosis not present

## 2017-11-30 DIAGNOSIS — Z7952 Long term (current) use of systemic steroids: Secondary | ICD-10-CM | POA: Diagnosis not present

## 2017-11-30 DIAGNOSIS — I482 Chronic atrial fibrillation: Secondary | ICD-10-CM

## 2017-11-30 DIAGNOSIS — N183 Chronic kidney disease, stage 3 (moderate): Secondary | ICD-10-CM | POA: Diagnosis not present

## 2017-11-30 DIAGNOSIS — I4821 Permanent atrial fibrillation: Secondary | ICD-10-CM

## 2017-11-30 DIAGNOSIS — Z7901 Long term (current) use of anticoagulants: Secondary | ICD-10-CM

## 2017-11-30 DIAGNOSIS — I129 Hypertensive chronic kidney disease with stage 1 through stage 4 chronic kidney disease, or unspecified chronic kidney disease: Secondary | ICD-10-CM | POA: Diagnosis not present

## 2017-11-30 DIAGNOSIS — D631 Anemia in chronic kidney disease: Secondary | ICD-10-CM | POA: Diagnosis not present

## 2017-11-30 DIAGNOSIS — E1140 Type 2 diabetes mellitus with diabetic neuropathy, unspecified: Secondary | ICD-10-CM | POA: Diagnosis not present

## 2017-11-30 LAB — POCT INR: INR: 1.8 — AB (ref 2.0–3.0)

## 2017-11-30 NOTE — Patient Instructions (Addendum)
Pre visit review using our clinic review tool, if applicable. No additional management support is needed unless otherwise documented below in the visit note.  Take 5 mg daily and re-check on Friday.   Dosing instructions [name].  Re-check INR on Friday. [phone].

## 2017-11-30 NOTE — Telephone Encounter (Signed)
Noted.  New dosing instructions given to [name].

## 2017-11-30 NOTE — Progress Notes (Signed)
Medical treatment/procedure(s) were performed by non-physician practitioner and as supervising physician I was immediately available for consultation/collaboration. I agree with above. Kedarius Aloisi A Jisela Merlino, MD  

## 2017-11-30 NOTE — Telephone Encounter (Signed)
Robin Snow calling to inform on INR.  INR =1.8 . Pt has been taking 5mg  of coumadin since 8/22 no dark leafy greens and no falls since the last time. Okay to leave a detailed message [phone]

## 2017-12-03 ENCOUNTER — Telehealth: Payer: Self-pay

## 2017-12-03 ENCOUNTER — Ambulatory Visit (INDEPENDENT_AMBULATORY_CARE_PROVIDER_SITE_OTHER): Payer: Medicare Other | Admitting: General Practice

## 2017-12-03 DIAGNOSIS — I129 Hypertensive chronic kidney disease with stage 1 through stage 4 chronic kidney disease, or unspecified chronic kidney disease: Secondary | ICD-10-CM | POA: Diagnosis not present

## 2017-12-03 DIAGNOSIS — N183 Chronic kidney disease, stage 3 (moderate): Secondary | ICD-10-CM | POA: Diagnosis not present

## 2017-12-03 DIAGNOSIS — Z5181 Encounter for therapeutic drug level monitoring: Secondary | ICD-10-CM | POA: Diagnosis not present

## 2017-12-03 DIAGNOSIS — E1122 Type 2 diabetes mellitus with diabetic chronic kidney disease: Secondary | ICD-10-CM | POA: Diagnosis not present

## 2017-12-03 DIAGNOSIS — G4733 Obstructive sleep apnea (adult) (pediatric): Secondary | ICD-10-CM | POA: Diagnosis not present

## 2017-12-03 DIAGNOSIS — I482 Chronic atrial fibrillation: Secondary | ICD-10-CM

## 2017-12-03 DIAGNOSIS — Z7901 Long term (current) use of anticoagulants: Secondary | ICD-10-CM

## 2017-12-03 DIAGNOSIS — D631 Anemia in chronic kidney disease: Secondary | ICD-10-CM | POA: Diagnosis not present

## 2017-12-03 DIAGNOSIS — I480 Paroxysmal atrial fibrillation: Secondary | ICD-10-CM | POA: Diagnosis not present

## 2017-12-03 DIAGNOSIS — T84032D Mechanical loosening of internal right knee prosthetic joint, subsequent encounter: Secondary | ICD-10-CM | POA: Diagnosis not present

## 2017-12-03 DIAGNOSIS — I2510 Atherosclerotic heart disease of native coronary artery without angina pectoris: Secondary | ICD-10-CM | POA: Diagnosis not present

## 2017-12-03 DIAGNOSIS — Z7952 Long term (current) use of systemic steroids: Secondary | ICD-10-CM | POA: Diagnosis not present

## 2017-12-03 DIAGNOSIS — I4821 Permanent atrial fibrillation: Secondary | ICD-10-CM

## 2017-12-03 DIAGNOSIS — E1140 Type 2 diabetes mellitus with diabetic neuropathy, unspecified: Secondary | ICD-10-CM | POA: Diagnosis not present

## 2017-12-03 LAB — POCT INR: INR: 1.4 — AB (ref 2.0–3.0)

## 2017-12-03 NOTE — Progress Notes (Signed)
Medical treatment/procedure(s) were performed by non-physician practitioner and as supervising physician I was immediately available for consultation/collaboration. I agree with above. Shashank Kwasnik A Corie Vavra, MD  

## 2017-12-03 NOTE — Telephone Encounter (Signed)
Copied from CRM [id number]. Topic: Inquiry >> Dec 03, 2017  2:28 PM [name], Robin Snow: Reason for CRM: [facility] is calling to give a PT inr reading and to be contacted back in order to give the proper dosage of medication: protime 16.9  INR 1.4  current dose: coumadin 5mg  daily  Contact: [phone]

## 2017-12-03 NOTE — Telephone Encounter (Signed)
Noted.  Dosing instructions have been given to [facility]Kelly, [name] [facility] and to patient's daughter, Netty Starringadene.  Both verbalized understanding.

## 2017-12-03 NOTE — Patient Instructions (Addendum)
Pre visit review using our clinic review tool, if applicable. No additional management support is needed unless otherwise documented below in the visit note.  Take 1 1/2 tablets today and tomorrow and then take 1 tablet daily and re-check in 1 week.  Dosing instructions given to [facility] RN [facility] [phone] and to daughter, Netty Starringadene. Re-check INR on Friday. [phone]. See note under patient findings.

## 2017-12-07 ENCOUNTER — Telehealth: Payer: Self-pay | Admitting: Internal Medicine

## 2017-12-07 DIAGNOSIS — T84032D Mechanical loosening of internal right knee prosthetic joint, subsequent encounter: Secondary | ICD-10-CM | POA: Diagnosis not present

## 2017-12-07 DIAGNOSIS — E1122 Type 2 diabetes mellitus with diabetic chronic kidney disease: Secondary | ICD-10-CM | POA: Diagnosis not present

## 2017-12-07 DIAGNOSIS — G4733 Obstructive sleep apnea (adult) (pediatric): Secondary | ICD-10-CM | POA: Diagnosis not present

## 2017-12-07 DIAGNOSIS — Z7901 Long term (current) use of anticoagulants: Secondary | ICD-10-CM | POA: Diagnosis not present

## 2017-12-07 DIAGNOSIS — Z5181 Encounter for therapeutic drug level monitoring: Secondary | ICD-10-CM | POA: Diagnosis not present

## 2017-12-07 DIAGNOSIS — I2510 Atherosclerotic heart disease of native coronary artery without angina pectoris: Secondary | ICD-10-CM | POA: Diagnosis not present

## 2017-12-07 DIAGNOSIS — N183 Chronic kidney disease, stage 3 (moderate): Secondary | ICD-10-CM | POA: Diagnosis not present

## 2017-12-07 DIAGNOSIS — I480 Paroxysmal atrial fibrillation: Secondary | ICD-10-CM | POA: Diagnosis not present

## 2017-12-07 DIAGNOSIS — D631 Anemia in chronic kidney disease: Secondary | ICD-10-CM | POA: Diagnosis not present

## 2017-12-07 DIAGNOSIS — E1140 Type 2 diabetes mellitus with diabetic neuropathy, unspecified: Secondary | ICD-10-CM | POA: Diagnosis not present

## 2017-12-07 DIAGNOSIS — Z7952 Long term (current) use of systemic steroids: Secondary | ICD-10-CM | POA: Diagnosis not present

## 2017-12-07 DIAGNOSIS — I129 Hypertensive chronic kidney disease with stage 1 through stage 4 chronic kidney disease, or unspecified chronic kidney disease: Secondary | ICD-10-CM | POA: Diagnosis not present

## 2017-12-07 NOTE — Telephone Encounter (Signed)
Confirmed appt on 9/11 at 1130am for the pt to see Dr. Arbutus Ped with the pt's daughter.

## 2017-12-10 ENCOUNTER — Other Ambulatory Visit: Payer: Self-pay | Admitting: Physician Assistant

## 2017-12-10 ENCOUNTER — Telehealth: Payer: Self-pay | Admitting: Internal Medicine

## 2017-12-10 ENCOUNTER — Ambulatory Visit (INDEPENDENT_AMBULATORY_CARE_PROVIDER_SITE_OTHER): Payer: Medicare Other | Admitting: General Practice

## 2017-12-10 DIAGNOSIS — E1140 Type 2 diabetes mellitus with diabetic neuropathy, unspecified: Secondary | ICD-10-CM | POA: Diagnosis not present

## 2017-12-10 DIAGNOSIS — I480 Paroxysmal atrial fibrillation: Secondary | ICD-10-CM | POA: Diagnosis not present

## 2017-12-10 DIAGNOSIS — D631 Anemia in chronic kidney disease: Secondary | ICD-10-CM | POA: Diagnosis not present

## 2017-12-10 DIAGNOSIS — I129 Hypertensive chronic kidney disease with stage 1 through stage 4 chronic kidney disease, or unspecified chronic kidney disease: Secondary | ICD-10-CM | POA: Diagnosis not present

## 2017-12-10 DIAGNOSIS — I482 Chronic atrial fibrillation: Secondary | ICD-10-CM

## 2017-12-10 DIAGNOSIS — E1122 Type 2 diabetes mellitus with diabetic chronic kidney disease: Secondary | ICD-10-CM | POA: Diagnosis not present

## 2017-12-10 DIAGNOSIS — I4821 Permanent atrial fibrillation: Secondary | ICD-10-CM

## 2017-12-10 DIAGNOSIS — I2510 Atherosclerotic heart disease of native coronary artery without angina pectoris: Secondary | ICD-10-CM | POA: Diagnosis not present

## 2017-12-10 DIAGNOSIS — Z7901 Long term (current) use of anticoagulants: Secondary | ICD-10-CM | POA: Diagnosis not present

## 2017-12-10 DIAGNOSIS — G4733 Obstructive sleep apnea (adult) (pediatric): Secondary | ICD-10-CM | POA: Diagnosis not present

## 2017-12-10 DIAGNOSIS — T84032D Mechanical loosening of internal right knee prosthetic joint, subsequent encounter: Secondary | ICD-10-CM | POA: Diagnosis not present

## 2017-12-10 DIAGNOSIS — Z7952 Long term (current) use of systemic steroids: Secondary | ICD-10-CM | POA: Diagnosis not present

## 2017-12-10 DIAGNOSIS — Z5181 Encounter for therapeutic drug level monitoring: Secondary | ICD-10-CM | POA: Diagnosis not present

## 2017-12-10 DIAGNOSIS — N183 Chronic kidney disease, stage 3 (moderate): Secondary | ICD-10-CM | POA: Diagnosis not present

## 2017-12-10 LAB — POCT INR: INR: 6.3 — AB (ref 2.0–3.0)

## 2017-12-10 MED ORDER — TORSEMIDE 20 MG PO TABS: 20.0000 mg | ORAL_TABLET | Freq: Two times a day (BID) | ORAL | 5 refills | Status: DC

## 2017-12-10 NOTE — Telephone Encounter (Signed)
Patient called and advised Lasix was discontinued by the provider and she's supposed to be taking Torsemide instead. She asked for a refill, if she can have it. I advised I will submit the refill. She says her feet are swollen and that she will call on Monday if they do not go down over the weekend.

## 2017-12-10 NOTE — Telephone Encounter (Signed)
Copied from CRM [id number]. Topic: Quick Communication - Rx Refill/Question >> Dec 10, 2017  3:28 PM Tamela Oddi, NT wrote: Medication: furosemide (LASIX) 40 MG tablet   Has the patient contacted their pharmacy? No. (Agent: If no, request that the patient contact the pharmacy for the refill.) (Agent: If yes, when and what did the pharmacy advise?)  Preferred Pharmacy (with phone number or street name): [facility] - [place], [place] - [address] [phone] (Phone) [phone] (Fax)    Agent: Please be advised that RX refills may take up to 3 business days. We ask that you follow-up with your pharmacy.

## 2017-12-10 NOTE — Patient Instructions (Signed)
Pre visit review using our clinic review tool, if applicable. No additional management support is needed unless otherwise documented below in the visit note.  Hold coumadin through Tuesday.  Re-check on Wednesday.  Dosing instructions given to [facility], [name] @ [facility].  Re-check INR on Friday. [phone].

## 2017-12-13 NOTE — Progress Notes (Signed)
Medical treatment/procedure(s) were performed by non-physician practitioner and as supervising physician I was immediately available for consultation/collaboration. I agree with above. Malik Paar A Arvetta Araque, MD  

## 2017-12-15 ENCOUNTER — Inpatient Hospital Stay: Payer: Medicare Other

## 2017-12-15 ENCOUNTER — Telehealth: Payer: Self-pay | Admitting: Internal Medicine

## 2017-12-15 ENCOUNTER — Ambulatory Visit (INDEPENDENT_AMBULATORY_CARE_PROVIDER_SITE_OTHER): Payer: Medicare Other | Admitting: General Practice

## 2017-12-15 ENCOUNTER — Encounter: Payer: Self-pay | Admitting: Internal Medicine

## 2017-12-15 ENCOUNTER — Inpatient Hospital Stay: Payer: Medicare Other | Attending: Internal Medicine | Admitting: Internal Medicine

## 2017-12-15 VITALS — BP 143/92 | HR 121 | Temp 98.1°F | Resp 18

## 2017-12-15 DIAGNOSIS — D5919 Other autoimmune hemolytic anemia: Secondary | ICD-10-CM

## 2017-12-15 DIAGNOSIS — I482 Chronic atrial fibrillation: Secondary | ICD-10-CM

## 2017-12-15 DIAGNOSIS — E119 Type 2 diabetes mellitus without complications: Secondary | ICD-10-CM | POA: Diagnosis not present

## 2017-12-15 DIAGNOSIS — I2510 Atherosclerotic heart disease of native coronary artery without angina pectoris: Secondary | ICD-10-CM | POA: Diagnosis not present

## 2017-12-15 DIAGNOSIS — I4821 Permanent atrial fibrillation: Secondary | ICD-10-CM

## 2017-12-15 DIAGNOSIS — I10 Essential (primary) hypertension: Secondary | ICD-10-CM | POA: Diagnosis not present

## 2017-12-15 DIAGNOSIS — T84032D Mechanical loosening of internal right knee prosthetic joint, subsequent encounter: Secondary | ICD-10-CM | POA: Diagnosis not present

## 2017-12-15 DIAGNOSIS — Z87891 Personal history of nicotine dependence: Secondary | ICD-10-CM | POA: Diagnosis not present

## 2017-12-15 DIAGNOSIS — D631 Anemia in chronic kidney disease: Secondary | ICD-10-CM | POA: Diagnosis not present

## 2017-12-15 DIAGNOSIS — D591 Other autoimmune hemolytic anemias: Secondary | ICD-10-CM | POA: Insufficient documentation

## 2017-12-15 DIAGNOSIS — Z7901 Long term (current) use of anticoagulants: Secondary | ICD-10-CM | POA: Diagnosis not present

## 2017-12-15 DIAGNOSIS — E1140 Type 2 diabetes mellitus with diabetic neuropathy, unspecified: Secondary | ICD-10-CM | POA: Diagnosis not present

## 2017-12-15 DIAGNOSIS — E1122 Type 2 diabetes mellitus with diabetic chronic kidney disease: Secondary | ICD-10-CM | POA: Diagnosis not present

## 2017-12-15 DIAGNOSIS — Z7952 Long term (current) use of systemic steroids: Secondary | ICD-10-CM | POA: Diagnosis not present

## 2017-12-15 DIAGNOSIS — Z5181 Encounter for therapeutic drug level monitoring: Secondary | ICD-10-CM | POA: Diagnosis not present

## 2017-12-15 DIAGNOSIS — I129 Hypertensive chronic kidney disease with stage 1 through stage 4 chronic kidney disease, or unspecified chronic kidney disease: Secondary | ICD-10-CM | POA: Diagnosis not present

## 2017-12-15 DIAGNOSIS — D594 Other nonautoimmune hemolytic anemias: Secondary | ICD-10-CM

## 2017-12-15 DIAGNOSIS — I480 Paroxysmal atrial fibrillation: Secondary | ICD-10-CM | POA: Diagnosis not present

## 2017-12-15 DIAGNOSIS — N183 Chronic kidney disease, stage 3 (moderate): Secondary | ICD-10-CM | POA: Diagnosis not present

## 2017-12-15 DIAGNOSIS — G4733 Obstructive sleep apnea (adult) (pediatric): Secondary | ICD-10-CM | POA: Diagnosis not present

## 2017-12-15 LAB — CBC WITH DIFFERENTIAL (CANCER CENTER ONLY)
BASOS ABS: 0 10*3/uL (ref 0.0–0.1)
Basophils Relative: 0 %
Eosinophils Absolute: 0 10*3/uL (ref 0.0–0.5)
Eosinophils Relative: 0 %
HEMATOCRIT: 38.2 % (ref 34.8–46.6)
Hemoglobin: 12.4 g/dL (ref 11.6–15.9)
LYMPHS ABS: 1.7 10*3/uL (ref 0.9–3.3)
LYMPHS PCT: 22 %
MCH: 29.5 pg (ref 25.1–34.0)
MCHC: 32.5 g/dL (ref 31.5–36.0)
MCV: 91 fL (ref 79.5–101.0)
MONO ABS: 0.4 10*3/uL (ref 0.1–0.9)
Monocytes Relative: 5 %
NEUTROS ABS: 5.7 10*3/uL (ref 1.5–6.5)
Neutrophils Relative %: 73 %
Platelet Count: 136 10*3/uL — ABNORMAL LOW (ref 145–400)
RBC: 4.2 MIL/uL (ref 3.70–5.45)
RDW: 17.9 % — ABNORMAL HIGH (ref 11.2–14.5)
WBC Count: 7.9 10*3/uL (ref 3.9–10.3)

## 2017-12-15 LAB — CMP (CANCER CENTER ONLY)
ALBUMIN: 3.5 g/dL (ref 3.5–5.0)
ALT: 32 U/L (ref 0–44)
ANION GAP: 9 (ref 5–15)
AST: 18 U/L (ref 15–41)
Alkaline Phosphatase: 66 U/L (ref 38–126)
BILIRUBIN TOTAL: 1 mg/dL (ref 0.3–1.2)
BUN: 33 mg/dL — ABNORMAL HIGH (ref 8–23)
CO2: 23 mmol/L (ref 22–32)
CREATININE: 1.5 mg/dL — AB (ref 0.44–1.00)
Calcium: 9.4 mg/dL (ref 8.9–10.3)
Chloride: 109 mmol/L (ref 98–111)
GFR, EST NON AFRICAN AMERICAN: 32 mL/min — AB (ref >60)
GFR, Est AFR Am: 37 mL/min — ABNORMAL LOW (ref >60)
Glucose, Bld: 140 mg/dL — ABNORMAL HIGH (ref 70–99)
Potassium: 5.2 mmol/L — ABNORMAL HIGH (ref 3.5–5.1)
Sodium: 141 mmol/L (ref 135–145)
TOTAL PROTEIN: 6.8 g/dL (ref 6.5–8.1)

## 2017-12-15 LAB — DIRECT ANTIGLOBULIN TEST (NOT AT ARMC)
DAT, IgG: NEGATIVE
DAT, complement: NEGATIVE

## 2017-12-15 LAB — LACTATE DEHYDROGENASE: LDH: 395 U/L — ABNORMAL HIGH (ref 98–192)

## 2017-12-15 LAB — RETICULOCYTES
RBC.: 4.2 MIL/uL (ref 3.70–5.45)
Retic Count, Absolute: 75.6 10*3/uL (ref 33.7–90.7)
Retic Ct Pct: 1.8 % (ref 0.7–2.1)

## 2017-12-15 LAB — POCT INR: INR: 2.7 (ref 2.0–3.0)

## 2017-12-15 NOTE — Progress Notes (Signed)
I have reviewed and agree with this plan  

## 2017-12-15 NOTE — Progress Notes (Signed)
[facility] [phone]   [phone]  CONSULT NOTE  REFERRING PHYSICIAN: Dr. Lanny Cramp  REASON FOR CONSULTATION:  77 years old African-American female with hemolytic anemia.  HPI Robin Snow is a 77 y.o. female with past medical history significant for hypertension, diabetes mellitus, chronic kidney disease, dyslipidemia, pulmonary hypertension, stroke, coronary artery disease, osteoarthritis as well as history of anemia.  The patient was admitted to [facility] for right knee replacement.  During her evaluation she was found to have significant anemia.  Her hemoglobin was down to 7.7 and hematocrit 24%.  Platelet count were also low at 144,000.  Iron study was performed at that time and showed iron level of 177, total iron-binding capacity was 219, iron saturation 81%, ferritin level was 264.  Haptoglobin level on 11/18/2017 was less than 10.  LDH was elevated at 292.  Her bilirubin was also elevated at 1.6.  Direct Coombs test was performed on November 05, 2017 and it was positive for IgG and negative for anti-C3.  The patient was a started on IV steroids during her hospitalization and she was discharged home on prednisone 80 mg p.o. daily to be tapered slowly over the following few weeks.  She is currently on 40 mg p.o. daily.  She is tolerating her prednisone fairly well with no concerning adverse effects.  She denied having any bleeding issues.  She has no nausea, vomiting, diarrhea or constipation.  She denied having any fever or chills.  She has no weight loss or night sweats.  She denied having any headache or visual changes. Family history significant for father with osteoarthritis, mother died when she was 68 years old.  The patient has 2 daughters with multiple sclerosis and one with myasthenia gravis. She is a widow and has 6 children, 2 deceased.  She used to work as a Lawyer.  She was accompanied by her daughter [name] today.  She has  no history for smoking, alcohol or drug abuse.  HPI  Past Medical History:  Diagnosis Date  . AKI (acute kidney injury) (HCC) 04/2016  . Anemia   . Arthritis   . Chronic atrial fibrillation (HCC)   . Chronic edema   . Chronic venous insufficiency   . CKD (chronic kidney disease), stage III (HCC)   . Coagulopathy (HCC)   . Diabetes mellitus    Type 2  . Diverticulosis   . Dyslipidemia   . GERD (gastroesophageal reflux disease)   . GI bleed 2015   a. lower GI from diverticular source 2015.  [name] Hx of transfusion of packed red blood cells   . Hypertension   . Morbid obesity (HCC)   . Obstructive sleep apnea    on CPAP  . Pulmonary hypertension (HCC)   . Pyelonephritis 04/2016  . Renal infarct (HCC)    a. 04/2016- adm with flank pain, ? pyelo vs renal infarct in setting of recent subtherapeutic INR.  [name] Sinus bradycardia   . Stroke [facility]) 2015    Past Surgical History:  Procedure Laterality Date  . CESAREAN SECTION    . COLONOSCOPY Left 01/24/2013   Procedure: COLONOSCOPY;  Surgeon: Willis Modena, MD;  Location: [facility] ENDOSCOPY;  Service: Endoscopy;  Laterality: Left;  . ESOPHAGOGASTRODUODENOSCOPY Left 01/23/2013   Procedure: ESOPHAGOGASTRODUODENOSCOPY (EGD);  Surgeon: Willis Modena, MD;  Location: [facility] ENDOSCOPY;  Service: Endoscopy;  Laterality: Left;  . EYE SURGERY Bilateral    cataract surgery  . JOINT REPLACEMENT    .  PACEMAKER IMPLANT [name] 09/09/2016   Procedure: Pacemaker Implant;  Surgeon: Marinus Maw, MD;  Location: [facility] INVASIVE CV LAB;  Service: Cardiovascular;  Laterality: [name];  . REPLACEMENT TOTAL KNEE BILATERAL    . TUBAL LIGATION      Family History  Problem Relation Age of Onset  . Cancer Father   . Stroke Maternal Aunt     Social History Social History   Tobacco Use  . Smoking status: Former Smoker    Packs/day: 0.12    Years: 34.00    Pack years: 4.08    Types: Cigarettes    Last attempt to quit: 07/07/1993    Years since quitting: 24.4  .  Smokeless tobacco: Never Used  Substance Use Topics  . Alcohol use: No    Comment: no longer drinks alcohol  . Drug use: No    Allergies  Allergen Reactions  . Aspirin Hives  . Rofecoxib Hives  . Hydrocodone Hives    Tolerates oxycodone and tramadol    Current Outpatient Medications  Medication Sig Dispense Refill  . CARTIA XT 180 MG 24 hr capsule TAKE 1 CAPSULE BY MOUTH ONCE DAILY 90 capsule 0  . enoxaparin (LOVENOX) 120 MG/0.8ML injection Inject 0.8 mLs (120 mg total) into the skin every 12 (twelve) hours. 5 Syringe 0  . ferrous sulfate 325 (65 FE) MG EC tablet TAKE ONE TABLET BY MOUTH TWICE DAILY 60 tablet 6  . gabapentin (NEURONTIN) 300 MG capsule TAKE 1 CAPSULE THREE TIMES DAILY 270 capsule 0  . lovastatin (MEVACOR) 20 MG tablet Take 1 tablet (20 mg total) by mouth at bedtime. 30 tablet 0  . meclizine (ANTIVERT) 25 MG tablet Take 1 tablet (25 mg total) by mouth every 4 (four) hours as needed for dizziness. 60 tablet 0  . metoprolol tartrate (LOPRESSOR) 50 MG tablet Take 1 tablet (50 mg total) by mouth 2 (two) times daily. 180 tablet 3  . potassium chloride SA (K-DUR,KLOR-CON) 20 MEQ tablet Take 1 tablet (20 mEq total) by mouth as directed. Take 2 tabs (40 meq) in the AM and 1 tab (20 meq) in the PM 135 tablet 3  . predniSONE (DELTASONE) 20 MG tablet TAKE 3 TABLETS BY MOUTH ONCE DAILY FOR 10 DAYS THEN 2 ONCE DAILY  1  . torsemide (DEMADEX) 20 MG tablet Take 1 tablet (20 mg total) by mouth 2 (two) times daily. 60 tablet 5  . warfarin (COUMADIN) 5 MG tablet Take 1 tablet Mon/Wed/Fridays and 1/2 tablet all other days OR AS DIRECTED - 90 day 90 tablet 1   No current facility-administered medications for this visit.     Review of Systems  Constitutional: positive for fatigue Eyes: negative Ears, nose, mouth, throat, and face: negative Respiratory: negative Cardiovascular: negative Gastrointestinal: negative Genitourinary:negative Integument/breast:  negative Hematologic/lymphatic: negative Musculoskeletal:negative Neurological: negative Behavioral/Psych: negative Endocrine: negative Allergic/Immunologic: negative  Physical Exam  KGM:WNUUV, healthy, no distress, well nourished, well developed and obese SKIN: skin color, texture, turgor are normal, no rashes or significant lesions HEAD: Normocephalic, No masses, lesions, tenderness or abnormalities EYES: normal, PERRLA, Conjunctiva are pink and non-injected EARS: External ears normal OROPHARYNX:no exudate, no erythema and lips, buccal mucosa, and tongue normal  NECK: supple, no adenopathy, no JVD LYMPH:  no palpable lymphadenopathy, no hepatosplenomegaly BREAST:not examined LUNGS: clear to auscultation , and palpation HEART: regular rate & rhythm, no murmurs and no gallops ABDOMEN:abdomen soft, non-tender, obese, normal bowel sounds and no masses or organomegaly BACK: Back symmetric, no curvature., No CVA tenderness EXTREMITIES:no  joint deformities, effusion, or inflammation, 2+ edema bilateral NEURO: alert & oriented x 3 with fluent speech, no focal motor/sensory deficits  PERFORMANCE STATUS: ECOG 1  LABORATORY DATA: Lab Results  Component Value Date   WBC 6.2 02/22/2017   HGB 10.6 (L) 02/22/2017   HCT 32.0 (L) 02/22/2017   MCV 87.2 02/22/2017   PLT 139 (L) 02/22/2017      Chemistry      Component Value Date/Time   NA 140 02/22/2017 0340   NA 139 08/19/2016 1010   K 4.4 02/22/2017 0340   CL 107 02/22/2017 0340   CO2 26 02/22/2017 0340   BUN 16 02/22/2017 0340   BUN 25 08/19/2016 1010   CREATININE 1.65 (H) 02/22/2017 0340      Component Value Date/Time   CALCIUM 8.5 (L) 02/22/2017 0340   ALKPHOS 90 02/22/2017 0340   AST 20 02/22/2017 0340   ALT 10 (L) 02/22/2017 0340   BILITOT 0.9 02/22/2017 0340       RADIOGRAPHIC STUDIES: No results found.  ASSESSMENT: This is a very pleasant 77 years old African-American female recently diagnosed with autoimmune  hemolytic anemia.  The patient is currently on treatment with prednisone 60 mg p.o. daily.  PLAN: I had a lengthy discussion with the patient and her daughter today about her current condition and treatment options. I ordered repeat CBC, complaints metabolic panel, reticulocyte count, haptoglobin, direct antiglobulin test as well as LDH and PNH panel today. CBC today showed significant improvement in her hemoglobin and hematocrit as well as a reticulocyte count. I recommended for the patient to continue her current treatment with prednisone 40 mg p.o. daily for the remaining portion of this week.  Starting from next week she will will be on prednisone 20 mg p.o. Daily. I will see the patient back for follow-up visit in around 2 weeks for evaluation with repeat CBC, comprehensive metabolic panel and LDH. The patient was advised to call immediately if she has any concerning symptoms in the interval. The patient voices understanding of current disease status and treatment options and is in agreement with the current care plan.  All questions were answered. The patient knows to call the clinic with any problems, questions or concerns. We can certainly see the patient much sooner if necessary.  Thank you so much for allowing me to participate in the care of [name]. I will continue to follow up the patient with you and assist in her care.  I spent 40 minutes counseling the patient face to face. The total time spent in the appointment was 60 minutes.  Disclaimer: This note was dictated with voice recognition software. Similar sounding words can inadvertently be transcribed and may not be corrected upon review.   Lajuana Matte December 15, 2017, 11:54 AM

## 2017-12-15 NOTE — Telephone Encounter (Signed)
Gave pt avs and calendar  °

## 2017-12-15 NOTE — Patient Instructions (Signed)
Pre visit review using our clinic review tool, if applicable. No additional management support is needed unless otherwise documented below in the visit note. 

## 2017-12-16 LAB — HAPTOGLOBIN: Haptoglobin: 150 mg/dL (ref 34–200)

## 2017-12-17 ENCOUNTER — Ambulatory Visit: Payer: Self-pay | Admitting: *Deleted

## 2017-12-17 DIAGNOSIS — N183 Chronic kidney disease, stage 3 (moderate): Secondary | ICD-10-CM | POA: Diagnosis not present

## 2017-12-17 DIAGNOSIS — G4733 Obstructive sleep apnea (adult) (pediatric): Secondary | ICD-10-CM | POA: Diagnosis not present

## 2017-12-17 DIAGNOSIS — Z5181 Encounter for therapeutic drug level monitoring: Secondary | ICD-10-CM | POA: Diagnosis not present

## 2017-12-17 DIAGNOSIS — D631 Anemia in chronic kidney disease: Secondary | ICD-10-CM | POA: Diagnosis not present

## 2017-12-17 DIAGNOSIS — T84032D Mechanical loosening of internal right knee prosthetic joint, subsequent encounter: Secondary | ICD-10-CM | POA: Diagnosis not present

## 2017-12-17 DIAGNOSIS — Z7952 Long term (current) use of systemic steroids: Secondary | ICD-10-CM | POA: Diagnosis not present

## 2017-12-17 DIAGNOSIS — I129 Hypertensive chronic kidney disease with stage 1 through stage 4 chronic kidney disease, or unspecified chronic kidney disease: Secondary | ICD-10-CM | POA: Diagnosis not present

## 2017-12-17 DIAGNOSIS — I480 Paroxysmal atrial fibrillation: Secondary | ICD-10-CM | POA: Diagnosis not present

## 2017-12-17 DIAGNOSIS — Z7901 Long term (current) use of anticoagulants: Secondary | ICD-10-CM | POA: Diagnosis not present

## 2017-12-17 DIAGNOSIS — E1140 Type 2 diabetes mellitus with diabetic neuropathy, unspecified: Secondary | ICD-10-CM | POA: Diagnosis not present

## 2017-12-17 DIAGNOSIS — I2510 Atherosclerotic heart disease of native coronary artery without angina pectoris: Secondary | ICD-10-CM | POA: Diagnosis not present

## 2017-12-17 DIAGNOSIS — E1122 Type 2 diabetes mellitus with diabetic chronic kidney disease: Secondary | ICD-10-CM | POA: Diagnosis not present

## 2017-12-17 NOTE — Telephone Encounter (Signed)
I don't see anything on labs to explain this severity of swelling; with 3+ pedal edema, IV diuretic is probably warranted if she has been taking Torsemide bid.

## 2017-12-17 NOTE — Telephone Encounter (Signed)
Robin Snow, [facility] calling to report that the pt is having +3 edema in bilateral lower extremities. Pt states that she noticed the swelling initially during the visit on Wed but notes that legs have not improved. Pt reports that swelling in legs does decrease with elevating legs at night. Pt has been taking Torsemide 40 mg twice a day and has been urinating without difficulty. Nurse reports that lungs are clear and BP is 138/80. Pt has no complaints of chest pain or other symptoms at this time. Robin Snow states that pt was seen by Dr. Arbutus PedMohamed and labs drawn and wanted to know if those labs could be reviewed by Dr. Okey Duprerawford.  Robin Snow also asking for orders for nursing to be decreased to two times next week and then after that nursing once a week for 3 weeks.  Answer Assessment - Initial Assessment Questions 1. ONSET: "When did the swelling start?" (e.g., minutes, hours, days)     Home health nurse noticed on Wed during visit and states that legs have not gotten better while visiting the patient today.  2. LOCATION: "What part of the leg is swollen?"  "Are both legs swollen or just one leg?"     Bilateral lower extremities and stops at knee 3. SEVERITY: "How bad is the swelling?" (e.g., localized; mild, moderate, severe)  - Localized - small area of swelling localized to one leg  - MILD pedal edema - swelling limited to foot and ankle, pitting edema < 1/4 inch (6 mm) deep, rest and elevation eliminate most or all swelling  - MODERATE edema - swelling of lower leg to knee, pitting edema > 1/4 inch (6 mm) deep, rest and elevation only partially reduce swelling  - SEVERE edema - swelling extends above knee, facial or hand swelling present      moderate 4. REDNESS: "Does the swelling look red or infected?"     No 5. PAIN: "Is the swelling painful to touch?" If so, ask: "How painful is it?"   (Scale 1-10; mild, moderate or severe)     No pain 6. FEVER: "Do you have a fever?" If so, ask: "What is it,  how was it measured, and when did it start?"      no 7. CAUSE: "What do you think is causing the leg swelling?"     unknown 8. MEDICAL HISTORY: "Do you have a history of heart failure, kidney disease, liver failure, or cancer?"     Congestive heart failure 9. RECURRENT SYMPTOM: "Have you had leg swelling before?" If so, ask: "When was the last time?" "What happened that time?"     Yes  10. OTHER SYMPTOMS: "Do you have any other symptoms?" (e.g., chest pain, difficulty breathing)      A little shortness of breath but not bad 11. PREGNANCY: "Is there any chance you are pregnant?" "When was your last menstrual period?"      n/a  Protocols used: LEG SWELLING AND EDEMA-A-AH

## 2017-12-17 NOTE — Telephone Encounter (Signed)
Please advise per Dr. Crawford's Absence. Thank you  

## 2017-12-20 NOTE — Telephone Encounter (Signed)
Robin Snow also asking for orders for nursing to be decreased to two times next week and then after that nursing once a week for 3 weeks.

## 2017-12-20 NOTE — Telephone Encounter (Signed)
Informed [name] of providers responses and to give verbal orders. Stated understanding, kelly will see patient this week and will let patient know of Md response

## 2017-12-20 NOTE — Telephone Encounter (Signed)
Would recommend office visit for swelling if not improving. Okay with decrease in nursing frequency.

## 2017-12-21 LAB — PNH PROFILE (-HIGH SENSITIVITY)

## 2017-12-22 ENCOUNTER — Ambulatory Visit (INDEPENDENT_AMBULATORY_CARE_PROVIDER_SITE_OTHER): Payer: Medicare Other | Admitting: General Practice

## 2017-12-22 DIAGNOSIS — G4733 Obstructive sleep apnea (adult) (pediatric): Secondary | ICD-10-CM | POA: Diagnosis not present

## 2017-12-22 DIAGNOSIS — I129 Hypertensive chronic kidney disease with stage 1 through stage 4 chronic kidney disease, or unspecified chronic kidney disease: Secondary | ICD-10-CM | POA: Diagnosis not present

## 2017-12-22 DIAGNOSIS — E1140 Type 2 diabetes mellitus with diabetic neuropathy, unspecified: Secondary | ICD-10-CM | POA: Diagnosis not present

## 2017-12-22 DIAGNOSIS — I4821 Permanent atrial fibrillation: Secondary | ICD-10-CM

## 2017-12-22 DIAGNOSIS — I2510 Atherosclerotic heart disease of native coronary artery without angina pectoris: Secondary | ICD-10-CM | POA: Diagnosis not present

## 2017-12-22 DIAGNOSIS — D631 Anemia in chronic kidney disease: Secondary | ICD-10-CM | POA: Diagnosis not present

## 2017-12-22 DIAGNOSIS — I480 Paroxysmal atrial fibrillation: Secondary | ICD-10-CM | POA: Diagnosis not present

## 2017-12-22 DIAGNOSIS — Z5181 Encounter for therapeutic drug level monitoring: Secondary | ICD-10-CM | POA: Diagnosis not present

## 2017-12-22 DIAGNOSIS — Z7901 Long term (current) use of anticoagulants: Secondary | ICD-10-CM | POA: Diagnosis not present

## 2017-12-22 DIAGNOSIS — T84032D Mechanical loosening of internal right knee prosthetic joint, subsequent encounter: Secondary | ICD-10-CM | POA: Diagnosis not present

## 2017-12-22 DIAGNOSIS — N183 Chronic kidney disease, stage 3 (moderate): Secondary | ICD-10-CM | POA: Diagnosis not present

## 2017-12-22 DIAGNOSIS — E1122 Type 2 diabetes mellitus with diabetic chronic kidney disease: Secondary | ICD-10-CM | POA: Diagnosis not present

## 2017-12-22 DIAGNOSIS — I482 Chronic atrial fibrillation: Secondary | ICD-10-CM | POA: Diagnosis not present

## 2017-12-22 DIAGNOSIS — Z7952 Long term (current) use of systemic steroids: Secondary | ICD-10-CM | POA: Diagnosis not present

## 2017-12-22 LAB — POCT INR: INR: 1.2 — AB (ref 2.0–3.0)

## 2017-12-22 NOTE — Progress Notes (Signed)
I have reviewed documentation by the health coach and agree with documentation. I was immediately available for questions.  

## 2017-12-22 NOTE — Patient Instructions (Addendum)
Pre visit review using our clinic review tool, if applicable. No additional management support is needed unless otherwise documented below in the visit note.  Take 7.5 mg today and 5 mg tomorrow and then continue to take 2.5 mg daily except 5 mg on Mon/Wed/ Fri.  Re-check in 1 week.   [facility]osing [name].  Re-check INR on Friday. [phone].

## 2017-12-23 DIAGNOSIS — I480 Paroxysmal atrial fibrillation: Secondary | ICD-10-CM | POA: Diagnosis not present

## 2017-12-23 DIAGNOSIS — I2510 Atherosclerotic heart disease of native coronary artery without angina pectoris: Secondary | ICD-10-CM | POA: Diagnosis not present

## 2017-12-23 DIAGNOSIS — T84032D Mechanical loosening of internal right knee prosthetic joint, subsequent encounter: Secondary | ICD-10-CM | POA: Diagnosis not present

## 2017-12-23 DIAGNOSIS — Z7952 Long term (current) use of systemic steroids: Secondary | ICD-10-CM | POA: Diagnosis not present

## 2017-12-23 DIAGNOSIS — E1140 Type 2 diabetes mellitus with diabetic neuropathy, unspecified: Secondary | ICD-10-CM | POA: Diagnosis not present

## 2017-12-23 DIAGNOSIS — I129 Hypertensive chronic kidney disease with stage 1 through stage 4 chronic kidney disease, or unspecified chronic kidney disease: Secondary | ICD-10-CM | POA: Diagnosis not present

## 2017-12-23 DIAGNOSIS — E1122 Type 2 diabetes mellitus with diabetic chronic kidney disease: Secondary | ICD-10-CM | POA: Diagnosis not present

## 2017-12-23 DIAGNOSIS — G4733 Obstructive sleep apnea (adult) (pediatric): Secondary | ICD-10-CM | POA: Diagnosis not present

## 2017-12-23 DIAGNOSIS — Z7901 Long term (current) use of anticoagulants: Secondary | ICD-10-CM | POA: Diagnosis not present

## 2017-12-23 DIAGNOSIS — D631 Anemia in chronic kidney disease: Secondary | ICD-10-CM | POA: Diagnosis not present

## 2017-12-23 DIAGNOSIS — Z5181 Encounter for therapeutic drug level monitoring: Secondary | ICD-10-CM | POA: Diagnosis not present

## 2017-12-23 DIAGNOSIS — N183 Chronic kidney disease, stage 3 (moderate): Secondary | ICD-10-CM | POA: Diagnosis not present

## 2017-12-27 LAB — CUP PACEART REMOTE DEVICE CHECK
Battery Remaining Longevity: 110 mo
Battery Remaining Percentage: 95.5 %
Battery Voltage: 3.01 V
Brady Statistic AP VP Percent: 20 %
Brady Statistic AP VS Percent: 1 %
Brady Statistic AS VP Percent: 26 %
Brady Statistic AS VS Percent: 53 %
Brady Statistic RA Percent Paced: 20 %
Brady Statistic RV Percent Paced: 45 %
Date Time Interrogation Session: 20190820171130
Implantable Lead Implant Date: 20180606
Implantable Lead Implant Date: 20180606
Implantable Lead Location: 753859
Implantable Lead Location: 753860
Implantable Lead Model: 3830
Implantable Pulse Generator Implant Date: 20180606
Lead Channel Impedance Value: 390 Ohm
Lead Channel Impedance Value: 450 Ohm
Lead Channel Pacing Threshold Amplitude: 0.5 V
Lead Channel Pacing Threshold Amplitude: 0.75 V
Lead Channel Pacing Threshold Pulse Width: 0.5 ms
Lead Channel Pacing Threshold Pulse Width: 0.5 ms
Lead Channel Sensing Intrinsic Amplitude: 1.5 mV
Lead Channel Sensing Intrinsic Amplitude: 12 mV
Lead Channel Setting Pacing Amplitude: 2 V
Lead Channel Setting Pacing Amplitude: 2.5 V
Lead Channel Setting Pacing Pulse Width: 0.5 ms
Lead Channel Setting Sensing Sensitivity: 2 mV
Pulse Gen Model: 2272
Pulse Gen Serial Number: 8912837

## 2017-12-28 ENCOUNTER — Ambulatory Visit: Payer: Medicare Other | Admitting: Family

## 2017-12-29 ENCOUNTER — Ambulatory Visit: Payer: Self-pay

## 2017-12-29 DIAGNOSIS — D631 Anemia in chronic kidney disease: Secondary | ICD-10-CM | POA: Diagnosis not present

## 2017-12-29 DIAGNOSIS — T84032D Mechanical loosening of internal right knee prosthetic joint, subsequent encounter: Secondary | ICD-10-CM | POA: Diagnosis not present

## 2017-12-29 DIAGNOSIS — N183 Chronic kidney disease, stage 3 (moderate): Secondary | ICD-10-CM | POA: Diagnosis not present

## 2017-12-29 DIAGNOSIS — E1122 Type 2 diabetes mellitus with diabetic chronic kidney disease: Secondary | ICD-10-CM | POA: Diagnosis not present

## 2017-12-29 DIAGNOSIS — Z5181 Encounter for therapeutic drug level monitoring: Secondary | ICD-10-CM | POA: Diagnosis not present

## 2017-12-29 DIAGNOSIS — I2510 Atherosclerotic heart disease of native coronary artery without angina pectoris: Secondary | ICD-10-CM | POA: Diagnosis not present

## 2017-12-29 DIAGNOSIS — I480 Paroxysmal atrial fibrillation: Secondary | ICD-10-CM | POA: Diagnosis not present

## 2017-12-29 DIAGNOSIS — Z7901 Long term (current) use of anticoagulants: Secondary | ICD-10-CM | POA: Diagnosis not present

## 2017-12-29 DIAGNOSIS — I129 Hypertensive chronic kidney disease with stage 1 through stage 4 chronic kidney disease, or unspecified chronic kidney disease: Secondary | ICD-10-CM | POA: Diagnosis not present

## 2017-12-29 DIAGNOSIS — E1140 Type 2 diabetes mellitus with diabetic neuropathy, unspecified: Secondary | ICD-10-CM | POA: Diagnosis not present

## 2017-12-29 DIAGNOSIS — G4733 Obstructive sleep apnea (adult) (pediatric): Secondary | ICD-10-CM | POA: Diagnosis not present

## 2017-12-29 DIAGNOSIS — Z7952 Long term (current) use of systemic steroids: Secondary | ICD-10-CM | POA: Diagnosis not present

## 2017-12-29 NOTE — Telephone Encounter (Signed)
Called patient and Kellid lvm for patient to call back here or to call [facility] for [facility] full message from Dr. Lawerance BachBurns

## 2017-12-29 NOTE — Telephone Encounter (Signed)
Is there any other recommendations for patient other than what was verbalized to patient in note below? Patient does have an appointment with Dr. Okey Dupre on the 3rd  Please advise per Dr. Frutoso Chase absence. Thank you

## 2017-12-29 NOTE — Telephone Encounter (Signed)
She has permanent Afib and a HR above 100 is not unusual for her.  She is on warfarin.    I do not know her and I do not know if her SOB is new or old - if SOB does not go away or worsens she should be evaluated sooner than 10/3.  She had similar edema on 9/11 when she saw oncology.

## 2017-12-29 NOTE — Telephone Encounter (Signed)
Lavona Mound, [name] with [facility] called and says "I have to call and report when the patient's heart rate is above 100. Today the therapist came in and the patient was lying in bed, she had a HR 110. The patient did not receive therapy and the therapist waited until I got here. Her HR is 118 at rest, BP lying 112/76 and after 15 minutes I sat her up and her BP was 110/64. She denies CP, but reports SOB on exertion. O2 sats range from 93-98%, but did drop 89% and came back up. The therapist reported low-mid 80's, but I didn't see those O2 readings. Her lungs are clear and diminished to the RLL, she has BLE 2+ non-pitting edema." According to protocol, home care advice given, Caryl Asp verbalized understanding. I advised I will send this over to Dr. Okey Dupre and she has any other recommendations, someone will call with those. Caryl Asp says she can be called and if she doesn't answer, a VM can be left and the voice mailbox is secure and confidential. The daughter says the patient has an appointment on 01/06/18 with Dr. Okey Dupre. I advised if that appointment needs to be changed to an earlier date, the office will let her know.    Reason for Disposition . Palpitations  Answer Assessment - Initial Assessment Questions 1.. PATTERN "Does it come and go, or has it been constant since it started?"  "Does it get worse with exertion?"   "Are you feeling it now?"     Irregular, tachycardia, same with exertion 2.. HEART RATE: "Can you tell me your heart rate?" "How many beats in 15 seconds?"  (Note: not all patients can do this)       113 3. RECURRENT SYMPTOM: "Have you ever had this before?" If so, ask: "When was the last time?" and "What happened that time?"      Chronic A-Fib 4. OTHER SYMPTOMS: "Do you have any other symptoms?" (e.g., dizziness, chest pain, sweating, difficulty breathing)      SOB on exertion 5.. PREGNANCY: "Is there any chance you are pregnant?" "When was your last menstrual period?"  No  Protocols used: HEART RATE AND HEARTBEAT QUESTIONS-A-AH

## 2017-12-30 ENCOUNTER — Ambulatory Visit (INDEPENDENT_AMBULATORY_CARE_PROVIDER_SITE_OTHER): Payer: Medicare Other | Admitting: General Practice

## 2017-12-30 DIAGNOSIS — I482 Chronic atrial fibrillation: Secondary | ICD-10-CM

## 2017-12-30 DIAGNOSIS — Z7901 Long term (current) use of anticoagulants: Secondary | ICD-10-CM

## 2017-12-30 DIAGNOSIS — I4821 Permanent atrial fibrillation: Secondary | ICD-10-CM

## 2017-12-30 LAB — POCT INR: INR: 3.8 — AB (ref 2.0–3.0)

## 2017-12-30 NOTE — Patient Instructions (Signed)
Pre visit review using our clinic review tool, if applicable. No additional management support is needed unless otherwise documented below in the visit note.  Hold dose today and take 2.5 mg tomorrow and then on Saturday continue to take 2.5 mg daily except 5 mg on Mon/Wed/ Fri.  Re-check in 1 week.   Dosing instructions given to [facility], [name] @ [facility].  Re-check INR on Friday. [phone].

## 2017-12-31 ENCOUNTER — Ambulatory Visit: Payer: Self-pay | Admitting: Internal Medicine

## 2017-12-31 DIAGNOSIS — G4733 Obstructive sleep apnea (adult) (pediatric): Secondary | ICD-10-CM | POA: Diagnosis not present

## 2017-12-31 DIAGNOSIS — Z7901 Long term (current) use of anticoagulants: Secondary | ICD-10-CM | POA: Diagnosis not present

## 2017-12-31 DIAGNOSIS — I2510 Atherosclerotic heart disease of native coronary artery without angina pectoris: Secondary | ICD-10-CM | POA: Diagnosis not present

## 2017-12-31 DIAGNOSIS — E1140 Type 2 diabetes mellitus with diabetic neuropathy, unspecified: Secondary | ICD-10-CM | POA: Diagnosis not present

## 2017-12-31 DIAGNOSIS — E1122 Type 2 diabetes mellitus with diabetic chronic kidney disease: Secondary | ICD-10-CM | POA: Diagnosis not present

## 2017-12-31 DIAGNOSIS — Z5181 Encounter for therapeutic drug level monitoring: Secondary | ICD-10-CM | POA: Diagnosis not present

## 2017-12-31 DIAGNOSIS — D631 Anemia in chronic kidney disease: Secondary | ICD-10-CM | POA: Diagnosis not present

## 2017-12-31 DIAGNOSIS — N183 Chronic kidney disease, stage 3 (moderate): Secondary | ICD-10-CM | POA: Diagnosis not present

## 2017-12-31 DIAGNOSIS — T84032D Mechanical loosening of internal right knee prosthetic joint, subsequent encounter: Secondary | ICD-10-CM | POA: Diagnosis not present

## 2017-12-31 DIAGNOSIS — I480 Paroxysmal atrial fibrillation: Secondary | ICD-10-CM | POA: Diagnosis not present

## 2017-12-31 DIAGNOSIS — I129 Hypertensive chronic kidney disease with stage 1 through stage 4 chronic kidney disease, or unspecified chronic kidney disease: Secondary | ICD-10-CM | POA: Diagnosis not present

## 2017-12-31 DIAGNOSIS — Z7952 Long term (current) use of systemic steroids: Secondary | ICD-10-CM | POA: Diagnosis not present

## 2017-12-31 NOTE — Telephone Encounter (Signed)
Robin Snow, physical therapist with [facility] called in letting Dr. Okey Dupre know the pt's pulse is 143 and regular.   Pt is totally asymptomatic.   She is sitting on the couch and denies shortness of breath, chest pain, or dizziness. Pt has history of A. Fib and has a pacemaker.   [name] is not going to do PT with pt today.    If orders are needed [name] said to call the daughter or the pt's daughter.  I have routed a high priority note to Dr. Okey Dupre making her aware of the elevated heart rate.     [facility] had called earlier this week with pt having an elevated heart rate per [name].   Reason for Disposition . [1] Heart beating very rapidly (e.g., > 140 / minute) AND [2] not present now  (Exception: during exercise)    HR 143 and regular.   Has hx of A. Fib and has a pacemaker.  Asymptomatic per physical therapist of [facility]  Answer Assessment - Initial Assessment Questions 1. DESCRIPTION: "Please describe your heart rate or heart beat that you are having" (e.g., fast/slow, regular/irregular, skipped or extra beats, "palpitations")   Robin Snow, PT with [facility] called in.    Pt's heart rate is 143.   No symptoms.   No therapy to be done. BP 100/64.  She has A. Fib and a pacemaker.   Regular rate at 143 per [name]. 2. ONSET: "When did it start?" (Minutes, hours or days)      That was the rate when [name] checked VS prior to starting PT. 3. DURATION: "How long does it last" (e.g., seconds, minutes, hours)     Still occurring. 4. PATTERN "Does it come and go, or has it been constant since it started?"  "Does it get worse with exertion?"   "Are you feeling it now?"     She is sitting on the couch right now without any symptoms; shortness of breath, chest pain. Dizziness, etc. 5. TAP: "Using your hand, can you tap out what you are feeling on a chair or table in front of you, so that I can hear?" (Note: not all patients can do this)       *No Answer* 6. HEART RATE: "Can you  tell me your heart rate?" "How many beats in 15 seconds?"  (Note: not all patients can do this)       *No Answer* 7. RECURRENT SYMPTOM: "Have you ever had this before?" If so, ask: "When was the last time?" and "What happened that time?"      Last Week [name] said they called Dr. Okey Dupre for a heart rate of 118.   No new orders were received for it. 8. CAUSE: "What do you think is causing the palpitations?"     Don't know.   She does have A. Fib and a pacemaker. 9. CARDIAC HISTORY: "Do you have any history of heart disease?" (e.g., heart attack, angina, bypass surgery, angioplasty, arrhythmia)      See above 10. OTHER SYMPTOMS: "Do you have any other symptoms?" (e.g., dizziness, chest pain, sweating, difficulty breathing)       No 11. PREGNANCY: "Is there any chance you are pregnant?" "When was your last menstrual period?"       Not asked due to age  Protocols used: HEART RATE AND HEARTBEAT QUESTIONS-A-AH

## 2018-01-04 NOTE — Telephone Encounter (Signed)
FYI

## 2018-01-05 DIAGNOSIS — N183 Chronic kidney disease, stage 3 (moderate): Secondary | ICD-10-CM | POA: Diagnosis not present

## 2018-01-05 DIAGNOSIS — T84032D Mechanical loosening of internal right knee prosthetic joint, subsequent encounter: Secondary | ICD-10-CM | POA: Diagnosis not present

## 2018-01-05 DIAGNOSIS — D631 Anemia in chronic kidney disease: Secondary | ICD-10-CM | POA: Diagnosis not present

## 2018-01-05 DIAGNOSIS — I480 Paroxysmal atrial fibrillation: Secondary | ICD-10-CM | POA: Diagnosis not present

## 2018-01-05 DIAGNOSIS — Z7901 Long term (current) use of anticoagulants: Secondary | ICD-10-CM | POA: Diagnosis not present

## 2018-01-05 DIAGNOSIS — I2510 Atherosclerotic heart disease of native coronary artery without angina pectoris: Secondary | ICD-10-CM | POA: Diagnosis not present

## 2018-01-05 DIAGNOSIS — Z5181 Encounter for therapeutic drug level monitoring: Secondary | ICD-10-CM | POA: Diagnosis not present

## 2018-01-05 DIAGNOSIS — E1122 Type 2 diabetes mellitus with diabetic chronic kidney disease: Secondary | ICD-10-CM | POA: Diagnosis not present

## 2018-01-05 DIAGNOSIS — I129 Hypertensive chronic kidney disease with stage 1 through stage 4 chronic kidney disease, or unspecified chronic kidney disease: Secondary | ICD-10-CM | POA: Diagnosis not present

## 2018-01-05 DIAGNOSIS — E1140 Type 2 diabetes mellitus with diabetic neuropathy, unspecified: Secondary | ICD-10-CM | POA: Diagnosis not present

## 2018-01-05 DIAGNOSIS — Z7952 Long term (current) use of systemic steroids: Secondary | ICD-10-CM | POA: Diagnosis not present

## 2018-01-05 DIAGNOSIS — G4733 Obstructive sleep apnea (adult) (pediatric): Secondary | ICD-10-CM | POA: Diagnosis not present

## 2018-01-05 NOTE — Progress Notes (Signed)
[facility] OFFICE PROGRESS NOTE  [name], MD [address] [place] [address]  DIAGNOSIS: Autoimmune hemolytic anemia.  PRIOR THERAPY: None  CURRENT THERAPY: Prednisone taper.  Current dose is 40 mg daily.  INTERVAL HISTORY: Robin Snow 77 y.o. female returns for routine follow-up visit.  The patient reports that she is feeling fine today and has no specific complaints except for intermittent dizziness which is unchanged from her baseline.  She denies fevers and chills.  Denies chest pain, shortness of breath, cough, hemoptysis.  Denies nausea, vomiting, constipation, diarrhea.  She has ongoing lower extremity edema which improved somewhat with leg elevation.  She is currently taking prednisone 40 mg daily despite the fact that she was instructed to decrease her dose to 20 mg daily at her last visit.  The patient is here for evaluation and repeat lab work.  MEDICAL HISTORY: Past Medical History:  Diagnosis Date  . AKI (acute kidney injury) (HCC) 04/2016  . Anemia   . Arthritis   . Chronic atrial fibrillation   . Chronic edema   . Chronic venous insufficiency   . CKD (chronic kidney disease), stage III (HCC)   . Coagulopathy (HCC)   . Diabetes mellitus    Type 2  . Diverticulosis   . Dyslipidemia   . GERD (gastroesophageal reflux disease)   . GI bleed 2015   a. lower GI from diverticular source 2015.  [name] Hx of transfusion of packed red blood cells   . Hypertension   . Morbid obesity (HCC)   . Obstructive sleep apnea    on CPAP  . Pulmonary hypertension (HCC)   . Pyelonephritis 04/2016  . Renal infarct (HCC)    a. 04/2016- adm with flank pain, ? pyelo vs renal infarct in setting of recent subtherapeutic INR.  [name] Sinus bradycardia   . Stroke [facility]) 2015    ALLERGIES:  is allergic to aspirin; rofecoxib; and hydrocodone.  MEDICATIONS:  Current Outpatient Medications  Medication Sig Dispense Refill  . acetaminophen (TYLENOL) 500 MG  tablet Take by mouth.    . CARTIA XT 180 MG 24 hr capsule TAKE 1 CAPSULE BY MOUTH ONCE DAILY 90 capsule 0  . ferrous sulfate 325 (65 FE) MG EC tablet TAKE ONE TABLET BY MOUTH TWICE DAILY 60 tablet 6  . furosemide (LASIX) 40 MG tablet     . gabapentin (NEURONTIN) 300 MG capsule TAKE 1 CAPSULE THREE TIMES DAILY 270 capsule 0  . glimepiride (AMARYL) 2 MG tablet     . hydrochlorothiazide (HYDRODIURIL) 12.5 MG tablet     . HYDROmorphone (DILAUDID) 2 MG tablet     . lisinopril (PRINIVIL,ZESTRIL) 20 MG tablet     . lovastatin (MEVACOR) 20 MG tablet Take 1 tablet (20 mg total) by mouth at bedtime. 30 tablet 0  . meclizine (ANTIVERT) 25 MG tablet Take 1 tablet (25 mg total) by mouth every 4 (four) hours as needed for dizziness. 60 tablet 0  . potassium chloride SA (K-DUR,KLOR-CON) 20 MEQ tablet Take 1 tablet (20 mEq total) by mouth as directed. Take 2 tabs (40 meq) in the AM and 1 tab (20 meq) in the PM 135 tablet 3  . predniSONE (DELTASONE) 20 MG tablet Take 1 tablet (20 mg total) by mouth daily with breakfast. 30 tablet 0  . Respiratory Therapy Supplies MISC by Does not apply route.    . senna-docusate (SENOKOT-S) 8.6-50 MG tablet Take by mouth.    . torsemide (DEMADEX) 20 MG  tablet Take 1 tablet (20 mg total) by mouth 2 (two) times daily. 60 tablet 5  . warfarin (COUMADIN) 5 MG tablet Take 1 tablet Mon/Wed/Fridays and 1/2 tablet all other days OR AS DIRECTED - 90 day 90 tablet 1  . metoprolol tartrate (LOPRESSOR) 50 MG tablet Take 1 tablet (50 mg total) by mouth 2 (two) times daily. 180 tablet 3   No current facility-administered medications for this visit.     SURGICAL HISTORY:  Past Surgical History:  Procedure Laterality Date  . CESAREAN SECTION    . COLONOSCOPY Left 01/24/2013   Procedure: COLONOSCOPY;  Surgeon: Willis Modena, MD;  Location: [facility] ENDOSCOPY;  Service: Endoscopy;  Laterality: Left;  . ESOPHAGOGASTRODUODENOSCOPY Left 01/23/2013   Procedure: ESOPHAGOGASTRODUODENOSCOPY (EGD);   Surgeon: Willis Modena, MD;  Location: [facility] ENDOSCOPY;  Service: Endoscopy;  Laterality: Left;  . EYE SURGERY Bilateral    cataract surgery  . JOINT REPLACEMENT    . PACEMAKER IMPLANT [name] 09/09/2016   Procedure: Pacemaker Implant;  Surgeon: Marinus Maw, MD;  Location: [facility] INVASIVE CV LAB;  Service: Cardiovascular;  Laterality: [name];  . REPLACEMENT TOTAL KNEE BILATERAL    . TUBAL LIGATION      REVIEW OF SYSTEMS:   Review of Systems  Constitutional: Negative for appetite change, chills, fever and unexpected weight change.  Positive for fatigue. HENT:   Negative for mouth sores, nosebleeds, sore throat and trouble swallowing.   Eyes: Negative for eye problems and icterus.  Respiratory: Negative for cough, hemoptysis, shortness of breath and wheezing.   Cardiovascular: Negative for chest pain.  Positive for lower extremity edema. Gastrointestinal: Negative for abdominal pain, constipation, diarrhea, nausea and vomiting.  Genitourinary: Negative for bladder incontinence, difficulty urinating, dysuria, frequency and hematuria.   Musculoskeletal: Negative for back pain, neck pain and neck stiffness.  Skin: Negative for itching and rash.  Neurological: Negative for dizziness, extremity weakness, headaches, light-headedness and seizures.  Positive for intermittent dizziness. Hematological: Negative for adenopathy. Does not bruise/bleed easily.  Psychiatric/Behavioral: Negative for confusion, depression and sleep disturbance. The patient is not nervous/anxious.     PHYSICAL EXAMINATION:  Blood pressure 101/68, pulse 100, temperature 97.9 F (36.6 C), temperature source Oral, resp. rate 14, height 5\' 7"  (1.702 m), weight 237 lb 8 oz (107.7 kg), SpO2 98 %.  ECOG PERFORMANCE STATUS: 1 - Symptomatic but completely ambulatory  Physical Exam  Constitutional: Oriented to person, place, and time and well-developed, well-nourished, and in no distress. No distress.  HENT:  Head: Normocephalic and  atraumatic.  Mouth/Throat: Oropharynx is clear and moist. No oropharyngeal exudate.  Eyes: Conjunctivae are normal. Right eye exhibits no discharge. Left eye exhibits no discharge. No scleral icterus.  Neck: Normal range of motion. Neck supple.  Cardiovascular: Normal rate, regular rhythm, normal heart sounds.  2+ edema to the bilateral lower extremities. Pulmonary/Chest: Effort normal and breath sounds normal. No respiratory distress. No wheezes. No rales.  Abdominal: Soft. Bowel sounds are normal. Exhibits no distension and no mass. There is no tenderness.  Musculoskeletal: Normal range of motion.   Lymphadenopathy:    No cervical adenopathy.  Neurological: Alert and oriented to person, place, and time. Exhibits normal muscle tone. Coordination normal.  Skin: Skin is warm and dry. No rash noted. Not diaphoretic. No erythema. No pallor.  Psychiatric: Mood, memory and judgment normal.  Vitals reviewed.  LABORATORY DATA: Lab Results  Component Value Date   WBC 10.1 01/06/2018   HGB 11.4 (L) 01/06/2018   HCT 34.1 (L) 01/06/2018  MCV 89.7 01/06/2018   PLT 205 01/06/2018      Chemistry      Component Value Date/Time   NA 139 01/06/2018 0925   NA 139 08/19/2016 1010   K 4.6 01/06/2018 0925   CL 103 01/06/2018 0925   CO2 23 01/06/2018 0925   BUN 29 (H) 01/06/2018 0925   BUN 25 08/19/2016 1010   CREATININE 1.82 (H) 01/06/2018 0925      Component Value Date/Time   CALCIUM 8.6 (L) 01/06/2018 0925   ALKPHOS 92 01/06/2018 0925   AST 14 (L) 01/06/2018 0925   ALT 10 01/06/2018 0925   BILITOT 0.9 01/06/2018 0925       RADIOGRAPHIC STUDIES:  No results found.   ASSESSMENT/PLAN:  Warm antibody hemolytic anemia (HCC) This is a very pleasant 77 year old African-American female recently diagnosed with autoimmune hemolytic anemia.  The patient is currently on treatment with prednisone 40 mg p.o. daily. CBC from today was reviewed with Dr. Arbutus Ped.  Hemoglobin is overall stable.   Recommend that she reduce the dose of her prednisone to 20 mg daily.  We will see her back in 2 weeks for evaluation and repeat lab work.  We will give her further instructions for tapering her prednisone dose at her next visit.  The patient was advised to call immediately if she has any concerning symptoms in the interval. The patient voices understanding of current disease status and treatment options and is in agreement with the current care plan.  All questions were answered. The patient knows to call the clinic with any problems, questions or concerns. We can certainly see the patient much sooner if necessary.   Orders Placed This Encounter  Procedures  . CBC with Differential (Cancer Center Only)    Standing Status:   Future    Standing Expiration Date:   01/07/2019  . CMP (Cancer Center only)    Standing Status:   Future    Standing Expiration Date:   01/07/2019  . Lactate dehydrogenase    Standing Status:   Future    Standing Expiration Date:   01/07/2019  . Reticulocytes    Standing Status:   Future    Standing Expiration Date:   01/07/2019     Clenton Pare, DNP, AGPCNP-BC, AOCNP 01/06/18

## 2018-01-06 ENCOUNTER — Inpatient Hospital Stay (HOSPITAL_BASED_OUTPATIENT_CLINIC_OR_DEPARTMENT_OTHER): Payer: Medicare Other | Admitting: Oncology

## 2018-01-06 ENCOUNTER — Encounter: Payer: Self-pay | Admitting: Oncology

## 2018-01-06 ENCOUNTER — Telehealth: Payer: Self-pay | Admitting: Oncology

## 2018-01-06 ENCOUNTER — Inpatient Hospital Stay: Payer: Medicare Other | Attending: Oncology

## 2018-01-06 ENCOUNTER — Other Ambulatory Visit: Payer: Self-pay | Admitting: *Deleted

## 2018-01-06 ENCOUNTER — Ambulatory Visit: Payer: Medicare Other | Admitting: Internal Medicine

## 2018-01-06 ENCOUNTER — Ambulatory Visit (INDEPENDENT_AMBULATORY_CARE_PROVIDER_SITE_OTHER): Payer: Medicare Other | Admitting: General Practice

## 2018-01-06 VITALS — BP 101/68 | HR 100 | Temp 97.9°F | Resp 14 | Ht 67.0 in | Wt 237.5 lb

## 2018-01-06 DIAGNOSIS — Z8673 Personal history of transient ischemic attack (TIA), and cerebral infarction without residual deficits: Secondary | ICD-10-CM | POA: Diagnosis not present

## 2018-01-06 DIAGNOSIS — E119 Type 2 diabetes mellitus without complications: Secondary | ICD-10-CM | POA: Diagnosis not present

## 2018-01-06 DIAGNOSIS — Z7952 Long term (current) use of systemic steroids: Secondary | ICD-10-CM

## 2018-01-06 DIAGNOSIS — Z79899 Other long term (current) drug therapy: Secondary | ICD-10-CM | POA: Diagnosis not present

## 2018-01-06 DIAGNOSIS — D591 Other autoimmune hemolytic anemias: Secondary | ICD-10-CM | POA: Insufficient documentation

## 2018-01-06 DIAGNOSIS — Z885 Allergy status to narcotic agent status: Secondary | ICD-10-CM | POA: Diagnosis not present

## 2018-01-06 DIAGNOSIS — Z0289 Encounter for other administrative examinations: Secondary | ICD-10-CM

## 2018-01-06 DIAGNOSIS — Z7901 Long term (current) use of anticoagulants: Secondary | ICD-10-CM

## 2018-01-06 DIAGNOSIS — Z7984 Long term (current) use of oral hypoglycemic drugs: Secondary | ICD-10-CM | POA: Insufficient documentation

## 2018-01-06 DIAGNOSIS — R42 Dizziness and giddiness: Secondary | ICD-10-CM | POA: Insufficient documentation

## 2018-01-06 DIAGNOSIS — D5911 Warm autoimmune hemolytic anemia: Secondary | ICD-10-CM

## 2018-01-06 DIAGNOSIS — D5919 Other autoimmune hemolytic anemia: Secondary | ICD-10-CM

## 2018-01-06 DIAGNOSIS — I10 Essential (primary) hypertension: Secondary | ICD-10-CM | POA: Diagnosis not present

## 2018-01-06 DIAGNOSIS — Z886 Allergy status to analgesic agent status: Secondary | ICD-10-CM | POA: Diagnosis not present

## 2018-01-06 DIAGNOSIS — I4821 Permanent atrial fibrillation: Secondary | ICD-10-CM

## 2018-01-06 LAB — CBC WITH DIFFERENTIAL (CANCER CENTER ONLY)
BASOS ABS: 0 10*3/uL (ref 0.0–0.1)
Basophils Relative: 0 %
EOS ABS: 0.1 10*3/uL (ref 0.0–0.5)
Eosinophils Relative: 1 %
HEMATOCRIT: 34.1 % — AB (ref 34.8–46.6)
HEMOGLOBIN: 11.4 g/dL — AB (ref 11.6–15.9)
LYMPHS ABS: 5.3 10*3/uL — AB (ref 0.9–3.3)
LYMPHS PCT: 52 %
MCH: 30 pg (ref 25.1–34.0)
MCHC: 33.4 g/dL (ref 31.5–36.0)
MCV: 89.7 fL (ref 79.5–101.0)
MONOS PCT: 11 %
Monocytes Absolute: 1.1 10*3/uL — ABNORMAL HIGH (ref 0.1–0.9)
Neutro Abs: 3.6 10*3/uL (ref 1.5–6.5)
Neutrophils Relative %: 36 %
Platelet Count: 205 10*3/uL (ref 145–400)
RBC: 3.8 MIL/uL (ref 3.70–5.45)
RDW: 16.9 % — ABNORMAL HIGH (ref 11.2–14.5)
WBC Count: 10.1 10*3/uL (ref 3.9–10.3)

## 2018-01-06 LAB — RETICULOCYTES
RBC.: 3.8 MIL/uL (ref 3.70–5.45)
Retic Count, Absolute: 83.6 10*3/uL (ref 33.7–90.7)
Retic Ct Pct: 2.2 % — ABNORMAL HIGH (ref 0.7–2.1)

## 2018-01-06 LAB — CMP (CANCER CENTER ONLY)
ALBUMIN: 3 g/dL — AB (ref 3.5–5.0)
ALT: 10 U/L (ref 0–44)
AST: 14 U/L — AB (ref 15–41)
Alkaline Phosphatase: 92 U/L (ref 38–126)
Anion gap: 13 (ref 5–15)
BUN: 29 mg/dL — ABNORMAL HIGH (ref 8–23)
CALCIUM: 8.6 mg/dL — AB (ref 8.9–10.3)
CHLORIDE: 103 mmol/L (ref 98–111)
CO2: 23 mmol/L (ref 22–32)
CREATININE: 1.82 mg/dL — AB (ref 0.44–1.00)
GFR, Est AFR Am: 30 mL/min — ABNORMAL LOW (ref >60)
GFR, Estimated: 25 mL/min — ABNORMAL LOW (ref >60)
GLUCOSE: 115 mg/dL — AB (ref 70–99)
Potassium: 4.6 mmol/L (ref 3.5–5.1)
SODIUM: 139 mmol/L (ref 135–145)
Total Bilirubin: 0.9 mg/dL (ref 0.3–1.2)
Total Protein: 6.5 g/dL (ref 6.5–8.1)

## 2018-01-06 LAB — LACTATE DEHYDROGENASE: LDH: 352 U/L — ABNORMAL HIGH (ref 98–192)

## 2018-01-06 LAB — POCT INR: INR: 2.2 (ref 2.0–3.0)

## 2018-01-06 MED ORDER — PREDNISONE 20 MG PO TABS: 20.0000 mg | ORAL_TABLET | Freq: Every day | ORAL | 0 refills | Status: DC

## 2018-01-06 NOTE — Assessment & Plan Note (Signed)
This is a very pleasant 77 year old African-American female recently diagnosed with autoimmune hemolytic anemia.  The patient is currently on treatment with prednisone 40 mg p.o. daily. CBC from today was reviewed with Dr. Arbutus Ped.  Hemoglobin is overall stable.  Recommend that she reduce the dose of her prednisone to 20 mg daily.  We will see her back in 2 weeks for evaluation and repeat lab work.  We will give her further instructions for tapering her prednisone dose at her next visit.  The patient was advised to call immediately if she has any concerning symptoms in the interval. The patient voices understanding of current disease status and treatment options and is in agreement with the current care plan.  All questions were answered. The patient knows to call the clinic with any problems, questions or concerns. We can certainly see the patient much sooner if necessary.

## 2018-01-06 NOTE — Telephone Encounter (Signed)
Gave pt avs and calendar  °

## 2018-01-06 NOTE — Patient Instructions (Addendum)
Pre visit review using our clinic review tool, if applicable. No additional management support is needed unless otherwise documented below in the visit note.  Continue to take 2.5 mg daily except 5 mg on Mon/Wed/ Fri.  Re-check in 2 weeks.   Dosing instructions given to Babb, RN  @ [facility] while in the home.  Re-check INR on Friday. [phone]

## 2018-01-11 DIAGNOSIS — Z7952 Long term (current) use of systemic steroids: Secondary | ICD-10-CM | POA: Diagnosis not present

## 2018-01-11 DIAGNOSIS — I480 Paroxysmal atrial fibrillation: Secondary | ICD-10-CM | POA: Diagnosis not present

## 2018-01-11 DIAGNOSIS — E1140 Type 2 diabetes mellitus with diabetic neuropathy, unspecified: Secondary | ICD-10-CM | POA: Diagnosis not present

## 2018-01-11 DIAGNOSIS — I129 Hypertensive chronic kidney disease with stage 1 through stage 4 chronic kidney disease, or unspecified chronic kidney disease: Secondary | ICD-10-CM | POA: Diagnosis not present

## 2018-01-11 DIAGNOSIS — I2510 Atherosclerotic heart disease of native coronary artery without angina pectoris: Secondary | ICD-10-CM | POA: Diagnosis not present

## 2018-01-11 DIAGNOSIS — D631 Anemia in chronic kidney disease: Secondary | ICD-10-CM | POA: Diagnosis not present

## 2018-01-11 DIAGNOSIS — T84032D Mechanical loosening of internal right knee prosthetic joint, subsequent encounter: Secondary | ICD-10-CM | POA: Diagnosis not present

## 2018-01-11 DIAGNOSIS — Z7901 Long term (current) use of anticoagulants: Secondary | ICD-10-CM | POA: Diagnosis not present

## 2018-01-11 DIAGNOSIS — Z5181 Encounter for therapeutic drug level monitoring: Secondary | ICD-10-CM | POA: Diagnosis not present

## 2018-01-11 DIAGNOSIS — G4733 Obstructive sleep apnea (adult) (pediatric): Secondary | ICD-10-CM | POA: Diagnosis not present

## 2018-01-11 DIAGNOSIS — E1122 Type 2 diabetes mellitus with diabetic chronic kidney disease: Secondary | ICD-10-CM | POA: Diagnosis not present

## 2018-01-11 DIAGNOSIS — N183 Chronic kidney disease, stage 3 (moderate): Secondary | ICD-10-CM | POA: Diagnosis not present

## 2018-01-11 NOTE — Progress Notes (Deleted)
Subjective:   Robin Snow is a 77 y.o. female who presents for Medicare Annual (Subsequent) preventive examination.  Review of Systems:  No ROS.  Medicare Wellness Visit. Additional risk factors are reflected in the social history.    Sleep patterns: {SX; SLEEP PATTERNS:18802::"feels rested on waking","does not get up to void","gets up *** times nightly to void","sleeps *** hours nightly"}.    Home Safety/Smoke Alarms: Feels safe in home. Smoke alarms in place.  Living environment; residence and Firearm Safety: {Rehab home environment / accessibility:30080::"no firearms","firearms stored safely"}. Seat Belt Safety/Bike Helmet: Wears seat belt.      Objective:     Vitals: There were no vitals taken for this visit.  There is no height or weight on file to calculate BMI.  Advanced Directives 12/15/2017 02/22/2017 02/22/2017 01/06/2017 10/09/2016 09/08/2016 09/08/2016  Does Patient Have a Medical Advance Directive? Yes Yes No No No Yes Yes  Type of Advance Directive Living will;Healthcare Power of Attorney Living will;Healthcare Power of Attorney - - - Living will Living will  Does patient want to make changes to medical advance directive? - No - Patient declined - - - No - Patient declined -  Copy of Healthcare Power of Attorney in Chart? Yes Yes - - - - -  Would patient like information on creating a medical advance directive? - - - Yes (ED - Information included in AVS) No - Patient declined - -  Pre-existing out of facility DNR order (yellow form or pink MOST form) - - - - - - -    Tobacco Social History   Tobacco Use  Smoking Status Former Smoker  . Packs/day: 0.12  . Years: 34.00  . Pack years: 4.08  . Types: Cigarettes  . Last attempt to quit: 07/07/1993  . Years since quitting: 24.5  Smokeless Tobacco Never Used     Counseling given: Not Answered  Past Medical History:  Diagnosis Date  . AKI (acute kidney injury) (HCC) 04/2016  . Anemia   . Arthritis   . Chronic  atrial fibrillation   . Chronic edema   . Chronic venous insufficiency   . CKD (chronic kidney disease), stage III (HCC)   . Coagulopathy (HCC)   . Diabetes mellitus    Type 2  . Diverticulosis   . Dyslipidemia   . GERD (gastroesophageal reflux disease)   . GI bleed 2015   a. lower GI from diverticular source 2015.  [name] Hx of transfusion of packed red blood cells   . Hypertension   . Morbid obesity (HCC)   . Obstructive sleep apnea    on CPAP  . Pulmonary hypertension (HCC)   . Pyelonephritis 04/2016  . Renal infarct (HCC)    a. 04/2016- adm with flank pain, ? pyelo vs renal infarct in setting of recent subtherapeutic INR.  [name] Sinus bradycardia   . Stroke [facility]) 2015   Past Surgical History:  Procedure Laterality Date  . CESAREAN SECTION    . COLONOSCOPY Left 01/24/2013   Procedure: COLONOSCOPY;  Surgeon: Willis Modena, MD;  Location[facility] ENDOSCOPY;  Service: Endoscopy;  Laterality: Left;  . ESOPHAGOGASTRODUODENOSCOPY Left 01/23/2013   Procedure: ESOPHAGOGASTRODUODENOSCOPY (EGD);  Surgeon: Willis Modena, MD;  Location: [facility] ENDOSCOPY;  Service: Endoscopy;  Laterality: Left;  . EYE SURGERY Bilateral    cataract surgery  . JOINT REPLACEMENT    . PACEMAKER IMPLANT [name] 09/09/2016   Procedure: Pacemaker Implant;  Surgeon: Marinus Maw, MD;  Location: [facility] INVASIVE CV LAB;  Service: Cardiovascular;  Laterality: [name];  . REPLACEMENT TOTAL KNEE BILATERAL    . TUBAL LIGATION     Family History  Problem Relation Age of Onset  . Cancer Father   . Stroke Maternal Aunt    Social History   Socioeconomic History  . Marital status: Widowed    Spouse name: Not on file  . Number of children: 4  . Years of education: 50  . Highest education level: Not on file  Occupational History  . Not on file  Social Needs  . Financial resource strain: Not on file  . Food insecurity:    Worry: Not on file    Inability: Not on file  . Transportation needs:    Medical: Not on file    Non-medical:  Not on file  Tobacco Use  . Smoking status: Former Smoker    Packs/day: 0.12    Years: 34.00    Pack years: 4.08    Types: Cigarettes    Last attempt to quit: 07/07/1993    Years since quitting: 24.5  . Smokeless tobacco: Never Used  Substance and Sexual Activity  . Alcohol use: No    Comment: no longer drinks alcohol  . Drug use: No  . Sexual activity: Never  Lifestyle  . Physical activity:    Days per week: Not on file    Minutes per session: Not on file  . Stress: Not on file  Relationships  . Social connections:    Talks on phone: Not on file    Gets together: Not on file    Attends religious service: Not on file    Active member of club or organization: Not on file    Attends meetings of clubs or organizations: Not on file    Relationship status: Not on file  Other Topics Concern  . Not on file  Social History Narrative   Patient is widowed and has 4 living children, and 2 deceased children.   Patient is right handed.   Patient has hs education.   Patient drinks coffee and soda once a week.    Outpatient Encounter Medications as of 01/12/2018  Medication Sig  . acetaminophen (TYLENOL) 500 MG tablet Take by mouth.  . CARTIA XT 180 MG 24 hr capsule TAKE 1 CAPSULE BY MOUTH ONCE DAILY  . ferrous sulfate 325 (65 FE) MG EC tablet TAKE ONE TABLET BY MOUTH TWICE DAILY  . furosemide (LASIX) 40 MG tablet   . gabapentin (NEURONTIN) 300 MG capsule TAKE 1 CAPSULE THREE TIMES DAILY  . glimepiride (AMARYL) 2 MG tablet   . hydrochlorothiazide (HYDRODIURIL) 12.5 MG tablet   . HYDROmorphone (DILAUDID) 2 MG tablet   . lisinopril (PRINIVIL,ZESTRIL) 20 MG tablet   . lovastatin (MEVACOR) 20 MG tablet Take 1 tablet (20 mg total) by mouth at bedtime.  . meclizine (ANTIVERT) 25 MG tablet Take 1 tablet (25 mg total) by mouth every 4 (four) hours as needed for dizziness.  . metoprolol tartrate (LOPRESSOR) 50 MG tablet Take 1 tablet (50 mg total) by mouth 2 (two) times daily.  . potassium  chloride SA (K-DUR,KLOR-CON) 20 MEQ tablet Take 1 tablet (20 mEq total) by mouth as directed. Take 2 tabs (40 meq) in the AM and 1 tab (20 meq) in the PM  . predniSONE (DELTASONE) 20 MG tablet Take 1 tablet (20 mg total) by mouth daily with breakfast.  . Respiratory Therapy Supplies MISC by Does not apply route.  . senna-docusate (SENOKOT-S) 8.6-50 MG tablet Take by mouth.  [name]  torsemide (DEMADEX) 20 MG tablet Take 1 tablet (20 mg total) by mouth 2 (two) times daily.  [name] warfarin (COUMADIN) 5 MG tablet Take 1 tablet Mon/Wed/Fridays and 1/2 tablet all other days OR AS DIRECTED - 90 day   No facility-administered encounter medications on file as of 01/12/2018.     Activities of Daily Living In your present state of health, do you have any difficulty performing the following activities: 02/22/2017  Hearing? Y  Vision? N  Difficulty concentrating or making decisions? N  Walking or climbing stairs? Y  Comment "I don't do stairs"  Dressing or bathing? N  Doing errands, shopping? Y  Comment "I don't drive"  Some recent data might be hidden    Patient Care Team: [name], MD as PCP - General (Internal Medicine) Marinus Maw, MD as PCP - Cardiology (Cardiology)    Assessment:   This is a routine wellness examination for [place]. Physical assessment deferred to PCP.   Exercise Activities and Dietary recommendations   Diet (meal preparation, eat out, water intake, caffeinated beverages, dairy products, fruits and vegetables): {Desc; diets:16563}  Goals      Patient Stated   . patient (pt-stated)     Wants to be independent; Keep moving;  [facility] [facility] classes or [facility] / can get pool for water aerobics      Other   . Stay as active and as independent as possible     Continue to eat healthy and increase my physical activity by doing chair exercises.       Fall Risk Fall Risk  01/06/2017 03/20/2016 05/31/2015 03/22/2015 03/15/2015  Falls in the past year? No No Yes No No    Risk for fall due to : Impaired balance/gait;Impaired mobility - Impaired mobility;Impaired balance/gait - -   Depression Screen PHQ 2/9 Scores 01/06/2017 05/31/2015 03/15/2015  PHQ - 2 Score 0 0 0  PHQ- 9 Score 0 - -     Cognitive Function MMSE - Mini Mental State Exam 01/06/2017  Orientation to time 5  Orientation to Place 5  Registration 3  Attention/ Calculation 5  Recall 1  Language- name 2 objects 2  Language- repeat 1  Language- follow 3 step command 3  Language- read & follow direction 1  Write a sentence 1  Copy design 0  Total score 27        Immunization History  Administered Date(s) Administered  . Influenza, High Dose Seasonal PF 05/25/2015, 01/06/2017  . Influenza,inj,Quad PF,6+ Mos 06/01/2013, 04/14/2016  . Pneumococcal Conjugate-13 03/12/2017   Screening Tests Health Maintenance  Topic Date Due  . OPHTHALMOLOGY EXAM  09/01/1949  . TETANUS/TDAP  09/02/1958  . INFLUENZA VACCINE  02/12/2018 (Originally 11/04/2017)  . FOOT EXAM  03/12/2018  . PNA vac Low Risk Adult (2 of 2 - PPSV23) 03/12/2018  . COLONOSCOPY  07/19/2018  . DEXA SCAN  Completed      Plan:     I have personally reviewed and noted the following in the patient's chart:   . Medical and social history . Use of alcohol, tobacco or illicit drugs  . Current medications and supplements . Functional ability and status . Nutritional status . Physical activity . Advanced directives . List of other physicians . Vitals . Screenings to include cognitive, depression, and falls . Referrals and appointments  In addition, I have reviewed and discussed with patient certain preventive protocols, quality metrics, and best practice recommendations. A written personalized care plan for preventive services as well  as general preventive health recommendations were provided to patient.     Wanda Plump, RN  01/11/2018

## 2018-01-12 ENCOUNTER — Ambulatory Visit: Payer: Medicare Other

## 2018-01-19 ENCOUNTER — Other Ambulatory Visit: Payer: Self-pay | Admitting: Internal Medicine

## 2018-01-20 ENCOUNTER — Encounter: Payer: Self-pay | Admitting: Nurse Practitioner

## 2018-01-20 ENCOUNTER — Inpatient Hospital Stay (HOSPITAL_BASED_OUTPATIENT_CLINIC_OR_DEPARTMENT_OTHER): Payer: Medicare Other | Admitting: Nurse Practitioner

## 2018-01-20 ENCOUNTER — Telehealth: Payer: Self-pay

## 2018-01-20 ENCOUNTER — Inpatient Hospital Stay: Payer: Medicare Other

## 2018-01-20 VITALS — BP 132/73 | HR 83 | Temp 97.8°F | Resp 18 | Ht 67.0 in | Wt 249.8 lb

## 2018-01-20 DIAGNOSIS — I10 Essential (primary) hypertension: Secondary | ICD-10-CM | POA: Diagnosis not present

## 2018-01-20 DIAGNOSIS — Z886 Allergy status to analgesic agent status: Secondary | ICD-10-CM | POA: Diagnosis not present

## 2018-01-20 DIAGNOSIS — D591 Other autoimmune hemolytic anemias: Principal | ICD-10-CM

## 2018-01-20 DIAGNOSIS — Z7984 Long term (current) use of oral hypoglycemic drugs: Secondary | ICD-10-CM

## 2018-01-20 DIAGNOSIS — Z885 Allergy status to narcotic agent status: Secondary | ICD-10-CM

## 2018-01-20 DIAGNOSIS — E119 Type 2 diabetes mellitus without complications: Secondary | ICD-10-CM | POA: Diagnosis not present

## 2018-01-20 DIAGNOSIS — D5919 Other autoimmune hemolytic anemia: Secondary | ICD-10-CM

## 2018-01-20 DIAGNOSIS — R42 Dizziness and giddiness: Secondary | ICD-10-CM | POA: Diagnosis not present

## 2018-01-20 DIAGNOSIS — Z79899 Other long term (current) drug therapy: Secondary | ICD-10-CM

## 2018-01-20 DIAGNOSIS — Z8673 Personal history of transient ischemic attack (TIA), and cerebral infarction without residual deficits: Secondary | ICD-10-CM

## 2018-01-20 DIAGNOSIS — Z7901 Long term (current) use of anticoagulants: Secondary | ICD-10-CM

## 2018-01-20 DIAGNOSIS — D5911 Warm autoimmune hemolytic anemia: Secondary | ICD-10-CM

## 2018-01-20 LAB — CBC WITH DIFFERENTIAL (CANCER CENTER ONLY)
ABS IMMATURE GRANULOCYTES: 0.16 10*3/uL — AB (ref 0.00–0.07)
Basophils Absolute: 0 10*3/uL (ref 0.0–0.1)
Basophils Relative: 0 %
Eosinophils Absolute: 0.1 10*3/uL (ref 0.0–0.5)
Eosinophils Relative: 1 %
HEMATOCRIT: 34 % — AB (ref 36.0–46.0)
Hemoglobin: 11 g/dL — ABNORMAL LOW (ref 12.0–15.0)
IMMATURE GRANULOCYTES: 2 %
LYMPHS ABS: 3.4 10*3/uL (ref 0.7–4.0)
Lymphocytes Relative: 34 %
MCH: 29.4 pg (ref 26.0–34.0)
MCHC: 32.4 g/dL (ref 30.0–36.0)
MCV: 90.9 fL (ref 80.0–100.0)
Monocytes Absolute: 1.1 10*3/uL — ABNORMAL HIGH (ref 0.1–1.0)
Monocytes Relative: 11 %
NEUTROS ABS: 5.4 10*3/uL (ref 1.7–7.7)
NEUTROS PCT: 52 %
PLATELETS: 165 10*3/uL (ref 150–400)
RBC: 3.74 MIL/uL — ABNORMAL LOW (ref 3.87–5.11)
RDW: 15.9 % — ABNORMAL HIGH (ref 11.5–15.5)
WBC Count: 10.2 10*3/uL (ref 4.0–10.5)
nRBC: 0 % (ref 0.0–0.2)

## 2018-01-20 LAB — RETICULOCYTES
IMMATURE RETIC FRACT: 20.1 % — AB (ref 2.3–15.9)
RBC.: 3.74 MIL/uL — ABNORMAL LOW (ref 3.87–5.11)
Retic Count, Absolute: 65.5 10*3/uL (ref 19.0–186.0)
Retic Ct Pct: 1.8 % (ref 0.4–3.1)

## 2018-01-20 LAB — CMP (CANCER CENTER ONLY)
ALBUMIN: 3.2 g/dL — AB (ref 3.5–5.0)
ALK PHOS: 78 U/L (ref 38–126)
ALT: 8 U/L (ref 0–44)
ANION GAP: 11 (ref 5–15)
AST: 10 U/L — ABNORMAL LOW (ref 15–41)
BUN: 20 mg/dL (ref 8–23)
CHLORIDE: 105 mmol/L (ref 98–111)
CO2: 27 mmol/L (ref 22–32)
Calcium: 9.3 mg/dL (ref 8.9–10.3)
Creatinine: 1.42 mg/dL — ABNORMAL HIGH (ref 0.44–1.00)
GFR, Est AFR Am: 40 mL/min — ABNORMAL LOW (ref >60)
GFR, Estimated: 34 mL/min — ABNORMAL LOW (ref >60)
GLUCOSE: 110 mg/dL — AB (ref 70–99)
POTASSIUM: 4 mmol/L (ref 3.5–5.1)
SODIUM: 143 mmol/L (ref 135–145)
Total Bilirubin: 0.7 mg/dL (ref 0.3–1.2)
Total Protein: 7.4 g/dL (ref 6.5–8.1)

## 2018-01-20 LAB — LACTATE DEHYDROGENASE: LDH: 235 U/L — ABNORMAL HIGH (ref 98–192)

## 2018-01-20 NOTE — Progress Notes (Signed)
  [facility] OFFICE PROGRESS NOTE   Diagnosis: Autoimmune hemolytic anemia  INTERVAL HISTORY:   Ms. Wisnewski returns as scheduled.  Prednisone was tapered to 20 mg daily beginning 01/06/2018.  She feels overall she is tolerating the prednisone well.  She does not note difficulty sleeping.  She is jittery at times.  No change in baseline leg swelling.  No mouth sores.  She denies shortness of breath.  She has a good appetite.  Objective:  Vital signs in last 24 hours:  Blood pressure 132/73, pulse 83, temperature 97.8 F (36.6 C), temperature source Oral, resp. rate 18, height 5\' 7"  (1.702 m), weight 249 lb 12.8 oz (113.3 kg), SpO2 100 %.    HEENT: No thrush or ulcers. Resp: Lungs clear bilaterally. Cardio: Regular rate and rhythm. GI: Abdomen soft and nontender. Vascular: Firm edema lower legs. Neuro: Alert and oriented.    Lab Results:  Lab Results  Component Value Date   WBC 10.2 01/20/2018   HGB 11.0 (L) 01/20/2018   HCT 34.0 (L) 01/20/2018   MCV 90.9 01/20/2018   PLT 165 01/20/2018   NEUTROABS 5.4 01/20/2018    Imaging:  No results found.  Medications: I have reviewed the patient's current medications.  Assessment/Plan: 1. Autoimmune hemolytic anemia currently completing a prednisone taper  Disposition: Ms. Zmuda is a 77 year old woman with autoimmune hemolytic anemia.  She is currently on prednisone 20 mg daily.  I reviewed the CBC from today with Dr. Arbutus Ped.  Hemoglobin overall is stable.  She will decrease prednisone to 10 mg daily.  She will return in 2 weeks for repeat labs and a follow-up visit.  She will contact the office in the interim with any problems.  Plan reviewed with Dr. Arbutus Ped.    Lonna Cobb ANP/GNP-BC   01/20/2018  12:53 PM

## 2018-01-20 NOTE — Telephone Encounter (Signed)
Printed avs and calender of upcoming appointment. Per 10/17 los 

## 2018-01-21 ENCOUNTER — Other Ambulatory Visit: Payer: Self-pay | Admitting: Oncology

## 2018-01-21 DIAGNOSIS — D649 Anemia, unspecified: Secondary | ICD-10-CM

## 2018-01-24 DIAGNOSIS — Z471 Aftercare following joint replacement surgery: Secondary | ICD-10-CM | POA: Diagnosis not present

## 2018-02-03 ENCOUNTER — Telehealth: Payer: Self-pay | Admitting: Internal Medicine

## 2018-02-03 ENCOUNTER — Inpatient Hospital Stay: Payer: Medicare Other

## 2018-02-03 ENCOUNTER — Encounter: Payer: Self-pay | Admitting: Oncology

## 2018-02-03 ENCOUNTER — Inpatient Hospital Stay (HOSPITAL_BASED_OUTPATIENT_CLINIC_OR_DEPARTMENT_OTHER): Payer: Medicare Other | Admitting: Oncology

## 2018-02-03 VITALS — BP 126/70 | HR 84 | Temp 98.3°F | Resp 18 | Ht 65.0 in | Wt 254.6 lb

## 2018-02-03 DIAGNOSIS — R42 Dizziness and giddiness: Secondary | ICD-10-CM | POA: Diagnosis not present

## 2018-02-03 DIAGNOSIS — Z7952 Long term (current) use of systemic steroids: Secondary | ICD-10-CM

## 2018-02-03 DIAGNOSIS — D591 Other autoimmune hemolytic anemias: Secondary | ICD-10-CM

## 2018-02-03 DIAGNOSIS — Z7901 Long term (current) use of anticoagulants: Secondary | ICD-10-CM | POA: Diagnosis not present

## 2018-02-03 DIAGNOSIS — Z7984 Long term (current) use of oral hypoglycemic drugs: Secondary | ICD-10-CM | POA: Diagnosis not present

## 2018-02-03 DIAGNOSIS — D5911 Warm autoimmune hemolytic anemia: Secondary | ICD-10-CM

## 2018-02-03 DIAGNOSIS — Z885 Allergy status to narcotic agent status: Secondary | ICD-10-CM | POA: Diagnosis not present

## 2018-02-03 DIAGNOSIS — Z8673 Personal history of transient ischemic attack (TIA), and cerebral infarction without residual deficits: Secondary | ICD-10-CM | POA: Diagnosis not present

## 2018-02-03 DIAGNOSIS — Z79899 Other long term (current) drug therapy: Secondary | ICD-10-CM | POA: Diagnosis not present

## 2018-02-03 DIAGNOSIS — I10 Essential (primary) hypertension: Secondary | ICD-10-CM | POA: Diagnosis not present

## 2018-02-03 DIAGNOSIS — E119 Type 2 diabetes mellitus without complications: Secondary | ICD-10-CM | POA: Diagnosis not present

## 2018-02-03 DIAGNOSIS — Z886 Allergy status to analgesic agent status: Secondary | ICD-10-CM | POA: Diagnosis not present

## 2018-02-03 DIAGNOSIS — D649 Anemia, unspecified: Secondary | ICD-10-CM

## 2018-02-03 LAB — RETICULOCYTES
IMMATURE RETIC FRACT: 21.6 % — AB (ref 2.3–15.9)
RBC.: 3.58 MIL/uL — ABNORMAL LOW (ref 3.87–5.11)
RETIC CT PCT: 2.1 % (ref 0.4–3.1)
Retic Count, Absolute: 76.3 10*3/uL (ref 19.0–186.0)

## 2018-02-03 LAB — CMP (CANCER CENTER ONLY)
ALBUMIN: 3.1 g/dL — AB (ref 3.5–5.0)
ALK PHOS: 67 U/L (ref 38–126)
ALT: 8 U/L (ref 0–44)
ANION GAP: 11 (ref 5–15)
AST: 8 U/L — ABNORMAL LOW (ref 15–41)
BUN: 31 mg/dL — ABNORMAL HIGH (ref 8–23)
CO2: 33 mmol/L — AB (ref 22–32)
CREATININE: 1.48 mg/dL — AB (ref 0.44–1.00)
Calcium: 8.9 mg/dL (ref 8.9–10.3)
Chloride: 103 mmol/L (ref 98–111)
GFR, EST AFRICAN AMERICAN: 38 mL/min — AB (ref >60)
GFR, Estimated: 33 mL/min — ABNORMAL LOW (ref >60)
GLUCOSE: 90 mg/dL (ref 70–99)
Potassium: 4 mmol/L (ref 3.5–5.1)
SODIUM: 147 mmol/L — AB (ref 135–145)
Total Bilirubin: 0.8 mg/dL (ref 0.3–1.2)
Total Protein: 6.5 g/dL (ref 6.5–8.1)

## 2018-02-03 LAB — CBC WITH DIFFERENTIAL (CANCER CENTER ONLY)
Abs Immature Granulocytes: 0.08 10*3/uL — ABNORMAL HIGH (ref 0.00–0.07)
Basophils Absolute: 0 10*3/uL (ref 0.0–0.1)
Basophils Relative: 0 %
EOS ABS: 0.1 10*3/uL (ref 0.0–0.5)
EOS PCT: 1 %
HEMATOCRIT: 33.6 % — AB (ref 36.0–46.0)
Hemoglobin: 10.6 g/dL — ABNORMAL LOW (ref 12.0–15.0)
IMMATURE GRANULOCYTES: 1 %
LYMPHS ABS: 4.1 10*3/uL — AB (ref 0.7–4.0)
Lymphocytes Relative: 46 %
MCH: 29.6 pg (ref 26.0–34.0)
MCHC: 31.5 g/dL (ref 30.0–36.0)
MCV: 93.9 fL (ref 80.0–100.0)
MONOS PCT: 10 %
Monocytes Absolute: 0.8 10*3/uL (ref 0.1–1.0)
NEUTROS PCT: 42 %
Neutro Abs: 3.7 10*3/uL (ref 1.7–7.7)
Platelet Count: 152 10*3/uL (ref 150–400)
RBC: 3.58 MIL/uL — ABNORMAL LOW (ref 3.87–5.11)
RDW: 15.9 % — AB (ref 11.5–15.5)
WBC Count: 8.9 10*3/uL (ref 4.0–10.5)
nRBC: 0.2 % (ref 0.0–0.2)

## 2018-02-03 LAB — LACTATE DEHYDROGENASE: LDH: 185 U/L (ref 98–192)

## 2018-02-03 NOTE — Progress Notes (Signed)
[facility] OFFICE PROGRESS NOTE  [name], MD [address] [place] [address]  DIAGNOSIS: Autoimmune hemolytic anemia.  PRIOR THERAPY: None  CURRENT THERAPY: Prednisone taper.  Current dose is 10 mg daily.  INTERVAL HISTORY: Robin Snow 77 y.o. female returns for routine follow-up visit accompanied by her daughter.  The patient is feeling fine today and has no specific complaints.  She is tolerating her prednisone fairly well.  She does not have any difficulty sleeping.  Eating well.  She has her baseline lower extremity edema.  Denies fevers and chills.  Denies chest pain, shortness of breath, cough, hemoptysis.  Denies nausea, vomiting, constipation, diarrhea.  Denies recent weight loss or night sweats.  The patient is here for evaluation and repeat lab work.  MEDICAL HISTORY: Past Medical History:  Diagnosis Date  . AKI (acute kidney injury) (HCC) 04/2016  . Anemia   . Arthritis   . Chronic atrial fibrillation   . Chronic edema   . Chronic venous insufficiency   . CKD (chronic kidney disease), stage III (HCC)   . Coagulopathy (HCC)   . Diabetes mellitus    Type 2  . Diverticulosis   . Dyslipidemia   . GERD (gastroesophageal reflux disease)   . GI bleed 2015   a. lower GI from diverticular source 2015.  [name] Hx of transfusion of packed red blood cells   . Hypertension   . Morbid obesity (HCC)   . Obstructive sleep apnea    on CPAP  . Pulmonary hypertension (HCC)   . Pyelonephritis 04/2016  . Renal infarct (HCC)    a. 04/2016- adm with flank pain, ? pyelo vs renal infarct in setting of recent subtherapeutic INR.  [name] Sinus bradycardia   . Stroke [facility]) 2015    ALLERGIES:  is allergic to aspirin; rofecoxib; and hydrocodone.  MEDICATIONS:  Current Outpatient Medications  Medication Sig Dispense Refill  . acetaminophen (TYLENOL) 500 MG tablet Take by mouth.    . CARTIA XT 180 MG 24 hr capsule TAKE 1 CAPSULE BY MOUTH ONCE DAILY 90  capsule 0  . ferrous sulfate 325 (65 FE) MG EC tablet TAKE ONE TABLET BY MOUTH TWICE DAILY 60 tablet 6  . furosemide (LASIX) 40 MG tablet     . gabapentin (NEURONTIN) 300 MG capsule TAKE 1 CAPSULE BY MOUTH THREE TIMES DAILY 270 capsule 0  . glimepiride (AMARYL) 2 MG tablet     . hydrochlorothiazide (HYDRODIURIL) 12.5 MG tablet     . HYDROmorphone (DILAUDID) 2 MG tablet     . lisinopril (PRINIVIL,ZESTRIL) 20 MG tablet     . lovastatin (MEVACOR) 20 MG tablet TAKE 1 TABLET BY MOUTH AT BEDTIME 30 tablet 0  . meclizine (ANTIVERT) 25 MG tablet Take 1 tablet (25 mg total) by mouth every 4 (four) hours as needed for dizziness. 60 tablet 0  . potassium chloride SA (K-DUR,KLOR-CON) 20 MEQ tablet Take 1 tablet (20 mEq total) by mouth as directed. Take 2 tabs (40 meq) in the AM and 1 tab (20 meq) in the PM 135 tablet 3  . predniSONE (DELTASONE) 20 MG tablet Take 1 tablet (20 mg total) by mouth daily with breakfast. 30 tablet 0  . Respiratory Therapy Supplies MISC by Does not apply route.    . senna-docusate (SENOKOT-S) 8.6-50 MG tablet Take by mouth.    . torsemide (DEMADEX) 20 MG tablet Take 1 tablet (20 mg total) by mouth 2 (two) times daily. 60 tablet 5  .  warfarin (COUMADIN) 5 MG tablet Take 1 tablet Mon/Wed/Fridays and 1/2 tablet all other days OR AS DIRECTED - 90 day 90 tablet 1  . metoprolol tartrate (LOPRESSOR) 50 MG tablet Take 1 tablet (50 mg total) by mouth 2 (two) times daily. 180 tablet 3   No current facility-administered medications for this visit.     SURGICAL HISTORY:  Past Surgical History:  Procedure Laterality Date  . CESAREAN SECTION    . COLONOSCOPY Left 01/24/2013   Procedure: COLONOSCOPY;  Surgeon: Willis Modena, MD;  Location: [facility] ENDOSCOPY;  Service: Endoscopy;  Laterality: Left;  . ESOPHAGOGASTRODUODENOSCOPY Left 01/23/2013   Procedure: ESOPHAGOGASTRODUODENOSCOPY (EGD);  Surgeon: Willis Modena, MD;  Location: [facility] ENDOSCOPY;  Service: Endoscopy;  Laterality: Left;  . EYE  SURGERY Bilateral    cataract surgery  . JOINT REPLACEMENT    . PACEMAKER IMPLANT [name] 09/09/2016   Procedure: Pacemaker Implant;  Surgeon: Marinus Maw, MD;  Location: [facility] INVASIVE CV LAB;  Service: Cardiovascular;  Laterality: [name];  . REPLACEMENT TOTAL KNEE BILATERAL    . TUBAL LIGATION      REVIEW OF SYSTEMS:   Review of Systems  Constitutional: Negative for appetite change, chills, fatigue, fever and unexpected weight change.  HENT:   Negative for mouth sores, nosebleeds, sore throat and trouble swallowing.   Eyes: Negative for eye problems and icterus.  Respiratory: Negative for cough, hemoptysis, shortness of breath and wheezing.   Cardiovascular: Negative for chest pain.  Positive for her baseline lower extremity edema. Gastrointestinal: Negative for abdominal pain, constipation, diarrhea, nausea and vomiting.  Genitourinary: Negative for bladder incontinence, difficulty urinating, dysuria, frequency and hematuria.   Musculoskeletal: Negative for back pain, gait problem, neck pain and neck stiffness.  Skin: Negative for itching and rash.  Neurological: Negative for dizziness, extremity weakness, gait problem, headaches, light-headedness and seizures.  Hematological: Negative for adenopathy. Does not bruise/bleed easily.  Psychiatric/Behavioral: Negative for confusion, depression and sleep disturbance. The patient is not nervous/anxious.     PHYSICAL EXAMINATION:  Blood pressure 126/70, pulse 84, temperature 98.3 F (36.8 C), temperature source Oral, resp. rate 18, height 5\' 5"  (1.651 m), weight 254 lb 9.6 oz (115.5 kg), SpO2 100 %.  ECOG PERFORMANCE STATUS: 1 - Symptomatic but completely ambulatory  Physical Exam  Constitutional: Oriented to person, place, and time and well-developed, well-nourished, and in no distress. No distress.  HENT:  Head: Normocephalic and atraumatic.  Mouth/Throat: Oropharynx is clear and moist. No oropharyngeal exudate.  Eyes: Conjunctivae are  normal. Right eye exhibits no discharge. Left eye exhibits no discharge. No scleral icterus.  Neck: Normal range of motion. Neck supple.  Cardiovascular: Normal rate, regular rhythm, normal heart sounds and intact distal pulses.   Pulmonary/Chest: Effort normal and breath sounds normal. No respiratory distress. No wheezes. No rales.  Abdominal: Soft. Bowel sounds are normal. Exhibits no distension and no mass. There is no tenderness.  Musculoskeletal: Normal range of motion. Exhibits no edema.  Lymphadenopathy:    No cervical adenopathy.  Neurological: Alert and oriented to person, place, and time. Exhibits normal muscle tone. Gait normal. Coordination normal.  Skin: Skin is warm and dry. No rash noted. Not diaphoretic. No erythema. No pallor.  Psychiatric: Mood, memory and judgment normal.  Vitals reviewed.  LABORATORY DATA: Lab Results  Component Value Date   WBC 8.9 02/03/2018   HGB 10.6 (L) 02/03/2018   HCT 33.6 (L) 02/03/2018   MCV 93.9 02/03/2018   PLT 152 02/03/2018      Chemistry  Component Value Date/Time   NA 147 (H) 02/03/2018 0940   NA 139 08/19/2016 1010   K 4.0 02/03/2018 0940   CL 103 02/03/2018 0940   CO2 33 (H) 02/03/2018 0940   BUN 31 (H) 02/03/2018 0940   BUN 25 08/19/2016 1010   CREATININE 1.48 (H) 02/03/2018 0940      Component Value Date/Time   CALCIUM 8.9 02/03/2018 0940   ALKPHOS 67 02/03/2018 0940   AST 8 (L) 02/03/2018 0940   ALT 8 02/03/2018 0940   BILITOT 0.8 02/03/2018 0940       RADIOGRAPHIC STUDIES:  No results found.   ASSESSMENT/PLAN:  Warm antibody hemolytic anemia (HCC) This is a very pleasant 77 year old African-American female recently diagnosed with autoimmune hemolytic anemia. The patient is currently on treatment with prednisone 10 mg p.o. daily. CBC from today was reviewed with Dr. Arbutus Ped.  Hemoglobin is overall stable but down slightly.  We will continue prednisone 10 mg daily for now.  We will recheck a CBC, CMET,  LDH, and haptoglobin in approximately 2 weeks.  The patient will follow-up in 2 weeks for evaluation and repeat lab work.  The patient was advised to call immediately if she has any concerning symptoms in the interval. The patient voices understanding of current disease status and treatment options and is in agreement with the current care plan.  All questions were answered. The patient knows to call the clinic with any problems, questions or concerns. We can certainly see the patient much sooner if necessary.   Orders Placed This Encounter  Procedures  . CBC with Differential (Cancer Center Only)    Standing Status:   Future    Standing Expiration Date:   02/04/2019  . CMP (Cancer Center only)    Standing Status:   Future    Standing Expiration Date:   02/04/2019  . Lactate dehydrogenase    Standing Status:   Future    Standing Expiration Date:   02/04/2019  . Haptoglobin    Standing Status:   Future    Standing Expiration Date:   02/03/2019     Clenton Pare, DNP, AGPCNP-BC, AOCNP 02/03/18

## 2018-02-03 NOTE — Assessment & Plan Note (Addendum)
This is a very pleasant 77 year old African-American female recently diagnosed with autoimmune hemolytic anemia. The patient is currently on treatment with prednisone 10 mg p.o. daily. CBC from today was reviewed with Dr. Arbutus Ped.  Hemoglobin is overall stable but down slightly.  We will continue prednisone 10 mg daily for now.  We will recheck a CBC, CMET, LDH, and haptoglobin in approximately 2 weeks.  The patient will follow-up in 2 weeks for evaluation and repeat lab work.  The patient was advised to call immediately if she has any concerning symptoms in the interval. The patient voices understanding of current disease status and treatment options and is in agreement with the current care plan.  All questions were answered. The patient knows to call the clinic with any problems, questions or concerns. We can certainly see the patient much sooner if necessary.

## 2018-02-03 NOTE — Telephone Encounter (Signed)
Appts scheduled avs declined/calendar printed pe r10/31 los

## 2018-02-08 ENCOUNTER — Telehealth: Payer: Self-pay | Admitting: General Practice

## 2018-02-08 NOTE — Telephone Encounter (Signed)
LMOVM for patient to call Bailey Mech, RN @ [phone] to set up INR.

## 2018-02-15 ENCOUNTER — Encounter: Payer: Self-pay | Admitting: Internal Medicine

## 2018-02-15 ENCOUNTER — Ambulatory Visit (INDEPENDENT_AMBULATORY_CARE_PROVIDER_SITE_OTHER): Payer: Medicare Other | Admitting: General Practice

## 2018-02-15 ENCOUNTER — Ambulatory Visit (INDEPENDENT_AMBULATORY_CARE_PROVIDER_SITE_OTHER): Payer: Medicare Other | Admitting: Internal Medicine

## 2018-02-15 DIAGNOSIS — Z7901 Long term (current) use of anticoagulants: Secondary | ICD-10-CM | POA: Diagnosis not present

## 2018-02-15 DIAGNOSIS — I872 Venous insufficiency (chronic) (peripheral): Secondary | ICD-10-CM | POA: Diagnosis not present

## 2018-02-15 LAB — POCT INR: INR: 1 — AB (ref 2.0–3.0)

## 2018-02-15 MED ORDER — FUROSEMIDE 40 MG PO TABS: 40.0000 mg | ORAL_TABLET | Freq: Two times a day (BID) | ORAL | 3 refills | Status: DC

## 2018-02-15 NOTE — Patient Instructions (Addendum)
We will switch back to lasix instead (furosemide). Take 1 pill twice a day for the first week. Then decrease down to 1 pill daily if you can.  Call us if this is not working.

## 2018-02-15 NOTE — Assessment & Plan Note (Signed)
Will switch from torsemide to lasix 40 mg BID for 1 week then decrease to 40 mg daily. She does have a significant amount of edema on exam. Informed her that likely the left would be more swollen than the right given recent surgery.

## 2018-02-15 NOTE — Progress Notes (Signed)
   Subjective:    Patient ID: Robin Snow, female    DOB: 1940-04-30, 77 y.o.   MRN: [id number]  HPI The patient is a 77 YO female coming in for swelling in her feet and legs. She has been having this worse for several weeks now. They are hurting some from being swollen. She is having swelling to her thighs both legs. Recent left knee surgery but this side is not more swollen. No drainage or ulcers. Feels that switch from lasix to torsemide has not helped. She is urinating a lot but not getting rid of any fluid. She denies fevers or chills. No ulcers on her legs. Denies missing torsemide recently.   Review of Systems  Constitutional: Negative.   Respiratory: Negative for cough, chest tightness and shortness of breath.   Cardiovascular: Positive for leg swelling. Negative for chest pain and palpitations.  Gastrointestinal: Negative for abdominal distention, abdominal pain, constipation, diarrhea, nausea and vomiting.  Musculoskeletal: Positive for arthralgias and gait problem.  Skin: Negative.   Psychiatric/Behavioral: Negative.       Objective:   Physical Exam  Constitutional: She is oriented to person, place, and time. She appears well-developed and well-nourished.  HENT:  Head: Normocephalic and atraumatic.  Eyes: EOM are normal.  Neck: Normal range of motion.  Cardiovascular: Normal rate and regular rhythm.  Pulmonary/Chest: Effort normal and breath sounds normal. No respiratory distress. She has no wheezes. She has no rales.  Abdominal: Soft. Bowel sounds are normal. She exhibits no distension. There is no tenderness. There is no rebound.  Musculoskeletal: She exhibits edema and tenderness.  2-3+ edema to midthigh bilaterally. No ulcers or cellulitis on either leg  Neurological: She is alert and oriented to person, place, and time. Coordination abnormal.  Wheelchair long distances, walking more at home  Skin: Skin is warm and dry.   Vitals:   02/15/18 1131  BP: 122/82    Pulse: 60  Temp: 98.2 F (36.8 C)  TempSrc: Oral  SpO2: 94%  Weight: 260 lb (117.9 kg)  Height: 5\' 5"  (1.651 m)      Assessment & Plan:

## 2018-02-15 NOTE — Progress Notes (Signed)
Medical treatment/procedure(s) were performed by non-physician practitioner and as supervising physician I was immediately available for consultation/collaboration. I agree with above. Elizabeth A Crawford, MD  

## 2018-02-15 NOTE — Patient Instructions (Addendum)
Pre visit review using our clinic review tool, if applicable. No additional management support is needed unless otherwise documented below in the visit note.  Take 1 tablet today, 1 1/2 tablets tomorrow, 1 tablet Thursday.  On Friday continue to take 2.5 mg daily except 5 mg on Mon/Wed/ Fri.  Re-check in 1 week.

## 2018-02-16 ENCOUNTER — Ambulatory Visit: Payer: Medicare Other | Admitting: Cardiovascular Disease

## 2018-02-16 ENCOUNTER — Telehealth: Payer: Self-pay | Admitting: Cardiovascular Disease

## 2018-02-16 NOTE — Telephone Encounter (Signed)
No show today for appointment with me.   Robin Snow

## 2018-02-16 NOTE — Progress Notes (Deleted)
No chief complaint on file.  History of Present Illness: 77 yo female with history of DM, HTN, hyperlipidemia, persistent atrial fibrillation, obesity, post termination pauses s/p PPM, stroke, chronic diastolic CHF, chronic venous insufficiency, sleep apnea and COPD here today for cardiac follow up. She was seen in my office back in 2012 after being admitted to [facility] 05/02/10 with chest pain.  She ruled out for an MI with serial cardiac enzymes. Her echo showed moderate LVH and normal LV systolic function. Stress myoview without ischemia. CT angiogram without evidence of PE. Prior cardiac cath 2001 with no evidence of CAD. Admitted to [facility] August 2015 with a TIA and was started on Eliquis. She has since been changed to coumadin due to cost.  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 Echo January 2018 with LVEF=70%, moderate MR. She was seen in our office April 2018 with c/o dizziness and worsened LE edema. Metolazone was added twice weekly. 48 hour cardiac monitor showed post termination pauses. She was seen in the EP clinic by Dr. Graciela Husbands. Metoprolol was reduced but she continued to have dizziness with atrial fib and post termination pauses. A permanent pacemaker was placed in June 2018. Nuclear stress test in November 2018 with no ischemia. Her sleep apnea is followed by Dr. Mayford Knife. She is on CPAP. She has been diagnosed with autoimmune hemolytic anemia recently and is on prednisone.   *** is here today for follow up. The patient denies any chest pain, dyspnea, palpitations, lower extremity edema, orthopnea, PND, dizziness, near syncope or syncope.     Primary Care Physician: [name], MD   Past Medical History:  Diagnosis Date  . AKI (acute kidney injury) (HCC) 04/2016  . Anemia   . Arthritis   . Chronic atrial fibrillation   . Chronic edema   . Chronic venous insufficiency   . CKD (chronic kidney disease), stage III (HCC)   . Coagulopathy (HCC)   . Diabetes  mellitus    Type 2  . Diverticulosis   . Dyslipidemia   . GERD (gastroesophageal reflux disease)   . GI bleed 2015   a. lower GI from diverticular source 2015.  [name] Hx of transfusion of packed red blood cells   . Hypertension   . Morbid obesity (HCC)   . Obstructive sleep apnea    on CPAP  . Pulmonary hypertension (HCC)   . Pyelonephritis 04/2016  . Renal infarct (HCC)    a. 04/2016- adm with flank pain, ? pyelo vs renal infarct in setting of recent subtherapeutic INR.  [name] Sinus bradycardia   . Stroke [facility]) 2015    Past Surgical History:  Procedure Laterality Date  . CESAREAN SECTION    . COLONOSCOPY Left 01/24/2013   Procedure: COLONOSCOPY;  Surgeon: Willis Modena, MD;  Location: [facility] ENDOSCOPY;  Service: Endoscopy;  Laterality: Left;  . ESOPHAGOGASTRODUODENOSCOPY Left 01/23/2013   Procedure: ESOPHAGOGASTRODUODENOSCOPY (EGD);  Surgeon: Willis Modena, MD;  Location: [facility] ENDOSCOPY;  Service: Endoscopy;  Laterality: Left;  . EYE SURGERY Bilateral    cataract surgery  . JOINT REPLACEMENT    . PACEMAKER IMPLANT [name] 09/09/2016   Procedure: Pacemaker Implant;  Surgeon: Marinus Maw, MD;  Location: [facility] INVASIVE CV LAB;  Service: Cardiovascular;  Laterality: [name];  . REPLACEMENT TOTAL KNEE BILATERAL    . TUBAL LIGATION      Current Outpatient Medications  Medication Sig Dispense Refill  . acetaminophen (TYLENOL) 500 MG tablet Take by mouth.    Nancie Neas XT  180 MG 24 hr capsule TAKE 1 CAPSULE BY MOUTH ONCE DAILY 90 capsule 0  . ferrous sulfate 325 (65 FE) MG EC tablet TAKE ONE TABLET BY MOUTH TWICE DAILY 60 tablet 6  . furosemide (LASIX) 40 MG tablet Take 1 tablet (40 mg total) by mouth 2 (two) times daily. 180 tablet 3  . gabapentin (NEURONTIN) 300 MG capsule TAKE 1 CAPSULE BY MOUTH THREE TIMES DAILY 270 capsule 0  . glimepiride (AMARYL) 2 MG tablet     . hydrochlorothiazide (HYDRODIURIL) 12.5 MG tablet     . HYDROmorphone (DILAUDID) 2 MG tablet     . lisinopril (PRINIVIL,ZESTRIL) 20  MG tablet     . lovastatin (MEVACOR) 20 MG tablet TAKE 1 TABLET BY MOUTH AT BEDTIME 30 tablet 0  . meclizine (ANTIVERT) 25 MG tablet Take 1 tablet (25 mg total) by mouth every 4 (four) hours as needed for dizziness. 60 tablet 0  . metoprolol tartrate (LOPRESSOR) 50 MG tablet Take 1 tablet (50 mg total) by mouth 2 (two) times daily. 180 tablet 3  . potassium chloride SA (K-DUR,KLOR-CON) 20 MEQ tablet Take 1 tablet (20 mEq total) by mouth as directed. Take 2 tabs (40 meq) in the AM and 1 tab (20 meq) in the PM 135 tablet 3  . predniSONE (DELTASONE) 20 MG tablet Take 1 tablet (20 mg total) by mouth daily with breakfast. 30 tablet 0  . Respiratory Therapy Supplies MISC by Does not apply route.    . senna-docusate (SENOKOT-S) 8.6-50 MG tablet Take by mouth.    . warfarin (COUMADIN) 5 MG tablet Take 1 tablet Mon/Wed/Fridays and 1/2 tablet all other days OR AS DIRECTED - 90 day 90 tablet 1   No current facility-administered medications for this visit.     Allergies  Allergen Reactions  . Aspirin Hives  . Rofecoxib Hives  . Hydrocodone Hives    Tolerates oxycodone and tramadol    Social History   Socioeconomic History  . Marital status: Widowed    Spouse name: Not on file  . Number of children: 4  . Years of education: 57  . Highest education level: Not on file  Occupational History  . Not on file  Social Needs  . Financial resource strain: Not on file  . Food insecurity:    Worry: Not on file    Inability: Not on file  . Transportation needs:    Medical: Not on file    Non-medical: Not on file  Tobacco Use  . Smoking status: Former Smoker    Packs/day: 0.12    Years: 34.00    Pack years: 4.08    Types: Cigarettes    Last attempt to quit: 07/07/1993    Years since quitting: 24.6  . Smokeless tobacco: Never Used  Substance and Sexual Activity  . Alcohol use: No    Comment: no longer drinks alcohol  . Drug use: No  . Sexual activity: Never  Lifestyle  . Physical activity:     Days per week: Not on file    Minutes per session: Not on file  . Stress: Not on file  Relationships  . Social connections:    Talks on phone: Not on file    Gets together: Not on file    Attends religious service: Not on file    Active member of club or organization: Not on file    Attends meetings of clubs or organizations: Not on file    Relationship status: Not on file  .  Intimate partner violence:    Fear of current or ex partner: Not on file    Emotionally abused: Not on file    Physically abused: Not on file    Forced sexual activity: Not on file  Other Topics Concern  . Not on file  Social History Narrative   Patient is widowed and has 4 living children, and 2 deceased children.   Patient is right handed.   Patient has hs education.   Patient drinks coffee and soda once a week.    Family History  Problem Relation Age of Onset  . Cancer Father   . Stroke Maternal Aunt     Review of Systems:  As stated in the HPI and otherwise negative.   There were no vitals taken for this visit.  Physical Examination: General: Well developed, well nourished, NAD  HEENT: OP clear, mucus membranes moist  SKIN: warm, dry. No rashes. Neuro: No focal deficits  Musculoskeletal: Muscle strength 5/5 all ext  Psychiatric: Mood and affect normal  Neck: No JVD, no carotid bruits, no thyromegaly, no lymphadenopathy.  Lungs:Clear bilaterally, no wheezes, rhonci, crackles Cardiovascular: Regular rate and rhythm. No murmurs, gallops or rubs. Abdomen:Soft. Bowel sounds present. Non-tender.  Extremities: No lower extremity edema. Pulses are 2 + in the bilateral DP/PT.  Echo January 2018: Left ventricle: The cavity size was normal. Systolic function was   vigorous. The estimated ejection fraction was in the range of 65%   to 70%. Wall motion was normal; there were no regional wall   motion abnormalities. - Mitral valve: There was moderate regurgitation. - Left atrium: The atrium was  severely dilated. - Right atrium: The atrium was moderately to severely dilated. - Pulmonary arteries: Systolic pressure was moderately increased.   PA peak pressure: 52 mm Hg (S).  EKG:  EKG is *** ordered today. The ekg ordered today demonstrates    Recent Labs: 02/22/2017: B Natriuretic Peptide 168.8 02/03/2018: ALT 8; BUN 31; Creatinine 1.48; Hemoglobin 10.6; Platelet Count 152; Potassium 4.0; Sodium 147   Lipid Panel    Component Value Date/Time   CHOL 146 02/22/2017 0340   TRIG 120 02/22/2017 0340   HDL 28 (L) 02/22/2017 0340   CHOLHDL 5.2 02/22/2017 0340   VLDL 24 02/22/2017 0340   LDLCALC 94 02/22/2017 0340     Wt Readings from Last 3 Encounters:  02/15/18 260 lb (117.9 kg)  02/03/18 254 lb 9.6 oz (115.5 kg)  01/20/18 249 lb 12.8 oz (113.3 kg)     Other studies Reviewed: Additional studies/ records that were reviewed today include: . Review of the above records demonstrates:   Assessment and Plan:   1. Atrial fibrillation, persistent:  Rate controlled. Will continue Cardizem and metoprolol. Continue coumadin.    2. HTN: BP is well controlled.   3. Tachy/brady syndrome: PPM in place and followed in EP  4. Sleep apnea: She is on CPAP  5. HLD: Continue statin. Lipids followed in primary care  6. Chronic diastolic CHF: Weight is stable Volume status is ok. Continue daily Lasix.   7. Mitral regurgitation: Moderate by echo January 2018. *** Will repeat echo now.   8. Chronic venous insufficiency: ***    Current medicines are reviewed at length with the patient today.  The patient does not have concerns regarding medicines.  The following changes have been made:  Lisinopril increased.   Labs/ tests ordered today include:   No orders of the defined types were placed in this encounter.  Disposition:   FU with me in 12  months  Signed, Verne Carrow, MD 02/16/2018 5:39 AM    [facility] [address] [place], [place], [place]   [address] Phone: [phone]; Fax: [phone]

## 2018-02-21 ENCOUNTER — Telehealth: Payer: Self-pay

## 2018-02-21 NOTE — Telephone Encounter (Signed)
Spoke with pt and reminded pt of remote transmission that is due today. Pt verbalized understanding.   

## 2018-02-22 ENCOUNTER — Telehealth: Payer: Self-pay | Admitting: Oncology

## 2018-02-22 ENCOUNTER — Encounter: Payer: Self-pay | Admitting: Cardiovascular Disease

## 2018-02-22 ENCOUNTER — Inpatient Hospital Stay (HOSPITAL_BASED_OUTPATIENT_CLINIC_OR_DEPARTMENT_OTHER): Payer: Medicare Other | Admitting: Oncology

## 2018-02-22 ENCOUNTER — Encounter: Payer: Self-pay | Admitting: Oncology

## 2018-02-22 ENCOUNTER — Ambulatory Visit (INDEPENDENT_AMBULATORY_CARE_PROVIDER_SITE_OTHER): Payer: Medicare Other | Admitting: General Practice

## 2018-02-22 ENCOUNTER — Inpatient Hospital Stay: Payer: Medicare Other | Attending: Oncology

## 2018-02-22 VITALS — BP 136/87 | HR 101 | Temp 98.3°F | Resp 18 | Ht 65.0 in | Wt 259.7 lb

## 2018-02-22 DIAGNOSIS — D5911 Warm autoimmune hemolytic anemia: Secondary | ICD-10-CM

## 2018-02-22 DIAGNOSIS — D591 Other autoimmune hemolytic anemias: Secondary | ICD-10-CM | POA: Insufficient documentation

## 2018-02-22 DIAGNOSIS — I4821 Permanent atrial fibrillation: Secondary | ICD-10-CM

## 2018-02-22 DIAGNOSIS — E119 Type 2 diabetes mellitus without complications: Secondary | ICD-10-CM | POA: Insufficient documentation

## 2018-02-22 DIAGNOSIS — Z7901 Long term (current) use of anticoagulants: Secondary | ICD-10-CM | POA: Diagnosis not present

## 2018-02-22 DIAGNOSIS — I10 Essential (primary) hypertension: Secondary | ICD-10-CM

## 2018-02-22 DIAGNOSIS — Z7952 Long term (current) use of systemic steroids: Secondary | ICD-10-CM | POA: Diagnosis not present

## 2018-02-22 LAB — CBC WITH DIFFERENTIAL (CANCER CENTER ONLY)
ABS IMMATURE GRANULOCYTES: 0.07 10*3/uL (ref 0.00–0.07)
BASOS ABS: 0 10*3/uL (ref 0.0–0.1)
Basophils Relative: 0 %
EOS PCT: 1 %
Eosinophils Absolute: 0.1 10*3/uL (ref 0.0–0.5)
HCT: 36.3 % (ref 36.0–46.0)
HEMOGLOBIN: 11.3 g/dL — AB (ref 12.0–15.0)
Immature Granulocytes: 1 %
LYMPHS PCT: 53 %
Lymphs Abs: 3.7 10*3/uL (ref 0.7–4.0)
MCH: 29.4 pg (ref 26.0–34.0)
MCHC: 31.1 g/dL (ref 30.0–36.0)
MCV: 94.5 fL (ref 80.0–100.0)
Monocytes Absolute: 0.7 10*3/uL (ref 0.1–1.0)
Monocytes Relative: 10 %
NEUTROS ABS: 2.5 10*3/uL (ref 1.7–7.7)
NRBC: 0 % (ref 0.0–0.2)
Neutrophils Relative %: 35 %
Platelet Count: 158 10*3/uL (ref 150–400)
RBC: 3.84 MIL/uL — AB (ref 3.87–5.11)
RDW: 15 % (ref 11.5–15.5)
WBC: 7.1 10*3/uL (ref 4.0–10.5)

## 2018-02-22 LAB — CMP (CANCER CENTER ONLY)
ALT: 7 U/L (ref 0–44)
ANION GAP: 11 (ref 5–15)
AST: 9 U/L — ABNORMAL LOW (ref 15–41)
Albumin: 3.2 g/dL — ABNORMAL LOW (ref 3.5–5.0)
Alkaline Phosphatase: 77 U/L (ref 38–126)
BUN: 18 mg/dL (ref 8–23)
CHLORIDE: 105 mmol/L (ref 98–111)
CO2: 29 mmol/L (ref 22–32)
Calcium: 9.1 mg/dL (ref 8.9–10.3)
Creatinine: 1.46 mg/dL — ABNORMAL HIGH (ref 0.44–1.00)
GFR, EST AFRICAN AMERICAN: 39 mL/min — AB (ref >60)
GFR, EST NON AFRICAN AMERICAN: 33 mL/min — AB (ref >60)
Glucose, Bld: 93 mg/dL (ref 70–99)
POTASSIUM: 3.9 mmol/L (ref 3.5–5.1)
Sodium: 145 mmol/L (ref 135–145)
Total Bilirubin: 0.9 mg/dL (ref 0.3–1.2)
Total Protein: 6.8 g/dL (ref 6.5–8.1)

## 2018-02-22 LAB — LACTATE DEHYDROGENASE: LDH: 213 U/L — AB (ref 98–192)

## 2018-02-22 LAB — POCT INR: INR: 1.2 — AB (ref 2.0–3.0)

## 2018-02-22 NOTE — Patient Instructions (Addendum)
Pre visit review using our clinic review tool, if applicable. No additional management support is needed unless otherwise documented below in the visit note.  Take 1 tablet today (11/19), take 1 1/2 tablets tomorrow (11/20), and take 1 tablet on Thursday (11/21).  On Friday start taking 1 tablet daily except 1/2 tablet on Tuesdays and Saturdays.  Reiterated the importance of taking medication on a daily basis without missing doses and the importance of using a pill box.  Patient and daughter verbalize understanding.

## 2018-02-22 NOTE — Progress Notes (Signed)
[facility] OFFICE PROGRESS NOTE  [name], MD [address] [place] [address]  DIAGNOSIS:Autoimmune hemolytic anemia.  PRIOR THERAPY:None  CURRENT THERAPY:Prednisone taper.Current dose is 10 mg daily.  INTERVAL HISTORY: Robin Snow 77 y.o. female returns for routine follow-up visit accompanied by her daughter.  The patient is feeling fine today and has no specific complaints.  She is tolerating her prednisone fairly well.  Denies fevers and chills.  Denies chest pain, shortness of breath, cough, hemoptysis.  Denies nausea, vomiting, constipation, diarrhea.  Denies recent weight loss or night sweats.  Reports ongoing lower extremity edema which is unchanged.  The patient is here for evaluation and repeat lab work.  MEDICAL HISTORY: Past Medical History:  Diagnosis Date  . AKI (acute kidney injury) (HCC) 04/2016  . Anemia   . Arthritis   . Chronic atrial fibrillation   . Chronic edema   . Chronic venous insufficiency   . CKD (chronic kidney disease), stage III (HCC)   . Coagulopathy (HCC)   . Diabetes mellitus    Type 2  . Diverticulosis   . Dyslipidemia   . GERD (gastroesophageal reflux disease)   . GI bleed 2015   a. lower GI from diverticular source 2015.  [name] Hx of transfusion of packed red blood cells   . Hypertension   . Morbid obesity (HCC)   . Obstructive sleep apnea    on CPAP  . Pulmonary hypertension (HCC)   . Pyelonephritis 04/2016  . Renal infarct (HCC)    a. 04/2016- adm with flank pain, ? pyelo vs renal infarct in setting of recent subtherapeutic INR.  [name] Sinus bradycardia   . Stroke [facility]) 2015    ALLERGIES:  is allergic to aspirin; rofecoxib; and hydrocodone.  MEDICATIONS:  Current Outpatient Medications  Medication Sig Dispense Refill  . acetaminophen (TYLENOL) 500 MG tablet Take by mouth.    . CARTIA XT 180 MG 24 hr capsule TAKE 1 CAPSULE BY MOUTH ONCE DAILY 90 capsule 0  . ferrous sulfate 325 (65 FE) MG  EC tablet TAKE ONE TABLET BY MOUTH TWICE DAILY 60 tablet 6  . furosemide (LASIX) 40 MG tablet Take 1 tablet (40 mg total) by mouth 2 (two) times daily. 180 tablet 3  . gabapentin (NEURONTIN) 300 MG capsule TAKE 1 CAPSULE BY MOUTH THREE TIMES DAILY 270 capsule 0  . glimepiride (AMARYL) 2 MG tablet     . hydrochlorothiazide (HYDRODIURIL) 12.5 MG tablet     . HYDROmorphone (DILAUDID) 2 MG tablet     . lisinopril (PRINIVIL,ZESTRIL) 20 MG tablet     . lovastatin (MEVACOR) 20 MG tablet TAKE 1 TABLET BY MOUTH AT BEDTIME 30 tablet 0  . meclizine (ANTIVERT) 25 MG tablet Take 1 tablet (25 mg total) by mouth every 4 (four) hours as needed for dizziness. 60 tablet 0  . potassium chloride SA (K-DUR,KLOR-CON) 20 MEQ tablet Take 1 tablet (20 mEq total) by mouth as directed. Take 2 tabs (40 meq) in the AM and 1 tab (20 meq) in the PM 135 tablet 3  . predniSONE (DELTASONE) 20 MG tablet Take 1 tablet (20 mg total) by mouth daily with breakfast. 30 tablet 0  . Respiratory Therapy Supplies MISC by Does not apply route.    . senna-docusate (SENOKOT-S) 8.6-50 MG tablet Take by mouth.    . warfarin (COUMADIN) 5 MG tablet Take 1 tablet Mon/Wed/Fridays and 1/2 tablet all other days OR AS DIRECTED - 90 day 90 tablet 1  .  metoprolol tartrate (LOPRESSOR) 50 MG tablet Take 1 tablet (50 mg total) by mouth 2 (two) times daily. 180 tablet 3   No current facility-administered medications for this visit.     SURGICAL HISTORY:  Past Surgical History:  Procedure Laterality Date  . CESAREAN SECTION    . COLONOSCOPY Left 01/24/2013   Procedure: COLONOSCOPY;  Surgeon: Willis Modena, MD;  Location: [facility] ENDOSCOPY;  Service: Endoscopy;  Laterality: Left;  . ESOPHAGOGASTRODUODENOSCOPY Left 01/23/2013   Procedure: ESOPHAGOGASTRODUODENOSCOPY (EGD);  Surgeon: Willis Modena, MD;  Location: [facility] ENDOSCOPY;  Service: Endoscopy;  Laterality: Left;  . EYE SURGERY Bilateral    cataract surgery  . JOINT REPLACEMENT    . PACEMAKER IMPLANT  [name] 09/09/2016   Procedure: Pacemaker Implant;  Surgeon: Marinus Maw, MD;  Location: [facility] INVASIVE CV LAB;  Service: Cardiovascular;  Laterality: [name];  . REPLACEMENT TOTAL KNEE BILATERAL    . TUBAL LIGATION      REVIEW OF SYSTEMS:   Review of Systems  Constitutional: Negative for appetite change, chills, fatigue, fever and unexpected weight change.  HENT:   Negative for mouth sores, nosebleeds, sore throat and trouble swallowing.   Eyes: Negative for eye problems and icterus.  Respiratory: Negative for cough, hemoptysis, shortness of breath and wheezing.   Cardiovascular: Negative for chest pain.  Positive for lower extremity edema which is consistent with her baseline. Gastrointestinal: Negative for abdominal pain, constipation, diarrhea, nausea and vomiting.  Genitourinary: Negative for bladder incontinence, difficulty urinating, dysuria, frequency and hematuria.   Musculoskeletal: Negative for back pain, gait problem, neck pain and neck stiffness.  Skin: Negative for itching and rash.  Neurological: Negative for dizziness, extremity weakness, gait problem, headaches, light-headedness and seizures.  Hematological: Negative for adenopathy. Does not bruise/bleed easily.  Psychiatric/Behavioral: Negative for confusion, depression and sleep disturbance. The patient is not nervous/anxious.     PHYSICAL EXAMINATION:  Blood pressure 136/87, pulse (!) 101, temperature 98.3 F (36.8 C), temperature source Oral, resp. rate 18, height 5\' 5"  (1.651 m), weight 259 lb 11.2 oz (117.8 kg), SpO2 99 %.  ECOG PERFORMANCE STATUS: 1 - Symptomatic but completely ambulatory  Physical Exam  Constitutional: Oriented to person, place, and time and well-developed, well-nourished, and in no distress. No distress.  HENT:  Head: Normocephalic and atraumatic.  Mouth/Throat: Oropharynx is clear and moist. No oropharyngeal exudate.  Eyes: Conjunctivae are normal. Right eye exhibits no discharge. Left eye exhibits  no discharge. No scleral icterus.  Neck: Normal range of motion. Neck supple.  Cardiovascular: Normal rate, regular rhythm, normal heart sounds and intact distal pulses.  1+ lower extremity edema. Pulmonary/Chest: Effort normal and breath sounds normal. No respiratory distress. No wheezes. No rales.  Abdominal: Soft. Bowel sounds are normal. Exhibits no distension and no mass. There is no tenderness.  Musculoskeletal: Normal range of motion.  Lymphadenopathy:    No cervical adenopathy.  Neurological: Alert and oriented to person, place, and time. Exhibits normal muscle tone. Gait normal. Coordination normal.  Skin: Skin is warm and dry. No rash noted. Not diaphoretic. No erythema. No pallor.  Psychiatric: Mood, memory and judgment normal.  Vitals reviewed.  LABORATORY DATA: Lab Results  Component Value Date   WBC 7.1 02/22/2018   HGB 11.3 (L) 02/22/2018   HCT 36.3 02/22/2018   MCV 94.5 02/22/2018   PLT 158 02/22/2018      Chemistry      Component Value Date/Time   NA 145 02/22/2018 0915   NA 139 08/19/2016 1010   K 3.9  02/22/2018 0915   CL 105 02/22/2018 0915   CO2 29 02/22/2018 0915   BUN 18 02/22/2018 0915   BUN 25 08/19/2016 1010   CREATININE 1.46 (H) 02/22/2018 0915      Component Value Date/Time   CALCIUM 9.1 02/22/2018 0915   ALKPHOS 77 02/22/2018 0915   AST 9 (L) 02/22/2018 0915   ALT 7 02/22/2018 0915   BILITOT 0.9 02/22/2018 0915       RADIOGRAPHIC STUDIES:  No results found.   ASSESSMENT/PLAN:  Warm antibody hemolytic anemia (HCC) This is a very pleasant 77 year old African-American female recently diagnosed with autoimmune hemolytic anemia. The patient is currently on treatment with prednisone10 mg p.o. daily. CBC from today was reviewed with Dr. Arbutus PedMohamed.   Hemoglobin is up slightly.  CMET, LDH, and haptoglobin levels are pending.  We will await these results.  For now she will stay on 10 mg a day but if her haptoglobin and LDH are adequate, will  reduce the prednisone dose to 5 mg/day.  We will contact the patient and her daughter soon as we have the results with further instructions.  She will follow-up in 2 weeks for evaluation and repeat lab work.  The patient was advised to call immediately if she has any concerning symptoms in the interval. The patient voices understanding of current disease status and treatment options and is in agreement with the current care plan.  All questions were answered. The patient knows to call the clinic with any problems, questions or concerns. We can certainly see the patient much sooner if necessary.   Orders Placed This Encounter  Procedures  . CBC with Differential (Cancer Center Only)    Standing Status:   Future    Standing Expiration Date:   02/24/2019  . CMP (Cancer Center only)    Standing Status:   Future    Standing Expiration Date:   02/24/2019  . Lactate dehydrogenase    Standing Status:   Future    Standing Expiration Date:   02/24/2019  . Haptoglobin    Standing Status:   Future    Standing Expiration Date:   02/23/2019     Clenton PareKristin Maela Takeda, DNP, AGPCNP-BC, AOCNP 02/22/18

## 2018-02-22 NOTE — Telephone Encounter (Signed)
Printed calendar and avs. °

## 2018-02-22 NOTE — Progress Notes (Signed)
Medical treatment/procedure(s) were performed by non-physician practitioner and as supervising physician I was immediately available for consultation/collaboration. I agree with above. Kenzlei Runions A Conard Alvira, MD  

## 2018-02-23 ENCOUNTER — Other Ambulatory Visit: Payer: Self-pay | Admitting: Oncology

## 2018-02-23 ENCOUNTER — Telehealth: Payer: Self-pay

## 2018-02-23 LAB — HAPTOGLOBIN: Haptoglobin: 179 mg/dL (ref 34–200)

## 2018-02-23 MED ORDER — PREDNISONE 5 MG PO TABS: 5.0000 mg | ORAL_TABLET | Freq: Every day | ORAL | 0 refills | Status: DC

## 2018-02-23 NOTE — Telephone Encounter (Signed)
Contacted patient per request by Clenton PareKristin Curcio NP and made aware to reduce the prednisone to 5 mg daily and that a new prescription has been received by her pharmacy. Also reviewed her next appointments. Patient wrote all information down and had no other questions or concerns.

## 2018-02-23 NOTE — Assessment & Plan Note (Signed)
This is a very pleasant 77 year old African-American female recently diagnosed with autoimmune hemolytic anemia. The patient is currently on treatment with prednisone10 mg p.o. daily. CBC from today was reviewed with Dr. Arbutus PedMohamed.   Hemoglobin is up slightly.  CMET, LDH, and haptoglobin levels are pending.  We will await these results.  For now she will stay on 10 mg a day but if her haptoglobin and LDH are adequate, will reduce the prednisone dose to 5 mg/day.  We will contact the patient and her daughter soon as we have the results with further instructions.  She will follow-up in 2 weeks for evaluation and repeat lab work.  The patient was advised to call immediately if she has any concerning symptoms in the interval. The patient voices understanding of current disease status and treatment options and is in agreement with the current care plan.  All questions were answered. The patient knows to call the clinic with any problems, questions or concerns. We can certainly see the patient much sooner if necessary.

## 2018-02-24 ENCOUNTER — Other Ambulatory Visit: Payer: Self-pay | Admitting: Internal Medicine

## 2018-02-24 ENCOUNTER — Encounter: Payer: Self-pay | Admitting: Cardiology

## 2018-03-02 ENCOUNTER — Encounter: Payer: Self-pay | Admitting: Cardiovascular Disease

## 2018-03-02 ENCOUNTER — Ambulatory Visit: Payer: Medicare Other | Admitting: Cardiovascular Disease

## 2018-03-02 VITALS — BP 124/70 | HR 61 | Ht 65.0 in | Wt 260.0 lb

## 2018-03-02 DIAGNOSIS — I4821 Permanent atrial fibrillation: Secondary | ICD-10-CM

## 2018-03-02 DIAGNOSIS — I340 Nonrheumatic mitral (valve) insufficiency: Secondary | ICD-10-CM | POA: Diagnosis not present

## 2018-03-02 DIAGNOSIS — I495 Sick sinus syndrome: Secondary | ICD-10-CM

## 2018-03-02 DIAGNOSIS — I872 Venous insufficiency (chronic) (peripheral): Secondary | ICD-10-CM

## 2018-03-02 DIAGNOSIS — I10 Essential (primary) hypertension: Secondary | ICD-10-CM | POA: Diagnosis not present

## 2018-03-02 NOTE — Patient Instructions (Signed)
Medication Instructions:  Your physician recommends that you continue on your current medications as directed. Please refer to the Current Medication list given to you today.  If you need a refill on your cardiac medications before your next appointment, please call your pharmacy.   Lab work: none If you have labs (blood work) drawn today and your tests are completely normal, you will receive your results only by: . MyChart Message (if you have MyChart) OR . A paper copy in the mail If you have any lab test that is abnormal or we need to change your treatment, we will call you to review the results.  Testing/Procedures: Your physician has requested that you have an echocardiogram. Echocardiography is a painless test that uses sound waves to create images of your heart. It provides your doctor with information about the size and shape of your heart and how well your heart's chambers and valves are working. This procedure takes approximately one hour. There are no restrictions for this procedure.  To be done in January 2020  Follow-Up: At [facility], you and your health needs are our priority.  As part of our continuing mission to provide you with exceptional heart care, we have created designated Provider Care Teams.  These Care Teams include your primary Cardiologist (physician) and Advanced Practice Providers (APPs -  Physician Assistants and Nurse Practitioners) who all work together to provide you with the care you need, when you need it. You will need a follow up appointment in 12 months.  Please call our office 2 months in advance to schedule this appointment.  You may see Christopher McAlhany, MD or one of the following Advanced Practice Providers on your designated Care Team:   Brittainy Simmons, PA-C Dayna Dunn, PA-C . Michele Lenze, PA-C  Any Other Special Instructions Will Be Listed Below (If Applicable).    

## 2018-03-02 NOTE — Progress Notes (Signed)
Chief Complaint  Patient presents with  . Follow-up    atrial fibrillation   History of Present Illness: 77 yo female with history of DM, HTN, hyperlipidemia, permanent atrial fibrillation with post-termination pauses s/p permanent pacemaker placement, mitral regurgitation, prior stroke, chronic lower extremity edema secondary to venous insufficiency, obesity, sleep apnea and COPD here today for cardiac follow up. She was seen in my office back in 2012 after being admitted to [facility] in January 2012  with chest pain. At that time her echo showed moderate LVH with normal LV systolic function. Stress myoview without ischemia. CT angiogram without evidence of PE. Cardiac cath 2001 with no evidence of CAD. [facility] with a TIA and was started on Eliquis. Of note, her coumadin had been stopped in 2015 due to GI bleeding. She was seen in our office November 2015 and doing well on Eliquis with rate control with Diltiazem and metoprolol. Echo August 2015 with normal LV size and function. She was changed back to coumadin in December 2017 due to cost of Eliquis and had a possible renal infarct in January 2018 due to subtherapeutic coumadin. Echo January 2018 with LVEF=65-70%, moderate MR, severe left atrial enlargement. She was found to have sinus bradycardia in the spring of 2018 associated with dizziness. She was felt to have sinus node dysfunction. Permanent pacemaker placed in June 2018. Nuclear stress test in November 2018 without ischemia.   She is here today for follow up. The patient denies any chest pain, dyspnea, palpitations, orthopnea, PND, dizziness, near syncope or syncope. She has chronic LE edema but no worse per pt.   Primary Care Physician: Myrlene Brokerrawford, Elizabeth A, MD   Past Medical History:  Diagnosis Date  . AKI (acute kidney injury) (HCC) 04/2016  . Anemia   . Arthritis   . Chronic atrial fibrillation   . Chronic edema   . Chronic venous insufficiency   . CKD  (chronic kidney disease), stage III (HCC)   . Coagulopathy (HCC)   . Diabetes mellitus    Type 2  . Diverticulosis   . Dyslipidemia   . GERD (gastroesophageal reflux disease)   . GI bleed 2015   a. lower GI from diverticular source 2015.  [name]. Hx of transfusion of packed red blood cells   . Hypertension   . Morbid obesity (HCC)   . Obstructive sleep apnea    on CPAP  . Pulmonary hypertension (HCC)   . Pyelonephritis 04/2016  . Renal infarct (HCC)    a. 04/2016- adm with flank pain, ? pyelo vs renal infarct in setting of recent subtherapeutic INR.  [name]. Sinus bradycardia   . Stroke [facility](HCC) 2015    Past Surgical History:  Procedure Laterality Date  . CESAREAN SECTION    . COLONOSCOPY Left 01/24/2013   Procedure: COLONOSCOPY;  Surgeon: Willis ModenaWilliam Outlaw, MD;  [facility];  Service: Endoscopy;  Laterality: Left;  . ESOPHAGOGASTRODUODENOSCOPY Left 01/23/2013   Procedure: ESOPHAGOGASTRODUODENOSCOPY (EGD);  Surgeon: Willis ModenaWilliam Outlaw, MD;  [facility];  Service: Endoscopy;  Laterality: Left;  . EYE SURGERY Bilateral    cataract surgery  . JOINT REPLACEMENT    . PACEMAKER IMPLANT [name] 09/09/2016   Procedure: Pacemaker Implant;  Surgeon: Marinus Mawaylor, Gregg W, MD;  [facility];  Service: Cardiovascular;  Laterality: [name];  . REPLACEMENT TOTAL KNEE BILATERAL    . TUBAL LIGATION      Current Outpatient Medications  Medication Sig Dispense Refill  . acetaminophen (TYLENOL) 500  MG tablet Take by mouth.    . CARTIA XT 180 MG 24 hr capsule TAKE 1 CAPSULE BY MOUTH ONCE DAILY 90 capsule 0  . ferrous sulfate 325 (65 FE) MG EC tablet TAKE ONE TABLET BY MOUTH TWICE DAILY 60 tablet 6  . furosemide (LASIX) 40 MG tablet Take 1 tablet (40 mg total) by mouth 2 (two) times daily. 180 tablet 3  . gabapentin (NEURONTIN) 300 MG capsule TAKE 1 CAPSULE BY MOUTH THREE TIMES DAILY 270 capsule 0  . glimepiride (AMARYL) 2 MG tablet     . hydrochlorothiazide (HYDRODIURIL) 12.5 MG tablet       . HYDROmorphone (DILAUDID) 2 MG tablet     . lisinopril (PRINIVIL,ZESTRIL) 20 MG tablet     . lovastatin (MEVACOR) 20 MG tablet TAKE 1 TABLET BY MOUTH  AT BEDTIME 90 tablet 2  . meclizine (ANTIVERT) 25 MG tablet Take 1 tablet (25 mg total) by mouth every 4 (four) hours as needed for dizziness. 60 tablet 0  . potassium chloride SA (K-DUR,KLOR-CON) 20 MEQ tablet Take 1 tablet (20 mEq total) by mouth as directed. Take 2 tabs (40 meq) in the AM and 1 tab (20 meq) in the PM 135 tablet 3  . predniSONE (DELTASONE) 5 MG tablet Take 1 tablet (5 mg total) by mouth daily with breakfast. 30 tablet 0  . Respiratory Therapy Supplies MISC by Does not apply route.    . senna-docusate (SENOKOT-S) 8.6-50 MG tablet Take by mouth.    . warfarin (COUMADIN) 5 MG tablet Take 1 tablet Mon/Wed/Fridays and 1/2 tablet all other days OR AS DIRECTED - 90 day 90 tablet 1  . metoprolol tartrate (LOPRESSOR) 50 MG tablet Take 1 tablet (50 mg total) by mouth 2 (two) times daily. 180 tablet 3   No current facility-administered medications for this visit.     Allergies  Allergen Reactions  . Aspirin Hives  . Rofecoxib Hives  . Hydrocodone Hives    Tolerates oxycodone and tramadol    Social History   Socioeconomic History  . Marital status: Widowed    Spouse name: Not on file  . Number of children: 4  . Years of education: 69  . Highest education level: Not on file  Occupational History  . Not on file  Social Needs  . Financial resource strain: Not on file  . Food insecurity:    Worry: Not on file    Inability: Not on file  . Transportation needs:    Medical: Not on file    Non-medical: Not on file  Tobacco Use  . Smoking status: Former Smoker    Packs/day: 0.12    Years: 34.00    Pack years: 4.08    Types: Cigarettes    Last attempt to quit: 07/07/1993    Years since quitting: 24.6  . Smokeless tobacco: Never Used  Substance and Sexual Activity  . Alcohol use: No    Comment: no longer drinks alcohol   . Drug use: No  . Sexual activity: Never  Lifestyle  . Physical activity:    Days per week: Not on file    Minutes per session: Not on file  . Stress: Not on file  Relationships  . Social connections:    Talks on phone: Not on file    Gets together: Not on file    Attends religious service: Not on file    Active member of club or organization: Not on file    Attends meetings of clubs or  organizations: Not on file    Relationship status: Not on file  . Intimate partner violence:    Fear of current or ex partner: Not on file    Emotionally abused: Not on file    Physically abused: Not on file    Forced sexual activity: Not on file  Other Topics Concern  . Not on file  Social History Narrative   Patient is widowed and has 4 living children, and 2 deceased children.   Patient is right handed.   Patient has hs education.   Patient drinks coffee and soda once a week.    Family History  Problem Relation Age of Onset  . Cancer Father   . Stroke Maternal Aunt     Review of Systems:  As stated in the HPI and otherwise negative.   BP 124/70   Pulse 61   Ht 5\' 5"  (1.651 m)   Wt 260 lb (117.9 kg)   SpO2 98%   BMI 43.27 kg/m   Physical Examination: General: Well developed, well nourished, NAD  HEENT: OP clear, mucus membranes moist  SKIN: warm, dry. No rashes. Neuro: No focal deficits  Musculoskeletal: Muscle strength 5/5 all ext  Psychiatric: Mood and affect normal  Neck: No JVD, no carotid bruits, no thyromegaly, no lymphadenopathy.  Lungs:Clear bilaterally, no wheezes, rhonci, crackles Cardiovascular: Regular rate and rhythm. No murmurs, gallops or rubs. Abdomen:Soft. Bowel sounds present. Non-tender.  Extremities: 2+ bilateral lower extremity edema with chronic venous stasis changes. Unable to palpate distal pulses due to edema.   Echo January 2018 .Left ventricle: The cavity size was normal. Systolic function was   vigorous. The estimated ejection fraction was in  the range of 65%   to 70%. Wall motion was normal; there were no regional wall   motion abnormalities. - Mitral valve: There was moderate regurgitation. - Left atrium: The atrium was severely dilated. - Right atrium: The atrium was moderately to severely dilated. - Pulmonary arteries: Systolic pressure was moderately increased.   PA peak pressure: 52 mm Hg (S).  EKG:  EKG is  Not ordered today. The ekg ordered today demonstrates   Recent Labs: 02/22/2018: ALT 7; BUN 18; Creatinine 1.46; Hemoglobin 11.3; Platelet Count 158; Potassium 3.9; Sodium 145   Lipid Panel    Component Value Date/Time   CHOL 146 02/22/2017 0340   TRIG 120 02/22/2017 0340   HDL 28 (L) 02/22/2017 0340   CHOLHDL 5.2 02/22/2017 0340   VLDL 24 02/22/2017 0340   LDLCALC 94 02/22/2017 0340     Wt Readings from Last 3 Encounters:  03/02/18 260 lb (117.9 kg)  02/22/18 259 lb 11.2 oz (117.8 kg)  02/15/18 260 lb (117.9 kg)     Other studies Reviewed: Additional studies/ records that were reviewed today include: . Review of the above records demonstrates:   Assessment and Plan:   1. Atrial fibrillation, persistent:  She is in sinus today. Will continue coumadin, Cardizem and metoprolol. Her coumadin is followed in primary care. (of note, will need bridging for procedures given prior renal infarct when INR was subtherapeutic).    2. HTN: BP is controlled today. No changes.   3. Tachy Brady Syndrome: Pacemaker in place and followed by Dr. [name].   4.  Sleep apnea: She is on CPAP  5. Chronic venous insufficiency: Weight is stable. Her LE edema is unchanged. Continue Lasix.   6. Mitral regurgitation: Moderate by echo in January 2018. Will need to repeat echo in January 2020.  Current medicines are reviewed at length with the patient today.  The patient does not have concerns regarding medicines.  The following changes have been made:  Lisinopril increased.   Labs/ tests ordered today include:   Orders  Placed This Encounter  Procedures  . ECHOCARDIOGRAM COMPLETE    Disposition:   FU with me in 12  months  Signed, Verne Carrow, MD 03/02/2018 3:13 PM    [facility] [address] [place], [place], [place]  [address] Phone: [phone]; Fax: [phone]

## 2018-03-08 ENCOUNTER — Inpatient Hospital Stay: Payer: Medicare Other | Attending: Oncology

## 2018-03-08 ENCOUNTER — Inpatient Hospital Stay (HOSPITAL_BASED_OUTPATIENT_CLINIC_OR_DEPARTMENT_OTHER): Payer: Medicare Other | Admitting: Oncology

## 2018-03-08 ENCOUNTER — Telehealth: Payer: Self-pay | Admitting: Oncology

## 2018-03-08 ENCOUNTER — Ambulatory Visit (INDEPENDENT_AMBULATORY_CARE_PROVIDER_SITE_OTHER): Payer: Medicare Other | Admitting: General Practice

## 2018-03-08 ENCOUNTER — Encounter: Payer: Self-pay | Admitting: Oncology

## 2018-03-08 VITALS — BP 116/67 | HR 60 | Temp 98.7°F | Resp 20 | Ht 65.0 in

## 2018-03-08 DIAGNOSIS — I4821 Permanent atrial fibrillation: Secondary | ICD-10-CM

## 2018-03-08 DIAGNOSIS — Z79899 Other long term (current) drug therapy: Secondary | ICD-10-CM | POA: Insufficient documentation

## 2018-03-08 DIAGNOSIS — D5911 Warm autoimmune hemolytic anemia: Secondary | ICD-10-CM

## 2018-03-08 DIAGNOSIS — N189 Chronic kidney disease, unspecified: Secondary | ICD-10-CM | POA: Insufficient documentation

## 2018-03-08 DIAGNOSIS — D591 Other autoimmune hemolytic anemias: Secondary | ICD-10-CM

## 2018-03-08 DIAGNOSIS — Z7901 Long term (current) use of anticoagulants: Secondary | ICD-10-CM | POA: Diagnosis not present

## 2018-03-08 LAB — CMP (CANCER CENTER ONLY)
ALBUMIN: 3.4 g/dL — AB (ref 3.5–5.0)
ALT: 9 U/L (ref 0–44)
ANION GAP: 10 (ref 5–15)
AST: 10 U/L — ABNORMAL LOW (ref 15–41)
Alkaline Phosphatase: 77 U/L (ref 38–126)
BUN: 27 mg/dL — ABNORMAL HIGH (ref 8–23)
CO2: 29 mmol/L (ref 22–32)
Calcium: 8.9 mg/dL (ref 8.9–10.3)
Chloride: 104 mmol/L (ref 98–111)
Creatinine: 1.94 mg/dL — ABNORMAL HIGH (ref 0.44–1.00)
GFR, Est AFR Am: 28 mL/min — ABNORMAL LOW (ref >60)
GFR, Estimated: 24 mL/min — ABNORMAL LOW (ref >60)
GLUCOSE: 111 mg/dL — AB (ref 70–99)
POTASSIUM: 3.9 mmol/L (ref 3.5–5.1)
SODIUM: 143 mmol/L (ref 135–145)
Total Bilirubin: 0.5 mg/dL (ref 0.3–1.2)
Total Protein: 6.7 g/dL (ref 6.5–8.1)

## 2018-03-08 LAB — CBC WITH DIFFERENTIAL (CANCER CENTER ONLY)
Abs Immature Granulocytes: 0.13 10*3/uL — ABNORMAL HIGH (ref 0.00–0.07)
BASOS ABS: 0 10*3/uL (ref 0.0–0.1)
BASOS PCT: 0 %
EOS ABS: 0.1 10*3/uL (ref 0.0–0.5)
Eosinophils Relative: 1 %
HEMATOCRIT: 28.8 % — AB (ref 36.0–46.0)
Hemoglobin: 9 g/dL — ABNORMAL LOW (ref 12.0–15.0)
IMMATURE GRANULOCYTES: 2 %
LYMPHS ABS: 3.9 10*3/uL (ref 0.7–4.0)
Lymphocytes Relative: 47 %
MCH: 30 pg (ref 26.0–34.0)
MCHC: 31.3 g/dL (ref 30.0–36.0)
MCV: 96 fL (ref 80.0–100.0)
MONOS PCT: 10 %
Monocytes Absolute: 0.8 10*3/uL (ref 0.1–1.0)
NEUTROS PCT: 40 %
Neutro Abs: 3.3 10*3/uL (ref 1.7–7.7)
PLATELETS: 207 10*3/uL (ref 150–400)
RBC: 3 MIL/uL — ABNORMAL LOW (ref 3.87–5.11)
RDW: 15.5 % (ref 11.5–15.5)
WBC Count: 8.2 10*3/uL (ref 4.0–10.5)
nRBC: 0.6 % — ABNORMAL HIGH (ref 0.0–0.2)

## 2018-03-08 LAB — POCT INR: INR: 1.3 — AB (ref 2.0–3.0)

## 2018-03-08 LAB — LACTATE DEHYDROGENASE: LDH: 239 U/L — AB (ref 98–192)

## 2018-03-08 NOTE — Telephone Encounter (Signed)
No 12/3 los.   

## 2018-03-08 NOTE — Assessment & Plan Note (Signed)
This is a very pleasant 77 year old African-American female recently diagnosed with autoimmune hemolytic anemia. The patient is currently on treatment with prednisone5mg  p.o. daily. CBC from today was reviewed with Dr. Arbutus PedMohamed. Hemoglobin is down from 11.6 to 9.0.  LDH and haptoglobin are pending.  The patient has chronic kidney disease and may have a component of renal insufficiency.  Will await LDH and haptoglobin from today before making further recommendations on treatment.  She will continue the prednisone 5 mg daily for now.  May consider IVIG if she has evidence of hemolysis.  We will schedule a follow-up appointment for the patient once we have the rest of her lab work.  The patient was advised to call immediately if she has any concerning symptoms in the interval. The patient voices understanding of current disease status and treatment options and is in agreement with the current care plan.  All questions were answered. The patient knows to call the clinic with any problems, questions or concerns. We can certainly see the patient much sooner if necessary.

## 2018-03-08 NOTE — Patient Instructions (Addendum)
Pre visit review using our clinic review tool, if applicable. No additional management support is needed unless otherwise documented below in the visit note.  Take 1 tablet today, 1 1/2 tablets tomorrow and Thursday.  On Friday start taking 1 tablet daily.  Re-check in 10 to 14 days.

## 2018-03-08 NOTE — Progress Notes (Signed)
[facility] OFFICE PROGRESS NOTE  [name], MD [address] [place] [address]  DIAGNOSIS:Autoimmune hemolytic anemia.  PRIOR THERAPY:None  CURRENT THERAPY:Prednisone taper.Current dose is5 mg daily.  INTERVAL HISTORY: Robin Snow 77 y.o. female returns for a routine follow-up visit accompanied by her daughter.  The patient is feeling fine today and has no specific complaints.  She continues to tolerate treatment with prednisone fairly well.  She denies fevers and chills.  Denies chest pain, shortness of breath, cough, hemoptysis.  Denies nausea, vomiting, constipation, diarrhea.  Denies recent weight loss or night sweats.  She reports ongoing lower extremity edema which is unchanged.  The patient is here for evaluation and repeat lab work.  MEDICAL HISTORY: Past Medical History:  Diagnosis Date  . AKI (acute kidney injury) (HCC) 04/2016  . Anemia   . Arthritis   . Chronic atrial fibrillation   . Chronic edema   . Chronic venous insufficiency   . CKD (chronic kidney disease), stage III (HCC)   . Coagulopathy (HCC)   . Diabetes mellitus    Type 2  . Diverticulosis   . Dyslipidemia   . GERD (gastroesophageal reflux disease)   . GI bleed 2015   a. lower GI from diverticular source 2015.  [name] Hx of transfusion of packed red blood cells   . Hypertension   . Morbid obesity (HCC)   . Obstructive sleep apnea    on CPAP  . Pulmonary hypertension (HCC)   . Pyelonephritis 04/2016  . Renal infarct (HCC)    a. 04/2016- adm with flank pain, ? pyelo vs renal infarct in setting of recent subtherapeutic INR.  [name] Sinus bradycardia   . Stroke [facility] 2015    ALLERGIES:  is allergic to aspirin; rofecoxib; and hydrocodone.  MEDICATIONS:  Current Outpatient Medications  Medication Sig Dispense Refill  . acetaminophen (TYLENOL) 500 MG tablet Take by mouth.    . CARTIA XT 180 MG 24 hr capsule TAKE 1 CAPSULE BY MOUTH ONCE DAILY 90 capsule 0  .  ferrous sulfate 325 (65 FE) MG EC tablet TAKE ONE TABLET BY MOUTH TWICE DAILY 60 tablet 6  . furosemide (LASIX) 40 MG tablet Take 1 tablet (40 mg total) by mouth 2 (two) times daily. 180 tablet 3  . gabapentin (NEURONTIN) 300 MG capsule TAKE 1 CAPSULE BY MOUTH THREE TIMES DAILY 270 capsule 0  . hydrochlorothiazide (HYDRODIURIL) 12.5 MG tablet     . HYDROmorphone (DILAUDID) 2 MG tablet     . lisinopril (PRINIVIL,ZESTRIL) 20 MG tablet     . lovastatin (MEVACOR) 20 MG tablet TAKE 1 TABLET BY MOUTH  AT BEDTIME 90 tablet 2  . meclizine (ANTIVERT) 25 MG tablet Take 1 tablet (25 mg total) by mouth every 4 (four) hours as needed for dizziness. 60 tablet 0  . metoprolol tartrate (LOPRESSOR) 50 MG tablet Take 1 tablet (50 mg total) by mouth 2 (two) times daily. 180 tablet 3  . potassium chloride SA (K-DUR,KLOR-CON) 20 MEQ tablet Take 1 tablet (20 mEq total) by mouth as directed. Take 2 tabs (40 meq) in the AM and 1 tab (20 meq) in the PM 135 tablet 3  . predniSONE (DELTASONE) 5 MG tablet Take 1 tablet (5 mg total) by mouth daily with breakfast. 30 tablet 0  . Respiratory Therapy Supplies MISC by Does not apply route.    . senna-docusate (SENOKOT-S) 8.6-50 MG tablet Take by mouth.    . warfarin (COUMADIN) 5 MG tablet Take  1 tablet Mon/Wed/Fridays and 1/2 tablet all other days OR AS DIRECTED - 90 day 90 tablet 1   No current facility-administered medications for this visit.     SURGICAL HISTORY:  Past Surgical History:  Procedure Laterality Date  . CESAREAN SECTION    . COLONOSCOPY Left 01/24/2013   Procedure: COLONOSCOPY;  Surgeon: Willis Modena, MD;  Location: [facility] ENDOSCOPY;  Service: Endoscopy;  Laterality: Left;  . ESOPHAGOGASTRODUODENOSCOPY Left 01/23/2013   Procedure: ESOPHAGOGASTRODUODENOSCOPY (EGD);  Surgeon: Willis Modena, MD;  Location: [facility] ENDOSCOPY;  Service: Endoscopy;  Laterality: Left;  . EYE SURGERY Bilateral    cataract surgery  . JOINT REPLACEMENT    . PACEMAKER IMPLANT [name]  09/09/2016   Procedure: Pacemaker Implant;  Surgeon: Marinus Maw, MD;  Location: [facility] INVASIVE CV LAB;  Service: Cardiovascular;  Laterality: [name];  . REPLACEMENT TOTAL KNEE BILATERAL    . TUBAL LIGATION      REVIEW OF SYSTEMS:   Review of Systems  Constitutional: Negative for appetite change, chills, fatigue, fever and unexpected weight change.  HENT:   Negative for mouth sores, nosebleeds, sore throat and trouble swallowing.   Eyes: Negative for eye problems and icterus.  Respiratory: Negative for cough, hemoptysis, shortness of breath and wheezing.   Cardiovascular: Negative for chest pain.  Positive for lower extremity edema. Gastrointestinal: Negative for abdominal pain, constipation, diarrhea, nausea and vomiting.  Genitourinary: Negative for bladder incontinence, difficulty urinating, dysuria, frequency and hematuria.   Musculoskeletal: Negative for back pain, neck pain and neck stiffness.  Skin: Negative for itching and rash.  Neurological: Negative for dizziness, extremity weakness, headaches, light-headedness and seizures.  Hematological: Negative for adenopathy. Does not bruise/bleed easily.  Psychiatric/Behavioral: Negative for confusion, depression and sleep disturbance. The patient is not nervous/anxious.     PHYSICAL EXAMINATION:  Blood pressure 116/67, pulse 60, temperature 98.7 F (37.1 C), temperature source Oral, resp. rate 20, height 5\' 5"  (1.651 m), SpO2 100 %.  ECOG PERFORMANCE STATUS: 1 - Symptomatic but completely ambulatory  Physical Exam  Constitutional: Oriented to person, place, and time and well-developed, well-nourished, and in no distress. No distress.  HENT:  Head: Normocephalic and atraumatic.  Mouth/Throat: Oropharynx is clear and moist. No oropharyngeal exudate.  Eyes: Conjunctivae are normal. Right eye exhibits no discharge. Left eye exhibits no discharge. No scleral icterus.  Neck: Normal range of motion. Neck supple.  Cardiovascular: Normal  rate, regular rhythm, normal heart sounds and intact distal pulses.  1+ bilateral lower extremity edema. Pulmonary/Chest: Effort normal and breath sounds normal. No respiratory distress. No wheezes. No rales.  Abdominal: Soft. Bowel sounds are normal. Exhibits no distension and no mass. There is no tenderness.  Musculoskeletal: Normal range of motion.   Lymphadenopathy:    No cervical adenopathy.  Neurological: Alert and oriented to person, place, and time. Exhibits normal muscle tone. Coordination normal.  Skin: Skin is warm and dry. No rash noted. Not diaphoretic. No erythema. No pallor.  Psychiatric: Mood, memory and judgment normal.  Vitals reviewed.  LABORATORY DATA: Lab Results  Component Value Date   WBC 8.2 03/08/2018   HGB 9.0 (L) 03/08/2018   HCT 28.8 (L) 03/08/2018   MCV 96.0 03/08/2018   PLT 207 03/08/2018      Chemistry      Component Value Date/Time   NA 143 03/08/2018 0955   NA 139 08/19/2016 1010   K 3.9 03/08/2018 0955   CL 104 03/08/2018 0955   CO2 29 03/08/2018 0955   BUN 27 (H) 03/08/2018  0955   BUN 25 08/19/2016 1010   CREATININE 1.94 (H) 03/08/2018 0955      Component Value Date/Time   CALCIUM 8.9 03/08/2018 0955   ALKPHOS 77 03/08/2018 0955   AST 10 (L) 03/08/2018 0955   ALT 9 03/08/2018 0955   BILITOT 0.5 03/08/2018 0955       RADIOGRAPHIC STUDIES:  No results found.   ASSESSMENT/PLAN:  Warm antibody hemolytic anemia (HCC) This is a very pleasant 77 year old African-American female recently diagnosed with autoimmune hemolytic anemia. The patient is currently on treatment with prednisone5mg  p.o. daily. CBC from today was reviewed with Dr. Arbutus PedMohamed. Hemoglobin is down from 11.6 to 9.0.  LDH and haptoglobin are pending.  The patient has chronic kidney disease and may have a component of renal insufficiency.  Will await LDH and haptoglobin from today before making further recommendations on treatment.  She will continue the prednisone 5 mg  daily for now.  May consider IVIG if she has evidence of hemolysis.  We will schedule a follow-up appointment for the patient once we have the rest of her lab work.  The patient was advised to call immediately if she has any concerning symptoms in the interval. The patient voices understanding of current disease status and treatment options and is in agreement with the current care plan.  All questions were answered. The patient knows to call the clinic with any problems, questions or concerns. We can certainly see the patient much sooner if necessary.   No orders of the defined types were placed in this encounter.    Clenton PareKristin Broderic Bara, DNP, AGPCNP-BC, AOCNP 03/08/18

## 2018-03-08 NOTE — Progress Notes (Signed)
Medical treatment/procedure(s) were performed by non-physician practitioner and as supervising physician I was immediately available for consultation/collaboration. I agree with above. Essance Gatti A Jameer Storie, MD  

## 2018-03-09 ENCOUNTER — Telehealth: Payer: Self-pay | Admitting: Emergency Medicine

## 2018-03-09 ENCOUNTER — Other Ambulatory Visit: Payer: Self-pay | Admitting: Oncology

## 2018-03-09 ENCOUNTER — Telehealth: Payer: Self-pay | Admitting: Oncology

## 2018-03-09 DIAGNOSIS — D591 Other autoimmune hemolytic anemias: Principal | ICD-10-CM

## 2018-03-09 DIAGNOSIS — D5911 Warm autoimmune hemolytic anemia: Secondary | ICD-10-CM

## 2018-03-09 LAB — HAPTOGLOBIN: HAPTOGLOBIN: 166 mg/dL (ref 34–200)

## 2018-03-09 MED ORDER — PREDNISONE 5 MG PO TABS: 5.0000 mg | ORAL_TABLET | Freq: Every day | ORAL | 0 refills | Status: DC

## 2018-03-09 NOTE — Telephone Encounter (Signed)
Scheduled appt per 12/4 schmessage - vmail is full - unable to leave message - sent reminder letter in the mail with appt date and time

## 2018-03-09 NOTE — Telephone Encounter (Addendum)
Patients daughter verbalized understanding of this note. Refill request sent to Clenton PareKristin Curcio NP  ----- Message from Myrtis SerKristin R Curcio, NP sent at 03/09/2018 12:47 PM EST ----- Call patient's daughter [name]([name]) [phone] and let her know that the remaining labs look okay. Dr. Arbutus PedMohamed wants her to stay on Prednisone 5 mg daily for now and see her back in 3-4 weeks with repeat labs. I have sent a scheduling message for her f/u appt. Let me know if she needs an additional refill on the Prednisone.    Thanks

## 2018-03-22 ENCOUNTER — Ambulatory Visit (INDEPENDENT_AMBULATORY_CARE_PROVIDER_SITE_OTHER): Payer: Medicare Other | Admitting: General Practice

## 2018-03-22 DIAGNOSIS — Z7901 Long term (current) use of anticoagulants: Secondary | ICD-10-CM | POA: Diagnosis not present

## 2018-03-22 DIAGNOSIS — I4821 Permanent atrial fibrillation: Secondary | ICD-10-CM

## 2018-03-22 LAB — POCT INR: INR: 1.6 — AB (ref 2.0–3.0)

## 2018-03-22 NOTE — Progress Notes (Signed)
Medical treatment/procedure(s) were performed by non-physician practitioner and as supervising physician I was immediately available for consultation/collaboration. I agree with above. Elizabeth A Crawford, MD  

## 2018-03-22 NOTE — Patient Instructions (Addendum)
Pre visit review using our clinic review tool, if applicable. No additional management support is needed unless otherwise documented below in the visit note.  Take 1 1/2 tablets today and tomorrow (12/17) and then start taking 1 tablet daily except 1 1/2 tablets on Wednesdays.  Re-check in 3 to 4 weeks.

## 2018-03-29 ENCOUNTER — Inpatient Hospital Stay: Payer: Medicare Other

## 2018-03-29 ENCOUNTER — Inpatient Hospital Stay: Payer: Medicare Other | Admitting: Internal Medicine

## 2018-04-19 ENCOUNTER — Ambulatory Visit: Payer: Medicare Other | Admitting: General Practice

## 2018-04-19 NOTE — Progress Notes (Signed)
Medical treatment/procedure(s) were performed by non-physician practitioner and as supervising physician I was immediately available for consultation/collaboration. I agree with above. Kaniesha Barile A Scout Guyett, MD  

## 2018-04-19 NOTE — Patient Instructions (Signed)
Opened in error

## 2018-04-26 ENCOUNTER — Other Ambulatory Visit (HOSPITAL_COMMUNITY): Payer: Medicare Other

## 2018-05-05 ENCOUNTER — Ambulatory Visit (HOSPITAL_COMMUNITY): Payer: Medicare HMO | Attending: Cardiovascular Disease

## 2018-05-05 DIAGNOSIS — I340 Nonrheumatic mitral (valve) insufficiency: Secondary | ICD-10-CM | POA: Diagnosis not present

## 2018-05-05 DIAGNOSIS — I4821 Permanent atrial fibrillation: Secondary | ICD-10-CM | POA: Insufficient documentation

## 2018-05-18 ENCOUNTER — Encounter: Payer: Self-pay | Admitting: *Deleted

## 2018-07-05 ENCOUNTER — Other Ambulatory Visit: Payer: Self-pay

## 2018-07-05 ENCOUNTER — Ambulatory Visit (INDEPENDENT_AMBULATORY_CARE_PROVIDER_SITE_OTHER): Payer: Medicare HMO | Admitting: General Practice

## 2018-07-05 ENCOUNTER — Other Ambulatory Visit: Payer: Self-pay | Admitting: General Practice

## 2018-07-05 DIAGNOSIS — Z7901 Long term (current) use of anticoagulants: Secondary | ICD-10-CM | POA: Diagnosis not present

## 2018-07-05 DIAGNOSIS — I4821 Permanent atrial fibrillation: Secondary | ICD-10-CM

## 2018-07-05 LAB — POCT INR: INR: 1 — AB (ref 2.0–3.0)

## 2018-07-05 MED ORDER — WARFARIN SODIUM 5 MG PO TABS: ORAL_TABLET | ORAL | 0 refills | Status: DC

## 2018-07-05 NOTE — Progress Notes (Signed)
Medical treatment/procedure(s) were performed by non-physician practitioner and as supervising physician I was immediately available for consultation/collaboration. I agree with above. Avenly Roberge A Darrill Vreeland, MD  

## 2018-07-05 NOTE — Patient Instructions (Addendum)
Pre visit review using our clinic review tool, if applicable. No additional management support is needed unless otherwise documented below in the visit note.  Take 1 1/2 tablets today, 2 tablets tomorrow (4/1) and 1 1/2 tablets on Thursday.  On Friday, Sat, Sun and Monday take 1 tablet.  Re-check in 1 week.  Please do not eat greens until we get your INR regulated.

## 2018-07-12 ENCOUNTER — Ambulatory Visit: Payer: Medicare HMO

## 2018-07-18 ENCOUNTER — Telehealth: Payer: Self-pay | Admitting: *Deleted

## 2018-07-18 NOTE — Telephone Encounter (Signed)
I spoke to patient's daughter. Patient scheduled virtual visit DOXY on 07/19/18 @ 9:40am.    Copied from CRM [id number]. Topic: General - Other >> Jul 15, 2018 10:42 AM Percival Spanish wrote:   Pt req an appt for a blister that busted on her foot per her daughter [name] >> Jul 18, 2018  8:36 AM Marquis Buggy A wrote: Can this be a virtual visit?

## 2018-07-19 ENCOUNTER — Encounter: Payer: Self-pay | Admitting: Internal Medicine

## 2018-07-19 ENCOUNTER — Ambulatory Visit (INDEPENDENT_AMBULATORY_CARE_PROVIDER_SITE_OTHER): Payer: Medicare HMO | Admitting: Internal Medicine

## 2018-07-19 ENCOUNTER — Other Ambulatory Visit: Payer: Self-pay

## 2018-07-19 ENCOUNTER — Ambulatory Visit: Payer: Medicare HMO | Admitting: Internal Medicine

## 2018-07-19 ENCOUNTER — Ambulatory Visit (INDEPENDENT_AMBULATORY_CARE_PROVIDER_SITE_OTHER): Payer: Medicare HMO | Admitting: General Practice

## 2018-07-19 DIAGNOSIS — M79671 Pain in right foot: Secondary | ICD-10-CM

## 2018-07-19 DIAGNOSIS — I4821 Permanent atrial fibrillation: Secondary | ICD-10-CM

## 2018-07-19 DIAGNOSIS — Z7901 Long term (current) use of anticoagulants: Secondary | ICD-10-CM | POA: Diagnosis not present

## 2018-07-19 DIAGNOSIS — M79673 Pain in unspecified foot: Secondary | ICD-10-CM | POA: Insufficient documentation

## 2018-07-19 LAB — POCT INR: INR: 1.4 — AB (ref 2.0–3.0)

## 2018-07-19 MED ORDER — SULFAMETHOXAZOLE-TRIMETHOPRIM 800-160 MG PO TABS: 1.0000 | ORAL_TABLET | Freq: Two times a day (BID) | ORAL | 0 refills | Status: DC

## 2018-07-19 NOTE — Patient Instructions (Signed)
Pre visit review using our clinic review tool, if applicable. No additional management support is needed unless otherwise documented below in the visit note.  Please take 1 1/2 tablets today, 2 tablets tomorrow and 1 1/2 tablets on Thursday.  On Friday start taking 1 tablet daily except 1 1/2 tablets on Mon Wed and Fri.

## 2018-07-19 NOTE — Progress Notes (Signed)
Virtual Visit via Video Note  I connected with Robin Snow on 07/19/18 at  2:00 PM EDT by a video enabled telemedicine application and verified that I am speaking with the correct person using two identifiers.   I discussed the limitations of evaluation and management by telemedicine and the availability of in person appointments. The patient expressed understanding and agreed to proceed.  History of Present Illness: The patient is a 78 y.o. female with visit for right heel ulcer/blister which burst within the last week. Her daughter helps to provide history. Started about 1 week ago. Now is draining out pus and smelling. No red rash around the ulcer. Denies fevers or chills. Has minimal pain in the heel. Overall it is worsening. Has tried hydrogen peroxide on the wound daily and changing gauze dressings right heel.  Observations/Objective: Appearance: stable, breathing appears normal, casual grooming, abdomen does not appear distended, throat not examined, pupils equal and reactive, mental status is stable, right heel with wound no surrounding erythema, dressing appears with yellowish drainage  Assessment and Plan: See problem oriented charting  Follow Up Instructions: rx for bactrim 1 week, call back for worsening or lack of resolution  I discussed the assessment and treatment plan with the patient. The patient was provided an opportunity to ask questions and all were answered. The patient agreed with the plan and demonstrated an understanding of the instructions.   The patient was advised to call back or seek an in-person evaluation if the symptoms worsen or if the condition fails to improve as anticipated.  Robin Broker, MD

## 2018-07-19 NOTE — Assessment & Plan Note (Signed)
Suspect infection given purulent drainage. No surrounding cellulitis at this time. Rx for bactrim 1 week and then check in on wound.

## 2018-07-19 NOTE — Progress Notes (Signed)
Medical treatment/procedure(s) were performed by non-physician practitioner and as supervising physician I was immediately available for consultation/collaboration. I agree with above. Elizabeth A Crawford, MD  

## 2018-07-26 ENCOUNTER — Ambulatory Visit: Payer: Medicare HMO

## 2018-08-17 ENCOUNTER — Other Ambulatory Visit: Payer: Self-pay | Admitting: Internal Medicine

## 2018-08-17 DIAGNOSIS — I495 Sick sinus syndrome: Secondary | ICD-10-CM

## 2018-08-17 DIAGNOSIS — Z7901 Long term (current) use of anticoagulants: Secondary | ICD-10-CM

## 2018-08-17 DIAGNOSIS — Z950 Presence of cardiac pacemaker: Secondary | ICD-10-CM

## 2018-08-17 DIAGNOSIS — I482 Chronic atrial fibrillation, unspecified: Secondary | ICD-10-CM

## 2018-09-16 ENCOUNTER — Telehealth: Payer: Self-pay

## 2018-09-16 ENCOUNTER — Other Ambulatory Visit: Payer: Self-pay | Admitting: Internal Medicine

## 2018-09-16 DIAGNOSIS — Z950 Presence of cardiac pacemaker: Secondary | ICD-10-CM

## 2018-09-16 DIAGNOSIS — Z7901 Long term (current) use of anticoagulants: Secondary | ICD-10-CM

## 2018-09-16 DIAGNOSIS — I482 Chronic atrial fibrillation, unspecified: Secondary | ICD-10-CM

## 2018-09-16 DIAGNOSIS — I495 Sick sinus syndrome: Secondary | ICD-10-CM

## 2018-09-16 MED ORDER — FUROSEMIDE 40 MG PO TABS: 40.0000 mg | ORAL_TABLET | Freq: Two times a day (BID) | ORAL | 0 refills | Status: DC

## 2018-09-16 MED ORDER — WARFARIN SODIUM 5 MG PO TABS: ORAL_TABLET | ORAL | 0 refills | Status: DC

## 2018-09-16 MED ORDER — LOVASTATIN 20 MG PO TABS: 20.0000 mg | ORAL_TABLET | Freq: Every day | ORAL | 1 refills | Status: DC

## 2018-09-16 MED ORDER — LOVASTATIN 20 MG PO TABS: 20.0000 mg | ORAL_TABLET | Freq: Every day | ORAL | 0 refills | Status: DC

## 2018-09-16 MED ORDER — FUROSEMIDE 40 MG PO TABS: 40.0000 mg | ORAL_TABLET | Freq: Two times a day (BID) | ORAL | 1 refills | Status: DC

## 2018-09-16 MED ORDER — METOPROLOL TARTRATE 50 MG PO TABS: 50.0000 mg | ORAL_TABLET | Freq: Two times a day (BID) | ORAL | 0 refills | Status: DC

## 2018-09-16 MED ORDER — GABAPENTIN 300 MG PO CAPS: 300.0000 mg | ORAL_CAPSULE | Freq: Three times a day (TID) | ORAL | 0 refills | Status: DC

## 2018-09-16 MED ORDER — METOPROLOL TARTRATE 50 MG PO TABS: 50.0000 mg | ORAL_TABLET | Freq: Two times a day (BID) | ORAL | 1 refills | Status: DC

## 2018-09-16 MED ORDER — POTASSIUM CHLORIDE CRYS ER 20 MEQ PO TBCR: 20.0000 meq | EXTENDED_RELEASE_TABLET | ORAL | 0 refills | Status: DC

## 2018-09-16 MED ORDER — POTASSIUM CHLORIDE CRYS ER 20 MEQ PO TBCR: 20.0000 meq | EXTENDED_RELEASE_TABLET | ORAL | 1 refills | Status: DC

## 2018-09-16 NOTE — Telephone Encounter (Signed)
Error

## 2018-09-16 NOTE — Telephone Encounter (Signed)
Copied from [place] [id number]. Topic: Quick Communication - Rx Refill/Question >> Sep 16, 2018  8:49 AM Rainey Pines A wrote: Medication: furosemide (LASIX) 40 MG tablet ,gabapentin (NEURONTIN) 300 MG capsule,lovastatin (MEVACOR) 20 MG tablet ,metoprolol tartrate (LOPRESSOR) 50 MG tablet ,potassium chloride SA (K-DUR,KLOR-CON) 20 MEQ tablet,warfarin (COUMADIN) 5 MG tablet (Pharmacy is requesting medications be sent over.)  Has the patient contacted their pharmacy?Yes (Agent: If no, request that the patient contact the pharmacy for the refill.) (Agent: If yes, when and what did the pharmacy advise?)Contact PCP  Preferred Pharmacy (with phone number or street name): [facility], [place] [phone] (Phone) [phone] (Fax)    Agent: Please be advised that RX refills may take up to 3 business days. We ask that you follow-up with your pharmacy.

## 2018-09-16 NOTE — Telephone Encounter (Signed)
Trying to resend medications to [facility] as patient is switching pharmacies. Patient will need a appointment for further refills, I put that in the Sig on medication. Also patient asking for refill of warfarin I was going to route to [facility]indy to refill

## 2018-09-20 ENCOUNTER — Other Ambulatory Visit: Payer: Self-pay

## 2018-09-20 MED ORDER — POTASSIUM CHLORIDE CRYS ER 20 MEQ PO TBCR: EXTENDED_RELEASE_TABLET | ORAL | 0 refills | Status: DC

## 2019-02-02 ENCOUNTER — Emergency Department (HOSPITAL_COMMUNITY): Payer: Medicare HMO

## 2019-02-02 ENCOUNTER — Inpatient Hospital Stay (HOSPITAL_COMMUNITY)
Admission: EM | Admit: 2019-02-02 | Discharge: 2019-02-08 | DRG: 871 | Disposition: A | Discharge status: Home / Self Care | Payer: Medicare HMO | Attending: Internal Medicine | Admitting: Internal Medicine

## 2019-02-02 DIAGNOSIS — N182 Chronic kidney disease, stage 2 (mild): Secondary | ICD-10-CM | POA: Diagnosis present

## 2019-02-02 DIAGNOSIS — E118 Type 2 diabetes mellitus with unspecified complications: Secondary | ICD-10-CM | POA: Diagnosis present

## 2019-02-02 DIAGNOSIS — E785 Hyperlipidemia, unspecified: Secondary | ICD-10-CM | POA: Diagnosis present

## 2019-02-02 DIAGNOSIS — Z87891 Personal history of nicotine dependence: Secondary | ICD-10-CM

## 2019-02-02 DIAGNOSIS — I872 Venous insufficiency (chronic) (peripheral): Secondary | ICD-10-CM | POA: Diagnosis present

## 2019-02-02 DIAGNOSIS — Z792 Long term (current) use of antibiotics: Secondary | ICD-10-CM

## 2019-02-02 DIAGNOSIS — I495 Sick sinus syndrome: Secondary | ICD-10-CM | POA: Diagnosis present

## 2019-02-02 DIAGNOSIS — M25561 Pain in right knee: Secondary | ICD-10-CM | POA: Diagnosis present

## 2019-02-02 DIAGNOSIS — M542 Cervicalgia: Secondary | ICD-10-CM | POA: Diagnosis present

## 2019-02-02 DIAGNOSIS — Z9181 History of falling: Secondary | ICD-10-CM

## 2019-02-02 DIAGNOSIS — I482 Chronic atrial fibrillation, unspecified: Secondary | ICD-10-CM

## 2019-02-02 DIAGNOSIS — Z823 Family history of stroke: Secondary | ICD-10-CM

## 2019-02-02 DIAGNOSIS — E1122 Type 2 diabetes mellitus with diabetic chronic kidney disease: Secondary | ICD-10-CM | POA: Diagnosis not present

## 2019-02-02 DIAGNOSIS — N179 Acute kidney failure, unspecified: Secondary | ICD-10-CM | POA: Diagnosis present

## 2019-02-02 DIAGNOSIS — A4151 Sepsis due to Escherichia coli [E. coli]: Principal | ICD-10-CM | POA: Diagnosis present

## 2019-02-02 DIAGNOSIS — Z79899 Other long term (current) drug therapy: Secondary | ICD-10-CM

## 2019-02-02 DIAGNOSIS — D5911 Warm autoimmune hemolytic anemia: Secondary | ICD-10-CM | POA: Diagnosis present

## 2019-02-02 DIAGNOSIS — G8929 Other chronic pain: Secondary | ICD-10-CM | POA: Diagnosis present

## 2019-02-02 DIAGNOSIS — D591 Autoimmune hemolytic anemia, unspecified: Secondary | ICD-10-CM | POA: Diagnosis not present

## 2019-02-02 DIAGNOSIS — A419 Sepsis, unspecified organism: Secondary | ICD-10-CM | POA: Diagnosis not present

## 2019-02-02 DIAGNOSIS — R791 Abnormal coagulation profile: Secondary | ICD-10-CM | POA: Diagnosis not present

## 2019-02-02 DIAGNOSIS — I4891 Unspecified atrial fibrillation: Secondary | ICD-10-CM | POA: Diagnosis present

## 2019-02-02 DIAGNOSIS — Z6841 Body Mass Index (BMI) 40.0 and over, adult: Secondary | ICD-10-CM | POA: Diagnosis not present

## 2019-02-02 DIAGNOSIS — D696 Thrombocytopenia, unspecified: Secondary | ICD-10-CM | POA: Diagnosis not present

## 2019-02-02 DIAGNOSIS — N3001 Acute cystitis with hematuria: Principal | ICD-10-CM

## 2019-02-02 DIAGNOSIS — R6520 Severe sepsis without septic shock: Secondary | ICD-10-CM | POA: Diagnosis present

## 2019-02-02 DIAGNOSIS — N183 Chronic kidney disease, stage 3 unspecified: Secondary | ICD-10-CM | POA: Diagnosis present

## 2019-02-02 DIAGNOSIS — N390 Urinary tract infection, site not specified: Secondary | ICD-10-CM | POA: Diagnosis present

## 2019-02-02 DIAGNOSIS — Z7901 Long term (current) use of anticoagulants: Secondary | ICD-10-CM

## 2019-02-02 DIAGNOSIS — Z885 Allergy status to narcotic agent status: Secondary | ICD-10-CM

## 2019-02-02 DIAGNOSIS — Z96653 Presence of artificial knee joint, bilateral: Secondary | ICD-10-CM | POA: Diagnosis present

## 2019-02-02 DIAGNOSIS — I5032 Chronic diastolic (congestive) heart failure: Secondary | ICD-10-CM | POA: Diagnosis not present

## 2019-02-02 DIAGNOSIS — I130 Hypertensive heart and chronic kidney disease with heart failure and stage 1 through stage 4 chronic kidney disease, or unspecified chronic kidney disease: Secondary | ICD-10-CM | POA: Diagnosis not present

## 2019-02-02 DIAGNOSIS — I4821 Permanent atrial fibrillation: Secondary | ICD-10-CM | POA: Diagnosis present

## 2019-02-02 DIAGNOSIS — G459 Transient cerebral ischemic attack, unspecified: Secondary | ICD-10-CM | POA: Diagnosis not present

## 2019-02-02 DIAGNOSIS — M2550 Pain in unspecified joint: Secondary | ICD-10-CM | POA: Diagnosis not present

## 2019-02-02 DIAGNOSIS — M00861 Arthritis due to other bacteria, right knee: Secondary | ICD-10-CM | POA: Diagnosis not present

## 2019-02-02 DIAGNOSIS — Z20828 Contact with and (suspected) exposure to other viral communicable diseases: Secondary | ICD-10-CM | POA: Diagnosis not present

## 2019-02-02 DIAGNOSIS — Z7952 Long term (current) use of systemic steroids: Secondary | ICD-10-CM

## 2019-02-02 DIAGNOSIS — G9341 Metabolic encephalopathy: Secondary | ICD-10-CM | POA: Diagnosis present

## 2019-02-02 DIAGNOSIS — Z886 Allergy status to analgesic agent status: Secondary | ICD-10-CM

## 2019-02-02 DIAGNOSIS — S0990XA Unspecified injury of head, initial encounter: Secondary | ICD-10-CM | POA: Diagnosis not present

## 2019-02-02 DIAGNOSIS — W19XXXA Unspecified fall, initial encounter: Secondary | ICD-10-CM | POA: Diagnosis not present

## 2019-02-02 DIAGNOSIS — I10 Essential (primary) hypertension: Secondary | ICD-10-CM | POA: Diagnosis present

## 2019-02-02 DIAGNOSIS — I272 Pulmonary hypertension, unspecified: Secondary | ICD-10-CM | POA: Diagnosis present

## 2019-02-02 DIAGNOSIS — Z7401 Bed confinement status: Secondary | ICD-10-CM | POA: Diagnosis not present

## 2019-02-02 DIAGNOSIS — R000 Tachycardia, unspecified: Secondary | ICD-10-CM | POA: Diagnosis not present

## 2019-02-02 DIAGNOSIS — Z8673 Personal history of transient ischemic attack (TIA), and cerebral infarction without residual deficits: Secondary | ICD-10-CM

## 2019-02-02 DIAGNOSIS — E662 Morbid (severe) obesity with alveolar hypoventilation: Secondary | ICD-10-CM | POA: Diagnosis present

## 2019-02-02 DIAGNOSIS — Z1611 Resistance to penicillins: Secondary | ICD-10-CM | POA: Diagnosis present

## 2019-02-02 DIAGNOSIS — Z950 Presence of cardiac pacemaker: Secondary | ICD-10-CM

## 2019-02-02 DIAGNOSIS — I499 Cardiac arrhythmia, unspecified: Secondary | ICD-10-CM | POA: Diagnosis not present

## 2019-02-02 DIAGNOSIS — M7989 Other specified soft tissue disorders: Secondary | ICD-10-CM | POA: Diagnosis not present

## 2019-02-02 DIAGNOSIS — Z79891 Long term (current) use of opiate analgesic: Secondary | ICD-10-CM

## 2019-02-02 DIAGNOSIS — R509 Fever, unspecified: Secondary | ICD-10-CM | POA: Diagnosis not present

## 2019-02-02 DIAGNOSIS — R4182 Altered mental status, unspecified: Secondary | ICD-10-CM | POA: Diagnosis not present

## 2019-02-02 DIAGNOSIS — R531 Weakness: Secondary | ICD-10-CM | POA: Diagnosis not present

## 2019-02-02 DIAGNOSIS — Z9851 Tubal ligation status: Secondary | ICD-10-CM

## 2019-02-02 DIAGNOSIS — S199XXA Unspecified injury of neck, initial encounter: Secondary | ICD-10-CM | POA: Diagnosis not present

## 2019-02-02 LAB — CBC WITH DIFFERENTIAL/PLATELET
Abs Immature Granulocytes: 0.52 10*3/uL — ABNORMAL HIGH (ref 0.00–0.07)
Basophils Absolute: 0.1 10*3/uL (ref 0.0–0.1)
Basophils Relative: 0 %
Eosinophils Absolute: 0 10*3/uL (ref 0.0–0.5)
Eosinophils Relative: 0 %
HCT: 33.1 % — ABNORMAL LOW (ref 36.0–46.0)
Hemoglobin: 11.1 g/dL — ABNORMAL LOW (ref 12.0–15.0)
Immature Granulocytes: 2 %
Lymphocytes Relative: 8 %
Lymphs Abs: 1.7 10*3/uL (ref 0.7–4.0)
MCH: 28.2 pg (ref 26.0–34.0)
MCHC: 33.5 g/dL (ref 30.0–36.0)
MCV: 84 fL (ref 80.0–100.0)
Monocytes Absolute: 2.1 10*3/uL — ABNORMAL HIGH (ref 0.1–1.0)
Monocytes Relative: 9 %
Neutro Abs: 17.4 10*3/uL — ABNORMAL HIGH (ref 1.7–7.7)
Neutrophils Relative %: 81 %
Platelets: 147 10*3/uL — ABNORMAL LOW (ref 150–400)
RBC: 3.94 MIL/uL (ref 3.87–5.11)
RDW: 15.9 % — ABNORMAL HIGH (ref 11.5–15.5)
WBC: 21.8 10*3/uL — ABNORMAL HIGH (ref 4.0–10.5)
nRBC: 0.1 % (ref 0.0–0.2)

## 2019-02-02 LAB — COMPREHENSIVE METABOLIC PANEL
ALT: 16 U/L (ref 0–44)
AST: 28 U/L (ref 15–41)
Albumin: 2.8 g/dL — ABNORMAL LOW (ref 3.5–5.0)
Alkaline Phosphatase: 91 U/L (ref 38–126)
Anion gap: 12 (ref 5–15)
BUN: 31 mg/dL — ABNORMAL HIGH (ref 8–23)
CO2: 25 mmol/L (ref 22–32)
Calcium: 8.9 mg/dL (ref 8.9–10.3)
Chloride: 105 mmol/L (ref 98–111)
Creatinine, Ser: 1.64 mg/dL — ABNORMAL HIGH (ref 0.44–1.00)
GFR calc Af Amer: 34 mL/min — ABNORMAL LOW (ref >60)
GFR calc non Af Amer: 29 mL/min — ABNORMAL LOW (ref >60)
Glucose, Bld: 142 mg/dL — ABNORMAL HIGH (ref 70–99)
Potassium: 3.7 mmol/L (ref 3.5–5.1)
Sodium: 142 mmol/L (ref 135–145)
Total Bilirubin: 1.4 mg/dL — ABNORMAL HIGH (ref 0.3–1.2)
Total Protein: 7.6 g/dL (ref 6.5–8.1)

## 2019-02-02 LAB — URINALYSIS, ROUTINE W REFLEX MICROSCOPIC
Bilirubin Urine: NEGATIVE
Glucose, UA: NEGATIVE mg/dL
Ketones, ur: NEGATIVE mg/dL
Nitrite: POSITIVE — AB
Protein, ur: NEGATIVE mg/dL
Specific Gravity, Urine: 1.013 (ref 1.005–1.030)
pH: 5 (ref 5.0–8.0)

## 2019-02-02 LAB — LACTIC ACID, PLASMA
Lactic Acid, Venous: 2.1 mmol/L (ref 0.5–1.9)
Lactic Acid, Venous: 1.6 mmol/L (ref 0.5–1.9)

## 2019-02-02 LAB — APTT: aPTT: 28 seconds (ref 24–36)

## 2019-02-02 LAB — PROTIME-INR
INR: 1.2 (ref 0.8–1.2)
Prothrombin Time: 15.2 seconds (ref 11.4–15.2)

## 2019-02-02 LAB — C-REACTIVE PROTEIN: CRP: 19.7 mg/dL — ABNORMAL HIGH (ref <1.0)

## 2019-02-02 LAB — MAGNESIUM: Magnesium: 2.1 mg/dL (ref 1.7–2.4)

## 2019-02-02 MED ORDER — VANCOMYCIN HCL IN DEXTROSE 1-5 GM/200ML-% IV SOLN: 1000.0000 mg | Freq: Once | INTRAVENOUS | Status: DC

## 2019-02-02 MED ORDER — SODIUM CHLORIDE 0.9 % IV SOLN
1000.0000 mL | INTRAVENOUS | Status: DC
  Administered 2019-02-02 – 2019-02-06 (×7): 1000 mL via INTRAVENOUS

## 2019-02-02 MED ORDER — VANCOMYCIN HCL IN DEXTROSE 750-5 MG/150ML-% IV SOLN: 750.0000 mg | INTRAVENOUS | Status: DC

## 2019-02-02 MED ORDER — SODIUM CHLORIDE 0.9 % IV SOLN
2.0000 g | Freq: Once | INTRAVENOUS | Status: AC
  Administered 2019-02-02: 2 g via INTRAVENOUS
  Filled 2019-02-02: qty 2

## 2019-02-02 MED ORDER — SODIUM CHLORIDE 0.9 % IV BOLUS
500.0000 mL | Freq: Once | INTRAVENOUS | Status: AC
  Administered 2019-02-02: 500 mL via INTRAVENOUS

## 2019-02-02 MED ORDER — METRONIDAZOLE IN NACL 5-0.79 MG/ML-% IV SOLN
500.0000 mg | Freq: Once | INTRAVENOUS | Status: AC
  Administered 2019-02-02: 500 mg via INTRAVENOUS
  Filled 2019-02-02: qty 100

## 2019-02-02 MED ORDER — VANCOMYCIN HCL 10 G IV SOLR
1750.0000 mg | Freq: Once | INTRAVENOUS | Status: AC
  Administered 2019-02-02: 1750 mg via INTRAVENOUS
  Filled 2019-02-02: qty 1750

## 2019-02-02 MED ORDER — SODIUM CHLORIDE 0.9 % IV SOLN
2.0000 g | Freq: Two times a day (BID) | INTRAVENOUS | Status: DC
  Administered 2019-02-03: 2 g via INTRAVENOUS
  Filled 2019-02-02: qty 2

## 2019-02-02 MED ORDER — ACETAMINOPHEN 500 MG PO TABS
1000.0000 mg | ORAL_TABLET | Freq: Once | ORAL | Status: AC
  Administered 2019-02-02: 1000 mg via ORAL
  Filled 2019-02-02: qty 2

## 2019-02-02 NOTE — ED Triage Notes (Signed)
Came in via ems; c/o fall d/t right knee weakness. Reported hx of Afib and is RVR on scene. EMS gave Cardizem 10 mg x2. EMS reported altered mentation as well worsenig x 2 weeks accrdng to family.

## 2019-02-02 NOTE — Progress Notes (Signed)
Pharmacy Antibiotic Note  Robin Snow is a 78 y.o. female with PMH of CKD III, stroke, AKI, and Afib (on warfarin) who presents via EMS after c/o fall d/t right knee weakness. EMS reports altered mental status with a worsening of mentation.There is concern for sepsis. Pharmacy has been consulted for Vancomycin and Cefepime dosing.  Pt is febrile with a Temp of 101.5. WBC is elevated at 21.8. Scr is elevated at 1.61 (baseline unknown but seems to be ~1.4).   Plan: Vancomycin 1,750 mg IV X 1 as loading dose Vancomycin 750 mg IV Q 24 hrs. Goal AUC 400-550. Expected AUC: 475.4 SCr used: 1.64 Cefepime IV 2g q12 hours Monitor renal function, WBC, temp, and clinical status Obtain vancomycin levels as needed  Height: 5\' 7"  (170.2 cm) Weight: 180 lb (81.6 kg) IBW/kg (Calculated) : 61.6  Temp (24hrs), Avg:101.1 F (38.4 C), Min:100.7 F (38.2 C), Max:101.5 F (38.6 C)  No results for input(s): WBC, CREATININE, LATICACIDVEN, VANCOTROUGH, VANCOPEAK, VANCORANDOM, GENTTROUGH, GENTPEAK, GENTRANDOM, TOBRATROUGH, TOBRAPEAK, TOBRARND, AMIKACINPEAK, AMIKACINTROU, AMIKACIN in the last 168 hours.  CrCl cannot be calculated (Patient's most recent lab result is older than the maximum 21 days allowed.).    Allergies  Allergen Reactions  . Aspirin Hives  . Rofecoxib Hives  . Hydrocodone Hives    Tolerates oxycodone and tramadol    Antimicrobials this admission: 10/29 Metronidazole >>  10/29 Vancomycin >>  10/29 Cefepime >>   Microbiology results: 10/29 BCx: Sent 10/29 UCx: Sent  Thank you for allowing pharmacy to be a part of this patient's care.  Sherren Kerns, PharmD PGY1 Acute Care Pharmacy Resident 02/02/2019 8:00 PM

## 2019-02-02 NOTE — ED Notes (Signed)
Patient transported to CT 

## 2019-02-02 NOTE — ED Provider Notes (Signed)
[facility] EMERGENCY DEPARTMENT Provider Note   CSN: [id number] Arrival date & time: 02/02/19  1909     History   Chief Complaint Chief Complaint  Patient presents with  . Fall  . Altered Mental Status    HPI Robin Snow is a 78 y.o. female.     78 y.o female with a PMH CKD III, Stroke, AKI, Afib presents to the ED via EMS with a chief complaint of AMS and elevated HR. According to family members, patient was attempted to transfer onto a chair when she suddenly fell and hit her knee, unknown whether she hit her head or not.  She is currently on warfarin for anticoagulation therapy for her history of A. fib.  EMS, patient lying on the floor, in her room with seem to be as hot as 95 degrees, they recorded an oral temp of 100.7.  Patient arrived in the ED, is alert and oriented x3.  She reports she recalls the fall.  Today she complains pain around her right knee, this is worse with movement.  Patient is ambulatory at home.  She denies any headache, chest pain, shortness of breath.  The history is provided by the patient, medical records and the EMS personnel.    Past Medical History:  Diagnosis Date  . AKI (acute kidney injury) (HCC) 04/2016  . Anemia   . Arthritis   . Chronic atrial fibrillation   . Chronic edema   . Chronic venous insufficiency   . CKD (chronic kidney disease), stage III (HCC)   . Coagulopathy (HCC)   . Diabetes mellitus    Type 2  . Diverticulosis   . Dyslipidemia   . GERD (gastroesophageal reflux disease)   . GI bleed 2015   a. lower GI from diverticular source 2015.  [name] Hx of transfusion of packed red blood cells   . Hypertension   . Morbid obesity (HCC)   . Obstructive sleep apnea    on CPAP  . Pulmonary hypertension (HCC)   . Pyelonephritis 04/2016  . Renal infarct (HCC)    a. 04/2016- adm with flank pain, ? pyelo vs renal infarct in setting of recent subtherapeutic INR.  [name] Sinus bradycardia   . Stroke [facility]) 2015     Patient Active Problem List   Diagnosis Date Noted  . Sepsis (HCC) 02/02/2019  . Foot pain 07/19/2018  . Warm antibody hemolytic anemia 11/24/2017  . Pre-op evaluation 09/24/2017  . Urinary frequency 09/06/2017  . Obstructive sleep apnea   . Long term (current) use of anticoagulants 12/25/2016  . Orthostatic hypotension 09/08/2016  . Chronic diastolic CHF (congestive heart failure) (HCC) 09/08/2016  . Sinus pause: 4.02-4.80sec per telemetry 09/08/2016 09/08/2016  . Tachy-brady syndrome (HCC) 09/08/2016  . Sinus bradycardia 07/08/2016  . CKD (chronic kidney disease) stage 3, GFR 30-59 ml/min 05/11/2016  . Anemia 05/01/2016  . Diabetes mellitus with complication (HCC)   . Physical deconditioning 12/14/2014  . Essential hypertension 05/18/2014  . HLD (hyperlipidemia) 05/18/2014  . Chronic venous insufficiency 05/11/2014  . Embolic stroke (HCC) 01/10/2014  . Right hemiparesis (HCC) 01/10/2014  . GERD (gastroesophageal reflux disease)   . Chronic pain syndrome 09/07/2013  . Painful total knee replacement (HCC) 08/01/2013  . Permanent atrial fibrillation (HCC) 05/22/2010    Past Surgical History:  Procedure Laterality Date  . CESAREAN SECTION    . COLONOSCOPY Left 01/24/2013   Procedure: COLONOSCOPY;  Surgeon: Willis Modena, MD;  Location: [facility] ENDOSCOPY;  Service: Endoscopy;  Laterality:  Left;  . ESOPHAGOGASTRODUODENOSCOPY Left 01/23/2013   Procedure: ESOPHAGOGASTRODUODENOSCOPY (EGD);  Surgeon: Willis Modena, MD;  Location: [facility] ENDOSCOPY;  Service: Endoscopy;  Laterality: Left;  . EYE SURGERY Bilateral    cataract surgery  . JOINT REPLACEMENT    . PACEMAKER IMPLANT [name] 09/09/2016   Procedure: Pacemaker Implant;  Surgeon: Marinus Maw, MD;  Location: [facility] INVASIVE CV LAB;  Service: Cardiovascular;  Laterality: [name];  . REPLACEMENT TOTAL KNEE BILATERAL    . TUBAL LIGATION       OB History   No obstetric history on file.      Home Medications    Prior to Admission  medications   Medication Sig Start Date End Date Taking? Authorizing Provider  diltiazem (CARTIA XT) 180 MG 24 hr capsule Take 1 capsule (180 mg total) by mouth daily. Needs appointment before next refill 08/18/18   [name], MD  ferrous sulfate 325 (65 FE) MG EC tablet TAKE ONE TABLET BY MOUTH TWICE DAILY 05/22/16   [name], MD  furosemide (LASIX) 40 MG tablet Take 1 tablet (40 mg total) by mouth 2 (two) times daily. Need appointment for further refills 09/16/18   [name], MD  gabapentin (NEURONTIN) 300 MG capsule Take 1 capsule (300 mg total) by mouth 3 (three) times daily. Need appointment before next refill 09/16/18   [name], MD  hydrochlorothiazide (HYDRODIURIL) 12.5 MG tablet  05/05/13   [provider]  HYDROmorphone (DILAUDID) 2 MG tablet  05/10/14   [provider]  lisinopril (PRINIVIL,ZESTRIL) 20 MG tablet  04/16/13   [provider]  lovastatin (MEVACOR) 20 MG tablet Take 1 tablet (20 mg total) by mouth at bedtime. Need appointment for further refills 09/16/18   [name], MD  meclizine (ANTIVERT) 25 MG tablet Take 1 tablet (25 mg total) by mouth every 4 (four) hours as needed for dizziness. 09/06/17   Corwin Levins, MD  metoprolol tartrate (LOPRESSOR) 50 MG tablet Take 1 tablet (50 mg total) by mouth 2 (two) times daily. Please call and schedule an appointment 09/16/18   [name], MD  potassium chloride SA (K-DUR) 20 MEQ tablet Take 2 tabs (40 meq) in the AM and 1 tab (20 meq) in the PM 09/20/18   [name], MD  predniSONE (DELTASONE) 5 MG tablet Take 1 tablet (5 mg total) by mouth daily with breakfast. 03/09/18   Myrtis Ser, NP  Respiratory Therapy Supplies MISC by Does not apply route.    [provider]  senna-docusate (SENOKOT-S) 8.6-50 MG tablet Take by mouth. 11/04/17   [provider]  sulfamethoxazole-trimethoprim (BACTRIM DS,SEPTRA DS) 800-160 MG  tablet Take 1 tablet by mouth 2 (two) times daily. 07/19/18   [name], MD  warfarin (COUMADIN) 5 MG tablet TAKE 1 TABLET BY MOUTH DAILY EXCEPT 1 & 1/2 TABLETS ON WEDNESDAY OR AS DIRECTED BY ANTICOAGULATION CLINIC 09/16/18   [name], MD    Family History Family History  Problem Relation Age of Onset  . Cancer Father   . Stroke Maternal Aunt     Social History Social History   Tobacco Use  . Smoking status: Former Smoker    Packs/day: 0.12    Years: 34.00    Pack years: 4.08    Types: Cigarettes    Quit date: 07/07/1993    Years since quitting: 25.5  . Smokeless tobacco: Never Used  Substance Use Topics  . Alcohol use: No  Comment: no longer drinks alcohol  . Drug use: No     Allergies   Aspirin, Rofecoxib, and Hydrocodone   Review of Systems Review of Systems  Constitutional: Positive for chills and fever.  HENT: Negative for sore throat.   Eyes: Negative for photophobia.  Respiratory: Negative for shortness of breath.   Cardiovascular: Negative for chest pain.  Gastrointestinal: Negative for abdominal pain, nausea and vomiting.  Genitourinary: Negative for flank pain.  Musculoskeletal: Positive for neck pain. Negative for back pain.  Skin: Negative for pallor and wound.  Neurological: Positive for weakness. Negative for light-headedness and headaches.     Physical Exam Updated Vital Signs BP 122/84   Pulse (!) 115   Temp (!) 101.5 F (38.6 C) (Rectal)   Resp (!) 24   Ht 5\' 7"  (1.702 m)   Wt 81.6 kg   SpO2 100%   BMI 28.19 kg/m   Physical Exam Vitals signs and nursing note reviewed.  Constitutional:      Appearance: She is ill-appearing.  HENT:     Head: Normocephalic.     Mouth/Throat:     Mouth: Mucous membranes are dry.     Pharynx: Posterior oropharyngeal erythema present.  Eyes:     Pupils: Pupils are equal, round, and reactive to light.  Neck:     Comments: Aspen c-collar in place. Cardiovascular:     Rate and  Rhythm: Tachycardia present. Rhythm irregular.  Pulmonary:     Breath sounds: No wheezing.  Abdominal:     General: Abdomen is flat. There is no distension.     Palpations: Abdomen is soft.     Tenderness: There is no abdominal tenderness.  Musculoskeletal:       Legs:  Feet:     Right foot:     Toenail Condition: Right toenails are abnormally thick and long.     Left foot:     Toenail Condition: Left toenails are abnormally thick and long.  Skin:    Comments: Bilateral skin appears scaling, they can.  No pitting edema.  Neurological:     Mental Status: She is alert.      ED Treatments / Results  Labs (all labs ordered are listed, but only abnormal results are displayed) Labs Reviewed  CBC WITH DIFFERENTIAL/PLATELET - Abnormal; Notable for the following components:      Result Value   WBC 21.8 (*)    Hemoglobin 11.1 (*)    HCT 33.1 (*)    RDW 15.9 (*)    Platelets 147 (*)    Neutro Abs 17.4 (*)    Monocytes Absolute 2.1 (*)    Abs Immature Granulocytes 0.52 (*)    All other components within normal limits  URINALYSIS, ROUTINE W REFLEX MICROSCOPIC - Abnormal; Notable for the following components:   Hgb urine dipstick SMALL (*)    Nitrite POSITIVE (*)    Leukocytes,Ua LARGE (*)    Bacteria, UA MANY (*)    All other components within normal limits  LACTIC ACID, PLASMA - Abnormal; Notable for the following components:   Lactic Acid, Venous 2.1 (*)    All other components within normal limits  COMPREHENSIVE METABOLIC PANEL - Abnormal; Notable for the following components:   Glucose, Bld 142 (*)    BUN 31 (*)    Creatinine, Ser 1.64 (*)    Albumin 2.8 (*)    Total Bilirubin 1.4 (*)    GFR calc non Af Amer 29 (*)    GFR calc Af  Amer 34 (*)    All other components within normal limits  CULTURE, BLOOD (ROUTINE X 2)  CULTURE, BLOOD (ROUTINE X 2)  URINE CULTURE  SARS CORONAVIRUS 2 (TAT 6-24 HRS)  LACTIC ACID, PLASMA  APTT  MAGNESIUM  PROTIME-INR  SEDIMENTATION  RATE  C-REACTIVE PROTEIN    EKG EKG Interpretation  Date/Time:  Thursday February 02 2019 19:22:56 EDT Ventricular Rate:  141 PR Interval:    QRS Duration: 88 QT Interval:  315 QTC Calculation: 445 R Axis:   -14 Text Interpretation: Atrial fibrillation Paired ventricular premature complexes Nonspecific T abnormalities, lateral leads No acute changes afib is new compared to last ekg Confirmed by Derwood Kaplan [phone]) on 02/02/2019 7:32:57 PM   Radiology Dg Knee 2 Views Right  Result Date: 02/02/2019 CLINICAL DATA:  Pain, concern for infection EXAM: RIGHT KNEE - 1-2 VIEW COMPARISON:  CT knee 06/09/2017 FINDINGS: Extensive circumferential soft tissue swelling with a more large effusion and destructive change in lucency of the bone surrounding cemented femoral stem of the patient's total knee arthroplasty. Some destructive changes are noted near the posterior aspect of the tibial hardware is well. Alignment of the arthroplasty components is maintained. Sclerotic changes of the patella are more pronounced than on comparison exam. IMPRESSION: 1. Findings suspicious for septic arthritis and osteomyelitic changes of the distal femur, proximal tibia and patella 2. Hardware remains normally aligned. 3. Extensive circumferential soft tissue swelling. Electronically Signed   By: Kreg Shropshire M.D.   On: 02/02/2019 21:25   Ct Head Wo Contrast  Result Date: 02/02/2019 CLINICAL DATA:  Fall altered mentation EXAM: CT HEAD WITHOUT CONTRAST CT CERVICAL SPINE WITHOUT CONTRAST TECHNIQUE: Multidetector CT imaging of the head and cervical spine was performed following the standard protocol without intravenous contrast. Multiplanar CT image reconstructions of the cervical spine were also generated. COMPARISON:  None. FINDINGS: CT HEAD FINDINGS Brain: No acute territorial infarction, hemorrhage or intracranial mass. Atrophy and moderate small vessel ischemic changes of the white matter. Nonenlarged ventricles. Age  indeterminate lacunar infarct in the right cerebellum. Vascular: No hyperdense vessels.  Carotid vascular calcification Skull: Normal. Negative for fracture or focal lesion. Sinuses/Orbits: No acute finding. Other: None CT CERVICAL SPINE FINDINGS Alignment: No subluxation.  Facet alignment within normal limits Skull base and vertebrae: No acute fracture. No primary bone lesion or focal pathologic process. Soft tissues and spinal canal: No prevertebral fluid or swelling. No visible canal hematoma. Disc levels: Moderate diffuse degenerative change throughout the cervical spine with multiple level disc space narrowing and osteophyte. Posterior disc osteophyte complex C5-C6 and C6-C7 with indentation of anterior thecal sac. Facet degenerative change at multiple levels. Upper chest: Negative. Other: None IMPRESSION: 1. Negative for acute intracranial hemorrhage or mass. Possible age indeterminate lacunar infarct right cerebellum. Atrophy and small vessel ischemic changes of the white matter 2. Diffuse degenerative changes of the cervical spine. No acute osseous abnormality Electronically Signed   By: Jasmine Pang M.D.   On: 02/02/2019 20:18   Ct Cervical Spine Wo Contrast  Result Date: 02/02/2019 CLINICAL DATA:  Fall altered mentation EXAM: CT HEAD WITHOUT CONTRAST CT CERVICAL SPINE WITHOUT CONTRAST TECHNIQUE: Multidetector CT imaging of the head and cervical spine was performed following the standard protocol without intravenous contrast. Multiplanar CT image reconstructions of the cervical spine were also generated. COMPARISON:  None. FINDINGS: CT HEAD FINDINGS Brain: No acute territorial infarction, hemorrhage or intracranial mass. Atrophy and moderate small vessel ischemic changes of the white matter. Nonenlarged ventricles. Age indeterminate lacunar infarct  in the right cerebellum. Vascular: No hyperdense vessels.  Carotid vascular calcification Skull: Normal. Negative for fracture or focal lesion.  Sinuses/Orbits: No acute finding. Other: None CT CERVICAL SPINE FINDINGS Alignment: No subluxation.  Facet alignment within normal limits Skull base and vertebrae: No acute fracture. No primary bone lesion or focal pathologic process. Soft tissues and spinal canal: No prevertebral fluid or swelling. No visible canal hematoma. Disc levels: Moderate diffuse degenerative change throughout the cervical spine with multiple level disc space narrowing and osteophyte. Posterior disc osteophyte complex C5-C6 and C6-C7 with indentation of anterior thecal sac. Facet degenerative change at multiple levels. Upper chest: Negative. Other: None IMPRESSION: 1. Negative for acute intracranial hemorrhage or mass. Possible age indeterminate lacunar infarct right cerebellum. Atrophy and small vessel ischemic changes of the white matter 2. Diffuse degenerative changes of the cervical spine. No acute osseous abnormality Electronically Signed   By: Donavan Foil M.D.   On: 02/02/2019 20:18   Dg Chest Port 1 View  Result Date: 02/02/2019 CLINICAL DATA:  Fever, pain, concern for infection EXAM: PORTABLE CHEST 1 VIEW COMPARISON:  Radiograph 02/22/2017 FINDINGS: Dual lead pacer battery pack overlies the left chest wall with leads directed towards the cardiac apex and right atrium. Spinal stimulator wires terminate in the midthoracic spine. Severe dextrocurvature of the spine is again noted. Degenerative changes are present in the imaged spine and shoulders. There are coarse interstitial opacities throughout the lungs with superimposed low volumes and basilar atelectasis. No consolidation, features of edema, pneumothorax, or effusion. The aorta is calcified. Mild cardiomegaly, similar to prior accounting for differences in technique. IMPRESSION: 1. Chronic coarse interstitial opacities throughout the lungs with superimposed low volumes and basilar atelectasis. 2. No consolidation. Electronically Signed   By: Lovena Le M.D.   On:  02/02/2019 21:22    Procedures .Critical Care Performed by: Janeece Fitting, PA-C Authorized by: Janeece Fitting, PA-C   Critical care provider statement:    Critical care time (minutes):  45   Critical care start time:  02/02/2019 9:00 PM   Critical care end time:  02/02/2019 9:45 PM   Critical care time was exclusive of:  Separately billable procedures and treating other patients   Critical care was necessary to treat or prevent imminent or life-threatening deterioration of the following conditions:  Sepsis   Critical care was time spent personally by me on the following activities:  Blood draw for specimens, development of treatment plan with patient or surrogate, discussions with consultants, evaluation of patient's response to treatment, examination of patient, obtaining history from patient or surrogate, ordering and performing treatments and interventions, ordering and review of laboratory studies, ordering and review of radiographic studies, pulse oximetry, re-evaluation of patient's condition and review of old charts   (including critical care time)  Medications Ordered in ED Medications  0.9 %  sodium chloride infusion (has no administration in time range)  vancomycin (VANCOCIN) IVPB 750 mg/150 ml premix (has no administration in time range)  ceFEPIme (MAXIPIME) 2 g in sodium chloride 0.9 % 100 mL IVPB (has no administration in time range)  ceFEPIme (MAXIPIME) 2 g in sodium chloride 0.9 % 100 mL IVPB (0 g Intravenous Stopped 02/02/19 2047)  metroNIDAZOLE (FLAGYL) IVPB 500 mg (0 mg Intravenous Stopped 02/02/19 2122)  sodium chloride 0.9 % bolus 500 mL (0 mLs Intravenous Stopped 02/02/19 2051)  acetaminophen (TYLENOL) tablet 1,000 mg (1,000 mg Oral Given 02/02/19 2022)  vancomycin (VANCOCIN) 1,750 mg in sodium chloride 0.9 % 500 mL IVPB (1,750 mg  Intravenous New Bag/Given 02/02/19 2050)     Initial Impression / Assessment and Plan / ED Course  I have reviewed the triage vital signs  and the nursing notes.  Pertinent labs & imaging results that were available during my care of the patient were reviewed by me and considered in my medical decision making (see chart for details).  Clinical Course as of Feb 01 2313  Thu Feb 02, 2019  2202 Lactic Acid, Venous(!!): 2.1 [JS]  2202 WBC(!): 21.8 [JS]  2202 Nitrite(!): POSITIVE [JS]  2202 Bacteria, UA(!): MANY [JS]  2205 Hgb urine dipstick(!): SMALL [JS]    Clinical Course User Index [JS] Claude MangesSoto, Liliyana Thobe, PA-C   Patient with a past medical history of A. fib currently on warfarin presents to the ED via EMS status post mechanical fall.  Patient was attempting to transfer from one chair to the other when she landed on her right knee, unknown whether she struck her head or not however was placed on a c-collar by EMS.  Patient was found to be in A. fib via EMS, she was given 20 mg of Cardizem, a Cardizem drip was also started but only 1 dose was given.  Patient does recall the incident but is unable to specify whether she struck her head.  She is currently on a blood thinner.  Also reports pain along her right knee, limited ROM.  Patient arrived in the ED hypertensive, tachycardic with a heart rate in the 150s, EKG showed irregularly irregular rhythm.  She was found to be febrile orally at 100.7, I rectal pressure was obtained by me, 1-1.5 temp on assessment.  Code sepsis was activated, patient was covered with broad source of antibiotics.  Will treat infection to determine whether this helps rate, she has already received 20 mg of Cardizem along with a Cardizem drip.  I have discussed patient with Dr. Rhunette CroftNanavati, he has also seen and evaluated patient and agrees with plan and management.  This patients CHA2DS2-VASc Score and unadjusted Ischemic Stroke Rate (% per year) is equal to 7.2 % stroke rate/year from a score of 5  Above score calculated as 1 point each if present [CHF, HTN, DM, Vascular=MI/PAD/Aortic Plaque, Age if 65-74, or  Female] Above score calculated as 2 points each if present [Age > 75, or Stroke/TIA/TE]  CBC with a white blood cell count of 21.8, hemoglobin is proved since previous visit.  CMP without any electrolyte derangement, creatinine level elevated but consistent with her previous visits.  Lactic acid is 2.1, PT and INR within normal limits.  UA does have some nitrites, leukocytes, many bacteria.  Suspect that patient is likely septic from a urinary tract infection, will admit patient for further management of her UTI.  She did have a fall CT head and neck showed: 1. Negative for acute intracranial hemorrhage or mass. Possible age  indeterminate lacunar infarct right cerebellum. Atrophy and small  vessel ischemic changes of the white matter  2. Diffuse degenerative changes of the cervical spine. No acute  osseous abnormality     She was removed of her c-collar by me x-ray of her right knee showed:  1. Findings suspicious for septic arthritis and osteomyelitic  changes of the distal femur, proximal tibia and patella  2. Hardware remains normally aligned.  3. Extensive circumferential soft tissue swelling.       Chest x-ray: 1. Chronic coarse interstitial opacities throughout the lungs with  superimposed low volumes and basilar atelectasis.  2. No consolidation.  Will add Sed rate, CRP to further evaluate septic arthritis. Will also place call for orthopedics on call for further recommendations.    10:37 PM Spoke to Dr. Jena Gauss of orthopedics who will see patient in the hospital tomorrow morning.  Hospitalist admission pending.  11:13 PM Spoke to hospitalist who will admit patient for further management.    Portions of this note were generated with Scientist, clinical (histocompatibility and immunogenetics). Dictation errors may occur despite best attempts at proofreading.  Final Clinical Impressions(s) / ED Diagnoses   Final diagnoses:  Acute cystitis with hematuria  Arthritis of right knee due to other bacteria  [facility])    ED Discharge Orders    None       Claude Manges, PA-C 02/02/19 2314    Derwood Kaplan, MD 02/02/19 [phone]

## 2019-02-03 ENCOUNTER — Encounter (HOSPITAL_COMMUNITY): Payer: Self-pay | Admitting: Internal Medicine

## 2019-02-03 DIAGNOSIS — E118 Type 2 diabetes mellitus with unspecified complications: Secondary | ICD-10-CM

## 2019-02-03 DIAGNOSIS — I4891 Unspecified atrial fibrillation: Secondary | ICD-10-CM | POA: Diagnosis present

## 2019-02-03 DIAGNOSIS — N3001 Acute cystitis with hematuria: Secondary | ICD-10-CM | POA: Insufficient documentation

## 2019-02-03 LAB — BLOOD CULTURE ID PANEL (REFLEXED)

## 2019-02-03 LAB — GLUCOSE, CAPILLARY
Glucose-Capillary: 113 mg/dL — ABNORMAL HIGH (ref 70–99)
Glucose-Capillary: 103 mg/dL — ABNORMAL HIGH (ref 70–99)
Glucose-Capillary: 97 mg/dL (ref 70–99)
Glucose-Capillary: 112 mg/dL — ABNORMAL HIGH (ref 70–99)

## 2019-02-03 LAB — BASIC METABOLIC PANEL
Anion gap: 12 (ref 5–15)
BUN: 30 mg/dL — ABNORMAL HIGH (ref 8–23)
CO2: 24 mmol/L (ref 22–32)
Calcium: 8.5 mg/dL — ABNORMAL LOW (ref 8.9–10.3)
Chloride: 105 mmol/L (ref 98–111)
Creatinine, Ser: 1.39 mg/dL — ABNORMAL HIGH (ref 0.44–1.00)
GFR calc Af Amer: 42 mL/min — ABNORMAL LOW (ref >60)
GFR calc non Af Amer: 36 mL/min — ABNORMAL LOW (ref >60)
Glucose, Bld: 121 mg/dL — ABNORMAL HIGH (ref 70–99)
Potassium: 5.8 mmol/L — ABNORMAL HIGH (ref 3.5–5.1)
Sodium: 141 mmol/L (ref 135–145)

## 2019-02-03 LAB — POTASSIUM: Potassium: 3.4 mmol/L — ABNORMAL LOW (ref 3.5–5.1)

## 2019-02-03 LAB — CBC
HCT: 35.7 % — ABNORMAL LOW (ref 36.0–46.0)
Hemoglobin: 11.5 g/dL — ABNORMAL LOW (ref 12.0–15.0)
MCH: 27.8 pg (ref 26.0–34.0)
MCHC: 32.2 g/dL (ref 30.0–36.0)
MCV: 86.4 fL (ref 80.0–100.0)
Platelets: UNDETERMINED 10*3/uL (ref 150–400)
RBC: 4.13 MIL/uL (ref 3.87–5.11)
RDW: 16.1 % — ABNORMAL HIGH (ref 11.5–15.5)
WBC: 20.5 10*3/uL — ABNORMAL HIGH (ref 4.0–10.5)
nRBC: 0 % (ref 0.0–0.2)

## 2019-02-03 LAB — MRSA PCR SCREENING: MRSA by PCR: NEGATIVE

## 2019-02-03 LAB — HEMOGLOBIN A1C
Hgb A1c MFr Bld: 5.8 % — ABNORMAL HIGH (ref 4.8–5.6)
Mean Plasma Glucose: 119.76 mg/dL

## 2019-02-03 LAB — SARS CORONAVIRUS 2 (TAT 6-24 HRS): SARS Coronavirus 2: NEGATIVE

## 2019-02-03 LAB — URINE CULTURE

## 2019-02-03 LAB — SEDIMENTATION RATE: Sed Rate: 78 mm/hr — ABNORMAL HIGH (ref 0–22)

## 2019-02-03 MED ORDER — METRONIDAZOLE IN NACL 5-0.79 MG/ML-% IV SOLN
500.0000 mg | Freq: Three times a day (TID) | INTRAVENOUS | Status: DC
  Administered 2019-02-03 – 2019-02-05 (×7): 500 mg via INTRAVENOUS
  Filled 2019-02-03 (×7): qty 100

## 2019-02-03 MED ORDER — ONDANSETRON HCL 4 MG/2ML IJ SOLN: 4.0000 mg | Freq: Four times a day (QID) | INTRAMUSCULAR | Status: DC | PRN

## 2019-02-03 MED ORDER — WARFARIN SODIUM 5 MG PO TABS
10.0000 mg | ORAL_TABLET | Freq: Once | ORAL | Status: AC
  Administered 2019-02-03: 10 mg via ORAL
  Filled 2019-02-03: qty 2

## 2019-02-03 MED ORDER — FLUMAZENIL 0.5 MG/5ML IV SOLN: 0.3000 mg | Freq: Once | INTRAVENOUS | Status: DC

## 2019-02-03 MED ORDER — PREDNISONE 5 MG PO TABS
5.0000 mg | ORAL_TABLET | Freq: Every day | ORAL | Status: DC
  Administered 2019-02-03 – 2019-02-08 (×6): 5 mg via ORAL
  Filled 2019-02-03 (×7): qty 1

## 2019-02-03 MED ORDER — GABAPENTIN 300 MG PO CAPS
300.0000 mg | ORAL_CAPSULE | Freq: Three times a day (TID) | ORAL | Status: DC
  Administered 2019-02-03 – 2019-02-08 (×18): 300 mg via ORAL
  Filled 2019-02-03 (×18): qty 1

## 2019-02-03 MED ORDER — SODIUM CHLORIDE 0.9 % IV SOLN
2.0000 g | INTRAVENOUS | Status: DC
  Administered 2019-02-03 – 2019-02-05 (×3): 2 g via INTRAVENOUS
  Filled 2019-02-03 (×4): qty 20

## 2019-02-03 MED ORDER — ONDANSETRON HCL 4 MG PO TABS: 4.0000 mg | ORAL_TABLET | Freq: Four times a day (QID) | ORAL | Status: DC | PRN

## 2019-02-03 MED ORDER — INSULIN ASPART 100 UNIT/ML ~~LOC~~ SOLN
0.0000 [IU] | Freq: Three times a day (TID) | SUBCUTANEOUS | Status: DC
  Administered 2019-02-04 – 2019-02-06 (×5): 1 [IU] via SUBCUTANEOUS

## 2019-02-03 MED ORDER — LISINOPRIL 20 MG PO TABS
20.0000 mg | ORAL_TABLET | Freq: Every day | ORAL | Status: DC
  Administered 2019-02-03 – 2019-02-07 (×5): 20 mg via ORAL
  Filled 2019-02-03 (×5): qty 1

## 2019-02-03 MED ORDER — ACETAMINOPHEN 325 MG PO TABS
650.0000 mg | ORAL_TABLET | Freq: Four times a day (QID) | ORAL | Status: DC | PRN
  Administered 2019-02-04 – 2019-02-08 (×7): 650 mg via ORAL
  Filled 2019-02-03 (×7): qty 2

## 2019-02-03 MED ORDER — WARFARIN - PHARMACIST DOSING INPATIENT
Freq: Every day | Status: DC
  Administered 2019-02-05 – 2019-02-08 (×3)

## 2019-02-03 MED ORDER — ACETAMINOPHEN 650 MG RE SUPP: 650.0000 mg | Freq: Four times a day (QID) | RECTAL | Status: DC | PRN

## 2019-02-03 MED ORDER — VANCOMYCIN HCL IN DEXTROSE 1-5 GM/200ML-% IV SOLN
1000.0000 mg | INTRAVENOUS | Status: DC
  Filled 2019-02-03: qty 200

## 2019-02-03 MED ORDER — HYDROCORTISONE NA SUCCINATE PF 100 MG IJ SOLR
50.0000 mg | Freq: Four times a day (QID) | INTRAMUSCULAR | Status: AC
  Administered 2019-02-03 (×2): 50 mg via INTRAVENOUS
  Filled 2019-02-03 (×2): qty 2

## 2019-02-03 MED ORDER — PRAVASTATIN SODIUM 10 MG PO TABS
20.0000 mg | ORAL_TABLET | Freq: Every day | ORAL | Status: DC
  Administered 2019-02-03 – 2019-02-08 (×6): 20 mg via ORAL
  Filled 2019-02-03 (×6): qty 2

## 2019-02-03 MED ORDER — FERROUS SULFATE 325 (65 FE) MG PO TABS
325.0000 mg | ORAL_TABLET | Freq: Two times a day (BID) | ORAL | Status: DC
  Administered 2019-02-03 – 2019-02-08 (×13): 325 mg via ORAL
  Filled 2019-02-03 (×13): qty 1

## 2019-02-03 MED ORDER — HEPARIN SODIUM (PORCINE) 5000 UNIT/ML IJ SOLN: 5000.0000 [IU] | Freq: Three times a day (TID) | INTRAMUSCULAR | Status: DC

## 2019-02-03 MED ORDER — DILTIAZEM HCL ER COATED BEADS 180 MG PO CP24
180.0000 mg | ORAL_CAPSULE | Freq: Every day | ORAL | Status: DC
  Administered 2019-02-03 – 2019-02-07 (×5): 180 mg via ORAL
  Filled 2019-02-03 (×5): qty 1

## 2019-02-03 MED ORDER — METOPROLOL TARTRATE 50 MG PO TABS
50.0000 mg | ORAL_TABLET | Freq: Two times a day (BID) | ORAL | Status: DC
  Administered 2019-02-03 – 2019-02-07 (×10): 50 mg via ORAL
  Filled 2019-02-03 (×8): qty 1
  Filled 2019-02-03: qty 2
  Filled 2019-02-03: qty 1

## 2019-02-03 NOTE — Progress Notes (Signed)
PHARMACY - PHYSICIAN COMMUNICATION CRITICAL VALUE ALERT - BLOOD CULTURE IDENTIFICATION (BCID)  Results for orders placed or performed during the hospital encounter of 02/02/19  Blood Culture ID Panel (Reflexed) (Collected: 02/02/2019  8:00 PM)  Result Value Ref Range   Enterococcus species NOT DETECTED NOT DETECTED   Listeria monocytogenes NOT DETECTED NOT DETECTED   Staphylococcus species NOT DETECTED NOT DETECTED   Staphylococcus aureus (BCID) NOT DETECTED NOT DETECTED   Streptococcus species NOT DETECTED NOT DETECTED   Streptococcus agalactiae NOT DETECTED NOT DETECTED   Streptococcus pneumoniae NOT DETECTED NOT DETECTED   Streptococcus pyogenes NOT DETECTED NOT DETECTED   Acinetobacter baumannii NOT DETECTED NOT DETECTED   Enterobacteriaceae species DETECTED (A) NOT DETECTED   Enterobacter cloacae complex NOT DETECTED NOT DETECTED   Escherichia coli DETECTED (A) NOT DETECTED   Klebsiella oxytoca NOT DETECTED NOT DETECTED   Klebsiella pneumoniae NOT DETECTED NOT DETECTED   Proteus species NOT DETECTED NOT DETECTED   Serratia marcescens NOT DETECTED NOT DETECTED   Carbapenem resistance NOT DETECTED NOT DETECTED   Haemophilus influenzae NOT DETECTED NOT DETECTED   Neisseria meningitidis NOT DETECTED NOT DETECTED   Pseudomonas aeruginosa NOT DETECTED NOT DETECTED   Candida albicans NOT DETECTED NOT DETECTED   Candida glabrata NOT DETECTED NOT DETECTED   Candida krusei NOT DETECTED NOT DETECTED   Candida parapsilosis NOT DETECTED NOT DETECTED   Candida tropicalis NOT DETECTED NOT DETECTED    Name of physician (or Provider) Contacted: [facility]  Changes to prescribed antibiotics required: Narrow antibiotics to ceftriaxone 2g q24 hours  Georgina Peer 02/03/2019  2:41 PM

## 2019-02-03 NOTE — Progress Notes (Signed)
Received report from [facility] in the ED.

## 2019-02-03 NOTE — Progress Notes (Signed)
NURSING PROGRESS NOTE  Robin Snow MRN: [id number] Attending Provider: Nevada Crane PCP: [name], MD Code status: Full  Allergies:  Allergies  Allergen Reactions  . Aspirin Hives and Swelling    Angioedema   . Rofecoxib Hives  . Hydrocodone Hives    Tolerates oxycodone and tramadol    Past Medical History:  Past Medical History:  Diagnosis Date  . AKI (acute kidney injury) ([place]) 04/2016  . Anemia   . Arthritis   . Chronic atrial fibrillation ([place])   . Chronic edema   . Chronic venous insufficiency   . CKD (chronic kidney disease), stage III   . Coagulopathy ([place])   . Diabetes mellitus    Type 2  . Diverticulosis   . Dyslipidemia   . GERD (gastroesophageal reflux disease)   . GI bleed 2015   a. lower GI from diverticular source 2015.  [name] Hx of transfusion of packed red blood cells   . Hypertension   . Morbid obesity ([place])   . Obstructive sleep apnea    on CPAP  . Pulmonary hypertension ([place])   . Pyelonephritis 04/2016  . Renal infarct ([place])    a. 04/2016- adm with flank pain, ? pyelo vs renal infarct in setting of recent subtherapeutic INR.  [name] Sinus bradycardia   . Stroke [facility]) 2015    Past Surgical History:  Past Surgical History:  Procedure Laterality Date  . CESAREAN SECTION    . COLONOSCOPY Left 01/24/2013   Procedure: COLONOSCOPY;  Surgeon: Arta Silence, MD;  Location: [facility] ENDOSCOPY;  Service: Endoscopy;  Laterality: Left;  . ESOPHAGOGASTRODUODENOSCOPY Left 01/23/2013   Procedure: ESOPHAGOGASTRODUODENOSCOPY (EGD);  Surgeon: Arta Silence, MD;  Location: [facility] ENDOSCOPY;  Service: Endoscopy;  Laterality: Left;  . EYE SURGERY Bilateral    cataract surgery  . JOINT REPLACEMENT    . PACEMAKER IMPLANT [name] 09/09/2016   Procedure: Pacemaker Implant;  Surgeon: Evans Lance, MD;  Location: [place] CV LAB;  Service: Cardiovascular;  Laterality: [name];  . REPLACEMENT TOTAL KNEE BILATERAL    . TUBAL LIGATION      Robin Snow is a 78 y.o.  female patient, arrived to floor in room [phone] via stretcher, transferred from ED. Patient alert and oriented to self and place, but not time and situation. No acute distress noted. Denies pain.  Vital signs: Oral temperature 98.9 F (37.2 C), Blood pressure 142/93, Pulse 95, RR 19, SpO2 98 % on room air.   Cardiac monitoring: Telemetry box 5W #13 in place.  IV access: Right AC and Left AC; IV's infusing and condition patent and no redness.  Skin: BLE scaly and dry; abrasion to right heel  Patient's ID armband verified with patient and in place. Fall risk assessed, SR up X2, patient able to verbalize understanding of risks associated with falls and to call nurse or staff to assist before getting out of bed. Patient to room and equipment. Call bell within reach.

## 2019-02-03 NOTE — Progress Notes (Signed)
Pharmacy Antibiotic Note  Robin Snow is a 78 y.o. female admitted on 02/02/2019 with AMS and sepsis, suspected urinary and possible R knee infection source.  Pharmacy has been consulted for vancomycin and cefepime dosing.   Scr trended down since admit after fluid administration. UOP not documented. Patient received vancomycin load of 1750mg  in ED. Will increase vancomycin dose and continue same cefepime dose.   Plan: Increase Vancomycin 1000 mg IV Q 24 hrs. Goal AUC 400-550. Expected AUC: 473.3 SCr used: 1.39 Cefepime IV 2g q12 hours Monitor renal function, WBC, temp, and clinical status Obtain vancomycin levels as needed  Height: 5\' 7"  (170.2 cm) Weight: 209 lb 7 oz (95 kg) IBW/kg (Calculated) : 61.6  Temp (24hrs), Avg:99.5 F (37.5 C), Min:98 F (36.7 C), Max:101.5 F (38.6 C)  Recent Labs  Lab 02/02/19 1923 02/02/19 1931 02/02/19 2215 02/03/19 0209  WBC 21.8*  --   --  20.5*  CREATININE  --  1.64*  --  1.39*  LATICACIDVEN  --  2.1* 1.6  --     Estimated Creatinine Clearance: 38.9 mL/min (A) (by C-G formula based on SCr of 1.39 mg/dL (H)).    Allergies  Allergen Reactions  . Aspirin Hives and Swelling    Angioedema   . Rofecoxib Hives  . Hydrocodone Hives    Tolerates oxycodone and tramadol    Antimicrobials this admission: 10/29 Metronidazole >>  10/29 Vancomycin >>  10/29 Cefepime >>  Dose adjustments this admission: 10/30 Vancomycin 750mg  Q24 hr  > 1000mg  Q24 hr   Microbiology results: 10/29 BCx: NG < 12h 10/29 UCx: pending   10/30 MRSA PCR: negative  Thank you for allowing pharmacy to be a part of this patient's care.  Benetta Spar, PharmD, BCPS, BCCP Clinical Pharmacist  Please check AMION for all [place] phone numbers After 10:00 PM, call [place] [phone]

## 2019-02-03 NOTE — H&P (Addendum)
History and Physical    Robin Snow:[id number] DOB: 05/03/1940 DOA: 02/02/2019  PCP: [name], MD  Patient coming from: Home.  Chief Complaint: Fall.  HPI: Robin Snow is a 78 y.o. female with history of atrial fibrillation, hypertension, diabetes mellitus, chronic kidney disease stage III had a fall at home when patient was trying to get out of the bed.  Patient states she did not hit her head or lose consciousness.  She fell onto her right knee.  Patient as per the daughter with whom I spoke has been having chronic issues with the right knee and swelling which patient cannot place weight on.  Given the fall and increasing pain in the right knee patient was brought to the ER.  ED Course: In the ER patient was febrile with temperature of 101.5 F lab work show WBC count of 21.8 hemoglobin 11.1 platelets 147 creatinine 1.6 lactic acid was 2.1 which improved to 1.6 CRP was 19.7 UA was consistent with UTI.  Right knee was showing features concerning for osteomyelitis and septic arthritis.  On-call orthopedic surgeon Dr. Jena Gauss has been consulted will be seeing patient in consult.  Chest x-ray was showing chronic changes.  Patient was empirically started on antibiotic for sepsis.  At presentation patient was in A. fib with RVR and rate improved with IV fluid bolus.  Review of Systems: As per HPI, rest all negative.   Past Medical History:  Diagnosis Date  . AKI (acute kidney injury) ([facility]) 04/2016  . Anemia   . Arthritis   . Chronic atrial fibrillation ([facility])   . Chronic edema   . Chronic venous insufficiency   . CKD (chronic kidney disease), stage III   . Coagulopathy ([facility])   . Diabetes mellitus    Type 2  . Diverticulosis   . Dyslipidemia   . GERD (gastroesophageal reflux disease)   . GI bleed 2015   a. lower GI from diverticular source 2015.  [name] Hx of transfusion of packed red blood cells   . Hypertension   . Morbid obesity (H[facility])   . Obstructive sleep  apnea    on CPAP  . Pulmonary hypertension (H[facility])   . Pyelonephritis 04/2016  . Renal infarct ([facility])    a. 04/2016- adm with flank pain, ? pyelo vs renal infarct in setting of recent subtherapeutic INR.  [name] Sinus bradycardia   . Stroke [facility]) 2015    Past Surgical History:  Procedure Laterality Date  . CESAREAN SECTION    . COLONOSCOPY Left 01/24/2013   Procedure: COLONOSCOPY;  Surgeon: Willis Modena, MD;  Location: [facility] ENDOSCOPY;  Service: Endoscopy;  Laterality: Left;  . ESOPHAGOGASTRODUODENOSCOPY Left 01/23/2013   Procedure: ESOPHAGOGASTRODUODENOSCOPY (EGD);  Surgeon: Willis Modena, MD;  Location: [facility] ENDOSCOPY;  Service: Endoscopy;  Laterality: Left;  . EYE SURGERY Bilateral    cataract surgery  . JOINT REPLACEMENT    . PACEMAKER IMPLANT [name] 09/09/2016   Procedure: Pacemaker Implant;  Surgeon: Marinus Maw, MD;  Location: [facility] INVASIVE CV LAB;  Service: Cardiovascular;  Laterality: [name];  . REPLACEMENT TOTAL KNEE BILATERAL    . TUBAL LIGATION       reports that she quit smoking about 25 years ago. Her smoking use included cigarettes. She has a 4.08 pack-year smoking history. She has never used smokeless tobacco. She reports that she does not drink alcohol or use drugs.  Allergies  Allergen Reactions  . Aspirin Hives  . Rofecoxib Hives  . Hydrocodone Hives  Tolerates oxycodone and tramadol    Family History  Problem Relation Age of Onset  . Cancer Father   . Stroke Maternal Aunt     Prior to Admission medications   Medication Sig Start Date End Date Taking? Authorizing Provider  diltiazem (CARTIA XT) 180 MG 24 hr capsule Take 1 capsule (180 mg total) by mouth daily. Needs appointment before next refill 08/18/18   Myrlene Brokerrawford, Elizabeth A, MD  ferrous sulfate 325 (65 FE) MG EC tablet TAKE ONE TABLET BY MOUTH TWICE DAILY 05/22/16   Myrlene Brokerrawford, Elizabeth A, MD  furosemide (LASIX) 40 MG tablet Take 1 tablet (40 mg total) by mouth 2 (two) times daily. Need appointment for further  refills 09/16/18   Myrlene Brokerrawford, Elizabeth A, MD  gabapentin (NEURONTIN) 300 MG capsule Take 1 capsule (300 mg total) by mouth 3 (three) times daily. Need appointment before next refill 09/16/18   Myrlene Brokerrawford, Elizabeth A, MD  hydrochlorothiazide (HYDRODIURIL) 12.5 MG tablet  05/05/13   [provider]  HYDROmorphone (DILAUDID) 2 MG tablet  05/10/14   [provider]  lisinopril (PRINIVIL,ZESTRIL) 20 MG tablet  04/16/13   [provider]  lovastatin (MEVACOR) 20 MG tablet Take 1 tablet (20 mg total) by mouth at bedtime. Need appointment for further refills 09/16/18   Myrlene Brokerrawford, Elizabeth A, MD  meclizine (ANTIVERT) 25 MG tablet Take 1 tablet (25 mg total) by mouth every 4 (four) hours as needed for dizziness. 09/06/17   Corwin LevinsJohn, James W, MD  metoprolol tartrate (LOPRESSOR) 50 MG tablet Take 1 tablet (50 mg total) by mouth 2 (two) times daily. Please call and schedule an appointment 09/16/18   Myrlene Brokerrawford, Elizabeth A, MD  potassium chloride SA (K-DUR) 20 MEQ tablet Take 2 tabs (40 meq) in the AM and 1 tab (20 meq) in the PM 09/20/18   Myrlene Brokerrawford, Elizabeth A, MD  predniSONE (DELTASONE) 5 MG tablet Take 1 tablet (5 mg total) by mouth daily with breakfast. 03/09/18   Myrtis Serurcio, Kristin R, NP  Respiratory Therapy Supplies MISC by Does not apply route.    [provider]  senna-docusate (SENOKOT-S) 8.6-50 MG tablet Take by mouth. 11/04/17   [provider]  sulfamethoxazole-trimethoprim (BACTRIM DS,SEPTRA DS) 800-160 MG tablet Take 1 tablet by mouth 2 (two) times daily. 07/19/18   Myrlene Brokerrawford, Elizabeth A, MD  warfarin (COUMADIN) 5 MG tablet TAKE 1 TABLET BY MOUTH DAILY EXCEPT 1 & 1/2 TABLETS ON WEDNESDAY OR AS DIRECTED BY ANTICOAGULATION CLINIC 09/16/18   Myrlene Brokerrawford, Elizabeth A, MD    Physical Exam: Constitutional: Moderately built and nourished. Vitals:   02/02/19 2315 02/02/19 2328 02/02/19 2330 02/02/19 2345  BP: (!) 138/98 (!) 138/98 (!) 148/130 (!) 126/93  Pulse:  (!) 105    Resp: 20 16  10  (!) 23  Temp:  98 F (36.7 C)    TempSrc:  Oral    SpO2: 99% 100%  98%  Weight:      Height:       Eyes: Anicteric no pallor. ENMT: No discharge from the ears eyes nose or mouth. Neck: No mass or.  No neck rigidity. Respiratory: No rhonchi or crepitations. Cardiovascular: S1-S2 heard. Abdomen: Soft nontender bowel sounds present. Musculoskeletal: Right knee swelling but no warmth. Skin: Chronic skin changes. Neurologic: Alert awake oriented to time place and person.  Moves all extremities. Psychiatric: Appears normal per normal affect.   Labs on Admission: I have personally reviewed following labs and imaging studies  CBC: Recent Labs  Lab 02/02/19 1923  WBC 21.8*  NEUTROABS 17.4*  HGB 11.1*  HCT 33.1*  MCV 84.0  PLT 147*   Basic Metabolic Panel: Recent Labs  Lab 02/02/19 1931  NA 142  K 3.7  CL 105  CO2 25  GLUCOSE 142*  BUN 31*  CREATININE 1.64*  CALCIUM 8.9  MG 2.1   GFR: Estimated Creatinine Clearance: 30.6 mL/min (A) (by C-G formula based on SCr of 1.64 mg/dL (H)). Liver Function Tests: Recent Labs  Lab 02/02/19 1931  AST 28  ALT 16  ALKPHOS 91  BILITOT 1.4*  PROT 7.6  ALBUMIN 2.8*   No results for input(s): LIPASE, AMYLASE in the last 168 hours. No results for input(s): AMMONIA in the last 168 hours. Coagulation Profile: Recent Labs  Lab 02/02/19 1931  INR 1.2   Cardiac Enzymes: No results for input(s): CKTOTAL, CKMB, CKMBINDEX, TROPONINI in the last 168 hours. BNP (last 3 results) No results for input(s): PROBNP in the last 8760 hours. HbA1C: No results for input(s): HGBA1C in the last 72 hours. CBG: No results for input(s): GLUCAP in the last 168 hours. Lipid Profile: No results for input(s): CHOL, HDL, LDLCALC, TRIG, CHOLHDL, LDLDIRECT in the last 72 hours. Thyroid Function Tests: No results for input(s): TSH, T4TOTAL, FREET4, T3FREE, THYROIDAB in the last 72 hours. Anemia Panel: No results for input(s): VITAMINB12, FOLATE,  FERRITIN, TIBC, IRON, RETICCTPCT in the last 72 hours. Urine analysis:    Component Value Date/Time   COLORURINE YELLOW 02/02/2019 1923   APPEARANCEUR CLEAR 02/02/2019 1923   LABSPEC 1.013 02/02/2019 1923   PHURINE 5.0 02/02/2019 1923   GLUCOSEU NEGATIVE 02/02/2019 1923   GLUCOSEU NEGATIVE 09/06/2017 1200   HGBUR SMALL (A) 02/02/2019 1923   BILIRUBINUR NEGATIVE 02/02/2019 1923   BILIRUBINUR Neg 10/18/2017 1453   KETONESUR NEGATIVE 02/02/2019 1923   PROTEINUR NEGATIVE 02/02/2019 1923   UROBILINOGEN 0.2 10/18/2017 1453   UROBILINOGEN 1.0 09/06/2017 1200   NITRITE POSITIVE (A) 02/02/2019 1923   LEUKOCYTESUR LARGE (A) 02/02/2019 1923   Sepsis Labs: @LABRCNTIP (procalcitonin:4,lacticidven:4) )No results found for this or any previous visit (from the past 240 hour(s)).   Radiological Exams on Admission: Dg Knee 2 Views Right  Result Date: 02/02/2019 CLINICAL DATA:  Pain, concern for infection EXAM: RIGHT KNEE - 1-2 VIEW COMPARISON:  CT knee 06/09/2017 FINDINGS: Extensive circumferential soft tissue swelling with a more large effusion and destructive change in lucency of the bone surrounding cemented femoral stem of the patient's total knee arthroplasty. Some destructive changes are noted near the posterior aspect of the tibial hardware is well. Alignment of the arthroplasty components is maintained. Sclerotic changes of the patella are more pronounced than on comparison exam. IMPRESSION: 1. Findings suspicious for septic arthritis and osteomyelitic changes of the distal femur, proximal tibia and patella 2. Hardware remains normally aligned. 3. Extensive circumferential soft tissue swelling. Electronically Signed   By: 08/09/2017 M.D.   On: 02/02/2019 21:25   Ct Head Wo Contrast  Result Date: 02/02/2019 CLINICAL DATA:  Fall altered mentation EXAM: CT HEAD WITHOUT CONTRAST CT CERVICAL SPINE WITHOUT CONTRAST TECHNIQUE: Multidetector CT imaging of the head and cervical spine was performed  following the standard protocol without intravenous contrast. Multiplanar CT image reconstructions of the cervical spine were also generated. COMPARISON:  None. FINDINGS: CT HEAD FINDINGS Brain: No acute territorial infarction, hemorrhage or intracranial mass. Atrophy and moderate small vessel ischemic changes of the white matter. Nonenlarged ventricles. Age indeterminate lacunar infarct in the right cerebellum. Vascular: No hyperdense vessels.  Carotid vascular calcification Skull: Normal.  Negative for fracture or focal lesion. Sinuses/Orbits: No acute finding. Other: None CT CERVICAL SPINE FINDINGS Alignment: No subluxation.  Facet alignment within normal limits Skull base and vertebrae: No acute fracture. No primary bone lesion or focal pathologic process. Soft tissues and spinal canal: No prevertebral fluid or swelling. No visible canal hematoma. Disc levels: Moderate diffuse degenerative change throughout the cervical spine with multiple level disc space narrowing and osteophyte. Posterior disc osteophyte complex C5-C6 and C6-C7 with indentation of anterior thecal sac. Facet degenerative change at multiple levels. Upper chest: Negative. Other: None IMPRESSION: 1. Negative for acute intracranial hemorrhage or mass. Possible age indeterminate lacunar infarct right cerebellum. Atrophy and small vessel ischemic changes of the white matter 2. Diffuse degenerative changes of the cervical spine. No acute osseous abnormality Electronically Signed   By: Donavan Foil M.D.   On: 02/02/2019 20:18   Ct Cervical Spine Wo Contrast  Result Date: 02/02/2019 CLINICAL DATA:  Fall altered mentation EXAM: CT HEAD WITHOUT CONTRAST CT CERVICAL SPINE WITHOUT CONTRAST TECHNIQUE: Multidetector CT imaging of the head and cervical spine was performed following the standard protocol without intravenous contrast. Multiplanar CT image reconstructions of the cervical spine were also generated. COMPARISON:  None. FINDINGS: CT HEAD  FINDINGS Brain: No acute territorial infarction, hemorrhage or intracranial mass. Atrophy and moderate small vessel ischemic changes of the white matter. Nonenlarged ventricles. Age indeterminate lacunar infarct in the right cerebellum. Vascular: No hyperdense vessels.  Carotid vascular calcification Skull: Normal. Negative for fracture or focal lesion. Sinuses/Orbits: No acute finding. Other: None CT CERVICAL SPINE FINDINGS Alignment: No subluxation.  Facet alignment within normal limits Skull base and vertebrae: No acute fracture. No primary bone lesion or focal pathologic process. Soft tissues and spinal canal: No prevertebral fluid or swelling. No visible canal hematoma. Disc levels: Moderate diffuse degenerative change throughout the cervical spine with multiple level disc space narrowing and osteophyte. Posterior disc osteophyte complex C5-C6 and C6-C7 with indentation of anterior thecal sac. Facet degenerative change at multiple levels. Upper chest: Negative. Other: None IMPRESSION: 1. Negative for acute intracranial hemorrhage or mass. Possible age indeterminate lacunar infarct right cerebellum. Atrophy and small vessel ischemic changes of the white matter 2. Diffuse degenerative changes of the cervical spine. No acute osseous abnormality Electronically Signed   By: Donavan Foil M.D.   On: 02/02/2019 20:18   Dg Chest Port 1 View  Result Date: 02/02/2019 CLINICAL DATA:  Fever, pain, concern for infection EXAM: PORTABLE CHEST 1 VIEW COMPARISON:  Radiograph 02/22/2017 FINDINGS: Dual lead pacer battery pack overlies the left chest wall with leads directed towards the cardiac apex and right atrium. Spinal stimulator wires terminate in the midthoracic spine. Severe dextrocurvature of the spine is again noted. Degenerative changes are present in the imaged spine and shoulders. There are coarse interstitial opacities throughout the lungs with superimposed low volumes and basilar atelectasis. No consolidation,  features of edema, pneumothorax, or effusion. The aorta is calcified. Mild cardiomegaly, similar to prior accounting for differences in technique. IMPRESSION: 1. Chronic coarse interstitial opacities throughout the lungs with superimposed low volumes and basilar atelectasis. 2. No consolidation. Electronically Signed   By: Lovena Le M.D.   On: 02/02/2019 21:22    EKG: Independently reviewed.  A. fib with RVR.  Assessment/Plan Principal Problem:   Sepsis ([place]) Active Problems:   Essential hypertension   Diabetes mellitus with complication (HCC)   CKD (chronic kidney disease) stage 3, GFR 30-59 ml/min   Chronic diastolic CHF (congestive heart failure) ([place])  Warm antibody hemolytic anemia   Atrial fibrillation with RVR (HCC)    1. Sepsis source could be UTI but there is also concerning features for right knee.  Dr. Jena Gauss of orthopedic surgeon has been consulted.  Follow cultures continue IV fluids.  Follow lactic acid levels. 2. A. fib with RVR improved with IV fluids.  On beta-blockers Cardizem.  Patient's INR is subtherapeutic.  Holding off anticoagulation for now until orthopedic surgery sees and if no procedure anticipated start anticoagulation again. 3. Diabetes mellitus type 2 we will keep patient on sliding scale coverage. 4. Hypertension on beta-blockers Cardizem and lisinopril.  Note that patient has chronic kidney disease.  Holding hydrochlorothiazide since patient is receiving fluids. 5. Chronic kidney disease stage III creatinine appears to be at baseline.  Note that patient is on lisinopril. 6. Anemia and thrombocytopenia with history of autoimmune hemolytic anemia follow CBC closely.  Patient is on prednisone (confirmed with patient's daughter).  Will give stress dose steroids. 7. Hyperlipidemia on statins.  Given the septic nature of patient's presentation will need more than 2 midnight stay. Holding off patient's Lasix for now.  Patient's daughter will bring all  medication list in the morning.   DVT prophylaxis: For now holding anticoagulation and if orthopedics is not planning any procedure restart anticoagulation. Code Status: Full code. Family Communication: Patient's daughter. Disposition Plan: To be determined. Consults called: Orthopedics. Admission status: Inpatient.   Eduard Clos MD [facility] Pager [phone].  If 7PM-7AM, please contact night-coverage [email or web address] Password [email or web address]  02/03/2019, 12:42 AM

## 2019-02-03 NOTE — Consult Note (Signed)
Reason for Consult:Right knee pain Referring Physician: C Iona HansenHall  Robin Snow is an 78 y.o. female.  HPI: Robin Snow was admitted yesterday with sepsis. She said she'd been having chronic knee pain and x-rays were concerning for septic joint and orthopedic surgery was consulted. She is somewhat delusional this morning with significant confabulation; I'm not sure what her baseline is.  Past Medical History:  Diagnosis Date  . AKI (acute kidney injury) (HCC) 04/2016  . Anemia   . Arthritis   . Chronic atrial fibrillation (HCC)   . Chronic edema   . Chronic venous insufficiency   . CKD (chronic kidney disease), stage III   . Coagulopathy (HCC)   . Diabetes mellitus    Type 2  . Diverticulosis   . Dyslipidemia   . GERD (gastroesophageal reflux disease)   . GI bleed 2015   a. lower GI from diverticular source 2015.  Robin Snow. Hx of transfusion of packed red blood cells   . Hypertension   . Morbid obesity (HCC)   . Obstructive sleep apnea    on CPAP  . Pulmonary hypertension (HCC)   . Pyelonephritis 04/2016  . Renal infarct (HCC)    a. 04/2016- adm with flank pain, ? pyelo vs renal infarct in setting of recent subtherapeutic INR.  Robin Snow. Sinus bradycardia   . Stroke [facility](HCC) 2015    Past Surgical History:  Procedure Laterality Date  . CESAREAN SECTION    . COLONOSCOPY Left 01/24/2013   Procedure: COLONOSCOPY;  Surgeon: Willis ModenaWilliam Outlaw, MD;  [facility];  Service: Endoscopy;  Laterality: Left;  . ESOPHAGOGASTRODUODENOSCOPY Left 01/23/2013   Procedure: ESOPHAGOGASTRODUODENOSCOPY (EGD);  Surgeon: Willis ModenaWilliam Outlaw, MD;  [facility];  Service: Endoscopy;  Laterality: Left;  . EYE SURGERY Bilateral    cataract surgery  . JOINT REPLACEMENT    . PACEMAKER IMPLANT [name] 09/09/2016   Procedure: Pacemaker Implant;  Surgeon: Marinus Mawaylor, Gregg W, MD;  [facility];  Service: Cardiovascular;  Laterality: [name];  . REPLACEMENT TOTAL KNEE BILATERAL    . TUBAL LIGATION       Family History  Problem Relation Age of Onset  . Cancer Father   . Stroke Maternal Aunt     Social History:  reports that she quit smoking about 25 years ago. Her smoking use included cigarettes. She has a 4.08 pack-year smoking history. She has never used smokeless tobacco. She reports that she does not drink alcohol or use drugs.  Allergies:  Allergies  Allergen Reactions  . Aspirin Hives and Swelling    Angioedema   . Rofecoxib Hives  . Hydrocodone Hives    Tolerates oxycodone and tramadol    Medications: I have reviewed the patient's current medications.  Results for orders placed or performed during the hospital encounter of 02/02/19 (from the past 48 hour(s))  CBC with Differential     Status: Abnormal   Collection Time: 02/02/19  7:23 PM  Result Value Ref Range   WBC 21.8 (H) 4.0 - 10.5 K/uL   RBC 3.94 3.87 - 5.11 MIL/uL   Hemoglobin 11.1 (L) 12.0 - 15.0 g/dL   HCT 82.933.1 (L) 56.236.0 - 13.046.0 %   MCV 84.0 80.0 - 100.0 fL   MCH 28.2 26.0 - 34.0 pg   MCHC 33.5 30.0 - 36.0 g/dL   RDW 86.515.9 (H) 78.411.5 - 69.615.5 %   Platelets 147 (L) 150 - 400 K/uL    Comment: REPEATED TO VERIFY   nRBC 0.1 0.0 - 0.2 %  Neutrophils Relative % 81 %   Neutro Abs 17.4 (H) 1.7 - 7.7 K/uL   Lymphocytes Relative 8 %   Lymphs Abs 1.7 0.7 - 4.0 K/uL   Monocytes Relative 9 %   Monocytes Absolute 2.1 (H) 0.1 - 1.0 K/uL   Eosinophils Relative 0 %   Eosinophils Absolute 0.0 0.0 - 0.5 K/uL   Basophils Relative 0 %   Basophils Absolute 0.1 0.0 - 0.1 K/uL   Immature Granulocytes 2 %   Abs Immature Granulocytes 0.52 (H) 0.00 - 0.07 K/uL    Comment: Performed at [facility], [address] [place], [place] [address]  Urinalysis, Routine w reflex microscopic     Status: Abnormal   Collection Time: 02/02/19  7:23 PM  Result Value Ref Range   Color, Urine YELLOW YELLOW   APPearance CLEAR CLEAR   Specific Gravity, Urine 1.013 1.005 - 1.030   pH 5.0 5.0 - 8.0   Glucose, UA NEGATIVE NEGATIVE mg/dL    Hgb urine dipstick SMALL (A) NEGATIVE   Bilirubin Urine NEGATIVE NEGATIVE   Ketones, ur NEGATIVE NEGATIVE mg/dL   Protein, ur NEGATIVE NEGATIVE mg/dL   Nitrite POSITIVE (A) NEGATIVE   Leukocytes,Ua LARGE (A) NEGATIVE   RBC / HPF 0-5 0 - 5 RBC/hpf   WBC, UA 21-50 0 - 5 WBC/hpf   Bacteria, UA MANY (A) NONE SEEN   Squamous Epithelial / LPF 0-5 0 - 5    Comment: Performed at [facility], [address], [place], [place] [address]  Lactic acid, plasma     Status: Abnormal   Collection Time: 02/02/19  7:31 PM  Result Value Ref Range   Lactic Acid, Venous 2.1 (HH) 0.5 - 1.9 mmol/L    Comment: CRITICAL RESULT CALLED TO, READ BACK BY AND VERIFIED WITH: RN L CHITON  02/02/2019 BY S GEZAHEGN Performed at [facility], [address] [place], [place] [address]   Comprehensive metabolic panel     Status: Abnormal   Collection Time: 02/02/19  7:31 PM  Result Value Ref Range   Sodium 142 135 - 145 mmol/L   Potassium 3.7 3.5 - 5.1 mmol/L   Chloride 105 98 - 111 mmol/L   CO2 25 22 - 32 mmol/L   Glucose, Bld 142 (H) 70 - 99 mg/dL   BUN 31 (H) 8 - 23 mg/dL   Creatinine, Ser 9.14 (H) 0.44 - 1.00 mg/dL   Calcium 8.9 8.9 - 78.2 mg/dL   Total Protein 7.6 6.5 - 8.1 g/dL   Albumin 2.8 (L) 3.5 - 5.0 g/dL   AST 28 15 - 41 U/L   ALT 16 0 - 44 U/L   Alkaline Phosphatase 91 38 - 126 U/L   Total Bilirubin 1.4 (H) 0.3 - 1.2 mg/dL   GFR calc non Af Amer 29 (L) >60 mL/min   GFR calc Af Amer 34 (L) >60 mL/min   Anion gap 12 5 - 15    Comment: Performed at [facility], [address], [place], [place] [address]  APTT     Status: None   Collection Time: 02/02/19  7:31 PM  Result Value Ref Range   aPTT 28 24 - 36 seconds    Comment: Performed at [facility], [address], [place], [place] [address]  Magnesium     Status: None   Collection Time: 02/02/19  7:31 PM  Result Value Ref Range   Magnesium 2.1 1.7 - 2.4 mg/dL  Comment: Performed at [facility],  [address], [place], [place] [address]  Protime-INR     Status: None   Collection Time: 02/02/19  7:31 PM  Result Value Ref Range   Prothrombin Time 15.2 11.4 - 15.2 seconds   INR 1.2 0.8 - 1.2    Comment: (NOTE) INR goal varies based on device and disease states. Performed at [facility], [address], [place], [place] [address]   Blood culture (routine x 2)     Status: None (Preliminary result)   Collection Time: 02/02/19  8:00 PM   Specimen: BLOOD RIGHT ARM  Result Value Ref Range   Specimen Description BLOOD RIGHT ARM    Special Requests      BOTTLES DRAWN AEROBIC AND ANAEROBIC Blood Culture adequate volume   Culture      NO GROWTH < 12 HOURS Performed at [facility], [address] [place], [place] [address]    Report Status PENDING   Blood culture (routine x 2)     Status: None (Preliminary result)   Collection Time: 02/02/19  8:05 PM   Specimen: BLOOD LEFT ARM  Result Value Ref Range   Specimen Description BLOOD LEFT ARM    Special Requests      BOTTLES DRAWN AEROBIC AND ANAEROBIC Blood Culture adequate volume   Culture      NO GROWTH < 12 HOURS Performed at [facility], [address] [place], [place] [address]    Report Status PENDING   SARS CORONAVIRUS 2 (TAT 6-24 HRS) Nasopharyngeal Nasopharyngeal Swab     Status: None   Collection Time: 02/02/19 10:03 PM   Specimen: Nasopharyngeal Swab  Result Value Ref Range   SARS Coronavirus 2 NEGATIVE NEGATIVE    Comment: (NOTE) SARS-CoV-2 target nucleic acids are NOT DETECTED. The SARS-CoV-2 RNA is generally detectable in upper and lower respiratory specimens during the acute phase of infection. Negative results do not preclude SARS-CoV-2 infection, do not rule out co-infections with other pathogens, and should not be used as the sole basis for treatment or other patient management decisions. Negative results must be combined with clinical observations, patient history, and epidemiological  information. The expected result is Negative. Fact Sheet for Patients: HairSlick.no Fact Sheet for Healthcare Providers: quierodirigir.com This test is not yet approved or cleared by the [place] FDA and  has been authorized for detection and/or diagnosis of SARS-CoV-2 by FDA under an Emergency Use Authorization (EUA). This EUA will remain  in effect (meaning this test can be used) for the duration of the COVID-19 declaration under Section 56 4(b)(1) of the Act, 21 U.S.C. section 360bbb-3(b)(1), unless the authorization is terminated or revoked sooner. Performed at [facility], [address], [place], [place] [address]   Lactic acid, plasma     Status: None   Collection Time: 02/02/19 10:15 PM  Result Value Ref Range   Lactic Acid, Venous 1.6 0.5 - 1.9 mmol/L    Comment: Performed at [facility], [address], [place], [place] [address]  Sedimentation rate     Status: Abnormal   Collection Time: 02/02/19 10:40 PM  Result Value Ref Range   Sed Rate 78 (H) 0 - 22 mm/hr    Comment: Performed at [facility], [address], [place], [place] [address]  C-reactive protein     Status: Abnormal   Collection Time: 02/02/19 10:40 PM  Result Value Ref Range  CRP 19.7 (H) <1.0 mg/dL    Comment: Performed at [place] [address] [place], [place] [address]  Basic metabolic panel     Status: Abnormal   Collection Time: 02/03/19  2:09 AM  Result Value Ref Range   Sodium 141 135 - 145 mmol/L   Potassium 5.8 (H) 3.5 - 5.1 mmol/L    Comment: SPECIMEN HEMOLYZED. HEMOLYSIS MAY AFFECT INTEGRITY OF RESULTS.   Chloride 105 98 - 111 mmol/L   CO2 24 22 - 32 mmol/L   Glucose, Bld 121 (H) 70 - 99 mg/dL   BUN 30 (H) 8 - 23 mg/dL   Creatinine, Ser 1.39 (H) 0.44 - 1.00 mg/dL   Calcium 8.5 (L) 8.9 - 10.3 mg/dL   GFR calc non Af Amer 36 (L) >60 mL/min   GFR calc Af Amer 42 (L) >60 mL/min   Anion gap 12 5 -  15    Comment: Performed at [place] [address] [place], [place] [address]  CBC     Status: Abnormal   Collection Time: 02/03/19  2:09 AM  Result Value Ref Range   WBC 20.5 (H) 4.0 - 10.5 K/uL   RBC 4.13 3.87 - 5.11 MIL/uL   Hemoglobin 11.5 (L) 12.0 - 15.0 g/dL   HCT 35.7 (L) 36.0 - 46.0 %   MCV 86.4 80.0 - 100.0 fL   MCH 27.8 26.0 - 34.0 pg   MCHC 32.2 30.0 - 36.0 g/dL   RDW 16.1 (H) 11.5 - 15.5 %   Platelets PLATELET CLUMPS NOTED ON SMEAR, UNABLE TO ESTIMATE 150 - 400 K/uL    Comment: Immature Platelet Fraction may be clinically indicated, consider ordering this additional test [id number]    nRBC 0.0 0.0 - 0.2 %    Comment: Performed at [facility], [place] [address], [place], [place] [address]  MRSA PCR Screening     Status: None   Collection Time: 02/03/19  5:28 AM   Specimen: Nasal Mucosa; Nasopharyngeal  Result Value Ref Range   MRSA by PCR NEGATIVE NEGATIVE    Comment:        The GeneXpert MRSA Assay (FDA approved for NASAL specimens only), is one component of a comprehensive MRSA colonization surveillance program. It is not intended to diagnose MRSA infection nor to guide or monitor treatment for MRSA infections. Performed at [facility], [place] [address], [place], [place] [address]   Glucose, capillary     Status: Abnormal   Collection Time: 02/03/19  5:41 AM  Result Value Ref Range   Glucose-Capillary 113 (H) 70 - 99 mg/dL  Glucose, capillary     Status: Abnormal   Collection Time: 02/03/19  8:01 AM  Result Value Ref Range   Glucose-Capillary 103 (H) 70 - 99 mg/dL    Dg Knee 2 Views Right  Result Date: 02/02/2019 CLINICAL DATA:  Pain, concern for infection EXAM: RIGHT KNEE - 1-2 VIEW COMPARISON:  CT knee 06/09/2017 FINDINGS: Extensive circumferential soft tissue swelling with a more large effusion and destructive change in lucency of the bone surrounding cemented femoral stem of the patient's total knee arthroplasty. Some destructive  changes are noted near the posterior aspect of the tibial hardware is well. Alignment of the arthroplasty components is maintained. Sclerotic changes of the patella are more pronounced than on comparison exam. IMPRESSION: 1. Findings suspicious for septic arthritis and osteomyelitic changes of the distal femur, proximal tibia and patella 2. Hardware remains normally aligned. 3.  Extensive circumferential soft tissue swelling. Electronically Signed   By: Kreg Shropshire M.D.   On: 02/02/2019 21:25   Ct Head Wo Contrast  Result Date: 02/02/2019 CLINICAL DATA:  Fall altered mentation EXAM: CT HEAD WITHOUT CONTRAST CT CERVICAL SPINE WITHOUT CONTRAST TECHNIQUE: Multidetector CT imaging of the head and cervical spine was performed following the standard protocol without intravenous contrast. Multiplanar CT image reconstructions of the cervical spine were also generated. COMPARISON:  None. FINDINGS: CT HEAD FINDINGS Brain: No acute territorial infarction, hemorrhage or intracranial mass. Atrophy and moderate small vessel ischemic changes of the white matter. Nonenlarged ventricles. Age indeterminate lacunar infarct in the right cerebellum. Vascular: No hyperdense vessels.  Carotid vascular calcification Skull: Normal. Negative for fracture or focal lesion. Sinuses/Orbits: No acute finding. Other: None CT CERVICAL SPINE FINDINGS Alignment: No subluxation.  Facet alignment within normal limits Skull base and vertebrae: No acute fracture. No primary bone lesion or focal pathologic process. Soft tissues and spinal canal: No prevertebral fluid or swelling. No visible canal hematoma. Disc levels: Moderate diffuse degenerative change throughout the cervical spine with multiple level disc space narrowing and osteophyte. Posterior disc osteophyte complex C5-C6 and C6-C7 with indentation of anterior thecal sac. Facet degenerative change at multiple levels. Upper chest: Negative. Other: None IMPRESSION: 1. Negative for acute  intracranial hemorrhage or mass. Possible age indeterminate lacunar infarct right cerebellum. Atrophy and small vessel ischemic changes of the white matter 2. Diffuse degenerative changes of the cervical spine. No acute osseous abnormality Electronically Signed   By: Jasmine Pang M.D.   On: 02/02/2019 20:18   Ct Cervical Spine Wo Contrast  Result Date: 02/02/2019 CLINICAL DATA:  Fall altered mentation EXAM: CT HEAD WITHOUT CONTRAST CT CERVICAL SPINE WITHOUT CONTRAST TECHNIQUE: Multidetector CT imaging of the head and cervical spine was performed following the standard protocol without intravenous contrast. Multiplanar CT image reconstructions of the cervical spine were also generated. COMPARISON:  None. FINDINGS: CT HEAD FINDINGS Brain: No acute territorial infarction, hemorrhage or intracranial mass. Atrophy and moderate small vessel ischemic changes of the white matter. Nonenlarged ventricles. Age indeterminate lacunar infarct in the right cerebellum. Vascular: No hyperdense vessels.  Carotid vascular calcification Skull: Normal. Negative for fracture or focal lesion. Sinuses/Orbits: No acute finding. Other: None CT CERVICAL SPINE FINDINGS Alignment: No subluxation.  Facet alignment within normal limits Skull base and vertebrae: No acute fracture. No primary bone lesion or focal pathologic process. Soft tissues and spinal canal: No prevertebral fluid or swelling. No visible canal hematoma. Disc levels: Moderate diffuse degenerative change throughout the cervical spine with multiple level disc space narrowing and osteophyte. Posterior disc osteophyte complex C5-C6 and C6-C7 with indentation of anterior thecal sac. Facet degenerative change at multiple levels. Upper chest: Negative. Other: None IMPRESSION: 1. Negative for acute intracranial hemorrhage or mass. Possible age indeterminate lacunar infarct right cerebellum. Atrophy and small vessel ischemic changes of the white matter 2. Diffuse degenerative  changes of the cervical spine. No acute osseous abnormality Electronically Signed   By: Jasmine Pang M.D.   On: 02/02/2019 20:18   Dg Chest Port 1 View  Result Date: 02/02/2019 CLINICAL DATA:  Fever, pain, concern for infection EXAM: PORTABLE CHEST 1 VIEW COMPARISON:  Radiograph 02/22/2017 FINDINGS: Dual lead pacer battery pack overlies the left chest wall with leads directed towards the cardiac apex and right atrium. Spinal stimulator wires terminate in the midthoracic spine. Severe dextrocurvature of the spine is again noted. Degenerative changes are present in the imaged spine and shoulders. There  are coarse interstitial opacities throughout the lungs with superimposed low volumes and basilar atelectasis. No consolidation, features of edema, pneumothorax, or effusion. The aorta is calcified. Mild cardiomegaly, similar to prior accounting for differences in technique. IMPRESSION: 1. Chronic coarse interstitial opacities throughout the lungs with superimposed low volumes and basilar atelectasis. 2. No consolidation. Electronically Signed   By: Kreg Shropshire M.D.   On: 02/02/2019 21:22    Review of Systems  Unable to perform ROS: Mental status change   Blood pressure (!) 123/100, pulse 91, temperature 99.3 F (37.4 C), temperature source Oral, resp. rate 19, height  (1.702 m), weight 95 kg, SpO2 98 %. Physical Exam  Constitutional: She appears well-developed and well-nourished. No distress.  HENT:  Head: Normocephalic and atraumatic.  Eyes: Conjunctivae are normal. Right eye exhibits no discharge. Left eye exhibits no discharge. No scleral icterus.  Neck: Normal range of motion.  Cardiovascular: Normal rate and regular rhythm.  Respiratory: Effort normal. No respiratory distress.  Musculoskeletal:     Comments: RLE No traumatic wounds, ecchymosis, or rash  Knee mod TTP, severe pain with PROM >5 degrees, no erythema  No ankle effusion, mod knee effusion  Sens DPN, SPN, TN grossly  intact  Motor EHL, ext, flex, evers grossly intact  DP 1+, PT 1+, No significant edema  Neurological: She is alert.  Skin: Skin is warm and dry. She is not diaphoretic.  Psychiatric: She has a normal mood and affect. Her behavior is normal.    Assessment/Plan: Right knee pain -- According to the daughter she has been having chronic problems with that knee causing pain, swelling, and gait difficulties for months. It does not seem this is an acute process and do not think it is the source of her sepsis. She should f/u with Dr. Turner Daniels at discharge but may ultimately need referral to a tertiary center for further care. UTI -- per primary service. I hope this is cause for her mental status and can be reversed quickly. Multiple medical problems including atrial fibrillation on coumadin, hypertension, diabetes mellitus, chronic kidney disease stage III -- per primary service     Freeman Caldron, PA-C Orthopedic Surgery [phone] 02/03/2019, 9:36 AM

## 2019-02-03 NOTE — ED Notes (Signed)
SDU 

## 2019-02-03 NOTE — ED Notes (Signed)
ED TO INPATIENT HANDOFF REPORT  ED Nurse Name and Phone #: Magnus Ivan, RN [phone]  S Name/Age/Gender Robin Snow 78 y.o. female Room/Bed: 025C/025C  Code Status   Code Status: Prior  Home/SNF/Other Home Patient oriented to: self, place, time and situation Is this baseline? No   Triage Complete: Triage complete  Chief Complaint afib  Triage Note Came in via ems; c/o fall d/t right knee weakness. Reported hx of Afib and is RVR on scene. EMS gave Cardizem 10 mg x2. EMS reported altered mentation as well worsenig x 2 weeks accrdng to family.    Allergies Allergies  Allergen Reactions  . Aspirin Hives  . Rofecoxib Hives  . Hydrocodone Hives    Tolerates oxycodone and tramadol    Level of Care/Admitting Diagnosis ED Disposition    ED Disposition Condition Comment   Admit  Hospital Area: [facility] [[id number]]  Level of Care: Progressive [102]  Admit to Progressive based on following criteria: MULTISYSTEM THREATS such as stable sepsis, metabolic/electrolyte imbalance with or without encephalopathy that is responding to early treatment.  Covid Evaluation: Asymptomatic Screening Protocol (No Symptoms)  Diagnosis: Sepsis [facility]) [[id number]]  Admitting Physician: Eduard Clos [phone]  Attending Physician: Eduard Clos [phone]  Estimated length of stay: past midnight tomorrow  Certification:: I certify this patient will need inpatient services for at least 2 midnights  PT Class (Do Not Modify): Inpatient [101]  PT Acc Code (Do Not Modify): Private [1]       B Medical/Surgery History Past Medical History:  Diagnosis Date  . AKI (acute kidney injury) (HCC) 04/2016  . Anemia   . Arthritis   . Chronic atrial fibrillation   . Chronic edema   . Chronic venous insufficiency   . CKD (chronic kidney disease), stage III (HCC)   . Coagulopathy (HCC)   . Diabetes mellitus    Type 2  . Diverticulosis   . Dyslipidemia   . GERD (gastroesophageal  reflux disease)   . GI bleed 2015   a. lower GI from diverticular source 2015.  [name] Hx of transfusion of packed red blood cells   . Hypertension   . Morbid obesity (HCC)   . Obstructive sleep apnea    on CPAP  . Pulmonary hypertension (HCC)   . Pyelonephritis 04/2016  . Renal infarct (HCC)    a. 04/2016- adm with flank pain, ? pyelo vs renal infarct in setting of recent subtherapeutic INR.  [name] Sinus bradycardia   . Stroke [facility]) 2015   Past Surgical History:  Procedure Laterality Date  . CESAREAN SECTION    . COLONOSCOPY Left 01/24/2013   Procedure: COLONOSCOPY;  Surgeon: Willis Modena, MD;  Location: [facility] ENDOSCOPY;  Service: Endoscopy;  Laterality: Left;  . ESOPHAGOGASTRODUODENOSCOPY Left 01/23/2013   Procedure: ESOPHAGOGASTRODUODENOSCOPY (EGD);  Surgeon: Willis Modena, MD;  Location: [facility] ENDOSCOPY;  Service: Endoscopy;  Laterality: Left;  . EYE SURGERY Bilateral    cataract surgery  . JOINT REPLACEMENT    . PACEMAKER IMPLANT [name] 09/09/2016   Procedure: Pacemaker Implant;  Surgeon: Marinus Maw, MD;  Location: [facility] INVASIVE CV LAB;  Service: Cardiovascular;  Laterality: [name];  . REPLACEMENT TOTAL KNEE BILATERAL    . TUBAL LIGATION       A IV Location/Drains/Wounds Patient Lines/Drains/Airways Status   Active Line/Drains/Airways    Name:   Placement date:   Placement time:   Site:   Days:   Peripheral IV Left Antecubital   -    -  Antecubital      Peripheral IV 02/02/19 Right Antecubital   02/02/19    2000    Antecubital   1   Incision 01/24/13 Groin Right   01/24/13    1648     2201   Incision (Closed) 09/09/16 Chest Left   09/09/16    1540     877          Intake/Output Last 24 hours No intake or output data in the 24 hours ending 02/03/19 0009  Labs/Imaging Results for orders placed or performed during the hospital encounter of 02/02/19 (from the past 48 hour(s))  CBC with Differential     Status: Abnormal   Collection Time: 02/02/19  7:23 PM  Result Value Ref Range    WBC 21.8 (H) 4.0 - 10.5 K/uL   RBC 3.94 3.87 - 5.11 MIL/uL   Hemoglobin 11.1 (L) 12.0 - 15.0 g/dL   HCT 69.6 (L) 29.5 - 28.4 %   MCV 84.0 80.0 - 100.0 fL   MCH 28.2 26.0 - 34.0 pg   MCHC 33.5 30.0 - 36.0 g/dL   RDW 13.2 (H) 44.0 - 10.2 %   Platelets 147 (L) 150 - 400 K/uL    Comment: REPEATED TO VERIFY   nRBC 0.1 0.0 - 0.2 %   Neutrophils Relative % 81 %   Neutro Abs 17.4 (H) 1.7 - 7.7 K/uL   Lymphocytes Relative 8 %   Lymphs Abs 1.7 0.7 - 4.0 K/uL   Monocytes Relative 9 %   Monocytes Absolute 2.1 (H) 0.1 - 1.0 K/uL   Eosinophils Relative 0 %   Eosinophils Absolute 0.0 0.0 - 0.5 K/uL   Basophils Relative 0 %   Basophils Absolute 0.1 0.0 - 0.1 K/uL   Immature Granulocytes 2 %   Abs Immature Granulocytes 0.52 (H) 0.00 - 0.07 K/uL    Comment: Performed at [facility] Lab, [address] [place], [place] [address]  Urinalysis, Routine w reflex microscopic     Status: Abnormal   Collection Time: 02/02/19  7:23 PM  Result Value Ref Range   Color, Urine YELLOW YELLOW   APPearance CLEAR CLEAR   Specific Gravity, Urine 1.013 1.005 - 1.030   pH 5.0 5.0 - 8.0   Glucose, UA NEGATIVE NEGATIVE mg/dL   Hgb urine dipstick SMALL (A) NEGATIVE   Bilirubin Urine NEGATIVE NEGATIVE   Ketones, ur NEGATIVE NEGATIVE mg/dL   Protein, ur NEGATIVE NEGATIVE mg/dL   Nitrite POSITIVE (A) NEGATIVE   Leukocytes,Ua LARGE (A) NEGATIVE   RBC / HPF 0-5 0 - 5 RBC/hpf   WBC, UA 21-50 0 - 5 WBC/hpf   Bacteria, UA MANY (A) NONE SEEN   Squamous Epithelial / LPF 0-5 0 - 5    Comment: Performed at [facility], [address] [place], [place] [address]  Lactic acid, plasma     Status: Abnormal   Collection Time: 02/02/19  7:31 PM  Result Value Ref Range   Lactic Acid, Venous 2.1 (HH) 0.5 - 1.9 mmol/L    Comment: CRITICAL RESULT CALLED TO, READ BACK BY AND VERIFIED WITH: RN L CHITON  02/02/2019 BY S GEZAHEGN Performed at [facility], [address] [place], [place] [address]    Comprehensive metabolic panel     Status: Abnormal   Collection Time: 02/02/19  7:31 PM  Result Value Ref Range   Sodium 142 135 - 145 mmol/L   Potassium 3.7 3.5 - 5.1 mmol/L   Chloride  105 98 - 111 mmol/L   CO2 25 22 - 32 mmol/L   Glucose, Bld 142 (H) 70 - 99 mg/dL   BUN 31 (H) 8 - 23 mg/dL   Creatinine, Ser 9.60 (H) 0.44 - 1.00 mg/dL   Calcium 8.9 8.9 - 45.4 mg/dL   Total Protein 7.6 6.5 - 8.1 g/dL   Albumin 2.8 (L) 3.5 - 5.0 g/dL   AST 28 15 - 41 U/L   ALT 16 0 - 44 U/L   Alkaline Phosphatase 91 38 - 126 U/L   Total Bilirubin 1.4 (H) 0.3 - 1.2 mg/dL   GFR calc non Af Amer 29 (L) >60 mL/min   GFR calc Af Amer 34 (L) >60 mL/min   Anion gap 12 5 - 15    Comment: Performed at [facility], [address], [place], [place] [address]  APTT     Status: None   Collection Time: 02/02/19  7:31 PM  Result Value Ref Range   aPTT 28 24 - 36 seconds    Comment: Performed at [facility], [address], [place], [place] [address]  Magnesium     Status: None   Collection Time: 02/02/19  7:31 PM  Result Value Ref Range   Magnesium 2.1 1.7 - 2.4 mg/dL    Comment: Performed at [facility] Lab, [address], [place], [place] [address]  Protime-INR     Status: None   Collection Time: 02/02/19  7:31 PM  Result Value Ref Range   Prothrombin Time 15.2 11.4 - 15.2 seconds   INR 1.2 0.8 - 1.2    Comment: (NOTE) INR goal varies based on device and disease states. Performed at [facility], [address], [place], [place] [address]   Lactic acid, plasma     Status: None   Collection Time: 02/02/19 10:15 PM  Result Value Ref Range   Lactic Acid, Venous 1.6 0.5 - 1.9 mmol/L    Comment: Performed at [facility] Lab, [address], [place], [place] [address]  Sedimentation rate     Status: Abnormal   Collection Time: 02/02/19 10:40 PM  Result Value Ref Range   Sed Rate 78 (H) 0 - 22 mm/hr    Comment: Performed at [facility] Lab, [address],  [place], [place] [address]  C-reactive protein     Status: Abnormal   Collection Time: 02/02/19 10:40 PM  Result Value Ref Range   CRP 19.7 (H) <1.0 mg/dL    Comment: Performed at [facility] Lab, [address] [place], [place] [address]   Dg Knee 2 Views Right  Result Date: 02/02/2019 CLINICAL DATA:  Pain, concern for infection EXAM: RIGHT KNEE - 1-2 VIEW COMPARISON:  CT knee 06/09/2017 FINDINGS: Extensive circumferential soft tissue swelling with a more large effusion and destructive change in lucency of the bone surrounding cemented femoral stem of the patient's total knee arthroplasty. Some destructive changes are noted near the posterior aspect of the tibial hardware is well. Alignment of the arthroplasty components is maintained. Sclerotic changes of the patella are more pronounced than on comparison exam. IMPRESSION: 1. Findings suspicious for septic arthritis and osteomyelitic changes of the distal femur, proximal tibia and patella 2. Hardware remains normally aligned. 3. Extensive circumferential soft tissue swelling. Electronically Signed   By: Kreg Shropshire M.D.   On: 02/02/2019 21:25   Ct Head Wo Contrast  Result Date: 02/02/2019 CLINICAL DATA:  Fall altered mentation  EXAM: CT HEAD WITHOUT CONTRAST CT CERVICAL SPINE WITHOUT CONTRAST TECHNIQUE: Multidetector CT imaging of the head and cervical spine was performed following the standard protocol without intravenous contrast. Multiplanar CT image reconstructions of the cervical spine were also generated. COMPARISON:  None. FINDINGS: CT HEAD FINDINGS Brain: No acute territorial infarction, hemorrhage or intracranial mass. Atrophy and moderate small vessel ischemic changes of the white matter. Nonenlarged ventricles. Age indeterminate lacunar infarct in the right cerebellum. Vascular: No hyperdense vessels.  Carotid vascular calcification Skull: Normal. Negative for fracture or focal lesion. Sinuses/Orbits: No acute finding. Other: None CT  CERVICAL SPINE FINDINGS Alignment: No subluxation.  Facet alignment within normal limits Skull base and vertebrae: No acute fracture. No primary bone lesion or focal pathologic process. Soft tissues and spinal canal: No prevertebral fluid or swelling. No visible canal hematoma. Disc levels: Moderate diffuse degenerative change throughout the cervical spine with multiple level disc space narrowing and osteophyte. Posterior disc osteophyte complex C5-C6 and C6-C7 with indentation of anterior thecal sac. Facet degenerative change at multiple levels. Upper chest: Negative. Other: None IMPRESSION: 1. Negative for acute intracranial hemorrhage or mass. Possible age indeterminate lacunar infarct right cerebellum. Atrophy and small vessel ischemic changes of the white matter 2. Diffuse degenerative changes of the cervical spine. No acute osseous abnormality Electronically Signed   By: Jasmine PangKim  Fujinaga M.D.   On: 02/02/2019 20:18   Ct Cervical Spine Wo Contrast  Result Date: 02/02/2019 CLINICAL DATA:  Fall altered mentation EXAM: CT HEAD WITHOUT CONTRAST CT CERVICAL SPINE WITHOUT CONTRAST TECHNIQUE: Multidetector CT imaging of the head and cervical spine was performed following the standard protocol without intravenous contrast. Multiplanar CT image reconstructions of the cervical spine were also generated. COMPARISON:  None. FINDINGS: CT HEAD FINDINGS Brain: No acute territorial infarction, hemorrhage or intracranial mass. Atrophy and moderate small vessel ischemic changes of the white matter. Nonenlarged ventricles. Age indeterminate lacunar infarct in the right cerebellum. Vascular: No hyperdense vessels.  Carotid vascular calcification Skull: Normal. Negative for fracture or focal lesion. Sinuses/Orbits: No acute finding. Other: None CT CERVICAL SPINE FINDINGS Alignment: No subluxation.  Facet alignment within normal limits Skull base and vertebrae: No acute fracture. No primary bone lesion or focal pathologic process.  Soft tissues and spinal canal: No prevertebral fluid or swelling. No visible canal hematoma. Disc levels: Moderate diffuse degenerative change throughout the cervical spine with multiple level disc space narrowing and osteophyte. Posterior disc osteophyte complex C5-C6 and C6-C7 with indentation of anterior thecal sac. Facet degenerative change at multiple levels. Upper chest: Negative. Other: None IMPRESSION: 1. Negative for acute intracranial hemorrhage or mass. Possible age indeterminate lacunar infarct right cerebellum. Atrophy and small vessel ischemic changes of the white matter 2. Diffuse degenerative changes of the cervical spine. No acute osseous abnormality Electronically Signed   By: Jasmine PangKim  Fujinaga M.D.   On: 02/02/2019 20:18   Dg Chest Port 1 View  Result Date: 02/02/2019 CLINICAL DATA:  Fever, pain, concern for infection EXAM: PORTABLE CHEST 1 VIEW COMPARISON:  Radiograph 02/22/2017 FINDINGS: Dual lead pacer battery pack overlies the left chest wall with leads directed towards the cardiac apex and right atrium. Spinal stimulator wires terminate in the midthoracic spine. Severe dextrocurvature of the spine is again noted. Degenerative changes are present in the imaged spine and shoulders. There are coarse interstitial opacities throughout the lungs with superimposed low volumes and basilar atelectasis. No consolidation, features of edema, pneumothorax, or effusion. The aorta is calcified. Mild cardiomegaly, similar to prior accounting for differences in  technique. IMPRESSION: 1. Chronic coarse interstitial opacities throughout the lungs with superimposed low volumes and basilar atelectasis. 2. No consolidation. Electronically Signed   By: Lovena Le M.D.   On: 02/02/2019 21:22    Pending Labs Unresulted Labs (From admission, onward)    Start     Ordered   02/02/19 2203  SARS CORONAVIRUS 2 (TAT 6-24 HRS) Nasopharyngeal Nasopharyngeal Swab  (Asymptomatic/Tier 2 Patients Labs)  Once,   STAT     Question Answer Comment  Is this test for diagnosis or screening Screening   Symptomatic for COVID-19 as defined by CDC No   Hospitalized for COVID-19 No   Admitted to ICU for COVID-19 No   Previously tested for COVID-19 No   Resident in a congregate (group) care setting No   Employed in healthcare setting No   Pregnant No      02/02/19 2202   02/02/19 1931  Urine culture  ONCE - STAT,   STAT     02/02/19 1937   02/02/19 1927  Blood culture (routine x 2)  BLOOD CULTURE X 2,   STAT     02/02/19 1926          Vitals/Pain Today's Vitals   02/02/19 2115 02/02/19 2130 02/02/19 2145 02/02/19 2328  BP: 127/80 125/70 122/84 (!) 138/98  Pulse:    (!) 105  Resp: 19 (!) 27 (!) 24 16  Temp:    98 F (36.7 C)  TempSrc:    Oral  SpO2:    100%  Weight:      Height:        Isolation Precautions No active isolations  Medications Medications  0.9 %  sodium chloride infusion (has no administration in time range)  vancomycin (VANCOCIN) IVPB 750 mg/150 ml premix (has no administration in time range)  ceFEPIme (MAXIPIME) 2 g in sodium chloride 0.9 % 100 mL IVPB (has no administration in time range)  ceFEPIme (MAXIPIME) 2 g in sodium chloride 0.9 % 100 mL IVPB (0 g Intravenous Stopped 02/02/19 2047)  metroNIDAZOLE (FLAGYL) IVPB 500 mg (0 mg Intravenous Stopped 02/02/19 2122)  sodium chloride 0.9 % bolus 500 mL (0 mLs Intravenous Stopped 02/02/19 2051)  acetaminophen (TYLENOL) tablet 1,000 mg (1,000 mg Oral Given 02/02/19 2022)  vancomycin (VANCOCIN) 1,750 mg in sodium chloride 0.9 % 500 mL IVPB (1,750 mg Intravenous New Bag/Given 02/02/19 2050)    Mobility non-ambulatory     Focused Assessments Neuro Assessment Handoff:  Swallow screen pass? Yes          Neuro Assessment:   Neuro Checks:      Last Documented NIHSS Modified Score:   Has TPA been given? No If patient is a Neuro Trauma and patient is going to OR before floor call report to [place] nurse: [phone] or  [phone]     R Recommendations: See Admitting Provider Note  Report given to:   Additional Notes:

## 2019-02-03 NOTE — Progress Notes (Signed)
Robin Snow is a 78 y.o. female with history of atrial fibrillation, hypertension, diabetes mellitus, chronic kidney disease stage III had a fall at home when patient was trying to get out of the bed.  Patient states she did not hit her head or lose consciousness.  She fell onto her right knee.  Patient as per the daughter with whom I spoke has been having chronic issues with the right knee and swelling which patient cannot place weight on.  Given the fall and increasing pain in the right knee patient was brought to the ER.  ED Course: In the ER patient was febrile with temperature of 101.5 F lab work show WBC count of 21.8 hemoglobin 11.1 platelets 147 creatinine 1.6 lactic acid was 2.1 which improved to 1.6 CRP was 19.7 UA was consistent with UTI.  Right knee was showing features concerning for osteomyelitis and septic arthritis.  On-call orthopedic surgeon Dr. Doreatha Martin has been consulted will be seeing patient in consult.  Chest x-ray was showing chronic changes.  Patient was empirically started on antibiotic for sepsis.  At presentation patient was in A. fib with RVR and rate improved with IV fluid bolus.  02/03/19: Patient was seen and examined at her bedside.  She is alert and interactive. UA +, urine culture, blood cultures x2.  Leukocytosis with tachycardia. Started on  IV cefepime, IV vancomycin and IV Flagyl empirically.  Follow cultures.  Please refer to H&P dictated by Dr. Hal Hope on 02/03/2019 for further details of the assessment and plan.

## 2019-02-03 NOTE — Progress Notes (Signed)
ANTICOAGULATION CONSULT NOTE - Initial Consult  Pharmacy Consult for Coumadin Indication: atrial fibrillation  Allergies  Allergen Reactions  . Aspirin Hives and Swelling    Angioedema   . Rofecoxib Hives  . Hydrocodone Hives    Tolerates oxycodone and tramadol    Patient Measurements: Height: 5\' 7"  (170.2 cm) Weight: 209 lb 7 oz (95 kg) IBW/kg (Calculated) : 61.6  Vital Signs: Temp: 98.9 F (37.2 C) (10/30 1204) Temp Source: Oral (10/30 1204) BP: 129/92 (10/30 1204) Pulse Rate: 78 (10/30 1204)  Labs: Recent Labs    02/02/19 1923 02/02/19 1931 02/03/19 0209  HGB 11.1*  --  11.5*  HCT 33.1*  --  35.7*  PLT 147*  --  PLATELET CLUMPS NOTED ON SMEAR, UNABLE TO ESTIMATE  APTT  --  28  --   LABPROT  --  15.2  --   INR  --  1.2  --   CREATININE  --  1.64* 1.39*    Estimated Creatinine Clearance: 38.9 mL/min (A) (by C-G formula based on SCr of 1.39 mg/dL (H)).   Medical History: Past Medical History:  Diagnosis Date  . AKI (acute kidney injury) ([place]) 04/2016  . Anemia   . Arthritis   . Chronic atrial fibrillation ([place])   . Chronic edema   . Chronic venous insufficiency   . CKD (chronic kidney disease), stage III   . Coagulopathy ([place])   . Diabetes mellitus    Type 2  . Diverticulosis   . Dyslipidemia   . GERD (gastroesophageal reflux disease)   . GI bleed 2015   a. lower GI from diverticular source 2015.  [name] Hx of transfusion of packed red blood cells   . Hypertension   . Morbid obesity ([place])   . Obstructive sleep apnea    on CPAP  . Pulmonary hypertension ([place])   . Pyelonephritis 04/2016  . Renal infarct ([place])    a. 04/2016- adm with flank pain, ? pyelo vs renal infarct in setting of recent subtherapeutic INR.  [name] Sinus bradycardia   . Stroke [facility]) 2015   Assessment:  Anticoag: warfarin PTA for afib/CVA initally held in case ortho procedure. INR 1.2 on 10/29. Hgb 11.5 stable.  Resume warfarin 10/30. - PTA dosing 10mg  WSS, 7.5mg  TTThF with admit INR  1.2?  Goal of Therapy:  INR 2-3 Monitor platelets by anticoagulation protocol: Yes   Plan:  Coumadin 10mg  po x 1 tonight Daily INR   Latanga Nedrow S. Alford Highland, PharmD, BCPS Clinical Staff Pharmacist [facility] 02/03/2019,12:19 PM

## 2019-02-03 NOTE — ED Notes (Signed)
Breakfast Ordered 

## 2019-02-04 DIAGNOSIS — A419 Sepsis, unspecified organism: Secondary | ICD-10-CM

## 2019-02-04 LAB — GLUCOSE, CAPILLARY
Glucose-Capillary: 81 mg/dL (ref 70–99)
Glucose-Capillary: 135 mg/dL — ABNORMAL HIGH (ref 70–99)
Glucose-Capillary: 115 mg/dL — ABNORMAL HIGH (ref 70–99)
Glucose-Capillary: 151 mg/dL — ABNORMAL HIGH (ref 70–99)

## 2019-02-04 LAB — COMPREHENSIVE METABOLIC PANEL WITH GFR
ALT: 13 U/L (ref 0–44)
AST: 15 U/L (ref 15–41)
Albumin: 2.2 g/dL — ABNORMAL LOW (ref 3.5–5.0)
Alkaline Phosphatase: 79 U/L (ref 38–126)
Anion gap: 14 (ref 5–15)
BUN: 21 mg/dL (ref 8–23)
CO2: 21 mmol/L — ABNORMAL LOW (ref 22–32)
Calcium: 8.4 mg/dL — ABNORMAL LOW (ref 8.9–10.3)
Chloride: 108 mmol/L (ref 98–111)
Creatinine, Ser: 1.05 mg/dL — ABNORMAL HIGH (ref 0.44–1.00)
GFR calc Af Amer: 58 mL/min — ABNORMAL LOW
GFR calc non Af Amer: 50 mL/min — ABNORMAL LOW
Glucose, Bld: 88 mg/dL (ref 70–99)
Potassium: 3.6 mmol/L (ref 3.5–5.1)
Sodium: 143 mmol/L (ref 135–145)
Total Bilirubin: 0.9 mg/dL (ref 0.3–1.2)
Total Protein: 6.5 g/dL (ref 6.5–8.1)

## 2019-02-04 LAB — CBC
HCT: 29.1 % — ABNORMAL LOW (ref 36.0–46.0)
Hemoglobin: 9.6 g/dL — ABNORMAL LOW (ref 12.0–15.0)
MCH: 27.8 pg (ref 26.0–34.0)
MCHC: 33 g/dL (ref 30.0–36.0)
MCV: 84.3 fL (ref 80.0–100.0)
Platelets: UNDETERMINED K/uL (ref 150–400)
RBC: 3.45 MIL/uL — ABNORMAL LOW (ref 3.87–5.11)
RDW: 16.1 % — ABNORMAL HIGH (ref 11.5–15.5)
WBC: 19.6 K/uL — ABNORMAL HIGH (ref 4.0–10.5)
nRBC: 0 % (ref 0.0–0.2)

## 2019-02-04 LAB — PROTIME-INR
INR: 1.3 — ABNORMAL HIGH (ref 0.8–1.2)
Prothrombin Time: 15.8 s — ABNORMAL HIGH (ref 11.4–15.2)

## 2019-02-04 MED ORDER — WARFARIN SODIUM 5 MG PO TABS
10.0000 mg | ORAL_TABLET | Freq: Once | ORAL | Status: AC
  Administered 2019-02-04: 10 mg via ORAL
  Filled 2019-02-04: qty 2

## 2019-02-04 NOTE — Progress Notes (Signed)
PROGRESS NOTE  Robin Snow PJK:[id number] DOB: 1940-04-23 DOA: 02/02/2019 PCP: [name], MD  HPI/Recap of past 24 hours: Robin Snow a 78 y.o.femalewithhistory of atrial fibrillation, hypertension, diabetes mellitus, chronic kidney disease stage III had a fall at home when patient was trying to get out of the bed. Patient states she did not hit her head or lose consciousness. She fell onto her right knee. Patient as per the daughter with whom I spoke has been having chronic issues with the right knee and swelling which patient cannot place weight on. Given the fall and increasing pain in the right knee patient was brought to the ER.  ED Course:In the ER patient was febrile with temperature of 101.5 F lab work show WBC count of 21.8 hemoglobin 11.1 platelets 147 creatinine 1.6 lactic acid was 2.1 which improved to 1.6 CRP was 19.7 UA was consistent with UTI. Right knee was showing features concerning for osteomyelitis and septic arthritis. On-call orthopedic surgeon Dr. Doreatha Martin has been consulted will be seeing patient in consult. Chest x-ray was showing chronic changes. Patient was empirically started on antibiotic for sepsis. At presentation patient was in A. fib with RVR and rate improved with IV fluid bolus.  Seen by orthopedic surgery no planned procedures.  She will need to follow-up with her primary surgeon in [place].  Blood cultures x2 drawn on 02/02/2019 + for E. coli.  IV antibiotics appropriately de-escalated to ceftriaxone.  Blood cultures x2 repeated on 02/04/2019.   02/04/19: seen and examined at her bedside this morning.  She is alert, pleasantly confused.    Assessment/Plan: Principal Problem:   Sepsis ([place]) Active Problems:   Essential hypertension   Diabetes mellitus with complication (HCC)   CKD (chronic kidney disease) stage 3, GFR 30-59 ml/min   Chronic diastolic CHF (congestive heart failure) (HCC)   Warm antibody hemolytic  anemia   Atrial fibrillation with RVR (HCC)  Sepsis secondary to E. coli bacteremia  Presented with a fever, tachycardia and tachypnea, blood culture drawn on 02/02/2019 + E. coli Repeated blood cultures x2 peripherally on 02/04/2019 Follow cultures Continue Rocephin Continue to monitor fever curve and WBC WBC is trending down  Acute metabolic encephalopathy likely in the setting of acute illness  Reorient as needed Fall precautions  Chronic right knee pain Seen by orthopedic surgery, no planned procedures. Follow-up with Dr. Mayer Camel at discharge  Presumed UTI Urine culture taken on 02/02/2019 unrevealing Continue Rocephin empirically  Permanent A. fib with RVR. RVR is improved.    Rate is currently controlled on diltiazem and Lopressor.  On Coumadin for CVA prevention.  INR subtherapeutic.  Pharmacy managing Coumadin.    Type II diabetes mellitus type, tightly controlled.  A1c 5.8 on 02/03/2019.  Avoid hypoglycemia.  Hypertension on beta-blockers Cardizem and lisinopril.  Note that patient has chronic kidney disease.  Holding hydrochlorothiazide since patient is receiving fluids.  Blood pressure is at goal.  Continue current management.  Chronic kidney disease stage III creatinine appears to be at baseline 1.05 GFR 58 on 02/04/2019.  Note that patient is on lisinopril.  Closely monitor urine output.  Continue to closely monitor renal function.  Anemia and thrombocytopenia with history of autoimmune hemolytic anemia follow CBC closely.  Patient is on prednisone (confirmed with patient's daughter).  Will give stress dose steroids.  Hemoglobin 9.6 on 02/04/2019 from 11.5 on 02/03/2019.  Hyperlipidemia: Continue on statins.  Physical therapy PT OT to assess Fall precautions.    DVT prophylaxis: Coumadin Code  Status: Full code. Family Communication: None at bedside.  Disposition Plan:  Possible discharge in the next 24 to 48 hours pending PT OT evaluation.   Consults  called: Orthopedics.   Objective: Vitals:   02/03/19 2109 02/03/19 2130 02/04/19 0456 02/04/19 0925  BP: 124/84 118/73 122/78 122/72  Pulse: (!) 108 76 73 94  Resp:    16  Temp:  98.2 F (36.8 C) 98.3 F (36.8 C) 98.7 F (37.1 C)  TempSrc:  Oral Oral Oral  SpO2:  98% 98% 96%  Weight:      Height:        Intake/Output Summary (Last 24 hours) at 02/04/2019 1250 Last data filed at 02/04/2019 1100 Gross per 24 hour  Intake 2283.19 ml  Output 1400 ml  Net 883.19 ml   Filed Weights   02/02/19 1922 02/03/19 0506  Weight: 81.6 kg 95 kg    Exam:  . General: 78 y.o. year-old female well developed well nourished in no acute distress.  Alert and confused. . Cardiovascular: Irregular rate and rhythm with no rubs or gallops.  No thyromegaly or JVD noted.   [name] Respiratory: Clear to auscultation with no wheezes or rales. Good inspiratory effort. . Abdomen: Soft nontender nondistended with normal bowel sounds x4 quadrants. . Musculoskeletal: Trace lower extremity edema.  Hyperpigmented skin in the lower extremities bilaterally . Psychiatry: Mood is appropriate for condition and setting   Data Reviewed: CBC: Recent Labs  Lab 02/02/19 1923 02/03/19 0209 02/04/19 0743  WBC 21.8* 20.5* 19.6*  NEUTROABS 17.4*  --   --   HGB 11.1* 11.5* 9.6*  HCT 33.1* 35.7* 29.1*  MCV 84.0 86.4 84.3  PLT 147* PLATELET CLUMPS NOTED ON SMEAR, UNABLE TO ESTIMATE PLATELET CLUMPS NOTED ON SMEAR, UNABLE TO ESTIMATE   Basic Metabolic Panel: Recent Labs  Lab 02/02/19 1931 02/03/19 0209 02/03/19 1157 02/04/19 0743  NA 142 141  --  143  K 3.7 5.8* 3.4* 3.6  CL 105 105  --  108  CO2 25 24  --  21*  GLUCOSE 142* 121*  --  88  BUN 31* 30*  --  21  CREATININE 1.64* 1.39*  --  1.05*  CALCIUM 8.9 8.5*  --  8.4*  MG 2.1  --   --   --    GFR: Estimated Creatinine Clearance: 51.4 mL/min (A) (by C-G formula based on SCr of 1.05 mg/dL (H)). Liver Function Tests: Recent Labs  Lab 02/02/19 1931  02/04/19 0743  AST 28 15  ALT 16 13  ALKPHOS 91 79  BILITOT 1.4* 0.9  PROT 7.6 6.5  ALBUMIN 2.8* 2.2*   No results for input(s): LIPASE, AMYLASE in the last 168 hours. No results for input(s): AMMONIA in the last 168 hours. Coagulation Profile: Recent Labs  Lab 02/02/19 1931  INR 1.2   Cardiac Enzymes: No results for input(s): CKTOTAL, CKMB, CKMBINDEX, TROPONINI in the last 168 hours. BNP (last 3 results) No results for input(s): PROBNP in the last 8760 hours. HbA1C: Recent Labs    02/03/19 0209  HGBA1C 5.8*   CBG: Recent Labs  Lab 02/03/19 0801 02/03/19 1212 02/03/19 1747 02/04/19 0846 02/04/19 1215  GLUCAP 103* 97 112* 81 135*   Lipid Profile: No results for input(s): CHOL, HDL, LDLCALC, TRIG, CHOLHDL, LDLDIRECT in the last 72 hours. Thyroid Function Tests: No results for input(s): TSH, T4TOTAL, FREET4, T3FREE, THYROIDAB in the last 72 hours. Anemia Panel: No results for input(s): VITAMINB12, FOLATE, FERRITIN, TIBC, IRON, RETICCTPCT in the  last 72 hours. Urine analysis:    Component Value Date/Time   COLORURINE YELLOW 02/02/2019 1923   APPEARANCEUR CLEAR 02/02/2019 1923   LABSPEC 1.013 02/02/2019 1923   PHURINE 5.0 02/02/2019 1923   GLUCOSEU NEGATIVE 02/02/2019 1923   GLUCOSEU NEGATIVE 09/06/2017 1200   HGBUR SMALL (A) 02/02/2019 1923   BILIRUBINUR NEGATIVE 02/02/2019 1923   BILIRUBINUR Neg 10/18/2017 1453   KETONESUR NEGATIVE 02/02/2019 1923   PROTEINUR NEGATIVE 02/02/2019 1923   UROBILINOGEN 0.2 10/18/2017 1453   UROBILINOGEN 1.0 09/06/2017 1200   NITRITE POSITIVE (A) 02/02/2019 1923   LEUKOCYTESUR LARGE (A) 02/02/2019 1923   Sepsis Labs: @LABRCNTIP (procalcitonin:4,lacticidven:4)  ) Recent Results (from the past 240 hour(s))  Blood culture (routine x 2)     Status: Abnormal (Preliminary result)   Collection Time: 02/02/19  8:00 PM   Specimen: BLOOD RIGHT ARM  Result Value Ref Range Status   Specimen Description BLOOD RIGHT ARM  Final    Special Requests   Final    BOTTLES DRAWN AEROBIC AND ANAEROBIC Blood Culture adequate volume   Culture  Setup Time   Final    GRAM NEGATIVE RODS IN BOTH AEROBIC AND ANAEROBIC BOTTLES CRITICAL RESULT CALLED TO, READ BACK BY AND VERIFIED WITH10/31/20 PHARMD 1434 02/03/19 A BROWNING Performed at [facility], [address] [place], [place]    Culture ESCHERICHIA COLI (A)  Final   Report Status PENDING  Incomplete  Blood Culture ID Panel (Reflexed)     Status: Abnormal   Collection Time: 02/02/19  8:00 PM  Result Value Ref Range Status   Enterococcus species NOT DETECTED NOT DETECTED Final   Listeria monocytogenes NOT DETECTED NOT DETECTED Final   Staphylococcus species NOT DETECTED NOT DETECTED Final   Staphylococcus aureus (BCID) NOT DETECTED NOT DETECTED Final   Streptococcus species NOT DETECTED NOT DETECTED Final   Streptococcus agalactiae NOT DETECTED NOT DETECTED Final   Streptococcus pneumoniae NOT DETECTED NOT DETECTED Final   Streptococcus pyogenes NOT DETECTED NOT DETECTED Final   Acinetobacter baumannii NOT DETECTED NOT DETECTED Final   Enterobacteriaceae species DETECTED (A) NOT DETECTED Final    Comment: Enterobacteriaceae represent a large family of gram-negative bacteria, not a single organism. CRITICAL RESULT CALLED TO, READ BACK BY AND VERIFIED WITH: 02/04/19 PHARMD 1434 02/03/19 A BROWNING    Enterobacter cloacae complex NOT DETECTED NOT DETECTED Final   Escherichia coli DETECTED (A) NOT DETECTED Final    Comment: CRITICAL RESULT CALLED TO, READ BACK BY AND VERIFIED WITH: 02/05/19 PHARMD 1434 02/03/19 A BROWNING    Klebsiella oxytoca NOT DETECTED NOT DETECTED Final   Klebsiella pneumoniae NOT DETECTED NOT DETECTED Final   Proteus species NOT DETECTED NOT DETECTED Final   Serratia marcescens NOT DETECTED NOT DETECTED Final   Carbapenem resistance NOT DETECTED NOT DETECTED Final   Haemophilus influenzae NOT DETECTED NOT DETECTED Final    Neisseria meningitidis NOT DETECTED NOT DETECTED Final   Pseudomonas aeruginosa NOT DETECTED NOT DETECTED Final   Candida albicans NOT DETECTED NOT DETECTED Final   Candida glabrata NOT DETECTED NOT DETECTED Final   Candida krusei NOT DETECTED NOT DETECTED Final   Candida parapsilosis NOT DETECTED NOT DETECTED Final   Candida tropicalis NOT DETECTED NOT DETECTED Final    Comment: Performed at [facility], [address], [place], [place]  Blood culture (routine x 2)     Status: None (Preliminary result)   Collection Time: 02/02/19  8:05 PM   Specimen:  BLOOD LEFT ARM  Result Value Ref Range Status   Specimen Description BLOOD LEFT ARM  Final   Special Requests   Final    BOTTLES DRAWN AEROBIC AND ANAEROBIC Blood Culture adequate volume   Culture  Setup Time   Final    IN BOTH AEROBIC AND ANAEROBIC BOTTLES GRAM NEGATIVE RODS CRITICAL VALUE NOTED.  VALUE IS CONSISTENT WITH PREVIOUSLY REPORTED AND CALLED VALUE.    Culture   Final    NO GROWTH 2 DAYS Performed at [facility], [address], [place], [place] [address]    Report Status PENDING  Incomplete  Urine culture     Status: Abnormal   Collection Time: 02/02/19  8:36 PM   Specimen: In/Out Cath Urine  Result Value Ref Range Status   Specimen Description IN/OUT CATH URINE  Final   Special Requests   Final    NONE Performed at [facility], [address], [place], [place] [address]    Culture MULTIPLE SPECIES PRESENT, SUGGEST RECOLLECTION (A)  Final   Report Status 02/03/2019 FINAL  Final  SARS CORONAVIRUS 2 (TAT 6-24 HRS) Nasopharyngeal Nasopharyngeal Swab     Status: None   Collection Time: 02/02/19 10:03 PM   Specimen: Nasopharyngeal Swab  Result Value Ref Range Status   SARS Coronavirus 2 NEGATIVE NEGATIVE Final    Comment: (NOTE) SARS-CoV-2 target nucleic acids are NOT DETECTED. The SARS-CoV-2 RNA is generally detectable in upper and lower respiratory specimens during the acute phase of  infection. Negative results do not preclude SARS-CoV-2 infection, do not rule out co-infections with other pathogens, and should not be used as the sole basis for treatment or other patient management decisions. Negative results must be combined with clinical observations, patient history, and epidemiological information. The expected result is Negative. Fact Sheet for Patients: HairSlick.no Fact Sheet for Healthcare Providers: quierodirigir.com This test is not yet approved or cleared by the [place] FDA and  has been authorized for detection and/or diagnosis of SARS-CoV-2 by FDA under an Emergency Use Authorization (EUA). This EUA will remain  in effect (meaning this test can be used) for the duration of the COVID-19 declaration under Section 56 4(b)(1) of the Act, 21 U.S.C. section 360bbb-3(b)(1), unless the authorization is terminated or revoked sooner. Performed at [facility], [address] [place], [place] [address]   MRSA PCR Screening     Status: None   Collection Time: 02/03/19  5:28 AM   Specimen: Nasal Mucosa; Nasopharyngeal  Result Value Ref Range Status   MRSA by PCR NEGATIVE NEGATIVE Final    Comment:        The GeneXpert MRSA Assay (FDA approved for NASAL specimens only), is one component of a comprehensive MRSA colonization surveillance program. It is not intended to diagnose MRSA infection nor to guide or monitor treatment for MRSA infections. Performed at [facility], [address] [place], [place] [address]       Studies: No results found.  Scheduled Meds: . diltiazem  180 mg Oral Daily  . ferrous sulfate  325 mg Oral BID  . gabapentin  300 mg Oral TID  . insulin aspart  0-9 Units Subcutaneous TID WC  . lisinopril  20 mg Oral Daily  . metoprolol tartrate  50 mg Oral BID  . pravastatin  20 mg Oral q1800  . predniSONE  5 mg Oral Q breakfast  . Warfarin - Pharmacist  Dosing Inpatient   Does not apply [phone]  Continuous Infusions: . sodium chloride 1,000 mL (02/04/19 0923)  . cefTRIAXone (ROCEPHIN)  IV 2 g (02/03/19 2111)  . metronidazole 500 mg (02/04/19 1240)     LOS: 2 days     Darlin Droparole N , MD [facility] Pager [phone]  If 7PM-7AM, please contact night-coverage [email or web address] Password [email or web address] 02/04/2019, 12:50 PM

## 2019-02-04 NOTE — Progress Notes (Signed)
[place] for Coumadin Indication: atrial fibrillation  Allergies  Allergen Reactions  . Aspirin Hives and Swelling    Angioedema   . Rofecoxib Hives  . Hydrocodone Hives    Tolerates oxycodone and tramadol    Patient Measurements: Height: 5\' 7"  (170.2 cm) Weight: 209 lb 7 oz (95 kg) IBW/kg (Calculated) : 61.6  Vital Signs: Temp: 98.4 F (36.9 C) (10/31 1612) Temp Source: Oral (10/31 1612) BP: 131/72 (10/31 1612) Pulse Rate: 92 (10/31 1612)  Labs: Recent Labs    02/02/19 1923 02/02/19 1931 02/03/19 0209 02/04/19 0743 02/04/19 1445  HGB 11.1*  --  11.5* 9.6*  --   HCT 33.1*  --  35.7* 29.1*  --   PLT 147*  --  PLATELET CLUMPS NOTED ON SMEAR, UNABLE TO ESTIMATE PLATELET CLUMPS NOTED ON SMEAR, UNABLE TO ESTIMATE  --   APTT  --  28  --   --   --   LABPROT  --  15.2  --   --  15.8*  INR  --  1.2  --   --  1.3*  CREATININE  --  1.64* 1.39* 1.05*  --     Estimated Creatinine Clearance: 51.4 mL/min (A) (by C-G formula based on SCr of 1.05 mg/dL (H)).   Medical History: Past Medical History:  Diagnosis Date  . AKI (acute kidney injury) ([place]) 04/2016  . Anemia   . Arthritis   . Chronic atrial fibrillation ([place])   . Chronic edema   . Chronic venous insufficiency   . CKD (chronic kidney disease), stage III   . Coagulopathy ([place])   . Diabetes mellitus    Type 2  . Diverticulosis   . Dyslipidemia   . GERD (gastroesophageal reflux disease)   . GI bleed 2015   a. lower GI from diverticular source 2015.  [name] Hx of transfusion of packed red blood cells   . Hypertension   . Morbid obesity ([place])   . Obstructive sleep apnea    on CPAP  . Pulmonary hypertension ([place])   . Pyelonephritis 04/2016  . Renal infarct ([place])    a. 04/2016- adm with flank pain, ? pyelo vs renal infarct in setting of recent subtherapeutic INR.  [name] Sinus bradycardia   . Stroke [facility]) 2015   Assessment: 49 yof on warfarin PTA for afib/CVA initally held in  case ortho procedure. INR 1.2 on 10/29. Hgb 11.5 stable.  Resume warfarin 10/30.  - PTA dosing 10mg  WSS, 7.5mg  TTThF with admit INR 1.2?  INR today is 1.3, received 10 mg last night. Hgb down slightly to 9.6, plt clumped today - will monitor closely. No bleeding documented.   Goal of Therapy:  INR 2-3 Monitor platelets by anticoagulation protocol: Yes   Plan:  Coumadin 10mg  po x 1 tonight Daily INR Monitor CBC given downward trend   Antonietta Jewel, PharmD, BCCCP Clinical Pharmacist  Phone: [phone]  Please check AMION for all [place] phone numbers After 10:00 PM, call [place] [phone] 02/04/2019,6:25 PM

## 2019-02-04 NOTE — Plan of Care (Signed)

## 2019-02-05 LAB — CBC
HCT: 30 % — ABNORMAL LOW (ref 36.0–46.0)
Hemoglobin: 10.2 g/dL — ABNORMAL LOW (ref 12.0–15.0)
MCH: 27.9 pg (ref 26.0–34.0)
MCHC: 34 g/dL (ref 30.0–36.0)
MCV: 82 fL (ref 80.0–100.0)
Platelets: 129 10*3/uL — ABNORMAL LOW (ref 150–400)
RBC: 3.66 MIL/uL — ABNORMAL LOW (ref 3.87–5.11)
RDW: 16.2 % — ABNORMAL HIGH (ref 11.5–15.5)
WBC: 17.8 10*3/uL — ABNORMAL HIGH (ref 4.0–10.5)
nRBC: 0.1 % (ref 0.0–0.2)

## 2019-02-05 LAB — GLUCOSE, CAPILLARY
Glucose-Capillary: 127 mg/dL — ABNORMAL HIGH (ref 70–99)
Glucose-Capillary: 146 mg/dL — ABNORMAL HIGH (ref 70–99)
Glucose-Capillary: 87 mg/dL (ref 70–99)
Glucose-Capillary: 108 mg/dL — ABNORMAL HIGH (ref 70–99)

## 2019-02-05 LAB — CULTURE, BLOOD (ROUTINE X 2): Special Requests: ADEQUATE

## 2019-02-05 LAB — BASIC METABOLIC PANEL
Anion gap: 9 (ref 5–15)
BUN: 20 mg/dL (ref 8–23)
CO2: 22 mmol/L (ref 22–32)
Calcium: 8.3 mg/dL — ABNORMAL LOW (ref 8.9–10.3)
Chloride: 110 mmol/L (ref 98–111)
Creatinine, Ser: 1.01 mg/dL — ABNORMAL HIGH (ref 0.44–1.00)
GFR calc Af Amer: >60 mL/min (ref >60)
GFR calc non Af Amer: 53 mL/min — ABNORMAL LOW (ref >60)
Glucose, Bld: 97 mg/dL (ref 70–99)
Potassium: 3.5 mmol/L (ref 3.5–5.1)
Sodium: 141 mmol/L (ref 135–145)

## 2019-02-05 LAB — PROTIME-INR
INR: 1.5 — ABNORMAL HIGH (ref 0.8–1.2)
Prothrombin Time: 17.9 seconds — ABNORMAL HIGH (ref 11.4–15.2)

## 2019-02-05 MED ORDER — WARFARIN SODIUM 5 MG PO TABS
10.0000 mg | ORAL_TABLET | Freq: Once | ORAL | Status: AC
  Administered 2019-02-05: 10 mg via ORAL
  Filled 2019-02-05: qty 2

## 2019-02-05 NOTE — Progress Notes (Signed)
[place] for Coumadin Indication: atrial fibrillation  Allergies  Allergen Reactions  . Aspirin Hives and Swelling    Angioedema   . Rofecoxib Hives  . Hydrocodone Hives    Tolerates oxycodone and tramadol    Patient Measurements: Height: 5\' 7"  (170.2 cm) Weight: (!) 381 lb 6.3 oz (173 kg) IBW/kg (Calculated) : 61.6  Vital Signs: Temp: 98.2 F (36.8 C) (11/01 0542) Temp Source: Oral (11/01 0542) BP: 126/78 (11/01 0832) Pulse Rate: 88 (11/01 0832)  Labs: Recent Labs    02/02/19 1931 02/03/19 0209 02/04/19 0743 02/04/19 1445 02/05/19 0320  HGB  --  11.5* 9.6*  --  10.2*  HCT  --  35.7* 29.1*  --  30.0*  PLT  --  PLATELET CLUMPS NOTED ON SMEAR, UNABLE TO ESTIMATE PLATELET CLUMPS NOTED ON SMEAR, UNABLE TO ESTIMATE  --  129*  APTT 28  --   --   --   --   LABPROT 15.2  --   --  15.8* 17.9*  INR 1.2  --   --  1.3* 1.5*  CREATININE 1.64* 1.39* 1.05*  --  1.01*    Estimated Creatinine Clearance: 75.7 mL/min (A) (by C-G formula based on SCr of 1.01 mg/dL (H)).   Medical History: Past Medical History:  Diagnosis Date  . AKI (acute kidney injury) ([place]) 04/2016  . Anemia   . Arthritis   . Chronic atrial fibrillation ([place])   . Chronic edema   . Chronic venous insufficiency   . CKD (chronic kidney disease), stage III   . Coagulopathy ([place])   . Diabetes mellitus    Type 2  . Diverticulosis   . Dyslipidemia   . GERD (gastroesophageal reflux disease)   . GI bleed 2015   a. lower GI from diverticular source 2015.  [name] Hx of transfusion of packed red blood cells   . Hypertension   . Morbid obesity ([place])   . Obstructive sleep apnea    on CPAP  . Pulmonary hypertension ([place])   . Pyelonephritis 04/2016  . Renal infarct ([place])    a. 04/2016- adm with flank pain, ? pyelo vs renal infarct in setting of recent subtherapeutic INR.  [name] Sinus bradycardia   . Stroke [facility]) 2015   Assessment: 47 yof on warfarin PTA for afib/CVA initally  held in case ortho procedure. INR 1.2 on 10/29. Hgb 11.5 stable.  Resume warfarin 10/30.  - PTA dosing 10mg  WSS, 7.5mg  TTThF with admit INR 1.2  INR today is 1.5, received 10 mg last night. Hgb down slightly to 10.2, plt 120. No bleeding documented.   Goal of Therapy:  INR 2-3 Monitor platelets by anticoagulation protocol: Yes   Plan:  Coumadin 10mg  po x 1 tonight Daily INR Monitor daily CBC given downward trend   Acey Lav, PharmD  PGY1 [place] Resident [phone]  Please check AMION for all [place] phone numbers After 10:00 PM, call [place] [phone] 02/05/2019,9:15 AM

## 2019-02-05 NOTE — Evaluation (Signed)
Physical Therapy Evaluation Patient Details Name: Robin Snow MRN: [id number] DOB: 1940-12-17 Today's Date: 02/05/2019   History of Present Illness    78 y.o. female with history of atrial fibrillation, hypertension, diabetes mellitus, chronic kidney disease stage III admitted via ED after fall at home when patient was trying to get out of the bed.  Patient states she did not hit her head or lose consciousness.  She fell onto her right knee.  Patient as per the daughter with whom I spoke has been having chronic issues with the right knee and swelling which patient cannot place weight on. Xray R knee osteomyelitis, sepsis, evidence of chronic infection, referred for OP follow up per Ortho MD.   Clinical Impression  Pt presents to PT with severe restrictions to mobility due to pain, weakness, edema, limited ROM, resulting in need for moderate to maximal assistance for basic functional tasks, unable to move around or change positions with assist. Pt demonstrates normal cognitive function this date.  Pt would benefit from PT to address deficits with goal of returning home with family. Currently recommending SNF referral for shortterm rehab prior to d/c home, and will reassess d/c recommendations as pt improved in acute setting.     Follow Up Recommendations SNF    Equipment Recommendations       Recommendations for Other Services       Precautions / Restrictions Precautions Precautions: Fall Precaution Comments: knee pain RLE, obesity, weakness Restrictions Weight Bearing Restrictions: No      Mobility  Bed Mobility Overal bed mobility: Needs Assistance Bed Mobility: Supine to Sit     Supine to sit: Max assist;+2 for physical assistance;HOB elevated     General bed mobility comments: pt unable to repostion RLE, needs stepwise assist to advance limbs to L EOB and +2 assis to lift, support, guide trunk into sitting, and use of bed pad to pivot hips; pain in RLE limiting  mobility  Transfers Overall transfer level: Needs assistance Equipment used: Rolling walker (2 wheeled) Transfers: Sit to/from [employer] Sit to Stand: Min assist;+2 safety/equipment;From elevated surface         General transfer comment: bed raised to just above 90 degree at hips for improved leverage; increased time and pt directing pace and level of assist, with +2 for safety given cause of admission; pt needs min assist to stabilize while transitioning from hands on bed to hands on RW; able to stand for 3-4 minutes for pericare, including wide stance, then pivotal steps to recliner with min assist and RW(consider instruction on alternate hand placement w/RW)  Ambulation/Gait             General Gait Details: deferred this date given pain, increased effort, and concern for energy reserve  Stairs            Wheelchair Mobility    Modified Rankin (Stroke Patients Only)       Balance Overall balance assessment: History of Falls;Mild deficits observed, not formally tested                                           Pertinent Vitals/Pain Pain Assessment: Faces Faces Pain Scale: Hurts whole lot Pain Location: R knee Pain Descriptors / Indicators: Constant;Grimacing;Guarding;Sore;Aching Pain Intervention(s): Limited activity within patient's tolerance;Repositioned    Home Living Family/patient expects to be discharged to:: Private residence Living Arrangements: Children Available  Help at Discharge: Family;Available 24 hours/day Type of Home: House Home Access: Stairs to enter Entrance Stairs-Rails: None Entrance Stairs-Number of Steps: 3 Home Layout: Two level;Able to live on main level with bedroom/bathroom Home Equipment: Dan Humphreys - 2 wheels;Bedside commode;Shower seat;Wheelchair - manual      Prior Function Level of Independence: Needs assistance   Gait / Transfers Assistance Needed: uses RW for mobility and needs assist for  transfers  ADL's / Homemaking Assistance Needed: requires assist for bathing, dressing        Hand Dominance   Dominant Hand: Right    Extremity/Trunk Assessment   Upper Extremity Assessment Upper Extremity Assessment: Defer to OT evaluation    Lower Extremity Assessment Lower Extremity Assessment: RLE deficits/detail;LLE deficits/detail RLE Deficits / Details: edema, pain, limited ROM, unable to lift or reposition unassisted (max assist at least LLE Deficits / Details: more mobile vs. RLE, with some need for assist (self or PT) to lift or reposition (min assist at most)    Cervical / Trunk Assessment Cervical / Trunk Assessment: Other exceptions(obesity)  Communication   Communication: No difficulties  Cognition Arousal/Alertness: Awake/alert Behavior During Therapy: WFL for tasks assessed/performed Overall Cognitive Status: Within Functional Limits for tasks assessed                                 General Comments: A&Ox4 with some delay in retrieveing date, able to select month/year      General Comments General comments (skin integrity, edema, etc.): pt received wet with urine leaking from around purewick, pt cleaned and dressed in dry clothes, bed stripped, RN notified    Exercises     Assessment/Plan    PT Assessment Patient needs continued PT services  PT Problem List Pain;Obesity;Decreased knowledge of use of DME;Decreased mobility;Decreased balance;Decreased activity tolerance;Decreased range of motion;Decreased strength       PT Treatment Interventions Modalities;Patient/family education;Therapeutic exercise;Therapeutic activities;Functional mobility training;Stair training;Gait training;DME instruction    PT Goals (Current goals can be found in the Care Plan section)  Acute Rehab PT Goals Patient Stated Goal: no knee pain PT Goal Formulation: With patient Time For Goal Achievement: 02/19/19 Potential to Achieve Goals: Fair     Frequency Min 3X/week   Barriers to discharge   has lots of support at home but on her own some parts of the day; pt needs to be at/near min assist for all activities    Co-evaluation               AM-PAC PT "6 Clicks" Mobility  Outcome Measure Help needed turning from your back to your side while in a flat bed without using bedrails?: Total Help needed moving from lying on your back to sitting on the side of a flat bed without using bedrails?: Total Help needed moving to and from a bed to a chair (including a wheelchair)?: A Little Help needed standing up from a chair using your arms (e.g., wheelchair or bedside chair)?: A Lot Help needed to walk in hospital room?: A Lot Help needed climbing 3-5 steps with a railing? : Total 6 Click Score: 10    End of Session   Activity Tolerance: Patient limited by fatigue;Patient limited by pain Patient left: in chair;with call bell/phone within reach Nurse Communication: Mobility status;Precautions(+2 assist for back to bed) PT Visit Diagnosis: Pain;Difficulty in walking, not elsewhere classified (R26.2);History of falling (Z91.81);Muscle weakness (generalized) (M62.81) Pain - Right/Left: Right Pain - part  of body: Knee    Time: 0955-1030 PT Time Calculation (min) (ACUTE ONLY): 35 min   Charges:   PT Evaluation $PT Eval Low Complexity: 1 Low          Narda Amber, PT, DPT, MS Board Certified Geriatric Clinical Specialist  Dennis Bast 02/05/2019, 10:54 AM

## 2019-02-05 NOTE — Evaluation (Signed)
Occupational Therapy Evaluation Patient Details Name: Robin DutyBarbara P Pennywell MRN: [id number] DOB: 04-05-1941 Today's Date: 02/05/2019    History of Present Illness 78 y.o. female with history of atrial fibrillation, hypertension, diabetes mellitus, chronic kidney disease stage III admitted via ED after fall at home when patient was trying to get out of the bed.  Patient states she did not hit her head or lose consciousness.  She fell onto her right knee.  Patient as per the daughter with whom I spoke has been having chronic issues with the right knee and swelling which patient cannot place weight on. Xray R knee osteomyelitis, sepsis, evidence of chronic infection, referred for OP follow up per Ortho MD.   Clinical Impression   PTA patient reports using RW for mobility with some assist for transfers, mobility and ADLs.  Admitted for above and limited by problem list below, including R knee pain and limited ROM/edema, impaired balance, decreased activity tolerance, generalized weakness.  She currently requires mod assist for toileting, min assist +2 for transfers, max assist +2 for bed mobility, and total assist +2 for LB ADLs. Cognitively, pt pleasant and oriented (given choices for time), following commands with increased time.  Patient will benefit from continued OT services while admitted and after dc at SNF level in order to optimize independence and safety with ADLs/mobility.     Follow Up Recommendations  SNF;Supervision/Assistance - 24 hour    Equipment Recommendations  Other (comment)(TBD at next venue of care)    Recommendations for Other Services       Precautions / Restrictions Precautions Precautions: Fall Precaution Comments: knee pain RLE, obesity, weakness Restrictions Weight Bearing Restrictions: No      Mobility Bed Mobility Overal bed mobility: Needs Assistance Bed Mobility: Supine to Sit     Supine to sit: Max assist;+2 for physical assistance;HOB elevated      General bed mobility comments: pt unable to repostion RLE, needs stepwise assist to advance limbs to L EOB and +2 assis to lift, support, guide trunk into sitting, and use of bed pad to pivot hips; pain in RLE limiting mobility  Transfers Overall transfer level: Needs assistance Equipment used: Rolling walker (2 wheeled) Transfers: Sit to/from [employer];Stand Pivot Transfers Sit to Stand: Min assist;+2 safety/equipment;From elevated surface Stand pivot transfers: Min assist;+2 physical assistance;+2 safety/equipment       General transfer comment: from elevated EOB requires increased time and effort to power up and steady with min assist +2 , then pivoting to recliner with min assist +2     Balance Overall balance assessment: Needs assistance;History of Falls Sitting-balance support: No upper extremity supported;Feet supported Sitting balance-Leahy Scale: Fair     Standing balance support: During functional activity;Bilateral upper extremity supported;Single extremity supported Standing balance-Leahy Scale: Poor Standing balance comment: relaint on at least 1 UE support statically, BUE dynaimcally                           ADL either performed or assessed with clinical judgement   ADL Overall ADL's : Needs assistance/impaired     Grooming: Set up;Sitting   Upper Body Bathing: Minimal assistance;Sitting   Lower Body Bathing: Moderate assistance;Sit to/from stand;+2 for physical assistance   Upper Body Dressing : Minimal assistance;Sitting   Lower Body Dressing: Maximal assistance;Sit to/from stand;+2 for physical assistance Lower Body Dressing Details (indicate cue type and reason): decreased reach and limited ROM of R LE; impaired balance and +2 min assist sit to  stand  Toilet Transfer: Minimal assistance;+2 for physical assistance;RW;Stand-pivot Toilet Transfer Details (indicate cue type and reason): simulated to recliner  Toileting- Clothing Manipulation and  Hygiene: +2 for physical assistance;Sit to/from stand;Moderate assistance Toileting - Clothing Manipulation Details (indicate cue type and reason): hygiene in standing after purewick leaking     Functional mobility during ADLs: Minimal assistance;+2 for physical assistance;+2 for safety/equipment;Rolling walker General ADL Comments: pt limited by balance, weakness, decreased activity tolerance     Vision         Perception     Praxis      Pertinent Vitals/Pain Pain Assessment: Faces Faces Pain Scale: Hurts whole lot Pain Location: R knee Pain Descriptors / Indicators: Constant;Grimacing;Guarding;Sore;Aching Pain Intervention(s): Limited activity within patient's tolerance;Repositioned;Monitored during session     Hand Dominance Right   Extremity/Trunk Assessment Upper Extremity Assessment Upper Extremity Assessment: Overall WFL for tasks assessed   Lower Extremity Assessment Lower Extremity Assessment: Defer to PT evaluation RLE Deficits / Details: edema, pain, limited ROM, unable to lift or reposition unassisted (max assist at least LLE Deficits / Details: more mobile vs. RLE, with some need for assist (self or PT) to lift or reposition (min assist at most)   Cervical / Trunk Assessment Cervical / Trunk Assessment: Other exceptions Cervical / Trunk Exceptions: body habitus    Communication Communication Communication: No difficulties   Cognition Arousal/Alertness: Awake/alert Behavior During Therapy: WFL for tasks assessed/performed Overall Cognitive Status: Within Functional Limits for tasks assessed                                 General Comments: alert and oriented, slow processing but appears [facility]    General Comments  pt received wet with urine leaking from around purewick, pt cleaned and dressed in dry clothes, bed stripped, RN notified    Exercises     Shoulder Instructions      Home Living Family/patient expects to be discharged to::  Private residence Living Arrangements: Children Available Help at Discharge: Family;Available 24 hours/day Type of Home: House Home Access: Stairs to enter [employer] of Steps: 3 Entrance Stairs-Rails: None Home Layout: Two level;Able to live on main level with bedroom/bathroom     Bathroom Shower/Tub: Chief Strategy Officer: Standard Bathroom Accessibility: Yes   Home Equipment: Walker - 2 wheels;Bedside commode;Shower seat;Wheelchair - manual          Prior Functioning/Environment Level of Independence: Needs assistance  Gait / Transfers Assistance Needed: uses RW for mobility and needs assist for transfers ADL's / Homemaking Assistance Needed: requires assist for bathing, dressing            OT Problem List: Decreased strength;Decreased activity tolerance;Impaired balance (sitting and/or standing);Decreased range of motion;Decreased knowledge of use of DME or AE;Decreased knowledge of precautions;Obesity;Pain;Increased edema      OT Treatment/Interventions: Self-care/ADL training;DME and/or AE instruction;Patient/family education;Balance training;Therapeutic activities;Therapeutic exercise;Energy conservation    OT Goals(Current goals can be found in the care plan section) Acute Rehab OT Goals Patient Stated Goal: no knee pain OT Goal Formulation: With patient Time For Goal Achievement: 02/19/19 Potential to Achieve Goals: Good  OT Frequency: Min 2X/week   Barriers to D/C:            Co-evaluation PT/OT/SLP Co-Evaluation/Treatment: Yes Reason for Co-Treatment: To address functional/ADL transfers;For patient/therapist safety   OT goals addressed during session: ADL's and self-care      AM-PAC OT "6 Clicks" Daily  Activity     Outcome Measure Help from another person eating meals?: A Little Help from another person taking care of personal grooming?: A Little Help from another person toileting, which includes using toliet, bedpan, or  urinal?: A Lot Help from another person bathing (including washing, rinsing, drying)?: A Lot Help from another person to put on and taking off regular upper body clothing?: A Little Help from another person to put on and taking off regular lower body clothing?: Total 6 Click Score: 14   End of Session Equipment Utilized During Treatment: Rolling walker Nurse Communication: Mobility status;Precautions  Activity Tolerance: Patient tolerated treatment well Patient left: in chair;with call bell/phone within reach  OT Visit Diagnosis: Other abnormalities of gait and mobility (R26.89);Muscle weakness (generalized) (M62.81);Pain Pain - Right/Left: Right Pain - part of body: Knee                Time: 6237-6283 OT Time Calculation (min): 31 min Charges:  OT General Charges $OT Visit: 1 Visit OT Evaluation $OT Eval Moderate Complexity: [place], OT Acute Rehabilitation Services Pager [phone] Office [phone]    Delight Stare 02/05/2019, 11:56 AM

## 2019-02-05 NOTE — Progress Notes (Addendum)
PROGRESS NOTE  Robin Snow ZOX:[id number] DOB: [birth year] DOA: 02/02/2019 PCP: [name], MD  HPI/Recap of past 24 hours: Robin Snow a 78 y.o.femalewithhistory of atrial fibrillation, hypertension, diabetes mellitus, chronic kidney disease stage III had a fall at home when patient was trying to get out of the bed. Patient states she did not hit her head or lose consciousness. She fell onto her right knee. Patient as per the daughter with whom I spoke has been having chronic issues with the right knee and swelling which patient cannot place weight on. Given the fall and increasing pain in the right knee patient was brought to the ER.  ED Course:In the ER patient was febrile with temperature of 101.5 F lab work show WBC count of 21.8 hemoglobin 11.1 platelets 147 creatinine 1.6 lactic acid was 2.1 which improved to 1.6 CRP was 19.7 UA was consistent with UTI. Right knee was showing features concerning for osteomyelitis and septic arthritis. On-call orthopedic surgeon Dr. Jena Gauss has been consulted will be seeing patient in consult. Chest x-ray was showing chronic changes. Patient was empirically started on antibiotic for sepsis. At presentation patient was in A. fib with RVR and rate improved with IV fluid bolus.  Seen by orthopedic surgery no planned procedures.  She will need to follow-up with her primary surgeon in [place].  Blood cultures x2 drawn on 02/02/2019 + for E. coli.  IV antibiotics appropriately de-escalated to ceftriaxone.  Blood cultures x2 repeated on 02/04/2019.   02/05/19: Patient was seen and examined at her bedside this morning.  She is alert but confused.  No acute events overnight.  On Rocephin for E. coli bacteremia.  PT OT assessment recommended SNF with 24-hour supervision assistance.  CSW consulted to assist with placement.   Assessment/Plan: Principal Problem:   Sepsis (HCC) Active Problems:   Essential hypertension  Diabetes mellitus with complication (HCC)   CKD (chronic kidney disease) stage 3, GFR 30-59 ml/min   Chronic diastolic CHF (congestive heart failure) (HCC)   Warm antibody hemolytic anemia   Atrial fibrillation with RVR (HCC)  Sepsis secondary to E. coli bacteremia  Presented with a fever, tachycardia and tachypnea, blood culture drawn on 02/02/2019 + E. coli Repeated blood cultures x2 peripherally on 02/04/2019 Continue to follow cultures Sensitivities show resistance to ampicillin, Unasyn and Bactrim Continue Rocephin, sensitive Sepsis parameters improving Continue to monitor fever curve and WBC Repeat CBC with differentials in the morning  Acute metabolic encephalopathy likely in the setting of acute illness  CT head done on 02/02/2019 showed negative for acute intracranial hemorrhage or mass possible age indeterminate lacunar infarct right cerebellum.  Atrophy and small vessel ischemic changes of the white matter. Continue to reorient as needed Continue fall precautions  Chronic right knee pain Seen by orthopedic surgery, no planned procedures. Follow-up with own orthopedic surgeon Dr. Turner Daniels at discharge  Presumed UTI Urine culture taken on 02/02/2019 unrevealing Continue Rocephin empirically  Permanent A. fib with RVR. RVR is improved.    Rate is currently controlled on diltiazem and Lopressor.  On Coumadin for CVA prevention.  INR subtherapeutic.  Pharmacy managing Coumadin.  Subtherapeutic INR Coumadin INR 1.5 improved from 1.2 yesterday Pharmacy assisting with Coumadin management.  Appreciate assistance.  Type II diabetes mellitus type, tightly controlled.  A1c 5.8 on 02/03/2019.  Avoid hypoglycemia.  Hypertension on beta-blockers Cardizem and lisinopril.  Note that patient has chronic kidney disease.  Holding hydrochlorothiazide since patient is receiving fluids.  Blood pressure is at goal.  Continue current management.  AKI on Chronic kidney disease stage II  creatinine appears to be at baseline 1.0 with GFR >60 on 02/05/19. Presented with Cr. 1.39 and GFR 42. Note that patient is on lisinopril.  Closely monitor urine output.  Continue to closely monitor renal function.  Chronic normocytic anemia and thrombocytopenia with history of autoimmune hemolytic anemia follow CBC closely.  Patient is on prednisone (confirmed with patient's daughter).  Will give stress dose steroids.  Hemoglobin 9.6 on 02/04/2019 from 11.5 on 02/03/2019. Hemoglobin 10.2 on 02/05/2019 from 9.6 on 02/04/2019.  Hyperlipidemia: Continue on statins.  Physical therapy PT OT recommended SNF with 24-hour assistance supervision CSW consulted to assist with placement. Continue fall precautions.  Severe morbid obesity BMI 59 Recommend weight loss outpatient with regular physical activity as tolerated and healthy dieting.    DVT prophylaxis: Coumadin Code Status: Full code. Family Communication: None at bedside.  Disposition Plan:  Possible discharge in the next 24 to 48 hours pending PT OT evaluation.   Consults called: Orthopedics.   Objective: Vitals:   02/04/19 2139 02/05/19 0542 02/05/19 0832 02/05/19 1229  BP: (!) 122/92 114/71 126/78 128/77  Pulse: 62 (!) 104 88 86  Resp: 16 14 17 18   Temp: (!) 97.5 F (36.4 C) 98.2 F (36.8 C) 98.6 F (37 C) 97.6 F (36.4 C)  TempSrc: Oral Oral Oral Oral  SpO2: 97% 97% 96% 100%  Weight:  (!) 173 kg    Height:        Intake/Output Summary (Last 24 hours) at 02/05/2019 1231 Last data filed at 02/05/2019 [id number] Gross per 24 hour  Intake 2742.39 ml  Output 450 ml  Net 2292.39 ml   Filed Weights   02/02/19 1922 02/03/19 0506 02/05/19 0542  Weight: 81.6 kg 95 kg (!) 173 kg    Exam:  . General: 78 y.o. year-old female severe morbid obesity.  Alert but confused.  No acute distress. . Cardiovascular: Irregular rate and rhythm no rubs or gallops.  No JVD or thyromegaly.   [name]. Respiratory: Clear to station no wheezes or  rales.   . Abdomen: Obese nontender nondistended bowel sounds present.  . Musculoskeletal: 1+ pitting edema lower extremities bilaterally.  Hyperpigmented skin in the lower extremities bilaterally . Psychiatry: Mood is appropriate for condition and setting.   Data Reviewed: CBC: Recent Labs  Lab 02/02/19 1923 02/03/19 0209 02/04/19 0743 02/05/19 0320  WBC 21.8* 20.5* 19.6* 17.8*  NEUTROABS 17.4*  --   --   --   HGB 11.1* 11.5* 9.6* 10.2*  HCT 33.1* 35.7* 29.1* 30.0*  MCV 84.0 86.4 84.3 82.0  PLT 147* PLATELET CLUMPS NOTED ON SMEAR, UNABLE TO ESTIMATE PLATELET CLUMPS NOTED ON SMEAR, UNABLE TO ESTIMATE 129*   Basic Metabolic Panel: Recent Labs  Lab 02/02/19 1931 02/03/19 0209 02/03/19 1157 02/04/19 0743 02/05/19 0320  NA 142 141  --  143 141  K 3.7 5.8* 3.4* 3.6 3.5  CL 105 105  --  108 110  CO2 25 24  --  21* 22  GLUCOSE 142* 121*  --  88 97  BUN 31* 30*  --  21 20  CREATININE 1.64* 1.39*  --  1.05* 1.01*  CALCIUM 8.9 8.5*  --  8.4* 8.3*  MG 2.1  --   --   --   --    GFR: Estimated Creatinine Clearance: 75.7 mL/min (A) (by C-G formula based on SCr of 1.01 mg/dL (H)). Liver Function Tests: Recent Labs  Lab 02/02/19 1931  02/04/19 0743  AST 28 15  ALT 16 13  ALKPHOS 91 79  BILITOT 1.4* 0.9  PROT 7.6 6.5  ALBUMIN 2.8* 2.2*   No results for input(s): LIPASE, AMYLASE in the last 168 hours. No results for input(s): AMMONIA in the last 168 hours. Coagulation Profile: Recent Labs  Lab 02/02/19 1931 02/04/19 1445 02/05/19 0320  INR 1.2 1.3* 1.5*   Cardiac Enzymes: No results for input(s): CKTOTAL, CKMB, CKMBINDEX, TROPONINI in the last 168 hours. BNP (last 3 results) No results for input(s): PROBNP in the last 8760 hours. HbA1C: Recent Labs    02/03/19 0209  HGBA1C 5.8*   CBG: Recent Labs  Lab 02/04/19 1215 02/04/19 1700 02/04/19 2136 02/05/19 0742 02/05/19 1226  GLUCAP 135* 115* 151* 127* 146*   Lipid Profile: No results for input(s): CHOL,  HDL, LDLCALC, TRIG, CHOLHDL, LDLDIRECT in the last 72 hours. Thyroid Function Tests: No results for input(s): TSH, T4TOTAL, FREET4, T3FREE, THYROIDAB in the last 72 hours. Anemia Panel: No results for input(s): VITAMINB12, FOLATE, FERRITIN, TIBC, IRON, RETICCTPCT in the last 72 hours. Urine analysis:    Component Value Date/Time   COLORURINE YELLOW 02/02/2019 1923   APPEARANCEUR CLEAR 02/02/2019 1923   LABSPEC 1.013 02/02/2019 1923   PHURINE 5.0 02/02/2019 1923   GLUCOSEU NEGATIVE 02/02/2019 1923   GLUCOSEU NEGATIVE 09/06/2017 1200   HGBUR SMALL (A) 02/02/2019 1923   BILIRUBINUR NEGATIVE 02/02/2019 1923   BILIRUBINUR Neg 10/18/2017 1453   KETONESUR NEGATIVE 02/02/2019 1923   PROTEINUR NEGATIVE 02/02/2019 1923   UROBILINOGEN 0.2 10/18/2017 1453   UROBILINOGEN 1.0 09/06/2017 1200   NITRITE POSITIVE (A) 02/02/2019 1923   LEUKOCYTESUR LARGE (A) 02/02/2019 1923   Sepsis Labs: (procalcitonin:4,lacticidven:4)  ) Recent Results (from the past 240 hour(s))  Blood culture (routine x 2)     Status: Abnormal   Collection Time: 02/02/19  8:00 PM   Specimen: BLOOD RIGHT ARM  Result Value Ref Range Status   Specimen Description BLOOD RIGHT ARM  Final   Special Requests   Final    BOTTLES DRAWN AEROBIC AND ANAEROBIC Blood Culture adequate volume   Culture  Setup Time   Final    GRAM NEGATIVE RODS IN BOTH AEROBIC AND ANAEROBIC BOTTLES CRITICAL RESULT CALLED TO, READ BACK BY AND VERIFIED WITHKelby Fam PHARMD 1434 02/03/19 A BROWNING Performed at [facility], [address] [place], [place] [address]    Culture ESCHERICHIA COLI (A)  Final   Report Status 02/05/2019 FINAL  Final   Organism ID, Bacteria ESCHERICHIA COLI  Final      Susceptibility   Escherichia coli - MIC*    AMPICILLIN >=32 RESISTANT Resistant     CEFAZOLIN <=4 SENSITIVE Sensitive     CEFEPIME <=1 SENSITIVE Sensitive     CEFTAZIDIME <=1 SENSITIVE Sensitive     CEFTRIAXONE <=1 SENSITIVE Sensitive      CIPROFLOXACIN <=0.25 SENSITIVE Sensitive     GENTAMICIN <=1 SENSITIVE Sensitive     IMIPENEM <=0.25 SENSITIVE Sensitive     TRIMETH/SULFA >=320 RESISTANT Resistant     AMPICILLIN/SULBACTAM >=32 RESISTANT Resistant     PIP/TAZO <=4 SENSITIVE Sensitive     Extended ESBL NEGATIVE Sensitive     * ESCHERICHIA COLI  Blood Culture ID Panel (Reflexed)     Status: Abnormal   Collection Time: 02/02/19  8:00 PM  Result Value Ref Range Status   Enterococcus species NOT DETECTED NOT DETECTED Final   Listeria monocytogenes NOT DETECTED NOT DETECTED Final   Staphylococcus  species NOT DETECTED NOT DETECTED Final   Staphylococcus aureus (BCID) NOT DETECTED NOT DETECTED Final   Streptococcus species NOT DETECTED NOT DETECTED Final   Streptococcus agalactiae NOT DETECTED NOT DETECTED Final   Streptococcus pneumoniae NOT DETECTED NOT DETECTED Final   Streptococcus pyogenes NOT DETECTED NOT DETECTED Final   Acinetobacter baumannii NOT DETECTED NOT DETECTED Final   Enterobacteriaceae species DETECTED (A) NOT DETECTED Final    Comment: Enterobacteriaceae represent a large family of gram-negative bacteria, not a single organism. CRITICAL RESULT CALLED TO, READ BACK BY AND VERIFIED WITH: Tillman Sers PHARMD 1434 02/03/19 A BROWNING    Enterobacter cloacae complex NOT DETECTED NOT DETECTED Final   Escherichia coli DETECTED (A) NOT DETECTED Final    Comment: CRITICAL RESULT CALLED TO, READ BACK BY AND VERIFIED WITH: Tillman Sers PHARMD 1434 02/03/19 A BROWNING    Klebsiella oxytoca NOT DETECTED NOT DETECTED Final   Klebsiella pneumoniae NOT DETECTED NOT DETECTED Final   Proteus species NOT DETECTED NOT DETECTED Final   Serratia marcescens NOT DETECTED NOT DETECTED Final   Carbapenem resistance NOT DETECTED NOT DETECTED Final   Haemophilus influenzae NOT DETECTED NOT DETECTED Final   Neisseria meningitidis NOT DETECTED NOT DETECTED Final   Pseudomonas aeruginosa NOT DETECTED NOT DETECTED Final   Candida albicans  NOT DETECTED NOT DETECTED Final   Candida glabrata NOT DETECTED NOT DETECTED Final   Candida krusei NOT DETECTED NOT DETECTED Final   Candida parapsilosis NOT DETECTED NOT DETECTED Final   Candida tropicalis NOT DETECTED NOT DETECTED Final    Comment: Performed at [facility], [place] [address], [place], [place] [address]  Blood culture (routine x 2)     Status: None (Preliminary result)   Collection Time: 02/02/19  8:05 PM   Specimen: BLOOD LEFT ARM  Result Value Ref Range Status   Specimen Description BLOOD LEFT ARM  Final   Special Requests   Final    BOTTLES DRAWN AEROBIC AND ANAEROBIC Blood Culture adequate volume   Culture  Setup Time   Final    IN BOTH AEROBIC AND ANAEROBIC BOTTLES GRAM NEGATIVE RODS CRITICAL VALUE NOTED.  VALUE IS CONSISTENT WITH PREVIOUSLY REPORTED AND CALLED VALUE. Performed at [facility], [place] [address], [place], [place] [address]    Culture GRAM NEGATIVE RODS  Final   Report Status PENDING  Incomplete  Urine culture     Status: Abnormal   Collection Time: 02/02/19  8:36 PM   Specimen: In/Out Cath Urine  Result Value Ref Range Status   Specimen Description IN/OUT CATH URINE  Final   Special Requests   Final    NONE Performed at [facility], [place] [address], [place], [place] [address]    Culture MULTIPLE SPECIES PRESENT, SUGGEST RECOLLECTION (A)  Final   Report Status 02/03/2019 FINAL  Final  SARS CORONAVIRUS 2 (TAT 6-24 HRS) Nasopharyngeal Nasopharyngeal Swab     Status: None   Collection Time: 02/02/19 10:03 PM   Specimen: Nasopharyngeal Swab  Result Value Ref Range Status   SARS Coronavirus 2 NEGATIVE NEGATIVE Final    Comment: (NOTE) SARS-CoV-2 target nucleic acids are NOT DETECTED. The SARS-CoV-2 RNA is generally detectable in upper and lower respiratory specimens during the acute phase of infection. Negative results do not preclude SARS-CoV-2 infection, do not rule out co-infections with other pathogens, and should not be  used as the sole basis for treatment or other patient management decisions. Negative results must be combined with clinical observations, patient  history, and epidemiological information. The expected result is Negative. Fact Sheet for Patients: HairSlick.no Fact Sheet for Healthcare Providers: quierodirigir.com This test is not yet approved or cleared by the [place] FDA and  has been authorized for detection and/or diagnosis of SARS-CoV-2 by FDA under an Emergency Use Authorization (EUA). This EUA will remain  in effect (meaning this test can be used) for the duration of the COVID-19 declaration under Section 56 4(b)(1) of the Act, 21 U.S.C. section 360bbb-3(b)(1), unless the authorization is terminated or revoked sooner. Performed at [facility], [address] [place] [address] [place], [place] [address]   MRSA PCR Screening     Status: None   Collection Time: 02/03/19  5:28 AM   Specimen: Nasal Mucosa; Nasopharyngeal  Result Value Ref Range Status   MRSA by PCR NEGATIVE NEGATIVE Final    Comment:        The GeneXpert MRSA Assay (FDA approved for NASAL specimens only), is one component of a comprehensive MRSA colonization surveillance program. It is not intended to diagnose MRSA infection nor to guide or monitor treatment for MRSA infections. Performed at [facility], [address] [place], [place] [address]   Culture, blood (routine x 2)     Status: None (Preliminary result)   Collection Time: 02/04/19  7:40 AM   Specimen: BLOOD RIGHT HAND  Result Value Ref Range Status   Specimen Description BLOOD RIGHT HAND  Final   Special Requests   Final    BOTTLES DRAWN AEROBIC ONLY Blood Culture results may not be optimal due to an inadequate volume of blood received in culture bottles   Culture   Final    NO GROWTH < 24 HOURS Performed at [facility], [address], [place], [place] [address]    Report Status  PENDING  Incomplete  Culture, blood (routine x 2)     Status: None (Preliminary result)   Collection Time: 02/04/19 10:17 AM   Specimen: BLOOD RIGHT HAND  Result Value Ref Range Status   Specimen Description BLOOD RIGHT HAND  Final   Special Requests   Final    BOTTLES DRAWN AEROBIC ONLY Blood Culture results may not be optimal due to an inadequate volume of blood received in culture bottles   Culture   Final    NO GROWTH < 24 HOURS Performed at [facility], [address] [place], [place] [address]    Report Status PENDING  Incomplete      Studies: No results found.  Scheduled Meds: . diltiazem  180 mg Oral Daily  . ferrous sulfate  325 mg Oral BID  . gabapentin  300 mg Oral TID  . insulin aspart  0-9 Units Subcutaneous TID WC  . lisinopril  20 mg Oral Daily  . metoprolol tartrate  50 mg Oral BID  . pravastatin  20 mg Oral q1800  . predniSONE  5 mg Oral Q breakfast  . warfarin  10 mg Oral ONCE-1800  . Warfarin - Pharmacist Dosing Inpatient   Does not apply q1800    Continuous Infusions: . sodium chloride 1,000 mL (02/05/19 0210)  . cefTRIAXone (ROCEPHIN)  IV 2 g (02/04/19 2107)     LOS: 3 days     Darlin Drop, MD [facility] Pager [phone]  If 7PM-7AM, please contact night-coverage [email or web address] Password [facility] 02/05/2019, 12:31 PM

## 2019-02-06 LAB — CULTURE, BLOOD (ROUTINE X 2): Special Requests: ADEQUATE

## 2019-02-06 LAB — PROTIME-INR
INR: 2.5 — ABNORMAL HIGH (ref 0.8–1.2)
Prothrombin Time: 26.4 seconds — ABNORMAL HIGH (ref 11.4–15.2)

## 2019-02-06 LAB — CBC WITH DIFFERENTIAL/PLATELET
Abs Immature Granulocytes: 1.02 10*3/uL — ABNORMAL HIGH (ref 0.00–0.07)
Basophils Absolute: 0.1 10*3/uL (ref 0.0–0.1)
Basophils Relative: 0 %
Eosinophils Absolute: 0.1 10*3/uL (ref 0.0–0.5)
Eosinophils Relative: 0 %
HCT: 27.9 % — ABNORMAL LOW (ref 36.0–46.0)
Hemoglobin: 9.5 g/dL — ABNORMAL LOW (ref 12.0–15.0)
Immature Granulocytes: 6 %
Lymphocytes Relative: 13 %
Lymphs Abs: 2.3 10*3/uL (ref 0.7–4.0)
MCH: 27.9 pg (ref 26.0–34.0)
MCHC: 34.1 g/dL (ref 30.0–36.0)
MCV: 81.8 fL (ref 80.0–100.0)
Monocytes Absolute: 1.2 10*3/uL — ABNORMAL HIGH (ref 0.1–1.0)
Monocytes Relative: 7 %
Neutro Abs: 13.2 10*3/uL — ABNORMAL HIGH (ref 1.7–7.7)
Neutrophils Relative %: 74 %
Platelets: 160 10*3/uL (ref 150–400)
RBC: 3.41 MIL/uL — ABNORMAL LOW (ref 3.87–5.11)
RDW: 16.3 % — ABNORMAL HIGH (ref 11.5–15.5)
WBC: 17.7 10*3/uL — ABNORMAL HIGH (ref 4.0–10.5)
nRBC: 0.1 % (ref 0.0–0.2)

## 2019-02-06 LAB — GLUCOSE, CAPILLARY
Glucose-Capillary: 84 mg/dL (ref 70–99)
Glucose-Capillary: 136 mg/dL — ABNORMAL HIGH (ref 70–99)
Glucose-Capillary: 143 mg/dL — ABNORMAL HIGH (ref 70–99)
Glucose-Capillary: 110 mg/dL — ABNORMAL HIGH (ref 70–99)

## 2019-02-06 MED ORDER — CEFAZOLIN SODIUM-DEXTROSE 2-4 GM/100ML-% IV SOLN
2.0000 g | Freq: Three times a day (TID) | INTRAVENOUS | Status: DC
  Administered 2019-02-06 – 2019-02-08 (×4): 2 g via INTRAVENOUS
  Filled 2019-02-06 (×9): qty 100

## 2019-02-06 MED ORDER — WHITE PETROLATUM EX OINT
TOPICAL_OINTMENT | CUTANEOUS | Status: AC
  Administered 2019-02-06: 13:00:00
  Filled 2019-02-06: qty 28.35

## 2019-02-06 NOTE — Progress Notes (Signed)
Physical Therapy Treatment Patient Details Name: Robin Snow MRN: [id number] DOB: [birth year] Today's Date: 02/06/2019    History of Present Illness 78 y.o. female with history of atrial fibrillation, hypertension, diabetes mellitus, chronic kidney disease stage III admitted via ED after fall at home when patient was trying to get out of the bed.  Patient states she did not hit her head or lose consciousness.  She fell onto her right knee.  Patient as per the daughter with whom I spoke has been having chronic issues with the right knee and swelling which patient cannot place weight on. Xray R knee osteomyelitis, sepsis, evidence of chronic infection, referred for OP follow up per Ortho MD.    PT Comments    Patient received in bed, agrees to PT session. Wants to get home to bake pies. Slow with mobility, but able to do most independently. Requires assistance for bed position management to assist with mobility. Supervision for safety. Limited by pain in right knee. Patient is able to stand from elevated surface with increased time and effort. She ambulated 15 feet with RW and min guard. Patient will continue to benefit from skilled PT to improve mobility and strength.    Follow Up Recommendations  Home health PT - patient reports she has ramp at back of home.     Equipment Recommendations  Wheelchair (measurements PT);Wheelchair cushion (measurements PT)    Recommendations for Other Services       Precautions / Restrictions Precautions Precautions: Fall Precaution Comments: knee pain RLE, obesity, weakness Restrictions Weight Bearing Restrictions: No    Mobility  Bed Mobility Overal bed mobility: Needs Assistance Bed Mobility: Supine to Sit     Supine to sit: Min assist;HOB elevated     General bed mobility comments: requires increased time to perform mobility however does most of the movement with mod. independent  Transfers Overall transfer level: Needs  assistance Equipment used: Rolling walker (2 wheeled) Transfers: Sit to/from Stand Sit to Stand: Supervision;Min guard         General transfer comment: from elevated surface, patient requires increased time and effort to perform transfer. Supervision for safety.  Ambulation/Gait Ambulation/Gait assistance: Min guard Gait Distance (Feet): 15 Feet Assistive device: Rolling walker (2 wheeled) Gait Pattern/deviations: Step-to pattern;Decreased stride length;Trunk flexed Gait velocity: decreased   General Gait Details: patient able to ambulate with slow steady pace, supervision to min guard for safety   Stairs             Wheelchair Mobility    Modified Rankin (Stroke Patients Only)       Balance Overall balance assessment: Needs assistance Sitting-balance support: Feet supported Sitting balance-Leahy Scale: Good     Standing balance support: Bilateral upper extremity supported;During functional activity Standing balance-Leahy Scale: Fair                              Cognition Arousal/Alertness: Awake/alert Behavior During Therapy: WFL for tasks assessed/performed Overall Cognitive Status: Within Functional Limits for tasks assessed                                 General Comments: alert and oriented, slow processing but appears [facility]       Exercises      General Comments        Pertinent Vitals/Pain Pain Assessment: 0-10 Pain Score: 8  Pain Location: R knee  Pain Descriptors / Indicators: Grimacing;Guarding;Discomfort;Sore;Aching Pain Intervention(s): Limited activity within patient's tolerance;Monitored during session;Repositioned    Home Living                      Prior Function            PT Goals (current goals can now be found in the care plan section) Acute Rehab PT Goals Patient Stated Goal: decrease pain, get home to bake pies PT Goal Formulation: With patient Time For Goal Achievement:  02/19/19 Potential to Achieve Goals: Fair Progress towards PT goals: Progressing toward goals    Frequency    Min 3X/week      PT Plan Current plan remains appropriate    Co-evaluation              AM-PAC PT "6 Clicks" Mobility   Outcome Measure  Help needed turning from your back to your side while in a flat bed without using bedrails?: A Lot Help needed moving from lying on your back to sitting on the side of a flat bed without using bedrails?: A Little Help needed moving to and from a bed to a chair (including a wheelchair)?: A Little Help needed standing up from a chair using your arms (e.g., wheelchair or bedside chair)?: A Little Help needed to walk in hospital room?: A Little Help needed climbing 3-5 steps with a railing? : A Lot 6 Click Score: 16    End of Session Equipment Utilized During Treatment: Gait belt Activity Tolerance: Patient tolerated treatment well;Patient limited by pain Patient left: in chair;with call bell/phone within reach Nurse Communication: Mobility status PT Visit Diagnosis: Difficulty in walking, not elsewhere classified (R26.2);Pain;Other abnormalities of gait and mobility (R26.89);History of falling (Z91.81) Pain - Right/Left: Right Pain - part of body: Knee     Time: 6387-5643 PT Time Calculation (min) (ACUTE ONLY): 31 min  Charges:  $Gait Training: 23-37 mins                     Liv Rallis, PT, GCS 02/06/19,10:32 AM

## 2019-02-06 NOTE — Progress Notes (Signed)
PROGRESS NOTE  Robin Snow NWG:[id number] DOB: 1941/01/28 DOA: 02/02/2019 PCP: [name], MD  HPI/Recap of past 24 hours: Robin Snow a 78 y.o.femalewithhistory of atrial fibrillation, hypertension, diabetes mellitus, chronic kidney disease stage III had a fall at home when patient was trying to get out of the bed. Patient states she did not hit her head or lose consciousness. She fell onto her right knee. Patient as per the daughter with whom I spoke has been having chronic issues with the right knee and swelling which patient cannot place weight on. Given the fall and increasing pain in the right knee patient was brought to the ER.  ED Course:In the ER patient was febrile with temperature of 101.5 F lab work show WBC count of 21.8 hemoglobin 11.1 platelets 147 creatinine 1.6 lactic acid was 2.1 which improved to 1.6 CRP was 19.7 UA was consistent with UTI. Right knee was showing features concerning for osteomyelitis and septic arthritis. On-call orthopedic surgeon Dr. Jena Gauss has been consulted will be seeing patient in consult. Chest x-ray was showing chronic changes. Patient was empirically started on antibiotic for sepsis. At presentation patient was in A. fib with RVR and rate improved with IV fluid bolus.  Seen by orthopedic surgery no planned procedures.  She will need to follow-up with her primary surgeon in [place].  Blood cultures x2 drawn on 02/02/2019 + for E. coli.  IV antibiotics appropriately de-escalated to ceftriaxone.  Blood cultures x2 repeated on 02/04/2019.   02/05/19: Patient was seen and examined at her bedside this morning.  She is alert but confused.  No acute events overnight.  On Rocephin for E. coli bacteremia.  PT OT assessment recommended SNF with 24-hour supervision assistance.  CSW consulted to assist with placement.  02/06/19: Patient was seen and examined at her bedside this morning.  No acute events overnight.  She has no  new complaints.  Right knee pain is worse when she moves.  She will need to follow-up with her orthopedic surgeon.  Plan to discharge home possibly tomorrow 02/07/2019.  Case manager assisting with home health services and DME's.  Highly appreciate assistance.   Assessment/Plan: Principal Problem:   Sepsis (HCC) Active Problems:   Essential hypertension   Diabetes mellitus with complication (HCC)   CKD (chronic kidney disease) stage 3, GFR 30-59 ml/min   Chronic diastolic CHF (congestive heart failure) (HCC)   Warm antibody hemolytic anemia   Atrial fibrillation with RVR (HCC)  Resolving sepsis secondary to E. coli bacteremia  Presented with a fever, tachycardia and tachypnea, blood culture drawn on 02/02/2019 + E. coli Repeated blood cultures x2 peripherally on 02/04/2019 no growth x2 days Continue to follow cultures Sensitivities show resistance to ampicillin, Unasyn and Bactrim Continue Rocephin, sensitive, will switch to oral tomorrow for DC planning. Afebrile overnight.  Acute metabolic encephalopathy likely in the setting of acute illness  CT head done on 02/02/2019 showed negative for acute intracranial hemorrhage or mass possible age indeterminate lacunar infarct right cerebellum.  Atrophy and small vessel ischemic changes of the white matter. Continue to reorient as needed Continue fall precautions  Chronic right knee pain Seen by orthopedic surgery, no planned procedures. She will need to follow-up with own orthopedic surgeon Dr. Turner Daniels at discharge DME wheelchair in place  Presumed UTI Urine culture taken on 02/02/2019 unrevealing Continue Rocephin empirically  Permanent A. fib with RVR. RVR is improved.    Rate is currently controlled on diltiazem and Lopressor.  On Coumadin for  CVA prevention.  INR subtherapeutic.  Pharmacy managing Coumadin.  Resolved subtherapeutic INR Coumadin INR therapeutic 2.5 on 02/06/2019.  Type II diabetes mellitus type, tightly  controlled.  A1c 5.8 on 02/03/2019.  Avoid hypoglycemia.  Hypertension: Blood pressure stable.  On beta-blockers Cardizem and lisinopril.  Note that patient has chronic kidney disease.  Holding hydrochlorothiazide since patient is receiving fluids.  Blood pressure is at goal.  Continue current management.  AKI on Chronic kidney disease stage II creatinine appears to be at baseline 1.0 with GFR >60 on 02/05/19. Presented with Cr. 1.39 and GFR 42. Note that patient is on lisinopril.  Closely monitor urine output.  Continue to closely monitor renal function.  Chronic normocytic anemia and thrombocytopenia with history of autoimmune hemolytic anemia follow CBC closely.  Patient is on prednisone (confirmed with patient's daughter).  Will give stress dose steroids.  Hemoglobin 9.6 on 02/04/2019 from 11.5 on 02/03/2019. Hemoglobin 10.2 on 02/05/2019 from 9.6 on 02/04/2019.  Hyperlipidemia: Continue on statins.  Physical therapy PT OT recommended SNF with 24-hour assistance supervision Family prefers discharge to home. Case manager assisting with home health services and DME's Continue fall precautions.  Severe morbid obesity BMI 59 Recommend weight loss outpatient with regular physical activity as tolerated and healthy dieting.    DVT prophylaxis: Coumadin Code Status: Full code. Family Communication: None at bedside.  Disposition Plan:  Possible discharge to home with home health services on 02/07/2019 on oral antibiotics for E. coli bacteremia.   Consults called: Orthopedics.   Objective: Vitals:   02/05/19 2112 02/06/19 0515 02/06/19 0921 02/06/19 1335  BP: 129/75 110/71 139/82 103/81  Pulse: 98 89 (!) 101 78  Resp: 14 16  20   Temp: 99.2 F (37.3 C) 98.1 F (36.7 C)  97.6 F (36.4 C)  TempSrc: Oral Oral  Oral  SpO2: 98% 100%  100%  Weight:      Height:        Intake/Output Summary (Last 24 hours) at 02/06/2019 1441 Last data filed at 02/06/2019 1308 Gross per 24 hour   Intake 480 ml  Output 1200 ml  Net -720 ml   Filed Weights   02/02/19 1922 02/03/19 0506 02/05/19 0542  Weight: 81.6 kg 95 kg (!) 173 kg    Exam:  . General: 78 y.o. year-old female pleasant severely morbidly obese.  No acute distress.  Alert and interactive.   . Cardiovascular: Irregular rate and rhythm no rubs or gallops no JVD thyromegaly noted. [name] Respiratory: Clear to auscultation no wheezes or rales. . Abdomen: Obese nontender bowel sounds present. . Musculoskeletal: 1+ pitting edema in lower extremities bilaterally.  Hyperpigmented skin in the lower extremities bilaterally . Psychiatry: Mood is appropriate for condition and setting.   Data Reviewed: CBC: Recent Labs  Lab 02/02/19 1923 02/03/19 0209 02/04/19 0743 02/05/19 0320 02/06/19 0232  WBC 21.8* 20.5* 19.6* 17.8* 17.7*  NEUTROABS 17.4*  --   --   --  13.2*  HGB 11.1* 11.5* 9.6* 10.2* 9.5*  HCT 33.1* 35.7* 29.1* 30.0* 27.9*  MCV 84.0 86.4 84.3 82.0 81.8  PLT 147* PLATELET CLUMPS NOTED ON SMEAR, UNABLE TO ESTIMATE PLATELET CLUMPS NOTED ON SMEAR, UNABLE TO ESTIMATE 129* 160   Basic Metabolic Panel: Recent Labs  Lab 02/02/19 1931 02/03/19 0209 02/03/19 1157 02/04/19 0743 02/05/19 0320  NA 142 141  --  143 141  K 3.7 5.8* 3.4* 3.6 3.5  CL 105 105  --  108 110  CO2 25 24  --  21* 22  GLUCOSE 142* 121*  --  88 97  BUN 31* 30*  --  21 20  CREATININE 1.64* 1.39*  --  1.05* 1.01*  CALCIUM 8.9 8.5*  --  8.4* 8.3*  MG 2.1  --   --   --   --    GFR: Estimated Creatinine Clearance: 75.7 mL/min (A) (by C-G formula based on SCr of 1.01 mg/dL (H)). Liver Function Tests: Recent Labs  Lab 02/02/19 1931 02/04/19 0743  AST 28 15  ALT 16 13  ALKPHOS 91 79  BILITOT 1.4* 0.9  PROT 7.6 6.5  ALBUMIN 2.8* 2.2*   No results for input(s): LIPASE, AMYLASE in the last 168 hours. No results for input(s): AMMONIA in the last 168 hours. Coagulation Profile: Recent Labs  Lab 02/02/19 1931 02/04/19 1445  02/05/19 0320 02/06/19 0232  INR 1.2 1.3* 1.5* 2.5*   Cardiac Enzymes: No results for input(s): CKTOTAL, CKMB, CKMBINDEX, TROPONINI in the last 168 hours. BNP (last 3 results) No results for input(s): PROBNP in the last 8760 hours. HbA1C: No results for input(s): HGBA1C in the last 72 hours. CBG: Recent Labs  Lab 02/05/19 1226 02/05/19 1648 02/05/19 2114 02/06/19 0724 02/06/19 1134  GLUCAP 146* 87 108* 84 136*   Lipid Profile: No results for input(s): CHOL, HDL, LDLCALC, TRIG, CHOLHDL, LDLDIRECT in the last 72 hours. Thyroid Function Tests: No results for input(s): TSH, T4TOTAL, FREET4, T3FREE, THYROIDAB in the last 72 hours. Anemia Panel: No results for input(s): VITAMINB12, FOLATE, FERRITIN, TIBC, IRON, RETICCTPCT in the last 72 hours. Urine analysis:    Component Value Date/Time   COLORURINE YELLOW 02/02/2019 1923   APPEARANCEUR CLEAR 02/02/2019 1923   LABSPEC 1.013 02/02/2019 1923   PHURINE 5.0 02/02/2019 1923   GLUCOSEU NEGATIVE 02/02/2019 1923   GLUCOSEU NEGATIVE 09/06/2017 1200   HGBUR SMALL (A) 02/02/2019 1923   BILIRUBINUR NEGATIVE 02/02/2019 1923   BILIRUBINUR Neg 10/18/2017 1453   KETONESUR NEGATIVE 02/02/2019 1923   PROTEINUR NEGATIVE 02/02/2019 1923   UROBILINOGEN 0.2 10/18/2017 1453   UROBILINOGEN 1.0 09/06/2017 1200   NITRITE POSITIVE (A) 02/02/2019 1923   LEUKOCYTESUR LARGE (A) 02/02/2019 1923   Sepsis Labs: @LABRCNTIP (procalcitonin:4,lacticidven:4)  ) Recent Results (from the past 240 hour(s))  Blood culture (routine x 2)     Status: Abnormal   Collection Time: 02/02/19  8:00 PM   Specimen: BLOOD RIGHT ARM  Result Value Ref Range Status   Specimen Description BLOOD RIGHT ARM  Final   Special Requests   Final    BOTTLES DRAWN AEROBIC AND ANAEROBIC Blood Culture adequate volume   Culture  Setup Time   Final    GRAM NEGATIVE RODS IN BOTH AEROBIC AND ANAEROBIC BOTTLES CRITICAL RESULT CALLED TO, READ BACK BY AND VERIFIED WITHKelby Fam PHARMD  1434 02/03/19 A BROWNING Performed at [facility], [address] [place], [place] [address]    Culture ESCHERICHIA COLI (A)  Final   Report Status 02/05/2019 FINAL  Final   Organism ID, Bacteria ESCHERICHIA COLI  Final      Susceptibility   Escherichia coli - MIC*    AMPICILLIN >=32 RESISTANT Resistant     CEFAZOLIN <=4 SENSITIVE Sensitive     CEFEPIME <=1 SENSITIVE Sensitive     CEFTAZIDIME <=1 SENSITIVE Sensitive     CEFTRIAXONE <=1 SENSITIVE Sensitive     CIPROFLOXACIN <=0.25 SENSITIVE Sensitive     GENTAMICIN <=1 SENSITIVE Sensitive     IMIPENEM <=0.25 SENSITIVE Sensitive     TRIMETH/SULFA >=320 RESISTANT  Resistant     AMPICILLIN/SULBACTAM >=32 RESISTANT Resistant     PIP/TAZO <=4 SENSITIVE Sensitive     Extended ESBL NEGATIVE Sensitive     * ESCHERICHIA COLI  Blood Culture ID Panel (Reflexed)     Status: Abnormal   Collection Time: 02/02/19  8:00 PM  Result Value Ref Range Status   Enterococcus species NOT DETECTED NOT DETECTED Final   Listeria monocytogenes NOT DETECTED NOT DETECTED Final   Staphylococcus species NOT DETECTED NOT DETECTED Final   Staphylococcus aureus (BCID) NOT DETECTED NOT DETECTED Final   Streptococcus species NOT DETECTED NOT DETECTED Final   Streptococcus agalactiae NOT DETECTED NOT DETECTED Final   Streptococcus pneumoniae NOT DETECTED NOT DETECTED Final   Streptococcus pyogenes NOT DETECTED NOT DETECTED Final   Acinetobacter baumannii NOT DETECTED NOT DETECTED Final   Enterobacteriaceae species DETECTED (A) NOT DETECTED Final    Comment: Enterobacteriaceae represent a large family of gram-negative bacteria, not a single organism. CRITICAL RESULT CALLED TO, READ BACK BY AND VERIFIED WITH: Kelby Fam PHARMD 1434 02/03/19 A BROWNING    Enterobacter cloacae complex NOT DETECTED NOT DETECTED Final   Escherichia coli DETECTED (A) NOT DETECTED Final    Comment: CRITICAL RESULT CALLED TO, READ BACK BY AND VERIFIED WITH: Kelby Fam PHARMD 1434  02/03/19 A BROWNING    Klebsiella oxytoca NOT DETECTED NOT DETECTED Final   Klebsiella pneumoniae NOT DETECTED NOT DETECTED Final   Proteus species NOT DETECTED NOT DETECTED Final   Serratia marcescens NOT DETECTED NOT DETECTED Final   Carbapenem resistance NOT DETECTED NOT DETECTED Final   Haemophilus influenzae NOT DETECTED NOT DETECTED Final   Neisseria meningitidis NOT DETECTED NOT DETECTED Final   Pseudomonas aeruginosa NOT DETECTED NOT DETECTED Final   Candida albicans NOT DETECTED NOT DETECTED Final   Candida glabrata NOT DETECTED NOT DETECTED Final   Candida krusei NOT DETECTED NOT DETECTED Final   Candida parapsilosis NOT DETECTED NOT DETECTED Final   Candida tropicalis NOT DETECTED NOT DETECTED Final    Comment: Performed at [facility], [address], [place], [place] [address]  Blood culture (routine x 2)     Status: Abnormal   Collection Time: 02/02/19  8:05 PM   Specimen: BLOOD LEFT ARM  Result Value Ref Range Status   Specimen Description BLOOD LEFT ARM  Final   Special Requests   Final    BOTTLES DRAWN AEROBIC AND ANAEROBIC Blood Culture adequate volume   Culture  Setup Time   Final    IN BOTH AEROBIC AND ANAEROBIC BOTTLES GRAM NEGATIVE RODS CRITICAL VALUE NOTED.  VALUE IS CONSISTENT WITH PREVIOUSLY REPORTED AND CALLED VALUE.    Culture (A)  Final    ESCHERICHIA COLI SUSCEPTIBILITIES PERFORMED ON PREVIOUS CULTURE WITHIN THE LAST 5 DAYS. Performed at [facility], [address] [place], [place] [address]    Report Status 02/06/2019 FINAL  Final  Urine culture     Status: Abnormal   Collection Time: 02/02/19  8:36 PM   Specimen: In/Out Cath Urine  Result Value Ref Range Status   Specimen Description IN/OUT CATH URINE  Final   Special Requests   Final    NONE Performed at [facility], [address], [place], [place] [address]    Culture MULTIPLE SPECIES PRESENT, SUGGEST RECOLLECTION (A)  Final   Report Status 02/03/2019 FINAL  Final   SARS CORONAVIRUS 2 (TAT 6-24 HRS) Nasopharyngeal Nasopharyngeal Swab     Status: None   Collection Time:  02/02/19 10:03 PM   Specimen: Nasopharyngeal Swab  Result Value Ref Range Status   SARS Coronavirus 2 NEGATIVE NEGATIVE Final    Comment: (NOTE) SARS-CoV-2 target nucleic acids are NOT DETECTED. The SARS-CoV-2 RNA is generally detectable in upper and lower respiratory specimens during the acute phase of infection. Negative results do not preclude SARS-CoV-2 infection, do not rule out co-infections with other pathogens, and should not be used as the sole basis for treatment or other patient management decisions. Negative results must be combined with clinical observations, patient history, and epidemiological information. The expected result is Negative. Fact Sheet for Patients: HairSlick.no Fact Sheet for Healthcare Providers: quierodirigir.com This test is not yet approved or cleared by the [place] FDA and  has been authorized for detection and/or diagnosis of SARS-CoV-2 by FDA under an Emergency Use Authorization (EUA). This EUA will remain  in effect (meaning this test can be used) for the duration of the COVID-19 declaration under Section 56 4(b)(1) of the Act, 21 U.S.C. section 360bbb-3(b)(1), unless the authorization is terminated or revoked sooner. Performed at [facility], [address], [place], [place] [address]   MRSA PCR Screening     Status: None   Collection Time: 02/03/19  5:28 AM   Specimen: Nasal Mucosa; Nasopharyngeal  Result Value Ref Range Status   MRSA by PCR NEGATIVE NEGATIVE Final    Comment:        The GeneXpert MRSA Assay (FDA approved for NASAL specimens only), is one component of a comprehensive MRSA colonization surveillance program. It is not intended to diagnose MRSA infection nor to guide or monitor treatment for MRSA infections. Performed at [facility],  [address] [place], [place] [address]   Culture, blood (routine x 2)     Status: None (Preliminary result)   Collection Time: 02/04/19  7:40 AM   Specimen: BLOOD RIGHT HAND  Result Value Ref Range Status   Specimen Description BLOOD RIGHT HAND  Final   Special Requests   Final    BOTTLES DRAWN AEROBIC ONLY Blood Culture results may not be optimal due to an inadequate volume of blood received in culture bottles   Culture   Final    NO GROWTH 2 DAYS Performed at [facility], [address], [place], [place] [address]    Report Status PENDING  Incomplete  Culture, blood (routine x 2)     Status: None (Preliminary result)   Collection Time: 02/04/19 10:17 AM   Specimen: BLOOD RIGHT HAND  Result Value Ref Range Status   Specimen Description BLOOD RIGHT HAND  Final   Special Requests   Final    BOTTLES DRAWN AEROBIC ONLY Blood Culture results may not be optimal due to an inadequate volume of blood received in culture bottles   Culture   Final    NO GROWTH 2 DAYS Performed at [facility], [address] [place], [place] [address]    Report Status PENDING  Incomplete      Studies: No results found.  Scheduled Meds: . diltiazem  180 mg Oral Daily  . ferrous sulfate  325 mg Oral BID  . gabapentin  300 mg Oral TID  . insulin aspart  0-9 Units Subcutaneous TID WC  . lisinopril  20 mg Oral Daily  . metoprolol tartrate  50 mg Oral BID  . pravastatin  20 mg Oral q1800  . predniSONE  5 mg Oral Q breakfast  . Warfarin - Pharmacist  Dosing Inpatient   Does not apply q1800    Continuous Infusions: . sodium chloride 1,000 mL (02/06/19 0852)  .  ceFAZolin (ANCEF) IV       LOS: 4 days     Darlin Drop, MD [facility] Pager [phone]  If 7PM-7AM, please contact night-coverage [email or web address] Password [facility] 02/06/2019, 2:41 PM

## 2019-02-06 NOTE — Progress Notes (Signed)
Called patient's son to give updates via phone. No answer. Will call again later.

## 2019-02-06 NOTE — TOC Initial Note (Addendum)
Transition of Care [facility]) - Initial/Assessment Note    Patient Details  Name: Robin Snow MRN: [id number] Date of Birth: [birth year]  Transition of Care [facility] CM/SW Contact:    Mearl Latin, LCSW Phone Number: 02/06/2019, 10:32 AM  Clinical Narrative:                 CSW received consult for possible SNF at time of discharge. CSW spoke with patient's daughter regarding PT recommendation of SNF at time of discharge. Patient's daughter reported that she would like home health services instead of SNF. She resides at home with the patient and can provide 24 hour supervision.  Patient's daughter reports preference for [facility] since her brother has used them before and liked their care. CSW will send referral for review. CSW provided Medicare [place] ratings list. Patient has a walker at home. PT is recommending a wheelchair, which daughter is requesting. CSW will have it delivered by [employer]. CSW confirmed PCP and address with patient's daughter. No further questions reported at this time. CSW to continue to follow and assist with discharge planning needs.   Expected Discharge Plan: Home w Home Health Services Barriers to Discharge: Continued Medical Work up   Patient Goals and CMS Choice Patient states their goals for this hospitalization and ongoing recovery are:: Return home CMS Medicare.gov Compare Post Acute Care list provided to:: Patient Represenative (must comment)(Daughter) Choice offered to / list presented to : Adult Children  Expected Discharge Plan and Services Expected Discharge Plan: Home w Home Health Services In-house Referral: NA Discharge Planning Services: CM Consult Post Acute Care Choice: Home Health Living arrangements for the past 2 months: Single Family Home                           HH Arranged: PT, OT, Nurse's Aide HH Agency: [facility]        Prior Living Arrangements/Services Living arrangements for the past 2 months: Single Family  Home Lives with:: Adult Children Patient language and need for interpreter reviewed:: Yes Do you feel safe going back to the place where you live?: Yes      Need for Family Participation in Patient Care: Yes (Comment) Care giver support system in place?: Yes (comment) Current home services: DME Criminal Activity/Legal Involvement Pertinent to Current Situation/Hospitalization: No - Comment as needed  Activities of Daily Living      Permission Sought/Granted Permission sought to share information with : Facility Medical sales representative, Family Supports Permission granted to share information with : Yes, Verbal Permission Granted  Share Information with NAME: [name]  Permission granted to share info w AGENCY: Home Health  Permission granted to share info w Relationship: Daughter  Permission granted to share info w Contact Information: [phone]  Emotional Assessment Appearance:: Appears stated age Attitude/Demeanor/Rapport: Unable to Assess Affect (typically observed): Unable to Assess Orientation: : Oriented to Self, Oriented to Place Alcohol / Substance Use: Not Applicable Psych Involvement: No (comment)  Admission diagnosis:  Acute cystitis with hematuria [N30.01] Arthritis of right knee due to other bacteria (HCC) [M00.861] Sepsis (HCC) [A41.9] Patient Active Problem List   Diagnosis Date Noted  . Atrial fibrillation with RVR (HCC) 02/03/2019  . Acute cystitis with hematuria   . Sepsis (HCC) 02/02/2019  . Foot pain 07/19/2018  . Warm antibody hemolytic anemia 11/24/2017  . Pre-op evaluation 09/24/2017  . Urinary frequency 09/06/2017  . Obstructive sleep apnea   . Long term (current) use of  anticoagulants 12/25/2016  . Orthostatic hypotension 09/08/2016  . Chronic diastolic CHF (congestive heart failure) ([place]) 09/08/2016  . Sinus pause: 4.02-4.80sec per telemetry 09/08/2016 09/08/2016  . Tachy-brady syndrome ([place]) 09/08/2016  . Sinus bradycardia 07/08/2016  . CKD  (chronic kidney disease) stage 3, GFR 30-59 ml/min 05/11/2016  . Anemia 05/01/2016  . Diabetes mellitus with complication ([place])   . Physical deconditioning 12/14/2014  . Essential hypertension 05/18/2014  . HLD (hyperlipidemia) 05/18/2014  . Chronic venous insufficiency 05/11/2014  . Embolic stroke ([place]) 88/50/2774  . Right hemiparesis ([place]) 01/10/2014  . GERD (gastroesophageal reflux disease)   . Chronic pain syndrome 09/07/2013  . Painful total knee replacement ([place]) 08/01/2013  . Permanent atrial fibrillation ([place]) 05/22/2010   PCP:  [name], MD Pharmacy:   [facility], [place] [address] Phone: [phone] Fax: [facility] [place], [place] [address] Phone: [phone] Fax: [phone]     Social Determinants of Health (SDOH) Interventions    Readmission Risk Interventions No flowsheet data found.

## 2019-02-06 NOTE — Progress Notes (Signed)
[place] for Coumadin Indication: atrial fibrillation  Allergies  Allergen Reactions  . Aspirin Hives and Swelling    Angioedema   . Rofecoxib Hives  . Hydrocodone Hives    Tolerates oxycodone and tramadol    Patient Measurements: Height: 5\' 7"  (170.2 cm) Weight: (!) 381 lb 6.3 oz (173 kg) IBW/kg (Calculated) : 61.6  Vital Signs: Temp: 98.1 F (36.7 C) (11/02 0515) Temp Source: Oral (11/02 0515) BP: 139/82 (11/02 0921) Pulse Rate: 101 (11/02 0921)  Labs: Recent Labs    02/04/19 0743 02/04/19 1445 02/05/19 0320 02/06/19 0232  HGB 9.6*  --  10.2* 9.5*  HCT 29.1*  --  30.0* 27.9*  PLT PLATELET CLUMPS NOTED ON SMEAR, UNABLE TO ESTIMATE  --  129* 160  LABPROT  --  15.8* 17.9* 26.4*  INR  --  1.3* 1.5* 2.5*  CREATININE 1.05*  --  1.01*  --     Estimated Creatinine Clearance: 75.7 mL/min (A) (by C-G formula based on SCr of 1.01 mg/dL (H)).   Medical History: Past Medical History:  Diagnosis Date  . AKI (acute kidney injury) ([place]) 04/2016  . Anemia   . Arthritis   . Chronic atrial fibrillation ([place])   . Chronic edema   . Chronic venous insufficiency   . CKD (chronic kidney disease), stage III   . Coagulopathy ([place])   . Diabetes mellitus    Type 2  . Diverticulosis   . Dyslipidemia   . GERD (gastroesophageal reflux disease)   . GI bleed 2015   a. lower GI from diverticular source 2015.  [name] Hx of transfusion of packed red blood cells   . Hypertension   . Morbid obesity ([place])   . Obstructive sleep apnea    on CPAP  . Pulmonary hypertension ([place])   . Pyelonephritis 04/2016  . Renal infarct ([place])    a. 04/2016- adm with flank pain, ? pyelo vs renal infarct in setting of recent subtherapeutic INR.  [name] Sinus bradycardia   . Stroke [facility]) 2015   Assessment: 33 yof on warfarin PTA for afib/CVA initally held in case ortho procedure. INR 1.2 on 10/29. Hgb 11.5 stable.  Resumed warfarin 10/30.  - PTA dosing 10mg  WSS, 7.5mg   TTThF with admit INR 1.2  INR was suntherapeutic on admit date 10/29. Today the INR is up to 2.5, increase from 1.5 yesterday. Received metronidazole 10/29- 11/1, dc'd 11/1, can increase warfarin hyprothrombinemic effect.  Received warfarin 10 mg on 10/30, 10/31 and 11/1.  Hgb down to 9.5, pltc improved to 160 k. No bleeding documented.   Goal of Therapy:  INR 2-3 Monitor platelets by anticoagulation protocol: Yes   Plan:  Hold Coumadin today.  Daily INR Monitor daily CBC  Nicole Cella, RPh Clinical Pharmacist Please check AMION for all [place] phone numbers After 10:00 PM, call [place] [phone] 02/06/2019,11:05 AM

## 2019-02-06 NOTE — Care Management Important Message (Signed)
Important Message  Patient Details  Name: GENISIS SONNIER MRN: [id number] Date of Birth: 03-Nov-1940   Medicare Important Message Given:  Yes     Lorrane Mccay [address] 02/06/2019, 4:50 PM

## 2019-02-07 LAB — CBC WITH DIFFERENTIAL/PLATELET
Abs Immature Granulocytes: 0.83 10*3/uL — ABNORMAL HIGH (ref 0.00–0.07)
Basophils Absolute: 0.1 10*3/uL (ref 0.0–0.1)
Basophils Relative: 0 %
Eosinophils Absolute: 0.1 10*3/uL (ref 0.0–0.5)
Eosinophils Relative: 1 %
HCT: 29.2 % — ABNORMAL LOW (ref 36.0–46.0)
Hemoglobin: 9.8 g/dL — ABNORMAL LOW (ref 12.0–15.0)
Immature Granulocytes: 4 %
Lymphocytes Relative: 15 %
Lymphs Abs: 2.7 10*3/uL (ref 0.7–4.0)
MCH: 28.1 pg (ref 26.0–34.0)
MCHC: 33.6 g/dL (ref 30.0–36.0)
MCV: 83.7 fL (ref 80.0–100.0)
Monocytes Absolute: 1.3 10*3/uL — ABNORMAL HIGH (ref 0.1–1.0)
Monocytes Relative: 7 %
Neutro Abs: 13.7 10*3/uL — ABNORMAL HIGH (ref 1.7–7.7)
Neutrophils Relative %: 73 %
Platelets: 171 10*3/uL (ref 150–400)
RBC: 3.49 MIL/uL — ABNORMAL LOW (ref 3.87–5.11)
RDW: 16.8 % — ABNORMAL HIGH (ref 11.5–15.5)
WBC: 18.7 10*3/uL — ABNORMAL HIGH (ref 4.0–10.5)
nRBC: 0.2 % (ref 0.0–0.2)

## 2019-02-07 LAB — BASIC METABOLIC PANEL
Anion gap: 8 (ref 5–15)
BUN: 12 mg/dL (ref 8–23)
CO2: 21 mmol/L — ABNORMAL LOW (ref 22–32)
Calcium: 8.1 mg/dL — ABNORMAL LOW (ref 8.9–10.3)
Chloride: 110 mmol/L (ref 98–111)
Creatinine, Ser: 0.96 mg/dL (ref 0.44–1.00)
GFR calc Af Amer: >60 mL/min (ref >60)
GFR calc non Af Amer: 56 mL/min — ABNORMAL LOW (ref >60)
Glucose, Bld: 92 mg/dL (ref 70–99)
Potassium: 3.6 mmol/L (ref 3.5–5.1)
Sodium: 139 mmol/L (ref 135–145)

## 2019-02-07 LAB — GLUCOSE, CAPILLARY
Glucose-Capillary: 106 mg/dL — ABNORMAL HIGH (ref 70–99)
Glucose-Capillary: 120 mg/dL — ABNORMAL HIGH (ref 70–99)
Glucose-Capillary: 114 mg/dL — ABNORMAL HIGH (ref 70–99)
Glucose-Capillary: 107 mg/dL — ABNORMAL HIGH (ref 70–99)

## 2019-02-07 LAB — PROCALCITONIN: Procalcitonin: 0.6 ng/mL

## 2019-02-07 LAB — LACTIC ACID, PLASMA: Lactic Acid, Venous: 1.5 mmol/L (ref 0.5–1.9)

## 2019-02-07 LAB — PROTIME-INR
INR: 3 — ABNORMAL HIGH (ref 0.8–1.2)
Prothrombin Time: 30.7 seconds — ABNORMAL HIGH (ref 11.4–15.2)

## 2019-02-07 LAB — MAGNESIUM: Magnesium: 1.8 mg/dL (ref 1.7–2.4)

## 2019-02-07 MED ORDER — DILTIAZEM HCL ER COATED BEADS 120 MG PO CP24
120.0000 mg | ORAL_CAPSULE | Freq: Every day | ORAL | Status: DC
  Administered 2019-02-08: 120 mg via ORAL
  Filled 2019-02-07: qty 1

## 2019-02-07 MED ORDER — SODIUM CHLORIDE 0.9 % IV SOLN
1000.0000 mL | INTRAVENOUS | Status: DC
  Administered 2019-02-07: 1000 mL via INTRAVENOUS
  Administered 2019-02-08: 50 mL/h via INTRAVENOUS

## 2019-02-07 MED ORDER — METOPROLOL TARTRATE 25 MG PO TABS
25.0000 mg | ORAL_TABLET | Freq: Two times a day (BID) | ORAL | Status: DC
  Administered 2019-02-07 – 2019-02-08 (×3): 25 mg via ORAL
  Filled 2019-02-07 (×3): qty 1

## 2019-02-07 MED ORDER — WARFARIN SODIUM 7.5 MG PO TABS
7.5000 mg | ORAL_TABLET | Freq: Once | ORAL | Status: AC
  Administered 2019-02-07: 7.5 mg via ORAL
  Filled 2019-02-07: qty 1

## 2019-02-07 NOTE — TOC Transition Note (Signed)
Transition of Care [facility]) - CM/SW Discharge Note   Patient Details  Name: Robin Snow MRN: [id number] Date of Birth: 27-Apr-1940  Transition of Care [facility] CM/SW Contact:  Benard Halsted, LCSW Phone Number: 02/07/2019, 9:07 AM   Clinical Narrative:    [name] has accepted patient for home health services. CSW contacted [employer] for delivery of wheelchair to patient's room prior to discharge. No other needs identified at this time.    Final next level of care: [place] Barriers to Discharge: No Barriers Identified   Patient Goals and CMS Choice Patient states their goals for this hospitalization and ongoing recovery are:: Return home CMS Medicare.gov Compare Post Acute Care list provided to:: Patient Represenative (must comment)(Daughter) Choice offered to / list presented to : Adult Children  Discharge Placement                       Discharge Plan and Services In-house Referral: NA Discharge Planning Services: CM Consult Post Acute Care Choice: Home Health          DME Arranged: Wheelchair manual DME Agency: Other - Comment([employer]) Date DME Agency Contacted: 02/07/19 Time DME Agency Contacted: [phone] Representative spoke with at DME Agency: Suanne Marker HH Arranged: PT, OT, Nurse's Aide [place] Agency: [place] Date [place]: 02/06/19 Time [place]: 1600 Representative spoke with at [place]: [place] ([place]) Interventions     Readmission Risk Interventions No flowsheet data found.

## 2019-02-07 NOTE — Progress Notes (Signed)
[place] for Coumadin Indication: atrial fibrillation  Allergies  Allergen Reactions  . Aspirin Hives and Swelling    Angioedema   . Rofecoxib Hives  . Hydrocodone Hives    Tolerates oxycodone and tramadol    Patient Measurements: Height: 5\' 7"  (170.2 cm) Weight: (!) 381 lb 6.3 oz (173 kg) IBW/kg (Calculated) : 61.6  Vital Signs: Temp: 98.6 F (37 C) (11/03 1303) Temp Source: Oral (11/03 1303) BP: 106/87 (11/03 0921) Pulse Rate: 85 (11/03 1303)  Labs: Recent Labs    02/05/19 0320 02/06/19 0232 02/07/19 0233 02/07/19 0641  HGB 10.2* 9.5*  --  9.8*  HCT 30.0* 27.9*  --  29.2*  PLT 129* 160  --  171  LABPROT 17.9* 26.4* 30.7*  --   INR 1.5* 2.5* 3.0*  --   CREATININE 1.01*  --   --  0.96    Estimated Creatinine Clearance: 79.7 mL/min (by C-G formula based on SCr of 0.96 mg/dL).   Medical History: Past Medical History:  Diagnosis Date  . AKI (acute kidney injury) ([place]) 04/2016  . Anemia   . Arthritis   . Chronic atrial fibrillation ([place])   . Chronic edema   . Chronic venous insufficiency   . CKD (chronic kidney disease), stage III   . Coagulopathy ([place])   . Diabetes mellitus    Type 2  . Diverticulosis   . Dyslipidemia   . GERD (gastroesophageal reflux disease)   . GI bleed 2015   a. lower GI from diverticular source 2015.  [name] Hx of transfusion of packed red blood cells   . Hypertension   . Morbid obesity ([place])   . Obstructive sleep apnea    on CPAP  . Pulmonary hypertension ([place])   . Pyelonephritis 04/2016  . Renal infarct ([place])    a. 04/2016- adm with flank pain, ? pyelo vs renal infarct in setting of recent subtherapeutic INR.  [name] Sinus bradycardia   . Stroke [facility]) 2015   Assessment: 73 yof on warfarin PTA for afib/CVA initally held in case ortho procedure. INR 1.2 subtherapeutic on admit 10/29.   Resumed warfarin 10/30.  - PTA dosing 10mg  WSS, 7.5mg  TTThF with admit INR 1.2, last dose taken pta  10/28.   Today INR = 3.0 therapeutic , warfarin dos held on 11/2.  The larger increase seen on 11/2 likely due to DDI with metronidazole 10/29>11/1 (metronidazole  can increase warfarin hyprothrombinemic effect).  Now off metronidazole. Hgb 9.8 low stable, pltc 171 improved.   No bleeding documented.    Goal of Therapy:  INR 2-3 Monitor platelets by anticoagulation protocol: Yes   Plan:   Coumadin 7.5 mg today.  Daily INR Monitor daily CBC  Nicole Cella, RPh Clinical Pharmacist Please check AMION for all [place] phone numbers After 10:00 PM, call [place] [phone] 02/07/2019,1:24 PM

## 2019-02-07 NOTE — Progress Notes (Addendum)
PROGRESS NOTE  Robin Snow ZOX:[id number] DOB: 1940/06/29 DOA: 02/02/2019 PCP: [name], MD  HPI/Recap of past 24 hours: Robin Snow a 78 y.o.femalewithhistory of atrial fibrillation, hypertension, diabetes mellitus, chronic kidney disease stage III had a fall at home when patient was trying to get out of the bed. Patient states she did not hit her head or lose consciousness. She fell onto her right knee. Patient as per the daughter with whom I spoke has been having chronic issues with the right knee and swelling which patient cannot place weight on. Given the fall and increasing pain in the right knee patient was brought to the ER.  ED Course:In the ER patient was febrile with temperature of 101.5 F lab work show WBC count of 21.8 hemoglobin 11.1 platelets 147 creatinine 1.6 lactic acid was 2.1 which improved to 1.6 CRP was 19.7 UA was consistent with UTI. Right knee was showing features concerning for osteomyelitis and septic arthritis. On-call orthopedic surgeon Dr. Jena Gauss has been consulted will be seeing patient in consult. Chest x-ray was showing chronic changes. Patient was empirically started on antibiotic for sepsis. At presentation patient was in A. fib with RVR and rate improved with IV fluid bolus.  Seen by orthopedic surgery no planned procedures.  She will need to follow-up with her primary surgeon in [place].  Blood cultures x2 drawn on 02/02/2019 + for E. coli.  IV antibiotics appropriately de-escalated to ceftriaxone.  Blood cultures x2 repeated on 02/04/2019.   02/05/19: Patient was seen and examined at her bedside this morning.  She is alert but confused.  No acute events overnight.  On Rocephin for E. coli bacteremia.  PT OT assessment recommended SNF with 24-hour supervision assistance.  CSW consulted to assist with placement.  02/06/19: Patient was seen and examined at her bedside this morning.  No acute events overnight.  She has no  new complaints.  Right knee pain is worse when she moves.  She will need to follow-up with her orthopedic surgeon.  Plan to discharge home possibly tomorrow 02/07/2019.  Case manager assisting with home health services and DME's.  Highly appreciate assistance.  02/07/19: Patient was seen and examined at bedside this morning.  Reports pain in her right knee.  Worsening leukocytosis this morning possibly worsened by acute inflammation.  We will continue IV antibiotics for another day and repeat labs in the morning.   Assessment/Plan: Principal Problem:   Sepsis (HCC) Active Problems:   Essential hypertension   Diabetes mellitus with complication (HCC)   CKD (chronic kidney disease) stage 3, GFR 30-59 ml/min   Chronic diastolic CHF (congestive heart failure) (HCC)   Warm antibody hemolytic anemia   Atrial fibrillation with RVR (HCC)  Sepsis secondary to E. coli bacteremia  Presented with a fever, tachycardia and tachypnea, blood culture drawn on 02/02/2019 + E. coli Repeated blood cultures x2 peripherally on 02/04/2019 no growth x3 days Continue to follow cultures Sensitivities show resistance to ampicillin, Unasyn and Bactrim Continue Rocephin, sensitive, will switch to oral tomorrow for DC planning. Worsening leukocytosis possibly contributed by acute arthritic inflammation Continue IV antibiotics Repeat CBC in the morning and monitor fever curve  Acute metabolic encephalopathy likely in the setting of acute illness  CT head done on 02/02/2019 showed negative for acute intracranial hemorrhage or mass possible age indeterminate lacunar infarct right cerebellum.  Atrophy and small vessel ischemic changes of the white matter. Continue to reorient as needed Continue fall precautions At the time of this  visit she is alert and oriented x1.  Chronic right knee pain Seen by orthopedic surgery, no planned procedures. She will need to follow-up with own orthopedic surgeon Dr. Turner Daniels at  discharge DME wheelchair in place  Presumed UTI Urine culture taken on 02/02/2019 unrevealing Continue Rocephin empirically  Permanent A. fib with RVR. RVR is improved.    Rate is currently controlled on diltiazem and Lopressor.  On Coumadin for CVA prevention.  INR subtherapeutic.  Pharmacy managing Coumadin.  Resolved subtherapeutic INR Coumadin INR therapeutic 3.0 on 02/07/2019. Pharmacy managing.  Highly appreciated.  Type II diabetes mellitus type, tightly controlled.  A1c 5.8 on 02/03/2019.  Avoid hypoglycemia.  Hypertension: Blood pressures are soft.  Cut down dose of p.o. Lopressor to 25 mg twice daily and p.o. Cardizem down to 120 mg daily and hold off lisinopril.  IV normal saline rate has been decreased to 50 cc/h to avoid volume overload.  Resolved AKI Creatinine appears to be at baseline 0.9 with GFR greater than 60 on 02/07/2019.   Presented with Cr. 1.39 and GFR 42. Note that patient is on lisinopril.  1.6 L urine output recorded in the last 24  Chronic normocytic anemia and thrombocytopenia with history of autoimmune hemolytic anemia follow CBC closely.  Patient is on prednisone (confirmed with patient's daughter).  Will give stress dose steroids. Hemoglobin 9.8 on 02/07/2019.  No sign of overt bleeding.  Hyperlipidemia: Continue on statins.  Physical therapy PT OT recommended SNF with 24-hour assistance supervision Family prefers discharge to home. Case manager assisting with home health services and DME's Continue fall precautions.  Severe morbid obesity BMI 59 Recommend weight loss outpatient with regular physical activity as tolerated and healthy dieting.    DVT prophylaxis: Coumadin Code Status: Full code. Family Communication: None at bedside.  Disposition Plan:  Possible discharge to home with home health services on 02/08/2019 on oral antibiotics for E. coli bacteremia with clinical improvement.  She will need to have close follow-up appointment with  her orthopedic surgeon.   Consults called: Orthopedics.   Objective: Vitals:   02/06/19 2134 02/07/19 0553 02/07/19 0921 02/07/19 1303  BP: (!) 158/95 (!) 125/91 106/87 119/74  Pulse: 92 78 (!) 105 85  Resp: 19 19 18 19   Temp: 99 F (37.2 C) 99.3 F (37.4 C) (!) 97.5 F (36.4 C) 98.6 F (37 C)  TempSrc: Oral Oral Oral Oral  SpO2: 98% 99% 100% 99%  Weight:      Height:        Intake/Output Summary (Last 24 hours) at 02/07/2019 1550 Last data filed at 02/07/2019 1000 Gross per 24 hour  Intake 1652 ml  Output 1300 ml  Net 352 ml   Filed Weights   02/02/19 1922 02/03/19 0506 02/05/19 0542  Weight: 81.6 kg 95 kg (!) 173 kg    Exam:  . General: 78 y.o. year-old female pleasant severely obese in no acute distress.  Alert and oriented x1. . Cardiovascular: Irregular rate and rhythm no rubs or gallops.  No JVD  . Respiratory: Clear consultation no wheezes or rales. . Abdomen: Obese nontender normal bowel sounds present musculoskeletal: 1+ pitting edema lower extremity bilaterally.  Hyperpigmented skin in the lower extremities bilaterally . Psychiatry: Mood is appropriate for condition and setting.   Data Reviewed: CBC: Recent Labs  Lab 02/02/19 1923 02/03/19 0209 02/04/19 0743 02/05/19 0320 02/06/19 0232 02/07/19 0641  WBC 21.8* 20.5* 19.6* 17.8* 17.7* 18.7*  NEUTROABS 17.4*  --   --   --  13.2* 13.7*  HGB 11.1* 11.5* 9.6* 10.2* 9.5* 9.8*  HCT 33.1* 35.7* 29.1* 30.0* 27.9* 29.2*  MCV 84.0 86.4 84.3 82.0 81.8 83.7  PLT 147* PLATELET CLUMPS NOTED ON SMEAR, UNABLE TO ESTIMATE PLATELET CLUMPS NOTED ON SMEAR, UNABLE TO ESTIMATE 129* 160 315   Basic Metabolic Panel: Recent Labs  Lab 02/02/19 1931 02/03/19 0209 02/03/19 1157 02/04/19 0743 02/05/19 0320 02/07/19 0641  NA 142 141  --  143 141 139  K 3.7 5.8* 3.4* 3.6 3.5 3.6  CL 105 105  --  108 110 110  CO2 25 24  --  21* 22 21*  GLUCOSE 142* 121*  --  88 97 92  BUN 31* 30*  --  21 20 12   CREATININE 1.64*  1.39*  --  1.05* 1.01* 0.96  CALCIUM 8.9 8.5*  --  8.4* 8.3* 8.1*  MG 2.1  --   --   --   --  1.8   GFR: Estimated Creatinine Clearance: 79.7 mL/min (by C-G formula based on SCr of 0.96 mg/dL). Liver Function Tests: Recent Labs  Lab 02/02/19 1931 02/04/19 0743  AST 28 15  ALT 16 13  ALKPHOS 91 79  BILITOT 1.4* 0.9  PROT 7.6 6.5  ALBUMIN 2.8* 2.2*   No results for input(s): LIPASE, AMYLASE in the last 168 hours. No results for input(s): AMMONIA in the last 168 hours. Coagulation Profile: Recent Labs  Lab 02/02/19 1931 02/04/19 1445 02/05/19 0320 02/06/19 0232 02/07/19 0233  INR 1.2 1.3* 1.5* 2.5* 3.0*   Cardiac Enzymes: No results for input(s): CKTOTAL, CKMB, CKMBINDEX, TROPONINI in the last 168 hours. BNP (last 3 results) No results for input(s): PROBNP in the last 8760 hours. HbA1C: No results for input(s): HGBA1C in the last 72 hours. CBG: Recent Labs  Lab 02/06/19 1134 02/06/19 1549 02/06/19 2133 02/07/19 0737 02/07/19 1207  GLUCAP 136* 143* 110* 106* 120*   Lipid Profile: No results for input(s): CHOL, HDL, LDLCALC, TRIG, CHOLHDL, LDLDIRECT in the last 72 hours. Thyroid Function Tests: No results for input(s): TSH, T4TOTAL, FREET4, T3FREE, THYROIDAB in the last 72 hours. Anemia Panel: No results for input(s): VITAMINB12, FOLATE, FERRITIN, TIBC, IRON, RETICCTPCT in the last 72 hours. Urine analysis:    Component Value Date/Time   COLORURINE YELLOW 02/02/2019 1923   APPEARANCEUR CLEAR 02/02/2019 1923   LABSPEC 1.013 02/02/2019 1923   PHURINE 5.0 02/02/2019 1923   GLUCOSEU NEGATIVE 02/02/2019 1923   GLUCOSEU NEGATIVE 09/06/2017 1200   HGBUR SMALL (A) 02/02/2019 1923   BILIRUBINUR NEGATIVE 02/02/2019 1923   BILIRUBINUR Neg 10/18/2017 1453   [place] 02/02/2019 1923   PROTEINUR NEGATIVE 02/02/2019 1923   UROBILINOGEN 0.2 10/18/2017 1453   UROBILINOGEN 1.0 09/06/2017 1200   NITRITE POSITIVE (A) 02/02/2019 1923   LEUKOCYTESUR LARGE (A)  02/02/2019 1923   Sepsis Labs: @LABRCNTIP (procalcitonin:4,lacticidven:4)  ) Recent Results (from the past 240 hour(s))  Blood culture (routine x 2)     Status: Abnormal   Collection Time: 02/02/19  8:00 PM   Specimen: BLOOD RIGHT ARM  Result Value Ref Range Status   Specimen Description BLOOD RIGHT ARM  Final   Special Requests   Final    BOTTLES DRAWN AEROBIC AND ANAEROBIC Blood Culture adequate volume   Culture  Setup Time   Final    GRAM NEGATIVE RODS IN BOTH AEROBIC AND ANAEROBIC BOTTLES CRITICAL RESULT CALLED TO, READ BACK BY AND VERIFIED WITHTillman Sers PHARMD 1434 02/03/19 A BROWNING Performed at [facility], [address] [place], [place] [address]    Culture ESCHERICHIA COLI (A)  Final   Report Status 02/05/2019 FINAL  Final   Organism ID, Bacteria ESCHERICHIA COLI  Final      Susceptibility   Escherichia coli - MIC*    AMPICILLIN >=32 RESISTANT Resistant     CEFAZOLIN <=4 SENSITIVE Sensitive     CEFEPIME <=1 SENSITIVE Sensitive     CEFTAZIDIME <=1 SENSITIVE Sensitive     CEFTRIAXONE <=1 SENSITIVE Sensitive     CIPROFLOXACIN <=0.25 SENSITIVE Sensitive     GENTAMICIN <=1 SENSITIVE Sensitive     IMIPENEM <=0.25 SENSITIVE Sensitive     TRIMETH/SULFA >=320 RESISTANT Resistant     AMPICILLIN/SULBACTAM >=32 RESISTANT Resistant     PIP/TAZO <=4 SENSITIVE Sensitive     Extended ESBL NEGATIVE Sensitive     * ESCHERICHIA COLI  Blood Culture ID Panel (Reflexed)     Status: Abnormal   Collection Time: 02/02/19  8:00 PM  Result Value Ref Range Status   Enterococcus species NOT DETECTED NOT DETECTED Final   Listeria monocytogenes NOT DETECTED NOT DETECTED Final   Staphylococcus species NOT DETECTED NOT DETECTED Final   Staphylococcus aureus (BCID) NOT DETECTED NOT DETECTED Final   Streptococcus species NOT DETECTED NOT DETECTED Final   Streptococcus agalactiae NOT DETECTED NOT DETECTED Final   Streptococcus pneumoniae NOT DETECTED NOT DETECTED Final   Streptococcus  pyogenes NOT DETECTED NOT DETECTED Final   Acinetobacter baumannii NOT DETECTED NOT DETECTED Final   Enterobacteriaceae species DETECTED (A) NOT DETECTED Final    Comment: Enterobacteriaceae represent a large family of gram-negative bacteria, not a single organism. CRITICAL RESULT CALLED TO, READ BACK BY AND VERIFIED WITH: Kelby FamF WILSON PHARMD 1434 02/03/19 A BROWNING    Enterobacter cloacae complex NOT DETECTED NOT DETECTED Final   Escherichia coli DETECTED (A) NOT DETECTED Final    Comment: CRITICAL RESULT CALLED TO, READ BACK BY AND VERIFIED WITH: Kelby FamF WILSON PHARMD 1434 02/03/19 A BROWNING    Klebsiella oxytoca NOT DETECTED NOT DETECTED Final   Klebsiella pneumoniae NOT DETECTED NOT DETECTED Final   Proteus species NOT DETECTED NOT DETECTED Final   Serratia marcescens NOT DETECTED NOT DETECTED Final   Carbapenem resistance NOT DETECTED NOT DETECTED Final   Haemophilus influenzae NOT DETECTED NOT DETECTED Final   Neisseria meningitidis NOT DETECTED NOT DETECTED Final   Pseudomonas aeruginosa NOT DETECTED NOT DETECTED Final   Candida albicans NOT DETECTED NOT DETECTED Final   Candida glabrata NOT DETECTED NOT DETECTED Final   Candida krusei NOT DETECTED NOT DETECTED Final   Candida parapsilosis NOT DETECTED NOT DETECTED Final   Candida tropicalis NOT DETECTED NOT DETECTED Final    Comment: Performed at [facility], [address] [place], [place] [address]  Blood culture (routine x 2)     Status: Abnormal   Collection Time: 02/02/19  8:05 PM   Specimen: BLOOD LEFT ARM  Result Value Ref Range Status   Specimen Description BLOOD LEFT ARM  Final   Special Requests   Final    BOTTLES DRAWN AEROBIC AND ANAEROBIC Blood Culture adequate volume   Culture  Setup Time   Final    IN BOTH AEROBIC AND ANAEROBIC BOTTLES GRAM NEGATIVE RODS CRITICAL VALUE NOTED.  VALUE IS CONSISTENT WITH PREVIOUSLY REPORTED AND CALLED VALUE.    Culture (A)  Final    ESCHERICHIA COLI SUSCEPTIBILITIES  PERFORMED ON PREVIOUS CULTURE WITHIN THE LAST 5 DAYS. Performed at [facility], [address]  [place], [place] [address]    Report Status 02/06/2019 FINAL  Final  Urine culture     Status: Abnormal   Collection Time: 02/02/19  8:36 PM   Specimen: In/Out Cath Urine  Result Value Ref Range Status   Specimen Description IN/OUT CATH URINE  Final   Special Requests   Final    NONE Performed at [facility], [address], [place], [place] [address]    Culture MULTIPLE SPECIES PRESENT, SUGGEST RECOLLECTION (A)  Final   Report Status 02/03/2019 FINAL  Final  SARS CORONAVIRUS 2 (TAT 6-24 HRS) Nasopharyngeal Nasopharyngeal Swab     Status: None   Collection Time: 02/02/19 10:03 PM   Specimen: Nasopharyngeal Swab  Result Value Ref Range Status   SARS Coronavirus 2 NEGATIVE NEGATIVE Final    Comment: (NOTE) SARS-CoV-2 target nucleic acids are NOT DETECTED. The SARS-CoV-2 RNA is generally detectable in upper and lower respiratory specimens during the acute phase of infection. Negative results do not preclude SARS-CoV-2 infection, do not rule out co-infections with other pathogens, and should not be used as the sole basis for treatment or other patient management decisions. Negative results must be combined with clinical observations, patient history, and epidemiological information. The expected result is Negative. Fact Sheet for Patients: HairSlick.no Fact Sheet for Healthcare Providers: quierodirigir.com This test is not yet approved or cleared by the [place] FDA and  has been authorized for detection and/or diagnosis of SARS-CoV-2 by FDA under an Emergency Use Authorization (EUA). This EUA will remain  in effect (meaning this test can be used) for the duration of the COVID-19 declaration under Section 56 4(b)(1) of the Act, 21 U.S.C. section 360bbb-3(b)(1), unless the authorization is terminated or revoked  sooner. Performed at [facility], [address] [place], [place] [address]   MRSA PCR Screening     Status: None   Collection Time: 02/03/19  5:28 AM   Specimen: Nasal Mucosa; Nasopharyngeal  Result Value Ref Range Status   MRSA by PCR NEGATIVE NEGATIVE Final    Comment:        The GeneXpert MRSA Assay (FDA approved for NASAL specimens only), is one component of a comprehensive MRSA colonization surveillance program. It is not intended to diagnose MRSA infection nor to guide or monitor treatment for MRSA infections. Performed at [facility], [address] [place], [place] [address]   Culture, blood (routine x 2)     Status: None (Preliminary result)   Collection Time: 02/04/19  7:40 AM   Specimen: BLOOD RIGHT HAND  Result Value Ref Range Status   Specimen Description BLOOD RIGHT HAND  Final   Special Requests   Final    BOTTLES DRAWN AEROBIC ONLY Blood Culture results may not be optimal due to an inadequate volume of blood received in culture bottles   Culture   Final    NO GROWTH 3 DAYS Performed at [facility], [address], [place], [place] [address]    Report Status PENDING  Incomplete  Culture, blood (routine x 2)     Status: None (Preliminary result)   Collection Time: 02/04/19 10:17 AM   Specimen: BLOOD RIGHT HAND  Result Value Ref Range Status   Specimen Description BLOOD RIGHT HAND  Final   Special Requests   Final    BOTTLES DRAWN AEROBIC ONLY Blood Culture results may not be optimal due to an inadequate volume of blood received in culture bottles   Culture  Final    NO GROWTH 3 DAYS Performed at [facility], [address] [place], [place] [address]    Report Status PENDING  Incomplete      Studies: No results found.  Scheduled Meds: . diltiazem  180 mg Oral Daily  . ferrous sulfate  325 mg Oral BID  . gabapentin  300 mg Oral TID  . insulin aspart  0-9 Units Subcutaneous TID WC  . lisinopril  20 mg Oral Daily  .  metoprolol tartrate  50 mg Oral BID  . pravastatin  20 mg Oral q1800  . predniSONE  5 mg Oral Q breakfast  . warfarin  7.5 mg Oral ONCE-1800  . Warfarin - Pharmacist Dosing Inpatient   Does not apply q1800    Continuous Infusions: . sodium chloride 1,000 mL (02/06/19 1806)  .  ceFAZolin (ANCEF) IV 2 g (02/07/19 1359)     LOS: 5 days     Darlin Drop, MD [facility] Pager [phone]  If 7PM-7AM, please contact night-coverage [email or web address] Password [email or web address] 02/07/2019, 3:50 PM

## 2019-02-08 DIAGNOSIS — N183 Chronic kidney disease, stage 3 unspecified: Secondary | ICD-10-CM | POA: Diagnosis not present

## 2019-02-08 DIAGNOSIS — D696 Thrombocytopenia, unspecified: Secondary | ICD-10-CM | POA: Diagnosis not present

## 2019-02-08 DIAGNOSIS — I4821 Permanent atrial fibrillation: Secondary | ICD-10-CM | POA: Diagnosis not present

## 2019-02-08 DIAGNOSIS — A4151 Sepsis due to Escherichia coli [E. coli]: Secondary | ICD-10-CM | POA: Diagnosis not present

## 2019-02-08 DIAGNOSIS — I130 Hypertensive heart and chronic kidney disease with heart failure and stage 1 through stage 4 chronic kidney disease, or unspecified chronic kidney disease: Secondary | ICD-10-CM | POA: Diagnosis not present

## 2019-02-08 DIAGNOSIS — I5032 Chronic diastolic (congestive) heart failure: Secondary | ICD-10-CM | POA: Diagnosis not present

## 2019-02-08 DIAGNOSIS — E1122 Type 2 diabetes mellitus with diabetic chronic kidney disease: Secondary | ICD-10-CM | POA: Diagnosis not present

## 2019-02-08 DIAGNOSIS — N179 Acute kidney failure, unspecified: Secondary | ICD-10-CM | POA: Diagnosis not present

## 2019-02-08 LAB — CBC WITH DIFFERENTIAL/PLATELET
Abs Immature Granulocytes: 0.73 10*3/uL — ABNORMAL HIGH (ref 0.00–0.07)
Basophils Absolute: 0.1 10*3/uL (ref 0.0–0.1)
Basophils Relative: 0 %
Eosinophils Absolute: 0.1 10*3/uL (ref 0.0–0.5)
Eosinophils Relative: 0 %
HCT: 26.4 % — ABNORMAL LOW (ref 36.0–46.0)
Hemoglobin: 9 g/dL — ABNORMAL LOW (ref 12.0–15.0)
Immature Granulocytes: 4 %
Lymphocytes Relative: 14 %
Lymphs Abs: 2.6 10*3/uL (ref 0.7–4.0)
MCH: 28.2 pg (ref 26.0–34.0)
MCHC: 34.1 g/dL (ref 30.0–36.0)
MCV: 82.8 fL (ref 80.0–100.0)
Monocytes Absolute: 1.1 10*3/uL — ABNORMAL HIGH (ref 0.1–1.0)
Monocytes Relative: 6 %
Neutro Abs: 14.9 10*3/uL — ABNORMAL HIGH (ref 1.7–7.7)
Neutrophils Relative %: 76 %
Platelets: 219 10*3/uL (ref 150–400)
RBC: 3.19 MIL/uL — ABNORMAL LOW (ref 3.87–5.11)
RDW: 16.7 % — ABNORMAL HIGH (ref 11.5–15.5)
WBC: 19.5 10*3/uL — ABNORMAL HIGH (ref 4.0–10.5)
nRBC: 0 % (ref 0.0–0.2)

## 2019-02-08 LAB — GLUCOSE, CAPILLARY
Glucose-Capillary: 90 mg/dL (ref 70–99)
Glucose-Capillary: 130 mg/dL — ABNORMAL HIGH (ref 70–99)
Glucose-Capillary: 119 mg/dL — ABNORMAL HIGH (ref 70–99)
Glucose-Capillary: 120 mg/dL — ABNORMAL HIGH (ref 70–99)
Glucose-Capillary: 99 mg/dL (ref 70–99)

## 2019-02-08 LAB — PROTIME-INR
INR: 3.3 — ABNORMAL HIGH (ref 0.8–1.2)
Prothrombin Time: 33.3 seconds — ABNORMAL HIGH (ref 11.4–15.2)

## 2019-02-08 MED ORDER — METOPROLOL TARTRATE 25 MG PO TABS: 25.0000 mg | ORAL_TABLET | Freq: Two times a day (BID) | ORAL | 0 refills | Status: DC

## 2019-02-08 MED ORDER — WARFARIN SODIUM 2.5 MG PO TABS
2.5000 mg | ORAL_TABLET | Freq: Once | ORAL | Status: AC
  Administered 2019-02-08: 2.5 mg via ORAL
  Filled 2019-02-08: qty 1

## 2019-02-08 MED ORDER — CEPHALEXIN 500 MG PO CAPS: 500.0000 mg | ORAL_CAPSULE | Freq: Four times a day (QID) | ORAL | 0 refills | Status: AC

## 2019-02-08 MED ORDER — DILTIAZEM HCL ER COATED BEADS 120 MG PO CP24: 120.0000 mg | ORAL_CAPSULE | Freq: Every day | ORAL | 0 refills | Status: DC

## 2019-02-08 MED FILL — CEPHALEXIN 500 MG CAPSULE: 500 | 3 days supply | Qty: 12 | Fill #0

## 2019-02-08 MED FILL — METOPROLOL TARTRATE 25 MG T: 25 | 30 days supply | Qty: 60 | Fill #0

## 2019-02-08 MED FILL — CARTIA XT 120 MG CP24: 120 | 30 days supply | Qty: 30 | Fill #0

## 2019-02-08 NOTE — Progress Notes (Signed)
[place] for Coumadin Indication: atrial fibrillation  Allergies  Allergen Reactions  . Aspirin Hives and Swelling    Angioedema   . Rofecoxib Hives  . Hydrocodone Hives    Tolerates oxycodone and tramadol    Patient Measurements: Height: 5\' 7"  (170.2 cm) Weight: (!) 352 lb 11.8 oz (160 kg) IBW/kg (Calculated) : 61.6  Vital Signs: Temp: 98.8 F (37.1 C) (11/04 0559) Temp Source: Oral (11/04 0559) BP: 115/71 (11/04 0559) Pulse Rate: 49 (11/04 0559)  Labs: Recent Labs    02/06/19 0232 02/07/19 0233 02/07/19 0641 02/08/19 0237  HGB 9.5*  --  9.8* 9.0*  HCT 27.9*  --  29.2* 26.4*  PLT 160  --  171 219  LABPROT 26.4* 30.7*  --  33.3*  INR 2.5* 3.0*  --  3.3*  CREATININE  --   --  0.96  --     Estimated Creatinine Clearance: 75.8 mL/min (by C-G formula based on SCr of 0.96 mg/dL).   Medical History: Past Medical History:  Diagnosis Date  . AKI (acute kidney injury) ([place]) 04/2016  . Anemia   . Arthritis   . Chronic atrial fibrillation ([place])   . Chronic edema   . Chronic venous insufficiency   . CKD (chronic kidney disease), stage III   . Coagulopathy ([place])   . Diabetes mellitus    Type 2  . Diverticulosis   . Dyslipidemia   . GERD (gastroesophageal reflux disease)   . GI bleed 2015   a. lower GI from diverticular source 2015.  [name] Hx of transfusion of packed red blood cells   . Hypertension   . Morbid obesity ([place])   . Obstructive sleep apnea    on CPAP  . Pulmonary hypertension ([place])   . Pyelonephritis 04/2016  . Renal infarct ([place])    a. 04/2016- adm with flank pain, ? pyelo vs renal infarct in setting of recent subtherapeutic INR.  [name] Sinus bradycardia   . Stroke [facility]) 2015   Assessment: 45 yof on warfarin PTA for afib/CVA initally held in case ortho procedure. INR 1.2 subtherapeutic on admit 10/29.   Resumed warfarin 10/30.  - PTA dosing 10mg  WSS, 7.5mg  TTThF with admit INR 1.2, last dose taken pta  10/28.   Today INR = 3.3-supratherapeutic , warfarin dose held on 11/2.  The larger increase seen on 11/2 likely due to DDI with metronidazole 10/29>11/1 (metronidazole  can increase warfarin hyprothrombinemic effect).  Now off metronidazole. Hgb 9 low stable, pltc 219 improved.   No bleeding documented.    Goal of Therapy:  INR 2-3 Monitor platelets by anticoagulation protocol: Yes   Plan:  Coumadin 2.5 mg today.  Daily INR Monitor daily CBC  Jessenya Berdan A. Levada Dy, PharmD, BCPS, FNKF Clinical Pharmacist [place] Please utilize Amion for appropriate phone number to reach the unit pharmacist ([place])   02/08/2019,8:07 AM

## 2019-02-08 NOTE — TOC Transition Note (Signed)
Transition of Care [facility]) - CM/SW Discharge Note   Patient Details  Name: Robin Snow MRN: [id number] Date of Birth: Dec 10, 1940  [facility] CM/SW Contact:  Benard Halsted, LCSW Phone Number: 02/08/2019, 2:00 PM   Clinical Narrative:    Patient will DC to: Home Anticipated DC date: 02/08/19 Family notified: Daughter Transport by: Corey Harold 7:30pm (Daughter not home until then)   Per MD patient ready for DC home. DC packet on chart. Ambulance transport requested for patient.   CSW will sign off for now as social work intervention is no longer needed. Please consult [place] again if new needs arise.  Cedric Fishman, LCSW Clinical Social Worker [phone]    Final next level of care: Home w Home Health Services Barriers to Discharge: No Barriers Identified   Patient Goals and CMS Choice Patient states their goals for this hospitalization and ongoing recovery are:: Return home CMS Medicare.gov Compare Post Acute Care list provided to:: Patient Represenative (must comment)(Daughter) Choice offered to / list presented to : Adult Children  Discharge Placement                       Discharge Plan and Services In-house Referral: NA Discharge Planning Services: CM Consult Post Acute Care Choice: Home Health          DME Arranged: Wheelchair manual DME Agency: Other - Comment([employer]) Date DME Agency Contacted: 02/07/19 Time DME Agency Contacted: [phone] Representative spoke with at DME Agency: Suanne Marker HH Arranged: PT, OT, Nurse's Aide [place] Agency: [place] Date [place]: 02/06/19 Time [place]: 1600 Representative spoke with at [place]: [place] ([place]) Interventions     Readmission Risk Interventions No flowsheet data found.

## 2019-02-08 NOTE — TOC Transition Note (Signed)
Transition of Care [facility]) - CM/SW Discharge Note   Patient Details  Name: AMBERLEE GARVEY MRN: [id number] Date of Birth: [birth year]  Transition of Care [facility]) CM/SW Contact:  Benard Halsted, LCSW Phone Number: 02/08/2019, 12:10 PM   Clinical Narrative:    CSW alerted [facility] of patient's discharge. No other needs identified at this time.    Final next level of care: [place] Barriers to Discharge: No Barriers Identified   Patient Goals and CMS Choice Patient states their goals for this hospitalization and ongoing recovery are:: Return home CMS Medicare.gov Compare Post Acute Care list provided to:: Patient Represenative (must comment)(Daughter) Choice offered to / list presented to : Adult Children  Discharge Placement                       Discharge Plan and Services In-house Referral: NA Discharge Planning Services: CM Consult Post Acute Care Choice: Home Health          DME Arranged: Wheelchair manual DME Agency: Other - Comment([employer]) Date DME Agency Contacted: 02/07/19 Time DME Agency Contacted: [phone] Representative spoke with at DME Agency: Suanne Marker HH Arranged: PT, OT, Nurse's Aide [place] Agency: [place] Date [place]: 02/06/19 Time [place]: 1600 Representative spoke with at [place]: [place] ([place]) Interventions     Readmission Risk Interventions No flowsheet data found.

## 2019-02-08 NOTE — Progress Notes (Signed)
Pt discharged home with PTAR. VSS and no apparent distress. Handoff given to PTAR. IV and telemetry d/c'd by day shift RN. Pt daughter, Janett Billow, called and notified of pt PTAR pick-up.

## 2019-02-08 NOTE — Progress Notes (Signed)
Physical Therapy Treatment Patient Details Name: Robin Snow MRN: [id number] DOB: 1941-01-26 Today's Date: 02/08/2019    History of Present Illness 78 y.o. female with history of atrial fibrillation, hypertension, diabetes mellitus, chronic kidney disease stage III admitted via ED after fall at home when patient was trying to get out of the bed.  Patient states she did not hit her head or lose consciousness.  She fell onto her right knee.  Patient as per the daughter with whom I spoke has been having chronic issues with the right knee and swelling which patient cannot place weight on. Xray R knee osteomyelitis, sepsis, evidence of chronic infection, referred for OP follow up per Ortho MD.    PT Comments    Patient received in bed, agrees to PT. Reports she continues to have right knee pain. Increased time needed with all mobility. Required mod assist to scoot hips forward in bed. Min assist with R LE due to knee pain. Patient is able to stand with min guard and increased time. She ambulated to recliner 5' with RW and min guard. Patient will continue to benefit from skilled PT to improve strength and functional mobility.       Follow Up Recommendations  Home health PT;Supervision/Assistance - 24 hour     Equipment Recommendations       Recommendations for Other Services       Precautions / Restrictions Precautions Precautions: Fall Precaution Comments: knee pain RLE, obesity, weakness Restrictions Weight Bearing Restrictions: No    Mobility  Bed Mobility Overal bed mobility: Needs Assistance Bed Mobility: Sit to Supine;Supine to Sit     Supine to sit: Mod assist;HOB elevated     General bed mobility comments: increased time for all mobility, required assist to scoot hips forward in bed. Assist needed with R LE due to pain.  Transfers Overall transfer level: Needs assistance Equipment used: Rolling walker (2 wheeled) Transfers: Sit to/from Stand Sit to Stand: From  elevated surface;Min assist         General transfer comment: from elevated surface, patient requires increased time and effort to perform transfer. Supervision for safety.  Ambulation/Gait Ambulation/Gait assistance: Min guard Gait Distance (Feet): 5 Feet Assistive device: Rolling walker (2 wheeled) Gait Pattern/deviations: Step-to pattern;Decreased stride length;Trunk flexed Gait velocity: decreased   General Gait Details: patient able to ambulate with slow steady pace, supervision to min guard for safety   Stairs             Wheelchair Mobility    Modified Rankin (Stroke Patients Only)       Balance Overall balance assessment: Needs assistance Sitting-balance support: Feet supported;Single extremity supported Sitting balance-Leahy Scale: Good     Standing balance support: Bilateral upper extremity supported;During functional activity Standing balance-Leahy Scale: Fair Standing balance comment: relaint on at least 1 UE support statically, BUE dynaimcally                            Cognition Arousal/Alertness: Awake/alert Behavior During Therapy: WFL for tasks assessed/performed Overall Cognitive Status: Within Functional Limits for tasks assessed                                 General Comments: alert and oriented, slow processing but appears [facility]       Exercises      General Comments        Pertinent Vitals/Pain  Pain Assessment: Faces Faces Pain Scale: Hurts whole lot Pain Location: R knee Pain Descriptors / Indicators: Grimacing;Guarding;Discomfort;Sore;Aching Pain Intervention(s): Limited activity within patient's tolerance;Monitored during session;Repositioned    Home Living                      Prior Function            PT Goals (current goals can now be found in the care plan section) Acute Rehab PT Goals Patient Stated Goal: decrease pain, get home to bake pies PT Goal Formulation: With patient Time  For Goal Achievement: 02/19/19 Potential to Achieve Goals: Fair Progress towards PT goals: Progressing toward goals    Frequency    Min 3X/week      PT Plan Current plan remains appropriate    Co-evaluation              AM-PAC PT "6 Clicks" Mobility   Outcome Measure  Help needed turning from your back to your side while in a flat bed without using bedrails?: A Lot Help needed moving from lying on your back to sitting on the side of a flat bed without using bedrails?: A Lot Help needed moving to and from a bed to a chair (including a wheelchair)?: A Little Help needed standing up from a chair using your arms (e.g., wheelchair or bedside chair)?: A Little Help needed to walk in hospital room?: A Little Help needed climbing 3-5 steps with a railing? : A Lot 6 Click Score: 15    End of Session Equipment Utilized During Treatment: Gait belt Activity Tolerance: Patient tolerated treatment well;Patient limited by pain Patient left: in chair;with chair alarm set;with call bell/phone within reach Nurse Communication: Mobility status PT Visit Diagnosis: Pain;Difficulty in walking, not elsewhere classified (R26.2);Other abnormalities of gait and mobility (R26.89);History of falling (Z91.81) Pain - Right/Left: Right Pain - part of body: Knee     Time: 1000-1033 PT Time Calculation (min) (ACUTE ONLY): 33 min  Charges:  $Gait Training: 23-37 mins                     Leana Springston, PT, GCS 02/08/19,11:25 AM

## 2019-02-08 NOTE — Discharge Summary (Signed)
Physician Discharge Summary  Robin Snow ZOX:[id number] DOB: 1941-02-21 DOA: 02/02/2019  PCP: [name], MD  Admit date: 02/02/2019 Discharge date: 02/08/2019  Admitted From: Home Disposition:  Home  Recommendations for Outpatient Follow-up:  1. Follow up with PCP in 1 week 2. Follow up with Dr. Valentina Lucks, Orthopedic surgery  3. Please obtain CBC in 1 week to check on leukocytosis  Home Health: PT OT Aide   Discharge Condition: Stable CODE STATUS: Full  Diet recommendation: Heart healthy, carb modified   Brief/Interim Summary: Robin Casstevens Howellis a 78 y.o.femalewithhistory of atrial fibrillation, hypertension, diabetes mellitus, chronic kidney disease stage III had a fall at home when patient was trying to get out of the bed. Patient states she did not hit her head or lose consciousness. She fell onto her right knee. Patient has been having chronic issues with the right knee and swelling which patient cannot place weight on. Given the fall and increasing pain in the right knee patient was brought to the ER.  ED Course:In the ER patient was febrile with temperature of 101.73F lab work show WBC count of 21.8 hemoglobin 11.1 platelets 147 creatinine 1.6 lactic acid was 2.1 which improved to 1.6 CRP was 19.7 UA was consistent with UTI. Right knee was showing features concerning for osteomyelitis and septic arthritis. On-call orthopedic surgeon Dr. Jena Gauss was consulted. Chest x-ray was showing chronic changes. Patient was empirically started on antibiotic for sepsis. At presentation patient was in A. fib with RVR and rate improved with IV fluid bolus.    Orthopedic surgery did not feel that her right knee was source of her acute infectious process.  They recommended patient follow-up with her outpatient orthopedic surgeon in [place].  Blood culture returned with E. coli bacteremia, patient was treated with IV Rocephin.  Remained afebrile > 48 hours.   Discharge  Diagnoses:  Principal Problem:   Sepsis (HCC) Active Problems:   Essential hypertension   Diabetes mellitus with complication (HCC)   CKD (chronic kidney disease) stage 3, GFR 30-59 ml/min   Chronic diastolic CHF (congestive heart failure) (HCC)   Warm antibody hemolytic anemia   Atrial fibrillation with RVR (HCC)   Sepsis secondary to E. coli bacteremia  -Presented with a fever, tachycardia and tachypnea, blood culture drawn on 02/02/2019 + E. coli -Repeated blood cultures x2 on 02/04/2019 - negative to date  -Continue Rocephin --> Ancef --> Keflex on discharge total 10 day course  -Patient continues to have a leukocytosis, at the time of admission WBC 20.5 and this morning 19.5.  Leukocytosis may also be contributed by acute on chronic right knee pain.  Patient has remained afebrile with repeat blood cultures negative.  She is recommended to follow-up with PCP for repeat blood work as an outpatient.  Acute metabolic encephalopathy likely in the setting of acute illness  -CT head on 02/02/2019 showed negative for acute intracranial hemorrhage or mass possible age indeterminate lacunar infarct right cerebellum.  Atrophy and small vessel ischemic changes of the white matter. -Stable  Chronic right knee pain -Consulted orthopedic surgery, they do not feel that her right knee pain is a cause of sepsis.  Appear that x-ray showed chronic changes that may be consistent with chronic infection in that knee.  Recommended to follow-up with orthopedic surgeon Dr. Valentina Lucks at discharge  Presumed UTI -Urine culture taken on 02/02/2019 with multiple species although urinalysis was indicative of urinary tract infection.  Patient treated with IV antibiotic as above  Permanent A. fib with  RVR. -RVR is improved.   Rate is currently controlled on diltiazem and Lopressor.  On Coumadin for CVA prevention.    Type II diabetes mellitus type, well controlled -Ha1c 5.8 on 02/03/2019  Hypertension -BP  stable this morning 132/67   AKI -Creatinine appears to be at baseline 0.9 with GFR greater than 60 on 02/07/2019.  -Presented with Cr. 1.39 and GFR 42.Note that patient is on lisinopril.  -Resolved   Chronic normocytic anemia and thrombocytopenia  -With history of autoimmune hemolytic anemia -Patient is on prednisone -Stable   Hyperlipidemia -Continue on statins.    Severe morbid obesity Estimated body mass index is 55.25 kg/m as calculated from the following:   Height as of this encounter: 5\' 7"  (1.702 m).   Weight as of this encounter: 160 kg.     Discharge Instructions  Discharge Instructions    Call MD for:  difficulty breathing, headache or visual disturbances   Complete by: As directed    Call MD for:  extreme fatigue   Complete by: As directed    Call MD for:  persistant dizziness or light-headedness   Complete by: As directed    Call MD for:  persistant nausea and vomiting   Complete by: As directed    Call MD for:  severe uncontrolled pain   Complete by: As directed    Call MD for:  temperature >100.4   Complete by: As directed    Diet - low sodium heart healthy   Complete by: As directed    Diet Carb Modified   Complete by: As directed    Discharge instructions   Complete by: As directed    You were cared for by a hospitalist during your hospital stay. If you have any questions about your discharge medications or the care you received while you were in the hospital after you are discharged, you can call the unit and ask to speak with the hospitalist on call if the hospitalist that took care of you is not available. Once you are discharged, your primary care physician will handle any further medical issues. Please note that NO REFILLS for any discharge medications will be authorized once you are discharged, as it is imperative that you return to your primary care physician (or establish a relationship with a primary care physician if you do not have one)  for your aftercare needs so that they can reassess your need for medications and monitor your lab values.   Increase activity slowly   Complete by: As directed      Allergies as of 02/08/2019      Reactions   Aspirin Hives, Swelling   Angioedema   Rofecoxib Hives   Hydrocodone Hives   Tolerates oxycodone and tramadol      Medication List    STOP taking these medications   furosemide 40 MG tablet Commonly known as: LASIX   hydrochlorothiazide 12.5 MG tablet Commonly known as: HYDRODIURIL   lisinopril 20 MG tablet Commonly known as: ZESTRIL   potassium chloride SA 20 MEQ tablet Commonly known as: KLOR-CON     TAKE these medications   cephALEXin 500 MG capsule Commonly known as: KEFLEX Take 1 capsule (500 mg total) by mouth 4 (four) times daily for 3 days.   diltiazem 120 MG 24 hr capsule Commonly known as: Cartia XT Take 1 capsule (120 mg total) by mouth daily. What changed:   medication strength  how much to take  additional instructions   ferrous sulfate 325 (65  FE) MG EC tablet TAKE ONE TABLET BY MOUTH TWICE DAILY   gabapentin 300 MG capsule Commonly known as: NEURONTIN Take 1 capsule (300 mg total) by mouth 3 (three) times daily. Need appointment before next refill   ibuprofen 200 MG tablet Commonly known as: ADVIL Take 400 mg by mouth every 6 (six) hours as needed for headache or mild pain.   lovastatin 20 MG tablet Commonly known as: MEVACOR Take 1 tablet (20 mg total) by mouth at bedtime. Need appointment for further refills   meclizine 25 MG tablet Commonly known as: ANTIVERT Take 1 tablet (25 mg total) by mouth every 4 (four) hours as needed for dizziness.   metoprolol tartrate 25 MG tablet Commonly known as: LOPRESSOR Take 1 tablet (25 mg total) by mouth 2 (two) times daily. What changed:   medication strength  how much to take  additional instructions   predniSONE 5 MG tablet Commonly known as: DELTASONE Take 1 tablet (5 mg total)  by mouth daily with breakfast.   warfarin 5 MG tablet Commonly known as: COUMADIN Take as directed. If you are unsure how to take this medication, talk to your nurse or doctor. Original instructions: TAKE 1 TABLET BY MOUTH DAILY EXCEPT 1 & 1/2 TABLETS ON WEDNESDAY OR AS DIRECTED BY ANTICOAGULATION CLINIC What changed:   how much to take  how to take this  when to take this  additional instructions            Durable Medical Equipment  (From admission, onward)         Start     Ordered   02/06/19 1439  For home use only DME standard manual wheelchair with seat cushion  Once    Comments: Patient suffers from ambulatory dysfunction which impairs their ability to perform daily activities like walking in the home.  A walking aid will not resolve issue with performing activities of daily living. A wheelchair will allow patient to safely perform daily activities. Patient can safely propel the wheelchair in the home or has a caregiver who can provide assistance. Length of need 6 months. Accessories: elevating leg rests, wheel locks, extensions and anti-tippers.   02/06/19 1438         Follow-up Information    [facility] Follow up.   Specialty: Home Health Services Why: [place] arranged.  Contact information: [address] [place] [address] [phone]        [facility] Follow up.   Why: Wheelchair to be delivered to room prior to discharge.  Contact information: [address] [place] [address] [phone]        [name], MD. Schedule an appointment as soon as possible for a visit in 1 week(s).   Specialty: Internal Medicine Contact information: [place] [address] [phone]        Griffin,William. Schedule an appointment as soon as possible for a visit.   Why: Follow up with your orthopedic surgeon regarding chronic right knee pain           Allergies  Allergen Reactions  . Aspirin Hives and Swelling    Angioedema   . Rofecoxib Hives  . Hydrocodone Hives    Tolerates oxycodone and tramadol    Consultations:  Orthopedic surgery    Procedures/Studies: Dg Knee 2 Views Right  Result Date: 02/02/2019 CLINICAL DATA:  Pain, concern for infection EXAM: RIGHT KNEE - 1-2 VIEW COMPARISON:  CT knee 06/09/2017 FINDINGS: Extensive circumferential soft tissue swelling with a more large effusion and destructive change in lucency of the bone surrounding cemented femoral stem of the patient's total knee arthroplasty. Some destructive changes are noted near the posterior aspect of the tibial hardware is well. Alignment of the arthroplasty components is maintained. Sclerotic changes of the patella are more pronounced than on comparison exam. IMPRESSION: 1. Findings suspicious for septic arthritis and osteomyelitic changes of the distal femur, proximal tibia and patella 2. Hardware remains normally aligned. 3. Extensive circumferential soft tissue swelling. Electronically Signed   By: Kreg Shropshire M.D.   On: 02/02/2019 21:25   Ct Head Wo Contrast  Result Date: 02/02/2019 CLINICAL DATA:  Fall altered mentation EXAM: CT HEAD WITHOUT CONTRAST CT CERVICAL SPINE WITHOUT CONTRAST TECHNIQUE: Multidetector CT imaging of the head and cervical spine was performed following the standard protocol without intravenous contrast. Multiplanar CT image reconstructions of the cervical spine were also generated. COMPARISON:  None. FINDINGS: CT HEAD FINDINGS Brain: No acute territorial infarction, hemorrhage or intracranial mass. Atrophy and moderate small vessel ischemic changes of the white matter. Nonenlarged ventricles. Age indeterminate lacunar infarct in the right cerebellum. Vascular: No hyperdense vessels.  Carotid vascular calcification Skull: Normal. Negative for fracture or focal lesion. Sinuses/Orbits: No acute finding. Other: None CT CERVICAL SPINE  FINDINGS Alignment: No subluxation.  Facet alignment within normal limits Skull base and vertebrae: No acute fracture. No primary bone lesion or focal pathologic process. Soft tissues and spinal canal: No prevertebral fluid or swelling. No visible canal hematoma. Disc levels: Moderate diffuse degenerative change throughout the cervical spine with multiple level disc space narrowing and osteophyte. Posterior disc osteophyte complex C5-C6 and C6-C7 with indentation of anterior thecal sac. Facet degenerative change at multiple levels. Upper chest: Negative. Other: None IMPRESSION: 1. Negative for acute intracranial hemorrhage or mass. Possible age indeterminate lacunar infarct right cerebellum. Atrophy and small vessel ischemic changes of the white matter 2. Diffuse degenerative changes of the cervical spine. No acute osseous abnormality Electronically Signed   By: Jasmine Pang M.D.   On: 02/02/2019 20:18   Ct Cervical Spine Wo Contrast  Result Date: 02/02/2019 CLINICAL DATA:  Fall altered mentation EXAM: CT HEAD WITHOUT CONTRAST CT CERVICAL SPINE WITHOUT CONTRAST TECHNIQUE: Multidetector CT imaging of the head and cervical spine was performed following the standard protocol without intravenous contrast. Multiplanar CT image reconstructions of the cervical spine were also generated. COMPARISON:  None. FINDINGS: CT HEAD FINDINGS Brain: No acute territorial infarction, hemorrhage or intracranial mass. Atrophy and moderate small vessel ischemic changes of the white matter. Nonenlarged ventricles. Age indeterminate lacunar infarct in the right cerebellum. Vascular: No hyperdense vessels.  Carotid vascular calcification Skull: Normal. Negative for fracture or focal lesion. Sinuses/Orbits: No acute finding. Other: None CT CERVICAL SPINE FINDINGS Alignment: No subluxation.  Facet alignment within normal limits Skull base and vertebrae: No acute fracture. No primary bone lesion or focal pathologic process. Soft tissues  and spinal canal: No prevertebral fluid or swelling. No visible canal hematoma. Disc levels: Moderate diffuse degenerative change throughout the cervical spine with multiple level disc space narrowing and osteophyte. Posterior disc osteophyte complex C5-C6 and C6-C7 with indentation of anterior thecal sac. Facet degenerative change at multiple levels. Upper chest: Negative. Other: None IMPRESSION: 1. Negative for acute intracranial hemorrhage or mass. Possible age indeterminate lacunar infarct right cerebellum. Atrophy and small vessel ischemic changes of the white matter 2. Diffuse degenerative changes of the cervical spine. No acute osseous abnormality  Electronically Signed   By: Jasmine Pang M.D.   On: 02/02/2019 20:18   Dg Chest Port 1 View  Result Date: 02/02/2019 CLINICAL DATA:  Fever, pain, concern for infection EXAM: PORTABLE CHEST 1 VIEW COMPARISON:  Radiograph 02/22/2017 FINDINGS: Dual lead pacer battery pack overlies the left chest wall with leads directed towards the cardiac apex and right atrium. Spinal stimulator wires terminate in the midthoracic spine. Severe dextrocurvature of the spine is again noted. Degenerative changes are present in the imaged spine and shoulders. There are coarse interstitial opacities throughout the lungs with superimposed low volumes and basilar atelectasis. No consolidation, features of edema, pneumothorax, or effusion. The aorta is calcified. Mild cardiomegaly, similar to prior accounting for differences in technique. IMPRESSION: 1. Chronic coarse interstitial opacities throughout the lungs with superimposed low volumes and basilar atelectasis. 2. No consolidation. Electronically Signed   By: Kreg Shropshire M.D.   On: 02/02/2019 21:22       Discharge Exam: Vitals:   02/08/19 0559 02/08/19 0902  BP: 115/71 132/67  Pulse: (!) 49 60  Resp: 18   Temp: 98.8 F (37.1 C)   SpO2: 100%     General: Pt is alert, awake, not in acute distress Cardiovascular: RR,  paced rhythm on telemetry, S1/S2 +, no edema Respiratory: CTA bilaterally, no wheezing, no rhonchi, no respiratory distress, no conversational dyspnea  Abdominal: Soft, NT, ND, bowel sounds + Extremities: +right knee effusion and TTP  Psych: Stable     The results of significant diagnostics from this hospitalization (including imaging, microbiology, ancillary and laboratory) are listed below for reference.     Microbiology: Recent Results (from the past 240 hour(s))  Blood culture (routine x 2)     Status: Abnormal   Collection Time: 02/02/19  8:00 PM   Specimen: BLOOD RIGHT ARM  Result Value Ref Range Status   Specimen Description BLOOD RIGHT ARM  Final   Special Requests   Final    BOTTLES DRAWN AEROBIC AND ANAEROBIC Blood Culture adequate volume   Culture  Setup Time   Final    GRAM NEGATIVE RODS IN BOTH AEROBIC AND ANAEROBIC BOTTLES CRITICAL RESULT CALLED TO, READ BACK BY AND VERIFIED WITHKelby Fam PHARMD 1434 02/03/19 A BROWNING Performed at [facility], [address] [place], [place] [address]    Culture ESCHERICHIA COLI (A)  Final   Report Status 02/05/2019 FINAL  Final   Organism ID, Bacteria ESCHERICHIA COLI  Final      Susceptibility   Escherichia coli - MIC*    AMPICILLIN >=32 RESISTANT Resistant     CEFAZOLIN <=4 SENSITIVE Sensitive     CEFEPIME <=1 SENSITIVE Sensitive     CEFTAZIDIME <=1 SENSITIVE Sensitive     CEFTRIAXONE <=1 SENSITIVE Sensitive     CIPROFLOXACIN <=0.25 SENSITIVE Sensitive     GENTAMICIN <=1 SENSITIVE Sensitive     IMIPENEM <=0.25 SENSITIVE Sensitive     TRIMETH/SULFA >=320 RESISTANT Resistant     AMPICILLIN/SULBACTAM >=32 RESISTANT Resistant     PIP/TAZO <=4 SENSITIVE Sensitive     Extended ESBL NEGATIVE Sensitive     * ESCHERICHIA COLI  Blood Culture ID Panel (Reflexed)     Status: Abnormal   Collection Time: 02/02/19  8:00 PM  Result Value Ref Range Status   Enterococcus species NOT DETECTED NOT DETECTED Final   Listeria  monocytogenes NOT DETECTED NOT DETECTED Final   Staphylococcus species NOT DETECTED NOT DETECTED Final   Staphylococcus aureus (BCID) NOT DETECTED NOT DETECTED  Final   Streptococcus species NOT DETECTED NOT DETECTED Final   Streptococcus agalactiae NOT DETECTED NOT DETECTED Final   Streptococcus pneumoniae NOT DETECTED NOT DETECTED Final   Streptococcus pyogenes NOT DETECTED NOT DETECTED Final   Acinetobacter baumannii NOT DETECTED NOT DETECTED Final   Enterobacteriaceae species DETECTED (A) NOT DETECTED Final    Comment: Enterobacteriaceae represent a large family of gram-negative bacteria, not a single organism. CRITICAL RESULT CALLED TO, READ BACK BY AND VERIFIED WITH: Kelby Fam PHARMD 1434 02/03/19 A BROWNING    Enterobacter cloacae complex NOT DETECTED NOT DETECTED Final   Escherichia coli DETECTED (A) NOT DETECTED Final    Comment: CRITICAL RESULT CALLED TO, READ BACK BY AND VERIFIED WITH: Kelby Fam PHARMD 1434 02/03/19 A BROWNING    Klebsiella oxytoca NOT DETECTED NOT DETECTED Final   Klebsiella pneumoniae NOT DETECTED NOT DETECTED Final   Proteus species NOT DETECTED NOT DETECTED Final   Serratia marcescens NOT DETECTED NOT DETECTED Final   Carbapenem resistance NOT DETECTED NOT DETECTED Final   Haemophilus influenzae NOT DETECTED NOT DETECTED Final   Neisseria meningitidis NOT DETECTED NOT DETECTED Final   Pseudomonas aeruginosa NOT DETECTED NOT DETECTED Final   Candida albicans NOT DETECTED NOT DETECTED Final   Candida glabrata NOT DETECTED NOT DETECTED Final   Candida krusei NOT DETECTED NOT DETECTED Final   Candida parapsilosis NOT DETECTED NOT DETECTED Final   Candida tropicalis NOT DETECTED NOT DETECTED Final    Comment: Performed at [facility], [address] [place], [place] [address]  Blood culture (routine x 2)     Status: Abnormal   Collection Time: 02/02/19  8:05 PM   Specimen: BLOOD LEFT ARM  Result Value Ref Range Status   Specimen Description BLOOD  LEFT ARM  Final   Special Requests   Final    BOTTLES DRAWN AEROBIC AND ANAEROBIC Blood Culture adequate volume   Culture  Setup Time   Final    IN BOTH AEROBIC AND ANAEROBIC BOTTLES GRAM NEGATIVE RODS CRITICAL VALUE NOTED.  VALUE IS CONSISTENT WITH PREVIOUSLY REPORTED AND CALLED VALUE.    Culture (A)  Final    ESCHERICHIA COLI SUSCEPTIBILITIES PERFORMED ON PREVIOUS CULTURE WITHIN THE LAST 5 DAYS. Performed at [facility], [address], [place], [place] [address]    Report Status 02/06/2019 FINAL  Final  Urine culture     Status: Abnormal   Collection Time: 02/02/19  8:36 PM   Specimen: In/Out Cath Urine  Result Value Ref Range Status   Specimen Description IN/OUT CATH URINE  Final   Special Requests   Final    NONE Performed at [facility], [address], [place], [place] [address]    Culture MULTIPLE SPECIES PRESENT, SUGGEST RECOLLECTION (A)  Final   Report Status 02/03/2019 FINAL  Final  SARS CORONAVIRUS 2 (TAT 6-24 HRS) Nasopharyngeal Nasopharyngeal Swab     Status: None   Collection Time: 02/02/19 10:03 PM   Specimen: Nasopharyngeal Swab  Result Value Ref Range Status   SARS Coronavirus 2 NEGATIVE NEGATIVE Final    Comment: (NOTE) SARS-CoV-2 target nucleic acids are NOT DETECTED. The SARS-CoV-2 RNA is generally detectable in upper and lower respiratory specimens during the acute phase of infection. Negative results do not preclude SARS-CoV-2 infection, do not rule out co-infections with other pathogens, and should not be used as the sole basis for treatment or other patient management decisions. Negative results must be combined with clinical observations, patient history, and epidemiological  information. The expected result is Negative. Fact Sheet for Patients: HairSlick.no Fact Sheet for Healthcare Providers: quierodirigir.com This test is not yet approved or cleared by the [place] FDA and   has been authorized for detection and/or diagnosis of SARS-CoV-2 by FDA under an Emergency Use Authorization (EUA). This EUA will remain  in effect (meaning this test can be used) for the duration of the COVID-19 declaration under Section 56 4(b)(1) of the Act, 21 U.S.C. section 360bbb-3(b)(1), unless the authorization is terminated or revoked sooner. Performed at [facility], [address] [place], [place] [address]   MRSA PCR Screening     Status: None   Collection Time: 02/03/19  5:28 AM   Specimen: Nasal Mucosa; Nasopharyngeal  Result Value Ref Range Status   MRSA by PCR NEGATIVE NEGATIVE Final    Comment:        The GeneXpert MRSA Assay (FDA approved for NASAL specimens only), is one component of a comprehensive MRSA colonization surveillance program. It is not intended to diagnose MRSA infection nor to guide or monitor treatment for MRSA infections. Performed at [facility], [address], [place], [place] [address]   Culture, blood (routine x 2)     Status: None (Preliminary result)   Collection Time: 02/04/19  7:40 AM   Specimen: BLOOD RIGHT HAND  Result Value Ref Range Status   Specimen Description BLOOD RIGHT HAND  Final   Special Requests   Final    BOTTLES DRAWN AEROBIC ONLY Blood Culture results may not be optimal due to an inadequate volume of blood received in culture bottles   Culture   Final    NO GROWTH 3 DAYS Performed at [facility], [address] [place], [place] [address]    Report Status PENDING  Incomplete  Culture, blood (routine x 2)     Status: None (Preliminary result)   Collection Time: 02/04/19 10:17 AM   Specimen: BLOOD RIGHT HAND  Result Value Ref Range Status   Specimen Description BLOOD RIGHT HAND  Final   Special Requests   Final    BOTTLES DRAWN AEROBIC ONLY Blood Culture results may not be optimal due to an inadequate volume of blood received in culture bottles   Culture   Final    NO GROWTH 3  DAYS Performed at [facility], [address], [place], [place] [address]    Report Status PENDING  Incomplete     Labs: BNP (last 3 results) No results for input(s): BNP in the last 8760 hours. Basic Metabolic Panel: Recent Labs  Lab 02/02/19 1931 02/03/19 0209 02/03/19 1157 02/04/19 0743 02/05/19 0320 02/07/19 0641  NA 142 141  --  143 141 139  K 3.7 5.8* 3.4* 3.6 3.5 3.6  CL 105 105  --  108 110 110  CO2 25 24  --  21* 22 21*  GLUCOSE 142* 121*  --  88 97 92  BUN 31* 30*  --  CREATININE 1.64* 1.39*  --  1.05* 1.01* 0.96  CALCIUM 8.9 8.5*  --  8.4* 8.3* 8.1*  MG 2.1  --   --   --   --  1.8   Liver Function Tests: Recent Labs  Lab 02/02/19 1931 02/04/19 0743  AST 28 15  ALT 16 13  ALKPHOS 91 79  BILITOT 1.4* 0.9  PROT 7.6 6.5  ALBUMIN 2.8* 2.2*   No results for input(s): LIPASE, AMYLASE in the  last 168 hours. No results for input(s): AMMONIA in the last 168 hours. CBC: Recent Labs  Lab 02/02/19 1923  02/04/19 0743 02/05/19 0320 02/06/19 0232 02/07/19 0641 02/08/19 0237  WBC 21.8*   < > 19.6* 17.8* 17.7* 18.7* 19.5*  NEUTROABS 17.4*  --   --   --  13.2* 13.7* 14.9*  HGB 11.1*   < > 9.6* 10.2* 9.5* 9.8* 9.0*  HCT 33.1*   < > 29.1* 30.0* 27.9* 29.2* 26.4*  MCV 84.0   < > 84.3 82.0 81.8 83.7 82.8  PLT 147*   < > PLATELET CLUMPS NOTED ON SMEAR, UNABLE TO ESTIMATE 129* 160 171 219   < > = values in this interval not displayed.   Cardiac Enzymes: No results for input(s): CKTOTAL, CKMB, CKMBINDEX, TROPONINI in the last 168 hours. BNP: Invalid input(s): POCBNP CBG: Recent Labs  Lab 02/07/19 0737 02/07/19 1207 02/07/19 1605 02/07/19 2143 02/08/19 0748  GLUCAP 106* 120* 114* 107* 90   D-Dimer No results for input(s): DDIMER in the last 72 hours. Hgb A1c No results for input(s): HGBA1C in the last 72 hours. Lipid Profile No results for input(s): CHOL, HDL, LDLCALC, TRIG, CHOLHDL, LDLDIRECT in the last 72 hours. Thyroid function  studies No results for input(s): TSH, T4TOTAL, T3FREE, THYROIDAB in the last 72 hours.  Invalid input(s): FREET3 Anemia work up No results for input(s): VITAMINB12, FOLATE, FERRITIN, TIBC, IRON, RETICCTPCT in the last 72 hours. Urinalysis    Component Value Date/Time   COLORURINE YELLOW 02/02/2019 1923   APPEARANCEUR CLEAR 02/02/2019 1923   LABSPEC 1.013 02/02/2019 1923   PHURINE 5.0 02/02/2019 1923   GLUCOSEU NEGATIVE 02/02/2019 1923   GLUCOSEU NEGATIVE 09/06/2017 1200   HGBUR SMALL (A) 02/02/2019 1923   BILIRUBINUR NEGATIVE 02/02/2019 1923   BILIRUBINUR Neg 10/18/2017 1453   KETONESUR NEGATIVE 02/02/2019 1923   PROTEINUR NEGATIVE 02/02/2019 1923   UROBILINOGEN 0.2 10/18/2017 1453   UROBILINOGEN 1.0 09/06/2017 1200   NITRITE POSITIVE (A) 02/02/2019 1923   LEUKOCYTESUR LARGE (A) 02/02/2019 1923   Sepsis Labs Invalid input(s): PROCALCITONIN,  WBC,  LACTICIDVEN Microbiology Recent Results (from the past 240 hour(s))  Blood culture (routine x 2)     Status: Abnormal   Collection Time: 02/02/19  8:00 PM   Specimen: BLOOD RIGHT ARM  Result Value Ref Range Status   Specimen Description BLOOD RIGHT ARM  Final   Special Requests   Final    BOTTLES DRAWN AEROBIC AND ANAEROBIC Blood Culture adequate volume   Culture  Setup Time   Final    GRAM NEGATIVE RODS IN BOTH AEROBIC AND ANAEROBIC BOTTLES CRITICAL RESULT CALLED TO, READ BACK BY AND VERIFIED WITHKelby Fam PHARMD 1434 02/03/19 A BROWNING Performed at [facility], [address] [place], [place] [address]    Culture ESCHERICHIA COLI (A)  Final   Report Status 02/05/2019 FINAL  Final   Organism ID, Bacteria ESCHERICHIA COLI  Final      Susceptibility   Escherichia coli - MIC*    AMPICILLIN >=32 RESISTANT Resistant     CEFAZOLIN <=4 SENSITIVE Sensitive     CEFEPIME <=1 SENSITIVE Sensitive     CEFTAZIDIME <=1 SENSITIVE Sensitive     CEFTRIAXONE <=1 SENSITIVE Sensitive     CIPROFLOXACIN <=0.25 SENSITIVE Sensitive      GENTAMICIN <=1 SENSITIVE Sensitive     IMIPENEM <=0.25 SENSITIVE Sensitive     TRIMETH/SULFA >=320 RESISTANT Resistant     AMPICILLIN/SULBACTAM >=32 RESISTANT Resistant  PIP/TAZO <=4 SENSITIVE Sensitive     Extended ESBL NEGATIVE Sensitive     * ESCHERICHIA COLI  Blood Culture ID Panel (Reflexed)     Status: Abnormal   Collection Time: 02/02/19  8:00 PM  Result Value Ref Range Status   Enterococcus species NOT DETECTED NOT DETECTED Final   Listeria monocytogenes NOT DETECTED NOT DETECTED Final   Staphylococcus species NOT DETECTED NOT DETECTED Final   Staphylococcus aureus (BCID) NOT DETECTED NOT DETECTED Final   Streptococcus species NOT DETECTED NOT DETECTED Final   Streptococcus agalactiae NOT DETECTED NOT DETECTED Final   Streptococcus pneumoniae NOT DETECTED NOT DETECTED Final   Streptococcus pyogenes NOT DETECTED NOT DETECTED Final   Acinetobacter baumannii NOT DETECTED NOT DETECTED Final   Enterobacteriaceae species DETECTED (A) NOT DETECTED Final    Comment: Enterobacteriaceae represent a large family of gram-negative bacteria, not a single organism. CRITICAL RESULT CALLED TO, READ BACK BY AND VERIFIED WITH: Kelby FamF WILSON PHARMD 1434 02/03/19 A BROWNING    Enterobacter cloacae complex NOT DETECTED NOT DETECTED Final   Escherichia coli DETECTED (A) NOT DETECTED Final    Comment: CRITICAL RESULT CALLED TO, READ BACK BY AND VERIFIED WITH: Kelby FamF WILSON PHARMD 1434 02/03/19 A BROWNING    Klebsiella oxytoca NOT DETECTED NOT DETECTED Final   Klebsiella pneumoniae NOT DETECTED NOT DETECTED Final   Proteus species NOT DETECTED NOT DETECTED Final   Serratia marcescens NOT DETECTED NOT DETECTED Final   Carbapenem resistance NOT DETECTED NOT DETECTED Final   Haemophilus influenzae NOT DETECTED NOT DETECTED Final   Neisseria meningitidis NOT DETECTED NOT DETECTED Final   Pseudomonas aeruginosa NOT DETECTED NOT DETECTED Final   Candida albicans NOT DETECTED NOT DETECTED Final   Candida  glabrata NOT DETECTED NOT DETECTED Final   Candida krusei NOT DETECTED NOT DETECTED Final   Candida parapsilosis NOT DETECTED NOT DETECTED Final   Candida tropicalis NOT DETECTED NOT DETECTED Final    Comment: Performed at [facility], [address] [place], [place] [address]  Blood culture (routine x 2)     Status: Abnormal   Collection Time: 02/02/19  8:05 PM   Specimen: BLOOD LEFT ARM  Result Value Ref Range Status   Specimen Description BLOOD LEFT ARM  Final   Special Requests   Final    BOTTLES DRAWN AEROBIC AND ANAEROBIC Blood Culture adequate volume   Culture  Setup Time   Final    IN BOTH AEROBIC AND ANAEROBIC BOTTLES GRAM NEGATIVE RODS CRITICAL VALUE NOTED.  VALUE IS CONSISTENT WITH PREVIOUSLY REPORTED AND CALLED VALUE.    Culture (A)  Final    ESCHERICHIA COLI SUSCEPTIBILITIES PERFORMED ON PREVIOUS CULTURE WITHIN THE LAST 5 DAYS. Performed at [facility], [address] [place], [place] [address]    Report Status 02/06/2019 FINAL  Final  Urine culture     Status: Abnormal   Collection Time: 02/02/19  8:36 PM   Specimen: In/Out Cath Urine  Result Value Ref Range Status   Specimen Description IN/OUT CATH URINE  Final   Special Requests   Final    NONE Performed at [facility], [address] [place], [place] [address]    Culture MULTIPLE SPECIES PRESENT, SUGGEST RECOLLECTION (A)  Final   Report Status 02/03/2019 FINAL  Final  SARS CORONAVIRUS 2 (TAT 6-24 HRS) Nasopharyngeal Nasopharyngeal Swab     Status: None   Collection Time: 02/02/19 10:03 PM   Specimen: Nasopharyngeal Swab  Result Value Ref Range Status  SARS Coronavirus 2 NEGATIVE NEGATIVE Final    Comment: (NOTE) SARS-CoV-2 target nucleic acids are NOT DETECTED. The SARS-CoV-2 RNA is generally detectable in upper and lower respiratory specimens during the acute phase of infection. Negative results do not preclude SARS-CoV-2 infection, do not rule out co-infections with other  pathogens, and should not be used as the sole basis for treatment or other patient management decisions. Negative results must be combined with clinical observations, patient history, and epidemiological information. The expected result is Negative. Fact Sheet for Patients: HairSlick.no Fact Sheet for Healthcare Providers: quierodirigir.com This test is not yet approved or cleared by the [place] FDA and  has been authorized for detection and/or diagnosis of SARS-CoV-2 by FDA under an Emergency Use Authorization (EUA). This EUA will remain  in effect (meaning this test can be used) for the duration of the COVID-19 declaration under Section 56 4(b)(1) of the Act, 21 U.S.C. section 360bbb-3(b)(1), unless the authorization is terminated or revoked sooner. Performed at [facility], [address] [place], [place] [address]   MRSA PCR Screening     Status: None   Collection Time: 02/03/19  5:28 AM   Specimen: Nasal Mucosa; Nasopharyngeal  Result Value Ref Range Status   MRSA by PCR NEGATIVE NEGATIVE Final    Comment:        The GeneXpert MRSA Assay (FDA approved for NASAL specimens only), is one component of a comprehensive MRSA colonization surveillance program. It is not intended to diagnose MRSA infection nor to guide or monitor treatment for MRSA infections. Performed at [facility], [address], [place], [place] [address]   Culture, blood (routine x 2)     Status: None (Preliminary result)   Collection Time: 02/04/19  7:40 AM   Specimen: BLOOD RIGHT HAND  Result Value Ref Range Status   Specimen Description BLOOD RIGHT HAND  Final   Special Requests   Final    BOTTLES DRAWN AEROBIC ONLY Blood Culture results may not be optimal due to an inadequate volume of blood received in culture bottles   Culture   Final    NO GROWTH 3 DAYS Performed at [facility], [address] [place], [place]  [address]    Report Status PENDING  Incomplete  Culture, blood (routine x 2)     Status: None (Preliminary result)   Collection Time: 02/04/19 10:17 AM   Specimen: BLOOD RIGHT HAND  Result Value Ref Range Status   Specimen Description BLOOD RIGHT HAND  Final   Special Requests   Final    BOTTLES DRAWN AEROBIC ONLY Blood Culture results may not be optimal due to an inadequate volume of blood received in culture bottles   Culture   Final    NO GROWTH 3 DAYS Performed at [facility], [address], [place], [place] [address]    Report Status PENDING  Incomplete     Patient was seen and examined on the day of discharge and was found to be in stable condition. Time coordinating discharge: 40 minutes including assessment and coordination of care, as well as examination of the patient.   SIGNED:  Noralee Stain, DO [facility] 02/08/2019, 11:04 AM

## 2019-02-08 NOTE — Consult Note (Signed)
[facility] CM Inpatient Consult   02/08/2019  Robin Snow 1941/03/23 [id number]    Patient was screened for potential needs ofTHNcare managementservices as a benefit from her [facility] plan with 13% risk score for unplanned readmission and hospitalizations.   Patient had been outreached by [facility] Health Coach in the past but multiple attempts to establish contact with patient with no success.   Review of chart and MD notes reveal as follows:   Robin Snow a 78 y.o.femalewithhistory of atrial fibrillation, hypertension, diabetes mellitus, chronic kidney disease stage III, had a fall at home when patient was trying to get out of the bed.    Seen by orthopedic surgery, no planned procedures. Will need to follow-up with her primary surgeon in [place].     Patient is being treated for sepsis secondary to E. coli bacteremia, A. fib with RVR.  Called and spoke with patientin the room as well as her daughter ([name]-"Nadine").HIPAA information obtained. Patient reportsthat she will bedischargeto home. Both daughter and patient reported having no issueswith medications (managed by daughter); pharmacy Public relations account executive at [employer]) and transportation (provided by dau). Patient states that her daughter is "in-charge of everything". Prior to admission, she and daughter live together. Discussed regarding THN CM serviceswhich areavailable to helpmaintain health/managehealth needs/ concerns andtoprevent readmissions.Patient and daughter denies any needs/ concerns with health issues for now. Daughter reports that patient is no longer taking any medications for DM with last A1c of 5.8, but still monitoring her blood sugar twice a week.  Her daughter states good understanding of managing patient's chronic health conditions and that patient is compliant with diet restrictions, medications and staying active.   Patient's daughter indicates that no [facility] services currently  needed, and had agreed to notify primary care provider if needs will arise. She is aware of need to follow-up with primary care provider post discharge.  Patient's daughter endorses that primary care provider is Dr.Elizabeth Crawford with [facility] [facility] providing transition of care follow-up.  Per Transition of care SW note, daughter opted for patient to discharge home with home health services instead of SNF-as recommended by PT. Arranged home health PT, OT, Nurse's Aide with [facility].  Of note, [facility] Care Management services does not replace or interfere with any services that are arranged by transition of care case management or social work.   Patient/ daughter made aware and agreed of EMMI General calls to follow-up recovery post discharge.   For questionsand additional information,please contact:  Anshul Meddings A. Chancy Smigiel, BSN, RN-BC [facility] Liaison Cell: [phone]

## 2019-02-09 ENCOUNTER — Telehealth: Payer: Self-pay | Admitting: *Deleted

## 2019-02-09 LAB — CULTURE, BLOOD (ROUTINE X 2)
Culture: NO GROWTH
Culture: NO GROWTH

## 2019-02-09 NOTE — Telephone Encounter (Signed)
Tried calling pt to make hosp f/u appt could not leave msg due to vm being full. Will retry later.Marland KitchenJohny Snow

## 2019-02-10 ENCOUNTER — Telehealth: Payer: Self-pay | Admitting: Internal Medicine

## 2019-02-10 DIAGNOSIS — I130 Hypertensive heart and chronic kidney disease with heart failure and stage 1 through stage 4 chronic kidney disease, or unspecified chronic kidney disease: Secondary | ICD-10-CM | POA: Diagnosis not present

## 2019-02-10 DIAGNOSIS — M1711 Unilateral primary osteoarthritis, right knee: Secondary | ICD-10-CM | POA: Diagnosis not present

## 2019-02-10 DIAGNOSIS — D591 Autoimmune hemolytic anemia, unspecified: Secondary | ICD-10-CM | POA: Diagnosis not present

## 2019-02-10 DIAGNOSIS — D696 Thrombocytopenia, unspecified: Secondary | ICD-10-CM | POA: Diagnosis not present

## 2019-02-10 DIAGNOSIS — I5032 Chronic diastolic (congestive) heart failure: Secondary | ICD-10-CM | POA: Diagnosis not present

## 2019-02-10 DIAGNOSIS — G8929 Other chronic pain: Secondary | ICD-10-CM | POA: Diagnosis not present

## 2019-02-10 DIAGNOSIS — A4151 Sepsis due to Escherichia coli [E. coli]: Secondary | ICD-10-CM | POA: Diagnosis not present

## 2019-02-10 DIAGNOSIS — G9340 Encephalopathy, unspecified: Secondary | ICD-10-CM | POA: Diagnosis not present

## 2019-02-10 DIAGNOSIS — I4891 Unspecified atrial fibrillation: Secondary | ICD-10-CM | POA: Diagnosis not present

## 2019-02-10 NOTE — Telephone Encounter (Signed)
Transition Care Management Follow-up Telephone Call  How have you been since you were released from the hospital? Daughter reports: She is doing ok at this time   Do you understand why you were in the hospital? yes   Do you understand the discharge instrcutions? yes  Items Reviewed:  Medications reviewed: yes  Allergies reviewed: no  Dietary changes reviewed: no  Referrals reviewed: yes   Functional Questionnaire:  Activities of Daily Living (ADLs):   She states they are independent in the following: needs some assistance States they require assistance with the following: ambulation, bathing and hygiene, feeding, continence, grooming, toileting and dressing , [name] has been in contact.  Any transportation issues/concerns?: no   Any patient concerns? Daughter reports she is concerned regarding patient's confusion but does not have any discharge instruction concerns and feels well supported at this time.   Confirmed importance and date/time of follow-up visits scheduled: yes, 02/14/19 @ 0940   Confirmed with patient if condition begins to worsen call PCP or go to the ER.  Patient was given the Call-a-Nurse line [phone]: yes

## 2019-02-10 NOTE — Telephone Encounter (Signed)
LVM with MD response  

## 2019-02-10 NOTE — Telephone Encounter (Signed)
For home health? Have not seen in 90 days so would need to schedule visit within 2 weeks of start date to supervise home health orders from the hospital. If she is going to SNF we cannot supervise these and it would need to be SNF provider.

## 2019-02-10 NOTE — Telephone Encounter (Signed)
Caller name: Dena  Relation to pt:RN  [facility]  Call back number: [phone]   Reason for call:  Patient was discharged from hospital and would like to know if PCP will be attending physician, please advise

## 2019-02-11 DIAGNOSIS — I5032 Chronic diastolic (congestive) heart failure: Secondary | ICD-10-CM | POA: Diagnosis not present

## 2019-02-11 DIAGNOSIS — D696 Thrombocytopenia, unspecified: Secondary | ICD-10-CM | POA: Diagnosis not present

## 2019-02-11 DIAGNOSIS — M1711 Unilateral primary osteoarthritis, right knee: Secondary | ICD-10-CM | POA: Diagnosis not present

## 2019-02-11 DIAGNOSIS — D591 Autoimmune hemolytic anemia, unspecified: Secondary | ICD-10-CM | POA: Diagnosis not present

## 2019-02-11 DIAGNOSIS — A4151 Sepsis due to Escherichia coli [E. coli]: Secondary | ICD-10-CM | POA: Diagnosis not present

## 2019-02-11 DIAGNOSIS — G9340 Encephalopathy, unspecified: Secondary | ICD-10-CM | POA: Diagnosis not present

## 2019-02-11 DIAGNOSIS — G8929 Other chronic pain: Secondary | ICD-10-CM | POA: Diagnosis not present

## 2019-02-11 DIAGNOSIS — I130 Hypertensive heart and chronic kidney disease with heart failure and stage 1 through stage 4 chronic kidney disease, or unspecified chronic kidney disease: Secondary | ICD-10-CM | POA: Diagnosis not present

## 2019-02-11 DIAGNOSIS — I4891 Unspecified atrial fibrillation: Secondary | ICD-10-CM | POA: Diagnosis not present

## 2019-02-13 DIAGNOSIS — G9340 Encephalopathy, unspecified: Secondary | ICD-10-CM | POA: Diagnosis not present

## 2019-02-13 DIAGNOSIS — M1711 Unilateral primary osteoarthritis, right knee: Secondary | ICD-10-CM | POA: Diagnosis not present

## 2019-02-13 DIAGNOSIS — G8929 Other chronic pain: Secondary | ICD-10-CM | POA: Diagnosis not present

## 2019-02-13 DIAGNOSIS — I130 Hypertensive heart and chronic kidney disease with heart failure and stage 1 through stage 4 chronic kidney disease, or unspecified chronic kidney disease: Secondary | ICD-10-CM | POA: Diagnosis not present

## 2019-02-13 DIAGNOSIS — I4891 Unspecified atrial fibrillation: Secondary | ICD-10-CM | POA: Diagnosis not present

## 2019-02-13 DIAGNOSIS — A4151 Sepsis due to Escherichia coli [E. coli]: Secondary | ICD-10-CM | POA: Diagnosis not present

## 2019-02-13 DIAGNOSIS — D696 Thrombocytopenia, unspecified: Secondary | ICD-10-CM | POA: Diagnosis not present

## 2019-02-13 DIAGNOSIS — D591 Autoimmune hemolytic anemia, unspecified: Secondary | ICD-10-CM | POA: Diagnosis not present

## 2019-02-13 DIAGNOSIS — I5032 Chronic diastolic (congestive) heart failure: Secondary | ICD-10-CM | POA: Diagnosis not present

## 2019-02-14 ENCOUNTER — Inpatient Hospital Stay: Payer: Self-pay | Admitting: Internal Medicine

## 2019-02-14 ENCOUNTER — Telehealth: Payer: Self-pay

## 2019-02-14 DIAGNOSIS — I130 Hypertensive heart and chronic kidney disease with heart failure and stage 1 through stage 4 chronic kidney disease, or unspecified chronic kidney disease: Secondary | ICD-10-CM | POA: Diagnosis not present

## 2019-02-14 DIAGNOSIS — I5032 Chronic diastolic (congestive) heart failure: Secondary | ICD-10-CM | POA: Diagnosis not present

## 2019-02-14 DIAGNOSIS — M1711 Unilateral primary osteoarthritis, right knee: Secondary | ICD-10-CM | POA: Diagnosis not present

## 2019-02-14 DIAGNOSIS — D696 Thrombocytopenia, unspecified: Secondary | ICD-10-CM | POA: Diagnosis not present

## 2019-02-14 DIAGNOSIS — I4891 Unspecified atrial fibrillation: Secondary | ICD-10-CM | POA: Diagnosis not present

## 2019-02-14 DIAGNOSIS — A4151 Sepsis due to Escherichia coli [E. coli]: Secondary | ICD-10-CM | POA: Diagnosis not present

## 2019-02-14 DIAGNOSIS — D591 Autoimmune hemolytic anemia, unspecified: Secondary | ICD-10-CM | POA: Diagnosis not present

## 2019-02-14 DIAGNOSIS — G9340 Encephalopathy, unspecified: Secondary | ICD-10-CM | POA: Diagnosis not present

## 2019-02-14 DIAGNOSIS — G8929 Other chronic pain: Secondary | ICD-10-CM | POA: Diagnosis not present

## 2019-02-14 NOTE — Telephone Encounter (Signed)
Would recommend urgent evaluation with her orthopedic doctor so they can assess for infection. Patient was supposed to come in today and can reschedule that visit.

## 2019-02-14 NOTE — Telephone Encounter (Signed)
Tried calling both numbers again and no answer will route to [facility] incase patient or daughter calls back in regard

## 2019-02-14 NOTE — Telephone Encounter (Signed)
They have seen the surgeon more recently as she had revision of that knee in 2019. She will need to call that office in [place] and see that surgeon. Per direct note from orthopedics who saw her in the hospital below.  "78 year old female with a history of revision right total knee arthroplasty was performed in [place] in 2019.  Has had chronic problems with that leg including inability to bear weight or bend the knee for the last few months.  Is been warm and swollen according to the daughter for multiple months as well.  I do not feel that clinically her knee as the cause of her sepsis with the history from her daughter.  Continue to treat her for UTI.  It appears that her x-rays show chronic changes that may be consistent with a chronic infection in that knee.  Unfortunately she will likely need to follow-up with her primary surgeon in [place] even though the daughter does not want to travel that far." Per orthopedics.

## 2019-02-14 NOTE — Telephone Encounter (Signed)
fyi

## 2019-02-14 NOTE — Telephone Encounter (Signed)
Tried calling patient voice mailbox full, tried calling daughter no answer voice mail full. called the home health nurse back and informed her of MD response. Robin Snow states that the problem is that her old orthopedics doctor is in [place] and she has not seen him in like 5 years that she would need a new referral to get in with a orthopedics doctor around here.

## 2019-02-14 NOTE — Telephone Encounter (Signed)
Copied from [place] [id number]. Topic: General - Inquiry >> Feb 14, 2019 12:41 PM Richardo Priest, [place] wrote: Reason for CRM: Inez Catalina, RN, called in stating she saw patient today and wanted to inform PCP that patient's right knee is swollen now and warm to the touch. She would also like to report there are blisters on the right inner thigh of patient. Please advise. Call back if needed is [phone].

## 2019-02-15 DIAGNOSIS — A4151 Sepsis due to Escherichia coli [E. coli]: Secondary | ICD-10-CM | POA: Diagnosis not present

## 2019-02-15 DIAGNOSIS — I4891 Unspecified atrial fibrillation: Secondary | ICD-10-CM | POA: Diagnosis not present

## 2019-02-15 DIAGNOSIS — I5032 Chronic diastolic (congestive) heart failure: Secondary | ICD-10-CM | POA: Diagnosis not present

## 2019-02-15 DIAGNOSIS — G8929 Other chronic pain: Secondary | ICD-10-CM | POA: Diagnosis not present

## 2019-02-15 DIAGNOSIS — D591 Autoimmune hemolytic anemia, unspecified: Secondary | ICD-10-CM | POA: Diagnosis not present

## 2019-02-15 DIAGNOSIS — I130 Hypertensive heart and chronic kidney disease with heart failure and stage 1 through stage 4 chronic kidney disease, or unspecified chronic kidney disease: Secondary | ICD-10-CM | POA: Diagnosis not present

## 2019-02-15 DIAGNOSIS — G9340 Encephalopathy, unspecified: Secondary | ICD-10-CM | POA: Diagnosis not present

## 2019-02-15 DIAGNOSIS — M1711 Unilateral primary osteoarthritis, right knee: Secondary | ICD-10-CM | POA: Diagnosis not present

## 2019-02-15 DIAGNOSIS — D696 Thrombocytopenia, unspecified: Secondary | ICD-10-CM | POA: Diagnosis not present

## 2019-02-17 DIAGNOSIS — D591 Autoimmune hemolytic anemia, unspecified: Secondary | ICD-10-CM | POA: Diagnosis not present

## 2019-02-17 DIAGNOSIS — D696 Thrombocytopenia, unspecified: Secondary | ICD-10-CM | POA: Diagnosis not present

## 2019-02-17 DIAGNOSIS — I4891 Unspecified atrial fibrillation: Secondary | ICD-10-CM | POA: Diagnosis not present

## 2019-02-17 DIAGNOSIS — G9340 Encephalopathy, unspecified: Secondary | ICD-10-CM | POA: Diagnosis not present

## 2019-02-17 DIAGNOSIS — G8929 Other chronic pain: Secondary | ICD-10-CM | POA: Diagnosis not present

## 2019-02-17 DIAGNOSIS — I5032 Chronic diastolic (congestive) heart failure: Secondary | ICD-10-CM | POA: Diagnosis not present

## 2019-02-17 DIAGNOSIS — I130 Hypertensive heart and chronic kidney disease with heart failure and stage 1 through stage 4 chronic kidney disease, or unspecified chronic kidney disease: Secondary | ICD-10-CM | POA: Diagnosis not present

## 2019-02-17 DIAGNOSIS — A4151 Sepsis due to Escherichia coli [E. coli]: Secondary | ICD-10-CM | POA: Diagnosis not present

## 2019-02-17 DIAGNOSIS — M1711 Unilateral primary osteoarthritis, right knee: Secondary | ICD-10-CM | POA: Diagnosis not present

## 2019-02-17 NOTE — Telephone Encounter (Signed)
[facility] Peeling PT called to report that pt's right knee is still hot to the touch. Pt has blisters on both thighs, inner and outer. Has a low grade fever. 101.2 (highest) 99.8 (lowest) [phone]

## 2019-02-17 NOTE — Telephone Encounter (Signed)
This needs to be treated immediately:  I have called Claiborne Billings with [name], pt home number, emergency contact Saralyn Pilar Till/pt son) and no answer on any number.  I left a vm on son mobile number, pt number x2 -line went straight to vm and message that vm box was full, lvm with Claiborne Billings as well to call back as soon as possible. Called pt dtr Delana Meyer) and was able to speak with her. Informed that I have tried to call Lissa Hoard and pt with no luck. Informed that pt needs to seek evaluation and possible treatment at the ED or Urgent Care as soon as possible.

## 2019-02-17 NOTE — Telephone Encounter (Signed)
Please advise 

## 2019-02-22 ENCOUNTER — Inpatient Hospital Stay (HOSPITAL_COMMUNITY)
Admission: EM | Admit: 2019-02-22 | Discharge: 2019-03-29 | DRG: 474 | Disposition: A | Discharge status: Home / Self Care | Payer: Medicare HMO | Attending: Internal Medicine | Admitting: Internal Medicine

## 2019-02-22 ENCOUNTER — Observation Stay (HOSPITAL_COMMUNITY): Payer: Medicare HMO

## 2019-02-22 ENCOUNTER — Other Ambulatory Visit: Payer: Self-pay

## 2019-02-22 ENCOUNTER — Observation Stay (HOSPITAL_COMMUNITY): Payer: Medicare HMO | Admitting: Anesthesiology

## 2019-02-22 ENCOUNTER — Encounter (HOSPITAL_COMMUNITY): Payer: Self-pay

## 2019-02-22 ENCOUNTER — Encounter (HOSPITAL_COMMUNITY)
Admission: EM | Disposition: A | Discharge status: Home / Self Care | Payer: Self-pay | Source: Home / Self Care | Attending: Pulmonary Disease

## 2019-02-22 ENCOUNTER — Emergency Department (HOSPITAL_COMMUNITY): Payer: Medicare HMO

## 2019-02-22 DIAGNOSIS — E1169 Type 2 diabetes mellitus with other specified complication: Secondary | ICD-10-CM | POA: Diagnosis present

## 2019-02-22 DIAGNOSIS — A419 Sepsis, unspecified organism: Secondary | ICD-10-CM | POA: Diagnosis not present

## 2019-02-22 DIAGNOSIS — T84022A Instability of internal right knee prosthesis, initial encounter: Secondary | ICD-10-CM | POA: Diagnosis present

## 2019-02-22 DIAGNOSIS — T8130XA Disruption of wound, unspecified, initial encounter: Secondary | ICD-10-CM | POA: Diagnosis not present

## 2019-02-22 DIAGNOSIS — Z452 Encounter for adjustment and management of vascular access device: Secondary | ICD-10-CM | POA: Diagnosis not present

## 2019-02-22 DIAGNOSIS — I130 Hypertensive heart and chronic kidney disease with heart failure and stage 1 through stage 4 chronic kidney disease, or unspecified chronic kidney disease: Secondary | ICD-10-CM | POA: Diagnosis present

## 2019-02-22 DIAGNOSIS — Z0189 Encounter for other specified special examinations: Secondary | ICD-10-CM | POA: Diagnosis not present

## 2019-02-22 DIAGNOSIS — I495 Sick sinus syndrome: Secondary | ICD-10-CM | POA: Diagnosis present

## 2019-02-22 DIAGNOSIS — T8453XA Infection and inflammatory reaction due to internal right knee prosthesis, initial encounter: Principal | ICD-10-CM | POA: Diagnosis present

## 2019-02-22 DIAGNOSIS — Z7901 Long term (current) use of anticoagulants: Secondary | ICD-10-CM | POA: Diagnosis not present

## 2019-02-22 DIAGNOSIS — I480 Paroxysmal atrial fibrillation: Secondary | ICD-10-CM | POA: Diagnosis present

## 2019-02-22 DIAGNOSIS — Y92239 Unspecified place in hospital as the place of occurrence of the external cause: Secondary | ICD-10-CM | POA: Diagnosis not present

## 2019-02-22 DIAGNOSIS — D62 Acute posthemorrhagic anemia: Secondary | ICD-10-CM | POA: Diagnosis not present

## 2019-02-22 DIAGNOSIS — I482 Chronic atrial fibrillation, unspecified: Secondary | ICD-10-CM | POA: Diagnosis present

## 2019-02-22 DIAGNOSIS — I69351 Hemiplegia and hemiparesis following cerebral infarction affecting right dominant side: Secondary | ICD-10-CM | POA: Diagnosis not present

## 2019-02-22 DIAGNOSIS — J9690 Respiratory failure, unspecified, unspecified whether with hypoxia or hypercapnia: Secondary | ICD-10-CM | POA: Diagnosis not present

## 2019-02-22 DIAGNOSIS — L02415 Cutaneous abscess of right lower limb: Secondary | ICD-10-CM | POA: Diagnosis present

## 2019-02-22 DIAGNOSIS — Z419 Encounter for procedure for purposes other than remedying health state, unspecified: Secondary | ICD-10-CM

## 2019-02-22 DIAGNOSIS — Y792 Prosthetic and other implants, materials and accessory orthopedic devices associated with adverse incidents: Secondary | ICD-10-CM | POA: Diagnosis present

## 2019-02-22 DIAGNOSIS — Z885 Allergy status to narcotic agent status: Secondary | ICD-10-CM

## 2019-02-22 DIAGNOSIS — Z888 Allergy status to other drugs, medicaments and biological substances status: Secondary | ICD-10-CM

## 2019-02-22 DIAGNOSIS — E119 Type 2 diabetes mellitus without complications: Secondary | ICD-10-CM | POA: Diagnosis not present

## 2019-02-22 DIAGNOSIS — E43 Unspecified severe protein-calorie malnutrition: Secondary | ICD-10-CM | POA: Diagnosis present

## 2019-02-22 DIAGNOSIS — M00861 Arthritis due to other bacteria, right knee: Secondary | ICD-10-CM | POA: Diagnosis not present

## 2019-02-22 DIAGNOSIS — A4151 Sepsis due to Escherichia coli [E. coli]: Secondary | ICD-10-CM | POA: Diagnosis present

## 2019-02-22 DIAGNOSIS — M25461 Effusion, right knee: Secondary | ICD-10-CM

## 2019-02-22 DIAGNOSIS — I340 Nonrheumatic mitral (valve) insufficiency: Secondary | ICD-10-CM | POA: Diagnosis not present

## 2019-02-22 DIAGNOSIS — J90 Pleural effusion, not elsewhere classified: Secondary | ICD-10-CM | POA: Diagnosis not present

## 2019-02-22 DIAGNOSIS — Z1611 Resistance to penicillins: Secondary | ICD-10-CM | POA: Diagnosis present

## 2019-02-22 DIAGNOSIS — Y831 Surgical operation with implant of artificial internal device as the cause of abnormal reaction of the patient, or of later complication, without mention of misadventure at the time of the procedure: Secondary | ICD-10-CM | POA: Diagnosis not present

## 2019-02-22 DIAGNOSIS — R52 Pain, unspecified: Secondary | ICD-10-CM | POA: Diagnosis not present

## 2019-02-22 DIAGNOSIS — I959 Hypotension, unspecified: Secondary | ICD-10-CM | POA: Diagnosis not present

## 2019-02-22 DIAGNOSIS — E785 Hyperlipidemia, unspecified: Secondary | ICD-10-CM | POA: Diagnosis present

## 2019-02-22 DIAGNOSIS — S83104D Unspecified dislocation of right knee, subsequent encounter: Secondary | ICD-10-CM | POA: Diagnosis not present

## 2019-02-22 DIAGNOSIS — J9600 Acute respiratory failure, unspecified whether with hypoxia or hypercapnia: Secondary | ICD-10-CM | POA: Diagnosis not present

## 2019-02-22 DIAGNOSIS — D649 Anemia, unspecified: Secondary | ICD-10-CM | POA: Diagnosis present

## 2019-02-22 DIAGNOSIS — N1831 Chronic kidney disease, stage 3a: Secondary | ICD-10-CM | POA: Diagnosis present

## 2019-02-22 DIAGNOSIS — L03115 Cellulitis of right lower limb: Secondary | ICD-10-CM | POA: Diagnosis not present

## 2019-02-22 DIAGNOSIS — R791 Abnormal coagulation profile: Secondary | ICD-10-CM | POA: Diagnosis present

## 2019-02-22 DIAGNOSIS — Z79899 Other long term (current) drug therapy: Secondary | ICD-10-CM

## 2019-02-22 DIAGNOSIS — T8781 Dehiscence of amputation stump: Secondary | ICD-10-CM | POA: Diagnosis not present

## 2019-02-22 DIAGNOSIS — S83104A Unspecified dislocation of right knee, initial encounter: Secondary | ICD-10-CM | POA: Diagnosis not present

## 2019-02-22 DIAGNOSIS — Z03818 Encounter for observation for suspected exposure to other biological agents ruled out: Secondary | ICD-10-CM | POA: Diagnosis not present

## 2019-02-22 DIAGNOSIS — N183 Chronic kidney disease, stage 3 unspecified: Secondary | ICD-10-CM | POA: Diagnosis not present

## 2019-02-22 DIAGNOSIS — G4733 Obstructive sleep apnea (adult) (pediatric): Secondary | ICD-10-CM | POA: Diagnosis present

## 2019-02-22 DIAGNOSIS — Z950 Presence of cardiac pacemaker: Secondary | ICD-10-CM | POA: Diagnosis not present

## 2019-02-22 DIAGNOSIS — R159 Full incontinence of feces: Secondary | ICD-10-CM | POA: Diagnosis not present

## 2019-02-22 DIAGNOSIS — R571 Hypovolemic shock: Secondary | ICD-10-CM | POA: Diagnosis not present

## 2019-02-22 DIAGNOSIS — Z6835 Body mass index (BMI) 35.0-35.9, adult: Secondary | ICD-10-CM

## 2019-02-22 DIAGNOSIS — L02419 Cutaneous abscess of limb, unspecified: Secondary | ICD-10-CM

## 2019-02-22 DIAGNOSIS — K5903 Drug induced constipation: Secondary | ICD-10-CM | POA: Diagnosis not present

## 2019-02-22 DIAGNOSIS — E861 Hypovolemia: Secondary | ICD-10-CM | POA: Diagnosis not present

## 2019-02-22 DIAGNOSIS — R7881 Bacteremia: Secondary | ICD-10-CM | POA: Diagnosis not present

## 2019-02-22 DIAGNOSIS — M869 Osteomyelitis, unspecified: Secondary | ICD-10-CM

## 2019-02-22 DIAGNOSIS — E118 Type 2 diabetes mellitus with unspecified complications: Secondary | ICD-10-CM

## 2019-02-22 DIAGNOSIS — S82142A Displaced bicondylar fracture of left tibia, initial encounter for closed fracture: Secondary | ICD-10-CM

## 2019-02-22 DIAGNOSIS — Z9911 Dependence on respirator [ventilator] status: Secondary | ICD-10-CM | POA: Diagnosis not present

## 2019-02-22 DIAGNOSIS — S83004A Unspecified dislocation of right patella, initial encounter: Secondary | ICD-10-CM | POA: Diagnosis not present

## 2019-02-22 DIAGNOSIS — E1152 Type 2 diabetes mellitus with diabetic peripheral angiopathy with gangrene: Secondary | ICD-10-CM | POA: Diagnosis present

## 2019-02-22 DIAGNOSIS — I471 Supraventricular tachycardia: Secondary | ICD-10-CM | POA: Diagnosis not present

## 2019-02-22 DIAGNOSIS — I330 Acute and subacute infective endocarditis: Secondary | ICD-10-CM | POA: Diagnosis not present

## 2019-02-22 DIAGNOSIS — M86451 Chronic osteomyelitis with draining sinus, right femur: Secondary | ICD-10-CM | POA: Diagnosis present

## 2019-02-22 DIAGNOSIS — E1122 Type 2 diabetes mellitus with diabetic chronic kidney disease: Secondary | ICD-10-CM | POA: Diagnosis present

## 2019-02-22 DIAGNOSIS — T40605A Adverse effect of unspecified narcotics, initial encounter: Secondary | ICD-10-CM | POA: Diagnosis not present

## 2019-02-22 DIAGNOSIS — Z886 Allergy status to analgesic agent status: Secondary | ICD-10-CM

## 2019-02-22 DIAGNOSIS — L89302 Pressure ulcer of unspecified buttock, stage 2: Secondary | ICD-10-CM | POA: Diagnosis not present

## 2019-02-22 DIAGNOSIS — I455 Other specified heart block: Secondary | ICD-10-CM | POA: Diagnosis present

## 2019-02-22 DIAGNOSIS — Z89611 Acquired absence of right leg above knee: Secondary | ICD-10-CM | POA: Diagnosis not present

## 2019-02-22 DIAGNOSIS — I96 Gangrene, not elsewhere classified: Secondary | ICD-10-CM | POA: Diagnosis not present

## 2019-02-22 DIAGNOSIS — Z978 Presence of other specified devices: Secondary | ICD-10-CM | POA: Diagnosis not present

## 2019-02-22 DIAGNOSIS — Z20828 Contact with and (suspected) exposure to other viral communicable diseases: Secondary | ICD-10-CM | POA: Diagnosis present

## 2019-02-22 DIAGNOSIS — R531 Weakness: Secondary | ICD-10-CM | POA: Diagnosis not present

## 2019-02-22 DIAGNOSIS — I872 Venous insufficiency (chronic) (peripheral): Secondary | ICD-10-CM | POA: Diagnosis present

## 2019-02-22 DIAGNOSIS — B9620 Unspecified Escherichia coli [E. coli] as the cause of diseases classified elsewhere: Secondary | ICD-10-CM | POA: Diagnosis not present

## 2019-02-22 DIAGNOSIS — J95821 Acute postprocedural respiratory failure: Secondary | ICD-10-CM | POA: Diagnosis not present

## 2019-02-22 DIAGNOSIS — E46 Unspecified protein-calorie malnutrition: Secondary | ICD-10-CM | POA: Diagnosis not present

## 2019-02-22 DIAGNOSIS — Z95828 Presence of other vascular implants and grafts: Secondary | ICD-10-CM

## 2019-02-22 DIAGNOSIS — L89892 Pressure ulcer of other site, stage 2: Secondary | ICD-10-CM | POA: Diagnosis not present

## 2019-02-22 DIAGNOSIS — I5032 Chronic diastolic (congestive) heart failure: Secondary | ICD-10-CM | POA: Diagnosis present

## 2019-02-22 DIAGNOSIS — D500 Iron deficiency anemia secondary to blood loss (chronic): Secondary | ICD-10-CM | POA: Diagnosis present

## 2019-02-22 DIAGNOSIS — Z823 Family history of stroke: Secondary | ICD-10-CM

## 2019-02-22 DIAGNOSIS — Z9889 Other specified postprocedural states: Secondary | ICD-10-CM | POA: Diagnosis not present

## 2019-02-22 DIAGNOSIS — Z96641 Presence of right artificial hip joint: Secondary | ICD-10-CM | POA: Diagnosis not present

## 2019-02-22 DIAGNOSIS — R918 Other nonspecific abnormal finding of lung field: Secondary | ICD-10-CM | POA: Diagnosis not present

## 2019-02-22 DIAGNOSIS — I10 Essential (primary) hypertension: Secondary | ICD-10-CM | POA: Diagnosis not present

## 2019-02-22 DIAGNOSIS — K219 Gastro-esophageal reflux disease without esophagitis: Secondary | ICD-10-CM | POA: Diagnosis present

## 2019-02-22 DIAGNOSIS — T8451XA Infection and inflammatory reaction due to internal right hip prosthesis, initial encounter: Secondary | ICD-10-CM | POA: Diagnosis not present

## 2019-02-22 DIAGNOSIS — M879 Osteonecrosis, unspecified: Secondary | ICD-10-CM | POA: Diagnosis not present

## 2019-02-22 DIAGNOSIS — Z781 Physical restraint status: Secondary | ICD-10-CM

## 2019-02-22 DIAGNOSIS — Z87891 Personal history of nicotine dependence: Secondary | ICD-10-CM

## 2019-02-22 DIAGNOSIS — I4891 Unspecified atrial fibrillation: Secondary | ICD-10-CM | POA: Diagnosis not present

## 2019-02-22 DIAGNOSIS — Z7952 Long term (current) use of systemic steroids: Secondary | ICD-10-CM

## 2019-02-22 DIAGNOSIS — M25561 Pain in right knee: Secondary | ICD-10-CM | POA: Diagnosis not present

## 2019-02-22 DIAGNOSIS — K567 Ileus, unspecified: Secondary | ICD-10-CM | POA: Diagnosis not present

## 2019-02-22 DIAGNOSIS — E876 Hypokalemia: Secondary | ICD-10-CM | POA: Diagnosis not present

## 2019-02-22 DIAGNOSIS — J9601 Acute respiratory failure with hypoxia: Secondary | ICD-10-CM | POA: Diagnosis not present

## 2019-02-22 DIAGNOSIS — Z96653 Presence of artificial knee joint, bilateral: Secondary | ICD-10-CM | POA: Diagnosis present

## 2019-02-22 DIAGNOSIS — M6281 Muscle weakness (generalized): Secondary | ICD-10-CM | POA: Diagnosis not present

## 2019-02-22 DIAGNOSIS — L8990 Pressure ulcer of unspecified site, unspecified stage: Secondary | ICD-10-CM | POA: Insufficient documentation

## 2019-02-22 DIAGNOSIS — Z4682 Encounter for fitting and adjustment of non-vascular catheter: Secondary | ICD-10-CM | POA: Diagnosis not present

## 2019-02-22 HISTORY — PX: KNEE CLOSED REDUCTION: SHX995

## 2019-02-22 LAB — LACTATE DEHYDROGENASE: LDH: 204 U/L — ABNORMAL HIGH (ref 98–192)

## 2019-02-22 LAB — CBC WITH DIFFERENTIAL/PLATELET
Abs Immature Granulocytes: 0.56 10*3/uL — ABNORMAL HIGH (ref 0.00–0.07)
Basophils Absolute: 0 10*3/uL (ref 0.0–0.1)
Basophils Relative: 0 %
Eosinophils Absolute: 0 10*3/uL (ref 0.0–0.5)
Eosinophils Relative: 0 %
HCT: 25.6 % — ABNORMAL LOW (ref 36.0–46.0)
Hemoglobin: 8.1 g/dL — ABNORMAL LOW (ref 12.0–15.0)
Immature Granulocytes: 4 %
Lymphocytes Relative: 16 %
Lymphs Abs: 2.4 10*3/uL (ref 0.7–4.0)
MCH: 27.5 pg (ref 26.0–34.0)
MCHC: 31.6 g/dL (ref 30.0–36.0)
MCV: 86.8 fL (ref 80.0–100.0)
Monocytes Absolute: 1.6 10*3/uL — ABNORMAL HIGH (ref 0.1–1.0)
Monocytes Relative: 11 %
Neutro Abs: 10.3 10*3/uL — ABNORMAL HIGH (ref 1.7–7.7)
Neutrophils Relative %: 69 %
Platelets: 364 10*3/uL (ref 150–400)
RBC: 2.95 MIL/uL — ABNORMAL LOW (ref 3.87–5.11)
RDW: 17.2 % — ABNORMAL HIGH (ref 11.5–15.5)
WBC: 14.9 10*3/uL — ABNORMAL HIGH (ref 4.0–10.5)
nRBC: 0.9 % — ABNORMAL HIGH (ref 0.0–0.2)

## 2019-02-22 LAB — BASIC METABOLIC PANEL
Anion gap: 14 (ref 5–15)
BUN: 9 mg/dL (ref 8–23)
CO2: 22 mmol/L (ref 22–32)
Calcium: 8.2 mg/dL — ABNORMAL LOW (ref 8.9–10.3)
Chloride: 100 mmol/L (ref 98–111)
Creatinine, Ser: 1.09 mg/dL — ABNORMAL HIGH (ref 0.44–1.00)
GFR calc Af Amer: 56 mL/min — ABNORMAL LOW (ref >60)
GFR calc non Af Amer: 48 mL/min — ABNORMAL LOW (ref >60)
Glucose, Bld: 135 mg/dL — ABNORMAL HIGH (ref 70–99)
Potassium: 3.9 mmol/L (ref 3.5–5.1)
Sodium: 136 mmol/L (ref 135–145)

## 2019-02-22 LAB — GLUCOSE, CAPILLARY: Glucose-Capillary: 135 mg/dL — ABNORMAL HIGH (ref 70–99)

## 2019-02-22 LAB — RETICULOCYTES
Immature Retic Fract: 41 % — ABNORMAL HIGH (ref 2.3–15.9)
RBC.: 2.51 MIL/uL — ABNORMAL LOW (ref 3.87–5.11)
Retic Count, Absolute: 69.5 10*3/uL (ref 19.0–186.0)
Retic Ct Pct: 2.8 % (ref 0.4–3.1)

## 2019-02-22 LAB — PREPARE RBC (CROSSMATCH)

## 2019-02-22 LAB — LACTIC ACID, PLASMA
Lactic Acid, Venous: 2.9 mmol/L (ref 0.5–1.9)
Lactic Acid, Venous: 3 mmol/L (ref 0.5–1.9)

## 2019-02-22 LAB — PROTIME-INR
INR: 7.3 (ref 0.8–1.2)
Prothrombin Time: 62.9 seconds — ABNORMAL HIGH (ref 11.4–15.2)

## 2019-02-22 LAB — SARS CORONAVIRUS 2 BY RT PCR (HOSPITAL ORDER, PERFORMED IN ~~LOC~~ HOSPITAL LAB): SARS Coronavirus 2: NEGATIVE

## 2019-02-22 LAB — HEMOGLOBIN AND HEMATOCRIT, BLOOD
HCT: 22.1 % — ABNORMAL LOW (ref 36.0–46.0)
Hemoglobin: 7.1 g/dL — ABNORMAL LOW (ref 12.0–15.0)

## 2019-02-22 LAB — SAVE SMEAR(SSMR), FOR PROVIDER SLIDE REVIEW

## 2019-02-22 SURGERY — MANIPULATION, KNEE, CLOSED
Anesthesia: General | Site: Knee | Laterality: Right

## 2019-02-22 MED ORDER — EPHEDRINE 5 MG/ML INJ
INTRAVENOUS | Status: AC
  Filled 2019-02-22: qty 10

## 2019-02-22 MED ORDER — METOPROLOL TARTRATE 25 MG PO TABS
25.0000 mg | ORAL_TABLET | Freq: Two times a day (BID) | ORAL | Status: DC
  Administered 2019-02-22 – 2019-03-01 (×15): 25 mg via ORAL
  Filled 2019-02-22 (×14): qty 1

## 2019-02-22 MED ORDER — ONDANSETRON HCL 4 MG PO TABS: 4.0000 mg | ORAL_TABLET | Freq: Four times a day (QID) | ORAL | Status: DC | PRN

## 2019-02-22 MED ORDER — ACETAMINOPHEN 10 MG/ML IV SOLN
INTRAVENOUS | Status: AC
  Filled 2019-02-22: qty 100

## 2019-02-22 MED ORDER — LIDOCAINE HCL (CARDIAC) PF 100 MG/5ML IV SOSY
PREFILLED_SYRINGE | INTRAVENOUS | Status: DC | PRN
  Administered 2019-02-22: 40 mg via INTRATRACHEAL

## 2019-02-22 MED ORDER — VITAMIN K1 10 MG/ML IJ SOLN: 5.0000 mg | INTRAVENOUS | Status: DC

## 2019-02-22 MED ORDER — VITAMIN K1 10 MG/ML IJ SOLN
5.0000 mg | INTRAVENOUS | Status: AC
  Administered 2019-02-22: 5 mg via INTRAVENOUS
  Filled 2019-02-22: qty 0.5

## 2019-02-22 MED ORDER — EPHEDRINE SULFATE 50 MG/ML IJ SOLN
INTRAMUSCULAR | Status: DC | PRN
  Administered 2019-02-22: 5 mg via INTRAVENOUS
  Administered 2019-02-22 (×2): 10 mg via INTRAVENOUS

## 2019-02-22 MED ORDER — ACETAMINOPHEN 10 MG/ML IV SOLN
INTRAVENOUS | Status: DC | PRN
  Administered 2019-02-22: 1000 mg via INTRAVENOUS

## 2019-02-22 MED ORDER — LIDOCAINE 2% (20 MG/ML) 5 ML SYRINGE
INTRAMUSCULAR | Status: AC
  Filled 2019-02-22: qty 10

## 2019-02-22 MED ORDER — LACTATED RINGERS IV SOLN
INTRAVENOUS | Status: DC | PRN
  Administered 2019-02-22: 14:00:00 via INTRAVENOUS

## 2019-02-22 MED ORDER — GABAPENTIN 300 MG PO CAPS
300.0000 mg | ORAL_CAPSULE | Freq: Three times a day (TID) | ORAL | Status: DC
  Administered 2019-02-22 – 2019-03-01 (×20): 300 mg via ORAL
  Filled 2019-02-22 (×21): qty 1

## 2019-02-22 MED ORDER — FENTANYL CITRATE (PF) 100 MCG/2ML IJ SOLN: 50.0000 ug | Freq: Once | INTRAMUSCULAR | Status: DC

## 2019-02-22 MED ORDER — ACETAMINOPHEN 650 MG RE SUPP: 650.0000 mg | Freq: Four times a day (QID) | RECTAL | Status: DC | PRN

## 2019-02-22 MED ORDER — PRAVASTATIN SODIUM 10 MG PO TABS
20.0000 mg | ORAL_TABLET | Freq: Every day | ORAL | Status: DC
  Administered 2019-02-22 – 2019-02-28 (×7): 20 mg via ORAL
  Filled 2019-02-22 (×8): qty 2

## 2019-02-22 MED ORDER — PROPOFOL 10 MG/ML IV BOLUS
INTRAVENOUS | Status: DC | PRN
  Administered 2019-02-22: 30 mg via INTRAVENOUS
  Administered 2019-02-22: 80 mg via INTRAVENOUS

## 2019-02-22 MED ORDER — FENTANYL CITRATE (PF) 100 MCG/2ML IJ SOLN: 25.0000 ug | INTRAMUSCULAR | Status: DC | PRN

## 2019-02-22 MED ORDER — FENTANYL CITRATE (PF) 100 MCG/2ML IJ SOLN
50.0000 ug | Freq: Once | INTRAMUSCULAR | Status: AC
  Administered 2019-02-22: 50 ug via INTRAVENOUS
  Filled 2019-02-22: qty 2

## 2019-02-22 MED ORDER — DILTIAZEM HCL ER COATED BEADS 120 MG PO CP24
120.0000 mg | ORAL_CAPSULE | Freq: Every day | ORAL | Status: DC
  Administered 2019-02-22 – 2019-03-01 (×8): 120 mg via ORAL
  Filled 2019-02-22 (×8): qty 1

## 2019-02-22 MED ORDER — MORPHINE SULFATE (PF) 2 MG/ML IV SOLN
INTRAVENOUS | Status: AC
  Filled 2019-02-22: qty 1

## 2019-02-22 MED ORDER — SODIUM CHLORIDE 0.9% IV SOLUTION: Freq: Once | INTRAVENOUS | Status: DC

## 2019-02-22 MED ORDER — MORPHINE SULFATE (PF) 4 MG/ML IV SOLN
4.0000 mg | Freq: Once | INTRAVENOUS | Status: AC
  Administered 2019-02-22: 4 mg via INTRAVENOUS
  Filled 2019-02-22: qty 1

## 2019-02-22 MED ORDER — SODIUM CHLORIDE 0.9 % IV BOLUS
500.0000 mL | Freq: Once | INTRAVENOUS | Status: AC
  Administered 2019-02-22: 500 mL via INTRAVENOUS

## 2019-02-22 MED ORDER — WARFARIN - PHARMACIST DOSING INPATIENT: Freq: Every day | Status: DC

## 2019-02-22 MED ORDER — ALBUTEROL SULFATE (2.5 MG/3ML) 0.083% IN NEBU: 2.5000 mg | INHALATION_SOLUTION | Freq: Four times a day (QID) | RESPIRATORY_TRACT | Status: DC | PRN

## 2019-02-22 MED ORDER — DILTIAZEM HCL 25 MG/5ML IV SOLN: 10.0000 mg | Freq: Once | INTRAVENOUS | Status: DC

## 2019-02-22 MED ORDER — ACETAMINOPHEN 325 MG PO TABS
650.0000 mg | ORAL_TABLET | Freq: Four times a day (QID) | ORAL | Status: DC | PRN
  Administered 2019-02-22 – 2019-03-04 (×8): 650 mg via ORAL
  Filled 2019-02-22 (×8): qty 2

## 2019-02-22 MED ORDER — PROPOFOL 10 MG/ML IV BOLUS
INTRAVENOUS | Status: AC
  Filled 2019-02-22: qty 40

## 2019-02-22 MED ORDER — PHENYLEPHRINE 40 MCG/ML (10ML) SYRINGE FOR IV PUSH (FOR BLOOD PRESSURE SUPPORT)
PREFILLED_SYRINGE | INTRAVENOUS | Status: AC
  Filled 2019-02-22: qty 10

## 2019-02-22 MED ORDER — SODIUM CHLORIDE 0.9 % IV SOLN
INTRAVENOUS | Status: DC | PRN
  Administered 2019-02-22 (×3): 80 ug via INTRAVENOUS

## 2019-02-22 MED ORDER — VITAMIN K1 10 MG/ML IJ SOLN: 5.0000 mg | Freq: Once | INTRAVENOUS | Status: DC

## 2019-02-22 MED ORDER — SODIUM CHLORIDE 0.9% FLUSH
3.0000 mL | Freq: Two times a day (BID) | INTRAVENOUS | Status: DC
  Administered 2019-02-22 – 2019-03-09 (×25): 3 mL via INTRAVENOUS
  Administered 2019-03-13: 09:00:00 via INTRAVENOUS
  Administered 2019-03-13 – 2019-03-28 (×9): 3 mL via INTRAVENOUS

## 2019-02-22 MED ORDER — ONDANSETRON HCL 4 MG/2ML IJ SOLN
INTRAMUSCULAR | Status: DC | PRN
  Administered 2019-02-22: 4 mg via INTRAVENOUS

## 2019-02-22 MED ORDER — MORPHINE SULFATE (PF) 2 MG/ML IV SOLN
2.0000 mg | INTRAVENOUS | Status: DC | PRN
  Administered 2019-02-22 – 2019-03-01 (×5): 2 mg via INTRAVENOUS
  Filled 2019-02-22 (×4): qty 1

## 2019-02-22 MED ORDER — ONDANSETRON HCL 4 MG/2ML IJ SOLN
INTRAMUSCULAR | Status: AC
  Filled 2019-02-22: qty 2

## 2019-02-22 MED ORDER — ONDANSETRON HCL 4 MG/2ML IJ SOLN
4.0000 mg | Freq: Four times a day (QID) | INTRAMUSCULAR | Status: DC | PRN
  Administered 2019-03-16: 4 mg via INTRAVENOUS
  Filled 2019-02-22: qty 2

## 2019-02-22 MED ORDER — VITAMIN K1 10 MG/ML IJ SOLN
2.5000 mg | Freq: Once | INTRAVENOUS | Status: DC
  Filled 2019-02-22: qty 0.25

## 2019-02-22 MED ORDER — FERROUS SULFATE 325 (65 FE) MG PO TABS
325.0000 mg | ORAL_TABLET | Freq: Two times a day (BID) | ORAL | Status: DC
  Administered 2019-02-22 – 2019-03-01 (×14): 325 mg via ORAL
  Filled 2019-02-22 (×16): qty 1

## 2019-02-22 SURGICAL SUPPLY — 4 items
BNDG ELASTIC 6X5.8 VLCR STR LF (GAUZE/BANDAGES/DRESSINGS) ×2 IMPLANT
GAUZE SPONGE 4X4 12PLY STRL (GAUZE/BANDAGES/DRESSINGS) ×2 IMPLANT
IMMOBILIZER KNEE 20 (SOFTGOODS) ×3
IMMOBILIZER KNEE 20 THIGH 36 (SOFTGOODS) IMPLANT

## 2019-02-22 NOTE — Progress Notes (Signed)
Multiple attempts made to remove dentures from patient's mouth.  Unsuccessful.  Dr. Doroteo Glassman notified.  She stated it was ok to leave them in.  Gold ring removed from patient's left hand.  Placed in baggie, attached to chart.

## 2019-02-22 NOTE — Consult Note (Signed)
Reason for Consult:Right knee dislocation Referring Physician: A Curatolo  Robin Snow is an 78 y.o. female.  HPI: [name] was seen in the ED about 2 weeks ago with ongoing right knee pain. At the time this was thought to be a chronic process, despite her acute sepsis, and she was directed to return to her operative surgeon in [place]. After discharge daughter reports that she was unable to bear any weight on that leg and has been non-ambulatory. During a transfer on Sunday the daughter heard a pop in the knee. She returns to the ED today with continued pain but denies fevers though there have been some documented fevers through home care. Daughter reports dissatisfaction with returning to operative surgeon as he just says everything is all right and doesn't do anything to treat her.  Past Medical History:  Diagnosis Date  . AKI (acute kidney injury) (HCC) 04/2016  . Anemia   . Arthritis   . Chronic atrial fibrillation (HCC)   . Chronic edema   . Chronic venous insufficiency   . CKD (chronic kidney disease), stage III   . Coagulopathy (HCC)   . Diabetes mellitus    Type 2  . Diverticulosis   . Dyslipidemia   . GERD (gastroesophageal reflux disease)   . GI bleed 2015   a. lower GI from diverticular source 2015.  [name] Hx of transfusion of packed red blood cells   . Hypertension   . Morbid obesity (HCC)   . Obstructive sleep apnea    on CPAP  . Pulmonary hypertension (HCC)   . Pyelonephritis 04/2016  . Renal infarct (HCC)    a. 04/2016- adm with flank pain, ? pyelo vs renal infarct in setting of recent subtherapeutic INR.  [name] Sinus bradycardia   . Stroke [facility]) 2015    Past Surgical History:  Procedure Laterality Date  . CESAREAN SECTION    . COLONOSCOPY Left 01/24/2013   Procedure: COLONOSCOPY;  Surgeon: Willis Modena, MD;  Location: [facility] ENDOSCOPY;  Service: Endoscopy;  Laterality: Left;  . ESOPHAGOGASTRODUODENOSCOPY Left 01/23/2013   Procedure: ESOPHAGOGASTRODUODENOSCOPY (EGD);   Surgeon: Willis Modena, MD;  Location: [facility] ENDOSCOPY;  Service: Endoscopy;  Laterality: Left;  . EYE SURGERY Bilateral    cataract surgery  . JOINT REPLACEMENT    . PACEMAKER IMPLANT [name] 09/09/2016   Procedure: Pacemaker Implant;  Surgeon: Marinus Maw, MD;  Location: [facility] INVASIVE CV LAB;  Service: Cardiovascular;  Laterality: [name];  . REPLACEMENT TOTAL KNEE BILATERAL    . TUBAL LIGATION      Family History  Problem Relation Age of Onset  . Cancer Father   . Stroke Maternal Aunt     Social History:  reports that she quit smoking about 25 years ago. Her smoking use included cigarettes. She has a 4.08 pack-year smoking history. She has never used smokeless tobacco. She reports that she does not drink alcohol or use drugs.  Allergies:  Allergies  Allergen Reactions  . Aspirin Hives and Swelling    Angioedema   . Rofecoxib Hives  . Hydrocodone Hives    Tolerates oxycodone and tramadol    Medications: I have reviewed the patient's current medications.  Results for orders placed or performed during the hospital encounter of 02/22/19 (from the past 48 hour(s))  CBC with Differential     Status: Abnormal   Collection Time: 02/22/19  8:14 AM  Result Value Ref Range   WBC 14.9 (H) 4.0 - 10.5 K/uL   RBC 2.95 (L) 3.87 -  5.11 MIL/uL   Hemoglobin 8.1 (L) 12.0 - 15.0 g/dL   HCT 16.125.6 (L) 09.636.0 - 04.546.0 %   MCV 86.8 80.0 - 100.0 fL   MCH 27.5 26.0 - 34.0 pg   MCHC 31.6 30.0 - 36.0 g/dL   RDW 40.917.2 (H) 81.111.5 - 91.415.5 %   Platelets 364 150 - 400 K/uL   nRBC 0.9 (H) 0.0 - 0.2 %   Neutrophils Relative % 69 %   Neutro Abs 10.3 (H) 1.7 - 7.7 K/uL   Lymphocytes Relative 16 %   Lymphs Abs 2.4 0.7 - 4.0 K/uL   Monocytes Relative 11 %   Monocytes Absolute 1.6 (H) 0.1 - 1.0 K/uL   Eosinophils Relative 0 %   Eosinophils Absolute 0.0 0.0 - 0.5 K/uL   Basophils Relative 0 %   Basophils Absolute 0.0 0.0 - 0.1 K/uL   Immature Granulocytes 4 %   Abs Immature Granulocytes 0.56 (H) 0.00 - 0.07 K/uL     Comment: Performed at [facility], [address] [place], [place] [address]  Basic metabolic panel     Status: Abnormal   Collection Time: 02/22/19  8:14 AM  Result Value Ref Range   Sodium 136 135 - 145 mmol/L   Potassium 3.9 3.5 - 5.1 mmol/L   Chloride 100 98 - 111 mmol/L   CO2 22 22 - 32 mmol/L   Glucose, Bld 135 (H) 70 - 99 mg/dL   BUN 9 8 - 23 mg/dL   Creatinine, Ser 6.211.09 (H) 0.44 - 1.00 mg/dL   Calcium 8.2 (L) 8.9 - 10.3 mg/dL   GFR calc non Af Amer 48 (L) >60 mL/min   GFR calc Af Amer 56 (L) >60 mL/min   Anion gap 14 5 - 15    Comment: Performed at [facility], [address] [place], [place] [address]    Dg Knee Complete 4 Views Right  Result Date: 02/22/2019 CLINICAL DATA:  Increasing knee pain, initial encounter EXAM: RIGHT KNEE - COMPLETE 4+ VIEW COMPARISON:  02/02/2019 FINDINGS: The tibial component of the knee prosthesis has been dislocated anteriorly with respect to the femoral component. Considerable joint effusion is noted new from the prior exam. Lucency is noted along the lateral aspect of the tibial component somewhat suspicious for loosening. This is stable from the prior exam. Significant remodeling of the patella is noted. IMPRESSION: New large joint effusion. Anterior dislocation of the femoral component with respect to the tibial component. Stable lucency is noted in the periprosthetic regions suspicious for loosening. Critical Value/emergent results were called by telephone at the time of interpretation on 02/22/2019 at 8:10 am to [facility] MORELLI, PA , who verbally acknowledged these results. Electronically Signed   By: Alcide CleverMark  Lukens M.D.   On: 02/22/2019 08:11    Review of Systems  Constitutional: Negative for chills, fever and weight loss.  HENT: Negative for ear discharge, ear pain, hearing loss and tinnitus.   Eyes: Negative for blurred vision, double vision, photophobia and pain.  Respiratory: Negative for cough, sputum production and  shortness of breath.   Cardiovascular: Negative for chest pain.  Gastrointestinal: Negative for abdominal pain, nausea and vomiting.  Genitourinary: Negative for dysuria, flank pain, frequency and urgency.  Musculoskeletal: Positive for joint pain (Right knee). Negative for back pain, falls, myalgias and neck pain.  Neurological: Negative for dizziness, tingling, sensory change, focal weakness, loss of consciousness and headaches.  Endo/Heme/Allergies: Does not bruise/bleed easily.  Psychiatric/Behavioral: Negative for depression, memory loss  and substance abuse. The patient is not nervous/anxious.    Blood pressure (!) 177/87, pulse (!) 125, temperature 98 F (36.7 C), temperature source Oral, resp. rate 18, SpO2 97 %. Physical Exam  Constitutional: She appears well-developed and well-nourished. No distress.  HENT:  Head: Normocephalic and atraumatic.  Eyes: Conjunctivae are normal. Right eye exhibits no discharge. Left eye exhibits no discharge. No scleral icterus.  Neck: Normal range of motion.  Cardiovascular: Normal rate and regular rhythm.  Respiratory: Effort normal. No respiratory distress.  Musculoskeletal:     Comments: RLE No traumatic wounds, ecchymosis, or rash  Knee swollen, erythematous, warm, severe pain with palpation or ROM  No obvious knee or ankle effusion  Sens DPN, SPN, TN intact  Motor EHL, ext, flex, evers 5/5  DP 0, PT 0, 4+ edema  Neurological: She is alert.  Skin: Skin is warm and dry. She is not diaphoretic.  Psychiatric: She has a normal mood and affect. Her behavior is normal.    Assessment/Plan: Right knee TKA dislocation -- Will take to OR for CR. Continue NPO. Will be in knee immobilizer after surgery to prevent recurrance. Right knee infection -- Will aspirate and send for GS, C&S. Plan to start abx after cultures are taken. This is a very complex problem without good solutions. If the knee is indeed infected and can't be cleared with abx then  implant will need to be removed. It doesn't appear that there is enough bone for replacement which would likely necessitate amputation. This was discussed with pt and daughter who are not necessarily against that option.  Multiple medical problems including atrial fibrillation on coumadin (now supertherapeutic), hypertension, diabetes mellitus, chronic kidney disease stage III -- per primary service. As this will be a closed reduction will leave to medicine to decide how they'd like to address coumadin. She does not need to be reversed from our standpoint.    Lisette Abu, PA-C Orthopedic Surgery [phone] 02/22/2019, 9:13 AM

## 2019-02-22 NOTE — Anesthesia Postprocedure Evaluation (Signed)
Anesthesia Post Note  Patient: Robin Snow  Procedure(s) Performed: CLOSED MANIPULATION KNEE (Right Knee)     Patient location during evaluation: PACU Anesthesia Type: MAC Level of consciousness: awake and alert Pain management: pain level controlled Vital Signs Assessment: post-procedure vital signs reviewed and stable Respiratory status: spontaneous breathing, nonlabored ventilation and respiratory function stable Cardiovascular status: blood pressure returned to baseline and stable Postop Assessment: no apparent nausea or vomiting Anesthetic complications: no    Last Vitals:  Vitals:   02/22/19 1245 02/22/19 1421  BP:  138/86  Pulse: (!) 54 76  Resp: 14   Temp:  (!) 36.1 C  SpO2: 97% 96%    Last Pain:  Vitals:   02/22/19 1421  TempSrc:   PainSc: [place]

## 2019-02-22 NOTE — Anesthesia Procedure Notes (Signed)
Procedure Name: MAC Date/Time: 02/22/2019 2:00 PM Performed by: Renato Shin, CRNA Pre-anesthesia Checklist: Patient identified, Emergency Drugs available, Suction available and Patient being monitored Patient Re-evaluated:Patient Re-evaluated prior to induction Oxygen Delivery Method: Circle system utilized Preoxygenation: Pre-oxygenation with 100% oxygen Induction Type: IV induction Ventilation: Mask ventilation without difficulty Placement Confirmation: positive ETCO2 and breath sounds checked- equal and bilateral

## 2019-02-22 NOTE — ED Provider Notes (Signed)
[facility] EMERGENCY DEPARTMENT Provider Note   CSN: [id number] Arrival date & time: 02/22/19  0702     History   Chief Complaint Chief Complaint  Patient presents with   Leg Pain    Right    HPI Robin Snow is a 78 y.o. female CKD, diabetes, GERD, morbid obesity, OSA, CVA, pulmonary hypertension, chronic edema, A. fib RVR, CHF, bilateral knee replacements.  Patient was admitted on 02/02/2019 for acute cystitis, bacterial arthritis of the right knee, E. coli bacteremia.  She received IV Rocephin, Ancef and was discharged on a 10-day course of Keflex.  She was evaluated by orthopedic surgery at that time, it was felt this was a chronic infection of the right knee and she was to follow-up outpatient with an orthopedic surgeon in [facility].  Patient reports that her daughter handles all of her medications and she believes she finished her antibiotics.  She reports that over the last 3-4 days she has had increasing pain and swelling of her right knee as well as blisters appearing on her skin.  She describes a severe constant burning ache worsened with movement and palpation and without alleviating factors or radiation.  Denies fever/chills, headache, chest pain/shortness of breath, abdominal pain, nausea/vomiting, numbness/weakness or any additional concerns today. --- Supplemental history per patient's daughter Gloriajean Dell who is now at bedside.  Reports that she was transferring into bed the patient on Sunday, 02/19/2019 and heard a loud pop come from the right knee.  There is no fall and patient did endorse some increased pain since that time.    HPI  Past Medical History:  Diagnosis Date   AKI (acute kidney injury) (HCC) 04/2016   Anemia    Arthritis    Chronic atrial fibrillation (HCC)    Chronic edema    Chronic venous insufficiency    CKD (chronic kidney disease), stage III    Coagulopathy (HCC)    Diabetes mellitus    Type 2    Diverticulosis    Dyslipidemia    GERD (gastroesophageal reflux disease)    GI bleed 2015   a. lower GI from diverticular source 2015.   Hx of transfusion of packed red blood cells    Hypertension    Morbid obesity (HCC)    Obstructive sleep apnea    on CPAP   Pulmonary hypertension (HCC)    Pyelonephritis 04/2016   Renal infarct (HCC)    a. 04/2016- adm with flank pain, ? pyelo vs renal infarct in setting of recent subtherapeutic INR.   Sinus bradycardia    Stroke [facility]) 2015    Patient Active Problem List   Diagnosis Date Noted   Closed dislocation of right knee 02/22/2019   Atrial fibrillation with RVR (HCC) 02/03/2019   Acute cystitis with hematuria    Sepsis (HCC) 02/02/2019   Foot pain 07/19/2018   Warm antibody hemolytic anemia 11/24/2017   Pre-op evaluation 09/24/2017   Urinary frequency 09/06/2017   Obstructive sleep apnea    Long term (current) use of anticoagulants 12/25/2016   Orthostatic hypotension 09/08/2016   Chronic diastolic CHF (congestive heart failure) (HCC) 09/08/2016   Sinus pause: 4.02-4.80sec per telemetry 09/08/2016 09/08/2016   Tachy-brady syndrome (HCC) 09/08/2016   Sinus bradycardia 07/08/2016   CKD (chronic kidney disease) stage 3, GFR 30-59 ml/min 05/11/2016   Anemia 05/01/2016   Diabetes mellitus with complication [facility])    Physical deconditioning 12/14/2014   Essential hypertension 05/18/2014   HLD (hyperlipidemia) 05/18/2014  Chronic venous insufficiency 05/11/2014   Embolic stroke (HCC) 01/10/2014   Right hemiparesis (HCC) 01/10/2014   GERD (gastroesophageal reflux disease)    Chronic pain syndrome 09/07/2013   Painful total knee replacement (HCC) 08/01/2013   Permanent atrial fibrillation (HCC) 05/22/2010    Past Surgical History:  Procedure Laterality Date   CESAREAN SECTION     COLONOSCOPY Left 01/24/2013   Procedure: COLONOSCOPY;  Surgeon: Willis Modena, MD;  Location[facility] ENDOSCOPY;   Service: Endoscopy;  Laterality: Left;   ESOPHAGOGASTRODUODENOSCOPY Left 01/23/2013   Procedure: ESOPHAGOGASTRODUODENOSCOPY (EGD);  Surgeon: Willis Modena, MD;  Location: [facility] ENDOSCOPY;  Service: Endoscopy;  Laterality: Left;   EYE SURGERY Bilateral    cataract surgery   JOINT REPLACEMENT     PACEMAKER IMPLANT [name] 09/09/2016   Procedure: Pacemaker Implant;  Surgeon: Marinus Maw, MD;  Location: [facility] INVASIVE CV LAB;  Service: Cardiovascular;  Laterality: [name];   REPLACEMENT TOTAL KNEE BILATERAL     TUBAL LIGATION       OB History   No obstetric history on file.      Home Medications    Prior to Admission medications   Medication Sig Start Date End Date Taking? Authorizing Provider  diltiazem (CARDIZEM CD) 120 MG 24 hr capsule Take 1 capsule (120 mg total) by mouth daily. 02/08/19   Noralee Stain, DO  ferrous sulfate 325 (65 FE) MG EC tablet TAKE ONE TABLET BY MOUTH TWICE DAILY Patient taking differently: Take 325 mg by mouth 2 (two) times daily.  05/22/16   [name], MD  gabapentin (NEURONTIN) 300 MG capsule Take 1 capsule (300 mg total) by mouth 3 (three) times daily. Need appointment before next refill 09/16/18   [name], MD  ibuprofen (ADVIL) 200 MG tablet Take 400 mg by mouth every 6 (six) hours as needed for headache or mild pain.    [provider]  lovastatin (MEVACOR) 20 MG tablet Take 1 tablet (20 mg total) by mouth at bedtime. Need appointment for further refills 09/16/18   [name], MD  meclizine (ANTIVERT) 25 MG tablet Take 1 tablet (25 mg total) by mouth every 4 (four) hours as needed for dizziness. 09/06/17   Corwin Levins, MD  metoprolol tartrate (LOPRESSOR) 25 MG tablet Take 1 tablet (25 mg total) by mouth 2 (two) times daily. 02/08/19   Noralee Stain, DO  predniSONE (DELTASONE) 5 MG tablet Take 1 tablet (5 mg total) by mouth daily with breakfast. 03/09/18   Myrtis Ser, NP  warfarin (COUMADIN) 5 MG tablet TAKE 1  TABLET BY MOUTH DAILY EXCEPT 1 & 1/2 TABLETS ON WEDNESDAY OR AS DIRECTED BY ANTICOAGULATION CLINIC Patient taking differently: Take 7.5-10 mg by mouth See admin instructions. Take 7.5mg  MON TUES THUR FRI, and 10mg  on WED SAT SUN. 09/16/18   11/16/18, MD    Family History Family History  Problem Relation Age of Onset   Cancer Father    Stroke Maternal Aunt     Social History Social History   Tobacco Use   Smoking status: Former Smoker    Packs/day: 0.12    Years: 34.00    Pack years: 4.08    Types: Cigarettes    Quit date: 07/07/1993    Years since quitting: 25.6   Smokeless tobacco: Never Used  Substance Use Topics   Alcohol use: No    Comment: no longer drinks alcohol   Drug use: No     Allergies   Aspirin, Rofecoxib, and  Hydrocodone   Review of Systems Review of Systems Ten systems are reviewed and are negative for acute change except as noted in the HPI   Physical Exam Updated Vital Signs BP (!) 158/90    Pulse 82    Temp 98 F (36.7 C) (Oral)    Resp 19    SpO2 97%   Physical Exam Constitutional:      General: She is not in acute distress.    Appearance: Normal appearance. She is well-developed. She is not ill-appearing or diaphoretic.  HENT:     Head: Normocephalic and atraumatic.     Right Ear: External ear normal.     Left Ear: External ear normal.     Nose: Nose normal.  Eyes:     General: Vision grossly intact. Gaze aligned appropriately.     Pupils: Pupils are equal, round, and reactive to light.  Neck:     Musculoskeletal: Normal range of motion.     Trachea: Trachea and phonation normal. No tracheal deviation.  Cardiovascular:     Pulses:          Dorsalis pedis pulses are 1+ on the right side and 1+ on the left side.  Pulmonary:     Effort: Pulmonary effort is normal. No respiratory distress.  Abdominal:     General: There is no distension.     Palpations: Abdomen is soft.     Tenderness: There is no abdominal tenderness.  There is no guarding or rebound.  Musculoskeletal:     Right knee: She exhibits decreased range of motion, swelling and erythema. Tenderness found.  Feet:     Right foot:     Protective Sensation: 3 sites tested. 3 sites sensed.     Left foot:     Protective Sensation: 3 sites tested. 3 sites sensed.  Skin:    General: Skin is warm and dry.     Comments: Multiple blisters present to right knee, appear filled with serosanguineous fluid.  Neurological:     Mental Status: She is alert.     GCS: GCS eye subscore is 4. GCS verbal subscore is 5. GCS motor subscore is 6.     Comments: Speech is clear and goal oriented, follows commands Major Cranial nerves without deficit, no facial droop Moves extremities without ataxia, coordination intact  Psychiatric:        Behavior: Behavior normal.      ED Treatments / Results  Labs (all labs ordered are listed, but only abnormal results are displayed) Labs Reviewed  CBC WITH DIFFERENTIAL/PLATELET - Abnormal; Notable for the following components:      Result Value   WBC 14.9 (*)    RBC 2.95 (*)    Hemoglobin 8.1 (*)    HCT 25.6 (*)    RDW 17.2 (*)    nRBC 0.9 (*)    Neutro Abs 10.3 (*)    Monocytes Absolute 1.6 (*)    Abs Immature Granulocytes 0.56 (*)    All other components within normal limits  BASIC METABOLIC PANEL - Abnormal; Notable for the following components:   Glucose, Bld 135 (*)    Creatinine, Ser 1.09 (*)    Calcium 8.2 (*)    GFR calc non Af Amer 48 (*)    GFR calc Af Amer 56 (*)    All other components within normal limits  PROTIME-INR - Abnormal; Notable for the following components:   Prothrombin Time 62.9 (*)    INR 7.3 (*)  All other components within normal limits  CULTURE, BLOOD (ROUTINE X 2)  CULTURE, BLOOD (ROUTINE X 2)  SARS CORONAVIRUS 2 BY RT PCR (HOSPITAL ORDER, PERFORMED IN [facility])  LACTIC ACID, PLASMA  LACTIC ACID, PLASMA    EKG EKG Interpretation  Date/Time:  Wednesday  February 22 2019 07:58:12 EST Ventricular Rate:  83 PR Interval:    QRS Duration: 95 QT Interval:  344 QTC Calculation: 405 R Axis:   -15 Text Interpretation: Sinus or ectopic atrial rhythm Atrial premature complexes in couplets Borderline left axis deviation Confirmed by Virgina Norfolk [phone]) on 02/22/2019 8:22:06 AM   Radiology Dg Knee Complete 4 Views Right  Result Date: 02/22/2019 CLINICAL DATA:  Increasing knee pain, initial encounter EXAM: RIGHT KNEE - COMPLETE 4+ VIEW COMPARISON:  02/02/2019 FINDINGS: The tibial component of the knee prosthesis has been dislocated anteriorly with respect to the femoral component. Considerable joint effusion is noted new from the prior exam. Lucency is noted along the lateral aspect of the tibial component somewhat suspicious for loosening. This is stable from the prior exam. Significant remodeling of the patella is noted. IMPRESSION: New large joint effusion. Anterior dislocation of the femoral component with respect to the tibial component. Stable lucency is noted in the periprosthetic regions suspicious for loosening. Critical Value/emergent results were called by telephone at the time of interpretation on 02/22/2019 at 8:10 am to [facility], PA , who verbally acknowledged these results. Electronically Signed   By: Alcide Clever M.D.   On: 02/22/2019 08:11    Procedures .Critical Care Performed by: Bill Salinas, PA-C Authorized by: Bill Salinas, PA-C   Critical care provider statement:    Critical care time (minutes):  31   Critical care was necessary to treat or prevent imminent or life-threatening deterioration of the following conditions: Supratherapeutic INR requiring reversal.   Critical care was time spent personally by me on the following activities:  Discussions with consultants, evaluation of patient's response to treatment, examination of patient, ordering and performing treatments and interventions, ordering and review of  laboratory studies, ordering and review of radiographic studies, pulse oximetry, re-evaluation of patient's condition, obtaining history from patient or surrogate and review of old charts   (including critical care time)  Medications Ordered in ED Medications  diltiazem (CARDIZEM CD) 24 hr capsule 120 mg (120 mg Oral Given 02/22/19 0957)  phytonadione (VITAMIN K) 2.5 mg in dextrose 5 % 50 mL IVPB (has no administration in time range)  metoprolol tartrate (LOPRESSOR) tablet 25 mg (has no administration in time range)  sodium chloride flush (NS) 0.9 % injection 3 mL (has no administration in time range)  acetaminophen (TYLENOL) tablet 650 mg (has no administration in time range)    Or  acetaminophen (TYLENOL) suppository 650 mg (has no administration in time range)  ondansetron (ZOFRAN) tablet 4 mg (has no administration in time range)    Or  ondansetron (ZOFRAN) injection 4 mg (has no administration in time range)  albuterol (PROVENTIL) (2.5 MG/3ML) 0.083% nebulizer solution 2.5 mg (has no administration in time range)  fentaNYL (SUBLIMAZE) injection 50 mcg (50 mcg Intravenous Given 02/22/19 0855)     Initial Impression / Assessment and Plan / ED Course  I have reviewed the triage vital signs and the nursing notes.  Pertinent labs & imaging results that were available during my care of the patient were reviewed by me and considered in my medical decision making (see chart for details).   EKG: Sinus or  ectopic atrial rhythm Atrial premature complexes in couplets Borderline left axis deviation Confirmed by Virgina NorfolkAdam, [place] [phone]) on 02/22/2019 8:22:06 AM  DG Right Knee:  IMPRESSION:  New large joint effusion.    Anterior dislocation of the femoral component with respect to the  tibial component.    Stable lucency is noted in the periprosthetic regions suspicious for  loosening.   CBC shows leukocytosis of 14.9 with left shift improving from prior, hemoglobin 8.1 decreased from  previous at 9  BMP nonacute  PT/INR elevated, INR 7.3 - Discussed case with orthopedic surgery, who plan to take 2 OR now for a closed reduction.  Orthopedics has asked to hold on antibiotics for now.  Initially plan was to reverse INR with vitamin K however as they are not making any incisions today we will defer to hospitalist service for further.  Rapid Covid test ordered per orthopedic request.  Additionally patient had several episodes of A. fib RVR on the monitor with a rate up to 200 bpm, these self resolved, she was given her home dose of Cardizem which she had not taken this morning.  No chest pain or shortness of breath. - Patient has been admitted to hospitalist service for further evaluation and management.  Patient was seen and evaluated by Dr. Lockie Molauratolo during this visit.  Buddy DutyBarbara P Helmstetter was evaluated in Emergency Department on 02/22/2019 for the symptoms described in the history of present illness. She was evaluated in the context of the global COVID-19 pandemic, which necessitated consideration that the patient might be at risk for infection with the SARS-CoV-2 virus that causes COVID-19. Institutional protocols and algorithms that pertain to the evaluation of patients at risk for COVID-19 are in a state of rapid change based on information released by regulatory bodies including the CDC and federal and state organizations. These policies and algorithms were followed during the patient's care in the ED.  Note: Portions of this report may have been transcribed using voice recognition software. Every effort was made to ensure accuracy; however, inadvertent computerized transcription errors may still be present. Final Clinical Impressions(s) / ED Diagnoses   Final diagnoses:  Dislocation of right knee, initial encounter  Supratherapeutic INR    ED Discharge Orders    None       Elizabeth PalauMorelli, Deaisha Welborn A, PA-C 02/22/19 1016    Curatolo, Madelaine Bhatdam, DO 02/22/19 1038

## 2019-02-22 NOTE — Progress Notes (Signed)
ANTICOAGULATION CONSULT NOTE - Initial Consult  Pharmacy Consult for Warfarin Indication: atrial fibrillation  Allergies  Allergen Reactions  . Aspirin Hives and Swelling    Angioedema   . Rofecoxib Hives  . Hydrocodone Hives    Tolerates oxycodone and tramadol    Patient Measurements:    Vital Signs: Temp: 98 F (36.7 C) (11/18 0715) Temp Source: Oral (11/18 0715) BP: 140/67 (11/18 1145) Pulse Rate: 69 (11/18 1145)  Labs: Recent Labs    02/22/19 0814  HGB 8.1*  HCT 25.6*  PLT 364  LABPROT 62.9*  INR 7.3*  CREATININE 1.09*    CrCl cannot be calculated (Unknown ideal weight.).   Medical History: Past Medical History:  Diagnosis Date  . AKI (acute kidney injury) ([place]) 04/2016  . Anemia   . Arthritis   . Chronic atrial fibrillation ([place])   . Chronic edema   . Chronic venous insufficiency   . CKD (chronic kidney disease), stage III   . Coagulopathy ([place])   . Diabetes mellitus    Type 2  . Diverticulosis   . Dyslipidemia   . GERD (gastroesophageal reflux disease)   . GI bleed 2015   a. lower GI from diverticular source 2015.  [name] Hx of transfusion of packed red blood cells   . Hypertension   . Morbid obesity ([place])   . Obstructive sleep apnea    on CPAP  . Pulmonary hypertension ([place])   . Pyelonephritis 04/2016  . Renal infarct ([place])    a. 04/2016- adm with flank pain, ? pyelo vs renal infarct in setting of recent subtherapeutic INR.  [name] Sinus bradycardia   . Stroke [facility]) 2015    Assessment: 78 year old female on Warfarin prior to admission for atrial fibrillation   INR elevated at 7.3 on admission. Patient to have closed reduction of knee and joint aspiration and culture. Orthopedics ordered IV Vitamin K 5mg  x1 for reversal in ED. Home regimen was 10mg  Wednesdays, Saturdays, and Sundays and 7.5mg  all other days. Her last dose was 11/17 at 2130PM.   Goal of Therapy:  INR 2-3 Monitor platelets by anticoagulation protocol: Yes   Plan:  Hold  Warfarin today due to elevated INR.  Follow-up daily PT/INR    Sloan Leiter, PharmD, BCPS, BCCCP Clinical Pharmacist Please refer to [facility] for [place] numbers 02/22/2019,11:53 AM

## 2019-02-22 NOTE — ED Notes (Signed)
Pt cleaned up.  

## 2019-02-22 NOTE — Transfer of Care (Signed)
Immediate Anesthesia Transfer of Care Note  Patient: Robin Snow  Procedure(s) Performed: CLOSED MANIPULATION KNEE (Right Knee)  Patient Location: PACU  Anesthesia Type:MAC  Level of Consciousness: awake and patient cooperative  Airway & Oxygen Therapy: Patient Spontanous Breathing and Patient connected to face mask oxygen  Post-op Assessment: Report given to RN and Post -op Vital signs reviewed and stable  Post vital signs: Reviewed and stable  Last Vitals:  Vitals Value Taken Time  BP 138/86 02/22/19 1421  Temp    Pulse 117 02/22/19 1426  Resp 24 02/22/19 1426  SpO2 100 % 02/22/19 1426  Vitals shown include unvalidated device data.  Last Pain:  Vitals:   02/22/19 1204  TempSrc:   PainSc: 4          Complications: No apparent anesthesia complications

## 2019-02-22 NOTE — H&P (Addendum)
History and Physical    Robin Snow XBJ:[id number] DOB: 20-Aug-1940 DOA: 02/22/2019  Referring MD/NP/PA: Harlene SaltsBrandon Morelli, PA-C PCP: Myrlene Brokerrawford, Elizabeth A, MD  Patient coming from: Home  Chief Complaint: Right knee pain  I have personally briefly reviewed patient's old medical records in [facility]   HPI: Robin Snow is a 78 y.o. female with medical history significant of paroxysmal atrial fibrillation on anticoagulation Coumadin, hypertension, diabetes mellitus type II, and chronic kidney disease stage III.  She presents with complaints right knee pain and swelling. Hospitalized from 10/29-11/4 with sepsis related to E. coli bacteremia, UTI, and bacterial arthritis right knee.  Treated with Rocephin, then switched to Ancef, and discharged home to on Keflex to complete a 10-day course.  Patient had initially done okay when she got home and had completed antibiotics as prescribed.  3 days ago while the daughter was helping her transition she had heard a loud pop in the right knee.  Since that time she has had increasing pain and swelling of the right knee to the point in which she started developing fluid-filled blisters on her skin.  Associated symptoms include increased warmth, but the patient has not had any fever.  She had gone from being unable to bear weight on the right knee and now cannot have anyone touch it without crying out in pain.  Denies any significant nausea, vomiting, cough, chest pain, shortness of breath, diarrhea, blood in stools, or dysuria symptoms.  ED Course: Upon admission into the emergency department patient was noted to be afebrile, pulse 82-1 25, blood pressure 158/92-177/87, and all other vital signs maintained.  Labs significant for WBC 14.9, hemoglobin 8.1, and INR 7.3.  X-rays of the right knee revealed new large right pleural effusion with anterior dislocation.  Blood cultures were obtained, but no initial lactic acid.  Dr. Jena GaussHaddix of orthopedics was  formally consulted and recommended hold off on antibiotics at this time.  They plan on taking the patient to the operating room later on this evening  Review of systems: A complete 10 point review of systems was performed and negative except for as noted above in HPI.   Past Medical History:  Diagnosis Date  . AKI (acute kidney injury) (HCC) 04/2016  . Anemia   . Arthritis   . Chronic atrial fibrillation (HCC)   . Chronic edema   . Chronic venous insufficiency   . CKD (chronic kidney disease), stage III   . Coagulopathy (HCC)   . Diabetes mellitus    Type 2  . Diverticulosis   . Dyslipidemia   . GERD (gastroesophageal reflux disease)   . GI bleed 2015   a. lower GI from diverticular source 2015.  [name]. Hx of transfusion of packed red blood cells   . Hypertension   . Morbid obesity (HCC)   . Obstructive sleep apnea    on CPAP  . Pulmonary hypertension (HCC)   . Pyelonephritis 04/2016  . Renal infarct (HCC)    a. 04/2016- adm with flank pain, ? pyelo vs renal infarct in setting of recent subtherapeutic INR.  [name]. Sinus bradycardia   . Stroke [facility](HCC) 2015    Past Surgical History:  Procedure Laterality Date  . CESAREAN SECTION    . COLONOSCOPY Left 01/24/2013   Procedure: COLONOSCOPY;  Surgeon: Willis ModenaWilliam Outlaw, MD;  [facility];  Service: Endoscopy;  Laterality: Left;  . ESOPHAGOGASTRODUODENOSCOPY Left 01/23/2013   Procedure: ESOPHAGOGASTRODUODENOSCOPY (EGD);  Surgeon: Willis ModenaWilliam Outlaw, MD;  [facility];  Service: Endoscopy;  Laterality: Left;  . EYE SURGERY Bilateral    cataract surgery  . JOINT REPLACEMENT    . PACEMAKER IMPLANT [name] 09/09/2016   Procedure: Pacemaker Implant;  Surgeon: Evans Lance, MD;  Location: [place] CV LAB;  Service: Cardiovascular;  Laterality: [name];  . REPLACEMENT TOTAL KNEE BILATERAL    . TUBAL LIGATION       reports that she quit smoking about 25 years ago. Her smoking use included cigarettes. She has a 4.08 pack-year smoking  history. She has never used smokeless tobacco. She reports that she does not drink alcohol or use drugs.  Allergies  Allergen Reactions  . Aspirin Hives and Swelling    Angioedema   . Rofecoxib Hives  . Hydrocodone Hives    Tolerates oxycodone and tramadol    Family History  Problem Relation Age of Onset  . Cancer Father   . Stroke Maternal Aunt     Prior to Admission medications   Medication Sig Start Date End Date Taking? Authorizing Provider  diltiazem (CARDIZEM CD) 120 MG 24 hr capsule Take 1 capsule (120 mg total) by mouth daily. 02/08/19   Dessa Phi, DO  ferrous sulfate 325 (65 FE) MG EC tablet TAKE ONE TABLET BY MOUTH TWICE DAILY Patient taking differently: Take 325 mg by mouth 2 (two) times daily.  05/22/16   [name], MD  gabapentin (NEURONTIN) 300 MG capsule Take 1 capsule (300 mg total) by mouth 3 (three) times daily. Need appointment before next refill 09/16/18   [name], MD  ibuprofen (ADVIL) 200 MG tablet Take 400 mg by mouth every 6 (six) hours as needed for headache or mild pain.    [provider]  lovastatin (MEVACOR) 20 MG tablet Take 1 tablet (20 mg total) by mouth at bedtime. Need appointment for further refills 09/16/18   [name], MD  meclizine (ANTIVERT) 25 MG tablet Take 1 tablet (25 mg total) by mouth every 4 (four) hours as needed for dizziness. 09/06/17   Biagio Borg, MD  metoprolol tartrate (LOPRESSOR) 25 MG tablet Take 1 tablet (25 mg total) by mouth 2 (two) times daily. 02/08/19   Dessa Phi, DO  predniSONE (DELTASONE) 5 MG tablet Take 1 tablet (5 mg total) by mouth daily with breakfast. 03/09/18   Maryanna Shape, NP  warfarin (COUMADIN) 5 MG tablet TAKE 1 TABLET BY MOUTH DAILY EXCEPT 1 & 1/2 TABLETS ON WEDNESDAY OR AS DIRECTED BY ANTICOAGULATION CLINIC Patient taking differently: Take 7.5-10 mg by mouth See admin instructions. Take 7.5mg  MON TUES THUR FRI, and 10mg  on WED SAT SUN. 09/16/18   [name], MD    Physical Exam:  Constitutional: Elderly female who appears to be in discomfort e Vitals:   02/22/19 0715 02/22/19 0721 02/22/19 0830 02/22/19 0915  BP: (!) 162/97  (!) 177/87 (!) 158/90  Pulse: 83  (!) 125 82  Resp: 17  18 19   Temp: 98 F (36.7 C)     TempSrc: Oral     SpO2: 98% 100% 97% 97%   Eyes: PERRL, lids and conjunctivae normal ENMT: Mucous membranes are moist. Posterior pharynx clear of any exudate or lesions.Normal dentition.  Neck: normal, supple, no masses, no thyromegaly Respiratory: clear to auscultation bilaterally, no wheezing, no crackles. Normal respiratory effort. No accessory muscle use.  Cardiovascular: Irregular with heart rates intermittently into the 140s, no murmurs / rubs / gallops. No extremity edema. 2+ pedal pulses. No carotid bruits.  Abdomen: no tenderness, no masses palpated. No hepatosplenomegaly. Bowel sounds positive.  Musculoskeletal: no clubbing / cyanosis.  Right knee joint swelling present with increased warmth. Skin: Bullae present on the lateral side of the right knee Neurologic: CN 2-12 grossly intact. Sensation intact, DTR normal. Strength 5/5 in all 4.  Psychiatric: Normal judgment and insight. Alert and oriented x 3. Normal mood.     Labs on Admission: I have personally reviewed following labs and imaging studies  CBC: Recent Labs  Lab 02/22/19 0814  WBC 14.9*  NEUTROABS 10.3*  HGB 8.1*  HCT 25.6*  MCV 86.8  PLT 364   Basic Metabolic Panel: Recent Labs  Lab 02/22/19 0814  NA 136  K 3.9  CL 100  CO2 22  GLUCOSE 135*  BUN 9  CREATININE 1.09*  CALCIUM 8.2*   GFR: CrCl cannot be calculated (Unknown ideal weight.). Liver Function Tests: No results for input(s): AST, ALT, ALKPHOS, BILITOT, PROT, ALBUMIN in the last 168 hours. No results for input(s): LIPASE, AMYLASE in the last 168 hours. No results for input(s): AMMONIA in the last 168 hours. Coagulation Profile: Recent Labs  Lab 02/22/19 0814   INR 7.3*   Cardiac Enzymes: No results for input(s): CKTOTAL, CKMB, CKMBINDEX, TROPONINI in the last 168 hours. BNP (last 3 results) No results for input(s): PROBNP in the last 8760 hours. HbA1C: No results for input(s): HGBA1C in the last 72 hours. CBG: No results for input(s): GLUCAP in the last 168 hours. Lipid Profile: No results for input(s): CHOL, HDL, LDLCALC, TRIG, CHOLHDL, LDLDIRECT in the last 72 hours. Thyroid Function Tests: No results for input(s): TSH, T4TOTAL, FREET4, T3FREE, THYROIDAB in the last 72 hours. Anemia Panel: No results for input(s): VITAMINB12, FOLATE, FERRITIN, TIBC, IRON, RETICCTPCT in the last 72 hours. Urine analysis:    Component Value Date/Time   COLORURINE YELLOW 02/02/2019 1923   APPEARANCEUR CLEAR 02/02/2019 1923   LABSPEC 1.013 02/02/2019 1923   PHURINE 5.0 02/02/2019 1923   GLUCOSEU NEGATIVE 02/02/2019 1923   GLUCOSEU NEGATIVE 09/06/2017 1200   HGBUR SMALL (A) 02/02/2019 1923   BILIRUBINUR NEGATIVE 02/02/2019 1923   BILIRUBINUR Neg 10/18/2017 1453   KETONESUR NEGATIVE 02/02/2019 1923   PROTEINUR NEGATIVE 02/02/2019 1923   UROBILINOGEN 0.2 10/18/2017 1453   UROBILINOGEN 1.0 09/06/2017 1200   NITRITE POSITIVE (A) 02/02/2019 1923   LEUKOCYTESUR LARGE (A) 02/02/2019 1923   Sepsis Labs: No results found for this or any previous visit (from the past 240 hour(s)).   Radiological Exams on Admission: Dg Knee Complete 4 Views Right  Result Date: 02/22/2019 CLINICAL DATA:  Increasing knee pain, initial encounter EXAM: RIGHT KNEE - COMPLETE 4+ VIEW COMPARISON:  02/02/2019 FINDINGS: The tibial component of the knee prosthesis has been dislocated anteriorly with respect to the femoral component. Considerable joint effusion is noted new from the prior exam. Lucency is noted along the lateral aspect of the tibial component somewhat suspicious for loosening. This is stable from the prior exam. Significant remodeling of the patella is noted. IMPRESSION:  New large joint effusion. Anterior dislocation of the femoral component with respect to the tibial component. Stable lucency is noted in the periprosthetic regions suspicious for loosening. Critical Value/emergent results were called by telephone at the time of interpretation on 02/22/2019 at 8:10 am to [facility] , who verbally acknowledged these results. Electronically Signed   By: Alcide Clever M.D.   On: 02/22/2019 08:11    EKG: Independently reviewed.  Sinus versus ectopic rhythm at 83 bpm  with PACs  Assessment/Plan Right knee dislocation, right knee joint effusion: Acute.  Patient presents with inability to bear weight on the right knee.  X-ray imaging revealed a dislocation with new large joint effusion.  Dr. Jena Gauss of orthopedics was consulted and plan on taking the patient to the operating room later today.  -Admit to a medical telemetry bed -N.p.o. for possible procedure -Morphine IV as needed for pain -Appreciate orthopedic surgery, we will follow-up for further recommendations  Sepsis: She presents with tachycardia and WBC elevated at 14.9.  Patient had just recently been hospitalized for sepsis secondary to E. coli bacteremia for which ultimately patient was discharged home on Keflex.  Blood cultures were obtained, but no initial lactic acid was obtained.  Suspecting knee could be a possible source of infection. -Follow-up blood cultures and joint aspirate cultures -Check lactic acid  -Holding off on antibiotics until after aspiration  Supratherapeutic INR, paroxysmal atrial fibrillation: Acute.  Patient presents with INR 7.3 on admission.  Heart rates have intermittently been into the 140s. -Give 2.5 mg of vitamin K IV -Hold Coumadin  -Continue Cardizem and metoprolol -Coumadin Per pharmacy -Daily INR  Normocytic anemia: Acute on chronic.  Patient presents with hemoglobin 8.1g/dL, but appears to have been trending down over the last month.  Question if patient is  bleeding into the right knee, but appears to possibly have a history of warm antibody hemolytic anemia. -Type and screen for possible need of blood products -Check haptoglobin, LDH, reticulocyte count, and peripheral smear -Continue to monitor H&H -Transfuse blood products for drops in hemoglobin as needed  Essential hypertension: Blood pressures currently stable.  Home medications include metoprolol and Cardizem -Continue home regimen  Diabetes mellitus type 2: Controlled.  Last hemoglobin A1c 5.8 on 02/03/2019.  Patient appears to be diet-controlled initial blood sugars 135. -Continue heart healthy and carb modified diet  Chronic kidney disease stage III a: Creatinine 1.09 on admission which appears near baseline or slightly above. -Continue to monitor  Hyperlipidemia -Continue pharmacy substitution of pravastatin  DVT prophylaxis: None due to supratherapeutic INR Code Status: Full Family Communication: Discussed plan of care with the patient and daughter present at bedside Disposition Plan: To be determined Consults called: Orthopedics Admission status: Observation  Clydie Braun MD [facility] Pager [phone]   If 7PM-7AM, please contact night-coverage [email or web address] Password [email or web address]  02/22/2019, 9:22 AM

## 2019-02-22 NOTE — ED Notes (Signed)
Pt refused rectal temp.

## 2019-02-22 NOTE — Op Note (Signed)
Orthopaedic Surgery Operative Note (CSN: [id number] ) Date of Surgery: 02/22/2019  Admit Date: 02/22/2019   Diagnoses: Pre-Op Diagnoses: Right knee dislocation Possible chronic right knee infection   Post-Op Diagnosis: Same  Procedures: 1. CPT 27552-Closed reduction of right knee prosthetic dislocation 2. CPT 20610-Aspiration of right knee  Surgeons : Primary: Snow, Thomasene Lot, MD  Assistant: Patrecia Pace, PA-C  Location: OR 4   Anesthesia: Conscious sedation  Antibiotics: None   Tourniquet time:None    Estimated Blood Loss:Minimal  Complications:* No complications entered in OR log *   Specimens: ID Type Source Tests Collected by Time Destination  A : synovial fluid right knee Body Fluid Synovial, Right Knee AEROBIC/ANAEROBIC CULTURE (SURGICAL/DEEP WOUND) Snow, Thomasene Lot, MD 02/22/2019 1401      Implants: * No implants in log *   Indications for Surgery: 78 year old female who had a revision total knee arthroplasty in 2019.  Has been having chronic pain ever since.  She presented to the hospital a few weeks ago for sepsis for which the knee was a potential source but no aspiration was performed.  She returned to the ED this morning with pain for 3 days after transfer.  X-ray showed a knee dislocation of her prosthesis.  She also had a supratherapeutic INR of greater than 7.  Due to the dislocation of her knee as well as the large effusion on her knee I felt that a closed reduction under anesthesia with aspiration was appropriate.  Risks and benefits were discussed with the patient's daughter.  She agreed to proceed with surgery and consent was obtained.  Operative Findings: 1.  Unstable right total knee prosthesis treated with a closed reduction and placement of knee immobilizer. 2.  Aspiration of right knee with approximately 10 cc of hematoma that was removed.  No gross purulence.  Procedure: The patient was identified in the preoperative holding area. Consent was  confirmed with the patient and their family and all questions were answered. The operative extremity was marked after confirmation with the patient. she was then brought back to the operating room by our anesthesia colleagues.  She was carefully transferred over to a radiolucent flat top table.  She was placed under conscious sedation.  A timeout was performed to verify the patient procedure and the extremity.  Using fluoroscopic imaging it showed that the knee had an anterior dislocation.  The knee was flexed traction was applied anterior translation of the femoral component was performed.  An audible click was obtained.  Fluoroscopic imaging showed that the prosthesis was reduced and the post was underneath the femoral component once again.  The right lower extremity and knee were prepped with ChloraPrep.  I sterilely performed an aspiration using an 18-gauge needle.  Approximately 10 cc of hematoma was aspirated.  No purulence was obtained.  A compressive wrap consisting of 4 x 4's and Ace wrap was placed over the knee.  She was placed in a knee immobilizer after this.  She was then awoken from anesthesia and taken the PACU in stable condition.  Post Op Plan/Instructions: I would recommend nonweightbearing to the right lower extremity.  Continue knee immobilizer at all times.  Correction of her INR.  No role for surgical intervention at this time.  Follow-up cultures.  I was present and performed the entire surgery.  Patrecia Pace, PA-C did assist me throughout the case. An assistant was necessary given the difficulty in approach, maintenance of reduction and ability to instrument the fracture.   Robin Bihari Haddix,  MD [facility]

## 2019-02-22 NOTE — ED Notes (Signed)
Dr. Ronnald Nian and [place] PA made aware of pts INR.

## 2019-02-22 NOTE — ED Notes (Signed)
Dr. Tamala Julian aware of pts hemoglobin

## 2019-02-22 NOTE — ED Provider Notes (Signed)
Medical screening examination/treatment/procedure(s) were conducted as a shared visit with non-physician practitioner(s) and myself.  I personally evaluated the patient during the encounter. Briefly, the patient is a 78 y.o. female with history of CKD, A. fib, stroke, morbid obesity, chronic venous insufficiency, chronic right knee pain who presents the ED with right knee pain.  Patient with unremarkable vitals.  No fever.  Recent admission for E. coli bacteremia.  Suspected to be possibly from right knee.  Appears that she has chronic right knee infection.  Was supposed to follow-up with orthopedics outpatient.  Has ongoing right knee pain but no other specific complaints.  It appears that she has finished her course of antibiotics.  She is overall poor historian.  Right knee is warm and swollen.  Will talk with family member when they arrive for more history.  We will get basic lab work and imaging of right knee.  Patient with no fever no signs to suggest sepsis at this time.  X-ray shows anterior dislocation of femoral component of her prosthesis in relation to her tibia.  Upon arrival of family it appears that maybe this injury occurred about 4 days ago during a transfer from her wheelchair.  Patient has large effusion as well that I suspect is likely hematoma.  Patient otherwise with mild leukocytosis but no significant anemia, electrolyte abnormality.  INR is elevated at 7.3.  Orthopedics would like to take patient to the OR for reduction and revision of her prosthesis this afternoon.  Will touch base with them about INR reversal.  Will give IV vitamin K and hold Coumadin at this time for INR reversal as only reduction of prosthesis today and ortho states will not be making any incisions.  Will admit to medicine for further care.  This chart was dictated using voice recognition software.  Despite best efforts to proofread,  errors can occur which can change the documentation meaning.     EKG  Interpretation  Date/Time:  Wednesday February 22 2019 07:58:12 EST Ventricular Rate:  83 PR Interval:    QRS Duration: 95 QT Interval:  344 QTC Calculation: 405 R Axis:   -15 Text Interpretation: Sinus or ectopic atrial rhythm Atrial premature complexes in couplets Borderline left axis deviation Confirmed by Lennice Sites [phone]) on 02/22/2019 8:22:06 AM           Lennice Sites, DO 02/22/19 7782

## 2019-02-22 NOTE — Telephone Encounter (Signed)
Agree with ER or urgent care as we cannot have any fevers in the office.

## 2019-02-22 NOTE — ED Notes (Signed)
Patient transported to X-ray 

## 2019-02-22 NOTE — ED Notes (Signed)
Transferred to OR.

## 2019-02-22 NOTE — ED Triage Notes (Addendum)
Per [facility]: pt complaining of right leg pain for the last 5 days. Pts leg is hot to the touch and there are some boils present on the leg. One of the boils popped en route to hospital. Pt has reportedly been non ambulatory for the last 5 days due to pain. Pt unsure if she finished her recent course of antibiotics as her daughter gives her medication to her. Pt alert and oriented. Pt able to move toes, PMS is intact bilaterally. Pt states that she took 2, 500 mg, tylenol around 6 am today.

## 2019-02-22 NOTE — ED Notes (Signed)
Dr. Curatolo at bedside.  

## 2019-02-22 NOTE — ED Notes (Signed)
Dr. Tamala Julian aware of pts lactic acid.

## 2019-02-22 NOTE — Anesthesia Preprocedure Evaluation (Addendum)
Anesthesia Evaluation    Reviewed: Allergy & Precautions, Patient's Chart, lab work & pertinent test results, reviewed documented beta blocker date and time   Airway Mallampati: I  TM Distance: >3 FB Neck ROM: Full    Dental  (+) Edentulous Upper, Edentulous Lower, Dental Advisory Given Unable to remove upper dentures:   Pulmonary sleep apnea and Continuous Positive Airway Pressure Ventilation , former smoker,  Former smoker, quit 1995, 4 pack year history   Pulmonary exam normal breath sounds clear to auscultation       Cardiovascular hypertension, Pt. on medications and Pt. on home beta blockers Normal cardiovascular exam+ dysrhythmias Atrial Fibrillation + Valvular Problems/Murmurs MR  Rhythm:Regular Rate:Normal  Afib RVR on day of admission 02/02/19 s/p fall at home onto R knee  Last echo 04/2018:   1. The left ventricle has normal systolic function of 50-53%. The cavity size is normal. There is concentric left ventricular hypertrophy. No evidence of left ventricular regional wall motion abnormalities.  2. No evidence of left ventricular regional wall motion abnormalities.  3. Left ventricular diastology could not be evaluated secondary to atrial fibrillation, but diastolic tissue Doppler velocities are normal for age.  4. The right ventricle has normal systolic function. The cavity in normal in size. There is no increase in right ventricular wall thickness.  5. Right ventricular systolic pressure is moderately elevated with an estimated pressure of 54.7 mmHg.  6. Severely dilated left atrial size.  7. Moderately dilated right atrial size.  8. There is mild mitral annular calcification. Mitral valve regurgitation is mild to moderate.  9. Tricuspid valve regurgitation is mild-moderate. 10. The ascending aorta and aortic root are normal in size and structure. 11. The inferior vena cava was dilated in size with <50% respiratory  variability, consistent with increased right atrial pressure.   Pacemaker placed 2018 for tachy-brady    Neuro/Psych CVA negative psych ROS   GI/Hepatic Neg liver ROS, GERD  ,Hx GIB on coumadin, diverticulosis   Endo/Other  diabetes, Well Controlled, Type 2Morbid obesity  Renal/GU CRFRenal diseaseHx pyelo w/ AKI 2018  Cr 1.09, CKD 3   Recent urosepsis    Musculoskeletal  (+) Arthritis , Osteoarthritis,  S/p fall at home from bed onto right knee 10/29- admitted at that time for acute cystitis, bacterial arthritis of the right knee, E. coli bacteremia. Was discharged home recently with instructions to F/U outpt with ortho but returned to ED reporting that over the last 3-4 days she has had increasing pain and swelling of her right knee as well as blisters appearing on her skin.  Reports that she was transferring into bed the patient on Sunday, 02/19/2019 and heard a loud pop come from the right knee.  Chronic right knee problems; has had both knees replaced in past   Abdominal (+) + obese,   Peds  Hematology  (+) anemia , Warm Ab hemolytic anemia, hct 25.6  plt 364  On coumadin- INR 7.3 today   Anesthesia Other Findings HLD    Overall very frail and deconditioned  Reproductive/Obstetrics negative OB ROS                            Anesthesia Physical Anesthesia Plan  ASA: IV  Anesthesia Plan: MAC   Post-op Pain Management:    Induction:   PONV Risk Score and Plan: 3 and Ondansetron, Propofol infusion and Treatment may vary due to age or medical condition  Airway Management Planned:  Natural Airway and Simple Face Mask  Additional Equipment: None  Intra-op Plan:   Post-operative Plan:   Informed Consent: I have reviewed the patients History and Physical, chart, labs and discussed the procedure including the risks, benefits and alternatives for the proposed anesthesia with the patient or authorized representative who has indicated  his/her understanding and acceptance.       Plan Discussed with: CRNA  Anesthesia Plan Comments: (D/w surgeon dropping Hb/hct in the setting of elevated INR. Decision to proceed with closed reduction, as no incisions will be made. Alerted by blood bank that the patient does have antibodies in her blood and there is no blood readily available for her. )       Anesthesia Quick Evaluation

## 2019-02-22 NOTE — Telephone Encounter (Signed)
Patient went to ED

## 2019-02-22 NOTE — Progress Notes (Signed)
18:55 - Received patient from PACU.  Patient A&Ox3, disoriented to time.  Techs at bedside cleaning up patient and getting vital signs.

## 2019-02-23 ENCOUNTER — Encounter (HOSPITAL_COMMUNITY): Payer: Self-pay | Admitting: Student

## 2019-02-23 DIAGNOSIS — N1831 Chronic kidney disease, stage 3a: Secondary | ICD-10-CM | POA: Diagnosis present

## 2019-02-23 DIAGNOSIS — I482 Chronic atrial fibrillation, unspecified: Secondary | ICD-10-CM | POA: Diagnosis present

## 2019-02-23 DIAGNOSIS — I69351 Hemiplegia and hemiparesis following cerebral infarction affecting right dominant side: Secondary | ICD-10-CM | POA: Diagnosis not present

## 2019-02-23 DIAGNOSIS — D62 Acute posthemorrhagic anemia: Secondary | ICD-10-CM | POA: Diagnosis not present

## 2019-02-23 DIAGNOSIS — R7881 Bacteremia: Secondary | ICD-10-CM | POA: Diagnosis present

## 2019-02-23 DIAGNOSIS — T8453XA Infection and inflammatory reaction due to internal right knee prosthesis, initial encounter: Secondary | ICD-10-CM | POA: Diagnosis present

## 2019-02-23 DIAGNOSIS — I4891 Unspecified atrial fibrillation: Secondary | ICD-10-CM | POA: Diagnosis not present

## 2019-02-23 DIAGNOSIS — J9600 Acute respiratory failure, unspecified whether with hypoxia or hypercapnia: Secondary | ICD-10-CM | POA: Diagnosis not present

## 2019-02-23 DIAGNOSIS — A419 Sepsis, unspecified organism: Secondary | ICD-10-CM | POA: Diagnosis not present

## 2019-02-23 DIAGNOSIS — Z89611 Acquired absence of right leg above knee: Secondary | ICD-10-CM | POA: Diagnosis not present

## 2019-02-23 DIAGNOSIS — D500 Iron deficiency anemia secondary to blood loss (chronic): Secondary | ICD-10-CM | POA: Diagnosis present

## 2019-02-23 DIAGNOSIS — I130 Hypertensive heart and chronic kidney disease with heart failure and stage 1 through stage 4 chronic kidney disease, or unspecified chronic kidney disease: Secondary | ICD-10-CM | POA: Diagnosis present

## 2019-02-23 DIAGNOSIS — J95821 Acute postprocedural respiratory failure: Secondary | ICD-10-CM | POA: Diagnosis not present

## 2019-02-23 DIAGNOSIS — L02419 Cutaneous abscess of limb, unspecified: Secondary | ICD-10-CM | POA: Diagnosis not present

## 2019-02-23 DIAGNOSIS — I5032 Chronic diastolic (congestive) heart failure: Secondary | ICD-10-CM | POA: Diagnosis present

## 2019-02-23 DIAGNOSIS — A4151 Sepsis due to Escherichia coli [E. coli]: Secondary | ICD-10-CM | POA: Diagnosis present

## 2019-02-23 DIAGNOSIS — I330 Acute and subacute infective endocarditis: Secondary | ICD-10-CM | POA: Diagnosis not present

## 2019-02-23 DIAGNOSIS — E43 Unspecified severe protein-calorie malnutrition: Secondary | ICD-10-CM | POA: Diagnosis present

## 2019-02-23 DIAGNOSIS — S83104A Unspecified dislocation of right knee, initial encounter: Secondary | ICD-10-CM | POA: Diagnosis not present

## 2019-02-23 DIAGNOSIS — J9690 Respiratory failure, unspecified, unspecified whether with hypoxia or hypercapnia: Secondary | ICD-10-CM | POA: Diagnosis not present

## 2019-02-23 DIAGNOSIS — Y831 Surgical operation with implant of artificial internal device as the cause of abnormal reaction of the patient, or of later complication, without mention of misadventure at the time of the procedure: Secondary | ICD-10-CM | POA: Diagnosis not present

## 2019-02-23 DIAGNOSIS — B9620 Unspecified Escherichia coli [E. coli] as the cause of diseases classified elsewhere: Secondary | ICD-10-CM | POA: Diagnosis present

## 2019-02-23 DIAGNOSIS — L03115 Cellulitis of right lower limb: Secondary | ICD-10-CM | POA: Diagnosis not present

## 2019-02-23 DIAGNOSIS — R791 Abnormal coagulation profile: Secondary | ICD-10-CM | POA: Diagnosis present

## 2019-02-23 DIAGNOSIS — Z20828 Contact with and (suspected) exposure to other viral communicable diseases: Secondary | ICD-10-CM | POA: Diagnosis present

## 2019-02-23 DIAGNOSIS — E1152 Type 2 diabetes mellitus with diabetic peripheral angiopathy with gangrene: Secondary | ICD-10-CM | POA: Diagnosis present

## 2019-02-23 DIAGNOSIS — T8781 Dehiscence of amputation stump: Secondary | ICD-10-CM | POA: Diagnosis not present

## 2019-02-23 DIAGNOSIS — Z1611 Resistance to penicillins: Secondary | ICD-10-CM | POA: Diagnosis present

## 2019-02-23 DIAGNOSIS — E1122 Type 2 diabetes mellitus with diabetic chronic kidney disease: Secondary | ICD-10-CM | POA: Diagnosis present

## 2019-02-23 DIAGNOSIS — M869 Osteomyelitis, unspecified: Secondary | ICD-10-CM | POA: Diagnosis not present

## 2019-02-23 DIAGNOSIS — L02415 Cutaneous abscess of right lower limb: Secondary | ICD-10-CM | POA: Diagnosis present

## 2019-02-23 DIAGNOSIS — Y92239 Unspecified place in hospital as the place of occurrence of the external cause: Secondary | ICD-10-CM | POA: Diagnosis not present

## 2019-02-23 DIAGNOSIS — R159 Full incontinence of feces: Secondary | ICD-10-CM | POA: Diagnosis not present

## 2019-02-23 DIAGNOSIS — I340 Nonrheumatic mitral (valve) insufficiency: Secondary | ICD-10-CM | POA: Diagnosis not present

## 2019-02-23 DIAGNOSIS — T8451XA Infection and inflammatory reaction due to internal right hip prosthesis, initial encounter: Secondary | ICD-10-CM | POA: Diagnosis not present

## 2019-02-23 DIAGNOSIS — I480 Paroxysmal atrial fibrillation: Secondary | ICD-10-CM | POA: Diagnosis present

## 2019-02-23 DIAGNOSIS — I96 Gangrene, not elsewhere classified: Secondary | ICD-10-CM | POA: Diagnosis not present

## 2019-02-23 DIAGNOSIS — Y792 Prosthetic and other implants, materials and accessory orthopedic devices associated with adverse incidents: Secondary | ICD-10-CM | POA: Diagnosis present

## 2019-02-23 DIAGNOSIS — Z978 Presence of other specified devices: Secondary | ICD-10-CM | POA: Diagnosis not present

## 2019-02-23 DIAGNOSIS — E1169 Type 2 diabetes mellitus with other specified complication: Secondary | ICD-10-CM | POA: Diagnosis present

## 2019-02-23 DIAGNOSIS — T8130XA Disruption of wound, unspecified, initial encounter: Secondary | ICD-10-CM | POA: Diagnosis not present

## 2019-02-23 DIAGNOSIS — M879 Osteonecrosis, unspecified: Secondary | ICD-10-CM | POA: Diagnosis not present

## 2019-02-23 DIAGNOSIS — M86451 Chronic osteomyelitis with draining sinus, right femur: Secondary | ICD-10-CM | POA: Diagnosis present

## 2019-02-23 DIAGNOSIS — R571 Hypovolemic shock: Secondary | ICD-10-CM | POA: Diagnosis not present

## 2019-02-23 DIAGNOSIS — T84022A Instability of internal right knee prosthesis, initial encounter: Secondary | ICD-10-CM | POA: Diagnosis present

## 2019-02-23 LAB — BASIC METABOLIC PANEL
Anion gap: 7 (ref 5–15)
BUN: 10 mg/dL (ref 8–23)
CO2: 27 mmol/L (ref 22–32)
Calcium: 7.7 mg/dL — ABNORMAL LOW (ref 8.9–10.3)
Chloride: 103 mmol/L (ref 98–111)
Creatinine, Ser: 1.15 mg/dL — ABNORMAL HIGH (ref 0.44–1.00)
GFR calc Af Amer: 52 mL/min — ABNORMAL LOW (ref >60)
GFR calc non Af Amer: 45 mL/min — ABNORMAL LOW (ref >60)
Glucose, Bld: 114 mg/dL — ABNORMAL HIGH (ref 70–99)
Potassium: 4.2 mmol/L (ref 3.5–5.1)
Sodium: 137 mmol/L (ref 135–145)

## 2019-02-23 LAB — BLOOD CULTURE ID PANEL (REFLEXED)

## 2019-02-23 LAB — CBC
HCT: 20.2 % — ABNORMAL LOW (ref 36.0–46.0)
Hemoglobin: 6.7 g/dL — CL (ref 12.0–15.0)
MCH: 27.8 pg (ref 26.0–34.0)
MCHC: 33.2 g/dL (ref 30.0–36.0)
MCV: 83.8 fL (ref 80.0–100.0)
Platelets: 283 10*3/uL (ref 150–400)
RBC: 2.41 MIL/uL — ABNORMAL LOW (ref 3.87–5.11)
RDW: 16.3 % — ABNORMAL HIGH (ref 11.5–15.5)
WBC: 13.2 10*3/uL — ABNORMAL HIGH (ref 4.0–10.5)
nRBC: 2.6 % — ABNORMAL HIGH (ref 0.0–0.2)

## 2019-02-23 LAB — VITAMIN D 25 HYDROXY (VIT D DEFICIENCY, FRACTURES): Vit D, 25-Hydroxy: 8.85 ng/mL — ABNORMAL LOW (ref 30–100)

## 2019-02-23 LAB — HAPTOGLOBIN: Haptoglobin: 252 mg/dL (ref 42–346)

## 2019-02-23 LAB — PROTIME-INR
INR: 1.5 — ABNORMAL HIGH (ref 0.8–1.2)
Prothrombin Time: 17.8 seconds — ABNORMAL HIGH (ref 11.4–15.2)

## 2019-02-23 LAB — HEMOGLOBIN AND HEMATOCRIT, BLOOD
HCT: 24 % — ABNORMAL LOW (ref 36.0–46.0)
Hemoglobin: 7.9 g/dL — ABNORMAL LOW (ref 12.0–15.0)

## 2019-02-23 LAB — PREPARE RBC (CROSSMATCH)

## 2019-02-23 MED ORDER — WARFARIN SODIUM 5 MG PO TABS
5.0000 mg | ORAL_TABLET | Freq: Once | ORAL | Status: AC
  Administered 2019-02-23: 5 mg via ORAL
  Filled 2019-02-23: qty 1

## 2019-02-23 MED ORDER — VITAMIN D (ERGOCALCIFEROL) 1.25 MG (50000 UNIT) PO CAPS
50000.0000 [IU] | ORAL_CAPSULE | ORAL | Status: DC
  Filled 2019-02-23 (×2): qty 1

## 2019-02-23 MED ORDER — SODIUM CHLORIDE 0.9% IV SOLUTION: Freq: Once | INTRAVENOUS | Status: DC

## 2019-02-23 MED ORDER — SODIUM CHLORIDE 0.9 % IV SOLN
2.0000 g | Freq: Every day | INTRAVENOUS | Status: DC
  Administered 2019-02-23 – 2019-03-28 (×35): 2 g via INTRAVENOUS
  Filled 2019-02-23: qty 20
  Filled 2019-02-23: qty 2
  Filled 2019-02-23: qty 20
  Filled 2019-02-23: qty 2
  Filled 2019-02-23: qty 20
  Filled 2019-02-23: qty 2
  Filled 2019-02-23 (×4): qty 20
  Filled 2019-02-23 (×2): qty 2
  Filled 2019-02-23 (×2): qty 20
  Filled 2019-02-23: qty 2
  Filled 2019-02-23 (×3): qty 20
  Filled 2019-02-23: qty 2
  Filled 2019-02-23 (×5): qty 20
  Filled 2019-02-23: qty 2
  Filled 2019-02-23: qty 20
  Filled 2019-02-23: qty 2
  Filled 2019-02-23: qty 20
  Filled 2019-02-23 (×2): qty 2
  Filled 2019-02-23: qty 20
  Filled 2019-02-23 (×2): qty 2
  Filled 2019-02-23 (×5): qty 20

## 2019-02-23 MED ORDER — VITAMIN D 25 MCG (1000 UNIT) PO TABS
2000.0000 [IU] | ORAL_TABLET | Freq: Two times a day (BID) | ORAL | Status: DC
  Administered 2019-02-23 – 2019-03-01 (×12): 2000 [IU] via ORAL
  Filled 2019-02-23 (×13): qty 2

## 2019-02-23 NOTE — Progress Notes (Signed)
PHARMACY - PHYSICIAN COMMUNICATION CRITICAL VALUE ALERT - BLOOD CULTURE IDENTIFICATION (BCID)  Robin Snow is an 79 y.o. female who presented to [facility] on 02/22/2019 with a chief complaint of right knee pain, s/p recent admission three weeks ago for E. Coli bacteremia.  Assessment:  1/2 blood cultures growing E. Coli.    Name of physician (or Provider) Contacted: Tylene Fantasia  Current antibiotics: None  Changes to prescribed antibiotics recommended:   Knee aspiration performed yesterday afternoon, so will start Rocephin 2 g IV q24h.  Results for orders placed or performed during the hospital encounter of 02/22/19  Blood Culture ID Panel (Reflexed) (Collected: 02/22/2019  8:15 AM)  Result Value Ref Range   Enterococcus species NOT DETECTED NOT DETECTED   Listeria monocytogenes NOT DETECTED NOT DETECTED   Staphylococcus species NOT DETECTED NOT DETECTED   Staphylococcus aureus (BCID) NOT DETECTED NOT DETECTED   Streptococcus species NOT DETECTED NOT DETECTED   Streptococcus agalactiae NOT DETECTED NOT DETECTED   Streptococcus pneumoniae NOT DETECTED NOT DETECTED   Streptococcus pyogenes NOT DETECTED NOT DETECTED   Acinetobacter baumannii NOT DETECTED NOT DETECTED   Enterobacteriaceae species DETECTED (A) NOT DETECTED   Enterobacter cloacae complex NOT DETECTED NOT DETECTED   Escherichia coli DETECTED (A) NOT DETECTED   Klebsiella oxytoca NOT DETECTED NOT DETECTED   Klebsiella pneumoniae NOT DETECTED NOT DETECTED   Proteus species NOT DETECTED NOT DETECTED   Serratia marcescens NOT DETECTED NOT DETECTED   Carbapenem resistance NOT DETECTED NOT DETECTED   Haemophilus influenzae NOT DETECTED NOT DETECTED   Neisseria meningitidis NOT DETECTED NOT DETECTED   Pseudomonas aeruginosa NOT DETECTED NOT DETECTED   Candida albicans NOT DETECTED NOT DETECTED   Candida glabrata NOT DETECTED NOT DETECTED   Candida krusei NOT DETECTED NOT DETECTED   Candida parapsilosis NOT DETECTED NOT  DETECTED   Candida tropicalis NOT DETECTED NOT DETECTED    Caryl Pina 02/23/2019  3:33 AM

## 2019-02-23 NOTE — Progress Notes (Signed)
1st unit of PRBCs started as ordered.  The patient was educated regarding blood transfusion reactions.  The patient verbalizes understanding.  Dr Sloan Leiter is at the bedside for physical assessment.  The patient's daughter is present.

## 2019-02-23 NOTE — Progress Notes (Signed)
Orthopaedic Trauma Progress Note  S: Doing well this morning, pain much better than it was yesterday. Daughter at bedside. Culuture from knee aspiration showed gram negative rods, susceptibilities to follow. Patient Hgb 6.7 this AM, receiving 2 units PRBCs. Vitamin D level is 8, will start on Vitamin D3 and D2 supplementation  O:  Vitals:   02/23/19 0941 02/23/19 0942  BP: 114/63 114/63  Pulse:  60  Resp:    Temp:    SpO2:      General - laying in bed, NAD  Right Lower Extremity - Dressing to leg is clean, dry, intact. Knee immobilizer in place. Able to wiggle toes. Chronic skin changes noted over foot. Toes warm and well perfused.   Imaging: Stable post op imaging.  Labs:  Results for orders placed or performed during the hospital encounter of 02/22/19 (from the past 24 hour(s))  Lactic acid, plasma     Status: Abnormal   Collection Time: 02/22/19 10:08 AM  Result Value Ref Range   Lactic Acid, Venous 2.9 (HH) 0.5 - 1.9 mmol/L  Lactic acid, plasma     Status: Abnormal   Collection Time: 02/22/19 12:30 PM  Result Value Ref Range   Lactic Acid, Venous 3.0 (HH) 0.5 - 1.9 mmol/L  Hemoglobin and hematocrit, blood     Status: Abnormal   Collection Time: 02/22/19 12:30 PM  Result Value Ref Range   Hemoglobin 7.1 (L) 12.0 - 15.0 g/dL   HCT 36.6 (L) 29.4 - 76.5 %  Type and screen [facility]     Status: None (Preliminary result)   Collection Time: 02/22/19 12:31 PM  Result Value Ref Range   ABO/RH(D) A POS    Antibody Screen POS    Sample Expiration 02/25/2019,2359    DAT, IgG NEG    Antibody Identification ANTI LEA (Lewis a) ANTI LEB (Lewis b)    PT AG Type      NEGATIVE FOR LEWIS B ANTIGEN NEGATIVE FOR E ANTIGEN   Unit Number [id number]    Blood Component Type RED CELLS,LR    Unit division 00    Status of Unit ISSUED,FINAL    Donor AG Type      NEGATIVE FOR E ANTIGEN NEGATIVE FOR LEWIS B ANTIGEN NEGATIVE FOR LEWIS A ANTIGEN   Transfusion Status OK TO  TRANSFUSE    Crossmatch Result COMPATIBLE    Unit Number [id number]    Blood Component Type RED CELLS,LR    Unit division 00    Status of Unit REL FROM [facility]    Transfusion Status DO NOT ISSUE FOR TRANSFUSION    Crossmatch Result INCOMPATIBLE    Unit Number [id number]    Blood Component Type RED CELLS,LR    Unit division 00    Status of Unit ALLOCATED    Donor AG Type      NEGATIVE FOR E ANTIGEN NEGATIVE FOR LEWIS A ANTIGEN   Transfusion Status OK TO TRANSFUSE    Crossmatch Result COMPATIBLE    Unit Number [id number]    Blood Component Type RED CELLS,LR    Unit division 00    Status of Unit ALLOCATED    Donor AG Type      NEGATIVE FOR E ANTIGEN NEGATIVE FOR LEWIS A ANTIGEN   Transfusion Status OK TO TRANSFUSE    Crossmatch Result COMPATIBLE   Prepare RBC     Status: None   Collection Time: 02/22/19  1:09 PM  Result Value Ref Range   Order Confirmation  ORDER PROCESSED BY BLOOD BANK Performed at [facility], [address] [place], [place] [address]   Aerobic/Anaerobic Culture (surgical/deep wound)     Status: None (Preliminary result)   Collection Time: 02/22/19  2:01 PM   Specimen: Synovial, Right Knee; Body Fluid  Result Value Ref Range   Specimen Description SYNOVIAL RIGHT KNEE    Special Requests NONE    Gram Stain      ABUNDANT WBC PRESENT, PREDOMINANTLY PMN FEW GRAM POSITIVE RODS RARE GRAM NEGATIVE RODS RARE GRAM VARIABLE ROD    Culture      MODERATE GRAM NEGATIVE RODS IDENTIFICATION AND SUSCEPTIBILITIES TO FOLLOW Performed at [facility], [address] [place], [place] [address]    Report Status PENDING   Glucose, capillary     Status: Abnormal   Collection Time: 02/22/19  2:28 PM  Result Value Ref Range   Glucose-Capillary 135 (H) 70 - 99 mg/dL   Comment 1 Notify RN   Reticulocytes     Status: Abnormal   Collection Time: 02/22/19  6:23 PM  Result Value Ref Range   Retic Ct Pct 2.8 0.4 - 3.1 %   RBC. 2.51 (L) 3.87 - 5.11  MIL/uL   Retic Count, Absolute 69.5 19.0 - 186.0 K/uL   Immature Retic Fract 41.0 (H) 2.3 - 15.9 %  Haptoglobin     Status: None   Collection Time: 02/22/19  6:23 PM  Result Value Ref Range   Haptoglobin 252 42 - 346 mg/dL  Lactate dehydrogenase     Status: Abnormal   Collection Time: 02/22/19  6:23 PM  Result Value Ref Range   LDH 204 (H) 98 - 192 U/L  Save Smear     Status: None   Collection Time: 02/22/19  6:23 PM  Result Value Ref Range   Smear Review SMEAR STAINED AND AVAILABLE FOR REVIEW   Protime-INR     Status: Abnormal   Collection Time: 02/23/19  4:46 AM  Result Value Ref Range   Prothrombin Time 17.8 (H) 11.4 - 15.2 seconds   INR 1.5 (H) 0.8 - 1.2  CBC     Status: Abnormal   Collection Time: 02/23/19  4:46 AM  Result Value Ref Range   WBC 13.2 (H) 4.0 - 10.5 K/uL   RBC 2.41 (L) 3.87 - 5.11 MIL/uL   Hemoglobin 6.7 (LL) 12.0 - 15.0 g/dL   HCT 09.820.2 (L) 11.936.0 - 14.746.0 %   MCV 83.8 80.0 - 100.0 fL   MCH 27.8 26.0 - 34.0 pg   MCHC 33.2 30.0 - 36.0 g/dL   RDW 82.916.3 (H) 56.211.5 - 13.015.5 %   Platelets 283 150 - 400 K/uL   nRBC 2.6 (H) 0.0 - 0.2 %  Basic metabolic panel     Status: Abnormal   Collection Time: 02/23/19  4:46 AM  Result Value Ref Range   Sodium 137 135 - 145 mmol/L   Potassium 4.2 3.5 - 5.1 mmol/L   Chloride 103 98 - 111 mmol/L   CO2 27 22 - 32 mmol/L   Glucose, Bld 114 (H) 70 - 99 mg/dL   BUN 10 8 - 23 mg/dL   Creatinine, Ser 8.651.15 (H) 0.44 - 1.00 mg/dL   Calcium 7.7 (L) 8.9 - 10.3 mg/dL   GFR calc non Af Amer 45 (L) >60 mL/min   GFR calc Af Amer 52 (L) >60 mL/min   Anion gap 7 5 - 15  VITAMIN D 25 Hydroxy (Vit-D Deficiency, Fractures)  Status: Abnormal   Collection Time: 02/23/19  4:46 AM  Result Value Ref Range   Vit D, 25-Hydroxy 8.85 (L) 30 - 100 ng/mL  Prepare RBC     Status: None   Collection Time: 02/23/19  6:27 AM  Result Value Ref Range   Order Confirmation      ORDER PROCESSED BY BLOOD BANK Performed at [facility], [place] [address] [place], [place] [address]     Assessment: 78 year old female with history of right total knee arthroplasty presenting with spontaneous right knee dislocation  Injuries: Right knee dislocation s/p closed reduction Possible chronic right knee infection s/p asipration  Weightbearing: NWB RLE. Okay to transfer from bed to chair  Insicional and dressing care: change dressing as needed, ace wrap to assist with swelling  Orthopedic device(s): Knee immobilizer   CV/Blood loss: Acute blood loss anemia, Hgb 6.7 this morning, receiving 2 units PRBCs.   Pain management: per primary team  VTE prophylaxis: per primary team  ID: Ceftriaxone per primary team  Foley/Lines: No foley, continue IVFs  Medical co-morbidities: paroxysmal atrial fibrillation on anticoagulation Coumadin, hypertension, diabetes mellitus type II, and chronic kidney disease stage III.   Dispo: Continue care per primary team.  Correction of INR.  No role for surgical intervention at this time.  Okay for discharge from ortho standpoint once cleared by medicine team and therapies  Follow - up plan: 2 weeks  Contact information:  Katha Hamming MD, Patrecia Pace PA-C   [facility] [phone] (office) [email or web address]

## 2019-02-23 NOTE — Progress Notes (Signed)
PROGRESS NOTE    Robin Snow  FOY:[id number] DOB: 1940/12/30 DOA: 02/22/2019 PCP: [name], MD    Brief Narrative:  78 year old female with history of paroxysmal A. fib on anticoagulation with Coumadin, hypertension, type 2 diabetes, stage III chronic kidney disease, prosthetic right knee with revision arthroplasty and 2018, recent admission for E. coli bacteremia presented to the emergency room with complaints of worsening right knee pain and swelling.  Patient hospitalized at [facility] from 10/19-11/14 with sepsis related to E. coli bacteria, UTI and bacterial arthritis history of the right knee.  She was treated with 2 weeks of antibiotics.  Right knee was evaluated and thought to be with no evidence of infection.  In the emergency room, patient was afebrile.  Tachycardic.  Blood pressure is stable.  WBC count 14.9.  Hemoglobin 8.1.  INR 7.3.  Swollen right knee.  X-ray showed large effusion and anterior dislocation.  Admitted with orthopedic consultation.   Assessment & Plan:   Principal Problem:   Sepsis ([place]) Active Problems:   Diabetes mellitus with complication (HCC)   Anemia   Supratherapeutic INR   Sinus pause: 4.02-4.80sec per telemetry 09/08/2016   Closed dislocation of right knee   Effusion of right knee joint   AF (paroxysmal atrial fibrillation) ([place])   Bacteremia due to Escherichia coli   Iron deficiency anemia due to chronic blood loss  Sepsis present on admission, E. coli bacteremia, chronic infected right knee with prosthetic dislocation: Clinically stabilizing.  Sepsis resolving.  Continue Rocephin, repeat blood cultures tomorrow morning.  Urine cultures are pending. Underwent closed reduction and aspiration of the right knee, gram-negative bacteria on aspirate, no frank purulence.  Orthopedics anticipating conservative management.  If she grows E. coli in her prosthetic knee, she will probably need revision/removal of prosthesis.  Defer to  orthopedics.  Acute on chronic anemia, anemia of chronic disease: Aggravated by recent procedures and hospitalizations.  Hemoglobin was less than 7.  Transfused 1 unit of PRBC today with consent.  We will recheck levels tomorrow morning.  Paroxysmal A. fib: Currently sinus rhythm.  Rate controlled.  Patient on Cardizem and metoprolol.  INR was supratherapeutic, apparently using higher doses at home, reversed per surgery.  Redose Coumadin by pharmacy.  Sick sinus syndrome status post pacemaker: Is stable.  CKD stage IIIa: Fairly stable.  Creatinine at about baseline.  Type 2 diabetes: Diet controlled at home.  Remains on sliding scale in the hospital.   DVT prophylaxis: Coumadin Code Status: Full code Family Communication: Daughter at bedside Disposition Plan: Unknown.  Remains active treatment. Patient has infected prosthesis, E. coli bacteremia and anemia.  She is anticipated to continue to need close medical care, transfusions, antibiotics.  Anticipate hospitalization more than 2 midnights.   Consultants:   Orthopedic surgery  Procedures:   Close reduction right knee, aspiration right knee  Antimicrobials:   Rocephin, 02/23/2019---   Subjective: Patient seen and examined.  Still has right knee pain, however with immobilizer better than yesterday.  Daughter at the bedside.  No other overnight events.  Objective: Vitals:   02/23/19 1015 02/23/19 1044 02/23/19 1055 02/23/19 1447  BP: 117/67  119/72 (!) 92/59  Pulse: 60 60 60 (!) 59  Resp: 18 16 18 16   Temp: 98.2 F (36.8 C) 98.2 F (36.8 C) 98.2 F (36.8 C)   TempSrc: Oral Oral Oral   SpO2: 99% 99% 98% 94%    Intake/Output Summary (Last 24 hours) at 02/23/2019 1500 Last data filed at  02/23/2019 1341 Gross per 24 hour  Intake 913 ml  Output --  Net 913 ml   There were no vitals filed for this visit.  Examination:  General exam: Appears calm and comfortable, on room air.  Not in any distress. Respiratory  system: Clear to auscultation. Respiratory effort normal. Cardiovascular system: S1 & S2 heard, RRR. No JVD, murmurs, rubs, gallops or clicks.  Left precordial pacemaker present.  Nontender. Gastrointestinal system: Abdomen is nondistended, soft and nontender. No organomegaly or masses felt. Normal bowel sounds heard. Central nervous system: Alert and oriented. No focal neurological deficits. Extremities: Symmetric 5 x 5 power.  Right knee is in immobilizer.  Distal neurovascular status intact.  She has a dry unstageable ulcer on the right heel. Skin: No rashes, lesions or ulcers Psychiatry: Judgement and insight appear normal. Mood & affect appropriate.     Data Reviewed: I have personally reviewed following labs and imaging studies  CBC: Recent Labs  Lab 02/22/19 0814 02/22/19 1230 02/23/19 0446  WBC 14.9*  --  13.2*  NEUTROABS 10.3*  --   --   HGB 8.1* 7.1* 6.7*  HCT 25.6* 22.1* 20.2*  MCV 86.8  --  83.8  PLT 364  --  283   Basic Metabolic Panel: Recent Labs  Lab 02/22/19 0814 02/23/19 0446  NA 136 137  K 3.9 4.2  CL 100 103  CO2 22 27  GLUCOSE 135* 114*  BUN 9 10  CREATININE 1.09* 1.15*  CALCIUM 8.2* 7.7*   GFR: CrCl cannot be calculated (Unknown ideal weight.). Liver Function Tests: No results for input(s): AST, ALT, ALKPHOS, BILITOT, PROT, ALBUMIN in the last 168 hours. No results for input(s): LIPASE, AMYLASE in the last 168 hours. No results for input(s): AMMONIA in the last 168 hours. Coagulation Profile: Recent Labs  Lab 02/22/19 0814 02/23/19 0446  INR 7.3* 1.5*   Cardiac Enzymes: No results for input(s): CKTOTAL, CKMB, CKMBINDEX, TROPONINI in the last 168 hours. BNP (last 3 results) No results for input(s): PROBNP in the last 8760 hours. HbA1C: No results for input(s): HGBA1C in the last 72 hours. CBG: Recent Labs  Lab 02/22/19 1428  GLUCAP 135*   Lipid Profile: No results for input(s): CHOL, HDL, LDLCALC, TRIG, CHOLHDL, LDLDIRECT in the  last 72 hours. Thyroid Function Tests: No results for input(s): TSH, T4TOTAL, FREET4, T3FREE, THYROIDAB in the last 72 hours. Anemia Panel: Recent Labs    02/22/19 1823  RETICCTPCT 2.8   Sepsis Labs: Recent Labs  Lab 02/22/19 1008 02/22/19 1230  LATICACIDVEN 2.9* 3.0*    Recent Results (from the past 240 hour(s))  Blood culture (routine x 2)     Status: None (Preliminary result)   Collection Time: 02/22/19  7:59 AM   Specimen: BLOOD  Result Value Ref Range Status   Specimen Description BLOOD RIGHT ANTECUBITAL  Final   Special Requests   Final    BOTTLES DRAWN AEROBIC ONLY Blood Culture results may not be optimal due to an inadequate volume of blood received in culture bottles   Culture   Final    NO GROWTH 1 DAY Performed at [facility], [address] [place], [place] [address]    Report Status PENDING  Incomplete  Blood culture (routine x 2)     Status: None (Preliminary result)   Collection Time: 02/22/19  8:15 AM   Specimen: BLOOD  Result Value Ref Range Status   Specimen Description BLOOD LEFT ANTECUBITAL  Final   Special Requests  Final    BOTTLES DRAWN AEROBIC AND ANAEROBIC Blood Culture results may not be optimal due to an inadequate volume of blood received in culture bottles   Culture  Setup Time   Final    GRAM NEGATIVE RODS AEROBIC BOTTLE ONLY CRITICAL RESULT CALLED TO, READ BACK BY AND VERIFIED WITH: Lieutenant Diego [id number] 02/23/2019 Girtha Hake Performed at [facility], [address], [place], [place] [address]    Culture GRAM NEGATIVE RODS  Final   Report Status PENDING  Incomplete  Blood Culture ID Panel (Reflexed)     Status: Abnormal   Collection Time: 02/22/19  8:15 AM  Result Value Ref Range Status   Enterococcus species NOT DETECTED NOT DETECTED Final   Listeria monocytogenes NOT DETECTED NOT DETECTED Final   Staphylococcus species NOT DETECTED NOT DETECTED Final   Staphylococcus aureus (BCID) NOT DETECTED NOT DETECTED Final    Streptococcus species NOT DETECTED NOT DETECTED Final   Streptococcus agalactiae NOT DETECTED NOT DETECTED Final   Streptococcus pneumoniae NOT DETECTED NOT DETECTED Final   Streptococcus pyogenes NOT DETECTED NOT DETECTED Final   Acinetobacter baumannii NOT DETECTED NOT DETECTED Final   Enterobacteriaceae species DETECTED (A) NOT DETECTED Final    Comment: Enterobacteriaceae represent a large family of gram-negative bacteria, not a single organism. CRITICAL RESULT CALLED TO, READ BACK BY AND VERIFIED WITH: G. ABBOTT,PHARMD 0328 02/23/2019 T. TYSOR    Enterobacter cloacae complex NOT DETECTED NOT DETECTED Final   Escherichia coli DETECTED (A) NOT DETECTED Final    Comment: CRITICAL RESULT CALLED TO, READ BACK BY AND VERIFIED WITH: G. ABBOTT,PHARMD 0328 02/23/2019 T. TYSOR    Klebsiella oxytoca NOT DETECTED NOT DETECTED Final   Klebsiella pneumoniae NOT DETECTED NOT DETECTED Final   Proteus species NOT DETECTED NOT DETECTED Final   Serratia marcescens NOT DETECTED NOT DETECTED Final   Carbapenem resistance NOT DETECTED NOT DETECTED Final   Haemophilus influenzae NOT DETECTED NOT DETECTED Final   Neisseria meningitidis NOT DETECTED NOT DETECTED Final   Pseudomonas aeruginosa NOT DETECTED NOT DETECTED Final   Candida albicans NOT DETECTED NOT DETECTED Final   Candida glabrata NOT DETECTED NOT DETECTED Final   Candida krusei NOT DETECTED NOT DETECTED Final   Candida parapsilosis NOT DETECTED NOT DETECTED Final   Candida tropicalis NOT DETECTED NOT DETECTED Final    Comment: Performed at [facility], [address], [place], [place] [address]  SARS Coronavirus 2 by RT PCR (hospital order, performed in [facility] hospital lab) Nasopharyngeal Nasopharyngeal Swab     Status: None   Collection Time: 02/22/19  9:43 AM   Specimen: Nasopharyngeal Swab  Result Value Ref Range Status   SARS Coronavirus 2 NEGATIVE NEGATIVE Final    Comment: (NOTE) If result is NEGATIVE SARS-CoV-2 target  nucleic acids are NOT DETECTED. The SARS-CoV-2 RNA is generally detectable in upper and lower  respiratory specimens during the acute phase of infection. The lowest  concentration of SARS-CoV-2 viral copies this assay can detect is 250  copies / mL. A negative result does not preclude SARS-CoV-2 infection  and should not be used as the sole basis for treatment or other  patient management decisions.  A negative result may occur with  improper specimen collection / handling, submission of specimen other  than nasopharyngeal swab, presence of viral mutation(s) within the  areas targeted by this assay, and inadequate number of viral copies  (<250 copies / mL). A negative result must be combined with clinical  observations,  patient history, and epidemiological information. If result is POSITIVE SARS-CoV-2 target nucleic acids are DETECTED. The SARS-CoV-2 RNA is generally detectable in upper and lower  respiratory specimens dur ing the acute phase of infection.  Positive  results are indicative of active infection with SARS-CoV-2.  Clinical  correlation with patient history and other diagnostic information is  necessary to determine patient infection status.  Positive results do  not rule out bacterial infection or co-infection with other viruses. If result is PRESUMPTIVE POSTIVE SARS-CoV-2 nucleic acids MAY BE PRESENT.   A presumptive positive result was obtained on the submitted specimen  and confirmed on repeat testing.  While 2019 novel coronavirus  (SARS-CoV-2) nucleic acids may be present in the submitted sample  additional confirmatory testing may be necessary for epidemiological  and / or clinical management purposes  to differentiate between  SARS-CoV-2 and other Sarbecovirus currently known to infect humans.  If clinically indicated additional testing with an alternate test  methodology [phone]([id number]) is advised. The SARS-CoV-2 RNA is generally  detectable in upper and lower  respiratory sp ecimens during the acute  phase of infection. The expected result is Negative. Fact Sheet for Patients:  BoilerBrush.com.cyhttps://www.fda.gov/media/136312/download Fact Sheet for Healthcare Providers: https://pope.com/https://www.fda.gov/media/136313/download This test is not yet approved or cleared by the [place] FDA and has been authorized for detection and/or diagnosis of SARS-CoV-2 by FDA under an Emergency Use Authorization (EUA).  This EUA will remain in effect (meaning this test can be used) for the duration of the COVID-19 declaration under Section 564(b)(1) of the Act, 21 U.S.C. section 360bbb-3(b)(1), unless the authorization is terminated or revoked sooner. Performed at [facility], [address] [place], [place] [address]   Aerobic/Anaerobic Culture (surgical/deep wound)     Status: None (Preliminary result)   Collection Time: 02/22/19  2:01 PM   Specimen: Synovial, Right Knee; Body Fluid  Result Value Ref Range Status   Specimen Description SYNOVIAL RIGHT KNEE  Final   Special Requests NONE  Final   Gram Stain   Final    ABUNDANT WBC PRESENT, PREDOMINANTLY PMN FEW GRAM POSITIVE RODS RARE GRAM NEGATIVE RODS RARE GRAM VARIABLE ROD    Culture   Final    MODERATE ESCHERICHIA COLI SUSCEPTIBILITIES TO FOLLOW Performed at [facility], [address] [place], [place] [address]    Report Status PENDING  Incomplete         Radiology Studies: Dg Knee Complete 4 Views Right  Result Date: 02/22/2019 CLINICAL DATA:  Increasing knee pain, initial encounter EXAM: RIGHT KNEE - COMPLETE 4+ VIEW COMPARISON:  02/02/2019 FINDINGS: The tibial component of the knee prosthesis has been dislocated anteriorly with respect to the femoral component. Considerable joint effusion is noted new from the prior exam. Lucency is noted along the lateral aspect of the tibial component somewhat suspicious for loosening. This is stable from the prior exam. Significant remodeling of the patella  is noted. IMPRESSION: New large joint effusion. Anterior dislocation of the femoral component with respect to the tibial component. Stable lucency is noted in the periprosthetic regions suspicious for loosening. Critical Value/emergent results were called by telephone at the time of interpretation on 02/22/2019 at 8:10 am to [facility] MORELL[facility], PA , who verbally acknowledged these results. Electronically Signed   By: Alcide CleverMark  Lukens M.D.   On: 02/22/2019 08:11   Right Knee  Result Date: 02/22/2019 CLINICAL DATA:  Right knee dislocation status post intraoperative reduction EXAM: PORTABLE RIGHT KNEE - 1-2 VIEW COMPARISON:  Right  knee radiographs from earlier today FINDINGS: Right knee total arthroplasty. Successful interval reduction of previously visualized right knee dislocation. No evidence of hardware fracture or loosening. There is an acute appearing nondisplaced periprosthetic osseous fracture of anterior distal right femoral metaphysis with large right knee joint effusion. No additional osseous fractures identified. No suspicious focal osseous lesions. Diffuse soft tissue swelling. IMPRESSION: 1. Successful interval reduction of previously visualized right knee dislocation. Right total knee arthroplasty with no evidence of hardware fracture or loosening. 2. Acute appearing nondisplaced periprosthetic osseous fracture of the anterior distal right femoral metaphysis. 3. Large right knee joint effusion. Electronically Signed   By: Delbert Phenix M.D.   On: 02/22/2019 17:40   Dg C-arm 1-60 Min  Result Date: 02/22/2019 CLINICAL DATA:  Closed manipulation of right knee dislocation. EXAM: RIGHT KNEE - 2 VIEW; DG C-ARM 1-60 MIN COMPARISON:  Radiographs dated 02/22/2019 at 7:31 a.m. and 02/02/2019 FINDINGS: AP and lateral C-arm images demonstrate the dislocation of the knee prosthesis has been reduced. The C-arm images are not adequate for evaluation of acute fractures. There is a large joint effusion. IMPRESSION:  Reduction of the dislocation of the knee prosthesis. Large joint effusion. C-arm fluoroscopic images were obtained intraoperatively and submitted for post operative interpretation. Please see the performing provider's procedural report for the fluoroscopy time utilized. Electronically Signed   By: Francene Boyers M.D.   On: 02/22/2019 15:12   Dg Knee 2 Views Right  Result Date: 02/22/2019 CLINICAL DATA:  Closed manipulation of right knee dislocation. EXAM: RIGHT KNEE - 2 VIEW; DG C-ARM 1-60 MIN COMPARISON:  Radiographs dated 02/22/2019 at 7:31 a.m. and 02/02/2019 FINDINGS: AP and lateral C-arm images demonstrate the dislocation of the knee prosthesis has been reduced. The C-arm images are not adequate for evaluation of acute fractures. There is a large joint effusion. IMPRESSION: Reduction of the dislocation of the knee prosthesis. Large joint effusion. C-arm fluoroscopic images were obtained intraoperatively and submitted for post operative interpretation. Please see the performing provider's procedural report for the fluoroscopy time utilized. Electronically Signed   By: Francene Boyers M.D.   On: 02/22/2019 15:12        Scheduled Meds:  sodium chloride   Intravenous Once   sodium chloride   Intravenous Once   cholecalciferol  2,000 Units Oral BID   diltiazem  120 mg Oral Daily   ferrous sulfate  325 mg Oral BID   gabapentin  300 mg Oral TID   metoprolol tartrate  25 mg Oral BID   pravastatin  20 mg Oral q1800   sodium chloride flush  3 mL Intravenous Q12H   Vitamin D (Ergocalciferol)  50,000 Units Oral Q7 days   warfarin  5 mg Oral Once   Warfarin - Pharmacist Dosing Inpatient   Does not apply q1800   Continuous Infusions:  cefTRIAXone (ROCEPHIN)  IV 2 g (02/23/19 5409)     LOS: 0 days    Time spent: 35 minutes    Dorcas Carrow, MD [facility] Pager [phone]

## 2019-02-23 NOTE — Progress Notes (Signed)
ANTICOAGULATION CONSULT NOTE - Follow Up Consult  Pharmacy Consult for warfarin Indication: atrial fibrillation  Allergies  Allergen Reactions  . Aspirin Hives and Swelling    Angioedema   . Rofecoxib Hives  . Hydrocodone Hives    Tolerates oxycodone and tramadol    Vital Signs: Temp: 98.2 F (36.8 C) (11/19 1055) Temp Source: Oral (11/19 1055) BP: 119/72 (11/19 1055) Pulse Rate: 60 (11/19 1055)  Labs: Recent Labs    02/22/19 0814 02/22/19 1230 02/23/19 0446  HGB 8.1* 7.1* 6.7*  HCT 25.6* 22.1* 20.2*  PLT 364  --  283  LABPROT 62.9*  --  17.8*  INR 7.3*  --  1.5*  CREATININE 1.09*  --  1.15*    CrCl cannot be calculated (Unknown ideal weight.).  Assessment: [name] on warfarin PTA for atrial fibrillation. On recent admission, pt was discharged 11/04 on warfarin 7.5 mg on Wednesdays and 5 mg on other days. However, pt's daughter reports she was taking 10mg  Wednesdays, Saturdays, and Sundays and 7.5mg  all other days.  INR was elevated at 7.3 on admission. Warfarin was held, and Orthopedics ordered IV Vitamin K 5 mg x1 for reversal in ED. Pt is s/p knee aspiration 11/18.  INR this morning is 1.5, subtherapeutic. Hgb this morning 6.7, transfused with 2 units PRBC. Pltc WNL. No bleeding noted.  Goal of Therapy:  INR 2-3 Monitor platelets by anticoagulation protocol: Yes   Plan:  Give warfarin 5 mg x1 tonight Daily INR Monitor s/sx of bleeding  Robin Snow 02/23/2019,12:49 PM

## 2019-02-23 NOTE — Progress Notes (Signed)
Dr Sloan Leiter was sent text message via [place].  The tele monitor tech called and the patient was noted to have 130-140's heart rate.  The patient has history of A-fib. The increased heart rate continued for approximate 5-7 minutes.  The patient denied having any chest pain, shortness of breath at the time.  The patient's heart rate currently is 100 irregular.  Awaiting for call back.

## 2019-02-23 NOTE — Progress Notes (Signed)
Provider contacted with Hemoglobin 6.7. Awaiting orders.

## 2019-02-24 ENCOUNTER — Other Ambulatory Visit: Payer: Self-pay

## 2019-02-24 LAB — BASIC METABOLIC PANEL WITH GFR
Anion gap: 10 (ref 5–15)
BUN: 15 mg/dL (ref 8–23)
CO2: 24 mmol/L (ref 22–32)
Calcium: 7.9 mg/dL — ABNORMAL LOW (ref 8.9–10.3)
Chloride: 101 mmol/L (ref 98–111)
Creatinine, Ser: 1.14 mg/dL — ABNORMAL HIGH (ref 0.44–1.00)
GFR calc Af Amer: 53 mL/min — ABNORMAL LOW
GFR calc non Af Amer: 46 mL/min — ABNORMAL LOW
Glucose, Bld: 104 mg/dL — ABNORMAL HIGH (ref 70–99)
Potassium: 4.3 mmol/L (ref 3.5–5.1)
Sodium: 135 mmol/L (ref 135–145)

## 2019-02-24 LAB — CBC WITH DIFFERENTIAL/PLATELET
Abs Immature Granulocytes: 1.04 K/uL — ABNORMAL HIGH (ref 0.00–0.07)
Basophils Absolute: 0.1 K/uL (ref 0.0–0.1)
Basophils Relative: 0 %
Eosinophils Absolute: 0 K/uL (ref 0.0–0.5)
Eosinophils Relative: 0 %
HCT: 22.8 % — ABNORMAL LOW (ref 36.0–46.0)
Hemoglobin: 7.6 g/dL — ABNORMAL LOW (ref 12.0–15.0)
Immature Granulocytes: 6 %
Lymphocytes Relative: 15 %
Lymphs Abs: 2.7 K/uL (ref 0.7–4.0)
MCH: 27.9 pg (ref 26.0–34.0)
MCHC: 33.3 g/dL (ref 30.0–36.0)
MCV: 83.8 fL (ref 80.0–100.0)
Monocytes Absolute: 2.2 K/uL — ABNORMAL HIGH (ref 0.1–1.0)
Monocytes Relative: 12 %
Neutro Abs: 12.3 K/uL — ABNORMAL HIGH (ref 1.7–7.7)
Neutrophils Relative %: 67 %
Platelets: 233 K/uL (ref 150–400)
RBC: 2.72 MIL/uL — ABNORMAL LOW (ref 3.87–5.11)
RDW: 16.6 % — ABNORMAL HIGH (ref 11.5–15.5)
WBC: 18.3 K/uL — ABNORMAL HIGH (ref 4.0–10.5)
nRBC: 2.4 % — ABNORMAL HIGH (ref 0.0–0.2)

## 2019-02-24 LAB — PHOSPHORUS: Phosphorus: 2.3 mg/dL — ABNORMAL LOW (ref 2.5–4.6)

## 2019-02-24 LAB — PROTIME-INR
INR: 1.3 — ABNORMAL HIGH (ref 0.8–1.2)
Prothrombin Time: 15.9 s — ABNORMAL HIGH (ref 11.4–15.2)

## 2019-02-24 LAB — MAGNESIUM: Magnesium: 1.8 mg/dL (ref 1.7–2.4)

## 2019-02-24 MED ORDER — WARFARIN SODIUM 7.5 MG PO TABS
7.5000 mg | ORAL_TABLET | Freq: Once | ORAL | Status: AC
  Administered 2019-02-24: 7.5 mg via ORAL
  Filled 2019-02-24: qty 1

## 2019-02-24 NOTE — Evaluation (Signed)
Occupational Therapy Evaluation Patient Details Name: Robin Snow MRN: [id number] DOB: 1940-09-24 Today's Date: 02/24/2019    History of Present Illness Robin Snow is a 78 y.o. female with medical history significant of paroxysmal atrial fibrillation on anticoagulation Coumadin, hypertension, diabetes mellitus type II, and chronic kidney disease stage III.  She presents with complaints right knee pain and swelling. Hospitalized from 10/29-11/4 with sepsis related to E. coli bacteremia, UTI, and bacterial arthritis right knee. Found to have dislocated right knee prothestic--02/22/19 Closed reduction of right knee prosthetic dislocation   Clinical Impression   This 53 female admitted with above presents to acute OT with decreased mobility, increased pain with movement, NWB'ing RLE, use of KI for RLE now, and generalized weakness. PTA per pt was only doing stand pivot transfers not ambulating and could do most of her own bathing/dressing but needed A with toileting. Pt now needs increased A with all. She will benefit from acute OT with follow up at SNF (unles family feel they can manage her at current increased A level at home).    Follow Up Recommendations  SNF;Supervision/Assistance - 24 hour;Other (comment)(unless family feel they can manage her with increased level of care)    Equipment Recommendations  Other (comment)(hoyer lift)       Precautions / Restrictions Precautions Precautions: Fall Required Braces or Orthoses: Knee Immobilizer - Right Knee Immobilizer - Right: On at all times Restrictions Weight Bearing Restrictions: Yes RLE Weight Bearing: Non weight bearing      Mobility Bed Mobility Overal bed mobility: Needs Assistance Bed Mobility: Sit to Supine Rolling: Max assist(to right, total A to left)   Supine to sit: Total assist;+2 for physical assistance Sit to supine: Total assist;+2 for physical assistance   General bed mobility comments: supine to sit:  HOB up , having pt move her legs to left side of bed, had her reach with LUE to right upper bed rail, then use bed pad as well as supported her RLE as we pivoted her around to sit EOB with use of bed pad and propped her RLE on top of trashcan turned sideways     Balance Overall balance assessment: Needs assistance Sitting-balance support: Single extremity supported;Feet supported Sitting balance-Leahy Scale: Poor         Standing balance comment: unable to try standing safely with patient at this time due to NWB'ing status of RLE and total A +2 (pt did A but not more than 25%)to even get to EOB                           ADL either performed or assessed with clinical judgement   ADL Overall ADL's : Needs assistance/impaired Eating/Feeding: Set up;Sitting   Grooming: Set up;Sitting   Upper Body Bathing: Minimal assistance;Sitting   Lower Body Bathing: Bed level;Total assistance   Upper Body Dressing : Minimal assistance;Sitting   Lower Body Dressing: Total assistance;Bed level                       Vision Patient Visual Report: No change from baseline              Pertinent Vitals/Pain Pain Assessment: Faces Faces Pain Scale: Hurts whole lot Pain Location: R knee Pain Descriptors / Indicators: Grimacing;Guarding;Sore;Moaning Pain Intervention(s): Limited activity within patient's tolerance;Monitored during session;Repositioned     Hand Dominance Right   Extremity/Trunk Assessment Upper Extremity Assessment Upper Extremity Assessment: Generalized weakness(decreased AROM  at left shoulder (pt reports this was since pacemaker))           Communication Communication Communication: No difficulties   Cognition Arousal/Alertness: Awake/alert Behavior During Therapy: WFL for tasks assessed/performed Overall Cognitive Status: Within Functional Limits for tasks assessed                                                Home Living  Family/patient expects to be discharged to:: Private residence Living Arrangements: Children Available Help at Discharge: Family;Available 24 hours/day Type of Home: House Home Access: Stairs to enter [employer] of Steps: 3 Entrance Stairs-Rails: None Home Layout: Two level;Able to live on main level with bedroom/bathroom     Bathroom Shower/Tub: Teacher, early years/pre: Standard     Home Equipment: Environmental consultant - 2 wheels;Bedside commode;Shower seat;Wheelchair - manual;Hospital bed   Additional Comments: Pt reports she got a new wheelchair with elevating leg rests last admission      Prior Functioning/Environment Level of Independence: Needs assistance  Gait / Transfers Assistance Needed: Was only doing stand pivot transfers since last admission ADL's / Homemaking Assistance Needed: Reports she could partially bath and dress herself, but needed alot of A with toileting            OT Problem List: Decreased strength;Decreased range of motion;Decreased activity tolerance;Impaired balance (sitting and/or standing);Obesity;Pain      OT Treatment/Interventions: Self-care/ADL training;DME and/or AE instruction;Patient/family education;Balance training;Therapeutic exercise    OT Goals(Current goals can be found in the care plan section) Acute Rehab OT Goals Patient Stated Goal: for knee to get better OT Goal Formulation: With patient Time For Goal Achievement: 03/09/19 Potential to Achieve Goals: Good  OT Frequency: Min 2X/week           Co-evaluation PT/OT/SLP Co-Evaluation/Treatment: Yes Reason for Co-Treatment: For patient/therapist safety PT goals addressed during session: Mobility/safety with mobility;Balance;Strengthening/ROM OT goals addressed during session: Strengthening/ROM      AM-PAC OT "6 Clicks" Daily Activity     Outcome Measure Help from another person eating meals?: None Help from another person taking care of personal grooming?: A  Little Help from another person toileting, which includes using toliet, bedpan, or urinal?: Total Help from another person bathing (including washing, rinsing, drying)?: A Lot Help from another person to put on and taking off regular upper body clothing?: A Little Help from another person to put on and taking off regular lower body clothing?: Total 6 Click Score: 14   End of Session Nurse Communication: (pt needed to be cleaned up')  Activity Tolerance: Patient limited by pain Patient left: in bed;with nursing/sitter in room  OT Visit Diagnosis: Other abnormalities of gait and mobility (R26.89);Muscle weakness (generalized) (M62.81);Pain Pain - Right/Left: Right Pain - part of body: Knee                Time: 1017-5102 OT Time Calculation (min): 33 min Charges:  OT General Charges $OT Visit: 1 Visit OT Evaluation $OT Eval Moderate Complexity: 1 Mod  [address], OTR/L Acute [employer] Pager [phone] Office [phone]     Almon Register 02/24/2019, 2:18 PM

## 2019-02-24 NOTE — Progress Notes (Signed)
PROGRESS NOTE    Robin Snow  VQX:[id number] DOB: 04-29-1940 DOA: 02/22/2019 PCP: [name], MD    Brief Narrative:  78 year old female with history of paroxysmal A. fib on anticoagulation with Coumadin, hypertension, type 2 diabetes, stage III chronic kidney disease, prosthetic right knee with revision arthroplasty and 2018, recent admission for E. coli bacteremia presented to the emergency room with complaints of worsening right knee pain and swelling.  Patient hospitalized at [facility] from 10/19-11/14 with sepsis related to E. coli bacteria, UTI and bacterial arthritis history of the right knee.  She was treated with 2 weeks of antibiotics.  Right knee was evaluated and thought to be with no evidence of infection.  In the emergency room, patient was afebrile.  Tachycardic.  Blood pressure is stable.  WBC count 14.9.  Hemoglobin 8.1.  INR 7.3.  Swollen right knee.  X-ray showed large effusion and anterior dislocation.  Admitted with orthopedic consultation.   Assessment & Plan:   Principal Problem:   Sepsis (HCC) Active Problems:   Diabetes mellitus with complication (HCC)   Anemia   Supratherapeutic INR   Sinus pause: 4.02-4.80sec per telemetry 09/08/2016   Closed dislocation of right knee   Effusion of right knee joint   AF (paroxysmal atrial fibrillation) (HCC)   Bacteremia due to Escherichia coli   Iron deficiency anemia due to chronic blood loss  Sepsis present on admission, E. coli bacteremia, chronic infected right knee with dislocated prosthetic knee: Clinically stabilizing.  Sepsis resolving.  Continue Rocephin, repeat blood cultures collected today. Urine cultures are pending. Underwent closed reduction and aspiration of the right knee, E Coli on aspirate, no frank purulence.  Orthopedics anticipating conservative management.  If she grows E. coli in her prosthetic knee, she will probably need revision/removal of prosthesis.  Defer to orthopedics.  If conservative management of change, patient may need long-term suppressive antibiotics.  In that case, will discuss with ID. Advance activities.  We will consult PT OT today.  Acute on chronic anemia, anemia of chronic disease: Aggravated by recent procedures and hospitalizations.  Hemoglobin was less than 7.  Transfused 1 unit of PRBC with appropriate response.  We will recheck levels tomorrow morning.  Paroxysmal A. fib: Intermittently with A. fib.  rate controlled.  Patient on Cardizem and metoprolol.  INR was supratherapeutic, apparently using higher doses at home, reversed per surgery.  Redose Coumadin by pharmacy.  Sick sinus syndrome status post pacemaker: Is stable.  CKD stage IIIa: Fairly stable.  Creatinine at about baseline.  Type 2 diabetes: Diet controlled at home.  Remains on sliding scale in the hospital.   DVT prophylaxis: Coumadin Code Status: Full code Family Communication: None today Disposition Plan: Unknown.  Remains active treatment. Patient has infected prosthesis, E. coli bacteremia and anemia.  She is anticipated to continue to need close medical care, transfusions, antibiotics.    Consultants:   Orthopedic surgery  Procedures:   Close reduction right knee, aspiration right knee  Antimicrobials:   Rocephin, 02/23/2019---   Subjective: Seen and examined.  No overnight events.  Still has some pain, however she has not walked on the right foot yet.  Remains afebrile.  Objective: Vitals:   02/23/19 2136 02/24/19 0441 02/24/19 0751 02/24/19 1030  BP: (!) 107/57 114/63 104/63   Pulse: (!) 59 62 (!) 58 64  Resp: 20 18 17    Temp: 98.4 F (36.9 C) 98.4 F (36.9 C) 98.6 F (37 C)   TempSrc: Oral Oral Oral  SpO2: 99% 100% 97%     Intake/Output Summary (Last 24 hours) at 02/24/2019 1146 Last data filed at 02/24/2019 1033 Gross per 24 hour  Intake 837.05 ml  Output 1250 ml  Net -412.95 ml   There were no vitals filed for this visit.   Examination:  General exam: Appears calm and comfortable, on room air.  Not in any distress. Respiratory system: Clear to auscultation. Respiratory effort normal. Cardiovascular system: S1 & S2 heard, RRR. No JVD, murmurs, rubs, gallops or clicks.  Left precordial pacemaker present.  Nontender. Gastrointestinal system: Abdomen is nondistended, soft and nontender. No organomegaly or masses felt. Normal bowel sounds heard. Central nervous system: Alert and oriented. No focal neurological deficits. Extremities: Symmetric 5 x 5 power.  Right knee is in immobilizer and compression dressing, I did not remove it..  Distal neurovascular status intact.  She has a dry unstageable ulcer on the right heel. Skin: No rashes, lesions or ulcers Psychiatry: Judgement and insight appear normal. Mood & affect appropriate.     Data Reviewed: I have personally reviewed following labs and imaging studies  CBC: Recent Labs  Lab 02/22/19 0814 02/22/19 1230 02/23/19 0446 02/23/19 1526 02/24/19 0003  WBC 14.9*  --  13.2*  --  18.3*  NEUTROABS 10.3*  --   --   --  12.3*  HGB 8.1* 7.1* 6.7* 7.9* 7.6*  HCT 25.6* 22.1* 20.2* 24.0* 22.8*  MCV 86.8  --  83.8  --  83.8  PLT 364  --  283  --  233   Basic Metabolic Panel: Recent Labs  Lab 02/22/19 0814 02/23/19 0446 02/24/19 0003  NA 136 137 135  K 3.9 4.2 4.3  CL 100 103 101  CO2 22 27 24   GLUCOSE 135* 114* 104*  BUN 9 10 15   CREATININE 1.09* 1.15* 1.14*  CALCIUM 8.2* 7.7* 7.9*  MG  --   --  1.8  PHOS  --   --  2.3*   GFR: CrCl cannot be calculated (Unknown ideal weight.). Liver Function Tests: No results for input(s): AST, ALT, ALKPHOS, BILITOT, PROT, ALBUMIN in the last 168 hours. No results for input(s): LIPASE, AMYLASE in the last 168 hours. No results for input(s): AMMONIA in the last 168 hours. Coagulation Profile: Recent Labs  Lab 02/22/19 0814 02/23/19 0446 02/24/19 0003  INR 7.3* 1.5* 1.3*   Cardiac Enzymes: No results for  input(s): CKTOTAL, CKMB, CKMBINDEX, TROPONINI in the last 168 hours. BNP (last 3 results) No results for input(s): PROBNP in the last 8760 hours. HbA1C: No results for input(s): HGBA1C in the last 72 hours. CBG: Recent Labs  Lab 02/22/19 1428  GLUCAP 135*   Lipid Profile: No results for input(s): CHOL, HDL, LDLCALC, TRIG, CHOLHDL, LDLDIRECT in the last 72 hours. Thyroid Function Tests: No results for input(s): TSH, T4TOTAL, FREET4, T3FREE, THYROIDAB in the last 72 hours. Anemia Panel: Recent Labs    02/22/19 1823  RETICCTPCT 2.8   Sepsis Labs: Recent Labs  Lab 02/22/19 1008 02/22/19 1230  LATICACIDVEN 2.9* 3.0*    Recent Results (from the past 240 hour(s))  Blood culture (routine x 2)     Status: None (Preliminary result)   Collection Time: 02/22/19  7:59 AM   Specimen: BLOOD  Result Value Ref Range Status   Specimen Description BLOOD RIGHT ANTECUBITAL  Final   Special Requests   Final    BOTTLES DRAWN AEROBIC ONLY Blood Culture results may not be optimal due to an inadequate volume of blood  received in culture bottles   Culture   Final    NO GROWTH 2 DAYS Performed at [facility], [place] [address], [place], [place] [address]    Report Status PENDING  Incomplete  Blood culture (routine x 2)     Status: Abnormal (Preliminary result)   Collection Time: 02/22/19  8:15 AM   Specimen: BLOOD  Result Value Ref Range Status   Specimen Description BLOOD LEFT ANTECUBITAL  Final   Special Requests   Final    BOTTLES DRAWN AEROBIC AND ANAEROBIC Blood Culture results may not be optimal due to an inadequate volume of blood received in culture bottles   Culture  Setup Time   Final    GRAM NEGATIVE RODS AEROBIC BOTTLE ONLY CRITICAL RESULT CALLED TO, READ BACK BY AND VERIFIED WITH: G. ABBOTT,PHARMD [id number] 02/23/2019 T. TYSOR    Culture (A)  Final    ESCHERICHIA COLI SUSCEPTIBILITIES TO FOLLOW Performed at [facility], [place] [address] [place], [place] [address]     Report Status PENDING  Incomplete  Blood Culture ID Panel (Reflexed)     Status: Abnormal   Collection Time: 02/22/19  8:15 AM  Result Value Ref Range Status   Enterococcus species NOT DETECTED NOT DETECTED Final   Listeria monocytogenes NOT DETECTED NOT DETECTED Final   Staphylococcus species NOT DETECTED NOT DETECTED Final   Staphylococcus aureus (BCID) NOT DETECTED NOT DETECTED Final   Streptococcus species NOT DETECTED NOT DETECTED Final   Streptococcus agalactiae NOT DETECTED NOT DETECTED Final   Streptococcus pneumoniae NOT DETECTED NOT DETECTED Final   Streptococcus pyogenes NOT DETECTED NOT DETECTED Final   Acinetobacter baumannii NOT DETECTED NOT DETECTED Final   Enterobacteriaceae species DETECTED (A) NOT DETECTED Final    Comment: Enterobacteriaceae represent a large family of gram-negative bacteria, not a single organism. CRITICAL RESULT CALLED TO, READ BACK BY AND VERIFIED WITH: G. ABBOTT,PHARMD [id number] 02/23/2019 T. TYSOR    Enterobacter cloacae complex NOT DETECTED NOT DETECTED Final   Escherichia coli DETECTED (A) NOT DETECTED Final    Comment: CRITICAL RESULT CALLED TO, READ BACK BY AND VERIFIED WITH: G. ABBOTT,PHARMD [id number] 02/23/2019 T. TYSOR    Klebsiella oxytoca NOT DETECTED NOT DETECTED Final   Klebsiella pneumoniae NOT DETECTED NOT DETECTED Final   Proteus species NOT DETECTED NOT DETECTED Final   Serratia marcescens NOT DETECTED NOT DETECTED Final   Carbapenem resistance NOT DETECTED NOT DETECTED Final   Haemophilus influenzae NOT DETECTED NOT DETECTED Final   Neisseria meningitidis NOT DETECTED NOT DETECTED Final   Pseudomonas aeruginosa NOT DETECTED NOT DETECTED Final   Candida albicans NOT DETECTED NOT DETECTED Final   Candida glabrata NOT DETECTED NOT DETECTED Final   Candida krusei NOT DETECTED NOT DETECTED Final   Candida parapsilosis NOT DETECTED NOT DETECTED Final   Candida tropicalis NOT DETECTED NOT DETECTED Final    Comment: Performed at [facility], [place]. [address] [place], [place] [address]  SARS Coronavirus 2 by RT PCR (hospital order, performed in [facility] hospital lab) Nasopharyngeal Nasopharyngeal Swab     Status: None   Collection Time: 02/22/19  9:43 AM   Specimen: Nasopharyngeal Swab  Result Value Ref Range Status   SARS Coronavirus 2 NEGATIVE NEGATIVE Final    Comment: (NOTE) If result is NEGATIVE SARS-CoV-2 target nucleic acids are NOT DETECTED. The SARS-CoV-2 RNA is generally detectable in upper and lower  respiratory specimens during the acute phase of infection. The lowest  concentration of SARS-CoV-2  viral copies this assay can detect is 250  copies / mL. A negative result does not preclude SARS-CoV-2 infection  and should not be used as the sole basis for treatment or other  patient management decisions.  A negative result may occur with  improper specimen collection / handling, submission of specimen other  than nasopharyngeal swab, presence of viral mutation(s) within the  areas targeted by this assay, and inadequate number of viral copies  (<250 copies / mL). A negative result must be combined with clinical  observations, patient history, and epidemiological information. If result is POSITIVE SARS-CoV-2 target nucleic acids are DETECTED. The SARS-CoV-2 RNA is generally detectable in upper and lower  respiratory specimens dur ing the acute phase of infection.  Positive  results are indicative of active infection with SARS-CoV-2.  Clinical  correlation with patient history and other diagnostic information is  necessary to determine patient infection status.  Positive results do  not rule out bacterial infection or co-infection with other viruses. If result is PRESUMPTIVE POSTIVE SARS-CoV-2 nucleic acids MAY BE PRESENT.   A presumptive positive result was obtained on the submitted specimen  and confirmed on repeat testing.  While 2019 novel coronavirus  (SARS-CoV-2) nucleic acids may be present in the  submitted sample  additional confirmatory testing may be necessary for epidemiological  and / or clinical management purposes  to differentiate between  SARS-CoV-2 and other Sarbecovirus currently known to infect humans.  If clinically indicated additional testing with an alternate test  methodology [phone]) is advised. The SARS-CoV-2 RNA is generally  detectable in upper and lower respiratory sp ecimens during the acute  phase of infection. The expected result is Negative. Fact Sheet for Patients:  BoilerBrush.com.cy Fact Sheet for Healthcare Providers: https://pope.com/ This test is not yet approved or cleared by the [place] FDA and has been authorized for detection and/or diagnosis of SARS-CoV-2 by FDA under an Emergency Use Authorization (EUA).  This EUA will remain in effect (meaning this test can be used) for the duration of the COVID-19 declaration under Section 564(b)(1) of the Act, 21 U.S.C. section 360bbb-3(b)(1), unless the authorization is terminated or revoked sooner. Performed at [facility], [address], [place], [place] [address]   Aerobic/Anaerobic Culture (surgical/deep wound)     Status: None (Preliminary result)   Collection Time: 02/22/19  2:01 PM   Specimen: Synovial, Right Knee; Body Fluid  Result Value Ref Range Status   Specimen Description SYNOVIAL RIGHT KNEE  Final   Special Requests NONE  Final   Gram Stain   Final    ABUNDANT WBC PRESENT, PREDOMINANTLY PMN FEW GRAM POSITIVE RODS RARE GRAM NEGATIVE RODS RARE GRAM VARIABLE ROD    Culture   Final    MODERATE ESCHERICHIA COLI SUSCEPTIBILITIES TO FOLLOW Performed at [facility], [address], [place], [place] [address]    Report Status PENDING  Incomplete  Culture, blood (routine x 2)     Status: None (Preliminary result)   Collection Time: 02/23/19 11:57 PM   Specimen: BLOOD  Result Value Ref Range Status   Specimen  Description BLOOD LEFT ANTECUBITAL  Final   Special Requests   Final    BOTTLES DRAWN AEROBIC AND ANAEROBIC Blood Culture results may not be optimal due to an inadequate volume of blood received in culture bottles   Culture   Final    NO GROWTH < 12 HOURS Performed at [facility], [address] [place], [place] [address]  Report Status PENDING  Incomplete  Culture, blood (routine x 2)     Status: None (Preliminary result)   Collection Time: 02/24/19 12:03 AM   Specimen: BLOOD LEFT WRIST  Result Value Ref Range Status   Specimen Description BLOOD LEFT WRIST  Final   Special Requests   Final    BOTTLES DRAWN AEROBIC ONLY Blood Culture results may not be optimal due to an inadequate volume of blood received in culture bottles   Culture   Final    NO GROWTH < 12 HOURS Performed at [facility], [address] [place], [place] [address]    Report Status PENDING  Incomplete         Radiology Studies: Right Knee  Result Date: 02/22/2019 CLINICAL DATA:  Right knee dislocation status post intraoperative reduction EXAM: PORTABLE RIGHT KNEE - 1-2 VIEW COMPARISON:  Right knee radiographs from earlier today FINDINGS: Right knee total arthroplasty. Successful interval reduction of previously visualized right knee dislocation. No evidence of hardware fracture or loosening. There is an acute appearing nondisplaced periprosthetic osseous fracture of anterior distal right femoral metaphysis with large right knee joint effusion. No additional osseous fractures identified. No suspicious focal osseous lesions. Diffuse soft tissue swelling. IMPRESSION: 1. Successful interval reduction of previously visualized right knee dislocation. Right total knee arthroplasty with no evidence of hardware fracture or loosening. 2. Acute appearing nondisplaced periprosthetic osseous fracture of the anterior distal right femoral metaphysis. 3. Large right knee joint effusion. Electronically Signed   By:  Delbert Phenix M.D.   On: 02/22/2019 17:40   Dg C-arm 1-60 Min  Result Date: 02/22/2019 CLINICAL DATA:  Closed manipulation of right knee dislocation. EXAM: RIGHT KNEE - 2 VIEW; DG C-ARM 1-60 MIN COMPARISON:  Radiographs dated 02/22/2019 at 7:31 a.m. and 02/02/2019 FINDINGS: AP and lateral C-arm images demonstrate the dislocation of the knee prosthesis has been reduced. The C-arm images are not adequate for evaluation of acute fractures. There is a large joint effusion. IMPRESSION: Reduction of the dislocation of the knee prosthesis. Large joint effusion. C-arm fluoroscopic images were obtained intraoperatively and submitted for post operative interpretation. Please see the performing provider's procedural report for the fluoroscopy time utilized. Electronically Signed   By: Francene Boyers M.D.   On: 02/22/2019 15:12   Dg Knee 2 Views Right  Result Date: 02/22/2019 CLINICAL DATA:  Closed manipulation of right knee dislocation. EXAM: RIGHT KNEE - 2 VIEW; DG C-ARM 1-60 MIN COMPARISON:  Radiographs dated 02/22/2019 at 7:31 a.m. and 02/02/2019 FINDINGS: AP and lateral C-arm images demonstrate the dislocation of the knee prosthesis has been reduced. The C-arm images are not adequate for evaluation of acute fractures. There is a large joint effusion. IMPRESSION: Reduction of the dislocation of the knee prosthesis. Large joint effusion. C-arm fluoroscopic images were obtained intraoperatively and submitted for post operative interpretation. Please see the performing provider's procedural report for the fluoroscopy time utilized. Electronically Signed   By: Francene Boyers M.D.   On: 02/22/2019 15:12        Scheduled Meds: . sodium chloride   Intravenous Once  . sodium chloride   Intravenous Once  . cholecalciferol  2,000 Units Oral BID  . diltiazem  120 mg Oral Daily  . ferrous sulfate  325 mg Oral BID  . gabapentin  300 mg Oral TID  . metoprolol tartrate  25 mg Oral BID  . pravastatin  20 mg Oral  q1800  . sodium chloride flush  3 mL Intravenous Q12H  .  Vitamin D (Ergocalciferol)  50,000 Units Oral Q7 days  . warfarin  7.5 mg Oral ONCE-1800  . Warfarin - Pharmacist Dosing Inpatient   Does not apply q1800   Continuous Infusions: . cefTRIAXone (ROCEPHIN)  IV Stopped (02/23/19 2218)     LOS: 1 day    Time spent: 25 minutes    Dorcas Carrow, MD [facility] Pager [phone]

## 2019-02-24 NOTE — Progress Notes (Signed)
ANTICOAGULATION CONSULT NOTE - Follow Up Consult  Pharmacy Consult for warfarin Indication: atrial fibrillation  Allergies  Allergen Reactions  . Aspirin Hives and Swelling    Angioedema   . Rofecoxib Hives  . Hydrocodone Hives    Tolerates oxycodone and tramadol    Vital Signs: Temp: 98.6 F (37 C) (11/20 0751) Temp Source: Oral (11/20 0751) BP: 104/63 (11/20 0751) Pulse Rate: 58 (11/20 0751)  Labs: Recent Labs    02/22/19 0814  02/23/19 0446 02/23/19 1526 02/24/19 0003  HGB 8.1*   < > 6.7* 7.9* 7.6*  HCT 25.6*   < > 20.2* 24.0* 22.8*  PLT 364  --  283  --  233  LABPROT 62.9*  --  17.8*  --  15.9*  INR 7.3*  --  1.5*  --  1.3*  CREATININE 1.09*  --  1.15*  --  1.14*   < > = values in this interval not displayed.    CrCl cannot be calculated (Unknown ideal weight.).  Assessment: 19 YOF on warfarin PTA for atrial fibrillation. On recent admission, pt was discharged 11/04 on warfarin 7.5 mg on Wednesdays and 5 mg on other days. However, pt's daughter reports she was taking 10mg  Wednesdays, Saturdays, and Sundays and 7.5mg  all other days.  INR was elevated at 7.3 this admission. Warfarin was held, and Orthopedics ordered IV Vitamin K 5 mg x1 for reversal in ED on 11/18. Pt is also s/p knee aspiration 11/18.  INR this morning is 1.3, subtherapeutic. Hgb this morning 7.6, s/p 1 unit PRBC transfusion yesterday. Pltc WNL. No bleeding noted.  Goal of Therapy:  INR 2-3 Monitor platelets by anticoagulation protocol: Yes   Plan:  Give warfarin 7.5 mg x1 tonight Daily INR Monitor s/sx of bleeding  Gaylan Gerold 02/24/2019,10:08 AM

## 2019-02-24 NOTE — Plan of Care (Signed)

## 2019-02-24 NOTE — Evaluation (Signed)
Physical Therapy Evaluation Patient Details Name: Robin Snow MRN: [id number] DOB: 11-16-1940 Today's Date: 02/24/2019   History of Present Illness  Robin Snow is a 78 y.o. female with medical history significant of paroxysmal A-fib on anticoagulation Coumadin, HTN, DM and CKD stage III.  She presents with right knee pain and swelling. Hospitalized from 10/29-11/4 with sepsis related to E. coli bacteremia, UTI, and bacterial arthritis right knee. Found to have dislocated right knee prothestic--02/22/19 s/p Closed reduction of right knee prosthetic dislocation  Clinical Impression  Patient presents with generalized weakness, pain, impaired balance and impaired mobility s/p above. Pt was mainly performing SPTs with assist PTA and doing most of her ADLs except needed help with toileting. Today, pt requires total A for bed mobility with increased time due to pain of RLE during movement. Will require more assist for mobility due to NWB status and need to wear KI at all times. Would benefit from SNF to maximize independence and mobility prior to return home unless family can provide this higher level of assist at home. If so, recommend hoyer lift for transfers. Will follow acutely.    Follow Up Recommendations SNF;Supervision for mobility/OOB;Supervision/Assistance - 24 hour    Equipment Recommendations  Other (comment)(hoyer lift it family takes pt home)    Recommendations for Other Services       Precautions / Restrictions Precautions Precautions: Fall Required Braces or Orthoses: Knee Immobilizer - Right Knee Immobilizer - Right: On at all times Restrictions Weight Bearing Restrictions: Yes RLE Weight Bearing: Non weight bearing      Mobility  Bed Mobility Overal bed mobility: Needs Assistance Bed Mobility: Sit to Supine;Supine to Sit Rolling: Max assist;Total assist(Max A roll right and total A to roll left)   Supine to sit: Total assist;+2 for physical assistance Sit to  supine: Total assist;+2 for physical assistance   General bed mobility comments: supine to sit: HOB up, having pt move her legs to left side of bed, had her reach with LUE to right upper bed rail, then use bed pad as well as supported her RLE as we pivoted her around to sit EOB with use of bed pad and propped her RLE on top of trash can turned sideways. Assist to bring LEs into bed and lower trunk to return to supine.  Transfers                 General transfer comment: Deferred.  Ambulation/Gait                Stairs            Wheelchair Mobility    Modified Rankin (Stroke Patients Only)       Balance Overall balance assessment: Needs assistance Sitting-balance support: Single extremity supported;Feet supported Sitting balance-Leahy Scale: Poor Sitting balance - Comments: Close min guard-S for safety sitting EOB with RLE propped on trash can for support; attempted to scoot along side bed with total A of 2 with minimal effort.       Standing balance comment: unable to try standing safely with patient at this time due to Delia status of RLE and total A +2 (pt did A but not more than 25%)to even get to EOB                             Pertinent Vitals/Pain Pain Assessment: Faces Faces Pain Scale: Hurts whole lot Pain Location: R knee esp with movement Pain  Descriptors / Indicators: Grimacing;Guarding;Sore;Moaning Pain Intervention(s): Repositioned;Monitored during session;Limited activity within patient's tolerance    Home Living Family/patient expects to be discharged to:: Private residence Living Arrangements: Children Available Help at Discharge: Family;Available 24 hours/day Type of Home: House Home Access: Stairs to enter Entrance Stairs-Rails: None Entrance Stairs-Number of Steps: 3 Home Layout: Two level;Able to live on main level with bedroom/bathroom Home Equipment: Dan HumphreysWalker - 2 wheels;Bedside commode;Shower seat;Wheelchair -  manual;Hospital bed Additional Comments: Pt reports she got a new wheelchair with elevating leg rests last admission    Prior Function Level of Independence: Needs assistance   Gait / Transfers Assistance Needed: Was only doing stand pivot transfers since last admission with help  ADL's / Homemaking Assistance Needed: Reports she could partially bath and dress herself, but needed alot of A with toileting        Hand Dominance   Dominant Hand: Right    Extremity/Trunk Assessment   Upper Extremity Assessment Upper Extremity Assessment: Defer to OT evaluation    Lower Extremity Assessment Lower Extremity Assessment: Generalized weakness RLE Deficits / Details: Limited active movement due to pain with KI donned, Limited ankle AROM       Communication   Communication: No difficulties  Cognition Arousal/Alertness: Awake/alert Behavior During Therapy: WFL for tasks assessed/performed Overall Cognitive Status: Within Functional Limits for tasks assessed                                 General Comments: alert and oriented, slow processing but [facility]       General Comments      Exercises General Exercises - Lower Extremity Ankle Circles/Pumps: Both;10 reps;Supine   Assessment/Plan    PT Assessment Patient needs continued PT services  PT Problem List Pain;Obesity;Decreased knowledge of use of DME;Decreased mobility;Decreased balance;Decreased activity tolerance;Decreased range of motion;Decreased strength;Decreased skin integrity       PT Treatment Interventions Patient/family education;Therapeutic exercise;Therapeutic activities;Functional mobility training;Stair training;Gait training;DME instruction;Wheelchair mobility training;Modalities    PT Goals (Current goals can be found in the Care Plan section)  Acute Rehab PT Goals Patient Stated Goal: for knee to get better PT Goal Formulation: With patient Time For Goal Achievement: 03/10/19 Potential  to Achieve Goals: Fair    Frequency Min 3X/week   Barriers to discharge Inaccessible home environment stairs to enter home    Co-evaluation PT/OT/SLP Co-Evaluation/Treatment: Yes Reason for Co-Treatment: For patient/therapist safety PT goals addressed during session: Mobility/safety with mobility;Strengthening/ROM;Balance OT goals addressed during session: Strengthening/ROM       AM-PAC PT "6 Clicks" Mobility  Outcome Measure Help needed turning from your back to your side while in a flat bed without using bedrails?: Total Help needed moving from lying on your back to sitting on the side of a flat bed without using bedrails?: Total Help needed moving to and from a bed to a chair (including a wheelchair)?: Total Help needed standing up from a chair using your arms (e.g., wheelchair or bedside chair)?: Total Help needed to walk in hospital room?: Total Help needed climbing 3-5 steps with a railing? : Total 6 Click Score: 6    End of Session   Activity Tolerance: Patient tolerated treatment well;Patient limited by pain Patient left: in bed;with call bell/phone within reach;with bed alarm set Nurse Communication: Mobility status;Other (comment)(Rn notified pt needs a new purewick and needs to be cleaned up, present in room) PT Visit Diagnosis: Pain;Difficulty in walking, not elsewhere classified (  R26.2);Other abnormalities of gait and mobility (R26.89);History of falling (Z91.81);Muscle weakness (generalized) (M62.81) Pain - Right/Left: Right Pain - part of body: Knee    Time: 3016-0109 PT Time Calculation (min) (ACUTE ONLY): 32 min   Charges:   PT Evaluation $PT Eval Moderate Complexity: 1 Mod          Vale Haven, PT, DPT Acute Rehabilitation Services Pager [phone] Office [phone]      Blake Divine A Lanier Ensign 02/24/2019, 3:40 PM

## 2019-02-24 NOTE — Plan of Care (Signed)
°  Problem: Clinical Measurements: °Goal: Will remain free from infection °Outcome: Progressing °  °Problem: Activity: °Goal: Risk for activity intolerance will decrease °Outcome: Progressing °  °Problem: Elimination: °Goal: Will not experience complications related to bowel motility °Outcome: Progressing °Goal: Will not experience complications related to urinary retention °Outcome: Progressing °  °Problem: Pain Managment: °Goal: General experience of comfort will improve °Outcome: Progressing °  °Problem: Safety: °Goal: Ability to remain free from injury will improve °Outcome: Progressing °  °Problem: Skin Integrity: °Goal: Risk for impaired skin integrity will decrease °Outcome: Progressing °  °

## 2019-02-25 LAB — CBC WITH DIFFERENTIAL/PLATELET
Abs Immature Granulocytes: 1.07 10*3/uL — ABNORMAL HIGH (ref 0.00–0.07)
Basophils Absolute: 0.1 10*3/uL (ref 0.0–0.1)
Basophils Relative: 0 %
Eosinophils Absolute: 0.1 10*3/uL (ref 0.0–0.5)
Eosinophils Relative: 0 %
HCT: 24.3 % — ABNORMAL LOW (ref 36.0–46.0)
Hemoglobin: 7.9 g/dL — ABNORMAL LOW (ref 12.0–15.0)
Immature Granulocytes: 7 %
Lymphocytes Relative: 14 %
Lymphs Abs: 2.1 10*3/uL (ref 0.7–4.0)
MCH: 27.8 pg (ref 26.0–34.0)
MCHC: 32.5 g/dL (ref 30.0–36.0)
MCV: 85.6 fL (ref 80.0–100.0)
Monocytes Absolute: 1.3 10*3/uL — ABNORMAL HIGH (ref 0.1–1.0)
Monocytes Relative: 9 %
Neutro Abs: 10.7 10*3/uL — ABNORMAL HIGH (ref 1.7–7.7)
Neutrophils Relative %: 70 %
Platelets: 258 10*3/uL (ref 150–400)
RBC: 2.84 MIL/uL — ABNORMAL LOW (ref 3.87–5.11)
RDW: 17.4 % — ABNORMAL HIGH (ref 11.5–15.5)
WBC: 15.3 10*3/uL — ABNORMAL HIGH (ref 4.0–10.5)
nRBC: 1.4 % — ABNORMAL HIGH (ref 0.0–0.2)

## 2019-02-25 LAB — BASIC METABOLIC PANEL
Anion gap: 11 (ref 5–15)
BUN: 15 mg/dL (ref 8–23)
CO2: 24 mmol/L (ref 22–32)
Calcium: 8.1 mg/dL — ABNORMAL LOW (ref 8.9–10.3)
Chloride: 100 mmol/L (ref 98–111)
Creatinine, Ser: 1.12 mg/dL — ABNORMAL HIGH (ref 0.44–1.00)
GFR calc Af Amer: 54 mL/min — ABNORMAL LOW (ref >60)
GFR calc non Af Amer: 47 mL/min — ABNORMAL LOW (ref >60)
Glucose, Bld: 101 mg/dL — ABNORMAL HIGH (ref 70–99)
Potassium: 4.3 mmol/L (ref 3.5–5.1)
Sodium: 135 mmol/L (ref 135–145)

## 2019-02-25 LAB — PROTIME-INR
INR: 1.7 — ABNORMAL HIGH (ref 0.8–1.2)
Prothrombin Time: 20 seconds — ABNORMAL HIGH (ref 11.4–15.2)

## 2019-02-25 LAB — CULTURE, BLOOD (ROUTINE X 2)

## 2019-02-25 MED ORDER — WARFARIN SODIUM 7.5 MG PO TABS
7.5000 mg | ORAL_TABLET | Freq: Once | ORAL | Status: AC
  Administered 2019-02-25: 7.5 mg via ORAL
  Filled 2019-02-25: qty 1

## 2019-02-25 NOTE — Plan of Care (Signed)

## 2019-02-25 NOTE — Progress Notes (Signed)
Updated patient's daughter via phone. All questions answered to her satisfaction. She will come see her in the morning.

## 2019-02-25 NOTE — Progress Notes (Signed)
PROGRESS NOTE  Robin Snow ZOX:[id number] DOB: 1941/04/02 DOA: 02/22/2019 PCP: [name], MD  HPI/Recap of past 53 hours: 78 year old female with history of paroxysmal A. fib on anticoagulation with Coumadin, hypertension, type 2 diabetes, stage III chronic kidney disease, prosthetic right knee with revision arthroplasty and 2018, recent admission for E. coli bacteremia presented to the emergency room with complaints of worsening right knee pain and swelling.  Patient hospitalized at [facility] from 10/19-11/14 with sepsis related to E. coli bacteria, UTI and bacterial arthritis history of the right knee.  She was treated with 2 weeks of antibiotics.  Right knee was evaluated and thought to be with no evidence of infection.  In the emergency room, patient was afebrile.  Tachycardic.  Blood pressure is stable.  WBC count 14.9.  Hemoglobin 8.1.  INR 7.3.  Swollen right knee.  X-ray showed large effusion and anterior dislocation.  Admitted with orthopedic consultation.  02/25/19: Patient was seen and examined at her bedside this morning.  She is somnolent but arousable to voices.  She has no new complaints.  Ongoing treatment for E. coli bacteremia.  Persistent leukocytosis but improving.  Assessment/Plan: Principal Problem:   Sepsis (HCC) Active Problems:   Diabetes mellitus with complication (HCC)   Anemia   Supratherapeutic INR   Sinus pause: 4.02-4.80sec per telemetry 09/08/2016   Closed dislocation of right knee   Effusion of right knee joint   AF (paroxysmal atrial fibrillation) (HCC)   Bacteremia due to Escherichia coli   Iron deficiency anemia due to chronic blood loss  Sepsis present on admission, E. coli bacteremia, chronic infected right knee with dislocated prosthetic knee: Sepsis physiology resolving WBC improving from 16,000-15,000 Sensitivities resistant to ampicillin, Unasyn and Bactrim Continue Rocephin Repeated blood cultures drawn on 02/24/2019  no growth x1 day Continue to follow cultures Underwent closed reduction and aspiration of the right knee, E Coli on aspirate, no frank purulence.  Orthopedics anticipating conservative management. Right knee synovial fluid positive for E. coli resistant to ampicillin, Unasyn and Bactrim. Continue Rocephin  She will probably need revision/removal of prosthesis.  Defer to orthopedics. If conservative management of change, patient may need long-term suppressive antibiotics.  In that case, will discuss with ID.  E. coli right knee septic arthritis Synovial fluid grew E. coli Management as per above  Acute on chronic anemia, anemia of chronic disease: Aggravated by recent procedures and hospitalizations.  Hemoglobin was less than 7.  Transfused 1 unit of PRBC with appropriate response.   Hemoglobin stable 7.9 on 02/25/2019  From 7.6 on 02/24/2019. No sign of overt bleeding  Paroxysmal A. fib: Intermittently with A. fib.  rate controlled.  Patient on Cardizem and metoprolol for rate control.    On Coumadin for CVA prevention.  INR was supratherapeutic, apparently using higher doses at home, reversed per surgery.  Redose Coumadin by pharmacy.  INR 1.7 on 02/25/2019.  Subtherapeutic INR On Coumadin INR 1.7 on 02/25/2019 Coumadin and INR managed by pharmacy  Sick sinus syndrome status post pacemaker:  Stable  CKD stage IIIa: Fairly stable.    Creatinine at baseline 1.1 with GFR 54.  Type 2 diabetes: Diet controlled at home.  Remains on sliding scale in the hospital.  Avoid hypoglycemia.  Hemoglobin A1c 5.8 on 02/03/2019.   DVT prophylaxis: Coumadin Code Status: Full code Family Communication: None today Disposition Plan:  Possible discharge in the next 48 to 72 hours pending orthopedic surgery signing off and patient hemodynamically stability. Consultants:  Orthopedic surgery  Procedures:   Close reduction right knee, aspiration right knee  Antimicrobials:   Rocephin,  02/23/2019---    Objective: Vitals:   02/24/19 1413 02/24/19 2018 02/25/19 0355 02/25/19 0806  BP: 110/63 129/73 113/62 119/72  Pulse: (!) 59 92 85 92  Resp: 14     Temp: 98.5 F (36.9 C) 98.3 F (36.8 C) 98.8 F (37.1 C) 97.7 F (36.5 C)  TempSrc: Oral Oral Oral Oral  SpO2: 97% 98% 97% 95%  Weight:      Height:        Intake/Output Summary (Last 24 hours) at 02/25/2019 1045 Last data filed at 02/25/2019 1015 Gross per 24 hour  Intake 223 ml  Output 800 ml  Net -577 ml   Filed Weights   02/24/19 1030  Weight: 107 kg    Exam:  . General: 78 y.o. year-old female well developed well nourished in no acute distress.  Somnolent but easily arousable to voices. . Cardiovascular: Regular rate and rhythm with no rubs or gallops.  No thyromegaly or JVD noted.   [name]. Respiratory: Clear to auscultation with no wheezes or rales. Good inspiratory effort. . Abdomen: Soft nontender nondistended with normal bowel sounds x4 quadrants. . Musculoskeletal: 1+ pitting edema in lower extremities bilaterally.  Right knee is wrapped with stabilizer.   [name]. Psychiatry: Unable to assess mood due to somnolence.   Data Reviewed: CBC: Recent Labs  Lab 02/22/19 0814 02/22/19 1230 02/23/19 0446 02/23/19 1526 02/24/19 0003 02/25/19 0514  WBC 14.9*  --  13.2*  --  18.3* 15.3*  NEUTROABS 10.3*  --   --   --  12.3* 10.7*  HGB 8.1* 7.1* 6.7* 7.9* 7.6* 7.9*  HCT 25.6* 22.1* 20.2* 24.0* 22.8* 24.3*  MCV 86.8  --  83.8  --  83.8 85.6  PLT 364  --  283  --  233 258   Basic Metabolic Panel: Recent Labs  Lab 02/22/19 0814 02/23/19 0446 02/24/19 0003 02/25/19 0514  NA 136 137 135 135  K 3.9 4.2 4.3 4.3  CL 100 103 101 100  CO2 22 27 24 24   GLUCOSE 135* 114* 104* 101*  BUN 9 10 15 15   CREATININE 1.09* 1.15* 1.14* 1.12*  CALCIUM 8.2* 7.7* 7.9* 8.1*  MG  --   --  1.8  --   PHOS  --   --  2.3*  --    GFR: Estimated Creatinine Clearance: 51.3 mL/min (A) (by C-G formula based on SCr of 1.12  mg/dL (H)). Liver Function Tests: No results for input(s): AST, ALT, ALKPHOS, BILITOT, PROT, ALBUMIN in the last 168 hours. No results for input(s): LIPASE, AMYLASE in the last 168 hours. No results for input(s): AMMONIA in the last 168 hours. Coagulation Profile: Recent Labs  Lab 02/22/19 0814 02/23/19 0446 02/24/19 0003 02/25/19 0514  INR 7.3* 1.5* 1.3* 1.7*   Cardiac Enzymes: No results for input(s): CKTOTAL, CKMB, CKMBINDEX, TROPONINI in the last 168 hours. BNP (last 3 results) No results for input(s): PROBNP in the last 8760 hours. HbA1C: No results for input(s): HGBA1C in the last 72 hours. CBG: Recent Labs  Lab 02/22/19 1428  GLUCAP 135*   Lipid Profile: No results for input(s): CHOL, HDL, LDLCALC, TRIG, CHOLHDL, LDLDIRECT in the last 72 hours. Thyroid Function Tests: No results for input(s): TSH, T4TOTAL, FREET4, T3FREE, THYROIDAB in the last 72 hours. Anemia Panel: Recent Labs    02/22/19 1823  RETICCTPCT 2.8   Urine analysis:    Component  Value Date/Time   COLORURINE YELLOW 02/02/2019 1923   APPEARANCEUR CLEAR 02/02/2019 1923   LABSPEC 1.013 02/02/2019 1923   PHURINE 5.0 02/02/2019 1923   GLUCOSEU NEGATIVE 02/02/2019 1923   GLUCOSEU NEGATIVE 09/06/2017 1200   HGBUR SMALL (A) 02/02/2019 1923   BILIRUBINUR NEGATIVE 02/02/2019 1923   BILIRUBINUR Neg 10/18/2017 1453   KETONESUR NEGATIVE 02/02/2019 1923   PROTEINUR NEGATIVE 02/02/2019 1923   UROBILINOGEN 0.2 10/18/2017 1453   UROBILINOGEN 1.0 09/06/2017 1200   NITRITE POSITIVE (A) 02/02/2019 1923   LEUKOCYTESUR LARGE (A) 02/02/2019 1923   Sepsis Labs: (procalcitonin:4,lacticidven:4)  ) Recent Results (from the past 240 hour(s))  Blood culture (routine x 2)     Status: None (Preliminary result)   Collection Time: 02/22/19  7:59 AM   Specimen: BLOOD  Result Value Ref Range Status   Specimen Description BLOOD RIGHT ANTECUBITAL  Final   Special Requests   Final    BOTTLES DRAWN AEROBIC ONLY  Blood Culture results may not be optimal due to an inadequate volume of blood received in culture bottles   Culture   Final    NO GROWTH 3 DAYS Performed at [facility], [address] [place], [place] [address]    Report Status PENDING  Incomplete  Blood culture (routine x 2)     Status: Abnormal   Collection Time: 02/22/19  8:15 AM   Specimen: BLOOD  Result Value Ref Range Status   Specimen Description BLOOD LEFT ANTECUBITAL  Final   Special Requests   Final    BOTTLES DRAWN AEROBIC AND ANAEROBIC Blood Culture results may not be optimal due to an inadequate volume of blood received in culture bottles   Culture  Setup Time   Final    GRAM NEGATIVE RODS AEROBIC BOTTLE ONLY CRITICAL RESULT CALLED TO, READ BACK BY AND VERIFIED WITH: Lieutenant Diego [id number] 02/23/2019 Girtha Hake Performed at [facility], [address], [place], [place] [address]    Culture ESCHERICHIA COLI (A)  Final   Report Status 02/25/2019 FINAL  Final   Organism ID, Bacteria ESCHERICHIA COLI  Final      Susceptibility   Escherichia coli - MIC*    AMPICILLIN >=32 RESISTANT Resistant     CEFAZOLIN <=4 SENSITIVE Sensitive     CEFEPIME <=1 SENSITIVE Sensitive     CEFTAZIDIME <=1 SENSITIVE Sensitive     CEFTRIAXONE <=1 SENSITIVE Sensitive     CIPROFLOXACIN <=0.25 SENSITIVE Sensitive     GENTAMICIN <=1 SENSITIVE Sensitive     IMIPENEM <=0.25 SENSITIVE Sensitive     TRIMETH/SULFA >=320 RESISTANT Resistant     AMPICILLIN/SULBACTAM >=32 RESISTANT Resistant     PIP/TAZO <=4 SENSITIVE Sensitive     Extended ESBL NEGATIVE Sensitive     * ESCHERICHIA COLI  Blood Culture ID Panel (Reflexed)     Status: Abnormal   Collection Time: 02/22/19  8:15 AM  Result Value Ref Range Status   Enterococcus species NOT DETECTED NOT DETECTED Final   Listeria monocytogenes NOT DETECTED NOT DETECTED Final   Staphylococcus species NOT DETECTED NOT DETECTED Final   Staphylococcus aureus (BCID) NOT DETECTED NOT DETECTED Final    Streptococcus species NOT DETECTED NOT DETECTED Final   Streptococcus agalactiae NOT DETECTED NOT DETECTED Final   Streptococcus pneumoniae NOT DETECTED NOT DETECTED Final   Streptococcus pyogenes NOT DETECTED NOT DETECTED Final   Acinetobacter baumannii NOT DETECTED NOT DETECTED Final   Enterobacteriaceae species DETECTED (A) NOT DETECTED Final    Comment: Enterobacteriaceae represent  a large family of gram-negative bacteria, not a single organism. CRITICAL RESULT CALLED TO, READ BACK BY AND VERIFIED WITH: G. ABBOTT,PHARMD 0328 02/23/2019 T. TYSOR    Enterobacter cloacae complex NOT DETECTED NOT DETECTED Final   Escherichia coli DETECTED (A) NOT DETECTED Final    Comment: CRITICAL RESULT CALLED TO, READ BACK BY AND VERIFIED WITH: G. ABBOTT,PHARMD 0328 02/23/2019 T. TYSOR    Klebsiella oxytoca NOT DETECTED NOT DETECTED Final   Klebsiella pneumoniae NOT DETECTED NOT DETECTED Final   Proteus species NOT DETECTED NOT DETECTED Final   Serratia marcescens NOT DETECTED NOT DETECTED Final   Carbapenem resistance NOT DETECTED NOT DETECTED Final   Haemophilus influenzae NOT DETECTED NOT DETECTED Final   Neisseria meningitidis NOT DETECTED NOT DETECTED Final   Pseudomonas aeruginosa NOT DETECTED NOT DETECTED Final   Candida albicans NOT DETECTED NOT DETECTED Final   Candida glabrata NOT DETECTED NOT DETECTED Final   Candida krusei NOT DETECTED NOT DETECTED Final   Candida parapsilosis NOT DETECTED NOT DETECTED Final   Candida tropicalis NOT DETECTED NOT DETECTED Final    Comment: Performed at [facility], [address] [place], [place] [address]  SARS Coronavirus 2 by RT PCR (hospital order, performed in [facility] hospital lab) Nasopharyngeal Nasopharyngeal Swab     Status: None   Collection Time: 02/22/19  9:43 AM   Specimen: Nasopharyngeal Swab  Result Value Ref Range Status   SARS Coronavirus 2 NEGATIVE NEGATIVE Final    Comment: (NOTE) If result is NEGATIVE SARS-CoV-2  target nucleic acids are NOT DETECTED. The SARS-CoV-2 RNA is generally detectable in upper and lower  respiratory specimens during the acute phase of infection. The lowest  concentration of SARS-CoV-2 viral copies this assay can detect is 250  copies / mL. A negative result does not preclude SARS-CoV-2 infection  and should not be used as the sole basis for treatment or other  patient management decisions.  A negative result may occur with  improper specimen collection / handling, submission of specimen other  than nasopharyngeal swab, presence of viral mutation(s) within the  areas targeted by this assay, and inadequate number of viral copies  (<250 copies / mL). A negative result must be combined with clinical  observations, patient history, and epidemiological information. If result is POSITIVE SARS-CoV-2 target nucleic acids are DETECTED. The SARS-CoV-2 RNA is generally detectable in upper and lower  respiratory specimens dur ing the acute phase of infection.  Positive  results are indicative of active infection with SARS-CoV-2.  Clinical  correlation with patient history and other diagnostic information is  necessary to determine patient infection status.  Positive results do  not rule out bacterial infection or co-infection with other viruses. If result is PRESUMPTIVE POSTIVE SARS-CoV-2 nucleic acids MAY BE PRESENT.   A presumptive positive result was obtained on the submitted specimen  and confirmed on repeat testing.  While 2019 novel coronavirus  (SARS-CoV-2) nucleic acids may be present in the submitted sample  additional confirmatory testing may be necessary for epidemiological  and / or clinical management purposes  to differentiate between  SARS-CoV-2 and other Sarbecovirus currently known to infect humans.  If clinically indicated additional testing with an alternate test  methodology [phone]) is advised. The SARS-CoV-2 RNA is generally  detectable in upper and lower  respiratory sp ecimens during the acute  phase of infection. The expected result is Negative. Fact Sheet for Patients:  BoilerBrush.com.cy Fact Sheet for Healthcare Providers: https://pope.com/ This test is not yet  approved or cleared by the [place] and has been authorized for detection and/or diagnosis of SARS-CoV-2 by FDA under an Emergency Use Authorization (EUA).  This EUA will remain in effect (meaning this test can be used) for the duration of the COVID-19 declaration under Section 564(b)(1) of the Act, 21 U.S.C. section 360bbb-3(b)(1), unless the authorization is terminated or revoked sooner. Performed at [facility], [place] [address] [place], [place] [address]   Aerobic/Anaerobic Culture (surgical/deep wound)     Status: None (Preliminary result)   Collection Time: 02/22/19  2:01 PM   Specimen: Synovial, Right Knee; Body Fluid  Result Value Ref Range Status   Specimen Description SYNOVIAL RIGHT KNEE  Final   Special Requests NONE  Final   Gram Stain   Final    ABUNDANT WBC PRESENT, PREDOMINANTLY PMN FEW GRAM POSITIVE RODS RARE GRAM NEGATIVE RODS RARE GRAM VARIABLE ROD    Culture   Final    MODERATE ESCHERICHIA COLI CULTURE REINCUBATED FOR BETTER GROWTH HOLDING FOR POSSIBLE ANAEROBE Performed at [facility], [place] [address], [place], [place] [address]    Report Status PENDING  Incomplete   Organism ID, Bacteria ESCHERICHIA COLI  Final      Susceptibility   Escherichia coli - MIC*    AMPICILLIN >=32 RESISTANT Resistant     CEFAZOLIN <=4 SENSITIVE Sensitive     CEFEPIME <=1 SENSITIVE Sensitive     CEFTAZIDIME <=1 SENSITIVE Sensitive     CEFTRIAXONE <=1 SENSITIVE Sensitive     CIPROFLOXACIN <=0.25 SENSITIVE Sensitive     GENTAMICIN <=1 SENSITIVE Sensitive     IMIPENEM <=0.25 SENSITIVE Sensitive     TRIMETH/SULFA >=320 RESISTANT Resistant     AMPICILLIN/SULBACTAM >=32 RESISTANT Resistant      PIP/TAZO <=4 SENSITIVE Sensitive     Extended ESBL NEGATIVE Sensitive     * MODERATE ESCHERICHIA COLI  Culture, blood (routine x 2)     Status: None (Preliminary result)   Collection Time: 02/23/19 11:57 PM   Specimen: BLOOD  Result Value Ref Range Status   Specimen Description BLOOD LEFT ANTECUBITAL  Final   Special Requests   Final    BOTTLES DRAWN AEROBIC AND ANAEROBIC Blood Culture results may not be optimal due to an inadequate volume of blood received in culture bottles   Culture   Final    NO GROWTH 1 DAY Performed at [facility], [address], [place], [place] [address]    Report Status PENDING  Incomplete  Culture, blood (routine x 2)     Status: None (Preliminary result)   Collection Time: 02/24/19 12:03 AM   Specimen: BLOOD LEFT WRIST  Result Value Ref Range Status   Specimen Description BLOOD LEFT WRIST  Final   Special Requests   Final    BOTTLES DRAWN AEROBIC ONLY Blood Culture results may not be optimal due to an inadequate volume of blood received in culture bottles   Culture   Final    NO GROWTH 1 DAY Performed at [facility], [place] [address], [place], [place] [address]    Report Status PENDING  Incomplete      Studies: No results found.  Scheduled Meds: . sodium chloride   Intravenous Once  . sodium chloride   Intravenous Once  . cholecalciferol  2,000 Units Oral BID  . diltiazem  120 mg Oral Daily  . ferrous sulfate  325 mg Oral BID  . gabapentin  300 mg Oral TID  .  metoprolol tartrate  25 mg Oral BID  . pravastatin  20 mg Oral q1800  . sodium chloride flush  3 mL Intravenous Q12H  . Vitamin D (Ergocalciferol)  50,000 Units Oral Q7 days  . warfarin  7.5 mg Oral ONCE-1800  . Warfarin - Pharmacist Dosing Inpatient   Does not apply q1800    Continuous Infusions: . cefTRIAXone (ROCEPHIN)  IV Stopped (02/24/19 2210)     LOS: 2 days     Darlin Drop, MD [facility] Pager [phone]  If 7PM-7AM, please contact  night-coverage [email or web address] Password [email or web address] 02/25/2019, 10:45 AM

## 2019-02-25 NOTE — Plan of Care (Signed)

## 2019-02-25 NOTE — Progress Notes (Signed)
ANTICOAGULATION CONSULT NOTE - Follow Up Consult  Pharmacy Consult for warfarin Indication: atrial fibrillation  Allergies  Allergen Reactions  . Aspirin Hives and Swelling    Angioedema   . Rofecoxib Hives  . Hydrocodone Hives    Tolerates oxycodone and tramadol    Vital Signs: Temp: 97.7 F (36.5 C) (11/21 0806) Temp Source: Oral (11/21 0806) BP: 119/72 (11/21 0806) Pulse Rate: 92 (11/21 0806)  Labs: Recent Labs    02/23/19 0446 02/23/19 1526 02/24/19 0003 02/25/19 0514  HGB 6.7* 7.9* 7.6* 7.9*  HCT 20.2* 24.0* 22.8* 24.3*  PLT 283  --  233 258  LABPROT 17.8*  --  15.9* 20.0*  INR 1.5*  --  1.3* 1.7*  CREATININE 1.15*  --  1.14* 1.12*    Estimated Creatinine Clearance: 51.3 mL/min (A) (by C-G formula based on SCr of 1.12 mg/dL (H)).  Assessment: 48 YOF on warfarin PTA for atrial fibrillation. On recent admission, pt was discharged 11/04 on warfarin 7.5 mg on Wednesdays and 5 mg on other days. However, pt's daughter reports she was taking 10mg  Wednesdays, Saturdays, and Sundays and 7.5mg  all other days. INR was elevated at 7.3 this admission. Warfarin was held, and Orthopedics ordered IV Vitamin K 5 mg x1 for reversal in ED on 11/18.   Today, INR remains subtherapeutic at 1.7 but trending up from previous. CBC stable, no bleeding noted. Patient intermittently in atrial fibrillation.  Goal of Therapy:  INR 2-3 Monitor platelets by anticoagulation protocol: Yes   Plan:  Give warfarin 7.5 mg x1 tonight Daily INR Monitor s/sx of bleeding   Brendolyn Patty, PharmD PGY2 Pharmacy Resident Phone [phone]  02/25/2019   10:20 AM

## 2019-02-26 LAB — TYPE AND SCREEN
ABO/RH(D): A POS
Antibody Screen: POSITIVE
DAT, IgG: NEGATIVE
Donor AG Type: NEGATIVE
Donor AG Type: NEGATIVE
PT AG Type: NEGATIVE
Unit division: 0
Unit division: 0
Unit division: 0
Unit division: 0

## 2019-02-26 LAB — BPAM RBC
Blood Product Expiration Date: 202012152359
Blood Product Expiration Date: 202012152359
Blood Product Expiration Date: 202012152359
Blood Product Expiration Date: 202012192359
ISSUE DATE / TIME: 202011182236
ISSUE DATE / TIME: 202011191032
Unit Type and Rh: 600
Unit Type and Rh: 6200
Unit Type and Rh: 6200
Unit Type and Rh: 6200

## 2019-02-26 LAB — CBC WITH DIFFERENTIAL/PLATELET
Abs Immature Granulocytes: 0.7 10*3/uL — ABNORMAL HIGH (ref 0.00–0.07)
Basophils Absolute: 0 10*3/uL (ref 0.0–0.1)
Basophils Relative: 0 %
Eosinophils Absolute: 0 10*3/uL (ref 0.0–0.5)
Eosinophils Relative: 0 %
HCT: 24.4 % — ABNORMAL LOW (ref 36.0–46.0)
Hemoglobin: 7.8 g/dL — ABNORMAL LOW (ref 12.0–15.0)
Immature Granulocytes: 5 %
Lymphocytes Relative: 14 %
Lymphs Abs: 1.9 10*3/uL (ref 0.7–4.0)
MCH: 27.8 pg (ref 26.0–34.0)
MCHC: 32 g/dL (ref 30.0–36.0)
MCV: 86.8 fL (ref 80.0–100.0)
Monocytes Absolute: 1.1 10*3/uL — ABNORMAL HIGH (ref 0.1–1.0)
Monocytes Relative: 8 %
Neutro Abs: 9.8 10*3/uL — ABNORMAL HIGH (ref 1.7–7.7)
Neutrophils Relative %: 73 %
Platelets: 306 10*3/uL (ref 150–400)
RBC: 2.81 MIL/uL — ABNORMAL LOW (ref 3.87–5.11)
RDW: 18 % — ABNORMAL HIGH (ref 11.5–15.5)
WBC: 13.6 10*3/uL — ABNORMAL HIGH (ref 4.0–10.5)
nRBC: 0.7 % — ABNORMAL HIGH (ref 0.0–0.2)

## 2019-02-26 LAB — BASIC METABOLIC PANEL
Anion gap: 8 (ref 5–15)
BUN: 14 mg/dL (ref 8–23)
CO2: 26 mmol/L (ref 22–32)
Calcium: 8.1 mg/dL — ABNORMAL LOW (ref 8.9–10.3)
Chloride: 100 mmol/L (ref 98–111)
Creatinine, Ser: 0.96 mg/dL (ref 0.44–1.00)
GFR calc Af Amer: >60 mL/min (ref >60)
GFR calc non Af Amer: 56 mL/min — ABNORMAL LOW (ref >60)
Glucose, Bld: 98 mg/dL (ref 70–99)
Potassium: 4.2 mmol/L (ref 3.5–5.1)
Sodium: 134 mmol/L — ABNORMAL LOW (ref 135–145)

## 2019-02-26 LAB — AEROBIC/ANAEROBIC CULTURE W GRAM STAIN (SURGICAL/DEEP WOUND)

## 2019-02-26 LAB — PROTIME-INR
INR: 2.8 — ABNORMAL HIGH (ref 0.8–1.2)
Prothrombin Time: 29.1 seconds — ABNORMAL HIGH (ref 11.4–15.2)

## 2019-02-26 MED ORDER — WARFARIN SODIUM 2.5 MG PO TABS
2.5000 mg | ORAL_TABLET | Freq: Once | ORAL | Status: AC
  Administered 2019-02-26: 19:00:00 2.5 mg via ORAL
  Filled 2019-02-26 (×2): qty 1

## 2019-02-26 NOTE — Progress Notes (Addendum)
PROGRESS NOTE  Robin Snow:[id number] DOB: 06-Dec-1940 DOA: 02/22/2019 PCP: [name], MD  HPI/Recap of past 53 hours: 78 year old female with history of paroxysmal A. fib on anticoagulation with Coumadin, hypertension, type 2 diabetes, stage III chronic kidney disease, prosthetic right knee with revision arthroplasty and 2018, recent admission for E. coli bacteremia presented to the emergency room with complaints of worsening right knee pain and swelling.  Patient hospitalized at [facility] from 10/19-11/14 with sepsis related to E. coli bacteria, UTI and bacterial arthritis history of the right knee.  She was treated with 2 weeks of antibiotics.  Right knee was evaluated and thought to be with no evidence of infection.  In the emergency room, patient was afebrile.  Tachycardic.  Blood pressure is stable.  WBC count 14.9.  Hemoglobin 8.1.  INR 7.3.  Swollen right knee.  X-ray showed large effusion and anterior dislocation.  Admitted with orthopedic consultation.  11/21: Ongoing treatment for E. coli bacteremia.  Persistent leukocytosis but improving.  02/26/19: Patient was seen and examined at her bedside this morning.  No acute events overnight.  She is more alert today and interactive.  Sepsis physiology is improving.  Leukocytosis is trending down however still persistent.  Repeated blood culture x2 peripherally negative to date.  We will continue IV antibiotics for now.  Will switch to oral antibiotic in the next 24 to 48 hours.  Assessment/Plan: Principal Problem:   Sepsis (HCC) Active Problems:   Diabetes mellitus with complication (HCC)   Anemia   Supratherapeutic INR   Sinus pause: 4.02-4.80sec per telemetry 09/08/2016   Closed dislocation of right knee   Effusion of right knee joint   AF (paroxysmal atrial fibrillation) (HCC)   Bacteremia due to Escherichia coli   Iron deficiency anemia due to chronic blood loss  Sepsis present on admission, E. coli  bacteremia, chronic infected right knee with dislocated prosthetic knee: Sepsis physiology resolving WBC improving from 16,000-15,000>> 13,000 Blood culture x2 drawn on 02/22/2019 + for E. coli sensitivities show resistance to ampicillin, Unasyn and Bactrim Continue Rocephin Repeated blood cultures drawn on 02/24/2019 no growth x2 days Continue to follow cultures Underwent closed reduction and aspiration of the right knee, E Coli on aspirate, no frank purulence.  Orthopedics anticipating conservative management. Right knee synovial fluid positive for E. coli resistant to ampicillin, Unasyn and Bactrim. Continue Rocephin  She will probably need revision/removal of prosthesis.  Defer to orthopedics. Patient may need long-term suppressive antibiotics.  Will discuss with infectious disease.    E. coli right knee septic arthritis Synovial fluid grew E. coli Management as per above  Acute on chronic anemia, anemia of chronic disease: Aggravated by recent procedures and hospitalizations.  Hemoglobin was less than 7.  Transfused 1 unit of PRBC with appropriate response.   Hemoglobin stable 7.9 on 02/25/2019  From 7.6 on 02/24/2019. Hemoglobin 7.8 on 02/26/2019.  Stable no sign of overt bleeding.  Paroxysmal A. fib: Rate is controlled on oral diltiazem.  On Coumadin for CVA prevention.  INR is therapeutic 2.8 on 02/26/2019.  Pneumonitis managed by pharmacy.  Resolved subtherapeutic INR On Coumadin INR 1.7 on 02/25/2019>> 2.8 on 02/26/2019 Coumadin and INR managed by pharmacy  Sick sinus syndrome status post pacemaker:  Stable  CKD stage II: She is currently on her baseline creatinine 0.9 with GFR greater than 60.  Continue to avoid nephrotoxins like NSAIDs.  Type 2 diabetes: Diet controlled at home.  Remains on sliding scale in the hospital.  Avoid hypoglycemia.  Hemoglobin A1c 5.8 on 02/03/2019.  Ambulatory dysfunction/physical debility PT OT to assess Continue fall precautions CSW  consulted for possible SNF placement.   DVT prophylaxis: Coumadin Code Status: Full code Family Communication: None today Disposition Plan:  Possible discharge in the next 24 to 48 hours pending hemodynamic stability.  Consultants:   Orthopedic surgery  Procedures:   Close reduction right knee, aspiration right knee  Antimicrobials:   Rocephin, 02/23/2019---    Objective: Vitals:   02/25/19 1915 02/26/19 0354 02/26/19 0744 02/26/19 1039  BP: 104/62 132/72 (!) 147/80 131/78  Pulse: 96 95 96 98  Resp: 18  18   Temp: 98.9 F (37.2 C) 98.9 F (37.2 C) 99.6 F (37.6 C) 99.5 F (37.5 C)  TempSrc: Oral Oral Oral Oral  SpO2: 99% 97% 99% 100%  Weight:      Height:        Intake/Output Summary (Last 24 hours) at 02/26/2019 1039 Last data filed at 02/26/2019 0900 Gross per 24 hour  Intake 720 ml  Output 300 ml  Net 420 ml   Filed Weights   02/24/19 1030  Weight: 107 kg    Exam:  . General: 78 y.o. year-old female well-developed well-nourished alert and interactive.  In no acute distress. . Cardiovascular: Regular rate and rhythm no rubs or gallops.  No JVD or thyromegaly noted.   [name] Respiratory: Clear to auscultation with no wheezes or rales.  Good inspiratory effort. . Abdomen: Obese nontender nondistended bowel sounds present.  . Musculoskeletal: 1+ pitting edema in lower extremities bilaterally.  Right knee is wrapped with stabilizer.   [name] Psychiatry: Mood is appropriate for condition and setting.   Data Reviewed: CBC: Recent Labs  Lab 02/22/19 0814  02/23/19 0446 02/23/19 1526 02/24/19 0003 02/25/19 0514 02/26/19 0650  WBC 14.9*  --  13.2*  --  18.3* 15.3* 13.6*  NEUTROABS 10.3*  --   --   --  12.3* 10.7* 9.8*  HGB 8.1*   < > 6.7* 7.9* 7.6* 7.9* 7.8*  HCT 25.6*   < > 20.2* 24.0* 22.8* 24.3* 24.4*  MCV 86.8  --  83.8  --  83.8 85.6 86.8  PLT 364  --  283  --  233 258 306   < > = values in this interval not displayed.   Basic Metabolic Panel:  Recent Labs  Lab 02/22/19 0814 02/23/19 0446 02/24/19 0003 02/25/19 0514 02/26/19 0650  NA 136 137 135 135 134*  K 3.9 4.2 4.3 4.3 4.2  CL 100 103 101 100 100  CO2 22 27 24 24 26   GLUCOSE 135* 114* 104* 101* 98  BUN 9 10 15 15 14   CREATININE 1.09* 1.15* 1.14* 1.12* 0.96  CALCIUM 8.2* 7.7* 7.9* 8.1* 8.1*  MG  --   --  1.8  --   --   PHOS  --   --  2.3*  --   --    GFR: Estimated Creatinine Clearance: 59.9 mL/min (by C-G formula based on SCr of 0.96 mg/dL). Liver Function Tests: No results for input(s): AST, ALT, ALKPHOS, BILITOT, PROT, ALBUMIN in the last 168 hours. No results for input(s): LIPASE, AMYLASE in the last 168 hours. No results for input(s): AMMONIA in the last 168 hours. Coagulation Profile: Recent Labs  Lab 02/22/19 0814 02/23/19 0446 02/24/19 0003 02/25/19 0514 02/26/19 0650  INR 7.3* 1.5* 1.3* 1.7* 2.8*   Cardiac Enzymes: No results for input(s): CKTOTAL, CKMB, CKMBINDEX, TROPONINI in the last 168 hours.  BNP (last 3 results) No results for input(s): PROBNP in the last 8760 hours. HbA1C: No results for input(s): HGBA1C in the last 72 hours. CBG: Recent Labs  Lab 02/22/19 1428  GLUCAP 135*   Lipid Profile: No results for input(s): CHOL, HDL, LDLCALC, TRIG, CHOLHDL, LDLDIRECT in the last 72 hours. Thyroid Function Tests: No results for input(s): TSH, T4TOTAL, FREET4, T3FREE, THYROIDAB in the last 72 hours. Anemia Panel: No results for input(s): VITAMINB12, FOLATE, FERRITIN, TIBC, IRON, RETICCTPCT in the last 72 hours. Urine analysis:    Component Value Date/Time   COLORURINE YELLOW 02/02/2019 1923   APPEARANCEUR CLEAR 02/02/2019 1923   LABSPEC 1.013 02/02/2019 1923   PHURINE 5.0 02/02/2019 1923   GLUCOSEU NEGATIVE 02/02/2019 1923   GLUCOSEU NEGATIVE 09/06/2017 1200   HGBUR SMALL (A) 02/02/2019 1923   BILIRUBINUR NEGATIVE 02/02/2019 1923   BILIRUBINUR Neg 10/18/2017 1453   [place] 02/02/2019 1923   PROTEINUR NEGATIVE 02/02/2019  1923   UROBILINOGEN 0.2 10/18/2017 1453   UROBILINOGEN 1.0 09/06/2017 1200   NITRITE POSITIVE (A) 02/02/2019 1923   LEUKOCYTESUR LARGE (A) 02/02/2019 1923   Sepsis Labs: @LABRCNTIP (procalcitonin:4,lacticidven:4)  ) Recent Results (from the past 240 hour(s))  Blood culture (routine x 2)     Status: None (Preliminary result)   Collection Time: 02/22/19  7:59 AM   Specimen: BLOOD  Result Value Ref Range Status   Specimen Description BLOOD RIGHT ANTECUBITAL  Final   Special Requests   Final    BOTTLES DRAWN AEROBIC ONLY Blood Culture results may not be optimal due to an inadequate volume of blood received in culture bottles   Culture   Final    NO GROWTH 4 DAYS Performed at [facility], [place] [address], [place], [place] [address]    Report Status PENDING  Incomplete  Blood culture (routine x 2)     Status: Abnormal   Collection Time: 02/22/19  8:15 AM   Specimen: BLOOD  Result Value Ref Range Status   Specimen Description BLOOD LEFT ANTECUBITAL  Final   Special Requests   Final    BOTTLES DRAWN AEROBIC AND ANAEROBIC Blood Culture results may not be optimal due to an inadequate volume of blood received in culture bottles   Culture  Setup Time   Final    GRAM NEGATIVE RODS AEROBIC BOTTLE ONLY CRITICAL RESULT CALLED TO, READ BACK BY AND VERIFIED WITH: Salli Real [id number] 02/23/2019 Mena Goes Performed at [facility], [place] [address] [place], [place] [address]    Culture ESCHERICHIA COLI (A)  Final   Report Status 02/25/2019 FINAL  Final   Organism ID, Bacteria ESCHERICHIA COLI  Final      Susceptibility   Escherichia coli - MIC*    AMPICILLIN >=32 RESISTANT Resistant     CEFAZOLIN <=4 SENSITIVE Sensitive     CEFEPIME <=1 SENSITIVE Sensitive     CEFTAZIDIME <=1 SENSITIVE Sensitive     CEFTRIAXONE <=1 SENSITIVE Sensitive     CIPROFLOXACIN <=0.25 SENSITIVE Sensitive     GENTAMICIN <=1 SENSITIVE Sensitive     IMIPENEM <=0.25 SENSITIVE Sensitive     TRIMETH/SULFA  >=320 RESISTANT Resistant     AMPICILLIN/SULBACTAM >=32 RESISTANT Resistant     PIP/TAZO <=4 SENSITIVE Sensitive     Extended ESBL NEGATIVE Sensitive     * ESCHERICHIA COLI  Blood Culture ID Panel (Reflexed)     Status: Abnormal   Collection Time: 02/22/19  8:15 AM  Result Value Ref Range Status   Enterococcus  species NOT DETECTED NOT DETECTED Final   Listeria monocytogenes NOT DETECTED NOT DETECTED Final   Staphylococcus species NOT DETECTED NOT DETECTED Final   Staphylococcus aureus (BCID) NOT DETECTED NOT DETECTED Final   Streptococcus species NOT DETECTED NOT DETECTED Final   Streptococcus agalactiae NOT DETECTED NOT DETECTED Final   Streptococcus pneumoniae NOT DETECTED NOT DETECTED Final   Streptococcus pyogenes NOT DETECTED NOT DETECTED Final   Acinetobacter baumannii NOT DETECTED NOT DETECTED Final   Enterobacteriaceae species DETECTED (A) NOT DETECTED Final    Comment: Enterobacteriaceae represent a large family of gram-negative bacteria, not a single organism. CRITICAL RESULT CALLED TO, READ BACK BY AND VERIFIED WITH: G. ABBOTT,PHARMD [id number] 02/23/2019 T. TYSOR    Enterobacter cloacae complex NOT DETECTED NOT DETECTED Final   Escherichia coli DETECTED (A) NOT DETECTED Final    Comment: CRITICAL RESULT CALLED TO, READ BACK BY AND VERIFIED WITH: G. ABBOTT,PHARMD 0328 02/23/2019 T. TYSOR    Klebsiella oxytoca NOT DETECTED NOT DETECTED Final   Klebsiella pneumoniae NOT DETECTED NOT DETECTED Final   Proteus species NOT DETECTED NOT DETECTED Final   Serratia marcescens NOT DETECTED NOT DETECTED Final   Carbapenem resistance NOT DETECTED NOT DETECTED Final   Haemophilus influenzae NOT DETECTED NOT DETECTED Final   Neisseria meningitidis NOT DETECTED NOT DETECTED Final   Pseudomonas aeruginosa NOT DETECTED NOT DETECTED Final   Candida albicans NOT DETECTED NOT DETECTED Final   Candida glabrata NOT DETECTED NOT DETECTED Final   Candida krusei NOT DETECTED NOT DETECTED Final    Candida parapsilosis NOT DETECTED NOT DETECTED Final   Candida tropicalis NOT DETECTED NOT DETECTED Final    Comment: Performed at [facility], [address] [place], [place] [address]  SARS Coronavirus 2 by RT PCR (hospital order, performed in [facility] hospital lab) Nasopharyngeal Nasopharyngeal Swab     Status: None   Collection Time: 02/22/19  9:43 AM   Specimen: Nasopharyngeal Swab  Result Value Ref Range Status   SARS Coronavirus 2 NEGATIVE NEGATIVE Final    Comment: (NOTE) If result is NEGATIVE SARS-CoV-2 target nucleic acids are NOT DETECTED. The SARS-CoV-2 RNA is generally detectable in upper and lower  respiratory specimens during the acute phase of infection. The lowest  concentration of SARS-CoV-2 viral copies this assay can detect is 250  copies / mL. A negative result does not preclude SARS-CoV-2 infection  and should not be used as the sole basis for treatment or other  patient management decisions.  A negative result may occur with  improper specimen collection / handling, submission of specimen other  than nasopharyngeal swab, presence of viral mutation(s) within the  areas targeted by this assay, and inadequate number of viral copies  (<250 copies / mL). A negative result must be combined with clinical  observations, patient history, and epidemiological information. If result is POSITIVE SARS-CoV-2 target nucleic acids are DETECTED. The SARS-CoV-2 RNA is generally detectable in upper and lower  respiratory specimens dur ing the acute phase of infection.  Positive  results are indicative of active infection with SARS-CoV-2.  Clinical  correlation with patient history and other diagnostic information is  necessary to determine patient infection status.  Positive results do  not rule out bacterial infection or co-infection with other viruses. If result is PRESUMPTIVE POSTIVE SARS-CoV-2 nucleic acids MAY BE PRESENT.   A presumptive positive result was  obtained on the submitted specimen  and confirmed on repeat testing.  While 2019 novel coronavirus  (SARS-CoV-2) nucleic acids may  be present in the submitted sample  additional confirmatory testing may be necessary for epidemiological  and / or clinical management purposes  to differentiate between  SARS-CoV-2 and other Sarbecovirus currently known to infect humans.  If clinically indicated additional testing with an alternate test  methodology [phone]([id number]) is advised. The SARS-CoV-2 RNA is generally  detectable in upper and lower respiratory sp ecimens during the acute  phase of infection. The expected result is Negative. Fact Sheet for Patients:  BoilerBrush.com.cyhttps://www.fda.gov/media/136312/download Fact Sheet for Healthcare Providers: https://pope.com/https://www.fda.gov/media/136313/download This test is not yet approved or cleared by the [place] FDA and has been authorized for detection and/or diagnosis of SARS-CoV-2 by FDA under an Emergency Use Authorization (EUA).  This EUA will remain in effect (meaning this test can be used) for the duration of the COVID-19 declaration under Section 564(b)(1) of the Act, 21 U.S.C. section 360bbb-3(b)(1), unless the authorization is terminated or revoked sooner. Performed at [facility], [address] [place], [place] [address]   Aerobic/Anaerobic Culture (surgical/deep wound)     Status: None (Preliminary result)   Collection Time: 02/22/19  2:01 PM   Specimen: Synovial, Right Knee; Body Fluid  Result Value Ref Range Status   Specimen Description SYNOVIAL RIGHT KNEE  Final   Special Requests NONE  Final   Gram Stain   Final    ABUNDANT WBC PRESENT, PREDOMINANTLY PMN FEW GRAM POSITIVE RODS RARE GRAM NEGATIVE RODS RARE GRAM VARIABLE ROD    Culture   Final    MODERATE ESCHERICHIA COLI HOLDING FOR POSSIBLE ANAEROBE Performed at [facility], [address] [place], [place] [address]    Report Status PENDING  Incomplete   Organism ID, Bacteria  ESCHERICHIA COLI  Final      Susceptibility   Escherichia coli - MIC*    AMPICILLIN >=32 RESISTANT Resistant     CEFAZOLIN <=4 SENSITIVE Sensitive     CEFEPIME <=1 SENSITIVE Sensitive     CEFTAZIDIME <=1 SENSITIVE Sensitive     CEFTRIAXONE <=1 SENSITIVE Sensitive     CIPROFLOXACIN <=0.25 SENSITIVE Sensitive     GENTAMICIN <=1 SENSITIVE Sensitive     IMIPENEM <=0.25 SENSITIVE Sensitive     TRIMETH/SULFA >=320 RESISTANT Resistant     AMPICILLIN/SULBACTAM >=32 RESISTANT Resistant     PIP/TAZO <=4 SENSITIVE Sensitive     Extended ESBL NEGATIVE Sensitive     * MODERATE ESCHERICHIA COLI  Culture, blood (routine x 2)     Status: None (Preliminary result)   Collection Time: 02/23/19 11:57 PM   Specimen: BLOOD  Result Value Ref Range Status   Specimen Description BLOOD LEFT ANTECUBITAL  Final   Special Requests   Final    BOTTLES DRAWN AEROBIC AND ANAEROBIC Blood Culture results may not be optimal due to an inadequate volume of blood received in culture bottles   Culture   Final    NO GROWTH 2 DAYS Performed at [facility], [address] [place], [place] [address]    Report Status PENDING  Incomplete  Culture, blood (routine x 2)     Status: None (Preliminary result)   Collection Time: 02/24/19 12:03 AM   Specimen: BLOOD LEFT WRIST  Result Value Ref Range Status   Specimen Description BLOOD LEFT WRIST  Final   Special Requests   Final    BOTTLES DRAWN AEROBIC ONLY Blood Culture results may not be optimal due to an inadequate volume of blood received in culture bottles   Culture   Final  NO GROWTH 2 DAYS Performed at [facility], [address] [place], [place] [address]    Report Status PENDING  Incomplete      Studies: No results found.  Scheduled Meds: . sodium chloride   Intravenous Once  . sodium chloride   Intravenous Once  . cholecalciferol  2,000 Units Oral BID  . diltiazem  120 mg Oral Daily  . ferrous sulfate  325 mg Oral BID  . gabapentin  300  mg Oral TID  . metoprolol tartrate  25 mg Oral BID  . pravastatin  20 mg Oral q1800  . sodium chloride flush  3 mL Intravenous Q12H  . Vitamin D (Ergocalciferol)  50,000 Units Oral Q7 days  . warfarin  2.5 mg Oral ONCE-1800  . Warfarin - Pharmacist Dosing Inpatient   Does not apply q1800    Continuous Infusions: . cefTRIAXone (ROCEPHIN)  IV 2 g (02/25/19 2156)     LOS: 3 days     Darlin Drop, MD [facility] Pager [phone]  If 7PM-7AM, please contact night-coverage [email or web address] Password [facility] 02/26/2019, 10:39 AM

## 2019-02-26 NOTE — Plan of Care (Signed)

## 2019-02-26 NOTE — Progress Notes (Signed)
ANTICOAGULATION CONSULT NOTE - Follow Up Consult  Pharmacy Consult for warfarin Indication: atrial fibrillation  Allergies  Allergen Reactions  . Aspirin Hives and Swelling    Angioedema   . Rofecoxib Hives  . Hydrocodone Hives    Tolerates oxycodone and tramadol    Vital Signs: Temp: 99.6 F (37.6 C) (11/22 0744) Temp Source: Oral (11/22 0744) BP: 147/80 (11/22 0744) Pulse Rate: 96 (11/22 0744)  Labs: Recent Labs    02/24/19 0003 02/25/19 0514 02/26/19 0650  HGB 7.6* 7.9* 7.8*  HCT 22.8* 24.3* 24.4*  PLT 233 258 306  LABPROT 15.9* 20.0* 29.1*  INR 1.3* 1.7* 2.8*  CREATININE 1.14* 1.12* 0.96    Estimated Creatinine Clearance: 59.9 mL/min (by C-G formula based on SCr of 0.96 mg/dL).  Assessment: 30 YOF on warfarin PTA for atrial fibrillation. On recent admission, pt was discharged 11/04 on warfarin 7.5 mg on Wednesdays and 5 mg on other days. However, pt's daughter reports she was taking 10mg  Wednesdays, Saturdays, and Sundays and 7.5mg  all other days. INR was elevated at 7.3 this admission. Warfarin was held, and Orthopedics ordered IV Vitamin K 5 mg x1 for reversal in ED on 11/18.   Today, INR back to therapeutic at 2.8 after vitamin K administration 4 days ago, but INR has trended up significantly from previous day. CBC stable, no bleeding noted. Patient intermittently in atrial fibrillation overnight. Due to significant increase in INR overnight, will redose warfarin conservatively today to avoid supratherapeutic INR.  Goal of Therapy:  INR 2-3 Monitor platelets by anticoagulation protocol: Yes   Plan:  Give warfarin 2.5 mg x1 tonight Daily INR Monitor s/sx of bleeding F/u need to hold anticoagulation pending plans for R knee prosthesis revision/removal   Brendolyn Patty, PharmD PGY2 Pharmacy Resident Phone [phone]  02/26/2019   7:57 AM

## 2019-02-26 NOTE — Progress Notes (Signed)
Updated daughter via phone. All questions answered to her satisfaction.

## 2019-02-26 NOTE — Progress Notes (Signed)
CSW acknowledge consult for SNF and spoke with due to patient's cognition. Daughter stated that she would like for patient to return home with continuing [place] through [place].  SNF consult closed and TOC will continue to follow for discharge planning needs.

## 2019-02-27 LAB — CULTURE, BLOOD (ROUTINE X 2): Culture: NO GROWTH

## 2019-02-27 LAB — CBC WITH DIFFERENTIAL/PLATELET
Abs Immature Granulocytes: 0.63 10*3/uL — ABNORMAL HIGH (ref 0.00–0.07)
Basophils Absolute: 0 10*3/uL (ref 0.0–0.1)
Basophils Relative: 0 %
Eosinophils Absolute: 0 10*3/uL (ref 0.0–0.5)
Eosinophils Relative: 0 %
HCT: 23.6 % — ABNORMAL LOW (ref 36.0–46.0)
Hemoglobin: 7.6 g/dL — ABNORMAL LOW (ref 12.0–15.0)
Immature Granulocytes: 5 %
Lymphocytes Relative: 14 %
Lymphs Abs: 1.7 10*3/uL (ref 0.7–4.0)
MCH: 27.7 pg (ref 26.0–34.0)
MCHC: 32.2 g/dL (ref 30.0–36.0)
MCV: 86.1 fL (ref 80.0–100.0)
Monocytes Absolute: 1.3 10*3/uL — ABNORMAL HIGH (ref 0.1–1.0)
Monocytes Relative: 10 %
Neutro Abs: 8.9 10*3/uL — ABNORMAL HIGH (ref 1.7–7.7)
Neutrophils Relative %: 71 %
Platelets: 292 10*3/uL (ref 150–400)
RBC: 2.74 MIL/uL — ABNORMAL LOW (ref 3.87–5.11)
RDW: 17.9 % — ABNORMAL HIGH (ref 11.5–15.5)
WBC: 12.6 10*3/uL — ABNORMAL HIGH (ref 4.0–10.5)
nRBC: 0.5 % — ABNORMAL HIGH (ref 0.0–0.2)

## 2019-02-27 LAB — PROTIME-INR
INR: 3.6 — ABNORMAL HIGH (ref 0.8–1.2)
Prothrombin Time: 35.6 seconds — ABNORMAL HIGH (ref 11.4–15.2)

## 2019-02-27 LAB — GLUCOSE, CAPILLARY
Glucose-Capillary: 108 mg/dL — ABNORMAL HIGH (ref 70–99)
Glucose-Capillary: 105 mg/dL — ABNORMAL HIGH (ref 70–99)
Glucose-Capillary: 100 mg/dL — ABNORMAL HIGH (ref 70–99)

## 2019-02-27 LAB — BASIC METABOLIC PANEL
Anion gap: 8 (ref 5–15)
BUN: 12 mg/dL (ref 8–23)
CO2: 27 mmol/L (ref 22–32)
Calcium: 7.9 mg/dL — ABNORMAL LOW (ref 8.9–10.3)
Chloride: 99 mmol/L (ref 98–111)
Creatinine, Ser: 1.06 mg/dL — ABNORMAL HIGH (ref 0.44–1.00)
GFR calc Af Amer: 58 mL/min — ABNORMAL LOW (ref >60)
GFR calc non Af Amer: 50 mL/min — ABNORMAL LOW (ref >60)
Glucose, Bld: 92 mg/dL (ref 70–99)
Potassium: 4.1 mmol/L (ref 3.5–5.1)
Sodium: 134 mmol/L — ABNORMAL LOW (ref 135–145)

## 2019-02-27 MED ORDER — OXYCODONE HCL 5 MG PO TABS
5.0000 mg | ORAL_TABLET | ORAL | Status: DC | PRN
  Administered 2019-02-27 – 2019-02-28 (×2): 5 mg via ORAL
  Filled 2019-02-27 (×3): qty 1

## 2019-02-27 MED ORDER — SENNOSIDES-DOCUSATE SODIUM 8.6-50 MG PO TABS
2.0000 | ORAL_TABLET | Freq: Two times a day (BID) | ORAL | Status: DC
  Administered 2019-02-27 – 2019-03-01 (×5): 2 via ORAL
  Filled 2019-02-27 (×6): qty 2

## 2019-02-27 NOTE — Plan of Care (Signed)

## 2019-02-27 NOTE — Progress Notes (Signed)
PROGRESS NOTE  Robin Snow AVW:[id number] DOB: [birth year] DOA: 02/22/2019 PCP: Myrlene Brokerrawford, Elizabeth A, MD  HPI/Recap of past 5524 hours: 78 year old female with history of paroxysmal A. fib on anticoagulation with Coumadin, hypertension, type 2 diabetes, stage III chronic kidney disease, prosthetic right knee with revision arthroplasty and 2018, recent admission for E. coli bacteremia presented to the emergency room with complaints of worsening right knee pain and swelling.  Patient [facility] from 10/19-11/14 with sepsis related to E. coli bacteria, UTI and bacterial arthritis history of the right knee.  She was treated with 2 weeks of antibiotics.  Right knee was evaluated and thought to be with no evidence of infection.  In the emergency room, patient was afebrile.  Tachycardic.  Blood pressure is stable.  WBC count 14.9.  Hemoglobin 8.1.  INR 7.3.  Swollen right knee.  X-ray showed large effusion and anterior dislocation.  Admitted with orthopedic consultation.  11/21:Ongoing treatment for E. coli bacteremia.  Persistent leukocytosis but improving.  11/22:Patient was seen and examined at her bedside this morning.  No acute events overnight.  She is more alert today and interactive.  Sepsis physiology is improving.  Leukocytosis is trending down however still persistent.  Repeated blood culture x2 peripherally negative to date.  We will continue IV antibiotics for now.  Will switch to oral antibiotic in the next 24 to 48 hours.  02/27/19: Patient was seen and examined at her bedside this morning. No acute events overnight.  Sepsis physiology is improving.    Infectious disease has been consulted due to E. coli septic arthritis with hardware to guide antibiotics coverage and duration for DC planning.    Evaluated by PT OT recommendation for SNF with 24-hour supervision/assistance.  Family declines SNF and would prefer to take her home.    Assessment/Plan: Principal  Problem:   Sepsis (HCC) Active Problems:   Diabetes mellitus with complication (HCC)   Anemia   Supratherapeutic INR   Sinus pause: 4.02-4.80sec per telemetry 09/08/2016   Closed dislocation of right knee   Effusion of right knee joint   AF (paroxysmal atrial fibrillation) (HCC)   Bacteremia due to Escherichia coli   Iron deficiency anemia due to chronic blood loss  Sepsis present on admission, E. coli bacteremia, chronic infected right knee with dislocated prosthetic knee: Sepsis physiology improving WBC improving from 16,000-15,000>> 13,000>> 12,000 Blood culture x2 drawn on 02/22/2019 + for E. coli sensitivities show resistance to ampicillin, Unasyn and Bactrim Continue Rocephin Repeated blood cultures drawn on 02/24/2019 no growth x3 days Continue to follow cultures Underwent closed reduction and aspiration of the right knee, E Coli on aspirate, no frank purulence.  Orthopedics anticipating conservative management. Right knee synovial fluid positive for E. coli resistant to ampicillin, Unasyn and Bactrim. Continue Rocephin  Infectious disease consulted on 02/27/2019 to assist with coverage and duration of antibiotics for DC planning.    E. coli right knee septic arthritis with hardware Synovial fluid grew E. coli Management as per above ID consulted on 02/27/19 to guide with coverage and duration of abx Continue pain management with IV morphine for severe pain as needed; oxycodone as needed for moderate pain and Tylenol as needed for mild pain.  Continue bowel regimen for opiate-induced constipation  Anemia of chronic disease: Aggravated by recent procedures and hospitalizations.  Hemoglobin was less than 7.  Transfused 1 unit of PRBC with appropriate response.   Hemoglobin stable 7.9 on 02/25/2019  From 7.6 on 02/24/2019. Hemoglobin 7.8  on 02/26/2019.  Stable no sign of overt bleeding.  Paroxysmal A. fib: Rate is controlled on oral diltiazem.  On Coumadin for CVA prevention.     Supra-therapeutic INR On Coumadin INR 3.6 on 02/27/19, on hold C/w Daily INR  Coumadin and INR managed by pharmacy  Sick sinus syndrome status post pacemaker:  Stable  CKD stage II: She is currently on her baseline creatinine 0.9 with GFR greater than 60.  Continue to avoid nephrotoxins like NSAIDs.  Type 2 diabetes: Diet controlled at home.  Remains on sliding scale in the hospital.  Avoid hypoglycemia.  Hemoglobin A1c 5.8 on 02/03/2019.  Ambulatory dysfunction/physical debility PT OT recs SNF. Family declines SNF, they prefer to bring her home. Continue PT OT with assistance and fall precautions.   DVT prophylaxis: Coumadin, supra-therapeutic on hold Code Status: Full code Family Communication: None today Disposition Plan:  Possible discharge in the next 24 to 48 hours pending hemodynamic stability.  Consultants:   Orthopedic surgery  Procedures:   Close reduction right knee, aspiration right knee  Antimicrobials:   Rocephin, 02/23/2019---    Objective: Vitals:   02/26/19 1427 02/26/19 1936 02/27/19 0521 02/27/19 0921  BP: 135/72 127/68 (!) 145/84 (!) 132/52  Pulse: 89 98 95 99  Resp: 16     Temp: 98.6 F (37 C) 98.3 F (36.8 C) 99 F (37.2 C) 99.9 F (37.7 C)  TempSrc: Oral Oral Oral Oral  SpO2: 99% 98% 94% 98%  Weight:      Height:        Intake/Output Summary (Last 24 hours) at 02/27/2019 1201 Last data filed at 02/27/2019 0900 Gross per 24 hour  Intake 720 ml  Output 1400 ml  Net -680 ml   Filed Weights   02/24/19 1030  Weight: 107 kg    Exam:  . General: 78 y.o. year-old female well-developed well-nourished no acute distress.  Alert and interactive.   . Cardiovascular: Regular rate and rhythm no rubs or gallops. [name] Respiratory: Clear to Auscultation with No Wheezes or Rales.  Good inspiratory effort. . Abdomen: Obese nontender nondistended bowel sounds present. . Musculoskeletal: 1+ pitting edema in lower extremities bilaterally.   Right knee is wrapped with stabilizer.   [name] Psychiatry: Mood is appropriate for condition and setting.   Data Reviewed: CBC: Recent Labs  Lab 02/22/19 0814  02/23/19 0446 02/23/19 1526 02/24/19 0003 02/25/19 0514 02/26/19 0650 02/27/19 0339  WBC 14.9*  --  13.2*  --  18.3* 15.3* 13.6* 12.6*  NEUTROABS 10.3*  --   --   --  12.3* 10.7* 9.8* 8.9*  HGB 8.1*   < > 6.7* 7.9* 7.6* 7.9* 7.8* 7.6*  HCT 25.6*   < > 20.2* 24.0* 22.8* 24.3* 24.4* 23.6*  MCV 86.8  --  83.8  --  83.8 85.6 86.8 86.1  PLT 364  --  283  --  233 258 306 292   < > = values in this interval not displayed.   Basic Metabolic Panel: Recent Labs  Lab 02/23/19 0446 02/24/19 0003 02/25/19 0514 02/26/19 0650 02/27/19 0339  NA 137 135 135 134* 134*  K 4.2 4.3 4.3 4.2 4.1  CL 103 101 100 100 99  CO2 GLUCOSE 114* 104* 101* 98 92  BUN CREATININE 1.15* 1.14* 1.12* 0.96 1.06*  CALCIUM 7.7* 7.9* 8.1* 8.1* 7.9*  MG  --  1.8  --   --   --  PHOS  --  2.3*  --   --   --    GFR: Estimated Creatinine Clearance: 54.2 mL/min (A) (by C-G formula based on SCr of 1.06 mg/dL (H)). Liver Function Tests: No results for input(s): AST, ALT, ALKPHOS, BILITOT, PROT, ALBUMIN in the last 168 hours. No results for input(s): LIPASE, AMYLASE in the last 168 hours. No results for input(s): AMMONIA in the last 168 hours. Coagulation Profile: Recent Labs  Lab 02/23/19 0446 02/24/19 0003 02/25/19 0514 02/26/19 0650 02/27/19 0339  INR 1.5* 1.3* 1.7* 2.8* 3.6*   Cardiac Enzymes: No results for input(s): CKTOTAL, CKMB, CKMBINDEX, TROPONINI in the last 168 hours. BNP (last 3 results) No results for input(s): PROBNP in the last 8760 hours. HbA1C: No results for input(s): HGBA1C in the last 72 hours. CBG: Recent Labs  Lab 02/22/19 1428 02/27/19 1147  GLUCAP 135* 108*   Lipid Profile: No results for input(s): CHOL, HDL, LDLCALC, TRIG, CHOLHDL, LDLDIRECT in the last 72 hours. Thyroid Function  Tests: No results for input(s): TSH, T4TOTAL, FREET4, T3FREE, THYROIDAB in the last 72 hours. Anemia Panel: No results for input(s): VITAMINB12, FOLATE, FERRITIN, TIBC, IRON, RETICCTPCT in the last 72 hours. Urine analysis:    Component Value Date/Time   COLORURINE YELLOW 02/02/2019 1923   APPEARANCEUR CLEAR 02/02/2019 1923   LABSPEC 1.013 02/02/2019 1923   PHURINE 5.0 02/02/2019 1923   GLUCOSEU NEGATIVE 02/02/2019 1923   GLUCOSEU NEGATIVE 09/06/2017 1200   HGBUR SMALL (A) 02/02/2019 1923   BILIRUBINUR NEGATIVE 02/02/2019 1923   BILIRUBINUR Neg 10/18/2017 1453   KETONESUR NEGATIVE 02/02/2019 1923   PROTEINUR NEGATIVE 02/02/2019 1923   UROBILINOGEN 0.2 10/18/2017 1453   UROBILINOGEN 1.0 09/06/2017 1200   NITRITE POSITIVE (A) 02/02/2019 1923   LEUKOCYTESUR LARGE (A) 02/02/2019 1923   Sepsis Labs: (procalcitonin:4,lacticidven:4)  ) Recent Results (from the past 240 hour(s))  Blood culture (routine x 2)     Status: None   Collection Time: 02/22/19  7:59 AM   Specimen: BLOOD  Result Value Ref Range Status   Specimen Description BLOOD RIGHT ANTECUBITAL  Final   Special Requests   Final    BOTTLES DRAWN AEROBIC ONLY Blood Culture results may not be optimal due to an inadequate volume of blood received in culture bottles   Culture   Final    NO GROWTH 5 DAYS Performed at [facility], [address] [place], [place] [address]    Report Status 02/27/2019 FINAL  Final  Blood culture (routine x 2)     Status: Abnormal   Collection Time: 02/22/19  8:15 AM   Specimen: BLOOD  Result Value Ref Range Status   Specimen Description BLOOD LEFT ANTECUBITAL  Final   Special Requests   Final    BOTTLES DRAWN AEROBIC AND ANAEROBIC Blood Culture results may not be optimal due to an inadequate volume of blood received in culture bottles   Culture  Setup Time   Final    GRAM NEGATIVE RODS AEROBIC BOTTLE ONLY CRITICAL RESULT CALLED TO, READ BACK BY AND VERIFIED WITH: Lieutenant Diego 1914 02/23/2019 Girtha Hake Performed at [facility], [address] [place], [place] [address]    Culture ESCHERICHIA COLI (A)  Final   Report Status 02/25/2019 FINAL  Final   Organism ID, Bacteria ESCHERICHIA COLI  Final      Susceptibility   Escherichia coli - MIC*    AMPICILLIN >=32 RESISTANT Resistant     CEFAZOLIN <=4 SENSITIVE Sensitive  CEFEPIME <=1 SENSITIVE Sensitive     CEFTAZIDIME <=1 SENSITIVE Sensitive     CEFTRIAXONE <=1 SENSITIVE Sensitive     CIPROFLOXACIN <=0.25 SENSITIVE Sensitive     GENTAMICIN <=1 SENSITIVE Sensitive     IMIPENEM <=0.25 SENSITIVE Sensitive     TRIMETH/SULFA >=320 RESISTANT Resistant     AMPICILLIN/SULBACTAM >=32 RESISTANT Resistant     PIP/TAZO <=4 SENSITIVE Sensitive     Extended ESBL NEGATIVE Sensitive     * ESCHERICHIA COLI  Blood Culture ID Panel (Reflexed)     Status: Abnormal   Collection Time: 02/22/19  8:15 AM  Result Value Ref Range Status   Enterococcus species NOT DETECTED NOT DETECTED Final   Listeria monocytogenes NOT DETECTED NOT DETECTED Final   Staphylococcus species NOT DETECTED NOT DETECTED Final   Staphylococcus aureus (BCID) NOT DETECTED NOT DETECTED Final   Streptococcus species NOT DETECTED NOT DETECTED Final   Streptococcus agalactiae NOT DETECTED NOT DETECTED Final   Streptococcus pneumoniae NOT DETECTED NOT DETECTED Final   Streptococcus pyogenes NOT DETECTED NOT DETECTED Final   Acinetobacter baumannii NOT DETECTED NOT DETECTED Final   Enterobacteriaceae species DETECTED (A) NOT DETECTED Final    Comment: Enterobacteriaceae represent a large family of gram-negative bacteria, not a single organism. CRITICAL RESULT CALLED TO, READ BACK BY AND VERIFIED WITH: G. ABBOTT,PHARMD [id number] 02/23/2019 T. TYSOR    Enterobacter cloacae complex NOT DETECTED NOT DETECTED Final   Escherichia coli DETECTED (A) NOT DETECTED Final    Comment: CRITICAL RESULT CALLED TO, READ BACK BY AND VERIFIED WITH: G.  ABBOTT,PHARMD [id number] 02/23/2019 T. TYSOR    Klebsiella oxytoca NOT DETECTED NOT DETECTED Final   Klebsiella pneumoniae NOT DETECTED NOT DETECTED Final   Proteus species NOT DETECTED NOT DETECTED Final   Serratia marcescens NOT DETECTED NOT DETECTED Final   Carbapenem resistance NOT DETECTED NOT DETECTED Final   Haemophilus influenzae NOT DETECTED NOT DETECTED Final   Neisseria meningitidis NOT DETECTED NOT DETECTED Final   Pseudomonas aeruginosa NOT DETECTED NOT DETECTED Final   Candida albicans NOT DETECTED NOT DETECTED Final   Candida glabrata NOT DETECTED NOT DETECTED Final   Candida krusei NOT DETECTED NOT DETECTED Final   Candida parapsilosis NOT DETECTED NOT DETECTED Final   Candida tropicalis NOT DETECTED NOT DETECTED Final    Comment: Performed at [facility], [place]. [address] [place], [place] [address]  SARS Coronavirus 2 by RT PCR (hospital order, performed in [facility] hospital lab) Nasopharyngeal Nasopharyngeal Swab     Status: None   Collection Time: 02/22/19  9:43 AM   Specimen: Nasopharyngeal Swab  Result Value Ref Range Status   SARS Coronavirus 2 NEGATIVE NEGATIVE Final    Comment: (NOTE) If result is NEGATIVE SARS-CoV-2 target nucleic acids are NOT DETECTED. The SARS-CoV-2 RNA is generally detectable in upper and lower  respiratory specimens during the acute phase of infection. The lowest  concentration of SARS-CoV-2 viral copies this assay can detect is 250  copies / mL. A negative result does not preclude SARS-CoV-2 infection  and should not be used as the sole basis for treatment or other  patient management decisions.  A negative result may occur with  improper specimen collection / handling, submission of specimen other  than nasopharyngeal swab, presence of viral mutation(s) within the  areas targeted by this assay, and inadequate number of viral copies  (<250 copies / mL). A negative result must be combined with clinical  observations, patient history,  and epidemiological information. If result  is POSITIVE SARS-CoV-2 target nucleic acids are DETECTED. The SARS-CoV-2 RNA is generally detectable in upper and lower  respiratory specimens dur ing the acute phase of infection.  Positive  results are indicative of active infection with SARS-CoV-2.  Clinical  correlation with patient history and other diagnostic information is  necessary to determine patient infection status.  Positive results do  not rule out bacterial infection or co-infection with other viruses. If result is PRESUMPTIVE POSTIVE SARS-CoV-2 nucleic acids MAY BE PRESENT.   A presumptive positive result was obtained on the submitted specimen  and confirmed on repeat testing.  While 2019 novel coronavirus  (SARS-CoV-2) nucleic acids may be present in the submitted sample  additional confirmatory testing may be necessary for epidemiological  and / or clinical management purposes  to differentiate between  SARS-CoV-2 and other Sarbecovirus currently known to infect humans.  If clinically indicated additional testing with an alternate test  methodology [phone]) is advised. The SARS-CoV-2 RNA is generally  detectable in upper and lower respiratory sp ecimens during the acute  phase of infection. The expected result is Negative. Fact Sheet for Patients:  BoilerBrush.com.cy Fact Sheet for Healthcare Providers: https://pope.com/ This test is not yet approved or cleared by the [place] FDA and has been authorized for detection and/or diagnosis of SARS-CoV-2 by FDA under an Emergency Use Authorization (EUA).  This EUA will remain in effect (meaning this test can be used) for the duration of the COVID-19 declaration under Section 564(b)(1) of the Act, 21 U.S.C. section 360bbb-3(b)(1), unless the authorization is terminated or revoked sooner. Performed at [facility], [address] [place], [place] [address]    Aerobic/Anaerobic Culture (surgical/deep wound)     Status: None   Collection Time: 02/22/19  2:01 PM   Specimen: Synovial, Right Knee; Body Fluid  Result Value Ref Range Status   Specimen Description SYNOVIAL RIGHT KNEE  Final   Special Requests NONE  Final   Gram Stain   Final    ABUNDANT WBC PRESENT, PREDOMINANTLY PMN FEW GRAM POSITIVE RODS RARE GRAM NEGATIVE RODS RARE GRAM VARIABLE ROD    Culture   Final    MODERATE ESCHERICHIA COLI NO ANAEROBES ISOLATED Performed at [facility], [address] [place], [place] [address]    Report Status 02/26/2019 FINAL  Final   Organism ID, Bacteria ESCHERICHIA COLI  Final      Susceptibility   Escherichia coli - MIC*    AMPICILLIN >=32 RESISTANT Resistant     CEFAZOLIN <=4 SENSITIVE Sensitive     CEFEPIME <=1 SENSITIVE Sensitive     CEFTAZIDIME <=1 SENSITIVE Sensitive     CEFTRIAXONE <=1 SENSITIVE Sensitive     CIPROFLOXACIN <=0.25 SENSITIVE Sensitive     GENTAMICIN <=1 SENSITIVE Sensitive     IMIPENEM <=0.25 SENSITIVE Sensitive     TRIMETH/SULFA >=320 RESISTANT Resistant     AMPICILLIN/SULBACTAM >=32 RESISTANT Resistant     PIP/TAZO <=4 SENSITIVE Sensitive     Extended ESBL NEGATIVE Sensitive     * MODERATE ESCHERICHIA COLI  Culture, blood (routine x 2)     Status: None (Preliminary result)   Collection Time: 02/23/19 11:57 PM   Specimen: BLOOD  Result Value Ref Range Status   Specimen Description BLOOD LEFT ANTECUBITAL  Final   Special Requests   Final    BOTTLES DRAWN AEROBIC AND ANAEROBIC Blood Culture results may not be optimal due to an inadequate volume of blood received in culture bottles   Culture  Final    NO GROWTH 3 DAYS Performed at [facility], [address], [place], [place] [address]    Report Status PENDING  Incomplete  Culture, blood (routine x 2)     Status: None (Preliminary result)   Collection Time: 02/24/19 12:03 AM   Specimen: BLOOD LEFT WRIST  Result Value Ref Range Status   Specimen  Description BLOOD LEFT WRIST  Final   Special Requests   Final    BOTTLES DRAWN AEROBIC ONLY Blood Culture results may not be optimal due to an inadequate volume of blood received in culture bottles   Culture   Final    NO GROWTH 3 DAYS Performed at [facility], [address], [place], [place] [address]    Report Status PENDING  Incomplete      Studies: No results found.  Scheduled Meds: . sodium chloride   Intravenous Once  . sodium chloride   Intravenous Once  . cholecalciferol  2,000 Units Oral BID  . diltiazem  120 mg Oral Daily  . ferrous sulfate  325 mg Oral BID  . gabapentin  300 mg Oral TID  . metoprolol tartrate  25 mg Oral BID  . pravastatin  20 mg Oral q1800  . senna-docusate  2 tablet Oral BID  . sodium chloride flush  3 mL Intravenous Q12H  . Vitamin D (Ergocalciferol)  50,000 Units Oral Q7 days  . Warfarin - Pharmacist Dosing Inpatient   Does not apply q1800    Continuous Infusions: . cefTRIAXone (ROCEPHIN)  IV 2 g (02/26/19 2148)     LOS: 4 days     Darlin Drop, MD [facility] Pager [phone]  If 7PM-7AM, please contact night-coverage [email or web address] Password [email or web address] 02/27/2019, 12:01 PM

## 2019-02-27 NOTE — Consult Note (Addendum)
[facility]    Date of Admission:  02/22/2019      Total days of antibiotics 6  Ceftriaxone               Reason for Consult: Ecoli bacteremia    Referring Provider: Margo Aye Primary Care Provider: [name], MD    Assessment: Robin Snow is a 78 y.o. female recently readmitted on 02/12/19 with recurrent Ecoli bacteremia and right knee pain/swelling after prosthesis became misaligned. Knee aspirate grew the same [facility] isolate as what we have found in her blood cultures. She has cleared her bacteremia however has persistent leukocytosis, painful knee and temp curve that seems to be increasing.   While gram negative organisms do not usually stick to hardware it is very concerning she had an abrupt relapse 4 days following appropriate antibiotic therapy following recent admission that suggests deeper infection source that was not previously controlled.  Would also consult cardiology to consider TEE to investigate her PPM leads to ensure no findings of lead endocarditis.   She will certainly require a more prolonged course of antibiotic therapy with the first stage IV over the next 6 weeks. With prosthetic joint infection management fr the best chance of cure would recommend pairing this with formal debridement and irrigation of the prosthesis.   We discussed PICC line placement with her. She has plans to return home to her daughter's care and would like for her to be updated. Will give her a call tomorrow AM to discuss plan for her mom's treatment.    Plan: 1. Hold on PICC for now 2. Continue Ceftriaxone (would plan for home use given her daughter also cares for brothers that have special needs to decrease burden of infusion).  3. Would recommend formal irrigation and debridement from ortho team.  4. Consultation with cardiology for TEE to evaluate PPM with very quick relapse of Ecoli bacteremia   Principal Problem:   Bacteremia due  to Escherichia coli Active Problems:   Effusion of right knee joint   Diabetes mellitus with complication (HCC)   Anemia   Supratherapeutic INR   Sinus pause: 4.02-4.80sec per telemetry 09/08/2016   Sepsis (HCC)   Closed dislocation of right knee   AF (paroxysmal atrial fibrillation) (HCC)   Iron deficiency anemia due to chronic blood loss   . sodium chloride   Intravenous Once  . sodium chloride   Intravenous Once  . cholecalciferol  2,000 Units Oral BID  . diltiazem  120 mg Oral Daily  . ferrous sulfate  325 mg Oral BID  . gabapentin  300 mg Oral TID  . metoprolol tartrate  25 mg Oral BID  . pravastatin  20 mg Oral q1800  . senna-docusate  2 tablet Oral BID  . sodium chloride flush  3 mL Intravenous Q12H  . Vitamin D (Ergocalciferol)  50,000 Units Oral Q7 days  . Warfarin - Pharmacist Dosing Inpatient   Does not apply q1800    HPI: Robin Snow is a 78 y.o. female with pmhx afib (on chronic AC with Coumadin), HTN, T2DM, CKD3, prosthetic right knee arthroplasty 2018.   She was admitted 10/29 - 02/08/19 for sepsis complicated by Ecoli bacteremia thought to be due to UTI. Right knee revealed concern for septic arthritis and Osteomyelitis at this time; ortho consulted and did not feel that her knee was the source of her infection at that time and recommended FU with her primary  surgeon in [place]. Was d/c'd on keflex x 10 days after after Ceftriaxone --> Cefazolin.   Was readmitted on 02/22/19 after 3 days of feeling poorly and hearing a loud pop in the right knee joint with eruption of fluid-filld blisters over the skin. No fever detected at home but had increased swelling, warmth and pain in the knee to the point where she could not bear weight or stand even light touch. She was afebrile in the ER with tachycardia, hypertensive, leukocytosis 14.9K. XRay revealed new large knee effusion and anterior dislocation. Dr. Jena Gauss was consulted and took her to the OR on 02/22/19 for  reduction of dislocated hardware and knee aspiration which revealed  moderate Ecoli (R-amp, amp/sulb, bactrim). Blood was also + for the same isolate. Operative note did not indicate any gross purulence at the time and only hematoma that was evacuated.   She does have a pacemaker in place (dual lead). Her leukocytosis is improving slowly but remains elevated now on treatment day 6 (12.6 today) and temperature curve seems to be going up.   Sed Rate (mm/hr)  Date Value  02/02/2019 78 (H)  10/22/2015 39 (H)   CRP (mg/dL)  Date Value  95/28/4132 19.7 (H)    Review of Systems: Review of Systems  Constitutional: Positive for malaise/fatigue. Negative for chills and fever.  Eyes: Negative for blurred vision.  Respiratory: Negative for cough.   Cardiovascular: Negative for chest pain.  Gastrointestinal: Negative for abdominal pain, diarrhea, nausea and vomiting.  Genitourinary: Negative for dysuria.  Musculoskeletal: Positive for joint pain (severe right knee pain ).  Skin: Positive for rash. Negative for itching.  Neurological: Negative for dizziness and headaches.    Past Medical History:  Diagnosis Date  . AKI (acute kidney injury) (HCC) 04/2016  . Anemia   . Arthritis   . Chronic atrial fibrillation (HCC)   . Chronic edema   . Chronic venous insufficiency   . CKD (chronic kidney disease), stage III   . Coagulopathy (HCC)   . Diabetes mellitus    Type 2  . Diverticulosis   . Dyslipidemia   . GERD (gastroesophageal reflux disease)   . GI bleed 2015   a. lower GI from diverticular source 2015.  [name] Hx of transfusion of packed red blood cells   . Hypertension   . Morbid obesity (HCC)   . Obstructive sleep apnea    on CPAP  . Pulmonary hypertension (HCC)   . Pyelonephritis 04/2016  . Renal infarct (HCC)    a. 04/2016- adm with flank pain, ? pyelo vs renal infarct in setting of recent subtherapeutic INR.  [name] Sinus bradycardia   . Stroke [facility]) 2015    Social History    Tobacco Use  . Smoking status: Former Smoker    Packs/day: 0.12    Years: 34.00    Pack years: 4.08    Types: Cigarettes    Quit date: 07/07/1993    Years since quitting: 25.6  . Smokeless tobacco: Never Used  Substance Use Topics  . Alcohol use: No    Comment: no longer drinks alcohol  . Drug use: No    Family History  Problem Relation Age of Onset  . Cancer Father   . Stroke Maternal Aunt    Allergies  Allergen Reactions  . Aspirin Hives and Swelling    Angioedema   . Rofecoxib Hives  . Hydrocodone Hives    Tolerates oxycodone and tramadol    OBJECTIVE: Blood pressure (!) 132/52, pulse 99,  temperature 99.9 F (37.7 C), temperature source Oral, resp. rate 16, height 5\' 7"  (1.702 m), weight 107 kg, SpO2 98 %.  Physical Exam Vitals signs and nursing note reviewed.  Constitutional:      Comments: Resting in bed. Tearful at times due to pain.   HENT:     Mouth/Throat:     Mouth: Mucous membranes are moist.     Pharynx: Oropharynx is clear. No oropharyngeal exudate.  Eyes:     General: No scleral icterus.    Pupils: Pupils are equal, round, and reactive to light.  Cardiovascular:     Rate and Rhythm: Normal rate and regular rhythm.     Pulses: Normal pulses.     Heart sounds: No murmur.  Pulmonary:     Effort: Pulmonary effort is normal.     Breath sounds: Normal breath sounds.  Abdominal:     General: Bowel sounds are normal. There is no distension.     Palpations: Abdomen is soft.     Tenderness: There is no abdominal tenderness.  Musculoskeletal:     Comments: Right knee in soft immobilizer and wrapped in ACE bandage. She has chronic lymphedema and associated skin changes to RLE which is normal for her. She is extremely tender extending down to her calf. Unable to examine knee directly due to discomfort. Not overtly warm over wrap  Skin:    General: Skin is warm and dry.     Capillary Refill: Capillary refill takes less than 2 seconds.  Neurological:      General: No focal deficit present.     Mental Status: She is alert and oriented to person, place, and time.     Lab Results Lab Results  Component Value Date   WBC 12.6 (H) 02/27/2019   HGB 7.6 (L) 02/27/2019   HCT 23.6 (L) 02/27/2019   MCV 86.1 02/27/2019   PLT 292 02/27/2019    Lab Results  Component Value Date   CREATININE 1.06 (H) 02/27/2019   BUN 12 02/27/2019   NA 134 (L) 02/27/2019   K 4.1 02/27/2019   CL 99 02/27/2019   CO2 27 02/27/2019    Lab Results  Component Value Date   ALT 13 02/04/2019   AST 15 02/04/2019   ALKPHOS 79 02/04/2019   BILITOT 0.9 02/04/2019     Microbiology: Recent Results (from the past 240 hour(s))  Blood culture (routine x 2)     Status: None   Collection Time: 02/22/19  7:59 AM   Specimen: BLOOD  Result Value Ref Range Status   Specimen Description BLOOD RIGHT ANTECUBITAL  Final   Special Requests   Final    BOTTLES DRAWN AEROBIC ONLY Blood Culture results may not be optimal due to an inadequate volume of blood received in culture bottles   Culture   Final    NO GROWTH 5 DAYS Performed at [facility], [place] [address], [place], [place] [address]    Report Status 02/27/2019 FINAL  Final  Blood culture (routine x 2)     Status: Abnormal   Collection Time: 02/22/19  8:15 AM   Specimen: BLOOD  Result Value Ref Range Status   Specimen Description BLOOD LEFT ANTECUBITAL  Final   Special Requests   Final    BOTTLES DRAWN AEROBIC AND ANAEROBIC Blood Culture results may not be optimal due to an inadequate volume of blood received in culture bottles   Culture  Setup Time   Final    GRAM NEGATIVE  RODS AEROBIC BOTTLE ONLY CRITICAL RESULT CALLED TO, READ BACK BY AND VERIFIED WITH: Lieutenant Diego [id number] 02/23/2019 Girtha Hake Performed at [facility], [address] [place], [place] [address]    Culture ESCHERICHIA COLI (A)  Final   Report Status 02/25/2019 FINAL  Final   Organism ID, Bacteria ESCHERICHIA COLI  Final       Susceptibility   Escherichia coli - MIC*    AMPICILLIN >=32 RESISTANT Resistant     CEFAZOLIN <=4 SENSITIVE Sensitive     CEFEPIME <=1 SENSITIVE Sensitive     CEFTAZIDIME <=1 SENSITIVE Sensitive     CEFTRIAXONE <=1 SENSITIVE Sensitive     CIPROFLOXACIN <=0.25 SENSITIVE Sensitive     GENTAMICIN <=1 SENSITIVE Sensitive     IMIPENEM <=0.25 SENSITIVE Sensitive     TRIMETH/SULFA >=320 RESISTANT Resistant     AMPICILLIN/SULBACTAM >=32 RESISTANT Resistant     PIP/TAZO <=4 SENSITIVE Sensitive     Extended ESBL NEGATIVE Sensitive     * ESCHERICHIA COLI  Blood Culture ID Panel (Reflexed)     Status: Abnormal   Collection Time: 02/22/19  8:15 AM  Result Value Ref Range Status   Enterococcus species NOT DETECTED NOT DETECTED Final   Listeria monocytogenes NOT DETECTED NOT DETECTED Final   Staphylococcus species NOT DETECTED NOT DETECTED Final   Staphylococcus aureus (BCID) NOT DETECTED NOT DETECTED Final   Streptococcus species NOT DETECTED NOT DETECTED Final   Streptococcus agalactiae NOT DETECTED NOT DETECTED Final   Streptococcus pneumoniae NOT DETECTED NOT DETECTED Final   Streptococcus pyogenes NOT DETECTED NOT DETECTED Final   Acinetobacter baumannii NOT DETECTED NOT DETECTED Final   Enterobacteriaceae species DETECTED (A) NOT DETECTED Final    Comment: Enterobacteriaceae represent a large family of gram-negative bacteria, not a single organism. CRITICAL RESULT CALLED TO, READ BACK BY AND VERIFIED WITH: G. ABBOTT,PHARMD [id number] 02/23/2019 T. TYSOR    Enterobacter cloacae complex NOT DETECTED NOT DETECTED Final   Escherichia coli DETECTED (A) NOT DETECTED Final    Comment: CRITICAL RESULT CALLED TO, READ BACK BY AND VERIFIED WITH: G. ABBOTT,PHARMD 0328 02/23/2019 T. TYSOR    Klebsiella oxytoca NOT DETECTED NOT DETECTED Final   Klebsiella pneumoniae NOT DETECTED NOT DETECTED Final   Proteus species NOT DETECTED NOT DETECTED Final   Serratia marcescens NOT DETECTED NOT DETECTED Final    Carbapenem resistance NOT DETECTED NOT DETECTED Final   Haemophilus influenzae NOT DETECTED NOT DETECTED Final   Neisseria meningitidis NOT DETECTED NOT DETECTED Final   Pseudomonas aeruginosa NOT DETECTED NOT DETECTED Final   Candida albicans NOT DETECTED NOT DETECTED Final   Candida glabrata NOT DETECTED NOT DETECTED Final   Candida krusei NOT DETECTED NOT DETECTED Final   Candida parapsilosis NOT DETECTED NOT DETECTED Final   Candida tropicalis NOT DETECTED NOT DETECTED Final    Comment: Performed at [facility], [address], [place], [place] [address]  SARS Coronavirus 2 by RT PCR (hospital order, performed in [facility] hospital lab) Nasopharyngeal Nasopharyngeal Swab     Status: None   Collection Time: 02/22/19  9:43 AM   Specimen: Nasopharyngeal Swab  Result Value Ref Range Status   SARS Coronavirus 2 NEGATIVE NEGATIVE Final    Comment: (NOTE) If result is NEGATIVE SARS-CoV-2 target nucleic acids are NOT DETECTED. The SARS-CoV-2 RNA is generally detectable in upper and lower  respiratory specimens during the acute phase of infection. The lowest  concentration of SARS-CoV-2 viral copies this assay can detect is 250  copies / mL. A negative result does not preclude SARS-CoV-2 infection  and should not be used as the sole basis for treatment or other  patient management decisions.  A negative result may occur with  improper specimen collection / handling, submission of specimen other  than nasopharyngeal swab, presence of viral mutation(s) within the  areas targeted by this assay, and inadequate number of viral copies  (<250 copies / mL). A negative result must be combined with clinical  observations, patient history, and epidemiological information. If result is POSITIVE SARS-CoV-2 target nucleic acids are DETECTED. The SARS-CoV-2 RNA is generally detectable in upper and lower  respiratory specimens dur ing the acute phase of infection.  Positive  results are  indicative of active infection with SARS-CoV-2.  Clinical  correlation with patient history and other diagnostic information is  necessary to determine patient infection status.  Positive results do  not rule out bacterial infection or co-infection with other viruses. If result is PRESUMPTIVE POSTIVE SARS-CoV-2 nucleic acids MAY BE PRESENT.   A presumptive positive result was obtained on the submitted specimen  and confirmed on repeat testing.  While 2019 novel coronavirus  (SARS-CoV-2) nucleic acids may be present in the submitted sample  additional confirmatory testing may be necessary for epidemiological  and / or clinical management purposes  to differentiate between  SARS-CoV-2 and other Sarbecovirus currently known to infect humans.  If clinically indicated additional testing with an alternate test  methodology [phone]) is advised. The SARS-CoV-2 RNA is generally  detectable in upper and lower respiratory sp ecimens during the acute  phase of infection. The expected result is Negative. Fact Sheet for Patients:  BoilerBrush.com.cy Fact Sheet for Healthcare Providers: https://pope.com/ This test is not yet approved or cleared by the [place] FDA and has been authorized for detection and/or diagnosis of SARS-CoV-2 by FDA under an Emergency Use Authorization (EUA).  This EUA will remain in effect (meaning this test can be used) for the duration of the COVID-19 declaration under Section 564(b)(1) of the Act, 21 U.S.C. section 360bbb-3(b)(1), unless the authorization is terminated or revoked sooner. Performed at [facility], [address] [place], [place] [address]   Aerobic/Anaerobic Culture (surgical/deep wound)     Status: None   Collection Time: 02/22/19  2:01 PM   Specimen: Synovial, Right Knee; Body Fluid  Result Value Ref Range Status   Specimen Description SYNOVIAL RIGHT KNEE  Final   Special Requests NONE   Final   Gram Stain   Final    ABUNDANT WBC PRESENT, PREDOMINANTLY PMN FEW GRAM POSITIVE RODS RARE GRAM NEGATIVE RODS RARE GRAM VARIABLE ROD    Culture   Final    MODERATE ESCHERICHIA COLI NO ANAEROBES ISOLATED Performed at [facility] [place], [address] [place], [place] [address]    Report Status 02/26/2019 FINAL  Final   Organism ID, Bacteria ESCHERICHIA COLI  Final      Susceptibility   Escherichia coli - MIC*    AMPICILLIN >=32 RESISTANT Resistant     CEFAZOLIN <=4 SENSITIVE Sensitive     CEFEPIME <=1 SENSITIVE Sensitive     CEFTAZIDIME <=1 SENSITIVE Sensitive     CEFTRIAXONE <=1 SENSITIVE Sensitive     CIPROFLOXACIN <=0.25 SENSITIVE Sensitive     GENTAMICIN <=1 SENSITIVE Sensitive     IMIPENEM <=0.25 SENSITIVE Sensitive     TRIMETH/SULFA >=320 RESISTANT Resistant     AMPICILLIN/SULBACTAM >=32 RESISTANT Resistant     PIP/TAZO <=4 SENSITIVE Sensitive  Extended ESBL NEGATIVE Sensitive     * MODERATE ESCHERICHIA COLI  Culture, blood (routine x 2)     Status: None (Preliminary result)   Collection Time: 02/23/19 11:57 PM   Specimen: BLOOD  Result Value Ref Range Status   Specimen Description BLOOD LEFT ANTECUBITAL  Final   Special Requests   Final    BOTTLES DRAWN AEROBIC AND ANAEROBIC Blood Culture results may not be optimal due to an inadequate volume of blood received in culture bottles   Culture   Final    NO GROWTH 3 DAYS Performed at [facility], [address] [place], [place] [address]    Report Status PENDING  Incomplete  Culture, blood (routine x 2)     Status: None (Preliminary result)   Collection Time: 02/24/19 12:03 AM   Specimen: BLOOD LEFT WRIST  Result Value Ref Range Status   Specimen Description BLOOD LEFT WRIST  Final   Special Requests   Final    BOTTLES DRAWN AEROBIC ONLY Blood Culture results may not be optimal due to an inadequate volume of blood received in culture bottles   Culture   Final    NO GROWTH 3 DAYS Performed at  [facility], [address] [place], [place] [address]    Report Status PENDING  Incomplete    Rexene AlbertsStephanie Idalys Konecny, MSN, NP-C [facility] Cell: [phone] Pager: [phone]  02/27/2019 4:57 PM

## 2019-02-27 NOTE — Progress Notes (Signed)
ANTICOAGULATION CONSULT NOTE - Follow Up Consult  Pharmacy Consult for warfarin Indication: atrial fibrillation  Allergies  Allergen Reactions  . Aspirin Hives and Swelling    Angioedema   . Rofecoxib Hives  . Hydrocodone Hives    Tolerates oxycodone and tramadol    Vital Signs: Temp: 99 F (37.2 C) (11/23 0521) Temp Source: Oral (11/23 0521) BP: 145/84 (11/23 0521) Pulse Rate: 95 (11/23 0521)  Labs: Recent Labs    02/25/19 0514 02/26/19 0650 02/27/19 0339  HGB 7.9* 7.8* 7.6*  HCT 24.3* 24.4* 23.6*  PLT 258 306 292  LABPROT 20.0* 29.1* 35.6*  INR 1.7* 2.8* 3.6*  CREATININE 1.12* 0.96 1.06*    Estimated Creatinine Clearance: 54.2 mL/min (A) (by C-G formula based on SCr of 1.06 mg/dL (H)).  Assessment: 78 YOF on warfarin PTA for atrial fibrillation. On recent admission, pt was discharged 11/04 on warfarin 7.5 mg on Wednesdays and 5 mg on other days. However, pt's daughter reports she was taking 10mg  Wednesdays, Saturdays, and Sundays and 7.5mg  all other days. INR was elevated at 7.3 this admission. Warfarin was held, and Orthopedics ordered IV Vitamin K 5 mg x1 for reversal in ED on 11/18.   Today, INR is now supratherapeutic after large increases over the last 2 days. No bleeding noted. CBC is stable.   Goal of Therapy:  INR 2-3 Monitor platelets by anticoagulation protocol: Yes   Plan:  No warfarin tonight Daily INR  Salome Arnt, PharmD, BCPS Clinical Pharmacist Please see AMION for all pharmacy numbers 02/27/2019 7:54 AM

## 2019-02-28 LAB — CBC WITH DIFFERENTIAL/PLATELET
Abs Immature Granulocytes: 0.56 10*3/uL — ABNORMAL HIGH (ref 0.00–0.07)
Basophils Absolute: 0 10*3/uL (ref 0.0–0.1)
Basophils Relative: 0 %
Eosinophils Absolute: 0 10*3/uL (ref 0.0–0.5)
Eosinophils Relative: 0 %
HCT: 23.3 % — ABNORMAL LOW (ref 36.0–46.0)
Hemoglobin: 7.5 g/dL — ABNORMAL LOW (ref 12.0–15.0)
Immature Granulocytes: 5 %
Lymphocytes Relative: 15 %
Lymphs Abs: 1.7 10*3/uL (ref 0.7–4.0)
MCH: 28 pg (ref 26.0–34.0)
MCHC: 32.2 g/dL (ref 30.0–36.0)
MCV: 86.9 fL (ref 80.0–100.0)
Monocytes Absolute: 1.6 10*3/uL — ABNORMAL HIGH (ref 0.1–1.0)
Monocytes Relative: 14 %
Neutro Abs: 7.6 10*3/uL (ref 1.7–7.7)
Neutrophils Relative %: 66 %
Platelets: 285 10*3/uL (ref 150–400)
RBC: 2.68 MIL/uL — ABNORMAL LOW (ref 3.87–5.11)
RDW: 17.7 % — ABNORMAL HIGH (ref 11.5–15.5)
WBC: 11.5 10*3/uL — ABNORMAL HIGH (ref 4.0–10.5)
nRBC: 0.4 % — ABNORMAL HIGH (ref 0.0–0.2)

## 2019-02-28 LAB — PROTIME-INR
INR: 4.2 (ref 0.8–1.2)
Prothrombin Time: 40.5 seconds — ABNORMAL HIGH (ref 11.4–15.2)

## 2019-02-28 LAB — GLUCOSE, CAPILLARY
Glucose-Capillary: 90 mg/dL (ref 70–99)
Glucose-Capillary: 143 mg/dL — ABNORMAL HIGH (ref 70–99)
Glucose-Capillary: 96 mg/dL (ref 70–99)
Glucose-Capillary: 112 mg/dL — ABNORMAL HIGH (ref 70–99)

## 2019-02-28 MED ORDER — OXYCODONE HCL 5 MG PO TABS
5.0000 mg | ORAL_TABLET | ORAL | Status: DC | PRN
  Administered 2019-03-01: 03:00:00 5 mg via ORAL
  Filled 2019-02-28: qty 1

## 2019-02-28 MED ORDER — METOPROLOL TARTRATE 5 MG/5ML IV SOLN
2.5000 mg | Freq: Once | INTRAVENOUS | Status: AC
  Administered 2019-02-28: 2.5 mg via INTRAVENOUS

## 2019-02-28 MED ORDER — PHYTONADIONE 5 MG PO TABS
2.5000 mg | ORAL_TABLET | Freq: Once | ORAL | Status: AC
  Administered 2019-02-28: 2.5 mg via ORAL
  Filled 2019-02-28: qty 1

## 2019-02-28 NOTE — Progress Notes (Signed)
Lab called this morning about patient INR being 4.2, attending doctor paged, no issues noted with patient , waiting for orders, will continue to monitor.

## 2019-02-28 NOTE — Progress Notes (Signed)
ANTICOAGULATION CONSULT NOTE - Follow Up Consult  Pharmacy Consult for warfarin Indication: atrial fibrillation  Allergies  Allergen Reactions  . Aspirin Hives and Swelling    Angioedema   . Rofecoxib Hives  . Hydrocodone Hives    Tolerates oxycodone and tramadol    Vital Signs: Temp: 98.6 F (37 C) (11/24 0330) Temp Source: Oral (11/24 0330) BP: 120/64 (11/24 0330) Pulse Rate: 60 (11/24 0330)  Labs: Recent Labs    02/26/19 0650 02/27/19 0339 02/28/19 0243  HGB 7.8* 7.6* 7.5*  HCT 24.4* 23.6* 23.3*  PLT 306 292 285  LABPROT 29.1* 35.6* 40.5*  INR 2.8* 3.6* 4.2*  CREATININE 0.96 1.06*  --     Estimated Creatinine Clearance: 54.2 mL/min (A) (by C-G formula based on SCr of 1.06 mg/dL (H)).  Assessment: 2 YOF on warfarin PTA for atrial fibrillation. On recent admission, pt was discharged 11/04 on warfarin 7.5 mg on Wednesdays and 5 mg on other days. However, pt's daughter reports she was taking 10mg  Wednesdays, Saturdays, and Sundays and 7.5mg  all other days. INR was elevated at 7.3 this admission. Warfarin was held, and Orthopedics ordered IV Vitamin K 5 mg x1 for reversal in ED on 11/18.   INR today remains SUPRAtherapeutic despite holding yesterdays' dose (INR 4.2 << 3.6, goal of 2-3). Hgb is low but stable, plts wnl. No bleeding noted.   Ortho proposed AKA to patient to help control the infection - will follow-up on plans for this and need to continue to hold warfarin and/or reverse.   Goal of Therapy:  INR 2-3 Monitor platelets by anticoagulation protocol: Yes   Plan:  - Hold Warfarin again today - Daily PT/INR, CBC q72h - Will continue to monitor for any signs/symptoms of bleeding and will follow up with PT/INR in the a.m.    Thank you for allowing pharmacy to be a part of this patient's care.  Alycia Rossetti, PharmD, BCPS Clinical Pharmacist Clinical phone for 02/28/2019: [phone] 02/28/2019 8:13 AM   **Pharmacist phone directory can now be found on  [email or web address] ([email or web address]).  Listed under [place].

## 2019-02-28 NOTE — Plan of Care (Signed)
  Problem: Education: Goal: Knowledge of General Education information will improve Description: Including pain rating scale, medication(s)/side effects and non-pharmacologic comfort measures Outcome: Progressing   Problem: Clinical Measurements: Goal: Ability to maintain clinical measurements within normal limits will improve Outcome: Progressing Goal: Will remain free from infection Outcome: Progressing   

## 2019-02-28 NOTE — Progress Notes (Signed)
[facility]  Date of Admission:  02/22/2019      Total days of antibiotics 7           ASSESSMENT: Robin Snow is a 78 y.o. female with recurrent Ecoli bacteremia and now with culture + aspiration from right prosthetic knee joint. Dr. Jena Gauss has see her and not recommending I&D at this time. Given her relapsing bacteremia will need to have cardiology perform TEE to assess for lead associated endocarditis. While it is uncommon for gram negative organisms to stick to prosthetic material her recurrent bacteremia is most concerning.   Had a bleeding episode from knee in the setting of supratherapeutic INR.    PLAN: 1. Continue Ceftriaxone IV  2. TEE 03/01/19   Principal Problem:   Bacteremia due to Escherichia coli Active Problems:   Effusion of right knee joint   Diabetes mellitus with complication (HCC)   Anemia   Supratherapeutic INR   Sinus pause: 4.02-4.80sec per telemetry 09/08/2016   Sepsis (HCC)   Closed dislocation of right knee   AF (paroxysmal atrial fibrillation) (HCC)   Iron deficiency anemia due to chronic blood loss    sodium chloride   Intravenous Once   sodium chloride   Intravenous Once   cholecalciferol  2,000 Units Oral BID   diltiazem  120 mg Oral Daily   ferrous sulfate  325 mg Oral BID   gabapentin  300 mg Oral TID   metoprolol tartrate  25 mg Oral BID   pravastatin  20 mg Oral q1800   senna-docusate  2 tablet Oral BID   sodium chloride flush  3 mL Intravenous Q12H   Vitamin D (Ergocalciferol)  50,000 Units Oral Q7 days   Warfarin - Pharmacist Dosing Inpatient   Does not apply q1800    SUBJECTIVE: Severe 10/10 pain in her right knee. Had a bleeding episode a short while ago now stopped with pressure dressing over knee.   TEE is scheduled tomorrow 11/25  Review of Systems: Review of Systems  Constitutional: Positive for chills. Negative for fever and malaise/fatigue.  Respiratory:  Negative for cough and shortness of breath.   Gastrointestinal: Negative for abdominal pain, diarrhea and vomiting.  Musculoskeletal: Positive for joint pain. Negative for back pain.  Skin: Negative for itching and rash.  Neurological: Negative for weakness.    Allergies  Allergen Reactions   Aspirin Hives and Swelling    Angioedema    Rofecoxib Hives   Hydrocodone Hives    Tolerates oxycodone and tramadol    OBJECTIVE: Vitals:   02/27/19 2014 02/28/19 0330 02/28/19 0843 02/28/19 1043  BP: 134/66 120/64 125/63 122/84  Pulse: 61 60 61 (!) 54  Resp:      Temp: 98.4 F (36.9 C) 98.6 F (37 C) 98.8 F (37.1 C)   TempSrc: Oral Oral Oral   SpO2: 97% 98% 98% 97%  Weight:      Height:       Body mass index is 36.95 kg/m.  Physical Exam Constitutional:      Appearance: She is not ill-appearing.  HENT:     Mouth/Throat:     Mouth: Mucous membranes are dry.  Eyes:     General: No scleral icterus.    Pupils: Pupils are equal, round, and reactive to light.  Cardiovascular:     Rate and Rhythm: Normal rate and regular rhythm.     Heart sounds: No murmur.     Comments:  Left chest generator pocket unremarkable and non tender.  Pulmonary:     Effort: Pulmonary effort is normal.     Breath sounds: Normal breath sounds. No wheezing or rhonchi.  Abdominal:     General: Abdomen is flat. There is no distension.     Tenderness: There is no abdominal tenderness.  Musculoskeletal:        General: Swelling (R knee) and tenderness (R knee) present.  Skin:    General: Skin is warm and dry.     Capillary Refill: Capillary refill takes less than 2 seconds.  Neurological:     Mental Status: She is alert and oriented to person, place, and time.  Psychiatric:     Comments: Tearful with pain      Lab Results Lab Results  Component Value Date   WBC 11.5 (H) 02/28/2019   HGB 7.5 (L) 02/28/2019   HCT 23.3 (L) 02/28/2019   MCV 86.9 02/28/2019   PLT 285 02/28/2019    Lab  Results  Component Value Date   CREATININE 1.06 (H) 02/27/2019   BUN 12 02/27/2019   NA 134 (L) 02/27/2019   K 4.1 02/27/2019   CL 99 02/27/2019   CO2 27 02/27/2019    Lab Results  Component Value Date   ALT 13 02/04/2019   AST 15 02/04/2019   ALKPHOS 79 02/04/2019   BILITOT 0.9 02/04/2019     Microbiology: Recent Results (from the past 240 hour(s))  Blood culture (routine x 2)     Status: None   Collection Time: 02/22/19  7:59 AM   Specimen: BLOOD  Result Value Ref Range Status   Specimen Description BLOOD RIGHT ANTECUBITAL  Final   Special Requests   Final    BOTTLES DRAWN AEROBIC ONLY Blood Culture results may not be optimal due to an inadequate volume of blood received in culture bottles   Culture   Final    NO GROWTH 5 DAYS Performed at [facility], [address], [place], [place] [address]    Report Status 02/27/2019 FINAL  Final  Blood culture (routine x 2)     Status: Abnormal   Collection Time: 02/22/19  8:15 AM   Specimen: BLOOD  Result Value Ref Range Status   Specimen Description BLOOD LEFT ANTECUBITAL  Final   Special Requests   Final    BOTTLES DRAWN AEROBIC AND ANAEROBIC Blood Culture results may not be optimal due to an inadequate volume of blood received in culture bottles   Culture  Setup Time   Final    GRAM NEGATIVE RODS AEROBIC BOTTLE ONLY CRITICAL RESULT CALLED TO, READ BACK BY AND VERIFIED WITH: Salli Real [id number] 02/23/2019 Mena Goes Performed at [facility], [place] [address], [place], [place] [address]    Culture ESCHERICHIA COLI (A)  Final   Report Status 02/25/2019 FINAL  Final   Organism ID, Bacteria ESCHERICHIA COLI  Final      Susceptibility   Escherichia coli - MIC*    AMPICILLIN >=32 RESISTANT Resistant     CEFAZOLIN <=4 SENSITIVE Sensitive     CEFEPIME <=1 SENSITIVE Sensitive     CEFTAZIDIME <=1 SENSITIVE Sensitive     CEFTRIAXONE <=1 SENSITIVE Sensitive     CIPROFLOXACIN <=0.25 SENSITIVE Sensitive     GENTAMICIN  <=1 SENSITIVE Sensitive     IMIPENEM <=0.25 SENSITIVE Sensitive     TRIMETH/SULFA >=320 RESISTANT Resistant     AMPICILLIN/SULBACTAM >=32 RESISTANT Resistant     PIP/TAZO <=4 SENSITIVE  Sensitive     Extended ESBL NEGATIVE Sensitive     * ESCHERICHIA COLI  Blood Culture ID Panel (Reflexed)     Status: Abnormal   Collection Time: 02/22/19  8:15 AM  Result Value Ref Range Status   Enterococcus species NOT DETECTED NOT DETECTED Final   Listeria monocytogenes NOT DETECTED NOT DETECTED Final   Staphylococcus species NOT DETECTED NOT DETECTED Final   Staphylococcus aureus (BCID) NOT DETECTED NOT DETECTED Final   Streptococcus species NOT DETECTED NOT DETECTED Final   Streptococcus agalactiae NOT DETECTED NOT DETECTED Final   Streptococcus pneumoniae NOT DETECTED NOT DETECTED Final   Streptococcus pyogenes NOT DETECTED NOT DETECTED Final   Acinetobacter baumannii NOT DETECTED NOT DETECTED Final   Enterobacteriaceae species DETECTED (A) NOT DETECTED Final    Comment: Enterobacteriaceae represent a large family of gram-negative bacteria, not a single organism. CRITICAL RESULT CALLED TO, READ BACK BY AND VERIFIED WITH: G. ABBOTT,PHARMD [id number] 02/23/2019 T. TYSOR    Enterobacter cloacae complex NOT DETECTED NOT DETECTED Final   Escherichia coli DETECTED (A) NOT DETECTED Final    Comment: CRITICAL RESULT CALLED TO, READ BACK BY AND VERIFIED WITH: G. ABBOTT,PHARMD 0328 02/23/2019 T. TYSOR    Klebsiella oxytoca NOT DETECTED NOT DETECTED Final   Klebsiella pneumoniae NOT DETECTED NOT DETECTED Final   Proteus species NOT DETECTED NOT DETECTED Final   Serratia marcescens NOT DETECTED NOT DETECTED Final   Carbapenem resistance NOT DETECTED NOT DETECTED Final   Haemophilus influenzae NOT DETECTED NOT DETECTED Final   Neisseria meningitidis NOT DETECTED NOT DETECTED Final   Pseudomonas aeruginosa NOT DETECTED NOT DETECTED Final   Candida albicans NOT DETECTED NOT DETECTED Final   Candida glabrata NOT  DETECTED NOT DETECTED Final   Candida krusei NOT DETECTED NOT DETECTED Final   Candida parapsilosis NOT DETECTED NOT DETECTED Final   Candida tropicalis NOT DETECTED NOT DETECTED Final    Comment: Performed at [facility], [address] [place], [place] [address]  SARS Coronavirus 2 by RT PCR (hospital order, performed in [facility] hospital lab) Nasopharyngeal Nasopharyngeal Swab     Status: None   Collection Time: 02/22/19  9:43 AM   Specimen: Nasopharyngeal Swab  Result Value Ref Range Status   SARS Coronavirus 2 NEGATIVE NEGATIVE Final    Comment: (NOTE) If result is NEGATIVE SARS-CoV-2 target nucleic acids are NOT DETECTED. The SARS-CoV-2 RNA is generally detectable in upper and lower  respiratory specimens during the acute phase of infection. The lowest  concentration of SARS-CoV-2 viral copies this assay can detect is 250  copies / mL. A negative result does not preclude SARS-CoV-2 infection  and should not be used as the sole basis for treatment or other  patient management decisions.  A negative result may occur with  improper specimen collection / handling, submission of specimen other  than nasopharyngeal swab, presence of viral mutation(s) within the  areas targeted by this assay, and inadequate number of viral copies  (<250 copies / mL). A negative result must be combined with clinical  observations, patient history, and epidemiological information. If result is POSITIVE SARS-CoV-2 target nucleic acids are DETECTED. The SARS-CoV-2 RNA is generally detectable in upper and lower  respiratory specimens dur ing the acute phase of infection.  Positive  results are indicative of active infection with SARS-CoV-2.  Clinical  correlation with patient history and other diagnostic information is  necessary to determine patient infection status.  Positive results do  not rule out bacterial infection  or co-infection with other viruses. If result is PRESUMPTIVE  POSTIVE SARS-CoV-2 nucleic acids MAY BE PRESENT.   A presumptive positive result was obtained on the submitted specimen  and confirmed on repeat testing.  While 2019 novel coronavirus  (SARS-CoV-2) nucleic acids may be present in the submitted sample  additional confirmatory testing may be necessary for epidemiological  and / or clinical management purposes  to differentiate between  SARS-CoV-2 and other Sarbecovirus currently known to infect humans.  If clinically indicated additional testing with an alternate test  methodology [phone]) is advised. The SARS-CoV-2 RNA is generally  detectable in upper and lower respiratory sp ecimens during the acute  phase of infection. The expected result is Negative. Fact Sheet for Patients:  BoilerBrush.com.cy Fact Sheet for Healthcare Providers: https://pope.com/ This test is not yet approved or cleared by the [place] FDA and has been authorized for detection and/or diagnosis of SARS-CoV-2 by FDA under an Emergency Use Authorization (EUA).  This EUA will remain in effect (meaning this test can be used) for the duration of the COVID-19 declaration under Section 564(b)(1) of the Act, 21 U.S.C. section 360bbb-3(b)(1), unless the authorization is terminated or revoked sooner. Performed at [facility], [address] [place], [place] [address]   Aerobic/Anaerobic Culture (surgical/deep wound)     Status: None   Collection Time: 02/22/19  2:01 PM   Specimen: Synovial, Right Knee; Body Fluid  Result Value Ref Range Status   Specimen Description SYNOVIAL RIGHT KNEE  Final   Special Requests NONE  Final   Gram Stain   Final    ABUNDANT WBC PRESENT, PREDOMINANTLY PMN FEW GRAM POSITIVE RODS RARE GRAM NEGATIVE RODS RARE GRAM VARIABLE ROD    Culture   Final    MODERATE ESCHERICHIA COLI NO ANAEROBES ISOLATED Performed at [facility], [address], [place], [place] [address]     Report Status 02/26/2019 FINAL  Final   Organism ID, Bacteria ESCHERICHIA COLI  Final      Susceptibility   Escherichia coli - MIC*    AMPICILLIN >=32 RESISTANT Resistant     CEFAZOLIN <=4 SENSITIVE Sensitive     CEFEPIME <=1 SENSITIVE Sensitive     CEFTAZIDIME <=1 SENSITIVE Sensitive     CEFTRIAXONE <=1 SENSITIVE Sensitive     CIPROFLOXACIN <=0.25 SENSITIVE Sensitive     GENTAMICIN <=1 SENSITIVE Sensitive     IMIPENEM <=0.25 SENSITIVE Sensitive     TRIMETH/SULFA >=320 RESISTANT Resistant     AMPICILLIN/SULBACTAM >=32 RESISTANT Resistant     PIP/TAZO <=4 SENSITIVE Sensitive     Extended ESBL NEGATIVE Sensitive     * MODERATE ESCHERICHIA COLI  Culture, blood (routine x 2)     Status: None (Preliminary result)   Collection Time: 02/23/19 11:57 PM   Specimen: BLOOD  Result Value Ref Range Status   Specimen Description BLOOD LEFT ANTECUBITAL  Final   Special Requests   Final    BOTTLES DRAWN AEROBIC AND ANAEROBIC Blood Culture results may not be optimal due to an inadequate volume of blood received in culture bottles   Culture   Final    NO GROWTH 3 DAYS Performed at [facility], [address], [place], [place] [address]    Report Status PENDING  Incomplete  Culture, blood (routine x 2)     Status: None (Preliminary result)   Collection Time: 02/24/19 12:03 AM   Specimen: BLOOD LEFT WRIST  Result Value Ref Range Status   Specimen Description  BLOOD LEFT WRIST  Final   Special Requests   Final    BOTTLES DRAWN AEROBIC ONLY Blood Culture results may not be optimal due to an inadequate volume of blood received in culture bottles   Culture   Final    NO GROWTH 3 DAYS Performed at [facility], [address], [place], [place] [address]    Report Status PENDING  Incomplete    Rexene Alberts, MSN, NP-C [facility]  [email or web address] Pager: [phone] Office: [phone] [facility] Main Line:  [phone]

## 2019-02-28 NOTE — Progress Notes (Signed)
Robin Snow:[id number] DOB: 25-Mar-1941 DOA: 02/22/2019 PCP: [name], MD  HPI/Recap of past 48 hours: 78 year old female with history of paroxysmal A. fib on anticoagulation with Coumadin, hypertension, type 2 diabetes, stage III chronic kidney disease, prosthetic right knee with revision arthroplasty and 2018, recent admission for E. coli bacteremia presented to the emergency room with complaints of worsening right knee pain and swelling.  Patient hospitalized at [facility] from 10/19-11/14 with sepsis related to E. coli bacteria, UTI and bacterial arthritis history of the right knee.  She was treated with 2 weeks of antibiotics.  Right knee was evaluated and thought to be with no evidence of infection.  In the emergency room, patient was afebrile.  Tachycardic.  Blood pressure is stable.  WBC count 14.9.  Hemoglobin 8.1.  INR 7.3.  Swollen right knee.  X-ray showed large effusion and anterior dislocation.  Admitted with orthopedic consultation.  11/21: Ongoing treatment for recurrent E. coli bacteremia. Leukocytosis improving.  11/22: More alert and interactive.  Sepsis physiology is improving.  Leukocytosis is trending down.  Repeated blood culture x2 peripherally negative to date.  11/23: Infectious disease consulted due to E. coli septic arthritis with hardware to guide antibiotics coverage and duration for DC planning.    Evaluated by PT OT recommendation for SNF with 24-hour supervision/assistance.  Family declines SNF and would prefer to take her home.  02/28/19: Patient was seen and examined at her bedside this morning.  She reports significant pain in her right knee.  Orthopedic surgery following, appreciate expertise.  Infectious disease following with recommendation for TEE due to concern for pacemaker involvement in the setting of recurrent E. coli bacteremia.  Cardiology has been consulted for possible TEE.     Assessment/Plan:  Principal Problem:   Bacteremia due to Escherichia coli Active Problems:   Diabetes mellitus with complication (HCC)   Anemia   Supratherapeutic INR   Sinus pause: 4.02-4.80sec per telemetry 09/08/2016   Sepsis (HCC)   Closed dislocation of right knee   Effusion of right knee joint   AF (paroxysmal atrial fibrillation) (HCC)   Iron deficiency anemia due to chronic blood loss  Sepsis present on admission, recurrent E. coli bacteremia, chronic infected right knee with dislocated prosthetic knee: Sepsis physiology improving WBC improving from 16,000-15,000>> 13,000>> 12,000> 11,000 Blood culture x2 drawn on 02/22/2019 + for E. coli sensitivities show resistance to ampicillin, Unasyn and Bactrim Continue Rocephin, will need 6 weeks of IV antibiotics total. Repeated blood cultures drawn on 02/24/2019 no growth x3 days Continue to follow cultures until finalized. Hold off PICC line until cultures are finalized and okay with infectious disease to proceed. Underwent closed reduction and aspiration of the right knee, E Coli on aspirate, no frank purulence.    Orthopedic surgery following.  Appreciate expertise.  Right knee synovial fluid + E. coli resistant to ampicillin, Unasyn and Bactrim. Continue Rocephin  Appreciate infectious disease assistance.  Recurrent E. coli bacteremia Infectious disease recommends TEE due to recurrent E. coli bacteremia with concern for pacemaker involvement Cardiology consulted on 02/28/2019 for possible TEE  E. coli right knee septic arthritis with hardware Synovial fluid grew E. coli Management as per above ID consulted on 02/27/19 to guide with coverage and duration of abx Continue pain management with IV morphine for severe pain as needed; oxycodone as needed for moderate pain and Tylenol as needed for mild pain.  Continue bowel regimen for opiate-induced constipation. Management per infectious disease and  orthopedic surgery.  Anemia of chronic disease:  Aggravated by recent procedures and hospitalizations.  Hemoglobin was less than 7.  Transfused 1 unit of PRBC with appropriate response.   Hemoglobin stable 7.9 on 02/25/2019  From 7.6 on 02/24/2019. Hemoglobin 7.8 on 02/26/2019.  Stable no sign of overt bleeding. Hemoglobin 7.5 on 02/28/2019.  No sign of overt bleeding.   Closely monitor H&H since the patient is on Coumadin and currently supratherapeutic with INR 4.2 on 02/28/2019.  Paroxysmal A. fib: Rate is controlled on oral diltiazem.  On Coumadin for CVA prevention.    Supra-therapeutic INR On Coumadin INR 3.6 on 02/27/19, Coumadin on hold INR 4.2 on 02/28/2019, Coumadin on hold Continue Daily INR  Coumadin and INR managed by pharmacy  Sick sinus syndrome status post pacemaker placement:  Stable.  CKD stage II: Baseline creatinine appears to be 0.9 with GFR greater than 60.  Creatinine 1.0 with GFR of 58 on 02/27/2019.  Continue to avoid nephrotoxins like NSAIDs and hypotension.  Monitor urine output and continue to monitor renal function  Type 2 diabetes: Diet controlled at home.  Remains on sliding scale in the hospital.  Avoid hypoglycemia.  Hemoglobin A1c 5.8 on 02/03/2019.  Avoid hypoglycemia.  Ambulatory dysfunction/physical debility PT OT recs SNF. Family declines SNF, they prefer to bring her home. Continue PT OT with assistance and fall precautions.   DVT prophylaxis: Coumadin on hold due to supratherapeutic INR. Code Status: Full code Family Communication:  Will update daughter on 02/28/2019. Disposition Plan:  Anticipate discharge when infectious disease and orthopedic surgery sign off.  Consultants:   Orthopedic surgery  Infectious disease  Procedures:   Close reduction right knee, aspiration right knee  Antimicrobials:   Rocephin, 02/23/2019---    Objective: Vitals:   02/27/19 1800 02/27/19 2014 02/28/19 0330 02/28/19 0843  BP: 123/78 134/66 120/64 125/63  Pulse: 75 61 60 61  Resp:       Temp: 98.8 F (37.1 C) 98.4 F (36.9 C) 98.6 F (37 C) 98.8 F (37.1 C)  TempSrc: Oral Oral Oral Oral  SpO2: 97% 97% 98% 98%  Weight:      Height:        Intake/Output Summary (Last 24 hours) at 02/28/2019 1014 Last data filed at 02/27/2019 1700 Gross per 24 hour  Intake 240 ml  Output -  Net 240 ml   Filed Weights   02/24/19 1030  Weight: 107 kg    Exam:  . General: 78 y.o. year-old female obese in no acute distress.  Alert and interactive.   . Cardiovascular: Regular rate and rhythm no rubs or gallops.  No JVD or thyromegaly noted. [name] Respiratory: Clear to auscultation no wheezes or rales.   Abdomen: Obese nontender nondistended normal bowel sounds present.   . Musculoskeletal: Right lower extremities wrapped in stabilizer.   [name] Psychiatry: Mood is appropriate for condition and setting.   Data Reviewed: CBC: Recent Labs  Lab 02/24/19 0003 02/25/19 0514 02/26/19 0650 02/27/19 0339 02/28/19 0243  WBC 18.3* 15.3* 13.6* 12.6* 11.5*  NEUTROABS 12.3* 10.7* 9.8* 8.9* 7.6  HGB 7.6* 7.9* 7.8* 7.6* 7.5*  HCT 22.8* 24.3* 24.4* 23.6* 23.3*  MCV 83.8 85.6 86.8 86.1 86.9  PLT 233 258 306 292 285   Basic Metabolic Panel: Recent Labs  Lab 02/23/19 0446 02/24/19 0003 02/25/19 0514 02/26/19 0650 02/27/19 0339  NA 137 135 135 134* 134*  K 4.2 4.3 4.3 4.2 4.1  CL 103 101 100 100 99  CO2 27 24 24  26 27  GLUCOSE 114* 104* 101* 98 92  BUN 10 15 15 14 12   CREATININE 1.15* 1.14* 1.12* 0.96 1.06*  CALCIUM 7.7* 7.9* 8.1* 8.1* 7.9*  MG  --  1.8  --   --   --   PHOS  --  2.3*  --   --   --    GFR: Estimated Creatinine Clearance: 54.2 mL/min (A) (by C-G formula based on SCr of 1.06 mg/dL (H)). Liver Function Tests: No results for input(s): AST, ALT, ALKPHOS, BILITOT, PROT, ALBUMIN in the last 168 hours. No results for input(s): LIPASE, AMYLASE in the last 168 hours. No results for input(s): AMMONIA in the last 168 hours. Coagulation Profile: Recent Labs  Lab 02/24/19  0003 02/25/19 0514 02/26/19 0650 02/27/19 0339 02/28/19 0243  INR 1.3* 1.7* 2.8* 3.6* 4.2*   Cardiac Enzymes: No results for input(s): CKTOTAL, CKMB, CKMBINDEX, TROPONINI in the last 168 hours. BNP (last 3 results) No results for input(s): PROBNP in the last 8760 hours. HbA1C: No results for input(s): HGBA1C in the last 72 hours. CBG: Recent Labs  Lab 02/22/19 1428 02/27/19 1147 02/27/19 1702 02/27/19 2119 02/28/19 0637  GLUCAP 135* 108* 105* 100* 90   Lipid Profile: No results for input(s): CHOL, HDL, LDLCALC, TRIG, CHOLHDL, LDLDIRECT in the last 72 hours. Thyroid Function Tests: No results for input(s): TSH, T4TOTAL, FREET4, T3FREE, THYROIDAB in the last 72 hours. Anemia Panel: No results for input(s): VITAMINB12, FOLATE, FERRITIN, TIBC, IRON, RETICCTPCT in the last 72 hours. Urine analysis:    Component Value Date/Time   COLORURINE YELLOW 02/02/2019 1923   APPEARANCEUR CLEAR 02/02/2019 1923   LABSPEC 1.013 02/02/2019 1923   PHURINE 5.0 02/02/2019 1923   GLUCOSEU NEGATIVE 02/02/2019 1923   GLUCOSEU NEGATIVE 09/06/2017 1200   HGBUR SMALL (A) 02/02/2019 1923   BILIRUBINUR NEGATIVE 02/02/2019 1923   BILIRUBINUR Neg 10/18/2017 1453   KETONESUR NEGATIVE 02/02/2019 1923   PROTEINUR NEGATIVE 02/02/2019 1923   UROBILINOGEN 0.2 10/18/2017 1453   UROBILINOGEN 1.0 09/06/2017 1200   NITRITE POSITIVE (A) 02/02/2019 1923   LEUKOCYTESUR LARGE (A) 02/02/2019 1923   Sepsis Labs: @LABRCNTIP (procalcitonin:4,lacticidven:4)  ) Recent Results (from the past 240 hour(s))  Blood culture (routine x 2)     Status: None   Collection Time: 02/22/19  7:59 AM   Specimen: BLOOD  Result Value Ref Range Status   Specimen Description BLOOD RIGHT ANTECUBITAL  Final   Special Requests   Final    BOTTLES DRAWN AEROBIC ONLY Blood Culture results may not be optimal due to an inadequate volume of blood received in culture bottles   Culture   Final    NO GROWTH 5 DAYS Performed at [facility], [address] [place], [place] [address]    Report Status 02/27/2019 FINAL  Final  Blood culture (routine x 2)     Status: Abnormal   Collection Time: 02/22/19  8:15 AM   Specimen: BLOOD  Result Value Ref Range Status   Specimen Description BLOOD LEFT ANTECUBITAL  Final   Special Requests   Final    BOTTLES DRAWN AEROBIC AND ANAEROBIC Blood Culture results may not be optimal due to an inadequate volume of blood received in culture bottles   Culture  Setup Time   Final    GRAM NEGATIVE RODS AEROBIC BOTTLE ONLY CRITICAL RESULT CALLED TO, READ BACK BY AND VERIFIED WITH: Lieutenant Diego [id number] 02/23/2019 Girtha Hake Performed at [facility], [address], [place], [place] [address]  Culture ESCHERICHIA COLI (A)  Final   Report Status 02/25/2019 FINAL  Final   Organism ID, Bacteria ESCHERICHIA COLI  Final      Susceptibility   Escherichia coli - MIC*    AMPICILLIN >=32 RESISTANT Resistant     CEFAZOLIN <=4 SENSITIVE Sensitive     CEFEPIME <=1 SENSITIVE Sensitive     CEFTAZIDIME <=1 SENSITIVE Sensitive     CEFTRIAXONE <=1 SENSITIVE Sensitive     CIPROFLOXACIN <=0.25 SENSITIVE Sensitive     GENTAMICIN <=1 SENSITIVE Sensitive     IMIPENEM <=0.25 SENSITIVE Sensitive     TRIMETH/SULFA >=320 RESISTANT Resistant     AMPICILLIN/SULBACTAM >=32 RESISTANT Resistant     PIP/TAZO <=4 SENSITIVE Sensitive     Extended ESBL NEGATIVE Sensitive     * ESCHERICHIA COLI  Blood Culture ID Panel (Reflexed)     Status: Abnormal   Collection Time: 02/22/19  8:15 AM  Result Value Ref Range Status   Enterococcus species NOT DETECTED NOT DETECTED Final   Listeria monocytogenes NOT DETECTED NOT DETECTED Final   Staphylococcus species NOT DETECTED NOT DETECTED Final   Staphylococcus aureus (BCID) NOT DETECTED NOT DETECTED Final   Streptococcus species NOT DETECTED NOT DETECTED Final   Streptococcus agalactiae NOT DETECTED NOT DETECTED Final   Streptococcus pneumoniae NOT DETECTED NOT  DETECTED Final   Streptococcus pyogenes NOT DETECTED NOT DETECTED Final   Acinetobacter baumannii NOT DETECTED NOT DETECTED Final   Enterobacteriaceae species DETECTED (A) NOT DETECTED Final    Comment: Enterobacteriaceae represent a large family of gram-negative bacteria, not a single organism. CRITICAL RESULT CALLED TO, READ BACK BY AND VERIFIED WITH: G. ABBOTT,PHARMD 0328 02/23/2019 T. TYSOR    Enterobacter cloacae complex NOT DETECTED NOT DETECTED Final   Escherichia coli DETECTED (A) NOT DETECTED Final    Comment: CRITICAL RESULT CALLED TO, READ BACK BY AND VERIFIED WITH: G. ABBOTT,PHARMD 0328 02/23/2019 T. TYSOR    Klebsiella oxytoca NOT DETECTED NOT DETECTED Final   Klebsiella pneumoniae NOT DETECTED NOT DETECTED Final   Proteus species NOT DETECTED NOT DETECTED Final   Serratia marcescens NOT DETECTED NOT DETECTED Final   Carbapenem resistance NOT DETECTED NOT DETECTED Final   Haemophilus influenzae NOT DETECTED NOT DETECTED Final   Neisseria meningitidis NOT DETECTED NOT DETECTED Final   Pseudomonas aeruginosa NOT DETECTED NOT DETECTED Final   Candida albicans NOT DETECTED NOT DETECTED Final   Candida glabrata NOT DETECTED NOT DETECTED Final   Candida krusei NOT DETECTED NOT DETECTED Final   Candida parapsilosis NOT DETECTED NOT DETECTED Final   Candida tropicalis NOT DETECTED NOT DETECTED Final    Comment: Performed at [facility], [address], [place], [place] [address]  SARS Coronavirus 2 by RT PCR (hospital order, performed in [facility] hospital lab) Nasopharyngeal Nasopharyngeal Swab     Status: None   Collection Time: 02/22/19  9:43 AM   Specimen: Nasopharyngeal Swab  Result Value Ref Range Status   SARS Coronavirus 2 NEGATIVE NEGATIVE Final    Comment: (NOTE) If result is NEGATIVE SARS-CoV-2 target nucleic acids are NOT DETECTED. The SARS-CoV-2 RNA is generally detectable in upper and lower  respiratory specimens during the acute phase of infection. The  lowest  concentration of SARS-CoV-2 viral copies this assay can detect is 250  copies / mL. A negative result does not preclude SARS-CoV-2 infection  and should not be used as the sole basis for treatment or other  patient management decisions.  A negative result may occur with  improper specimen collection / handling, submission of specimen other  than nasopharyngeal swab, presence of viral mutation(s) within the  areas targeted by this assay, and inadequate number of viral copies  (<250 copies / mL). A negative result must be combined with clinical  observations, patient history, and epidemiological information. If result is POSITIVE SARS-CoV-2 target nucleic acids are DETECTED. The SARS-CoV-2 RNA is generally detectable in upper and lower  respiratory specimens dur ing the acute phase of infection.  Positive  results are indicative of active infection with SARS-CoV-2.  Clinical  correlation with patient history and other diagnostic information is  necessary to determine patient infection status.  Positive results do  not rule out bacterial infection or co-infection with other viruses. If result is PRESUMPTIVE POSTIVE SARS-CoV-2 nucleic acids MAY BE PRESENT.   A presumptive positive result was obtained on the submitted specimen  and confirmed on repeat testing.  While 2019 novel coronavirus  (SARS-CoV-2) nucleic acids may be present in the submitted sample  additional confirmatory testing may be necessary for epidemiological  and / or clinical management purposes  to differentiate between  SARS-CoV-2 and other Sarbecovirus currently known to infect humans.  If clinically indicated additional testing with an alternate test  methodology [phone]) is advised. The SARS-CoV-2 RNA is generally  detectable in upper and lower respiratory sp ecimens during the acute  phase of infection. The expected result is Negative. Fact Sheet for Patients:  BoilerBrush.com.cy  Fact Sheet for Healthcare Providers: https://pope.com/ This test is not yet approved or cleared by the [place] FDA and has been authorized for detection and/or diagnosis of SARS-CoV-2 by FDA under an Emergency Use Authorization (EUA).  This EUA will remain in effect (meaning this test can be used) for the duration of the COVID-19 declaration under Section 564(b)(1) of the Act, 21 U.S.C. section 360bbb-3(b)(1), unless the authorization is terminated or revoked sooner. Performed at [facility], [address], [place], [place] [address]   Aerobic/Anaerobic Culture (surgical/deep wound)     Status: None   Collection Time: 02/22/19  2:01 PM   Specimen: Synovial, Right Knee; Body Fluid  Result Value Ref Range Status   Specimen Description SYNOVIAL RIGHT KNEE  Final   Special Requests NONE  Final   Gram Stain   Final    ABUNDANT WBC PRESENT, PREDOMINANTLY PMN FEW GRAM POSITIVE RODS RARE GRAM NEGATIVE RODS RARE GRAM VARIABLE ROD    Culture   Final    MODERATE ESCHERICHIA COLI NO ANAEROBES ISOLATED Performed at [facility], [address], [place], [place] [address]    Report Status 02/26/2019 FINAL  Final   Organism ID, Bacteria ESCHERICHIA COLI  Final      Susceptibility   Escherichia coli - MIC*    AMPICILLIN >=32 RESISTANT Resistant     CEFAZOLIN <=4 SENSITIVE Sensitive     CEFEPIME <=1 SENSITIVE Sensitive     CEFTAZIDIME <=1 SENSITIVE Sensitive     CEFTRIAXONE <=1 SENSITIVE Sensitive     CIPROFLOXACIN <=0.25 SENSITIVE Sensitive     GENTAMICIN <=1 SENSITIVE Sensitive     IMIPENEM <=0.25 SENSITIVE Sensitive     TRIMETH/SULFA >=320 RESISTANT Resistant     AMPICILLIN/SULBACTAM >=32 RESISTANT Resistant     PIP/TAZO <=4 SENSITIVE Sensitive     Extended ESBL NEGATIVE Sensitive     * MODERATE ESCHERICHIA COLI  Culture, blood (routine x 2)     Status: None (Preliminary result)   Collection Time: 02/23/19 11:57 PM   Specimen:  BLOOD   Result Value Ref Range Status   Specimen Description BLOOD LEFT ANTECUBITAL  Final   Special Requests   Final    BOTTLES DRAWN AEROBIC AND ANAEROBIC Blood Culture results may not be optimal due to an inadequate volume of blood received in culture bottles   Culture   Final    NO GROWTH 3 DAYS Performed at [facility], [address] [place], [place] [address]    Report Status PENDING  Incomplete  Culture, blood (routine x 2)     Status: None (Preliminary result)   Collection Time: 02/24/19 12:03 AM   Specimen: BLOOD LEFT WRIST  Result Value Ref Range Status   Specimen Description BLOOD LEFT WRIST  Final   Special Requests   Final    BOTTLES DRAWN AEROBIC ONLY Blood Culture results may not be optimal due to an inadequate volume of blood received in culture bottles   Culture   Final    NO GROWTH 3 DAYS Performed at [facility], [address] [place], [place] [address]    Report Status PENDING  Incomplete      Studies: No results found.  Scheduled Meds: . sodium chloride   Intravenous Once  . sodium chloride   Intravenous Once  . cholecalciferol  2,000 Units Oral BID  . diltiazem  120 mg Oral Daily  . ferrous sulfate  325 mg Oral BID  . gabapentin  300 mg Oral TID  . metoprolol tartrate  25 mg Oral BID  . phytonadione  2.5 mg Oral Once  . pravastatin  20 mg Oral q1800  . senna-docusate  2 tablet Oral BID  . sodium chloride flush  3 mL Intravenous Q12H  . Vitamin D (Ergocalciferol)  50,000 Units Oral Q7 days  . Warfarin - Pharmacist Dosing Inpatient   Does not apply q1800    Continuous Infusions: . cefTRIAXone (ROCEPHIN)  IV 2 g (02/27/19 2121)     LOS: 5 days     Darlin Drop, MD [facility] Pager [phone]  If 7PM-7AM, please contact night-coverage [email or web address] Password [email or web address] 02/28/2019, 10:14 AM

## 2019-02-28 NOTE — Progress Notes (Signed)
PT Cancellation Note  Patient Details Name: Robin Snow MRN: [id number] DOB: 09-Apr-1940   Cancelled Treatment:    Reason Eval/Treat Not Completed: Pain limiting ability to participate   Discussed pt with Murray Hodgkins, RN; [name]'s pain level currently precludes mobility attempts;   Noted latest Ortho note, and recognize that she has to make some tough decisions;   Worth considering Palliative Medicine Consult;   Will follow up later today as time allows;  Otherwise, will follow up for PT tomorrow;   Thank you,  Roney Marion, PT  Acute Rehabilitation Services Pager [phone] Office [phone]     Colletta Maryland 02/28/2019, 10:43 AM

## 2019-02-28 NOTE — Progress Notes (Signed)
Ortho Trauma Progress Note  Continues with severe pain. States its has been so bad in the last year that she has thought about killing herself but states that because she wants to be there for her family she hasnt considered that.  Aspiration with E Coli growing. 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  I feel only option would be an above knee amputation which would provide source control and alleviate the chronic pain the patient has been having. I presented this as an option to the patient and she will discuss with her daughter. Will continue to follow.  Shona Needles, MD [facility] [phone] (office) [email or web address]

## 2019-02-28 NOTE — Plan of Care (Signed)
  Problem: Safety: Goal: Ability to remain free from injury will improve Outcome: Progressing   

## 2019-02-28 NOTE — Progress Notes (Signed)
Dr. Doreatha Martin and Silvestre Gunner paged about pt. Nursing students went to turn pt on her side, pt's right knee started to bleed significantly. Pressure applied, vital signs taken, and ortho paged. ABD pads and ACE wrap placed on right knee.

## 2019-02-28 NOTE — Progress Notes (Signed)
Called the patient's daughter Janett Billow and gave updates. All questions answered to her satisfaction.

## 2019-03-01 ENCOUNTER — Inpatient Hospital Stay (HOSPITAL_COMMUNITY): Payer: Medicare HMO | Admitting: Certified Registered"

## 2019-03-01 ENCOUNTER — Inpatient Hospital Stay (HOSPITAL_COMMUNITY): Payer: Medicare HMO

## 2019-03-01 ENCOUNTER — Telehealth: Payer: Self-pay | Admitting: Cardiovascular Disease

## 2019-03-01 ENCOUNTER — Encounter (HOSPITAL_COMMUNITY): Payer: Self-pay | Admitting: Anesthesiology

## 2019-03-01 ENCOUNTER — Encounter (HOSPITAL_COMMUNITY): Payer: Self-pay | Admitting: Certified Registered"

## 2019-03-01 ENCOUNTER — Encounter (HOSPITAL_COMMUNITY)
Admission: EM | Disposition: A | Discharge status: Home / Self Care | Payer: Self-pay | Source: Home / Self Care | Attending: Pulmonary Disease

## 2019-03-01 ENCOUNTER — Other Ambulatory Visit (HOSPITAL_COMMUNITY): Payer: Medicare HMO

## 2019-03-01 DIAGNOSIS — L02419 Cutaneous abscess of limb, unspecified: Secondary | ICD-10-CM

## 2019-03-01 DIAGNOSIS — E43 Unspecified severe protein-calorie malnutrition: Secondary | ICD-10-CM

## 2019-03-01 DIAGNOSIS — S83104A Unspecified dislocation of right knee, initial encounter: Secondary | ICD-10-CM

## 2019-03-01 DIAGNOSIS — A4151 Sepsis due to Escherichia coli [E. coli]: Secondary | ICD-10-CM | POA: Diagnosis not present

## 2019-03-01 DIAGNOSIS — R7881 Bacteremia: Secondary | ICD-10-CM

## 2019-03-01 DIAGNOSIS — L8990 Pressure ulcer of unspecified site, unspecified stage: Secondary | ICD-10-CM | POA: Insufficient documentation

## 2019-03-01 DIAGNOSIS — M86451 Chronic osteomyelitis with draining sinus, right femur: Secondary | ICD-10-CM

## 2019-03-01 DIAGNOSIS — T8453XA Infection and inflammatory reaction due to internal right knee prosthesis, initial encounter: Principal | ICD-10-CM

## 2019-03-01 DIAGNOSIS — M869 Osteomyelitis, unspecified: Secondary | ICD-10-CM

## 2019-03-01 DIAGNOSIS — B9620 Unspecified Escherichia coli [E. coli] as the cause of diseases classified elsewhere: Secondary | ICD-10-CM | POA: Diagnosis not present

## 2019-03-01 HISTORY — PX: AMPUTATION: SHX166

## 2019-03-01 LAB — CBC WITH DIFFERENTIAL/PLATELET
Abs Immature Granulocytes: 0.46 10*3/uL — ABNORMAL HIGH (ref 0.00–0.07)
Basophils Absolute: 0 10*3/uL (ref 0.0–0.1)
Basophils Relative: 0 %
Eosinophils Absolute: 0 10*3/uL (ref 0.0–0.5)
Eosinophils Relative: 0 %
HCT: 22.7 % — ABNORMAL LOW (ref 36.0–46.0)
Hemoglobin: 7.4 g/dL — ABNORMAL LOW (ref 12.0–15.0)
Immature Granulocytes: 4 %
Lymphocytes Relative: 10 %
Lymphs Abs: 1.1 10*3/uL (ref 0.7–4.0)
MCH: 27.5 pg (ref 26.0–34.0)
MCHC: 32.6 g/dL (ref 30.0–36.0)
MCV: 84.4 fL (ref 80.0–100.0)
Monocytes Absolute: 1.2 10*3/uL — ABNORMAL HIGH (ref 0.1–1.0)
Monocytes Relative: 10 %
Neutro Abs: 9 10*3/uL — ABNORMAL HIGH (ref 1.7–7.7)
Neutrophils Relative %: 76 %
Platelets: 329 10*3/uL (ref 150–400)
RBC: 2.69 MIL/uL — ABNORMAL LOW (ref 3.87–5.11)
RDW: 17.6 % — ABNORMAL HIGH (ref 11.5–15.5)
WBC: 11.8 10*3/uL — ABNORMAL HIGH (ref 4.0–10.5)
nRBC: 0.6 % — ABNORMAL HIGH (ref 0.0–0.2)

## 2019-03-01 LAB — CULTURE, BLOOD (ROUTINE X 2)
Culture: NO GROWTH
Culture: NO GROWTH

## 2019-03-01 LAB — CBC
HCT: 27.7 % — ABNORMAL LOW (ref 36.0–46.0)
Hemoglobin: 8.9 g/dL — ABNORMAL LOW (ref 12.0–15.0)
MCH: 27.9 pg (ref 26.0–34.0)
MCHC: 32.1 g/dL (ref 30.0–36.0)
MCV: 86.8 fL (ref 80.0–100.0)
Platelets: 296 10*3/uL (ref 150–400)
RBC: 3.19 MIL/uL — ABNORMAL LOW (ref 3.87–5.11)
RDW: 17.7 % — ABNORMAL HIGH (ref 11.5–15.5)
WBC: 14.1 10*3/uL — ABNORMAL HIGH (ref 4.0–10.5)
nRBC: 0.3 % — ABNORMAL HIGH (ref 0.0–0.2)

## 2019-03-01 LAB — PROTIME-INR
INR: 2.5 — ABNORMAL HIGH (ref 0.8–1.2)
INR: 1.8 — ABNORMAL HIGH (ref 0.8–1.2)
Prothrombin Time: 27.1 seconds — ABNORMAL HIGH (ref 11.4–15.2)
Prothrombin Time: 21.1 seconds — ABNORMAL HIGH (ref 11.4–15.2)

## 2019-03-01 LAB — POCT I-STAT, CHEM 8
BUN: 13 mg/dL (ref 8–23)
Calcium, Ion: 1.21 mmol/L (ref 1.15–1.40)
Chloride: 97 mmol/L — ABNORMAL LOW (ref 98–111)
Creatinine, Ser: 0.9 mg/dL (ref 0.44–1.00)
Glucose, Bld: 128 mg/dL — ABNORMAL HIGH (ref 70–99)
HCT: 20 % — ABNORMAL LOW (ref 36.0–46.0)
Hemoglobin: 6.8 g/dL — CL (ref 12.0–15.0)
Potassium: 4.7 mmol/L (ref 3.5–5.1)
Sodium: 136 mmol/L (ref 135–145)
TCO2: 26 mmol/L (ref 22–32)

## 2019-03-01 LAB — GLUCOSE, CAPILLARY
Glucose-Capillary: 111 mg/dL — ABNORMAL HIGH (ref 70–99)
Glucose-Capillary: 133 mg/dL — ABNORMAL HIGH (ref 70–99)

## 2019-03-01 LAB — HEMOGLOBIN AND HEMATOCRIT, BLOOD
HCT: 20.5 % — ABNORMAL LOW (ref 36.0–46.0)
Hemoglobin: 6.8 g/dL — CL (ref 12.0–15.0)

## 2019-03-01 LAB — POCT I-STAT 7, (LYTES, BLD GAS, ICA,H+H)
Acid-Base Excess: 7 mmol/L — ABNORMAL HIGH (ref 0.0–2.0)
Bicarbonate: 29 mmol/L — ABNORMAL HIGH (ref 20.0–28.0)
Calcium, Ion: 1.09 mmol/L — ABNORMAL LOW (ref 1.15–1.40)
HCT: 18 % — ABNORMAL LOW (ref 36.0–46.0)
Hemoglobin: 6.1 g/dL — CL (ref 12.0–15.0)
O2 Saturation: 100 %
Potassium: 4.2 mmol/L (ref 3.5–5.1)
Sodium: 132 mmol/L — ABNORMAL LOW (ref 135–145)
TCO2: 30 mmol/L (ref 22–32)
pCO2 arterial: 29.7 mmHg — ABNORMAL LOW (ref 32.0–48.0)
pH, Arterial: 7.597 — ABNORMAL HIGH (ref 7.350–7.450)
pO2, Arterial: 166 mmHg — ABNORMAL HIGH (ref 83.0–108.0)

## 2019-03-01 LAB — PREPARE RBC (CROSSMATCH)

## 2019-03-01 SURGERY — AMPUTATION, ABOVE KNEE
Anesthesia: General | Site: Knee | Laterality: Right

## 2019-03-01 SURGERY — ECHOCARDIOGRAM, TRANSESOPHAGEAL
Anesthesia: Monitor Anesthesia Care

## 2019-03-01 MED ORDER — ORAL CARE MOUTH RINSE
15.0000 mL | OROMUCOSAL | Status: DC
  Administered 2019-03-01 – 2019-03-09 (×80): 15 mL via OROMUCOSAL

## 2019-03-01 MED ORDER — CEFAZOLIN SODIUM-DEXTROSE 2-4 GM/100ML-% IV SOLN
2.0000 g | INTRAVENOUS | Status: AC
  Administered 2019-03-01: 2 g via INTRAVENOUS
  Filled 2019-03-01: qty 100

## 2019-03-01 MED ORDER — CALCIUM CHLORIDE 10 % IV SOLN
INTRAVENOUS | Status: DC | PRN
  Administered 2019-03-01 (×2): 200 mg via INTRAVENOUS

## 2019-03-01 MED ORDER — NOREPINEPHRINE 4 MG/250ML-% IV SOLN
0.0000 ug/min | INTRAVENOUS | Status: DC
  Administered 2019-03-01: 2 ug/min via INTRAVENOUS
  Administered 2019-03-02: 40 ug/min via INTRAVENOUS
  Administered 2019-03-02: 38 ug/min via INTRAVENOUS
  Administered 2019-03-02: 40 ug/min via INTRAVENOUS
  Administered 2019-03-02: 15 ug/min via INTRAVENOUS
  Filled 2019-03-01 (×5): qty 250

## 2019-03-01 MED ORDER — SODIUM CHLORIDE 0.9% IV SOLUTION
Freq: Once | INTRAVENOUS | Status: AC
  Administered 2019-03-02: via INTRAVENOUS

## 2019-03-01 MED ORDER — PHENYLEPHRINE HCL-NACL 10-0.9 MG/250ML-% IV SOLN
INTRAVENOUS | Status: DC | PRN
  Administered 2019-03-01: 100 ug/min via INTRAVENOUS

## 2019-03-01 MED ORDER — CHLORHEXIDINE GLUCONATE 0.12% ORAL RINSE (MEDLINE KIT)
15.0000 mL | Freq: Two times a day (BID) | OROMUCOSAL | Status: DC
  Administered 2019-03-01 – 2019-03-29 (×52): 15 mL via OROMUCOSAL

## 2019-03-01 MED ORDER — DEXMEDETOMIDINE HCL IN NACL 200 MCG/50ML IV SOLN
INTRAVENOUS | Status: DC | PRN
  Administered 2019-03-01: .5 ug/kg/h via INTRAVENOUS

## 2019-03-01 MED ORDER — PROPOFOL 10 MG/ML IV BOLUS
INTRAVENOUS | Status: AC
  Filled 2019-03-01: qty 20

## 2019-03-01 MED ORDER — LIDOCAINE HCL (CARDIAC) PF 100 MG/5ML IV SOSY
PREFILLED_SYRINGE | INTRAVENOUS | Status: DC | PRN
  Administered 2019-03-01: 50 mg via INTRAVENOUS

## 2019-03-01 MED ORDER — FENTANYL CITRATE (PF) 100 MCG/2ML IJ SOLN
25.0000 ug | Freq: Once | INTRAMUSCULAR | Status: AC
  Administered 2019-03-01: 25 ug via INTRAVENOUS

## 2019-03-01 MED ORDER — SODIUM CHLORIDE 0.9% IV SOLUTION: Freq: Once | INTRAVENOUS | Status: DC

## 2019-03-01 MED ORDER — PHENYLEPHRINE HCL (PRESSORS) 10 MG/ML IV SOLN
INTRAVENOUS | Status: DC | PRN
  Administered 2019-03-01 (×2): 80 ug via INTRAVENOUS
  Administered 2019-03-01: 60 ug via INTRAVENOUS
  Administered 2019-03-01 (×3): 120 ug via INTRAVENOUS
  Administered 2019-03-01 (×4): 80 ug via INTRAVENOUS

## 2019-03-01 MED ORDER — PANTOPRAZOLE SODIUM 40 MG IV SOLR
40.0000 mg | Freq: Every day | INTRAVENOUS | Status: DC
  Administered 2019-03-01 – 2019-03-02 (×2): 40 mg via INTRAVENOUS
  Filled 2019-03-01 (×2): qty 40

## 2019-03-01 MED ORDER — ALBUMIN HUMAN 5 % IV SOLN
INTRAVENOUS | Status: DC | PRN
  Administered 2019-03-01 (×3): via INTRAVENOUS

## 2019-03-01 MED ORDER — 0.9 % SODIUM CHLORIDE (POUR BTL) OPTIME
TOPICAL | Status: DC | PRN
  Administered 2019-03-01: 1000 mL

## 2019-03-01 MED ORDER — DEXMEDETOMIDINE HCL IN NACL 400 MCG/100ML IV SOLN
0.0000 ug/kg/h | INTRAVENOUS | Status: AC
  Administered 2019-03-01: 0.5 ug/kg/h via INTRAVENOUS
  Administered 2019-03-02: 0.4 ug/kg/h via INTRAVENOUS
  Administered 2019-03-02 – 2019-03-04 (×7): 0.5 ug/kg/h via INTRAVENOUS
  Filled 2019-03-01 (×10): qty 100

## 2019-03-01 MED ORDER — LACTATED RINGERS IV BOLUS
1000.0000 mL | Freq: Once | INTRAVENOUS | Status: AC
  Administered 2019-03-01: 1000 mL via INTRAVENOUS

## 2019-03-01 MED ORDER — POVIDONE-IODINE 10 % EX SWAB: 2.0000 "application " | Freq: Once | CUTANEOUS | Status: DC

## 2019-03-01 MED ORDER — FENTANYL CITRATE (PF) 100 MCG/2ML IJ SOLN: 25.0000 ug | INTRAMUSCULAR | Status: DC | PRN

## 2019-03-01 MED ORDER — ENSURE PRE-SURGERY PO LIQD
296.0000 mL | Freq: Once | ORAL | Status: DC
  Filled 2019-03-01: qty 296

## 2019-03-01 MED ORDER — PROPOFOL 10 MG/ML IV BOLUS
INTRAVENOUS | Status: DC | PRN
  Administered 2019-03-01: 150 mg via INTRAVENOUS

## 2019-03-01 MED ORDER — FENTANYL BOLUS VIA INFUSION
25.0000 ug | INTRAVENOUS | Status: DC | PRN
  Filled 2019-03-01: qty 25

## 2019-03-01 MED ORDER — CHLORHEXIDINE GLUCONATE CLOTH 2 % EX PADS
6.0000 | MEDICATED_PAD | Freq: Every day | CUTANEOUS | Status: DC
  Administered 2019-03-01 – 2019-03-29 (×28): 6 via TOPICAL

## 2019-03-01 MED ORDER — FENTANYL CITRATE (PF) 250 MCG/5ML IJ SOLN
INTRAMUSCULAR | Status: DC | PRN
  Administered 2019-03-01: 75 ug via INTRAVENOUS
  Administered 2019-03-01: 25 ug via INTRAVENOUS

## 2019-03-01 MED ORDER — CHLORHEXIDINE GLUCONATE 4 % EX LIQD: 60.0000 mL | Freq: Once | CUTANEOUS | Status: DC

## 2019-03-01 MED ORDER — LACTATED RINGERS IV SOLN
INTRAVENOUS | Status: DC
  Administered 2019-03-01: 14:00:00 via INTRAVENOUS

## 2019-03-01 MED ORDER — FENTANYL 2500MCG IN NS 250ML (10MCG/ML) PREMIX INFUSION
25.0000 ug/h | INTRAVENOUS | Status: DC
  Administered 2019-03-01: 50 ug/h via INTRAVENOUS
  Administered 2019-03-03 – 2019-03-05 (×3): 75 ug/h via INTRAVENOUS
  Filled 2019-03-01 (×5): qty 250

## 2019-03-01 MED ORDER — VITAMIN K1 10 MG/ML IJ SOLN
2.5000 mg | Freq: Once | INTRAVENOUS | Status: AC
  Administered 2019-03-01: 2.5 mg via INTRAVENOUS
  Filled 2019-03-01: qty 0.25

## 2019-03-01 MED ORDER — SODIUM CHLORIDE 0.9 % IV SOLN
INTRAVENOUS | Status: DC | PRN
  Administered 2019-03-01: 14:00:00 via INTRAVENOUS

## 2019-03-01 MED ORDER — FENTANYL CITRATE (PF) 250 MCG/5ML IJ SOLN
INTRAMUSCULAR | Status: AC
  Filled 2019-03-01: qty 5

## 2019-03-01 MED ORDER — BISACODYL 10 MG RE SUPP: 10.0000 mg | Freq: Every day | RECTAL | Status: DC | PRN

## 2019-03-01 SURGICAL SUPPLY — 42 items
BLADE SAW RECIP 87.9 MT (BLADE) ×3 IMPLANT
BLADE SURG 21 STRL SS (BLADE) ×3 IMPLANT
BNDG COHESIVE 6X5 TAN STRL LF (GAUZE/BANDAGES/DRESSINGS) ×3 IMPLANT
CANISTER WOUND CARE 500ML ATS (WOUND CARE) ×2 IMPLANT
COVER SURGICAL LIGHT HANDLE (MISCELLANEOUS) ×3 IMPLANT
COVER WAND RF STERILE (DRAPES) IMPLANT
CUFF TOURN SGL QUICK 34 (TOURNIQUET CUFF)
CUFF TRNQT CYL 34X4.125X (TOURNIQUET CUFF) IMPLANT
DRAPE INCISE IOBAN 66X45 STRL (DRAPES) ×4 IMPLANT
DRAPE U-SHAPE 47X51 STRL (DRAPES) ×3 IMPLANT
DRESSING PREVENA PLUS CUSTOM (GAUZE/BANDAGES/DRESSINGS) ×1 IMPLANT
DRSG PREVENA PLUS CUSTOM (GAUZE/BANDAGES/DRESSINGS) ×3
DURAPREP 26ML APPLICATOR (WOUND CARE) ×3 IMPLANT
ELECT REM PT RETURN 9FT ADLT (ELECTROSURGICAL) ×3
ELECTRODE REM PT RTRN 9FT ADLT (ELECTROSURGICAL) ×1 IMPLANT
GLOVE BIOGEL PI IND STRL 7.5 (GLOVE) ×1 IMPLANT
GLOVE BIOGEL PI IND STRL 9 (GLOVE) ×1 IMPLANT
GLOVE BIOGEL PI INDICATOR 7.5 (GLOVE) ×2
GLOVE BIOGEL PI INDICATOR 9 (GLOVE) ×2
GLOVE SURG ORTHO 9.0 STRL STRW (GLOVE) ×3 IMPLANT
GLOVE SURG SS PI 6.5 STRL IVOR (GLOVE) ×3 IMPLANT
GOWN STRL REUS W/ TWL LRG LVL3 (GOWN DISPOSABLE) ×1 IMPLANT
GOWN STRL REUS W/ TWL XL LVL3 (GOWN DISPOSABLE) ×2 IMPLANT
GOWN STRL REUS W/TWL LRG LVL3 (GOWN DISPOSABLE) ×3
GOWN STRL REUS W/TWL XL LVL3 (GOWN DISPOSABLE) ×9
KIT BASIN OR (CUSTOM PROCEDURE TRAY) ×3 IMPLANT
KIT TURNOVER KIT B (KITS) ×3 IMPLANT
MANIFOLD NEPTUNE II (INSTRUMENTS) ×1 IMPLANT
NS IRRIG 1000ML POUR BTL (IV SOLUTION) ×3 IMPLANT
PACK ORTHO EXTREMITY (CUSTOM PROCEDURE TRAY) ×3 IMPLANT
PAD ARMBOARD 7.5X6 YLW CONV (MISCELLANEOUS) ×3 IMPLANT
PREVENA RESTOR ARTHOFORM 46X30 (CANNISTER) ×3 IMPLANT
SPONGE LAP 18X18 RF (DISPOSABLE) ×4 IMPLANT
STAPLER VISISTAT 35W (STAPLE) ×2 IMPLANT
STOCKINETTE IMPERVIOUS LG (DRAPES) ×2 IMPLANT
SUT ETHILON 2 0 PSLX (SUTURE) ×10 IMPLANT
SUT SILK 2 0 (SUTURE) ×3
SUT SILK 2-0 18XBRD TIE 12 (SUTURE) ×1 IMPLANT
TOWEL GREEN STERILE FF (TOWEL DISPOSABLE) ×3 IMPLANT
TUBE CONNECTING 20'X1/4 (TUBING) ×1
TUBE CONNECTING 20X1/4 (TUBING) ×2 IMPLANT
YANKAUER SUCT BULB TIP NO VENT (SUCTIONS) ×3 IMPLANT

## 2019-03-01 NOTE — Anesthesia Preprocedure Evaluation (Addendum)
Anesthesia Evaluation  Patient identified by MRN, date of birth, ID band Patient awake    Reviewed: Allergy & Precautions, NPO status , Patient's Chart, lab work & pertinent test results  Airway Mallampati: II  TM Distance: >3 FB     Dental   Pulmonary sleep apnea , former smoker,    breath sounds clear to auscultation       Cardiovascular hypertension, +CHF   Rhythm:Regular Rate:Normal     Neuro/Psych CVA    GI/Hepatic Neg liver ROS, GERD  ,  Endo/Other  diabetes  Renal/GU Renal disease     Musculoskeletal  (+) Arthritis ,   Abdominal   Peds  Hematology  (+) anemia ,   Anesthesia Other Findings   Reproductive/Obstetrics                            Anesthesia Physical Anesthesia Plan  ASA: III  Anesthesia Plan: General   Post-op Pain Management:    Induction: Intravenous  PONV Risk Score and Plan: 3 and Ondansetron, Dexamethasone and Midazolam  Airway Management Planned: LMA  Additional Equipment:   Intra-op Plan:   Post-operative Plan: Extubation in OR  Informed Consent: I have reviewed the patients History and Physical, chart, labs and discussed the procedure including the risks, benefits and alternatives for the proposed anesthesia with the patient or authorized representative who has indicated his/her understanding and acceptance.     Dental advisory given  Plan Discussed with: CRNA and Anesthesiologist  Anesthesia Plan Comments:         Anesthesia Quick Evaluation

## 2019-03-01 NOTE — Progress Notes (Signed)
ABG results reported to Dr. Lynetta Mare. Order to change VT to 6ml. Change made, pt tolerating well. RT will monitor

## 2019-03-01 NOTE — Progress Notes (Signed)
[facility] Progress Note Patient Name: AUDREENA SACHDEVA DOB: 02-Aug-1940 MRN: [id number]   Date of Service  03/01/2019  HPI/Events of Note  Hemoglobin 6.8 gm %  eICU Interventions  Transfuse 1 unit PRBC.        Kerry Kass Ogan 03/01/2019, 11:18 PM

## 2019-03-01 NOTE — Consult Note (Addendum)
NAME:  Robin Snow, MRN:  [id number], DOB:  06/05/1940, LOS: 6 ADMISSION DATE:  02/22/2019, CONSULTATION DATE:  11/25  REFERRING MD:  Margo Aye, CHIEF COMPLAINT:  Post op respiratory failure, s/p AKA for septic knee prosthesis   Brief History   78 yo F with R knee replacement, s/p revision in 2018, presenting with prosthetic dislocation and abscess. S/p closed reduction, drainage 11/18. Treating for e.coli bacteremia. Underwent R AKA 11/25 and was intubated after procedure for respiratory failure.  History of present illness   78 yo F PMH Afib on coumadin, HTN, DM2, CKD II-III, R TKR s/p revision arthroplasty in 2018, recent e.coli bacteremia (admitted 10/19-11/14), Sick sinus syndrome s/p pacemaker, OSA, Pulmonary HTN, who presented to ED 11/18 with CC R knee pain and swelling. Noted to have dislocation of R prosthetic and abscess. Underwent closed reduction and drainage with Ortho on 11/18. Began rocephin for e.coli bacteremia. ID following, Cardiology following for possible TEE. 11/24 patient complains of worsening pain, swelling blistering. Ortho recommends R AKA (source control, decrease pain). Patient/family agree and patient underwent R AKA 11/25 with ortho. Following procedure, patient intubated in PACU due to acute respiratory failure.   PCCM to assume care 11/25 as patient intubated and coming to ICU.   Past Medical History  Atrial Fib on coumadin HTN DM2 CKD II-III S/p R TKR, s/p revision arthroplasty in 2018 E. Coli bacteremia Sick sinus, s/p pacemaker Pulmonary HTN HLD GERD Obesity OSA  Significant Hospital Events   11/18 admitted. R knee closed reduction, drainage 11/21 ongoing tx for e coli bacteremia 11/23 PT rec dc to SNF when appropriate, family refuses. ID consulted for abx duration 11/24 ID recommend TEE, cardiology consulted. 11/25 R AKA. Reintubated following surgery. To ICU   Consults:  ID Ortho PCCM Cardiology  Procedures:  11/18> closed reduction of  R knee prosthetic, drainage 11/25 R AKA 11/25 ETT>>>   Significant Diagnostic Tests:  11/18 R knee plain film: Large R knee effusion, anterior dislocation of femoral component to tibial component   Micro Data:  11/18 SARS Cov2> neg  11/18 synovial fluid: Moderate E.Coli 11/18 BCx: e.coli 11/20 BCx> no growth to date   Antimicrobials:  Rocephin 11/19>>> (6 wk course)   Interim history/subjective:  Re-intubated following surgery   Objective   Blood pressure (!) 144/82, pulse (!) 103, temperature 98.4 F (36.9 C), temperature source Oral, resp. rate 16, height 5\' 7"  (1.702 m), weight 107 kg, SpO2 99 %.        Intake/Output Summary (Last 24 hours) at 03/01/2019 1701 Last data filed at 03/01/2019 1634 Gross per 24 hour  Intake 1065 ml  Output 750 ml  Net 315 ml   Filed Weights   02/24/19 1030 03/01/19 1343  Weight: 107 kg 107 kg    Examination: General: Chronically ill appearing older adult F, intubated, sedated, post-operative  HENT: NCAT. ETT secure. Pink mmm. Anicteric sclera Lungs: CTA, symmetrical chest expansion.  Cardiovascular: IRIR s1s2 no rgm. Cap refill < 3 seconds Abdomen: Soft, round, ndnt + bowel sounds  Extremities: R AKA with wound vac. LLE with muscle wasting. Symmetrical BUE bulk and tone.  Neuro: Sedated. Moving BUE, LLE spontaneously but is not following commands GU: defer   Resolved Hospital Problem list     Assessment & Plan:   Post-operative respiratory failure, requiring reintubation P -Continue MV support -CXR and ABG in ICU  -AM CXR -WUA/SBT  -VAP, PAD bundles   Chronic osteomyelitis of revised R total knee arthroplasty with abscess (  initially presented with prosthetic dislocation, abscess) -POD 7 R closed reduction of R knee prosthesis dislocation with aspiration of R knee abscess (op date 11/18) -POD 0 R AKA (op date 11/25)  P -per ortho   Shock -sepsis vs operative medications vs hypovolemic -11/25 EBC 850 ml, arrives  receiving 1 PRBC  -received neo intra-op  P -Will change neo to NE  -If Afib rate worsens on NE from neo, change back to neo -MAP goal > 65 -Cont IVF   Sepsis -recurring E.coli bacteremia  ---- BCx 11/20 no growth x 3 days  -chronic osteo/septic arthritis of R knee as above   --- e coli in synovial fluid  P -continue rocephin for 6 wk total course  -ID following, recommend TEE (concern for possible pacemaker involvement) -favor early discontinuation of CVC placed in OR in setting of bacteremia.   CKD II-III P -trend renal indices and electrolytes -Avoid NSAIDs post-op  Atrial Fibrillation -home coumadin P -continue tele  -rate controlled on dilt  Sick Sinus Syndrome -s/p pacemaker P -ICU monitoring   Anemia -anemia of chronic disease -acute blood loss anemia in setting of procedures, medication related coagulopathy  P -Check h/h post-transfusion   DM2 P SSI   Debilitation -SNF rec by PT/OT -Family has declined SNF per documentation and wants patient dispo home P -Cont PT/OT   Best practice:  Diet: NPO Pain/Anxiety/Delirium protocol (if indicated): Precedex, PRN fentanyl  VAP protocol (if indicated): yes DVT prophylaxis: SCD GI prophylaxis: Protonix  Glucose control: SSI Mobility: BR Code Status: Full Family Communication: pending Disposition: ICU postoperatively due to re-intubation   Labs   CBC: Recent Labs  Lab 02/25/19 0514 02/26/19 0650 02/27/19 0339 02/28/19 0243 03/01/19 0354 03/01/19 1151 03/01/19 1638  WBC 15.3* 13.6* 12.6* 11.5* 11.8* 14.1*  --   NEUTROABS 10.7* 9.8* 8.9* 7.6 9.0*  --   --   HGB 7.9* 7.8* 7.6* 7.5* 7.4* 8.9* 6.8*  HCT 24.3* 24.4* 23.6* 23.3* 22.7* 27.7* 20.0*  MCV 85.6 86.8 86.1 86.9 84.4 86.8  --   PLT 258 306 292 285 329 296  --     Basic Metabolic Panel: Recent Labs  Lab 02/23/19 0446 02/24/19 0003 02/25/19 0514 02/26/19 0650 02/27/19 0339 03/01/19 1638  NA 137 135 135 134* 134* 136  K 4.2 4.3 4.3  4.2 4.1 4.7  CL 103 101 100 100 99 97*  CO2 --   GLUCOSE 114* 104* 101* 98 92 128*  BUN CREATININE 1.15* 1.14* 1.12* 0.96 1.06* 0.90  CALCIUM 7.7* 7.9* 8.1* 8.1* 7.9*  --   MG  --  1.8  --   --   --   --   PHOS  --  2.3*  --   --   --   --    GFR: Estimated Creatinine Clearance: 63.9 mL/min (by C-G formula based on SCr of 0.9 mg/dL). Recent Labs  Lab 02/27/19 0339 02/28/19 0243 03/01/19 0354 03/01/19 1151  WBC 12.6* 11.5* 11.8* 14.1*    Liver Function Tests: No results for input(s): AST, ALT, ALKPHOS, BILITOT, PROT, ALBUMIN in the last 168 hours. No results for input(s): LIPASE, AMYLASE in the last 168 hours. No results for input(s): AMMONIA in the last 168 hours.  ABG    Component Value Date/Time   TCO2 26 03/01/2019 1638     Coagulation Profile: Recent Labs  Lab 02/25/19 0514 02/26/19 0650 02/27/19 0339 02/28/19 0243 03/01/19  0354  INR 1.7* 2.8* 3.6* 4.2* 2.5*    Cardiac Enzymes: No results for input(s): CKTOTAL, CKMB, CKMBINDEX, TROPONINI in the last 168 hours.  HbA1C: Hgb A1c MFr Bld  Date/Time Value Ref Range Status  02/03/2019 02:09 AM 5.8 (H) 4.8 - 5.6 % Final    Comment:    REPEATED TO VERIFY (NOTE) Pre diabetes:          5.7%-6.4% Diabetes:              >6.4% Glycemic control for   <7.0% adults with diabetes   04/13/2016 10:18 AM 6.3 (H) 4.8 - 5.6 % Final    Comment:    (NOTE)         Pre-diabetes: 5.7 - 6.4         Diabetes: >6.4         Glycemic control for adults with diabetes: <7.0     CBG: Recent Labs  Lab 02/28/19 0637 02/28/19 1148 02/28/19 1659 02/28/19 2114 03/01/19 1155  GLUCAP 90 143* 96 112* 111*    Review of Systems:   Unable to obtain, intubated sedated   Past Medical History  She,  has a past medical history of AKI (acute kidney injury) (HCC) (04/2016), Anemia, Arthritis, Chronic atrial fibrillation (HCC), Chronic edema, Chronic venous insufficiency, CKD (chronic kidney  disease), stage III, Coagulopathy (HCC), Diabetes mellitus, Diverticulosis, Dyslipidemia, GERD (gastroesophageal reflux disease), GI bleed (2015), transfusion of packed red blood cells, Hypertension, Morbid obesity (HCC), Obstructive sleep apnea, Pulmonary hypertension (HCC), Pyelonephritis (04/2016), Renal infarct [facility](HCC), Sinus bradycardia, and Stroke (HCC) (2015).   Surgical History    Past Surgical History:  Procedure Laterality Date  . CESAREAN SECTION    . COLONOSCOPY Left 01/24/2013   Procedure: COLONOSCOPY;  Surgeon: Willis ModenaWilliam Outlaw, MD;  [facility];  Service: Endoscopy;  Laterality: Left;  . ESOPHAGOGASTRODUODENOSCOPY Left 01/23/2013   Procedure: ESOPHAGOGASTRODUODENOSCOPY (EGD);  Surgeon: Willis ModenaWilliam Outlaw, MD;  [facility];  Service: Endoscopy;  Laterality: Left;  . EYE SURGERY Bilateral    cataract surgery  . JOINT REPLACEMENT    . KNEE CLOSED REDUCTION Right 02/22/2019   Procedure: CLOSED MANIPULATION KNEE;  Surgeon: Roby LoftsHaddix, Kevin P, MD;  Location: [facility] OR;  Service: Orthopedics;  Laterality: Right;  . PACEMAKER IMPLANT [name] 09/09/2016   Procedure: Pacemaker Implant;  Surgeon: Marinus Mawaylor, Gregg W, MD;  Location[facility] INVASIVE CV LAB;  Service: Cardiovascular;  Laterality: [name];  . REPLACEMENT TOTAL KNEE BILATERAL    . TUBAL LIGATION       Social History   reports that she quit smoking about 25 years ago. Her smoking use included cigarettes. She has a 4.08 pack-year smoking history. She has never used smokeless tobacco. She reports that she does not drink alcohol or use drugs.   Family History   Her family history includes Cancer in her father; Stroke in her maternal aunt.   Allergies Allergies  Allergen Reactions  . Aspirin Hives and Swelling    Angioedema   . Rofecoxib Hives  . Hydrocodone Hives    Tolerates oxycodone and tramadol     Home Medications  Prior to Admission medications   Medication Sig Start Date End Date Taking? Authorizing Provider   diltiazem (CARDIZEM CD) 120 MG 24 hr capsule Take 1 capsule (120 mg total) by mouth daily. 02/08/19  Yes Noralee Stainhoi, Jennifer, DO  ferrous sulfate 325 (65 FE) MG EC tablet TAKE ONE TABLET BY MOUTH TWICE DAILY Patient taking differently: Take 325 mg by mouth 2 (two) times daily.  05/22/16  Yes [name], MD  gabapentin (NEURONTIN) 300 MG capsule Take 1 capsule (300 mg total) by mouth 3 (three) times daily. Need appointment before next refill 09/16/18  Yes [name], MD  ibuprofen (ADVIL) 200 MG tablet Take 400 mg by mouth every 6 (six) hours as needed for headache or mild pain.   Yes [provider]  lovastatin (MEVACOR) 20 MG tablet Take 1 tablet (20 mg total) by mouth at bedtime. Need appointment for further refills 09/16/18  Yes [name], MD  metoprolol tartrate (LOPRESSOR) 25 MG tablet Take 1 tablet (25 mg total) by mouth 2 (two) times daily. 02/08/19  Yes Dessa Phi, DO  warfarin (COUMADIN) 5 MG tablet TAKE 1 TABLET BY MOUTH DAILY EXCEPT 1 & 1/2 TABLETS ON WEDNESDAY OR AS DIRECTED BY ANTICOAGULATION CLINIC Patient taking differently: Take 7.5-10 mg by mouth See admin instructions. Take 7.5mg  MON TUES THUR FRI, and 10mg  on WED SAT SUN. 09/16/18  Yes [name], MD     Critical care time: 40 minutes      Eliseo Gum MSN, AGACNP-BC [place] [phone] If no answer, [phone] 03/01/2019, 5:01 PM

## 2019-03-01 NOTE — Progress Notes (Signed)
    [facility] has been requested to perform a transesophageal echocardiogram on 03/01/2019 for bacteremia.  After careful review of history and examination, the risks and benefits of transesophageal echocardiogram have been explained including risks of esophageal damage, perforation (1:10,000 risk), bleeding, pharyngeal hematoma as well as other potential complications associated with conscious sedation including aspiration, arrhythmia, respiratory failure and death. Alternatives to treatment were discussed, questions were answered. Patient is willing to proceed.   Patient presented with recurrent bacteremia, TEE ordered to make sure she does not have infected pacemaker lead. BP stable, tachycardic overnight but receiving diltiazem CD 120mg  daily and metoprolol. Hemoglobin chronically low, but unchanged in the past 7 days.   Robin Snow, [place] 03/01/2019 9:38 AM

## 2019-03-01 NOTE — Telephone Encounter (Signed)
Jennifer from the [facility] is calling because patient has antibodies that they will have to order blood for this patients procedure. She needs to know how many units of blood Dr. Audie Box will need so she will know how much to order.

## 2019-03-01 NOTE — Progress Notes (Signed)
ANTICOAGULATION CONSULT NOTE - Follow Up Consult  Pharmacy Consult for warfarin Indication: atrial fibrillation  Allergies  Allergen Reactions  . Aspirin Hives and Swelling    Angioedema   . Rofecoxib Hives  . Hydrocodone Hives    Tolerates oxycodone and tramadol    Vital Signs: Temp: 98.8 F (37.1 C) (11/25 0500) Temp Source: Oral (11/25 0500) BP: 114/67 (11/25 0500) Pulse Rate: 89 (11/25 0500)  Labs: Recent Labs    02/27/19 0339 02/28/19 0243 03/01/19 0354  HGB 7.6* 7.5* 7.4*  HCT 23.6* 23.3* 22.7*  PLT 292 285 329  LABPROT 35.6* 40.5* 27.1*  INR 3.6* 4.2* 2.5*  CREATININE 1.06*  --   --     Estimated Creatinine Clearance: 54.2 mL/min (A) (by C-G formula based on SCr of 1.06 mg/dL (H)).  Assessment: 68 YOF on warfarin PTA for atrial fibrillation. On recent admission, pt was discharged 11/04 on warfarin 7.5 mg on Wednesdays and 5 mg on other days. However, pt's daughter reports she was taking 10mg  Wednesdays, Saturdays, and Sundays and 7.5mg  all other days. INR was elevated at 7.3 this admission. Warfarin was held, and Orthopedics ordered IV Vitamin K 5 mg x1 for reversal in ED on 11/18.   INR 2.5 today, to receive another vitamin K dose (2.5mg ) today for Ortho procedure planned this afternoon.    Goal of Therapy:  INR 2-3 Monitor platelets by anticoagulation protocol: Yes   Plan:  Continue to hold warfarin F/u Ortho plan and ability to start AC/need for bridge s/p   Bertis Ruddy, PharmD Clinical Pharmacist Please check AMION for all [place] numbers 03/01/2019 12:47 PM

## 2019-03-01 NOTE — Telephone Encounter (Signed)
Will route to MD to advise for TEE procedure.

## 2019-03-01 NOTE — Anesthesia Procedure Notes (Signed)
Procedure Name: Intubation Date/Time: 03/01/2019 4:14 PM Performed by: Mariea Clonts, CRNA Pre-anesthesia Checklist: Patient identified, Emergency Drugs available, Suction available and Patient being monitored Patient Re-evaluated:Patient Re-evaluated prior to induction Oxygen Delivery Method: Circle System Utilized Preoxygenation: Pre-oxygenation with 100% oxygen Induction Type: IV induction, Cricoid Pressure applied and Rapid sequence Laryngoscope Size: Mac and 3 Grade View: Grade I Tube type: Oral Tube size: 7.0 mm Number of attempts: 1 Airway Equipment and Method: Stylet and Oral airway Placement Confirmation: ETT inserted through vocal cords under direct vision,  positive ETCO2 and breath sounds checked- equal and bilateral Tube secured with: Tape Dental Injury: Teeth and Oropharynx as per pre-operative assessment

## 2019-03-01 NOTE — Progress Notes (Signed)
OT Cancellation Note  Patient Details Name: Robin Snow MRN: [id number] DOB: 1940/06/05   Cancelled Treatment:    Reason Eval/Treat Not Completed: Patient at procedure or test/ unavailable;Other (comment) pt off floor at surgery ( AKA). Will follow acutely and check back as time allows.  Lanier Clam., COTA/L Acute Rehabilitation Services [phone] [place] 03/01/2019, 2:18 PM

## 2019-03-01 NOTE — Progress Notes (Signed)
PT Cancellation Note  Patient Details Name: Robin Snow MRN: [id number] DOB: [birth year]   Cancelled Treatment:    Reason Eval/Treat Not Completed: Patient at procedure or test/unavailable. Pt off floor for surgery (AKA). PT will follow up as able and pt medically appropriate.   Kynzlee Hucker PT, DPT 1:25 PM,03/01/19   [place]ward Rylander M Bastien Strawser 03/01/2019, 1:24 PM

## 2019-03-01 NOTE — Anesthesia Procedure Notes (Signed)
Procedure Name: LMA Insertion Date/Time: 03/01/2019 3:21 PM Performed by: Mariea Clonts, CRNA Pre-anesthesia Checklist: Patient identified, Emergency Drugs available, Suction available and Patient being monitored Patient Re-evaluated:Patient Re-evaluated prior to induction Oxygen Delivery Method: Circle System Utilized Preoxygenation: Pre-oxygenation with 100% oxygen Induction Type: IV induction Ventilation: Mask ventilation without difficulty LMA: LMA inserted LMA Size: 4.0 Number of attempts: 1 Airway Equipment and Method: Bite block Placement Confirmation: positive ETCO2 Tube secured with: Tape Dental Injury: Teeth and Oropharynx as per pre-operative assessment

## 2019-03-01 NOTE — Progress Notes (Signed)
CRITICAL VALUE ALERT  Critical Value:   Hgb 6.8  Date & Time Notied:  03/01/2019 2145  Provider Notified: Dr Lucile Shutters  Orders Received/Actions taken: No new orders received yet.

## 2019-03-01 NOTE — Consult Note (Signed)
ORTHOPAEDIC CONSULTATION  REQUESTING PHYSICIAN: Darlin DropHall, Carole N, DO  Chief Complaint: Painful septic right total knee with draining abscess and blistered and cellulitic skin.  HPI: Robin Snow is a 78 y.o. female who presents with a dislocated right total knee.  She underwent initially close reduction with Dr. Jena GaussHaddix.  Patient has subsequently developed advanced cellulitis abscess ulceration draining and blisters around the total knee.  Patient's past medical history significant for chronic kidney disease type 2 diabetes and severe protein caloric malnutrition.  Past Medical History:  Diagnosis Date  . AKI (acute kidney injury) (HCC) 04/2016  . Anemia   . Arthritis   . Chronic atrial fibrillation (HCC)   . Chronic edema   . Chronic venous insufficiency   . CKD (chronic kidney disease), stage III   . Coagulopathy (HCC)   . Diabetes mellitus    Type 2  . Diverticulosis   . Dyslipidemia   . GERD (gastroesophageal reflux disease)   . GI bleed 2015   a. lower GI from diverticular source 2015.  [name]. Hx of transfusion of packed red blood cells   . Hypertension   . Morbid obesity (HCC)   . Obstructive sleep apnea    on CPAP  . Pulmonary hypertension (HCC)   . Pyelonephritis 04/2016  . Renal infarct (HCC)    a. 04/2016- adm with flank pain, ? pyelo vs renal infarct in setting of recent subtherapeutic INR.  [name]. Sinus bradycardia   . Stroke [facility](HCC) 2015   Past Surgical History:  Procedure Laterality Date  . CESAREAN SECTION    . COLONOSCOPY Left 01/24/2013   Procedure: COLONOSCOPY;  Surgeon: Willis ModenaWilliam Outlaw, MD;  [facility];  Service: Endoscopy;  Laterality: Left;  . ESOPHAGOGASTRODUODENOSCOPY Left 01/23/2013   Procedure: ESOPHAGOGASTRODUODENOSCOPY (EGD);  Surgeon: Willis ModenaWilliam Outlaw, MD;  [facility];  Service: Endoscopy;  Laterality: Left;  . EYE SURGERY Bilateral    cataract surgery  . JOINT REPLACEMENT    . KNEE CLOSED REDUCTION Right 02/22/2019   Procedure: CLOSED MANIPULATION KNEE;  Surgeon: Roby LoftsHaddix, Kevin P, MD;  Location: [facility] OR;  Service: Orthopedics;  Laterality: Right;  . PACEMAKER IMPLANT [name] 09/09/2016   Procedure: Pacemaker Implant;  Surgeon: Marinus Mawaylor, Gregg W, MD;  [facility];  Service: Cardiovascular;  Laterality: [name];  . REPLACEMENT TOTAL KNEE BILATERAL    . TUBAL LIGATION     Social History   Socioeconomic History  . Marital status: Widowed    Spouse name: Not on file  . Number of children: 4  . Years of education: 1212  . Highest education level: Not on file  Occupational History  . Not on file  Social Needs  . Financial resource strain: Not on file  . Food insecurity    Worry: Not on file    Inability: Not on file  . Transportation needs    Medical: Not on file    Non-medical: Not on file  Tobacco Use  . Smoking status: Former Smoker    Packs/day: 0.12    Years: 34.00    Pack years: 4.08    Types: Cigarettes    Quit date: 07/07/1993    Years since quitting: 25.6  . Smokeless tobacco: Never Used  Substance and Sexual Activity  . Alcohol use: No    Comment: no longer drinks alcohol  . Drug use: No  . Sexual activity: Never  Lifestyle  . Physical activity    Days per week: Not on file  Minutes per session: Not on file  . Stress: Not on file  Relationships  . Social Herbalist on phone: Not on file    Gets together: Not on file    Attends religious service: Not on file    Active member of club or organization: Not on file    Attends meetings of clubs or organizations: Not on file    Relationship status: Not on file  Other Topics Concern  . Not on file  Social History Narrative   Patient is widowed and has 4 living children, and 2 deceased children.   Patient is right handed.   Patient has hs education.   Patient drinks coffee and soda once a week.   Family History  Problem Relation Age of Onset  . Cancer Father   . Stroke Maternal Aunt    - negative except  otherwise stated in the family history section Allergies  Allergen Reactions  . Aspirin Hives and Swelling    Angioedema   . Rofecoxib Hives  . Hydrocodone Hives    Tolerates oxycodone and tramadol   Prior to Admission medications   Medication Sig Start Date End Date Taking? Authorizing Provider  diltiazem (CARDIZEM CD) 120 MG 24 hr capsule Take 1 capsule (120 mg total) by mouth daily. 02/08/19  Yes Dessa Phi, DO  ferrous sulfate 325 (65 FE) MG EC tablet TAKE ONE TABLET BY MOUTH TWICE DAILY Patient taking differently: Take 325 mg by mouth 2 (two) times daily.  05/22/16  Yes [name], MD  gabapentin (NEURONTIN) 300 MG capsule Take 1 capsule (300 mg total) by mouth 3 (three) times daily. Need appointment before next refill 09/16/18  Yes [name], MD  ibuprofen (ADVIL) 200 MG tablet Take 400 mg by mouth every 6 (six) hours as needed for headache or mild pain.   Yes [provider]  lovastatin (MEVACOR) 20 MG tablet Take 1 tablet (20 mg total) by mouth at bedtime. Need appointment for further refills 09/16/18  Yes [name], MD  metoprolol tartrate (LOPRESSOR) 25 MG tablet Take 1 tablet (25 mg total) by mouth 2 (two) times daily. 02/08/19  Yes Dessa Phi, DO  warfarin (COUMADIN) 5 MG tablet TAKE 1 TABLET BY MOUTH DAILY EXCEPT 1 & 1/2 TABLETS ON WEDNESDAY OR AS DIRECTED BY ANTICOAGULATION CLINIC Patient taking differently: Take 7.5-10 mg by mouth See admin instructions. Take 7.5mg  MON TUES THUR FRI, and 10mg  on WED SAT SUN. 09/16/18  Yes [name], MD   No results found. - pertinent xrays, CT, MRI studies were reviewed and independently interpreted  Positive ROS: All other systems have been reviewed and were otherwise negative with the exception of those mentioned in the HPI and as above.  Physical Exam: General: Alert, no acute distress Psychiatric: Patient is competent for consent with normal mood and affect Lymphatic: No  axillary or cervical lymphadenopathy Cardiovascular: No pedal edema Respiratory: No cyanosis, no use of accessory musculature GI: No organomegaly, abdomen is soft and non-tender    Images:  @ENCIMAGES @  Labs:  Lab Results  Component Value Date   HGBA1C 5.8 (H) 02/03/2019   HGBA1C 6.3 (H) 04/13/2016   HGBA1C 5.9 10/22/2015   ESRSEDRATE 78 (H) 02/02/2019   ESRSEDRATE 39 (H) 10/22/2015   CRP 19.7 (H) 02/02/2019   REPTSTATUS 03/01/2019 FINAL 02/24/2019   GRAMSTAIN  02/22/2019    ABUNDANT WBC PRESENT, PREDOMINANTLY PMN FEW GRAM POSITIVE RODS RARE GRAM NEGATIVE RODS RARE GRAM VARIABLE ROD  CULT  02/24/2019    NO GROWTH 5 DAYS Performed at [facility], [address] [place], [place] [address]    [facility] ESCHERICHIA COLI 02/22/2019    Lab Results  Component Value Date   ALBUMIN 2.2 (L) 02/04/2019   ALBUMIN 2.8 (L) 02/02/2019   ALBUMIN 3.4 (L) 03/08/2018    Neurologic: Patient does not have protective sensation bilateral lower extremities.   MUSCULOSKELETAL:   Skin: Examination patient has blisters around the knee secondary to the infection there is cellulitis that extends to the proximal aspect of her total knee incision this is tender to palpation.  There is a large ulcer that is draining at the level of the patella tendon.  Radiographs shows a previously dislocated revision total knee arthroplasty with lytic bony changes consistent with chronic osteomyelitis.    Patient has severe protein caloric malnutrition with an albumin of 2.2 hemoglobin A1c of 5.8 with a CRP of 19.7.  White cell count 14.1 hemoglobin 8.9, INR this morning of 2.5.  Assessment: Assessment diabetic insensate neuropathy with severe protein caloric malnutrition with chronic osteomyelitis of the revision right total knee arthroplasty with cellulitis blistering and draining abscess.  Plan: Plan: Discussed with the patient's daughter and the patient the need to proceed with an above-the-knee  amputation.  She does not have revision the options.  Patient does have a life and limb threatening infection and will plan for surgical intervention urgently.  Thank you for the consult and the opportunity to see Ms. Cristopher Estimable, MD [facility] [phone] 12:33 PM

## 2019-03-01 NOTE — Progress Notes (Addendum)
Per cardiology Almyra Deforest, PA-C) okay to give diltiazem 120mg  and metoprolol 25mg . Last blood pressure 112/54. Pt's HR sustaining in the 120's per tele monitor.

## 2019-03-01 NOTE — Progress Notes (Signed)
Robin Snow called back and stated "there is nothing we would do for her pacer during this type of surgery." CRNA made aware.

## 2019-03-01 NOTE — Anesthesia Postprocedure Evaluation (Signed)
Anesthesia Post Note  Patient: Robin Snow  Procedure(s) Performed: AMPUTATION ABOVE KNEE (Right Knee)     Patient location during evaluation: ICU Anesthesia Type: General Level of consciousness: patient remains intubated per anesthesia plan Pain management: pain level controlled Vital Signs Assessment: post-procedure vital signs reviewed and stable Respiratory status: patient remains intubated per anesthesia plan Cardiovascular status: stable Postop Assessment: no apparent nausea or vomiting Anesthetic complications: no    Last Vitals:  Vitals:   03/01/19 1730 03/01/19 1821  BP: (!) 91/45 103/71  Pulse:  (!) 107  Resp: (!) 21 16  Temp:  36.8 C  SpO2:  100%    Last Pain:  Vitals:   03/01/19 1821  TempSrc: Axillary  PainSc:                  Crue Otero

## 2019-03-01 NOTE — Anesthesia Procedure Notes (Signed)
Central Venous Catheter Insertion Performed by: anesthesiologist Start/End11/25/2020 4:20 PM, 03/01/2019 4:35 PM Patient location: OR. Preanesthetic checklist: patient identified, IV checked, site marked, risks and benefits discussed, surgical consent, monitors and equipment checked and timeout performed Position: Trendelenburg Lidocaine 1% used for infiltration and patient sedated Hand hygiene performed  and maximum sterile barriers used  Central line was placed.Double lumen Procedure performed using ultrasound guided technique. Ultrasound Notes:image(s) printed for medical record Attempts: 1 Following insertion, line sutured, dressing applied and Biopatch. Post procedure assessment: blood return through all ports  Patient tolerated the procedure well with no immediate complications.

## 2019-03-01 NOTE — Transfer of Care (Signed)
Immediate Anesthesia Transfer of Care Note  Patient: Robin Snow  Procedure(s) Performed: AMPUTATION ABOVE KNEE (Right Knee)  Patient Location: ICU  Anesthesia Type:General  Level of Consciousness: sedated and unresponsive  Airway & Oxygen Therapy: Patient remains intubated per anesthesia plan and Patient placed on Ventilator (see vital sign flow sheet for setting)  Post-op Assessment: Report given to RN and Post -op Vital signs reviewed and stable  Post vital signs: Reviewed and stable  Last Vitals:  Vitals Value Taken Time  BP 160/73 03/01/19 1715  Temp    Pulse 92 03/01/19 1716  Resp 16 03/01/19 1716  SpO2 100 % 03/01/19 1716  Vitals shown include unvalidated device data.  Last Pain:  Vitals:   03/01/19 1143  TempSrc: Oral  PainSc:       Patients Stated Pain Goal: 1 (31/49/70 2637)  Complications: No apparent anesthesia complications

## 2019-03-01 NOTE — Progress Notes (Signed)
Pt's dentures and diamond ring placed on top shelf in cabinet in pt's room. Pt had CHG bath prior to transport. Vitamin K IV and pre-surgical drink tubed to surgical.

## 2019-03-01 NOTE — Progress Notes (Signed)
[facility]  Date of Admission:  02/22/2019      Total days of antibiotics 8           ASSESSMENT: Robin Snow is a 78 y.o. female with recurrent Ecoli bacteremia and now with culture + aspiration from right prosthetic knee joint.   During assessment of knee she had a large eruption of blood from the anterior knee that was quite difficult to stop. Direct pressure held x 15 minutes. ABD pads and ACE wrap applied following partial hemostasis. Her nurse called orthopedic team to inform of situation. Dr. Margo Aye updated at the bedside as well. Will check CBC now. I think she may need further vitamin K to reduce INR vs FFP given acute bleed.   I called Cardiology to inform that we need to reschedule TEE - this will be moved to Friday 11/27  Dr. Jena Gauss offered definitive AKA however patient has not yet discussed with her daughter/family. We again discussed this with her today especially in light of the ongoing bleeding into her joint.     PLAN: 1. Continue Ceftriaxone IV  2. TEE 03/03/19  3. CBC now 4. NPO now with anticipation of urgent surgical intervention for bleeding control    Principal Problem:   Bacteremia due to Escherichia coli Active Problems:   Effusion of right knee joint   Diabetes mellitus with complication (HCC)   Anemia   Supratherapeutic INR   Sinus pause: 4.02-4.80sec per telemetry 09/08/2016   Sepsis (HCC)   Closed dislocation of right knee   AF (paroxysmal atrial fibrillation) (HCC)   Iron deficiency anemia due to chronic blood loss   . sodium chloride   Intravenous Once  . sodium chloride   Intravenous Once  . sodium chloride   Intravenous Once  . cholecalciferol  2,000 Units Oral BID  . diltiazem  120 mg Oral Daily  . ferrous sulfate  325 mg Oral BID  . gabapentin  300 mg Oral TID  . metoprolol tartrate  25 mg Oral BID  . pravastatin  20 mg Oral q1800  . senna-docusate  2 tablet Oral BID  . sodium chloride  flush  3 mL Intravenous Q12H  . Vitamin D (Ergocalciferol)  50,000 Units Oral Q7 days  . Warfarin - Pharmacist Dosing Inpatient   Does not apply q1800    SUBJECTIVE: Severe 10/10 pain in her right knee. Afebrile, persistent mild leukocytosis.   TEE is scheduled today to evaluate pacemaker leads.   Review of Systems: Review of Systems  Constitutional: Negative for chills, fever and malaise/fatigue.  Respiratory: Negative for cough and shortness of breath.   Gastrointestinal: Negative for abdominal pain, diarrhea and vomiting.  Musculoskeletal: Positive for joint pain. Negative for back pain.  Skin: Negative for itching and rash.  Neurological: Negative for weakness.    Allergies  Allergen Reactions  . Aspirin Hives and Swelling    Angioedema   . Rofecoxib Hives  . Hydrocodone Hives    Tolerates oxycodone and tramadol    OBJECTIVE: Vitals:   02/28/19 1947 03/01/19 0500 03/01/19 0824 03/01/19 1143  BP: 139/76 114/67 (!) 112/54 (!) 144/82  Pulse: 93 89 90 (!) 103  Resp: 15 16    Temp: 99.3 F (37.4 C) 98.8 F (37.1 C) 98.7 F (37.1 C) 98.4 F (36.9 C)  TempSrc: Oral Oral Oral Oral  SpO2: 97% 100% 99% 99%  Weight:      Height:  Body mass index is 36.95 kg/m.  Physical Exam Constitutional:      Appearance: She is not ill-appearing.  HENT:     Mouth/Throat:     Mouth: Mucous membranes are dry.  Eyes:     General: No scleral icterus.    Pupils: Pupils are equal, round, and reactive to light.  Cardiovascular:     Rate and Rhythm: Normal rate and regular rhythm.     Heart sounds: No murmur.     Comments: Left chest generator pocket unremarkable and non tender.  Pulmonary:     Effort: Pulmonary effort is normal.     Breath sounds: Normal breath sounds. No wheezing or rhonchi.  Abdominal:     General: Abdomen is flat. There is no distension.     Tenderness: There is no abdominal tenderness.  Musculoskeletal:        General: Swelling (R knee) and  tenderness (R knee) present.     Comments: Large erythematous/red indurated area extending up above the knee joint to the thigh. Upon removal of dressing she had a large eruption of bloody drainage from anterior knee.   Skin:    General: Skin is warm and dry.     Capillary Refill: Capillary refill takes less than 2 seconds.  Neurological:     Mental Status: She is alert and oriented to person, place, and time.  Psychiatric:     Comments: Tearful with pain      Lab Results Lab Results  Component Value Date   WBC 14.1 (H) 03/01/2019   HGB 8.9 (L) 03/01/2019   HCT 27.7 (L) 03/01/2019   MCV 86.8 03/01/2019   PLT 296 03/01/2019    Lab Results  Component Value Date   CREATININE 1.06 (H) 02/27/2019   BUN 12 02/27/2019   NA 134 (L) 02/27/2019   K 4.1 02/27/2019   CL 99 02/27/2019   CO2 27 02/27/2019    Lab Results  Component Value Date   ALT 13 02/04/2019   AST 15 02/04/2019   ALKPHOS 79 02/04/2019   BILITOT 0.9 02/04/2019     Microbiology: Recent Results (from the past 240 hour(s))  Blood culture (routine x 2)     Status: None   Collection Time: 02/22/19  7:59 AM   Specimen: BLOOD  Result Value Ref Range Status   Specimen Description BLOOD RIGHT ANTECUBITAL  Final   Special Requests   Final    BOTTLES DRAWN AEROBIC ONLY Blood Culture results may not be optimal due to an inadequate volume of blood received in culture bottles   Culture   Final    NO GROWTH 5 DAYS Performed at [facility], [address], [place], [place] [address]    Report Status 02/27/2019 FINAL  Final  Blood culture (routine x 2)     Status: Abnormal   Collection Time: 02/22/19  8:15 AM   Specimen: BLOOD  Result Value Ref Range Status   Specimen Description BLOOD LEFT ANTECUBITAL  Final   Special Requests   Final    BOTTLES DRAWN AEROBIC AND ANAEROBIC Blood Culture results may not be optimal due to an inadequate volume of blood received in culture bottles   Culture  Setup Time   Final     GRAM NEGATIVE RODS AEROBIC BOTTLE ONLY CRITICAL RESULT CALLED TO, READ BACK BY AND VERIFIED WITH: Lieutenant Diego [id number] 02/23/2019 Girtha Hake Performed at [facility], [address], [place], [place] [address]    Culture ESCHERICHIA  COLI (A)  Final   Report Status 02/25/2019 FINAL  Final   Organism ID, Bacteria ESCHERICHIA COLI  Final      Susceptibility   Escherichia coli - MIC*    AMPICILLIN >=32 RESISTANT Resistant     CEFAZOLIN <=4 SENSITIVE Sensitive     CEFEPIME <=1 SENSITIVE Sensitive     CEFTAZIDIME <=1 SENSITIVE Sensitive     CEFTRIAXONE <=1 SENSITIVE Sensitive     CIPROFLOXACIN <=0.25 SENSITIVE Sensitive     GENTAMICIN <=1 SENSITIVE Sensitive     IMIPENEM <=0.25 SENSITIVE Sensitive     TRIMETH/SULFA >=320 RESISTANT Resistant     AMPICILLIN/SULBACTAM >=32 RESISTANT Resistant     PIP/TAZO <=4 SENSITIVE Sensitive     Extended ESBL NEGATIVE Sensitive     * ESCHERICHIA COLI  Blood Culture ID Panel (Reflexed)     Status: Abnormal   Collection Time: 02/22/19  8:15 AM  Result Value Ref Range Status   Enterococcus species NOT DETECTED NOT DETECTED Final   Listeria monocytogenes NOT DETECTED NOT DETECTED Final   Staphylococcus species NOT DETECTED NOT DETECTED Final   Staphylococcus aureus (BCID) NOT DETECTED NOT DETECTED Final   Streptococcus species NOT DETECTED NOT DETECTED Final   Streptococcus agalactiae NOT DETECTED NOT DETECTED Final   Streptococcus pneumoniae NOT DETECTED NOT DETECTED Final   Streptococcus pyogenes NOT DETECTED NOT DETECTED Final   Acinetobacter baumannii NOT DETECTED NOT DETECTED Final   Enterobacteriaceae species DETECTED (A) NOT DETECTED Final    Comment: Enterobacteriaceae represent a large family of gram-negative bacteria, not a single organism. CRITICAL RESULT CALLED TO, READ BACK BY AND VERIFIED WITH: G. ABBOTT,PHARMD [id number] 02/23/2019 T. TYSOR    Enterobacter cloacae complex NOT DETECTED NOT DETECTED Final   Escherichia coli DETECTED (A)  NOT DETECTED Final    Comment: CRITICAL RESULT CALLED TO, READ BACK BY AND VERIFIED WITH: G. ABBOTT,PHARMD [id number] 02/23/2019 T. TYSOR    Klebsiella oxytoca NOT DETECTED NOT DETECTED Final   Klebsiella pneumoniae NOT DETECTED NOT DETECTED Final   Proteus species NOT DETECTED NOT DETECTED Final   Serratia marcescens NOT DETECTED NOT DETECTED Final   Carbapenem resistance NOT DETECTED NOT DETECTED Final   Haemophilus influenzae NOT DETECTED NOT DETECTED Final   Neisseria meningitidis NOT DETECTED NOT DETECTED Final   Pseudomonas aeruginosa NOT DETECTED NOT DETECTED Final   Candida albicans NOT DETECTED NOT DETECTED Final   Candida glabrata NOT DETECTED NOT DETECTED Final   Candida krusei NOT DETECTED NOT DETECTED Final   Candida parapsilosis NOT DETECTED NOT DETECTED Final   Candida tropicalis NOT DETECTED NOT DETECTED Final    Comment: Performed at [facility], [address], [place], [place] [address]  SARS Coronavirus 2 by RT PCR (hospital order, performed in [facility] hospital lab) Nasopharyngeal Nasopharyngeal Swab     Status: None   Collection Time: 02/22/19  9:43 AM   Specimen: Nasopharyngeal Swab  Result Value Ref Range Status   SARS Coronavirus 2 NEGATIVE NEGATIVE Final    Comment: (NOTE) If result is NEGATIVE SARS-CoV-2 target nucleic acids are NOT DETECTED. The SARS-CoV-2 RNA is generally detectable in upper and lower  respiratory specimens during the acute phase of infection. The lowest  concentration of SARS-CoV-2 viral copies this assay can detect is 250  copies / mL. A negative result does not preclude SARS-CoV-2 infection  and should not be used as the sole basis for treatment or other  patient management decisions.  A negative result may occur with  improper specimen  collection / handling, submission of specimen other  than nasopharyngeal swab, presence of viral mutation(s) within the  areas targeted by this assay, and inadequate number of viral copies  (<250  copies / mL). A negative result must be combined with clinical  observations, patient history, and epidemiological information. If result is POSITIVE SARS-CoV-2 target nucleic acids are DETECTED. The SARS-CoV-2 RNA is generally detectable in upper and lower  respiratory specimens dur ing the acute phase of infection.  Positive  results are indicative of active infection with SARS-CoV-2.  Clinical  correlation with patient history and other diagnostic information is  necessary to determine patient infection status.  Positive results do  not rule out bacterial infection or co-infection with other viruses. If result is PRESUMPTIVE POSTIVE SARS-CoV-2 nucleic acids MAY BE PRESENT.   A presumptive positive result was obtained on the submitted specimen  and confirmed on repeat testing.  While 2019 novel coronavirus  (SARS-CoV-2) nucleic acids may be present in the submitted sample  additional confirmatory testing may be necessary for epidemiological  and / or clinical management purposes  to differentiate between  SARS-CoV-2 and other Sarbecovirus currently known to infect humans.  If clinically indicated additional testing with an alternate test  methodology [phone]) is advised. The SARS-CoV-2 RNA is generally  detectable in upper and lower respiratory sp ecimens during the acute  phase of infection. The expected result is Negative. Fact Sheet for Patients:  StrictlyIdeas.no Fact Sheet for Healthcare Providers: BankingDealers.co.za This test is not yet approved or cleared by the [place] FDA and has been authorized for detection and/or diagnosis of SARS-CoV-2 by FDA under an Emergency Use Authorization (EUA).  This EUA will remain in effect (meaning this test can be used) for the duration of the COVID-19 declaration under Section 564(b)(1) of the Act, 21 U.S.C. section 360bbb-3(b)(1), unless the authorization is terminated or revoked  sooner. Performed at [facility], [place] [address] [place] [address], [place], [place] [address]   Aerobic/Anaerobic Culture (surgical/deep wound)     Status: None   Collection Time: 02/22/19  2:01 PM   Specimen: Synovial, Right Knee; Body Fluid  Result Value Ref Range Status   Specimen Description SYNOVIAL RIGHT KNEE  Final   Special Requests NONE  Final   Gram Stain   Final    ABUNDANT WBC PRESENT, PREDOMINANTLY PMN FEW GRAM POSITIVE RODS RARE GRAM NEGATIVE RODS RARE GRAM VARIABLE ROD    Culture   Final    MODERATE ESCHERICHIA COLI NO ANAEROBES ISOLATED Performed at [facility], [address] [place], [place] [address]    Report Status 02/26/2019 FINAL  Final   Organism ID, Bacteria ESCHERICHIA COLI  Final      Susceptibility   Escherichia coli - MIC*    AMPICILLIN >=32 RESISTANT Resistant     CEFAZOLIN <=4 SENSITIVE Sensitive     CEFEPIME <=1 SENSITIVE Sensitive     CEFTAZIDIME <=1 SENSITIVE Sensitive     CEFTRIAXONE <=1 SENSITIVE Sensitive     CIPROFLOXACIN <=0.25 SENSITIVE Sensitive     GENTAMICIN <=1 SENSITIVE Sensitive     IMIPENEM <=0.25 SENSITIVE Sensitive     TRIMETH/SULFA >=320 RESISTANT Resistant     AMPICILLIN/SULBACTAM >=32 RESISTANT Resistant     PIP/TAZO <=4 SENSITIVE Sensitive     Extended ESBL NEGATIVE Sensitive     * MODERATE ESCHERICHIA COLI  Culture, blood (routine x 2)     Status: None   Collection Time: 02/23/19 11:57 PM   Specimen: BLOOD  Result  Value Ref Range Status   Specimen Description BLOOD LEFT ANTECUBITAL  Final   Special Requests   Final    BOTTLES DRAWN AEROBIC AND ANAEROBIC Blood Culture results may not be optimal due to an inadequate volume of blood received in culture bottles   Culture   Final    NO GROWTH 5 DAYS Performed at [facility], [address] [place], [place] [address]    Report Status 03/01/2019 FINAL  Final  Culture, blood (routine x 2)     Status: None   Collection Time: 02/24/19 12:03 AM   Specimen: BLOOD LEFT  WRIST  Result Value Ref Range Status   Specimen Description BLOOD LEFT WRIST  Final   Special Requests   Final    BOTTLES DRAWN AEROBIC ONLY Blood Culture results may not be optimal due to an inadequate volume of blood received in culture bottles   Culture   Final    NO GROWTH 5 DAYS Performed at [facility], [address] [place], [place] [address]    Report Status 03/01/2019 FINAL  Final    Rexene AlbertsStephanie Elloise Roark, MSN, NP-C [facility]  [email or web address] Pager: [phone] Office: [phone] [facility] Main Line: [phone]

## 2019-03-01 NOTE — Progress Notes (Signed)
Dr. Nyoka Cowden notified of patient's pacemaker, asked RN to call [employer] pacermaker rep prior to patient's upcoming surgery. [employer] rep paged.

## 2019-03-01 NOTE — Addendum Note (Signed)
Addendum  created 03/01/19 1857 by Belinda Block, MD   Intraprocedure Event edited

## 2019-03-01 NOTE — Progress Notes (Signed)
PROGRESS NOTE  Robin Snow OZH:[id number] DOB: [birth year] DOA: 02/22/2019 PCP: [name], MD  HPI/Recap of past 50 hours: 78 year old female with history of paroxysmal A. fib on anticoagulation with Coumadin, hypertension, type 2 diabetes, stage III chronic kidney disease, prosthetic right knee with revision arthroplasty and 2018, recent admission for E. coli bacteremia presented to the emergency room with complaints of worsening right knee pain and swelling.  Patient hospitalized at [facility] from 10/19-11/14 with sepsis related to E. coli bacteria, UTI and bacterial arthritis history of the right knee.  She was treated with 2 weeks of antibiotics.  Right knee was evaluated and thought to be with no evidence of infection.  In the emergency room, patient was afebrile.  Tachycardic.  Blood pressure is stable.  WBC count 14.9.  Hemoglobin 8.1.  INR 7.3.  Swollen right knee.  X-ray showed large effusion and anterior dislocation.  Admitted with orthopedic consultation.  11/21: Ongoing treatment for recurrent E. coli bacteremia. Leukocytosis improving.  11/22: More alert and interactive.  Sepsis physiology is improving.  Leukocytosis is trending down.  Repeated blood culture x2 peripherally negative to date.  11/23: Infectious disease consulted due to E. coli septic arthritis with hardware to guide antibiotics coverage and duration for DC planning.    Evaluated by PT OT recommendation for SNF with 24-hour supervision/assistance.  Family declines SNF and would prefer to take her home.  11/24: Patient was seen and examined at her bedside this morning.  She reports significant pain in her right knee.  Orthopedic surgery following, appreciate expertise.  Infectious disease following with recommendation for TEE due to concern for pacemaker involvement in the setting of recurrent E. coli bacteremia.  Cardiology has been consulted for possible TEE.  03/01/19: patient was seen and  examined at her bedside. Bleeding noted from her Right knee. Orthopedic surgery plans for R knee wash out. INR 2.5, IV vitamin K 2.5 mg once ordered to be administered as recommended by Dr. Jena Gauss. Will repeat INR this afternoon.   Assessment/Plan: Principal Problem:   Bacteremia due to Escherichia coli Active Problems:   Diabetes mellitus with complication (HCC)   Anemia   Supratherapeutic INR   Sinus pause: 4.02-4.80sec per telemetry 09/08/2016   Sepsis (HCC)   Closed dislocation of right knee   Effusion of right knee joint   AF (paroxysmal atrial fibrillation) (HCC)   Iron deficiency anemia due to chronic blood loss  Sepsis present on admission, recurrent E. coli bacteremia, chronic infected right knee with dislocated prosthetic knee: Sepsis physiology improving WBC improving from 16,000-15,000>> 13,000>> 12,000> 11,000 Blood culture x2 drawn on 02/22/2019 + for E. coli sensitivities show resistance to ampicillin, Unasyn and Bactrim Continue Rocephin, will need 6 weeks of IV antibiotics total. Repeated blood cultures drawn on 02/24/2019 no growth x3 days Continue to follow cultures until finalized. Hold off PICC line until cultures are finalized and okay with infectious disease to proceed. Underwent closed reduction and aspiration of the right knee, E Coli on aspirate, no frank purulence.    Orthopedic surgery following.  Appreciate expertise.  Right knee synovial fluid + E. coli resistant to ampicillin, Unasyn and Bactrim. Continue Rocephin  Appreciate infectious disease assistance.  Acute blood loss anemia from R knee in the setting of anticoagulation INR therapeutic 2.5, bleeding from R knee Continue to Monitor H&H 2 U PRBCs ordered to be transfused. Patient has antibodies that will need to be matched  Right with active bleeding Plan for wash out  in the OR today by orthopedic surgery Dr. Jena GaussHaddix Reverse coumadin Received po 2.5 mg vitamin K yesterday Add IV vitamin K 2.5mg   once as recommended by orthopedic surgery  Recurrent E. coli bacteremia Infectious disease recommends TEE due to recurrent E. coli bacteremia with concern for pacemaker involvement Cardiology consulted on 02/28/2019 for possible TEE  E. coli right knee septic arthritis with hardware Synovial fluid grew E. coli Management as per above ID consulted on 02/27/19 to guide with coverage and duration of abx Continue pain management with IV morphine for severe pain as needed; oxycodone as needed for moderate pain and Tylenol as needed for mild pain.  Continue bowel regimen for opiate-induced constipation. Management per infectious disease and orthopedic surgery.  Anemia of chronic disease: Aggravated by recent procedures and hospitalizations.   Management as stated above  Paroxysmal A. fib: Rate is controlled on oral diltiazem.  On Coumadin for CVA prevention.    Sick sinus syndrome status post pacemaker placement:  Stable.  CKD stage II: Baseline creatinine appears to be 0.9 with GFR greater than 60.  Creatinine 1.0 with GFR of 58 on 02/27/2019.  Continue to avoid nephrotoxins like NSAIDs and hypotension.  Monitor urine output and continue to monitor renal function  Type 2 diabetes: Diet controlled at home.  Remains on sliding scale in the hospital.  Avoid hypoglycemia.  Hemoglobin A1c 5.8 on 02/03/2019.  Avoid hypoglycemia.  Ambulatory dysfunction/physical debility PT OT recs SNF. Family declines SNF, they prefer to bring her home. Continue PT OT with assistance and fall precautions.   DVT prophylaxis: Coumadin on hold due to supratherapeutic INR. Code Status: Full code Family Communication:  Will update daughter on 03/01/2019. Disposition Plan:  Anticipate discharge when infectious disease and orthopedic surgery sign off.  Consultants:   Orthopedic surgery  Infectious disease  Procedures:   Close reduction right knee, aspiration right knee  Antimicrobials:    Rocephin, 02/23/2019---    Objective: Vitals:   02/28/19 1947 03/01/19 0500 03/01/19 0824 03/01/19 1143  BP: 139/76 114/67 (!) 112/54 (!) 144/82  Pulse: 93 89 90 (!) 103  Resp: 15 16    Temp: 99.3 F (37.4 C) 98.8 F (37.1 C) 98.7 F (37.1 C) 98.4 F (36.9 C)  TempSrc: Oral Oral Oral Oral  SpO2: 97% 100% 99% 99%  Weight:      Height:        Intake/Output Summary (Last 24 hours) at 03/01/2019 1217 Last data filed at 02/28/2019 1550 Gross per 24 hour  Intake 222 ml  Output 275 ml  Net -53 ml   Filed Weights   02/24/19 1030  Weight: 107 kg    Exam:   General: 78 y.o. year-old female Obese, not in distress. Somnolent but arouses to voices.  Cardiovascular: RRR no rubs or gallops.  Respiratory: CTA no wheezes or rales  Abdomen: Obese NT ND NBS  Musculoskeletal: R knee bleeding posteriorly  Psychiatry: Unable to assess mood due to somnolence.   Data Reviewed: CBC: Recent Labs  Lab 02/25/19 0514 02/26/19 0650 02/27/19 0339 02/28/19 0243 03/01/19 0354 03/01/19 1151  WBC 15.3* 13.6* 12.6* 11.5* 11.8* 14.1*  NEUTROABS 10.7* 9.8* 8.9* 7.6 9.0*  --   HGB 7.9* 7.8* 7.6* 7.5* 7.4* 8.9*  HCT 24.3* 24.4* 23.6* 23.3* 22.7* 27.7*  MCV 85.6 86.8 86.1 86.9 84.4 86.8  PLT 258 306 292 285 329 296   Basic Metabolic Panel: Recent Labs  Lab 02/23/19 0446 02/24/19 0003 02/25/19 0514 02/26/19 0650 02/27/19 0339  NA 137  135 135 134* 134*  K 4.2 4.3 4.3 4.2 4.1  CL 103 101 100 100 99  CO2 27 24 24 26 27   GLUCOSE 114* 104* 101* 98 92  BUN 10 15 15 14 12   CREATININE 1.15* 1.14* 1.12* 0.96 1.06*  CALCIUM 7.7* 7.9* 8.1* 8.1* 7.9*  MG  --  1.8  --   --   --   PHOS  --  2.3*  --   --   --    GFR: Estimated Creatinine Clearance: 54.2 mL/min (A) (by C-G formula based on SCr of 1.06 mg/dL (H)). Liver Function Tests: No results for input(s): AST, ALT, ALKPHOS, BILITOT, PROT, ALBUMIN in the last 168 hours. No results for input(s): LIPASE, AMYLASE in the last 168  hours. No results for input(s): AMMONIA in the last 168 hours. Coagulation Profile: Recent Labs  Lab 02/25/19 0514 02/26/19 0650 02/27/19 0339 02/28/19 0243 03/01/19 0354  INR 1.7* 2.8* 3.6* 4.2* 2.5*   Cardiac Enzymes: No results for input(s): CKTOTAL, CKMB, CKMBINDEX, TROPONINI in the last 168 hours. BNP (last 3 results) No results for input(s): PROBNP in the last 8760 hours. HbA1C: No results for input(s): HGBA1C in the last 72 hours. CBG: Recent Labs  Lab 02/28/19 0637 02/28/19 1148 02/28/19 1659 02/28/19 2114 03/01/19 1155  GLUCAP 90 143* 96 112* 111*   Lipid Profile: No results for input(s): CHOL, HDL, LDLCALC, TRIG, CHOLHDL, LDLDIRECT in the last 72 hours. Thyroid Function Tests: No results for input(s): TSH, T4TOTAL, FREET4, T3FREE, THYROIDAB in the last 72 hours. Anemia Panel: No results for input(s): VITAMINB12, FOLATE, FERRITIN, TIBC, IRON, RETICCTPCT in the last 72 hours. Urine analysis:    Component Value Date/Time   COLORURINE YELLOW 02/02/2019 1923   APPEARANCEUR CLEAR 02/02/2019 1923   LABSPEC 1.013 02/02/2019 1923   PHURINE 5.0 02/02/2019 1923   GLUCOSEU NEGATIVE 02/02/2019 1923   GLUCOSEU NEGATIVE 09/06/2017 1200   HGBUR SMALL (A) 02/02/2019 1923   BILIRUBINUR NEGATIVE 02/02/2019 1923   BILIRUBINUR Neg 10/18/2017 1453   KETONESUR NEGATIVE 02/02/2019 1923   PROTEINUR NEGATIVE 02/02/2019 1923   UROBILINOGEN 0.2 10/18/2017 1453   UROBILINOGEN 1.0 09/06/2017 1200   NITRITE POSITIVE (A) 02/02/2019 1923   LEUKOCYTESUR LARGE (A) 02/02/2019 1923   Sepsis Labs: @LABRCNTIP (procalcitonin:4,lacticidven:4)  ) Recent Results (from the past 240 hour(s))  Blood culture (routine x 2)     Status: None   Collection Time: 02/22/19  7:59 AM   Specimen: BLOOD  Result Value Ref Range Status   Specimen Description BLOOD RIGHT ANTECUBITAL  Final   Special Requests   Final    BOTTLES DRAWN AEROBIC ONLY Blood Culture results may not be optimal due to an  inadequate volume of blood received in culture bottles   Culture   Final    NO GROWTH 5 DAYS Performed at [facility], [address] [place], [facility] [address]    Report Status 02/27/2019 FINAL  Final  Blood culture (routine x 2)     Status: Abnormal   Collection Time: 02/22/19  8:15 AM   Specimen: BLOOD  Result Value Ref Range Status   Specimen Description BLOOD LEFT ANTECUBITAL  Final   Special Requests   Final    BOTTLES DRAWN AEROBIC AND ANAEROBIC Blood Culture results may not be optimal due to an inadequate volume of blood received in culture bottles   Culture  Setup Time   Final    GRAM NEGATIVE RODS AEROBIC BOTTLE ONLY CRITICAL RESULT CALLED TO, READ BACK  BY AND VERIFIED WITH: Lieutenant Diego [id number] 02/23/2019 Girtha Hake Performed at [facility], [address], [place], [place] [address]    Culture ESCHERICHIA COLI (A)  Final   Report Status 02/25/2019 FINAL  Final   Organism ID, Bacteria ESCHERICHIA COLI  Final      Susceptibility   Escherichia coli - MIC*    AMPICILLIN >=32 RESISTANT Resistant     CEFAZOLIN <=4 SENSITIVE Sensitive     CEFEPIME <=1 SENSITIVE Sensitive     CEFTAZIDIME <=1 SENSITIVE Sensitive     CEFTRIAXONE <=1 SENSITIVE Sensitive     CIPROFLOXACIN <=0.25 SENSITIVE Sensitive     GENTAMICIN <=1 SENSITIVE Sensitive     IMIPENEM <=0.25 SENSITIVE Sensitive     TRIMETH/SULFA >=320 RESISTANT Resistant     AMPICILLIN/SULBACTAM >=32 RESISTANT Resistant     PIP/TAZO <=4 SENSITIVE Sensitive     Extended ESBL NEGATIVE Sensitive     * ESCHERICHIA COLI  Blood Culture ID Panel (Reflexed)     Status: Abnormal   Collection Time: 02/22/19  8:15 AM  Result Value Ref Range Status   Enterococcus species NOT DETECTED NOT DETECTED Final   Listeria monocytogenes NOT DETECTED NOT DETECTED Final   Staphylococcus species NOT DETECTED NOT DETECTED Final   Staphylococcus aureus (BCID) NOT DETECTED NOT DETECTED Final   Streptococcus species NOT DETECTED NOT  DETECTED Final   Streptococcus agalactiae NOT DETECTED NOT DETECTED Final   Streptococcus pneumoniae NOT DETECTED NOT DETECTED Final   Streptococcus pyogenes NOT DETECTED NOT DETECTED Final   Acinetobacter baumannii NOT DETECTED NOT DETECTED Final   Enterobacteriaceae species DETECTED (A) NOT DETECTED Final    Comment: Enterobacteriaceae represent a large family of gram-negative bacteria, not a single organism. CRITICAL RESULT CALLED TO, READ BACK BY AND VERIFIED WITH: G. ABBOTT,PHARMD 0328 02/23/2019 T. TYSOR    Enterobacter cloacae complex NOT DETECTED NOT DETECTED Final   Escherichia coli DETECTED (A) NOT DETECTED Final    Comment: CRITICAL RESULT CALLED TO, READ BACK BY AND VERIFIED WITH: G. ABBOTT,PHARMD 0328 02/23/2019 T. TYSOR    Klebsiella oxytoca NOT DETECTED NOT DETECTED Final   Klebsiella pneumoniae NOT DETECTED NOT DETECTED Final   Proteus species NOT DETECTED NOT DETECTED Final   Serratia marcescens NOT DETECTED NOT DETECTED Final   Carbapenem resistance NOT DETECTED NOT DETECTED Final   Haemophilus influenzae NOT DETECTED NOT DETECTED Final   Neisseria meningitidis NOT DETECTED NOT DETECTED Final   Pseudomonas aeruginosa NOT DETECTED NOT DETECTED Final   Candida albicans NOT DETECTED NOT DETECTED Final   Candida glabrata NOT DETECTED NOT DETECTED Final   Candida krusei NOT DETECTED NOT DETECTED Final   Candida parapsilosis NOT DETECTED NOT DETECTED Final   Candida tropicalis NOT DETECTED NOT DETECTED Final    Comment: Performed at [facility], [address] [place], [place] [address]  SARS Coronavirus 2 by RT PCR (hospital order, performed in [facility] hospital lab) Nasopharyngeal Nasopharyngeal Swab     Status: None   Collection Time: 02/22/19  9:43 AM   Specimen: Nasopharyngeal Swab  Result Value Ref Range Status   SARS Coronavirus 2 NEGATIVE NEGATIVE Final    Comment: (NOTE) If result is NEGATIVE SARS-CoV-2 target nucleic acids are NOT DETECTED. The  SARS-CoV-2 RNA is generally detectable in upper and lower  respiratory specimens during the acute phase of infection. The lowest  concentration of SARS-CoV-2 viral copies this assay can detect is 250  copies / mL. A negative result does not preclude SARS-CoV-2  infection  and should not be used as the sole basis for treatment or other  patient management decisions.  A negative result may occur with  improper specimen collection / handling, submission of specimen other  than nasopharyngeal swab, presence of viral mutation(s) within the  areas targeted by this assay, and inadequate number of viral copies  (<250 copies / mL). A negative result must be combined with clinical  observations, patient history, and epidemiological information. If result is POSITIVE SARS-CoV-2 target nucleic acids are DETECTED. The SARS-CoV-2 RNA is generally detectable in upper and lower  respiratory specimens dur ing the acute phase of infection.  Positive  results are indicative of active infection with SARS-CoV-2.  Clinical  correlation with patient history and other diagnostic information is  necessary to determine patient infection status.  Positive results do  not rule out bacterial infection or co-infection with other viruses. If result is PRESUMPTIVE POSTIVE SARS-CoV-2 nucleic acids MAY BE PRESENT.   A presumptive positive result was obtained on the submitted specimen  and confirmed on repeat testing.  While 2019 novel coronavirus  (SARS-CoV-2) nucleic acids may be present in the submitted sample  additional confirmatory testing may be necessary for epidemiological  and / or clinical management purposes  to differentiate between  SARS-CoV-2 and other Sarbecovirus currently known to infect humans.  If clinically indicated additional testing with an alternate test  methodology [phone]) is advised. The SARS-CoV-2 RNA is generally  detectable in upper and lower respiratory sp ecimens during the acute    phase of infection. The expected result is Negative. Fact Sheet for Patients:  StrictlyIdeas.no Fact Sheet for Healthcare Providers: BankingDealers.co.za This test is not yet approved or cleared by the [place] FDA and has been authorized for detection and/or diagnosis of SARS-CoV-2 by FDA under an Emergency Use Authorization (EUA).  This EUA will remain in effect (meaning this test can be used) for the duration of the COVID-19 declaration under Section 564(b)(1) of the Act, 21 U.S.C. section 360bbb-3(b)(1), unless the authorization is terminated or revoked sooner. Performed at [facility], [place] [address] [place], [place] [address]   Aerobic/Anaerobic Culture (surgical/deep wound)     Status: None   Collection Time: 02/22/19  2:01 PM   Specimen: Synovial, Right Knee; Body Fluid  Result Value Ref Range Status   Specimen Description SYNOVIAL RIGHT KNEE  Final   Special Requests NONE  Final   Gram Stain   Final    ABUNDANT WBC PRESENT, PREDOMINANTLY PMN FEW GRAM POSITIVE RODS RARE GRAM NEGATIVE RODS RARE GRAM VARIABLE ROD    Culture   Final    MODERATE ESCHERICHIA COLI NO ANAEROBES ISOLATED Performed at [facility], [address] [place], [place] [address]    Report Status 02/26/2019 FINAL  Final   Organism ID, Bacteria ESCHERICHIA COLI  Final      Susceptibility   Escherichia coli - MIC*    AMPICILLIN >=32 RESISTANT Resistant     CEFAZOLIN <=4 SENSITIVE Sensitive     CEFEPIME <=1 SENSITIVE Sensitive     CEFTAZIDIME <=1 SENSITIVE Sensitive     CEFTRIAXONE <=1 SENSITIVE Sensitive     CIPROFLOXACIN <=0.25 SENSITIVE Sensitive     GENTAMICIN <=1 SENSITIVE Sensitive     IMIPENEM <=0.25 SENSITIVE Sensitive     TRIMETH/SULFA >=320 RESISTANT Resistant     AMPICILLIN/SULBACTAM >=32 RESISTANT Resistant     PIP/TAZO <=4 SENSITIVE Sensitive     Extended ESBL NEGATIVE Sensitive     *  MODERATE ESCHERICHIA COLI   Culture, blood (routine x 2)     Status: None   Collection Time: 02/23/19 11:57 PM   Specimen: BLOOD  Result Value Ref Range Status   Specimen Description BLOOD LEFT ANTECUBITAL  Final   Special Requests   Final    BOTTLES DRAWN AEROBIC AND ANAEROBIC Blood Culture results may not be optimal due to an inadequate volume of blood received in culture bottles   Culture   Final    NO GROWTH 5 DAYS Performed at [facility], [address] [place], [place] [address]    Report Status 03/01/2019 FINAL  Final  Culture, blood (routine x 2)     Status: None   Collection Time: 02/24/19 12:03 AM   Specimen: BLOOD LEFT WRIST  Result Value Ref Range Status   Specimen Description BLOOD LEFT WRIST  Final   Special Requests   Final    BOTTLES DRAWN AEROBIC ONLY Blood Culture results may not be optimal due to an inadequate volume of blood received in culture bottles   Culture   Final    NO GROWTH 5 DAYS Performed at [facility], [address], [place], [place] [address]    Report Status 03/01/2019 FINAL  Final      Studies: No results found.  Scheduled Meds:  sodium chloride   Intravenous Once   sodium chloride   Intravenous Once   sodium chloride   Intravenous Once   cholecalciferol  2,000 Units Oral BID   diltiazem  120 mg Oral Daily   ferrous sulfate  325 mg Oral BID   gabapentin  300 mg Oral TID   metoprolol tartrate  25 mg Oral BID   pravastatin  20 mg Oral q1800   senna-docusate  2 tablet Oral BID   sodium chloride flush  3 mL Intravenous Q12H   Vitamin D (Ergocalciferol)  50,000 Units Oral Q7 days   Warfarin - Pharmacist Dosing Inpatient   Does not apply q1800    Continuous Infusions:  cefTRIAXone (ROCEPHIN)  IV 2 g (02/28/19 2140)     LOS: 6 days     Darlin Drop, MD [facility] Pager [phone]  If 7PM-7AM, please contact night-coverage [email or web address] Password [facility] 03/01/2019, 12:17 PM

## 2019-03-01 NOTE — Progress Notes (Signed)
Late Entry:  Nurse paged the attending doctor last night about patient heart rate running between 120 and 140s, attending ordered metoprolol 2.5 mg IV, and it was given as ordered, pain meds giving as well and no other issues noted, patient still sleeping, day shift nurse updated, will continue to monitor.

## 2019-03-01 NOTE — Plan of Care (Signed)
  Problem: Pain Managment: Goal: General experience of comfort will improve Outcome: Progressing   

## 2019-03-01 NOTE — Op Note (Signed)
03/01/2019  5:12 PM  PATIENT:  Robin Snow    PRE-OPERATIVE DIAGNOSIS:  septic total right  knee  POST-OPERATIVE DIAGNOSIS: Septic right total knee arthroplasty with osteomyelitis of the femur with abscess extending up to the hip.  PROCEDURE:  AMPUTATION ABOVE KNEE Application of Prevena wound VAC.  SURGEON:  [name], MD  PHYSICIAN ASSISTANT:None ANESTHESIA:   General  PREOPERATIVE INDICATIONS:  Robin Snow is a  78 y.o. female with a diagnosis of septic total right  knee who failed conservative measures and elected for surgical management.    The risks benefits and alternatives were discussed with the patient preoperatively including but not limited to the risks of infection, bleeding, nerve injury, cardiopulmonary complications, the need for revision surgery, among others, and the patient was willing to proceed.  OPERATIVE IMPLANTS: Praveena wound VAC.  @ENCIMAGES @  OPERATIVE FINDINGS: Patient had necrotic muscle large hematoma and abscess that extended up to the hip.  All muscle was necrotic.  Patient received 2 units packed red blood cells in the operating room and 3 vials of albumin.  OPERATIVE PROCEDURE: Patient was brought to the operating room and underwent a general anesthetic.  After adequate levels anesthesia were obtained patient's right lower extremity was prepped using DuraPrep draped into a sterile field the draining abscess from the knee was draped out of the sterile field underneath the impervious stockinette.  A fishmouth incision was made proximal to the total knee incision.  After incising the skin there was necrotic fat and muscle and a large hematoma of the hematoma was continuously evacuated throughout the case.  This hematoma extended up to the thigh with abscess and necrotic muscle.  Extensive muscle resection was performed.  The vascular bundle was suture ligated with 2-0 silk x2 medially.  The remainder of the amputation was completed the femur  was transected with a reciprocating saw just proximal to the revision total knee implant.  Patient underwent serial irrigations with normal saline there was extensive debridement of muscle.  There is extensive debridement of hematoma extending up to the hip.  All remaining tissue appeared viable.  The incision was closed using 2-0 nylon a Praveena customizable and arthroform dressing was applied this had a good suction fit.  The LMA was removed the patient was intubated to be taken to the ICU.  A central line was placed by anesthesia.  Patient was taken to the ICU in stable condition.   DISCHARGE PLANNING:  Antibiotic duration: Continue IV antibiotics for sepsis secondary to E. coli  Weightbearing: Not applicable  Pain medication: As per ICU team  Dressing care/ Wound VAC: Continue wound VAC for 1 week  Ambulatory devices: Not applicable  Discharge to: Anticipate discharge to skilled nursing.  Follow-up: In the office 1 week post operative.

## 2019-03-02 ENCOUNTER — Inpatient Hospital Stay (HOSPITAL_COMMUNITY): Payer: Medicare HMO

## 2019-03-02 ENCOUNTER — Encounter (HOSPITAL_COMMUNITY): Payer: Self-pay | Admitting: Anesthesiology

## 2019-03-02 DIAGNOSIS — J9690 Respiratory failure, unspecified, unspecified whether with hypoxia or hypercapnia: Secondary | ICD-10-CM

## 2019-03-02 DIAGNOSIS — B9620 Unspecified Escherichia coli [E. coli] as the cause of diseases classified elsewhere: Secondary | ICD-10-CM | POA: Diagnosis not present

## 2019-03-02 DIAGNOSIS — D500 Iron deficiency anemia secondary to blood loss (chronic): Secondary | ICD-10-CM

## 2019-03-02 DIAGNOSIS — M879 Osteonecrosis, unspecified: Secondary | ICD-10-CM

## 2019-03-02 DIAGNOSIS — Z9911 Dependence on respirator [ventilator] status: Secondary | ICD-10-CM

## 2019-03-02 DIAGNOSIS — R7881 Bacteremia: Secondary | ICD-10-CM | POA: Diagnosis not present

## 2019-03-02 DIAGNOSIS — T8451XA Infection and inflammatory reaction due to internal right hip prosthesis, initial encounter: Secondary | ICD-10-CM

## 2019-03-02 DIAGNOSIS — Z9889 Other specified postprocedural states: Secondary | ICD-10-CM

## 2019-03-02 DIAGNOSIS — Z89611 Acquired absence of right leg above knee: Secondary | ICD-10-CM

## 2019-03-02 DIAGNOSIS — Z978 Presence of other specified devices: Secondary | ICD-10-CM

## 2019-03-02 DIAGNOSIS — Z96641 Presence of right artificial hip joint: Secondary | ICD-10-CM

## 2019-03-02 LAB — CBC WITH DIFFERENTIAL/PLATELET
Abs Immature Granulocytes: 0.63 10*3/uL — ABNORMAL HIGH (ref 0.00–0.07)
Basophils Absolute: 0 10*3/uL (ref 0.0–0.1)
Basophils Relative: 0 %
Eosinophils Absolute: 0 10*3/uL (ref 0.0–0.5)
Eosinophils Relative: 0 %
HCT: 22.5 % — ABNORMAL LOW (ref 36.0–46.0)
Hemoglobin: 7.5 g/dL — ABNORMAL LOW (ref 12.0–15.0)
Immature Granulocytes: 3 %
Lymphocytes Relative: 14 %
Lymphs Abs: 2.8 10*3/uL (ref 0.7–4.0)
MCH: 29.6 pg (ref 26.0–34.0)
MCHC: 33.3 g/dL (ref 30.0–36.0)
MCV: 88.9 fL (ref 80.0–100.0)
Monocytes Absolute: 1.5 10*3/uL — ABNORMAL HIGH (ref 0.1–1.0)
Monocytes Relative: 7 %
Neutro Abs: 14.6 10*3/uL — ABNORMAL HIGH (ref 1.7–7.7)
Neutrophils Relative %: 76 %
Platelets: 263 10*3/uL (ref 150–400)
RBC: 2.53 MIL/uL — ABNORMAL LOW (ref 3.87–5.11)
RDW: 15.6 % — ABNORMAL HIGH (ref 11.5–15.5)
WBC: 19.5 10*3/uL — ABNORMAL HIGH (ref 4.0–10.5)
nRBC: 0.5 % — ABNORMAL HIGH (ref 0.0–0.2)

## 2019-03-02 LAB — POCT I-STAT 7, (LYTES, BLD GAS, ICA,H+H)
Acid-base deficit: 3 mmol/L — ABNORMAL HIGH (ref 0.0–2.0)
Bicarbonate: 21.4 mmol/L (ref 20.0–28.0)
Calcium, Ion: 1.07 mmol/L — ABNORMAL LOW (ref 1.15–1.40)
HCT: 21 % — ABNORMAL LOW (ref 36.0–46.0)
Hemoglobin: 7.1 g/dL — ABNORMAL LOW (ref 12.0–15.0)
O2 Saturation: 99 %
Patient temperature: 99.1
Potassium: 4.6 mmol/L (ref 3.5–5.1)
Sodium: 137 mmol/L (ref 135–145)
TCO2: 23 mmol/L (ref 22–32)
pCO2 arterial: 37.2 mmHg (ref 32.0–48.0)
pH, Arterial: 7.37 (ref 7.350–7.450)
pO2, Arterial: 149 mmHg — ABNORMAL HIGH (ref 83.0–108.0)

## 2019-03-02 LAB — GLUCOSE, CAPILLARY
Glucose-Capillary: 120 mg/dL — ABNORMAL HIGH (ref 70–99)
Glucose-Capillary: 138 mg/dL — ABNORMAL HIGH (ref 70–99)
Glucose-Capillary: 147 mg/dL — ABNORMAL HIGH (ref 70–99)

## 2019-03-02 LAB — PROTIME-INR
INR: 1.5 — ABNORMAL HIGH (ref 0.8–1.2)
Prothrombin Time: 18.2 seconds — ABNORMAL HIGH (ref 11.4–15.2)

## 2019-03-02 LAB — PREPARE RBC (CROSSMATCH)

## 2019-03-02 MED ORDER — SODIUM CHLORIDE 0.9 % IV BOLUS
500.0000 mL | Freq: Once | INTRAVENOUS | Status: AC
  Administered 2019-03-02: 500 mL via INTRAVENOUS

## 2019-03-02 MED ORDER — VITAMIN D 25 MCG (1000 UNIT) PO TABS
2000.0000 [IU] | ORAL_TABLET | Freq: Two times a day (BID) | ORAL | Status: DC
  Administered 2019-03-02 – 2019-03-08 (×12): 2000 [IU]
  Filled 2019-03-02 (×12): qty 2

## 2019-03-02 MED ORDER — LACTATED RINGERS IV BOLUS
500.0000 mL | Freq: Once | INTRAVENOUS | Status: AC
  Administered 2019-03-02: 500 mL via INTRAVENOUS

## 2019-03-02 MED ORDER — SODIUM CHLORIDE 0.9% IV SOLUTION
Freq: Once | INTRAVENOUS | Status: AC
  Administered 2019-03-02: 06:00:00 via INTRAVENOUS

## 2019-03-02 MED ORDER — SENNOSIDES-DOCUSATE SODIUM 8.6-50 MG PO TABS
2.0000 | ORAL_TABLET | Freq: Two times a day (BID) | ORAL | Status: DC
  Administered 2019-03-02 – 2019-03-06 (×8): 2
  Filled 2019-03-02 (×7): qty 2

## 2019-03-02 MED ORDER — ALBUMIN HUMAN 25 % IV SOLN
25.0000 g | Freq: Four times a day (QID) | INTRAVENOUS | Status: AC
  Administered 2019-03-02 (×2): 25 g via INTRAVENOUS
  Filled 2019-03-02 (×2): qty 100

## 2019-03-02 MED ORDER — PRAVASTATIN SODIUM 10 MG PO TABS
20.0000 mg | ORAL_TABLET | Freq: Every day | ORAL | Status: DC
  Administered 2019-03-02 – 2019-03-07 (×6): 20 mg
  Filled 2019-03-02 (×7): qty 2

## 2019-03-02 MED ORDER — NOREPINEPHRINE 16 MG/250ML-% IV SOLN
0.0000 ug/min | INTRAVENOUS | Status: DC
  Administered 2019-03-02: 28 ug/min via INTRAVENOUS
  Administered 2019-03-02: 40 ug/min via INTRAVENOUS
  Administered 2019-03-04: 0.5 ug/min via INTRAVENOUS
  Filled 2019-03-02 (×4): qty 250

## 2019-03-02 MED ORDER — VITAMIN D (ERGOCALCIFEROL) 1.25 MG (50000 UNIT) PO CAPS
50000.0000 [IU] | ORAL_CAPSULE | ORAL | Status: DC
  Filled 2019-03-02 (×2): qty 1

## 2019-03-02 MED ORDER — GABAPENTIN 300 MG PO CAPS
300.0000 mg | ORAL_CAPSULE | Freq: Three times a day (TID) | ORAL | Status: DC
  Administered 2019-03-02 – 2019-03-08 (×18): 300 mg
  Filled 2019-03-02 (×18): qty 1

## 2019-03-02 NOTE — Progress Notes (Signed)
Patient ID: Robin Snow, female   DOB: 1941/04/01, 78 y.o.   MRN: [id number] Patient is postop day 1 right above-knee amputation for septic abscess and osteomyelitis right total knee.  Patient is still on Levophed, blood pressure still soft, heart rate elevated in the 130s.  She has now received 4 units of packed red blood cells with a hemoglobin of 7.5.  When patient stabilizes she will require a hip disarticulation.  Dressing is clean and dry no drainage in the wound VAC canister.

## 2019-03-02 NOTE — Progress Notes (Signed)
ANTICOAGULATION CONSULT NOTE - Follow Up Consult  Pharmacy Consult for warfarin Indication: atrial fibrillation  Allergies  Allergen Reactions  . Aspirin Hives and Swelling    Angioedema   . Rofecoxib Hives  . Hydrocodone Hives    Tolerates oxycodone and tramadol    Vital Signs: Temp: 99.9 F (37.7 C) (11/26 0800) Temp Source: Axillary (11/26 0800) BP: 117/71 (11/26 0815) Pulse Rate: 126 (11/26 0815)  Labs: Recent Labs    03/01/19 0354 03/01/19 1151 03/01/19 1638 03/01/19 1700 03/01/19 1750 03/01/19 2030 03/02/19 0422  HGB 7.4* 8.9* 6.8*  --  6.1* 6.8* 7.5*  HCT 22.7* 27.7* 20.0*  --  18.0* 20.5* 22.5*  PLT 329 296  --   --   --   --  263  LABPROT 27.1*  --   --  21.1*  --   --  18.2*  INR 2.5*  --   --  1.8*  --   --  1.5*  CREATININE  --   --  0.90  --   --   --   --     Estimated Creatinine Clearance: 63.9 mL/min (by C-G formula based on SCr of 0.9 mg/dL).  Assessment: 24 YOF on warfarin PTA for atrial fibrillation. On recent admission, pt was discharged 11/04 on warfarin 7.5 mg on Wednesdays and 5 mg on other days. However, pt's daughter reports she was taking 10mg  Wednesdays, Saturdays, and Sundays and 7.5mg  all other days. INR was elevated at 7.3 this admission. Warfarin was held, and Orthopedics ordered IV Vitamin K 5 mg x1 for reversal in ED on 11/18.   INR 1.5 today, had AKA yesterday afternoon.    Goal of Therapy:  INR 2-3 Monitor platelets by anticoagulation protocol: Yes   Plan:  Continue to hold warfarin F/u Ortho plan and ability to start AC/need for bridge   Alanda Slim, PharmD, [facility] Clinical Pharmacist Please see AMION for all Pharmacists' Contact Phone Numbers 03/02/2019, 8:58 AM

## 2019-03-02 NOTE — Progress Notes (Signed)
[facility] Progress Note Patient Name: Robin Snow DOB: [birth year] MRN: [id number]   Date of Service  03/02/2019  HPI/Events of Note  Persistent hypotension despite transfusion of one unit of PRBC's. Hgb 7.5 gm %.  eICU Interventions  Transfuse 1 unit PRBC, LR fluid bolus 500 ml iv bolus x 1        [name] U [name] 03/02/2019, 6:10 AM

## 2019-03-02 NOTE — Anesthesia Preprocedure Evaluation (Deleted)
Anesthesia Evaluation    Reviewed: Allergy & Precautions, Patient's Chart, lab work & pertinent test results  Airway        Dental   Pulmonary sleep apnea , former smoker,           Cardiovascular hypertension, Pt. on home beta blockers and Pt. on medications +CHF       Neuro/Psych CVA negative psych ROS   GI/Hepatic GERD  ,  Endo/Other  diabetes  Renal/GU      Musculoskeletal   Abdominal   Peds  Hematology   Anesthesia Other Findings   Reproductive/Obstetrics                             Anesthesia Physical Anesthesia Plan  ASA: IV  Anesthesia Plan: MAC   Post-op Pain Management:    Induction: Intravenous  PONV Risk Score and Plan: 2 and Treatment may vary due to age or medical condition and Ondansetron  Airway Management Planned: Nasal Cannula and Natural Airway  Additional Equipment:   Intra-op Plan:   Post-operative Plan:   Informed Consent:   Plan Discussed with: CRNA  Anesthesia Plan Comments: (TEE sepsis)        Anesthesia Quick Evaluation

## 2019-03-02 NOTE — Progress Notes (Signed)
[facility]   Reason for visit: Follow up on PJI  Interval History: she is transferred to 4N on vent with post op respiratory failure.  S/p AKA.  Found a lot of necrotic muscle up to hip.  On levophed E coli on blood cultures and wound cultures.    Physical Exam: Constitutional:  Vitals:   03/02/19 1030 03/02/19 1150  BP: (!) 72/45   Pulse: (!) 127   Resp: 17   Temp:    SpO2: 100% 100%   patient is intubated, sedated Eyes: anicteric HENT: + ET Respiratory: respiratory effort on vent; no wheezed Cardiovascular: RRR GI: soft, nt, nd MS: right leg with VAC and s/p AKA  Review of Systems: Unable to be assessed due to patient factors  Lab Results  Component Value Date   WBC 19.5 (H) 03/02/2019   HGB 7.5 (L) 03/02/2019   HCT 22.5 (L) 03/02/2019   MCV 88.9 03/02/2019   PLT 263 03/02/2019    Lab Results  Component Value Date   CREATININE 0.90 03/01/2019   BUN 13 03/01/2019   NA 132 (L) 03/01/2019   K 4.2 03/01/2019   CL 97 (L) 03/01/2019   CO2 27 02/27/2019    Lab Results  Component Value Date   ALT 13 02/04/2019   AST 15 02/04/2019   ALKPHOS 79 02/04/2019     Microbiology: Recent Results (from the past 240 hour(s))  Blood culture (routine x 2)     Status: None   Collection Time: 02/22/19  7:59 AM   Specimen: BLOOD  Result Value Ref Range Status   Specimen Description BLOOD RIGHT ANTECUBITAL  Final   Special Requests   Final    BOTTLES DRAWN AEROBIC ONLY Blood Culture results may not be optimal due to an inadequate volume of blood received in culture bottles   Culture   Final    NO GROWTH 5 DAYS Performed at [facility], [address] [place], [place] [address]    Report Status 02/27/2019 FINAL  Final  Blood culture (routine x 2)     Status: Abnormal   Collection Time: 02/22/19  8:15 AM   Specimen: BLOOD  Result Value Ref Range Status   Specimen Description BLOOD LEFT ANTECUBITAL  Final   Special Requests   Final     BOTTLES DRAWN AEROBIC AND ANAEROBIC Blood Culture results may not be optimal due to an inadequate volume of blood received in culture bottles   Culture  Setup Time   Final    GRAM NEGATIVE RODS AEROBIC BOTTLE ONLY CRITICAL RESULT CALLED TO, READ BACK BY AND VERIFIED WITH: Lieutenant Diego [id number] 02/23/2019 Girtha Hake Performed at [facility], [address], [place], [place] [address]    Culture ESCHERICHIA COLI (A)  Final   Report Status 02/25/2019 FINAL  Final   Organism ID, Bacteria ESCHERICHIA COLI  Final      Susceptibility   Escherichia coli - MIC*    AMPICILLIN >=32 RESISTANT Resistant     CEFAZOLIN <=4 SENSITIVE Sensitive     CEFEPIME <=1 SENSITIVE Sensitive     CEFTAZIDIME <=1 SENSITIVE Sensitive     CEFTRIAXONE <=1 SENSITIVE Sensitive     CIPROFLOXACIN <=0.25 SENSITIVE Sensitive     GENTAMICIN <=1 SENSITIVE Sensitive     IMIPENEM <=0.25 SENSITIVE Sensitive     TRIMETH/SULFA >=320 RESISTANT Resistant     AMPICILLIN/SULBACTAM >=32 RESISTANT Resistant     PIP/TAZO <=4 SENSITIVE Sensitive  Extended ESBL NEGATIVE Sensitive     * ESCHERICHIA COLI  Blood Culture ID Panel (Reflexed)     Status: Abnormal   Collection Time: 02/22/19  8:15 AM  Result Value Ref Range Status   Enterococcus species NOT DETECTED NOT DETECTED Final   Listeria monocytogenes NOT DETECTED NOT DETECTED Final   Staphylococcus species NOT DETECTED NOT DETECTED Final   Staphylococcus aureus (BCID) NOT DETECTED NOT DETECTED Final   Streptococcus species NOT DETECTED NOT DETECTED Final   Streptococcus agalactiae NOT DETECTED NOT DETECTED Final   Streptococcus pneumoniae NOT DETECTED NOT DETECTED Final   Streptococcus pyogenes NOT DETECTED NOT DETECTED Final   Acinetobacter baumannii NOT DETECTED NOT DETECTED Final   Enterobacteriaceae species DETECTED (A) NOT DETECTED Final    Comment: Enterobacteriaceae represent a large family of gram-negative bacteria, not a single organism. CRITICAL RESULT CALLED  TO, READ BACK BY AND VERIFIED WITH: G. ABBOTT,PHARMD [id number] 02/23/2019 T. TYSOR    Enterobacter cloacae complex NOT DETECTED NOT DETECTED Final   Escherichia coli DETECTED (A) NOT DETECTED Final    Comment: CRITICAL RESULT CALLED TO, READ BACK BY AND VERIFIED WITH: G. ABBOTT,PHARMD [id number] 02/23/2019 T. TYSOR    Klebsiella oxytoca NOT DETECTED NOT DETECTED Final   Klebsiella pneumoniae NOT DETECTED NOT DETECTED Final   Proteus species NOT DETECTED NOT DETECTED Final   Serratia marcescens NOT DETECTED NOT DETECTED Final   Carbapenem resistance NOT DETECTED NOT DETECTED Final   Haemophilus influenzae NOT DETECTED NOT DETECTED Final   Neisseria meningitidis NOT DETECTED NOT DETECTED Final   Pseudomonas aeruginosa NOT DETECTED NOT DETECTED Final   Candida albicans NOT DETECTED NOT DETECTED Final   Candida glabrata NOT DETECTED NOT DETECTED Final   Candida krusei NOT DETECTED NOT DETECTED Final   Candida parapsilosis NOT DETECTED NOT DETECTED Final   Candida tropicalis NOT DETECTED NOT DETECTED Final    Comment: Performed at [facility], [place]. [address] [place], [place] [address]  SARS Coronavirus 2 by RT PCR (hospital order, performed in [facility] hospital lab) Nasopharyngeal Nasopharyngeal Swab     Status: None   Collection Time: 02/22/19  9:43 AM   Specimen: Nasopharyngeal Swab  Result Value Ref Range Status   SARS Coronavirus 2 NEGATIVE NEGATIVE Final    Comment: (NOTE) If result is NEGATIVE SARS-CoV-2 target nucleic acids are NOT DETECTED. The SARS-CoV-2 RNA is generally detectable in upper and lower  respiratory specimens during the acute phase of infection. The lowest  concentration of SARS-CoV-2 viral copies this assay can detect is 250  copies / mL. A negative result does not preclude SARS-CoV-2 infection  and should not be used as the sole basis for treatment or other  patient management decisions.  A negative result may occur with  improper specimen collection /  handling, submission of specimen other  than nasopharyngeal swab, presence of viral mutation(s) within the  areas targeted by this assay, and inadequate number of viral copies  (<250 copies / mL). A negative result must be combined with clinical  observations, patient history, and epidemiological information. If result is POSITIVE SARS-CoV-2 target nucleic acids are DETECTED. The SARS-CoV-2 RNA is generally detectable in upper and lower  respiratory specimens dur ing the acute phase of infection.  Positive  results are indicative of active infection with SARS-CoV-2.  Clinical  correlation with patient history and other diagnostic information is  necessary to determine patient infection status.  Positive results do  not rule out bacterial infection or co-infection with other  viruses. If result is PRESUMPTIVE POSTIVE SARS-CoV-2 nucleic acids MAY BE PRESENT.   A presumptive positive result was obtained on the submitted specimen  and confirmed on repeat testing.  While 2019 novel coronavirus  (SARS-CoV-2) nucleic acids may be present in the submitted sample  additional confirmatory testing may be necessary for epidemiological  and / or clinical management purposes  to differentiate between  SARS-CoV-2 and other Sarbecovirus currently known to infect humans.  If clinically indicated additional testing with an alternate test  methodology [phone]([id number]) is advised. The SARS-CoV-2 RNA is generally  detectable in upper and lower respiratory sp ecimens during the acute  phase of infection. The expected result is Negative. Fact Sheet for Patients:  BoilerBrush.com.cyhttps://www.fda.gov/media/136312/download Fact Sheet for Healthcare Providers: https://pope.com/https://www.fda.gov/media/136313/download This test is not yet approved or cleared by the [place] FDA and has been authorized for detection and/or diagnosis of SARS-CoV-2 by FDA under an Emergency Use Authorization (EUA).  This EUA will remain in effect (meaning this  test can be used) for the duration of the COVID-19 declaration under Section 564(b)(1) of the Act, 21 U.S.C. section 360bbb-3(b)(1), unless the authorization is terminated or revoked sooner. Performed at [facility], [address] [place], [place] [address]   Aerobic/Anaerobic Culture (surgical/deep wound)     Status: None   Collection Time: 02/22/19  2:01 PM   Specimen: Synovial, Right Knee; Body Fluid  Result Value Ref Range Status   Specimen Description SYNOVIAL RIGHT KNEE  Final   Special Requests NONE  Final   Gram Stain   Final    ABUNDANT WBC PRESENT, PREDOMINANTLY PMN FEW GRAM POSITIVE RODS RARE GRAM NEGATIVE RODS RARE GRAM VARIABLE ROD    Culture   Final    MODERATE ESCHERICHIA COLI NO ANAEROBES ISOLATED Performed at [facility], [address] [place], [place] [address]    Report Status 02/26/2019 FINAL  Final   Organism ID, Bacteria ESCHERICHIA COLI  Final      Susceptibility   Escherichia coli - MIC*    AMPICILLIN >=32 RESISTANT Resistant     CEFAZOLIN <=4 SENSITIVE Sensitive     CEFEPIME <=1 SENSITIVE Sensitive     CEFTAZIDIME <=1 SENSITIVE Sensitive     CEFTRIAXONE <=1 SENSITIVE Sensitive     CIPROFLOXACIN <=0.25 SENSITIVE Sensitive     GENTAMICIN <=1 SENSITIVE Sensitive     IMIPENEM <=0.25 SENSITIVE Sensitive     TRIMETH/SULFA >=320 RESISTANT Resistant     AMPICILLIN/SULBACTAM >=32 RESISTANT Resistant     PIP/TAZO <=4 SENSITIVE Sensitive     Extended ESBL NEGATIVE Sensitive     * MODERATE ESCHERICHIA COLI  Culture, blood (routine x 2)     Status: None   Collection Time: 02/23/19 11:57 PM   Specimen: BLOOD  Result Value Ref Range Status   Specimen Description BLOOD LEFT ANTECUBITAL  Final   Special Requests   Final    BOTTLES DRAWN AEROBIC AND ANAEROBIC Blood Culture results may not be optimal due to an inadequate volume of blood received in culture bottles   Culture   Final    NO GROWTH 5 DAYS Performed at [facility], [address] [place], [place] [address]    Report Status 03/01/2019 FINAL  Final  Culture, blood (routine x 2)     Status: None   Collection Time: 02/24/19 12:03 AM   Specimen: BLOOD LEFT WRIST  Result Value Ref Range Status   Specimen Description BLOOD LEFT WRIST  Final  Special Requests   Final    BOTTLES DRAWN AEROBIC ONLY Blood Culture results may not be optimal due to an inadequate volume of blood received in culture bottles   Culture   Final    NO GROWTH 5 DAYS Performed at [facility], [address], [place], [place] [address]    Report Status 03/01/2019 FINAL  Final    Impression/Plan:  1. Extensive leg infection with PJI and extension to hip including necrotic muscle.  Now will need hip disarticulation due to extenisive infection and plan to do once she stabilizes, per Dr. Lajoyce Corners.   Continue with ceftriaxone for E coli infection  2.  Respiratory failure - remains vented  3.  Blood loss - Hgb now stable at 7.5 after receiving blood.

## 2019-03-02 NOTE — Progress Notes (Signed)
NAME:  Robin Snow, MRN:  [id number], DOB:  1941-01-13, LOS: 7 ADMISSION DATE:  02/22/2019, CONSULTATION DATE: 11/25 REFERRING MD: Dr. Margo AyeHall, CHIEF COMPLAINT: Postop respiratory failure, s/p BKA for septic knee prosthesis  Brief History   78 yo F with R knee replacement, s/p revision in 2018, presenting with prosthetic dislocation and abscess. S/p closed reduction, drainage 11/18. Treating for e.coli bacteremia. Underwent R AKA 11/25 and was intubated after procedure for respiratory failure.   History of present illness   78 yo F PMH Afib on coumadin, HTN, DM2, CKD II-III, R TKR s/p revision arthroplasty in 2018, recent e.coli bacteremia (admitted 10/19-11/14), Sick sinus syndrome s/p pacemaker, OSA, Pulmonary HTN, who presented to ED 11/18 with CC R knee pain and swelling. Noted to have dislocation of R prosthetic and abscess. Underwent closed reduction and drainage with Ortho on 11/18. Began rocephin for e.coli bacteremia. ID following, Cardiology following for possible TEE. 11/24 patient complains of worsening pain, swelling blistering. Ortho recommends R AKA (source control, decrease pain). Patient/family agree and patient underwent R AKA 11/25 with ortho. Following procedure, patient intubated in PACU due to acute respiratory failure.   PCCM to assume care 11/25 as patient intubated and coming to ICU.   Past Medical History   Past Medical History:  Diagnosis Date  . AKI (acute kidney injury) (HCC) 04/2016  . Anemia   . Arthritis   . Chronic atrial fibrillation (HCC)   . Chronic edema   . Chronic venous insufficiency   . CKD (chronic kidney disease), stage III   . Coagulopathy (HCC)   . Diabetes mellitus    Type 2  . Diverticulosis   . Dyslipidemia   . GERD (gastroesophageal reflux disease)   . GI bleed 2015   a. lower GI from diverticular source 2015.  [name]. Hx of transfusion of packed red blood cells   . Hypertension   . Morbid obesity (HCC)   . Obstructive sleep apnea     on CPAP  . Pulmonary hypertension (HCC)   . Pyelonephritis 04/2016  . Renal infarct (HCC)    a. 04/2016- adm with flank pain, ? pyelo vs renal infarct in setting of recent subtherapeutic INR.  [name]. Sinus bradycardia   . Stroke [facility](HCC) 2015     Significant Hospital Events   11/18 admitted. R knee closed reduction, drainage 11/21 ongoing tx for e coli bacteremia 11/23 PT rec dc to SNF when appropriate, family refuses. ID consulted for abx duration 11/24 ID recommend TEE, cardiology consulted. 11/25 R AKA. Reintubated following surgery. To ICU    Consults:  ID Ortho PCCM Cardiology  Procedures:  11/18> closed reduction of R knee prosthetic, drainage 11/25 R AKA 11/25 ETT>>>   Significant Diagnostic Tests:  11/18 R knee plain film: Large R knee effusion, anterior dislocation of femoral component to tibial component   Micro Data:  11/18 SARS Cov2> neg  11/18 synovial fluid: Moderate E.Coli 11/18 BCx: e.coli 11/20 BCx> no growth to date   Antimicrobials:  Rocephin 11/19>>> (6 wk course)   Interim history/subjective:  On vent Hypotensive this morning Fluid resuscitation, being transfused  Objective   Blood pressure 117/71, pulse (!) 126, temperature 99.9 F (37.7 C), temperature source Axillary, resp. rate 20, height 5\' 7"  (1.702 m), weight 107 kg, SpO2 100 %. CVP:  [3 mmHg-4 mmHg] 3 mmHg  Vent Mode: PRVC FiO2 (%):  [40 %-50 %] 40 % Set Rate:  [15 bmp] 15 bmp Vt Set:  [370 mL-490 mL] 370 mL  PEEP:  [5 cmH20] 5 cmH20 Plateau Pressure:  [10 [id number] cmH20] 14 cmH20   Intake/Output Summary (Last 24 hours) at 03/02/2019 1037 Last data filed at 03/02/2019 0800 Gross per 24 hour  Intake 3981.91 ml  Output 1325 ml  Net 2656.91 ml   Filed Weights   02/24/19 1030 03/01/19 1343  Weight: 107 kg 107 kg    Examination: General: Chronically ill-appearing intubated, sedated HENT: Endotracheal tube in place, moist oral mucosa Lungs: Clear breath sounds bilaterally  Cardiovascular: S1-S2 appreciated Abdomen: Soft, nontender Extremities: Right above-knee amputation with wound VAC in place Neuro: Sedated, not following commands GU: [facility] Problem list     Assessment & Plan:  Postoperative respiratory failure -Continue ventilator support -VAP bundle -SBT as tolerated  Chronic osteomyelitis Post right AKA 11/25 -Wound VAC in place -Management per Ortho  Septic shock -Continue Levophed -MAP goal greater than 65 -Continue IV fluids  E. coli bacteremia -6-week course of Rocephin -Infectious disease following  Chronic kidney disease stage III -Trend electrolytes -Avoid NSAIDs  Atrial fibrillation Sick sinus syndrome -Rate control on diltiazem -Has pacemaker in place  Anemia -Transfuse as needed  Debility -Family had declined SNF  Best practice:  Diet: npo Pain/Anxiety/Delirium protocol (if indicated): Precedex, fentanyl VAP protocol (if indicated): In place DVT prophylaxis: SCD GI prophylaxis: Protonix Glucose control: SSI Mobility: Bedrest Code Status: Full code Family Communication: Pending Disposition: ICU  Labs   CBC: Recent Labs  Lab 02/26/19 0650 02/27/19 0339 02/28/19 0243 03/01/19 0354 03/01/19 1151 03/01/19 1638 03/01/19 1750 03/01/19 2030 03/02/19 0422  WBC 13.6* 12.6* 11.5* 11.8* 14.1*  --   --   --  19.5*  NEUTROABS 9.8* 8.9* 7.6 9.0*  --   --   --   --  14.6*  HGB 7.8* 7.6* 7.5* 7.4* 8.9* 6.8* 6.1* 6.8* 7.5*  HCT 24.4* 23.6* 23.3* 22.7* 27.7* 20.0* 18.0* 20.5* 22.5*  MCV 86.8 86.1 86.9 84.4 86.8  --   --   --  88.9  PLT 306 292 285 329 296  --   --   --  937    Basic Metabolic Panel: Recent Labs  Lab 02/24/19 0003 02/25/19 0514 02/26/19 0650 02/27/19 0339 03/01/19 1638 03/01/19 1750  NA 135 135 134* 134* 136 132*  K 4.3 4.3 4.2 4.1 4.7 4.2  CL 101 100 100 99 97*  --   CO2 24 24 26 27   --   --   GLUCOSE 104* 101* 98 92 128*  --   BUN 15 15 14 12 13   --    CREATININE 1.14* 1.12* 0.96 1.06* 0.90  --   CALCIUM 7.9* 8.1* 8.1* 7.9*  --   --   MG 1.8  --   --   --   --   --   PHOS 2.3*  --   --   --   --   --    GFR: Estimated Creatinine Clearance: 63.9 mL/min (by C-G formula based on SCr of 0.9 mg/dL). Recent Labs  Lab 02/28/19 0243 03/01/19 0354 03/01/19 1151 03/02/19 0422  WBC 11.5* 11.8* 14.1* 19.5*    Liver Function Tests: No results for input(s): AST, ALT, ALKPHOS, BILITOT, PROT, ALBUMIN in the last 168 hours. No results for input(s): LIPASE, AMYLASE in the last 168 hours. No results for input(s): AMMONIA in the last 168 hours.  ABG    Component Value Date/Time   PHART 7.597 (H) 03/01/2019 1750   PCO2ART 29.7 (L) 03/01/2019 1750  PO2ART 166.0 (H) 03/01/2019 1750   HCO3 29.0 (H) 03/01/2019 1750   TCO2 30 03/01/2019 1750   O2SAT 100.0 03/01/2019 1750     Coagulation Profile: Recent Labs  Lab 02/27/19 0339 02/28/19 0243 03/01/19 0354 03/01/19 1700 03/02/19 0422  INR 3.6* 4.2* 2.5* 1.8* 1.5*    Cardiac Enzymes: No results for input(s): CKTOTAL, CKMB, CKMBINDEX, TROPONINI in the last 168 hours.  HbA1C: Hgb A1c MFr Bld  Date/Time Value Ref Range Status  02/03/2019 02:09 AM 5.8 (H) 4.8 - 5.6 % Final    Comment:    REPEATED TO VERIFY (NOTE) Pre diabetes:          5.7%-6.4% Diabetes:              >6.4% Glycemic control for   <7.0% adults with diabetes   04/13/2016 10:18 AM 6.3 (H) 4.8 - 5.6 % Final    Comment:    (NOTE)         Pre-diabetes: 5.7 - 6.4         Diabetes: >6.4         Glycemic control for adults with diabetes: <7.0    The patient is critically ill with multiple organ systems failure and requires high complexity decision making for assessment and support, frequent evaluation and titration of therapies, application of advanced monitoring technologies and extensive interpretation of multiple databases. Critical Care Time devoted to patient care services described in this note independent of  APP/resident time (if applicable)  is 35 minutes.   Virl Diamond MD [facility] Personal pager: [phone] If unanswered, please page CCM On-call: #[phone]

## 2019-03-03 ENCOUNTER — Encounter (HOSPITAL_COMMUNITY)
Admission: EM | Disposition: A | Discharge status: Home / Self Care | Payer: Self-pay | Source: Home / Self Care | Attending: Pulmonary Disease

## 2019-03-03 ENCOUNTER — Encounter (HOSPITAL_COMMUNITY): Payer: Self-pay | Admitting: Orthopedic Surgery

## 2019-03-03 ENCOUNTER — Inpatient Hospital Stay (HOSPITAL_COMMUNITY): Payer: Medicare HMO

## 2019-03-03 DIAGNOSIS — Z978 Presence of other specified devices: Secondary | ICD-10-CM | POA: Diagnosis not present

## 2019-03-03 DIAGNOSIS — B9620 Unspecified Escherichia coli [E. coli] as the cause of diseases classified elsewhere: Secondary | ICD-10-CM | POA: Diagnosis not present

## 2019-03-03 DIAGNOSIS — J9690 Respiratory failure, unspecified, unspecified whether with hypoxia or hypercapnia: Secondary | ICD-10-CM | POA: Diagnosis not present

## 2019-03-03 DIAGNOSIS — D500 Iron deficiency anemia secondary to blood loss (chronic): Secondary | ICD-10-CM | POA: Diagnosis not present

## 2019-03-03 DIAGNOSIS — R7881 Bacteremia: Secondary | ICD-10-CM | POA: Diagnosis not present

## 2019-03-03 DIAGNOSIS — M879 Osteonecrosis, unspecified: Secondary | ICD-10-CM | POA: Diagnosis not present

## 2019-03-03 LAB — BASIC METABOLIC PANEL
Anion gap: 7 (ref 5–15)
BUN: 19 mg/dL (ref 8–23)
CO2: 25 mmol/L (ref 22–32)
Calcium: 7.5 mg/dL — ABNORMAL LOW (ref 8.9–10.3)
Chloride: 108 mmol/L (ref 98–111)
Creatinine, Ser: 1.16 mg/dL — ABNORMAL HIGH (ref 0.44–1.00)
GFR calc Af Amer: 52 mL/min — ABNORMAL LOW (ref >60)
GFR calc non Af Amer: 45 mL/min — ABNORMAL LOW (ref >60)
Glucose, Bld: 136 mg/dL — ABNORMAL HIGH (ref 70–99)
Potassium: 4.2 mmol/L (ref 3.5–5.1)
Sodium: 140 mmol/L (ref 135–145)

## 2019-03-03 LAB — PROTIME-INR
INR: 1.7 — ABNORMAL HIGH (ref 0.8–1.2)
Prothrombin Time: 19.5 seconds — ABNORMAL HIGH (ref 11.4–15.2)

## 2019-03-03 LAB — HEMOGLOBIN AND HEMATOCRIT, BLOOD
HCT: 14.6 % — ABNORMAL LOW (ref 36.0–46.0)
Hemoglobin: 4.7 g/dL — CL (ref 12.0–15.0)

## 2019-03-03 LAB — CBC WITH DIFFERENTIAL/PLATELET
Abs Immature Granulocytes: 0.45 10*3/uL — ABNORMAL HIGH (ref 0.00–0.07)
Basophils Absolute: 0 10*3/uL (ref 0.0–0.1)
Basophils Relative: 0 %
Eosinophils Absolute: 0 10*3/uL (ref 0.0–0.5)
Eosinophils Relative: 0 %
HCT: 15 % — ABNORMAL LOW (ref 36.0–46.0)
Hemoglobin: 4.8 g/dL — CL (ref 12.0–15.0)
Immature Granulocytes: 3 %
Lymphocytes Relative: 11 %
Lymphs Abs: 1.9 10*3/uL (ref 0.7–4.0)
MCH: 28.9 pg (ref 26.0–34.0)
MCHC: 32 g/dL (ref 30.0–36.0)
MCV: 90.4 fL (ref 80.0–100.0)
Monocytes Absolute: 1.8 10*3/uL — ABNORMAL HIGH (ref 0.1–1.0)
Monocytes Relative: 11 %
Neutro Abs: 12.9 10*3/uL — ABNORMAL HIGH (ref 1.7–7.7)
Neutrophils Relative %: 75 %
Platelets: 210 10*3/uL (ref 150–400)
RBC: 1.66 MIL/uL — ABNORMAL LOW (ref 3.87–5.11)
RDW: 16.3 % — ABNORMAL HIGH (ref 11.5–15.5)
WBC: 17.1 10*3/uL — ABNORMAL HIGH (ref 4.0–10.5)
nRBC: 0.2 % (ref 0.0–0.2)

## 2019-03-03 LAB — CBC
HCT: 20.9 % — ABNORMAL LOW (ref 36.0–46.0)
Hemoglobin: 7.1 g/dL — ABNORMAL LOW (ref 12.0–15.0)
MCH: 30.2 pg (ref 26.0–34.0)
MCHC: 34 g/dL (ref 30.0–36.0)
MCV: 88.9 fL (ref 80.0–100.0)
Platelets: 147 10*3/uL — ABNORMAL LOW (ref 150–400)
RBC: 2.35 MIL/uL — ABNORMAL LOW (ref 3.87–5.11)
RDW: 15.1 % (ref 11.5–15.5)
WBC: 15.9 10*3/uL — ABNORMAL HIGH (ref 4.0–10.5)
nRBC: 0.4 % — ABNORMAL HIGH (ref 0.0–0.2)

## 2019-03-03 LAB — PREPARE RBC (CROSSMATCH)

## 2019-03-03 LAB — DIC (DISSEMINATED INTRAVASCULAR COAGULATION)PANEL
D-Dimer, Quant: 1.11 ug/mL-FEU — ABNORMAL HIGH (ref 0.00–0.50)
Fibrinogen: 498 mg/dL — ABNORMAL HIGH (ref 210–475)
INR: 1.8 — ABNORMAL HIGH (ref 0.8–1.2)
Platelets: 192 10*3/uL (ref 150–400)
Prothrombin Time: 20.5 seconds — ABNORMAL HIGH (ref 11.4–15.2)
Smear Review: NONE SEEN
aPTT: 46 seconds — ABNORMAL HIGH (ref 24–36)

## 2019-03-03 SURGERY — ECHOCARDIOGRAM, TRANSESOPHAGEAL
Anesthesia: Monitor Anesthesia Care

## 2019-03-03 MED ORDER — PANTOPRAZOLE SODIUM 40 MG IV SOLR
40.0000 mg | Freq: Two times a day (BID) | INTRAVENOUS | Status: DC
  Administered 2019-03-03 – 2019-03-07 (×9): 40 mg via INTRAVENOUS
  Filled 2019-03-03 (×9): qty 40

## 2019-03-03 MED ORDER — FERROUS SULFATE 300 (60 FE) MG/5ML PO SYRP
300.0000 mg | ORAL_SOLUTION | Freq: Two times a day (BID) | ORAL | Status: DC
  Administered 2019-03-03 – 2019-03-08 (×11): 300 mg
  Filled 2019-03-03 (×15): qty 5

## 2019-03-03 MED ORDER — SODIUM CHLORIDE 0.9% IV SOLUTION: Freq: Once | INTRAVENOUS | Status: DC

## 2019-03-03 NOTE — Progress Notes (Signed)
[facility]   Reason for visit: Follow up on PJI  Interval History: remains intubated on 4N.  No response on exam.  No acute events.  Less pressor support   Physical Exam: Constitutional:  Vitals:   03/03/19 1330 03/03/19 1400  BP: 120/73 99/65  Pulse: 97 97  Resp: 16 16  Temp: (!) 96.7 F (35.9 C)   SpO2: 100% 100%   patient is intubated, sedated HENT: +ET Respiratory: respiratory effort on vent; no wheezes Cardiovascular: RRR GI: soft MS: right leg with wound vac  Review of Systems: Unable to be assessed due to patient factors  Lab Results  Component Value Date   WBC 17.1 (H) 03/03/2019   HGB 4.7 (LL) 03/03/2019   HCT 14.6 (L) 03/03/2019   MCV 90.4 03/03/2019   PLT 192 03/03/2019    Lab Results  Component Value Date   CREATININE 1.16 (H) 03/03/2019   BUN 19 03/03/2019   NA 140 03/03/2019   K 4.2 03/03/2019   CL 108 03/03/2019   CO2 25 03/03/2019    Lab Results  Component Value Date   ALT 13 02/04/2019   AST 15 02/04/2019   ALKPHOS 79 02/04/2019     Microbiology: Recent Results (from the past 240 hour(s))  Blood culture (routine x 2)     Status: None   Collection Time: 02/22/19  7:59 AM   Specimen: BLOOD  Result Value Ref Range Status   Specimen Description BLOOD RIGHT ANTECUBITAL  Final   Special Requests   Final    BOTTLES DRAWN AEROBIC ONLY Blood Culture results may not be optimal due to an inadequate volume of blood received in culture bottles   Culture   Final    NO GROWTH 5 DAYS Performed at [facility], [address], [place], [place] [address]    Report Status 02/27/2019 FINAL  Final  Blood culture (routine x 2)     Status: Abnormal   Collection Time: 02/22/19  8:15 AM   Specimen: BLOOD  Result Value Ref Range Status   Specimen Description BLOOD LEFT ANTECUBITAL  Final   Special Requests   Final    BOTTLES DRAWN AEROBIC AND ANAEROBIC Blood Culture results may not be optimal due to an inadequate  volume of blood received in culture bottles   Culture  Setup Time   Final    GRAM NEGATIVE RODS AEROBIC BOTTLE ONLY CRITICAL RESULT CALLED TO, READ BACK BY AND VERIFIED WITH: Lieutenant Diego [id number] 02/23/2019 Girtha Hake Performed at [facility], [address] [place], [place] [address]    Culture ESCHERICHIA COLI (A)  Final   Report Status 02/25/2019 FINAL  Final   Organism ID, Bacteria ESCHERICHIA COLI  Final      Susceptibility   Escherichia coli - MIC*    AMPICILLIN >=32 RESISTANT Resistant     CEFAZOLIN <=4 SENSITIVE Sensitive     CEFEPIME <=1 SENSITIVE Sensitive     CEFTAZIDIME <=1 SENSITIVE Sensitive     CEFTRIAXONE <=1 SENSITIVE Sensitive     CIPROFLOXACIN <=0.25 SENSITIVE Sensitive     GENTAMICIN <=1 SENSITIVE Sensitive     IMIPENEM <=0.25 SENSITIVE Sensitive     TRIMETH/SULFA >=320 RESISTANT Resistant     AMPICILLIN/SULBACTAM >=32 RESISTANT Resistant     PIP/TAZO <=4 SENSITIVE Sensitive     Extended ESBL NEGATIVE Sensitive     * ESCHERICHIA COLI  Blood Culture ID Panel (Reflexed)     Status: Abnormal  Collection Time: 02/22/19  8:15 AM  Result Value Ref Range Status   Enterococcus species NOT DETECTED NOT DETECTED Final   Listeria monocytogenes NOT DETECTED NOT DETECTED Final   Staphylococcus species NOT DETECTED NOT DETECTED Final   Staphylococcus aureus (BCID) NOT DETECTED NOT DETECTED Final   Streptococcus species NOT DETECTED NOT DETECTED Final   Streptococcus agalactiae NOT DETECTED NOT DETECTED Final   Streptococcus pneumoniae NOT DETECTED NOT DETECTED Final   Streptococcus pyogenes NOT DETECTED NOT DETECTED Final   Acinetobacter baumannii NOT DETECTED NOT DETECTED Final   Enterobacteriaceae species DETECTED (A) NOT DETECTED Final    Comment: Enterobacteriaceae represent a large family of gram-negative bacteria, not a single organism. CRITICAL RESULT CALLED TO, READ BACK BY AND VERIFIED WITH: G. ABBOTT,PHARMD [id number] 02/23/2019 T. TYSOR    Enterobacter  cloacae complex NOT DETECTED NOT DETECTED Final   Escherichia coli DETECTED (A) NOT DETECTED Final    Comment: CRITICAL RESULT CALLED TO, READ BACK BY AND VERIFIED WITH: G. ABBOTT,PHARMD [id number] 02/23/2019 T. TYSOR    Klebsiella oxytoca NOT DETECTED NOT DETECTED Final   Klebsiella pneumoniae NOT DETECTED NOT DETECTED Final   Proteus species NOT DETECTED NOT DETECTED Final   Serratia marcescens NOT DETECTED NOT DETECTED Final   Carbapenem resistance NOT DETECTED NOT DETECTED Final   Haemophilus influenzae NOT DETECTED NOT DETECTED Final   Neisseria meningitidis NOT DETECTED NOT DETECTED Final   Pseudomonas aeruginosa NOT DETECTED NOT DETECTED Final   Candida albicans NOT DETECTED NOT DETECTED Final   Candida glabrata NOT DETECTED NOT DETECTED Final   Candida krusei NOT DETECTED NOT DETECTED Final   Candida parapsilosis NOT DETECTED NOT DETECTED Final   Candida tropicalis NOT DETECTED NOT DETECTED Final    Comment: Performed at [facility], [place] [place], [place] [address]  SARS Coronavirus 2 by RT PCR (hospital order, performed in [facility] hospital lab) Nasopharyngeal Nasopharyngeal Swab     Status: None   Collection Time: 02/22/19  9:43 AM   Specimen: Nasopharyngeal Swab  Result Value Ref Range Status   SARS Coronavirus 2 NEGATIVE NEGATIVE Final    Comment: (NOTE) If result is NEGATIVE SARS-CoV-2 target nucleic acids are NOT DETECTED. The SARS-CoV-2 RNA is generally detectable in upper and lower  respiratory specimens during the acute phase of infection. The lowest  concentration of SARS-CoV-2 viral copies this assay can detect is 250  copies / mL. A negative result does not preclude SARS-CoV-2 infection  and should not be used as the sole basis for treatment or other  patient management decisions.  A negative result may occur with  improper specimen collection / handling, submission of specimen other  than nasopharyngeal swab, presence of viral mutation(s) within  the  areas targeted by this assay, and inadequate number of viral copies  (<250 copies / mL). A negative result must be combined with clinical  observations, patient history, and epidemiological information. If result is POSITIVE SARS-CoV-2 target nucleic acids are DETECTED. The SARS-CoV-2 RNA is generally detectable in upper and lower  respiratory specimens dur ing the acute phase of infection.  Positive  results are indicative of active infection with SARS-CoV-2.  Clinical  correlation with patient history and other diagnostic information is  necessary to determine patient infection status.  Positive results do  not rule out bacterial infection or co-infection with other viruses. If result is PRESUMPTIVE POSTIVE SARS-CoV-2 nucleic acids MAY BE PRESENT.   A presumptive positive result was obtained on the submitted specimen  and confirmed on repeat testing.  While 2019 novel coronavirus  (SARS-CoV-2) nucleic acids may be present in the submitted sample  additional confirmatory testing may be necessary for epidemiological  and / or clinical management purposes  to differentiate between  SARS-CoV-2 and other Sarbecovirus currently known to infect humans.  If clinically indicated additional testing with an alternate test  methodology [id number]) is advised. The SARS-CoV-2 RNA is generally  detectable in upper and lower respiratory sp ecimens during the acute  phase of infection. The expected result is Negative. Fact Sheet for Patients:  BoilerBrush.com.cyhttps://www.fda.gov/media/136312/download Fact Sheet for Healthcare Providers: https://pope.com/https://www.fda.gov/media/136313/download This test is not yet approved or cleared by the [place] FDA and has been authorized for detection and/or diagnosis of SARS-CoV-2 by FDA under an Emergency Use Authorization (EUA).  This EUA will remain in effect (meaning this test can be used) for the duration of the COVID-19 declaration under Section 564(b)(1) of the Act, 21  U.S.C. section 360bbb-3(b)(1), unless the authorization is terminated or revoked sooner. Performed at [facility], [address] [place], [place] [address]   Aerobic/Anaerobic Culture (surgical/deep wound)     Status: None   Collection Time: 02/22/19  2:01 PM   Specimen: Synovial, Right Knee; Body Fluid  Result Value Ref Range Status   Specimen Description SYNOVIAL RIGHT KNEE  Final   Special Requests NONE  Final   Gram Stain   Final    ABUNDANT WBC PRESENT, PREDOMINANTLY PMN FEW GRAM POSITIVE RODS RARE GRAM NEGATIVE RODS RARE GRAM VARIABLE ROD    Culture   Final    MODERATE ESCHERICHIA COLI NO ANAEROBES ISOLATED Performed at [facility], [address] [place], [place] [address]    Report Status 02/26/2019 FINAL  Final   Organism ID, Bacteria ESCHERICHIA COLI  Final      Susceptibility   Escherichia coli - MIC*    AMPICILLIN >=32 RESISTANT Resistant     CEFAZOLIN <=4 SENSITIVE Sensitive     CEFEPIME <=1 SENSITIVE Sensitive     CEFTAZIDIME <=1 SENSITIVE Sensitive     CEFTRIAXONE <=1 SENSITIVE Sensitive     CIPROFLOXACIN <=0.25 SENSITIVE Sensitive     GENTAMICIN <=1 SENSITIVE Sensitive     IMIPENEM <=0.25 SENSITIVE Sensitive     TRIMETH/SULFA >=320 RESISTANT Resistant     AMPICILLIN/SULBACTAM >=32 RESISTANT Resistant     PIP/TAZO <=4 SENSITIVE Sensitive     Extended ESBL NEGATIVE Sensitive     * MODERATE ESCHERICHIA COLI  Culture, blood (routine x 2)     Status: None   Collection Time: 02/23/19 11:57 PM   Specimen: BLOOD  Result Value Ref Range Status   Specimen Description BLOOD LEFT ANTECUBITAL  Final   Special Requests   Final    BOTTLES DRAWN AEROBIC AND ANAEROBIC Blood Culture results may not be optimal due to an inadequate volume of blood received in culture bottles   Culture   Final    NO GROWTH 5 DAYS Performed at [facility], [address] [place], [place] [address]    Report Status 03/01/2019 FINAL  Final  Culture, blood (routine  x 2)     Status: None   Collection Time: 02/24/19 12:03 AM   Specimen: BLOOD LEFT WRIST  Result Value Ref Range Status   Specimen Description BLOOD LEFT WRIST  Final   Special Requests   Final    BOTTLES DRAWN AEROBIC ONLY Blood Culture results may not be optimal due to an inadequate volume of  blood received in culture bottles   Culture   Final    NO GROWTH 5 DAYS Performed at [facility], [address] [place], [place] [address]    Report Status 03/01/2019 FINAL  Final    Impression/Plan:  1. Extensive infection including leg muscle necrosis - on antibiotics and will need further surgery once more stable.    2.  Respiratory failure - she remains vented.    3.  Blood loss - Hgb again dow this am.  Getting blood.    Will continue to follow

## 2019-03-03 NOTE — Progress Notes (Signed)
[place] Progress Note Patient Name: Robin Snow DOB: 1940-11-11 MRN: [id number]   Date of Service  03/03/2019  HPI/Events of Note  Notified of borderline BP MAP still >65 and inquiry if norepinephrine can be resumed if needed. Received blood transfusions earlier today with appropriate rise in H/H. No sign of active bleeding.  eICU Interventions  Ok to resume norepinephrine to keep MAP >65 but request to draw AM labs earlier and assess need for more blood products. Discussed with bedside RN     Intervention Category Major Interventions: Hypotension - evaluation and management  Judd Lien 03/03/2019, 9:27 PM

## 2019-03-03 NOTE — Progress Notes (Signed)
[facility] Progress Note Patient Name: Robin Snow DOB: [birth year] MRN: [id number]   Date of Service  03/03/2019  HPI/Events of Note  Hemoglobin dropped from 7.5 gm to 4.8 gm verified by re-check, no overt source of bleeding.  eICU Interventions  Transfuse 3 units PRBC, Protonix 40 mg iv bid, CT abd/Pelvis w/o contrast to r/o retroperitoneal hematoma, I signed patient out to Dr. Ander Slade for follow up of results and further evaluation.        Kerry Kass Hali Balgobin 03/03/2019, 7:13 AM

## 2019-03-03 NOTE — Progress Notes (Addendum)
PT Cancellation Note / Discharge  Patient Details Name: Robin Snow MRN: [id number] DOB: 02/22/1941   Cancelled Treatment:    Reason Eval/Treat Not Completed: Patient not medically ready(pt with Hgb 4.7 on vent and not medically appropriate) Given pt awaiting further disarticulation and medical instability will sign off at this time and await new order when pt medically appropriate. Discussed with RN who agreed.    Antwian Santaana B Tauriel Scronce 03/03/2019, 7:22 AM  Bayard Males, PT Acute Rehabilitation Services Pager: [phone] Office: [phone]

## 2019-03-03 NOTE — Progress Notes (Signed)
Spoke w/ ortho on call Dr Erlinda Hong. Discussed hgb drop over last 48 hrs. No bleeding from wound vac, negative CT pelvis, no evidence of GIB.  Diagnostic considerations would be CT of RLE but would require IV contrast and her renal function has declined today and it would be difficult to determine old from new hematoma.  -also bleeding more likely to be controlled by tamponading itself w/in the leg and concern that exploration could make things worse. Not to mention she was already pressor dependent so surgical intervention risky at this point. Alternatively we discussed wrapping the RLE in Ace bandage and treating supportively. Given fairly low chance of surgical intervention and high risk associated with such intervention we agreed that option 2 (wrapping extremity and supportive care w/ blood product replacement and correction of coagulopathy) was the better option at this time.  Plan Complete current transfusions F/u CBC Wrap lower extremity.   Erick Colace ACNP-BC [place] Pager # [phone] OR # [phone] if no answer

## 2019-03-03 NOTE — Telephone Encounter (Signed)
They will let us know at the hospital. Will be up to the primary team. -W

## 2019-03-03 NOTE — Progress Notes (Signed)
   I am covering cardmaster role today. This is a patient who was originally scheduled for a TEE today to exclude PPM infection. Consent was obtained 11/25. However, subsequent to that she went to the OR for leg amputation and developed respiratory failure requiring intubation. She remains on a ventilator, on pressors for hypotension, with a Hgb down to 4.8 today. Therefore endoscopy cancelled her procedure. I spoke with Dr. Audie Box who agrees she needs to be more stable medically before we consider procedure. I sent a message to cardmaster inbox for next week to follow along to determine timing of rescheduling. Dayna

## 2019-03-03 NOTE — Progress Notes (Addendum)
NAME:  Robin Snow, MRN:  [id number], DOB:  17-Jul-1940, LOS: 8 ADMISSION DATE:  02/22/2019, CONSULTATION DATE: 11/25 REFERRING MD: Dr. Nevada Crane, CHIEF COMPLAINT: Postop respiratory failure, s/p BKA for septic knee prosthesis  Brief History   78 yo F with R knee replacement, s/p revision in 2018, presenting with prosthetic dislocation and abscess. S/p closed reduction, drainage 11/18. Treating for e.coli bacteremia. Underwent R AKA 11/25 and was intubated after procedure for respiratory failure.   Past Medical History   Past Medical History:  Diagnosis Date  . AKI (acute kidney injury) ([place]) 04/2016  . Anemia   . Arthritis   . Chronic atrial fibrillation ([place])   . Chronic edema   . Chronic venous insufficiency   . CKD (chronic kidney disease), stage III   . Coagulopathy ([place])   . Diabetes mellitus    Type 2  . Diverticulosis   . Dyslipidemia   . GERD (gastroesophageal reflux disease)   . GI bleed 2015   a. lower GI from diverticular source 2015.  [name] Hx of transfusion of packed red blood cells   . Hypertension   . Morbid obesity ()   . Obstructive sleep apnea    on CPAP  . Pulmonary hypertension ([place])   . Pyelonephritis 04/2016  . Renal infarct ([place])    a. 04/2016- adm with flank pain, ? pyelo vs renal infarct in setting of recent subtherapeutic INR.  [name] Sinus bradycardia   . Stroke [facility]) 2015     Significant Hospital Events   11/18 admitted. R knee closed reduction, drainage 11/21 ongoing tx for e coli bacteremia 11/23 PT rec dc to SNF when appropriate, family refuses. ID consulted for abx duration 11/24 ID recommend TEE, cardiology consulted. 11/25 R AKA for septic abscess and osteomyelitis . Reintubated following surgery. To ICU  11/26 hypotensive. Got transfused (total of 4 units); pressor dependent. Surgery noting she would still need hip disarticulation once stable 11/27: hgb down from 7.5 to 4.8 overnight. Still sig output from surgical site drain.3 units  PRBC. 2 units FFP  Consults:  ID Ortho PCCM Cardiology  Procedures:  11/18> closed reduction of R knee prosthetic, drainage 11/25 R AKA 11/25 ETT>>>   Significant Diagnostic Tests:  11/18 R knee plain film: Large R knee effusion, anterior dislocation of femoral component to tibial component  11/27 CT abd/pelvis>>> Micro Data:  11/18 SARS Cov2> neg  11/18 synovial fluid: Moderate E.Coli 11/18 BCx: e.coli 11/20 BCx> neg   Antimicrobials:  Rocephin 11/19>>> (6 wk course)   Interim history/subjective:  Sedated on ventilator.  Still on low dose norepinephrine, first unit of blood being hung.  Just back from CT scan  Objective   Blood pressure 125/66, pulse 98, temperature 97.6 F (36.4 C), temperature source Oral, resp. rate 16, height 5\' 7"  (1.702 m), weight 107 kg, SpO2 100 %. CVP:  [8 mmHg-10 mmHg] 8 mmHg  Vent Mode: PRVC FiO2 (%):  [30 %-40 %] 30 % Set Rate:  [15 bmp] 15 bmp Vt Set:  [370 mL] 370 mL PEEP:  [5 cmH20] 5 cmH20 Plateau Pressure:  [9 cmH20-15 cmH20] 13 cmH20   Intake/Output Summary (Last 24 hours) at 03/03/2019 2992 Last data filed at 03/03/2019 0700 Gross per 24 hour  Intake 2061.2 ml  Output 450 ml  Net 1611.2 ml   Filed Weights   02/24/19 1030 03/01/19 1343  Weight: 107 kg 107 kg    Examination: General: This is a chronically ill malnourished appearing 78 year old female she is  currently sedated on fentanyl and Precedex infusion HEENT: Mild temporal wasting pupils reactive orally intubated right internal jugular catheter unremarkable Pulmonary: Clear to auscultation diminished bases equal air entry bilaterally no accessory use appreciated, currently FiO2 is 30% with saturations 100% Cardiac: Tachycardic regular rhythm mild systolic murmur Abdomen obese soft hypoactive no organomegaly Extremities: Chronic left lower extremity edema/chronic venous stasis changes.  The right lower extremity AKA has wound VAC intact.  There is no clear increase in  bloody drainage from this, difficult to assess swelling given body habitus GU: Clear yellow from external catheter Neuro: [facility] Problem list     Assessment & Plan:  Postoperative respiratory failure PCXR personally reviewed from 11/26: rotated. No obvious infiltrate. Low vol film. Lines and tubes in satisfactory position.  abg reviewed from 11/26 Plan Cont full vent support (hold off on weaning given acute bleeding state) PAD protocol  VAP bundle Am cxr   Status Post right AKA 11/25 for septic abscess and osteomyelitis Plan  On going wound care per ortho to include management of wound vac and dressing changes Will still need to return to OR for hip disarticulation when stable  Circulatory shock. As of 11/27 mix of hemorrhagic/hypovolemic from ABLA and septic shock  Plan F/u CT abd/pelvis looking for [facility] Correct coagulopathy (goal INR <1.5) Transfuse as needed (has 3 units ordered) Titrate norepinephrine for MAP > 65 abx as below   E. coli bacteremia -wbc trending down  Plan Followed by ID. Plan for 6 weeks of rocephin   Chronic kidney disease stage III; at risk for AKI Plan F/u lytes this am  Avoid NSAIDS Renal dose meds Strict I&O   Atrial fibrillation Sick sinus syndrome has PPM Plan Cont tele  Rate control as indicated No ac given acute blood loss  Protein calorie malnutrition Plan Will start trickle feeds later today if no surgical intervention needed  Debility -Family had declined SNF Plan To be determined re: dispo Will need PT/OT  Best practice:  Diet: npo Pain/Anxiety/Delirium protocol (if indicated): Precedex, fentanyl VAP protocol (if indicated): In place DVT prophylaxis: SCD GI prophylaxis: Protonix Glucose control: SSI Mobility: Bedrest Code Status: Full code Family Communication: Pending Disposition: She is critically ill with acute blood loss.  I suspect she has a retroperitoneal hematoma following surgery.  She  currently getting blood, there is some room to correct coagulopathy which we will do.  Awaiting CT abdomen pelvis  My critical care time is 33 minutes  Simonne Martinet ACNP-BC [facility] Pulmonary/Critical Care Pager # [phone] OR # [phone] if no answer

## 2019-03-03 NOTE — Progress Notes (Signed)
Pt transported to CT on vent without complication  

## 2019-03-03 NOTE — Progress Notes (Signed)
OT Cancellation Note  Patient Details Name: Robin Snow MRN: [id number] DOB: Feb 25, 1941   Cancelled Treatment:    Reason Eval/Treat Not Completed: Patient not medically ready(pt with Hgb 4.7 on vent and not medically appropriate)  OT to continue to follow for OT re-evaluation.  Ebony Hail Harold Hedge) Marsa Aris OTR/L Acute Rehabilitation Services Pager: [phone] Office: [place] 03/03/2019, 7:32 AM

## 2019-03-03 NOTE — Progress Notes (Signed)
[place] Progress Note Patient Name: Robin Snow DOB: 1940/11/19 MRN: [id number]   Date of Service  03/03/2019  HPI/Events of Note  Hemoglobin down to 4.8 gm from 7.5 gm on specimen drawn from central line, no clinical evidence of bleeding from anywhere, and no hemodynamic correlates of significant bleeding.  eICU Interventions  Repeat stat H & H to verify low hemoglobin prior to treating with blood products.        Kerry Kass Ogan 03/03/2019, 6:08 AM

## 2019-03-03 NOTE — Progress Notes (Signed)
Initial Nutrition Assessment  DOCUMENTATION CODES:   Obesity unspecified  INTERVENTION:   As able recommend initiate Vital High Protein @ 45 ml/hr (1080 ml/day) with 60 ml Prostat BID  Provides: 1480 kcal, 154 grams protein, and 902 ml free water.    NUTRITION DIAGNOSIS:   Increased nutrient needs related to wound healing as evidenced by estimated needs.  GOAL:   Patient will meet greater than or equal to 90% of their needs  MONITOR:   I & O's  REASON FOR ASSESSMENT:   Ventilator    ASSESSMENT:   Pt with PMH of DM, HTN, morbid obesity, and GER who had R knee replacement s/p revision in 2018 now admitted with prosthetic dislocation and abscess s/p closed reduction and drainage 11/18 treating for e.coli bacteremia now s/p R AKA 11/25 with wound VAC.    Pt intubated post procedure for respiratory failure Pt treated for circulatory shock due to hemorrhagic/hypovolemic from ABLA, CT of abd pending for possible retroperitoneal hemorrhage Pt will still need to return to the OR for hip disarticulation when stable. Per Cardiology pt will have TEE rescheduled.   Patient is currently intubated on ventilator support MV: 6 L/min Temp (24hrs), Avg:98.5 F (36.9 C), Min:97.3 F (36.3 C), Max:99.5 F (37.5 C)  Medications reviewed and include: vitamin D, ferrous sulfate, senokot  Levo @ 8 mcg  Labs reviewed: hemoglobin: 4.7 (L)  16 F NG tube  Moderate edema noted   NUTRITION - FOCUSED PHYSICAL EXAM:  Deferred  Diet Order:   Diet Order            Diet NPO time specified  Diet effective now              EDUCATION NEEDS:   No education needs have been identified at this time  Skin:  Skin Assessment: Skin Integrity Issues: Skin Integrity Issues:: Stage II, Incisions Stage II: buttocks Incisions: R AKA with VAC  Last BM:  11/21  Height:   Ht Readings from Last 1 Encounters:  03/01/19 5\' 7"  (1.702 m)    Weight:   Wt Readings from Last 1 Encounters:   03/01/19 107 kg    Ideal Body Weight:  56.3 kg(adjusted for R AKA)  BMI:  Body mass index is 36.95 kg/m.  Estimated Nutritional Needs:   Kcal:  1300-1500  Protein:  150 grams  Fluid:  >1.5 L/day   Maylon Peppers RD, LDN, CNSC [phone] Pager [phone] After Hours Pager

## 2019-03-03 NOTE — Progress Notes (Signed)
CRITICAL VALUE ALERT  Critical Value:  Hgb 4.7    Date & Time Notied:  03/03/2019 6384  Provider Notified: Dr Lucile Shutters  Orders Received/Actions taken: new orders for blood

## 2019-03-04 ENCOUNTER — Inpatient Hospital Stay (HOSPITAL_COMMUNITY): Payer: Medicare HMO

## 2019-03-04 DIAGNOSIS — J9690 Respiratory failure, unspecified, unspecified whether with hypoxia or hypercapnia: Secondary | ICD-10-CM | POA: Diagnosis not present

## 2019-03-04 DIAGNOSIS — Z950 Presence of cardiac pacemaker: Secondary | ICD-10-CM

## 2019-03-04 DIAGNOSIS — T8451XA Infection and inflammatory reaction due to internal right hip prosthesis, initial encounter: Secondary | ICD-10-CM | POA: Diagnosis not present

## 2019-03-04 DIAGNOSIS — B9620 Unspecified Escherichia coli [E. coli] as the cause of diseases classified elsewhere: Secondary | ICD-10-CM | POA: Diagnosis not present

## 2019-03-04 DIAGNOSIS — R7881 Bacteremia: Secondary | ICD-10-CM | POA: Diagnosis not present

## 2019-03-04 DIAGNOSIS — M879 Osteonecrosis, unspecified: Secondary | ICD-10-CM | POA: Diagnosis not present

## 2019-03-04 LAB — BPAM FFP
Blood Product Expiration Date: 202011292359
Blood Product Expiration Date: 202011292359
ISSUE DATE / TIME: 202011271447
ISSUE DATE / TIME: 202011271644
Unit Type and Rh: 6200
Unit Type and Rh: 6200

## 2019-03-04 LAB — BPAM RBC
Blood Product Expiration Date: 202012152359
Blood Product Expiration Date: 202012152359
Blood Product Expiration Date: 202012172359
Blood Product Expiration Date: 202012172359
Blood Product Expiration Date: 202012172359
Blood Product Expiration Date: 202012202359
Blood Product Expiration Date: 202012202359
Blood Product Expiration Date: 202012282359
Blood Product Expiration Date: 202012302359
Blood Product Expiration Date: 202012312359
ISSUE DATE / TIME: 202011251459
ISSUE DATE / TIME: 202011251632
ISSUE DATE / TIME: 202011252332
ISSUE DATE / TIME: 202011260629
ISSUE DATE / TIME: 202011270822
ISSUE DATE / TIME: 202011271053
Unit Type and Rh: 5100
Unit Type and Rh: 6200
Unit Type and Rh: 6200
Unit Type and Rh: 6200
Unit Type and Rh: 6200
Unit Type and Rh: 6200
Unit Type and Rh: 6200
Unit Type and Rh: 6200
Unit Type and Rh: 6200
Unit Type and Rh: 6200

## 2019-03-04 LAB — TYPE AND SCREEN
ABO/RH(D): A POS
Antibody Screen: POSITIVE
DAT, IgG: NEGATIVE
Donor AG Type: NEGATIVE
Donor AG Type: NEGATIVE
Unit division: 0
Unit division: 0
Unit division: 0
Unit division: 0
Unit division: 0
Unit division: 0
Unit division: 0
Unit division: 0
Unit division: 0
Unit division: 0

## 2019-03-04 LAB — CBC WITH DIFFERENTIAL/PLATELET
Abs Immature Granulocytes: 0.42 10*3/uL — ABNORMAL HIGH (ref 0.00–0.07)
Basophils Absolute: 0 10*3/uL (ref 0.0–0.1)
Basophils Relative: 0 %
Eosinophils Absolute: 0 10*3/uL (ref 0.0–0.5)
Eosinophils Relative: 0 %
HCT: 21.6 % — ABNORMAL LOW (ref 36.0–46.0)
Hemoglobin: 7.2 g/dL — ABNORMAL LOW (ref 12.0–15.0)
Immature Granulocytes: 3 %
Lymphocytes Relative: 10 %
Lymphs Abs: 1.5 10*3/uL (ref 0.7–4.0)
MCH: 29.9 pg (ref 26.0–34.0)
MCHC: 33.3 g/dL (ref 30.0–36.0)
MCV: 89.6 fL (ref 80.0–100.0)
Monocytes Absolute: 1.1 10*3/uL — ABNORMAL HIGH (ref 0.1–1.0)
Monocytes Relative: 8 %
Neutro Abs: 11.5 10*3/uL — ABNORMAL HIGH (ref 1.7–7.7)
Neutrophils Relative %: 79 %
Platelets: 172 10*3/uL (ref 150–400)
RBC: 2.41 MIL/uL — ABNORMAL LOW (ref 3.87–5.11)
RDW: 15.9 % — ABNORMAL HIGH (ref 11.5–15.5)
WBC: 14.6 10*3/uL — ABNORMAL HIGH (ref 4.0–10.5)
nRBC: 0.7 % — ABNORMAL HIGH (ref 0.0–0.2)

## 2019-03-04 LAB — GLUCOSE, CAPILLARY
Glucose-Capillary: 92 mg/dL (ref 70–99)
Glucose-Capillary: 94 mg/dL (ref 70–99)
Glucose-Capillary: 96 mg/dL (ref 70–99)
Glucose-Capillary: 92 mg/dL (ref 70–99)

## 2019-03-04 LAB — PREPARE FRESH FROZEN PLASMA
Unit division: 0
Unit division: 0

## 2019-03-04 LAB — PROTIME-INR
INR: 1.7 — ABNORMAL HIGH (ref 0.8–1.2)
Prothrombin Time: 19.9 seconds — ABNORMAL HIGH (ref 11.4–15.2)

## 2019-03-04 MED ORDER — DEXMEDETOMIDINE HCL IN NACL 400 MCG/100ML IV SOLN
0.0000 ug/kg/h | INTRAVENOUS | Status: AC
  Administered 2019-03-04: 0.3 ug/kg/h via INTRAVENOUS
  Administered 2019-03-05 – 2019-03-06 (×4): 0.4 ug/kg/h via INTRAVENOUS
  Administered 2019-03-07 (×2): 0.5 ug/kg/h via INTRAVENOUS
  Filled 2019-03-04 (×7): qty 100

## 2019-03-04 MED ORDER — VITAL AF 1.2 CAL PO LIQD: 60.0000 mL | ORAL | Status: DC

## 2019-03-04 MED ORDER — VITAL HIGH PROTEIN PO LIQD
1000.0000 mL | ORAL | Status: DC
  Administered 2019-03-04 (×3): 1000 mL

## 2019-03-04 MED ORDER — VITAL HIGH PROTEIN PO LIQD
60.0000 mL | ORAL | Status: DC
  Administered 2019-03-04 – 2019-03-05 (×5): 60 mL
  Filled 2019-03-04 (×8): qty 1000

## 2019-03-04 NOTE — Progress Notes (Signed)
[facility]   Reason for visit: Follow up on PJI  Interval History: remains intubated, remains on pressor support.  WBC trending down slowly, Tmax 100.2.   Day 11 ceftriaxone  Physical Exam: Constitutional:  Vitals:   03/04/19 1030 03/04/19 1109  BP: (!) 100/54 (!) 110/59  Pulse: (!) 48 83  Resp: 19 17  Temp:    SpO2: 100% 100%   patient is intubated, sedated HENT: +ET Respiratory: respiratory effort on vent; no wheezes Cardiovascular: RRR GI: soft MS: right leg with wound vac  Review of Systems: Unable to be assessed due to patient factors  Lab Results  Component Value Date   WBC 14.6 (H) 03/04/2019   HGB 7.2 (L) 03/04/2019   HCT 21.6 (L) 03/04/2019   MCV 89.6 03/04/2019   PLT 172 03/04/2019    Lab Results  Component Value Date   CREATININE 1.16 (H) 03/03/2019   BUN 19 03/03/2019   NA 140 03/03/2019   K 4.2 03/03/2019   CL 108 03/03/2019   CO2 25 03/03/2019    Lab Results  Component Value Date   ALT 13 02/04/2019   AST 15 02/04/2019   ALKPHOS 79 02/04/2019     Microbiology: Recent Results (from the past 240 hour(s))  Aerobic/Anaerobic Culture (surgical/deep wound)     Status: None   Collection Time: 02/22/19  2:01 PM   Specimen: Synovial, Right Knee; Body Fluid  Result Value Ref Range Status   Specimen Description SYNOVIAL RIGHT KNEE  Final   Special Requests NONE  Final   Gram Stain   Final    ABUNDANT WBC PRESENT, PREDOMINANTLY PMN FEW GRAM POSITIVE RODS RARE GRAM NEGATIVE RODS RARE GRAM VARIABLE ROD    Culture   Final    MODERATE ESCHERICHIA COLI NO ANAEROBES ISOLATED Performed at [facility], [address] [place], [place] [address]    Report Status 02/26/2019 FINAL  Final   Organism ID, Bacteria ESCHERICHIA COLI  Final      Susceptibility   Escherichia coli - MIC*    AMPICILLIN >=32 RESISTANT Resistant     CEFAZOLIN <=4 SENSITIVE Sensitive     CEFEPIME <=1 SENSITIVE Sensitive     CEFTAZIDIME <=1  SENSITIVE Sensitive     CEFTRIAXONE <=1 SENSITIVE Sensitive     CIPROFLOXACIN <=0.25 SENSITIVE Sensitive     GENTAMICIN <=1 SENSITIVE Sensitive     IMIPENEM <=0.25 SENSITIVE Sensitive     TRIMETH/SULFA >=320 RESISTANT Resistant     AMPICILLIN/SULBACTAM >=32 RESISTANT Resistant     PIP/TAZO <=4 SENSITIVE Sensitive     Extended ESBL NEGATIVE Sensitive     * MODERATE ESCHERICHIA COLI  Culture, blood (routine x 2)     Status: None   Collection Time: 02/23/19 11:57 PM   Specimen: BLOOD  Result Value Ref Range Status   Specimen Description BLOOD LEFT ANTECUBITAL  Final   Special Requests   Final    BOTTLES DRAWN AEROBIC AND ANAEROBIC Blood Culture results may not be optimal due to an inadequate volume of blood received in culture bottles   Culture   Final    NO GROWTH 5 DAYS Performed at [facility], [address], [place], [place] [address]    Report Status 03/01/2019 FINAL  Final  Culture, blood (routine x 2)     Status: None   Collection Time: 02/24/19 12:03 AM   Specimen: BLOOD LEFT WRIST  Result Value Ref Range Status   Specimen Description  BLOOD LEFT WRIST  Final   Special Requests   Final    BOTTLES DRAWN AEROBIC ONLY Blood Culture results may not be optimal due to an inadequate volume of blood received in culture bottles   Culture   Final    NO GROWTH 5 DAYS Performed at [facility], [place], [place], [place] [address]    Report Status 03/01/2019 FINAL  Final    Impression/Plan:  1. PJI with extension to hip - s/p AKA, necrosis of muscle up to hip.  Will need further surgery but not a good candidate for surgery at this time. Will continue with antibiotics.  2.  Respiratory failure - she remains vented. Also remains on pressor support.   3.  Blood loss - stable Hgb this am.  Concern for leg and now with tamponade with an Ace wrap.    4.  Bacteremia with PM - had been scheduled for TEE to assure no issues with PM.  Postponed for now.    I will be  available as needed, Dr. Baxter Flattery back on Monday

## 2019-03-04 NOTE — Progress Notes (Signed)
NAME:  Robin Snow, MRN:  [id number], DOB:  08/31/1940, LOS: 9 ADMISSION DATE:  02/22/2019, CONSULTATION DATE: 11/25 REFERRING MD: Dr. [place] Crane, CHIEF COMPLAINT: Postop respiratory failure, s/p BKA for septic knee prosthesis  Brief History   78 yo F with R knee replacement, s/p revision in 2018, presenting with prosthetic dislocation and abscess. S/p closed reduction, drainage 11/18. Treating for e.coli bacteremia. Underwent R AKA 11/25 and was intubated after procedure for respiratory failure.   Past Medical History   Past Medical History:  Diagnosis Date   AKI (acute kidney injury) ([place]) 04/2016   Anemia    Arthritis    Chronic atrial fibrillation (HCC)    Chronic edema    Chronic venous insufficiency    CKD (chronic kidney disease), stage III    Coagulopathy (HCC)    Diabetes mellitus    Type 2   Diverticulosis    Dyslipidemia    GERD (gastroesophageal reflux disease)    GI bleed 2015   a. lower GI from diverticular source 2015.   Hx of transfusion of packed red blood cells    Hypertension    Morbid obesity ([place])    Obstructive sleep apnea    on CPAP   Pulmonary hypertension ([place])    Pyelonephritis 04/2016   Renal infarct ([place])    a. 04/2016- adm with flank pain, ? pyelo vs renal infarct in setting of recent subtherapeutic INR.   Sinus bradycardia    Stroke [facility]) 2015     Significant Hospital Events   11/18 admitted. R knee closed reduction, drainage 11/21 ongoing tx for e coli bacteremia 11/23 PT rec dc to SNF when appropriate, family refuses. ID consulted for abx duration 11/24 ID recommend TEE, cardiology consulted. 11/25 R AKA for septic abscess and osteomyelitis . Reintubated following surgery. To ICU  11/26 hypotensive. Got transfused (total of 4 units); pressor dependent. Surgery noting she would still need hip disarticulation once stable 11/27: hgb down from 7.5 to 4.8 overnight. Still sig output from surgical site drain.3 units  PRBC. 2 units FFP  Consults:  ID Ortho PCCM Cardiology  Procedures:  11/18> closed reduction of R knee prosthetic, drainage 11/25 R AKA 11/25 ETT>>>   Significant Diagnostic Tests:  11/18 R knee plain film: Large R knee effusion, anterior dislocation of femoral component to tibial component  11/27 CT abd/pelvis-IMPRESSION: 1. No evidence of retroperitoneal hemorrhage. 2. Mild colonic ileus.  No evidence of bowel obstruction. 3. Moderate diffuse body wall edema. 4. Moderate left renal parenchymal atrophy.  Micro Data:  11/18 SARS Cov2> neg  11/18 synovial fluid: Moderate E.Coli 11/18 BCx: e.coli 11/20 BCx> neg   Antimicrobials:  Rocephin 11/19>>> (6 wk course)   Interim history/subjective:  Sedated on ventilator.   Still requiring low-dose norepinephrine Hematocrit stabilized posttransfusion   Objective   Blood pressure 105/61, pulse (!) 57, temperature 100.2 F (37.9 C), temperature source Oral, resp. rate 16, height 5\' 7"  (1.702 m), weight 107 kg, SpO2 100 %.    Vent Mode: PRVC FiO2 (%):  [30 %] 30 % Set Rate:  [15 bmp] 15 bmp Vt Set:  [370 mL] 370 mL PEEP:  [5 cmH20] 5 cmH20 Plateau Pressure:  [13 cmH20-17 cmH20] 15 cmH20   Intake/Output Summary (Last 24 hours) at 03/04/2019 0915 Last data filed at 03/04/2019 0800 Gross per 24 hour  Intake 2169.4 ml  Output 800 ml  Net 1369.4 ml   Filed Weights   02/24/19 1030 03/01/19 1343  Weight: 107 kg 107 kg  Examination: General: This is a chronically ill malnourished appearing 78 year old female, currently sedated on fentanyl and Precedex HEENT: Temporal wasting Pulmonary: Clear breath sounds anteriorly Cardiac: Tachycardic, systolic murmur Abdomen: Bowel sounds appreciated Extremities: Chronic left lower extremity edema/chronic venous stasis changes.  The right lower extremity AKA has wound VAC intact.  There is no clear increase in bloody drainage from this, difficult to assess swelling given body  habitus GU: Cloudy urine Neuro: [facility] Problem list     Assessment & Plan:   Postoperative respiratory failure -Continue full vent support -VAP bundle in place -Follow radiological changes  Severe anemia -Source of bleeding is unclear -Hematocrit stabilized with transfusion -Coagulopathy corrected -Transfuse as needed -CT abdomen did not reveal hematoma   Circulatory shock -Titrate norepinephrine for MAP greater than 65 -Continue antibiotics  E. coli bacteremia -WBC trending down -Followed by ID -6 weeks of antibiotics  Chronic kidney disease stage III -Trend electrolytes -Renal dose medication -Strict I's and O's  Atrial fibrillation Sick sinus syndrome, pacemaker -Rate control -Continue telemetry monitoring  Protein calorie malnutrition -Trickle feeds  Debility -Family declined skilled nursing facility -Disposition pending    Best practice:  Diet: Start trickle feeding Pain/Anxiety/Delirium protocol (if indicated): Precedex, fentanyl VAP protocol (if indicated): In place DVT prophylaxis: SCD in place GI prophylaxis: Protonix Glucose control: SSI Mobility: Bedrest Code Status: Full code Family Communication: Pending Disposition: Critically ill, stabilized posttransfusion, coagulopathy corrected Continue lines of care Risk of decompensation remains very high  The patient is critically ill with multiple organ systems failure and requires high complexity decision making for assessment and support, frequent evaluation and titration of therapies, application of advanced monitoring technologies and extensive interpretation of multiple databases. Critical Care Time devoted to patient care services described in this note independent of APP/resident time (if applicable)  is 35 minutes.   Virl Diamond MD [facility] Personal pager: [phone] If unanswered, please page CCM On-call: [phone]

## 2019-03-04 NOTE — Progress Notes (Signed)
[place] [facility] Progress Note Patient Name: Robin Snow DOB: February 16, 1941 MRN: [id number]   Date of Service  03/04/2019  HPI/Events of Note  Asking for precedex renewal. Notes , meds reviewed.   eICU Interventions  Precedex re ordered.      Intervention Category Intermediate Interventions: Other:  Elmer Sow 03/04/2019, 8:28 PM

## 2019-03-04 NOTE — Progress Notes (Signed)
OT Cancellation/Discharge Note  Patient Details Name: Robin Snow MRN: [id number] DOB: 02-Jun-1940   Cancelled Treatment:    Reason Eval/Treat Not Completed: Patient not medically ready.  Pt continues to be intubated and requiring pressors.  Once stable will undergo hip disarticulation, per notes.  At this time, pt not medically ready for OT.  Will sign off, and please re order once medically stable.  Lucille Passy, OTR/L [place] Pager [phone] Office [phone]   Lucille Passy M 03/04/2019, 6:11 AM

## 2019-03-04 NOTE — Progress Notes (Signed)
Nutrition Follow-up  RD working remotely.  DOCUMENTATION CODES:   Obesity unspecified  INTERVENTION:   Initiate trickle feedings of Vital High Protein at 20 ml/hr via NGT (480 ml/day)  Tube feeding regimen provides 480 kcal (37% of needs), 42 grams of protein, and 401 ml of H2O.   Once medically able, increase Vital High Protein to 45 ml/hr (1080 ml/day) via NGT  Add 60 ml Prostat BID.    Tube feeding regimen provides 1480 kcal (100% of needs), 154 grams of protein, and 902 ml of H2O.   NUTRITION DIAGNOSIS:   Increased nutrient needs related to wound healing as evidenced by estimated needs.  Ongoing  GOAL:   Provide needs based on ASPEN/SCCM guidelines  Progressing   MONITOR:   Vent status, Weight trends, Labs, TF tolerance, Skin, I & O's  REASON FOR ASSESSMENT:   Ventilator    ASSESSMENT:   Pt with PMH of DM, HTN, morbid obesity, and GER who had R knee replacement s/p revision in 2018 now admitted with prosthetic dislocation and abscess s/p closed reduction and drainage 11/18 treating for e.coli bacteremia now s/p R AKA 11/25 with wound VAC.  Patient is currently intubated on ventilator support MV: 5.6 L/min Temp (24hrs), Avg:97.3 F (36.3 C), Min:96 F (35.6 C), Max:100.2 F (37.9 C)  MAP: 73  Reviewed I/O's: +1.6 L x 24 hours and +5.9 L since admission  UOP: 800 ml x 24 hours  Drain output: 0 ml x 24 hours  Per chart review, plan for pt to undergo hip disarticulation once more medically stable.   Per MD, plan to start trickle feeds today.   Medications reviewed and include precedex and levophed.   Labs reviewed.   Diet Order:   Diet Order            Diet NPO time specified  Diet effective now              EDUCATION NEEDS:   No education needs have been identified at this time  Skin:  Skin Assessment: Skin Integrity Issues: Skin Integrity Issues:: Stage II, Incisions Stage II: buttocks Incisions: R AKA with VAC  Last BM:   02/25/19  Height:   Ht Readings from Last 1 Encounters:  03/01/19 5\' 7"  (1.702 m)    Weight:   Wt Readings from Last 1 Encounters:  03/01/19 107 kg    Ideal Body Weight:  56.3 kg(adjusted for R AKA)  BMI:  Body mass index is 36.95 kg/m.  Estimated Nutritional Needs:   Kcal:  1300-1500  Protein:  150 grams  Fluid:  >1.5 L/day    Kambree Krauss A. Jimmye Norman, RD, LDN, [place] Registered Dietitian II Certified Diabetes Care and Education Specialist Pager: [phone] After hours Pager: [phone]

## 2019-03-05 DIAGNOSIS — B9620 Unspecified Escherichia coli [E. coli] as the cause of diseases classified elsewhere: Secondary | ICD-10-CM | POA: Diagnosis not present

## 2019-03-05 DIAGNOSIS — R7881 Bacteremia: Secondary | ICD-10-CM | POA: Diagnosis not present

## 2019-03-05 LAB — GLUCOSE, CAPILLARY
Glucose-Capillary: 110 mg/dL — ABNORMAL HIGH (ref 70–99)
Glucose-Capillary: 120 mg/dL — ABNORMAL HIGH (ref 70–99)
Glucose-Capillary: 122 mg/dL — ABNORMAL HIGH (ref 70–99)
Glucose-Capillary: 127 mg/dL — ABNORMAL HIGH (ref 70–99)
Glucose-Capillary: 129 mg/dL — ABNORMAL HIGH (ref 70–99)
Glucose-Capillary: 97 mg/dL (ref 70–99)

## 2019-03-05 LAB — CBC WITH DIFFERENTIAL/PLATELET
Abs Immature Granulocytes: 0.16 10*3/uL — ABNORMAL HIGH (ref 0.00–0.07)
Basophils Absolute: 0 10*3/uL (ref 0.0–0.1)
Basophils Relative: 0 %
Eosinophils Absolute: 0 10*3/uL (ref 0.0–0.5)
Eosinophils Relative: 0 %
HCT: 22.9 % — ABNORMAL LOW (ref 36.0–46.0)
Hemoglobin: 7.2 g/dL — ABNORMAL LOW (ref 12.0–15.0)
Immature Granulocytes: 2 %
Lymphocytes Relative: 15 %
Lymphs Abs: 1.4 10*3/uL (ref 0.7–4.0)
MCH: 29.9 pg (ref 26.0–34.0)
MCHC: 31.4 g/dL (ref 30.0–36.0)
MCV: 95 fL (ref 80.0–100.0)
Monocytes Absolute: 0.8 10*3/uL (ref 0.1–1.0)
Monocytes Relative: 8 %
Neutro Abs: 7.1 10*3/uL (ref 1.7–7.7)
Neutrophils Relative %: 75 %
Platelets: 153 10*3/uL (ref 150–400)
RBC: 2.41 MIL/uL — ABNORMAL LOW (ref 3.87–5.11)
RDW: 16.5 % — ABNORMAL HIGH (ref 11.5–15.5)
WBC: 9.5 10*3/uL (ref 4.0–10.5)
nRBC: 0.6 % — ABNORMAL HIGH (ref 0.0–0.2)

## 2019-03-05 LAB — PROTIME-INR
INR: 1.8 — ABNORMAL HIGH (ref 0.8–1.2)
Prothrombin Time: 20.6 seconds — ABNORMAL HIGH (ref 11.4–15.2)

## 2019-03-05 LAB — PHOSPHORUS
Phosphorus: 1.7 mg/dL — ABNORMAL LOW (ref 2.5–4.6)
Phosphorus: 1.4 mg/dL — ABNORMAL LOW (ref 2.5–4.6)

## 2019-03-05 LAB — BASIC METABOLIC PANEL
Anion gap: 10 (ref 5–15)
BUN: 18 mg/dL (ref 8–23)
CO2: 25 mmol/L (ref 22–32)
Calcium: 7.9 mg/dL — ABNORMAL LOW (ref 8.9–10.3)
Chloride: 107 mmol/L (ref 98–111)
Creatinine, Ser: 0.92 mg/dL (ref 0.44–1.00)
GFR calc Af Amer: >60 mL/min (ref >60)
GFR calc non Af Amer: 59 mL/min — ABNORMAL LOW (ref >60)
Glucose, Bld: 126 mg/dL — ABNORMAL HIGH (ref 70–99)
Potassium: 4.1 mmol/L (ref 3.5–5.1)
Sodium: 142 mmol/L (ref 135–145)

## 2019-03-05 LAB — MAGNESIUM
Magnesium: 2 mg/dL (ref 1.7–2.4)
Magnesium: 2 mg/dL (ref 1.7–2.4)

## 2019-03-05 MED ORDER — ACETAMINOPHEN 325 MG PO TABS
650.0000 mg | ORAL_TABLET | Freq: Four times a day (QID) | ORAL | Status: DC | PRN
  Administered 2019-03-05: 650 mg
  Filled 2019-03-05: qty 2

## 2019-03-05 MED ORDER — INSULIN ASPART 100 UNIT/ML ~~LOC~~ SOLN
0.0000 [IU] | SUBCUTANEOUS | Status: DC
  Administered 2019-03-05 (×2): 2 [IU] via SUBCUTANEOUS
  Administered 2019-03-06 (×2): 3 [IU] via SUBCUTANEOUS
  Administered 2019-03-06 (×2): 2 [IU] via SUBCUTANEOUS
  Administered 2019-03-07: 3 [IU] via SUBCUTANEOUS
  Administered 2019-03-07: 2 [IU] via SUBCUTANEOUS
  Administered 2019-03-07: 3 [IU] via SUBCUTANEOUS
  Administered 2019-03-07 – 2019-03-12 (×6): 2 [IU] via SUBCUTANEOUS

## 2019-03-05 MED ORDER — VITAL HIGH PROTEIN PO LIQD
1000.0000 mL | ORAL | Status: DC
  Administered 2019-03-05: 1000 mL

## 2019-03-05 MED ORDER — ACETAMINOPHEN 650 MG RE SUPP
650.0000 mg | Freq: Four times a day (QID) | RECTAL | Status: DC | PRN
  Filled 2019-03-05: qty 1

## 2019-03-05 MED ORDER — PRO-STAT SUGAR FREE PO LIQD
30.0000 mL | Freq: Two times a day (BID) | ORAL | Status: DC
  Administered 2019-03-05 – 2019-03-06 (×3): 30 mL
  Filled 2019-03-05 (×3): qty 30

## 2019-03-05 NOTE — Progress Notes (Signed)
[place] Progress Note Patient Name: Robin Snow DOB: 1940-10-19 MRN: [id number]   Date of Service  03/05/2019  HPI/Events of Note  Agitation - Request to renew bilateral soft wrist restraints.   eICU Interventions  Will renew bilateral soft wrist restraints X 11 hours.      Intervention Category Major Interventions: Delirium, psychosis, severe agitation - evaluation and management  [name] 03/05/2019, 9:28 PM

## 2019-03-05 NOTE — Progress Notes (Addendum)
[place] Progress Note Patient Name: Robin Snow DOB: Feb 26, 1941 MRN: [id number]   Date of Service  03/05/2019  HPI/Events of Note  AM lab  eICU Interventions  BMP ordered, has CBC. Hg stable so far.      Intervention Category Minor Interventions: Other:  Elmer Sow 03/05/2019, 3:40 AM

## 2019-03-05 NOTE — Progress Notes (Signed)
NAME:  Robin Snow, MRN:  [id number], DOB:  02/28/1941, LOS: 10 ADMISSION DATE:  02/22/2019, CONSULTATION DATE: 11/25 REFERRING MD: Dr. Margo Aye, CHIEF COMPLAINT: Postop respiratory failure, s/p BKA for septic knee prosthesis  Brief History   78 yo F with R knee replacement, s/p revision in 2018, presenting with prosthetic dislocation and abscess. S/p closed reduction, drainage 11/18. Treating for e.coli bacteremia. Underwent R AKA 11/25 and was intubated after procedure for respiratory failure.   Past Medical History   Past Medical History:  Diagnosis Date  . AKI (acute kidney injury) (HCC) 04/2016  . Anemia   . Arthritis   . Chronic atrial fibrillation (HCC)   . Chronic edema   . Chronic venous insufficiency   . CKD (chronic kidney disease), stage III   . Coagulopathy (HCC)   . Diabetes mellitus    Type 2  . Diverticulosis   . Dyslipidemia   . GERD (gastroesophageal reflux disease)   . GI bleed 2015   a. lower GI from diverticular source 2015.  [name] Hx of transfusion of packed red blood cells   . Hypertension   . Morbid obesity (HCC)   . Obstructive sleep apnea    on CPAP  . Pulmonary hypertension (HCC)   . Pyelonephritis 04/2016  . Renal infarct (HCC)    a. 04/2016- adm with flank pain, ? pyelo vs renal infarct in setting of recent subtherapeutic INR.  [name] Sinus bradycardia   . Stroke [facility]) 2015     Significant Hospital Events   11/18 admitted. R knee closed reduction, drainage 11/21 ongoing tx for e coli bacteremia 11/23 PT rec dc to SNF when appropriate, family refuses. ID consulted for abx duration 11/24 ID recommend TEE, cardiology consulted. 11/25 R AKA for septic abscess and osteomyelitis . Reintubated following surgery. To ICU  11/26 hypotensive. Got transfused (total of 4 units); pressor dependent. Surgery noting she would still need hip disarticulation once stable 11/27: hgb down from 7.5 to 4.8 overnight. Still sig output from surgical site drain.3 units  PRBC. 2 units FFP 11/29: Hemoglobin has been stable Consults:  ID Ortho PCCM Cardiology  Procedures:  11/18> closed reduction of R knee prosthetic, drainage 11/25 R AKA 11/25 ETT>>>   Significant Diagnostic Tests:  11/18 R knee plain film: Large R knee effusion, anterior dislocation of femoral component to tibial component  11/27 CT abd/pelvis-IMPRESSION: 1. No evidence of retroperitoneal hemorrhage. 2. Mild colonic ileus.  No evidence of bowel obstruction. 3. Moderate diffuse body wall edema. 4. Moderate left renal parenchymal atrophy.  Micro Data:  11/18 SARS Cov2> neg  11/18 synovial fluid: Moderate E.Coli 11/18 BCx: e.coli 11/20 BCx> neg   Antimicrobials:  Rocephin 11/19>>> (6 wk course)   Interim history/subjective:  Sedated on ventilator.   Still requiring low-dose norepinephrine Hematocrit stabilized posttransfusion  Does open eyes to name calling, not moving extremities  Objective   Blood pressure 91/67, pulse (!) 111, temperature 98.2 F (36.8 C), temperature source Axillary, resp. rate 15, height 5\' 7"  (1.702 m), weight 107 kg, SpO2 100 %.    Vent Mode: PRVC FiO2 (%):  [30 %] 30 % Set Rate:  [15 bmp] 15 bmp Vt Set:  [370 mL] 370 mL PEEP:  [5 cmH20] 5 cmH20 Plateau Pressure:  [12 cmH20-16 cmH20] 12 cmH20   Intake/Output Summary (Last 24 hours) at 03/05/2019 0910 Last data filed at 03/05/2019 0800 Gross per 24 hour  Intake 1046.74 ml  Output 650 ml  Net 396.74 ml   03/07/2019  Weights   02/24/19 1030 03/01/19 1343  Weight: 107 kg 107 kg    Examination: General: Chronically ill, malnourished  HEENT: Temporal wasting, endotracheal tube in place Pulmonary: Clear breath sounds bilaterally Cardiac: S1-S2 appreciated, systolic murmur Abdomen: Bowel sounds appreciated Extremities: Chronic lower extremity edema, AKA on right, wound VAC intact  GU: Cloudy urine Neuro: [facility] Problem list     Assessment & Plan:   Postoperative  respiratory failure -Continue ventilator support -Wean as tolerated -VAP bundle in place -Follow radiological changes  Severe anemia -Hematocrit stabilized posttransfusion -Coagulopathy reversed -Transfuse as needed -CT abdomen did not reveal hematoma  Circulatory shock -Still requiring norepinephrine to keep MAP greater than 65 -Continue antibiotics   E. coli bacteremia -6 weeks of antibiotics is the plan  Chronic kidney disease stage III -Avoid nephrotoxic's -Trend electrolytes -Strict I's and O's  Atrial fibrillation Sick sinus syndrome, pacemaker in place -Rate control -Telemetry monitoring  Protein calorie malnutrition -Trickle feeds  Debility -Family declined skilled nursing facility  Patient does require further surgery  Will likely need tracheostomy   Best practice:  Diet: Advance tube feeds Pain/Anxiety/Delirium protocol (if indicated): Precedex, fentanyl VAP protocol (if indicated): In place DVT prophylaxis: SCD GI prophylaxis: Protonix Glucose control: SSI Mobility: Bedrest Code Status: Full code Family Communication: Pending Disposition: Critically ill, stabilized posttransfusion, coagulopathy corrected Continue lines of care Risk of decompensation remains very high   The patient is critically ill with multiple organ systems failure and requires high complexity decision making for assessment and support, frequent evaluation and titration of therapies, application of advanced monitoring technologies and extensive interpretation of multiple databases. Critical Care Time devoted to patient care services described in this note independent of APP/resident time (if applicable)  is 35 minutes.   Sherrilyn Rist MD [place] [facility] Personal pager: [phone] If unanswered, please page CCM On-call: [phone]

## 2019-03-06 DIAGNOSIS — J9600 Acute respiratory failure, unspecified whether with hypoxia or hypercapnia: Secondary | ICD-10-CM

## 2019-03-06 DIAGNOSIS — R7881 Bacteremia: Secondary | ICD-10-CM | POA: Diagnosis not present

## 2019-03-06 DIAGNOSIS — B9620 Unspecified Escherichia coli [E. coli] as the cause of diseases classified elsewhere: Secondary | ICD-10-CM | POA: Diagnosis not present

## 2019-03-06 LAB — PROTIME-INR
INR: 1.5 — ABNORMAL HIGH (ref 0.8–1.2)
Prothrombin Time: 18.1 seconds — ABNORMAL HIGH (ref 11.4–15.2)

## 2019-03-06 LAB — GLUCOSE, CAPILLARY
Glucose-Capillary: 101 mg/dL — ABNORMAL HIGH (ref 70–99)
Glucose-Capillary: 120 mg/dL — ABNORMAL HIGH (ref 70–99)
Glucose-Capillary: 161 mg/dL — ABNORMAL HIGH (ref 70–99)
Glucose-Capillary: 161 mg/dL — ABNORMAL HIGH (ref 70–99)
Glucose-Capillary: 128 mg/dL — ABNORMAL HIGH (ref 70–99)
Glucose-Capillary: 168 mg/dL — ABNORMAL HIGH (ref 70–99)

## 2019-03-06 LAB — MAGNESIUM: Magnesium: 1.9 mg/dL (ref 1.7–2.4)

## 2019-03-06 LAB — PHOSPHORUS: Phosphorus: 1.4 mg/dL — ABNORMAL LOW (ref 2.5–4.6)

## 2019-03-06 LAB — CBC WITH DIFFERENTIAL/PLATELET
Abs Immature Granulocytes: 0.17 10*3/uL — ABNORMAL HIGH (ref 0.00–0.07)
Basophils Absolute: 0 10*3/uL (ref 0.0–0.1)
Basophils Relative: 0 %
Eosinophils Absolute: 0 10*3/uL (ref 0.0–0.5)
Eosinophils Relative: 0 %
HCT: 23.5 % — ABNORMAL LOW (ref 36.0–46.0)
Hemoglobin: 7.4 g/dL — ABNORMAL LOW (ref 12.0–15.0)
Immature Granulocytes: 2 %
Lymphocytes Relative: 16 %
Lymphs Abs: 1.4 10*3/uL (ref 0.7–4.0)
MCH: 30.1 pg (ref 26.0–34.0)
MCHC: 31.5 g/dL (ref 30.0–36.0)
MCV: 95.5 fL (ref 80.0–100.0)
Monocytes Absolute: 0.7 10*3/uL (ref 0.1–1.0)
Monocytes Relative: 8 %
Neutro Abs: 6.2 10*3/uL (ref 1.7–7.7)
Neutrophils Relative %: 74 %
Platelets: 176 10*3/uL (ref 150–400)
RBC: 2.46 MIL/uL — ABNORMAL LOW (ref 3.87–5.11)
RDW: 16.7 % — ABNORMAL HIGH (ref 11.5–15.5)
WBC: 8.5 10*3/uL (ref 4.0–10.5)
nRBC: 0 % (ref 0.0–0.2)

## 2019-03-06 LAB — CORTISOL: Cortisol, Plasma: 16 ug/dL

## 2019-03-06 LAB — SURGICAL PATHOLOGY

## 2019-03-06 MED ORDER — POTASSIUM PHOSPHATES 15 MMOLE/5ML IV SOLN
30.0000 mmol | Freq: Once | INTRAVENOUS | Status: AC
  Administered 2019-03-06: 30 mmol via INTRAVENOUS
  Filled 2019-03-06: qty 10

## 2019-03-06 MED ORDER — PRO-STAT SUGAR FREE PO LIQD
60.0000 mL | Freq: Two times a day (BID) | ORAL | Status: DC
  Administered 2019-03-06 – 2019-03-08 (×4): 60 mL
  Filled 2019-03-06 (×5): qty 60

## 2019-03-06 MED ORDER — VITAL HIGH PROTEIN PO LIQD
1000.0000 mL | ORAL | Status: DC
  Administered 2019-03-06 – 2019-03-07 (×2): 1000 mL

## 2019-03-06 MED ORDER — HYDROCORTISONE NA SUCCINATE PF 100 MG IJ SOLR
50.0000 mg | Freq: Four times a day (QID) | INTRAMUSCULAR | Status: DC
  Administered 2019-03-06 – 2019-03-08 (×8): 50 mg via INTRAVENOUS
  Filled 2019-03-06 (×8): qty 2

## 2019-03-06 MED ORDER — HYDROCORTISONE NA SUCCINATE PF 100 MG IJ SOLR
100.0000 mg | Freq: Once | INTRAMUSCULAR | Status: AC
  Administered 2019-03-06: 100 mg via INTRAVENOUS
  Filled 2019-03-06: qty 2

## 2019-03-06 NOTE — Progress Notes (Signed)
Patient ID: Robin Snow, female   DOB: 1940/06/23, 78 y.o.   MRN: [id number] No drainage in the wound VAC canister vital signs reviewed on the comprehensive in flowsheet.  Still on ceftriaxone.

## 2019-03-06 NOTE — Progress Notes (Signed)
Nutrition Follow-up  DOCUMENTATION CODES:   Obesity unspecified  INTERVENTION:   Increase Vital High Protein to 45 ml/hr (1080 ml/day) with 60 ml Prostat BID  Provides: 1480 kcal, 154 grams protein, and 902 ml free water.    NUTRITION DIAGNOSIS:   Increased nutrient needs related to wound healing as evidenced by estimated needs. Ongoing.   GOAL:   Provide needs based on ASPEN/SCCM guidelines Progressing   MONITOR:   Vent status, Weight trends, Labs, TF tolerance, Skin, I & O's  REASON FOR ASSESSMENT:   Consult, Ventilator Enteral/tube feeding initiation and management  ASSESSMENT:   Pt with PMH of DM, HTN, morbid obesity, and GER who had R knee replacement s/p revision in 2018 now admitted with prosthetic dislocation and abscess s/p closed reduction and drainage 11/18 treating for e.coli bacteremia now s/p R AKA 11/25 with wound VAC.    Pt intubated post procedure for respiratory failure Pt treated for circulatory shock due to hemorrhagic/hypovolemic from ABLA, CT of abd pending for possible retroperitoneal hemorrhage Pt will still need to return to the OR for hip disarticulation when stable. Per Cardiology pt will have TEE rescheduled.   11/28 trickle TF started  Patient is currently intubated on ventilator support MV: 5.6 L/min Temp (24hrs), Avg:99.4 F (37.4 C), Min:98.5 F (36.9 C), Max:100.5 F (38.1 C)  Medications reviewed and include: vitamin D, ferrous sulfate, solu-cortef, senokot  Levo @ 3 mcg  Kphos x 1  Labs reviewed: PO4: 1.4 (L)  16 F NG tube  Moderate edema noted  Drain: 0  TF: Vital High Protein @ 40 with 30 ml Prostat BID Provides: 1160 kcal and 114 grams protien  Diet Order:   Diet Order            Diet NPO time specified  Diet effective now              EDUCATION NEEDS:   No education needs have been identified at this time  Skin:  Skin Assessment: Skin Integrity Issues: Skin Integrity Issues:: Stage II, Incisions Stage II:  buttocks, R thigh Incisions: R AKA with VAC  Last BM:  02/25/19  Height:   Ht Readings from Last 1 Encounters:  03/01/19 5\' 7"  (1.702 m)    Weight:   Wt Readings from Last 1 Encounters:  03/01/19 107 kg    Ideal Body Weight:  56.3 kg(adjusted for R AKA)  BMI:  Body mass index is 36.95 kg/m.  Estimated Nutritional Needs:   Kcal:  1300-1500  Protein:  150 grams  Fluid:  >1.5 L/day   Maylon Peppers RD, LDN, CNSC [phone] Pager [phone] After Hours Pager

## 2019-03-06 NOTE — Progress Notes (Addendum)
NAME:  Robin Snow, MRN:  [id number], DOB:  1940/05/27, LOS: 11 ADMISSION DATE:  02/22/2019, CONSULTATION DATE: 11/25 REFERRING MD: Dr. Margo Aye, CHIEF COMPLAINT: Postop respiratory failure, s/p BKA for septic knee prosthesis  Brief History   78 yo F with R knee replacement, s/p revision in 2018, presenting with prosthetic dislocation and abscess. S/p closed reduction, drainage 11/18. Treating for e.coli bacteremia. Underwent R AKA 11/25 and was intubated after procedure for respiratory failure.  Past Medical History  has Permanent atrial fibrillation (HCC); GERD (gastroesophageal reflux disease); Embolic stroke (HCC); Right hemiparesis (HCC); Chronic venous insufficiency; Essential hypertension; HLD (hyperlipidemia); Diabetes mellitus with complication (HCC); Anemia; CKD (chronic kidney disease) stage 3, GFR 30-59 ml/min; Chronic pain syndrome; Painful total knee replacement (HCC); Physical deconditioning; Sinus bradycardia; Orthostatic hypotension; Supratherapeutic INR; Chronic diastolic CHF (congestive heart failure) (HCC); Sinus pause: 4.02-4.80sec per telemetry 09/08/2016; Tachy-brady syndrome (HCC); Long term (current) use of anticoagulants; Obstructive sleep apnea; Urinary frequency; Pre-op evaluation; Warm antibody hemolytic anemia; Foot pain; Sepsis (HCC); Atrial fibrillation with RVR (HCC); Acute cystitis with hematuria; Closed dislocation of right knee; Effusion of right knee joint; AF (paroxysmal atrial fibrillation) (HCC); Bacteremia due to Escherichia coli; Iron deficiency anemia due to chronic blood loss; Dislocation of right knee; Osteomyelitis of right knee region [facility]); Severe protein-calorie malnutrition (HCC); Abscess of thigh; Chronic osteomyelitis of right femur with draining sinus (HCC); Infection of total right knee replacement (HCC); and Pressure injury of skin on their problem list.   Significant Hospital Events   11/18 admitted. R knee closed reduction, drainage 11/21 ongoing  tx for e coli bacteremia 11/23 PT rec dc to SNF when appropriate, family refuses. ID consulted for abx duration 11/24 ID recommend TEE, cardiology consulted. 11/25 R AKA for septic abscess and osteomyelitis . Reintubated following surgery. To ICU  11/26 hypotensive. Got transfused (total of 4 units); pressor dependent. Surgery noting she would still need hip disarticulation once stable 11/27: hgb down from 7.5 to 4.8 overnight. Still sig output from surgical site drain.3 units PRBC. 2 units FFP 11/29: Hemoglobin has been stable  Consults:  ID Ortho PCCM Cardiology  Procedures:  11/18> closed reduction of R knee prosthetic, drainage 11/25 R AKA 11/25 ETT>>>   Significant Diagnostic Tests:  11/18 R knee plain film: Large R knee effusion, anterior dislocation of femoral component to tibial component  11/27 CT abd/pelvis-IMPRESSION: 1. No evidence of retroperitoneal hemorrhage. 2. Mild colonic ileus.  No evidence of bowel obstruction. 3. Moderate diffuse body wall edema. 4. Moderate left renal parenchymal atrophy.  Micro Data:  11/18 SARS Cov2> neg  11/18 synovial fluid: Moderate E.Coli 11/18 BCx: e.coli 11/20 BCx> neg   Antimicrobials:  Rocephin 11/19>>> (6 wk course)   Interim history/subjective:  Remains on low dose levo. Comfortable.  Objective   Blood pressure (!) 126/57, pulse (!) 46, temperature 99 F (37.2 C), temperature source Axillary, resp. rate 12, height 5\' 7"  (1.702 m), weight 107 kg, SpO2 100 %.    Vent Mode: PRVC FiO2 (%):  [30 %] 30 % Set Rate:  [15 bmp] 15 bmp Vt Set:  [370 mL] 370 mL PEEP:  [5 cmH20] 5 cmH20 Pressure Support:  [5 cmH20] 5 cmH20 Plateau Pressure:  [10 cmH20-15 cmH20] 14 cmH20   Intake/Output Summary (Last 24 hours) at 03/06/2019 0730 Last data filed at 03/06/2019 0700 Gross per 24 hour  Intake 1498.29 ml  Output 1350 ml  Net 148.29 ml   Filed Weights   02/24/19 1030 03/01/19 1343  Weight: 107  kg 107 kg    Examination:   General: Chronically ill adult female, malnourished  HEENT: Temporal wasting, endotracheal tube in place Pulmonary: Clear breath sounds bilaterally Cardiac: IRIR, no M/R/G Abdomen: Bowel sounds appreciated Extremities: Chronic lower extremity edema, AKA on right, wound VAC intact  GU: Cloudy urine Neuro: Sedated but arousable, does not follow all commands   Assessment & Plan:   Postoperative respiratory failure -Continue ventilator support -Wean as tolerated, will hold on extubation until after TEE and then will need to clarify timing of hip disarticulation surgery with ortho -VAP bundle in place -Follow CXR  Severe anemia - stabilized post transfusion.  CT neg for hematoma. -Transfuse as needed  Circulatory shock - due to E.coli bacteremia 2/2 R knee abscess/osteo, s/p AKA. -Continue norepinephrine to keep MAP greater than 65 -Start empiric stress steroids in hopes of getting off levo -Check cortisol, if > 20 then can d/c steroids -Continue antibiotics -TEE at some point to exclude PPM infection (was deferred last week due to OR and anemia) -Per ortho, will require hip disarticulation at some point  E. coli bacteremia -ID following, 6 weeks of antibiotics is the plan  Chronic kidney disease stage III -Avoid nephrotoxic's -Trend electrolytes -Strict I's and O's  Hypophosphatemia -Replete Kphos  Atrial fibrillation Sick sinus syndrome s/p pacemaker -No AC due to bleeding / anemia -Telemetry monitoring  Protein calorie malnutrition -Trickle feeds  Debility -Family declined skilled nursing facility   Best practice:  Diet: Tube Feeds Pain/Anxiety/Delirium protocol (if indicated): Precedex, fentanyl VAP protocol (if indicated): In place DVT prophylaxis: SCD GI prophylaxis: Protonix Glucose control: SSI Mobility: Bedrest Code Status: Full code Family Communication: Will call Disposition: ICU   CC time: 35 min.   Montey Hora, [place] [facility] 03/06/2019, 7:53 AM

## 2019-03-06 NOTE — Progress Notes (Signed)
[facility] for Infectious Disease  Date of Admission:  02/22/2019      Total days of antibiotics 13  Day 13 Ceftriaxone           ASSESSMENT: Robin Snow is a 78 y.o. female with recurrent Ecoli bacteremia with large abscess compromising right knee PJI extending up to her hip; this has been complicated by excessive bleeding/hematoma as she is on chronic anticoagulation with warfarin. She was taken for urgent AKA with [name] 11/25. She will likely require further surgery with consideration of right hip disarticulation as planned by [name]. Continue ceftriaxone for now, no new micro information.   TEE pending to evaluate her ICD leads to ensure shorter course of antibiotics is safe. On hold now with instability/intubation.   Respiratory failure following surgery - PCCM following. May require trach  PLAN: Continue Ceftriaxone IV   Follow for timing of TEE to help with duration of antibiotics should she undergo more definitive surgery to control her hip infection   Principal Problem:   Bacteremia due to Escherichia coli Active Problems:   Effusion of right knee joint   Diabetes mellitus with complication (HCC)   Anemia   Supratherapeutic INR   Sinus pause: 4.02-4.80sec per telemetry 09/08/2016   Sepsis ([place])   Closed dislocation of right knee   AF (paroxysmal atrial fibrillation) (HCC)   Iron deficiency anemia due to chronic blood loss   Dislocation of right knee   Osteomyelitis of right knee region ([place])   Severe protein-calorie malnutrition (HCC)   Abscess of thigh   Chronic osteomyelitis of right femur with draining sinus ([place])   Infection of total right knee replacement (HCC)   Pressure injury of skin   Acute respiratory failure with hypoxia ([place])   . sodium chloride   Intravenous Once  . sodium chloride   Intravenous Once  . sodium chloride   Intravenous Once  . sodium chloride   Intravenous Once  . sodium chloride   Intravenous Once  .  sodium chloride   Intravenous Once  . chlorhexidine gluconate (MEDLINE KIT)  15 mL Mouth Rinse BID  . Chlorhexidine Gluconate Cloth  6 each Topical Daily  . cholecalciferol  2,000 Units Per Tube BID  . feeding supplement (PRO-STAT SUGAR FREE 64)  30 mL Per Tube BID  . feeding supplement (VITAL HIGH PROTEIN)  1,000 mL Per Tube Q24H  . ferrous sulfate  300 mg Per Tube BID WC  . gabapentin  300 mg Per Tube TID  . hydrocortisone sod succinate (SOLU-CORTEF) inj  50 mg Intravenous Q6H  . insulin aspart  0-15 Units Subcutaneous Q4H  . mouth rinse  15 mL Mouth Rinse 10 times per day  . pantoprazole (PROTONIX) IV  40 mg Intravenous Q12H  . pravastatin  20 mg Per Tube q1800  . senna-docusate  2 tablet Per Tube BID  . sodium chloride flush  3 mL Intravenous Q12H  . Vitamin D (Ergocalciferol)  50,000 Units Per Tube Q7 days    SUBJECTIVE: Intubated  TEE cancelled/on hold given current critical illness.  Febrile o/n 100.5 F with normalization of WBC counts.  FiO2 stable 30% with CXR from 11/28 unremarkable for infection.   Review of Systems: Review of Systems  Unable to perform ROS: Intubated    Allergies  Allergen Reactions  . Aspirin Hives and Swelling    Angioedema   . Rofecoxib Hives  . Hydrocodone Hives    Tolerates oxycodone  and tramadol    OBJECTIVE: Vitals:   03/06/19 0700 03/06/19 0800 03/06/19 0848 03/06/19 0900  BP: (!) 126/57 (!) 127/57 (!) 118/59 104/63  Pulse: (!) 46 (!) 46 68 (!) 46  Resp: _0 Temp: (!) 100.5 F (38.1 C)     TempSrc: Axillary     SpO2: 100% 100%  100%  Weight:      Height:       Body mass index is 36.95 kg/m.  Physical Exam Constitutional:      Appearance: She is well-developed.     Comments: Resting quietly in bed. On ventilator and sedated.   HENT:     Mouth/Throat:     Mouth: No oral lesions.     Dentition: Normal dentition. No dental abscesses.  Cardiovascular:     Rate and Rhythm: Normal rate and regular rhythm.      Heart sounds: Normal heart sounds. No murmur.     Comments: AV paced on telemetry, PACs Pulmonary:     Effort: Pulmonary effort is normal.     Breath sounds: Normal breath sounds.  Abdominal:     General: There is no distension.     Palpations: Abdomen is soft.     Tenderness: There is no abdominal tenderness.  Musculoskeletal:     Comments: R AKA wound vac sponge depressed. No drainage noted in canister. No warmth superior to incision along leg.   Skin:    General: Skin is warm and dry.     Capillary Refill: Capillary refill takes less than 2 seconds.     Findings: No rash.  Neurological:     Mental Status: She is oriented to person, place, and time.  Psychiatric:        Judgment: Judgment normal.     Lab Results Lab Results  Component Value Date   WBC 8.5 03/06/2019   HGB 7.4 (L) 03/06/2019   HCT 23.5 (L) 03/06/2019   MCV 95.5 03/06/2019   PLT 176 03/06/2019    Lab Results  Component Value Date   CREATININE 0.92 03/05/2019   BUN 18 03/05/2019   NA 142 03/05/2019   K 4.1 03/05/2019   CL 107 03/05/2019   CO2 25 03/05/2019    Lab Results  Component Value Date   ALT 13 02/04/2019   AST 15 02/04/2019   ALKPHOS 79 02/04/2019   BILITOT 0.9 02/04/2019     Microbiology: No results found for this or any previous visit (from the past 240 hour(s)).  Janene Madeira, MSN, NP-C [facility] for Infectious Disease [email or web address] Pager: [phone] Office: [phone] [place]: [phone]

## 2019-03-06 NOTE — Progress Notes (Signed)
Pt placed back to full vent support on documented settings to rest during the night.  Pt tolerating well

## 2019-03-07 ENCOUNTER — Inpatient Hospital Stay (HOSPITAL_COMMUNITY): Payer: Medicare HMO

## 2019-03-07 DIAGNOSIS — J95821 Acute postprocedural respiratory failure: Secondary | ICD-10-CM

## 2019-03-07 DIAGNOSIS — R159 Full incontinence of feces: Secondary | ICD-10-CM

## 2019-03-07 DIAGNOSIS — Z885 Allergy status to narcotic agent status: Secondary | ICD-10-CM

## 2019-03-07 DIAGNOSIS — Z886 Allergy status to analgesic agent status: Secondary | ICD-10-CM

## 2019-03-07 DIAGNOSIS — Z7901 Long term (current) use of anticoagulants: Secondary | ICD-10-CM

## 2019-03-07 DIAGNOSIS — B9620 Unspecified Escherichia coli [E. coli] as the cause of diseases classified elsewhere: Secondary | ICD-10-CM | POA: Diagnosis not present

## 2019-03-07 DIAGNOSIS — R7881 Bacteremia: Secondary | ICD-10-CM | POA: Diagnosis not present

## 2019-03-07 LAB — BPAM RBC
Blood Product Expiration Date: 202012062359
Blood Product Expiration Date: 202012092359
Blood Product Expiration Date: 202012202359
Blood Product Expiration Date: 202012202359
ISSUE DATE / TIME: 202011271310
Unit Type and Rh: 600
Unit Type and Rh: 600
Unit Type and Rh: 600
Unit Type and Rh: 600

## 2019-03-07 LAB — TYPE AND SCREEN
ABO/RH(D): A POS
Antibody Screen: POSITIVE
Donor AG Type: NEGATIVE
Donor AG Type: NEGATIVE
Donor AG Type: NEGATIVE
Donor AG Type: NEGATIVE
Unit division: 0
Unit division: 0
Unit division: 0
Unit division: 0

## 2019-03-07 LAB — CBC WITH DIFFERENTIAL/PLATELET
Abs Immature Granulocytes: 0.09 10*3/uL — ABNORMAL HIGH (ref 0.00–0.07)
Basophils Absolute: 0 10*3/uL (ref 0.0–0.1)
Basophils Relative: 0 %
Eosinophils Absolute: 0 10*3/uL (ref 0.0–0.5)
Eosinophils Relative: 0 %
HCT: 22.8 % — ABNORMAL LOW (ref 36.0–46.0)
Hemoglobin: 7.1 g/dL — ABNORMAL LOW (ref 12.0–15.0)
Immature Granulocytes: 1 %
Lymphocytes Relative: 16 %
Lymphs Abs: 1.2 10*3/uL (ref 0.7–4.0)
MCH: 29.8 pg (ref 26.0–34.0)
MCHC: 31.1 g/dL (ref 30.0–36.0)
MCV: 95.8 fL (ref 80.0–100.0)
Monocytes Absolute: 0.4 10*3/uL (ref 0.1–1.0)
Monocytes Relative: 6 %
Neutro Abs: 5.9 10*3/uL (ref 1.7–7.7)
Neutrophils Relative %: 77 %
Platelets: 162 10*3/uL (ref 150–400)
RBC: 2.38 MIL/uL — ABNORMAL LOW (ref 3.87–5.11)
RDW: 16.8 % — ABNORMAL HIGH (ref 11.5–15.5)
WBC: 7.7 10*3/uL (ref 4.0–10.5)
nRBC: 0 % (ref 0.0–0.2)

## 2019-03-07 LAB — GLUCOSE, CAPILLARY
Glucose-Capillary: 143 mg/dL — ABNORMAL HIGH (ref 70–99)
Glucose-Capillary: 136 mg/dL — ABNORMAL HIGH (ref 70–99)
Glucose-Capillary: 157 mg/dL — ABNORMAL HIGH (ref 70–99)
Glucose-Capillary: 120 mg/dL — ABNORMAL HIGH (ref 70–99)
Glucose-Capillary: 136 mg/dL — ABNORMAL HIGH (ref 70–99)
Glucose-Capillary: 141 mg/dL — ABNORMAL HIGH (ref 70–99)

## 2019-03-07 LAB — PROTIME-INR
INR: 1.5 — ABNORMAL HIGH (ref 0.8–1.2)
Prothrombin Time: 18.1 seconds — ABNORMAL HIGH (ref 11.4–15.2)

## 2019-03-07 LAB — MAGNESIUM: Magnesium: 1.9 mg/dL (ref 1.7–2.4)

## 2019-03-07 LAB — BASIC METABOLIC PANEL
Anion gap: 8 (ref 5–15)
BUN: 27 mg/dL — ABNORMAL HIGH (ref 8–23)
CO2: 27 mmol/L (ref 22–32)
Calcium: 8 mg/dL — ABNORMAL LOW (ref 8.9–10.3)
Chloride: 110 mmol/L (ref 98–111)
Creatinine, Ser: 0.75 mg/dL (ref 0.44–1.00)
GFR calc Af Amer: >60 mL/min (ref >60)
GFR calc non Af Amer: >60 mL/min (ref >60)
Glucose, Bld: 164 mg/dL — ABNORMAL HIGH (ref 70–99)
Potassium: 4.2 mmol/L (ref 3.5–5.1)
Sodium: 145 mmol/L (ref 135–145)

## 2019-03-07 LAB — PHOSPHORUS: Phosphorus: 2.2 mg/dL — ABNORMAL LOW (ref 2.5–4.6)

## 2019-03-07 MED ORDER — PANTOPRAZOLE SODIUM 40 MG PO PACK
40.0000 mg | PACK | Freq: Two times a day (BID) | ORAL | Status: DC
  Administered 2019-03-07 – 2019-03-08 (×2): 40 mg
  Filled 2019-03-07 (×2): qty 20

## 2019-03-07 MED ORDER — POTASSIUM PHOSPHATES 15 MMOLE/5ML IV SOLN
20.0000 mmol | Freq: Once | INTRAVENOUS | Status: AC
  Administered 2019-03-07: 20 mmol via INTRAVENOUS
  Filled 2019-03-07: qty 6.67

## 2019-03-07 MED ORDER — FENTANYL CITRATE (PF) 100 MCG/2ML IJ SOLN
INTRAMUSCULAR | Status: AC
  Administered 2019-03-07: 50 ug
  Filled 2019-03-07: qty 2

## 2019-03-07 MED ORDER — FENTANYL CITRATE (PF) 100 MCG/2ML IJ SOLN
25.0000 ug | INTRAMUSCULAR | Status: DC | PRN
  Administered 2019-03-08 (×3): 25 ug via INTRAVENOUS
  Filled 2019-03-07 (×3): qty 2

## 2019-03-07 NOTE — Progress Notes (Signed)
Pt placed back on documented full support vent setting to allow to rest.

## 2019-03-07 NOTE — Consult Note (Signed)
WOC Nurse Consult Note: Patient receiving care in [facility] 4N16.  I spoke with her primary RN, Lauren by telephone.  Consult completed remotely after review of record and phone conversation with Lauren. Reason for Consult: pressure wounds Wound type: stage 2 to buttocks and posterior upper thigh Pressure Injury POA: Yes Measurement, Wound bed: pink, shiny, no other color in wound beds; Drainage (amount, consistency, odor), Periwound: see flowsheet details Dressing procedure/placement/frequency: I activated the Skin Care Standing order set for stage 2 wounds. Monitor the wound area(s) for worsening of condition such as: Signs/symptoms of infection,  Increase in size,  Development of or worsening of odor, Development of pain, or increased pain at the affected locations.  Notify the medical team if any of these develop.  Thank you for the consult.  Discussed plan of care with the bedside nurse.  [place] nurse will not follow at this time.  Please re-consult the [place] team if needed.  Val Riles, RN, MSN, CWOCN, CNS-BC, pager [phone]

## 2019-03-07 NOTE — Progress Notes (Addendum)
NAME:  Robin Snow, MRN:  [id number], DOB:  Oct 22, 1940, LOS: 29 ADMISSION DATE:  02/22/2019, CONSULTATION DATE: 11/25 REFERRING MD: Dr. Nevada Crane, CHIEF COMPLAINT: Postop respiratory failure, s/p BKA for septic knee prosthesis  Brief History   78 yo F with R knee replacement, s/p revision in 2018, presenting with prosthetic dislocation and abscess. S/p closed reduction, drainage 11/18. Treating for e.coli bacteremia. Underwent R AKA 11/25 and was intubated after procedure for respiratory failure.  Past Medical History  has Permanent atrial fibrillation ([place]); GERD (gastroesophageal reflux disease); Embolic stroke ([place]); Right hemiparesis ([place]); Chronic venous insufficiency; Essential hypertension; HLD (hyperlipidemia); Diabetes mellitus with complication ([place]); Anemia; CKD (chronic kidney disease) stage 3, GFR 30-59 ml/min; Chronic pain syndrome; Painful total knee replacement ([place]); Physical deconditioning; Sinus bradycardia; Orthostatic hypotension; Supratherapeutic INR; Chronic diastolic CHF (congestive heart failure) ([place]); Sinus pause: 4.02-4.80sec per telemetry 09/08/2016; Tachy-brady syndrome ([place]); Long term (current) use of anticoagulants; Obstructive sleep apnea; Urinary frequency; Pre-op evaluation; Warm antibody hemolytic anemia; Foot pain; Sepsis ([place]); Atrial fibrillation with RVR ([place]); Acute cystitis with hematuria; Closed dislocation of right knee; Effusion of right knee joint; AF (paroxysmal atrial fibrillation) ([place]); Bacteremia due to Escherichia coli; Iron deficiency anemia due to chronic blood loss; Dislocation of right knee; Osteomyelitis of right knee region [facility]); Severe protein-calorie malnutrition ([place]); Abscess of thigh; Chronic osteomyelitis of right femur with draining sinus ([place]); Infection of total right knee replacement ([place]); Pressure injury of skin; and Acute respiratory failure with hypoxia ([place]) on their problem list.   [place] Hospital Events   11/18 admitted. R  knee closed reduction, drainage 11/21 ongoing tx for e coli bacteremia 11/23 PT rec dc to SNF when appropriate, family refuses. ID consulted for abx duration 11/24 ID recommend TEE, cardiology consulted. 11/25 R AKA for septic abscess and osteomyelitis . Reintubated following surgery. To ICU  11/26 hypotensive. Got transfused (total of 4 units); pressor dependent. Surgery noting she would still need hip disarticulation once stable 11/27: hgb down from 7.5 to 4.8 overnight. Still sig output from surgical site drain.3 units PRBC. 2 units FFP 11/29: Hemoglobin has been stable 12/1 Off pressors, weaning on PSV  Consults:  ID Ortho PCCM Cardiology  Procedures:  11/18> closed reduction of R knee prosthetic, drainage 11/25 R AKA 11/25 ETT>>>   Significant Diagnostic Tests:  11/18 R knee plain film: Large R knee effusion, anterior dislocation of femoral component to tibial component   11/27 CT abd/pelvis- 1. No evidence of retroperitoneal hemorrhage. 2. Mild colonic ileus.  No evidence of bowel obstruction. 3. Moderate diffuse body wall edema. 4. Moderate left renal parenchymal atrophy.  Micro Data:  11/18 SARS Cov2> neg  11/18 synovial fluid: Moderate E.Coli 11/18 BCx: e.coli 11/20 BCx> neg   Antimicrobials:  Rocephin 11/19>>> (6 wk course)   Interim history/subjective:  Off pressors, weaning on PSV  Objective   Blood pressure 135/71, pulse 71, temperature 97.7 F (36.5 C), temperature source Axillary, resp. rate 11, height 5\' 7"  (1.702 m), weight 106.5 kg, SpO2 100 %.    Vent Mode: PSV;CPAP FiO2 (%):  [30 %] 30 % Set Rate:  [15 bmp] 15 bmp Vt Set:  [370 mL] 370 mL PEEP:  [5 cmH20] 5 cmH20 Pressure Support:  [5 cmH20] 5 cmH20 Plateau Pressure:  [12 cmH20-14 cmH20] 14 cmH20   Intake/Output Summary (Last 24 hours) at 03/07/2019 0945 Last data filed at 03/07/2019 0600 Gross per 24 hour  Intake 1699.27 ml  Output 800 ml  Net 899.27 ml   Filed  Weights   02/24/19  1030 03/01/19 1343 03/07/19 0500  Weight: 107 kg 107 kg 106.5 kg    Examination: . Gen:      No acute distress HEENT:  EOMI, sclera anicteric Neck:     No masses; no thyromegaly Lungs:    Clear to auscultation bilaterally; normal respiratory effort CV:         Regular rate and rhythm; no murmurs Abd:      + bowel sounds; soft, non-tender; no palpable masses, no distension Ext:    Rt AKA with wound vac Skin:      Warm and dry; no rash Neuro: Sedated, arousable  Assessment & Plan:   Postoperative respiratory failure - PSV weans as tolerated - Follow intermittent chest x-ray - Can be extubated once timing for TEE is clarified  Severe anemia - stabilized post transfusion.  CT neg for hematoma. -Transfuse as needed  Circulatory shock - due to E.coli bacteremia 2/2 R knee abscess/osteo, s/p AKA. -Continue stress dose steroids -Continue antibiotics -TEE to exclude PPM infection (was deferred last week due to OR and anemia) -Per ortho, will require hip disarticulation at some point but not urgent. Can extubate per ortho  E. coli bacteremia -ID following, 6 weeks of antibiotics is the plan  Chronic kidney disease stage III -Avoid nephrotoxic's -Trend electrolytes -Strict I's and O's  Hypophosphatemia -Replete Kphos  Atrial fibrillation Sick sinus syndrome s/p pacemaker -No AC due to bleeding / anemia -Telemetry monitoring  Protein calorie malnutrition -Tube feeds  Debility -Family declined skilled nursing facility  Best practice:  Diet: Tube Feeds Pain/Anxiety/Delirium protocol (if indicated): Precedex, fentanyl VAP protocol (if indicated): In place DVT prophylaxis: SCD GI prophylaxis: Protonix Glucose control: SSI Mobility: Bedrest Code Status: Full code Family Communication: Daughter [name] called and updated 12/1 Disposition: ICU  The patient is critically ill with multiple organ system failure and requires high complexity decision making for assessment and  support, frequent evaluation and titration of therapies, advanced monitoring, review of radiographic studies and interpretation of complex data.   Critical Care Time devoted to patient care services, exclusive of separately billable procedures, described in this note is 35 minutes.   Chilton Greathouse MD [place] [facility] Pager [phone] If no answer call [phone] 03/07/2019, 9:48 AM

## 2019-03-07 NOTE — Progress Notes (Signed)
[place] for Infectious Disease  Date of Admission:  02/22/2019      Total days of antibiotics 14  Day 14 Ceftriaxone           ASSESSMENT: Robin Snow is a 78 y.o. female with recurrent Ecoli bacteremia now with large abscess compromising right total knee prosthesis with extension up to her hip now s/p AKA on 11/25. This has been complicated by excessive bleeding/hematoma in the setting of chronic anticoagulation with warfarin.  She will likely require further surgery with consideration for right hip disarticulation per [name]. Continue ceftriaxone until further surgical planning can be determined to help with duration of antibiotics.  TEE pending to evaluate her ICD leads to ensure shorter course of antibiotics is safe. On hold for now given critical illness.   Medication Monitoring -   Respiratory failure following surgery - PCCM following. She is much more awake today and able to nod appropriately. Weaning on low dose precedex.     PLAN: 1. Continue Ceftriaxone IV with duration pending further surgical decisions and #2.  2. Follow for timing of TEE to evaluate PPM leads    Principal Problem:   Bacteremia due to Escherichia coli Active Problems:   Effusion of right knee joint   Diabetes mellitus with complication (HCC)   Anemia   Supratherapeutic INR   Sinus pause: 4.02-4.80sec per telemetry 09/08/2016   Sepsis ([place])   Closed dislocation of right knee   AF (paroxysmal atrial fibrillation) (HCC)   Iron deficiency anemia due to chronic blood loss   Dislocation of right knee   Osteomyelitis of right knee region ([place])   Severe protein-calorie malnutrition (HCC)   Abscess of thigh   Chronic osteomyelitis of right femur with draining sinus ([place])   Infection of total right knee replacement (HCC)   Pressure injury of skin   Acute respiratory failure with hypoxia ([place])   . sodium chloride   Intravenous Once  . sodium chloride   Intravenous Once  .  sodium chloride   Intravenous Once  . sodium chloride   Intravenous Once  . sodium chloride   Intravenous Once  . sodium chloride   Intravenous Once  . chlorhexidine gluconate (MEDLINE KIT)  15 mL Mouth Rinse BID  . Chlorhexidine Gluconate Cloth  6 each Topical Daily  . cholecalciferol  2,000 Units Per Tube BID  . feeding supplement (PRO-STAT SUGAR FREE 64)  60 mL Per Tube BID  . ferrous sulfate  300 mg Per Tube BID WC  . gabapentin  300 mg Per Tube TID  . hydrocortisone sod succinate (SOLU-CORTEF) inj  50 mg Intravenous Q6H  . insulin aspart  0-15 Units Subcutaneous Q4H  . mouth rinse  15 mL Mouth Rinse 10 times per day  . pantoprazole (PROTONIX) IV  40 mg Intravenous Q12H  . pravastatin  20 mg Per Tube q1800  . senna-docusate  2 tablet Per Tube BID  . sodium chloride flush  3 mL Intravenous Q12H  . Vitamin D (Ergocalciferol)  50,000 Units Per Tube Q7 days    SUBJECTIVE: Remains intubated. More awake and able to nod appropriately. Denies any pain.   TEE cancelled/on hold given current critical illness.  Afebrile overnight with normalization of WBC count.  FiO2 stable 30% with CXR from 11/28 unremarkable for infection.  Large liquid incontinent BM this morning.    Review of Systems: Review of Systems  Unable to perform ROS: Intubated  Allergies  Allergen Reactions  . Aspirin Hives and Swelling    Angioedema   . Rofecoxib Hives  . Hydrocodone Hives    Tolerates oxycodone and tramadol    OBJECTIVE: Vitals:   03/07/19 0800 03/07/19 0804 03/07/19 0900 03/07/19 1000  BP:   130/77 (!) 99/51  Pulse: (!) 38 71 (!) 102 (!) 47  Resp: '17 11 12 17  '$ Temp:  97.7 F (36.5 C)    TempSrc:  Axillary    SpO2: 100% 100% 100% 100%  Weight:      Height:       Body mass index is 36.77 kg/m.  Physical Exam Vitals signs and nursing note reviewed.  Constitutional:      Appearance: She is well-developed.     Comments: Resting on ventilator comfortably. Awakens to voice.    HENT:     Mouth/Throat:     Mouth: No oral lesions.     Dentition: Normal dentition. No dental abscesses.     Pharynx: No oropharyngeal exudate.  Cardiovascular:     Rate and Rhythm: Normal rate and regular rhythm.     Heart sounds: Normal heart sounds. No murmur.     Comments: AV paced on telemetry  Pulmonary:     Effort: Pulmonary effort is normal.     Breath sounds: Normal breath sounds.     Comments: Weaning currently on 30% FiO2 Abdominal:     General: There is no distension.     Palpations: Abdomen is soft.     Tenderness: There is no abdominal tenderness.  Musculoskeletal:     Comments: R AKA vac dressing dry/clean without any surrounding areas of erythema. Generalized pitting edema noted throughout b/l hips and thighs.   Lymphadenopathy:     Cervical: No cervical adenopathy.  Skin:    General: Skin is warm and dry.     Capillary Refill: Capillary refill takes less than 2 seconds.     Findings: No rash.  Neurological:     Mental Status: She is alert.     Comments: Alert. Following commands and nods to questions appropriately.   Psychiatric:        Judgment: Judgment normal.     Lab Results Lab Results  Component Value Date   WBC 7.7 03/07/2019   HGB 7.1 (L) 03/07/2019   HCT 22.8 (L) 03/07/2019   MCV 95.8 03/07/2019   PLT 162 03/07/2019    Lab Results  Component Value Date   CREATININE 0.75 03/07/2019   BUN 27 (H) 03/07/2019   NA 145 03/07/2019   K 4.2 03/07/2019   CL 110 03/07/2019   CO2 27 03/07/2019    Lab Results  Component Value Date   ALT 13 02/04/2019   AST 15 02/04/2019   ALKPHOS 79 02/04/2019   BILITOT 0.9 02/04/2019     Microbiology: No results found for this or any previous visit (from the past 240 hour(s)).  Janene Madeira, MSN, NP-C [facility] for Infectious Disease [email or web address] Pager: [phone] Office: [place]: [phone]

## 2019-03-08 ENCOUNTER — Inpatient Hospital Stay: Payer: Self-pay

## 2019-03-08 ENCOUNTER — Inpatient Hospital Stay (HOSPITAL_COMMUNITY): Payer: Medicare HMO

## 2019-03-08 DIAGNOSIS — I4891 Unspecified atrial fibrillation: Secondary | ICD-10-CM | POA: Diagnosis not present

## 2019-03-08 DIAGNOSIS — J95821 Acute postprocedural respiratory failure: Secondary | ICD-10-CM | POA: Diagnosis not present

## 2019-03-08 DIAGNOSIS — I340 Nonrheumatic mitral (valve) insufficiency: Secondary | ICD-10-CM | POA: Diagnosis not present

## 2019-03-08 DIAGNOSIS — B9620 Unspecified Escherichia coli [E. coli] as the cause of diseases classified elsewhere: Secondary | ICD-10-CM | POA: Diagnosis not present

## 2019-03-08 DIAGNOSIS — R7881 Bacteremia: Secondary | ICD-10-CM | POA: Diagnosis not present

## 2019-03-08 LAB — CBC WITH DIFFERENTIAL/PLATELET
Abs Immature Granulocytes: 0.18 10*3/uL — ABNORMAL HIGH (ref 0.00–0.07)
Basophils Absolute: 0 10*3/uL (ref 0.0–0.1)
Basophils Relative: 0 %
Eosinophils Absolute: 0 10*3/uL (ref 0.0–0.5)
Eosinophils Relative: 0 %
HCT: 23.8 % — ABNORMAL LOW (ref 36.0–46.0)
Hemoglobin: 7.2 g/dL — ABNORMAL LOW (ref 12.0–15.0)
Immature Granulocytes: 2 %
Lymphocytes Relative: 16 %
Lymphs Abs: 1.3 10*3/uL (ref 0.7–4.0)
MCH: 29.6 pg (ref 26.0–34.0)
MCHC: 30.3 g/dL (ref 30.0–36.0)
MCV: 97.9 fL (ref 80.0–100.0)
Monocytes Absolute: 0.6 10*3/uL (ref 0.1–1.0)
Monocytes Relative: 7 %
Neutro Abs: 6.2 10*3/uL (ref 1.7–7.7)
Neutrophils Relative %: 75 %
Platelets: 184 10*3/uL (ref 150–400)
RBC: 2.43 MIL/uL — ABNORMAL LOW (ref 3.87–5.11)
RDW: 16.8 % — ABNORMAL HIGH (ref 11.5–15.5)
WBC: 8.3 10*3/uL (ref 4.0–10.5)
nRBC: 0 % (ref 0.0–0.2)

## 2019-03-08 LAB — BASIC METABOLIC PANEL
Anion gap: 7 (ref 5–15)
BUN: 32 mg/dL — ABNORMAL HIGH (ref 8–23)
CO2: 29 mmol/L (ref 22–32)
Calcium: 8.1 mg/dL — ABNORMAL LOW (ref 8.9–10.3)
Chloride: 110 mmol/L (ref 98–111)
Creatinine, Ser: 0.73 mg/dL (ref 0.44–1.00)
GFR calc Af Amer: >60 mL/min (ref >60)
GFR calc non Af Amer: >60 mL/min (ref >60)
Glucose, Bld: 141 mg/dL — ABNORMAL HIGH (ref 70–99)
Potassium: 3.8 mmol/L (ref 3.5–5.1)
Sodium: 146 mmol/L — ABNORMAL HIGH (ref 135–145)

## 2019-03-08 LAB — GLUCOSE, CAPILLARY
Glucose-Capillary: 114 mg/dL — ABNORMAL HIGH (ref 70–99)
Glucose-Capillary: 106 mg/dL — ABNORMAL HIGH (ref 70–99)
Glucose-Capillary: 147 mg/dL — ABNORMAL HIGH (ref 70–99)
Glucose-Capillary: 114 mg/dL — ABNORMAL HIGH (ref 70–99)
Glucose-Capillary: 116 mg/dL — ABNORMAL HIGH (ref 70–99)
Glucose-Capillary: 109 mg/dL — ABNORMAL HIGH (ref 70–99)

## 2019-03-08 LAB — PROTIME-INR
INR: 1.5 — ABNORMAL HIGH (ref 0.8–1.2)
Prothrombin Time: 18.1 seconds — ABNORMAL HIGH (ref 11.4–15.2)

## 2019-03-08 LAB — PHOSPHORUS: Phosphorus: 2 mg/dL — ABNORMAL LOW (ref 2.5–4.6)

## 2019-03-08 LAB — MAGNESIUM: Magnesium: 1.9 mg/dL (ref 1.7–2.4)

## 2019-03-08 MED ORDER — FENTANYL CITRATE (PF) 100 MCG/2ML IJ SOLN
INTRAMUSCULAR | Status: AC
  Administered 2019-03-08: 100 ug via INTRAVENOUS
  Filled 2019-03-08: qty 2

## 2019-03-08 MED ORDER — FENTANYL CITRATE (PF) 100 MCG/2ML IJ SOLN
50.0000 ug | Freq: Once | INTRAMUSCULAR | Status: AC
  Administered 2019-03-08: 16:00:00 100 ug via INTRAVENOUS

## 2019-03-08 MED ORDER — HEPARIN SODIUM (PORCINE) 5000 UNIT/ML IJ SOLN
5000.0000 [IU] | Freq: Three times a day (TID) | INTRAMUSCULAR | Status: DC
  Administered 2019-03-08: 5000 [IU] via SUBCUTANEOUS
  Filled 2019-03-08: qty 1

## 2019-03-08 MED ORDER — METOPROLOL TARTRATE 25 MG/10 ML ORAL SUSPENSION
50.0000 mg | Freq: Two times a day (BID) | ORAL | Status: DC
  Administered 2019-03-08: 50 mg
  Filled 2019-03-08: qty 20

## 2019-03-08 MED ORDER — MIDAZOLAM HCL 2 MG/2ML IJ SOLN
INTRAMUSCULAR | Status: AC
  Administered 2019-03-08: 2 mg via INTRAVENOUS
  Filled 2019-03-08: qty 2

## 2019-03-08 MED ORDER — HEPARIN (PORCINE) 25000 UT/250ML-% IV SOLN
1100.0000 [IU]/h | INTRAVENOUS | Status: DC
  Administered 2019-03-08: 1150 [IU]/h via INTRAVENOUS
  Administered 2019-03-09: 950 [IU]/h via INTRAVENOUS
  Administered 2019-03-10: 1000 [IU]/h via INTRAVENOUS
  Filled 2019-03-08 (×3): qty 250

## 2019-03-08 MED ORDER — METOPROLOL TARTRATE 5 MG/5ML IV SOLN
5.0000 mg | Freq: Four times a day (QID) | INTRAVENOUS | Status: DC | PRN
  Administered 2019-03-08 – 2019-03-11 (×5): 5 mg via INTRAVENOUS
  Filled 2019-03-08 (×6): qty 5

## 2019-03-08 MED ORDER — POTASSIUM PHOSPHATES 15 MMOLE/5ML IV SOLN
20.0000 mmol | Freq: Once | INTRAVENOUS | Status: AC
  Administered 2019-03-08: 20 mmol via INTRAVENOUS
  Filled 2019-03-08: qty 6.67

## 2019-03-08 MED ORDER — FUROSEMIDE 10 MG/ML IJ SOLN
40.0000 mg | Freq: Once | INTRAMUSCULAR | Status: AC
  Administered 2019-03-08: 40 mg via INTRAVENOUS
  Filled 2019-03-08: qty 4

## 2019-03-08 MED ORDER — MIDAZOLAM HCL 2 MG/2ML IJ SOLN
2.0000 mg | Freq: Once | INTRAMUSCULAR | Status: AC
  Administered 2019-03-08: 16:00:00 2 mg via INTRAVENOUS

## 2019-03-08 MED ORDER — DEXMEDETOMIDINE HCL IN NACL 400 MCG/100ML IV SOLN
0.4000 ug/kg/h | INTRAVENOUS | Status: DC
  Administered 2019-03-08 – 2019-03-09 (×2): 0.6 ug/kg/h via INTRAVENOUS
  Filled 2019-03-08 (×2): qty 100

## 2019-03-08 MED ORDER — MIDAZOLAM HCL 2 MG/2ML IJ SOLN
INTRAMUSCULAR | Status: AC
  Filled 2019-03-08: qty 2

## 2019-03-08 MED ORDER — FENTANYL CITRATE (PF) 100 MCG/2ML IJ SOLN
INTRAMUSCULAR | Status: AC
  Filled 2019-03-08: qty 2

## 2019-03-08 MED ORDER — METOPROLOL TARTRATE 5 MG/5ML IV SOLN
INTRAVENOUS | Status: AC
  Filled 2019-03-08: qty 5

## 2019-03-08 NOTE — Progress Notes (Signed)
   Discussed patient with Dr. Debara Pickett. Will plan for bedside TEE later this afternoon around 3:30-4:00pm. Discussed with Echo Tech who will be available at this time. Also discussed with RN Lauren who will notify critical care team so that they can be present to help with sedation.  Darreld Mclean, PA-C 03/08/2019 1:01 PM

## 2019-03-08 NOTE — Progress Notes (Signed)
ANTICOAGULATION CONSULT NOTE - Follow Up Consult  Pharmacy Consult for heparin (Warfarin on hold) Indication: atrial fibrillation  Allergies  Allergen Reactions  . Aspirin Hives and Swelling    Angioedema   . Rofecoxib Hives  . Hydrocodone Hives    Tolerates oxycodone and tramadol    Vital Signs: Temp: 98.2 F (36.8 C) (12/02 1600) Temp Source: Axillary (12/02 1600) BP: 142/84 (12/02 1600) Pulse Rate: 96 (12/02 1600)  Labs: Recent Labs    03/06/19 0352 03/07/19 0342 03/08/19 0511  HGB 7.4* 7.1* 7.2*  HCT 23.5* 22.8* 23.8*  PLT 176 162 184  LABPROT 18.1* 18.1* 18.1*  INR 1.5* 1.5* 1.5*  CREATININE  --  0.75 0.73    Estimated Creatinine Clearance: 71.6 mL/min (by C-G formula based on SCr of 0.73 mg/dL).  Assessment: 36 YOF on warfarin PTA for atrial fibrillation. Found to have ecoli bacteremia with likely endocarditis.   Hospital stay has been complicated by anemia and bleeding post op  Hep to start after TEE   Goal of Therapy:  Heparin level 0.3 - 0.5 units/ml Monitor platelets by anticoagulation protocol: Yes   Plan:  Heparin 1150 units/hr Initial hep lvl 0200 Daily hep lvl cbc  Watch for bleeding  Barth Kirks, PharmD, BCPS, BCCCP Clinical Pharmacist [phone]  Please check AMION for all [place] numbers  03/08/2019 5:50 PM

## 2019-03-08 NOTE — Progress Notes (Signed)
Dr. Lynetta Mare rounding at bedside. RN notified of HR converting to ST and SVT, but not sustaining. New orders placed for scheduled lopressor.

## 2019-03-08 NOTE — Progress Notes (Signed)
OK with ID to place PICC per secure chat.

## 2019-03-08 NOTE — Progress Notes (Addendum)
NAME:  Robin Snow, MRN:  [id number], DOB:  18-Feb-1941, LOS: 13 ADMISSION DATE:  02/22/2019, CONSULTATION DATE: 11/25 REFERRING MD: Dr. Margo AyeHall, CHIEF COMPLAINT: Postop respiratory failure, s/p BKA for septic knee prosthesis  Brief History   78 yo F with R knee replacement, s/p revision in 2018, presenting with prosthetic dislocation and abscess. S/p closed reduction, drainage 11/18. Treating for e.coli bacteremia. Underwent R AKA 11/25 and was intubated after procedure for respiratory failure.  Past Medical History  has Permanent atrial fibrillation (HCC); GERD (gastroesophageal reflux disease); Embolic stroke (HCC); Right hemiparesis (HCC); Chronic venous insufficiency; Essential hypertension; HLD (hyperlipidemia); Diabetes mellitus with complication (HCC); Anemia; CKD (chronic kidney disease) stage 3, GFR 30-59 ml/min; Chronic pain syndrome; Painful total knee replacement (HCC); Physical deconditioning; Sinus bradycardia; Orthostatic hypotension; Supratherapeutic INR; Chronic diastolic CHF (congestive heart failure) (HCC); Sinus pause: 4.02-4.80sec per telemetry 09/08/2016; Tachy-brady syndrome (HCC); Long term (current) use of anticoagulants; Obstructive sleep apnea; Urinary frequency; Pre-op evaluation; Warm antibody hemolytic anemia; Foot pain; Sepsis (HCC); Atrial fibrillation with RVR (HCC); Acute cystitis with hematuria; Closed dislocation of right knee; Effusion of right knee joint; AF (paroxysmal atrial fibrillation) (HCC); Bacteremia due to Escherichia coli; Iron deficiency anemia due to chronic blood loss; Dislocation of right knee; Osteomyelitis of right knee region [facility](HCC); Severe protein-calorie malnutrition (HCC); Abscess of thigh; Chronic osteomyelitis of right femur with draining sinus (HCC); Infection of total right knee replacement (HCC); Pressure injury of skin; and Acute respiratory failure with hypoxia (HCC) on their problem list.  Significant Hospital Events   11/18 admitted. R  knee closed reduction, drainage 11/21 ongoing tx for e coli bacteremia 11/23 PT rec dc to SNF when appropriate, family refuses. ID consulted for abx duration 11/24 ID recommend TEE, cardiology consulted. 11/25 R AKA for septic abscess and osteomyelitis . Reintubated following surgery.To ICU  11/26 hypotensive. Got transfused (total of 4 units); pressor dependent. Surgery noting she would still need hip disarticulation once stable 11/27: hgb down from 7.5 to 4.8 overnight. Still sig output from surgical site drain.3 units PRBC. 2 units FFP 11/29: Hemoglobin has been stable 12/1 Off pressors, weaning on PSV  Consults:  ID Ortho PCCM Cardiology  Procedures:  11/18 > closed reduction of R knee prosthetic, drainage 11/25 > R AKA 11/25 ETT >  Significant Diagnostic Tests:  11/18 R knee plain film > Large R knee effusion, anterior dislocation of femoral component to tibial component   11/27 CT abd/pelvis>  1. No evidence of retroperitoneal hemorrhage. 2. Mild colonic ileus.  No evidence of bowel obstruction. 3. Moderate diffuse body wall edema. 4. Moderate left renal parenchymal atrophy.  12/2 CXR > Stable cardiomegaly and mild edema. Stable bibasilar airspace disease likely reflects atelectasis. Infection is not excluded. Stable small bilateral pleural effusions.  Micro Data:  11/18 SARS Cov2 > neg  11/18 synovial fluid > Moderate E.Coli 11/18 BCx > e.coli 11/23 and 11/25 BCx > neg   Antimicrobials:  Rocephin 11/19 > (6 wk course)   Interim history/subjective:  No reports some dark liquid stool but not consistent with GI bleed, remains off pressors and is seen again on PSV and tolerating it well. She is alert able to follow all commands.   Objective   Blood pressure 124/68, pulse 87, temperature 97.6 F (36.4 C), temperature source Axillary, resp. rate 15, height 5\' 7"  (1.702 m), weight 106.4 kg, SpO2 100 %.    Vent Mode: PSV;CPAP FiO2 (%):  [30 %] 30 % Set Rate:  [15  bmp] 15  bmp Vt Set:  [370 mL] 370 mL PEEP:  [5 cmH20] 5 cmH20 Pressure Support:  [5 cmH20] 5 cmH20 Plateau Pressure:  [12 cmH20-14 cmH20] 14 cmH20   Intake/Output Summary (Last 24 hours) at 03/08/2019 0737 Last data filed at 03/08/2019 0600 Gross per 24 hour  Intake 1945.97 ml  Output 800 ml  Net 1145.97 ml   Filed Weights   03/01/19 1343 03/07/19 0500 03/08/19 0500  Weight: 107 kg 106.5 kg 106.4 kg    Examination: . General: Chronically ill appearing elderly female on mechanical ventilation, in NAD HEENT: ETT, MM pink/moist, PERRL,  Neuro: Alert and able to follow commands on vent, non focal  CV: s1s2 regular rate and rhythm, frequent PAC's, no murmur, rubs, or gallops,  PULM:  Clear to ascultation bilaterally, no added breath sounds,  GI: soft, bowel sounds active in all 4 quadrants, non-tender, non-distended, tolerating TF Extremities: warm/dry, no edema  Skin: no rashes or lesions  Assessment & Plan:   Postoperative respiratory failure  -In the setting of E-coli bacteremia, recent AKA, and significant deconditioning  P:  Continue ventilator support with lung protective strategies  Once decision on timing for TEE determined can likely be extubated  Wean PEEP and FiO2 for sats greater than 90%.  Continue daily weaning trials  Head of bed elevated 30 degrees.  Plateau pressures less than 30 cm H20.  Follow intermittent chest x-ray and ABG Ensure adequate pulmonary hygiene  Follow cultures  VAP bundle in place  PAD protocol    Severe acute blood loss anemia  -Stabilized post transfusion. CT neg for hematoma.  P:  HGB stable at 7.2 Follow transfusion protocol  Trend CBC    Circulatory shock  - Due to E.coli bacteremia 2/2 R knee abscess/osteo, s/p AKA.  P:  Stress dose steroids started 11/30  No longer requiring pressor support as of 12/1  Consider discontinuing stress dose steroids  Continue IV antibiotics per ID recommendations  Coordinate with cardiology  to arrange for TEE (orignially deferred due to AKA and acute blood lose anemia)  Per ortho patient will likely need hip disarticulation in the near future   E. coli bacteremia  P:  ID following, appreciate assistance  6 weeks for IV antibiotics per ID  Repeat blood cultures negative to date    Chronic kidney disease stage III  -[place] creatinine between 0.9-1.1  P:  Follow renal function / urine output  Trend Bmet  Avoid nephrotoxins  Strict I&O    Hypophosphatemia  P:  Trend Phos  Replete Kphos as needed    Atrial fibrillation  Sick sinus syndrome s/p pacemaker  -Baseline anticoagulated with coumadin  P:  Hold home anticoagulation due excessive bleeding postop  Continuous telemetry  INR currently 1.5, coordinate with ortho and cardiology prior to resumption of anticoagulation   Protein calorie malnutrition  P:  Dietitian following  Supplementation per OG  Tube feeds   Sever deconditioning / Debility  -Family declined skilled nursing facility  P:  PT/OT when stable to do so   Best practice:  Diet: Tube Feeds Pain/Anxiety/Delirium protocol (if indicated): Precedex, fentanyl VAP protocol (if indicated): In place DVT prophylaxis: SCD GI prophylaxis: Protonix Glucose control: SSI Mobility: Bedrest Code Status: Full code Family Communication: Daughter Janett Billow called and updated regarding progress 12/2 Disposition: ICU    Critical care:   Performed by: Johnsie Cancel   Total critical care time: 40 minutes  Critical care time was exclusive of separately billable procedures and treating other patients.  Critical care was necessary to treat or prevent imminent or life-threatening deterioration.  Critical care was time spent personally by me on the following activities: development of treatment plan with patient and/or surrogate as well as nursing, discussions with consultants, evaluation of patient's response to treatment, examination of patient,  obtaining history from patient or surrogate, ordering and performing treatments and interventions, ordering and review of laboratory studies, ordering and review of radiographic studies, pulse oximetry and re-evaluation of patient's condition.  Signature:   Delfin Gant, NP-C [place] [facility] After hours pager: [phone]. 03/08/2019, 7:48 AM

## 2019-03-08 NOTE — H&P (Signed)
   INTERVAL PROCEDURE H&P  History and Physical Interval Note:  03/08/2019 4:14 PM  Robin Snow has presented today for their planned procedure. The various methods of treatment have been discussed with the patient and family. After consideration of risks, benefits and other options for treatment, the patient has consented to the procedure.  The patients' outpatient history has been reviewed, patient examined, and no change in status from most recent office note within the past 30 days. I have reviewed the patients' chart and labs and will proceed as planned. Questions were answered to the patient's satisfaction.   Pixie Casino, MD, [facility], [place] Director of the [facility] Lipid [facility] &  Cardiovascular Risk Reduction Clinic Diplomate of the American Board of Clinical Lipidology Attending Cardiologist  Direct Dial: [phone]  Fax: [phone]  Website:  [email or web address] 03/08/2019, 4:14 PM

## 2019-03-08 NOTE — Progress Notes (Signed)
[place] for Infectious Disease  Date of Admission:  02/22/2019      Total days of antibiotics 15  Day 15 Ceftriaxone           ASSESSMENT: Robin Snow is a 78 y.o. female with recurrent Ecoli bacteremia with large abscess compromising right total knee prosthesis with extension up to her hip now s/p AKA on 11/25. This has been complicated by excessive bleeding/hematoma in the setting of chronic anticoagulation with warfarin. [name] is considering a right hip disarticulation for further control over infection but no plans currently. Should she not require further surgery, decline or not be a candidate for any further intervention would anticipate a long course to treat for deep infection/septic arthritis/osteomyelitis.   TEE to be arranged to evaluate her ICD leads to ensure shorter course of antibiotics is safe. PCCM has called cardiology.   Respiratory failure following surgery - PCCM following. Weaning again this morning but more uncomfortable appearing today with episodes of SVT/rapid AFib noted on telemetry today.   ID will continue to follow peripherally for surgical plans and TEE results to further define length of therapy.    PLAN: 1. Continue Ceftriaxone IV with duration pending further surgical decisions and #2.  2. Follow for timing of TEE to evaluate PPM leads    Principal Problem:   Bacteremia due to Escherichia coli Active Problems:   Effusion of right knee joint   Diabetes mellitus with complication (HCC)   Anemia   Supratherapeutic INR   Sinus pause: 4.02-4.80sec per telemetry 09/08/2016   Sepsis ([place])   Closed dislocation of right knee   AF (paroxysmal atrial fibrillation) (HCC)   Iron deficiency anemia due to chronic blood loss   Dislocation of right knee   Osteomyelitis of right knee region ([place])   Severe protein-calorie malnutrition (HCC)   Abscess of thigh   Chronic osteomyelitis of right femur with draining sinus ([place])  Infection of total right knee replacement ([place])   Pressure injury of skin   Acute respiratory failure ([place])   . chlorhexidine gluconate (MEDLINE KIT)  15 mL Mouth Rinse BID  . Chlorhexidine Gluconate Cloth  6 each Topical Daily  . cholecalciferol  2,000 Units Per Tube BID  . feeding supplement (PRO-STAT SUGAR FREE 64)  60 mL Per Tube BID  . ferrous sulfate  300 mg Per Tube BID WC  . gabapentin  300 mg Per Tube TID  . heparin  5,000 Units Subcutaneous Q8H  . insulin aspart  0-15 Units Subcutaneous Q4H  . mouth rinse  15 mL Mouth Rinse 10 times per day  . metoprolol tartrate  50 mg Per Tube BID  . pantoprazole sodium  40 mg Per Tube BID  . pravastatin  20 mg Per Tube q1800  . senna-docusate  2 tablet Per Tube BID  . sodium chloride flush  3 mL Intravenous Q12H  . Vitamin D (Ergocalciferol)  50,000 Units Per Tube Q7 days    SUBJECTIVE: Remains intubated. Tearful and coughing on ventilator.   Afebrile overnight with normalization of WBC count.  FiO2 stable 30%, weaning daily but requires full support before PM.    Review of Systems: Review of Systems  Unable to perform ROS: Intubated    Allergies  Allergen Reactions  . Aspirin Hives and Swelling    Angioedema   . Rofecoxib Hives  . Hydrocodone Hives    Tolerates oxycodone and tramadol    OBJECTIVE: Vitals:  03/08/19 0600 03/08/19 0800 03/08/19 0900 03/08/19 1000  BP: 124/68 (!) 139/91 (!) 141/95 131/85  Pulse: 87 (!) 114 (!) 144 (!) 117  Resp: _0 Temp:  98 F (36.7 C)    TempSrc:  Axillary    SpO2: 100% 100% 100% 100%  Weight:      Height:       Body mass index is 36.74 kg/m.  Physical Exam Vitals signs and nursing note reviewed.  Constitutional:      Appearance: She is well-developed.     Comments: Appears uncomfortable on ventilator, coughing.   HENT:     Mouth/Throat:     Mouth: No oral lesions.     Dentition: Normal dentition. No dental abscesses.     Pharynx: No oropharyngeal exudate.   Cardiovascular:     Rate and Rhythm: Regular rhythm. Tachycardia present.     Heart sounds: Normal heart sounds. No murmur.     Comments: Moments of rapid AF/SVT on telemetry up to 180 at times Pulmonary:     Effort: Pulmonary effort is normal.     Breath sounds: Normal breath sounds.     Comments: 30% FiO2 and weaning currently.  Abdominal:     General: There is no distension.     Palpations: Abdomen is soft.     Tenderness: There is no abdominal tenderness.  Musculoskeletal:     Comments: R AKA vac dressing dry/clean without any surrounding areas of erythema. Generalized pitting edema noted throughout b/l hips and thighs.   Lymphadenopathy:     Cervical: No cervical adenopathy.  Skin:    General: Skin is warm and dry.     Capillary Refill: Capillary refill takes less than 2 seconds.     Findings: No rash.  Neurological:     Mental Status: She is alert.     Comments: Alert. Following commands and nods to questions appropriately.      Lab Results Lab Results  Component Value Date   WBC 8.3 03/08/2019   HGB 7.2 (L) 03/08/2019   HCT 23.8 (L) 03/08/2019   MCV 97.9 03/08/2019   PLT 184 03/08/2019    Lab Results  Component Value Date   CREATININE 0.73 03/08/2019   BUN 32 (H) 03/08/2019   NA 146 (H) 03/08/2019   K 3.8 03/08/2019   CL 110 03/08/2019   CO2 29 03/08/2019    Lab Results  Component Value Date   ALT 13 02/04/2019   AST 15 02/04/2019   ALKPHOS 79 02/04/2019   BILITOT 0.9 02/04/2019     Microbiology: No results found for this or any previous visit (from the past 240 hour(s)).  Janene Madeira, MSN, NP-C [facility] for Infectious Disease [email or web address] Pager: [phone] Office: [phone] [place]: [phone]

## 2019-03-08 NOTE — Progress Notes (Signed)
S/P Right AKA. Intubated but awake and aware.  Right vac dressing in place. No drainage in VAC.Vitals signs reviewed .

## 2019-03-08 NOTE — CV Procedure (Signed)
TRANSESOPHAGEAL ECHOCARDIOGRAM (TEE) NOTE  INDICATIONS: infective endocarditis  PROCEDURE:   Informed consent was obtained prior to the procedure. The risks, benefits and alternatives for the procedure were discussed and the patient comprehended these risks.  Risks include, but are not limited to, cough, sore throat, vomiting, nausea, somnolence, esophageal and stomach trauma or perforation, bleeding, low blood pressure, aspiration, pneumonia, infection, trauma to the teeth and death.    After a procedural time-out, the patient was given 4 mg versed and 50 mcg fentanyl for moderate sedation.  The patient's heart rate, blood pressure, and oxygen saturation are monitored continuously during the procedure. The transesophageal probe was inserted in the esophagus and stomach without difficulty and multiple views were obtained.  The patient was kept under observation until the patient left the procedure room.  I was present face-to-face 100% of this time. The patient left the procedure room in stable condition.   Agitated microbubble saline contrast was not administered.  COMPLICATIONS:    There were no immediate complications.  Findings:  1. LEFT VENTRICLE: The left ventricular wall thickness is mildly increased.  The left ventricular cavity is normal in size. Wall motion is normal.  LVEF is 55-60%.  2. RIGHT VENTRICLE:  The right ventricle is normal in structure and function without any thrombus or masses.    3. LEFT ATRIUM:  The left atrium is severely dilated in size without any thrombus or masses.  There is spontaneous echo contrast ("smoke") in the left atrium consistent with a low flow state.  4. LEFT ATRIAL APPENDAGE:  The small left atrial appendage is free of any thrombus or masses. The appendage has single lobes. Pulse doppler indicates low flow in the appendage.  5. ATRIAL SEPTUM:  The atrial septum appears intact and is free of thrombus and/or masses.  There is no evidence for  interatrial shunting by color doppler and saline microbubble.  6. RIGHT ATRIUM:  The right atrium is moderately dilated in size and function without any thrombus or masses. Pacer wires are noted and are without attached thrombus or vegetation.  7. MITRAL VALVE:  The mitral valve is normal in structure and function with Mild regurgitation.  There were no vegetations or stenosis.  8. AORTIC VALVE:  The aortic valve is trileaflet, however, the leaflets are thickened with a small mobile mass noted on the left coronary cusp on the aortic side of the valve. There is trivial regurgitation.  There was no stenosis. Visualized with 2D/3D echo.  9. TRICUSPID VALVE:  The tricuspid valve is normal in structure and function with Mild regurgitation.  There were no vegetations or stenosis  10.  PULMONIC VALVE:  The pulmonic valve is normal in structure and function with trivial regurgitation.  There were no vegetations or stenosis.   11. AORTIC ARCH, ASCENDING AND DESCENDING AORTA:  There was grade 1 Myrtis Ser et. Al, 1992) atherosclerosis of the ascending aorta, aortic arch, or proximal descending aorta.  12. PULMONARY VEINS: Anomalous pulmonary venous return was not noted.  13. PERICARDIUM: The pericardium appeared normal and non-thickened.  There is no pericardial effusion.  IMPRESSION:   1. Small mobile mass and thickening of the aortic valve, suspicious for endocarditis. 2. Pacemaker/ICD leads noted without obvious vegetation. 3. Severe LAE, moderate RAE 4. Spontaneous echo contrast in the left atrium suggestive of low flow 5. Mild MR, mild TR, trivial AI, trivial PI 6. LVEF 55-60%  RECOMMENDATIONS:    1. Findings suggestive of aortic valve endocarditis. Given the fact that she  has pacemaker/ICD leads in place, would treat with extended course of antibiotics per ID. Recommend discussion with cardiac EP to evaluate if ICD removal is recommended.  Time Spent Directly with the Patient:  45 minutes    Pixie Casino, MD, [facility], [place] Director of the [facility] Lipid Disorders &  Cardiovascular [facility] Reduction Clinic Diplomate of the American Board of Clinical Lipidology Attending Cardiologist  Direct Dial: [phone]  Fax: [phone]  Website:  [email or web address] 03/08/2019, 4:46 PM

## 2019-03-08 NOTE — Progress Notes (Addendum)
  Echocardiogram Echocardiogram transesophagael has been performed.  Robin Snow 03/08/2019, 4:43 PM

## 2019-03-08 NOTE — Progress Notes (Signed)
Paged Whitney NP regarding tachycardia in 150's that is now sustaining. New order placed for 5 mg of lopressor PRN. Also have new orders for precedex gtt to maintain RASS goal.

## 2019-03-09 ENCOUNTER — Inpatient Hospital Stay (HOSPITAL_COMMUNITY): Payer: Medicare HMO

## 2019-03-09 DIAGNOSIS — J95821 Acute postprocedural respiratory failure: Secondary | ICD-10-CM | POA: Diagnosis not present

## 2019-03-09 DIAGNOSIS — B9620 Unspecified Escherichia coli [E. coli] as the cause of diseases classified elsewhere: Secondary | ICD-10-CM | POA: Diagnosis not present

## 2019-03-09 DIAGNOSIS — I330 Acute and subacute infective endocarditis: Secondary | ICD-10-CM | POA: Diagnosis not present

## 2019-03-09 DIAGNOSIS — R7881 Bacteremia: Secondary | ICD-10-CM | POA: Diagnosis not present

## 2019-03-09 LAB — BASIC METABOLIC PANEL
Anion gap: 11 (ref 5–15)
BUN: 30 mg/dL — ABNORMAL HIGH (ref 8–23)
CO2: 30 mmol/L (ref 22–32)
Calcium: 8.1 mg/dL — ABNORMAL LOW (ref 8.9–10.3)
Chloride: 107 mmol/L (ref 98–111)
Creatinine, Ser: 0.86 mg/dL (ref 0.44–1.00)
GFR calc Af Amer: >60 mL/min (ref >60)
GFR calc non Af Amer: >60 mL/min (ref >60)
Glucose, Bld: 112 mg/dL — ABNORMAL HIGH (ref 70–99)
Potassium: 3.2 mmol/L — ABNORMAL LOW (ref 3.5–5.1)
Sodium: 148 mmol/L — ABNORMAL HIGH (ref 135–145)

## 2019-03-09 LAB — CBC WITH DIFFERENTIAL/PLATELET
Abs Immature Granulocytes: 0.13 10*3/uL — ABNORMAL HIGH (ref 0.00–0.07)
Basophils Absolute: 0 10*3/uL (ref 0.0–0.1)
Basophils Relative: 0 %
Eosinophils Absolute: 0 10*3/uL (ref 0.0–0.5)
Eosinophils Relative: 0 %
HCT: 24.5 % — ABNORMAL LOW (ref 36.0–46.0)
Hemoglobin: 7.5 g/dL — ABNORMAL LOW (ref 12.0–15.0)
Immature Granulocytes: 2 %
Lymphocytes Relative: 25 %
Lymphs Abs: 1.5 10*3/uL (ref 0.7–4.0)
MCH: 29.6 pg (ref 26.0–34.0)
MCHC: 30.6 g/dL (ref 30.0–36.0)
MCV: 96.8 fL (ref 80.0–100.0)
Monocytes Absolute: 0.6 10*3/uL (ref 0.1–1.0)
Monocytes Relative: 11 %
Neutro Abs: 3.5 10*3/uL (ref 1.7–7.7)
Neutrophils Relative %: 62 %
Platelets: 195 10*3/uL (ref 150–400)
RBC: 2.53 MIL/uL — ABNORMAL LOW (ref 3.87–5.11)
RDW: 17.1 % — ABNORMAL HIGH (ref 11.5–15.5)
WBC: 5.7 10*3/uL (ref 4.0–10.5)
nRBC: 0.4 % — ABNORMAL HIGH (ref 0.0–0.2)

## 2019-03-09 LAB — GLUCOSE, CAPILLARY
Glucose-Capillary: 99 mg/dL (ref 70–99)
Glucose-Capillary: 98 mg/dL (ref 70–99)
Glucose-Capillary: 90 mg/dL (ref 70–99)
Glucose-Capillary: 87 mg/dL (ref 70–99)
Glucose-Capillary: 80 mg/dL (ref 70–99)
Glucose-Capillary: 84 mg/dL (ref 70–99)

## 2019-03-09 LAB — PHOSPHORUS: Phosphorus: 2.8 mg/dL (ref 2.5–4.6)

## 2019-03-09 LAB — HEPARIN LEVEL (UNFRACTIONATED)
Heparin Unfractionated: 0.61 IU/mL (ref 0.30–0.70)
Heparin Unfractionated: 0.51 IU/mL (ref 0.30–0.70)

## 2019-03-09 MED ORDER — VITAMIN D (ERGOCALCIFEROL) 1.25 MG (50000 UNIT) PO CAPS
50000.0000 [IU] | ORAL_CAPSULE | ORAL | Status: DC
  Administered 2019-03-16 – 2019-03-23 (×2): 50000 [IU] via ORAL
  Filled 2019-03-09 (×2): qty 1

## 2019-03-09 MED ORDER — METOPROLOL TARTRATE 50 MG PO TABS
50.0000 mg | ORAL_TABLET | Freq: Two times a day (BID) | ORAL | Status: DC
  Administered 2019-03-09 – 2019-03-29 (×36): 50 mg via ORAL
  Filled 2019-03-09 (×38): qty 1

## 2019-03-09 MED ORDER — SODIUM CHLORIDE 0.9% FLUSH
10.0000 mL | Freq: Two times a day (BID) | INTRAVENOUS | Status: DC
  Administered 2019-03-09 – 2019-03-10 (×2): 10 mL
  Administered 2019-03-10: 20 mL
  Administered 2019-03-11 – 2019-03-12 (×3): 10 mL

## 2019-03-09 MED ORDER — CHLORHEXIDINE GLUCONATE 0.12 % MT SOLN
15.0000 mL | Freq: Two times a day (BID) | OROMUCOSAL | Status: DC
  Administered 2019-03-09 – 2019-03-28 (×29): 15 mL via OROMUCOSAL
  Filled 2019-03-09 (×36): qty 15

## 2019-03-09 MED ORDER — SENNOSIDES-DOCUSATE SODIUM 8.6-50 MG PO TABS
2.0000 | ORAL_TABLET | Freq: Two times a day (BID) | ORAL | Status: DC
  Administered 2019-03-13 – 2019-03-29 (×24): 2 via ORAL
  Filled 2019-03-09 (×27): qty 2

## 2019-03-09 MED ORDER — DEXTROSE 5 % IV SOLN
INTRAVENOUS | Status: DC
  Administered 2019-03-09: 14:00:00 via INTRAVENOUS

## 2019-03-09 MED ORDER — PANTOPRAZOLE SODIUM 40 MG PO TBEC
40.0000 mg | DELAYED_RELEASE_TABLET | Freq: Every day | ORAL | Status: DC
  Administered 2019-03-09 – 2019-03-29 (×21): 40 mg via ORAL
  Filled 2019-03-09 (×20): qty 1

## 2019-03-09 MED ORDER — ORAL CARE MOUTH RINSE
15.0000 mL | Freq: Two times a day (BID) | OROMUCOSAL | Status: DC
  Administered 2019-03-10 – 2019-03-29 (×32): 15 mL via OROMUCOSAL

## 2019-03-09 MED ORDER — SODIUM CHLORIDE 0.9% FLUSH
10.0000 mL | INTRAVENOUS | Status: DC | PRN
  Administered 2019-03-11: 10 mL
  Filled 2019-03-09: qty 40

## 2019-03-09 MED ORDER — PRAVASTATIN SODIUM 10 MG PO TABS
20.0000 mg | ORAL_TABLET | Freq: Every day | ORAL | Status: DC
  Administered 2019-03-10 – 2019-03-28 (×18): 20 mg via ORAL
  Filled 2019-03-09 (×19): qty 2

## 2019-03-09 MED ORDER — GABAPENTIN 300 MG PO CAPS
300.0000 mg | ORAL_CAPSULE | Freq: Three times a day (TID) | ORAL | Status: DC
  Administered 2019-03-09 – 2019-03-10 (×2): 300 mg via ORAL
  Filled 2019-03-09 (×2): qty 1

## 2019-03-09 MED ORDER — POTASSIUM CHLORIDE 10 MEQ/100ML IV SOLN
10.0000 meq | INTRAVENOUS | Status: AC
  Administered 2019-03-09 (×4): 10 meq via INTRAVENOUS
  Filled 2019-03-09 (×4): qty 100

## 2019-03-09 MED ORDER — VITAMIN D 25 MCG (1000 UNIT) PO TABS
2000.0000 [IU] | ORAL_TABLET | Freq: Two times a day (BID) | ORAL | Status: DC
  Administered 2019-03-09 – 2019-03-29 (×40): 2000 [IU] via ORAL
  Filled 2019-03-09 (×41): qty 2

## 2019-03-09 MED ORDER — FERROUS SULFATE 300 (60 FE) MG/5ML PO SYRP
300.0000 mg | ORAL_SOLUTION | Freq: Two times a day (BID) | ORAL | Status: DC
  Administered 2019-03-11 – 2019-03-29 (×35): 300 mg via ORAL
  Filled 2019-03-09 (×41): qty 5

## 2019-03-09 NOTE — Progress Notes (Signed)
Attempted to phone daughter and adult son following extubation, both phones went to VM with no indentification. Did not leave message.

## 2019-03-09 NOTE — Progress Notes (Addendum)
Nutrition Follow-up  DOCUMENTATION CODES:   Obesity unspecified  INTERVENTION:   Ensure Enlive po BID, each supplement provides 350 kcal and 20 grams of protein - chocolate or strawberry flavor  MVI daily   Discussed importance of adequate nutrition with pt and daughter to support wound healing.    NUTRITION DIAGNOSIS:   Increased nutrient needs related to wound healing as evidenced by estimated needs. Ongoing.   GOAL:   Provide needs based on ASPEN/SCCM guidelines Progressing   MONITOR:   Vent status, Weight trends, Labs, TF tolerance, Skin, I & O's  REASON FOR ASSESSMENT:   Consult, Ventilator Enteral/tube feeding initiation and management  ASSESSMENT:   Pt with PMH of DM, HTN, morbid obesity, and GER who had R knee replacement s/p revision in 2018 now admitted with prosthetic dislocation and abscess s/p closed reduction and drainage 11/18 treating for e.coli bacteremia now s/p R AKA 11/25 with wound VAC.    Pt discussed during ICU rounds and with RN.  Per RN pt doing well after extubation and has a diet ordered for lunch. Spoke with pt and daughter. Pt is hungry and is ready to eat lunch. She is agreeable to ensure (prefers chocolate and strawberry).   11/28 trickle TF started 12/2 TEE 12/3 extubated 12/4 diet advanced  Medications reviewed and include: vitamin D, ferrous sulfate, senokot  Precedex Labs reviewed: PO4: 3.3 (L) Moderate edema noted  VAC: 0   Diet Order:   Diet Order            DIET DYS 3 Room service appropriate? Yes with Assist; Fluid consistency: Thin  Diet effective now              EDUCATION NEEDS:   No education needs have been identified at this time  Skin:  Skin Assessment: Skin Integrity Issues: Skin Integrity Issues:: Stage II, Incisions Stage II: buttocks, R thigh Incisions: R AKA with VAC  Last BM:  12/2  Height:   Ht Readings from Last 1 Encounters:  03/01/19 5\' 7"  (1.702 m)    Weight:   Wt Readings from  Last 1 Encounters:  03/10/19 97.8 kg    Ideal Body Weight:  56.3 kg(adjusted for R AKA)  BMI:  Body mass index is 33.77 kg/m.  Estimated Nutritional Needs:   Kcal:  1900-2200  Protein:  115-130 grams  Fluid:  > 1.9 L/day   Maylon Peppers RD, LDN, CNSC [phone] Pager [phone] After Hours Pager

## 2019-03-09 NOTE — Progress Notes (Signed)
ANTICOAGULATION CONSULT NOTE - Follow Up Consult  Pharmacy Consult for Heparin (Warfarin on hold) Indication: atrial fibrillation  Allergies  Allergen Reactions  . Aspirin Hives and Swelling    Angioedema   . Rofecoxib Hives  . Hydrocodone Hives    Tolerates oxycodone and tramadol    Vital Signs: Temp: 97.8 F (36.6 C) (12/03 0400) Temp Source: Axillary (12/03 0400) BP: 148/87 (12/03 0400) Pulse Rate: 56 (12/03 0400)  Labs: Recent Labs    03/07/19 0342 03/08/19 0511 03/09/19 0200 03/09/19 0500  HGB 7.1* 7.2*  --  7.5*  HCT 22.8* 23.8*  --  24.5*  PLT 162 184  --  195  LABPROT 18.1* 18.1*  --   --   INR 1.5* 1.5*  --   --   HEPARINUNFRC  --   --  0.61  --   CREATININE 0.75 0.73  --  0.86    Estimated Creatinine Clearance: 63.5 mL/min (by C-G formula based on SCr of 0.86 mg/dL).  Assessment: 7 YOF on warfarin PTA for atrial fibrillation. Found to have ecoli bacteremia with likely endocarditis.   Hospital stay has been complicated by anemia and bleeding post op  Hep to start after TEE   12/3 AM update:  Heparin level just above low goal   Goal of Therapy:  Heparin level 0.3 - 0.5 units/ml Monitor platelets by anticoagulation protocol: Yes   Plan:  Dec heparin to 1000 units/hr 1400 heparin level   Narda Bonds, PharmD, BCPS Clinical Pharmacist Phone: [phone]

## 2019-03-09 NOTE — Progress Notes (Signed)
Chaplain visited after hearing the nurse discuss during rounding that the patient desired to complete a Health care POA.  The nurse stated that the family was seeking to gain decision making control over the patient but when the chaplain visited the patient appeared to be able to make their own decisions.  The chaplain provided the AD paperwork and was available to answer any questions regarding the AD.  The chaplain will follow-up if needed for the completion of the AD.  Brion Aliment Chaplain Resident For questions concerning this note please contact me by pager [phone]

## 2019-03-09 NOTE — Progress Notes (Signed)
ANTICOAGULATION CONSULT NOTE  Pharmacy Consult:  Heparin Indication: atrial fibrillation  Allergies  Allergen Reactions  . Aspirin Hives and Swelling    Angioedema   . Rofecoxib Hives  . Hydrocodone Hives    Tolerates oxycodone and tramadol    Vital Signs: Temp: 97 F (36.1 C) (12/03 0725) Temp Source: Axillary (12/03 0725) BP: 125/62 (12/03 1400) Pulse Rate: 85 (12/03 1400)  Heparin dosing weight = 83 kg  Labs: Recent Labs    03/07/19 0342 03/08/19 0511 03/09/19 0200 03/09/19 0500 03/09/19 1400  HGB 7.1* 7.2*  --  7.5*  --   HCT 22.8* 23.8*  --  24.5*  --   PLT 162 184  --  195  --   LABPROT 18.1* 18.1*  --   --   --   INR 1.5* 1.5*  --   --   --   HEPARINUNFRC  --   --  0.61  --  0.51  CREATININE 0.75 0.73  --  0.86  --     Estimated Creatinine Clearance: 63.5 mL/min (by C-G formula based on SCr of 0.86 mg/dL).  Assessment: 43 YOF presented with right knee pain and swelling along boils/blisters on skin.  Found to have E.coli bacteremia with AV endocarditis and abscess extending to hip that will eventually need hip disarticulation.  Hospital stay has been complicated by anemia and bleeding s/p recent AKA.  Patient has a history of Afib on Coumadin PTA and Pharmacy consulted to dose IV heparin while Coumadin remains on hold.  Heparin level is slightly supra-therapeutic; no bleeding reported.  Goal of Therapy:  Heparin level 0.3 - 0.5 units/ml Monitor platelets by anticoagulation protocol: Yes   Plan:  Reduce heparin gtt to 950 units/hr F/U AM labs Daily heparin level and CBC   Sola Margolis D. Mina Marble, PharmD, BCPS, [place] 03/09/2019, 2:52 PM

## 2019-03-09 NOTE — Progress Notes (Signed)
NAME:  Robin Snow, MRN:  [id number], DOB:  10-29-1940, LOS: 14 ADMISSION DATE:  02/22/2019, CONSULTATION DATE: 11/25 REFERRING MD: Dr. Margo Aye, CHIEF COMPLAINT: Postop respiratory failure, s/p BKA for septic knee prosthesis  Brief History   78 yo F with R knee replacement, s/p revision in 2018, presenting with prosthetic dislocation and abscess. S/p closed reduction, drainage 11/18. Treating for e.coli bacteremia. Underwent R AKA 11/25 and was intubated after procedure for respiratory failure.  Past Medical History  has Permanent atrial fibrillation (HCC); GERD (gastroesophageal reflux disease); Embolic stroke (HCC); Right hemiparesis (HCC); Chronic venous insufficiency; Essential hypertension; HLD (hyperlipidemia); Diabetes mellitus with complication (HCC); Anemia; CKD (chronic kidney disease) stage 3, GFR 30-59 ml/min; Chronic pain syndrome; Painful total knee replacement (HCC); Physical deconditioning; Sinus bradycardia; Orthostatic hypotension; Supratherapeutic INR; Chronic diastolic CHF (congestive heart failure) (HCC); Sinus pause: 4.02-4.80sec per telemetry 09/08/2016; Tachy-brady syndrome (HCC); Long term (current) use of anticoagulants; Obstructive sleep apnea; Urinary frequency; Pre-op evaluation; Warm antibody hemolytic anemia; Foot pain; Sepsis (HCC); Atrial fibrillation with RVR (HCC); Acute cystitis with hematuria; Closed dislocation of right knee; Effusion of right knee joint; AF (paroxysmal atrial fibrillation) (HCC); Bacteremia due to Escherichia coli; Iron deficiency anemia due to chronic blood loss; Dislocation of right knee; Osteomyelitis of right knee region [facility]); Severe protein-calorie malnutrition (HCC); Abscess of thigh; Chronic osteomyelitis of right femur with draining sinus (HCC); Infection of total right knee replacement (HCC); Pressure injury of skin; and Acute respiratory failure (HCC) on their problem list.  Significant Hospital Events   11/18 admitted. R knee closed  reduction, drainage 11/21 ongoing tx for e coli bacteremia 11/23 PT rec dc to SNF when appropriate, family refuses. ID consulted for abx duration 11/24 ID recommend TEE, cardiology consulted. 11/25 R AKA for septic abscess and osteomyelitis . Reintubated following surgery.To ICU  11/26 hypotensive. Got transfused (total of 4 units); pressor dependent. Surgery noting she would still need hip disarticulation once stable 11/27: hgb down from 7.5 to 4.8 overnight. Still sig output from surgical site drain.3 units PRBC. 2 units FFP 11/29: Hemoglobin has been stable 12/1 Off pressors, weaning on PSV  Consults:  ID Ortho PCCM Cardiology  Procedures:  11/18 > closed reduction of R knee prosthetic, drainage 11/25 > R AKA 11/25 ETT >  Significant Diagnostic Tests:  11/18 R knee plain film > Large R knee effusion, anterior dislocation of femoral component to tibial component   11/27 CT abd/pelvis>  1. No evidence of retroperitoneal hemorrhage. 2. Mild colonic ileus.  No evidence of bowel obstruction. 3. Moderate diffuse body wall edema. 4. Moderate left renal parenchymal atrophy.  12/2 CXR > Stable cardiomegaly and mild edema. Stable bibasilar airspace disease likely reflects atelectasis. Infection is not excluded. Stable small bilateral pleural effusions.  Micro Data:  11/18 SARS Cov2 > neg  11/18 synovial fluid > Moderate E.Coli 11/18 BCx > e.coli 11/23 and 11/25 BCx > neg   Antimicrobials:  Rocephin 11/19 > (6 wk course)   Interim history/subjective:  Slightly more lethargic today, currently on Precedex drip. Will arouse to verbal stimuli.   Objective   Blood pressure 136/76, pulse 80, temperature (!) 97 F (36.1 C), temperature source Axillary, resp. rate (!) 21, height 5\' 7"  (1.702 m), weight 97.1 kg, SpO2 100 %.    Vent Mode: PRVC FiO2 (%):  [30 %] (P) 30 % Set Rate:  [15 bmp] 15 bmp Vt Set:  [370 mL] 370 mL PEEP:  [5 cmH20] 5 cmH20 Pressure Support:  [5 cmH20] 5  cmH20 Plateau Pressure:  [14 cmH20-16 cmH20] 15 cmH20   Intake/Output Summary (Last 24 hours) at 03/09/2019 1610 Last data filed at 03/09/2019 0800 Gross per 24 hour  Intake 1243.7 ml  Output 1850 ml  Net -606.3 ml   Filed Weights   03/07/19 0500 03/08/19 0500 03/09/19 0500  Weight: 106.5 kg 106.4 kg 97.1 kg    Examination: . General: Chronically ill appearing elderly female on mechanical ventilation, in NAD HEENT: ETT, MM pink/moist, PERRL,  Neuro: Lethargic but able to follow commands, non-focal CV: s1s2 regular rate and rhythm, no murmur, rubs, or gallops,  PULM:  Clear to ascultation bilaterally GI: soft, bowel sounds active in all 4 quadrants, non-tender, non-distended, tolerating TF Extremities: warm/dry, no edema  Skin: no rashes or lesions  Assessment & Plan:   Postoperative respiratory failure  -In the setting of E-coli bacteremia, recent AKA, and significant deconditioning  P:  SBT today and once mentation improves patient can likely be extubated  Head of bed elevated 30 degrees. Plateau pressures less than 30 cm H20.  Follow intermittent chest x-ray and ABG.   Ensure adequate pulmonary hygiene  Follow cultures  VAP bundle in place  PAD protocol  Endocarditis E.Colie bacteriemia  -TEE completed 12/2 with evidence of  Mobile mass and thickening of aortic valve suspicious for aortic valvular vegetation  P: Consult cardiac EP for evaluation of ICD removal Continue IV antibiotics  ID following, appreciate assistance Repeat culture negative    Severe acute blood loss anemia  -Stabilized post transfusion. CT neg for hematoma.  P:  HBG stable at 7.5 Follow transfusion protocol Trend CBC  Circulatory shock  - Due to E.coli bacteremia 2/2 R knee abscess/osteo, s/p AKA.  P:  Stress dose steroids stopped 12/2 Pressors stopped 12/1 Continue IV antibiotics per ID recommendations TEE with evidence or aortic vegetation, see below   Patient will need hip  disarticulation in the future    Chronic kidney disease stage III  -[place] creatinine between 0.9-1.1  P:  Follow renal function / urine outpur Trend BMET Avoid nephrotoxins Strict I&O   Hypophosphatemia  P:  Trend phos Replete as needed    Atrial fibrillation  Sick sinus syndrome s/p pacemaker  -Baseline anticoagulated with coumadin  P:  Low dose heparin drip started 12/2 No signs of bleeding post anticoagulation  Follow INR Continuous telemetry    Protein calorie malnutrition  P:  Dietitian following Nutritional supplementation  Tube feeds  Sever deconditioning / Debility  -Family declined skilled nursing facility  P:  PT/OT when medically stable    Best practice:  Diet: Tube Feeds Pain/Anxiety/Delirium protocol (if indicated): Precedex, fentanyl VAP protocol (if indicated): In place DVT prophylaxis: SCD GI prophylaxis: Protonix Glucose control: SSI Mobility: Bedrest Code Status: Full code Family Communication: Daughter [name] called and updated regarding progress 12/2 Disposition: ICU    Critical care:   Performed by: Delfin Gant   Total critical care time: 34 minutes  Critical care time was exclusive of separately billable procedures and treating other patients.  Critical care was necessary to treat or prevent imminent or life-threatening deterioration.  Critical care was time spent personally by me on the following activities: development of treatment plan with patient and/or surrogate as well as nursing, discussions with consultants, evaluation of patient's response to treatment, examination of patient, obtaining history from patient or surrogate, ordering and performing treatments and interventions, ordering and review of laboratory studies, ordering and review of radiographic studies, pulse oximetry and re-evaluation of patient's  condition.  Signature:   Delfin Gant, NP-C [facility] After hours pager:  [phone]. 03/09/2019, 9:03 AM

## 2019-03-09 NOTE — Progress Notes (Signed)
Peripherally Inserted Central Catheter/Midline Placement  The IV Nurse has discussed with the patient and/or persons authorized to consent for the patient, the purpose of this procedure and the potential benefits and risks involved with this procedure.  The benefits include less needle sticks, lab draws from the catheter, and the patient may be discharged home with the catheter. Risks include, but not limited to, infection, bleeding, blood clot (thrombus formation), and puncture of an artery; nerve damage and irregular heartbeat and possibility to perform a PICC exchange if needed/ordered by physician.  Alternatives to this procedure were also discussed.  Bard Power PICC patient education guide, fact sheet on infection prevention and patient information card has been provided to patient /or left at bedside.    PICC/Midline Placement Documentation  PICC Double Lumen 03/09/19 PICC Right Brachial 42 cm 0 cm (Active)  Indication for Insertion or Continuance of Line Prolonged intravenous therapies 03/09/19 1659  Exposed Catheter (cm) 0 cm 03/09/19 1659  Site Assessment Clean;Dry;Intact 03/09/19 1659  Lumen #1 Status Flushed;Blood return noted 03/09/19 1659  Lumen #2 Status Flushed;Blood return noted 03/09/19 1659  Dressing Type Transparent 03/09/19 1659  Dressing Status Clean;Dry;Intact;Antimicrobial disc in place;Other (Comment) 03/09/19 1659  Dressing Intervention New dressing 03/09/19 9604  Dressing Change Due 03/09/19 03/09/19 1659    Telephone consent signed by daughter   Synthia Innocent 03/09/2019, 5:00 PM

## 2019-03-09 NOTE — Procedures (Signed)
Extubation Procedure Note  Patient Details:   Name: Robin Snow DOB: July 17, 1940 MRN: [id number]   Airway Documentation:    Vent end date: 03/09/19 Vent end time: 1610   Evaluation  O2 sats: stable throughout Complications: No apparent complications Patient did tolerate procedure well. Bilateral Breath Sounds: Clear, Diminished   Yes   Patient extubated per MD order. Positive cuff leak noted. No stridor. Patient placed on 4L [place] tolerating well at this time. RN at bedside. Vitals are stable.   Herbie Saxon Jamaira Sherk 03/09/2019, 4:15 PM

## 2019-03-09 NOTE — Progress Notes (Signed)
[facility] for Infectious Disease  Date of Admission:  02/22/2019      Total days of antibiotics 16  Day 16 Ceftriaxone            ASSESSMENT: Robin Snow is a 78 y.o. female with recurrent Ecoli bacteremia with large abscess compromising right total knee prosthesis with extension up to her hip now s/p AKA on 11/25. This has been complicated by excessive bleeding/hematoma in the setting of chronic anticoagulation with warfarin. [name] is considering a right hip disarticulation for further control over infection but no plans currently. Should she not require further surgery, decline or not be a candidate for any further intervention would anticipate a long course to treat for deep infection/septic arthritis/osteomyelitis and now suspected aortic valve endocarditis.   ?AV EColi Endocarditis = small mobile 2 mm x 2 mm mass suspicious for vegetation noted on aortic valve. No mention of lead associated endocarditis. Palliative meeting and POA being determined for goals of care given co morbidities.   Respiratory failure following surgery - PCCM following. Not weaning well or consistently. Not sure what her wishes would be      PLAN: 1. Continue Ceftriaxone IV with duration pending further surgical decisions 2. OK for PICC line to be placed.    Principal Problem:   Bacteremia due to Escherichia coli Active Problems:   Effusion of right knee joint   Diabetes mellitus with complication (HCC)   Anemia   Supratherapeutic INR   Sinus pause: 4.02-4.80sec per telemetry 09/08/2016   Sepsis ([place])   Closed dislocation of right knee   AF (paroxysmal atrial fibrillation) (HCC)   Iron deficiency anemia due to chronic blood loss   Dislocation of right knee   Osteomyelitis of right knee region [facility])   Severe protein-calorie malnutrition (HCC)   Abscess of thigh   Chronic osteomyelitis of right femur with draining sinus ([place])   Infection of total right knee replacement  (HCC)   Pressure injury of skin   Acute respiratory failure ([place])   . chlorhexidine  15 mL Mouth Rinse BID  . chlorhexidine gluconate (MEDLINE KIT)  15 mL Mouth Rinse BID  . Chlorhexidine Gluconate Cloth  6 each Topical Daily  . cholecalciferol  2,000 Units Per Tube BID  . ferrous sulfate  300 mg Per Tube BID WC  . gabapentin  300 mg Per Tube TID  . insulin aspart  0-15 Units Subcutaneous Q4H  . mouth rinse  15 mL Mouth Rinse q12n4p  . metoprolol tartrate  50 mg Per Tube BID  . pantoprazole sodium  40 mg Per Tube BID  . pravastatin  20 mg Per Tube q1800  . senna-docusate  2 tablet Per Tube BID  . sodium chloride flush  3 mL Intravenous Q12H  . Vitamin D (Ergocalciferol)  50,000 Units Per Tube Q7 days    SUBJECTIVE: Remains intubated. Awake and writing notes to me - says she has to have a BM.    Review of Systems: Review of Systems  Unable to perform ROS: Intubated    Allergies  Allergen Reactions  . Aspirin Hives and Swelling    Angioedema   . Rofecoxib Hives  . Hydrocodone Hives    Tolerates oxycodone and tramadol    OBJECTIVE: Vitals:   03/09/19 1500 03/09/19 1600 03/09/19 1604 03/09/19 1612  BP: 133/65 (!) 146/105    Pulse: (!) 48 (!) 48    Resp: 15 14    Temp:  98.2 F (36.8 C)   TempSrc:   Oral   SpO2: 100% 100%  100%  Weight:      Height:       Body mass index is 33.53 kg/m.  Physical Exam Vitals signs and nursing note reviewed.  Constitutional:      Appearance: She is well-developed.     Comments: Comfortable on ventilator.   HENT:     Mouth/Throat:     Mouth: No oral lesions.     Dentition: Normal dentition. No dental abscesses.     Pharynx: No oropharyngeal exudate.  Cardiovascular:     Rate and Rhythm: Regular rhythm. Tachycardia present.     Heart sounds: Normal heart sounds. No murmur.     Comments: Moments of rapid AF/SVT on telemetry up to 180 at times Pulmonary:     Effort: Pulmonary effort is normal.     Breath sounds: Normal  breath sounds.     Comments: 30% FiO2 and weaning currently.  Abdominal:     General: There is no distension.     Palpations: Abdomen is soft.     Tenderness: There is no abdominal tenderness.  Musculoskeletal:     Comments: R AKA vac dressing dry/clean without any surrounding areas of erythema. Generalized pitting edema noted throughout b/l hips and thighs.   Lymphadenopathy:     Cervical: No cervical adenopathy.  Skin:    General: Skin is warm and dry.     Capillary Refill: Capillary refill takes less than 2 seconds.     Findings: No rash.  Neurological:     Mental Status: She is alert.     Comments: Alert. Following commands and nods to questions appropriately.      Lab Results Lab Results  Component Value Date   WBC 5.7 03/09/2019   HGB 7.5 (L) 03/09/2019   HCT 24.5 (L) 03/09/2019   MCV 96.8 03/09/2019   PLT 195 03/09/2019    Lab Results  Component Value Date   CREATININE 0.86 03/09/2019   BUN 30 (H) 03/09/2019   NA 148 (H) 03/09/2019   K 3.2 (L) 03/09/2019   CL 107 03/09/2019   CO2 30 03/09/2019    Lab Results  Component Value Date   ALT 13 02/04/2019   AST 15 02/04/2019   ALKPHOS 79 02/04/2019   BILITOT 0.9 02/04/2019     Microbiology: No results found for this or any previous visit (from the past 240 hour(s)).  Janene Madeira, MSN, NP-C [facility] for Infectious Disease [email or web address] Pager: [phone] Office: [phone] [place]: [phone]

## 2019-03-10 DIAGNOSIS — B9620 Unspecified Escherichia coli [E. coli] as the cause of diseases classified elsewhere: Secondary | ICD-10-CM | POA: Diagnosis not present

## 2019-03-10 DIAGNOSIS — I471 Supraventricular tachycardia: Secondary | ICD-10-CM

## 2019-03-10 DIAGNOSIS — R7881 Bacteremia: Secondary | ICD-10-CM | POA: Diagnosis not present

## 2019-03-10 LAB — CBC
HCT: 24.7 % — ABNORMAL LOW (ref 36.0–46.0)
Hemoglobin: 7.6 g/dL — ABNORMAL LOW (ref 12.0–15.0)
MCH: 29.9 pg (ref 26.0–34.0)
MCHC: 30.8 g/dL (ref 30.0–36.0)
MCV: 97.2 fL (ref 80.0–100.0)
Platelets: 211 10*3/uL (ref 150–400)
RBC: 2.54 MIL/uL — ABNORMAL LOW (ref 3.87–5.11)
RDW: 17.1 % — ABNORMAL HIGH (ref 11.5–15.5)
WBC: 7 10*3/uL (ref 4.0–10.5)
nRBC: 0.6 % — ABNORMAL HIGH (ref 0.0–0.2)

## 2019-03-10 LAB — BASIC METABOLIC PANEL
Anion gap: 9 (ref 5–15)
BUN: 21 mg/dL (ref 8–23)
CO2: 27 mmol/L (ref 22–32)
Calcium: 8.1 mg/dL — ABNORMAL LOW (ref 8.9–10.3)
Chloride: 106 mmol/L (ref 98–111)
Creatinine, Ser: 0.81 mg/dL (ref 0.44–1.00)
GFR calc Af Amer: >60 mL/min (ref >60)
GFR calc non Af Amer: >60 mL/min (ref >60)
Glucose, Bld: 90 mg/dL (ref 70–99)
Potassium: 3.3 mmol/L — ABNORMAL LOW (ref 3.5–5.1)
Sodium: 142 mmol/L (ref 135–145)

## 2019-03-10 LAB — GLUCOSE, CAPILLARY
Glucose-Capillary: 88 mg/dL (ref 70–99)
Glucose-Capillary: 93 mg/dL (ref 70–99)
Glucose-Capillary: 101 mg/dL — ABNORMAL HIGH (ref 70–99)
Glucose-Capillary: 145 mg/dL — ABNORMAL HIGH (ref 70–99)
Glucose-Capillary: 123 mg/dL — ABNORMAL HIGH (ref 70–99)

## 2019-03-10 LAB — HEPARIN LEVEL (UNFRACTIONATED)
Heparin Unfractionated: 0.26 IU/mL — ABNORMAL LOW (ref 0.30–0.70)
Heparin Unfractionated: 0.19 IU/mL — ABNORMAL LOW (ref 0.30–0.70)

## 2019-03-10 MED ORDER — GERHARDT'S BUTT CREAM
TOPICAL_CREAM | Freq: Three times a day (TID) | CUTANEOUS | Status: DC
  Administered 2019-03-10 – 2019-03-11 (×3): via TOPICAL
  Administered 2019-03-11: 1 via TOPICAL
  Administered 2019-03-12 – 2019-03-13 (×5): via TOPICAL
  Administered 2019-03-14: 1 via TOPICAL
  Administered 2019-03-14 – 2019-03-20 (×16): via TOPICAL
  Filled 2019-03-10 (×2): qty 1

## 2019-03-10 MED ORDER — DILTIAZEM HCL ER COATED BEADS 120 MG PO CP24
120.0000 mg | ORAL_CAPSULE | Freq: Every day | ORAL | Status: DC
  Administered 2019-03-10 – 2019-03-29 (×18): 120 mg via ORAL
  Filled 2019-03-10 (×19): qty 1

## 2019-03-10 MED ORDER — TRAMADOL HCL 50 MG PO TABS
50.0000 mg | ORAL_TABLET | Freq: Four times a day (QID) | ORAL | Status: DC | PRN
  Administered 2019-03-10: 100 mg via ORAL
  Filled 2019-03-10: qty 2

## 2019-03-10 MED ORDER — METOPROLOL TARTRATE 5 MG/5ML IV SOLN
5.0000 mg | Freq: Once | INTRAVENOUS | Status: AC | PRN
  Administered 2019-03-10: 5 mg via INTRAVENOUS
  Filled 2019-03-10: qty 5

## 2019-03-10 MED ORDER — HYDROMORPHONE HCL 1 MG/ML IJ SOLN
INTRAMUSCULAR | Status: AC
  Administered 2019-03-10: 0.5 mg
  Filled 2019-03-10: qty 1

## 2019-03-10 MED ORDER — HYDROMORPHONE HCL 1 MG/ML IJ SOLN
0.5000 mg | INTRAMUSCULAR | Status: DC | PRN
  Administered 2019-03-10 – 2019-03-19 (×10): 0.5 mg via INTRAVENOUS
  Filled 2019-03-10 (×10): qty 1

## 2019-03-10 MED ORDER — ACETAMINOPHEN 325 MG PO TABS
650.0000 mg | ORAL_TABLET | Freq: Four times a day (QID) | ORAL | Status: DC | PRN
  Administered 2019-03-10 – 2019-03-25 (×7): 650 mg via ORAL
  Filled 2019-03-10 (×6): qty 2

## 2019-03-10 MED ORDER — TRAMADOL HCL 50 MG PO TABS: 50.0000 mg | ORAL_TABLET | Freq: Four times a day (QID) | ORAL | Status: DC

## 2019-03-10 MED ORDER — ENSURE ENLIVE PO LIQD
237.0000 mL | Freq: Two times a day (BID) | ORAL | Status: DC
  Administered 2019-03-10 – 2019-03-15 (×6): 237 mL via ORAL

## 2019-03-10 MED ORDER — TRAMADOL HCL 50 MG PO TABS
50.0000 mg | ORAL_TABLET | Freq: Four times a day (QID) | ORAL | Status: DC | PRN
  Administered 2019-03-10 – 2019-03-13 (×5): 100 mg via ORAL
  Administered 2019-03-13 – 2019-03-23 (×9): 50 mg via ORAL
  Administered 2019-03-27 – 2019-03-29 (×3): 100 mg via ORAL
  Filled 2019-03-10 (×3): qty 1
  Filled 2019-03-10 (×4): qty 2
  Filled 2019-03-10: qty 1
  Filled 2019-03-10: qty 2
  Filled 2019-03-10 (×2): qty 1
  Filled 2019-03-10: qty 2
  Filled 2019-03-10 (×2): qty 1
  Filled 2019-03-10 (×2): qty 2
  Filled 2019-03-10 (×2): qty 1

## 2019-03-10 MED ORDER — ADULT MULTIVITAMIN W/MINERALS CH
1.0000 | ORAL_TABLET | Freq: Every day | ORAL | Status: DC
  Administered 2019-03-10 – 2019-03-29 (×20): 1 via ORAL
  Filled 2019-03-10 (×20): qty 1

## 2019-03-10 MED ORDER — ACETAMINOPHEN 650 MG RE SUPP: 650.0000 mg | Freq: Four times a day (QID) | RECTAL | Status: DC | PRN

## 2019-03-10 MED ORDER — POTASSIUM CHLORIDE CRYS ER 20 MEQ PO TBCR
20.0000 meq | EXTENDED_RELEASE_TABLET | Freq: Two times a day (BID) | ORAL | Status: DC
  Administered 2019-03-10 – 2019-03-12 (×6): 20 meq via ORAL
  Filled 2019-03-10 (×7): qty 1

## 2019-03-10 MED ORDER — GABAPENTIN 100 MG PO CAPS
100.0000 mg | ORAL_CAPSULE | Freq: Three times a day (TID) | ORAL | Status: DC
  Administered 2019-03-10 – 2019-03-29 (×56): 100 mg via ORAL
  Filled 2019-03-10 (×57): qty 1

## 2019-03-10 NOTE — Evaluation (Signed)
Occupational Therapy Evaluation Patient Details Name: Robin Snow MRN: [id number] DOB: Oct 04, 1940 Today's Date: 03/10/2019    History of Present Illness 78  y/o female with history of atrial fibrillation, HTN, DM,CKD III had a fall at home when patient was trying to get out of the bed. Hx of R knee pain and swelling for the last few months. Found with sepsis secondary to E. coli bacteremia. Hospitalized from 10/29-11/4 with sepsis related to E. coli bacteremia, UTI, and bacterial arthritis right knee. Found to have dislocated right knee prothestic--02/22/19 s/p Closed reduction of right knee prosthetic dislocation. 11/25 underwent R AKA intubated with respiratory failure extubation 03/09/19   Clinical Impression   Patient is s/p R AKA surgery resulting in functional limitations due to the deficits listed below (see OT problem list). Pt requires total +2 pivot helicopter to EOB and static sitting max (A). Pt is from home with family (A) to stand pivot and bathroom transfers. Pt could reach min (A) and decrease burden of caregiver support.  Patient will benefit from skilled OT acutely to increase independence and safety with ADLS to allow discharge CIR.     Follow Up Recommendations  CIR    Equipment Recommendations  Wheelchair (measurements OT);Wheelchair cushion (measurements OT);Hospital bed;Other (comment)(lift)    Recommendations for Other Services Rehab consult     Precautions / Restrictions Precautions Precautions: Fall Precaution Comments: wound vac R AKA Restrictions Weight Bearing Restrictions: No RLE Weight Bearing: Non weight bearing      Mobility Bed Mobility Overal bed mobility: Needs Assistance Bed Mobility: Sit to Supine;Rolling;Supine to Sit Rolling: Max assist;Total assist;+2 for physical assistance;+2 for safety/equipment   Supine to sit: Total assist;+2 for physical assistance Sit to supine: Total assist;+2 for physical assistance   General bed mobility  comments: pt requires max (A) at EOB but able to hold min guard 15-20 seconds. pt able to anterior weight shift with support with BIL LE. pt able to unweight and complete knee extension x 10 reps, unable to complete hip flexion  Transfers                 General transfer comment: deferred next session.     Balance Overall balance assessment: Needs assistance Sitting-balance support: Bilateral upper extremity supported;Feet supported Sitting balance-Leahy Scale: Poor Sitting balance - Comments: needs cues and facilitation for static posture                                   ADL either performed or assessed with clinical judgement   ADL Overall ADL's : Needs assistance/impaired Eating/Feeding: Set up;Bed level   Grooming: Set up;Bed level   Upper Body Bathing: Moderate assistance;Bed level   Lower Body Bathing: Total assistance;Bed level             Toilet Transfer Details (indicate cue type and reason): unable to demonstrates this session with simulation. EOB sitting only            General ADL Comments: pt progressed to EOB with stable vital signs for core activation and dynamic sitting balance attempts     Vision         Perception     Praxis      Pertinent Vitals/Pain Pain Assessment: Faces Faces Pain Scale: Hurts even more Pain Location: R AKA Pain Descriptors / Indicators: Grimacing;Operative site guarding Pain Intervention(s): Monitored during session;Premedicated before session;Repositioned     Hand Dominance Right  Extremity/Trunk Assessment Upper Extremity Assessment Upper Extremity Assessment: Generalized weakness   Lower Extremity Assessment Lower Extremity Assessment: Defer to PT evaluation   Cervical / Trunk Assessment Cervical / Trunk Assessment: Kyphotic Cervical / Trunk Exceptions: foward head, elevated scapula   Communication Communication Communication: No difficulties   Cognition Arousal/Alertness:  Awake/alert Behavior During Therapy: WFL for tasks assessed/performed Overall Cognitive Status: Within Functional Limits for tasks assessed                                 General Comments: slow progressing but able to give very detailed responses with time   General Comments  risk for skin break down due to supine positioning recommend frequent position changes, pt edem pitting L LE. HR 90s entire session    Exercises Exercises: Shoulder;General Lower Extremity;General Upper Extremity General Exercises - Upper Extremity Shoulder Flexion: AROM;Both;10 reps;Seated Shoulder Horizontal ABduction: Left;AROM;5 reps;Seated General Exercises - Lower Extremity Ankle Circles/Pumps: AROM;Left;Seated;10 reps Quad Sets: AROM;Left;10 reps;Seated Toe Raises: AROM;Left;10 reps;Seated   Shoulder Instructions      Home Living Family/patient expects to be discharged to:: Private residence Living Arrangements: Children Available Help at Discharge: Family;Available 24 hours/day(daughter nadeline, son and grandson "chris") Type of Home: House Home Access: Stairs to enter [employer] of Steps: 3 Entrance Stairs-Rails: None Home Layout: Two level;Able to live on main level with bedroom/bathroom Alternate Level Stairs-Number of Steps: full flight   Bathroom Shower/Tub: Chief Strategy Officer: Standard Bathroom Accessibility: Yes How Accessible: Accessible via walker Home Equipment: Walker - 2 wheels;Bedside commode;Shower seat;Wheelchair - manual;Hospital bed   Additional Comments: Pt reports she got a new wheelchair with elevating leg rests last admission      Prior Functioning/Environment Level of Independence: Needs assistance  Gait / Transfers Assistance Needed: Was only doing stand pivot transfers since last admission with help ADL's / Homemaking Assistance Needed: Reports she could partially bath and dress herself, but needed alot of A with toileting    Comments: daughter reports she only transfered with 2 family members present at all time        OT Problem List: Decreased strength;Impaired balance (sitting and/or standing);Decreased activity tolerance;Decreased safety awareness;Obesity;Pain;Increased edema;Decreased knowledge of use of DME or AE;Decreased knowledge of precautions      OT Treatment/Interventions: Self-care/ADL training;Therapeutic exercise;Neuromuscular education;Energy conservation;DME and/or AE instruction;Manual therapy;Modalities;Therapeutic activities;Patient/family education;Balance training    OT Goals(Current goals can be found in the care plan section) Acute Rehab OT Goals Patient Stated Goal: to return home and cook OT Goal Formulation: With patient/family Time For Goal Achievement: 03/24/19 Potential to Achieve Goals: Good  OT Frequency: Min 2X/week   Barriers to D/C:            Co-evaluation PT/OT/SLP Co-Evaluation/Treatment: Yes Reason for Co-Treatment: Complexity of the patient's impairments (multi-system involvement);Necessary to address cognition/behavior during functional activity;For patient/therapist safety;To address functional/ADL transfers   OT goals addressed during session: Strengthening/ROM;ADL's and self-care      AM-PAC OT "6 Clicks" Daily Activity     Outcome Measure Help from another person eating meals?: A Little Help from another person taking care of personal grooming?: A Lot Help from another person toileting, which includes using toliet, bedpan, or urinal?: Total Help from another person bathing (including washing, rinsing, drying)?: A Lot Help from another person to put on and taking off regular upper body clothing?: A Lot Help from another person to put on and taking off regular lower  body clothing?: Total 6 Click Score: 11   End of Session Nurse Communication: Mobility status;Precautions;Weight bearing status;Need for lift equipment  Activity Tolerance: Patient  tolerated treatment well Patient left: in bed;with call bell/phone within reach;with bed alarm set;with family/visitor present  OT Visit Diagnosis: Unsteadiness on feet (R26.81);Muscle weakness (generalized) (M62.81);Pain Pain - Right/Left: Right Pain - part of body: Leg                Time: 5784-6962 OT Time Calculation (min): 28 min Charges:  OT General Charges $OT Visit: 1 Visit OT Evaluation $OT Eval Moderate Complexity: 1 Mod   Brynn, OTR/L  Acute Rehabilitation Services Pager: [phone] Office: [phone] .   Mateo Flow 03/10/2019, 11:36 AM

## 2019-03-10 NOTE — Progress Notes (Signed)
[facility] Progress Note Patient Name: Robin Snow DOB: 1940-12-20 MRN: [id number]   Date of Service  03/10/2019  HPI/Events of Note  Patient with know history of tachy brady syndrome. Home meds of diltiazem and metoprolol resumed today. Pacemaker in place  eICU Interventions  BP 128/78  HR 150s Ordered another dose of metoprolol 5 mg IV     Intervention Category Major Interventions: Arrhythmia - evaluation and management  Judd Lien 03/10/2019, 11:43 PM

## 2019-03-10 NOTE — Evaluation (Addendum)
Physical Therapy Evaluation Patient Details Name: Robin Snow MRN: [id number] DOB: [birth year] Today's Date: 03/10/2019   History of Present Illness  78  y/o female with history of atrial fibrillation, HTN, DM,CKD III had a fall at home when patient was trying to get out of the bed. Hx of R knee pain and swelling for the last few months. Found with sepsis secondary to E. coli bacteremia. Hospitalized from 10/29-11/4 with sepsis related to E. coli bacteremia, UTI, and bacterial arthritis right knee. Found to have dislocated right knee prothestic--02/22/19 s/p Closed reduction of right knee prosthetic dislocation. 11/25 underwent R AKA intubated with respiratory failure extubation 03/09/19  Clinical Impression   Pt presents with moderate R residual LE pain, LE weakness, impaired sitting balance, difficulty performing bed mobility, and decreased activity tolerance post-operatively. Pt to benefit from acute PT to address deficits. Pt required total +2 assist for bed mobility this session, and required mod-max assist for sitting balance EOB. Pt motivated to progress mobility, PT feels pt would benefit from CIR to maximize function for d/c home with supportive family. VSS during session. PT to progress mobility as tolerated, and will continue to follow acutely.      Follow Up Recommendations CIR    Equipment Recommendations  Other (comment)(defer to next venue)    Recommendations for Other Services       Precautions / Restrictions Precautions Precautions: Fall Precaution Comments: wound vac R AKA Restrictions RLE Weight Bearing: Non weight bearing      Mobility  Bed Mobility Overal bed mobility: Needs Assistance Bed Mobility: Sit to Supine;Rolling;Supine to Sit Rolling: Max assist;+2 for physical assistance;+2 for safety/equipment   Supine to sit: Total assist;+2 for physical assistance Sit to supine: Total assist;+2 for physical assistance   General bed mobility comments: rolling  with max assist +2 for positioning with bed pads, max-total assist +2 for supine<>sit for trunk elevation, LE lifting and translation to and from EOB, scooting up up in bed with use of bed pads.  Transfers   Equipment used: (NT)             General transfer comment: deferred next session.   Ambulation/Gait                Stairs            Wheelchair Mobility    Modified Rankin (Stroke Patients Only)       Balance Overall balance assessment: Needs assistance Sitting-balance support: Bilateral upper extremity supported;Feet supported Sitting balance-Leahy Scale: Poor Sitting balance - Comments: Pt required mod trunk support in seated position, able to sit EOB without PT/OT support for ~15 seconds before fatiguing and requiring mod assist for trunk support again.                                     Pertinent Vitals/Pain Pain Assessment: Faces Faces Pain Scale: Hurts even more Pain Location: R AKA Pain Descriptors / Indicators: Grimacing;Operative site guarding;Moaning;Guarding Pain Intervention(s): Limited activity within patient's tolerance;Monitored during session;Premedicated before session;Repositioned    Home Living Family/patient expects to be discharged to:: Private residence Living Arrangements: Children Available Help at Discharge: Family;Available 24 hours/day(daughter nadeline, son and grandson "chris") Type of Home: House Home Access: Stairs to enter Entrance Stairs-Rails: None Entrance Stairs-Number of Steps: 3 Home Layout: Two level;Able to live on main level with bedroom/bathroom Home Equipment: Dan Humphreys - 2 wheels;Bedside commode;Shower seat;Wheelchair - manual;Hospital bed Additional  Comments: Pt reports she got a new wheelchair with elevating leg rests last admission    Prior Function Level of Independence: Needs assistance   Gait / Transfers Assistance Needed: Was only doing stand pivot transfers since last admission with  help  ADL's / Homemaking Assistance Needed: Reports she could partially bath and dress herself, but needed alot of A with toileting  Comments: daughter reports pt only transfered with 2 family members present at all time     Hand Dominance   Dominant Hand: Right    Extremity/Trunk Assessment   Upper Extremity Assessment Upper Extremity Assessment: Defer to OT evaluation    Lower Extremity Assessment Lower Extremity Assessment: Generalized weakness;RLE deficits/detail;LLE deficits/detail RLE Deficits / Details: weak hip extension contraction noted, not further tested due to pt pain. LLE Deficits / Details: 2/5 hip flexion; 3/5 knee extension, DF/PF. DF ROM limited due to ankle and foot edema    Cervical / Trunk Assessment Cervical / Trunk Assessment: Kyphotic Cervical / Trunk Exceptions: forward head rounded shoulder posture  Communication   Communication: No difficulties  Cognition Arousal/Alertness: Awake/alert Behavior During Therapy: WFL for tasks assessed/performed Overall Cognitive Status: Within Functional Limits for tasks assessed                                 General Comments: pleasant, talkative with slightly increased processing time      General Comments General comments (skin integrity, edema, etc.): +3 pitting edema L foot and lower leg, PT recommending quad sets (10X, 3X a day) and ankle pumps (as much as possible) for muscular activation and contraction for edema control. HR stable 70s-90s bpm during session, BP stable.    Exercises General Exercises - Upper Extremity Shoulder Flexion: AROM;Both;10 reps;Seated Shoulder Horizontal ABduction: Left;AROM;5 reps;Seated General Exercises - Lower Extremity Ankle Circles/Pumps: AROM;Left;10 reps;Supine Quad Sets: AROM;Left;10 reps;Supine Toe Raises: AROM;Left;10 reps;Seated   Assessment/Plan    PT Assessment Patient needs continued PT services  PT Problem List Pain;Obesity;Decreased knowledge  of use of DME;Decreased mobility;Decreased balance;Decreased activity tolerance;Decreased range of motion;Decreased strength;Decreased safety awareness;Decreased skin integrity       PT Treatment Interventions Patient/family education;Therapeutic exercise;Therapeutic activities;Functional mobility training;Stair training;Gait training;DME instruction;Wheelchair mobility training;Balance training    PT Goals (Current goals can be found in the Care Plan section)  Acute Rehab PT Goals Patient Stated Goal: to return home and cook PT Goal Formulation: With patient Time For Goal Achievement: 03/24/19 Potential to Achieve Goals: Fair    Frequency Min 3X/week   Barriers to discharge        Co-evaluation PT/OT/SLP Co-Evaluation/Treatment: Yes Reason for Co-Treatment: Complexity of the patient's impairments (multi-system involvement);For patient/therapist safety PT goals addressed during session: Mobility/safety with mobility;Balance OT goals addressed during session: Strengthening/ROM;ADL's and self-care       AM-PAC PT "6 Clicks" Mobility  Outcome Measure Help needed turning from your back to your side while in a flat bed without using bedrails?: Total Help needed moving from lying on your back to sitting on the side of a flat bed without using bedrails?: Total Help needed moving to and from a bed to a chair (including a wheelchair)?: Total Help needed standing up from a chair using your arms (e.g., wheelchair or bedside chair)?: Total Help needed to walk in hospital room?: Total Help needed climbing 3-5 steps with a railing? : Total 6 Click Score: 6    End of Session   Activity Tolerance: Patient limited  by pain;Patient limited by fatigue Patient left: in bed;with call bell/phone within reach;with bed alarm set Nurse Communication: Mobility status PT Visit Diagnosis: Pain;Difficulty in walking, not elsewhere classified (R26.2);Other abnormalities of gait and mobility  (R26.89);History of falling (Z91.81);Muscle weakness (generalized) (M62.81) Pain - Right/Left: Right Pain - part of body: Knee    Time: 9528-4132 PT Time Calculation (min) (ACUTE ONLY): 28 min   Charges:   PT Evaluation $PT Re-evaluation: 1 Re-eval          France Noyce E, PT Acute Rehabilitation Services Pager [phone]  Office [phone]   Madysin Crisp D Despina Hidden 03/10/2019, 12:48 PM

## 2019-03-10 NOTE — Progress Notes (Addendum)
Rehab Admissions Coordinator Note:  Patient was screened by Cleatrice Burke for appropriateness for an Inpatient Acute Rehab Consult per PT and OT recs. .  At this time, we are recommending Inpatient Rehab consult. I will contact PCCM for order.  Shuree, Brossart RN MSN 03/10/2019, 3:20 PM  I can be reached at [phone].

## 2019-03-10 NOTE — Progress Notes (Addendum)
NAME:  Robin Snow, MRN:  [id number], DOB:  27-Feb-1941, LOS: 83 ADMISSION DATE:  02/22/2019, CONSULTATION DATE: 11/25 REFERRING MD: Dr. Nevada Crane, CHIEF COMPLAINT: Postop respiratory failure, s/p BKA for septic knee prosthesis  Brief History   78 yo F with R knee replacement, s/p revision in 2018, presenting with prosthetic dislocation and abscess. S/p closed reduction, drainage 11/18. Treating for e.coli bacteremia. Underwent R AKA 11/25 and was intubated after procedure for respiratory failure.  Past Medical History  has Permanent atrial fibrillation ([place]); GERD (gastroesophageal reflux disease); Embolic stroke ([place]); Right hemiparesis ([place]); Chronic venous insufficiency; Essential hypertension; HLD (hyperlipidemia); Diabetes mellitus with complication ([place]); Anemia; CKD (chronic kidney disease) stage 3, GFR 30-59 ml/min; Chronic pain syndrome; Painful total knee replacement ([place]); Physical deconditioning; Sinus bradycardia; Orthostatic hypotension; Supratherapeutic INR; Chronic diastolic CHF (congestive heart failure) ([place]); Sinus pause: 4.02-4.80sec per telemetry 09/08/2016; Tachy-brady syndrome ([place]); Long term (current) use of anticoagulants; Obstructive sleep apnea; Urinary frequency; Pre-op evaluation; Warm antibody hemolytic anemia; Foot pain; Sepsis ([place]); Atrial fibrillation with RVR ([place]); Acute cystitis with hematuria; Closed dislocation of right knee; Effusion of right knee joint; AF (paroxysmal atrial fibrillation) ([place]); Bacteremia due to Escherichia coli; Iron deficiency anemia due to chronic blood loss; Dislocation of right knee; Osteomyelitis of right knee region [facility]); Severe protein-calorie malnutrition ([place]); Abscess of thigh; Chronic osteomyelitis of right femur with draining sinus ([place]); Infection of total right knee replacement ([place]); Pressure injury of skin; and Acute respiratory failure ([place]) on their problem list.  [place] Hospital Events   11/18 admitted. R knee closed  reduction, drainage 11/21 ongoing tx for e coli bacteremia 11/23 PT rec dc to SNF when appropriate, family refuses. ID consulted for abx duration 11/24 ID recommend TEE, cardiology consulted. 11/25 R AKA for septic abscess and osteomyelitis . Reintubated following surgery.To ICU  11/26 hypotensive. Got transfused (total of 4 units); pressor dependent. Surgery noting she would still need hip disarticulation once stable 11/27: hgb down from 7.5 to 4.8 overnight. Still sig output from surgical site drain.3 units PRBC. 2 units FFP 11/29: Hemoglobin has been stable 12/1 Off pressors, weaning on PSV 12/3 Extubated   Consults:  ID Ortho PCCM Cardiology  Procedures:  11/18 > closed reduction of R knee prosthetic, drainage 11/25 > R AKA 11/25 ETT > 12/3  Significant Diagnostic Tests:  11/18 R knee plain film > Large R knee effusion, anterior dislocation of femoral component to tibial component   11/27 CT abd/pelvis>  1. No evidence of retroperitoneal hemorrhage. 2. Mild colonic ileus.  No evidence of bowel obstruction. 3. Moderate diffuse body wall edema. 4. Moderate left renal parenchymal atrophy.  12/2 CXR > Stable cardiomegaly and mild edema. Stable bibasilar airspace disease likely reflects atelectasis. Infection is not excluded. Stable small bilateral pleural effusions.  Micro Data:  11/18 SARS Cov2 > neg  11/18 synovial fluid > Moderate E.Coli 11/18 BCx > e.coli 11/23 and 11/25 BCx > neg   Antimicrobials:  Rocephin 11/19 > (6 wk course)   Interim history/subjective:  RN reports not acute events overnight, states she feels well this morning. She is requesting something to eat.   Objective   Blood pressure 139/73, pulse (!) 101, temperature 100 F (37.8 C), temperature source Oral, resp. rate 19, height 5\' 7"  (1.702 m), weight 97.8 kg, SpO2 96 %.    Vent Mode: PSV;CPAP FiO2 (%):  [30 %] 30 % PEEP:  [5 cmH20] 5 cmH20 Pressure Support:  [8 cmH20] 8 cmH20    Intake/Output Summary (Last  24 hours) at 03/10/2019 2595 Last data filed at 03/10/2019 0700 Gross per 24 hour  Intake 2357.55 ml  Output 1500 ml  Net 857.55 ml   Filed Weights   03/08/19 0500 03/09/19 0500 03/10/19 0500  Weight: 106.4 kg 97.1 kg 97.8 kg    Examination: . General: Chronically ill appearing elderly female lying in bed on [place], in NAD HEENT: [place]/AT, MM pink/moist, PERRL,  Neuro: Alert and oriented completely, no neuro deficits  CV: s1s2 regular rate and rhythm, no murmur, rubs, or gallops,  PULM:  Clear to ascultation bilaterally, no increased work of breathing GI: soft, bowel sounds active in all 4 quadrants, non-tender, non-distended Extremities: warm/dry, no edema  Skin: no rashes or lesions  Resolved problems:  Circulatory shock  Hypophosphatemia   Assessment & Plan:   Postoperative respiratory failure  -In the setting of E-coli bacteremia, recent AKA, and significant deconditioning  -Extubated 12/3 P:  Wean supplemental oxygen for sats greater than 92 Follow cultures Ensure pulmonary hygiene  Endocarditis E.Colie bacteriemia  -TEE completed 12/2 with evidence of  Mobile mass and thickening of aortic valve suspicious for aortic valvular vegetation  P: Coordinate with ID to determine if consult to EP is need for pacemaker removal  Continue IV antibiotics Repeat cultures have remained negative    Severe acute blood loss anemia  -Stabilized post transfusion. CT neg for hematoma.  P:  HBG has remained stable post transfusion  Follow transfusion protocol Trend CBC   Chronic kidney disease stage III  -[place] creatinine between 0.9-1.1  P:  Follow renal function / urine output Trend Bmet Strict I&O    Atrial fibrillation  Sick sinus syndrome s/p pacemaker  -Baseline anticoagulated with coumadin  P:  Continue  Heparin drip Follow INR Resume home cardizem  Continuous telemetry    Protein calorie malnutrition  P:  Dietary consult  If  passed swallow eval start oral diet  Nutritional supplementation  Sever deconditioning / Debility  -Family declined skilled nursing facility  P:  PT/OT eval    Hypokalemia  P: Trend Bmet Replete as needed   Best practice:  Diet: Tube Feeds Pain/Anxiety/Delirium protocol (if indicated): Precedex, fentanyl VAP protocol (if indicated): In place DVT prophylaxis: SCD GI prophylaxis: Protonix Glucose control: SSI Mobility: Bedrest Code Status: Full code Family Communication: Daughter [name] called and updated regarding progress 12/2 Disposition: ICU  Critical care:   Delfin Gant, NP-C [facility] After hours pager: [phone]. 03/10/2019, 9:04 AM  Critical care attending attestation note:  Patient seen and examined and relevant ancillary tests reviewed.  I agree with the assessment and plan of care as outlined by Raymon Mutton, NP.   Synopsis of assessment and plan:  78 year old woman with E. coli bacteremia due to infected right knee prosthesis.  Status post AKA. Successfully extubated yesterday.  No respiratory complaints.  States that she feels hungry.  On examination just, in no distress.  No focal deficits.  Wound VAC in place with minimal drainage.  Noted to have episodes of tachycardia with known atrial fibrillation.  Assessment  Progressing well from a sepsis standpoint.  She will need a prolonged course of antibiotics.  Tachybradycardia syndrome.  She has a pacemaker in place. Have already restarted metoprolol.  Will restart home Cardizem for better rate control.  If tachycardia remains under control she can transfer to floor tomorrow.  Lynnell Catalan, MD [facility] ICU Physician [facility] [place] [facility]  Pager: [phone] Mobile: [phone] After hours: [phone].  03/10/2019, 3:52  PM

## 2019-03-10 NOTE — Progress Notes (Addendum)
ANTICOAGULATION CONSULT NOTE  Pharmacy Consult:  Heparin Indication: atrial fibrillation  Allergies  Allergen Reactions  . Aspirin Hives and Swelling    Angioedema   . Rofecoxib Hives  . Hydrocodone Hives    Tolerates oxycodone and tramadol    Vital Signs: Temp: 98.9 F (37.2 C) (12/04 0400) Temp Source: Axillary (12/04 0400) BP: 139/73 (12/04 0700) Pulse Rate: 101 (12/04 0700)  Heparin dosing weight = 83 kg  Labs: Recent Labs    03/08/19 0511 03/09/19 0200 03/09/19 0500 03/09/19 1400 03/10/19 0500  HGB 7.2*  --  7.5*  --  7.6*  HCT 23.8*  --  24.5*  --  24.7*  PLT 184  --  195  --  211  LABPROT 18.1*  --   --   --   --   INR 1.5*  --   --   --   --   HEPARINUNFRC  --  0.61  --  0.51 0.26*  CREATININE 0.73  --  0.86  --  0.81    Estimated Creatinine Clearance: 67.7 mL/min (by C-G formula based on SCr of 0.81 mg/dL).  Assessment: 66 YOF presented with right knee pain and swelling along boils/blisters on skin.  Found to have E.coli bacteremia with AV endocarditis and abscess extending to hip that will eventually need hip disarticulation.  Hospital stay has been complicated by anemia and bleeding s/p recent AKA.  Patient has a history of Afib on Coumadin PTA and Pharmacy consulted to dose IV heparin while Coumadin remains on hold.  Heparin level is sub-therapeutic on 950 units/hr. Will increase back up to 1000 units/hr. Hgb is up and platelets are up. No overt bleeding reported. SCr stable.   Goal of Therapy:  Heparin level 0.3 - 0.5 units/ml Monitor platelets by anticoagulation protocol: Yes   Plan:  Increase heparin gtt to 1000 units/hr Heparin level in 8 hours.  Daily heparin level and CBC   Sloan Leiter, PharmD, BCPS, BCCCP Clinical Pharmacist Please refer to [facility] for [place] numbers 03/10/2019, 7:36 AM

## 2019-03-10 NOTE — Progress Notes (Signed)
[facility] for Infectious Disease  Date of Admission:  02/22/2019      Total days of antibiotics 17  Day 17 Ceftriaxone            ASSESSMENT: Robin Snow is a 78 y.o. female with recurrent Ecoli bacteremia with large abscess compromising right total knee prosthesis with extension up to her hip now s/p AKA on 11/25. This has been complicated by excessive bleeding/hematoma in the setting of chronic anticoagulation with warfarin. [name] is considering a right hip disarticulation for further control over infection but no plans currently. Will keep her on broader therapy with ceftriaxone until we get an update as to the status of her wound and surgery plans. Could narrow to cefazolin.   ?AV Endocarditis = small mobile 0.2 cm x 0.2 cm mobile mass suspicious for vegetation noted on aortic valve. No vegetations noted on the leads of her cardiac device. Given that the vegetation occupies the AV and in no close proximity to right atrial/ventricular wires would not think her device needs to be removed at this time since there is no definitive findings for lead associated endocarditis. There is not great, consistent guidance for gram negative endocarditis; there are some experts that recommend dual therapy with beta-lactam + gentamicin or with  fluoroquinolone. Using a FQ for 6 weeks would put her at high risk for CDiff infection and concern for QTc prolongation (baseline at hospitalization 408 ms). Her kidney function is intact for now and not sure it is worth the risk with adding gentamicin for native valve involvement. Will continue to consider.   Respiratory failure following surgery - PCCM following. Not weaning well or consistently. Not sure what her wishes would be     PLAN: 1. Continue Ceftriaxone IV with duration pending further surgical decisions    Principal Problem:   Bacteremia due to Escherichia coli Active Problems:   Effusion of right knee joint   Diabetes  mellitus with complication (HCC)   Anemia   Supratherapeutic INR   Sinus pause: 4.02-4.80sec per telemetry 09/08/2016   Sepsis ([place])   Closed dislocation of right knee   AF (paroxysmal atrial fibrillation) (HCC)   Iron deficiency anemia due to chronic blood loss   Dislocation of right knee   Osteomyelitis of right knee region ([place])   Severe protein-calorie malnutrition (HCC)   Abscess of thigh   Chronic osteomyelitis of right femur with draining sinus ([place])   Infection of total right knee replacement (HCC)   Pressure injury of skin   Acute respiratory failure ([place])   . chlorhexidine  15 mL Mouth Rinse BID  . chlorhexidine gluconate (MEDLINE KIT)  15 mL Mouth Rinse BID  . Chlorhexidine Gluconate Cloth  6 each Topical Daily  . cholecalciferol  2,000 Units Oral BID  . diltiazem  120 mg Oral Daily  . ferrous sulfate  300 mg Oral BID WC  . gabapentin  100 mg Oral TID  . insulin aspart  0-15 Units Subcutaneous Q4H  . mouth rinse  15 mL Mouth Rinse q12n4p  . metoprolol tartrate  50 mg Oral BID  . pantoprazole  40 mg Oral Daily  . potassium chloride  20 mEq Oral BID  . pravastatin  20 mg Oral q1800  . senna-docusate  2 tablet Oral BID  . sodium chloride flush  10-40 mL Intracatheter Q12H  . sodium chloride flush  3 mL Intravenous Q12H  . [START ON 03/16/2019] Vitamin D (Ergocalciferol)  50,000 Units Oral Q7 days    SUBJECTIVE: Extubated now and eating lunch tray. She feels "much much better." Still with waxing and waning pain mostly overlying the right thigh. No trouble eating or drinking and now on room air. Recalls our conversation yesterday about treating her for a heart valve infection.    Review of Systems: Review of Systems  Constitutional: Negative for chills and fever.  Respiratory: Negative for cough and shortness of breath.   Cardiovascular: Negative for chest pain and leg swelling.  Gastrointestinal: Negative for abdominal pain and vomiting.  Genitourinary: Negative  for dysuria.  Musculoskeletal: Positive for joint pain.  Skin: Negative for rash.  Neurological: Positive for weakness. Negative for dizziness.    Allergies  Allergen Reactions  . Aspirin Hives and Swelling    Angioedema   . Rofecoxib Hives  . Hydrocodone Hives    Tolerates oxycodone and tramadol    OBJECTIVE: Vitals:   03/10/19 0900 03/10/19 1000 03/10/19 1100 03/10/19 1200  BP: (!) 143/100 (!) 164/87 (!) 141/90 (!) 143/86  Pulse: (!) 110 (!) 102 76 73  Resp: (!) 22 (!) 23 (!) 25 (!) 22  Temp:    (!) 100.7 F (38.2 C)  TempSrc:    Oral  SpO2: 96% (!) 81% 93% 97%  Weight:      Height:       Body mass index is 33.77 kg/m.  Physical Exam Vitals signs and nursing note reviewed.  Constitutional:      Appearance: She is well-developed.     Comments: Seated in chair upright eating her lunch. No distress.   HENT:     Mouth/Throat:     Mouth: Mucous membranes are moist. No oral lesions.     Dentition: Normal dentition. No dental abscesses.     Pharynx: Oropharynx is clear. No oropharyngeal exudate.  Cardiovascular:     Rate and Rhythm: Regular rhythm. Tachycardia present.     Heart sounds: Normal heart sounds. No murmur.     Comments: AFib/SVT 130s on telemetry.  Pulmonary:     Effort: Pulmonary effort is normal.     Breath sounds: Normal breath sounds.     Comments: Room air with adequate oxygenation  Abdominal:     General: There is no distension.     Palpations: Abdomen is soft.     Tenderness: There is no abdominal tenderness.  Musculoskeletal:     Comments: R AKA vac dressing dry/clean without any surrounding areas of erythema. Generalized pitting edema noted throughout b/l hips and thighs.   Lymphadenopathy:     Cervical: No cervical adenopathy.  Skin:    General: Skin is warm and dry.     Capillary Refill: Capillary refill takes less than 2 seconds.     Findings: No rash.  Neurological:     Mental Status: She is alert.     Lab Results Lab Results   Component Value Date   WBC 7.0 03/10/2019   HGB 7.6 (L) 03/10/2019   HCT 24.7 (L) 03/10/2019   MCV 97.2 03/10/2019   PLT 211 03/10/2019    Lab Results  Component Value Date   CREATININE 0.81 03/10/2019   BUN 21 03/10/2019   NA 142 03/10/2019   K 3.3 (L) 03/10/2019   CL 106 03/10/2019   CO2 27 03/10/2019    Lab Results  Component Value Date   ALT 13 02/04/2019   AST 15 02/04/2019   ALKPHOS 79 02/04/2019   BILITOT 0.9 02/04/2019  Microbiology: No results found for this or any previous visit (from the past 240 hour(s)).  Janene Madeira, MSN, NP-C [facility] for Infectious Disease [email or web address] Pager: [phone] Office: [place]: [phone]

## 2019-03-10 NOTE — Evaluation (Signed)
Clinical/Bedside Swallow Evaluation Patient Details  Name: Robin Snow MRN: [id number] Date of Birth: 15-Jan-1941  Today's Date: 03/10/2019 Time: SLP Start Time (ACUTE ONLY): 0932 SLP Stop Time (ACUTE ONLY): 0950 SLP Time Calculation (min) (ACUTE ONLY): 18 min  Past Medical History:  Past Medical History:  Diagnosis Date  . AKI (acute kidney injury) (HCC) 04/2016  . Anemia   . Arthritis   . Chronic atrial fibrillation (HCC)   . Chronic edema   . Chronic venous insufficiency   . CKD (chronic kidney disease), stage III   . Coagulopathy (HCC)   . Diabetes mellitus    Type 2  . Diverticulosis   . Dyslipidemia   . GERD (gastroesophageal reflux disease)   . GI bleed 2015   a. lower GI from diverticular source 2015.  [name] Hx of transfusion of packed red blood cells   . Hypertension   . Morbid obesity (HCC)   . Obstructive sleep apnea    on CPAP  . Pulmonary hypertension (HCC)   . Pyelonephritis 04/2016  . Renal infarct (HCC)    a. 04/2016- adm with flank pain, ? pyelo vs renal infarct in setting of recent subtherapeutic INR.  [name] Sinus bradycardia   . Stroke [facility]) 2015   Past Surgical History:  Past Surgical History:  Procedure Laterality Date  . AMPUTATION Right 03/01/2019   Procedure: AMPUTATION ABOVE KNEE;  Surgeon: [name], MD;  Location: [facility] OR;  Service: Orthopedics;  Laterality: Right;  . CESAREAN SECTION    . COLONOSCOPY Left 01/24/2013   Procedure: COLONOSCOPY;  Surgeon: Willis Modena, MD;  Location: [facility] ENDOSCOPY;  Service: Endoscopy;  Laterality: Left;  . ESOPHAGOGASTRODUODENOSCOPY Left 01/23/2013   Procedure: ESOPHAGOGASTRODUODENOSCOPY (EGD);  Surgeon: Willis Modena, MD;  Location[facility] ENDOSCOPY;  Service: Endoscopy;  Laterality: Left;  . EYE SURGERY Bilateral    cataract surgery  . JOINT REPLACEMENT    . KNEE CLOSED REDUCTION Right 02/22/2019   Procedure: CLOSED MANIPULATION KNEE;  Surgeon: Roby Lofts, MD;  Location: [facility] OR;  Service: Orthopedics;   Laterality: Right;  . PACEMAKER IMPLANT [name] 09/09/2016   Procedure: Pacemaker Implant;  Surgeon: Marinus Maw, MD;  Location: [facility] INVASIVE CV LAB;  Service: Cardiovascular;  Laterality: [name];  . REPLACEMENT TOTAL KNEE BILATERAL    . TUBAL LIGATION     HPI:  Robin Snow is a 78 y.o. female with medical history significant of GERD, stroke (2015), paroxysmal atrial fibrillation, OSA,  hypertension, diabetes mellitus type II, and chronic kidney disease stage III.  Hospitalized from 10/29-11/4 with sepsis related to E. coli bacteremia, UTI, and bacterial arthritis right knee. Found to have dislocated right knee prothestic--02/22/19 closed reduction of right knee prosthetic dislocation. Underwent AKA 11/25 required right AKA and reintubated following surgery 11/25-12/03. CXR 12/3 Small bilateral pleural effusions and associated bibasilar atelectasis. Pneumonia is not excluded.   Assessment / Plan / Recommendation Clinical Impression  After a 9 day intubation pt has a surprisingly strong and clear voice. Her cough is hard, strong and she denies odonophagia. Demonstrated no observable difficulty in swallow and respiratory reciprocity. No s/s aspiration with multiple sips via straw. She reports chopping meats at home and has upper denture but no lower exhibiting functional oral phase with solid. SLP ordering Dys 3 texture, thin liquids, pills with thin, upright position. No follow up needed at present time.    SLP Visit Diagnosis: Dysphagia, unspecified (R13.10)    Aspiration Risk  Mild aspiration risk    Diet Recommendation  Dysphagia 3 (Mech soft);Thin liquid   Liquid Administration via: Cup;Straw Medication Administration: Whole meds with liquid Supervision: Patient able to self feed;Intermittent supervision to cue for compensatory strategies Compensations: Slow rate;Small sips/bites Postural Changes: Seated upright at 90 degrees    Other  Recommendations Oral Care Recommendations: Oral care  BID   Follow up Recommendations        Frequency and Duration            Prognosis        Swallow Study   General HPI: Robin Snow is a 78 y.o. female with medical history significant of GERD, stroke (2015), paroxysmal atrial fibrillation, OSA,  hypertension, diabetes mellitus type II, and chronic kidney disease stage III.  Hospitalized from 10/29-11/4 with sepsis related to E. coli bacteremia, UTI, and bacterial arthritis right knee. Found to have dislocated right knee prothestic--02/22/19 closed reduction of right knee prosthetic dislocation. Underwent AKA 11/25 required right AKA and reintubated following surgery 11/25-12/03. CXR 12/3 Small bilateral pleural effusions and associated bibasilar atelectasis. Pneumonia is not excluded. Type of Study: Bedside Swallow Evaluation Previous Swallow Assessment: no Diet Prior to this Study: NPO Temperature Spikes Noted: Yes Respiratory Status: Room air History of Recent Intubation: Yes Length of Intubations (days): 9 days Date extubated: 03/09/19 Behavior/Cognition: Alert;Cooperative;Pleasant mood Oral Cavity Assessment: Within Functional Limits Oral Care Completed by SLP: Yes Oral Cavity - Dentition: Dentures, top;Other (Comment)(no lower) Vision: Functional for self-feeding Self-Feeding Abilities: Able to feed self Patient Positioning: Upright in bed Baseline Vocal Quality: Normal Volitional Cough: Strong Volitional Swallow: Able to elicit    Oral/Motor/Sensory Function Overall Oral Motor/Sensory Function: Within functional limits   Ice Chips Ice chips: Not tested   Thin Liquid Thin Liquid: Within functional limits Presentation: Straw;Cup;Self Fed    Nectar Thick Nectar Thick Liquid: Not tested   Honey Thick Honey Thick Liquid: Not tested   Puree Puree: Within functional limits   Solid     Solid: Within functional limits      Houston Siren 03/10/2019,12:34 PM  Orbie Pyo Colvin Caroli.Ed Chief Technology Officer [phone] Office [phone]

## 2019-03-10 NOTE — Progress Notes (Signed)
ANTICOAGULATION CONSULT NOTE  Pharmacy Consult:  Heparin Indication: atrial fibrillation  Allergies  Allergen Reactions  . Aspirin Hives and Swelling    Angioedema   . Rofecoxib Hives  . Hydrocodone Hives    Tolerates oxycodone and tramadol    Vital Signs: Temp: 97.9 F (36.6 C) (12/04 1606) Temp Source: Oral (12/04 1200) BP: 141/77 (12/04 1800) Pulse Rate: 109 (12/04 1300)  Heparin dosing weight = 83 kg  Labs: Recent Labs    03/08/19 0511  03/09/19 0500 03/09/19 1400 03/10/19 0500 03/10/19 1735  HGB 7.2*  --  7.5*  --  7.6*  --   HCT 23.8*  --  24.5*  --  24.7*  --   PLT 184  --  195  --  211  --   LABPROT 18.1*  --   --   --   --   --   INR 1.5*  --   --   --   --   --   HEPARINUNFRC  --    < >  --  0.51 0.26* 0.19*  CREATININE 0.73  --  0.86  --  0.81  --    < > = values in this interval not displayed.    Estimated Creatinine Clearance: 67.7 mL/min (by C-G formula based on SCr of 0.81 mg/dL).  Assessment: 78 yr old female presented with right knee pain and swelling along with boils/blisters on skin.  Found to have E.coli bacteremia with AV endocarditis, as well as abscess extending to hip that will eventually need hip disarticulation.  Hospital stay has been complicated by anemia and bleeding S/P recent AKA.  Patient has a history of afib on Coumadin PTA and Pharmacy consulted to dose IV heparin while Coumadin remains on hold.  Heparin level ~10.5 hrs after heparin infusion was increased to 1000 units/hr was 0.19 uits/ml, which is below the goal range for this patient. H/H, platelets stable. Per RN, no issues with IV or bleeding reported.  Goal of Therapy:  Heparin level 0.3 - 0.5 units/ml Monitor platelets by anticoagulation protocol: Yes   Plan:  Increase heparin infusion to 1100 units/hr Check 8-hr heparin level Monitor daily heparin level, CBC Monitor for signs/symptoms of bleeding  Gillermina Hu, PharmD, BCPS, [facility] Clinical Pharmacist  03/10/2019,  7:11 PM

## 2019-03-11 DIAGNOSIS — B9620 Unspecified Escherichia coli [E. coli] as the cause of diseases classified elsewhere: Secondary | ICD-10-CM | POA: Diagnosis not present

## 2019-03-11 DIAGNOSIS — R7881 Bacteremia: Secondary | ICD-10-CM | POA: Diagnosis not present

## 2019-03-11 LAB — BASIC METABOLIC PANEL
Anion gap: 6 (ref 5–15)
BUN: 17 mg/dL (ref 8–23)
CO2: 29 mmol/L (ref 22–32)
Calcium: 7.8 mg/dL — ABNORMAL LOW (ref 8.9–10.3)
Chloride: 108 mmol/L (ref 98–111)
Creatinine, Ser: 0.85 mg/dL (ref 0.44–1.00)
GFR calc Af Amer: >60 mL/min (ref >60)
GFR calc non Af Amer: >60 mL/min (ref >60)
Glucose, Bld: 121 mg/dL — ABNORMAL HIGH (ref 70–99)
Potassium: 4 mmol/L (ref 3.5–5.1)
Sodium: 143 mmol/L (ref 135–145)

## 2019-03-11 LAB — CBC WITH DIFFERENTIAL/PLATELET
Abs Immature Granulocytes: 0.16 10*3/uL — ABNORMAL HIGH (ref 0.00–0.07)
Abs Immature Granulocytes: 0.17 10*3/uL — ABNORMAL HIGH (ref 0.00–0.07)
Basophils Absolute: 0 10*3/uL (ref 0.0–0.1)
Basophils Absolute: 0 10*3/uL (ref 0.0–0.1)
Basophils Relative: 0 %
Basophils Relative: 0 %
Eosinophils Absolute: 0 10*3/uL (ref 0.0–0.5)
Eosinophils Absolute: 0 10*3/uL (ref 0.0–0.5)
Eosinophils Relative: 0 %
Eosinophils Relative: 0 %
HCT: 19.4 % — ABNORMAL LOW (ref 36.0–46.0)
HCT: 18.9 % — ABNORMAL LOW (ref 36.0–46.0)
Hemoglobin: 6 g/dL — CL (ref 12.0–15.0)
Hemoglobin: 5.8 g/dL — CL (ref 12.0–15.0)
Immature Granulocytes: 1 %
Immature Granulocytes: 1 %
Lymphocytes Relative: 20 %
Lymphocytes Relative: 21 %
Lymphs Abs: 3.1 10*3/uL (ref 0.7–4.0)
Lymphs Abs: 3.4 10*3/uL (ref 0.7–4.0)
MCH: 30.2 pg (ref 26.0–34.0)
MCH: 30.4 pg (ref 26.0–34.0)
MCHC: 30.9 g/dL (ref 30.0–36.0)
MCHC: 30.7 g/dL (ref 30.0–36.0)
MCV: 97.5 fL (ref 80.0–100.0)
MCV: 99 fL (ref 80.0–100.0)
Monocytes Absolute: 1.1 10*3/uL — ABNORMAL HIGH (ref 0.1–1.0)
Monocytes Absolute: 1.2 10*3/uL — ABNORMAL HIGH (ref 0.1–1.0)
Monocytes Relative: 7 %
Monocytes Relative: 8 %
Neutro Abs: 10.9 10*3/uL — ABNORMAL HIGH (ref 1.7–7.7)
Neutro Abs: 11.4 10*3/uL — ABNORMAL HIGH (ref 1.7–7.7)
Neutrophils Relative %: 72 %
Neutrophils Relative %: 70 %
Platelets: 199 10*3/uL (ref 150–400)
Platelets: 198 10*3/uL (ref 150–400)
RBC: 1.99 MIL/uL — ABNORMAL LOW (ref 3.87–5.11)
RBC: 1.91 MIL/uL — ABNORMAL LOW (ref 3.87–5.11)
RDW: 17.6 % — ABNORMAL HIGH (ref 11.5–15.5)
RDW: 17.6 % — ABNORMAL HIGH (ref 11.5–15.5)
WBC: 15.4 10*3/uL — ABNORMAL HIGH (ref 4.0–10.5)
WBC: 16.3 10*3/uL — ABNORMAL HIGH (ref 4.0–10.5)
nRBC: 0.3 % — ABNORMAL HIGH (ref 0.0–0.2)
nRBC: 0.2 % (ref 0.0–0.2)

## 2019-03-11 LAB — GLUCOSE, CAPILLARY
Glucose-Capillary: 112 mg/dL — ABNORMAL HIGH (ref 70–99)
Glucose-Capillary: 107 mg/dL — ABNORMAL HIGH (ref 70–99)
Glucose-Capillary: 96 mg/dL (ref 70–99)
Glucose-Capillary: 117 mg/dL — ABNORMAL HIGH (ref 70–99)
Glucose-Capillary: 119 mg/dL — ABNORMAL HIGH (ref 70–99)
Glucose-Capillary: 115 mg/dL — ABNORMAL HIGH (ref 70–99)

## 2019-03-11 LAB — PREPARE RBC (CROSSMATCH)

## 2019-03-11 LAB — HEMOGLOBIN AND HEMATOCRIT, BLOOD
HCT: 23.6 % — ABNORMAL LOW (ref 36.0–46.0)
Hemoglobin: 7.6 g/dL — ABNORMAL LOW (ref 12.0–15.0)

## 2019-03-11 LAB — HEPARIN LEVEL (UNFRACTIONATED): Heparin Unfractionated: 0.28 IU/mL — ABNORMAL LOW (ref 0.30–0.70)

## 2019-03-11 MED ORDER — SODIUM CHLORIDE 0.9% IV SOLUTION
Freq: Once | INTRAVENOUS | Status: AC
  Administered 2019-03-11: 12:00:00 via INTRAVENOUS

## 2019-03-11 MED ORDER — AMIODARONE HCL IN DEXTROSE 360-4.14 MG/200ML-% IV SOLN
30.0000 mg/h | INTRAVENOUS | Status: DC
  Administered 2019-03-12 – 2019-03-13 (×3): 30 mg/h via INTRAVENOUS
  Filled 2019-03-11 (×4): qty 200

## 2019-03-11 MED ORDER — AMIODARONE LOAD VIA INFUSION
150.0000 mg | Freq: Once | INTRAVENOUS | Status: AC
  Administered 2019-03-11: 150 mg via INTRAVENOUS
  Filled 2019-03-11: qty 83.34

## 2019-03-11 MED ORDER — AMIODARONE HCL IN DEXTROSE 360-4.14 MG/200ML-% IV SOLN
60.0000 mg/h | INTRAVENOUS | Status: DC
  Administered 2019-03-11 (×2): 60 mg/h via INTRAVENOUS
  Filled 2019-03-11: qty 200

## 2019-03-11 NOTE — Progress Notes (Signed)
CRITICAL VALUE ALERT  Critical Value:  6.0 hgb, Heparin gtt stopped  Date & Time Notied:  1638 12/5  Provider Notified: Youlanda Mighty RN  Orders Received/Actions taken: Repeat labs if needed after BMP results per Dr. Genevive Bi

## 2019-03-11 NOTE — Plan of Care (Signed)
  Problem: Clinical Measurements: Goal: Respiratory complications will improve Outcome: Progressing   

## 2019-03-11 NOTE — Progress Notes (Signed)
NAME:  Robin Snow, MRN:  [id number], DOB:  04/21/1940, LOS: 76 ADMISSION DATE:  02/22/2019, CONSULTATION DATE: 11/25 REFERRING MD: Dr. Nevada Crane, CHIEF COMPLAINT: Postop respiratory failure, s/p BKA for septic knee prosthesis  Brief History   78 yo F with R knee replacement, s/p revision in 2018, presenting with prosthetic dislocation and abscess. S/p closed reduction, drainage 11/18. Treating for e.coli bacteremia. Underwent R AKA 11/25 and was intubated after procedure for respiratory failure.  Past Medical History  has Permanent atrial fibrillation ([place]); GERD (gastroesophageal reflux disease); Embolic stroke ([place]); Right hemiparesis ([place]); Chronic venous insufficiency; Essential hypertension; HLD (hyperlipidemia); Diabetes mellitus with complication ([place]); Anemia; CKD (chronic kidney disease) stage 3, GFR 30-59 ml/min; Chronic pain syndrome; Painful total knee replacement ([place]); Physical deconditioning; Sinus bradycardia; Orthostatic hypotension; Supratherapeutic INR; Chronic diastolic CHF (congestive heart failure) ([place]); Sinus pause: 4.02-4.80sec per telemetry 09/08/2016; Tachy-brady syndrome ([place]); Long term (current) use of anticoagulants; Obstructive sleep apnea; Urinary frequency; Pre-op evaluation; Warm antibody hemolytic anemia; Foot pain; Sepsis ([place]); Atrial fibrillation with RVR ([place]); Acute cystitis with hematuria; Closed dislocation of right knee; Effusion of right knee joint; AF (paroxysmal atrial fibrillation) ([place]); Bacteremia due to Escherichia coli; Iron deficiency anemia due to chronic blood loss; Dislocation of right knee; Osteomyelitis of right knee region [facility]); Severe protein-calorie malnutrition ([place]); Abscess of thigh; Chronic osteomyelitis of right femur with draining sinus ([place]); Infection of total right knee replacement ([place]); Pressure injury of skin; and Acute respiratory failure ([place]) on their problem list.  [facility] Events   11/18 admitted. R knee closed  reduction, drainage 11/21 ongoing tx for e coli bacteremia 11/23 PT rec dc to SNF when appropriate, family refuses. ID consulted for abx duration 11/24 ID recommend TEE, cardiology consulted. 11/25 R AKA for septic abscess and osteomyelitis . Reintubated following surgery.To ICU  11/26 hypotensive. Got transfused (total of 4 units); pressor dependent. Surgery noting she would still need hip disarticulation once stable 11/27: hgb down from 7.5 to 4.8 overnight. Still sig output from surgical site drain.3 units PRBC. 2 units FFP  hemoglobin has been stable 12/1 Off pressors, weaning on PSV 12/2: IV heparin started 12/3 Extubated  12/5 hemoglobin down 2 g.  No overt source of bleeding noted.-Stopped transfusing Consults:  ID Ortho PCCM Cardiology  Procedures:  11/18 > closed reduction of R knee prosthetic, drainage 11/25 > R AKA 11/25 ETT > 12/3  Significant Diagnostic Tests:  11/18 R knee plain film > Large R knee effusion, anterior dislocation of femoral component to tibial component   11/27 CT abd/pelvis>  1. No evidence of retroperitoneal hemorrhage. 2. Mild colonic ileus.  No evidence of bowel obstruction. 3. Moderate diffuse body wall edema. 4. Moderate left renal parenchymal atrophy.  12/2 CXR > Stable cardiomegaly and mild edema. Stable bibasilar airspace disease likely reflects atelectasis. Infection is not excluded. Stable small bilateral pleural effusions.  Micro Data:  11/18 SARS Cov2 > neg  11/18 synovial fluid > Moderate E.Coli 11/18 BCx > e.coli 11/23 and 11/25 BCx > neg   Antimicrobials:  Rocephin 11/19 > (6 wk course)   Interim history/subjective:  Hemoglobin dropped, heparin stopped, getting blood Objective   Blood pressure 94/63, pulse (Abnormal) 109, temperature 98.4 F (36.9 C), temperature source Oral, resp. rate 12, height 5\' 7"  (1.702 m), weight 97.8 kg, SpO2 100 %.        Intake/Output Summary (Last 24 hours) at 03/11/2019 1020 Last data filed  at 03/11/2019 0900 Gross per 24 hour  Intake 464.62 ml  Output 1675 ml  Net -1210.38 ml   Filed Weights   03/08/19 0500 03/09/19 0500 03/10/19 0500  Weight: 106.4 kg 97.1 kg 97.8 kg    Examination: . General chronically ill 78 year old black female resting in bed she is in no acute distress currently HEENT normocephalic atraumatic no jugular venous distention Pulmonary: Clear to auscultation diminished bases Cardiac: Regular irregular, tachycardic on telemetry.  No murmur appreciated. Abdomen: Soft nontender Extremities: Right above-the-knee amputation stump with wound VAC in place no bloody discharge noted. Neuro: Awake oriented no focal deficits GU: Clear yellow.  Resolved problems:  Circulatory shock  Hypophosphatemia   Assessment & Plan:   Postoperative respiratory failure  -In the setting of E-coli bacteremia, recent AKA, and significant deconditioning  -Extubated 12/3 Plan Pulse ox  IS pulm hygiene   Endocarditis E.Colie bacteriemia  -TEE completed 12/2 with evidence of  Mobile mass and thickening of aortic valve suspicious for aortic valvular vegetation  Plan ID favoring medical rx; continuing rocephin  Awaiting ortho re-eval to decide on right hip disarticulation    Severe acute blood loss anemia  -Stabilized post transfusion. CT neg for hematoma.  -hgb has dropped from 7.8 down to 5.8 in last 24 hours. No clear blood loss identified But was on heparin since 12/2 Plan Stop heparin Transfuse 2 units  Am cbc  Hypotension w/ what appears to be hypovolemic/hemorrhagic shock Plan Transfuse  May need low dose neo   Chronic kidney disease stage III  -Baselne creatinine between 0.9-1.1   plan continue to trend chemistry Avoid hypotension Strict intake and output  Atrial fibrillation  Sick sinus syndrome s/p pacemaker  -Baseline anticoagulated with coumadin  plan continue telemetry monitoring continue diltiazem (with hold orders for hypotension)  Holding anticoagulation given hemoglobin drop  Protein calorie malnutrition  plan diet as tolerated Encourage supplemental protein drinks  Sever deconditioning / Debility  -Family declined skilled nursing facility   plan will need extensive occupational and physical therapy Disposition will be difficult given family has declined skilled nursing facility, she will need extensive home health support   Intermittent fluid and electrolyte imbalance Plan Trend and replace as indicated   Best practice:  Diet: Tube Feeds Pain/Anxiety/Delirium protocol (if indicated): Precedex, fentanyl VAP protocol (if indicated): In place DVT prophylaxis: SCD GI prophylaxis: Protonix Glucose control: SSI Mobility: Bedrest Code Status: Full code Family Communication: Daughter [name] called and updated regarding progress 12/2 Disposition: ICU  Critical care: 35 minutes   Simonne Martinet ACNP-BC [facility] Pulmonary/Critical Care Pager # [phone] OR # [phone] if no answer

## 2019-03-12 DIAGNOSIS — R7881 Bacteremia: Secondary | ICD-10-CM | POA: Diagnosis not present

## 2019-03-12 DIAGNOSIS — B9620 Unspecified Escherichia coli [E. coli] as the cause of diseases classified elsewhere: Secondary | ICD-10-CM | POA: Diagnosis not present

## 2019-03-12 LAB — CBC WITH DIFFERENTIAL/PLATELET
Abs Immature Granulocytes: 0.19 10*3/uL — ABNORMAL HIGH (ref 0.00–0.07)
Basophils Absolute: 0 10*3/uL (ref 0.0–0.1)
Basophils Relative: 0 %
Eosinophils Absolute: 0.1 10*3/uL (ref 0.0–0.5)
Eosinophils Relative: 1 %
HCT: 22.1 % — ABNORMAL LOW (ref 36.0–46.0)
Hemoglobin: 7.1 g/dL — ABNORMAL LOW (ref 12.0–15.0)
Immature Granulocytes: 1 %
Lymphocytes Relative: 23 %
Lymphs Abs: 3.1 10*3/uL (ref 0.7–4.0)
MCH: 30.2 pg (ref 26.0–34.0)
MCHC: 32.1 g/dL (ref 30.0–36.0)
MCV: 94 fL (ref 80.0–100.0)
Monocytes Absolute: 1.3 10*3/uL — ABNORMAL HIGH (ref 0.1–1.0)
Monocytes Relative: 10 %
Neutro Abs: 8.7 10*3/uL — ABNORMAL HIGH (ref 1.7–7.7)
Neutrophils Relative %: 65 %
Platelets: 148 10*3/uL — ABNORMAL LOW (ref 150–400)
RBC: 2.35 MIL/uL — ABNORMAL LOW (ref 3.87–5.11)
RDW: 19.3 % — ABNORMAL HIGH (ref 11.5–15.5)
WBC: 13.3 10*3/uL — ABNORMAL HIGH (ref 4.0–10.5)
nRBC: 0.3 % — ABNORMAL HIGH (ref 0.0–0.2)

## 2019-03-12 LAB — BASIC METABOLIC PANEL
Anion gap: 8 (ref 5–15)
BUN: 21 mg/dL (ref 8–23)
CO2: 27 mmol/L (ref 22–32)
Calcium: 7.7 mg/dL — ABNORMAL LOW (ref 8.9–10.3)
Chloride: 102 mmol/L (ref 98–111)
Creatinine, Ser: 0.9 mg/dL (ref 0.44–1.00)
GFR calc Af Amer: >60 mL/min (ref >60)
GFR calc non Af Amer: >60 mL/min (ref >60)
Glucose, Bld: 141 mg/dL — ABNORMAL HIGH (ref 70–99)
Potassium: 4.4 mmol/L (ref 3.5–5.1)
Sodium: 137 mmol/L (ref 135–145)

## 2019-03-12 LAB — GLUCOSE, CAPILLARY
Glucose-Capillary: 129 mg/dL — ABNORMAL HIGH (ref 70–99)
Glucose-Capillary: 96 mg/dL (ref 70–99)
Glucose-Capillary: 112 mg/dL — ABNORMAL HIGH (ref 70–99)
Glucose-Capillary: 99 mg/dL (ref 70–99)
Glucose-Capillary: 94 mg/dL (ref 70–99)
Glucose-Capillary: 114 mg/dL — ABNORMAL HIGH (ref 70–99)

## 2019-03-12 NOTE — Progress Notes (Signed)
NAME:  Robin Snow, MRN:  [id number], DOB:  [birth year], LOS: 22 ADMISSION DATE:  02/22/2019, CONSULTATION DATE: 11/25 REFERRING MD: Dr. Nevada Crane, CHIEF COMPLAINT: Postop respiratory failure, s/p BKA for septic knee prosthesis  Brief History   78 yo F with R knee replacement, s/p revision in 2018, presenting with prosthetic dislocation and abscess. S/p closed reduction, drainage 11/18. Treating for e.coli bacteremia. Underwent R AKA 11/25 and was intubated after procedure for respiratory failure.  Past Medical History  has Permanent atrial fibrillation ([place]); GERD (gastroesophageal reflux disease); Embolic stroke ([place]); Right hemiparesis ([place]); Chronic venous insufficiency; Essential hypertension; HLD (hyperlipidemia); Diabetes mellitus with complication ([place]); Anemia; CKD (chronic kidney disease) stage 3, GFR 30-59 ml/min; Chronic pain syndrome; Painful total knee replacement ([place]); Physical deconditioning; Sinus bradycardia; Orthostatic hypotension; Supratherapeutic INR; Chronic diastolic CHF (congestive heart failure) ([place]); Sinus pause: 4.02-4.80sec per telemetry 09/08/2016; Tachy-brady syndrome ([place]); Long term (current) use of anticoagulants; Obstructive sleep apnea; Urinary frequency; Pre-op evaluation; Warm antibody hemolytic anemia; Foot pain; Sepsis ([place]); Atrial fibrillation with RVR ([place]); Acute cystitis with hematuria; Closed dislocation of right knee; Effusion of right knee joint; AF (paroxysmal atrial fibrillation) ([place]); Bacteremia due to Escherichia coli; Iron deficiency anemia due to chronic blood loss; Dislocation of right knee; Osteomyelitis of right knee region [facility]); Severe protein-calorie malnutrition ([place]); Abscess of thigh; Chronic osteomyelitis of right femur with draining sinus ([place]); Infection of total right knee replacement ([place]); Pressure injury of skin; and Acute respiratory failure ([place]) on their problem list.  [facility] Events   11/18 admitted. R knee closed  reduction, drainage 11/21 ongoing tx for e coli bacteremia 11/23 PT rec dc to SNF when appropriate, family refuses. ID consulted for abx duration 11/24 ID recommend TEE, cardiology consulted. 11/25 R AKA for septic abscess and osteomyelitis . Reintubated following surgery.To ICU  11/26 hypotensive. Got transfused (total of 4 units); pressor dependent. Surgery noting she would still need hip disarticulation once stable 11/27: hgb down from 7.5 to 4.8 overnight. Still sig output from surgical site drain.3 units PRBC. 2 units FFP  hemoglobin has been stable 12/1 Off pressors, weaning on PSV 12/2: IV heparin started 12/3 Extubated  12/5 hemoglobin down 2 g.  No overt source of bleeding noted.->got blood. Amiodarone gtt started.  Consults:  ID Ortho PCCM Cardiology  Procedures:  11/18 > closed reduction of R knee prosthetic, drainage 11/25 > R AKA 11/25 ETT > 12/3  Significant Diagnostic Tests:  11/18 R knee plain film > Large R knee effusion, anterior dislocation of femoral component to tibial component   11/27 CT abd/pelvis>  1. No evidence of retroperitoneal hemorrhage. 2. Mild colonic ileus.  No evidence of bowel obstruction. 3. Moderate diffuse body wall edema. 4. Moderate left renal parenchymal atrophy.  12/2 CXR > Stable cardiomegaly and mild edema. Stable bibasilar airspace disease likely reflects atelectasis. Infection is not excluded. Stable small bilateral pleural effusions.  Micro Data:  11/18 SARS Cov2 > neg  11/18 synovial fluid > Moderate E.Coli 11/18 BCx > e.coli 11/23 and 11/25 BCx > neg   Antimicrobials:  Rocephin 11/19 > (6 wk course)   Interim history/subjective:  Her night was uneventful. Objective   Blood pressure 100/62, pulse (Abnormal) 102, temperature 98 F (36.7 C), temperature source Oral, resp. rate 17, height 5\' 7"  (1.702 m), weight 102.4 kg, SpO2 97 %.        Intake/Output Summary (Last 24 hours) at 03/12/2019 0749 Last data filed at  03/12/2019 0000 Gross per 24 hour  Intake  1731.71 ml  Output 650 ml  Net 1081.71 ml   Filed Weights   03/09/19 0500 03/10/19 0500 03/12/19 0449  Weight: 97.1 kg 97.8 kg 102.4 kg    Examination: . General this is a delightful 78 year old black female who is resting comfortably in bed and in no acute distress this morning HEENT normocephalic atraumatic no jugular venous distention is appreciated mucous membranes are moist Pulmonary: Clear to auscultation without accessory use she remains on room air with pulse oximetry in the 95 to 100%  Cardiac: Regular irregular with atrial fibrillation appreciated on telemetry heart rate in the 100s Abdomen: Soft, not tender, tolerating diet. GU: Pure wick catheter in place, clear yellow urine Neuro: Awake, oriented, equal upper extremity strength, lower extremity strength is quite weak.  She is oriented x3 Extremities: Warm and dry, the right AKA site wound VAC dressing is intact and draining brownish colored discharge.  There is no clear evidence of bleeding from this.   Resolved problems:  Circulatory shock (recurrent; most recent 12/5 resolved w/ blood) Hypophosphatemia   Assessment & Plan:   Postoperative respiratory failure  -In the setting of E-coli bacteremia, recent AKA, and significant deconditioning  -Extubated 12/3 Plan Continue incentive spirometry and pulse oximetry  Endocarditis E.Colie bacteriemia in the context of infected knee hardware.  Now status post right AKA -TEE completed 12/2 with evidence of  Mobile mass and thickening of aortic valve suspicious for aortic valvular vegetation  Plan Infectious diseases favoring medical therapy, he is to receive 6 weeks of ceftriaxone, they have not advised removal of pacemaker We are awaiting orthopedic return, to decide on hip disarticulation   Acute and subacute blood loss anemia  blood loss identified But was on heparin since 12/2; got 2 units of blood 12/5 hgb was up to 7.6 from  5.8 Plan Holding heparin Trending CBC, a.m. CBC still pending Transfuse for hemoglobin less than 7.  Chronic kidney disease stage III  -Baselne creatinine between 0.9-1.1   plan Trend chemistry  Avoid nephrotoxins   Atrial fibrillation  Sick sinus syndrome s/p pacemaker  -Baseline anticoagulated with coumadin  plan Cont amiodarone  Cont tele No AC given bleeding CCB stopped.   Protein calorie malnutrition  plan Encourage diet.  Cont supplemental protein drinks  Sever deconditioning / Debility   -Family declined skilled nursing facility   plan She will need extensive PT/OT. Family hopes to bring her home; looking for CIR    Intermittent fluid and electrolyte imbalance Plan Trend and replace as indicated    Best practice:  Diet: Tube Feeds Pain/Anxiety/Delirium protocol (if indicated): Precedex, fentanyl VAP protocol (if indicated): In place DVT prophylaxis: SCD GI prophylaxis: Protonix Glucose control: SSI Mobility: Bedrest Code Status: Full code Family Communication: Daughter [name] called and updated regarding progress 12/2 Disposition: ICU  Critical care time 32 minutes    Simonne Martinet ACNP-BC [facility] Pulmonary/Critical Care Pager # [phone] OR # [phone] if no answer

## 2019-03-12 NOTE — Plan of Care (Signed)
  Problem: Pain Managment: Goal: General experience of comfort will improve Outcome: Progressing   

## 2019-03-13 DIAGNOSIS — R7881 Bacteremia: Secondary | ICD-10-CM | POA: Diagnosis not present

## 2019-03-13 DIAGNOSIS — B9620 Unspecified Escherichia coli [E. coli] as the cause of diseases classified elsewhere: Secondary | ICD-10-CM | POA: Diagnosis not present

## 2019-03-13 LAB — GLUCOSE, CAPILLARY
Glucose-Capillary: 88 mg/dL (ref 70–99)
Glucose-Capillary: 88 mg/dL (ref 70–99)
Glucose-Capillary: 113 mg/dL — ABNORMAL HIGH (ref 70–99)
Glucose-Capillary: 114 mg/dL — ABNORMAL HIGH (ref 70–99)
Glucose-Capillary: 122 mg/dL — ABNORMAL HIGH (ref 70–99)
Glucose-Capillary: 101 mg/dL — ABNORMAL HIGH (ref 70–99)

## 2019-03-13 LAB — CBC WITH DIFFERENTIAL/PLATELET
Abs Immature Granulocytes: 0.23 10*3/uL — ABNORMAL HIGH (ref 0.00–0.07)
Basophils Absolute: 0 10*3/uL (ref 0.0–0.1)
Basophils Relative: 0 %
Eosinophils Absolute: 0.2 10*3/uL (ref 0.0–0.5)
Eosinophils Relative: 2 %
HCT: 22.5 % — ABNORMAL LOW (ref 36.0–46.0)
Hemoglobin: 7 g/dL — ABNORMAL LOW (ref 12.0–15.0)
Immature Granulocytes: 3 %
Lymphocytes Relative: 28 %
Lymphs Abs: 2.6 10*3/uL (ref 0.7–4.0)
MCH: 30.2 pg (ref 26.0–34.0)
MCHC: 31.1 g/dL (ref 30.0–36.0)
MCV: 97 fL (ref 80.0–100.0)
Monocytes Absolute: 1 10*3/uL (ref 0.1–1.0)
Monocytes Relative: 10 %
Neutro Abs: 5.4 10*3/uL (ref 1.7–7.7)
Neutrophils Relative %: 57 %
Platelets: 175 10*3/uL (ref 150–400)
RBC: 2.32 MIL/uL — ABNORMAL LOW (ref 3.87–5.11)
RDW: 19.3 % — ABNORMAL HIGH (ref 11.5–15.5)
WBC: 9.3 10*3/uL (ref 4.0–10.5)
nRBC: 0.8 % — ABNORMAL HIGH (ref 0.0–0.2)

## 2019-03-13 LAB — COMPREHENSIVE METABOLIC PANEL
ALT: 19 U/L (ref 0–44)
AST: 30 U/L (ref 15–41)
Albumin: 1.8 g/dL — ABNORMAL LOW (ref 3.5–5.0)
Alkaline Phosphatase: 70 U/L (ref 38–126)
Anion gap: 9 (ref 5–15)
BUN: 16 mg/dL (ref 8–23)
CO2: 26 mmol/L (ref 22–32)
Calcium: 7.9 mg/dL — ABNORMAL LOW (ref 8.9–10.3)
Chloride: 104 mmol/L (ref 98–111)
Creatinine, Ser: 0.93 mg/dL (ref 0.44–1.00)
GFR calc Af Amer: >60 mL/min (ref >60)
GFR calc non Af Amer: 58 mL/min — ABNORMAL LOW (ref >60)
Glucose, Bld: 97 mg/dL (ref 70–99)
Potassium: 4.9 mmol/L (ref 3.5–5.1)
Sodium: 139 mmol/L (ref 135–145)
Total Bilirubin: 0.6 mg/dL (ref 0.3–1.2)
Total Protein: 4.8 g/dL — ABNORMAL LOW (ref 6.5–8.1)

## 2019-03-13 MED ORDER — SODIUM CHLORIDE 0.9% FLUSH
10.0000 mL | INTRAVENOUS | Status: DC | PRN
  Administered 2019-03-22 – 2019-03-23 (×3): 10 mL

## 2019-03-13 MED ORDER — SODIUM CHLORIDE 0.9 % IV SOLN
INTRAVENOUS | Status: DC | PRN
  Administered 2019-03-13: 1000 mL via INTRAVENOUS

## 2019-03-13 MED ORDER — ALTEPLASE 2 MG IJ SOLR
2.0000 mg | Freq: Once | INTRAMUSCULAR | Status: AC
  Administered 2019-03-13: 2 mg
  Filled 2019-03-13: qty 2

## 2019-03-13 MED ORDER — SODIUM CHLORIDE 0.9% FLUSH
10.0000 mL | Freq: Two times a day (BID) | INTRAVENOUS | Status: DC
  Administered 2019-03-13 – 2019-03-29 (×14): 10 mL

## 2019-03-13 MED ORDER — AMIODARONE HCL 200 MG PO TABS
200.0000 mg | ORAL_TABLET | Freq: Every day | ORAL | Status: DC
  Administered 2019-03-13 – 2019-03-29 (×17): 200 mg via ORAL
  Filled 2019-03-13 (×17): qty 1

## 2019-03-13 NOTE — Progress Notes (Signed)
Physical Therapy Treatment Patient Details Name: Robin Snow MRN: [id number] DOB: [birth year] Today's Date: 03/13/2019    History of Present Illness 78  y/o female with history of atrial fibrillation, HTN, DM,CKD III had a fall at home when patient was trying to get out of the bed. Hx of R knee pain and swelling for the last few months. Found with sepsis secondary to E. coli bacteremia. Hospitalized from 10/29-11/4 with sepsis related to E. coli bacteremia, UTI, and bacterial arthritis right knee. Found to have dislocated right knee prothestic--02/22/19 s/p Closed reduction of right knee prosthetic dislocation. 11/25 underwent R AKA intubated with respiratory failure extubation 03/09/19    PT Comments    Pt seen in conjunction with OT to progress OOB mobility. Pt able to follow commands and participate in therapy session however with delayed response time. Focused on trunk control and sitting EOB endurance as well as lateral scoot transfer to recliner. With maxAx2 and max directional verbal cues pt able to complete slide board transfer and able to assist via pushing with R UE. Instructed RN staff to use maxi sky to transfer pt back to bed. Acute PT to cont to follow to advance bed and OOB mobility.   Follow Up Recommendations  CIR     Equipment Recommendations    TBD at next venue   Recommendations for Other Services       Precautions / Restrictions Precautions Precautions: Fall Precaution Comments: wound vac R AKA Knee Immobilizer - Right: On at all times Restrictions Weight Bearing Restrictions: No RLE Weight Bearing: Non weight bearing    Mobility  Bed Mobility Overal bed mobility: Needs Assistance Bed Mobility: Supine to Sit     Supine to sit: Max assist;+2 for physical assistance     General bed mobility comments: with max verbal cues, pt reached for PTs arm to pull self forward and initiated L LE movement to EOB, maxAx2 required for trunk elevation and to pvt to EOB  and maintain upright position  Transfers Overall transfer level: Needs assistance Equipment used: Sliding board Transfers: Lateral/Scoot Transfers          Lateral/Scoot Transfers: Max assist;With slide board General transfer comment: pt maxA for board placement, OT assisted with trunk management and R lateral lean for PT to place slide board, pt maxAx2 for lateral scoot across board into drop arm recliner  Ambulation/Gait                 Stairs             Wheelchair Mobility    Modified Rankin (Stroke Patients Only)       Balance Overall balance assessment: Needs assistance Sitting-balance support: Bilateral upper extremity supported;Feet supported Sitting balance-Leahy Scale: Poor Sitting balance - Comments: pt required mod to maxA to maintain EOB balance, worked on pulling self forward to midline as pt rests in posterior lean, worked on lateral sidebending to L/R (going down on elbows)       Standing balance comment: unable to stand                            Cognition Arousal/Alertness: Awake/alert Behavior During Therapy: Flat affect Overall Cognitive Status: Impaired/Different from baseline Area of Impairment: Problem solving                             Problem Solving: Slow processing;Decreased initiation;Requires verbal cues;Difficulty sequencing;Requires  tactile cues General Comments: requests to do things on her own but then doesn't physically initiate, pt requiring max verbal and tactile cues to complete task      Exercises      General Comments General comments (skin integrity, edema, etc.): L LE swelling, R residual limb with wound vac      Pertinent Vitals/Pain Pain Assessment: Faces Faces Pain Scale: Hurts whole lot Pain Location: R AKA Pain Descriptors / Indicators: Grimacing;Operative site guarding;Moaning;Guarding Pain Intervention(s): Limited activity within patient's tolerance    Home Living                       Prior Function            PT Goals (current goals can now be found in the care plan section) Progress towards PT goals: Progressing toward goals    Frequency    Min 3X/week      PT Plan Current plan remains appropriate    Co-evaluation PT/OT/SLP Co-Evaluation/Treatment: Yes Reason for Co-Treatment: Complexity of the patient's impairments (multi-system involvement) PT goals addressed during session: Mobility/safety with mobility        AM-PAC PT "6 Clicks" Mobility   Outcome Measure  Help needed turning from your back to your side while in a flat bed without using bedrails?: Total Help needed moving from lying on your back to sitting on the side of a flat bed without using bedrails?: Total Help needed moving to and from a bed to a chair (including a wheelchair)?: Total Help needed standing up from a chair using your arms (e.g., wheelchair or bedside chair)?: Total Help needed to walk in hospital room?: Total Help needed climbing 3-5 steps with a railing? : Total 6 Click Score: 6    End of Session Equipment Utilized During Treatment: Gait belt Activity Tolerance: Patient tolerated treatment well Patient left: in chair;with call bell/phone within reach;with chair alarm set Nurse Communication: Mobility status PT Visit Diagnosis: Pain;Difficulty in walking, not elsewhere classified (R26.2);Other abnormalities of gait and mobility (R26.89);History of falling (Z91.81);Muscle weakness (generalized) (M62.81) Pain - Right/Left: Right Pain - part of body: Knee     Time: 1829-9371 PT Time Calculation (min) (ACUTE ONLY): 26 min  Charges:  $Therapeutic Activity: 8-22 mins                     Lewis Shock, PT, DPT Acute Rehabilitation Services Pager #: [phone] Office #: [phone]    [name] 03/13/2019, 10:06 AM

## 2019-03-13 NOTE — Progress Notes (Signed)
NAME:  Robin Snow, MRN:  [id number], DOB:  November 10, 1940, LOS: 18 ADMISSION DATE:  02/22/2019, CONSULTATION DATE: 11/25 REFERRING MD: Dr. Margo Aye, CHIEF COMPLAINT: Postop respiratory failure, s/p BKA for septic knee prosthesis  Brief History   78 yo F with R knee replacement, s/p revision in 2018, presenting with prosthetic dislocation and abscess. S/p closed reduction, drainage 11/18. Treating for e.coli bacteremia. Underwent R AKA 11/25 and was intubated after procedure for respiratory failure.  Past Medical History  has Permanent atrial fibrillation (HCC); GERD (gastroesophageal reflux disease); Embolic stroke (HCC); Right hemiparesis (HCC); Chronic venous insufficiency; Essential hypertension; HLD (hyperlipidemia); Diabetes mellitus with complication (HCC); Anemia; CKD (chronic kidney disease) stage 3, GFR 30-59 ml/min; Chronic pain syndrome; Painful total knee replacement (HCC); Physical deconditioning; Sinus bradycardia; Orthostatic hypotension; Supratherapeutic INR; Chronic diastolic CHF (congestive heart failure) (HCC); Sinus pause: 4.02-4.80sec per telemetry 09/08/2016; Tachy-brady syndrome (HCC); Long term (current) use of anticoagulants; Obstructive sleep apnea; Urinary frequency; Pre-op evaluation; Warm antibody hemolytic anemia; Foot pain; Sepsis (HCC); Atrial fibrillation with RVR (HCC); Acute cystitis with hematuria; Closed dislocation of right knee; Effusion of right knee joint; AF (paroxysmal atrial fibrillation) (HCC); Bacteremia due to Escherichia coli; Iron deficiency anemia due to chronic blood loss; Dislocation of right knee; Osteomyelitis of right knee region [facility]); Severe protein-calorie malnutrition (HCC); Abscess of thigh; Chronic osteomyelitis of right femur with draining sinus (HCC); Infection of total right knee replacement (HCC); Pressure injury of skin; and Acute respiratory failure (HCC) on their problem list.  Significant Hospital Events   11/18 admitted. R knee closed  reduction, drainage 11/21 ongoing tx for e coli bacteremia 11/23 PT rec dc to SNF when appropriate, family refuses. ID consulted for abx duration 11/24 ID recommend TEE, cardiology consulted. 11/25 R AKA for septic abscess and osteomyelitis . Reintubated following surgery.To ICU  11/26 hypotensive. Got transfused (total of 4 units); pressor dependent. Surgery noting she would still need hip disarticulation once stable 11/27: hgb down from 7.5 to 4.8 overnight. Still sig output from surgical site drain.3 units PRBC. 2 units FFP  hemoglobin has been stable 12/1 Off pressors, weaning on PSV 12/2: IV heparin started 12/3 Extubated  12/5 hemoglobin down 2 g.  No overt source of bleeding noted.->got blood. Amiodarone gtt started.  Consults:  ID Ortho PCCM Cardiology  Procedures:  11/18 > closed reduction of R knee prosthetic, drainage 11/25 > R AKA 11/25 ETT > 12/3  Significant Diagnostic Tests:  11/18 R knee plain film > Large R knee effusion, anterior dislocation of femoral component to tibial component   11/27 CT abd/pelvis>  1. No evidence of retroperitoneal hemorrhage. 2. Mild colonic ileus.  No evidence of bowel obstruction. 3. Moderate diffuse body wall edema. 4. Moderate left renal parenchymal atrophy.  12/2 CXR > Stable cardiomegaly and mild edema. Stable bibasilar airspace disease likely reflects atelectasis. Infection is not excluded. Stable small bilateral pleural effusions.  Micro Data:  11/18 SARS Cov2 > neg  11/18 synovial fluid > Moderate E.Coli 11/18 BCx > e.coli 11/23 and 11/25 BCx > neg   Antimicrobials:  Rocephin 11/19 > (6 wk course)   Interim history/subjective:  NAEO Remains on amio gtt this morning but is rate controlled at this time  Objective   Blood pressure 117/60, pulse 89, temperature 98.5 F (36.9 C), temperature source Axillary, resp. rate 18, height 5\' 7"  (1.702 m), weight 103 kg, SpO2 98 %.        Intake/Output Summary (Last 24 hours)  at 03/13/2019 8295 Last data  filed at 03/13/2019 0600 Gross per 24 hour  Intake 440.39 ml  Output 600 ml  Net -159.61 ml   Filed Weights   03/10/19 0500 03/12/19 0449 03/13/19 0500  Weight: 97.8 kg 102.4 kg 103 kg    Examination: . General Chronically ill appearing older adult F, reclined in bed NAD eating breakfast HEENT NCAT. Pink mmm. Trachea midline. Anicteric sclera Pulmonary: CTA. Even, unlabored respirations on RA  Cardiac: irregular rhythm, regular rate. s1s2 no rgm. No JVD.  Abdomen: soft, ndnt. + bowel sounds  GU: purewick with clear yellow urine Neuro: AAO x3 following commands.  Extremities: R AKA with wound vac collecting brown tinged output.    Resolved problems:  Circulatory shock (recurrent; most recent 12/5 resolved w/ blood) Hypophosphatemia   Assessment & Plan:   Post-operative respiratory failure requiring mechanical ventilation  -In the setting of E-coli bacteremia, recent AKA, and significant deconditioning  -Extubated 12/3 Plan -Continue IS, encourage t/b/db  -doing well on RA   Endocarditis, E.Coli bacteriemia in the context of infected knee hardware.  Now status post right AKA -TEE completed 12/2 with evidence of  Mobile mass and thickening of aortic valve suspicious for aortic valvular vegetation  Plan ID following and rec 6 weeks rocephin ID does not advise removal of pacemaker Awaiting ortho to return: to decide on possible hip disarticulation in setting of muscle necrosis   Acute and subacute blood loss anemia  Hgb now 7.0 12/7 from 7.1 on 12/6 Plan Hold heparin Transfuse for hgb < 7 or symptomatic anemia Hgb is 7.0 from 7.1 and her hemodynamics are stable (rate controlled a fib, normotensive) I will not transfuse this morning  Ferrous sulfate supplement   Chronic kidney disease stage III  -Baselne creatinine between 0.9-1.1   plan Trend chemistry  Avoid nephrotoxins   Atrial fibrillation  -s/p amio gtt. Now rate controlled a fib  Sick sinus syndrome s/p pacemaker  -Baseline anticoagulated with coumadin  plan Continue tele On home meds, will dc amio gtt and add PO amio No AC given recent bleeding   Protein calorie malnutrition  plan Encourage diet Cont supplemental protein drinks  Severe deconditioning / Debility   -Family declined SNF  plan She will need extensive PT/OT.  Family hopes to bring her home with home health but CIR may be more realistic goal and in better interest of patient   Intermittent fluid and electrolyte imbalance Plan Trend and replace as indicated  K goal > 4 Mag goal > 2  Best practice:  Diet: PO  Pain/Anxiety/Delirium protocol (if indicated): Precedex, fentanyl VAP protocol (if indicated): In place DVT prophylaxis: SCD GI prophylaxis: Protonix Glucose control: monitor Mobility: Bedrest Code Status: Full code Family Communication: Patient updated  Disposition: ICU at present. Will observe off of amio gtt today and if remains stable will likely transfer to SDU and request Hospitalist team to take over care    CRITICAL CARE Performed by: Lanier Clam   Total critical care time: 31 minutes  Critical care time was exclusive of separately billable procedures and treating other patients. Critical care was necessary to treat or prevent imminent or life-threatening deterioration.  Critical care was time spent personally by me on the following activities: development of treatment plan with patient and/or surrogate as well as nursing, discussions with consultants, evaluation of patient's response to treatment, examination of patient, obtaining history from patient or surrogate, ordering and performing treatments and interventions, ordering and review of laboratory studies, ordering and review of radiographic studies,  pulse oximetry and re-evaluation of patient's condition.  Tessie Fass MSN, AGACNP-BC [place] [place] [facility] [phone] If no answer, [phone]  03/13/2019, 8:13 AM

## 2019-03-13 NOTE — Progress Notes (Signed)
Robin Snow for Infectious Disease  Date of Admission:  02/22/2019     Total days of antibiotics 20 Ceftriaxone 11/18 >>         ASSESSMENT:  Robin Snow continues to receive treatment for recurrent E. Coli bacteremia complicating total knee prosthesis with extension up to her hip now s/p AKA on 11/25 and question aortic valve endocarditis. Clinically has remained stable and without fever and improved WBC count. Awaiting re-evaluation by orthopedics to determine need for additional surgery. Will need at least 6 weeks of total IV therapy from her surgical date of 11/25 through 04/12/19 for endocarditis. Will hold off adding flouroquinolone due to risk of prolonged QT and C. Diff. Disposition remains undetermined with rehabilitation services recommending CIR and family wishing to go home with home therapies.   PLAN:  1. Continue current dose of ceftraixone. 2. Await orthopedic re-evaluation need for additional surgery. 3. PICC or central line placement pending disposition decision.   Principal Problem:   Bacteremia due to Escherichia coli Active Problems:   Diabetes mellitus with complication (HCC)   Anemia   Supratherapeutic INR   Sinus pause: 4.02-4.80sec per telemetry 09/08/2016   Sepsis ([place])   Closed dislocation of right knee   Effusion of right knee joint   AF (paroxysmal atrial fibrillation) (HCC)   Iron deficiency anemia due to chronic blood loss   Dislocation of right knee   Osteomyelitis of right knee region [facility])   Severe protein-calorie malnutrition (HCC)   Abscess of thigh   Chronic osteomyelitis of right femur with draining sinus ([place])   Infection of total right knee replacement ([place])   Pressure injury of skin   Acute respiratory failure ([place])   . amiodarone  200 mg Oral Daily  . chlorhexidine  15 mL Mouth Rinse BID  . chlorhexidine gluconate (MEDLINE KIT)  15 mL Mouth Rinse BID  . Chlorhexidine Gluconate Cloth  6 each Topical Daily  . cholecalciferol   2,000 Units Oral BID  . diltiazem  120 mg Oral Daily  . feeding supplement (ENSURE ENLIVE)  237 mL Oral BID BM  . ferrous sulfate  300 mg Oral BID WC  . gabapentin  100 mg Oral TID  . Gerhardt's butt cream   Topical TID  . mouth rinse  15 mL Mouth Rinse q12n4p  . metoprolol tartrate  50 mg Oral BID  . multivitamin with minerals  1 tablet Oral Daily  . pantoprazole  40 mg Oral Daily  . pravastatin  20 mg Oral q1800  . senna-docusate  2 tablet Oral BID  . sodium chloride flush  10-40 mL Intracatheter Q12H  . sodium chloride flush  10-40 mL Intracatheter Q12H  . sodium chloride flush  3 mL Intravenous Q12H  . [START ON 03/16/2019] Vitamin D (Ergocalciferol)  50,000 Units Oral Q7 days    SUBJECTIVE:  Afebrile overnight with improving WBC count now normal at 9.3. Awaiting re-evaluation by orthopedics for further surgical intervention if indicated. Having pain in both legs this afternoon otherwise doing okay. Denies fevers, chills, or sweats.   Allergies  Allergen Reactions  . Aspirin Hives and Swelling    Angioedema   . Rofecoxib Hives  . Hydrocodone Hives    Tolerates oxycodone and tramadol     Review of Systems: Review of Systems  Constitutional: Negative for chills, fever and weight loss.  Respiratory: Negative for cough, shortness of breath and wheezing.   Cardiovascular: Negative for chest pain and leg swelling.  Gastrointestinal: Negative for  abdominal pain, constipation, diarrhea, nausea and vomiting.  Musculoskeletal:       Positive for bilateral leg pain.   Skin: Negative for rash.      OBJECTIVE: Vitals:   03/13/19 0600 03/13/19 0700 03/13/19 0730 03/13/19 0800  BP: 117/60     Pulse: 89 89 89   Resp: 11 (!) 22 18   Temp:    98.4 F (36.9 C)  TempSrc:    Axillary  SpO2: 96% 95% 98%   Weight:      Height:       Body mass index is 35.56 kg/m.  Physical Exam Constitutional:      General: Robin Snow is not in acute distress.    Appearance: Robin Snow is  well-developed. Robin Snow is obese. Robin Snow is not ill-appearing.     Comments: Seated in the chair; pleasant.   Cardiovascular:     Rate and Rhythm: Normal rate and regular rhythm.     Heart sounds: Normal heart sounds.  Pulmonary:     Effort: Pulmonary effort is normal.     Breath sounds: Normal breath sounds. No wheezing, rhonchi or rales.  Musculoskeletal:     Comments: Surgical site on right appears clean and dry with wound VAC in place.  Skin:    General: Skin is warm and dry.  Neurological:     Mental Status: Robin Snow is alert.  Psychiatric:        Mood and Affect: Mood normal.     Lab Results Lab Results  Component Value Date   WBC 9.3 03/13/2019   HGB 7.0 (L) 03/13/2019   HCT 22.5 (L) 03/13/2019   MCV 97.0 03/13/2019   PLT 175 03/13/2019    Lab Results  Component Value Date   CREATININE 0.93 03/13/2019   BUN 16 03/13/2019   NA 139 03/13/2019   K 4.9 03/13/2019   CL 104 03/13/2019   CO2 26 03/13/2019    Lab Results  Component Value Date   ALT 19 03/13/2019   AST 30 03/13/2019   ALKPHOS 70 03/13/2019   BILITOT 0.6 03/13/2019     Microbiology: No results found for this or any previous visit (from the past 240 hour(s)).   Terri Piedra, NP [facility] [phone] Pager  03/13/2019  11:41 AM

## 2019-03-13 NOTE — Progress Notes (Signed)
Occupational Therapy Treatment Patient Details Name: Robin Snow MRN: [id number] DOB: 1941-03-19 Today's Date: 03/13/2019    History of present illness 78  y/o female with history of atrial fibrillation, HTN, DM,CKD III had a fall at home when patient was trying to get out of the bed. Hx of R knee pain and swelling for the last few months. Found with sepsis secondary to E. coli bacteremia. Hospitalized from 10/29-11/4 with sepsis related to E. coli bacteremia, UTI, and bacterial arthritis right knee. Found to have dislocated right knee prothestic--02/22/19 s/p Closed reduction of right knee prosthetic dislocation. 11/25 underwent R AKA intubated with respiratory failure extubation 03/09/19   OT comments  Pt making slow but steady progress with self care and adls.  Pt with RAKA presents now with decreased sitting balance, inability to stand and dependence on others for adls. Do feel pt may be able to achieve min assist level with transfers and adls so she could d/c home with a supportive family member.  Pt's pain, wound vac and activity tolerance is limited at this time.  Will continue to see acutely  Follow Up Recommendations  CIR    Equipment Recommendations  Wheelchair (measurements OT);Wheelchair cushion (measurements OT);Hospital bed;Other (comment)    Recommendations for Other Services      Precautions / Restrictions Precautions Precautions: Fall Precaution Comments: wound vac R AKA Knee Immobilizer - Right: On at all times Restrictions Weight Bearing Restrictions: No RLE Weight Bearing: Non weight bearing       Mobility Bed Mobility Overal bed mobility: Needs Assistance Bed Mobility: Supine to Sit Rolling: Max assist;+2 for physical assistance;+2 for safety/equipment   Supine to sit: Max assist;+2 for physical assistance     General bed mobility comments: Pt initally wanted to move on her own but unable to do so.  Pt pivoted on chucks pads with max  assist.  Transfers Overall transfer level: Needs assistance Equipment used: Sliding board Transfers: Lateral/Scoot Transfers          Lateral/Scoot Transfers: Max assist;With slide board General transfer comment: max assist x2 to use slide board.  ASsist to place board and weigh shift to slide to chair from bed.    Balance Overall balance assessment: Needs assistance Sitting-balance support: Bilateral upper extremity supported;Feet supported Sitting balance-Leahy Scale: Poor Sitting balance - Comments: pt required mod to maxA to maintain EOB balance, worked on pulling self forward to midline as pt rests in posterior lean, worked on lateral sidebending to L/R (going down on elbows) Postural control: Posterior lean     Standing balance comment: unable to stand                           ADL either performed or assessed with clinical judgement   ADL Overall ADL's : Needs assistance/impaired     Grooming: Set up;Sitting Grooming Details (indicate cue type and reason): Pt groomed once in chair                 Toilet Transfer: Maximal assistance;+2 for physical assistance;Requires drop arm;Transfer board Toilet Transfer Details (indicate cue type and reason): Pt used transfer board to transfer to chair with arm down. Pt will need drop arm commode or will work toward teaching T transfer where pt backs onto the commode that sits flush with bed. Toileting- Clothing Manipulation and Hygiene: +2 for physical assistance;Moderate assistance;Sitting/lateral lean       Functional mobility during ADLs: Maximal assistance;+2 for physical assistance  General ADL Comments: Pt sat at EOB and leaned L and R onto elbows to strengthen core and prepare to lean sideways to place slide board.     Vision   Vision Assessment?: No apparent visual deficits   Perception     Praxis      Cognition Arousal/Alertness: Awake/alert Behavior During Therapy: Flat affect Overall  Cognitive Status: Impaired/Different from baseline Area of Impairment: Problem solving                             Problem Solving: Slow processing;Decreased initiation;Requires verbal cues;Difficulty sequencing;Requires tactile cues General Comments: requests to do things on her own but then doesn't physically initiate, pt requiring max verbal and tactile cues to complete task        Exercises     Shoulder Instructions       General Comments Pt overall very limited with mobilty.  Pt appears mildly unaware of severity of her balance and mobility deficits now.     Pertinent Vitals/ Pain       Pain Assessment: Faces Faces Pain Scale: Hurts whole lot Pain Location: R AKA Pain Descriptors / Indicators: Grimacing;Operative site guarding;Moaning;Guarding Pain Intervention(s): Limited activity within patient's tolerance;Monitored during session;Repositioned  Home Living                                          Prior Functioning/Environment              Frequency  Min 2X/week        Progress Toward Goals  OT Goals(current goals can now be found in the care plan section)  Progress towards OT goals: Progressing toward goals  Acute Rehab OT Goals Patient Stated Goal: to return home and cook OT Goal Formulation: With patient/family Time For Goal Achievement: 03/24/19 Potential to Achieve Goals: Good ADL Goals Pt Will Perform Grooming: with set-up;sitting Pt Will Perform Lower Body Dressing: with min assist;with adaptive equipment;sit to/from stand Pt Will Transfer to Toilet: with +2 assist;with max assist;bedside commode;with transfer board Pt Will Perform Toileting - Clothing Manipulation and hygiene: with supervision;sit to/from stand Pt/caregiver will Perform Home Exercise Program: Increased ROM;Increased strength;Both right and left upper extremity;With minimal assist;With written HEP provided Additional ADL Goal #1: pt will complete bed  mobility mod (A) as precursor to adls  Plan Discharge plan remains appropriate    Co-evaluation    PT/OT/SLP Co-Evaluation/Treatment: Yes Reason for Co-Treatment: Complexity of the patient's impairments (multi-system involvement);To address functional/ADL transfers PT goals addressed during session: Mobility/safety with mobility OT goals addressed during session: ADL's and self-care      AM-PAC OT "6 Clicks" Daily Activity     Outcome Measure   Help from another person eating meals?: A Little Help from another person taking care of personal grooming?: A Little Help from another person toileting, which includes using toliet, bedpan, or urinal?: Total Help from another person bathing (including washing, rinsing, drying)?: A Lot Help from another person to put on and taking off regular upper body clothing?: A Lot Help from another person to put on and taking off regular lower body clothing?: Total 6 Click Score: 12    End of Session    OT Visit Diagnosis: Unsteadiness on feet (R26.81);Muscle weakness (generalized) (M62.81);Pain Pain - Right/Left: Right Pain - part of body: Leg   Activity Tolerance Patient limited by  pain   Patient Left in chair;with call bell/phone within reach;with chair alarm set   Nurse Communication Mobility status;Need for lift equipment        Time: [phone] OT Time Calculation (min): 22 min  Charges: OT General Charges $OT Visit: 1 Visit OT Treatments $Self Care/Home Management : 8-22 mins    Hope Budds 03/13/2019, 11:50 AM

## 2019-03-13 NOTE — Progress Notes (Signed)
Inpatient Rehabilitation-Admissions Coordinator   IP Rehab Consult received. Note there may need for additional surgery. AC will await orthopedic re-evaluation prior to moving forward with rehab consult/assessment.   Will continue to follow.   Jhonnie Garner, OTR/L  Rehab Admissions Coordinator  [phone] 03/13/2019 3:53 PM

## 2019-03-14 LAB — BASIC METABOLIC PANEL
Anion gap: 9 (ref 5–15)
BUN: 13 mg/dL (ref 8–23)
CO2: 27 mmol/L (ref 22–32)
Calcium: 7.9 mg/dL — ABNORMAL LOW (ref 8.9–10.3)
Chloride: 101 mmol/L (ref 98–111)
Creatinine, Ser: 0.8 mg/dL (ref 0.44–1.00)
GFR calc Af Amer: >60 mL/min (ref >60)
GFR calc non Af Amer: >60 mL/min (ref >60)
Glucose, Bld: 90 mg/dL (ref 70–99)
Potassium: 4.3 mmol/L (ref 3.5–5.1)
Sodium: 137 mmol/L (ref 135–145)

## 2019-03-14 LAB — GLUCOSE, CAPILLARY
Glucose-Capillary: 83 mg/dL (ref 70–99)
Glucose-Capillary: 79 mg/dL (ref 70–99)
Glucose-Capillary: 86 mg/dL (ref 70–99)
Glucose-Capillary: 85 mg/dL (ref 70–99)
Glucose-Capillary: 99 mg/dL (ref 70–99)

## 2019-03-14 LAB — CBC
HCT: 19.1 % — ABNORMAL LOW (ref 36.0–46.0)
Hemoglobin: 5.9 g/dL — CL (ref 12.0–15.0)
MCH: 30.6 pg (ref 26.0–34.0)
MCHC: 30.9 g/dL (ref 30.0–36.0)
MCV: 99 fL (ref 80.0–100.0)
Platelets: 145 10*3/uL — ABNORMAL LOW (ref 150–400)
RBC: 1.93 MIL/uL — ABNORMAL LOW (ref 3.87–5.11)
RDW: 19.4 % — ABNORMAL HIGH (ref 11.5–15.5)
WBC: 6.1 10*3/uL (ref 4.0–10.5)
nRBC: 1.5 % — ABNORMAL HIGH (ref 0.0–0.2)

## 2019-03-14 LAB — MAGNESIUM: Magnesium: 1.8 mg/dL (ref 1.7–2.4)

## 2019-03-14 LAB — HEMOGLOBIN AND HEMATOCRIT, BLOOD
HCT: 27.2 % — ABNORMAL LOW (ref 36.0–46.0)
Hemoglobin: 9.1 g/dL — ABNORMAL LOW (ref 12.0–15.0)

## 2019-03-14 LAB — PREPARE RBC (CROSSMATCH)

## 2019-03-14 MED ORDER — SODIUM CHLORIDE 0.9% IV SOLUTION
Freq: Once | INTRAVENOUS | Status: AC
  Administered 2019-03-14: 08:00:00 via INTRAVENOUS

## 2019-03-14 MED ORDER — SODIUM CHLORIDE 0.9 % IV SOLN
INTRAVENOUS | Status: DC
  Administered 2019-03-14: 23:00:00 via INTRAVENOUS

## 2019-03-14 NOTE — Progress Notes (Signed)
[facility] for Infectious Disease  Date of Admission:  02/22/2019     Total days of antibiotics 21 Ceftriaxone 11/18 >>        ASSESSMENT:  Ms. Mcarthy continues to receive treatment for recurrent E. coli bacteremia complicating total knee prosthesis with extension up to her right hip now status post AKA on 11/25 and concern for aortic valve endocarditis.  She continues to remain stable.  No surgical interventions per orthopedics.  Continue with plan of 6 weeks of IV therapy with initial date of 11/29 through 04/12/2019 for endocarditis.  We will continue with ceftriaxone and plan for follow up in ID office.  ID will sign off and be available as needed.   PLAN:  1. Continue current dose of ceftriaxone through 04/12/19. 2. OPAT orders below if needed pending discharge 3. PICC line or central line placement when ready for discharge.  4. Continue wound care per orthopedics.  5. Follow up in ID office following discharge.   Diagnosis: Recurrent E. Coli Bacteremia with aortic valve endocarditis   Culture Result: E. Coli   Allergies  Allergen Reactions  . Aspirin Hives and Swelling    Angioedema   . Rofecoxib Hives  . Hydrocodone Hives    Tolerates oxycodone and tramadol    OPAT Orders Discharge antibiotics: Ceftriaxone Per pharmacy protocol  Aim for Vancomycin trough 15-20 or AUC 400-550 (unless otherwise indicated) Duration: 6 weeks  End Date: 04/12/19  [facility] Care Per Protocol:  Labs weekly while on IV antibiotics: _X_ CBC with differential _X_ BMP __ CMP _X_ CRP _X_ ESR __ Vancomycin trough __ CK  _X_ Please pull PIC at completion of IV antibiotics __ Please leave PIC in place until doctor has seen patient or been notified  Fax weekly labs to [phone]  Clinic Follow Up Appt:  04/12/19 at 9:30 with Dr. Prince Rome   Principal Problem:   Bacteremia due to Escherichia coli Active Problems:   Diabetes mellitus with complication ([place])   Anemia  Supratherapeutic INR   Sinus pause: 4.02-4.80sec per telemetry 09/08/2016   Sepsis ([place])   Closed dislocation of right knee   Effusion of right knee joint   AF (paroxysmal atrial fibrillation) (HCC)   Iron deficiency anemia due to chronic blood loss   Dislocation of right knee   Osteomyelitis of right knee region ([place])   Severe protein-calorie malnutrition ([place])   Abscess of thigh   Chronic osteomyelitis of right femur with draining sinus ([place])   Infection of total right knee replacement ([place])   Pressure injury of skin   Acute respiratory failure ([place])   . amiodarone  200 mg Oral Daily  . chlorhexidine  15 mL Mouth Rinse BID  . chlorhexidine gluconate (MEDLINE KIT)  15 mL Mouth Rinse BID  . Chlorhexidine Gluconate Cloth  6 each Topical Daily  . cholecalciferol  2,000 Units Oral BID  . diltiazem  120 mg Oral Daily  . feeding supplement (ENSURE ENLIVE)  237 mL Oral BID BM  . ferrous sulfate  300 mg Oral BID WC  . gabapentin  100 mg Oral TID  . Gerhardt's butt cream   Topical TID  . mouth rinse  15 mL Mouth Rinse q12n4p  . metoprolol tartrate  50 mg Oral BID  . multivitamin with minerals  1 tablet Oral Daily  . pantoprazole  40 mg Oral Daily  . pravastatin  20 mg Oral q1800  . senna-docusate  2 tablet Oral BID  . sodium chloride flush  10-40 mL Intracatheter Q12H  . sodium chloride flush  3 mL Intravenous Q12H  . [START ON 03/16/2019] Vitamin D (Ergocalciferol)  50,000 Units Oral Q7 days    SUBJECTIVE:  Afebrile overnight without leukocytosis and no acute events. Feeling okay today. No complaints.   Allergies  Allergen Reactions  . Aspirin Hives and Swelling    Angioedema   . Rofecoxib Hives  . Hydrocodone Hives    Tolerates oxycodone and tramadol     Review of Systems: Review of Systems  Constitutional: Negative for chills, fever and weight loss.  Respiratory: Negative for cough, shortness of breath and wheezing.   Cardiovascular: Negative for chest pain and leg  swelling.  Gastrointestinal: Negative for abdominal pain, constipation, diarrhea, nausea and vomiting.  Skin: Negative for rash.      OBJECTIVE: Vitals:   03/14/19 1000 03/14/19 1026 03/14/19 1030 03/14/19 1043  BP: 111/60 109/71 109/71 122/78  Pulse: 92 92 91 91  Resp: '18 16 17 14  '$ Temp: 98.9 F (37.2 C)  99.4 F (37.4 C) 99.2 F (37.3 C)  TempSrc: Oral  Oral Oral  SpO2: 98% 99% 100% 100%  Weight:      Height:       Body mass index is 34.49 kg/m.  Physical Exam Constitutional:      General: She is not in acute distress.    Appearance: She is well-developed.  Cardiovascular:     Rate and Rhythm: Normal rate and regular rhythm.     Heart sounds: Normal heart sounds.  Pulmonary:     Effort: Pulmonary effort is normal.     Breath sounds: Normal breath sounds.  Skin:    General: Skin is warm and dry.  Neurological:     Mental Status: She is alert and oriented to person, place, and time.  Psychiatric:        Behavior: Behavior normal.        Thought Content: Thought content normal.        Judgment: Judgment normal.     Lab Results Lab Results  Component Value Date   WBC 6.1 03/14/2019   HGB 5.9 (LL) 03/14/2019   HCT 19.1 (L) 03/14/2019   MCV 99.0 03/14/2019   PLT 145 (L) 03/14/2019    Lab Results  Component Value Date   CREATININE 0.80 03/14/2019   BUN 13 03/14/2019   NA 137 03/14/2019   K 4.3 03/14/2019   CL 101 03/14/2019   CO2 27 03/14/2019    Lab Results  Component Value Date   ALT 19 03/13/2019   AST 30 03/13/2019   ALKPHOS 70 03/13/2019   BILITOT 0.6 03/13/2019     Microbiology: No results found for this or any previous visit (from the past 240 hour(s)).   Terri Piedra, NP [facility] for [place] [facility] [phone] Pager  03/14/2019  11:13 AM

## 2019-03-14 NOTE — Progress Notes (Signed)
PROGRESS NOTE    Robin Snow  MRN:[id number] DOB: 12/23/1940 DOA: 02/22/2019 PCP: Crawford, Elizabeth A, MD   Brief Narrative:  78 yo F with R knee replacement, s/p revision in 2018, presenting with prosthetic dislocation and abscess. S/p closed reduction, drainage 11/18. Treating for e.coli bacteremia. Underwent R AKA 11/25 and was intubated after procedure for respiratory failure.  While in the ICU patient noted to have positive bacteremia and TTE and TEE both concerning for endocarditis, ID following, will need ongoing antibiotics through 04/12/2019.  Patient did have some postop bleeding overnight on 03/13/2019 now status post unit PRBC.  Otherwise patient tolerated procedure well, wound VAC removed, attempting to work with PT being evaluated by CIR for possible disposition in the next few days again pending clinical course.  Subjective: Overnight patient had hemoglobin dropped to 5.9, likely postop bleeding and wound VAC removal.  At this time patient is pleasant, awake alert oriented without any complaints, tolerating p.o. quite well, family at bedside indicates this is the best she has looked since admission.  Patient excited to begin working with PT for possible transition to CIR and ultimate plans are for discharge home.  Denies chest pain, shortness of breath, nausea, vomiting, diarrhea, constipation, headache, fevers, chills.  Assessment & Plan:   Principal Problem:   Bacteremia due to Escherichia coli Active Problems:   Diabetes mellitus with complication (HCC)   Anemia   Supratherapeutic INR   Sinus pause: 4.02-4.80sec per telemetry 09/08/2016   Sepsis (HCC)   Closed dislocation of right knee   Effusion of right knee joint   AF (paroxysmal atrial fibrillation) (HCC)   Iron deficiency anemia due to chronic blood loss   Dislocation of right knee   Osteomyelitis of right knee region (HCC)   Severe protein-calorie malnutrition (HCC)   Abscess of thigh   Chronic osteomyelitis  of right femur with draining sinus (HCC)   Infection of total right knee replacement (HCC)   Pressure injury of skin   Acute respiratory failure (HCC)   Sepsis secondary to endocarditis, E.Coli bacteriemia in the context of infected knee hardware.  Now status post right AKA -TEE completed 12/2 with evidence of  Mobile mass and thickening of aortic valve suspicious for aortic valvular vegetation  ID following and rec 6 weeks rocephin -tentative stop date 04/12/2019 -PICC line placed ID does not advise removal of pacemaker at this time Awaiting ortho to return: to decide on possible hip disarticulation in setting of muscle necrosis   Acute and subacute blood loss anemia  Hgb dropped overnight to 5.9, 2 unit PRBC transfusion today Hemoglobin this afternoon, if less than 7.5 would transfuse additional unit given patient's general downtrending over the past few days Continue to hold heparin/anticoagulation Transfuse for hgb < 7 or symptomatic anemia Ferrous sulfate supplement   Post-operative respiratory failure requiring mechanical ventilation, resolved  -In the setting of E-coli bacteremia, recent AKA, and significant deconditioning  -Extubated 12/3 -Continue IS, encourage t/b/db  -doing well on RA -follow-up for hypoxia with exertion  Chronic kidney disease stage IIIa  -[place] creatinine between 0.9-1.1  -follow labs, urine output adequate, tolerating p.o. quite well  Atrial fibrillation/sick sinus syndrome status post pacemaker -s/p amio gtt. Now rate controlled a fib -Home anticoagulation - coumadin -currently holding as above given ongoing postop anemia -continue home amiodarone, diltiazem and metoprolol  Protein calorie malnutrition  Encourage diet -tolerating p.o. more adequately over the past 24 hours Cont supplemental protein drinks  Severe deconditioning / Debility, ambulatory dysfunction -  PROGRESS NOTE    Robin Snow  MRN:[id number] DOB: 12/23/1940 DOA: 02/22/2019 PCP: Crawford, Elizabeth A, MD   Brief Narrative:  78 yo F with R knee replacement, s/p revision in 2018, presenting with prosthetic dislocation and abscess. S/p closed reduction, drainage 11/18. Treating for e.coli bacteremia. Underwent R AKA 11/25 and was intubated after procedure for respiratory failure.  While in the ICU patient noted to have positive bacteremia and TTE and TEE both concerning for endocarditis, ID following, will need ongoing antibiotics through 04/12/2019.  Patient did have some postop bleeding overnight on 03/13/2019 now status post unit PRBC.  Otherwise patient tolerated procedure well, wound VAC removed, attempting to work with PT being evaluated by CIR for possible disposition in the next few days again pending clinical course.  Subjective: Overnight patient had hemoglobin dropped to 5.9, likely postop bleeding and wound VAC removal.  At this time patient is pleasant, awake alert oriented without any complaints, tolerating p.o. quite well, family at bedside indicates this is the best she has looked since admission.  Patient excited to begin working with PT for possible transition to CIR and ultimate plans are for discharge home.  Denies chest pain, shortness of breath, nausea, vomiting, diarrhea, constipation, headache, fevers, chills.  Assessment & Plan:   Principal Problem:   Bacteremia due to Escherichia coli Active Problems:   Diabetes mellitus with complication (HCC)   Anemia   Supratherapeutic INR   Sinus pause: 4.02-4.80sec per telemetry 09/08/2016   Sepsis (HCC)   Closed dislocation of right knee   Effusion of right knee joint   AF (paroxysmal atrial fibrillation) (HCC)   Iron deficiency anemia due to chronic blood loss   Dislocation of right knee   Osteomyelitis of right knee region (HCC)   Severe protein-calorie malnutrition (HCC)   Abscess of thigh   Chronic osteomyelitis  of right femur with draining sinus (HCC)   Infection of total right knee replacement (HCC)   Pressure injury of skin   Acute respiratory failure (HCC)   Sepsis secondary to endocarditis, E.Coli bacteriemia in the context of infected knee hardware.  Now status post right AKA -TEE completed 12/2 with evidence of  Mobile mass and thickening of aortic valve suspicious for aortic valvular vegetation  ID following and rec 6 weeks rocephin -tentative stop date 04/12/2019 -PICC line placed ID does not advise removal of pacemaker at this time Awaiting ortho to return: to decide on possible hip disarticulation in setting of muscle necrosis   Acute and subacute blood loss anemia  Hgb dropped overnight to 5.9, 2 unit PRBC transfusion today Hemoglobin this afternoon, if less than 7.5 would transfuse additional unit given patient's general downtrending over the past few days Continue to hold heparin/anticoagulation Transfuse for hgb < 7 or symptomatic anemia Ferrous sulfate supplement   Post-operative respiratory failure requiring mechanical ventilation, resolved  -In the setting of E-coli bacteremia, recent AKA, and significant deconditioning  -Extubated 12/3 -Continue IS, encourage t/b/db  -doing well on RA -follow-up for hypoxia with exertion  Chronic kidney disease stage IIIa  -[place] creatinine between 0.9-1.1  -follow labs, urine output adequate, tolerating p.o. quite well  Atrial fibrillation/sick sinus syndrome status post pacemaker -s/p amio gtt. Now rate controlled a fib -Home anticoagulation - coumadin -currently holding as above given ongoing postop anemia -continue home amiodarone, diltiazem and metoprolol  Protein calorie malnutrition  Encourage diet -tolerating p.o. more adequately over the past 24 hours Cont supplemental protein drinks  Severe deconditioning / Debility, ambulatory dysfunction -  PROGRESS NOTE    Robin Snow  MRN:[id number] DOB: 12/23/1940 DOA: 02/22/2019 PCP: Crawford, Elizabeth A, MD   Brief Narrative:  78 yo F with R knee replacement, s/p revision in 2018, presenting with prosthetic dislocation and abscess. S/p closed reduction, drainage 11/18. Treating for e.coli bacteremia. Underwent R AKA 11/25 and was intubated after procedure for respiratory failure.  While in the ICU patient noted to have positive bacteremia and TTE and TEE both concerning for endocarditis, ID following, will need ongoing antibiotics through 04/12/2019.  Patient did have some postop bleeding overnight on 03/13/2019 now status post unit PRBC.  Otherwise patient tolerated procedure well, wound VAC removed, attempting to work with PT being evaluated by CIR for possible disposition in the next few days again pending clinical course.  Subjective: Overnight patient had hemoglobin dropped to 5.9, likely postop bleeding and wound VAC removal.  At this time patient is pleasant, awake alert oriented without any complaints, tolerating p.o. quite well, family at bedside indicates this is the best she has looked since admission.  Patient excited to begin working with PT for possible transition to CIR and ultimate plans are for discharge home.  Denies chest pain, shortness of breath, nausea, vomiting, diarrhea, constipation, headache, fevers, chills.  Assessment & Plan:   Principal Problem:   Bacteremia due to Escherichia coli Active Problems:   Diabetes mellitus with complication (HCC)   Anemia   Supratherapeutic INR   Sinus pause: 4.02-4.80sec per telemetry 09/08/2016   Sepsis (HCC)   Closed dislocation of right knee   Effusion of right knee joint   AF (paroxysmal atrial fibrillation) (HCC)   Iron deficiency anemia due to chronic blood loss   Dislocation of right knee   Osteomyelitis of right knee region (HCC)   Severe protein-calorie malnutrition (HCC)   Abscess of thigh   Chronic osteomyelitis  of right femur with draining sinus (HCC)   Infection of total right knee replacement (HCC)   Pressure injury of skin   Acute respiratory failure (HCC)   Sepsis secondary to endocarditis, E.Coli bacteriemia in the context of infected knee hardware.  Now status post right AKA -TEE completed 12/2 with evidence of  Mobile mass and thickening of aortic valve suspicious for aortic valvular vegetation  ID following and rec 6 weeks rocephin -tentative stop date 04/12/2019 -PICC line placed ID does not advise removal of pacemaker at this time Awaiting ortho to return: to decide on possible hip disarticulation in setting of muscle necrosis   Acute and subacute blood loss anemia  Hgb dropped overnight to 5.9, 2 unit PRBC transfusion today Hemoglobin this afternoon, if less than 7.5 would transfuse additional unit given patient's general downtrending over the past few days Continue to hold heparin/anticoagulation Transfuse for hgb < 7 or symptomatic anemia Ferrous sulfate supplement   Post-operative respiratory failure requiring mechanical ventilation, resolved  -In the setting of E-coli bacteremia, recent AKA, and significant deconditioning  -Extubated 12/3 -Continue IS, encourage t/b/db  -doing well on RA -follow-up for hypoxia with exertion  Chronic kidney disease stage IIIa  -[place] creatinine between 0.9-1.1  -follow labs, urine output adequate, tolerating p.o. quite well  Atrial fibrillation/sick sinus syndrome status post pacemaker -s/p amio gtt. Now rate controlled a fib -Home anticoagulation - coumadin -currently holding as above given ongoing postop anemia -continue home amiodarone, diltiazem and metoprolol  Protein calorie malnutrition  Encourage diet -tolerating p.o. more adequately over the past 24 hours Cont supplemental protein drinks  Severe deconditioning / Debility, ambulatory dysfunction -

## 2019-03-14 NOTE — Progress Notes (Signed)
Inpatient Rehabilitation-Admissions Coordinator   IP Rehab Consult received. Met with pt and her daughter bedside to discuss recommended IP Rehab program. Pt slightly confused this AM and looking to her daughter for assistance when answering questions. We discussed differences between SNF and CIR program, expected length of stay with rehab, anticipated assist required at DC, and further discussed program details. Pt and her daughter appear to favor CIR at this time.   Pt's daughter would like to discuss her options with her family before making a decision on preferred venue.   AC will continue to follow.   Jhonnie Garner, OTR/L  Rehab Admissions Coordinator  [phone] 03/14/2019 11:44 AM

## 2019-03-14 NOTE — Progress Notes (Signed)
PHARMACY CONSULT NOTE FOR:  OUTPATIENT  PARENTERAL ANTIBIOTIC THERAPY (OPAT)  Indication: E coli endocarditis Regimen: Ceftriaxone 2 gm every 24 hours End date: 04/12/19  IV antibiotic discharge orders are pended. To discharging provider:  please sign these orders via discharge navigator,  Select New Orders & click on the button choice - Manage This Unsigned Work.     Thank you for allowing pharmacy to be a part of this patient's care.  Jimmy Footman, PharmD, BCPS, BCIDP Infectious Diseases Clinical Pharmacist Phone: [phone] 03/14/2019, 11:21 AM

## 2019-03-14 NOTE — Progress Notes (Signed)
[facility] Progress Note Patient Name: Robin Snow DOB: Apr 28, 1940 MRN: [id number]   Date of Service  03/14/2019  HPI/Events of Note  Anemia - Hgb = 5.9.  eICU Interventions  Will transfuse 2 units PRBC now.      Intervention Category Major Interventions: Other:  Sommer,Steven Cornelia Copa 03/14/2019, 6:52 AM

## 2019-03-14 NOTE — Progress Notes (Signed)
S/P R BKA  Minimal output in VAC. Vac removed . Noted Skin maceration but wound edges well opposed. Foul odor  but no induration or necrosis . Bloody drainage only. A/P S/P AKA . Skin maceration and odor expected on Wound Vac removal.No other indicators of infection at this time No surgery planned. Daily dry dressing Changes. Discussed with [name]

## 2019-03-14 NOTE — Progress Notes (Signed)
CRITICAL VALUE ALERT  Critical Value:  Hemoglobin 5.9   Date & Time Notied:  03/14/2019 9774  Provider Notified: S. Sommers, CCMD  Orders Received/Actions taken: transfuse 2PRBCs per order

## 2019-03-15 DIAGNOSIS — J9600 Acute respiratory failure, unspecified whether with hypoxia or hypercapnia: Secondary | ICD-10-CM

## 2019-03-15 LAB — TYPE AND SCREEN
ABO/RH(D): A POS
Antibody Screen: POSITIVE
Donor AG Type: NEGATIVE
Donor AG Type: NEGATIVE
Donor AG Type: NEGATIVE
Donor AG Type: NEGATIVE
Unit division: 0
Unit division: 0
Unit division: 0
Unit division: 0
Unit division: 0

## 2019-03-15 LAB — CBC
HCT: 27.1 % — ABNORMAL LOW (ref 36.0–46.0)
Hemoglobin: 8.5 g/dL — ABNORMAL LOW (ref 12.0–15.0)
MCH: 30 pg (ref 26.0–34.0)
MCHC: 31.4 g/dL (ref 30.0–36.0)
MCV: 95.8 fL (ref 80.0–100.0)
Platelets: 181 10*3/uL (ref 150–400)
RBC: 2.83 MIL/uL — ABNORMAL LOW (ref 3.87–5.11)
RDW: 20.2 % — ABNORMAL HIGH (ref 11.5–15.5)
WBC: 5.8 10*3/uL (ref 4.0–10.5)
nRBC: 0.7 % — ABNORMAL HIGH (ref 0.0–0.2)

## 2019-03-15 LAB — BPAM RBC
Blood Product Expiration Date: 202012092359
Blood Product Expiration Date: 202012172359
Blood Product Expiration Date: 202012202359
Blood Product Expiration Date: 202012202359
Blood Product Expiration Date: 202101092359
ISSUE DATE / TIME: 202012051217
ISSUE DATE / TIME: 202012051414
ISSUE DATE / TIME: 202012080757
ISSUE DATE / TIME: 202012081018
Unit Type and Rh: 5100
Unit Type and Rh: 5100
Unit Type and Rh: 600
Unit Type and Rh: 600
Unit Type and Rh: 600

## 2019-03-15 MED ORDER — ENSURE ENLIVE PO LIQD
237.0000 mL | Freq: Three times a day (TID) | ORAL | Status: DC
  Administered 2019-03-15 – 2019-03-29 (×38): 237 mL via ORAL

## 2019-03-15 NOTE — Progress Notes (Signed)
Inpatient Rehabilitation-Admissions Coordinator   Family would like to proceed with CIR for therapy needs. I have confirmed DC support and we have discussed possible environmental modifications to her house (ie. Ramp) which they are currently pursuing. I will begin insurance authorization process for possible admit.   Jhonnie Garner, OTR/L  Rehab Admissions Coordinator  [phone] 03/15/2019 11:22 AM

## 2019-03-15 NOTE — Progress Notes (Signed)
Physical Therapy Treatment Patient Details Name: Robin Snow MRN: [id number] DOB: 26-Jul-1940 Today's Date: 03/15/2019    History of Present Illness 78  y/o female with history of atrial fibrillation, HTN, DM,CKD III had a fall at home when patient was trying to get out of the bed. Hx of R knee pain and swelling for the last few months. Found with sepsis secondary to E. coli bacteremia. Hospitalized from 10/29-11/4 with sepsis related to E. coli bacteremia, UTI, and bacterial arthritis right knee. Found to have dislocated right knee prothestic--02/22/19 s/p Closed reduction of right knee prosthetic dislocation. 11/25 underwent R AKA intubated with respiratory failure extubation 03/09/19    PT Comments    Patient soiled on arrival with 1 RN attempting to help her roll (unsuccessfully). Assisted with mobility with pt requiring incr time and cues due to fear of pain and pain. After rolling, initiated supine exercises x 4 extremities with pt tolerating well with max encouragment and assist. She is able to verbalize where she is and her goals for therapy "I don't want to lie in this bed forever." Attempted to persuade her to try to sit EOB, however she was anxious and would not attempt. Feel it will be necessary to attempt to pre-medicate her for pain prior to next session.     Follow Up Recommendations  CIR (will continue to monitor)     Equipment Recommendations  Other (comment)(defer to next venue)    Recommendations for Other Services       Precautions / Restrictions Precautions Precautions: Fall Restrictions Weight Bearing Restrictions: Yes RLE Weight Bearing: Non weight bearing    Mobility  Bed Mobility Overal bed mobility: Needs Assistance Bed Mobility: Rolling Rolling: Max assist;+2 for physical assistance;+2 for safety/equipment         General bed mobility comments: Patient required rolling assist for pericare; she is very anxious re: increasing her pain and needs max  encouragement to try to help roll; she would not attempt to sit EOB; placed in chair position at end of session  Transfers                    Ambulation/Gait                 Stairs             Wheelchair Mobility    Modified Rankin (Stroke Patients Only)       Balance                                            Cognition Arousal/Alertness: Awake/alert Behavior During Therapy: Flat affect Overall Cognitive Status: No family/caregiver present to determine baseline cognitive functioning Area of Impairment: Problem solving                             Problem Solving: Slow processing;Decreased initiation;Requires verbal cues;Difficulty sequencing;Requires tactile cues General Comments: requests to do things on her own but then doesn't physically initiate, pt requiring max verbal and tactile cues to complete task      Exercises General Exercises - Upper Extremity Shoulder Flexion: AROM;Seated;5 reps;Left(attempted rt and too painful) General Exercises - Lower Extremity Heel Slides: AAROM;Left;5 reps;Supine Hip ABduction/ADduction: AAROM;Right;5 reps;Supine Hip Flexion/Marching: AAROM;Right;5 reps;Supine    General Comments        Pertinent Vitals/Pain Pain Assessment: Faces  Faces Pain Scale: Hurts whole lot Pain Location: R AKA Pain Descriptors / Indicators: Grimacing;Operative site guarding;Moaning;Guarding Pain Intervention(s): Limited activity within patient's tolerance;Monitored during session;Repositioned;Relaxation    Home Living                      Prior Function            PT Goals (current goals can now be found in the care plan section) Acute Rehab PT Goals Patient Stated Goal: to return home and cook PT Goal Formulation: With patient Time For Goal Achievement: 03/24/19 Potential to Achieve Goals: Fair Progress towards PT goals: Progressing toward goals    Frequency    Min  3X/week      PT Plan Current plan remains appropriate(pain limiting this date; difficult to assess)    Co-evaluation              AM-PAC PT "6 Clicks" Mobility   Outcome Measure  Help needed turning from your back to your side while in a flat bed without using bedrails?: A Lot Help needed moving from lying on your back to sitting on the side of a flat bed without using bedrails?: Total Help needed moving to and from a bed to a chair (including a wheelchair)?: Total Help needed standing up from a chair using your arms (e.g., wheelchair or bedside chair)?: Total Help needed to walk in hospital room?: Total Help needed climbing 3-5 steps with a railing? : Total 6 Click Score: 7    End of Session   Activity Tolerance: Patient limited by pain Patient left: with call bell/phone within reach;in bed;with bed alarm set;with SCD's reapplied   PT Visit Diagnosis: Pain;Difficulty in walking, not elsewhere classified (R26.2);History of falling (Z91.81);Muscle weakness (generalized) (M62.81) Pain - Right/Left: Right Pain - part of body: Leg     Time: 1052-1130 PT Time Calculation (min) (ACUTE ONLY): 38 min  Charges:  $Therapeutic Exercise: 23-37 mins $Therapeutic Activity: 8-22 mins                      Veda Canning, PT Pager [phone]    Zena Amos 03/15/2019, 1:05 PM

## 2019-03-15 NOTE — Progress Notes (Signed)
PROGRESS NOTE    Robin Snow  LOV:[id number] DOB: 06/24/1940 DOA: 02/22/2019 PCP: [name], MD   Brief Narrative:  HPI on 02/22/2019 by Dr. Madelyn Flavors Robin Snow is a 78 y.o. female with medical history significant of paroxysmal atrial fibrillation on anticoagulation Coumadin, hypertension, diabetes mellitus type II, and chronic kidney disease stage III.  She presents with complaints right knee pain and swelling. Hospitalized from 10/29-11/4 with sepsis related to E. coli bacteremia, UTI, and bacterial arthritis right knee.  Treated with Rocephin, then switched to Ancef, and discharged home to on Keflex to complete a 10-day course.  Patient had initially done okay when she got home and had completed antibiotics as prescribed.  3 days ago while the daughter was helping her transition she had heard a loud pop in the right knee.  Since that time she has had increasing pain and swelling of the right knee to the point in which she started developing fluid-filled blisters on her skin.  Associated symptoms include increased warmth, but the patient has not had any fever.  She had gone from being unable to bear weight on the right knee and now cannot have anyone touch it without crying out in pain.  Denies any significant nausea, vomiting, cough, chest pain, shortness of breath, diarrhea, blood in stools, or dysuria symptoms.  Interim history Patient presented with prosthetic dislocation abscess, status post closed reduction and drainage on 02/22/2019.  Currently being treated for E. coli bacteremia.  Patient underwent right AKA on 03/01/2019 and was intubated after the procedure for respiratory failure.  While in the ICU patient did have positive bacteremia and TEE TEE was concerning for endocarditis.  ID consulted, patient will need antibiotics through 04/12/2019.  Patient also noted to have postop bleeding overnight and received 1 unit PRBC.  Currently pending inpatient rehab.   Assessment & Plan   Sepsis secondary to endocarditis, E. coli bacteremia in the context of infected knee hardware -Orthopedic surgery consulted and appreciated, status post right AKA -TEE completed on 03/08/2019 with evidence of mobile mass and thickening of the aortic valve suspicious for aortic valvular vegetation  -Infectious disease consulted and appreciated recommended 6 weeks of Rocephin with a tentative stop date of 04/12/2019, PICC line placed -ID did not advise removal of pacemaker at this time -Pending orthopedic recommendations regarding possible hip disarticulation in setting of muscle necrosis  Acute onset of acute blood loss anemia -Patient did have a hemoglobin drop of 5.9 and did receive 2 units of PRBC on 03/14/2019 -hemoglobin currently 8.5 -Heparin held -Continue iron supplementation -Monitor CBC  Postoperative respiratory failure requiring mechanical ventilation -Resolved -In the setting of E. coli bacteremia and recent AKA with significant tach and -Patient was extubated on 03/09/2019 -Continue incentive spirometry -Currently on room air and maintaining oxygen saturations into the 90s  Atrial fibrillation/sick sinus syndrome -Status post pacemaker -Patient did require amiodarone drip, however now is rate controlled -Home coagulation of Coumadin is now been held given her postop anemia -Continue amiodarone, diltiazem, metoprolol  Protein calorie malnutrition -Continue nutrition supplements  Electrolyte abnormalities -Resolving, monitor labs and replace as needed  DVT Prophylaxis  SCDs  Code Status: Full  Family Communication: None at bedside  Disposition Plan: Admitted. Pending auth for CIR  Consultants Orthopedic surgery Infectious disease PCCM  Procedures  Closed reduction right knee process attic, drainage Right AKA Intubation/extubation  Antibiotics   Anti-infectives (From admission, onward)   Start     Dose/Rate Route Frequency Ordered Stop  03/02/19 0600  ceFAZolin (ANCEF) IVPB 2g/100 mL premix     2 g 200 mL/hr over 30 Minutes Intravenous On call to O.R. 03/01/19 1304 03/01/19 1526   02/23/19 0345  cefTRIAXone (ROCEPHIN) 2 g in sodium chloride 0.9 % 100 mL IVPB     2 g 200 mL/hr over 30 Minutes Intravenous Daily at bedtime 02/23/19 0336 04/12/19 2359      Subjective:   Robin Snow seen and examined today.  Patient with no complaints this morning.  Denies current chest pain or shortness of breath, abdominal pain, nausea vomiting, diarrhea constipation, dizziness or headache.  Objective:   Vitals:   03/14/19 2028 03/15/19 0020 03/15/19 0340 03/15/19 0800  BP: 126/74 (!) 152/88 124/72 (!) 165/120  Pulse: 72 87 86 67  Resp: 16 15 18 17   Temp: 99.1 F (37.3 C) (!) 97.4 F (36.3 C) 98.9 F (37.2 C) 99.1 F (37.3 C)  TempSrc: Oral Oral Oral Oral  SpO2: (!) 78% 100% 96% 98%  Weight:      Height:        Intake/Output Summary (Last 24 hours) at 03/15/2019 1358 Last data filed at 03/15/2019 0300 Gross per 24 hour  Intake 369.77 ml  Output 600 ml  Net -230.23 ml   Filed Weights   03/12/19 0449 03/13/19 0500 03/14/19 0445  Weight: 102.4 kg 103 kg 99.9 kg    Exam  General: Well developed, chronically ill-appearing, NAD  HEENT: NCAT, mucous membranes moist.   Cardiovascular: S1 S2 auscultated, irregular  Respiratory: Clear to auscultation bilaterally with equal chest rise  Abdomen: Soft, nontender, nondistended, + bowel sounds  Extremities: Right AKA with dressing in place  Neuro: AAOx3, nonfocal  Psych: Appropriate mood and affect   Data Reviewed: I have personally reviewed following labs and imaging studies  CBC: Recent Labs  Lab 03/09/19 0500  03/11/19 0358 03/11/19 0659  03/12/19 0525 03/13/19 0641 03/14/19 0444 03/14/19 1438 03/15/19 0425  WBC 5.7   < > 15.4* 16.3*  --  13.3* 9.3 6.1  --  5.8  NEUTROABS 3.5  --  10.9* 11.4*  --  8.7* 5.4  --   --   --   HGB 7.5*   < > 6.0* 5.8*   <  > 7.1* 7.0* 5.9* 9.1* 8.5*  HCT 24.5*   < > 19.4* 18.9*   < > 22.1* 22.5* 19.1* 27.2* 27.1*  MCV 96.8   < > 97.5 99.0  --  94.0 97.0 99.0  --  95.8  PLT 195   < > 199 198  --  148* 175 145*  --  181   < > = values in this interval not displayed.   Basic Metabolic Panel: Recent Labs  Lab 03/09/19 0500 03/10/19 0500 03/11/19 0358 03/12/19 0525 03/13/19 0641 03/14/19 0444  NA 148* 142 143 137 139 137  K 3.2* 3.3* 4.0 4.4 4.9 4.3  CL 107 106 108 102 104 101  CO2 30 27 29 27 26 27   GLUCOSE 112* 90 121* 141* 97 90  BUN 30* 21 17 21 16 13   CREATININE 0.86 0.81 0.85 0.90 0.93 0.80  CALCIUM 8.1* 8.1* 7.8* 7.7* 7.9* 7.9*  MG  --   --   --   --   --  1.8  PHOS 2.8  --   --   --   --   --    GFR: Estimated Creatinine Clearance: 69.2 mL/min (by C-G formula based on SCr of 0.8 mg/dL). Liver Function  Tests: Recent Labs  Lab 03/13/19 0641  AST 30  ALT 19  ALKPHOS 70  BILITOT 0.6  PROT 4.8*  ALBUMIN 1.8*   No results for input(s): LIPASE, AMYLASE in the last 168 hours. No results for input(s): AMMONIA in the last 168 hours. Coagulation Profile: No results for input(s): INR, PROTIME in the last 168 hours. Cardiac Enzymes: No results for input(s): CKTOTAL, CKMB, CKMBINDEX, TROPONINI in the last 168 hours. BNP (last 3 results) No results for input(s): PROBNP in the last 8760 hours. HbA1C: No results for input(s): HGBA1C in the last 72 hours. CBG: Recent Labs  Lab 03/14/19 0303 03/14/19 0745 03/14/19 1122 03/14/19 1535 03/14/19 1934  GLUCAP 83 79 86 85 99   Lipid Profile: No results for input(s): CHOL, HDL, LDLCALC, TRIG, CHOLHDL, LDLDIRECT in the last 72 hours. Thyroid Function Tests: No results for input(s): TSH, T4TOTAL, FREET4, T3FREE, THYROIDAB in the last 72 hours. Anemia Panel: No results for input(s): VITAMINB12, FOLATE, FERRITIN, TIBC, IRON, RETICCTPCT in the last 72 hours. Urine analysis:    Component Value Date/Time   COLORURINE YELLOW 02/02/2019 1923    APPEARANCEUR CLEAR 02/02/2019 1923   LABSPEC 1.013 02/02/2019 1923   PHURINE 5.0 02/02/2019 1923   GLUCOSEU NEGATIVE 02/02/2019 1923   GLUCOSEU NEGATIVE 09/06/2017 1200   HGBUR SMALL (A) 02/02/2019 1923   BILIRUBINUR NEGATIVE 02/02/2019 1923   BILIRUBINUR Neg 10/18/2017 1453   KETONESUR NEGATIVE 02/02/2019 1923   PROTEINUR NEGATIVE 02/02/2019 1923   UROBILINOGEN 0.2 10/18/2017 1453   UROBILINOGEN 1.0 09/06/2017 1200   NITRITE POSITIVE (A) 02/02/2019 1923   LEUKOCYTESUR LARGE (A) 02/02/2019 1923   Sepsis Labs: @LABRCNTIP (procalcitonin:4,lacticidven:4)  )No results found for this or any previous visit (from the past 240 hour(s)).    Radiology Studies: No results found.   Scheduled Meds: . amiodarone  200 mg Oral Daily  . chlorhexidine  15 mL Mouth Rinse BID  . chlorhexidine gluconate (MEDLINE KIT)  15 mL Mouth Rinse BID  . Chlorhexidine Gluconate Cloth  6 each Topical Daily  . cholecalciferol  2,000 Units Oral BID  . diltiazem  120 mg Oral Daily  . feeding supplement (ENSURE ENLIVE)  237 mL Oral TID BM  . ferrous sulfate  300 mg Oral BID WC  . gabapentin  100 mg Oral TID  . Gerhardt's butt cream   Topical TID  . mouth rinse  15 mL Mouth Rinse q12n4p  . metoprolol tartrate  50 mg Oral BID  . multivitamin with minerals  1 tablet Oral Daily  . pantoprazole  40 mg Oral Daily  . pravastatin  20 mg Oral q1800  . senna-docusate  2 tablet Oral BID  . sodium chloride flush  10-40 mL Intracatheter Q12H  . sodium chloride flush  3 mL Intravenous Q12H  . [START ON 03/16/2019] Vitamin D (Ergocalciferol)  50,000 Units Oral Q7 days   Continuous Infusions: . sodium chloride 20 mL/hr at 03/14/19 2315  . sodium chloride Stopped (03/14/19 1958)  . cefTRIAXone (ROCEPHIN)  IV Stopped (03/14/19 2219)     LOS: 20 days   Time Spent in minutes   30 minutes  Jafari Mckillop D.O. on 03/15/2019 at 1:58 PM  Between 7am to 7pm - Please see pager noted on [email or web address]  After 7pm go to  [email or web address]  And look for the night coverage person covering for me after hours  [facility] Office  [phone]

## 2019-03-15 NOTE — Progress Notes (Signed)
Comfortable . VSS post transfusion HGB 8.5. Dressing CDI. When medically cleared may transfer to CIR or SNF. Follow up [name] 1 week

## 2019-03-15 NOTE — Progress Notes (Signed)
Nutrition Follow-up  RD working remotely.  DOCUMENTATION CODES:   Obesity unspecified  INTERVENTION:   -Continue MVI with minerals daily -Increase Ensure Enlive po to TID, each supplement provides 350 kcal and 20 grams of protein -Magic cup TID with meals, each supplement provides 290 kcal and 9 grams of protein  NUTRITION DIAGNOSIS:   Increased nutrient needs related to wound healing as evidenced by estimated needs.  Ongoing  GOAL:   Patient will meet greater than or equal to 90% of their needs  Progressing  MONITOR:   PO intake, Supplement acceptance, Labs, Weight trends, Skin  REASON FOR ASSESSMENT:   Consult, Ventilator Enteral/tube feeding initiation and management  ASSESSMENT:   Pt with PMH of DM, HTN, morbid obesity, and GER who had R knee replacement s/p revision in 2018 now admitted with prosthetic dislocation and abscess s/p closed reduction and drainage 11/18 treating for e.coli bacteremia now s/p R AKA 11/25 with wound VAC.  11/28 trickle TF started 12/2 TEE 12/3 extubated 12/4 diet advanced (dysphagia 3 diet with thin liquids) 12/8- wound vac d/c by orthopedics  Reviewed I/O's: +563 ml x 24 hours and +9 L since 03/01/19  UOP: 700 ml x 24 hours  Drain output: 0 ml x 24 hours  Pt with poor oral intake; noted meal completion 20-90% (avergaing around 25%). Pt has been consuming Ensure supplements per MAR, prefers chocolate and strawberry flavors.   Per orthopedics notes, pt medically stable for discharge. Pt and family hopeful for CIR admission; CIR admissions coordinator started insurance authorization.  Labs reviewed: CBGS: 85-99.   Diet Order:   Diet Order            Diet Heart Room service appropriate? Yes; Fluid consistency: Thin  Diet effective now              EDUCATION NEEDS:   No education needs have been identified at this time  Skin:  Skin Assessment: Skin Integrity Issues: Skin Integrity Issues:: Stage II, Incisions Stage  II: buttocks, R thigh Incisions: rt AKA, wound vac d/c on 12/8  Last BM:  03/15/19  Height:   Ht Readings from Last 1 Encounters:  03/01/19 5\' 7"  (1.702 m)    Weight:   Wt Readings from Last 1 Encounters:  03/14/19 99.9 kg    Ideal Body Weight:  56.3 kg(adjusted for R AKA)  BMI:  Body mass index is 34.49 kg/m.  Estimated Nutritional Needs:   Kcal:  1900-2200  Protein:  115-130 grams  Fluid:  > 1.9 L/day    Robin Snow, RD, LDN, [place] Registered Dietitian II Certified Diabetes Care and Education Specialist Pager: [phone] After hours Pager: [phone]

## 2019-03-15 NOTE — Plan of Care (Signed)
  Problem: Health Behavior/Discharge Planning: Goal: Ability to manage health-related needs will improve Outcome: Progressing   Problem: Activity: Goal: Risk for activity intolerance will decrease Outcome: Progressing   Problem: Pain Managment: Goal: General experience of comfort will improve Outcome: Progressing   

## 2019-03-16 LAB — GLUCOSE, CAPILLARY
Glucose-Capillary: 105 mg/dL — ABNORMAL HIGH (ref 70–99)
Glucose-Capillary: 110 mg/dL — ABNORMAL HIGH (ref 70–99)
Glucose-Capillary: 92 mg/dL (ref 70–99)
Glucose-Capillary: 98 mg/dL (ref 70–99)
Glucose-Capillary: 99 mg/dL (ref 70–99)

## 2019-03-16 LAB — CBC
HCT: 28.1 % — ABNORMAL LOW (ref 36.0–46.0)
Hemoglobin: 8.8 g/dL — ABNORMAL LOW (ref 12.0–15.0)
MCH: 29.9 pg (ref 26.0–34.0)
MCHC: 31.3 g/dL (ref 30.0–36.0)
MCV: 95.6 fL (ref 80.0–100.0)
Platelets: 197 10*3/uL (ref 150–400)
RBC: 2.94 MIL/uL — ABNORMAL LOW (ref 3.87–5.11)
RDW: 19.9 % — ABNORMAL HIGH (ref 11.5–15.5)
WBC: 4.8 10*3/uL (ref 4.0–10.5)
nRBC: 0 % (ref 0.0–0.2)

## 2019-03-16 MED ORDER — HEPARIN (PORCINE) 25000 UT/250ML-% IV SOLN
1250.0000 [IU]/h | INTRAVENOUS | Status: DC
  Administered 2019-03-16: 1100 [IU]/h via INTRAVENOUS
  Administered 2019-03-17 – 2019-03-18 (×2): 1300 [IU]/h via INTRAVENOUS
  Administered 2019-03-18: 1500 [IU]/h via INTRAVENOUS
  Administered 2019-03-19: 1250 [IU]/h via INTRAVENOUS
  Filled 2019-03-16 (×5): qty 250

## 2019-03-16 NOTE — Progress Notes (Signed)
Inpatient Rehabilitation-Admissions Coordinator   Pt's insurance has requested a peer to peer with the attending physician in order to make a final determination regarding the request for CIR.   Dr. Ree Kida has agreed to complete the peer to peer. I have set up the conference for tomorrow.   Will update once there has been a final determination.   Jhonnie Garner, OTR/L  Rehab Admissions Coordinator  [phone] 03/16/2019 5:12 PM

## 2019-03-16 NOTE — Care Management Important Message (Signed)
Important Message  Patient Details  Name: Robin Snow MRN: [id number] Date of Birth: [birth year]   Medicare Important Message Given:  Yes     Memory Argue 03/16/2019, 3:10 PM

## 2019-03-16 NOTE — Progress Notes (Signed)
Occupational Therapy Treatment Patient Details Name: Robin Snow MRN: [id number] DOB: May 06, 1940 Today's Date: 03/16/2019    History of present illness 78  y/o female with history of atrial fibrillation, HTN, DM,CKD III had a fall at home when patient was trying to get out of the bed. Hx of R knee pain and swelling for the last few months. Found with sepsis secondary to E. coli bacteremia. Hospitalized from 10/29-11/4 with sepsis related to E. coli bacteremia, UTI, and bacterial arthritis right knee. Found to have dislocated right knee prothestic--02/22/19 s/p Closed reduction of right knee prosthetic dislocation. 11/25 underwent R AKA intubated with respiratory failure extubation 03/09/19   OT comments  Pain much improved today. Immediately willing to work with OT. Focus of session on grooming, UB dressing, supine to sit using bed rails and sitting balance. Pt with increasing endurance. Needing assistance to place lunch order, no family available to determine prior cognitive functioning.  Follow Up Recommendations  CIR    Equipment Recommendations  Wheelchair (measurements OT);Wheelchair cushion (measurements OT);Hospital bed    Recommendations for Other Services      Precautions / Restrictions Precautions Precautions: Fall Restrictions Weight Bearing Restrictions: Yes RLE Weight Bearing: Non weight bearing       Mobility Bed Mobility Overal bed mobility: Needs Assistance Bed Mobility: Supine to Sit;Sit to Supine           General bed mobility comments: worked on pulling to sit with B bed rails from increasingly lower HOB, performed x 7  Transfers                      Balance Overall balance assessment: Needs assistance Sitting-balance support: Bilateral upper extremity supported;Feet supported Sitting balance-Leahy Scale: Poor   Postural control: Posterior lean                                 ADL either performed or assessed with clinical  judgement   ADL Overall ADL's : Needs assistance/impaired     Grooming: Wash/dry hands;Wash/dry face;Bed level           Upper Body Dressing : Minimal assistance;Bed level                           Vision   Vision Assessment?: No apparent visual deficits Additional Comments: pt able to read menu and choose her lunch, but not able to place call to order   Perception     Praxis      Cognition Arousal/Alertness: Awake/alert Behavior During Therapy: [facility] for tasks assessed/performed Overall Cognitive Status: No family/caregiver present to determine baseline cognitive functioning Area of Impairment: Attention;Memory;Problem solving                   Current Attention Level: Selective Memory: Decreased short-term memory       Problem Solving: Slow processing;Decreased initiation;Requires verbal cues;Difficulty sequencing;Requires tactile cues General Comments: verbal cues to initiate, decreased task persistence        Exercises     Shoulder Instructions       General Comments      Pertinent Vitals/ Pain       Pain Assessment: Faces Faces Pain Scale: No hurt  Home Living  Prior Functioning/Environment              Frequency  Min 2X/week        Progress Toward Goals  OT Goals(current goals can now be found in the care plan section)  Progress towards OT goals: Progressing toward goals  Acute Rehab OT Goals Patient Stated Goal: to return home and cook OT Goal Formulation: With patient/family Time For Goal Achievement: 03/24/19 Potential to Achieve Goals: Good  Plan Discharge plan remains appropriate    Co-evaluation                 AM-PAC OT "6 Clicks" Daily Activity     Outcome Measure   Help from another person eating meals?: A Little Help from another person taking care of personal grooming?: A Little Help from another person toileting, which includes  using toliet, bedpan, or urinal?: Total Help from another person bathing (including washing, rinsing, drying)?: A Lot Help from another person to put on and taking off regular upper body clothing?: A Lot Help from another person to put on and taking off regular lower body clothing?: Total 6 Click Score: 12    End of Session    OT Visit Diagnosis: Unsteadiness on feet (R26.81);Muscle weakness (generalized) (M62.81);Pain   Activity Tolerance Patient tolerated treatment well   Patient Left in bed;with call bell/phone within reach   Nurse Communication Other (comment)(helped pt order lunch)        Time: 7510-2585 OT Time Calculation (min): 29 min  Charges: OT General Charges $OT Visit: 1 Visit OT Treatments $Self Care/Home Management : 8-22 mins $Therapeutic Activity: 8-22 mins  Nestor Lewandowsky, OTR/L Acute Rehabilitation Services Pager: [phone] Office: [phone]   Malka So 03/16/2019, 12:13 PM

## 2019-03-16 NOTE — Progress Notes (Signed)
PROGRESS NOTE    Robin Snow  LKG:[id number] DOB: October 27, 1940 DOA: 02/22/2019 PCP: [name], MD   Brief Narrative:  HPI on 02/22/2019 by Dr. Madelyn Flavors Robin Snow is a 78 y.o. female with medical history significant of paroxysmal atrial fibrillation on anticoagulation Coumadin, hypertension, diabetes mellitus type II, and chronic kidney disease stage III.  She presents with complaints right knee pain and swelling. Hospitalized from 10/29-11/4 with sepsis related to E. coli bacteremia, UTI, and bacterial arthritis right knee.  Treated with Rocephin, then switched to Ancef, and discharged home to on Keflex to complete a 10-day course.  Patient had initially done okay when she got home and had completed antibiotics as prescribed.  3 days ago while the daughter was helping her transition she had heard a loud pop in the right knee.  Since that time she has had increasing pain and swelling of the right knee to the point in which she started developing fluid-filled blisters on her skin.  Associated symptoms include increased warmth, but the patient has not had any fever.  She had gone from being unable to bear weight on the right knee and now cannot have anyone touch it without crying out in pain.  Denies any significant nausea, vomiting, cough, chest pain, shortness of breath, diarrhea, blood in stools, or dysuria symptoms.  Interim history Patient presented with prosthetic dislocation abscess, status post closed reduction and drainage on 02/22/2019.  Currently being treated for E. coli bacteremia.  Patient underwent right AKA on 03/01/2019 and was intubated after the procedure for respiratory failure.  While in the ICU patient did have positive bacteremia and TEE TEE was concerning for endocarditis.  ID consulted, patient will need antibiotics through 04/12/2019.  Patient also noted to have postop bleeding overnight and received 1 unit PRBC.  Currently pending inpatient rehab.   Assessment & Plan   Sepsis secondary to endocarditis, E. coli bacteremia in the context of infected knee hardware -sepsis present on admission -Orthopedic surgery consulted and appreciated, status post right AKA -TEE completed on 03/08/2019 with evidence of mobile mass and thickening of the aortic valve suspicious for aortic valvular vegetation  -Infectious disease consulted and appreciated recommended 6 weeks of Rocephin with a tentative stop date of 04/12/2019, PICC line placed -ID did not advise removal of pacemaker at this time -Pending orthopedic recommendations regarding possible hip disarticulation in setting of muscle necrosis-discussed with orthopedics, Dr. Lajoyce Corners, would continue wound VAC for an additional week and have her follow-up as an outpatient.  No further surgery needed at this time.  Acute onset of acute blood loss anemia -Patient did have a hemoglobin drop of 5.9 and did receive 2 units of PRBC on 03/14/2019 -hemoglobin currently 8.8 -Will restart heparin and monitor closely  -Continue iron supplementation -Monitor CBC  Postoperative respiratory failure requiring mechanical ventilation -Resolved -In the setting of E. coli bacteremia and recent AKA with significant tach and -Patient was extubated on 03/09/2019 -Continue incentive spirometry -Currently on room air and maintaining oxygen saturations into the 90s  Atrial fibrillation/sick sinus syndrome -Status post pacemaker -Patient did require amiodarone drip, however now is rate controlled -Home anticoagulation of Coumadin held given her postop anemia -Continue amiodarone, diltiazem, metoprolol -will restart heparin and monitor closely  Protein calorie malnutrition -Continue nutrition supplements  Electrolyte abnormalities -Resolving, monitor labs and replace as needed  DVT Prophylaxis  SCDs--> heparin  Code Status: Full  Family Communication: None at bedside  Disposition Plan: Admitted. Pending auth for CIR  Consultants Orthopedic surgery Infectious disease PCCM  Procedures  Closed reduction right knee process attic, drainage Right AKA Intubation/extubation  Antibiotics   Anti-infectives (From admission, onward)   Start     Dose/Rate Route Frequency Ordered Stop   03/02/19 0600  ceFAZolin (ANCEF) IVPB 2g/100 mL premix     2 g 200 mL/hr over 30 Minutes Intravenous On call to O.R. 03/01/19 1304 03/01/19 1526   02/23/19 0345  cefTRIAXone (ROCEPHIN) 2 g in sodium chloride 0.9 % 100 mL IVPB     2 g 200 mL/hr over 30 Minutes Intravenous Daily at bedtime 02/23/19 0336 04/12/19 2359      Subjective:   Robin Snow seen and examined today.  Patient with no complaints today. Denies current chest pain, shortness of breath, abdominal pain, headache, dizziness.   Objective:   Vitals:   03/15/19 1533 03/15/19 1943 03/16/19 0252 03/16/19 0600  BP: 123/71 117/68 (!) 141/91   Pulse: 85 86 100   Resp: 17     Temp: 98.8 F (37.1 C) 98.6 F (37 C) 99 F (37.2 C)   TempSrc: Oral Oral Oral   SpO2: 98% 98% 95%   Weight:    103 kg  Height:        Intake/Output Summary (Last 24 hours) at 03/16/2019 1316 Last data filed at 03/15/2019 1700 Gross per 24 hour  Intake 0 ml  Output 650 ml  Net -650 ml   Filed Weights   03/13/19 0500 03/14/19 0445 03/16/19 0600  Weight: 103 kg 99.9 kg 103 kg   Exam  General: Well developed, chronically ill appearing, NAD  HEENT: NCAT,  mucous membranes moist.   Cardiovascular: S1 S2 auscultated, irregular  Respiratory: Clear to auscultation bilaterally  Abdomen: Soft, nontender, nondistended, + bowel sounds  Extremities: warm dry without cyanosis clubbing or edema  Neuro: AAOx3, nonfocal  Psych: Appropriate mood and affect  Data Reviewed: I have personally reviewed following labs and imaging studies  CBC: Recent Labs  Lab 03/11/19 0358 03/11/19 0659 03/12/19 0525 03/13/19 0641 03/14/19 0444 03/14/19 1438 03/15/19 0425 03/16/19 0517   WBC 15.4* 16.3* 13.3* 9.3 6.1  --  5.8 4.8  NEUTROABS 10.9* 11.4* 8.7* 5.4  --   --   --   --   HGB 6.0* 5.8* 7.1* 7.0* 5.9* 9.1* 8.5* 8.8*  HCT 19.4* 18.9* 22.1* 22.5* 19.1* 27.2* 27.1* 28.1*  MCV 97.5 99.0 94.0 97.0 99.0  --  95.8 95.6  PLT 199 198 148* 175 145*  --  181 197   Basic Metabolic Panel: Recent Labs  Lab 03/10/19 0500 03/11/19 0358 03/12/19 0525 03/13/19 0641 03/14/19 0444  NA 142 143 137 139 137  K 3.3* 4.0 4.4 4.9 4.3  CL 106 108 102 104 101  CO2 27 29 27 26 27   GLUCOSE 90 121* 141* 97 90  BUN 21 17 21 16 13   CREATININE 0.81 0.85 0.90 0.93 0.80  CALCIUM 8.1* 7.8* 7.7* 7.9* 7.9*  MG  --   --   --   --  1.8   GFR: Estimated Creatinine Clearance: 70.4 mL/min (by C-G formula based on SCr of 0.8 mg/dL). Liver Function Tests: Recent Labs  Lab 03/13/19 0641  AST 30  ALT 19  ALKPHOS 70  BILITOT 0.6  PROT 4.8*  ALBUMIN 1.8*   No results for input(s): LIPASE, AMYLASE in the last 168 hours. No results for input(s): AMMONIA in the last 168 hours. Coagulation Profile: No results for input(s): INR, PROTIME in the last  168 hours. Cardiac Enzymes: No results for input(s): CKTOTAL, CKMB, CKMBINDEX, TROPONINI in the last 168 hours. BNP (last 3 results) No results for input(s): PROBNP in the last 8760 hours. HbA1C: No results for input(s): HGBA1C in the last 72 hours. CBG: Recent Labs  Lab 03/14/19 1535 03/14/19 1934 03/16/19 0044 03/16/19 0521 03/16/19 1227  GLUCAP 85 99 105* 92 98   Lipid Profile: No results for input(s): CHOL, HDL, LDLCALC, TRIG, CHOLHDL, LDLDIRECT in the last 72 hours. Thyroid Function Tests: No results for input(s): TSH, T4TOTAL, FREET4, T3FREE, THYROIDAB in the last 72 hours. Anemia Panel: No results for input(s): VITAMINB12, FOLATE, FERRITIN, TIBC, IRON, RETICCTPCT in the last 72 hours. Urine analysis:    Component Value Date/Time   COLORURINE YELLOW 02/02/2019 1923   APPEARANCEUR CLEAR 02/02/2019 1923   LABSPEC 1.013  02/02/2019 1923   PHURINE 5.0 02/02/2019 1923   GLUCOSEU NEGATIVE 02/02/2019 1923   GLUCOSEU NEGATIVE 09/06/2017 1200   HGBUR SMALL (A) 02/02/2019 1923   BILIRUBINUR NEGATIVE 02/02/2019 1923   BILIRUBINUR Neg 10/18/2017 1453   KETONESUR NEGATIVE 02/02/2019 1923   PROTEINUR NEGATIVE 02/02/2019 1923   UROBILINOGEN 0.2 10/18/2017 1453   UROBILINOGEN 1.0 09/06/2017 1200   NITRITE POSITIVE (A) 02/02/2019 1923   LEUKOCYTESUR LARGE (A) 02/02/2019 1923   Sepsis Labs: @LABRCNTIP (procalcitonin:4,lacticidven:4)  )No results found for this or any previous visit (from the past 240 hour(s)).    Radiology Studies: No results found.   Scheduled Meds: . amiodarone  200 mg Oral Daily  . chlorhexidine  15 mL Mouth Rinse BID  . chlorhexidine gluconate (MEDLINE KIT)  15 mL Mouth Rinse BID  . Chlorhexidine Gluconate Cloth  6 each Topical Daily  . cholecalciferol  2,000 Units Oral BID  . diltiazem  120 mg Oral Daily  . feeding supplement (ENSURE ENLIVE)  237 mL Oral TID BM  . ferrous sulfate  300 mg Oral BID WC  . gabapentin  100 mg Oral TID  . Gerhardt's butt cream   Topical TID  . mouth rinse  15 mL Mouth Rinse q12n4p  . metoprolol tartrate  50 mg Oral BID  . multivitamin with minerals  1 tablet Oral Daily  . pantoprazole  40 mg Oral Daily  . pravastatin  20 mg Oral q1800  . senna-docusate  2 tablet Oral BID  . sodium chloride flush  10-40 mL Intracatheter Q12H  . sodium chloride flush  3 mL Intravenous Q12H  . Vitamin D (Ergocalciferol)  50,000 Units Oral Q7 days   Continuous Infusions: . sodium chloride 20 mL/hr at 03/14/19 2315  . sodium chloride Stopped (03/14/19 1958)  . cefTRIAXone (ROCEPHIN)  IV 2 g (03/15/19 2124)     LOS: 21 days   Time Spent in minutes   30 minutes  Zarion Oliff D.O. on 03/16/2019 at 1:16 PM  Between 7am to 7pm - Please see pager noted on [email or web address]  After 7pm go to [email or web address]  And look for the night coverage person covering for me after hours   [facility] Office  [phone]

## 2019-03-16 NOTE — Plan of Care (Signed)
  Problem: Health Behavior/Discharge Planning: Goal: Ability to manage health-related needs will improve Outcome: Progressing   Problem: Activity: Goal: Risk for activity intolerance will decrease Outcome: Progressing   Problem: Pain Managment: Goal: General experience of comfort will improve Outcome: Progressing   Problem: Skin Integrity: Goal: Risk for impaired skin integrity will decrease Outcome: Progressing   

## 2019-03-16 NOTE — Progress Notes (Signed)
ANTICOAGULATION CONSULT NOTE - Follow Up Consult  Pharmacy Consult for Heparin Indication: atrial fibrillation  Allergies  Allergen Reactions  . Aspirin Hives and Swelling    Angioedema   . Rofecoxib Hives  . Hydrocodone Hives    Tolerates oxycodone and tramadol    Patient Measurements: Height: 5\' 7"  (170.2 cm) Weight: 227 lb 1.2 oz (103 kg) IBW/kg (Calculated) : 61.6 Heparin dosing weight = 83 kg  Vital Signs: Temp: 99 F (37.2 C) (12/10 0252) Temp Source: Oral (12/10 0252) BP: 141/91 (12/10 0252) Pulse Rate: 100 (12/10 0252)  Labs: Recent Labs    03/14/19 0444 03/14/19 1438 03/15/19 0425 03/16/19 0517  HGB 5.9* 9.1* 8.5* 8.8*  HCT 19.1* 27.2* 27.1* 28.1*  PLT 145*  --  181 197  CREATININE 0.80  --   --   --     Estimated Creatinine Clearance: 70.4 mL/min (by C-G formula based on SCr of 0.8 mg/dL).  Assessment: 78 year old female to restart heparin after being held for drop in HgB On warfarin prior to admission for Afib  Goal of Therapy:  Heparin level 0.3-0.7 units/ml Monitor platelets by anticoagulation protocol: Yes   Plan:  Restart heparin at 1100 units / hr Heparin level in 8 hours Daily heparin level, CBC  Thank you Anette Guarneri, PharmD  Tad Moore 03/16/2019,1:33 PM

## 2019-03-17 LAB — BASIC METABOLIC PANEL
Anion gap: 8 (ref 5–15)
BUN: 14 mg/dL (ref 8–23)
CO2: 27 mmol/L (ref 22–32)
Calcium: 7.7 mg/dL — ABNORMAL LOW (ref 8.9–10.3)
Chloride: 101 mmol/L (ref 98–111)
Creatinine, Ser: 0.93 mg/dL (ref 0.44–1.00)
GFR calc Af Amer: >60 mL/min (ref >60)
GFR calc non Af Amer: 58 mL/min — ABNORMAL LOW (ref >60)
Glucose, Bld: 120 mg/dL — ABNORMAL HIGH (ref 70–99)
Potassium: 4.3 mmol/L (ref 3.5–5.1)
Sodium: 136 mmol/L (ref 135–145)

## 2019-03-17 LAB — GLUCOSE, CAPILLARY
Glucose-Capillary: 103 mg/dL — ABNORMAL HIGH (ref 70–99)
Glucose-Capillary: 111 mg/dL — ABNORMAL HIGH (ref 70–99)
Glucose-Capillary: 130 mg/dL — ABNORMAL HIGH (ref 70–99)
Glucose-Capillary: 118 mg/dL — ABNORMAL HIGH (ref 70–99)

## 2019-03-17 LAB — CBC
HCT: 27.4 % — ABNORMAL LOW (ref 36.0–46.0)
Hemoglobin: 8.7 g/dL — ABNORMAL LOW (ref 12.0–15.0)
MCH: 29.8 pg (ref 26.0–34.0)
MCHC: 31.8 g/dL (ref 30.0–36.0)
MCV: 93.8 fL (ref 80.0–100.0)
Platelets: 176 10*3/uL (ref 150–400)
RBC: 2.92 MIL/uL — ABNORMAL LOW (ref 3.87–5.11)
RDW: 19.5 % — ABNORMAL HIGH (ref 11.5–15.5)
WBC: 4.7 10*3/uL (ref 4.0–10.5)
nRBC: 0.4 % — ABNORMAL HIGH (ref 0.0–0.2)

## 2019-03-17 LAB — HEPARIN LEVEL (UNFRACTIONATED)
Heparin Unfractionated: 0.21 IU/mL — ABNORMAL LOW (ref 0.30–0.70)
Heparin Unfractionated: 0.25 IU/mL — ABNORMAL LOW (ref 0.30–0.70)

## 2019-03-17 NOTE — Progress Notes (Addendum)
Inpatient Rehabilitation Admissions Coordinator  Insurance has denied approval for an inpt rehab admit after peer to peer appeal with [facility] MD with Dr. Ree Kida. I met with patient at bedside and she is aware. I have alerted SW, Shanita. I contacted pt's daughter, Janett Billow and she is aware of the denial.We will sign off at this time.  Danne Baxter, RN, MSN Rehab Admissions Coordinator [phone] 03/17/2019 3:02 PM

## 2019-03-17 NOTE — Plan of Care (Signed)
  Problem: Health Behavior/Discharge Planning: Goal: Ability to manage health-related needs will improve Outcome: Progressing   Problem: Clinical Measurements: Goal: Will remain free from infection Outcome: Progressing Goal: Diagnostic test results will improve Outcome: Progressing   Problem: Activity: Goal: Risk for activity intolerance will decrease Outcome: Progressing   Problem: Pain Managment: Goal: General experience of comfort will improve Outcome: Progressing   

## 2019-03-17 NOTE — Progress Notes (Signed)
Physical Therapy Treatment Patient Details Name: Robin Snow MRN: [id number] DOB: January 31, 1941 Today's Date: 03/17/2019    History of Present Illness 78  y/o female with history of atrial fibrillation, HTN, DM,CKD III had a fall at home when patient was trying to get out of the bed. Hx of R knee pain and swelling for the last few months. Found with sepsis secondary to E. coli bacteremia. Hospitalized from 10/29-11/4 with sepsis related to E. coli bacteremia, UTI, and bacterial arthritis right knee. Found to have dislocated right knee prothestic--02/22/19 s/p Closed reduction of right knee prosthetic dislocation. 11/25 underwent R AKA intubated with respiratory failure extubation 03/09/19    PT Comments    Pt sleeping in bed upon arrival and easy to arouse. Pt agreeable to PT. Pt worked on LE and UE therex for improved strengthening for carryover to transfers and functional mobility. Pt required min A for L LE SLR and hip abd secondary to weakness. Pt tires very easily during therex and educated on importance of mobility while in the hospital to prevent further deconditioning. VSS t/o session with SpO2 dropping to 88% on RA during LE therex but quickly rising above 90% with rest. Pt tolerating bed in chair position ~ 5-8 at end of session while PT assisting pt with ordering lunch tray. Pt left in chair position with all needs in reach.  Pt is progressing slowly towards PT goals. Pt presents with decreased strength, ROM, power, balance and activity tolerance limiting functional mobility. Pt would benefit from cont acute PT to improve deficits and CIR following hospital discharge in order to maximize functional mobility and decrease fall risk and caregiver burden.   Follow Up Recommendations  CIR     Equipment Recommendations  Other (comment)(TBD next venue)    Recommendations for Other Services       Precautions / Restrictions Precautions Precautions: Fall    Mobility  Bed  Mobility Overal bed mobility: Needs Assistance             General bed mobility comments: cuing to pull trunk forward in sitting with bed in chair position to assist in repositioning, mod-max A for repositioning/scooting in bed  Transfers                    Ambulation/Gait                 Stairs             Wheelchair Mobility    Modified Rankin (Stroke Patients Only)       Balance                                            Cognition Arousal/Alertness: Awake/alert Behavior During Therapy: WFL for tasks assessed/performed Overall Cognitive Status: No family/caregiver present to determine baseline cognitive functioning                                        Exercises Total Joint Exercises Ankle Circles/Pumps: AROM;Left;10 reps Quad Sets: AROM;Left;10 reps Gluteal Sets: AROM;Both;10 reps Short Arc QuadBarbaraann Boys;Left;10 reps Heel Slides: AROM;Left;10 reps Hip ABduction/ADduction: AAROM;Left;10 reps;AROM;Right Straight Leg Raises: AAROM;Left;10 reps;AROM;Right General Exercises - Upper Extremity Shoulder Flexion: AROM;Both;10 reps Other Exercises Other Exercises: chest press x 10 reps, supine    General  Comments        Pertinent Vitals/Pain Faces Pain Scale: No hurt    Home Living                      Prior Function            PT Goals (current goals can now be found in the care plan section) Progress towards PT goals: Progressing toward goals    Frequency    Min 3X/week      PT Plan Current plan remains appropriate    Co-evaluation              AM-PAC PT "6 Clicks" Mobility   Outcome Measure  Help needed turning from your back to your side while in a flat bed without using bedrails?: A Lot Help needed moving from lying on your back to sitting on the side of a flat bed without using bedrails?: Total Help needed moving to and from a bed to a chair (including a wheelchair)?:  Total Help needed standing up from a chair using your arms (e.g., wheelchair or bedside chair)?: Total Help needed to walk in hospital room?: Total Help needed climbing 3-5 steps with a railing? : Total 6 Click Score: 7    End of Session   Activity Tolerance: Patient limited by fatigue Patient left: in bed;with call bell/phone within reach;with bed alarm set(bed in chair position) Nurse Communication: Mobility status PT Visit Diagnosis: Difficulty in walking, not elsewhere classified (R26.2);History of falling (Z91.81);Muscle weakness (generalized) (M62.81)     Time: 1610-9604 PT Time Calculation (min) (ACUTE ONLY): 32 min  Charges:  $Therapeutic Exercise: 8-22 mins $Therapeutic Activity: 8-22 mins                     Karoline Caldwell PT, DPT 2:42 PM,03/17/19    [name] [name] 03/17/2019, 2:37 PM

## 2019-03-17 NOTE — Progress Notes (Signed)
PROGRESS NOTE    Robin Snow  IPJ:[id number] DOB: 1941/02/05 DOA: 02/22/2019 PCP: [name], MD   Brief Narrative:  HPI on 02/22/2019 by Dr. Fuller Plan Robin Snow is a 78 y.o. female with medical history significant of paroxysmal atrial fibrillation on anticoagulation Coumadin, hypertension, diabetes mellitus type II, and chronic kidney disease stage III.  She presents with complaints right knee pain and swelling. Hospitalized from 10/29-11/4 with sepsis related to E. coli bacteremia, UTI, and bacterial arthritis right knee.  Treated with Rocephin, then switched to Ancef, and discharged home to on Keflex to complete a 10-day course.  Patient had initially done okay when she got home and had completed antibiotics as prescribed.  3 days ago while the daughter was helping her transition she had heard a loud pop in the right knee.  Since that time she has had increasing pain and swelling of the right knee to the point in which she started developing fluid-filled blisters on her skin.  Associated symptoms include increased warmth, but the patient has not had any fever.  She had gone from being unable to bear weight on the right knee and now cannot have anyone touch it without crying out in pain.  Denies any significant nausea, vomiting, cough, chest pain, shortness of breath, diarrhea, blood in stools, or dysuria symptoms.  Interim history Patient presented with prosthetic dislocation abscess, status post closed reduction and drainage on 02/22/2019.  Currently being treated for E. coli bacteremia.  Patient underwent right AKA on 03/01/2019 and was intubated after the procedure for respiratory failure.  While in the ICU patient did have positive bacteremia and TEE TEE was concerning for endocarditis.  ID consulted, patient will need antibiotics through 04/12/2019.  Patient also noted to have postop bleeding overnight and received 1 unit PRBC.  Currently pending inpatient  rehab.  Assessment & Plan   Sepsis secondary to endocarditis, E. coli bacteremia in the context of infected knee hardware -sepsis present on admission -Orthopedic surgery consulted and appreciated, status post right AKA -TEE completed on 03/08/2019 with evidence of mobile mass and thickening of the aortic valve suspicious for aortic valvular vegetation  -Infectious disease consulted and appreciated recommended 6 weeks of Rocephin with a tentative stop date of 04/12/2019, PICC line placed -ID did not advise removal of pacemaker at this time -Pending orthopedic recommendations regarding possible hip disarticulation in setting of muscle necrosis-discussed with orthopedics, [name], would continue wound VAC for an additional week and have her follow-up as an outpatient.  No further surgery needed at this time. -PT, OT recommending CIR.  Patient appears to be a good candidate for inpatient rehab given her willingness to work with therapy.  Acute onset of acute blood loss anemia -Patient did have a hemoglobin drop of 5.9 and did receive 2 units of PRBC on 03/14/2019 -hemoglobin currently 8.7 -Heparin was restarted, with close monitoring of H&H -Continue iron supplementation  Postoperative respiratory failure requiring mechanical ventilation -Resolved -In the setting of E. coli bacteremia and recent AKA with significant tach and -Patient was extubated on 03/09/2019 -Continue incentive spirometry -Currently on room air and maintaining oxygen saturations into the 90s  Atrial fibrillation/sick sinus syndrome -Status post pacemaker -Patient did require amiodarone drip, however now is rate controlled -Home anticoagulation of Coumadin held given her postop anemia -Continue amiodarone, diltiazem, metoprolol -Heparin restarted on 03/16/2019, with close monitoring of H&H -Once hemoglobin has been established to be stable, will restart Coumadin  Protein calorie malnutrition -Continue nutrition  supplements  Electrolyte abnormalities -Resolving, monitor labs and replace as needed  DVT Prophylaxis  SCDs--> heparin  Code Status: Full  Family Communication: None at bedside  Disposition Plan: Admitted. Pending auth for CIR  Consultants Orthopedic surgery Infectious disease PCCM  Procedures  Closed reduction right knee process attic, drainage Right AKA Intubation/extubation  Antibiotics   Anti-infectives (From admission, onward)   Start     Dose/Rate Route Frequency Ordered Stop   03/02/19 0600  ceFAZolin (ANCEF) IVPB 2g/100 mL premix     2 g 200 mL/hr over 30 Minutes Intravenous On call to O.R. 03/01/19 1304 03/01/19 1526   02/23/19 0345  cefTRIAXone (ROCEPHIN) 2 g in sodium chloride 0.9 % 100 mL IVPB     2 g 200 mL/hr over 30 Minutes Intravenous Daily at bedtime 02/23/19 0336 04/12/19 2359      Subjective:   Robin Snow seen and examined today.  Patient with no complaints this morning.  Feeling tired.  Denies current chest pain, shortness breath, abdominal pain, nausea vomiting, diarrhea constipation, dizziness or headache.  Objective:   Vitals:   03/16/19 1605 03/16/19 1941 03/17/19 0308 03/17/19 0754  BP: 109/65 124/74 127/73 (!) 156/94  Pulse: 90 85 99 (!) 101  Resp: 17   17  Temp: 99 F (37.2 C) 99.2 F (37.3 C) 98.9 F (37.2 C) 99.7 F (37.6 C)  TempSrc: Oral Oral Oral Oral  SpO2: 98% 96% 93% 93%  Weight:      Height:        Intake/Output Summary (Last 24 hours) at 03/17/2019 1123 Last data filed at 03/17/2019 0900 Gross per 24 hour  Intake 1369.24 ml  Output 650 ml  Net 719.24 ml   Filed Weights   03/13/19 0500 03/14/19 0445 03/16/19 0600  Weight: 103 kg 99.9 kg 103 kg   Exam  General: Well developed, chronically ill-appearing, NAD  HEENT: NCAT, mucous membranes moist.   Cardiovascular: S1 S2 auscultated, irregular  Respiratory: Clear to auscultation bilaterally with equal chest rise  Abdomen: Soft, nontender, nondistended,  + bowel sounds  Extremities: warm dry without cyanosis clubbing or edema  Neuro: AAOx3, nonfocal  Psych: Pleasant, appropriate mood and affect  Data Reviewed: I have personally reviewed following labs and imaging studies  CBC: Recent Labs  Lab 03/11/19 0358 03/11/19 0659 03/12/19 0525 03/13/19 0641 03/14/19 0444 03/14/19 1438 03/15/19 0425 03/16/19 0517 03/17/19 0024  WBC 15.4* 16.3* 13.3* 9.3 6.1  --  5.8 4.8 4.7  NEUTROABS 10.9* 11.4* 8.7* 5.4  --   --   --   --   --   HGB 6.0* 5.8* 7.1* 7.0* 5.9* 9.1* 8.5* 8.8* 8.7*  HCT 19.4* 18.9* 22.1* 22.5* 19.1* 27.2* 27.1* 28.1* 27.4*  MCV 97.5 99.0 94.0 97.0 99.0  --  95.8 95.6 93.8  PLT 199 198 148* 175 145*  --  181 197 176   Basic Metabolic Panel: Recent Labs  Lab 03/11/19 0358 03/12/19 0525 03/13/19 0641 03/14/19 0444 03/17/19 0024  NA 143 137 139 137 136  K 4.0 4.4 4.9 4.3 4.3  CL 108 102 104 101 101  CO2 29 27 26 27 27   GLUCOSE 121* 141* 97 90 120*  BUN 17 21 16 13 14   CREATININE 0.85 0.90 0.93 0.80 0.93  CALCIUM 7.8* 7.7* 7.9* 7.9* 7.7*  MG  --   --   --  1.8  --    GFR: Estimated Creatinine Clearance: 60.6 mL/min (by C-G formula based on SCr of 0.93 mg/dL). Liver  Function Tests: Recent Labs  Lab 03/13/19 0641  AST 30  ALT 19  ALKPHOS 70  BILITOT 0.6  PROT 4.8*  ALBUMIN 1.8*   No results for input(s): LIPASE, AMYLASE in the last 168 hours. No results for input(s): AMMONIA in the last 168 hours. Coagulation Profile: No results for input(s): INR, PROTIME in the last 168 hours. Cardiac Enzymes: No results for input(s): CKTOTAL, CKMB, CKMBINDEX, TROPONINI in the last 168 hours. BNP (last 3 results) No results for input(s): PROBNP in the last 8760 hours. HbA1C: No results for input(s): HGBA1C in the last 72 hours. CBG: Recent Labs  Lab 03/16/19 0521 03/16/19 1227 03/16/19 1721 03/16/19 2048 03/17/19 0627  GLUCAP 92 98 110* 99 103*   Lipid Profile: No results for input(s): CHOL, HDL, LDLCALC,  TRIG, CHOLHDL, LDLDIRECT in the last 72 hours. Thyroid Function Tests: No results for input(s): TSH, T4TOTAL, FREET4, T3FREE, THYROIDAB in the last 72 hours. Anemia Panel: No results for input(s): VITAMINB12, FOLATE, FERRITIN, TIBC, IRON, RETICCTPCT in the last 72 hours. Urine analysis:    Component Value Date/Time   COLORURINE YELLOW 02/02/2019 1923   APPEARANCEUR CLEAR 02/02/2019 1923   LABSPEC 1.013 02/02/2019 1923   PHURINE 5.0 02/02/2019 1923   GLUCOSEU NEGATIVE 02/02/2019 1923   GLUCOSEU NEGATIVE 09/06/2017 1200   HGBUR SMALL (A) 02/02/2019 1923   BILIRUBINUR NEGATIVE 02/02/2019 1923   BILIRUBINUR Neg 10/18/2017 1453   KETONESUR NEGATIVE 02/02/2019 1923   PROTEINUR NEGATIVE 02/02/2019 1923   UROBILINOGEN 0.2 10/18/2017 1453   UROBILINOGEN 1.0 09/06/2017 1200   NITRITE POSITIVE (A) 02/02/2019 1923   LEUKOCYTESUR LARGE (A) 02/02/2019 1923   Sepsis Labs: @LABRCNTIP (procalcitonin:4,lacticidven:4)  )No results found for this or any previous visit (from the past 240 hour(s)).    Radiology Studies: No results found.   Scheduled Meds: . amiodarone  200 mg Oral Daily  . chlorhexidine  15 mL Mouth Rinse BID  . chlorhexidine gluconate (MEDLINE KIT)  15 mL Mouth Rinse BID  . Chlorhexidine Gluconate Cloth  6 each Topical Daily  . cholecalciferol  2,000 Units Oral BID  . diltiazem  120 mg Oral Daily  . feeding supplement (ENSURE ENLIVE)  237 mL Oral TID BM  . ferrous sulfate  300 mg Oral BID WC  . gabapentin  100 mg Oral TID  . Gerhardt's butt cream   Topical TID  . mouth rinse  15 mL Mouth Rinse q12n4p  . metoprolol tartrate  50 mg Oral BID  . multivitamin with minerals  1 tablet Oral Daily  . pantoprazole  40 mg Oral Daily  . pravastatin  20 mg Oral q1800  . senna-docusate  2 tablet Oral BID  . sodium chloride flush  10-40 mL Intracatheter Q12H  . sodium chloride flush  3 mL Intravenous Q12H  . Vitamin D (Ergocalciferol)  50,000 Units Oral Q7 days   Continuous  Infusions: . sodium chloride 20 mL/hr at 03/14/19 2315  . sodium chloride Stopped (03/14/19 1958)  . cefTRIAXone (ROCEPHIN)  IV 2 g (03/16/19 2344)  . heparin 1,300 Units/hr (03/17/19 1011)     LOS: 22 days   Time Spent in minutes   30 minutes  Shada Nienaber D.O. on 03/17/2019 at 11:23 AM  Between 7am to 7pm - Please see pager noted on [email or web address]  After 7pm go to [email or web address]  And look for the night coverage person covering for me after hours  [facility] Office  [phone]

## 2019-03-17 NOTE — Progress Notes (Signed)
ANTICOAGULATION CONSULT NOTE - Follow Up Consult  Pharmacy Consult for heparin Indication: atrial fibrillation  Labs: Recent Labs    03/14/19 0444 03/15/19 0425 03/16/19 0517 03/17/19 0024  HGB 5.9* 8.5* 8.8* 8.7*  HCT 19.1* 27.1* 28.1* 27.4*  PLT 145* 181 197 176  HEPARINUNFRC  --   --   --  0.21*  CREATININE 0.80  --   --   --     Assessment: 78yo female subtherapeutic on heparin after resumed for Afib; no gtt issues or signs of bleeding per RN.  Goal of Therapy:  Heparin level 0.3-0.7 units/ml   Plan:  Will increase heparin gtt by 2 units/kg/hr to 1300 units/hr and check level in 8 hours.    Wynona Neat, PharmD, BCPS  03/17/2019,1:18 AM

## 2019-03-17 NOTE — Plan of Care (Signed)
  Problem: Activity: Goal: Risk for activity intolerance will decrease Outcome: Progressing   Problem: Pain Managment: Goal: General experience of comfort will improve Outcome: Progressing   

## 2019-03-17 NOTE — Progress Notes (Signed)
ANTICOAGULATION CONSULT NOTE - Follow Up Consult  Pharmacy Consult for Heparin Indication: atrial fibrillation  Allergies  Allergen Reactions  . Aspirin Hives and Swelling    Angioedema   . Rofecoxib Hives  . Hydrocodone Hives    Tolerates oxycodone and tramadol    Patient Measurements: Height: 5\' 7"  (170.2 cm) Weight: 227 lb 1.2 oz (103 kg) IBW/kg (Calculated) : 61.6 Heparin dosing weight = 83 kg  Vital Signs: Temp: 99.7 F (37.6 C) (12/11 0754) Temp Source: Oral (12/11 0754) BP: 156/94 (12/11 0754) Pulse Rate: 101 (12/11 0754)  Labs: Recent Labs    03/15/19 0425 03/16/19 0517 03/17/19 0024  HGB 8.5* 8.8* 8.7*  HCT 27.1* 28.1* 27.4*  PLT 181 197 176  HEPARINUNFRC  --   --  0.21*  CREATININE  --   --  0.93    Estimated Creatinine Clearance: 60.6 mL/min (by C-G formula based on SCr of 0.93 mg/dL).  Assessment: 78 year old female to restart heparin after being held for drop in HgB On warfarin prior to admission for Afib  Heparin level this PM 0.21  Goal of Therapy:  Heparin level 0.3-0.7 units/ml Monitor platelets by anticoagulation protocol: Yes   Plan:  Increase heparin to 1500 units / hr Daily heparin level, CBC  Thank you Anette Guarneri, PharmD  03/17/2019,11:52 AM

## 2019-03-18 LAB — CBC
HCT: 28.4 % — ABNORMAL LOW (ref 36.0–46.0)
Hemoglobin: 9 g/dL — ABNORMAL LOW (ref 12.0–15.0)
MCH: 30.3 pg (ref 26.0–34.0)
MCHC: 31.7 g/dL (ref 30.0–36.0)
MCV: 95.6 fL (ref 80.0–100.0)
Platelets: 229 10*3/uL (ref 150–400)
RBC: 2.97 MIL/uL — ABNORMAL LOW (ref 3.87–5.11)
RDW: 18.6 % — ABNORMAL HIGH (ref 11.5–15.5)
WBC: 5.6 10*3/uL (ref 4.0–10.5)
nRBC: 0 % (ref 0.0–0.2)

## 2019-03-18 LAB — GLUCOSE, CAPILLARY
Glucose-Capillary: 94 mg/dL (ref 70–99)
Glucose-Capillary: 98 mg/dL (ref 70–99)
Glucose-Capillary: 100 mg/dL — ABNORMAL HIGH (ref 70–99)
Glucose-Capillary: 121 mg/dL — ABNORMAL HIGH (ref 70–99)
Glucose-Capillary: 152 mg/dL — ABNORMAL HIGH (ref 70–99)
Glucose-Capillary: 100 mg/dL — ABNORMAL HIGH (ref 70–99)

## 2019-03-18 LAB — HEPARIN LEVEL (UNFRACTIONATED)
Heparin Unfractionated: 0.96 IU/mL — ABNORMAL HIGH (ref 0.30–0.70)
Heparin Unfractionated: 0.65 IU/mL (ref 0.30–0.70)

## 2019-03-18 MED ORDER — WARFARIN - PHARMACIST DOSING INPATIENT: Freq: Every day | Status: DC

## 2019-03-18 MED ORDER — WARFARIN SODIUM 2.5 MG PO TABS
2.5000 mg | ORAL_TABLET | Freq: Once | ORAL | Status: AC
  Administered 2019-03-18: 2.5 mg via ORAL
  Filled 2019-03-18: qty 1

## 2019-03-18 NOTE — Progress Notes (Signed)
PROGRESS NOTE    Robin Snow  ZOX:[id number] DOB: Dec 23, 1940 DOA: 02/22/2019 PCP: [name], MD   Brief Narrative:  HPI on 02/22/2019 by Dr. Madelyn Flavors Robin Snow is a 78 y.o. female with medical history significant of paroxysmal atrial fibrillation on anticoagulation Coumadin, hypertension, diabetes mellitus type II, and chronic kidney disease stage III.  She presents with complaints right knee pain and swelling. Hospitalized from 10/29-11/4 with sepsis related to E. coli bacteremia, UTI, and bacterial arthritis right knee.  Treated with Rocephin, then switched to Ancef, and discharged home to on Keflex to complete a 10-day course.  Patient had initially done okay when she got home and had completed antibiotics as prescribed.  3 days ago while the daughter was helping her transition she had heard a loud pop in the right knee.  Since that time she has had increasing pain and swelling of the right knee to the point in which she started developing fluid-filled blisters on her skin.  Associated symptoms include increased warmth, but the patient has not had any fever.  She had gone from being unable to bear weight on the right knee and now cannot have anyone touch it without crying out in pain.  Denies any significant nausea, vomiting, cough, chest pain, shortness of breath, diarrhea, blood in stools, or dysuria symptoms.  Interim history Patient presented with prosthetic dislocation abscess, status post closed reduction and drainage on 02/22/2019.  Currently being treated for E. coli bacteremia.  Patient underwent right AKA on 03/01/2019 and was intubated after the procedure for respiratory failure.  While in the ICU patient did have positive bacteremia and TEE TEE was concerning for endocarditis.  ID consulted, patient will need antibiotics through 04/12/2019.  Patient also noted to have postop bleeding overnight and received 1 unit PRBC.  Currently pending SNF  placement.  Assessment & Plan   Sepsis secondary to endocarditis, E. coli bacteremia in the context of infected knee hardware -sepsis present on admission -Orthopedic surgery consulted and appreciated, status post right AKA -TEE completed on 03/08/2019 with evidence of mobile mass and thickening of the aortic valve suspicious for aortic valvular vegetation  -Infectious disease consulted and appreciated recommended 6 weeks of Rocephin with a tentative stop date of 04/12/2019, PICC line placed -ID did not advise removal of pacemaker at this time -Pending orthopedic recommendations regarding possible hip disarticulation in setting of muscle necrosis-discussed with orthopedics, Dr. Lajoyce Corners, would continue wound VAC for an additional week and have her follow-up as an outpatient.  No further surgery needed at this time. -PT, OT recommending CIR.  Patient appears to be a good candidate for inpatient rehab given her willingness to work with therapy. -Had peer to peer with insurance company however patient was denied CIR placement.  SNF was recommended.  Acute onset of acute blood loss anemia -Patient did have a hemoglobin drop of 5.9 and did receive 2 units of PRBC on 03/14/2019 -hemoglobin currently 9 -Heparin was restarted, with close monitoring of H&H -Continue iron supplementation  Postoperative respiratory failure requiring mechanical ventilation -Resolved -In the setting of E. coli bacteremia and recent AKA with significant tach and -Patient was extubated on 03/09/2019 -Continue incentive spirometry -Currently on room air and maintaining oxygen saturations into the 90s  Atrial fibrillation/sick sinus syndrome -Status post pacemaker -Patient did require amiodarone drip, however now is rate controlled -Home anticoagulation of Coumadin held given her postop anemia -Continue amiodarone, diltiazem, metoprolol -Heparin restarted on 03/16/2019, with close monitoring of H&H -Hemoglobin  does appear to  be stable, will restart Coumadin this evening  Protein calorie malnutrition -Continue nutrition supplements  Electrolyte abnormalities -Resolving, monitor labs and replace as needed  DVT Prophylaxis  SCDs--> heparin  Code Status: Full  Family Communication: None at bedside  Disposition Plan: Admitted.  Pending therapeutic INR, Coumadin will be started this evening.  Dispo SNF.  Consultants Orthopedic surgery Infectious disease PCCM  Procedures  Closed reduction right knee process attic, drainage Right AKA Intubation/extubation  Antibiotics   Anti-infectives (From admission, onward)   Start     Dose/Rate Route Frequency Ordered Stop   03/02/19 0600  ceFAZolin (ANCEF) IVPB 2g/100 mL premix     2 g 200 mL/hr over 30 Minutes Intravenous On call to O.R. 03/01/19 1304 03/01/19 1526   02/23/19 0345  cefTRIAXone (ROCEPHIN) 2 g in sodium chloride 0.9 % 100 mL IVPB     2 g 200 mL/hr over 30 Minutes Intravenous Daily at bedtime 02/23/19 0336 04/12/19 2359      Subjective:   Robin Snow seen and examined today.  No complaints this morning.  Denies current chest pain, shortness of breath, abdominal pain, nausea or vomiting, diarrhea constipation, dizziness or headache. Objective:   Vitals:   03/17/19 2227 03/18/19 0331 03/18/19 0818 03/18/19 0820  BP:  139/78 140/74 140/74  Pulse: 93 84 85 85  Resp:  16 18 18   Temp:  99.4 F (37.4 C) 99.6 F (37.6 C) 99.5 F (37.5 C)  TempSrc:  Oral Oral Oral  SpO2:  95% 99% 99%  Weight:      Height:        Intake/Output Summary (Last 24 hours) at 03/18/2019 1110 Last data filed at 03/18/2019 0818 Gross per 24 hour  Intake 1345.64 ml  Output 1050 ml  Net 295.64 ml   Filed Weights   03/13/19 0500 03/14/19 0445 03/16/19 0600  Weight: 103 kg 99.9 kg 103 kg   Exam  General: Well developed, chronically ill-appearing, NAD  HEENT: NCAT, mucous membranes moist.   Cardiovascular: S1 S2 auscultated, irregular  Respiratory:  Clear to auscultation bilaterally  Abdomen: Soft, nontender, nondistended, + bowel sounds  Extremities: warm dry without cyanosis clubbing or edema. R AKA with dressing in place  Neuro: AAOx3, nonfocal  Psych: Pleasant, appropriate mood and affect   Data Reviewed: I have personally reviewed following labs and imaging studies  CBC: Recent Labs  Lab 03/12/19 0525 03/13/19 0641 03/14/19 0444 03/14/19 1438 03/15/19 0425 03/16/19 0517 03/17/19 0024 03/18/19 0858  WBC 13.3* 9.3 6.1  --  5.8 4.8 4.7 5.6  NEUTROABS 8.7* 5.4  --   --   --   --   --   --   HGB 7.1* 7.0* 5.9* 9.1* 8.5* 8.8* 8.7* 9.0*  HCT 22.1* 22.5* 19.1* 27.2* 27.1* 28.1* 27.4* 28.4*  MCV 94.0 97.0 99.0  --  95.8 95.6 93.8 95.6  PLT 148* 175 145*  --  181 197 176 229   Basic Metabolic Panel: Recent Labs  Lab 03/12/19 0525 03/13/19 0641 03/14/19 0444 03/17/19 0024  NA 137 139 137 136  K 4.4 4.9 4.3 4.3  CL 102 104 101 101  CO2 27 26 27 27   GLUCOSE 141* 97 90 120*  BUN 21 16 13 14   CREATININE 0.90 0.93 0.80 0.93  CALCIUM 7.7* 7.9* 7.9* 7.7*  MG  --   --  1.8  --    GFR: Estimated Creatinine Clearance: 60.6 mL/min (by C-G formula based on SCr of 0.93 mg/dL).  Liver Function Tests: Recent Labs  Lab 03/13/19 0641  AST 30  ALT 19  ALKPHOS 70  BILITOT 0.6  PROT 4.8*  ALBUMIN 1.8*   No results for input(s): LIPASE, AMYLASE in the last 168 hours. No results for input(s): AMMONIA in the last 168 hours. Coagulation Profile: No results for input(s): INR, PROTIME in the last 168 hours. Cardiac Enzymes: No results for input(s): CKTOTAL, CKMB, CKMBINDEX, TROPONINI in the last 168 hours. BNP (last 3 results) No results for input(s): PROBNP in the last 8760 hours. HbA1C: No results for input(s): HGBA1C in the last 72 hours. CBG: Recent Labs  Lab 03/17/19 1628 03/17/19 2128 03/18/19 0649 03/18/19 0651 03/18/19 0821  GLUCAP 130* 118* 94 98 100*   Lipid Profile: No results for input(s): CHOL, HDL,  LDLCALC, TRIG, CHOLHDL, LDLDIRECT in the last 72 hours. Thyroid Function Tests: No results for input(s): TSH, T4TOTAL, FREET4, T3FREE, THYROIDAB in the last 72 hours. Anemia Panel: No results for input(s): VITAMINB12, FOLATE, FERRITIN, TIBC, IRON, RETICCTPCT in the last 72 hours. Urine analysis:    Component Value Date/Time   COLORURINE YELLOW 02/02/2019 1923   APPEARANCEUR CLEAR 02/02/2019 1923   LABSPEC 1.013 02/02/2019 1923   PHURINE 5.0 02/02/2019 1923   GLUCOSEU NEGATIVE 02/02/2019 1923   GLUCOSEU NEGATIVE 09/06/2017 1200   HGBUR SMALL (A) 02/02/2019 1923   BILIRUBINUR NEGATIVE 02/02/2019 1923   BILIRUBINUR Neg 10/18/2017 1453   KETONESUR NEGATIVE 02/02/2019 1923   PROTEINUR NEGATIVE 02/02/2019 1923   UROBILINOGEN 0.2 10/18/2017 1453   UROBILINOGEN 1.0 09/06/2017 1200   NITRITE POSITIVE (A) 02/02/2019 1923   LEUKOCYTESUR LARGE (A) 02/02/2019 1923   Sepsis Labs: @LABRCNTIP (procalcitonin:4,lacticidven:4)  )No results found for this or any previous visit (from the past 240 hour(s)).    Radiology Studies: No results found.   Scheduled Meds: . amiodarone  200 mg Oral Daily  . chlorhexidine  15 mL Mouth Rinse BID  . chlorhexidine gluconate (MEDLINE KIT)  15 mL Mouth Rinse BID  . Chlorhexidine Gluconate Cloth  6 each Topical Daily  . cholecalciferol  2,000 Units Oral BID  . diltiazem  120 mg Oral Daily  . feeding supplement (ENSURE ENLIVE)  237 mL Oral TID BM  . ferrous sulfate  300 mg Oral BID WC  . gabapentin  100 mg Oral TID  . Gerhardt's butt cream   Topical TID  . mouth rinse  15 mL Mouth Rinse q12n4p  . metoprolol tartrate  50 mg Oral BID  . multivitamin with minerals  1 tablet Oral Daily  . pantoprazole  40 mg Oral Daily  . pravastatin  20 mg Oral q1800  . senna-docusate  2 tablet Oral BID  . sodium chloride flush  10-40 mL Intracatheter Q12H  . sodium chloride flush  3 mL Intravenous Q12H  . Vitamin D (Ergocalciferol)  50,000 Units Oral Q7 days    Continuous Infusions: . sodium chloride 20 mL/hr at 03/14/19 2315  . sodium chloride Stopped (03/14/19 1958)  . cefTRIAXone (ROCEPHIN)  IV 2 g (03/17/19 2232)  . heparin 1,300 Units/hr (03/18/19 1036)     LOS: 23 days   Time Spent in minutes   30 minutes  Tameka Hoiland D.O. on 03/18/2019 at 11:10 AM  Between 7am to 7pm - Please see pager noted on [email or web address]  After 7pm go to [email or web address]  And look for the night coverage person covering for me after hours  [facility] Office  [phone]

## 2019-03-18 NOTE — Progress Notes (Signed)
Occupational Therapy Treatment Patient Details Name: Robin Snow MRN: [id number] DOB: [birth year] Today's Date: 03/18/2019    History of present illness 78  y/o female with history of atrial fibrillation, HTN, DM,CKD III had a fall at home when patient was trying to get out of the bed. Hx of R knee pain and swelling for the last few months. Found with sepsis secondary to E. coli bacteremia. Hospitalized from 10/29-11/4 with sepsis related to E. coli bacteremia, UTI, and bacterial arthritis right knee. Found to have dislocated right knee prothestic--02/22/19 s/p Closed reduction of right knee prosthetic dislocation. 11/25 underwent R AKA intubated with respiratory failure extubation 03/09/19   OT comments  Pt making slow but steady progress towards OT goals this session. Session focus on sitting balance EOB as precursor to higher level ADLs. Overall, pt required MAX A +2 for all bed mobility. Pt able to sit EOB ~ 6 minutes to eat breakfast and take meds. Pt required intermittent MINA for balance as pt presents with posterior lean. Noted pt denied CIR, d/t pt CLOF pt would require SNF level therapy at time of DC. If pt declines SNF placement, pt would need MAX amount of [place] services and DME listed below. Will let OTR know about change in POC. Will continue to follow acutely per POC and update DC recs as needed.    Follow Up Recommendations  SNF;Supervision/Assistance - 24 hour    Equipment Recommendations  Wheelchair (measurements OT);Wheelchair cushion (measurements OT);Hospital bed;Tub/shower bench;3 in 1 bedside commode    Recommendations for Other Services      Precautions / Restrictions Precautions Precautions: Fall Restrictions Weight Bearing Restrictions: Yes RLE Weight Bearing: Non weight bearing Other Position/Activity Restrictions: R AKA       Mobility Bed Mobility Overal bed mobility: Needs Assistance Bed Mobility: Supine to Sit;Sit to Supine     Supine to sit: Max  assist;+2 for physical assistance;HOB elevated Sit to supine: Total assist;+2 for physical assistance;HOB elevated   General bed mobility comments: pt initially moving LLE towards EOB as well as reaching with RUE towards bed rails but unable to sustain grip on rail needing max A to pivot hips to EOB and elevate trunk with HOB elevated. total A to return to supine from sitting  Transfers                 General transfer comment: unable to complete this session    Balance Overall balance assessment: Needs assistance Sitting-balance support: Bilateral upper extremity supported;No upper extremity supported;Feet supported Sitting balance-Leahy Scale: Poor Sitting balance - Comments: intermittent MIN A to eat breakfast EOB as pt noted to lean back Postural control: Posterior lean                                 ADL either performed or assessed with clinical judgement   ADL Overall ADL's : Needs assistance/impaired Eating/Feeding: Minimal assistance;Sitting Eating/Feeding Details (indicate cue type and reason): MINA to put jelly on toast as pt does not have glasses                                 Functional mobility during ADLs: Maximal assistance;+2 for physical assistance(bed mobility only) General ADL Comments: session focus on sitting balance EOB during functional task. Pt sat EOb ~ 6 mins with intermittent MIN A.     Vision Baseline Vision/History: Wears  glasses Wears Glasses: Reading only Patient Visual Report: No change from baseline Additional Comments: pt reports she wears glasses but does not currently have them with her, needed assist to open jelly and place on toast   Perception     Praxis      Cognition Arousal/Alertness: Awake/alert Behavior During Therapy: [facility] for tasks assessed/performed;Flat affect Overall Cognitive Status: No family/caregiver present to determine baseline cognitive functioning Area of Impairment:  Attention;Memory;Problem solving                   Current Attention Level: Selective Memory: Decreased short-term memory       Problem Solving: Slow processing;Decreased initiation;Requires verbal cues;Difficulty sequencing;Requires tactile cues General Comments: verbal cues to initiate, decreased task persistence. cues needed to reorient pt back to task        Exercises     Shoulder Instructions       General Comments      Pertinent Vitals/ Pain       Pain Assessment: Faces Faces Pain Scale: Hurts little more Pain Location: R AKA Pain Descriptors / Indicators: Grimacing;Guarding;Discomfort Pain Intervention(s): Limited activity within patient's tolerance;Monitored during session;Repositioned  Home Living                                          Prior Functioning/Environment              Frequency  Min 2X/week        Progress Toward Goals  OT Goals(current goals can now be found in the care plan section)  Progress towards OT goals: Progressing toward goals  Acute Rehab OT Goals Patient Stated Goal: to return home and cook OT Goal Formulation: With patient/family Time For Goal Achievement: 03/24/19 Potential to Achieve Goals: Good  Plan Discharge plan remains appropriate    Co-evaluation                 AM-PAC OT "6 Clicks" Daily Activity     Outcome Measure   Help from another person eating meals?: A Little Help from another person taking care of personal grooming?: A Little Help from another person toileting, which includes using toliet, bedpan, or urinal?: Total Help from another person bathing (including washing, rinsing, drying)?: A Lot Help from another person to put on and taking off regular upper body clothing?: A Lot Help from another person to put on and taking off regular lower body clothing?: Total 6 Click Score: 12    End of Session    OT Visit Diagnosis: Unsteadiness on feet (R26.81);Muscle weakness  (generalized) (M62.81);Pain Pain - Right/Left: Right Pain - part of body: Leg   Activity Tolerance Patient tolerated treatment well   Patient Left in bed;with call bell/phone within reach;with nursing/sitter in room   Nurse Communication          Time: 0912-0930 OT Time Calculation (min): 18 min  Charges: OT General Charges $OT Visit: 1 Visit OT Treatments $Self Care/Home Management : 8-22 mins  Audery Amel., COTA/L Acute Rehabilitation Services [phone]    Angelina Pih 03/18/2019, 1:10 PM

## 2019-03-18 NOTE — Progress Notes (Signed)
Patient's dressing soiled from incontinent BM this morning, bleeding noted with movement from insertion site. New dressing applied by nurse.  Day shift nurse notified

## 2019-03-18 NOTE — Plan of Care (Signed)
Problem: Health Behavior/Discharge Planning: Goal: Ability to manage health-related needs will improve Outcome: Progressing   Problem: Clinical Measurements: Goal: Will remain free from infection Outcome: Progressing   Problem: Activity: Goal: Risk for activity intolerance will decrease Outcome: Progressing   Problem: Pain Managment: Goal: General experience of comfort will improve Outcome: Progressing   Problem: Skin Integrity: Goal: Risk for impaired skin integrity will decrease Outcome: Progressing   

## 2019-03-18 NOTE — Progress Notes (Signed)
ANTICOAGULATION CONSULT NOTE - Follow Up Consult  Pharmacy Consult for Heparin Indication: atrial fibrillation  Allergies  Allergen Reactions  . Aspirin Hives and Swelling    Angioedema   . Rofecoxib Hives  . Hydrocodone Hives    Tolerates oxycodone and tramadol    Patient Measurements: Height: 5\' 7"  (170.2 cm) Weight: 227 lb 1.2 oz (103 kg) IBW/kg (Calculated) : 61.6 Heparin dosing weight = 83 kg  Vital Signs: Temp: 98 F (36.7 C) (12/12 1927) Temp Source: Oral (12/12 1927) BP: 123/81 (12/12 1927) Pulse Rate: 84 (12/12 1927)  Labs: Recent Labs    03/16/19 0517 03/16/19 0517 03/17/19 0024 03/17/19 1031 03/18/19 0858 03/18/19 1833  HGB 8.8*  --  8.7*  --  9.0*  --   HCT 28.1*  --  27.4*  --  28.4*  --   PLT 197  --  176  --  229  --   HEPARINUNFRC  --    < > 0.21* 0.25* 0.96* 0.65  CREATININE  --   --  0.93  --   --   --    < > = values in this interval not displayed.    Estimated Creatinine Clearance: 60.6 mL/min (by C-G formula based on SCr of 0.93 mg/dL).  Assessment: 79 YOF with history of Afib on Coumadin PTA.  INR elevated on admit and was reversed with Vitamin K and then transitioned to IV heparin.  Heparin subsequently held due to anemia and now resumed.  Heparin level is therapeutic and toward the high end of normal.  No bleeding reported.  Goal of Therapy:  Heparin level 0.3-0.7 units/ml Monitor platelets by anticoagulation protocol: Yes   Plan:  Reduce heparin infusion slightly to 1250 units/hr F/U AM labs  Dyann Goodspeed D. Mina Marble, PharmD, BCPS, [place] 03/18/2019, 8:27 PM

## 2019-03-18 NOTE — NC FL2 (Signed)
[place] LEVEL OF CARE SCREENING TOOL     IDENTIFICATION  Patient Name: Robin Snow Birthdate: 22-Oct-1940 Sex: female Admission Date (Current Location): 02/22/2019  [facility] [place] and [place] Number:  Herbalist and Address:  [facility]. [facility], [place] [address], [place], [place] [address]      Provider Number: [id number]  Attending Physician Name and Address:  Cristal Ford, DO  Relative Name and Phone Number:  Janett Billow [phone]    Current Level of Care: Hospital Recommended Level of Care: [place] Prior Approval Number:    Date Approved/Denied:   PASRR Number: [id number]  Discharge Plan: SNF    Current Diagnoses: Patient Active Problem List   Diagnosis Date Noted  . Acute respiratory failure ([place])   . Pressure injury of skin 03/01/2019  . Dislocation of right knee   . Osteomyelitis of right knee region [facility])   . Severe protein-calorie malnutrition ([place])   . Abscess of thigh   . Chronic osteomyelitis of right femur with draining sinus ([place])   . Infection of total right knee replacement ([place])   . Bacteremia due to Escherichia coli 02/23/2019  . Iron deficiency anemia due to chronic blood loss 02/23/2019  . Closed dislocation of right knee 02/22/2019  . Effusion of right knee joint 02/22/2019  . AF (paroxysmal atrial fibrillation) ([place]) 02/22/2019  . Atrial fibrillation with RVR ([place]) 02/03/2019  . Acute cystitis with hematuria   . Sepsis ([place]) 02/02/2019  . Foot pain 07/19/2018  . Warm antibody hemolytic anemia 11/24/2017  . Pre-op evaluation 09/24/2017  . Urinary frequency 09/06/2017  . Obstructive sleep apnea   . Long term (current) use of anticoagulants 12/25/2016  . Orthostatic hypotension 09/08/2016  . Supratherapeutic INR 09/08/2016  . Chronic diastolic CHF (congestive heart failure) ([place]) 09/08/2016  . Sinus pause: 4.02-4.80sec per telemetry 09/08/2016 09/08/2016  . Tachy-brady  syndrome ([place]) 09/08/2016  . Sinus bradycardia 07/08/2016  . CKD (chronic kidney disease) stage 3, GFR 30-59 ml/min 05/11/2016  . Anemia 05/01/2016  . Diabetes mellitus with complication ([place])   . Physical deconditioning 12/14/2014  . Essential hypertension 05/18/2014  . HLD (hyperlipidemia) 05/18/2014  . Chronic venous insufficiency 05/11/2014  . Embolic stroke ([place]) 86/75/4492  . Right hemiparesis ([place]) 01/10/2014  . GERD (gastroesophageal reflux disease)   . Chronic pain syndrome 09/07/2013  . Painful total knee replacement ([place]) 08/01/2013  . Permanent atrial fibrillation ([place]) 05/22/2010    Orientation RESPIRATION BLADDER Height & Weight     Self  Normal Incontinent, Indwelling catheter Weight: 227 lb 1.2 oz (103 kg) Height:  _0  (170.2 cm)  BEHAVIORAL SYMPTOMS/MOOD NEUROLOGICAL BOWEL NUTRITION STATUS      Incontinent Diet(see discharge summary)  AMBULATORY STATUS COMMUNICATION OF NEEDS Skin   Total Care Verbally Surgical wounds, Bruising                       Personal Care Assistance Level of Assistance  Total care       Total Care Assistance: Maximum assistance   Functional Limitations Info             SPECIAL CARE FACTORS FREQUENCY  PT (By licensed PT), OT (By licensed OT)     PT Frequency: 5 times a week OT Frequency: 5 times a week            Contractures      Additional Factors Info  Code Status Code Status Info: Full  Current Medications (03/18/2019):  This is the current hospital active medication list Current Facility-Administered Medications  Medication Dose Route Frequency Provider Last Rate Last Admin  . 0.9 %  sodium chloride infusion   Intravenous Continuous Darreld Mclean, PA-C 20 mL/hr at 03/14/19 2315 New Bag at 03/14/19 2315  . 0.9 %  sodium chloride infusion   Intravenous PRN Rush Farmer, MD   Stopped at 03/14/19 1958  . acetaminophen (TYLENOL) tablet 650 mg  650 mg Oral Q6H PRN Judd Lien, MD    650 mg at 03/17/19 0645   Or  . acetaminophen (TYLENOL) suppository 650 mg  650 mg Rectal Q6H PRN Judd Lien, MD      . albuterol (PROVENTIL) (2.5 MG/3ML) 0.083% nebulizer solution 2.5 mg  2.5 mg Nebulization Q6H PRN Delray Alt, PA-C      . amiodarone (PACERONE) tablet 200 mg  200 mg Oral Daily Bowser, Grace E, NP   200 mg at 03/17/19 1011  . bisacodyl (DULCOLAX) suppository 10 mg  10 mg Rectal Daily PRN Bowser, La[place] Dimmer, NP      . cefTRIAXone (ROCEPHIN) 2 g in sodium chloride 0.9 % 100 mL IVPB  2 g Intravenous QHS Golden Circle, FNP 200 mL/hr at 03/17/19 2232 2 g at 03/17/19 2232  . chlorhexidine (PERIDEX) 0.12 % solution 15 mL  15 mL Mouth Rinse BID Kipp Brood, MD   15 mL at 03/17/19 2227  . chlorhexidine gluconate (MEDLINE KIT) (PERIDEX) 0.12 % solution 15 mL  15 mL Mouth Rinse BID Bowser, Laurel Dimmer, NP   15 mL at 03/18/19 0830  . Chlorhexidine Gluconate Cloth 2 % PADS 6 each  6 each Topical Daily Irene Pap N, DO   6 each at 03/17/19 1013  . cholecalciferol (VITAMIN D3) tablet 2,000 Units  2,000 Units Oral BID Kipp Brood, MD   2,000 Units at 03/17/19 2227  . diltiazem (CARDIZEM CD) 24 hr capsule 120 mg  120 mg Oral Daily Merlene Laughter F, NP   120 mg at 03/17/19 1011  . feeding supplement (ENSURE ENLIVE) (ENSURE ENLIVE) liquid 237 mL  237 mL Oral TID BM Mikhail, Velta Addison, DO   237 mL at 03/17/19 2036  . ferrous sulfate 300 (60 Fe) MG/5ML syrup 300 mg  300 mg Oral BID WC Agarwala, Ravi, MD   300 mg at 03/18/19 0810  . gabapentin (NEURONTIN) capsule 100 mg  100 mg Oral TID Merlene Laughter F, NP   100 mg at 03/17/19 2227  . Gerhardt's butt cream   Topical TID Merlene Laughter F, NP   Given at 03/17/19 2229  . heparin ADULT infusion 100 units/mL (25000 units/268m sodium chloride 0.45%)  1,500 Units/hr Intravenous Continuous MCristal Ford DO 15 mL/hr at 03/18/19 0242 1,500 Units/hr at 03/18/19 0242  . HYDROmorphone (DILAUDID) injection 0.5 mg  0.5 mg Intravenous Q2H PRN  AKipp Brood MD   0.5 mg at 03/14/19 2135  . MEDLINE mouth rinse  15 mL Mouth Rinse q12n4p Agarwala, Ravi, MD   15 mL at 03/16/19 1743  . metoprolol tartrate (LOPRESSOR) injection 5 mg  5 mg Intravenous Q6H PRN DMerlene LaughterF, NP   5 mg at 03/11/19 0943  . metoprolol tartrate (LOPRESSOR) tablet 50 mg  50 mg Oral BID AKipp Brood MD   50 mg at 03/17/19 2227  . multivitamin with minerals tablet 1 tablet  1 tablet Oral Daily AKipp Brood MD   1 tablet at 03/17/19 1011  . ondansetron (ZOFRAN) injection  4 mg  4 mg Intravenous Q6H PRN Delray Alt, PA-C   4 mg at 03/16/19 0941  . pantoprazole (PROTONIX) EC tablet 40 mg  40 mg Oral Daily Agarwala, Einar Grad, MD   40 mg at 03/17/19 1011  . pravastatin (PRAVACHOL) tablet 20 mg  20 mg Oral q1800 Kipp Brood, MD   20 mg at 03/17/19 1723  . senna-docusate (Senokot-S) tablet 2 tablet  2 tablet Oral BID Kipp Brood, MD   2 tablet at 03/17/19 2227  . sodium chloride flush (NS) 0.9 % injection 10-40 mL  10-40 mL Intracatheter Q12H Kipp Brood, MD   10 mL at 03/16/19 0939  . sodium chloride flush (NS) 0.9 % injection 10-40 mL  10-40 mL Intracatheter PRN Agarwala, Einar Grad, MD      . sodium chloride flush (NS) 0.9 % injection 3 mL  3 mL Intravenous Q12H Patrecia Pace A, PA-C   3 mL at 03/15/19 2114  . traMADol (ULTRAM) tablet 50-100 mg  50-100 mg Oral Q6H PRN Kipp Brood, MD   50 mg at 03/18/19 0409  . Vitamin D (Ergocalciferol) (DRISDOL) capsule 50,000 Units  50,000 Units Oral Q7 days Kipp Brood, MD   50,000 Units at 03/16/19 1225     Discharge Medications: Please see discharge summary for a list of discharge medications.  Relevant Imaging Results:  Relevant Lab Results:   Additional Information ss # [phone] will need 30 days of skiled nursing  [employer], LCSW

## 2019-03-18 NOTE — Progress Notes (Addendum)
ANTICOAGULATION CONSULT NOTE - Follow Up Consult  Pharmacy Consult for Heparin and warfarin Indication: atrial fibrillation  Allergies  Allergen Reactions  . Aspirin Hives and Swelling    Angioedema   . Rofecoxib Hives  . Hydrocodone Hives    Tolerates oxycodone and tramadol    Patient Measurements: Height: 5\' 7"  (170.2 cm) Weight: 227 lb 1.2 oz (103 kg) IBW/kg (Calculated) : 61.6 Heparin dosing weight = 83 kg  Vital Signs: Temp: 99.5 F (37.5 C) (12/12 0820) Temp Source: Oral (12/12 0820) BP: 140/74 (12/12 0820) Pulse Rate: 85 (12/12 0820)  Labs: Recent Labs    03/16/19 0517 03/17/19 0024 03/17/19 1031 03/18/19 0858  HGB 8.8* 8.7*  --  9.0*  HCT 28.1* 27.4*  --  28.4*  PLT 197 176  --  229  HEPARINUNFRC  --  0.21* 0.25* 0.96*  CREATININE  --  0.93  --   --     Estimated Creatinine Clearance: 60.6 mL/min (by C-G formula based on SCr of 0.93 mg/dL).  Assessment: 78 year old female to restart heparin after being held for drop in HgB On warfarin prior to admission for Afib  Heparin level this AM was supratherapeutic at 0.96 after rate increase to 1500 units/hr. Confirmed with lab and RN that level was drawn correctly. RN reports minor bleeding around wound dressing. CBC stable, Hgb 9.0, Plt 229.  Goal of Therapy:  Heparin level 0.3-0.7 units/ml Monitor platelets by anticoagulation protocol: Yes   Plan:  Reduce heparin infusion to 1300 units/hr Heparin level in 8 hours Daily heparin level, CBC, s/sx of bleeding  Addendum: Pharmacy consulted to resume warfarin. Pt was previously discharged 11/04 on warfarin 5mg  daily except 7.5mg  qWed but reported taking higher dose. At this admission, pt had elevated INR at 7.3 while taking 10 mg on Wed, Sat, Sun and 7.5 mg all other days. Pt is also on amiodarone chronically.   Plan: Warfarin 2.5 mg once tonight Monitor daily INR  Berenice Bouton, PharmD PGY1 Pharmacy Resident Phone until 3:30 pm:  [phone] 03/18/2019,10:30 AM

## 2019-03-19 LAB — GLUCOSE, CAPILLARY
Glucose-Capillary: 103 mg/dL — ABNORMAL HIGH (ref 70–99)
Glucose-Capillary: 120 mg/dL — ABNORMAL HIGH (ref 70–99)
Glucose-Capillary: 128 mg/dL — ABNORMAL HIGH (ref 70–99)
Glucose-Capillary: 140 mg/dL — ABNORMAL HIGH (ref 70–99)
Glucose-Capillary: 148 mg/dL — ABNORMAL HIGH (ref 70–99)
Glucose-Capillary: 153 mg/dL — ABNORMAL HIGH (ref 70–99)
Glucose-Capillary: 99 mg/dL (ref 70–99)

## 2019-03-19 LAB — HEPARIN LEVEL (UNFRACTIONATED): Heparin Unfractionated: 0.55 IU/mL (ref 0.30–0.70)

## 2019-03-19 LAB — CBC
HCT: 26.4 % — ABNORMAL LOW (ref 36.0–46.0)
Hemoglobin: 8.5 g/dL — ABNORMAL LOW (ref 12.0–15.0)
MCH: 30.4 pg (ref 26.0–34.0)
MCHC: 32.2 g/dL (ref 30.0–36.0)
MCV: 94.3 fL (ref 80.0–100.0)
Platelets: 166 10*3/uL (ref 150–400)
RBC: 2.8 MIL/uL — ABNORMAL LOW (ref 3.87–5.11)
RDW: 18.6 % — ABNORMAL HIGH (ref 11.5–15.5)
WBC: 5.7 10*3/uL (ref 4.0–10.5)
nRBC: 0 % (ref 0.0–0.2)

## 2019-03-19 LAB — PROTIME-INR
INR: 1.1 (ref 0.8–1.2)
Prothrombin Time: 14.2 seconds (ref 11.4–15.2)

## 2019-03-19 MED ORDER — WARFARIN SODIUM 2.5 MG PO TABS
2.5000 mg | ORAL_TABLET | Freq: Once | ORAL | Status: AC
  Administered 2019-03-19: 2.5 mg via ORAL
  Filled 2019-03-19: qty 1

## 2019-03-19 NOTE — Plan of Care (Signed)
  Problem: Health Behavior/Discharge Planning: Goal: Ability to manage health-related needs will improve Outcome: Progressing   Problem: Clinical Measurements: Goal: Will remain free from infection Outcome: Progressing Goal: Diagnostic test results will improve Outcome: Progressing   Problem: Activity: Goal: Risk for activity intolerance will decrease Outcome: Progressing   Problem: Pain Managment: Goal: General experience of comfort will improve Outcome: Progressing   Problem: Skin Integrity: Goal: Risk for impaired skin integrity will decrease Outcome: Progressing

## 2019-03-19 NOTE — Progress Notes (Addendum)
1940: MEWs score report of 4 due to HR and previous documentation of LOC as responds to voice.   ECG HR reported 134, verified with CCMD that pt stats in the 120s-130s with afib.  2000: Patient is alert but drowsy. Patient denies chest pain or shortness of breath. With new assessment patient's MEWs score=2  Paged on call [facility] of MEWS score change  2024: Administered night time Lopressor and PRN tramadol for pain    2100: Temp 98.7, BP 108/62, HR 125, Respirations 18, SpO2 99%  2230: Patient's HR stating in the 110's-120's

## 2019-03-19 NOTE — Progress Notes (Signed)
PROGRESS NOTE    Robin Snow  OZH:[id number] DOB: 28-Aug-1940 DOA: 02/22/2019 PCP: [name], MD   Brief Narrative:  HPI on 02/22/2019 by Dr. Madelyn Flavors Robin Snow is a 78 y.o. female with medical history significant of paroxysmal atrial fibrillation on anticoagulation Coumadin, hypertension, diabetes mellitus type II, and chronic kidney disease stage III.  She presents with complaints right knee pain and swelling. Hospitalized from 10/29-11/4 with sepsis related to E. coli bacteremia, UTI, and bacterial arthritis right knee.  Treated with Rocephin, then switched to Ancef, and discharged home to on Keflex to complete a 10-day course.  Patient had initially done okay when she got home and had completed antibiotics as prescribed.  3 days ago while the daughter was helping her transition she had heard a loud pop in the right knee.  Since that time she has had increasing pain and swelling of the right knee to the point in which she started developing fluid-filled blisters on her skin.  Associated symptoms include increased warmth, but the patient has not had any fever.  She had gone from being unable to bear weight on the right knee and now cannot have anyone touch it without crying out in pain.  Denies any significant nausea, vomiting, cough, chest pain, shortness of breath, diarrhea, blood in stools, or dysuria symptoms.  Interim history Patient presented with prosthetic dislocation abscess, status post closed reduction and drainage on 02/22/2019.  Currently being treated for E. coli bacteremia.  Patient underwent right AKA on 03/01/2019 and was intubated after the procedure for respiratory failure.  While in the ICU patient did have positive bacteremia and TEE TEE was concerning for endocarditis.  ID consulted, patient will need antibiotics through 04/12/2019.  Patient also noted to have postop bleeding overnight and received 1 unit PRBC.  Currently pending SNF  placement.  Assessment & Plan   Sepsis secondary to endocarditis, E. coli bacteremia in the context of infected knee hardware -sepsis present on admission -Orthopedic surgery consulted and appreciated, status post right AKA -TEE completed on 03/08/2019 with evidence of mobile mass and thickening of the aortic valve suspicious for aortic valvular vegetation  -Infectious disease consulted and appreciated recommended 6 weeks of Rocephin with a tentative stop date of 04/12/2019, PICC line placed -ID did not advise removal of pacemaker at this time -Pending orthopedic recommendations regarding possible hip disarticulation in setting of muscle necrosis-discussed with orthopedics, Dr. Lajoyce Corners, would continue wound VAC for an additional week and have her follow-up as an outpatient.  No further surgery needed at this time. -PT, OT recommending CIR.  Patient appears to be a good candidate for inpatient rehab given her willingness to work with therapy. -Had peer to peer with insurance company however patient was denied CIR placement.  SNF was recommended.  Acute onset of acute blood loss anemia -Patient did have a hemoglobin drop of 5.9 and did receive 2 units of PRBC on 03/14/2019 -hemoglobin currently 8.5 -Heparin and coumadin were restarted, with close monitoring of H&H -Continue iron supplementation  Postoperative respiratory failure requiring mechanical ventilation -Resolved -In the setting of E. coli bacteremia and recent AKA with significant tach and -Patient was extubated on 03/09/2019 -Continue incentive spirometry -Currently on room air and maintaining oxygen saturations into the 90s  Atrial fibrillation/sick sinus syndrome -Status post pacemaker -Patient did require amiodarone drip, however now is rate controlled -Home anticoagulation of Coumadin held given her postop anemia -Continue amiodarone, diltiazem, metoprolol -Heparin restarted on 03/16/2019, with close monitoring of  H&H -Hemoglobin  stable -Coumadin restarted on 03/18/2019 -INR 1.1  Protein calorie malnutrition -Continue nutrition supplements  Electrolyte abnormalities -Resolving, monitor labs and replace as needed  DVT Prophylaxis  SCDs--> heparin, coumadin   Code Status: Full  Family Communication: None at bedside  Disposition Plan: Admitted.  Pending therapeutic INR. Dispo SNF.  Consultants Orthopedic surgery Infectious disease PCCM  Procedures  Closed reduction right knee process attic, drainage Right AKA Intubation/extubation  Antibiotics   Anti-infectives (From admission, onward)   Start     Dose/Rate Route Frequency Ordered Stop   03/02/19 0600  ceFAZolin (ANCEF) IVPB 2g/100 mL premix     2 g 200 mL/hr over 30 Minutes Intravenous On call to O.R. 03/01/19 1304 03/01/19 1526   02/23/19 0345  cefTRIAXone (ROCEPHIN) 2 g in sodium chloride 0.9 % 100 mL IVPB     2 g 200 mL/hr over 30 Minutes Intravenous Daily at bedtime 02/23/19 0336 04/12/19 2359      Subjective:   Robin Snow seen and examined today.  Patient complains of pain all over, and also in her thigh.  Denies current chest pain or shortness of breath, dizziness or headache, abdominal pain, nausea or vomiting. Objective:   Vitals:   03/18/19 1558 03/18/19 1927 03/19/19 0351 03/19/19 0700  BP: 125/83 123/81 132/67 (!) 150/85  Pulse: 85 84 88 98  Resp: 18 16 16 17   Temp: 98.7 F (37.1 C) 98 F (36.7 C) 98.8 F (37.1 C) 98 F (36.7 C)  TempSrc: Oral Oral Oral Oral  SpO2: 95% 96% 98% 98%  Weight:      Height:        Intake/Output Summary (Last 24 hours) at 03/19/2019 1137 Last data filed at 03/19/2019 0300 Gross per 24 hour  Intake --  Output 400 ml  Net -400 ml   Filed Weights   03/13/19 0500 03/14/19 0445 03/16/19 0600  Weight: 103 kg 99.9 kg 103 kg   Exam  General: Well developed, chronically ill-appearing, mild distress  HEENT: NCAT, mucous membranes moist.   Cardiovascular: S1 S2 auscultated,  irregular  Respiratory: Clear to auscultation bilaterally   Abdomen: Soft, nontender, nondistended, + bowel sounds  Extremities: warm dry without cyanosis clubbing or edema.  Right AKA with dressing in place, blood noted on dressing  Neuro: AAOx3, nonfocal  Psych: Appropriate mood and affect  Data Reviewed: I have personally reviewed following labs and imaging studies  CBC: Recent Labs  Lab 03/13/19 0641 03/15/19 0425 03/16/19 0517 03/17/19 0024 03/18/19 0858 03/19/19 0424  WBC 9.3 5.8 4.8 4.7 5.6 5.7  NEUTROABS 5.4  --   --   --   --   --   HGB 7.0* 8.5* 8.8* 8.7* 9.0* 8.5*  HCT 22.5* 27.1* 28.1* 27.4* 28.4* 26.4*  MCV 97.0 95.8 95.6 93.8 95.6 94.3  PLT 175 181 197 176 229 166   Basic Metabolic Panel: Recent Labs  Lab 03/13/19 0641 03/14/19 0444 03/17/19 0024  NA 139 137 136  K 4.9 4.3 4.3  CL 104 101 101  CO2 26 27 27   GLUCOSE 97 90 120*  BUN 16 13 14   CREATININE 0.93 0.80 0.93  CALCIUM 7.9* 7.9* 7.7*  MG  --  1.8  --    GFR: Estimated Creatinine Clearance: 60.6 mL/min (by C-G formula based on SCr of 0.93 mg/dL). Liver Function Tests: Recent Labs  Lab 03/13/19 0641  AST 30  ALT 19  ALKPHOS 70  BILITOT 0.6  PROT 4.8*  ALBUMIN 1.8*  No results for input(s): LIPASE, AMYLASE in the last 168 hours. No results for input(s): AMMONIA in the last 168 hours. Coagulation Profile: Recent Labs  Lab 03/19/19 0424  INR 1.1   Cardiac Enzymes: No results for input(s): CKTOTAL, CKMB, CKMBINDEX, TROPONINI in the last 168 hours. BNP (last 3 results) No results for input(s): PROBNP in the last 8760 hours. HbA1C: No results for input(s): HGBA1C in the last 72 hours. CBG: Recent Labs  Lab 03/18/19 1557 03/18/19 1929 03/19/19 0002 03/19/19 0352 03/19/19 0855  GLUCAP 152* 100* 103* 99 153*   Lipid Profile: No results for input(s): CHOL, HDL, LDLCALC, TRIG, CHOLHDL, LDLDIRECT in the last 72 hours. Thyroid Function Tests: No results for input(s): TSH,  T4TOTAL, FREET4, T3FREE, THYROIDAB in the last 72 hours. Anemia Panel: No results for input(s): VITAMINB12, FOLATE, FERRITIN, TIBC, IRON, RETICCTPCT in the last 72 hours. Urine analysis:    Component Value Date/Time   COLORURINE YELLOW 02/02/2019 1923   APPEARANCEUR CLEAR 02/02/2019 1923   LABSPEC 1.013 02/02/2019 1923   PHURINE 5.0 02/02/2019 1923   GLUCOSEU NEGATIVE 02/02/2019 1923   GLUCOSEU NEGATIVE 09/06/2017 1200   HGBUR SMALL (A) 02/02/2019 1923   BILIRUBINUR NEGATIVE 02/02/2019 1923   BILIRUBINUR Neg 10/18/2017 1453   KETONESUR NEGATIVE 02/02/2019 1923   PROTEINUR NEGATIVE 02/02/2019 1923   UROBILINOGEN 0.2 10/18/2017 1453   UROBILINOGEN 1.0 09/06/2017 1200   NITRITE POSITIVE (A) 02/02/2019 1923   LEUKOCYTESUR LARGE (A) 02/02/2019 1923   Sepsis Labs: @LABRCNTIP (procalcitonin:4,lacticidven:4)  )No results found for this or any previous visit (from the past 240 hour(s)).    Radiology Studies: No results found.   Scheduled Meds: . amiodarone  200 mg Oral Daily  . chlorhexidine  15 mL Mouth Rinse BID  . chlorhexidine gluconate (MEDLINE KIT)  15 mL Mouth Rinse BID  . Chlorhexidine Gluconate Cloth  6 each Topical Daily  . cholecalciferol  2,000 Units Oral BID  . diltiazem  120 mg Oral Daily  . feeding supplement (ENSURE ENLIVE)  237 mL Oral TID BM  . ferrous sulfate  300 mg Oral BID WC  . gabapentin  100 mg Oral TID  . Gerhardt's butt cream   Topical TID  . mouth rinse  15 mL Mouth Rinse q12n4p  . metoprolol tartrate  50 mg Oral BID  . multivitamin with minerals  1 tablet Oral Daily  . pantoprazole  40 mg Oral Daily  . pravastatin  20 mg Oral q1800  . senna-docusate  2 tablet Oral BID  . sodium chloride flush  10-40 mL Intracatheter Q12H  . sodium chloride flush  3 mL Intravenous Q12H  . Vitamin D (Ergocalciferol)  50,000 Units Oral Q7 days  . warfarin  2.5 mg Oral ONCE-1800  . Warfarin - Pharmacist Dosing Inpatient   Does not apply q1800   Continuous  Infusions: . sodium chloride 20 mL/hr at 03/14/19 2315  . sodium chloride Stopped (03/14/19 1958)  . cefTRIAXone (ROCEPHIN)  IV 2 g (03/18/19 2226)  . heparin 1,250 Units/hr (03/18/19 2057)     LOS: 24 days   Time Spent in minutes   30 minutes  Tiaira Arambula D.O. on 03/19/2019 at 11:37 AM  Between 7am to 7pm - Please see pager noted on [email or web address]  After 7pm go to [email or web address]  And look for the night coverage person covering for me after hours  [facility] Office  [phone]

## 2019-03-19 NOTE — Plan of Care (Signed)
Problem: Health Behavior/Discharge Planning: Goal: Ability to manage health-related needs will improve Outcome: Progressing   Problem: Clinical Measurements: Goal: Will remain free from infection Outcome: Progressing   Problem: Activity: Goal: Risk for activity intolerance will decrease Outcome: Progressing   Problem: Pain Managment: Goal: General experience of comfort will improve Outcome: Progressing   Problem: Skin Integrity: Goal: Risk for impaired skin integrity will decrease Outcome: Progressing   

## 2019-03-19 NOTE — Progress Notes (Signed)
Moderate blood noted on dressing this morning. Incision is black, red, and with surrounding skin pink, with an odor. Dressing changed.  Will continue to monitor

## 2019-03-19 NOTE — Progress Notes (Signed)
ANTICOAGULATION CONSULT NOTE - Follow Up Consult  Pharmacy Consult for Heparin and Warfarin Indication: atrial fibrillation  Allergies  Allergen Reactions  . Aspirin Hives and Swelling    Angioedema   . Rofecoxib Hives  . Hydrocodone Hives    Tolerates oxycodone and tramadol    Patient Measurements: Height: 5\' 7"  (170.2 cm) Weight: 227 lb 1.2 oz (103 kg) IBW/kg (Calculated) : 61.6 Heparin dosing weight = 83 kg  Vital Signs: Temp: 98.8 F (37.1 C) (12/13 0351) Temp Source: Oral (12/13 0351) BP: 132/67 (12/13 0351) Pulse Rate: 88 (12/13 0351)  Labs: Recent Labs    03/17/19 0024 03/18/19 0858 03/18/19 1833 03/19/19 0424  HGB 8.7* 9.0*  --  8.5*  HCT 27.4* 28.4*  --  26.4*  PLT 176 229  --  166  LABPROT  --   --   --  14.2  INR  --   --   --  1.1  HEPARINUNFRC 0.21* 0.96* 0.65 0.55  CREATININE 0.93  --   --   --     Estimated Creatinine Clearance: 60.6 mL/min (by C-G formula based on SCr of 0.93 mg/dL).  Assessment: 62 YOF with history of Afib on Coumadin PTA.  INR elevated on admit and was reversed with Vitamin K and then transitioned to IV heparin.  Heparin subsequently held due to anemia and now resumed. Warfarin also resumed yesterday.  Heparin level is therapeutic at 0.55. INR is subtherapeutic at 1.1. CBC stable. RN notes moderate blood on wound dressing, no other bleeding reported.  Goal of Therapy:  Heparin level 0.3-0.7 units/ml Monitor platelets by anticoagulation protocol: Yes   Plan:  Continue heparin infusion at 1250 units/hr Warfarin 2.5 mg once tonight Monitor daily heparin level, INR, CBC, and s/sx of bleeding  Berenice Bouton, PharmD PGY1 Pharmacy Resident Phone until 3:30 pm: [phone] 03/19/2019, 7:20 AM

## 2019-03-20 LAB — BASIC METABOLIC PANEL
Anion gap: 10 (ref 5–15)
BUN: 14 mg/dL (ref 8–23)
CO2: 26 mmol/L (ref 22–32)
Calcium: 8.1 mg/dL — ABNORMAL LOW (ref 8.9–10.3)
Chloride: 103 mmol/L (ref 98–111)
Creatinine, Ser: 0.95 mg/dL (ref 0.44–1.00)
GFR calc Af Amer: >60 mL/min (ref >60)
GFR calc non Af Amer: 57 mL/min — ABNORMAL LOW (ref >60)
Glucose, Bld: 133 mg/dL — ABNORMAL HIGH (ref 70–99)
Potassium: 4.7 mmol/L (ref 3.5–5.1)
Sodium: 139 mmol/L (ref 135–145)

## 2019-03-20 LAB — HEPARIN LEVEL (UNFRACTIONATED): Heparin Unfractionated: 0.47 IU/mL (ref 0.30–0.70)

## 2019-03-20 LAB — CBC
HCT: 19.1 % — ABNORMAL LOW (ref 36.0–46.0)
Hemoglobin: 6 g/dL — CL (ref 12.0–15.0)
MCH: 29.9 pg (ref 26.0–34.0)
MCHC: 31.4 g/dL (ref 30.0–36.0)
MCV: 95 fL (ref 80.0–100.0)
Platelets: 208 10*3/uL (ref 150–400)
RBC: 2.01 MIL/uL — ABNORMAL LOW (ref 3.87–5.11)
RDW: 18.8 % — ABNORMAL HIGH (ref 11.5–15.5)
WBC: 17.6 10*3/uL — ABNORMAL HIGH (ref 4.0–10.5)
nRBC: 0.3 % — ABNORMAL HIGH (ref 0.0–0.2)

## 2019-03-20 LAB — GLUCOSE, CAPILLARY
Glucose-Capillary: 133 mg/dL — ABNORMAL HIGH (ref 70–99)
Glucose-Capillary: 125 mg/dL — ABNORMAL HIGH (ref 70–99)
Glucose-Capillary: 141 mg/dL — ABNORMAL HIGH (ref 70–99)
Glucose-Capillary: 151 mg/dL — ABNORMAL HIGH (ref 70–99)
Glucose-Capillary: 122 mg/dL — ABNORMAL HIGH (ref 70–99)

## 2019-03-20 LAB — PROTIME-INR
INR: 1.3 — ABNORMAL HIGH (ref 0.8–1.2)
Prothrombin Time: 15.9 seconds — ABNORMAL HIGH (ref 11.4–15.2)

## 2019-03-20 LAB — HEMOGLOBIN AND HEMATOCRIT, BLOOD
HCT: 22.2 % — ABNORMAL LOW (ref 36.0–46.0)
Hemoglobin: 7.4 g/dL — ABNORMAL LOW (ref 12.0–15.0)

## 2019-03-20 LAB — PREPARE RBC (CROSSMATCH)

## 2019-03-20 LAB — MAGNESIUM: Magnesium: 1.8 mg/dL (ref 1.7–2.4)

## 2019-03-20 MED ORDER — SODIUM CHLORIDE 0.9% IV SOLUTION
Freq: Once | INTRAVENOUS | Status: AC
  Administered 2019-03-20: 13:00:00 via INTRAVENOUS

## 2019-03-20 NOTE — Progress Notes (Signed)
PT Cancellation Note  Patient Details Name: Robin Snow MRN: [id number] DOB: December 08, 1940   Cancelled Treatment:    Reason Eval/Treat Not Completed: Medical issues which prohibited therapy Pt with hgb of 6.0  And currently getting PRBC.  Pt has been max A of 2.  Will hold PT today due to low hgb.  Will f/u as able.  Maggie Font, PT Acute Rehab Services Pager [phone] [facility] [phone]   Karlton Lemon 03/20/2019, 2:45 PM

## 2019-03-20 NOTE — Progress Notes (Signed)
Dr Ree Kida made rounds and we both have spoken in regards to the patient's incision looking red and swollen.  Staples in place.  The dressing was replaced with 3 abd pads and compression wrap.

## 2019-03-20 NOTE — Progress Notes (Signed)
Asleep but arousable.  Some increased bleeding at stump but no wound dehiscence .   Agree discontinue Heparin.

## 2019-03-20 NOTE — Progress Notes (Signed)
1st unit of PRBCs started via pump.  The patient was educated on blood products and to call if there is a problem or complaint.

## 2019-03-20 NOTE — Progress Notes (Signed)
The patient appears to sleep and will open eyes when spoken to.  The right leg stump is elevated on a pillow.  The dressing (abd pads, compression wrap) are off of the leg.  The incision appears very red and swollen.

## 2019-03-20 NOTE — Progress Notes (Signed)
PROGRESS NOTE    Robin Snow  WUJ:[id number] DOB: [birth year] DOA: 02/22/2019 PCP: [name], MD   Brief Narrative:  HPI on 02/22/2019 by Dr. Madelyn Flavors Robin Snow is a 78 y.o. female with medical history significant of paroxysmal atrial fibrillation on anticoagulation Coumadin, hypertension, diabetes mellitus type II, and chronic kidney disease stage III.  She presents with complaints right knee pain and swelling. Hospitalized from 10/29-11/4 with sepsis related to E. coli bacteremia, UTI, and bacterial arthritis right knee.  Treated with Rocephin, then switched to Ancef, and discharged home to on Keflex to complete a 10-day course.  Patient had initially done okay when she got home and had completed antibiotics as prescribed.  3 days ago while the daughter was helping her transition she had heard a loud pop in the right knee.  Since that time she has had increasing pain and swelling of the right knee to the point in which she started developing fluid-filled blisters on her skin.  Associated symptoms include increased warmth, but the patient has not had any fever.  She had gone from being unable to bear weight on the right knee and now cannot have anyone touch it without crying out in pain.  Denies any significant nausea, vomiting, cough, chest pain, shortness of breath, diarrhea, blood in stools, or dysuria symptoms.  Interim history Patient presented with prosthetic dislocation abscess, status post closed reduction and drainage on 02/22/2019.  Currently being treated for E. coli bacteremia.  Patient underwent right AKA on 03/01/2019 and was intubated after the procedure for respiratory failure.  While in the ICU patient did have positive bacteremia and TEE TEE was concerning for endocarditis.  ID consulted, patient will need antibiotics through 04/12/2019.   Patient had been restarted on heparin and monitored, her hemoglobin was stable for days.  Was then restarted on  Coumadin, at which point her hemoglobin continue to be stable.  However overnight patient's hemoglobin dropped from 8.5 to 6 the morning of 03/20/2019.  Patient to receive 2 units PRBC.  Coumadin and heparin discontinued.  Assessment & Plan   Sepsis secondary to endocarditis, E. coli bacteremia in the context of infected knee hardware -sepsis present on admission -Orthopedic surgery consulted and appreciated, status post right AKA -TEE completed on 03/08/2019 with evidence of mobile mass and thickening of the aortic valve suspicious for aortic valvular vegetation  -Infectious disease consulted and appreciated recommended 6 weeks of Rocephin with a tentative stop date of 04/12/2019, PICC line placed -ID did not advise removal of pacemaker at this time -Pending orthopedic recommendations regarding possible hip disarticulation in setting of muscle necrosis-discussed with orthopedics, Dr. Lajoyce Corners, would continue wound VAC for an additional week and have her follow-up as an outpatient.  No further surgery needed at this time. -PT, OT recommending CIR.  Patient appears to be a good candidate for inpatient rehab given her willingness to work with therapy. -Had peer to peer with insurance company however patient was denied CIR placement.  SNF was recommended. -Concerned regarding patient's R stump, though no wound dehiscence noted, skin discoloration above incision.  Discussed with orthopedic surgery- Dr. Lajoyce Corners, does not believe that patient's blood loss is in the stump.  Will continue to monitor  Acute onset of acute blood loss anemia/antibiotic anemia -Patient did have a hemoglobin drop of 5.9 and did receive 2 units of PRBC on 03/14/2019 -Hemoglobin down to 6 today, patient will receive 2 units PRBC.  Patient also noted to have RVR overnight. -Continue  iron supplementation -Heparin and Coumadin discontinued -Monitor CBC  Postoperative respiratory failure requiring mechanical ventilation -Resolved -In the  setting of E. coli bacteremia and recent AKA with significant tach and -Patient was extubated on 03/09/2019 -Continue incentive spirometry -Currently on room air and maintaining oxygen saturations into the 90s  Atrial fibrillation/sick sinus syndrome -Status post pacemaker -Patient did require amiodarone drip, however now is rate controlled -Home anticoagulation of Coumadin held given her postop anemia -Continue amiodarone, diltiazem, metoprolol -Heparin and Coumadin both held -Overnight, 12/13-14, patient did have RVR.  This was likely secondary to her blood loss.  Protein calorie malnutrition -Continue nutrition supplements  Electrolyte abnormalities -Resolving, monitor labs and replace as needed  DVT Prophylaxis  SCDs  Code Status: Full  Family Communication: None at bedside  Disposition Plan: Admitted.  Disposition to SNF when patient medically stable.  Now with anemia and requiring blood transfusion.  Consultants Orthopedic surgery Infectious disease PCCM  Procedures  Closed reduction right knee process attic, drainage Right AKA Intubation/extubation  Antibiotics   Anti-infectives (From admission, onward)   Start     Dose/Rate Route Frequency Ordered Stop   03/02/19 0600  ceFAZolin (ANCEF) IVPB 2g/100 mL premix     2 g 200 mL/hr over 30 Minutes Intravenous On call to O.R. 03/01/19 1304 03/01/19 1526   02/23/19 0345  cefTRIAXone (ROCEPHIN) 2 g in sodium chloride 0.9 % 100 mL IVPB     2 g 200 mL/hr over 30 Minutes Intravenous Daily at bedtime 02/23/19 0336 04/12/19 2359      Subjective:   Robin Snow seen and examined today.  Patient continues to have pain particularly in the thigh.  Denies current chest pain, shortness of breath, abdominal pain, nausea or vomiting, diarrhea or constipation, dizziness or headache. Objective:   Vitals:   03/20/19 0341 03/20/19 0500 03/20/19 0724 03/20/19 1021  BP: (!) 109/55  (!) 84/46 (!) 84/46  Pulse: (!) 107  94 94   Resp: 16  17   Temp: 99 F (37.2 C)  98.8 F (37.1 C)   TempSrc: Axillary     SpO2: 98%  100%   Weight:  102.1 kg    Height:        Intake/Output Summary (Last 24 hours) at 03/20/2019 1119 Last data filed at 03/20/2019 1022 Gross per 24 hour  Intake 473.14 ml  Output 1050 ml  Net -576.86 ml   Filed Weights   03/14/19 0445 03/16/19 0600 03/20/19 0500  Weight: 99.9 kg 103 kg 102.1 kg   Exam  General: Well developed, chronically ill-appearing, NAD  HEENT: NCAT, mucous membranes moist.   Cardiovascular: S1 S2 auscultated, irregular  Respiratory: Diminished breath sounds   Abdomen: Soft, nontender, nondistended, + bowel sounds  Extremities: R AKA with oozing, no wound dehiscence.  Neuro: AAOx3, nonfocal  Skin: Without rashes exudates or nodules, skin discoloration above incision on right AKA  Data Reviewed: I have personally reviewed following labs and imaging studies  CBC: Recent Labs  Lab 03/16/19 0517 03/17/19 0024 03/18/19 0858 03/19/19 0424 03/20/19 0314  WBC 4.8 4.7 5.6 5.7 17.6*  HGB 8.8* 8.7* 9.0* 8.5* 6.0*  HCT 28.1* 27.4* 28.4* 26.4* 19.1*  MCV 95.6 93.8 95.6 94.3 95.0  PLT 197 176 229 166 208   Basic Metabolic Panel: Recent Labs  Lab 03/14/19 0444 03/17/19 0024 03/20/19 0314  NA 137 136 139  K 4.3 4.3 4.7  CL 101 101 103  CO2 27 27 26   GLUCOSE 90 120* 133*  BUN 13  14 14  CREATININE 0.80 0.93 0.95  CALCIUM 7.9* 7.7* 8.1*  MG 1.8  --  1.8   GFR: Estimated Creatinine Clearance: 59 mL/min (by C-G formula based on SCr of 0.95 mg/dL). Liver Function Tests: No results for input(s): AST, ALT, ALKPHOS, BILITOT, PROT, ALBUMIN in the last 168 hours. No results for input(s): LIPASE, AMYLASE in the last 168 hours. No results for input(s): AMMONIA in the last 168 hours. Coagulation Profile: Recent Labs  Lab 03/19/19 0424 03/20/19 0314  INR 1.1 1.3*   Cardiac Enzymes: No results for input(s): CKTOTAL, CKMB, CKMBINDEX, TROPONINI in the last  168 hours. BNP (last 3 results) No results for input(s): PROBNP in the last 8760 hours. HbA1C: No results for input(s): HGBA1C in the last 72 hours. CBG: Recent Labs  Lab 03/19/19 1612 03/19/19 1940 03/19/19 2334 03/20/19 0342 03/20/19 1000  GLUCAP 148* 128* 120* 133* 125*   Lipid Profile: No results for input(s): CHOL, HDL, LDLCALC, TRIG, CHOLHDL, LDLDIRECT in the last 72 hours. Thyroid Function Tests: No results for input(s): TSH, T4TOTAL, FREET4, T3FREE, THYROIDAB in the last 72 hours. Anemia Panel: No results for input(s): VITAMINB12, FOLATE, FERRITIN, TIBC, IRON, RETICCTPCT in the last 72 hours. Urine analysis:    Component Value Date/Time   COLORURINE YELLOW 02/02/2019 1923   APPEARANCEUR CLEAR 02/02/2019 1923   LABSPEC 1.013 02/02/2019 1923   PHURINE 5.0 02/02/2019 1923   GLUCOSEU NEGATIVE 02/02/2019 1923   GLUCOSEU NEGATIVE 09/06/2017 1200   HGBUR SMALL (A) 02/02/2019 1923   BILIRUBINUR NEGATIVE 02/02/2019 1923   BILIRUBINUR Neg 10/18/2017 1453   KETONESUR NEGATIVE 02/02/2019 1923   PROTEINUR NEGATIVE 02/02/2019 1923   UROBILINOGEN 0.2 10/18/2017 1453   UROBILINOGEN 1.0 09/06/2017 1200   NITRITE POSITIVE (A) 02/02/2019 1923   LEUKOCYTESUR LARGE (A) 02/02/2019 1923   Sepsis Labs: @LABRCNTIP (procalcitonin:4,lacticidven:4)  )No results found for this or any previous visit (from the past 240 hour(s)).    Radiology Studies: No results found.   Scheduled Meds: . sodium chloride   Intravenous Once  . amiodarone  200 mg Oral Daily  . chlorhexidine  15 mL Mouth Rinse BID  . chlorhexidine gluconate (MEDLINE KIT)  15 mL Mouth Rinse BID  . Chlorhexidine Gluconate Cloth  6 each Topical Daily  . cholecalciferol  2,000 Units Oral BID  . diltiazem  120 mg Oral Daily  . feeding supplement (ENSURE ENLIVE)  237 mL Oral TID BM  . ferrous sulfate  300 mg Oral BID WC  . gabapentin  100 mg Oral TID  . Gerhardt's butt cream   Topical TID  . mouth rinse  15 mL Mouth Rinse  q12n4p  . metoprolol tartrate  50 mg Oral BID  . multivitamin with minerals  1 tablet Oral Daily  . pantoprazole  40 mg Oral Daily  . pravastatin  20 mg Oral q1800  . senna-docusate  2 tablet Oral BID  . sodium chloride flush  10-40 mL Intracatheter Q12H  . sodium chloride flush  3 mL Intravenous Q12H  . Vitamin D (Ergocalciferol)  50,000 Units Oral Q7 days   Continuous Infusions: . sodium chloride 20 mL/hr at 03/14/19 2315  . sodium chloride Stopped (03/14/19 1958)  . cefTRIAXone (ROCEPHIN)  IV 2 g (03/19/19 2134)     LOS: 25 days   Time Spent in minutes   45 minutes  Robin Snow D.O. on 03/20/2019 at 11:19 AM  Between 7am to 7pm - Please see pager noted on [email or web address]  After 7pm go to [email or web address]  And look for the night coverage person covering for me after hours  [facility] Office  [phone]

## 2019-03-20 NOTE — Progress Notes (Addendum)
Blood transfusion completion @2029 . Pt stable. Pulse 117, Dr. Baltazar Najjar notified. H&H ordered for 2 hours post blood transfusion completion. Will continue to monitor pt.  Hgb 7.4 @2314   Sacral foam placed d/t skin redness and breakdown. Cream applied. Noted sore forming on right posterior thigh, cleansed will continue to monitor.  Dressing change complete @0600  to R AKA. Moderate amount of drainage. Area swollen, bleeding, red/purple. 2 abd pads, 1 vasoline gauze, 1 large and 1 small ace wrap applied.

## 2019-03-20 NOTE — Progress Notes (Signed)
1st unit of PRBCs completed without difficulty.  No distress noted.

## 2019-03-20 NOTE — Progress Notes (Signed)
2nd unit of PRBCs started-  The patient has been very drowsy today but will arouse when spoken to.  All extremities appear edematous and the patient appears to have pain when repositioning or with movement.  Foul smelling odor noted from the right amputated leg.  Dressing has been changed small to moderate amount of bloody drainage noted.  The right AKA is elevated on a pillow.  Complete bed linens have been changed and the patient repositioned for comfort.

## 2019-03-20 NOTE — Progress Notes (Signed)
Received call from lab of a hemoglobin of 6.0.  Moderate amount of drainage/blood from incision overnight noted on dressing, with odor.   Notified On-call MD. New orders to Hold heparin, collect type and screen, and transfuse 2 units of blood were obtained.  Awaiting type and screen

## 2019-03-21 LAB — BPAM RBC
Blood Product Expiration Date: 202012292359
Blood Product Expiration Date: 202101092359
ISSUE DATE / TIME: 202012141233
ISSUE DATE / TIME: 202012141650
Unit Type and Rh: 5100
Unit Type and Rh: 5100

## 2019-03-21 LAB — TYPE AND SCREEN
ABO/RH(D): A POS
Antibody Screen: POSITIVE
Donor AG Type: NEGATIVE
Donor AG Type: NEGATIVE
Unit division: 0
Unit division: 0

## 2019-03-21 LAB — GLUCOSE, CAPILLARY
Glucose-Capillary: 137 mg/dL — ABNORMAL HIGH (ref 70–99)
Glucose-Capillary: 112 mg/dL — ABNORMAL HIGH (ref 70–99)
Glucose-Capillary: 115 mg/dL — ABNORMAL HIGH (ref 70–99)
Glucose-Capillary: 104 mg/dL — ABNORMAL HIGH (ref 70–99)

## 2019-03-21 LAB — BASIC METABOLIC PANEL
Anion gap: 8 (ref 5–15)
BUN: 25 mg/dL — ABNORMAL HIGH (ref 8–23)
CO2: 27 mmol/L (ref 22–32)
Calcium: 7.6 mg/dL — ABNORMAL LOW (ref 8.9–10.3)
Chloride: 103 mmol/L (ref 98–111)
Creatinine, Ser: 1.34 mg/dL — ABNORMAL HIGH (ref 0.44–1.00)
GFR calc Af Amer: 44 mL/min — ABNORMAL LOW (ref >60)
GFR calc non Af Amer: 38 mL/min — ABNORMAL LOW (ref >60)
Glucose, Bld: 120 mg/dL — ABNORMAL HIGH (ref 70–99)
Potassium: 4.5 mmol/L (ref 3.5–5.1)
Sodium: 138 mmol/L (ref 135–145)

## 2019-03-21 LAB — CBC
HCT: 23.2 % — ABNORMAL LOW (ref 36.0–46.0)
Hemoglobin: 7.6 g/dL — ABNORMAL LOW (ref 12.0–15.0)
MCH: 31 pg (ref 26.0–34.0)
MCHC: 32.8 g/dL (ref 30.0–36.0)
MCV: 94.7 fL (ref 80.0–100.0)
Platelets: 193 10*3/uL (ref 150–400)
RBC: 2.45 MIL/uL — ABNORMAL LOW (ref 3.87–5.11)
RDW: 17.5 % — ABNORMAL HIGH (ref 11.5–15.5)
WBC: 16.4 10*3/uL — ABNORMAL HIGH (ref 4.0–10.5)
nRBC: 1.5 % — ABNORMAL HIGH (ref 0.0–0.2)

## 2019-03-21 LAB — PROTIME-INR
INR: 1.1 (ref 0.8–1.2)
Prothrombin Time: 14.1 seconds (ref 11.4–15.2)

## 2019-03-21 NOTE — Progress Notes (Signed)
Nutrition Follow-up  DOCUMENTATION CODES:   Obesity unspecified  INTERVENTION:   -Continue MVI with minerals daily -Continue Ensure Enlive po to TID, each supplement provides 350 kcal and 20 grams of protein -Continue Magic cup TID with meals, each supplement provides 290 kcal and 9 grams of protein  NUTRITION DIAGNOSIS:   Increased nutrient needs related to wound healing as evidenced by estimated needs.  Ongoing  GOAL:   Patient will meet greater than or equal to 90% of their needs  Progressing   MONITOR:   PO intake, Supplement acceptance, Labs, Weight trends, Skin  REASON FOR ASSESSMENT:   Consult, Ventilator Enteral/tube feeding initiation and management  ASSESSMENT:   Pt with PMH of DM, HTN, morbid obesity, and GER who had R knee replacement s/p revision in 2018 now admitted with prosthetic dislocation and abscess s/p closed reduction and drainage 11/18 treating for e.coli bacteremia now s/p R AKA 11/25 with wound VAC.  11/28 trickle TF started 12/2 TEE 12/3extubated 12/4 diet advanced (dysphagia 3 diet with thin liquids) 12/8- wound vac d/c by orthopedics  Reviewed I/O's: +321 ml x 24 hours and +1.4 L since 03/07/19  UOP: 400 ml x 24 hours  Pt sleeping soundly at visit and did not respond to voice.   Pt with good appetite, which has improved. Meal completion 20-100%. Pt also consuming Ensure supplements.   Per orthopedics notes, pt medically stable for discharge. Pt and family hopeful for CIR admission  Labs reviewed: CBGS: 104-137.  Diet Order:   Diet Order            Diet Heart Room service appropriate? Yes; Fluid consistency: Thin  Diet effective now              EDUCATION NEEDS:   No education needs have been identified at this time  Skin:  Skin Assessment: Skin Integrity Issues: Skin Integrity Issues:: Stage II, Incisions Stage II: buttocks, R thigh Incisions: rt AKA, wound vac d/c on 12/8  Last BM:  03/18/19  Height:   Ht  Readings from Last 1 Encounters:  03/01/19 5\' 7"  (1.702 m)    Weight:   Wt Readings from Last 1 Encounters:  03/20/19 102.1 kg    Ideal Body Weight:  56.3 kg(adjusted for R AKA)  BMI:  Body mass index is 35.25 kg/m.  Estimated Nutritional Needs:   Kcal:  1900-2200  Protein:  115-130 grams  Fluid:  > 1.9 L/day    Rayan Ines A. Jimmye Norman, RD, LDN, [place] Registered Dietitian II Certified Diabetes Care and Education Specialist Pager: [phone] After hours Pager: [phone]

## 2019-03-21 NOTE — Progress Notes (Signed)
Physical Therapy Treatment Patient Details Name: Robin Snow MRN: [id number] DOB: [birth year] Today's Date: 03/21/2019    History of Present Illness 78  y/o female with history of atrial fibrillation, HTN, DM,CKD III had a fall at home when patient was trying to get out of the bed. Hx of R knee pain and swelling for the last few months. Found with sepsis secondary to E. coli bacteremia. Hospitalized from 10/29-11/4 with sepsis related to E. coli bacteremia, UTI, and bacterial arthritis right knee. Found to have dislocated right knee prothestic--02/22/19 s/p Closed reduction of right knee prosthetic dislocation. 11/25 underwent R AKA intubated with respiratory failure extubation 03/09/19    PT Comments    Pt with increased lethargy/drowsiness today.  Pt was able to participate minimally with LE therex and long sitting in bed.  Per chart review, pt did not qualify for CIR, therefore recommend SNF at this time.   Follow Up Recommendations  SNF     Equipment Recommendations  None recommended by PT    Recommendations for Other Services       Precautions / Restrictions Restrictions RLE Weight Bearing: Non weight bearing Other Position/Activity Restrictions: R AKA    Mobility  Bed Mobility Overal bed mobility: Needs Assistance             General bed mobility comments: Pt too fatigued/lethargic for EOB.  Worked on long sitting with HOB down for trunk strengthening.  Once trunk supported, immediately fell back asleep  Transfers                 General transfer comment: too lethargic to attempt  Ambulation/Gait                 Stairs             Wheelchair Mobility    Modified Rankin (Stroke Patients Only)       Balance                                            Cognition Arousal/Alertness: Lethargic Behavior During Therapy: Flat affect Overall Cognitive Status: No family/caregiver present to determine baseline cognitive  functioning                     Current Attention Level: Focused Memory: Decreased short-term memory       Problem Solving: Slow processing;Decreased initiation;Requires verbal cues;Difficulty sequencing;Requires tactile cues General Comments: Nurse reports pt has been drowsy all day and therefore no pain meds prior to treatment      Exercises Total Joint Exercises Ankle Circles/Pumps: AROM;Left;10 reps Quad Sets: AROM;Left;10 reps    General Comments General comments (skin integrity, edema, etc.): Pt lethargic and able to do minimal exercises      Pertinent Vitals/Pain Pain Assessment: Faces Faces Pain Scale: Hurts little more Pain Location: R AKA Pain Descriptors / Indicators: Grimacing    Home Living                      Prior Function            PT Goals (current goals can now be found in the care plan section) Acute Rehab PT Goals Potential to Achieve Goals: Fair Progress towards PT goals: Not progressing toward goals - comment(lethargic today)    Frequency    Min 3X/week      PT Plan  Discharge plan needs to be updated    Co-evaluation              AM-PAC PT "6 Clicks" Mobility   Outcome Measure  Help needed turning from your back to your side while in a flat bed without using bedrails?: Total Help needed moving from lying on your back to sitting on the side of a flat bed without using bedrails?: Total Help needed moving to and from a bed to a chair (including a wheelchair)?: Total Help needed standing up from a chair using your arms (e.g., wheelchair or bedside chair)?: Total Help needed to walk in hospital room?: Total Help needed climbing 3-5 steps with a railing? : Total 6 Click Score: 6    End of Session   Activity Tolerance: Patient limited by lethargy Patient left: in bed;with call bell/phone within reach;with chair alarm set Nurse Communication: Mobility status PT Visit Diagnosis: Difficulty in walking, not  elsewhere classified (R26.2);History of falling (Z91.81);Muscle weakness (generalized) (M62.81) Pain - Right/Left: Right Pain - part of body: Leg     Time: 1319-1340 PT Time Calculation (min) (ACUTE ONLY): 21 min  Charges:  $Therapeutic Activity: 8-22 mins                     Jenaveve Fenstermaker L. Katrinka Blazing, [place] Pager [phone] 03/21/2019    Enzo Montgomery 03/21/2019, 2:23 PM

## 2019-03-21 NOTE — Plan of Care (Signed)
  Problem: Activity: Goal: Risk for activity intolerance will decrease Outcome: Progressing   Problem: Pain Managment: Goal: General experience of comfort will improve Outcome: Progressing   Problem: Skin Integrity: Goal: Risk for impaired skin integrity will decrease Outcome: Progressing   

## 2019-03-21 NOTE — Plan of Care (Signed)
  Problem: Skin Integrity: Goal: Risk for impaired skin integrity will decrease Outcome: Progressing   

## 2019-03-21 NOTE — Progress Notes (Signed)
PROGRESS NOTE    Robin Snow  OZD:[id number] DOB: Feb 29, 1941 DOA: 02/22/2019 PCP: [name], MD   Brief Narrative:  HPI on 02/22/2019 by Dr. Madelyn Flavors Robin Snow is a 78 y.o. female with medical history significant of paroxysmal atrial fibrillation on anticoagulation Coumadin, hypertension, diabetes mellitus type II, and chronic kidney disease stage III.  She presents with complaints right knee pain and swelling. Hospitalized from 10/29-11/4 with sepsis related to E. coli bacteremia, UTI, and bacterial arthritis right knee.  Treated with Rocephin, then switched to Ancef, and discharged home to on Keflex to complete a 10-day course.  Patient had initially done okay when she got home and had completed antibiotics as prescribed.  3 days ago while the daughter was helping her transition she had heard a loud pop in the right knee.  Since that time she has had increasing pain and swelling of the right knee to the point in which she started developing fluid-filled blisters on her skin.  Associated symptoms include increased warmth, but the patient has not had any fever.  She had gone from being unable to bear weight on the right knee and now cannot have anyone touch it without crying out in pain.  Denies any significant nausea, vomiting, cough, chest pain, shortness of breath, diarrhea, blood in stools, or dysuria symptoms.  Interim history Patient presented with prosthetic dislocation abscess, status post closed reduction and drainage on 02/22/2019.  Currently being treated for E. coli bacteremia.  Patient underwent right AKA on 03/01/2019 and was intubated after the procedure for respiratory failure.  While in the ICU patient did have positive bacteremia and TEE TEE was concerning for endocarditis.  ID consulted, patient will need antibiotics through 04/12/2019.   Patient had been restarted on heparin and monitored, her hemoglobin was stable for days.  Was then restarted on  Coumadin, at which point her hemoglobin continue to be stable.  However overnight patient's hemoglobin dropped from 8.5 to 6 the morning of 03/20/2019.  Patient to receive 2 units PRBC.  Coumadin and heparin discontinued.  Assessment & Plan   Sepsis secondary to endocarditis, E. coli bacteremia in the context of infected knee hardware -sepsis present on admission -Orthopedic surgery consulted and appreciated, status post right AKA -TEE completed on 03/08/2019 with evidence of mobile mass and thickening of the aortic valve suspicious for aortic valvular vegetation  -Infectious disease consulted and appreciated recommended 6 weeks of Rocephin with a tentative stop date of 04/12/2019, PICC line placed -ID did not advise removal of pacemaker at this time -Pending orthopedic recommendations regarding possible hip disarticulation in setting of muscle necrosis-discussed with orthopedics, Dr. Lajoyce Corners, would continue wound VAC for an additional week and have her follow-up as an outpatient.  No further surgery needed at this time. -PT, OT recommending CIR.  Patient appears to be a good candidate for inpatient rehab given her willingness to work with therapy. -Had peer to peer with insurance company however patient was denied CIR placement.  SNF was recommended. -Concerned regarding patient's R stump, though no wound dehiscence noted, skin discoloration above incision.  Discussed with orthopedic surgery- Dr. Lajoyce Corners, does not believe that patient's blood loss is in the stump.  Will continue to monitor  Acute onset of acute blood loss anemia/symptomatic anemia -Patient did have a hemoglobin drop of 5.9 and did receive 2 units of PRBC on 03/14/2019 -Hemoglobin down to 6 on 03/20/2019, received 2u PRBC, hemoglobin currently 7.6 -Continue iron supplementation -Heparin and Coumadin discontinued -Monitor  CBC  Postoperative respiratory failure requiring mechanical ventilation -Resolved -In the setting of E. coli  bacteremia and recent AKA with significant tach and -Patient was extubated on 03/09/2019 -Continue incentive spirometry -Currently on room air and maintaining oxygen saturations into the 90s  Atrial fibrillation/sick sinus syndrome -Status post pacemaker -Patient did require amiodarone drip, however now is rate controlled -Home anticoagulation of Coumadin held given her postop anemia -Continue amiodarone, diltiazem, metoprolol -Heparin and Coumadin both held -Overnight, 12/13-14, patient did have RVR.  This was likely secondary to her blood loss.  Protein calorie malnutrition -Continue nutrition supplements  Electrolyte abnormalities -Resolving, monitor labs and replace as needed  DVT Prophylaxis  SCDs  Code Status: Full  Family Communication: None at bedside  Disposition Plan: Admitted.  Disposition to SNF when patient medically stable.  Now with anemia and requiring blood transfusion.  Consultants Orthopedic surgery Infectious disease PCCM  Procedures  Closed reduction right knee prosthetic, drainage Right AKA Intubation/extubation  Antibiotics   Anti-infectives (From admission, onward)   Start     Dose/Rate Route Frequency Ordered Stop   03/02/19 0600  ceFAZolin (ANCEF) IVPB 2g/100 mL premix     2 g 200 mL/hr over 30 Minutes Intravenous On call to O.R. 03/01/19 1304 03/01/19 1526   02/23/19 0345  cefTRIAXone (ROCEPHIN) 2 g in sodium chloride 0.9 % 100 mL IVPB     2 g 200 mL/hr over 30 Minutes Intravenous Daily at bedtime 02/23/19 0336 04/12/19 2359      Subjective:   Ailanie Howman seen and examined today.  Patient continues to have pain but feels it is more controlled.  Denies current chest pain, shortness breath, abdominal pain, nausea vomiting, diarrhea constipation, dizziness or headache. Objective:   Vitals:   03/21/19 0412 03/21/19 0417 03/21/19 0753 03/21/19 1309  BP: (!) 97/56 106/61 115/60 102/60  Pulse: 90 96 98 97  Resp:   18 17  Temp: 98.7 F  (37.1 C)  97.6 F (36.4 C) 98.8 F (37.1 C)  TempSrc: Oral  Oral Oral  SpO2: 97%  97% 95%  Weight:      Height:        Intake/Output Summary (Last 24 hours) at 03/21/2019 1522 Last data filed at 03/21/2019 1309 Gross per 24 hour  Intake 3 ml  Output 400 ml  Net -397 ml   Filed Weights   03/14/19 0445 03/16/19 0600 03/20/19 0500  Weight: 99.9 kg 103 kg 102.1 kg   Exam  General: Well developed, chronically ill appearing, NAD  HEENT: NCAT, mucous membranes moist.   Cardiovascular: S1 S2 auscultated, irregular   Respiratory: Clear to auscultation bilaterally   Abdomen: Soft, nontender, nondistended, + bowel sounds  Extremities: R AKA with dressing in place. LLE edema  Neuro: AAOx3, nonfocal Data Reviewed: I have personally reviewed following labs and imaging studies  CBC: Recent Labs  Lab 03/17/19 0024 03/18/19 0858 03/19/19 0424 03/20/19 0314 03/20/19 2314 03/21/19 0456  WBC 4.7 5.6 5.7 17.6*  --  16.4*  HGB 8.7* 9.0* 8.5* 6.0* 7.4* 7.6*  HCT 27.4* 28.4* 26.4* 19.1* 22.2* 23.2*  MCV 93.8 95.6 94.3 95.0  --  94.7  PLT 176 229 166 208  --  193   Basic Metabolic Panel: Recent Labs  Lab 03/17/19 0024 03/20/19 0314 03/21/19 0456  NA 136 139 138  K 4.3 4.7 4.5  CL 101 103 103  CO2 27 26 27   GLUCOSE 120* 133* 120*  BUN 14 14 25*  CREATININE 0.93 0.95 1.34*  CALCIUM 7.7* 8.1* 7.6*  MG  --  1.8  --    GFR: Estimated Creatinine Clearance: 41.8 mL/min (A) (by C-G formula based on SCr of 1.34 mg/dL (H)). Liver Function Tests: No results for input(s): AST, ALT, ALKPHOS, BILITOT, PROT, ALBUMIN in the last 168 hours. No results for input(s): LIPASE, AMYLASE in the last 168 hours. No results for input(s): AMMONIA in the last 168 hours. Coagulation Profile: Recent Labs  Lab 03/19/19 0424 03/20/19 0314 03/21/19 0456  INR 1.1 1.3* 1.1   Cardiac Enzymes: No results for input(s): CKTOTAL, CKMB, CKMBINDEX, TROPONINI in the last 168 hours. BNP (last 3  results) No results for input(s): PROBNP in the last 8760 hours. HbA1C: No results for input(s): HGBA1C in the last 72 hours. CBG: Recent Labs  Lab 03/20/19 2100 03/21/19 0055 03/21/19 0447 03/21/19 0755 03/21/19 1156  GLUCAP 122* 137* 112* 115* 104*   Lipid Profile: No results for input(s): CHOL, HDL, LDLCALC, TRIG, CHOLHDL, LDLDIRECT in the last 72 hours. Thyroid Function Tests: No results for input(s): TSH, T4TOTAL, FREET4, T3FREE, THYROIDAB in the last 72 hours. Anemia Panel: No results for input(s): VITAMINB12, FOLATE, FERRITIN, TIBC, IRON, RETICCTPCT in the last 72 hours. Urine analysis:    Component Value Date/Time   COLORURINE YELLOW 02/02/2019 1923   APPEARANCEUR CLEAR 02/02/2019 1923   LABSPEC 1.013 02/02/2019 1923   PHURINE 5.0 02/02/2019 1923   GLUCOSEU NEGATIVE 02/02/2019 1923   GLUCOSEU NEGATIVE 09/06/2017 1200   HGBUR SMALL (A) 02/02/2019 1923   BILIRUBINUR NEGATIVE 02/02/2019 1923   BILIRUBINUR Neg 10/18/2017 1453   KETONESUR NEGATIVE 02/02/2019 1923   PROTEINUR NEGATIVE 02/02/2019 1923   UROBILINOGEN 0.2 10/18/2017 1453   UROBILINOGEN 1.0 09/06/2017 1200   NITRITE POSITIVE (A) 02/02/2019 1923   LEUKOCYTESUR LARGE (A) 02/02/2019 1923   Sepsis Labs: @LABRCNTIP (procalcitonin:4,lacticidven:4)  )No results found for this or any previous visit (from the past 240 hour(s)).    Radiology Studies: No results found.   Scheduled Meds: . amiodarone  200 mg Oral Daily  . chlorhexidine  15 mL Mouth Rinse BID  . chlorhexidine gluconate (MEDLINE KIT)  15 mL Mouth Rinse BID  . Chlorhexidine Gluconate Cloth  6 each Topical Daily  . cholecalciferol  2,000 Units Oral BID  . diltiazem  120 mg Oral Daily  . feeding supplement (ENSURE ENLIVE)  237 mL Oral TID BM  . ferrous sulfate  300 mg Oral BID WC  . gabapentin  100 mg Oral TID  . Gerhardt's butt cream   Topical TID  . mouth rinse  15 mL Mouth Rinse q12n4p  . metoprolol tartrate  50 mg Oral BID  .  multivitamin with minerals  1 tablet Oral Daily  . pantoprazole  40 mg Oral Daily  . pravastatin  20 mg Oral q1800  . senna-docusate  2 tablet Oral BID  . sodium chloride flush  10-40 mL Intracatheter Q12H  . sodium chloride flush  3 mL Intravenous Q12H  . Vitamin D (Ergocalciferol)  50,000 Units Oral Q7 days   Continuous Infusions: . sodium chloride 20 mL/hr at 03/14/19 2315  . sodium chloride Stopped (03/14/19 1958)  . cefTRIAXone (ROCEPHIN)  IV 2 g (03/20/19 2114)     LOS: 26 days   Time Spent in minutes   45 minutes  Woodward Klem D.O. on 03/21/2019 at 3:22 PM  Between 7am to 7pm - Please see pager noted on [email or web address]  After 7pm go to [email or web address]  And look for the night coverage person covering for  me after hours  [facility] Office  [phone]

## 2019-03-21 NOTE — Progress Notes (Signed)
Reviewed RN note. Dressing changed with moderate amount of drainage but no dehiscence. Some bruising swelling in stump. Probably hematoma secondary to anticoags. HGB 7.4  Will review with Dr Sharol Given  Plan for CIR vs SNF

## 2019-03-22 LAB — BASIC METABOLIC PANEL
Anion gap: 8 (ref 5–15)
BUN: 25 mg/dL — ABNORMAL HIGH (ref 8–23)
CO2: 29 mmol/L (ref 22–32)
Calcium: 7.7 mg/dL — ABNORMAL LOW (ref 8.9–10.3)
Chloride: 103 mmol/L (ref 98–111)
Creatinine, Ser: 1.31 mg/dL — ABNORMAL HIGH (ref 0.44–1.00)
GFR calc Af Amer: 45 mL/min — ABNORMAL LOW (ref >60)
GFR calc non Af Amer: 39 mL/min — ABNORMAL LOW (ref >60)
Glucose, Bld: 90 mg/dL (ref 70–99)
Potassium: 4.3 mmol/L (ref 3.5–5.1)
Sodium: 140 mmol/L (ref 135–145)

## 2019-03-22 LAB — HEMOGLOBIN AND HEMATOCRIT, BLOOD
HCT: 23.8 % — ABNORMAL LOW (ref 36.0–46.0)
Hemoglobin: 7.6 g/dL — ABNORMAL LOW (ref 12.0–15.0)

## 2019-03-22 LAB — CBC
HCT: 22.1 % — ABNORMAL LOW (ref 36.0–46.0)
Hemoglobin: 7 g/dL — ABNORMAL LOW (ref 12.0–15.0)
MCH: 30.4 pg (ref 26.0–34.0)
MCHC: 31.7 g/dL (ref 30.0–36.0)
MCV: 96.1 fL (ref 80.0–100.0)
Platelets: 173 10*3/uL (ref 150–400)
RBC: 2.3 MIL/uL — ABNORMAL LOW (ref 3.87–5.11)
RDW: 17.9 % — ABNORMAL HIGH (ref 11.5–15.5)
WBC: 10.8 10*3/uL — ABNORMAL HIGH (ref 4.0–10.5)
nRBC: 1.2 % — ABNORMAL HIGH (ref 0.0–0.2)

## 2019-03-22 LAB — FERRITIN: Ferritin: 853 ng/mL — ABNORMAL HIGH (ref 11–307)

## 2019-03-22 LAB — PROTIME-INR
INR: 1.2 (ref 0.8–1.2)
Prothrombin Time: 14.7 seconds (ref 11.4–15.2)

## 2019-03-22 LAB — IRON AND TIBC
Iron: 39 ug/dL (ref 28–170)
Saturation Ratios: 25 % (ref 10.4–31.8)
TIBC: 154 ug/dL — ABNORMAL LOW (ref 250–450)
UIBC: 115 ug/dL

## 2019-03-22 LAB — PREPARE RBC (CROSSMATCH)

## 2019-03-22 LAB — VITAMIN B12: Vitamin B-12: 462 pg/mL (ref 180–914)

## 2019-03-22 MED ORDER — SODIUM CHLORIDE 0.9% IV SOLUTION: Freq: Once | INTRAVENOUS | Status: DC

## 2019-03-22 NOTE — Progress Notes (Signed)
PROGRESS NOTE  Robin Snow TJQ:[id number] DOB: 1940/12/23 DOA: 02/22/2019 PCP: [name], MD  HPI/Recap of past 24 hours: HPI on 02/22/2019 by Dr. Corena Herter a 78 y.o.femalewith medical history significant ofparoxysmal atrial fibrillationon anticoagulation Coumadin, hypertension, diabetes mellitustype II,andchronic kidney disease stage III. She presents with complaints right knee pain and swelling. Hospitalized from 10/29-11/4 with sepsis related to E. coli bacteremia, UTI, and bacterial arthritis right knee. Treated with Rocephin, then switched to Ancef, and discharged home to on Keflex to complete a 10-day course. Patient had initially done okay when she got home and had completed antibiotics as prescribed. 3 days ago while the daughter was helping her transition she had heard a loud pop in the right knee. Since that time she has had increasing pain and swelling of the right knee to the point in which she started developing fluid-filled blisters on her skin. Associated symptoms include increased warmth, but the patient has not had any fever. She had gone from being unable to bear weight on the right knee and now cannot have anyone touch it without crying out in pain. Denies any significant nausea, vomiting, cough, chest pain, shortness of breath, diarrhea, blood in stools, or dysuria symptoms.  Interim history Patient presented with prosthetic dislocation abscess, status post closed reduction and drainage on 02/22/2019.  Currently being treated for E. coli bacteremia.  Patient underwent right AKA on 03/01/2019 and was intubated after the procedure for respiratory failure.  While in the ICU patient did have positive bacteremia and TEE TEE was concerning for endocarditis.  ID consulted, patient will need antibiotics through 04/12/2019.   Patient had been restarted on heparin and monitored, her hemoglobin was stable for days.  Was then restarted on  Coumadin, at which point her hemoglobin continue to be stable.  However overnight patient's hemoglobin dropped from 8.5 to 6 the morning of 03/20/2019.  Patient to receive 2 units PRBC.  Coumadin and heparin discontinued.  03/22/19: Seen and examined.  Somnolent but easily arousable.  Volume overload on exam with third spacing.  Drop in hemoglobin this morning down to 7.0.  She has antibodies to RBCs, will transfuse 1 unit.  Assessment/Plan: Principal Problem:   Bacteremia due to Escherichia coli Active Problems:   Diabetes mellitus with complication (HCC)   Anemia   Supratherapeutic INR   Sinus pause: 4.02-4.80sec per telemetry 09/08/2016   Sepsis ([place])   Closed dislocation of right knee   Effusion of right knee joint   AF (paroxysmal atrial fibrillation) (HCC)   Iron deficiency anemia due to chronic blood loss   Dislocation of right knee   Osteomyelitis of right knee region [facility])   Severe protein-calorie malnutrition (HCC)   Abscess of thigh   Chronic osteomyelitis of right femur with draining sinus (HCC)   Infection of total right knee replacement (HCC)   Pressure injury of skin   Acute respiratory failure (HCC)  Sepsis secondary to endocarditis, E. coli bacteremia in the setting of infected prosthetic right knee (POA) status post right above-the-knee amputation She had a TEE completed on 03/08/2019 with evidence of moderate mass and thickening of aortic valve suspicious for aortic valve vegetation. ID consulted and recommended 6 weeks of Rocephin with tentative stop date 04/12/2019.  PICC line is in place. -ID did not advise removal of pacemaker at this time Continue to monitor H&H Hemoglobin dropped to 7.0; She has antibodies to RBCs, will transfuse 1 unit.  Acute blood loss anemia/symptomatic anemia post orthopedic  surgery Some bruising and swelling in stump, probably hematoma Maintain hemoglobin greater than 8.0 Management per above Multiple PRBC blood transfusions during this  admission Hemoglobin drop of 5.9 and did receive 2 units of PRBC on 03/14/2019 Hemoglobin down to 6 on 03/20/2019, received 2u PRBC, hemoglobin 7.6 12/15 Hemoglobin down to 7.0 on 03/22/2019, will transfuse 1 unit PRBC Heparin and Coumadin discontinued. Continue to closely monitor H&H  Iron deficiency anemia Continue iron supplement Continue to monitor H&H  GERD Continue Protonix  Paroxysmal A. fib/sick sinus syndrome status post pacemaker placement Rate controlled on diltiazem and metoprolol Rhythm controlled on amiodarone Anticoagulation held due to acute blood loss  Severe protein calorie malnutrition with loss of muscle mass Albumin 1.8 BMI 35 Continue oral supplements  Ambulatory dysfunction/physical debility PT assessed and recommended SNF CSW assisting with placement     DVT Prophylaxis  SCDs  Code Status: Full  Family Communication: None at bedside  Disposition Plan: Admitted.  Disposition to SNF when patient medically stable.  Now with anemia and requiring blood transfusion.  Consultants Orthopedic surgery Infectious disease PCCM  Procedures  Closed reduction right knee prosthetic, drainage Right AKA Intubation/extubation  Antibiotics              Anti-infectives (From admission, onward)     Start     Dose/Rate Route Frequency Ordered Stop   03/02/19 0600  ceFAZolin (ANCEF) IVPB 2g/100 mL premix     2 g 200 mL/hr over 30 Minutes Intravenous On call to O.R. 03/01/19 1304 03/01/19 1526   02/23/19 0345  cefTRIAXone (ROCEPHIN) 2 g in sodium chloride 0.9 % 100 mL IVPB     2 g 200 mL/hr over 30 Minutes Intravenous Daily at bedtime 02/23/19 0336 04/12/19 2359            Objective: Vitals:   03/21/19 1309 03/21/19 1944 03/22/19 0327 03/22/19 0738  BP: 102/60 117/70 100/65 116/70  Pulse: 97 96 94 90  Resp: 17   18  Temp: 98.8 F (37.1 C) 98.9 F (37.2 C) 98.8 F (37.1 C) 98.8 F (37.1 C)  TempSrc: Oral Oral Oral Oral  SpO2:  95% 97% 98% 100%  Weight:      Height:        Intake/Output Summary (Last 24 hours) at 03/22/2019 0913 Last data filed at 03/22/2019 0340 Gross per 24 hour  Intake 20 ml  Output 550 ml  Net -530 ml   Filed Weights   03/14/19 0445 03/16/19 0600 03/20/19 0500  Weight: 99.9 kg 103 kg 102.1 kg    Exam:  . General: 78 y.o. year-old female well developed well nourished in no acute distress.  Somnolent but easily arousable. . Cardiovascular: Regular rate and rhythm with no rubs or gallops.  No thyromegaly or JVD noted.   [name] Respiratory: Clear to auscultation with no wheezes or rales. Good inspiratory effort. . Abdomen: Soft nontender nondistended with normal bowel sounds x4 quadrants. . Musculoskeletal: Trace edema throughout.  Right above-the-knee amputation. [name] Psychiatry: Unable to assess mood due to somnolence.   Data Reviewed: CBC: Recent Labs  Lab 03/18/19 0858 03/19/19 0424 03/20/19 0314 03/20/19 2314 03/21/19 0456 03/22/19 0332  WBC 5.6 5.7 17.6*  --  16.4* 10.8*  HGB 9.0* 8.5* 6.0* 7.4* 7.6* 7.0*  HCT 28.4* 26.4* 19.1* 22.2* 23.2* 22.1*  MCV 95.6 94.3 95.0  --  94.7 96.1  PLT 229 166 208  --  193 301   Basic Metabolic Panel: Recent Labs  Lab 03/17/19 0024 03/20/19  8416 03/21/19 0456 03/22/19 0332  NA 136 139 138 140  K 4.3 4.7 4.5 4.3  CL 101 103 103 103  CO2 _0 GLUCOSE 120* 133* 120* 90  BUN 14 14 25* 25*  CREATININE 0.93 0.95 1.34* 1.31*  CALCIUM 7.7* 8.1* 7.6* 7.7*  MG  --  1.8  --   --    GFR: Estimated Creatinine Clearance: 42.8 mL/min (A) (by C-G formula based on SCr of 1.31 mg/dL (H)). Liver Function Tests: No results for input(s): AST, ALT, ALKPHOS, BILITOT, PROT, ALBUMIN in the last 168 hours. No results for input(s): LIPASE, AMYLASE in the last 168 hours. No results for input(s): AMMONIA in the last 168 hours. Coagulation Profile: Recent Labs  Lab 03/19/19 0424 03/20/19 0314 03/21/19 0456 03/22/19 0332  INR 1.1 1.3* 1.1 1.2     Cardiac Enzymes: No results for input(s): CKTOTAL, CKMB, CKMBINDEX, TROPONINI in the last 168 hours. BNP (last 3 results) No results for input(s): PROBNP in the last 8760 hours. HbA1C: No results for input(s): HGBA1C in the last 72 hours. CBG: Recent Labs  Lab 03/20/19 2100 03/21/19 0055 03/21/19 0447 03/21/19 0755 03/21/19 1156  GLUCAP 122* 137* 112* 115* 104*   Lipid Profile: No results for input(s): CHOL, HDL, LDLCALC, TRIG, CHOLHDL, LDLDIRECT in the last 72 hours. Thyroid Function Tests: No results for input(s): TSH, T4TOTAL, FREET4, T3FREE, THYROIDAB in the last 72 hours. Anemia Panel: No results for input(s): VITAMINB12, FOLATE, FERRITIN, TIBC, IRON, RETICCTPCT in the last 72 hours. Urine analysis:    Component Value Date/Time   COLORURINE YELLOW 02/02/2019 1923   APPEARANCEUR CLEAR 02/02/2019 1923   LABSPEC 1.013 02/02/2019 1923   PHURINE 5.0 02/02/2019 1923   GLUCOSEU NEGATIVE 02/02/2019 1923   GLUCOSEU NEGATIVE 09/06/2017 1200   HGBUR SMALL (A) 02/02/2019 1923   BILIRUBINUR NEGATIVE 02/02/2019 1923   BILIRUBINUR Neg 10/18/2017 1453   [place] 02/02/2019 1923   PROTEINUR NEGATIVE 02/02/2019 1923   UROBILINOGEN 0.2 10/18/2017 1453   UROBILINOGEN 1.0 09/06/2017 1200   NITRITE POSITIVE (A) 02/02/2019 1923   LEUKOCYTESUR LARGE (A) 02/02/2019 1923   Sepsis Labs: _1 (procalcitonin:4,lacticidven:4)  )No results found for this or any previous visit (from the past 240 hour(s)).    Studies: No results found.  Scheduled Meds: . sodium chloride   Intravenous Once  . amiodarone  200 mg Oral Daily  . chlorhexidine  15 mL Mouth Rinse BID  . chlorhexidine gluconate (MEDLINE KIT)  15 mL Mouth Rinse BID  . Chlorhexidine Gluconate Cloth  6 each Topical Daily  . cholecalciferol  2,000 Units Oral BID  . diltiazem  120 mg Oral Daily  . feeding supplement (ENSURE ENLIVE)  237 mL Oral TID BM  . ferrous sulfate  300 mg Oral BID WC  . gabapentin  100 mg  Oral TID  . Gerhardt's butt cream   Topical TID  . mouth rinse  15 mL Mouth Rinse q12n4p  . metoprolol tartrate  50 mg Oral BID  . multivitamin with minerals  1 tablet Oral Daily  . pantoprazole  40 mg Oral Daily  . pravastatin  20 mg Oral q1800  . senna-docusate  2 tablet Oral BID  . sodium chloride flush  10-40 mL Intracatheter Q12H  . sodium chloride flush  3 mL Intravenous Q12H  . Vitamin D (Ergocalciferol)  50,000 Units Oral Q7 days    Continuous Infusions: . sodium chloride 20 mL/hr at 03/14/19 2315  . sodium chloride Stopped (03/14/19 1958)  .  cefTRIAXone (ROCEPHIN)  IV 2 g (03/21/19 2156)     LOS: 27 days     Kayleen Memos, MD [facility] Pager [phone]  If 7PM-7AM, please contact night-coverage [email or web address] Password [facility] 03/22/2019, 9:13 AM

## 2019-03-22 NOTE — Plan of Care (Signed)
  Problem: Skin Integrity: Goal: Risk for impaired skin integrity will decrease Outcome: Progressing   

## 2019-03-22 NOTE — Care Management Important Message (Signed)
Important Message  Patient Details  Name: Robin Snow MRN: [id number] Date of Birth: [birth year]   Medicare Important Message Given:  Yes     Memory Argue 03/22/2019, 1:41 PM

## 2019-03-22 NOTE — Progress Notes (Signed)
Updated the patient's daughter Janett Billow via phone.  All questions answered to her satisfaction.

## 2019-03-22 NOTE — Plan of Care (Signed)
  Problem: Health Behavior/Discharge Planning: Goal: Ability to manage health-related needs will improve Outcome: Progressing   Problem: Clinical Measurements: Goal: Will remain free from infection Outcome: Progressing   Problem: Activity: Goal: Risk for activity intolerance will decrease Outcome: Progressing   Problem: Skin Integrity: Goal: Risk for impaired skin integrity will decrease Outcome: Progressing   

## 2019-03-23 ENCOUNTER — Telehealth: Payer: Self-pay

## 2019-03-23 LAB — FOLATE RBC
Folate, Hemolysate: 363 ng/mL
Folate, RBC: 1720 ng/mL (ref >498)
Hematocrit: 21.1 % — ABNORMAL LOW (ref 34.0–46.6)

## 2019-03-23 LAB — PROTIME-INR
INR: 1.1 (ref 0.8–1.2)
Prothrombin Time: 14.2 seconds (ref 11.4–15.2)

## 2019-03-23 LAB — CBC
HCT: 23.9 % — ABNORMAL LOW (ref 36.0–46.0)
Hemoglobin: 7.5 g/dL — ABNORMAL LOW (ref 12.0–15.0)
MCH: 30.2 pg (ref 26.0–34.0)
MCHC: 31.4 g/dL (ref 30.0–36.0)
MCV: 96.4 fL (ref 80.0–100.0)
Platelets: 168 10*3/uL (ref 150–400)
RBC: 2.48 MIL/uL — ABNORMAL LOW (ref 3.87–5.11)
RDW: 17.7 % — ABNORMAL HIGH (ref 11.5–15.5)
WBC: 7.9 10*3/uL (ref 4.0–10.5)
nRBC: 0.8 % — ABNORMAL HIGH (ref 0.0–0.2)

## 2019-03-23 LAB — HEMOGLOBIN AND HEMATOCRIT, BLOOD
HCT: 28.2 % — ABNORMAL LOW (ref 36.0–46.0)
Hemoglobin: 8.9 g/dL — ABNORMAL LOW (ref 12.0–15.0)

## 2019-03-23 LAB — PREPARE RBC (CROSSMATCH)

## 2019-03-23 MED ORDER — SODIUM CHLORIDE 0.9% IV SOLUTION: Freq: Once | INTRAVENOUS | Status: AC

## 2019-03-23 NOTE — Telephone Encounter (Signed)
Dr. Loma Boston would like to know if someone would be coming by the hospital to see patient today?  CB# [phone].  Please advise.  Thank you.

## 2019-03-23 NOTE — Progress Notes (Signed)
Patients HR jumped to 150 and pacemaker kicked in and went back down to 80. In Afib. No complaints of pain. MD notified. Will continue to monitor.

## 2019-03-23 NOTE — Telephone Encounter (Signed)
Can you please call to advise [name] will see this pt tomorrow?

## 2019-03-23 NOTE — Telephone Encounter (Signed)
Sent message to Doctor that we will see her tomorrow

## 2019-03-23 NOTE — Progress Notes (Signed)
Occupational Therapy Treatment Patient Details Name: Robin Snow MRN: [id number] DOB: 1940/05/29 Today's Date: 03/23/2019    History of present illness 78  y/o female with history of atrial fibrillation, HTN, DM,CKD III had a fall at home when patient was trying to get out of the bed. Hx of R knee pain and swelling for the last few months. Found with sepsis secondary to E. coli bacteremia. Hospitalized from 10/29-11/4 with sepsis related to E. coli bacteremia, UTI, and bacterial arthritis right knee. Found to have dislocated right knee prothestic--02/22/19 s/p Closed reduction of right knee prosthetic dislocation. 11/25 underwent R AKA intubated with respiratory failure extubation 03/09/19   OT comments  Pt. Seen with PT for skilled treatment session.  Focus of session increasing safety and independence with bed mobility and transition to eob in preparation for increasing mobility with transfers.  Pt. Currently requires 2 person assistance for initiation and completion of any movement in bed along with eob sitting.    Follow Up Recommendations  SNF;Supervision/Assistance - 24 hour    Equipment Recommendations  Wheelchair (measurements OT);Wheelchair cushion (measurements OT);Hospital bed;Tub/shower bench;3 in 1 bedside commode    Recommendations for Other Services Rehab consult    Precautions / Restrictions Precautions Precautions: Fall Precaution Comments: R AKA       Mobility Bed Mobility Overal bed mobility: Needs Assistance Bed Mobility: Rolling;Supine to Sit Rolling: Max assist;+2 for physical assistance;+2 for safety/equipment;Mod assist   Supine to sit: Max assist;+2 for physical assistance;HOB elevated Sit to supine: Max assist;+2 for physical assistance   General bed mobility comments: mod assist of 1 to roll to left with hand over hand assist and cues for rail use and looking to target x 4 trials, max +2 to roll right with assist and cues for hand placement, knee  flexion and seqeunce x 2. Pt unable to fully roll to right. Max +2 with HOB elevated to achieve long sitting and pivot to EoB and return to bed. Pt unable to shift weight or scoot in sitting despite repeated attempts and is total +2 to scoot  Transfers                 General transfer comment: unable to atte    Balance Overall balance assessment: Needs assistance Sitting-balance support: Bilateral upper extremity supported;No upper extremity supported;Feet supported                                       ADL either performed or assessed with clinical judgement   ADL               Lower Body Bathing: Total assistance;Bed level;+2 for physical assistance;Cueing for sequencing Lower Body Bathing Details (indicate cue type and reason): total assistance for clean up after bm in bedpan         Toilet Transfer: Total assistance Toilet Transfer Details (indicate cue type and reason): total assistance rolling L/R for bedpan placement and removal, pt. using pure wik the entire time Toileting- Architect and Hygiene: Total assistance;Bed level Toileting - Clothing Manipulation Details (indicate cue type and reason): clean up after BM, and removal and placement of new purewik from rn       General ADL Comments: bed mobility and varying levels of assist with un supported and supported sitting eob     Vision       Perception     Praxis  Cognition Arousal/Alertness: Awake/alert Behavior During Therapy: Flat affect Overall Cognitive Status: Impaired/Different from baseline Area of Impairment: Orientation;Memory;Attention;Following commands;Safety/judgement                 Orientation Level: Disoriented to;Time Current Attention Level: Sustained Memory: Decreased short-term memory Following Commands: Follows one step commands consistently;Follows one step commands with increased time     Problem Solving: Slow processing;Decreased  initiation;Requires verbal cues;Difficulty sequencing;Requires tactile cues General Comments: sequential multimodal cues for mobility with difficulty looking to target        Exercises General Exercises - Lower Extremity Long Arc Quad: AROM;Left;Seated;10 reps Other Exercises Other Exercises: instructed pt. on seated "push ups" with focus of b ue on bed simulating pushing motion used when transferring from sit/stand. pt. completed 5 seated eob, unable to bring buttocks off of the bed but did push and attempt   Shoulder Instructions       General Comments      Pertinent Vitals/ Pain       Pain Assessment: No/denies pain  Home Living                                          Prior Functioning/Environment              Frequency  Min 2X/week        Progress Toward Goals  OT Goals(current goals can now be found in the care plan section)  Progress towards OT goals: Progressing toward goals     Plan Discharge plan remains appropriate    Co-evaluation    PT/OT/SLP Co-Evaluation/Treatment: Yes Reason for Co-Treatment: For patient/therapist safety;To address functional/ADL transfers PT goals addressed during session: Mobility/safety with mobility;Strengthening/ROM OT goals addressed during session: ADL's and self-care;Strengthening/ROM      AM-PAC OT "6 Clicks" Daily Activity     Outcome Measure   Help from another person eating meals?: A Little Help from another person taking care of personal grooming?: A Little Help from another person toileting, which includes using toliet, bedpan, or urinal?: Total Help from another person bathing (including washing, rinsing, drying)?: A Lot Help from another person to put on and taking off regular upper body clothing?: A Lot Help from another person to put on and taking off regular lower body clothing?: Total 6 Click Score: 12    End of Session    OT Visit Diagnosis: Unsteadiness on feet (R26.81);Muscle  weakness (generalized) (M62.81);Pain Pain - Right/Left: Right Pain - part of body: Leg   Activity Tolerance Patient tolerated treatment well   Patient Left in bed;with call bell/phone within reach;with nursing/sitter in room   Nurse Communication Other (comment)(alerted rn of need for new bandage for buttocks (soiled during BM), also ace wrap came off of stump dressing. reviewed need for dressing change)        Time: 7253-6644 OT Time Calculation (min): 37 min  Charges: OT General Charges $OT Visit: 1 Visit OT Treatments $Therapeutic Activity: 8-22 mins   Tanya Nones 03/23/2019, 10:23 AM

## 2019-03-23 NOTE — Progress Notes (Signed)
PROGRESS NOTE  Robin Snow NUU:[id number] DOB: 1940/11/03 DOA: 02/22/2019 PCP: [name], MD  HPI/Recap of past 24 hours: HPI on 02/22/2019 by Dr. Corena Herter a 77 y.o.femalewith medical history significant ofparoxysmal atrial fibrillationon anticoagulation Coumadin, hypertension, diabetes mellitustype II,andchronic kidney disease stage III. She presents with complaints right knee pain and swelling. Hospitalized from 10/29-11/4 with sepsis related to E. coli bacteremia, UTI, and bacterial arthritis right knee. Treated with Rocephin, then switched to Ancef, and discharged home to on Keflex to complete a 10-day course. Patient had initially done okay when she got home and had completed antibiotics as prescribed. 3 days ago while the daughter was helping her transition she had heard a loud pop in the right knee. Since that time she has had increasing pain and swelling of the right knee to the point in which she started developing fluid-filled blisters on her skin. Associated symptoms include increased warmth, but the patient has not had any fever. She had gone from being unable to bear weight on the right knee and now cannot have anyone touch it without crying out in pain. Denies any significant nausea, vomiting, cough, chest pain, shortness of breath, diarrhea, blood in stools, or dysuria symptoms.  Interim history Patient presented with prosthetic dislocation abscess, status post closed reduction and drainage on 02/22/2019.  Currently being treated for E. coli bacteremia.  Patient underwent right AKA on 03/01/2019 and was intubated after the procedure for respiratory failure.  While in the ICU patient did have positive bacteremia and TEE TEE was concerning for endocarditis.  ID consulted, patient will need antibiotics through 04/12/2019.   Patient had been restarted on heparin and monitored, her hemoglobin was stable for days.  Was then restarted on  Coumadin, at which point her hemoglobin continue to be stable.  However overnight patient's hemoglobin dropped from 8.5 to 6 the morning of 03/20/2019.  Patient to receive 2 units PRBC.  Coumadin and heparin discontinued.  03/23/19: Right stump somewhat sore.  Feels somewhat better after transfusion yesterday.  Has no other complaints. Denies chest pain, nausea, vomiting, shortness of breath.  Assessment/Plan: Principal Problem:   Bacteremia due to Escherichia coli Active Problems:   Diabetes mellitus with complication (HCC)   Anemia   Supratherapeutic INR   Sinus pause: 4.02-4.80sec per telemetry 09/08/2016   Sepsis ([place])   Closed dislocation of right knee   Effusion of right knee joint   AF (paroxysmal atrial fibrillation) (HCC)   Iron deficiency anemia due to chronic blood loss   Dislocation of right knee   Osteomyelitis of right knee region [facility])   Severe protein-calorie malnutrition (HCC)   Abscess of thigh   Chronic osteomyelitis of right femur with draining sinus (HCC)   Infection of total right knee replacement (HCC)   Pressure injury of skin   Acute respiratory failure (HCC)  Sepsis secondary to endocarditis, E. coli bacteremia in the setting of infected prosthetic right knee (POA) status post right above-the-knee amputation  She had a TEE completed on 03/08/2019 with evidence of moderate mass and thickening of aortic valve suspicious for aortic valve vegetation.  ID consulted and recommended 6 weeks of Rocephin with tentative stop date 04/12/2019.  PICC line is in place.  -ID did not advise removal of pacemaker at this time  Continue to monitor H&H  Hemoglobin dropped to 7.0; She has antibodies to RBCs, will transfuse 1 unit.  Request orthopedics see patient today  White count normal today  Acute blood  loss anemia/symptomatic anemia post orthopedic surgery  Some bruising and swelling in stump, probably hematoma  Maintain hemoglobin greater than 8.0  Management per  above  Multiple PRBC blood transfusions during this admission  Hemoglobin drop of 5.9 and did receive 2 units of PRBC on 03/14/2019  Hemoglobin down to 6 on 03/20/2019, received 2u PRBC, hemoglobin 7.6 12/15  Hemoglobin down to 7.0 on 03/22/2019, will transfuse 1 unit PRBC  Heparin and Coumadin discontinued.  Hemoglobin 7.5 on 12/17.  Transfuse additional unit   Iron deficiency anemia  Continue iron supplement  Continue to monitor H&H  GERD  Continue Protonix  Paroxysmal A. fib/sick sinus syndrome status post pacemaker placement  Rate controlled on diltiazem and metoprolol  Rhythm controlled on amiodarone  Anticoagulation held due to acute blood loss  Severe protein calorie malnutrition with loss of muscle mass  Albumin 1.8  BMI 35  Continue oral supplements  Ambulatory dysfunction/physical debility PT assessed and recommended SNF CSW assisting with placement     DVT Prophylaxis  SCDs  Code Status: Full  Family Communication: None at bedside  Disposition Plan: Admitted.  Disposition to SNF when patient medically stable.  Now with anemia and requiring blood transfusion.  Consultants Orthopedic surgery Infectious disease PCCM  Procedures  Closed reduction right knee prosthetic, drainage Right AKA Intubation/extubation  Antibiotics              Anti-infectives (From admission, onward)     Start     Dose/Rate Route Frequency Ordered Stop   03/02/19 0600  ceFAZolin (ANCEF) IVPB 2g/100 mL premix     2 g 200 mL/hr over 30 Minutes Intravenous On call to O.R. 03/01/19 1304 03/01/19 1526   02/23/19 0345  cefTRIAXone (ROCEPHIN) 2 g in sodium chloride 0.9 % 100 mL IVPB     2 g 200 mL/hr over 30 Minutes Intravenous Daily at bedtime 02/23/19 0336 04/12/19 2359            Objective: Vitals:   03/22/19 2316 03/23/19 0422 03/23/19 0500 03/23/19 0850  BP: 109/64 (!) 112/57  102/61  Pulse: 94 (!) 59 79 62  Resp:  17  17  Temp:  99.8  F (37.7 C)  (!) 97.5 F (36.4 C)  TempSrc:  Oral  Oral  SpO2: 100% 97%  100%  Weight:      Height:        Intake/Output Summary (Last 24 hours) at 03/23/2019 1015 Last data filed at 03/23/2019 0422 Gross per 24 hour  Intake 10 ml  Output 100 ml  Net -90 ml   Filed Weights   03/14/19 0445 03/16/19 0600 03/20/19 0500  Weight: 99.9 kg 103 kg 102.1 kg    Exam:  . General: 78 y.o. year-old female well developed well nourished in no acute distress.  Somnolent but easily arousable. . Cardiovascular: Regular rates.  No murmurs.   [name] Respiratory: Clear to auscultation bilaterally with no wheezes, rales, rhonchi.  Good respiratory effort . Abdomen: Obese.  Nontender, nondistended. . Musculoskeletal: Extremities edematous.  Right AKA.  The surgical wound has extensive bruising surrounding it.  Staples are still in place.  Bloody oozing from wound.  There is some odor to the wound. [name] Psychiatry: Unable to assess mood due to somnolence.   Data Reviewed: CBC: Recent Labs  Lab 03/19/19 0424 03/20/19 0314 03/20/19 2314 03/21/19 0456 03/22/19 0332 03/22/19 2016 03/23/19 0356  WBC 5.7 17.6*  --  16.4* 10.8*  --  7.9  HGB 8.5* 6.0* 7.4* 7.6*  7.0* 7.6* 7.5*  HCT 26.4* 19.1* 22.2* 23.2* 22.1* 23.8* 23.9*  MCV 94.3 95.0  --  94.7 96.1  --  96.4  PLT 166 208  --  193 173  --  175   Basic Metabolic Panel: Recent Labs  Lab 03/17/19 0024 03/20/19 0314 03/21/19 0456 03/22/19 0332  NA 136 139 138 140  K 4.3 4.7 4.5 4.3  CL 101 103 103 103  CO2 '27 26 27 29  '$ GLUCOSE 120* 133* 120* 90  BUN 14 14 25* 25*  CREATININE 0.93 0.95 1.34* 1.31*  CALCIUM 7.7* 8.1* 7.6* 7.7*  MG  --  1.8  --   --    GFR: Estimated Creatinine Clearance: 42.8 mL/min (A) (by C-G formula based on SCr of 1.31 mg/dL (H)). Liver Function Tests: No results for input(s): AST, ALT, ALKPHOS, BILITOT, PROT, ALBUMIN in the last 168 hours. No results for input(s): LIPASE, AMYLASE in the last 168 hours. No results for  input(s): AMMONIA in the last 168 hours. Coagulation Profile: Recent Labs  Lab 03/19/19 0424 03/20/19 0314 03/21/19 0456 03/22/19 0332 03/23/19 0356  INR 1.1 1.3* 1.1 1.2 1.1   Cardiac Enzymes: No results for input(s): CKTOTAL, CKMB, CKMBINDEX, TROPONINI in the last 168 hours. BNP (last 3 results) No results for input(s): PROBNP in the last 8760 hours. HbA1C: No results for input(s): HGBA1C in the last 72 hours. CBG: Recent Labs  Lab 03/20/19 2100 03/21/19 0055 03/21/19 0447 03/21/19 0755 03/21/19 1156  GLUCAP 122* 137* 112* 115* 104*   Lipid Profile: No results for input(s): CHOL, HDL, LDLCALC, TRIG, CHOLHDL, LDLDIRECT in the last 72 hours. Thyroid Function Tests: No results for input(s): TSH, T4TOTAL, FREET4, T3FREE, THYROIDAB in the last 72 hours. Anemia Panel: Recent Labs    03/22/19 1110  VITAMINB12 462  FERRITIN 853*  TIBC 154*  IRON 39   Urine analysis:    Component Value Date/Time   COLORURINE YELLOW 02/02/2019 1923   APPEARANCEUR CLEAR 02/02/2019 1923   LABSPEC 1.013 02/02/2019 1923   PHURINE 5.0 02/02/2019 1923   GLUCOSEU NEGATIVE 02/02/2019 1923   GLUCOSEU NEGATIVE 09/06/2017 1200   HGBUR SMALL (A) 02/02/2019 1923   BILIRUBINUR NEGATIVE 02/02/2019 1923   BILIRUBINUR Neg 10/18/2017 1453   [place] 02/02/2019 1923   PROTEINUR NEGATIVE 02/02/2019 1923   UROBILINOGEN 0.2 10/18/2017 1453   UROBILINOGEN 1.0 09/06/2017 1200   NITRITE POSITIVE (A) 02/02/2019 1923   LEUKOCYTESUR LARGE (A) 02/02/2019 1923   Sepsis Labs: '@LABRCNTIP'$ (procalcitonin:4,lacticidven:4)  )No results found for this or any previous visit (from the past 240 hour(s)).    Studies: No results found.  Scheduled Meds: . sodium chloride   Intravenous Once  . sodium chloride   Intravenous Once  . amiodarone  200 mg Oral Daily  . chlorhexidine  15 mL Mouth Rinse BID  . chlorhexidine gluconate (MEDLINE KIT)  15 mL Mouth Rinse BID  . Chlorhexidine Gluconate Cloth  6 each  Topical Daily  . cholecalciferol  2,000 Units Oral BID  . diltiazem  120 mg Oral Daily  . feeding supplement (ENSURE ENLIVE)  237 mL Oral TID BM  . ferrous sulfate  300 mg Oral BID WC  . gabapentin  100 mg Oral TID  . Gerhardt's butt cream   Topical TID  . mouth rinse  15 mL Mouth Rinse q12n4p  . metoprolol tartrate  50 mg Oral BID  . multivitamin with minerals  1 tablet Oral Daily  . pantoprazole  40 mg Oral Daily  .  pravastatin  20 mg Oral q1800  . senna-docusate  2 tablet Oral BID  . sodium chloride flush  10-40 mL Intracatheter Q12H  . sodium chloride flush  3 mL Intravenous Q12H  . Vitamin D (Ergocalciferol)  50,000 Units Oral Q7 days    Continuous Infusions: . sodium chloride 20 mL/hr at 03/14/19 2315  . sodium chloride Stopped (03/14/19 1958)  . cefTRIAXone (ROCEPHIN)  IV 2 g (03/22/19 2314)     LOS: 28 days     Truett Mainland, DO [facility]  03/23/2019, 10:15 AM

## 2019-03-23 NOTE — Progress Notes (Signed)
Physical Therapy Treatment Patient Details Name: Robin Snow MRN: [id number] DOB: 07-24-1940 Today's Date: 03/23/2019    History of Present Illness 78  y/o female with history of atrial fibrillation, HTN, DM,CKD III had a fall at home when patient was trying to get out of the bed. Hx of R knee pain and swelling for the last few months. Found with sepsis secondary to E. coli bacteremia. Hospitalized from 10/29-11/4 with sepsis related to E. coli bacteremia, UTI, and bacterial arthritis right knee. Found to have dislocated right knee prothestic--02/22/19 s/p Closed reduction of right knee prosthetic dislocation. 11/25 underwent R AKA intubated with respiratory failure extubation 03/09/19    PT Comments    Pt supine on arrival with willingness to participate in therapy. PT able to roll to don sacral foam by RN but then had to use bedpan for BM with need for additional foam. Pt slow to process cues and needs sequential cueing to roll with increased difficulty and assist rolling to right. Pt with improved rolling with repetition. Pt with significant upper body weakness in addition to body habitus making scooting and weight shifting in sitting unachievable without max +2 assist. At this time focusing on strengthening and bed mobility is a greater focus with need for lift for OOB.  Pt with dressing sliding off of AKA end of session with notable drainage, erythema and foul odor with RN aware and present to redress.    Follow Up Recommendations  SNF;Supervision/Assistance - 24 hour     Equipment Recommendations  Wheelchair (measurements PT);Wheelchair cushion (measurements PT);Hospital bed    Recommendations for Other Services       Precautions / Restrictions Precautions Precautions: Fall Precaution Comments: R AKA    Mobility  Bed Mobility Overal bed mobility: Needs Assistance Bed Mobility: Rolling;Supine to Sit Rolling: Max assist;+2 for physical assistance;+2 for safety/equipment;Mod  assist   Supine to sit: Max assist;+2 for physical assistance;HOB elevated Sit to supine: Max assist;+2 for physical assistance   General bed mobility comments: mod assist of 1 to roll to left with hand over hand assist and cues for rail use and looking to target x 4 trials, max +2 to roll right with assist and cues for hand placement, knee flexion and seqeunce x 2. Pt unable to fully roll to right. Max +2 with HOB elevated to achieve long sitting and pivot to EoB and return to bed. Pt unable to shift weight or scoot in sitting despite repeated attempts and is total +2 to scoot  Transfers                 General transfer comment: unable to attempt  Ambulation/Gait                 Stairs             Wheelchair Mobility    Modified Rankin (Stroke Patients Only)       Balance                                            Cognition Arousal/Alertness: Awake/alert Behavior During Therapy: Flat affect Overall Cognitive Status: Impaired/Different from baseline Area of Impairment: Orientation;Memory;Attention;Following commands;Safety/judgement                 Orientation Level: Disoriented to;Time Current Attention Level: Sustained Memory: Decreased short-term memory Following Commands: Follows one step commands consistently;Follows one step  commands with increased time     Problem Solving: Slow processing;Decreased initiation;Requires verbal cues;Difficulty sequencing;Requires tactile cues General Comments: sequential multimodal cues for mobility with difficulty looking to target      Exercises General Exercises - Lower Extremity Long Arc Quad: AROM;Left;Seated;10 reps    General Comments        Pertinent Vitals/Pain Pain Assessment: No/denies pain    Home Living                      Prior Function            PT Goals (current goals can now be found in the care plan section) Acute Rehab PT Goals Time For Goal  Achievement: 04/06/19 Potential to Achieve Goals: Fair Additional Goals Additional Goal #1: pt will propel WC 50' with min assist Progress towards PT goals: Goals downgraded-see care plan;Not progressing toward goals - comment(very limited)    Frequency    Min 2X/week      PT Plan Current plan remains appropriate;Frequency needs to be updated    Co-evaluation PT/OT/SLP Co-Evaluation/Treatment: Yes Reason for Co-Treatment: Complexity of the patient's impairments (multi-system involvement);For patient/therapist safety PT goals addressed during session: Mobility/safety with mobility;Strengthening/ROM        AM-PAC PT "6 Clicks" Mobility   Outcome Measure  Help needed turning from your back to your side while in a flat bed without using bedrails?: Total Help needed moving from lying on your back to sitting on the side of a flat bed without using bedrails?: Total Help needed moving to and from a bed to a chair (including a wheelchair)?: Total Help needed standing up from a chair using your arms (e.g., wheelchair or bedside chair)?: Total Help needed to walk in hospital room?: Total Help needed climbing 3-5 steps with a railing? : Total 6 Click Score: 6    End of Session     Patient left: in bed;with call bell/phone within reach;with bed alarm set Nurse Communication: Mobility status PT Visit Diagnosis: Difficulty in walking, not elsewhere classified (R26.2);History of falling (Z91.81);Muscle weakness (generalized) (M62.81)     Time:  4782- 0947    Charges:    1 therapeutic activity                    Merryl Hacker, PT Acute Rehabilitation Services Pager: [phone] Office: [phone]    [name] 03/23/2019, 9:42 AM

## 2019-03-24 DIAGNOSIS — I455 Other specified heart block: Secondary | ICD-10-CM

## 2019-03-24 LAB — CBC
HCT: 26.7 % — ABNORMAL LOW (ref 36.0–46.0)
Hemoglobin: 8.5 g/dL — ABNORMAL LOW (ref 12.0–15.0)
MCH: 30.5 pg (ref 26.0–34.0)
MCHC: 31.8 g/dL (ref 30.0–36.0)
MCV: 95.7 fL (ref 80.0–100.0)
Platelets: 165 10*3/uL (ref 150–400)
RBC: 2.79 MIL/uL — ABNORMAL LOW (ref 3.87–5.11)
RDW: 17.5 % — ABNORMAL HIGH (ref 11.5–15.5)
WBC: 7.5 10*3/uL (ref 4.0–10.5)
nRBC: 0.3 % — ABNORMAL HIGH (ref 0.0–0.2)

## 2019-03-24 LAB — BASIC METABOLIC PANEL
Anion gap: 9 (ref 5–15)
BUN: 21 mg/dL (ref 8–23)
CO2: 27 mmol/L (ref 22–32)
Calcium: 8.1 mg/dL — ABNORMAL LOW (ref 8.9–10.3)
Chloride: 105 mmol/L (ref 98–111)
Creatinine, Ser: 1.05 mg/dL — ABNORMAL HIGH (ref 0.44–1.00)
GFR calc Af Amer: 58 mL/min — ABNORMAL LOW (ref >60)
GFR calc non Af Amer: 50 mL/min — ABNORMAL LOW (ref >60)
Glucose, Bld: 101 mg/dL — ABNORMAL HIGH (ref 70–99)
Potassium: 3.9 mmol/L (ref 3.5–5.1)
Sodium: 141 mmol/L (ref 135–145)

## 2019-03-24 LAB — PROTIME-INR
INR: 1.1 (ref 0.8–1.2)
Prothrombin Time: 14.1 seconds (ref 11.4–15.2)

## 2019-03-24 NOTE — Plan of Care (Addendum)
Incision to R thigh has extremely foul odor.   Problem: Pain Managment: Goal: General experience of comfort will improve Outcome: Progressing

## 2019-03-24 NOTE — Plan of Care (Signed)
  Problem: Clinical Measurements: Goal: Will remain free from infection Outcome: Progressing   Problem: Activity: Goal: Risk for activity intolerance will decrease Outcome: Progressing   Problem: Pain Managment: Goal: General experience of comfort will improve Outcome: Progressing   Problem: Skin Integrity: Goal: Risk for impaired skin integrity will decrease Outcome: Progressing   

## 2019-03-24 NOTE — Progress Notes (Signed)
PROGRESS NOTE  Robin Snow:[id number] DOB: Sep 05, 1940 DOA: 02/22/2019 PCP: [name], MD  HPI/Recap of past 24 hours: HPI on 02/22/2019 by Dr. Corena Herter a 78 y.o.femalewith medical history significant ofparoxysmal atrial fibrillationon anticoagulation Coumadin, hypertension, diabetes mellitustype II,andchronic kidney disease stage III. She presents with complaints right knee pain and swelling. Hospitalized from 10/29-11/4 with sepsis related to E. coli bacteremia, UTI, and bacterial arthritis right knee. Treated with Rocephin, then switched to Ancef, and discharged home to on Keflex to complete a 10-day course. Patient had initially done okay when she got home and had completed antibiotics as prescribed. 3 days ago while the daughter was helping her transition she had heard a loud pop in the right knee. Since that time she has had increasing pain and swelling of the right knee to the point in which she started developing fluid-filled blisters on her skin. Associated symptoms include increased warmth, but the patient has not had any fever. She had gone from being unable to bear weight on the right knee and now cannot have anyone touch it without crying out in pain. Denies any significant nausea, vomiting, cough, chest pain, shortness of breath, diarrhea, blood in stools, or dysuria symptoms.  Interim history Patient presented with prosthetic dislocation abscess, status post closed reduction and drainage on 02/22/2019.  Currently being treated for E. coli bacteremia.  Patient underwent right AKA on 03/01/2019 and was intubated after the procedure for respiratory failure.  While in the ICU patient did have positive bacteremia and TEE TEE was concerning for endocarditis.  ID consulted, patient will need antibiotics through 04/12/2019.   Patient had been restarted on heparin and monitored, her hemoglobin was stable for days.  Was then restarted on  Coumadin, at which point her hemoglobin continue to be stable.  However overnight patient's hemoglobin dropped from 8.5 to 6 the morning of 03/20/2019.  Patient to receive 2 units PRBC.  Coumadin and heparin discontinued.  03/24/19: Still having drainage from right stump.  Continues to be somewhat sore.  Improved after transfusion yesterday.  Denies chest pain, nausea, vomiting, shortness of breath.  Has been consuming the Ensure drinks between meals.  Nurse reports that blood pressure was a little soft today  Assessment/Plan: Principal Problem:   Bacteremia due to Escherichia coli Active Problems:   Diabetes mellitus with complication (HCC)   Anemia   Supratherapeutic INR   Sinus pause: 4.02-4.80sec per telemetry 09/08/2016   Sepsis ([place])   Closed dislocation of right knee   Effusion of right knee joint   AF (paroxysmal atrial fibrillation) (HCC)   Iron deficiency anemia due to chronic blood loss   Dislocation of right knee   Osteomyelitis of right knee region [facility])   Severe protein-calorie malnutrition (HCC)   Abscess of thigh   Chronic osteomyelitis of right femur with draining sinus (HCC)   Infection of total right knee replacement (HCC)   Pressure injury of skin   Acute respiratory failure (HCC)  Sepsis secondary to endocarditis, E. coli bacteremia in the setting of infected prosthetic right knee (POA) status post right above-the-knee amputation  She had a TEE completed on 03/08/2019 with evidence of moderate mass and thickening of aortic valve suspicious for aortic valve vegetation.  ID consulted and recommended 6 weeks of Rocephin with tentative stop date 04/12/2019.  PICC line is in place.  -ID did not advise removal of pacemaker at this time  Continue to monitor H&H  Hemoglobin dropped to 7.0; She  has antibodies to RBCs, will transfuse 1 unit.  Having a syndrome wound necrosis, however because her protein is low, she will likely continue to have poor wound healing.  We will  attempt to maximize nutrition with the ensures and increase protein intake.  Acute blood loss anemia/symptomatic anemia post orthopedic surgery  Some bruising and swelling in stump, probably hematoma  Maintain hemoglobin greater than 8.0  Management per above  Multiple PRBC blood transfusions during this admission  Hemoglobin drop of 5.9 and did receive 2 units of PRBC on 03/14/2019  Hemoglobin down to 6 on 03/20/2019, received 2u PRBC, hemoglobin 7.6 12/15  Hemoglobin down to 7.0 on 03/22/2019, will transfuse 1 unit PRBC  Heparin and Coumadin discontinued.  Hemoglobin 7.5 on 12/17.  Transfuse additional unit  Hemoglobin 8.5 today.   Iron deficiency anemia  Continue iron supplement  Continue to monitor H&H  GERD  Continue Protonix  Paroxysmal A. fib/sick sinus syndrome status post pacemaker placement  Rate controlled on diltiazem and metoprolol  Rhythm controlled on amiodarone  Anticoagulation held due to acute blood loss  Severe protein calorie malnutrition with loss of muscle mass  Albumin 1.8  BMI 35  Continue oral supplements  Ambulatory dysfunction/physical debility PT assessed and recommended SNF CSW assisting with placement     DVT Prophylaxis  SCDs  Code Status: Full  Family Communication: None at bedside  Disposition Plan: Admitted.  Disposition to SNF when patient medically stable.  Now with anemia and requiring blood transfusion.  Consultants Orthopedic surgery Infectious disease PCCM  Procedures  Closed reduction right knee prosthetic, drainage Right AKA Intubation/extubation  Antibiotics              Anti-infectives (From admission, onward)     Start     Dose/Rate Route Frequency Ordered Stop   03/02/19 0600  ceFAZolin (ANCEF) IVPB 2g/100 mL premix     2 g 200 mL/hr over 30 Minutes Intravenous On call to O.R. 03/01/19 1304 03/01/19 1526   02/23/19 0345  cefTRIAXone (ROCEPHIN) 2 g in sodium chloride 0.9 % 100 mL  IVPB     2 g 200 mL/hr over 30 Minutes Intravenous Daily at bedtime 02/23/19 0336 04/12/19 2359            Objective: Vitals:   03/24/19 0500 03/24/19 0847 03/24/19 1055 03/24/19 1056  BP:  92/69 106/63   Pulse:  62 60 61  Resp:  18 15   Temp:  98.7 F (37.1 C) 98.3 F (36.8 C)   TempSrc:  Oral Oral   SpO2:  93% 96% 96%  Weight: 101.8 kg     Height:        Intake/Output Summary (Last 24 hours) at 03/24/2019 1211 Last data filed at 03/24/2019 1113 Gross per 24 hour  Intake 1853.6 ml  Output 100 ml  Net 1753.6 ml   Filed Weights   03/16/19 0600 03/20/19 0500 03/24/19 0500  Weight: 103 kg 102.1 kg 101.8 kg    Exam:  . General: 78 y.o. year-old female well developed well nourished in no acute distress.  Somnolent but easily arousable. . Cardiovascular: Regular rates.  No murmurs.   [name] Respiratory: Clear to auscultation bilaterally with no wheezes, rales, rhonchi.  Good respiratory effort . Abdomen: Obese.  Nontender, nondistended. . Musculoskeletal: Extremities edematous.  Right AKA.  The surgical wound has extensive bruising surrounding it.  Staples are still in place.  Bloody oozing from wound.  There is some odor to the wound. [name] Psychiatry: Karen Kays  to assess mood due to somnolence.   Data Reviewed: CBC: Recent Labs  Lab 03/20/19 0314 03/21/19 0456 03/22/19 0332 03/22/19 0619 03/22/19 2016 03/23/19 0356 03/23/19 1632 03/24/19 0350  WBC 17.6* 16.4* 10.8*  --   --  7.9  --  7.5  HGB 6.0* 7.6* 7.0*  --  7.6* 7.5* 8.9* 8.5*  HCT 19.1* 23.2* 22.1* 21.1* 23.8* 23.9* 28.2* 26.7*  MCV 95.0 94.7 96.1  --   --  96.4  --  95.7  PLT 208 193 173  --   --  168  --  671   Basic Metabolic Panel: Recent Labs  Lab 03/20/19 0314 03/21/19 0456 03/22/19 0332 03/24/19 0350  NA 139 138 140 141  K 4.7 4.5 4.3 3.9  CL 103 103 103 105  CO2 '26 27 29 27  '$ GLUCOSE 133* 120* 90 101*  BUN 14 25* 25* 21  CREATININE 0.95 1.34* 1.31* 1.05*  CALCIUM 8.1* 7.6* 7.7* 8.1*  MG  1.8  --   --   --    GFR: Estimated Creatinine Clearance: 53.3 mL/min (A) (by C-G formula based on SCr of 1.05 mg/dL (H)). Liver Function Tests: No results for input(s): AST, ALT, ALKPHOS, BILITOT, PROT, ALBUMIN in the last 168 hours. No results for input(s): LIPASE, AMYLASE in the last 168 hours. No results for input(s): AMMONIA in the last 168 hours. Coagulation Profile: Recent Labs  Lab 03/20/19 0314 03/21/19 0456 03/22/19 0332 03/23/19 0356 03/24/19 0350  INR 1.3* 1.1 1.2 1.1 1.1   Cardiac Enzymes: No results for input(s): CKTOTAL, CKMB, CKMBINDEX, TROPONINI in the last 168 hours. BNP (last 3 results) No results for input(s): PROBNP in the last 8760 hours. HbA1C: No results for input(s): HGBA1C in the last 72 hours. CBG: Recent Labs  Lab 03/20/19 2100 03/21/19 0055 03/21/19 0447 03/21/19 0755 03/21/19 1156  GLUCAP 122* 137* 112* 115* 104*   Lipid Profile: No results for input(s): CHOL, HDL, LDLCALC, TRIG, CHOLHDL, LDLDIRECT in the last 72 hours. Thyroid Function Tests: No results for input(s): TSH, T4TOTAL, FREET4, T3FREE, THYROIDAB in the last 72 hours. Anemia Panel: Recent Labs    03/22/19 1110  VITAMINB12 462  FERRITIN 853*  TIBC 154*  IRON 39   Urine analysis:    Component Value Date/Time   COLORURINE YELLOW 02/02/2019 1923   APPEARANCEUR CLEAR 02/02/2019 1923   LABSPEC 1.013 02/02/2019 1923   PHURINE 5.0 02/02/2019 1923   GLUCOSEU NEGATIVE 02/02/2019 1923   GLUCOSEU NEGATIVE 09/06/2017 1200   HGBUR SMALL (A) 02/02/2019 1923   BILIRUBINUR NEGATIVE 02/02/2019 1923   BILIRUBINUR Neg 10/18/2017 1453   [place] 02/02/2019 1923   PROTEINUR NEGATIVE 02/02/2019 1923   UROBILINOGEN 0.2 10/18/2017 1453   UROBILINOGEN 1.0 09/06/2017 1200   NITRITE POSITIVE (A) 02/02/2019 1923   LEUKOCYTESUR LARGE (A) 02/02/2019 1923   Sepsis Labs: '@LABRCNTIP'$ (procalcitonin:4,lacticidven:4)  )No results found for this or any previous visit (from the past 240  hour(s)).    Studies: No results found.  Scheduled Meds: . sodium chloride   Intravenous Once  . amiodarone  200 mg Oral Daily  . chlorhexidine  15 mL Mouth Rinse BID  . chlorhexidine gluconate (MEDLINE KIT)  15 mL Mouth Rinse BID  . Chlorhexidine Gluconate Cloth  6 each Topical Daily  . cholecalciferol  2,000 Units Oral BID  . diltiazem  120 mg Oral Daily  . feeding supplement (ENSURE ENLIVE)  237 mL Oral TID BM  . ferrous sulfate  300 mg Oral BID WC  .  gabapentin  100 mg Oral TID  . Gerhardt's butt cream   Topical TID  . mouth rinse  15 mL Mouth Rinse q12n4p  . metoprolol tartrate  50 mg Oral BID  . multivitamin with minerals  1 tablet Oral Daily  . pantoprazole  40 mg Oral Daily  . pravastatin  20 mg Oral q1800  . senna-docusate  2 tablet Oral BID  . sodium chloride flush  10-40 mL Intracatheter Q12H  . sodium chloride flush  3 mL Intravenous Q12H  . Vitamin D (Ergocalciferol)  50,000 Units Oral Q7 days    Continuous Infusions: . sodium chloride 20 mL/hr at 03/24/19 1113  . sodium chloride Stopped (03/14/19 1958)  . cefTRIAXone (ROCEPHIN)  IV Stopped (03/23/19 2205)     LOS: 29 days     Truett Mainland, DO [facility]  03/24/2019, 12:11 PM

## 2019-03-24 NOTE — Consult Note (Signed)
Robin Snow   03/24/2019  Robin Snow 1940-04-18 [id number]    Patient reviewed for long length of stay (LLOS) with 26% high risk score for unplanned readmission, with 2 hospitalizations in the past 6 months and a 30 day readmission; as well as to check for possible needs of [facility] [employer] care management services under her [employer] insurance benefit.  Patient had previous outreach from [facility] but without success to establish contact.   Chart reviewed and MD notes reveal as follows: [name] a 78 y.o.femalewith medical history significant ofparoxysmal atrial fibrillationon anticoagulation Coumadin, hypertension, diabetes mellitustype II, and chronic kidney disease stage III,    presented with complaints right knee pain and swelling.    Has history of fall at home when patient was trying to get out of the bed. Have had R knee pain and swelling for the last few months. Hospitalized from 10/29-11/4 with sepsis related to E. coli bacteremia, UTI, and bacterial arthritis right knee. Was found to have dislocated right knee prosthetic dislocation abscess, status post closed reduction and drainage on 02/22/2019. Currently being treated for E. coli bacteremia.     Patient underwent right AKA (above the knee amputation) on 03/01/2019 and was intubated after the procedure for respiratory failure; extubation 03/09/19. Patient did have positive bacteremia and TEE was concerning for endocarditis. ID (Infectious Disease) consulted, patient will need antibiotics through 04/12/2019.   Her primary care provider isCrawford, Austin Miles, MD, with Roma Schanz, listed to provide transition of care follow-up.  Review of patient's medical record reveals that patient is currently being recommended for skilled nursing facility disposition.   Plan:Follow for disposition and if transitions to SNF, no [facility] Care Management will be followed.   For  questions and additional information, please call:  Asier Desroches A. Taraya Steward, BSN, RN-BC [facility] Liaison Cell: [phone]

## 2019-03-24 NOTE — Progress Notes (Signed)
Patient ID: Robin Snow, female   DOB: [birth year], 78 y.o.   MRN: [id number] Patient is status post right above-the-knee amputation.  Examination of the wound there is wrinkling of the skin around the incision with no clinical signs of a deep hematoma.  The wound edges are ischemic with gangrenous changes approximately a centimeter wide along the surgical incision there is clear serosanguineous drainage.  The wound edges are still approximated.  Patient has an albumin of 1.8, with her current nutrition status her ability to heal this incision is extremely limited.  I discussed with the patient the importance of protein intake avoiding carbohydrates and starch.  Recommend continue dry dressing change.  I did not think that a revision of the surgical incision would make a difference in the wound healing.

## 2019-03-25 DIAGNOSIS — J9601 Acute respiratory failure with hypoxia: Secondary | ICD-10-CM

## 2019-03-25 LAB — COMPREHENSIVE METABOLIC PANEL
ALT: 25 U/L (ref 0–44)
AST: 29 U/L (ref 15–41)
Albumin: 1.9 g/dL — ABNORMAL LOW (ref 3.5–5.0)
Alkaline Phosphatase: 92 U/L (ref 38–126)
Anion gap: 9 (ref 5–15)
BUN: 18 mg/dL (ref 8–23)
CO2: 26 mmol/L (ref 22–32)
Calcium: 8 mg/dL — ABNORMAL LOW (ref 8.9–10.3)
Chloride: 105 mmol/L (ref 98–111)
Creatinine, Ser: 1 mg/dL (ref 0.44–1.00)
GFR calc Af Amer: >60 mL/min (ref >60)
GFR calc non Af Amer: 54 mL/min — ABNORMAL LOW (ref >60)
Glucose, Bld: 97 mg/dL (ref 70–99)
Potassium: 3.9 mmol/L (ref 3.5–5.1)
Sodium: 140 mmol/L (ref 135–145)
Total Bilirubin: 0.8 mg/dL (ref 0.3–1.2)
Total Protein: 5.3 g/dL — ABNORMAL LOW (ref 6.5–8.1)

## 2019-03-25 LAB — CBC
HCT: 27.1 % — ABNORMAL LOW (ref 36.0–46.0)
Hemoglobin: 8.4 g/dL — ABNORMAL LOW (ref 12.0–15.0)
MCH: 30 pg (ref 26.0–34.0)
MCHC: 31 g/dL (ref 30.0–36.0)
MCV: 96.8 fL (ref 80.0–100.0)
Platelets: 171 10*3/uL (ref 150–400)
RBC: 2.8 MIL/uL — ABNORMAL LOW (ref 3.87–5.11)
RDW: 17.1 % — ABNORMAL HIGH (ref 11.5–15.5)
WBC: 6.5 10*3/uL (ref 4.0–10.5)
nRBC: 0 % (ref 0.0–0.2)

## 2019-03-25 LAB — PROTIME-INR
INR: 1.2 (ref 0.8–1.2)
Prothrombin Time: 14.6 seconds (ref 11.4–15.2)

## 2019-03-25 MED ORDER — SODIUM CHLORIDE 0.9 % IV SOLN
510.0000 mg | Freq: Once | INTRAVENOUS | Status: AC
  Administered 2019-03-25: 510 mg via INTRAVENOUS
  Filled 2019-03-25: qty 17

## 2019-03-25 NOTE — Progress Notes (Signed)
PROGRESS NOTE    Robin Snow   OFB:[id number]  DOB: [birth year]  DOA: 02/22/2019 PCP: [name], MD   Brief Narrative:  Robin Snow: Robin Snow a 78 y.o.femalewith medical history significant ofparoxysmal atrial fibrillationon anticoagulation Coumadin, hypertension, diabetes mellitustype II,andchronic kidney disease stage III. She presents with complaints right knee pain and swelling. Hospitalized from 10/29-11/4 with sepsis related to E. coli bacteremia, UTI, and bacterial arthritis right knee. Treated with Rocephin, then switched to Ancef, and discharged home to on Keflex to complete a 10-day course. Patient had initially done okay when she got home and had completed antibiotics as prescribed. 3 days ago while the daughter was helping her transition she had heard a loud pop in the right knee. Since that time she has had increasing pain and swelling of the right knee to the point in which she started developing fluid-filled blisters on her skin. Associated symptoms include increased warmth, but the patient has not had any fever. She had gone from being unable to bear weight on the right knee and now cannot have anyone touch it without crying out in pain.   Subjective: She has no complaints.   Hospital Course:   Sepsis secondary to endocarditis, E. coli bacteremia with infected prosthetic right knee-status post right above-the-knee amputation -03/08/2019 TEE revealed aortic vegetation-pacer wires did not have any vegetations -ID is recommended 6 weeks of ceftriaxone with a stop date of 1/6 -Per ID, it is reasonable to leave the pacemaker and at this time as no vegetations were noted on TEE  Right AKA with wound dehiscence and bleeding-acute blood loss anemia requiring blood transfusions Iron deficiency anemia -The patient was started on heparin after surgery and was noted to have significant amount of acute blood loss into the stump and  required 6 units of packed red blood cells between 12/8 and 12/17 -There is mild wound dehiscence-Ortho has evaluated the patient and at this time feels that taking her back to surgery is not a good option-her nutrition is poor and albumin is less than 2 and it is recommended that her nutritional status be improved prior to any procedures being done-[name] will continue to follow the wound in his office -Continue oral iron replacement- I will order a dose of Fereheme today  Paroxysmal A. fib/sick sinus syndrome status post pacemaker -Continue diltiazem, metoprolol and amiodarone -Anticoagulation was initially held due to acute blood loss into her leg -Since her last blood transfusion was on 12/17, I will continue to hold anticoagulation and follow Hb    Severe protein calorie malnutrition -Albumin noted to be 1.8 -Continue dietary supplements   Time spent in minutes: 35 DVT prophylaxis: held for now as wound continues to ooze Code Status: full code Family Communication:  Disposition Plan: SNF Consultants:   ID Ortho  PCCM   Procedures:   Closed reduction of right knee prothesis and drainage  Right AKA  Intubation/ extubation Antimicrobials:  Anti-infectives (From admission, onward)   Start     Dose/Rate Route Frequency Ordered Stop   03/02/19 0600  ceFAZolin (ANCEF) IVPB 2g/100 mL premix     2 g 200 mL/hr over 30 Minutes Intravenous On call to O.R. 03/01/19 1304 03/01/19 1526   02/23/19 0345  cefTRIAXone (ROCEPHIN) 2 g in sodium chloride 0.9 % 100 mL IVPB     2 g 200 mL/hr over 30 Minutes Intravenous Daily at bedtime 02/23/19 0336 04/12/19 2359       Objective: Vitals:  03/24/19 1924 03/25/19 0344 03/25/19 0500 03/25/19 0835  BP: 130/71 (!) 143/73  139/65  Pulse: 66 60  61  Resp:    17  Temp: 99 F (37.2 C) 98.2 F (36.8 C)  98.8 F (37.1 C)  TempSrc: Oral Oral  Oral  SpO2: 100% 98%  97%  Weight:   103.3 kg   Height:        Intake/Output Summary  (Last 24 hours) at 03/25/2019 1045 Last data filed at 03/24/2019 2127 Gross per 24 hour  Intake 969.6 ml  Output 900 ml  Net 69.6 ml   Filed Weights   03/20/19 0500 03/24/19 0500 03/25/19 0500  Weight: 102.1 kg 101.8 kg 103.3 kg    Examination: General exam: Appears comfortable  HEENT: PERRLA, oral mucosa moist, no sclera icterus or thrush Respiratory system: Clear to auscultation. Respiratory effort normal. Cardiovascular system: S1 & S2 heard, RRR.   Gastrointestinal system: Abdomen soft, non-tender, nondistended. Normal bowel sounds. Central nervous system: Alert and oriented. No focal neurological deficits. Extremities: No cyanosis, clubbing - right AKA noted Skin: No rashes or ulcers Psychiatry:  Mood & affect appropriate.     Data Reviewed: I have personally reviewed following labs and imaging studies  CBC: Recent Labs  Lab 03/21/19 0456 03/21/19 0456 03/22/19 0332 03/22/19 2016 03/23/19 0356 03/23/19 1632 03/24/19 0350 03/25/19 0357  WBC 16.4*  --  10.8*  --  7.9  --  7.5 6.5  HGB 7.6*  --  7.0* 7.6* 7.5* 8.9* 8.5* 8.4*  HCT 23.2*   < > 22.1* 23.8* 23.9* 28.2* 26.7* 27.1*  MCV 94.7  --  96.1  --  96.4  --  95.7 96.8  PLT 193  --  173  --  168  --  165 171   < > = values in this interval not displayed.   Basic Metabolic Panel: Recent Labs  Lab 03/20/19 0314 03/21/19 0456 03/22/19 0332 03/24/19 0350 03/25/19 0357  NA 139 138 140 141 140  K 4.7 4.5 4.3 3.9 3.9  CL 103 103 103 105 105  CO2 _0 GLUCOSE 133* 120* 90 101* 97  BUN 14 25* 25* 21 18  CREATININE 0.95 1.34* 1.31* 1.05* 1.00  CALCIUM 8.1* 7.6* 7.7* 8.1* 8.0*  MG 1.8  --   --   --   --    GFR: Estimated Creatinine Clearance: 56.4 mL/min (by C-G formula based on SCr of 1 mg/dL). Liver Function Tests: Recent Labs  Lab 03/25/19 0357  AST 29  ALT 25  ALKPHOS 92  BILITOT 0.8  PROT 5.3*  ALBUMIN 1.9*   No results for input(s): LIPASE, AMYLASE in the last 168 hours. No  results for input(s): AMMONIA in the last 168 hours. Coagulation Profile: Recent Labs  Lab 03/21/19 0456 03/22/19 0332 03/23/19 0356 03/24/19 0350 03/25/19 0357  INR 1.1 1.2 1.1 1.1 1.2   Cardiac Enzymes: No results for input(s): CKTOTAL, CKMB, CKMBINDEX, TROPONINI in the last 168 hours. BNP (last 3 results) No results for input(s): PROBNP in the last 8760 hours. HbA1C: No results for input(s): HGBA1C in the last 72 hours. CBG: Recent Labs  Lab 03/20/19 2100 03/21/19 0055 03/21/19 0447 03/21/19 0755 03/21/19 1156  GLUCAP 122* 137* 112* 115* 104*   Lipid Profile: No results for input(s): CHOL, HDL, LDLCALC, TRIG, CHOLHDL, LDLDIRECT in the last 72 hours. Thyroid Function Tests: No results for input(s): TSH, T4TOTAL, FREET4, T3FREE, THYROIDAB in the last 72 hours. Anemia Panel: Recent  Labs    03/22/19 1110  VITAMINB12 462  FERRITIN 853*  TIBC 154*  IRON 39   Urine analysis:    Component Value Date/Time   COLORURINE YELLOW 02/02/2019 1923   APPEARANCEUR CLEAR 02/02/2019 1923   LABSPEC 1.013 02/02/2019 1923   PHURINE 5.0 02/02/2019 1923   GLUCOSEU NEGATIVE 02/02/2019 1923   GLUCOSEU NEGATIVE 09/06/2017 1200   HGBUR SMALL (A) 02/02/2019 1923   BILIRUBINUR NEGATIVE 02/02/2019 1923   BILIRUBINUR Neg 10/18/2017 1453   [place] 02/02/2019 1923   PROTEINUR NEGATIVE 02/02/2019 1923   UROBILINOGEN 0.2 10/18/2017 1453   UROBILINOGEN 1.0 09/06/2017 1200   NITRITE POSITIVE (A) 02/02/2019 1923   LEUKOCYTESUR LARGE (A) 02/02/2019 1923   Sepsis Labs: _0 (procalcitonin:4,lacticidven:4) )No results found for this or any previous visit (from the past 240 hour(s)).       Radiology Studies: No results found.    Scheduled Meds: . sodium chloride   Intravenous Once  . amiodarone  200 mg Oral Daily  . chlorhexidine  15 mL Mouth Rinse BID  . chlorhexidine gluconate (MEDLINE KIT)  15 mL Mouth Rinse BID  . Chlorhexidine Gluconate Cloth  6 each Topical  Daily  . cholecalciferol  2,000 Units Oral BID  . diltiazem  120 mg Oral Daily  . feeding supplement (ENSURE ENLIVE)  237 mL Oral TID BM  . ferrous sulfate  300 mg Oral BID WC  . gabapentin  100 mg Oral TID  . Gerhardt's butt cream   Topical TID  . mouth rinse  15 mL Mouth Rinse q12n4p  . metoprolol tartrate  50 mg Oral BID  . multivitamin with minerals  1 tablet Oral Daily  . pantoprazole  40 mg Oral Daily  . pravastatin  20 mg Oral q1800  . senna-docusate  2 tablet Oral BID  . sodium chloride flush  10-40 mL Intracatheter Q12H  . sodium chloride flush  3 mL Intravenous Q12H  . Vitamin D (Ergocalciferol)  50,000 Units Oral Q7 days   Continuous Infusions: . sodium chloride 20 mL/hr at 03/24/19 1113  . sodium chloride Stopped (03/14/19 1958)  . cefTRIAXone (ROCEPHIN)  IV 2 g (03/24/19 2127)     LOS: 30 days      Debbe Odea, MD [facility] Pager: [email or web address] Password [email or web address] 03/25/2019, 10:45 AM

## 2019-03-25 NOTE — TOC Progression Note (Signed)
Transition of Care [facility]) - Progression Note    Patient Details  Name: MIAMI LATULIPPE MRN: [id number] Date of Birth: 1941-03-19  Transition [facility] CM/SW [place], [place] Phone Number: [phone] 03/25/2019, 10:31 AM  Clinical Narrative:     CSW spoke with daughter Janett Billow in regards to bed offer. Janett Billow chose [facility]. CSW followed up with Juliann Pulse at [facility] and she stated that they will most likely have a bed available on Monday.  CSW called [facility] to initiate authorization however it is not managed by [facility]. CSW alerted facility that they will need to begin authorization.  TOC team will continue to follow for discharge planning needs.       Expected Discharge Plan and Services                                                 Social Determinants of Health (SDOH) Interventions    Readmission Risk Interventions No flowsheet data found.

## 2019-03-25 NOTE — Plan of Care (Signed)
  Problem: Clinical Measurements: Goal: Will remain free from infection 03/25/2019 1201 by Williams Che, RN Outcome: Progressing 03/25/2019 1102 by Williams Che, RN Outcome: Progressing   Problem: Activity: Goal: Risk for activity intolerance will decrease 03/25/2019 1201 by Williams Che, RN Outcome: Progressing 03/25/2019 1102 by Williams Che, RN Outcome: Progressing   Problem: Pain Managment: Goal: General experience of comfort will improve 03/25/2019 1201 by Williams Che, RN Outcome: Progressing 03/25/2019 1102 by Williams Che, RN Outcome: Progressing   Problem: Skin Integrity: Goal: Risk for impaired skin integrity will decrease Outcome: Progressing

## 2019-03-25 NOTE — Plan of Care (Signed)
  Problem: Health Behavior/Discharge Planning: Goal: Ability to manage health-related needs will improve Outcome: Progressing   Problem: Pain Managment: Goal: General experience of comfort will improve Outcome: Progressing   

## 2019-03-26 LAB — PROTIME-INR
INR: 1.2 (ref 0.8–1.2)
Prothrombin Time: 14.8 seconds (ref 11.4–15.2)

## 2019-03-26 LAB — TYPE AND SCREEN
ABO/RH(D): A POS
Antibody Screen: POSITIVE
Donor AG Type: NEGATIVE
Donor AG Type: NEGATIVE
Unit division: 0
Unit division: 0
Unit division: 0
Unit division: 0

## 2019-03-26 LAB — CBC
HCT: 27.9 % — ABNORMAL LOW (ref 36.0–46.0)
Hemoglobin: 8.7 g/dL — ABNORMAL LOW (ref 12.0–15.0)
MCH: 30.4 pg (ref 26.0–34.0)
MCHC: 31.2 g/dL (ref 30.0–36.0)
MCV: 97.6 fL (ref 80.0–100.0)
Platelets: 182 10*3/uL (ref 150–400)
RBC: 2.86 MIL/uL — ABNORMAL LOW (ref 3.87–5.11)
RDW: 16.9 % — ABNORMAL HIGH (ref 11.5–15.5)
WBC: 6.2 10*3/uL (ref 4.0–10.5)
nRBC: 0.3 % — ABNORMAL HIGH (ref 0.0–0.2)

## 2019-03-26 LAB — BPAM RBC
Blood Product Expiration Date: 202101092359
Blood Product Expiration Date: 202101152359
Blood Product Expiration Date: 202101152359
Blood Product Expiration Date: 202101152359
ISSUE DATE / TIME: 202012121713
ISSUE DATE / TIME: 202012121713
ISSUE DATE / TIME: 202012161128
ISSUE DATE / TIME: 202012171057
Unit Type and Rh: 6200
Unit Type and Rh: 6200
Unit Type and Rh: 6200
Unit Type and Rh: 6200

## 2019-03-26 NOTE — Plan of Care (Signed)
  Problem: Skin Integrity: Goal: Risk for impaired skin integrity will decrease Outcome: Progressing   

## 2019-03-26 NOTE — Progress Notes (Signed)
PROGRESS NOTE    Robin Snow   VOH:[id number]  DOB: 1941/02/05  DOA: 02/22/2019 PCP: [name], MD   Brief Narrative:  [name]: Robin Snow a 78 y.o.femalewith medical history significant ofparoxysmal atrial fibrillationon anticoagulation Coumadin, hypertension, diabetes mellitustype II,andchronic kidney disease stage III. She presents with complaints right knee pain and swelling. Hospitalized from 10/29-11/4 with sepsis related to E. coli bacteremia, UTI, and bacterial arthritis right knee. Treated with Rocephin, then switched to Ancef, and discharged home to on Keflex to complete a 10-day course. Patient had initially done okay when she got home and had completed antibiotics as prescribed. 3 days ago while the daughter was helping her transition she had heard a loud pop in the right knee. Since that time she has had increasing pain and swelling of the right knee to the point in which she started developing fluid-filled blisters on her skin. Associated symptoms include increased warmth, but the patient has not had any fever. She had gone from being unable to bear weight on the right knee and now cannot have anyone touch it without crying out in pain.   Subjective: No complaints.   Hospital Course:   Sepsis secondary to endocarditis, E. coli bacteremia with infected prosthetic right knee-status post right above-the-knee amputation -03/08/2019 TEE revealed aortic vegetation-pacer wires did not have any vegetations -ID is recommended 6 weeks of ceftriaxone with a stop date of 1/6 -Per ID, it is reasonable to leave the pacemaker and at this time as no vegetations were noted on TEE  Right AKA with wound dehiscence and bleeding-acute blood loss anemia requiring blood transfusions Iron deficiency anemia -The patient was started on heparin after surgery and was noted to have significant amount of acute blood loss into the stump and required 6  units of packed red blood cells between 12/8 and 12/17 -There is mild wound dehiscence-Ortho has evaluated the patient and at this time feels that taking her back to surgery is not a good option-her nutrition is poor and albumin is less than 2 and it is recommended that her nutritional status be improved prior to any procedures being done-[name] will continue to follow the wound in his office -Continue oral iron replacement-given a dose of Fereheme on 12/19  Paroxysmal A. fib/sick sinus syndrome status post pacemaker -Continue diltiazem, metoprolol and amiodarone -Anticoagulation was initially held due to acute blood loss into her leg -Since her last blood transfusion was on 12/17, I will continue to hold anticoagulation and follow Hb    Severe protein calorie malnutrition -Albumin noted to be 1.8 -Continue dietary supplements   Time spent in minutes: 35 DVT prophylaxis: held for now as wound continues to ooze Code Status: full code Family Communication:  Disposition Plan: SNF Consultants:   ID Ortho  PCCM   Procedures:   Closed reduction of right knee prothesis and drainage  Right AKA  Intubation/ extubation Antimicrobials:  Anti-infectives (From admission, onward)   Start     Dose/Rate Route Frequency Ordered Stop   03/02/19 0600  ceFAZolin (ANCEF) IVPB 2g/100 mL premix     2 g 200 mL/hr over 30 Minutes Intravenous On call to O.R. 03/01/19 1304 03/01/19 1526   02/23/19 0345  cefTRIAXone (ROCEPHIN) 2 g in sodium chloride 0.9 % 100 mL IVPB     2 g 200 mL/hr over 30 Minutes Intravenous Daily at bedtime 02/23/19 0336 04/12/19 2359       Objective: Vitals:   03/25/19 1529 03/25/19 1945  03/26/19 0352 03/26/19 0840  BP: 115/72 127/73 134/73 (!) 143/91  Pulse: 60 60 61 62  Resp: '17 14 14 18  '$ Temp: 98.3 F (36.8 C) 99.5 F (37.5 C) 99.2 F (37.3 C) 98.4 F (36.9 C)  TempSrc: Oral Oral Oral Oral  SpO2: 98% 99% 95% 99%  Weight:      Height:         Intake/Output Summary (Last 24 hours) at 03/26/2019 1013 Last data filed at 03/26/2019 0900 Gross per 24 hour  Intake 888.89 ml  Output 1200 ml  Net -311.11 ml   Filed Weights   03/20/19 0500 03/24/19 0500 03/25/19 0500  Weight: 102.1 kg 101.8 kg 103.3 kg    Examination: General exam: Appears comfortable  HEENT: PERRLA, oral mucosa moist, no sclera icterus or thrush Respiratory system: Clear to auscultation. Respiratory effort normal. Cardiovascular system: S1 & S2 heard, RRR.   Gastrointestinal system: Abdomen soft, non-tender, nondistended. Normal bowel sounds. Central nervous system: Alert and oriented. No focal neurological deficits. Extremities: No cyanosis, clubbing - right AKA noted Skin: No rashes or ulcers Psychiatry:  Mood & affect appropriate.     Data Reviewed: I have personally reviewed following labs and imaging studies  CBC: Recent Labs  Lab 03/22/19 0332 03/22/19 0619 03/23/19 0356 03/23/19 1632 03/24/19 0350 03/25/19 0357 03/26/19 0434  WBC 10.8*  --  7.9  --  7.5 6.5 6.2  HGB 7.0*   < > 7.5* 8.9* 8.5* 8.4* 8.7*  HCT 22.1*   < > 23.9* 28.2* 26.7* 27.1* 27.9*  MCV 96.1  --  96.4  --  95.7 96.8 97.6  PLT 173  --  168  --  165 171 182   < > = values in this interval not displayed.   Basic Metabolic Panel: Recent Labs  Lab 03/20/19 0314 03/21/19 0456 03/22/19 0332 03/24/19 0350 03/25/19 0357  NA 139 138 140 141 140  K 4.7 4.5 4.3 3.9 3.9  CL 103 103 103 105 105  CO2 '26 27 29 27 26  '$ GLUCOSE 133* 120* 90 101* 97  BUN 14 25* 25* 21 18  CREATININE 0.95 1.34* 1.31* 1.05* 1.00  CALCIUM 8.1* 7.6* 7.7* 8.1* 8.0*  MG 1.8  --   --   --   --    GFR: Estimated Creatinine Clearance: 56.4 mL/min (by C-G formula based on SCr of 1 mg/dL). Liver Function Tests: Recent Labs  Lab 03/25/19 0357  AST 29  ALT 25  ALKPHOS 92  BILITOT 0.8  PROT 5.3*  ALBUMIN 1.9*   No results for input(s): LIPASE, AMYLASE in the last 168 hours. No results for  input(s): AMMONIA in the last 168 hours. Coagulation Profile: Recent Labs  Lab 03/22/19 0332 03/23/19 0356 03/24/19 0350 03/25/19 0357 03/26/19 0434  INR 1.2 1.1 1.1 1.2 1.2   Cardiac Enzymes: No results for input(s): CKTOTAL, CKMB, CKMBINDEX, TROPONINI in the last 168 hours. BNP (last 3 results) No results for input(s): PROBNP in the last 8760 hours. HbA1C: No results for input(s): HGBA1C in the last 72 hours. CBG: Recent Labs  Lab 03/20/19 2100 03/21/19 0055 03/21/19 0447 03/21/19 0755 03/21/19 1156  GLUCAP 122* 137* 112* 115* 104*   Lipid Profile: No results for input(s): CHOL, HDL, LDLCALC, TRIG, CHOLHDL, LDLDIRECT in the last 72 hours. Thyroid Function Tests: No results for input(s): TSH, T4TOTAL, FREET4, T3FREE, THYROIDAB in the last 72 hours. Anemia Panel: No results for input(s): VITAMINB12, FOLATE, FERRITIN, TIBC, IRON, RETICCTPCT in the last 72 hours.  Urine analysis:    Component Value Date/Time   COLORURINE YELLOW 02/02/2019 1923   APPEARANCEUR CLEAR 02/02/2019 1923   LABSPEC 1.013 02/02/2019 1923   PHURINE 5.0 02/02/2019 1923   GLUCOSEU NEGATIVE 02/02/2019 1923   GLUCOSEU NEGATIVE 09/06/2017 1200   HGBUR SMALL (A) 02/02/2019 1923   BILIRUBINUR NEGATIVE 02/02/2019 1923   BILIRUBINUR Neg 10/18/2017 1453   [place] 02/02/2019 1923   PROTEINUR NEGATIVE 02/02/2019 1923   UROBILINOGEN 0.2 10/18/2017 1453   UROBILINOGEN 1.0 09/06/2017 1200   NITRITE POSITIVE (A) 02/02/2019 1923   LEUKOCYTESUR LARGE (A) 02/02/2019 1923   Sepsis Labs: '@LABRCNTIP'$ (procalcitonin:4,lacticidven:4) )No results found for this or any previous visit (from the past 240 hour(s)).       Radiology Studies: No results found.    Scheduled Meds: . sodium chloride   Intravenous Once  . amiodarone  200 mg Oral Daily  . chlorhexidine  15 mL Mouth Rinse BID  . chlorhexidine gluconate (MEDLINE KIT)  15 mL Mouth Rinse BID  . Chlorhexidine Gluconate Cloth  6 each Topical  Daily  . cholecalciferol  2,000 Units Oral BID  . diltiazem  120 mg Oral Daily  . feeding supplement (ENSURE ENLIVE)  237 mL Oral TID BM  . ferrous sulfate  300 mg Oral BID WC  . gabapentin  100 mg Oral TID  . Gerhardt's butt cream   Topical TID  . mouth rinse  15 mL Mouth Rinse q12n4p  . metoprolol tartrate  50 mg Oral BID  . multivitamin with minerals  1 tablet Oral Daily  . pantoprazole  40 mg Oral Daily  . pravastatin  20 mg Oral q1800  . senna-docusate  2 tablet Oral BID  . sodium chloride flush  10-40 mL Intracatheter Q12H  . sodium chloride flush  3 mL Intravenous Q12H  . Vitamin D (Ergocalciferol)  50,000 Units Oral Q7 days   Continuous Infusions: . sodium chloride 20 mL/hr at 03/26/19 0227  . sodium chloride Stopped (03/14/19 1958)  . cefTRIAXone (ROCEPHIN)  IV 2 g (03/25/19 2324)     LOS: 31 days      Debbe Odea, MD [facility] Pager: [email or web address] Password [email or web address] 03/26/2019, 10:13 AM

## 2019-03-26 NOTE — Plan of Care (Signed)
  Problem: Activity: Goal: Risk for activity intolerance will decrease Outcome: Progressing   Problem: Pain Managment: Goal: General experience of comfort will improve Outcome: Progressing   Problem: Skin Integrity: Goal: Risk for impaired skin integrity will decrease Outcome: Progressing   

## 2019-03-27 LAB — CBC
HCT: 28 % — ABNORMAL LOW (ref 36.0–46.0)
Hemoglobin: 8.7 g/dL — ABNORMAL LOW (ref 12.0–15.0)
MCH: 30.2 pg (ref 26.0–34.0)
MCHC: 31.1 g/dL (ref 30.0–36.0)
MCV: 97.2 fL (ref 80.0–100.0)
Platelets: 175 10*3/uL (ref 150–400)
RBC: 2.88 MIL/uL — ABNORMAL LOW (ref 3.87–5.11)
RDW: 16.7 % — ABNORMAL HIGH (ref 11.5–15.5)
WBC: 6.6 10*3/uL (ref 4.0–10.5)
nRBC: 0 % (ref 0.0–0.2)

## 2019-03-27 LAB — PROTIME-INR
INR: 1.2 (ref 0.8–1.2)
Prothrombin Time: 14.7 seconds (ref 11.4–15.2)

## 2019-03-27 NOTE — Progress Notes (Signed)
Physical Therapy Treatment Patient Details Name: Robin Snow MRN: [id number] DOB: 07/04/1940 Today's Date: 03/27/2019    History of Present Illness 78  y/o female with history of atrial fibrillation, HTN, DM,CKD III had a fall at home when patient was trying to get out of the bed. Hx of R knee pain and swelling for the last few months. Found with sepsis secondary to E. coli bacteremia. Hospitalized from 10/29-11/4 with sepsis related to E. coli bacteremia, UTI, and bacterial arthritis right knee. Found to have dislocated right knee prothestic--02/22/19 s/p Closed reduction of right knee prosthetic dislocation. 11/25 underwent R AKA intubated with respiratory failure extubation 03/09/19    PT Comments    Pt pleasant and willing to mobilize. Pt remains extremely weak with bil UE function and trunk activation with all mobility. Pt continues to require max +2 assist for bed mobility and sitting EOB. With impaired healing of AKA pt not currently appropriate to attempt sliding board transfers and educated for HEp and continued focus on bed mobility and sitting balance.     Follow Up Recommendations  SNF;Supervision/Assistance - 24 hour     Equipment Recommendations  Wheelchair (measurements PT);Wheelchair cushion (measurements PT);Hospital bed    Recommendations for Other Services       Precautions / Restrictions Precautions Precautions: Fall Precaution Comments: R AKA with gangrenous changes Restrictions Weight Bearing Restrictions: Yes RLE Weight Bearing: Non weight bearing    Mobility  Bed Mobility Overal bed mobility: Needs Assistance Bed Mobility: Rolling;Sidelying to Sit;Sit to Supine Rolling: Max assist;+2 for physical assistance;+2 for safety/equipment;Mod assist Sidelying to sit: Max assist;+2 for physical assistance   Sit to supine: Max assist;+2 for physical assistance   General bed mobility comments: mod assist of 1 to roll to left with hand over hand assist and  cues for rail use and looking to target x 2 trials, max +2 to roll right with assist and cues for hand placement, knee flexion and seqeunce. Side to sit from left with max +2 assist with pt assisting with pushing into surface with increased time. pt with very limited assist with return to supine and total +2 to slide to [facility]. Pt remains unable to shift weight or scoot in sitting with decreased bil UE activation with cues in sitting  Transfers                 General transfer comment: unable  Ambulation/Gait                 Stairs             Wheelchair Mobility    Modified Rankin (Stroke Patients Only)       Balance Overall balance assessment: Needs assistance Sitting-balance support: Bilateral upper extremity supported;No upper extremity supported;Feet supported Sitting balance-Leahy Scale: Zero Sitting balance - Comments: EOB 12 min with progression of max to minguard assist with sitting. Pt with posterior left lean with increased time, cues and facilitation to achieve midline and only able to maintain minguard grossly 30 sec at a time. Facilitation for reaching forward and trunk engagement throughout Postural control: Posterior lean                                  Cognition Arousal/Alertness: Awake/alert Behavior During Therapy: Flat affect Overall Cognitive Status: Impaired/Different from baseline Area of Impairment: Orientation;Memory;Attention;Following commands;Safety/judgement  Orientation Level: Disoriented to;Time Current Attention Level: Sustained Memory: Decreased short-term memory Following Commands: Follows one step commands consistently;Follows one step commands with increased time Safety/Judgement: Decreased awareness of deficits;Decreased awareness of safety   Problem Solving: Slow processing;Decreased initiation;Requires verbal cues;Difficulty sequencing;Requires tactile cues General Comments: sequential  multimodal cues for mobility with difficulty looking to target      Exercises General Exercises - Lower Extremity Long Arc Quad: AAROM;Left;Seated;10 reps Heel Slides: AAROM;Left;Supine;10 reps Hip Flexion/Marching: AAROM;Left;Seated;10 reps    General Comments        Pertinent Vitals/Pain Pain Score: 8  Pain Location: any touch to residual limb Pain Descriptors / Indicators: Grimacing;Guarding Pain Intervention(s): Limited activity within patient's tolerance;Monitored during session;Premedicated before session;Repositioned    Home Living                      Prior Function            PT Goals (current goals can now be found in the care plan section) Progress towards PT goals: Progressing toward goals    Frequency    Min 2X/week      PT Plan Current plan remains appropriate    Co-evaluation PT/OT/SLP Co-Evaluation/Treatment: Yes Reason for Co-Treatment: Complexity of the patient's impairments (multi-system involvement);For patient/therapist safety PT goals addressed during session: Mobility/safety with mobility;Balance;Strengthening/ROM        AM-PAC PT "6 Clicks" Mobility   Outcome Measure  Help needed turning from your back to your side while in a flat bed without using bedrails?: Total Help needed moving from lying on your back to sitting on the side of a flat bed without using bedrails?: Total Help needed moving to and from a bed to a chair (including a wheelchair)?: Total Help needed standing up from a chair using your arms (e.g., wheelchair or bedside chair)?: Total Help needed to walk in hospital room?: Total Help needed climbing 3-5 steps with a railing? : Total 6 Click Score: 6    End of Session   Activity Tolerance: Patient tolerated treatment well Patient left: in bed;with call bell/phone within reach;with bed alarm set Nurse Communication: Mobility status;Need for lift equipment PT Visit Diagnosis: Difficulty in walking, not elsewhere  classified (R26.2);History of falling (Z91.81);Muscle weakness (generalized) (M62.81)     Time: 1610-9604 PT Time Calculation (min) (ACUTE ONLY): 31 min  Charges:  $Therapeutic Activity: 8-22 mins                     Isidro Monks P, PT [facility] Pager: [phone] Office: [phone]    Pratt Bress B Darrall Strey 03/27/2019, 1:21 PM

## 2019-03-27 NOTE — Plan of Care (Signed)
  Problem: Activity: Goal: Risk for activity intolerance will decrease Outcome: Progressing   Problem: Pain Managment: Goal: General experience of comfort will improve Outcome: Progressing   Problem: Skin Integrity: Goal: Risk for impaired skin integrity will decrease Outcome: Progressing   

## 2019-03-27 NOTE — Progress Notes (Signed)
Occupational Therapy Treatment Patient Details Name: Robin Snow MRN: [id number] DOB: Sep 12, 1940 Today's Date: 03/27/2019    History of present illness 78  y/o female with history of atrial fibrillation, HTN, DM,CKD III had a fall at home when patient was trying to get out of the bed. Hx of R knee pain and swelling for the last few months. Found with sepsis secondary to E. coli bacteremia. Hospitalized from 10/29-11/4 with sepsis related to E. coli bacteremia, UTI, and bacterial arthritis right knee. Found to have dislocated right knee prothestic--02/22/19 s/p Closed reduction of right knee prosthetic dislocation. 11/25 underwent R AKA intubated with respiratory failure extubation 03/09/19   OT comments  Pt in bed upon arrival and required max A + 2 for rolling and sup - sit. Pt sat EOB 12 min with progression of max to minguard assist with sitting for grooming and UB ADL tasks, reaching tasks for sitting balance/posture. Pt with posterior left lean with increased time, cues and facilitation to achieve midline and only able to maintain minguard grossly 30 sec at a time. Facilitation for reaching forward and trunk engagement throughout. OT will continue to follow acutely  Follow Up Recommendations  SNF;Supervision/Assistance - 24 hour    Equipment Recommendations  Wheelchair (measurements OT);Wheelchair cushion (measurements OT);Hospital bed;Tub/shower bench;3 in 1 bedside commode    Recommendations for Other Services      Precautions / Restrictions Precautions Precautions: Fall Precaution Comments: R AKA with gangrenous changes Restrictions Weight Bearing Restrictions: Yes RLE Weight Bearing: Non weight bearing Other Position/Activity Restrictions: R AKA       Mobility Bed Mobility Overal bed mobility: Needs Assistance Bed Mobility: Rolling;Sidelying to Sit;Sit to Supine Rolling: Max assist;+2 for physical assistance;+2 for safety/equipment;Mod assist Sidelying to sit: Max  assist;+2 for physical assistance   Sit to supine: Max assist;+2 for physical assistance   General bed mobility comments: mod assist of 1 to roll to left with hand over hand assist and cues for rail use and looking to target x 2 trials, max +2 to roll right with assist and cues for hand placement, knee flexion and seqeunce. Side to sit from left with max +2 assist with pt assisting with pushing into surface with increased time. pt with very limited assist with return to supine and total +2 to slide to [facility]. Pt remains unable to shift weight or scoot in sitting with decreased bil UE activation with cues in sitting  Transfers                 General transfer comment: unable    Balance Overall balance assessment: Needs assistance Sitting-balance support: Bilateral upper extremity supported;No upper extremity supported;Feet supported Sitting balance-Leahy Scale: Zero Sitting balance - Comments: EOB 12 min with progression of max to minguard assist with sitting. Pt with posterior left lean with increased time, cues and facilitation to achieve midline and only able to maintain minguard grossly 30 sec at a time. Facilitation for reaching forward and trunk engagement throughout Postural control: Posterior lean                                 ADL either performed or assessed with clinical judgement   ADL Overall ADL's : Needs assistance/impaired     Grooming: Wash/dry hands;Wash/dry face;Min guard;Sitting   Upper Body Bathing: Moderate assistance;Sitting Upper Body Bathing Details (indicate cue type and reason): simulatede Lower Body Bathing: Total assistance Lower Body Bathing Details (indicate cue  type and reason): simulated Upper Body Dressing : Moderate assistance;Sitting                   Functional mobility during ADLs: Maximal assistance;+2 for physical assistance       Vision Baseline Vision/History: Wears glasses Wears Glasses: Reading only Patient  Visual Report: No change from baseline     Perception     Praxis      Cognition Arousal/Alertness: Awake/alert Behavior During Therapy: Flat affect Overall Cognitive Status: Impaired/Different from baseline Area of Impairment: Orientation;Memory;Attention;Following commands;Safety/judgement                 Orientation Level: Disoriented to;Time Current Attention Level: Sustained Memory: Decreased short-term memory Following Commands: Follows one step commands consistently;Follows one step commands with increased time Safety/Judgement: Decreased awareness of deficits;Decreased awareness of safety   Problem Solving: Slow processing;Decreased initiation;Requires verbal cues;Difficulty sequencing;Requires tactile cues General Comments: sequential multimodal cues for mobility with difficulty looking to target        Exercises General Exercises - Lower Extremity Long Arc Quad: AAROM;Left;Seated;10 reps Heel Slides: AAROM;Left;Supine;10 reps Hip Flexion/Marching: AAROM;Left;Seated;10 reps   Shoulder Instructions       General Comments      Pertinent Vitals/ Pain       Pain Assessment: 0-10 Pain Score: 8  Pain Location: any touch to residual limb Pain Descriptors / Indicators: Grimacing;Guarding Pain Intervention(s): Limited activity within patient's tolerance;Monitored during session;Premedicated before session;Repositioned  Home Living                                          Prior Functioning/Environment              Frequency  Min 2X/week        Progress Toward Goals  OT Goals(current goals can now be found in the care plan section)  Progress towards OT goals: OT to reassess next treatment  ADL Goals Pt Will Perform Grooming: with supervision;with set-up;sitting Pt Will Perform Upper Body Bathing: with min assist;sitting Pt Will Perform Lower Body Bathing: with mod assist;sitting/lateral leans Pt Will Perform Upper Body Dressing:  with min assist;sitting Pt Will Transfer to Toilet: with max assist;with +2 assist;bedside commode;with transfer board  Plan Discharge plan remains appropriate    Co-evaluation    PT/OT/SLP Co-Evaluation/Treatment: Yes Reason for Co-Treatment: Complexity of the patient's impairments (multi-system involvement);For patient/therapist safety PT goals addressed during session: Mobility/safety with mobility;Balance;Strengthening/ROM OT goals addressed during session: ADL's and self-care      AM-PAC OT "6 Clicks" Daily Activity     Outcome Measure   Help from another person eating meals?: None Help from another person taking care of personal grooming?: A Little Help from another person toileting, which includes using toliet, bedpan, or urinal?: Total Help from another person bathing (including washing, rinsing, drying)?: Total Help from another person to put on and taking off regular upper body clothing?: A Lot Help from another person to put on and taking off regular lower body clothing?: Total 6 Click Score: 12    End of Session    OT Visit Diagnosis: Unsteadiness on feet (R26.81);Muscle weakness (generalized) (M62.81);Pain Pain - Right/Left: Right Pain - part of body: Leg   Activity Tolerance Patient limited by fatigue;No increased pain   Patient Left in bed;with call bell/phone within reach;with bed alarm set   Nurse Communication  Time: 2409-7353 OT Time Calculation (min): 36 min  Charges: OT Treatments $Therapeutic Activity: 8-22 mins     Galen Manila 03/27/2019, 1:49 PM

## 2019-03-27 NOTE — Progress Notes (Signed)
PROGRESS NOTE    Robin Snow   POE:[id number]  DOB: [birth year]  DOA: 02/22/2019 PCP: [name], MD   Brief Narrative:  Robin Snow: Robin Snow a 78 y.o.femalewith medical history significant ofparoxysmal atrial fibrillationon anticoagulation Coumadin, hypertension, diabetes mellitustype II,andchronic kidney disease stage III. She presents with complaints right knee pain and swelling. Hospitalized from 10/29-11/4 with sepsis related to E. coli bacteremia, UTI, and bacterial arthritis right knee. Treated with Rocephin, then switched to Ancef, and discharged home to on Keflex to complete a 10-day course. Patient had initially done okay when she got home and had completed antibiotics as prescribed. 3 days ago while the daughter was helping her transition she had heard a loud pop in the right knee. Since that time she has had increasing pain and swelling of the right knee to the point in which she started developing fluid-filled blisters on her skin. Associated symptoms include increased warmth, but the patient has not had any fever. She had gone from being unable to bear weight on the right knee and now cannot have anyone touch it without crying out in pain.   Subjective: The patient was seen and examined this morning, remained stable no acute distress.   No issues overnight.   Hospital Course:   Sepsis secondary to endocarditis, E. coli bacteremia with infected prosthetic right knee-status post right above-the-knee amputation -Remained stable, hemodynamically stable -03/08/2019 TEE revealed aortic vegetation-pacer wires did not have any vegetations -ID is recommended 6 weeks of ceftriaxone with a stop date of 04/12/2019 -Per ID, it is reasonable to leave the pacemaker and at this time as no vegetations were noted on TEE  Right AKA with wound dehiscence and bleeding-acute blood loss anemia requiring blood transfusions Iron deficiency  anemia -Monitoring H&H, stable -The patient was started on heparin after surgery and was noted to have significant amount of acute blood loss into the stump and required 6 units of packed red blood cells between 12/8 and 12/17 -There is mild wound dehiscence-Ortho has evaluated the patient and at this time feels that taking her back to surgery is not a good option -her nutrition is poor and albumin is less than 2 and it is recommended that her nutritional status be improved prior to any procedures being done -[name] will continue to follow the wound in his office -Continue oral iron replacement-given a dose of Fereheme on 12/19  Paroxysmal A. fib/sick sinus syndrome status post pacemaker -Currently normal sinus rhythm, heart rate of 60s -Continue diltiazem, metoprolol and amiodarone -Anticoagulation was initially held due to acute blood loss into her leg -Since her last blood transfusion was on 12/17, I will continue to hold anticoagulation and follow Hb    Severe protein calorie malnutrition -Albumin noted to be 1.8 -Continue dietary supplements   Time spent in minutes: 35 DVT prophylaxis: held for now as wound continues to ooze Code Status: full code Family Communication: Plan of care discussed with patient no family member present at bedside Disposition Plan: SNF -pending placement, CSW following, pending insurance approval, authorization to be discharged to [place] care, SNF in 1 to 2 days  Consultants:   ID Ortho  PCCM   Procedures:   Closed reduction of right knee prothesis and drainage  Right AKA  Intubation/ extubation Antimicrobials:  Anti-infectives (From admission, onward)   Start     Dose/Rate Route Frequency Ordered Stop   03/02/19 0600  ceFAZolin (ANCEF) IVPB 2g/100 mL premix  2 g 200 mL/hr over 30 Minutes Intravenous On call to O.R. 03/01/19 1304 03/01/19 1526   02/23/19 0345  cefTRIAXone (ROCEPHIN) 2 g in sodium chloride 0.9 % 100 mL IVPB      2 g 200 mL/hr over 30 Minutes Intravenous Daily at bedtime 02/23/19 0336 04/12/19 2359       Objective: Intake/Output Summary (Last 24 hours) at 03/27/2019 1214 Last data filed at 03/26/2019 1700 Gross per 24 hour  Intake 240 ml  Output 600 ml  Net -360 ml   Filed Weights   03/24/19 0500 03/25/19 0500 03/27/19 0550  Weight: 101.8 kg 103.3 kg 100.3 kg    BP (!) 142/68 (BP Location: Left Arm)   Pulse 60   Temp 98.9 F (37.2 C) (Oral)   Resp 20   Ht '5\' 7"'$  (1.702 m)   Wt 100.3 kg   SpO2 97%   BMI 34.63 kg/m    Physical Exam  Constitution:  Alert, cooperative, no distress,  Psychiatric: Normal and stable mood and affect, cognition intact,   HEENT: Normocephalic, PERRL, otherwise with in Normal limits  Chest:Chest symmetric Cardio vascular:  S1/S2, RRR, No murmure, No Rubs or Gallops  pulmonary: Clear to auscultation bilaterally, respirations unlabored, negative wheezes / crackles Abdomen: Soft, non-tender, non-distended, bowel sounds,no masses, no organomegaly Muscular skeletal: Limited exam - in bed, able to move all 4 extremities, Normal strength,  Neuro: CNII-XII intact. , normal motor and sensation, reflexes intact  Extremities: No pitting edema lower extremities, +2 pulses, right AKA Skin: Dry, warm to touch, negative for any Rashes, postsurgical wound Wounds: per nursing documentation Pressure Injury 03/01/19 Buttocks Medial Stage II -  Partial thickness loss of dermis presenting as a shallow open ulcer with a red, pink wound bed without slough. (Active)  03/01/19 1732  Location: Buttocks  Location Orientation: Medial  Staging: Stage II -  Partial thickness loss of dermis presenting as a shallow open ulcer with a red, pink wound bed without slough.  Wound Description (Comments):   Present on Admission: Yes     Pressure Injury 03/03/19 Thigh Right Stage II -  Partial thickness loss of dermis presenting as a shallow open ulcer with a red, pink wound bed without  slough. (Active)  03/03/19 1000  Location: Thigh  Location Orientation: Right  Staging: Stage II -  Partial thickness loss of dermis presenting as a shallow open ulcer with a red, pink wound bed without slough.  Wound Description (Comments):   Present on Admission:         Data Reviewed: I have personally reviewed following labs and imaging studies  CBC: Recent Labs  Lab 03/23/19 0356 03/23/19 1632 03/24/19 0350 03/25/19 0357 03/26/19 0434 03/27/19 0427  WBC 7.9  --  7.5 6.5 6.2 6.6  HGB 7.5* 8.9* 8.5* 8.4* 8.7* 8.7*  HCT 23.9* 28.2* 26.7* 27.1* 27.9* 28.0*  MCV 96.4  --  95.7 96.8 97.6 97.2  PLT 168  --  165 171 182 952   Basic Metabolic Panel: Recent Labs  Lab 03/21/19 0456 03/22/19 0332 03/24/19 0350 03/25/19 0357  NA 138 140 141 140  K 4.5 4.3 3.9 3.9  CL 103 103 105 105  CO2 '27 29 27 26  '$ GLUCOSE 120* 90 101* 97  BUN 25* 25* 21 18  CREATININE 1.34* 1.31* 1.05* 1.00  CALCIUM 7.6* 7.7* 8.1* 8.0*   GFR: Estimated Creatinine Clearance: 55.5 mL/min (by C-G formula based on SCr of 1 mg/dL). Liver Function Tests: Recent Labs  Lab  03/25/19 0357  AST 29  ALT 25  ALKPHOS 92  BILITOT 0.8  PROT 5.3*  ALBUMIN 1.9*    Coagulation Profile: Recent Labs  Lab 03/23/19 0356 03/24/19 0350 03/25/19 0357 03/26/19 0434 03/27/19 0427  INR 1.1 1.1 1.2 1.2 1.2   Recent Labs  Lab 03/20/19 2100 03/21/19 0055 03/21/19 0447 03/21/19 0755 03/21/19 1156  GLUCAP 122* 137* 112* 115* 104*  Urine analysis:    Component Value Date/Time   COLORURINE YELLOW 02/02/2019 1923   APPEARANCEUR CLEAR 02/02/2019 1923   LABSPEC 1.013 02/02/2019 1923   PHURINE 5.0 02/02/2019 1923   GLUCOSEU NEGATIVE 02/02/2019 1923   GLUCOSEU NEGATIVE 09/06/2017 1200   HGBUR SMALL (A) 02/02/2019 1923   BILIRUBINUR NEGATIVE 02/02/2019 1923   BILIRUBINUR Neg 10/18/2017 [place] 02/02/2019 1923   PROTEINUR NEGATIVE 02/02/2019 1923   UROBILINOGEN 0.2 10/18/2017 1453    UROBILINOGEN 1.0 09/06/2017 1200   NITRITE POSITIVE (A) 02/02/2019 1923   LEUKOCYTESUR LARGE (A) 02/02/2019 1923  Radiology Studies: No results found.    Scheduled Meds: . sodium chloride   Intravenous Once  . amiodarone  200 mg Oral Daily  . chlorhexidine  15 mL Mouth Rinse BID  . chlorhexidine gluconate (MEDLINE KIT)  15 mL Mouth Rinse BID  . Chlorhexidine Gluconate Cloth  6 each Topical Daily  . cholecalciferol  2,000 Units Oral BID  . diltiazem  120 mg Oral Daily  . feeding supplement (ENSURE ENLIVE)  237 mL Oral TID BM  . ferrous sulfate  300 mg Oral BID WC  . gabapentin  100 mg Oral TID  . Gerhardt's butt cream   Topical TID  . mouth rinse  15 mL Mouth Rinse q12n4p  . metoprolol tartrate  50 mg Oral BID  . multivitamin with minerals  1 tablet Oral Daily  . pantoprazole  40 mg Oral Daily  . pravastatin  20 mg Oral q1800  . senna-docusate  2 tablet Oral BID  . sodium chloride flush  10-40 mL Intracatheter Q12H  . sodium chloride flush  3 mL Intravenous Q12H  . Vitamin D (Ergocalciferol)  50,000 Units Oral Q7 days   Continuous Infusions: . sodium chloride 20 mL/hr at 03/26/19 0227  . sodium chloride Stopped (03/14/19 1958)  . cefTRIAXone (ROCEPHIN)  IV 2 g (03/27/19 0002)     LOS: 32 days  Deatra James, MD [facility] Pager: [email or web address] Password [facility] 03/27/2019, 12:14 PM

## 2019-03-27 NOTE — Plan of Care (Signed)
  Problem: Health Behavior/Discharge Planning: Goal: Ability to manage health-related needs will improve Outcome: Progressing   Problem: Activity: Goal: Risk for activity intolerance will decrease Outcome: Progressing   Problem: Pain Managment: Goal: General experience of comfort will improve Outcome: Progressing   Problem: Skin Integrity: Goal: Risk for impaired skin integrity will decrease Outcome: Progressing   

## 2019-03-27 NOTE — Care Management Important Message (Signed)
Important Message  Patient Details  Name: Robin Snow MRN: [id number] Date of Birth: [birth year]   Medicare Important Message Given:  Yes     Memory Argue 03/27/2019, 1:51 PM

## 2019-03-28 LAB — SARS CORONAVIRUS 2 (TAT 6-24 HRS): SARS Coronavirus 2: NEGATIVE

## 2019-03-28 LAB — PROTIME-INR
INR: 1.2 (ref 0.8–1.2)
Prothrombin Time: 14.6 seconds (ref 11.4–15.2)

## 2019-03-28 LAB — CBC
HCT: 29.8 % — ABNORMAL LOW (ref 36.0–46.0)
Hemoglobin: 9.1 g/dL — ABNORMAL LOW (ref 12.0–15.0)
MCH: 29.9 pg (ref 26.0–34.0)
MCHC: 30.5 g/dL (ref 30.0–36.0)
MCV: 98 fL (ref 80.0–100.0)
Platelets: 220 10*3/uL (ref 150–400)
RBC: 3.04 MIL/uL — ABNORMAL LOW (ref 3.87–5.11)
RDW: 16.3 % — ABNORMAL HIGH (ref 11.5–15.5)
WBC: 8.8 10*3/uL (ref 4.0–10.5)
nRBC: 0 % (ref 0.0–0.2)

## 2019-03-28 NOTE — Progress Notes (Signed)
Nutrition Follow-up  DOCUMENTATION CODES:   Obesity unspecified  INTERVENTION:   -Continue MVI with minerals daily -ContinueEnsure Enlive po TID, each supplement provides 350 kcal and 20 grams of protein -Continue Magic cup TID with meals, each supplement provides 290 kcal and 9 grams of protein  NUTRITION DIAGNOSIS:   Increased nutrient needs related to wound healing as evidenced by estimated needs.  Ongoing  GOAL:   Patient will meet greater than or equal to 90% of their needs  Progressing   MONITOR:   PO intake, Supplement acceptance, Labs, Weight trends, Skin  REASON FOR ASSESSMENT:   Consult, Ventilator Enteral/tube feeding initiation and management  ASSESSMENT:   Pt with PMH of DM, HTN, morbid obesity, and GER who had R knee replacement s/p revision in 2018 now admitted with prosthetic dislocation and abscess s/p closed reduction and drainage 11/18 treating for e.coli bacteremia now s/p R AKA 11/25 with wound VAC.  11/28 trickle TF started 12/2 TEE 12/3extubated 12/4 diet advanced(dysphagia 3 diet with thin liquids) 12/8- wound vac d/c by orthopedics  Reviewed I/O's: -700 ml x 24 hours and -29 ml since 03/14/19  UOP: 700 ml x 24 hours  Pt sleeping soundly at visit and did not respond to voice.   Meal intake continue to improve; documented at 50-100%. Pt also consuming Ensure supplements.   Plan to discharge to SNF once bed is available.   Labs reviewed.   Diet Order:   Diet Order            Diet Heart Room service appropriate? Yes; Fluid consistency: Thin  Diet effective now              EDUCATION NEEDS:   No education needs have been identified at this time  Skin:  Skin Assessment: Skin Integrity Issues: Skin Integrity Issues:: Stage II, Incisions Stage II: buttocks, R thigh Incisions: rt AKA, wound vac d/c on 12/8  Last BM:  03/18/19  Height:   Ht Readings from Last 1 Encounters:  03/01/19 5\' 7"  (1.702 m)    Weight:   Wt  Readings from Last 1 Encounters:  03/27/19 100.3 kg    Ideal Body Weight:  56.3 kg(adjusted for R AKA)  BMI:  Body mass index is 34.63 kg/m.  Estimated Nutritional Needs:   Kcal:  1900-2200  Protein:  115-130 grams  Fluid:  > 1.9 L/day    Zanyla Klebba A. Jimmye Norman, RD, LDN, [place] Registered Dietitian II Certified Diabetes Care and Education Specialist Pager: [phone] After hours Pager: [phone]

## 2019-03-28 NOTE — Progress Notes (Signed)
PROGRESS NOTE    [name]   TUU:[id number]  DOB: 1941/02/17  DOA: 02/22/2019 PCP: [name], MD   Brief Narrative:  Robin Snow HPI: Robin Snow a 78 y.o.femalewith medical history significant ofparoxysmal atrial fibrillationon anticoagulation Coumadin, hypertension, diabetes mellitustype II,andchronic kidney disease stage III. She presents with complaints right knee pain and swelling. Hospitalized from 10/29-11/4 with sepsis related to E. coli bacteremia, UTI, and bacterial arthritis right knee. Treated with Rocephin, then switched to Ancef, and discharged home to on Keflex to complete a 10-day course. Patient had initially done okay when she got home and had completed antibiotics as prescribed. 3 days ago while the daughter was helping her transition she had heard a loud pop in the right knee. Since that time she has had increasing pain and swelling of the right knee to the point in which she started developing fluid-filled blisters on her skin. Associated symptoms include increased warmth, but the patient has not had any fever. She had gone from being unable to bear weight on the right knee and now cannot have anyone touch it without crying out in pain.   Subjective:  Patient was seen and examined this morning, sleepy, lethargic hemodynamically stable. No complaints.  Reported she has no appetite for breakfast. No issues overnight.   Hospital Course:   Sepsis secondary to endocarditis, E. coli bacteremia with infected prosthetic right knee-status post right above-the-knee amputation -Hemodynamically stable, -03/08/2019 TEE revealed aortic vegetation-pacer wires did not have any vegetations -ID is recommended 6 weeks of ceftriaxone with a stop date of 04/12/2019 -Per ID, it is reasonable to leave the pacemaker and at this time as no vegetations were noted on TEE  Right AKA with wound dehiscence and bleeding-acute blood loss anemia requiring  blood transfusions/ chronic iron deficiency anemia -Monitoring, H&H stable -The patient was started on heparin after surgery and was noted to have significant amount of acute blood loss into the stump and required 6 units of packed red blood cells between 12/8 and 12/17 -There is mild wound dehiscence-Ortho has evaluated the patient and at this time feels that taking her back to surgery is not a good option -her nutrition is poor and albumin is less than 2 and it is recommended that her nutritional status be improved prior to any procedures being done -[name] will continue to follow the wound in his office -Continue oral iron replacement-given a dose of Fereheme on 12/19  Paroxysmal A. fib/sick sinus syndrome status post pacemaker -Currently normal sinus rhythm, heart rate of 60s -Continue diltiazem, metoprolol and amiodarone -Anticoagulation was initially held due to acute blood loss into her leg -Since her last blood transfusion was on 12/17, I will continue to hold anticoagulation and follow Hb    Severe protein calorie malnutrition -Albumin was as low as 1.8 -Continue dietary supplements   Time spent in minutes: 35 DVT prophylaxis: held for now as wound continues to ooze Code Status: full code Family Communication: Plan of care discussed with patient no family member present at bedside Disposition Plan: SNF -pending placement, CSW following, pending insurance approval, authorization to be discharged to [place] care, SNF in 1- 2 days  Consultants:   ID Ortho  PCCM   Procedures:   Closed reduction of right knee prothesis and drainage  Right AKA  Intubation/ extubation Antimicrobials:  Anti-infectives (From admission, onward)   Start     Dose/Rate Route Frequency Ordered Stop   03/02/19 0600  ceFAZolin (ANCEF) IVPB 2g/100  mL premix     2 g 200 mL/hr over 30 Minutes Intravenous On call to O.R. 03/01/19 1304 03/01/19 1526   02/23/19 0345  cefTRIAXone (ROCEPHIN)  2 g in sodium chloride 0.9 % 100 mL IVPB     2 g 200 mL/hr over 30 Minutes Intravenous Daily at bedtime 02/23/19 0336 04/12/19 2359       Objective:  Intake/Output Summary (Last 24 hours) at 03/28/2019 1135 Last data filed at 03/28/2019 1109 Gross per 24 hour  Intake 3 ml  Output 700 ml  Net -697 ml   Filed Weights   03/24/19 0500 03/25/19 0500 03/27/19 0550  Weight: 101.8 kg 103.3 kg 100.3 kg       BP 140/68   Pulse 61   Temp 98.7 F (37.1 C) (Oral)   Resp 17   Ht _0  (1.702 m)   Wt 100.3 kg   SpO2 97%   BMI 34.63 kg/m    Physical Exam  Constitution:  Alert, cooperative, no distress,  Psychiatric: Normal and stable mood and affect, cognition intact,   HEENT: Normocephalic, PERRL, otherwise with in Normal limits  Chest:Chest symmetric Cardio vascular:  S1/S2, RRR, No murmure, No Rubs or Gallops  pulmonary: Clear to auscultation bilaterally, respirations unlabored, negative wheezes / crackles Abdomen: Soft, non-tender, non-distended, bowel sounds,no masses, no organomegaly Muscular skeletal: Limited exam - in bed, able to move all 4 extremities, Normal strength,  Neuro: CNII-XII intact. , normal motor and sensation, reflexes intact  Extremities: No pitting edema lower extremities, +2 pulses, Rt AKA   Skin: Dry, warm to touch, negative for any Rashes, open wounds Wounds: per nursing documentation Pressure Injury 03/01/19 Buttocks Medial Stage II -  Partial thickness loss of dermis presenting as a shallow open ulcer with a red, pink wound bed without slough. (Active)  03/01/19 1732  Location: Buttocks  Location Orientation: Medial  Staging: Stage II -  Partial thickness loss of dermis presenting as a shallow open ulcer with a red, pink wound bed without slough.  Wound Description (Comments):   Present on Admission: Yes     Pressure Injury 03/03/19 Thigh Right Stage II -  Partial thickness loss of dermis presenting as a shallow open ulcer with a red, pink wound  bed without slough. (Active)  03/03/19 1000  Location: Thigh  Location Orientation: Right  Staging: Stage II -  Partial thickness loss of dermis presenting as a shallow open ulcer with a red, pink wound bed without slough.  Wound Description (Comments):   Present on Admission:      Data Reviewed: I have personally reviewed following labs and imaging studies  CBC: Recent Labs  Lab 03/24/19 0350 03/25/19 0357 03/26/19 0434 03/27/19 0427 03/28/19 0335  WBC 7.5 6.5 6.2 6.6 8.8  HGB 8.5* 8.4* 8.7* 8.7* 9.1*  HCT 26.7* 27.1* 27.9* 28.0* 29.8*  MCV 95.7 96.8 97.6 97.2 98.0  PLT 165 171 182 175 677   Basic Metabolic Panel: Recent Labs  Lab 03/22/19 0332 03/24/19 0350 03/25/19 0357  NA 140 141 140  K 4.3 3.9 3.9  CL 103 105 105  CO2 _1 GLUCOSE 90 101* 97  BUN 25* 21 18  CREATININE 1.31* 1.05* 1.00  CALCIUM 7.7* 8.1* 8.0*   GFR: Estimated Creatinine Clearance: 55.5 mL/min (by C-G formula based on SCr of 1 mg/dL). Liver Function Tests: Recent Labs  Lab 03/25/19 0357  AST 29  ALT 25  ALKPHOS 92  BILITOT 0.8  PROT 5.3*  ALBUMIN  1.9*    Coagulation Profile: Recent Labs  Lab 03/24/19 0350 03/25/19 0357 03/26/19 0434 03/27/19 0427 03/28/19 0335  INR 1.1 1.2 1.2 1.2 1.2   Recent Labs  Lab 03/21/19 1156  GLUCAP 104*  Urine analysis:    Component Value Date/Time   COLORURINE YELLOW 02/02/2019 1923   APPEARANCEUR CLEAR 02/02/2019 1923   LABSPEC 1.013 02/02/2019 1923   PHURINE 5.0 02/02/2019 1923   GLUCOSEU NEGATIVE 02/02/2019 1923   GLUCOSEU NEGATIVE 09/06/2017 1200   HGBUR SMALL (A) 02/02/2019 1923   BILIRUBINUR NEGATIVE 02/02/2019 1923   BILIRUBINUR Neg 10/18/2017 1453   [place] 02/02/2019 1923   PROTEINUR NEGATIVE 02/02/2019 1923   UROBILINOGEN 0.2 10/18/2017 1453   UROBILINOGEN 1.0 09/06/2017 1200   NITRITE POSITIVE (A) 02/02/2019 1923   LEUKOCYTESUR LARGE (A) 02/02/2019 1923  Radiology Studies: No results  found.    Scheduled Meds: . sodium chloride   Intravenous Once  . amiodarone  200 mg Oral Daily  . chlorhexidine  15 mL Mouth Rinse BID  . chlorhexidine gluconate (MEDLINE KIT)  15 mL Mouth Rinse BID  . Chlorhexidine Gluconate Cloth  6 each Topical Daily  . cholecalciferol  2,000 Units Oral BID  . diltiazem  120 mg Oral Daily  . feeding supplement (ENSURE ENLIVE)  237 mL Oral TID BM  . ferrous sulfate  300 mg Oral BID WC  . gabapentin  100 mg Oral TID  . Gerhardt's butt cream   Topical TID  . mouth rinse  15 mL Mouth Rinse q12n4p  . metoprolol tartrate  50 mg Oral BID  . multivitamin with minerals  1 tablet Oral Daily  . pantoprazole  40 mg Oral Daily  . pravastatin  20 mg Oral q1800  . senna-docusate  2 tablet Oral BID  . sodium chloride flush  10-40 mL Intracatheter Q12H  . sodium chloride flush  3 mL Intravenous Q12H  . Vitamin D (Ergocalciferol)  50,000 Units Oral Q7 days   Continuous Infusions: . sodium chloride 20 mL/hr at 03/26/19 0227  . sodium chloride Stopped (03/14/19 1958)  . cefTRIAXone (ROCEPHIN)  IV 2 g (03/27/19 2113)     LOS: 33 days  Deatra James, MD [facility] Pager: [email or web address] Password [facility] 03/28/2019, 11:35 AM

## 2019-03-28 NOTE — Plan of Care (Signed)
Problem: Health Behavior/Discharge Planning: Goal: Ability to manage health-related needs will improve Outcome: Progressing   Problem: Clinical Measurements: Goal: Will remain free from infection Outcome: Progressing   Problem: Activity: Goal: Risk for activity intolerance will decrease Outcome: Progressing   Problem: Pain Managment: Goal: General experience of comfort will improve Outcome: Progressing   Problem: Skin Integrity: Goal: Risk for impaired skin integrity will decrease Outcome: Progressing   

## 2019-03-29 DIAGNOSIS — R404 Transient alteration of awareness: Secondary | ICD-10-CM | POA: Diagnosis not present

## 2019-03-29 DIAGNOSIS — N183 Chronic kidney disease, stage 3 unspecified: Secondary | ICD-10-CM | POA: Diagnosis not present

## 2019-03-29 DIAGNOSIS — T8130XA Disruption of wound, unspecified, initial encounter: Secondary | ICD-10-CM | POA: Diagnosis not present

## 2019-03-29 DIAGNOSIS — M6281 Muscle weakness (generalized): Secondary | ICD-10-CM | POA: Diagnosis not present

## 2019-03-29 DIAGNOSIS — B9620 Unspecified Escherichia coli [E. coli] as the cause of diseases classified elsewhere: Secondary | ICD-10-CM | POA: Diagnosis not present

## 2019-03-29 DIAGNOSIS — R0689 Other abnormalities of breathing: Secondary | ICD-10-CM | POA: Diagnosis not present

## 2019-03-29 DIAGNOSIS — R52 Pain, unspecified: Secondary | ICD-10-CM | POA: Diagnosis not present

## 2019-03-29 DIAGNOSIS — I4891 Unspecified atrial fibrillation: Secondary | ICD-10-CM | POA: Diagnosis not present

## 2019-03-29 DIAGNOSIS — N1831 Chronic kidney disease, stage 3a: Secondary | ICD-10-CM | POA: Diagnosis not present

## 2019-03-29 DIAGNOSIS — R7881 Bacteremia: Secondary | ICD-10-CM | POA: Diagnosis not present

## 2019-03-29 DIAGNOSIS — I330 Acute and subacute infective endocarditis: Secondary | ICD-10-CM | POA: Diagnosis not present

## 2019-03-29 DIAGNOSIS — E118 Type 2 diabetes mellitus with unspecified complications: Secondary | ICD-10-CM | POA: Diagnosis not present

## 2019-03-29 DIAGNOSIS — T8781 Dehiscence of amputation stump: Secondary | ICD-10-CM | POA: Diagnosis not present

## 2019-03-29 DIAGNOSIS — L02415 Cutaneous abscess of right lower limb: Secondary | ICD-10-CM | POA: Diagnosis not present

## 2019-03-29 DIAGNOSIS — Z89611 Acquired absence of right leg above knee: Secondary | ICD-10-CM | POA: Diagnosis not present

## 2019-03-29 DIAGNOSIS — I10 Essential (primary) hypertension: Secondary | ICD-10-CM | POA: Diagnosis not present

## 2019-03-29 DIAGNOSIS — I480 Paroxysmal atrial fibrillation: Secondary | ICD-10-CM | POA: Diagnosis not present

## 2019-03-29 DIAGNOSIS — R58 Hemorrhage, not elsewhere classified: Secondary | ICD-10-CM | POA: Diagnosis not present

## 2019-03-29 DIAGNOSIS — E46 Unspecified protein-calorie malnutrition: Secondary | ICD-10-CM | POA: Diagnosis not present

## 2019-03-29 DIAGNOSIS — T879 Unspecified complications of amputation stump: Secondary | ICD-10-CM | POA: Diagnosis not present

## 2019-03-29 LAB — CBC
HCT: 28.1 % — ABNORMAL LOW (ref 36.0–46.0)
Hemoglobin: 8.6 g/dL — ABNORMAL LOW (ref 12.0–15.0)
MCH: 30.3 pg (ref 26.0–34.0)
MCHC: 30.6 g/dL (ref 30.0–36.0)
MCV: 98.9 fL (ref 80.0–100.0)
Platelets: 216 10*3/uL (ref 150–400)
RBC: 2.84 MIL/uL — ABNORMAL LOW (ref 3.87–5.11)
RDW: 16.3 % — ABNORMAL HIGH (ref 11.5–15.5)
WBC: 7.5 10*3/uL (ref 4.0–10.5)
nRBC: 0 % (ref 0.0–0.2)

## 2019-03-29 LAB — PROTIME-INR
INR: 1.2 (ref 0.8–1.2)
Prothrombin Time: 14.8 seconds (ref 11.4–15.2)

## 2019-03-29 MED ORDER — CEFTRIAXONE IV (FOR PTA / DISCHARGE USE ONLY): 2.0000 g | INTRAVENOUS | 0 refills | Status: DC

## 2019-03-29 MED ORDER — FERROUS SULFATE 300 (60 FE) MG/5ML PO SYRP: 300.0000 mg | ORAL_SOLUTION | Freq: Two times a day (BID) | ORAL | 3 refills | Status: DC

## 2019-03-29 MED ORDER — AMIODARONE HCL 200 MG PO TABS: 200.0000 mg | ORAL_TABLET | Freq: Every day | ORAL | Status: DC

## 2019-03-29 MED ORDER — TRAMADOL HCL 50 MG PO TABS: 50.0000 mg | ORAL_TABLET | Freq: Four times a day (QID) | ORAL | 0 refills | Status: DC | PRN

## 2019-03-29 MED ORDER — WARFARIN SODIUM 5 MG PO TABS: ORAL_TABLET | ORAL | 0 refills | Status: DC

## 2019-03-29 MED ORDER — DILTIAZEM HCL ER COATED BEADS 120 MG PO CP24: 120.0000 mg | ORAL_CAPSULE | Freq: Every day | ORAL | 0 refills | Status: DC

## 2019-03-29 MED ORDER — METOPROLOL TARTRATE 50 MG PO TABS: 50.0000 mg | ORAL_TABLET | Freq: Two times a day (BID) | ORAL | Status: DC

## 2019-03-29 MED ORDER — VITAMIN D (ERGOCALCIFEROL) 1.25 MG (50000 UNIT) PO CAPS: 50000.0000 [IU] | ORAL_CAPSULE | ORAL | Status: DC

## 2019-03-29 MED ORDER — LOVASTATIN 20 MG PO TABS: 20.0000 mg | ORAL_TABLET | Freq: Every day | ORAL | 0 refills | Status: DC

## 2019-03-29 MED ORDER — PANTOPRAZOLE SODIUM 40 MG PO TBEC: 40.0000 mg | DELAYED_RELEASE_TABLET | Freq: Every day | ORAL | Status: DC

## 2019-03-29 MED ORDER — GABAPENTIN 300 MG PO CAPS: 300.0000 mg | ORAL_CAPSULE | Freq: Three times a day (TID) | ORAL | 0 refills | Status: DC

## 2019-03-29 MED ORDER — SENNOSIDES-DOCUSATE SODIUM 8.6-50 MG PO TABS: 2.0000 | ORAL_TABLET | Freq: Two times a day (BID) | ORAL | Status: DC

## 2019-03-29 NOTE — Progress Notes (Signed)
Patient discharged to [place] care. Report called in to [facility] [name]. Left unit via [facility].

## 2019-03-29 NOTE — Progress Notes (Signed)
Dressing clean dry and intact. Reviewed notes of dressing change this am.  Difficult problem given patients poor healing. Continue with antibiotics. [name] can revaluate next week when he returns to town

## 2019-03-29 NOTE — Plan of Care (Signed)
  Problem: Pain Managment: Goal: General experience of comfort will improve Outcome: Progressing   

## 2019-03-29 NOTE — Plan of Care (Signed)
  Problem: Health Behavior/Discharge Planning: Goal: Ability to manage health-related needs will improve Outcome: Progressing   Problem: Activity: Goal: Risk for activity intolerance will decrease Outcome: Progressing   Problem: Pain Managment: Goal: General experience of comfort will improve Outcome: Progressing   Problem: Skin Integrity: Goal: Risk for impaired skin integrity will decrease Outcome: Progressing   

## 2019-03-29 NOTE — Discharge Summary (Signed)
Discharge Summary  Robin Snow:[id number] DOB: 1940/07/16  PCP: [name], MD  Admit date: 02/22/2019 Discharge date: 03/29/2019   Time spent: > 35 minutes  Admitted From: Home Disposition: SNF  Recommendations for Outpatient Follow-up:  1. Follow up with PCP in 1 week 2. HOLD on taking Coumadin until wound evaluation by [name] in 1 week 3. Follow-up in 1 week post discharge and Dr. Jess Barters office for wound evaluation 4. Continue dry dressings to right AKA site 5. Repeat CBC in 1 week 6. Continue IV ceftriaxone until 04/11/2018, ID follow-up as outpatient on1/6 7. Provided short prescription for tramadol 50 mg every 6 hours moderate pain.    Discharge Diagnoses:  Active Hospital Problems   Diagnosis Date Noted  . Bacteremia due to Escherichia coli 02/23/2019  . Acute respiratory failure ([place])   . Pressure injury of skin 03/01/2019  . Dislocation of right knee   . Osteomyelitis of right knee region [facility])   . Severe protein-calorie malnutrition ([place])   . Abscess of thigh   . Chronic osteomyelitis of right femur with draining sinus ([place])   . Infection of total right knee replacement ([employer])   . Iron deficiency anemia due to chronic blood loss 02/23/2019  . Closed dislocation of right knee 02/22/2019  . Effusion of right knee joint 02/22/2019  . AF (paroxysmal atrial fibrillation) ([place]) 02/22/2019  . Sepsis ([place]) 02/02/2019  . Supratherapeutic INR 09/08/2016  . Sinus pause: 4.02-4.80sec per telemetry 09/08/2016 09/08/2016  . Anemia 05/01/2016  . Diabetes mellitus with complication [facility])     Resolved Hospital Problems  No resolved problems to display.    Discharge Condition: Stable  CODE STATUS: Full code Diet recommendation: Renal  Vitals:   03/29/19 0818 03/29/19 0950  BP: 125/64   Pulse: (!) 59 61  Resp: 17   Temp:    SpO2: 96%     History of present illness:  Robin Snow is a 78 y.o. year old female with medical history  significant for paroxysmal atrial fibrillationon anticoagulation Coumadin, hypertension, diabetes mellitustype II,andchronic kidney disease stage III, recent hospitalization from 10/29-12/4 for bacterial arthritis of the right knee complicated by E. coli bacteremia and UTI treated with Rocephin/Ancef and completed 10-day course of Keflex as outpatient with initial improvement before she presented on 02/22/2019 with right knee pain and swelling, blister development and inability to bear weight on right knee and was found to have recurrent E.coli bacteremia complicating total knee prosthesis with extension up to her R hip now s/p AKA on 11/25 and now endocarditis. Remaining hospital course addressed in problem based format below:   Hospital Course:   Sepsis secondary to endocarditis, E. coli bacteremia with infected prosthetic right knee, resolved. Initially found to have prosthetic dislocation abscess based off x-ray imaging.  Patient underwent drainage by Dr. Doreatha Martin on 12/18.  Course was complicated by E. coli bacteremia (blood cultures from 02/22/2019).  TEE (03/08/2019) revealed aortic vegetation, no vegetations on pacer wires.  Repeat blood cultures 11/19, 11/20 are unremarkable -ID did not advise removal of pacemaker and given no vegetations on monitors. Sepsis physiology resolved and remained afebrile on ceftriaxone  -Continue ceftriaxone for total of 6 weeks via PICC, end date 04/12/2019, follow-up with ID as outpatient -Outpatient antibiotic therapy per ID  Right AKA with wound dehiscence Underwent right AKA on 11/25 with wound vac until d/c'don 12/8. [name] evaluated on 12/18, significant for poor wound healing with ischemic wound edges/gangrenous changes along the edges of the  wound. -[name] does not think a revision of the surgical incision will make a difference in her wound healing -Discussed with PA on discharge [name] will follow as outpatient within 1 week -Continue dry  dressing -Nutrition is very poor with albumin of less than 2 nutritional status needs to be greatly improved 10 ensure adequate wound healing  Acute blood loss anemia s/p orthopedic surgery, iron deficiency anemia, resolved Required blood transfusion (6 units total) for hemoglobin nadir 5.9 during admission while on heparin for anticoagulation for paroxysmal atrial fibrillation.  Received Feraheme during hospital stay.  On discharge hemoglobin 8.6 with no active signs or symptoms of bleeding -Heparin and Coumadin have been discontinued -Continue supplemental iron  Postoperative respiratory failure requiring mechanical ventilation, resolved Intubated 11/25, extubated 12/3. Maintaining normal oxygen saturation on room air on discharge  Atrial flutter/sick sinus syndrome status post pacemaker, rhythm and rate controlled Briefly required amiodarone drip during hospital stay.  Home anticoagulation of Coumadin was discontinued given postoperative anemia -Continue Amiodarone 200 mg every day, diltiazem 120 mg daily, metoprolol 50 mg twice daily, -Advise continue to hold anticoagulation given fairly significant postoperative bleeding requiring multiple transfusions (last transfusion 12/17) and Feraheme - follow hemoglobin until wound is reassessed by surgery within a week and consider restarting home coumadin at that time  Protein calorie malnutrition Obesity unspecified BMI 35, albumin 1.8 (12/7) adequate nutrition will be vital to ensure better wound healing for right AKA -Continue nutritional supplementations (MVI daily, Ensure Enlive po TID, Magic cup TID with meals  CKD stage III Baseline creatinine 0.9-1.1. -Avoid nephrotoxins  Severe deconditioning/debility Patient needs extensive PT/OT -Discharging to skilled nursing with already  GERD -Continue Protonix   Consultations:  ID, Ortho, PCCM, cardiology  Procedures/Studies: 11/18 close reduction of right knee prosthetic,  drainage Labs/25 right AKA Left/25 intubated, 12/3 extubated  Discharge Exam: BP 125/64 (BP Location: Left Arm)   Pulse 61   Temp 97.6 F (36.4 C) (Oral)   Resp 17   Ht 5' 7" (1.702 m)   Wt 102.7 kg   SpO2 96%   BMI 35.46 kg/m   General: Obese elderly female, Lying in bed, no apparent distress Eyes: EOMI, anicteric ENT: Oral Mucosa clear and moist Cardiovascular: regular rate and rhythm, no murmurs, rubs or gallops, no edema, Respiratory: Normal respiratory effort on room air, lungs clear to auscultation bilaterally Abdomen: soft, non-distended, non-tender, normal bowel sounds Skin: No Rash MSK: Right AKA with dry dressing in place    Neurologic: Grossly no focal neuro deficit.Mental status AAOx3, speech normal, Psychiatric:Appropriate affect, and mood   Discharge Instructions You were cared for by a hospitalist during your hospital stay. If you have any questions about your discharge medications or the care you received while you were in the hospital after you are discharged, you can call the unit and asked to speak with the hospitalist on call if the hospitalist that took care of you is not available. Once you are discharged, your primary care physician will handle any further medical issues. Please note that NO REFILLS for any discharge medications will be authorized once you are discharged, as it is imperative that you return to your primary care physician (or establish a relationship with a primary care physician if you do not have one) for your aftercare needs so that they can reassess your need for medications and monitor your lab values.  Discharge Instructions    Diet - low sodium heart healthy   Complete by: As directed  Home infusion instructions [facility] May follow [place] Dosing Protocol; May administer Cathflo as needed to maintain patency of vascular access device.; Flushing of vascular access device: per [facility] Protocol: 0.9% NaCl pre/post medica...    Complete by: As directed    Instructions: May follow [place] Dosing Protocol   Instructions: May administer Cathflo as needed to maintain patency of vascular access device.   Instructions: Flushing of vascular access device: per [facility] Protocol: 0.9% NaCl pre/post medication administration and prn patency; Heparin 100 u/ml, 63m for implanted ports and Heparin 10u/ml, 520mfor all other central venous catheters.   Instructions: May follow [facility] Anaphylaxis Protocol for First Dose Administration in the home: 0.9% NaCl at 25-50 ml/hr to maintain IV access for protocol meds. Epinephrine 0.3 ml IV/IM PRN and Benadryl 25-50 IV/IM PRN s/s of anaphylaxis.   Instructions: Ad[facility]fusion Coordinator (RN) to assist per patient IV care needs in the home PRN.   Increase activity slowly   Complete by: As directed      Allergies as of 03/29/2019      Reactions   Aspirin Hives, Swelling   Angioedema   Rofecoxib Hives   Hydrocodone Hives   Tolerates oxycodone and tramadol      Medication List    STOP taking these medications   ferrous sulfate 325 (65 FE) MG EC tablet Replaced by: ferrous sulfate 300 (60 Fe) MG/5ML syrup   ibuprofen 200 MG tablet Commonly known as: ADVIL     TAKE these medications   amiodarone 200 MG tablet Commonly known as: PACERONE Take 1 tablet (200 mg total) by mouth daily. Start taking on: March 30, 2019   cefTRIAXone  IVPB Commonly known as: ROCEPHIN Inject 2 g into the vein daily. Indication:  E Coli Endocarditis Last Day of Therapy:  04/12/2019 Labs - Once weekly:  CBC/D and BMP, Labs - Every other week:  ESR and CRP   diltiazem 120 MG 24 hr capsule Commonly known as: CARDIZEM CD Take 1 capsule (120 mg total) by mouth daily.   ferrous sulfate 300 (60 Fe) MG/5ML syrup Take 5 mLs (300 mg total) by mouth 2 (two) times daily with a meal. Replaces: ferrous sulfate 325 (65 FE) MG EC tablet   gabapentin 300 MG capsule Commonly known as:  NEURONTIN Take 1 capsule (300 mg total) by mouth 3 (three) times daily. Need appointment before next refill   lovastatin 20 MG tablet Commonly known as: MEVACOR Take 1 tablet (20 mg total) by mouth at bedtime. Need appointment for further refills   metoprolol tartrate 50 MG tablet Commonly known as: LOPRESSOR Take 1 tablet (50 mg total) by mouth 2 (two) times daily. What changed:   medication strength  how much to take   pantoprazole 40 MG tablet Commonly known as: PROTONIX Take 1 tablet (40 mg total) by mouth daily. Start taking on: March 30, 2019   senna-docusate 8.6-50 MG tablet Commonly known as: Senokot-S Take 2 tablets by mouth 2 (two) times daily.   traMADol 50 MG tablet Commonly known as: ULTRAM Take 1-2 tablets (50-100 mg total) by mouth every 6 (six) hours as needed for moderate pain.   Vitamin D (Ergocalciferol) 1.25 MG (50000 UT) Caps capsule Commonly known as: DRISDOL Take 1 capsule (50,000 Units total) by mouth every 7 (seven) days. Start taking on: March 30, 2019   warfarin 5 MG tablet Commonly known as: COUMADIN Take as directed. If you are unsure how to take this medication, talk  to your nurse or doctor. Original instructions: HOLD AND DO NOT TAKE UNTIL WOUND EVALUATED BY DR DUDA  TAKE 1 TABLET BY MOUTH DAILY EXCEPT 1 & 1/2 TABLETS ON WEDNESDAY OR AS DIRECTED BY ANTICOAGULATION CLINIC What changed: additional instructions            Home Infusion Instuctions  (From admission, onward)         Start     Ordered   03/29/19 0000  Home infusion instructions [facility] May follow [place] Dosing Protocol; May administer Cathflo as needed to maintain patency of vascular access device.; Flushing of vascular access device: per [facility] Protocol: 0.9% NaCl pre/post medica...    Question Answer Comment  Instructions May follow [place] Dosing Protocol   Instructions May administer Cathflo as needed to maintain patency of vascular access  device.   Instructions Flushing of vascular access device: per [facility] Protocol: 0.9% NaCl pre/post medication administration and prn patency; Heparin 100 u/ml, 60m for implanted ports and Heparin 10u/ml, 557mfor all other central venous catheters.   Instructions May follow [facility] Anaphylaxis Protocol for First Dose Administration in the home: 0.9% NaCl at 25-50 ml/hr to maintain IV access for protocol meds. Epinephrine 0.3 ml IV/IM PRN and Benadryl 25-50 IV/IM PRN s/s of anaphylaxis.   Instructions [facility] Infusion Coordinator (RN) to assist per patient IV care needs in the home PRN.      03/29/19 1443         Allergies  Allergen Reactions  . Aspirin Hives and Swelling    Angioedema   . Rofecoxib Hives  . Hydrocodone Hives    Tolerates oxycodone and tramadol    Contact information for follow-up providers    DuNewt MinionMD In 1 week.   Specialty: Orthopedic Surgery Contact information: [phone]        PoJanine OresMD Follow up.   Specialty: Infectious Diseases Why: 04/12/19 at 9:30am.  Contact information: [address] [phone]          Contact information for after-discharge care    Destination    [facility] Preferred SNF .   Service: Skilled Nursing Contact information: [id number] [phone]                 The results of significant diagnostics from this hospitalization (including imaging, microbiology, ancillary and laboratory) are listed below for reference.    Significant Diagnostic Studies: CT ABDOMEN PELVIS WO CONTRAST  Result Date: 03/03/2019 CLINICAL DATA:  New onset diffuse abdominal distention. Postop from right lower extremity amputation. Possible retroperitoneal hemorrhage. EXAM: CT ABDOMEN AND PELVIS WITHOUT CONTRAST TECHNIQUE: Multidetector CT imaging of the abdomen and pelvis was  performed following the standard protocol without IV contrast. COMPARISON:  04/23/2016 FINDINGS: Lower chest: Mild dependent bibasilar atelectasis. Hepatobiliary: No mass visualized on this unenhanced exam. Prior cholecystectomy. No evidence of biliary obstruction. Pancreas: No mass or inflammatory process visualized on this unenhanced exam. Spleen:  Within normal limits in size. Adrenals/Urinary tract: Moderate diffuse left renal parenchymal atrophy. No evidence of renal calculi or hydronephrosis. Unremarkable unopacified urinary bladder. Stomach/Bowel: Nasogastric tube is seen in place with tip in the distal stomach. No evidence of obstruction, inflammatory process, or abnormal fluid collections. Mild gaseous distention of the colon is seen, suspicious for mild colonic ileus. Vascular/Lymphatic: No evidence of retroperitoneal hemorrhage. No pathologically enlarged lymph  nodes identified. Aortic atherosclerosis. No evidence of abdominal aortic aneurysm. Reproductive:  No mass or other significant abnormality. Other:  Moderate diffuse body wall edema. Musculoskeletal:  No suspicious bone lesions identified. IMPRESSION: 1. No evidence of retroperitoneal hemorrhage. 2. Mild colonic ileus.  No evidence of bowel obstruction. 3. Moderate diffuse body wall edema. 4. Moderate left renal parenchymal atrophy. Electronically Signed   By: Marlaine Hind M.D.   On: 03/03/2019 08:43   DG Abd 1 View  Result Date: 03/02/2019 CLINICAL DATA:  Nasogastric tube placement. EXAM: ABDOMEN - 1 VIEW COMPARISON:  Chest x-ray same day. FINDINGS: Nasogastric tube is present coiled once over the stomach as tip is just right of midline likely over the distal stomach in the right upper quadrant. Air is present throughout the colon. There are a few air-filled mildly prominent small bowel loops in the left mid to upper abdomen. No free peritoneal air. Remainder the exam is unchanged. IMPRESSION: 1. Nonspecific, nonobstructive bowel gas pattern  with a few air-filled mildly prominent small bowel loops in the left mid to upper abdomen. 2. Nasogastric tube coiled once over the stomach with tip just right of midline in the upper abdomen likely over the distal stomach. Electronically Signed   By: Marin Olp M.D.   On: 03/02/2019 10:37   DG CHEST PORT 1 VIEW  Result Date: 03/09/2019 CLINICAL DATA:  78 year old female status post PICC placement. EXAM: PORTABLE CHEST 1 VIEW COMPARISON:  Chest radiograph dated 03/08/2019. FINDINGS: Evaluation is limited due to patient's rotation. There has been interval removal of the enteric and endotracheal tubes. Right IJ central venous line with tip over the spine likely close to the cavoatrial junction. There has been interval placement of a right-sided PICC with tip likely over the central SVC. Small bilateral pleural effusion and associated bibasilar atelectasis. Pneumonia is not excluded. There is cardiomegaly with vascular congestion. No pneumothorax. Left pectoral pacemaker device. No acute osseous pathology. IMPRESSION: 1. Interval placement of a right-sided PICC with tip over the central SVC. No pneumothorax. 2. Small bilateral pleural effusions and associated bibasilar atelectasis. Pneumonia is not excluded. 3. Cardiomegaly with vascular congestion. Electronically Signed   By: Anner Crete M.D.   On: 03/09/2019 17:57   DG Chest Port 1 View  Result Date: 03/08/2019 CLINICAL DATA:  Endotracheal tube in place. Sepsis. EXAM: PORTABLE CHEST 1 VIEW COMPARISON:  One-view chest x-ray 03/07/2019 FINDINGS: Endotracheal tube is stable, 3.8 cm above the carina. Heart is enlarged. Mild bibasilar edema is stable. Bilateral pleural effusions are noted. Bibasilar airspace disease is stable. Pacing wires are unchanged. Spinal cord stimulator is in place. IMPRESSION: 1. Stable cardiomegaly and mild edema. 2. Stable bibasilar airspace disease likely reflects atelectasis. Infection is not excluded. 3. Stable small bilateral  pleural effusions. Electronically Signed   By: San Morelle M.D.   On: 03/08/2019 07:09   DG Chest Port 1 View  Result Date: 03/07/2019 CLINICAL DATA:  Sepsis. EXAM: PORTABLE CHEST 1 VIEW COMPARISON:  Chest x-ray 03/04/2019 FINDINGS: The endotracheal tube is 3.8 cm above the carina. The right IJ catheter is stable. The NG tube is stable. The pacer wires are stable. Stable cardiac enlargement and tortuosity and calcification of the thoracic aorta. Stable cardiac enlargement, central vascular congestion but no overt pulmonary edema. Possible small bilateral pleural effusions and stable bibasilar atelectasis. IMPRESSION: 1. Stable support apparatus. 2. Central vascular congestion without overt pulmonary edema. 3. Possible small pleural effusions and persistent bibasilar atelectasis. Electronically Signed   By: Marijo Sanes  M.D.   On: 03/07/2019 07:44   DG Chest Port 1 View  Result Date: 03/04/2019 CLINICAL DATA:  Update status. EXAM: PORTABLE CHEST 1 VIEW COMPARISON:  Chest x-rays dated 03/02/2019 and 03/01/2019. FINDINGS: Stable cardiomegaly. LEFT chest wall pacemaker/ICD apparatus appears stable. Endotracheal tube is well positioned with tip just above the level of the carina. RIGHT IJ central line appears well positioned with tip overlying the RIGHT atrium. Enteric tube passes below the diaphragm. Given patient rotation, lungs are clear. No pleural effusion or pneumothorax is seen. IMPRESSION: 1. Support apparatus appears appropriately positioned. 2. No evidence of pneumonia or pulmonary edema. Electronically Signed   By: Franki Cabot M.D.   On: 03/04/2019 08:28   DG Chest Portable 1 View  Result Date: 03/02/2019 CLINICAL DATA:  Endotracheal tube assessment. EXAM: PORTABLE CHEST 1 VIEW COMPARISON:  Earlier same day and 03/01/2019. FINDINGS: Patient slightly rotated to the left. Endotracheal tube is better visualized with tip 3 cm above the carina. Interval placement of enteric tube coiled  once over the stomach and coursing over the distal stomach towards the midline as tip is not visualized. Right IJ central venous catheter unchanged with tip over the upper right atrium. Lungs are hypoinflated and otherwise clear. Cardiomediastinal silhouette and remainder of the exam is unchanged. IMPRESSION: 1.  Hypoinflation without acute cardiopulmonary disease. 2. Tubes and lines as described. Note that the endotracheal tube is in adequate position with tip 3 cm above the carina. Right IJ central venous catheter remains unchanged with tip over the upper right atrium. Electronically Signed   By: Marin Olp M.D.   On: 03/02/2019 10:33   Portable Chest xray  Result Date: 03/02/2019 CLINICAL DATA:  Endotracheal tube placement. EXAM: PORTABLE CHEST 1 VIEW COMPARISON:  03/01/2019 FINDINGS: Dual lead left-sided pacemaker unchanged. Right IJ central venous sheath with tip over the SVC. Endotracheal tube tip is somewhat difficult to identify due to overlapping lines as it may be within the proximal right lower lobar bronchus or approximately 2.5 cm above the carina. Lungs are somewhat hypoinflated but otherwise clear. Mild stable cardiomegaly. Remainder of the exam is unchanged. IMPRESSION: 1. Hypoinflation without acute cardiopulmonary disease. Mild stable cardiomegaly. 2. Tubes and lines as described. Note that the endotracheal tube tip is difficult to accurately identify as it may be within the proximal right lower lobar bronchus versus 2.5 cm above the carina. Would recommend repeat chest x-ray to confirm position. These results were called by telephone at the time of interpretation on 03/02/2019 at 9:17 am to patient's nurse, Alferd Patee, who verbally acknowledged these results. Electronically Signed   By: Marin Olp M.D.   On: 03/02/2019 09:18   DG CHEST PORT 1 VIEW  Result Date: 03/01/2019 CLINICAL DATA:  Central line placement. EXAM: PORTABLE CHEST 1 VIEW COMPARISON:  02/02/2019 FINDINGS: The  endotracheal tube is approximately 2 cm above the carina. Central venous catheter tip is in the upper right atrium, 5.5 cm below the carina and could be slightly retracted. Pacemaker in place. Thoracic spinal cord stimulator in place. Heart size and pulmonary vascularity. Lungs are clear. No pneumothorax. Thoracolumbar scoliosis. No acute bone abnormality. IMPRESSION: 1. Central venous catheter tip is in the upper right atrium, 5.5 cm below the carina and could be slightly retracted. 2. No pneumothorax. 3. No acute abnormalities. 4.  Aortic Atherosclerosis (ICD10-I70.0). Electronically Signed   By: Lorriane Shire M.D.   On: 03/01/2019 17:35   ECHO TEE  Result Date: 03/08/2019   TRANSESOPHOGEAL ECHO REPORT  Patient Name:   Robin Snow Date of Exam: 03/08/2019 Medical Rec #:  [id number]        Height:       67.0 in Accession #:    [id number]       Weight:       234.6 lb Date of Birth:  [birth year]        BSA:          2.16 m Patient Age:    10 years         BP:           141/100 mmHg Patient Gender: F                HR:           112 bpm. Exam Location:  Inpatient  Procedure: Transesophageal Echo Indications:     Bacteremia 790.7/R78.81  History:         Patient has prior history of Echocardiogram examinations, most                  recent 05/05/2018. CKD.  Sonographer:     Clayton Lefort RDCS (AE) Referring Phys:  [id number] Kipp Brood Diagnosing Phys: Lyman Bishop MD  PROCEDURE: The patient was intubated. The transesophogeal probe was passed through the esophogus of the patient. Imaged were obtained with the patient in a supine position. Image quality was good. The patient's vital signs; including heart rate, blood pressure, and oxygen saturation; remained stable throughout the procedure. The patient developed no complications during the procedure. IMPRESSIONS  1. Left ventricular ejection fraction, by visual estimation, is 60 to 65%. The left ventricle has normal function. There is mildly increased left  ventricular hypertrophy.  2. Global right ventricle has normal systolic function.The right ventricular size is normal. No increase in right ventricular wall thickness.  3. Left atrial size was severely dilated.  4. Right atrial size was mildly dilated.  5. The mitral valve is abnormal. Mild mitral valve regurgitation.  6. The tricuspid valve is grossly normal. Tricuspid valve regurgitation is trivial.  7. Aortic valve vegetation is suspected on the noncoronary cusp. 2D and 3D modes were used.  8. The aortic valve is tricuspid. Aortic valve regurgitation is trivial. Mild aortic valve sclerosis without stenosis.  9. The pulmonic valve was grossly normal. Pulmonic valve regurgitation is not visualized. 10. A pacer wire is visualized. 11. Pacer wires did not demonstrate thrombus. FINDINGS  Left Ventricle: Left ventricular ejection fraction, by visual estimation, is 60 to 65%. The left ventricle has normal function. There is mildly increased left ventricular hypertrophy. Right Ventricle: The right ventricular size is normal. No increase in right ventricular wall thickness. Global RV systolic function is has normal systolic function. Pacer wires did not demonstrate thrombus. Left Atrium: Left atrial size was severely dilated. Right Atrium: Right atrial size was mildly dilated Prominent Eustachian valve. Pericardium: There is no evidence of pericardial effusion. Mitral Valve: The mitral valve is abnormal. There is mild thickening of the mitral valve leaflet(s). Mild mitral valve regurgitation. Tricuspid Valve: The tricuspid valve is grossly normal. Tricuspid valve regurgitation is trivial. Aortic Valve: The aortic valve is tricuspid. Aortic valve regurgitation is trivial. Mild aortic valve sclerosis is present, with no evidence of aortic valve stenosis. A A mobile vegetation is seen on the noncoronary. The AoV vegetation measures 2 mm x 2 mm. Pulmonic Valve: The pulmonic valve was grossly normal. Pulmonic valve  regurgitation is not visualized. Aorta: The aortic root and ascending aorta  are structurally normal, with no evidence of dilitation. Shunts: No atrial level shunt detected by color flow Doppler. Additional Comments: A pacer wire is visualized.  Lyman Bishop MD Electronically signed by Lyman Bishop MD Signature Date/Time: 03/08/2019/9:46:34 PM    Final    [place] EKG SITE RITE  Result Date: 03/08/2019 If Site Rite image not attached, placement could not be confirmed due to current cardiac rhythm.   Microbiology: Recent Results (from the past 240 hour(s))  SARS CORONAVIRUS 2 (TAT 6-24 HRS) Nasopharyngeal Nasopharyngeal Swab     Status: None   Collection Time: 03/28/19  3:00 PM   Specimen: Nasopharyngeal Swab  Result Value Ref Range Status   SARS Coronavirus 2 NEGATIVE NEGATIVE Final    Comment: (NOTE) SARS-CoV-2 target nucleic acids are NOT DETECTED. The SARS-CoV-2 RNA is generally detectable in upper and lower respiratory specimens during the acute phase of infection. Negative results do not preclude SARS-CoV-2 infection, do not rule out co-infections with other pathogens, and should not be used as the sole basis for treatment or other patient management decisions. Negative results must be combined with clinical observations, patient history, and epidemiological information. The expected result is Negative. Fact Sheet for Patients: SugarRoll.be Fact Sheet for Healthcare Providers: https://www.woods-mathews.com/ This test is not yet approved or cleared by the [place] FDA and  has been authorized for detection and/or diagnosis of SARS-CoV-2 by FDA under an Emergency Use Authorization (EUA). This EUA will remain  in effect (meaning this test can be used) for the duration of the COVID-19 declaration under Section 56 4(b)(1) of the Act, 21 U.S.C. section 360bbb-3(b)(1), unless the authorization is terminated or revoked sooner. Performed at [facility], [place] [address] [place], [place] [address]      Labs: Basic Metabolic Panel: Recent Labs  Lab 03/24/19 0350 03/25/19 0357  NA 141 140  K 3.9 3.9  CL 105 105  CO2 27 26  GLUCOSE 101* 97  BUN 21 18  CREATININE 1.05* 1.00  CALCIUM 8.1* 8.0*   Liver Function Tests: Recent Labs  Lab 03/25/19 0357  AST 29  ALT 25  ALKPHOS 92  BILITOT 0.8  PROT 5.3*  ALBUMIN 1.9*   No results for input(s): LIPASE, AMYLASE in the last 168 hours. No results for input(s): AMMONIA in the last 168 hours. CBC: Recent Labs  Lab 03/25/19 0357 03/26/19 0434 03/27/19 0427 03/28/19 0335 03/29/19 0509  WBC 6.5 6.2 6.6 8.8 7.5  HGB 8.4* 8.7* 8.7* 9.1* 8.6*  HCT 27.1* 27.9* 28.0* 29.8* 28.1*  MCV 96.8 97.6 97.2 98.0 98.9  PLT 171 182 175 220 216   Cardiac Enzymes: No results for input(s): CKTOTAL, CKMB, CKMBINDEX, TROPONINI in the last 168 hours. BNP: BNP (last 3 results) No results for input(s): BNP in the last 8760 hours.  ProBNP (last 3 results) No results for input(s): PROBNP in the last 8760 hours.  CBG: No results for input(s): GLUCAP in the last 168 hours.     Signed:  Desiree Hane, MD [facility] 03/29/2019, 2:43 PM

## 2019-03-29 NOTE — Progress Notes (Signed)
Pt's incision to R thigh has very heavy, black odorous drainage. Pt denies any pain during dressing change. Site is bleeding, black & some staples no longer approximated. Obvious signs of poor healing.

## 2019-03-29 NOTE — TOC Transition Note (Signed)
Transition of Care [facility]) - CM/SW Discharge Note   Patient Details  Name: Robin Snow MRN: [id number] Date of Birth: 06/27/1940  [facility] CM/SW Contact:  Atilano Median, LCSW Phone Number: 03/29/2019, 12:39 PM   Clinical Narrative:    Discharged to SNF. Referral coordinated with [facility] [phone]. Patient's daughter Janett Billow  [phone] aware and agreeable to this plan. Number to call report [phone] room 108p given to unit RN Iona Beard. No other needs at this time. Case closed to this CSW.    Final next level of care: Skilled Nursing Facility Barriers to Discharge: Barriers Resolved   Patient Goals and CMS Choice Patient states their goals for this hospitalization and ongoing recovery are:: get better CMS Medicare.gov Compare Post Acute Care list provided to:: Patient Represenative (must comment) Choice offered to / list presented to : Adult Children  Discharge Placement PASRR number recieved: 03/27/19            Patient chooses bed at: [facility] Patient to be transferred to facility by: [place] Name of family member notified: Janett Billow Patient and family notified of of transfer: 03/29/19  Discharge Plan and Services                                     Social Determinants of Health (SDOH) Interventions     Readmission Risk Interventions No flowsheet data found.

## 2019-03-30 DIAGNOSIS — I480 Paroxysmal atrial fibrillation: Secondary | ICD-10-CM | POA: Diagnosis not present

## 2019-03-30 DIAGNOSIS — T879 Unspecified complications of amputation stump: Secondary | ICD-10-CM | POA: Diagnosis not present

## 2019-03-30 DIAGNOSIS — N1831 Chronic kidney disease, stage 3a: Secondary | ICD-10-CM | POA: Diagnosis not present

## 2019-03-30 DIAGNOSIS — I330 Acute and subacute infective endocarditis: Secondary | ICD-10-CM | POA: Diagnosis not present

## 2019-03-30 DIAGNOSIS — R7881 Bacteremia: Secondary | ICD-10-CM | POA: Diagnosis not present

## 2019-03-31 ENCOUNTER — Other Ambulatory Visit: Payer: Self-pay

## 2019-03-31 ENCOUNTER — Emergency Department (HOSPITAL_COMMUNITY): Payer: Medicare HMO

## 2019-03-31 ENCOUNTER — Inpatient Hospital Stay (HOSPITAL_COMMUNITY)
Admission: EM | Admit: 2019-03-31 | Discharge: 2019-04-18 | DRG: 474 | Disposition: A | Discharge status: Skilled Nursing Facility | Payer: Medicare HMO | Source: Skilled Nursing Facility | Attending: Internal Medicine | Admitting: Internal Medicine

## 2019-03-31 ENCOUNTER — Encounter (HOSPITAL_COMMUNITY): Payer: Self-pay

## 2019-03-31 DIAGNOSIS — M96840 Postprocedural hematoma of a musculoskeletal structure following a musculoskeletal system procedure: Secondary | ICD-10-CM | POA: Diagnosis not present

## 2019-03-31 DIAGNOSIS — Z87891 Personal history of nicotine dependence: Secondary | ICD-10-CM | POA: Diagnosis not present

## 2019-03-31 DIAGNOSIS — Z89611 Acquired absence of right leg above knee: Secondary | ICD-10-CM | POA: Diagnosis not present

## 2019-03-31 DIAGNOSIS — T8130XA Disruption of wound, unspecified, initial encounter: Secondary | ICD-10-CM | POA: Diagnosis not present

## 2019-03-31 DIAGNOSIS — R601 Generalized edema: Secondary | ICD-10-CM

## 2019-03-31 DIAGNOSIS — R54 Age-related physical debility: Secondary | ICD-10-CM | POA: Diagnosis present

## 2019-03-31 DIAGNOSIS — T8743 Infection of amputation stump, right lower extremity: Secondary | ICD-10-CM | POA: Diagnosis present

## 2019-03-31 DIAGNOSIS — J810 Acute pulmonary edema: Secondary | ICD-10-CM | POA: Diagnosis not present

## 2019-03-31 DIAGNOSIS — J95821 Acute postprocedural respiratory failure: Secondary | ICD-10-CM | POA: Diagnosis not present

## 2019-03-31 DIAGNOSIS — B9620 Unspecified Escherichia coli [E. coli] as the cause of diseases classified elsewhere: Secondary | ICD-10-CM | POA: Diagnosis not present

## 2019-03-31 DIAGNOSIS — R531 Weakness: Secondary | ICD-10-CM | POA: Diagnosis not present

## 2019-03-31 DIAGNOSIS — L7632 Postprocedural hematoma of skin and subcutaneous tissue following other procedure: Secondary | ICD-10-CM | POA: Diagnosis present

## 2019-03-31 DIAGNOSIS — K219 Gastro-esophageal reflux disease without esophagitis: Secondary | ICD-10-CM | POA: Diagnosis present

## 2019-03-31 DIAGNOSIS — I872 Venous insufficiency (chronic) (peripheral): Secondary | ICD-10-CM | POA: Diagnosis present

## 2019-03-31 DIAGNOSIS — E785 Hyperlipidemia, unspecified: Secondary | ICD-10-CM | POA: Diagnosis present

## 2019-03-31 DIAGNOSIS — E1152 Type 2 diabetes mellitus with diabetic peripheral angiopathy with gangrene: Secondary | ICD-10-CM | POA: Diagnosis not present

## 2019-03-31 DIAGNOSIS — A4151 Sepsis due to Escherichia coli [E. coli]: Secondary | ICD-10-CM | POA: Diagnosis present

## 2019-03-31 DIAGNOSIS — R0602 Shortness of breath: Secondary | ICD-10-CM | POA: Diagnosis not present

## 2019-03-31 DIAGNOSIS — Z20828 Contact with and (suspected) exposure to other viral communicable diseases: Secondary | ICD-10-CM | POA: Diagnosis not present

## 2019-03-31 DIAGNOSIS — T8781 Dehiscence of amputation stump: Principal | ICD-10-CM

## 2019-03-31 DIAGNOSIS — Z888 Allergy status to other drugs, medicaments and biological substances status: Secondary | ICD-10-CM

## 2019-03-31 DIAGNOSIS — J90 Pleural effusion, not elsewhere classified: Secondary | ICD-10-CM

## 2019-03-31 DIAGNOSIS — Y835 Amputation of limb(s) as the cause of abnormal reaction of the patient, or of later complication, without mention of misadventure at the time of the procedure: Secondary | ICD-10-CM | POA: Diagnosis present

## 2019-03-31 DIAGNOSIS — M60004 Infective myositis, unspecified left leg: Secondary | ICD-10-CM | POA: Diagnosis not present

## 2019-03-31 DIAGNOSIS — L02415 Cutaneous abscess of right lower limb: Secondary | ICD-10-CM | POA: Diagnosis not present

## 2019-03-31 DIAGNOSIS — N183 Chronic kidney disease, stage 3 unspecified: Secondary | ICD-10-CM | POA: Diagnosis not present

## 2019-03-31 DIAGNOSIS — I10 Essential (primary) hypertension: Secondary | ICD-10-CM | POA: Diagnosis present

## 2019-03-31 DIAGNOSIS — R509 Fever, unspecified: Secondary | ICD-10-CM | POA: Diagnosis not present

## 2019-03-31 DIAGNOSIS — I129 Hypertensive chronic kidney disease with stage 1 through stage 4 chronic kidney disease, or unspecified chronic kidney disease: Secondary | ICD-10-CM | POA: Diagnosis not present

## 2019-03-31 DIAGNOSIS — G9341 Metabolic encephalopathy: Secondary | ICD-10-CM | POA: Diagnosis not present

## 2019-03-31 DIAGNOSIS — Z96652 Presence of left artificial knee joint: Secondary | ICD-10-CM | POA: Diagnosis present

## 2019-03-31 DIAGNOSIS — Z66 Do not resuscitate: Secondary | ICD-10-CM | POA: Diagnosis not present

## 2019-03-31 DIAGNOSIS — E1122 Type 2 diabetes mellitus with diabetic chronic kidney disease: Secondary | ICD-10-CM | POA: Diagnosis not present

## 2019-03-31 DIAGNOSIS — R578 Other shock: Secondary | ICD-10-CM | POA: Diagnosis not present

## 2019-03-31 DIAGNOSIS — I70261 Atherosclerosis of native arteries of extremities with gangrene, right leg: Secondary | ICD-10-CM | POA: Diagnosis not present

## 2019-03-31 DIAGNOSIS — M60051 Infective myositis, right thigh: Secondary | ICD-10-CM | POA: Diagnosis not present

## 2019-03-31 DIAGNOSIS — Z79899 Other long term (current) drug therapy: Secondary | ICD-10-CM

## 2019-03-31 DIAGNOSIS — Z950 Presence of cardiac pacemaker: Secondary | ICD-10-CM

## 2019-03-31 DIAGNOSIS — Z6837 Body mass index (BMI) 37.0-37.9, adult: Secondary | ICD-10-CM

## 2019-03-31 DIAGNOSIS — L03115 Cellulitis of right lower limb: Secondary | ICD-10-CM | POA: Diagnosis not present

## 2019-03-31 DIAGNOSIS — D696 Thrombocytopenia, unspecified: Secondary | ICD-10-CM | POA: Diagnosis not present

## 2019-03-31 DIAGNOSIS — Z978 Presence of other specified devices: Secondary | ICD-10-CM | POA: Diagnosis not present

## 2019-03-31 DIAGNOSIS — R1031 Right lower quadrant pain: Secondary | ICD-10-CM | POA: Diagnosis not present

## 2019-03-31 DIAGNOSIS — Z823 Family history of stroke: Secondary | ICD-10-CM

## 2019-03-31 DIAGNOSIS — R4182 Altered mental status, unspecified: Secondary | ICD-10-CM | POA: Diagnosis not present

## 2019-03-31 DIAGNOSIS — Z515 Encounter for palliative care: Secondary | ICD-10-CM | POA: Diagnosis not present

## 2019-03-31 DIAGNOSIS — Z885 Allergy status to narcotic agent status: Secondary | ICD-10-CM | POA: Diagnosis not present

## 2019-03-31 DIAGNOSIS — Z886 Allergy status to analgesic agent status: Secondary | ICD-10-CM

## 2019-03-31 DIAGNOSIS — Z20822 Contact with and (suspected) exposure to covid-19: Secondary | ICD-10-CM | POA: Diagnosis present

## 2019-03-31 DIAGNOSIS — M7989 Other specified soft tissue disorders: Secondary | ICD-10-CM | POA: Diagnosis not present

## 2019-03-31 DIAGNOSIS — R6520 Severe sepsis without septic shock: Secondary | ICD-10-CM | POA: Diagnosis not present

## 2019-03-31 DIAGNOSIS — D62 Acute posthemorrhagic anemia: Secondary | ICD-10-CM | POA: Diagnosis not present

## 2019-03-31 DIAGNOSIS — I480 Paroxysmal atrial fibrillation: Secondary | ICD-10-CM | POA: Diagnosis present

## 2019-03-31 DIAGNOSIS — T8142XA Infection following a procedure, deep incisional surgical site, initial encounter: Secondary | ICD-10-CM | POA: Diagnosis not present

## 2019-03-31 DIAGNOSIS — E669 Obesity, unspecified: Secondary | ICD-10-CM | POA: Diagnosis present

## 2019-03-31 DIAGNOSIS — Y839 Surgical procedure, unspecified as the cause of abnormal reaction of the patient, or of later complication, without mention of misadventure at the time of the procedure: Secondary | ICD-10-CM | POA: Diagnosis not present

## 2019-03-31 DIAGNOSIS — R7881 Bacteremia: Secondary | ICD-10-CM | POA: Diagnosis present

## 2019-03-31 DIAGNOSIS — Z89621 Acquired absence of right hip joint: Secondary | ICD-10-CM

## 2019-03-31 DIAGNOSIS — I96 Gangrene, not elsewhere classified: Secondary | ICD-10-CM | POA: Diagnosis not present

## 2019-03-31 DIAGNOSIS — I358 Other nonrheumatic aortic valve disorders: Secondary | ICD-10-CM | POA: Diagnosis present

## 2019-03-31 DIAGNOSIS — A419 Sepsis, unspecified organism: Secondary | ICD-10-CM | POA: Diagnosis not present

## 2019-03-31 DIAGNOSIS — R17 Unspecified jaundice: Secondary | ICD-10-CM | POA: Diagnosis not present

## 2019-03-31 DIAGNOSIS — M869 Osteomyelitis, unspecified: Secondary | ICD-10-CM | POA: Diagnosis present

## 2019-03-31 DIAGNOSIS — R52 Pain, unspecified: Secondary | ICD-10-CM

## 2019-03-31 DIAGNOSIS — Z8673 Personal history of transient ischemic attack (TIA), and cerebral infarction without residual deficits: Secondary | ICD-10-CM | POA: Diagnosis not present

## 2019-03-31 DIAGNOSIS — I4821 Permanent atrial fibrillation: Secondary | ICD-10-CM | POA: Diagnosis not present

## 2019-03-31 DIAGNOSIS — E1169 Type 2 diabetes mellitus with other specified complication: Secondary | ICD-10-CM | POA: Diagnosis present

## 2019-03-31 DIAGNOSIS — G4733 Obstructive sleep apnea (adult) (pediatric): Secondary | ICD-10-CM | POA: Diagnosis present

## 2019-03-31 DIAGNOSIS — E43 Unspecified severe protein-calorie malnutrition: Secondary | ICD-10-CM | POA: Diagnosis present

## 2019-03-31 DIAGNOSIS — I951 Orthostatic hypotension: Secondary | ICD-10-CM | POA: Diagnosis not present

## 2019-03-31 DIAGNOSIS — Z7189 Other specified counseling: Secondary | ICD-10-CM | POA: Diagnosis not present

## 2019-03-31 DIAGNOSIS — Z4659 Encounter for fitting and adjustment of other gastrointestinal appliance and device: Secondary | ICD-10-CM

## 2019-03-31 DIAGNOSIS — Z95828 Presence of other vascular implants and grafts: Secondary | ICD-10-CM

## 2019-03-31 DIAGNOSIS — Z7901 Long term (current) use of anticoagulants: Secondary | ICD-10-CM

## 2019-03-31 DIAGNOSIS — K5730 Diverticulosis of large intestine without perforation or abscess without bleeding: Secondary | ICD-10-CM | POA: Diagnosis not present

## 2019-03-31 DIAGNOSIS — Z4682 Encounter for fitting and adjustment of non-vascular catheter: Secondary | ICD-10-CM | POA: Diagnosis not present

## 2019-03-31 DIAGNOSIS — Z8619 Personal history of other infectious and parasitic diseases: Secondary | ICD-10-CM | POA: Diagnosis not present

## 2019-03-31 LAB — URINALYSIS, ROUTINE W REFLEX MICROSCOPIC
Bilirubin Urine: NEGATIVE
Glucose, UA: NEGATIVE mg/dL
Hgb urine dipstick: NEGATIVE
Ketones, ur: NEGATIVE mg/dL
Leukocytes,Ua: NEGATIVE
Nitrite: NEGATIVE
Protein, ur: NEGATIVE mg/dL
Specific Gravity, Urine: 1.019 (ref 1.005–1.030)
pH: 8 (ref 5.0–8.0)

## 2019-03-31 LAB — PROTIME-INR
INR: 1.2 (ref 0.8–1.2)
Prothrombin Time: 14.6 seconds (ref 11.4–15.2)

## 2019-03-31 LAB — CBC WITH DIFFERENTIAL/PLATELET
Abs Immature Granulocytes: 0.07 10*3/uL (ref 0.00–0.07)
Basophils Absolute: 0 10*3/uL (ref 0.0–0.1)
Basophils Relative: 0 %
Eosinophils Absolute: 0.1 10*3/uL (ref 0.0–0.5)
Eosinophils Relative: 1 %
HCT: 30 % — ABNORMAL LOW (ref 36.0–46.0)
Hemoglobin: 9.3 g/dL — ABNORMAL LOW (ref 12.0–15.0)
Immature Granulocytes: 1 %
Lymphocytes Relative: 36 %
Lymphs Abs: 3.1 10*3/uL (ref 0.7–4.0)
MCH: 30.4 pg (ref 26.0–34.0)
MCHC: 31 g/dL (ref 30.0–36.0)
MCV: 98 fL (ref 80.0–100.0)
Monocytes Absolute: 1.4 10*3/uL — ABNORMAL HIGH (ref 0.1–1.0)
Monocytes Relative: 16 %
Neutro Abs: 3.9 10*3/uL (ref 1.7–7.7)
Neutrophils Relative %: 46 %
Platelets: 220 10*3/uL (ref 150–400)
RBC: 3.06 MIL/uL — ABNORMAL LOW (ref 3.87–5.11)
RDW: 16.1 % — ABNORMAL HIGH (ref 11.5–15.5)
WBC: 8.6 10*3/uL (ref 4.0–10.5)
nRBC: 0 % (ref 0.0–0.2)

## 2019-03-31 LAB — COMPREHENSIVE METABOLIC PANEL
ALT: 22 U/L (ref 0–44)
AST: 27 U/L (ref 15–41)
Albumin: 2.1 g/dL — ABNORMAL LOW (ref 3.5–5.0)
Alkaline Phosphatase: 118 U/L (ref 38–126)
Anion gap: 9 (ref 5–15)
BUN: 13 mg/dL (ref 8–23)
CO2: 25 mmol/L (ref 22–32)
Calcium: 8.3 mg/dL — ABNORMAL LOW (ref 8.9–10.3)
Chloride: 103 mmol/L (ref 98–111)
Creatinine, Ser: 0.8 mg/dL (ref 0.44–1.00)
GFR calc Af Amer: >60 mL/min (ref >60)
GFR calc non Af Amer: >60 mL/min (ref >60)
Glucose, Bld: 102 mg/dL — ABNORMAL HIGH (ref 70–99)
Potassium: 4.2 mmol/L (ref 3.5–5.1)
Sodium: 137 mmol/L (ref 135–145)
Total Bilirubin: 1.3 mg/dL — ABNORMAL HIGH (ref 0.3–1.2)
Total Protein: 5.9 g/dL — ABNORMAL LOW (ref 6.5–8.1)

## 2019-03-31 LAB — SARS CORONAVIRUS 2 (TAT 6-24 HRS): SARS Coronavirus 2: NEGATIVE

## 2019-03-31 LAB — BRAIN NATRIURETIC PEPTIDE: B Natriuretic Peptide: 186.4 pg/mL — ABNORMAL HIGH (ref 0.0–100.0)

## 2019-03-31 LAB — HEMOGLOBIN AND HEMATOCRIT, BLOOD
HCT: 30.2 % — ABNORMAL LOW (ref 36.0–46.0)
Hemoglobin: 9.9 g/dL — ABNORMAL LOW (ref 12.0–15.0)

## 2019-03-31 LAB — LACTIC ACID, PLASMA: Lactic Acid, Venous: 1.3 mmol/L (ref 0.5–1.9)

## 2019-03-31 MED ORDER — ACETAMINOPHEN 650 MG RE SUPP
650.0000 mg | Freq: Four times a day (QID) | RECTAL | Status: DC | PRN
  Administered 2019-04-09: 18:00:00 650 mg via RECTAL
  Filled 2019-03-31: qty 1

## 2019-03-31 MED ORDER — PRAVASTATIN SODIUM 10 MG PO TABS
20.0000 mg | ORAL_TABLET | Freq: Every day | ORAL | Status: DC
  Administered 2019-03-31 – 2019-04-16 (×14): 20 mg via ORAL
  Filled 2019-03-31 (×16): qty 2

## 2019-03-31 MED ORDER — GABAPENTIN 300 MG PO CAPS
300.0000 mg | ORAL_CAPSULE | Freq: Three times a day (TID) | ORAL | Status: DC
  Administered 2019-03-31 – 2019-04-06 (×17): 300 mg via ORAL
  Filled 2019-03-31 (×19): qty 1

## 2019-03-31 MED ORDER — SODIUM CHLORIDE 0.9% FLUSH
10.0000 mL | INTRAVENOUS | Status: DC | PRN
  Administered 2019-04-04: 10 mL
  Administered 2019-04-04: 20 mL

## 2019-03-31 MED ORDER — TRANEXAMIC ACID 1000 MG/10ML IV SOLN
500.0000 mg | Freq: Once | INTRAVENOUS | Status: AC
  Administered 2019-03-31: 500 mg via TOPICAL
  Filled 2019-03-31: qty 10

## 2019-03-31 MED ORDER — DILTIAZEM HCL ER COATED BEADS 120 MG PO CP24
120.0000 mg | ORAL_CAPSULE | Freq: Every day | ORAL | Status: DC
  Administered 2019-03-31 – 2019-04-12 (×11): 120 mg via ORAL
  Filled 2019-03-31 (×15): qty 1

## 2019-03-31 MED ORDER — METOPROLOL TARTRATE 50 MG PO TABS
50.0000 mg | ORAL_TABLET | Freq: Two times a day (BID) | ORAL | Status: DC
  Administered 2019-03-31 – 2019-04-09 (×18): 50 mg via ORAL
  Filled 2019-03-31 (×2): qty 1
  Filled 2019-03-31 (×2): qty 4
  Filled 2019-03-31 (×17): qty 1

## 2019-03-31 MED ORDER — SODIUM CHLORIDE 0.9% FLUSH
10.0000 mL | Freq: Two times a day (BID) | INTRAVENOUS | Status: DC
  Administered 2019-03-31: 20 mL
  Administered 2019-03-31 – 2019-04-05 (×10): 10 mL
  Administered 2019-04-06: 20 mL
  Administered 2019-04-06: 10 mL
  Administered 2019-04-07: 23:00:00 20 mL
  Administered 2019-04-08: 09:00:00 10 mL
  Administered 2019-04-10: 09:00:00 20 mL
  Administered 2019-04-10 – 2019-04-11 (×2): 10 mL
  Administered 2019-04-11 – 2019-04-13 (×4): 20 mL

## 2019-03-31 MED ORDER — IOHEXOL 300 MG/ML  SOLN
100.0000 mL | Freq: Once | INTRAMUSCULAR | Status: AC | PRN
  Administered 2019-03-31: 100 mL via INTRAVENOUS

## 2019-03-31 MED ORDER — FERROUS SULFATE 300 (60 FE) MG/5ML PO SYRP
300.0000 mg | ORAL_SOLUTION | Freq: Two times a day (BID) | ORAL | Status: DC
  Administered 2019-03-31 – 2019-04-17 (×25): 300 mg via ORAL
  Filled 2019-03-31 (×38): qty 5

## 2019-03-31 MED ORDER — ONDANSETRON HCL 4 MG PO TABS: 4.0000 mg | ORAL_TABLET | Freq: Four times a day (QID) | ORAL | Status: DC | PRN

## 2019-03-31 MED ORDER — ACETAMINOPHEN 325 MG PO TABS
650.0000 mg | ORAL_TABLET | Freq: Four times a day (QID) | ORAL | Status: DC | PRN
  Administered 2019-04-02: 650 mg via ORAL
  Filled 2019-03-31: qty 2

## 2019-03-31 MED ORDER — ONDANSETRON HCL 4 MG/2ML IJ SOLN: 4.0000 mg | Freq: Four times a day (QID) | INTRAMUSCULAR | Status: DC | PRN

## 2019-03-31 MED ORDER — TRAMADOL HCL 50 MG PO TABS
50.0000 mg | ORAL_TABLET | Freq: Four times a day (QID) | ORAL | Status: DC | PRN
  Administered 2019-03-31: 50 mg via ORAL
  Administered 2019-04-02 – 2019-04-10 (×5): 100 mg via ORAL
  Filled 2019-03-31 (×2): qty 2
  Filled 2019-03-31: qty 1
  Filled 2019-03-31 (×3): qty 2

## 2019-03-31 MED ORDER — SODIUM CHLORIDE 0.9 % IV SOLN
2.0000 g | INTRAVENOUS | Status: DC
  Administered 2019-04-01 – 2019-04-09 (×9): 2 g via INTRAVENOUS
  Filled 2019-03-31: qty 2
  Filled 2019-03-31 (×2): qty 20
  Filled 2019-03-31: qty 2
  Filled 2019-03-31 (×6): qty 20
  Filled 2019-03-31: qty 2

## 2019-03-31 MED ORDER — SENNOSIDES-DOCUSATE SODIUM 8.6-50 MG PO TABS
2.0000 | ORAL_TABLET | Freq: Two times a day (BID) | ORAL | Status: DC
  Administered 2019-03-31 – 2019-04-17 (×18): 2 via ORAL
  Filled 2019-03-31 (×24): qty 2

## 2019-03-31 MED ORDER — SODIUM CHLORIDE 0.9% FLUSH: 3.0000 mL | Freq: Once | INTRAVENOUS | Status: DC

## 2019-03-31 MED ORDER — CEFTRIAXONE IV (FOR PTA / DISCHARGE USE ONLY): 2.0000 g | INTRAVENOUS | Status: DC

## 2019-03-31 MED ORDER — PANTOPRAZOLE SODIUM 40 MG PO TBEC
40.0000 mg | DELAYED_RELEASE_TABLET | Freq: Every day | ORAL | Status: DC
  Administered 2019-03-31 – 2019-04-06 (×6): 40 mg via ORAL
  Filled 2019-03-31 (×6): qty 1

## 2019-03-31 MED ORDER — SODIUM CHLORIDE 0.9 % IV SOLN: INTRAVENOUS | Status: DC

## 2019-03-31 MED ORDER — CHLORHEXIDINE GLUCONATE CLOTH 2 % EX PADS
6.0000 | MEDICATED_PAD | Freq: Every day | CUTANEOUS | Status: DC
  Administered 2019-03-31 – 2019-04-17 (×15): 6 via TOPICAL

## 2019-03-31 MED ORDER — VANCOMYCIN HCL IN DEXTROSE 1-5 GM/200ML-% IV SOLN
1000.0000 mg | Freq: Once | INTRAVENOUS | Status: AC
  Administered 2019-03-31: 1000 mg via INTRAVENOUS
  Filled 2019-03-31: qty 200

## 2019-03-31 MED ORDER — FUROSEMIDE 10 MG/ML IJ SOLN
40.0000 mg | Freq: Once | INTRAMUSCULAR | Status: AC
  Administered 2019-03-31: 40 mg via INTRAVENOUS
  Filled 2019-03-31: qty 4

## 2019-03-31 MED ORDER — SODIUM CHLORIDE 0.9 % IV SOLN
2.0000 g | Freq: Once | INTRAVENOUS | Status: AC
  Administered 2019-03-31: 2 g via INTRAVENOUS
  Filled 2019-03-31: qty 20

## 2019-03-31 MED ORDER — AMIODARONE HCL 200 MG PO TABS
200.0000 mg | ORAL_TABLET | Freq: Every day | ORAL | Status: DC
  Administered 2019-03-31 – 2019-04-17 (×15): 200 mg via ORAL
  Filled 2019-03-31 (×17): qty 1

## 2019-03-31 NOTE — Progress Notes (Addendum)
I have been asked to evaluate patient for right AKA wound dehiscence.  Her vital signs are stable.  I have reviewed the recent imaging studies.  Her AKA stump is dehisced with a stable hematoma without signs of active bleeding.  There is some surrounding erythema of the skin edges, likely reactive.  I do agree with keeping her on IV abx for now.  She is medically complex and has a poor potential for wound healing.  For now, I recommend continuing abx and compressive dressing and monitor for signs/symptoms of sepsis.  She does not require any urgent surgical intervention at this time.  I will notify [name] about this so that he can provide definitive care.  [name] is aware and will plan for I&D possible revision AKA on Wednesday.  Call with questions.  Azucena Cecil, MD [facility] [phone] 2:13 PM

## 2019-03-31 NOTE — ED Provider Notes (Addendum)
TIME SEEN: 12:47 AM  CHIEF COMPLAINT: Bleeding from wound, fever  HPI: [name] with history of atrial fibrillation, hypertension, diabetes, chronic kidney disease, right total knee arthroplasty in [place] in 2019 who presents to the emergency department with concerns for fever and bleeding from a wound on her right lower extremity.  Patient was admitted to the hospital 02/03/2019 -02/08/2023 for sepsis secondary to UTI and E. coli bacteremia, chronic infection of the right knee, A. fib with RVR.  Patient was then admitted again on 02/22/2019 -03/29/2019 for dislocation of the right knee prosthesis status post closed reduction by Dr. Doreatha Martin on 02/22/2019.  At that time knee was septic appearing with osteomyelitis of the femur with an abscess extending up to the hip.  Patient subsequently underwent right AKA by [name] on 03/01/2019 due to failure of conservative measures.  Hospital course prolonged secondary to sepsis from endocarditis and E. coli bacteremia.  Discharged to [facility] health care on Rocephin for 6 weeks via right upper extremity PICC line with end date of 04/12/2019.   Patient has right AKA with wound dehiscence that is being followed by [name].  Wound VAC discontinued on 03/14/2019.  Recommended holding Coumadin until wound rechecked as outpatient.  Coming from [facility].  Spoke with nurse at skilled nursing facility who states around 7pm they noticed a small amount of bloody drainage from dressing and noticed foul odor from wound.  At 745pm noticed dressing was saturated and at 9pm now oozing blood around dressing.  Temp tonight was 100.7 today at [facility].  Coumadin is on hold still.  Given Tramadol '100mg'$  at 10:30pm.  Patient had not received Rocephin yet at nursing home.  Patient states she is here because of increasing pain in the right lower extremity.  ROS: Level 5 caveat secondary to confusion  PAST MEDICAL  HISTORY/PAST SURGICAL HISTORY:  Past Medical History:  Diagnosis Date  . AKI (acute kidney injury) ([place]) 04/2016  . Anemia   . Arthritis   . Chronic atrial fibrillation ([place])   . Chronic edema   . Chronic venous insufficiency   . CKD (chronic kidney disease), stage III   . Coagulopathy ([place])   . Diabetes mellitus    Type 2  . Diverticulosis   . Dyslipidemia   . GERD (gastroesophageal reflux disease)   . GI bleed 2015   a. lower GI from diverticular source 2015.  [name] Hx of transfusion of packed red blood cells   . Hypertension   . Morbid obesity ([place])   . Obstructive sleep apnea    on CPAP  . Pulmonary hypertension ([place])   . Pyelonephritis 04/2016  . Renal infarct ([place])    a. 04/2016- adm with flank pain, ? pyelo vs renal infarct in setting of recent subtherapeutic INR.  [name] Sinus bradycardia   . Stroke [facility]) 2015    MEDICATIONS:  Prior to Admission medications   Medication Sig Start Date End Date Taking? Authorizing Provider  amiodarone (PACERONE) 200 MG tablet Take 1 tablet (200 mg total) by mouth daily. 03/30/19   Oretha Milch D, MD  cefTRIAXone (ROCEPHIN) IVPB Inject 2 g into the vein daily. Indication:  E Coli Endocarditis Last Day of Therapy:  04/12/2019 Labs - Once weekly:  CBC/D and BMP, Labs - Every other week:  ESR and CRP 03/29/19 04/27/19  Oretha Milch D, MD  diltiazem (CARDIZEM CD) 120 MG 24 hr capsule Take 1 capsule (120 mg total) by mouth daily. 03/29/19  Oretha Milch D, MD  ferrous sulfate 300 (60 Fe) MG/5ML syrup Take 5 mLs (300 mg total) by mouth 2 (two) times daily with a meal. 03/29/19   Oretha Milch D, MD  gabapentin (NEURONTIN) 300 MG capsule Take 1 capsule (300 mg total) by mouth 3 (three) times daily. Need appointment before next refill 03/29/19   Oretha Milch D, MD  lovastatin (MEVACOR) 20 MG tablet Take 1 tablet (20 mg total) by mouth at bedtime. Need appointment for further refills 03/29/19   Oretha Milch D, MD  metoprolol tartrate (LOPRESSOR)  50 MG tablet Take 1 tablet (50 mg total) by mouth 2 (two) times daily. 03/29/19   Desiree Hane, MD  pantoprazole (PROTONIX) 40 MG tablet Take 1 tablet (40 mg total) by mouth daily. 03/30/19   Desiree Hane, MD  senna-docusate (SENOKOT-S) 8.6-50 MG tablet Take 2 tablets by mouth 2 (two) times daily. 03/29/19   Desiree Hane, MD  traMADol (ULTRAM) 50 MG tablet Take 1-2 tablets (50-100 mg total) by mouth every 6 (six) hours as needed for moderate pain. 03/29/19   Desiree Hane, MD  Vitamin D, Ergocalciferol, (DRISDOL) 1.25 MG (50000 UT) CAPS capsule Take 1 capsule (50,000 Units total) by mouth every 7 (seven) days. 03/30/19   Oretha Milch D, MD  warfarin (COUMADIN) 5 MG tablet HOLD AND DO NOT TAKE UNTIL WOUND EVALUATED BY DR DUDA  TAKE 1 TABLET BY MOUTH DAILY EXCEPT 1 & 1/2 TABLETS ON WEDNESDAY OR AS DIRECTED BY ANTICOAGULATION CLINIC 03/29/19   Desiree Hane, MD    ALLERGIES:  Allergies  Allergen Reactions  . Aspirin Hives and Swelling    Angioedema   . Rofecoxib Hives  . Hydrocodone Hives    Tolerates oxycodone and tramadol    SOCIAL HISTORY:  Social History   Tobacco Use  . Smoking status: Former Smoker    Packs/day: 0.12    Years: 34.00    Pack years: 4.08    Types: Cigarettes    Quit date: 07/07/1993    Years since quitting: 25.7  . Smokeless tobacco: Never Used  Substance Use Topics  . Alcohol use: No    Comment: no longer drinks alcohol    FAMILY HISTORY: Family History  Problem Relation Age of Onset  . Cancer Father   . Stroke Maternal Aunt     EXAM: BP (!) 157/62   Pulse 82   Resp 14   SpO2 99%  CONSTITUTIONAL: Alert and oriented person and place but not time.  Has a hard time answering questions appropriately.  Obese.  Chronically ill-appearing. HEAD: Normocephalic EYES: Conjunctivae clear, pupils appear equal, EOM appear intact ENT: normal nose; moist mucous membranes NECK: Supple, normal ROM CARD: RRR; S1 and S2 appreciated; no murmurs,  no clicks, no rubs, no gallops RESP: Normal chest excursion without splinting or tachypnea; breath sounds clear and equal bilaterally; no wheezes, no rhonchi, no rales, no hypoxia or respiratory distress, speaking full sentences ABD/GI: Normal bowel sounds; non-distended; soft, tender with guarding in the right lower quadrant, no rebound, no guarding, no peritoneal signs, no hepatosplenomegaly BACK:  The back appears normal EXT: PICC line in the right upper extremity without surrounding redness, warmth, tenderness, bleeding or drainage.  Patient is status post AKA of the right lower extremity.  Large area of wound dehiscence with some areas oozing blood.  Right lower extremity feels warm to touch compared to the left.  No redness noted to her right lower extremity.  No induration or  sign of abscess. SKIN: Normal color for age and race; warm; no rash on exposed skin NEURO: Moves all extremities equally, normal speech PSYCH: The patient's mood and manner are appropriate.   MEDICAL DECISION MAKING: Patient here with chronic wound dehiscence tonight with active bleeding.  No sign of arterial bleed on exam.  Wound appears hemostatic at this time.  Nursing home staff also noted foul odor from the wound tonight without any signs of purulent drainage and patient had a fever of 100.7.  During her hospitalization it was noted that she had an abscess of the right lower extremity, septic arthritis and osteomyelitis of the femur.  Failed conservative treatment and required surgical intervention with AKA.  Also course complicated with sepsis from bacteremia and endocarditis from E. coli.  Patient is supposed to be on Rocephin daily.  Per nursing home staff, patient has not received any antibiotics since arrival at [facility] health care.  Will give dose of Rocephin 2 g now.  Will check labs, chest x-ray, swab for COVID-19, obtain urine sample.  Patient also exquisitely tender in the right lower abdomen.  Will obtain CT of  the abdomen pelvis.  Anticipate patient will need readmission.  ED PROGRESS: Patient's labs are very reassuring here.  Her hemoglobin is 9.3 which is slightly increased from hemoglobin on discharge.  Her lactate is normal.  INR is 1.2.  Platelets are normal.  Her rectal temperature is 99.  Patient has known dehiscence of the wound to her right lower extremity.  Will apply TXA soaked gauze for any residual bleeding and a prior pressure dressing.  There is no sign of superimposed infection at this time.   2:30 AM  D/w radiologist.  He states on CT imaging patient appears to have bilateral effusions and signs of aspiration versus infection.  Also has bladder wall thickening consistent with since cystitis with appearance of a sending UTI.  Also states significant changes within the muscles of the right thigh that could be consistent with intramuscular hematoma versus abscess.  Recommends obtaining CT of the femur with contrast.  Given normal renal function, patient is okay to receive second IV contrast bolus tonight.  Chest x-ray and Covid swab are pending.  Nurse is aware of need for a catheterized urine specimen.  3:55 AM  Pt's BNP is 186.  CT is concerning for anasarca, pleural effusions and ascites.  Chest x-ray concerning for pulmonary edema.  Will diurese patient.  Urine looks great today without any sign of infection.  She has received Rocephin.  Urine culture pending.  CT of the right lower extremity shows diffuse intramuscular hematoma with probable superimposed infection extending from the amputation site.  She has areas of ulceration at the amputation site with nonloculated fluid that could represent early abscess.  No sign of osteomyelitis.  Given now new infection in the right lower extremity, will admit.  We will continue her Rocephin and also add on IV vancomycin.  I feel orthopedics can be consulted not emergently in the morning.  No sign of active hemorrhage on exam at this time.  Will  repeat H/H.  4:33 AM Discussed patient's case with hospitalist, Dr. Alcario Drought.  I have recommended admission and patient (and family if present) agree with this plan. Admitting physician will place admission orders.   I reviewed all nursing notes, vitals, pertinent previous records and interpreted all EKGs, lab and urine results, imaging (as available).    CRITICAL CARE Performed by: Pryor Curia   Total critical care  time: 65 minutes  Critical care time was exclusive of separately billable procedures and treating other patients.  Critical care was necessary to treat or prevent imminent or life-threatening deterioration.  Critical care was time spent personally by me on the following activities: development of treatment plan with patient and/or surrogate as well as nursing, discussions with consultants, evaluation of patient's response to treatment, examination of patient, obtaining history from patient or surrogate, ordering and performing treatments and interventions, ordering and review of laboratory studies, ordering and review of radiographic studies, pulse oximetry and re-evaluation of patient's condition.   Robin Snow was evaluated in Emergency Department on 03/31/2019 for the symptoms described in the history of present illness. She was evaluated in the context of the global COVID-19 pandemic, which necessitated consideration that the patient might be at risk for infection with the SARS-CoV-2 virus that causes COVID-19. Institutional protocols and algorithms that pertain to the evaluation of patients at risk for COVID-19 are in a state of rapid change based on information released by regulatory bodies including the CDC and federal and state organizations. These policies and algorithms were followed during the patient's care in the ED.  Patient was seen wearing N95, face shield, gloves.               Sarahbeth Cashin, Delice Bison, DO 03/31/19 [phone]

## 2019-03-31 NOTE — Progress Notes (Signed)
   Vital Signs MEWS/VS Documentation      03/31/2019 0500 03/31/2019 0530 03/31/2019 0600 03/31/2019 0813   MEWS Score:  0  1  1  3    MEWS Score Color:  Green  Green  Green  Yellow   Resp:  18  12  13  16    Pulse:  77  69  68  (!) 140   BP:  (!) 144/71  (!) 145/82  140/69  (!) 148/91   Temp:  --  --  --  99 F (37.2 C)      [place]ew Yellow, Pulse 140, BP  148/91; Dr. Doristine Bosworth notified. Cardiac medications given early.     Rona Ravens 03/31/2019,8:56 AM

## 2019-03-31 NOTE — ED Notes (Signed)
ED TO INPATIENT HANDOFF REPORT  ED Nurse Name and Phone #: Lewanda Rife [phone]  S Name/Age/Gender [name] 78 y.o. female Room/Bed: 029C/029C  Code Status   Code Status: Full Code  Home/SNF/Other Nursing Home Patient oriented to: self, place, time and situation Is this baseline? Yes   Triage Complete: Triage complete  Chief Complaint Wound dehiscence [T81.30XA]  Triage Note Pt from [facility] via [facility] C/O increased bleeding and worsening infection of left lower leg post amputation in November.      Allergies Allergies  Allergen Reactions  . Aspirin Hives and Swelling    Angioedema   . Rofecoxib Hives  . Hydrocodone Hives    Tolerates oxycodone and tramadol    Level of Care/Admitting Diagnosis ED Disposition    ED Disposition Condition Comment   Admit  Hospital Area: [facility] [[id number]]  Level of Care: Med-Surg [16]  I expect the patient will be discharged within 24 hours: No (not a candidate for 5C-Observation unit)  Covid Evaluation: Asymptomatic Screening Protocol (No Symptoms)  Diagnosis: Wound dehiscence [536644]  Admitting Physician: Hillary Bow [[id number]]  Attending Physician: Hillary Bow [[id number]]  PT Class (Do Not Modify): Observation [104]  PT Acc Code (Do Not Modify): Observation [10022]       B Medical/Surgery History Past Medical History:  Diagnosis Date  . AKI (acute kidney injury) (HCC) 04/2016  . Anemia   . Arthritis   . Chronic atrial fibrillation (HCC)   . Chronic edema   . Chronic venous insufficiency   . CKD (chronic kidney disease), stage III   . Coagulopathy (HCC)   . Diabetes mellitus    Type 2  . Diverticulosis   . Dyslipidemia   . GERD (gastroesophageal reflux disease)   . GI bleed 2015   a. lower GI from diverticular source 2015.  [name] Hx of transfusion of packed red blood cells   . Hypertension   . Morbid obesity (HCC)   . Obstructive sleep apnea    on CPAP  . Pulmonary hypertension  (HCC)   . Pyelonephritis 04/2016  . Renal infarct (HCC)    a. 04/2016- adm with flank pain, ? pyelo vs renal infarct in setting of recent subtherapeutic INR.  [name] Sinus bradycardia   . Stroke [facility]) 2015   Past Surgical History:  Procedure Laterality Date  . AMPUTATION Right 03/01/2019   Procedure: AMPUTATION ABOVE KNEE;  Surgeon: [name], MD;  Location: [facility] OR;  Service: Orthopedics;  Laterality: Right;  . CESAREAN SECTION    . COLONOSCOPY Left 01/24/2013   Procedure: COLONOSCOPY;  Surgeon: Willis Modena, MD;  Location: [facility] ENDOSCOPY;  Service: Endoscopy;  Laterality: Left;  . ESOPHAGOGASTRODUODENOSCOPY Left 01/23/2013   Procedure: ESOPHAGOGASTRODUODENOSCOPY (EGD);  Surgeon: Willis Modena, MD;  Location: [facility] ENDOSCOPY;  Service: Endoscopy;  Laterality: Left;  . EYE SURGERY Bilateral    cataract surgery  . JOINT REPLACEMENT    . KNEE CLOSED REDUCTION Right 02/22/2019   Procedure: CLOSED MANIPULATION KNEE;  Surgeon: Roby Lofts, MD;  Location: [facility] OR;  Service: Orthopedics;  Laterality: Right;  . PACEMAKER IMPLANT [name] 09/09/2016   Procedure: Pacemaker Implant;  Surgeon: Marinus Maw, MD;  Location: [facility] INVASIVE CV LAB;  Service: Cardiovascular;  Laterality: [name];  . REPLACEMENT TOTAL KNEE BILATERAL    . TUBAL LIGATION       A IV Location/Drains/Wounds Patient Lines/Drains/Airways Status   Active Line/Drains/Airways    Name:   Placement date:   Placement time:  Site:   Days:   PICC Double Lumen 03/09/19 PICC Right Brachial 42 cm 0 cm   03/09/19    1659     22   External Urinary Catheter   03/01/19    --    --   30   Incision (Closed) 09/09/16 Chest Left   09/09/16    1540     933   Incision (Closed) 03/01/19 Thigh   03/01/19    1413     30   Pressure Injury 03/01/19 Buttocks Medial Stage II -  Partial thickness loss of dermis presenting as a shallow open ulcer with a red, pink wound bed without slough.   03/01/19    1732     30   Pressure Injury 03/03/19 Thigh Right Stage II -   Partial thickness loss of dermis presenting as a shallow open ulcer with a red, pink wound bed without slough.   03/03/19    1000     28          Intake/Output Last 24 hours  Intake/Output Summary (Last 24 hours) at 03/31/2019 0501 Last data filed at 03/31/2019 9470 Gross per 24 hour  Intake 160.75 ml  Output --  Net 160.75 ml    Labs/Imaging Results for orders placed or performed during the hospital encounter of 03/31/19 (from the past 48 hour(s))  Type and screen     Status: None (Preliminary result)   Collection Time: 03/31/19 12:35 AM  Result Value Ref Range   ABO/RH(D) A POS    Antibody Screen POS    Sample Expiration 04/03/2019,2359    Antibody Identification ANTI LEA Melvyn Neth a)    Unit Number [id number]    Blood Component Type RED CELLS,LR    Unit division 00    Status of Unit ALLOCATED    Donor AG Type      NEGATIVE FOR E ANTIGEN NEGATIVE FOR LEWIS A ANTIGEN   Transfusion Status OK TO TRANSFUSE    Crossmatch Result COMPATIBLE    Unit Number [id number]    Blood Component Type RED CELLS,LR    Unit division 00    Status of Unit ALLOCATED    Donor AG Type      NEGATIVE FOR E ANTIGEN NEGATIVE FOR LEWIS A ANTIGEN   Transfusion Status OK TO TRANSFUSE    Crossmatch Result COMPATIBLE   Lactic acid, plasma     Status: None   Collection Time: 03/31/19 12:44 AM  Result Value Ref Range   Lactic Acid, Venous 1.3 0.5 - 1.9 mmol/L    Comment: Performed at [facility], [address], [place], [place] [address]  Comprehensive metabolic panel     Status: Abnormal   Collection Time: 03/31/19 12:44 AM  Result Value Ref Range   Sodium 137 135 - 145 mmol/L   Potassium 4.2 3.5 - 5.1 mmol/L   Chloride 103 98 - 111 mmol/L   CO2 25 22 - 32 mmol/L   Glucose, Bld 102 (H) 70 - 99 mg/dL   BUN 13 8 - 23 mg/dL   Creatinine, Ser 0.17 0.44 - 1.00 mg/dL   Calcium 8.3 (L) 8.9 - 10.3 mg/dL   Total Protein 5.9 (L) 6.5 - 8.1 g/dL   Albumin 2.1 (L) 3.5 - 5.0 g/dL   AST 27 15 -  41 U/L   ALT 22 0 - 44 U/L   Alkaline Phosphatase 118 38 - 126 U/L   Total Bilirubin 1.3 (H) 0.3 - 1.2 mg/dL  GFR calc non Af Amer >60 >60 mL/min   GFR calc Af Amer >60 >60 mL/min   Anion gap 9 5 - 15    Comment: Performed at [facility], [address], [place], [place] [address]  CBC with Differential     Status: Abnormal   Collection Time: 03/31/19 12:44 AM  Result Value Ref Range   WBC 8.6 4.0 - 10.5 K/uL   RBC 3.06 (L) 3.87 - 5.11 MIL/uL   Hemoglobin 9.3 (L) 12.0 - 15.0 g/dL   HCT 60.4 (L) 54.0 - 98.1 %   MCV 98.0 80.0 - 100.0 fL   MCH 30.4 26.0 - 34.0 pg   MCHC 31.0 30.0 - 36.0 g/dL   RDW 19.1 (H) 47.8 - 29.5 %   Platelets 220 150 - 400 K/uL   nRBC 0.0 0.0 - 0.2 %   Neutrophils Relative % 46 %   Neutro Abs 3.9 1.7 - 7.7 K/uL   Lymphocytes Relative 36 %   Lymphs Abs 3.1 0.7 - 4.0 K/uL   Monocytes Relative 16 %   Monocytes Absolute 1.4 (H) 0.1 - 1.0 K/uL   Eosinophils Relative 1 %   Eosinophils Absolute 0.1 0.0 - 0.5 K/uL   Basophils Relative 0 %   Basophils Absolute 0.0 0.0 - 0.1 K/uL   Immature Granulocytes 1 %   Abs Immature Granulocytes 0.07 0.00 - 0.07 K/uL    Comment: Performed at [facility], [address] [place], [place] [address]  Protime-INR     Status: None   Collection Time: 03/31/19 12:44 AM  Result Value Ref Range   Prothrombin Time 14.6 11.4 - 15.2 seconds   INR 1.2 0.8 - 1.2    Comment: (NOTE) INR goal varies based on device and disease states. Performed at [facility], [address], [place], [place] [address]   Brain natriuretic peptide     Status: Abnormal   Collection Time: 03/31/19  2:39 AM  Result Value Ref Range   B Natriuretic Peptide 186.4 (H) 0.0 - 100.0 pg/mL    Comment: Performed at [facility], [address], [place], [place] [address]  Urinalysis, Routine w reflex microscopic     Status: None   Collection Time: 03/31/19  2:43 AM  Result Value Ref Range   Color, Urine YELLOW YELLOW   APPearance  CLEAR CLEAR   Specific Gravity, Urine 1.019 1.005 - 1.030   pH 8.0 5.0 - 8.0   Glucose, UA NEGATIVE NEGATIVE mg/dL   Hgb urine dipstick NEGATIVE NEGATIVE   Bilirubin Urine NEGATIVE NEGATIVE   Ketones, ur NEGATIVE NEGATIVE mg/dL   Protein, ur NEGATIVE NEGATIVE mg/dL   Nitrite NEGATIVE NEGATIVE   Leukocytes,Ua NEGATIVE NEGATIVE    Comment: Performed at [facility], [address] [place], [place] [address]   DG Chest 1 View  Result Date: 03/31/2019 CLINICAL DATA:  Infected wound postop EXAM: CHEST  1 VIEW COMPARISON:  March 09, 2019 FINDINGS: There is unchanged cardiomegaly. A left-sided pacemaker seen. Overlying spinal stimulator leads. There is a small left pleural effusion. Hazy patchy airspace opacity at both lung bases. Pulmonary vascular congestion. No acute osseous abnormality. IMPRESSION: Small left pleural effusion with pulmonary vascular congestion. Hazy airspace opacity seen at both lung bases which could be due to edema, atelectasis, and/or infectious etiology. Electronically Signed   By: Jonna Clark M.D.   On: 03/31/2019 02:34   CT ABDOMEN PELVIS W CONTRAST  Result Date: 03/31/2019  CLINICAL DATA:  Right lower quadrant pain increased bleeding and worsening infection of right lower leg post amputation November. EXAM: CT ABDOMEN AND PELVIS WITH CONTRAST TECHNIQUE: Multidetector CT imaging of the abdomen and pelvis was performed using the standard protocol following bolus administration of intravenous contrast. CONTRAST:  152mL OMNIPAQUE IOHEXOL 300 MG/ML  SOLN COMPARISON:  CT 03/03/2019 FINDINGS: Lower chest: Small bilateral effusions. Adjacent passive atelectasis. Some patchy reticulonodular opacities in the bases could reflect atelectatic change though underlying infection or inflammation is not excluded. There is pronounced cardiomegaly with predominantly biatrial enlargement. Pacer wires terminate near the septum and right atrium. Coronary artery calcifications are present.  Hepatobiliary: No focal liver abnormality is seen. Patient is post cholecystectomy. Slight prominence of the biliary tree likely related to reservoir effect. No calcified intraductal gallstones. Pancreas: Atrophic appearance of the pancreas. No peripancreatic inflammation or ductal dilatation. Spleen: Normal in size without focal abnormality. Adrenals/Urinary Tract: Asymmetric left renal atrophy. Extensive cortical scarring of the right kidney. Cortical cyst in the upper pole right kidney is similar to prior. No worrisome renal lesion. There is mild bilateral urothelial thickening and circumferential bladder wall thickening greater than expected for underdistention. No visible calculi or frank hydronephrosis. Stomach/Bowel: Distal esophagus, stomach and duodenal sweep are unremarkable. No small bowel wall thickening or dilatation. No evidence of obstruction. The appendix is not well visualized. A diminutive partially air-filled appendix is seen extending from the cecal tip without focal periappendiceal inflammation. Extensive pancolonic diverticula without focal pericolonic inflammation to suggest diverticulitis. Vascular/Lymphatic: No pathologically enlarged nodes in the abdomen or pelvis. Reproductive: Anteverted uterus. No concerning adnexal lesions. Other: A spinal stimulator pack is seen in the posterior soft tissues of the left flank with leads terminating at the T9 level. Small volume of free fluid seen layering in the deep pelvis, nonspecific given the extensive circumferential body wall edema. Furthermore, body wall edema slightly asymmetric to the right hip, flank and abdominal wall. Overlying skin thickening is present as well which should be assessed for ascites. Musculoskeletal: There is increasing hyperdense expansion of the anterior muscular compartment the right thigh involving predominantly the sartorius, rectus femoris distal iliopsoas musculature as well as new hyperdense expansion within the  medial thigh involving the right operator externus. Marked asymmetric overlying soft tissue swelling is worrisome for possible infection. These changes extend below the level of cross-sectional imaging. Stable lobular simple fluid attenuation cystic structure anterior to the left greater trochanter is suggestive of a bursitis. Multilevel degenerative changes are present in the imaged portions of the spine. Severe S-shaped scoliotic curvature of the spine with associated chest wall deformity and pelvic tilt is similar to comparison studies. Moderate to severe degenerative changes in both hips. IMPRESSION: 1. Increased hyperdense expansion of the anterior muscular compartment the right thigh with new hyperdense expansion within the proximal medial thigh. Features are suggestive intramuscular hemorrhage/hematoma. However, given marked asymmetric overlying soft tissue swelling, underlying infection cannot be fully excluded. 2. Circumferential bladder wall thickening and bilateral ureteral urothelial thickening concerning for a cystitis and possible ascending urinary tract infection. Correlate with urinalysis. 3. Additional features of anasarca with bilateral effusions, severe body wall edema, and trace ascites. 4. Some patchy reticulonodular opacities in the bases could reflect atelectatic change though underlying infection or inflammation is not excluded. 5. Cardiomegaly with predominantly biatrial enlargement. 6. Asymmetric left renal atrophy. Cortical scarring in the right kidney, similar to prior. 7. Aortic Atherosclerosis (ICD10-I70.0). 8. Additional chronic and incidental findings, detailed above. These results were called by telephone at  the time of interpretation on 03/31/2019 at 2:38 am to provider [facility] , who verbally acknowledged these results. Electronically Signed   By: Kreg ShropshirePrice  DeHay M.D.   On: 03/31/2019 02:39   CT FEMUR RIGHT W CONTRAST  Result Date: 03/31/2019 CLINICAL DATA:  Lower extremity  infection versus hematoma, amputation in November EXAM: CT OF THE LOWER RIGHT EXTREMITY WITH CONTRAST TECHNIQUE: Multidetector CT imaging of the lower right extremity was performed according to the standard protocol following intravenous contrast administration. COMPARISON:  CT same day CONTRAST:  100mL OMNIPAQUE IOHEXOL 300 MG/ML  SOLN FINDINGS: Bones/Joint/Cartilage The patient is status post midshaft femoral amputation. No fracture or dislocation is seen. No areas of cortical destruction or periosteal reaction. There is advanced right hip osteoarthritis with joint space loss and marginal osteophyte formation. Diffuse osteopenia seen. Ligaments Suboptimally assessed by CT. Muscles and Tendons There is diffusely heterogeneously enhancing enlargement throughout the muscles surrounding the femur extending the way to the level of the greater trochanter. This extends the overlying amputation site where there focal areas ulceration skin thickening heterogeneous fluid within the soft tissues. Definite loculated fluid collection. Soft tissues Area ulceration seen at the amputation site with diffuse subcutaneous edema skin thickening. No subcutaneous emphysema seen soft tissues. The visualized deep pelvis is grossly. Scattered vascular calcifications noted. IMPRESSION: 1. Findings suggestive of diffuse intramuscular hematoma surrounding the lower extremity with probable superimposed infection extending from the amputation site. 2. Areas of ulceration at the amputation site with non loculated fluid which could represent phlegmon/early abscess. There is diffuse cellulitis surrounding the lower extremity. No definite evidence of osteomyelitis. Electronically Signed   By: Jonna ClarkBindu  Avutu M.D.   On: 03/31/2019 03:42   DG FEMUR, MIN 2 VIEWS RIGHT  Result Date: 03/31/2019 CLINICAL DATA:  Infected, postop EXAM: RIGHT FEMUR 2 VIEWS COMPARISON:  None. FINDINGS: The patient is status post mid femur amputation. There is diffuse soft  tissue swelling. No fracture or dislocation is seen. There is diffuse osteopenia. Dense vascular calcifications. IMPRESSION: Status post amputation.  No definite acute osseous abnormality. Electronically Signed   By: Jonna ClarkBindu  Avutu M.D.   On: 03/31/2019 02:33    Pending Labs Unresulted Labs (From admission, onward)    Start     Ordered   03/31/19 0501  MRSA PCR Screening  ONCE - STAT,   STAT     03/31/19 0500   03/31/19 0435  Hemoglobin and hematocrit, blood  ONCE - STAT,   STAT     03/31/19 0434   03/31/19 0104  SARS CORONAVIRUS 2 (TAT 6-24 HRS) Nasopharyngeal Nasopharyngeal Swab  (Tier 3 (TAT 6-24 hrs))  ONCE - STAT,   STAT    Question Answer Comment  Is this test for diagnosis or screening Screening   Symptomatic for COVID-19 as defined by CDC No   Hospitalized for COVID-19 No   Admitted to ICU for COVID-19 No   Previously tested for COVID-19 Yes   Resident in a congregate (group) care setting Yes   Employed in healthcare setting No   Pregnant No      03/31/19 0103   03/31/19 0050  Urine culture  ONCE - STAT,   STAT     03/31/19 0049   03/31/19 0034  Blood culture (routine x 2)  BLOOD CULTURE X 2,   STAT     03/31/19 0034          Vitals/Pain Today's Vitals   03/31/19 0300 03/31/19 0400 03/31/19 0415 03/31/19 0430  BP: (!) 147/79 [name](!)  143/68  (!) 144/69  Pulse: 76 73 72 74  Resp: Temp:      TempSrc:      SpO2: 92% 94% 92% 91%  PainSc:        Isolation Precautions No active isolations  Medications Medications  sodium chloride flush (NS) 0.9 % injection 3 mL (3 mLs Intravenous Not Given 03/31/19 0439)  vancomycin (VANCOCIN) IVPB 1000 mg/200 mL premix (1,000 mg Intravenous New Bag/Given 03/31/19 0455)  diltiazem (CARDIZEM CD) 24 hr capsule 120 mg (has no administration in time range)  amiodarone (PACERONE) tablet 200 mg (has no administration in time range)  ferrous sulfate 300 (60 Fe) MG/5ML syrup 300 mg (has no administration in time range)  gabapentin  (NEURONTIN) capsule 300 mg (has no administration in time range)  pravastatin (PRAVACHOL) tablet 20 mg (has no administration in time range)  metoprolol tartrate (LOPRESSOR) tablet 50 mg (has no administration in time range)  pantoprazole (PROTONIX) EC tablet 40 mg (has no administration in time range)  traMADol (ULTRAM) tablet 50-100 mg (has no administration in time range)  senna-docusate (Senokot-S) tablet 2 tablet (has no administration in time range)  acetaminophen (TYLENOL) tablet 650 mg (has no administration in time range)    Or  acetaminophen (TYLENOL) suppository 650 mg (has no administration in time range)  ondansetron (ZOFRAN) tablet 4 mg (has no administration in time range)    Or  ondansetron (ZOFRAN) injection 4 mg (has no administration in time range)  cefTRIAXone (ROCEPHIN) 2 g in sodium chloride 0.9 % 100 mL IVPB (has no administration in time range)  cefTRIAXone (ROCEPHIN) 2 g in sodium chloride 0.9 % 100 mL IVPB (0 g Intravenous Stopped 03/31/19 0329)  tranexamic acid (CYKLOKAPRON) injection 500 mg (500 mg Topical Given 03/31/19 0302)  iohexol (OMNIPAQUE) 300 MG/ML solution 100 mL (100 mLs Intravenous Contrast Given 03/31/19 0156)  iohexol (OMNIPAQUE) 300 MG/ML solution 100 mL (100 mLs Intravenous Contrast Given 03/31/19 0259)  furosemide (LASIX) injection 40 mg (40 mg Intravenous Given 03/31/19 0448)    Mobility non-ambulatory High fall risk   Focused Assessments Integumentary   R Recommendations: See Admitting Provider Note  Report given to:   Additional Notes: AKA amputee, E. Coli infection in leg wound on amputated side

## 2019-03-31 NOTE — Plan of Care (Signed)

## 2019-03-31 NOTE — Progress Notes (Addendum)
08:07 - Notified charge nurse, Merrie Roof, and Dr. Doristine Bosworth that patient had BP of 148/91 and HR 140.  Also, notified them that a telemetry box was needed for the patient and that there were none currently available.  Unit secretary to call other unit to see if a box was available.  08:45 - continuous pulse ox machine ordered  0900 - Notified unit secretary to call EKG tech to do a stat EKG  16:42 - Messaged Dr. Doristine Bosworth to notify him of oral temp of 100

## 2019-03-31 NOTE — H&P (Signed)
History and Physical    Robin Snow:[id number] DOB: 1941/03/17 DOA: 03/31/2019  PCP: [name], MD  Patient coming from: Home  I have personally briefly reviewed patient's old medical records in [facility]  Chief Complaint: Fever, bleeding from wound  HPI: Robin Snow is a 78 y.o. female with medical history significant of a.fib, HTN, CKD, stroke.  Patient had R TKA in [place] in 2019.  Patient recently admitted from 10/30-12/23 for dislocation of R knee prosthesis with septic joint.  Closed reduction by Dr. Jena Gauss 11/18.  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  Ultimately underwent R AKA by Dr. Lajoyce Corners.  This complicated by sepsis from E.Coli bacteremia, spent several days in ICU on vent.  Also complicated by AKA wound dehissance.  Patient finally discharged 12/23 to [facility] health care on rocephin to complete 6 weeks of therapy with end date of 1/6.  For a full recounting of the prolonged, nearly 2 month hospital stay, please see Dr. Dennison Nancy discharge summary on 12/23.  Interm history:  Per SNF: patient didn't get rocephin yesterday (12/24).  At around 7pm they noticed a small amount of bloody drainage from dressing and noticed foul odor from wound.  At 745pm noticed dressing was saturated and at 9pm now oozing blood around dressing.  Patient was noted to be running fever of 100.7 at facility.  Facility confirms that coumadin is still on hold as per DC summary plan.  Per patient she has somewhat increased pain today compared to a couple of days ago in the leg.  Though pain isnt severe she states.   ED Course: No fever in ED, lab work actually looks fairly stable compared to 2 days ago.  CT of RLE femur is worrisome for hematoma with probable superimposed infection extending from amputation site.  EDP ordered rocephin + Vanc.   Review of Systems: As per HPI, otherwise all review of systems  negative.  Past Medical History:  Diagnosis Date  . AKI (acute kidney injury) (HCC) 04/2016  . Anemia   . Arthritis   . Chronic atrial fibrillation (HCC)   . Chronic edema   . Chronic venous insufficiency   . CKD (chronic kidney disease), stage III   . Coagulopathy (HCC)   . Diabetes mellitus    Type 2  . Diverticulosis   . Dyslipidemia   . GERD (gastroesophageal reflux disease)   . GI bleed 2015   a. lower GI from diverticular source 2015.  [name] Hx of transfusion of packed red blood cells   . Hypertension   . Morbid obesity (HCC)   . Obstructive sleep apnea    on CPAP  . Pulmonary hypertension (HCC)   . Pyelonephritis 04/2016  . Renal infarct (HCC)    a. 04/2016- adm with flank pain, ? pyelo vs renal infarct in setting of recent subtherapeutic INR.  [name] Sinus bradycardia   . Stroke [facility]) 2015    Past Surgical History:  Procedure Laterality Date  . AMPUTATION Right 03/01/2019   Procedure: AMPUTATION ABOVE KNEE;  Surgeon: [name], MD;  Location: [facility] OR;  Service: Orthopedics;  Laterality: Right;  . CESAREAN SECTION    . COLONOSCOPY Left 01/24/2013   Procedure: COLONOSCOPY;  Surgeon: Willis Modena, MD;  Location: [facility] ENDOSCOPY;  Service: Endoscopy;  Laterality: Left;  . ESOPHAGOGASTRODUODENOSCOPY Left 01/23/2013   Procedure: ESOPHAGOGASTRODUODENOSCOPY (EGD);  Surgeon: Willis Modena, MD;  Location: [facility] ENDOSCOPY;  Service: Endoscopy;  Laterality: Left;  .  EYE SURGERY Bilateral    cataract surgery  . JOINT REPLACEMENT    . KNEE CLOSED REDUCTION Right 02/22/2019   Procedure: CLOSED MANIPULATION KNEE;  Surgeon: Roby Lofts, MD;  Location: [facility] OR;  Service: Orthopedics;  Laterality: Right;  . PACEMAKER IMPLANT [name] 09/09/2016   Procedure: Pacemaker Implant;  Surgeon: Marinus Maw, MD;  Location: [facility] INVASIVE CV LAB;  Service: Cardiovascular;  Laterality: [name];  . REPLACEMENT TOTAL KNEE BILATERAL    . TUBAL LIGATION       reports that she quit smoking about 25 years ago.  Her smoking use included cigarettes. She has a 4.08 pack-year smoking history. She has never used smokeless tobacco. She reports that she does not drink alcohol or use drugs.  Allergies  Allergen Reactions  . Aspirin Hives and Swelling    Angioedema   . Rofecoxib Hives  . Hydrocodone Hives    Tolerates oxycodone and tramadol    Family History  Problem Relation Age of Onset  . Cancer Father   . Stroke Maternal Aunt      Prior to Admission medications   Medication Sig Start Date End Date Taking? Authorizing Provider  amiodarone (PACERONE) 200 MG tablet Take 1 tablet (200 mg total) by mouth daily. 03/30/19   Roberto Scales D, MD  cefTRIAXone (ROCEPHIN) IVPB Inject 2 g into the vein daily. Indication:  E Coli Endocarditis Last Day of Therapy:  04/12/2019 Labs - Once weekly:  CBC/D and BMP, Labs - Every other week:  ESR and CRP 03/29/19 04/27/19  Roberto Scales D, MD  diltiazem (CARDIZEM CD) 120 MG 24 hr capsule Take 1 capsule (120 mg total) by mouth daily. 03/29/19   Roberto Scales D, MD  ferrous sulfate 300 (60 Fe) MG/5ML syrup Take 5 mLs (300 mg total) by mouth 2 (two) times daily with a meal. 03/29/19   Roberto Scales D, MD  gabapentin (NEURONTIN) 300 MG capsule Take 1 capsule (300 mg total) by mouth 3 (three) times daily. Need appointment before next refill 03/29/19   Roberto Scales D, MD  lovastatin (MEVACOR) 20 MG tablet Take 1 tablet (20 mg total) by mouth at bedtime. Need appointment for further refills 03/29/19   Roberto Scales D, MD  metoprolol tartrate (LOPRESSOR) 50 MG tablet Take 1 tablet (50 mg total) by mouth 2 (two) times daily. 03/29/19   Laverna Peace, MD  pantoprazole (PROTONIX) 40 MG tablet Take 1 tablet (40 mg total) by mouth daily. 03/30/19   Laverna Peace, MD  senna-docusate (SENOKOT-S) 8.6-50 MG tablet Take 2 tablets by mouth 2 (two) times daily. 03/29/19   Laverna Peace, MD  traMADol (ULTRAM) 50 MG tablet Take 1-2 tablets (50-100 mg total) by mouth every 6  (six) hours as needed for moderate pain. 03/29/19   Laverna Peace, MD  Vitamin D, Ergocalciferol, (DRISDOL) 1.25 MG (50000 UT) CAPS capsule Take 1 capsule (50,000 Units total) by mouth every 7 (seven) days. 03/30/19   Roberto Scales D, MD  warfarin (COUMADIN) 5 MG tablet HOLD AND DO NOT TAKE UNTIL WOUND EVALUATED BY DR DUDA  TAKE 1 TABLET BY MOUTH DAILY EXCEPT 1 & 1/2 TABLETS ON WEDNESDAY OR AS DIRECTED BY ANTICOAGULATION CLINIC 03/29/19   Roberto Scales D, MD    Physical Exam: Vitals:   03/31/19 0300 03/31/19 0400 03/31/19 0415 03/31/19 0430  BP: (!) 147/79 (!) 143/68  (!) 144/69  Pulse: 76 73 72 74  Resp: 17 17 14 16   Temp:  TempSrc:      SpO2: 92% 94% 92% 91%    Constitutional: NAD, calm, comfortable Eyes: PERRL, lids and conjunctivae normal ENMT: Mucous membranes are moist. Posterior pharynx clear of any exudate or lesions.Normal dentition.  Neck: normal, supple, no masses, no thyromegaly Respiratory: clear to auscultation bilaterally, no wheezing, no crackles. Normal respiratory effort. No accessory muscle use.  Cardiovascular: Regular rate and rhythm, no murmurs / rubs / gallops. No extremity edema. 2+ pedal pulses. No carotid bruits.  Abdomen: no tenderness, no masses palpated. No hepatosplenomegaly. Bowel sounds positive.  Musculoskeletal: no clubbing / cyanosis. No joint deformity upper and lower extremities. Good ROM, no contractures. Normal muscle tone.  Skin:  Neurologic: CN 2-12 grossly intact. Sensation intact, DTR normal. Strength 5/5 in all 4.  Psychiatric: Oriented to person, place, situation, but not time.   Labs on Admission: I have personally reviewed following labs and imaging studies  CBC: Recent Labs  Lab 03/26/19 0434 03/27/19 0427 03/28/19 0335 03/29/19 0509 03/31/19 0044  WBC 6.2 6.6 8.8 7.5 8.6  NEUTROABS  --   --   --   --  3.9  HGB 8.7* 8.7* 9.1* 8.6* 9.3*  HCT 27.9* 28.0* 29.8* 28.1* 30.0*  MCV 97.6 97.2 98.0 98.9 98.0  PLT 182 175  220 216 220   Basic Metabolic Panel: Recent Labs  Lab 03/25/19 0357 03/31/19 0044  NA 140 137  K 3.9 4.2  CL 105 103  CO2 26 25  GLUCOSE 97 102*  BUN 18 13  CREATININE 1.00 0.80  CALCIUM 8.0* 8.3*   GFR: Estimated Creatinine Clearance: 70.2 mL/min (by C-G formula based on SCr of 0.8 mg/dL). Liver Function Tests: Recent Labs  Lab 03/25/19 0357 03/31/19 0044  AST 29 27  ALT 25 22  ALKPHOS 92 118  BILITOT 0.8 1.3*  PROT 5.3* 5.9*  ALBUMIN 1.9* 2.1*   No results for input(s): LIPASE, AMYLASE in the last 168 hours. No results for input(s): AMMONIA in the last 168 hours. Coagulation Profile: Recent Labs  Lab 03/26/19 0434 03/27/19 0427 03/28/19 0335 03/29/19 0509 03/31/19 0044  INR 1.2 1.2 1.2 1.2 1.2   Cardiac Enzymes: No results for input(s): CKTOTAL, CKMB, CKMBINDEX, TROPONINI in the last 168 hours. BNP (last 3 results) No results for input(s): PROBNP in the last 8760 hours. HbA1C: No results for input(s): HGBA1C in the last 72 hours. CBG: No results for input(s): GLUCAP in the last 168 hours. Lipid Profile: No results for input(s): CHOL, HDL, LDLCALC, TRIG, CHOLHDL, LDLDIRECT in the last 72 hours. Thyroid Function Tests: No results for input(s): TSH, T4TOTAL, FREET4, T3FREE, THYROIDAB in the last 72 hours. Anemia Panel: No results for input(s): VITAMINB12, FOLATE, FERRITIN, TIBC, IRON, RETICCTPCT in the last 72 hours. Urine analysis:    Component Value Date/Time   COLORURINE YELLOW 03/31/2019 0243   APPEARANCEUR CLEAR 03/31/2019 0243   LABSPEC 1.019 03/31/2019 0243   PHURINE 8.0 03/31/2019 0243   GLUCOSEU NEGATIVE 03/31/2019 0243   GLUCOSEU NEGATIVE 09/06/2017 1200   HGBUR NEGATIVE 03/31/2019 0243   BILIRUBINUR NEGATIVE 03/31/2019 0243   BILIRUBINUR Neg 10/18/2017 1453   KETONESUR NEGATIVE 03/31/2019 0243   PROTEINUR NEGATIVE 03/31/2019 0243   UROBILINOGEN 0.2 10/18/2017 1453   UROBILINOGEN 1.0 09/06/2017 1200   NITRITE NEGATIVE 03/31/2019 0243    LEUKOCYTESUR NEGATIVE 03/31/2019 0243    Radiological Exams on Admission: DG Chest 1 View  Result Date: 03/31/2019 CLINICAL DATA:  Infected wound postop EXAM: CHEST  1 VIEW COMPARISON:  March 09, 2019  FINDINGS: There is unchanged cardiomegaly. A left-sided pacemaker seen. Overlying spinal stimulator leads. There is a small left pleural effusion. Hazy patchy airspace opacity at both lung bases. Pulmonary vascular congestion. No acute osseous abnormality. IMPRESSION: Small left pleural effusion with pulmonary vascular congestion. Hazy airspace opacity seen at both lung bases which could be due to edema, atelectasis, and/or infectious etiology. Electronically Signed   By: Jonna Clark M.D.   On: 03/31/2019 02:34   CT ABDOMEN PELVIS W CONTRAST  Result Date: 03/31/2019 CLINICAL DATA:  Right lower quadrant pain increased bleeding and worsening infection of right lower leg post amputation November. EXAM: CT ABDOMEN AND PELVIS WITH CONTRAST TECHNIQUE: Multidetector CT imaging of the abdomen and pelvis was performed using the standard protocol following bolus administration of intravenous contrast. CONTRAST:  OMNIPAQUE IOHEXOL 300 MG/ML  SOLN COMPARISON:  CT 03/03/2019 FINDINGS: Lower chest: Small bilateral effusions. Adjacent passive atelectasis. Some patchy reticulonodular opacities in the bases could reflect atelectatic change though underlying infection or inflammation is not excluded. There is pronounced cardiomegaly with predominantly biatrial enlargement. Pacer wires terminate near the septum and right atrium. Coronary artery calcifications are present. Hepatobiliary: No focal liver abnormality is seen. Patient is post cholecystectomy. Slight prominence of the biliary tree likely related to reservoir effect. No calcified intraductal gallstones. Pancreas: Atrophic appearance of the pancreas. No peripancreatic inflammation or ductal dilatation. Spleen: Normal in size without focal abnormality.  Adrenals/Urinary Tract: Asymmetric left renal atrophy. Extensive cortical scarring of the right kidney. Cortical cyst in the upper pole right kidney is similar to prior. No worrisome renal lesion. There is mild bilateral urothelial thickening and circumferential bladder wall thickening greater than expected for underdistention. No visible calculi or frank hydronephrosis. Stomach/Bowel: Distal esophagus, stomach and duodenal sweep are unremarkable. No small bowel wall thickening or dilatation. No evidence of obstruction. The appendix is not well visualized. A diminutive partially air-filled appendix is seen extending from the cecal tip without focal periappendiceal inflammation. Extensive pancolonic diverticula without focal pericolonic inflammation to suggest diverticulitis. Vascular/Lymphatic: No pathologically enlarged nodes in the abdomen or pelvis. Reproductive: Anteverted uterus. No concerning adnexal lesions. Other: A spinal stimulator pack is seen in the posterior soft tissues of the left flank with leads terminating at the T9 level. Small volume of free fluid seen layering in the deep pelvis, nonspecific given the extensive circumferential body wall edema. Furthermore, body wall edema slightly asymmetric to the right hip, flank and abdominal wall. Overlying skin thickening is present as well which should be assessed for ascites. Musculoskeletal: There is increasing hyperdense expansion of the anterior muscular compartment the right thigh involving predominantly the sartorius, rectus femoris distal iliopsoas musculature as well as new hyperdense expansion within the medial thigh involving the right operator externus. Marked asymmetric overlying soft tissue swelling is worrisome for possible infection. These changes extend below the level of cross-sectional imaging. Stable lobular simple fluid attenuation cystic structure anterior to the left greater trochanter is suggestive of a bursitis. Multilevel  degenerative changes are present in the imaged portions of the spine. Severe S-shaped scoliotic curvature of the spine with associated chest wall deformity and pelvic tilt is similar to comparison studies. Moderate to severe degenerative changes in both hips. IMPRESSION: 1. Increased hyperdense expansion of the anterior muscular compartment the right thigh with new hyperdense expansion within the proximal medial thigh. Features are suggestive intramuscular hemorrhage/hematoma. However, given marked asymmetric overlying soft tissue swelling, underlying infection cannot be fully excluded. 2. Circumferential bladder wall thickening and bilateral ureteral urothelial thickening  concerning for a cystitis and possible ascending urinary tract infection. Correlate with urinalysis. 3. Additional features of anasarca with bilateral effusions, severe body wall edema, and trace ascites. 4. Some patchy reticulonodular opacities in the bases could reflect atelectatic change though underlying infection or inflammation is not excluded. 5. Cardiomegaly with predominantly biatrial enlargement. 6. Asymmetric left renal atrophy. Cortical scarring in the right kidney, similar to prior. 7. Aortic Atherosclerosis (ICD10-I70.0). 8. Additional chronic and incidental findings, detailed above. These results were called by telephone at the time of interpretation on 03/31/2019 at 2:38 am to provider [facility] , who verbally acknowledged these results. Electronically Signed   By: Kreg Shropshire M.D.   On: 03/31/2019 02:39   CT FEMUR RIGHT W CONTRAST  Result Date: 03/31/2019 CLINICAL DATA:  Lower extremity infection versus hematoma, amputation in November EXAM: CT OF THE LOWER RIGHT EXTREMITY WITH CONTRAST TECHNIQUE: Multidetector CT imaging of the lower right extremity was performed according to the standard protocol following intravenous contrast administration. COMPARISON:  CT same day CONTRAST:  OMNIPAQUE IOHEXOL 300 MG/ML  SOLN  FINDINGS: Bones/Joint/Cartilage The patient is status post midshaft femoral amputation. No fracture or dislocation is seen. No areas of cortical destruction or periosteal reaction. There is advanced right hip osteoarthritis with joint space loss and marginal osteophyte formation. Diffuse osteopenia seen. Ligaments Suboptimally assessed by CT. Muscles and Tendons There is diffusely heterogeneously enhancing enlargement throughout the muscles surrounding the femur extending the way to the level of the greater trochanter. This extends the overlying amputation site where there focal areas ulceration skin thickening heterogeneous fluid within the soft tissues. Definite loculated fluid collection. Soft tissues Area ulceration seen at the amputation site with diffuse subcutaneous edema skin thickening. No subcutaneous emphysema seen soft tissues. The visualized deep pelvis is grossly. Scattered vascular calcifications noted. IMPRESSION: 1. Findings suggestive of diffuse intramuscular hematoma surrounding the lower extremity with probable superimposed infection extending from the amputation site. 2. Areas of ulceration at the amputation site with non loculated fluid which could represent phlegmon/early abscess. There is diffuse cellulitis surrounding the lower extremity. No definite evidence of osteomyelitis. Electronically Signed   By: Jonna Clark M.D.   On: 03/31/2019 03:42   DG FEMUR, MIN 2 VIEWS RIGHT  Result Date: 03/31/2019 CLINICAL DATA:  Infected, postop EXAM: RIGHT FEMUR 2 VIEWS COMPARISON:  None. FINDINGS: The patient is status post mid femur amputation. There is diffuse soft tissue swelling. No fracture or dislocation is seen. There is diffuse osteopenia. Dense vascular calcifications. IMPRESSION: Status post amputation.  No definite acute osseous abnormality. Electronically Signed   By: Jonna Clark M.D.   On: 03/31/2019 02:33    EKG: Independently reviewed.  Assessment/Plan Principal Problem:    Wound dehiscence Active Problems:   Essential hypertension   AF (paroxysmal atrial fibrillation) (HCC)   Bacteremia due to Escherichia coli   S/P AKA (above knee amputation), right (HCC)    1. Wound dehiscence / infection of amputation site - 1. Got rocephin + Vanc in ED 2. Continue rocephin for now 3. MRSA PCR nares 4. BCx pending 5. Observe for development of SIRS 6. Call Dr. Lajoyce Corners in AM 7. Wound care consult 2. E.Coli bacteremia / endocarditis - 1. Continue rocephin 2g IV daily 2. Stop date 04/11/2018 3. A.Fib - 1. Continue home amiodarone, cardizem, and metoprolol 2. Coumadin remains on hold 4. HTN - 1. Continue home BP meds  DVT prophylaxis: SCD Code Status: Full Family Communication: No family in room Disposition Plan: SNF after  admit Consults called: None Admission status: Place in obs    Rebel Willcutt M. DO [facility]  How to contact the [facility] Attending or Consulting provider 7A - 7P or covering provider during after hours 7P -7A, for this patient?  1. Check the care team in [facility] and look for a) attending/consulting [facility] provider listed and b) the [facility] team listed 2. Log into [email or web address]  Amion Physician Scheduling and messaging for groups and whole hospitals  On call and physician scheduling software for group practices, residents, hospitalists and other medical providers for call, clinic, rotation and shift schedules. On[employer] is a hospital-wide system for scheduling doctors and paging doctors on call. EasyPlot is for scientific plotting and data analysis.  [email or web address]  and use [facility] universal password to access. If you do not have the password, please contact the hospital operator.  3. Locate the [facility] provider you are looking for under [facility] and page to a number that you can be directly reached. 4. If you still have difficulty reaching the provider, please page the [facility] (Director on Call) for the Hospitalists listed on amion for  assistance.  03/31/2019, 5:01 AM

## 2019-03-31 NOTE — Progress Notes (Signed)
15:45 - Robin Snow, [phone] called in the morning for an update on his mother.  Patient gave nurse permission to give son information regarding her status.  Attempted to call son and give him update, but received voicemail.  Left message stating that he could call me back for an update.

## 2019-03-31 NOTE — Progress Notes (Signed)
Arrived from ED- a/o x4- dressing to rt aka saturated draining foul serosanquinous fluid- dressing changed- noted few staples still in place- majority of edge has seperated- base bloody.

## 2019-03-31 NOTE — Progress Notes (Signed)
78 year old female with several comorbidities who was recently discharged from the hospital on 03/29/2019 was readmitted early this morning due to fever and bleeding from the wound with possibly having wound dehiscence.  Please see H&P for details.  Patient seen and examined.  She complains of pain in the right stump.  No other complaint.  She was slightly groggy and it took her time to answer questions about orientation.  Did not have any chest pain or shortness of breath.  I was earlier called by RN that patient's heart rate was 140.  EKG was obtained which shows paced rhythm with a heart rate of 67.  Vitals stable and in fact slightly elevated diastolic blood pressure.  I have consulted orthopedics and spoke to Dr. Erlinda Hong personally who will evaluate patient.  Continue current medications.

## 2019-03-31 NOTE — ED Triage Notes (Signed)
Pt from [facility] via [facility] C/O increased bleeding and worsening infection of left lower leg post amputation in November.

## 2019-04-01 LAB — CBC WITH DIFFERENTIAL/PLATELET
Abs Immature Granulocytes: 0.04 10*3/uL (ref 0.00–0.07)
Basophils Absolute: 0 10*3/uL (ref 0.0–0.1)
Basophils Relative: 1 %
Eosinophils Absolute: 0.1 10*3/uL (ref 0.0–0.5)
Eosinophils Relative: 2 %
HCT: 27.8 % — ABNORMAL LOW (ref 36.0–46.0)
Hemoglobin: 8.7 g/dL — ABNORMAL LOW (ref 12.0–15.0)
Immature Granulocytes: 1 %
Lymphocytes Relative: 29 %
Lymphs Abs: 1.9 10*3/uL (ref 0.7–4.0)
MCH: 29.7 pg (ref 26.0–34.0)
MCHC: 31.3 g/dL (ref 30.0–36.0)
MCV: 94.9 fL (ref 80.0–100.0)
Monocytes Absolute: 1.3 10*3/uL — ABNORMAL HIGH (ref 0.1–1.0)
Monocytes Relative: 20 %
Neutro Abs: 3.2 10*3/uL (ref 1.7–7.7)
Neutrophils Relative %: 47 %
Platelets: 238 10*3/uL (ref 150–400)
RBC: 2.93 MIL/uL — ABNORMAL LOW (ref 3.87–5.11)
RDW: 15.9 % — ABNORMAL HIGH (ref 11.5–15.5)
WBC: 6.5 10*3/uL (ref 4.0–10.5)
nRBC: 0 % (ref 0.0–0.2)

## 2019-04-01 LAB — BASIC METABOLIC PANEL
Anion gap: 8 (ref 5–15)
BUN: 13 mg/dL (ref 8–23)
CO2: 28 mmol/L (ref 22–32)
Calcium: 8 mg/dL — ABNORMAL LOW (ref 8.9–10.3)
Chloride: 102 mmol/L (ref 98–111)
Creatinine, Ser: 0.89 mg/dL (ref 0.44–1.00)
GFR calc Af Amer: >60 mL/min (ref >60)
GFR calc non Af Amer: >60 mL/min (ref >60)
Glucose, Bld: 103 mg/dL — ABNORMAL HIGH (ref 70–99)
Potassium: 3.3 mmol/L — ABNORMAL LOW (ref 3.5–5.1)
Sodium: 138 mmol/L (ref 135–145)

## 2019-04-01 LAB — URINE CULTURE: Culture: <10000 — AB

## 2019-04-01 NOTE — Progress Notes (Signed)
PROGRESS NOTE    Robin Snow  JXB:[id number] DOB: 09-20-1940 DOA: 03/31/2019 PCP: [name], MD   Brief Narrative:  HPI: Robin Snow is a 78 y.o. female with medical history significant of a.fib, HTN, CKD, stroke.  Patient had R TKA in [place] in 2019.  Patient recently admitted from 10/30-12/23 for dislocation of R knee prosthesis with septic joint.  Closed reduction by Dr. Jena Gauss 11/18.  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  Ultimately underwent R AKA by Dr. Lajoyce Corners.  This complicated by sepsis from E.Coli bacteremia, spent several days in ICU on vent.  Also complicated by AKA wound dehissance.  Patient finally discharged 12/23 to [facility] health care on rocephin to complete 6 weeks of therapy with end date of 1/6.  For a full recounting of the prolonged, nearly 2 month hospital stay, please see Dr. Dennison Nancy discharge summary on 12/23.  Interm history:  Per SNF: patient didn't get rocephin yesterday (12/24).  At around 7pm they noticed asmall amount ofbloodydrainage from dressingand noticedfoulodor from wound. At 745pm noticed dressing was saturated and at 9pm now oozing blood around dressing.  Patient was noted to be running fever of 100.7 at facility.  Facility confirms that coumadin is still on hold as per DC summary plan.  Per patient she has somewhat increased pain today compared to a couple of days ago in the leg.  Though pain isnt severe she states.   ED Course: No fever in ED, lab work actually looks fairly stable compared to 2 days ago.  CT of RLE femur is worrisome for hematoma with probable superimposed infection extending from amputation site.  EDP ordered rocephin + Vanc.   Assessment & Plan:   Principal Problem:   Wound dehiscence Active Problems:   Essential hypertension   AF (paroxysmal atrial fibrillation) (HCC)   Bacteremia due to Escherichia coli   S/P AKA (above  knee amputation), right (HCC)   Wound dehiscence/infection of amputation site/right stump: Dressing in place.  Wound care on board.  Seen by orthopedics Dr. Roda Shutters who is mention in his note that no surgical intervention is needed urgently and that Dr. Lajoyce Corners will see patient and provide further recommendations.  Waiting for Ortho to do so.  We will continue antibiotics per his recommendations.  Management deferred to orthopedics.  E. coli bacteremia/endocarditis: Continue Rocephin 2 g IV daily until 04/11/2018 per ID recommendations from recent hospitalization.  Permanent atrial fibrillation: Rates fluctuating but mostly controlled without symptoms.  Continue home dose of metoprolol, Cardizem and amiodarone.  Coumadin on hold due to hematoma at this time.  Coumadin on hold due to hematoma.  Will resume anticoagulation once cleared by orthopedics.  Essential hypertension: Controlled.  Continue amiodarone, diltiazem and metoprolol.   DVT prophylaxis: SCD Code Status: Full code Family Communication: Daughter present at bedside.  Plan of care discussed with both of them. Disposition Plan: To be determined based on further Ortho plans.  Estimated body mass index is 35.22 kg/m as calculated from the following:   Height as of this encounter:  (1.702 m).   Weight as of this encounter: 102 kg.  Pressure Injury 03/01/19 Buttocks Medial Stage II -  Partial thickness loss of dermis presenting as a shallow open ulcer with a red, pink wound bed without slough. (Active)  03/01/19 1732  Location: Buttocks  Location Orientation: Medial  Staging: Stage II -  Partial thickness loss of dermis presenting as a shallow open ulcer  with a red, pink wound bed without slough.  Wound Description (Comments):   Present on Admission: Yes     Pressure Injury 03/03/19 Thigh Right Stage II -  Partial thickness loss of dermis presenting as a shallow open ulcer with a red, pink wound bed without slough. (Active)  03/03/19  1000  Location: Thigh  Location Orientation: Right  Staging: Stage II -  Partial thickness loss of dermis presenting as a shallow open ulcer with a red, pink wound bed without slough.  Wound Description (Comments):   Present on Admission:      Nutritional status:               Consultants:   Orthopedics  Procedures:   None  Antimicrobials:   Rocephin   Subjective: Seen and examined.  Daughter at the bedside.  She has no complaints.  She is more alert and oriented.  Objective: Vitals:   03/31/19 2313 04/01/19 0544 04/01/19 0753 04/01/19 1023  BP: 131/68 132/71 121/62   Pulse: (!) 59 62 63   Resp: 17 16    Temp: 99 F (37.2 C) 98.4 F (36.9 C) 98.6 F (37 C)   TempSrc: Oral Oral Oral   SpO2: 98% 98% 97%   Weight:    102 kg  Height:    5\' 7"  (1.702 m)    Intake/Output Summary (Last 24 hours) at 04/01/2019 1321 Last data filed at 03/31/2019 1546 Gross per 24 hour  Intake --  Output 701 ml  Net -701 ml   Filed Weights   04/01/19 1023  Weight: 102 kg    Examination:  General exam: Appears calm and comfortable  Respiratory system: Clear to auscultation. Respiratory effort normal. Cardiovascular system: S1 & S2 heard, irregularly irregular rate and rhythm. No JVD, murmurs, rubs, gallops or clicks.  +2 pitting edema left lower extremity and right thigh. Gastrointestinal system: Abdomen is nondistended, soft and nontender. No organomegaly or masses felt. Normal bowel sounds heard. Central nervous system: Alert and oriented. No focal neurological deficits. Extremities: Dressing on the right stump. Skin: No rashes, lesions or ulcers Psychiatry: Judgement and insight appear poor. Mood & affect poor   Data Reviewed: I have personally reviewed following labs and imaging studies  CBC: Recent Labs  Lab 03/26/19 0434 03/27/19 0427 03/28/19 0335 03/29/19 0509 03/31/19 0044 03/31/19 0708  WBC 6.2 6.6 8.8 7.5 8.6  --   NEUTROABS  --   --   --   --   3.9  --   HGB 8.7* 8.7* 9.1* 8.6* 9.3* 9.9*  HCT 27.9* 28.0* 29.8* 28.1* 30.0* 30.2*  MCV 97.6 97.2 98.0 98.9 98.0  --   PLT 182 175 220 216 220  --    Basic Metabolic Panel: Recent Labs  Lab 03/31/19 0044  NA 137  K 4.2  CL 103  CO2 25  GLUCOSE 102*  BUN 13  CREATININE 0.80  CALCIUM 8.3*   GFR: Estimated Creatinine Clearance: 70 mL/min (by C-G formula based on SCr of 0.8 mg/dL). Liver Function Tests: Recent Labs  Lab 03/31/19 0044  AST 27  ALT 22  ALKPHOS 118  BILITOT 1.3*  PROT 5.9*  ALBUMIN 2.1*   No results for input(s): LIPASE, AMYLASE in the last 168 hours. No results for input(s): AMMONIA in the last 168 hours. Coagulation Profile: Recent Labs  Lab 03/26/19 0434 03/27/19 0427 03/28/19 0335 03/29/19 0509 03/31/19 0044  INR 1.2 1.2 1.2 1.2 1.2   Cardiac Enzymes: No results for input(s): CKTOTAL,  CKMB, CKMBINDEX, TROPONINI in the last 168 hours. BNP (last 3 results) No results for input(s): PROBNP in the last 8760 hours. HbA1C: No results for input(s): HGBA1C in the last 72 hours. CBG: No results for input(s): GLUCAP in the last 168 hours. Lipid Profile: No results for input(s): CHOL, HDL, LDLCALC, TRIG, CHOLHDL, LDLDIRECT in the last 72 hours. Thyroid Function Tests: No results for input(s): TSH, T4TOTAL, FREET4, T3FREE, THYROIDAB in the last 72 hours. Anemia Panel: No results for input(s): VITAMINB12, FOLATE, FERRITIN, TIBC, IRON, RETICCTPCT in the last 72 hours. Sepsis Labs: Recent Labs  Lab 03/31/19 0044  LATICACIDVEN 1.3    Recent Results (from the past 240 hour(s))  SARS CORONAVIRUS 2 (TAT 6-24 HRS) Nasopharyngeal Nasopharyngeal Swab     Status: None   Collection Time: 03/28/19  3:00 PM   Specimen: Nasopharyngeal Swab  Result Value Ref Range Status   SARS Coronavirus 2 NEGATIVE NEGATIVE Final    Comment: (NOTE) SARS-CoV-2 target nucleic acids are NOT DETECTED. The SARS-CoV-2 RNA is generally detectable in upper and lower respiratory  specimens during the acute phase of infection. Negative results do not preclude SARS-CoV-2 infection, do not rule out co-infections with other pathogens, and should not be used as the sole basis for treatment or other patient management decisions. Negative results must be combined with clinical observations, patient history, and epidemiological information. The expected result is Negative. Fact Sheet for Patients: HairSlick.no Fact Sheet for Healthcare Providers: quierodirigir.com This test is not yet approved or cleared by the [place] FDA and  has been authorized for detection and/or diagnosis of SARS-CoV-2 by FDA under an Emergency Use Authorization (EUA). This EUA will remain  in effect (meaning this test can be used) for the duration of the COVID-19 declaration under Section 56 4(b)(1) of the Act, 21 U.S.C. section 360bbb-3(b)(1), unless the authorization is terminated or revoked sooner. Performed at [facility], [address], [place], [place] [address]   Blood culture (routine x 2)     Status: None (Preliminary result)   Collection Time: 03/31/19 12:44 AM   Specimen: BLOOD LEFT HAND  Result Value Ref Range Status   Specimen Description BLOOD LEFT HAND  Final   Special Requests   Final    BOTTLES DRAWN AEROBIC ONLY Blood Culture results may not be optimal due to an inadequate volume of blood received in culture bottles   Culture   Final    NO GROWTH 1 DAY Performed at [facility], [address], [place], [place] [address]    Report Status PENDING  Incomplete  Blood culture (routine x 2)     Status: None (Preliminary result)   Collection Time: 03/31/19 12:45 AM   Specimen: BLOOD  Result Value Ref Range Status   Specimen Description BLOOD LEFT ARM  Final   Special Requests   Final    BOTTLES DRAWN AEROBIC ONLY Blood Culture adequate volume   Culture   Final    NO GROWTH 1 DAY Performed at [facility], [address], [place], [place] [address]    Report Status PENDING  Incomplete  SARS CORONAVIRUS 2 (TAT 6-24 HRS) Nasopharyngeal Nasopharyngeal Swab     Status: None   Collection Time: 03/31/19  2:26 AM   Specimen: Nasopharyngeal Swab  Result Value Ref Range Status   SARS Coronavirus 2 NEGATIVE NEGATIVE Final    Comment: (NOTE) SARS-CoV-2 target nucleic acids are NOT DETECTED. The SARS-CoV-2 RNA is generally detectable in upper and  lower respiratory specimens during the acute phase of infection. Negative results do not preclude SARS-CoV-2 infection, do not rule out co-infections with other pathogens, and should not be used as the sole basis for treatment or other patient management decisions. Negative results must be combined with clinical observations, patient history, and epidemiological information. The expected result is Negative. Fact Sheet for Patients: HairSlick.nohttps://www.fda.gov/media/138098/download Fact Sheet for Healthcare Providers: quierodirigir.comhttps://www.fda.gov/media/138095/download This test is not yet approved or cleared by the [place] FDA and  has been authorized for detection and/or diagnosis of SARS-CoV-2 by FDA under an Emergency Use Authorization (EUA). This EUA will remain  in effect (meaning this test can be used) for the duration of the COVID-19 declaration under Section 56 4(b)(1) of the Act, 21 U.S.C. section 360bbb-3(b)(1), unless the authorization is terminated or revoked sooner. Performed at [facility], [address] [place], [place] [address]   Urine culture     Status: Abnormal   Collection Time: 03/31/19  2:43 AM   Specimen: Urine, Random  Result Value Ref Range Status   Specimen Description URINE, RANDOM  Final   Special Requests NONE  Final   Culture (A)  Final    <10,000 COLONIES/mL INSIGNIFICANT GROWTH Performed at [facility], [address] [place], [place] [address]    Report Status 04/01/2019 FINAL  Final       Radiology Studies: DG Chest 1 View  Result Date: 03/31/2019 CLINICAL DATA:  Infected wound postop EXAM: CHEST  1 VIEW COMPARISON:  March 09, 2019 FINDINGS: There is unchanged cardiomegaly. A left-sided pacemaker seen. Overlying spinal stimulator leads. There is a small left pleural effusion. Hazy patchy airspace opacity at both lung bases. Pulmonary vascular congestion. No acute osseous abnormality. IMPRESSION: Small left pleural effusion with pulmonary vascular congestion. Hazy airspace opacity seen at both lung bases which could be due to edema, atelectasis, and/or infectious etiology. Electronically Signed   By: Jonna ClarkBindu  Avutu M.D.   On: 03/31/2019 02:34   CT ABDOMEN PELVIS W CONTRAST  Result Date: 03/31/2019 CLINICAL DATA:  Right lower quadrant pain increased bleeding and worsening infection of right lower leg post amputation November. EXAM: CT ABDOMEN AND PELVIS WITH CONTRAST TECHNIQUE: Multidetector CT imaging of the abdomen and pelvis was performed using the standard protocol following bolus administration of intravenous contrast. CONTRAST:  100mL OMNIPAQUE IOHEXOL 300 MG/ML  SOLN COMPARISON:  CT 03/03/2019 FINDINGS: Lower chest: Small bilateral effusions. Adjacent passive atelectasis. Some patchy reticulonodular opacities in the bases could reflect atelectatic change though underlying infection or inflammation is not excluded. There is pronounced cardiomegaly with predominantly biatrial enlargement. Pacer wires terminate near the septum and right atrium. Coronary artery calcifications are present. Hepatobiliary: No focal liver abnormality is seen. Patient is post cholecystectomy. Slight prominence of the biliary tree likely related to reservoir effect. No calcified intraductal gallstones. Pancreas: Atrophic appearance of the pancreas. No peripancreatic inflammation or ductal dilatation. Spleen: Normal in size without focal abnormality. Adrenals/Urinary Tract: Asymmetric left renal atrophy.  Extensive cortical scarring of the right kidney. Cortical cyst in the upper pole right kidney is similar to prior. No worrisome renal lesion. There is mild bilateral urothelial thickening and circumferential bladder wall thickening greater than expected for underdistention. No visible calculi or frank hydronephrosis. Stomach/Bowel: Distal esophagus, stomach and duodenal sweep are unremarkable. No small bowel wall thickening or dilatation. No evidence of obstruction. The appendix is not well visualized. A diminutive partially air-filled appendix is seen extending from the cecal tip without focal periappendiceal inflammation.  Extensive pancolonic diverticula without focal pericolonic inflammation to suggest diverticulitis. Vascular/Lymphatic: No pathologically enlarged nodes in the abdomen or pelvis. Reproductive: Anteverted uterus. No concerning adnexal lesions. Other: A spinal stimulator pack is seen in the posterior soft tissues of the left flank with leads terminating at the T9 level. Small volume of free fluid seen layering in the deep pelvis, nonspecific given the extensive circumferential body wall edema. Furthermore, body wall edema slightly asymmetric to the right hip, flank and abdominal wall. Overlying skin thickening is present as well which should be assessed for ascites. Musculoskeletal: There is increasing hyperdense expansion of the anterior muscular compartment the right thigh involving predominantly the sartorius, rectus femoris distal iliopsoas musculature as well as new hyperdense expansion within the medial thigh involving the right operator externus. Marked asymmetric overlying soft tissue swelling is worrisome for possible infection. These changes extend below the level of cross-sectional imaging. Stable lobular simple fluid attenuation cystic structure anterior to the left greater trochanter is suggestive of a bursitis. Multilevel degenerative changes are present in the imaged portions of the  spine. Severe S-shaped scoliotic curvature of the spine with associated chest wall deformity and pelvic tilt is similar to comparison studies. Moderate to severe degenerative changes in both hips. IMPRESSION: 1. Increased hyperdense expansion of the anterior muscular compartment the right thigh with new hyperdense expansion within the proximal medial thigh. Features are suggestive intramuscular hemorrhage/hematoma. However, given marked asymmetric overlying soft tissue swelling, underlying infection cannot be fully excluded. 2. Circumferential bladder wall thickening and bilateral ureteral urothelial thickening concerning for a cystitis and possible ascending urinary tract infection. Correlate with urinalysis. 3. Additional features of anasarca with bilateral effusions, severe body wall edema, and trace ascites. 4. Some patchy reticulonodular opacities in the bases could reflect atelectatic change though underlying infection or inflammation is not excluded. 5. Cardiomegaly with predominantly biatrial enlargement. 6. Asymmetric left renal atrophy. Cortical scarring in the right kidney, similar to prior. 7. Aortic Atherosclerosis (ICD10-I70.0). 8. Additional chronic and incidental findings, detailed above. These results were called by telephone at the time of interpretation on 03/31/2019 at 2:38 am to provider [facility] , who verbally acknowledged these results. Electronically Signed   By: Kreg Shropshire M.D.   On: 03/31/2019 02:39   CT FEMUR RIGHT W CONTRAST  Result Date: 03/31/2019 CLINICAL DATA:  Lower extremity infection versus hematoma, amputation in November EXAM: CT OF THE LOWER RIGHT EXTREMITY WITH CONTRAST TECHNIQUE: Multidetector CT imaging of the lower right extremity was performed according to the standard protocol following intravenous contrast administration. COMPARISON:  CT same day CONTRAST:  OMNIPAQUE IOHEXOL 300 MG/ML  SOLN FINDINGS: Bones/Joint/Cartilage The patient is status post  midshaft femoral amputation. No fracture or dislocation is seen. No areas of cortical destruction or periosteal reaction. There is advanced right hip osteoarthritis with joint space loss and marginal osteophyte formation. Diffuse osteopenia seen. Ligaments Suboptimally assessed by CT. Muscles and Tendons There is diffusely heterogeneously enhancing enlargement throughout the muscles surrounding the femur extending the way to the level of the greater trochanter. This extends the overlying amputation site where there focal areas ulceration skin thickening heterogeneous fluid within the soft tissues. Definite loculated fluid collection. Soft tissues Area ulceration seen at the amputation site with diffuse subcutaneous edema skin thickening. No subcutaneous emphysema seen soft tissues. The visualized deep pelvis is grossly. Scattered vascular calcifications noted. IMPRESSION: 1. Findings suggestive of diffuse intramuscular hematoma surrounding the lower extremity with probable superimposed infection extending from the amputation site. 2. Areas of ulceration  at the amputation site with non loculated fluid which could represent phlegmon/early abscess. There is diffuse cellulitis surrounding the lower extremity. No definite evidence of osteomyelitis. Electronically Signed   By: Prudencio Pair M.D.   On: 03/31/2019 03:42   DG FEMUR, MIN 2 VIEWS RIGHT  Result Date: 03/31/2019 CLINICAL DATA:  Infected, postop EXAM: RIGHT FEMUR 2 VIEWS COMPARISON:  None. FINDINGS: The patient is status post mid femur amputation. There is diffuse soft tissue swelling. No fracture or dislocation is seen. There is diffuse osteopenia. Dense vascular calcifications. IMPRESSION: Status post amputation.  No definite acute osseous abnormality. Electronically Signed   By: Prudencio Pair M.D.   On: 03/31/2019 02:33    Scheduled Meds: . amiodarone  200 mg Oral Daily  . Chlorhexidine Gluconate Cloth  6 each Topical Daily  . diltiazem  120 mg Oral  Daily  . ferrous sulfate  300 mg Oral BID WC  . gabapentin  300 mg Oral TID  . metoprolol tartrate  50 mg Oral BID  . pantoprazole  40 mg Oral Daily  . pravastatin  20 mg Oral q1800  . senna-docusate  2 tablet Oral BID  . sodium chloride flush  10-40 mL Intracatheter Q12H  . sodium chloride flush  3 mL Intravenous Once   Continuous Infusions: . cefTRIAXone (ROCEPHIN)  IV 2 g (04/01/19 0349)     LOS: 1 day   Time spent: 29 minutes   Darliss Cheney, MD [facility]  04/01/2019, 1:21 PM   To contact the attending provider between 7A-7P or the covering provider during after hours 7P-7A, please log into the web site [email or web address] and use password [email or web address].

## 2019-04-01 NOTE — Plan of Care (Signed)

## 2019-04-01 NOTE — Consult Note (Signed)
WOC Nurse Consult Note: Reason for Consult: Right AKA surgical wound dehiscence Wound type:Surgical complication Pressure Injury POA: [name]  WOC Nursing was simultaneously consulted with Orthopedics, Dr. Erlinda Hong saw patient at 2pm yesterday. Please see his note from that encounter.  He has communicated with [name] who will consider a surgical revision on Wednesday of next week.  [place] Nursing did not see and will not follow, but  will remain available to this patient, the nursing and medical teams.  Please re-consult if needed. Thanks, Maudie Flakes, MSN, RN, [place], Arther Abbott  Pager# [phone]

## 2019-04-02 LAB — CBC WITH DIFFERENTIAL/PLATELET
Abs Immature Granulocytes: 0.05 10*3/uL (ref 0.00–0.07)
Basophils Absolute: 0 10*3/uL (ref 0.0–0.1)
Basophils Relative: 0 %
Eosinophils Absolute: 0.1 10*3/uL (ref 0.0–0.5)
Eosinophils Relative: 1 %
HCT: 28.1 % — ABNORMAL LOW (ref 36.0–46.0)
Hemoglobin: 8.7 g/dL — ABNORMAL LOW (ref 12.0–15.0)
Immature Granulocytes: 1 %
Lymphocytes Relative: 35 %
Lymphs Abs: 2.4 10*3/uL (ref 0.7–4.0)
MCH: 29.9 pg (ref 26.0–34.0)
MCHC: 31 g/dL (ref 30.0–36.0)
MCV: 96.6 fL (ref 80.0–100.0)
Monocytes Absolute: 1.2 10*3/uL — ABNORMAL HIGH (ref 0.1–1.0)
Monocytes Relative: 18 %
Neutro Abs: 3.1 10*3/uL (ref 1.7–7.7)
Neutrophils Relative %: 45 %
Platelets: 258 10*3/uL (ref 150–400)
RBC: 2.91 MIL/uL — ABNORMAL LOW (ref 3.87–5.11)
RDW: 15.7 % — ABNORMAL HIGH (ref 11.5–15.5)
WBC: 6.8 10*3/uL (ref 4.0–10.5)
nRBC: 0 % (ref 0.0–0.2)

## 2019-04-02 LAB — BASIC METABOLIC PANEL
Anion gap: 7 (ref 5–15)
BUN: 12 mg/dL (ref 8–23)
CO2: 30 mmol/L (ref 22–32)
Calcium: 8.2 mg/dL — ABNORMAL LOW (ref 8.9–10.3)
Chloride: 103 mmol/L (ref 98–111)
Creatinine, Ser: 0.96 mg/dL (ref 0.44–1.00)
GFR calc Af Amer: >60 mL/min (ref >60)
GFR calc non Af Amer: 56 mL/min — ABNORMAL LOW (ref >60)
Glucose, Bld: 84 mg/dL (ref 70–99)
Potassium: 3.4 mmol/L — ABNORMAL LOW (ref 3.5–5.1)
Sodium: 140 mmol/L (ref 135–145)

## 2019-04-02 MED ORDER — OXYCODONE-ACETAMINOPHEN 5-325 MG PO TABS
1.0000 | ORAL_TABLET | Freq: Two times a day (BID) | ORAL | Status: DC | PRN
  Administered 2019-04-02 – 2019-04-04 (×2): 1 via ORAL
  Filled 2019-04-02 (×2): qty 1

## 2019-04-02 NOTE — Plan of Care (Signed)
  Problem: Pain Managment: Goal: General experience of comfort will improve Outcome: Progressing   Problem: Safety: Goal: Ability to remain free from injury will improve Outcome: Progressing   Problem: Skin Integrity: Goal: Risk for impaired skin integrity will decrease Outcome: Progressing   

## 2019-04-02 NOTE — Progress Notes (Addendum)
18:00 - Nurse and Danae Chen RN went to change patient's right leg AKA dressing.  Dressing was saturated with blood.  Wound was completely dehisced with slight oozing.  Necrotic tissue appearing to start to break off from periwound, including subcutaneous tissue and adipose, bone possibly exposed.  Patient was lethargic, easily arousable, and complained of pain.  Vitals were at baseline.  We replaced dressing with a pressure dressing including abdominal pads, gauze, and ACE bandage.  Paged Dr. Erlinda Hong and asked him to come reassess patient.    19:00 - Dr. Erlinda Hong, self, and nurse taking over care for patient at bedside.  Dr. Erlinda Hong removed the dressing and assessed the wound.  He stated to continue with current plan of treatment.  Ordered percocet for better pain control.  Replaced pressure dressing.  Will continue to monitor patient.

## 2019-04-02 NOTE — Progress Notes (Signed)
I was called by floor RN to evaluate AKA wound.  Dressing was completely unwrapped.  She has a large wound dehiscence with a stable postop hematoma.  Slight oozing blood.  Mild foul odor.  Vital signs have been stable and mental status is at baseline.  Hgb has been stable at 8.7 over the last 2 days.  I reapplied pressure dressing tonight.  May leave the pressure dressing on until planned surgery on Wednesday with [name].  I reiterated with the patient that she has a high likelihood of requiring a hip disarticulation.    Azucena Cecil, MD [facility] [phone] 7:28 PM

## 2019-04-02 NOTE — Progress Notes (Signed)
PROGRESS NOTE    Robin Snow  ZOX:[id number] DOB: 03/03/1941 DOA: 03/31/2019 PCP: [name], MD   Brief Narrative:  HPI: Robin Snow is a 78 y.o. female with medical history significant of a.fib, HTN, CKD, stroke.  Patient had R TKA in [place] in 2019.  Patient recently admitted from 10/30-12/23 for dislocation of R knee prosthesis with septic joint.  Closed reduction by Dr. Jena Gauss 11/18.  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  Ultimately underwent R AKA by Dr. Lajoyce Corners.  This complicated by sepsis from E.Coli bacteremia, spent several days in ICU on vent.  Also complicated by AKA wound dehissance.  Patient finally discharged 12/23 to [facility] health care on rocephin to complete 6 weeks of therapy with end date of 1/6.  For a full recounting of the prolonged, nearly 2 month hospital stay, please see Dr. Dennison Nancy discharge summary on 12/23.  Interm history:  Per SNF: patient didn't get rocephin yesterday (12/24).  At around 7pm they noticed asmall amount ofbloodydrainage from dressingand noticedfoulodor from wound. At 745pm noticed dressing was saturated and at 9pm now oozing blood around dressing.  Patient was noted to be running fever of 100.7 at facility.  Facility confirms that coumadin is still on hold as per DC summary plan.  Per patient she has somewhat increased pain today compared to a couple of days ago in the leg.  Though pain isnt severe she states.   ED Course: No fever in ED, lab work actually looks fairly stable compared to 2 days ago.  CT of RLE femur is worrisome for hematoma with probable superimposed infection extending from amputation site.  EDP ordered rocephin + Vanc.   Assessment & Plan:   Principal Problem:   Wound dehiscence Active Problems:   Essential hypertension   AF (paroxysmal atrial fibrillation) (HCC)   Bacteremia due to Escherichia coli   S/P AKA (above  knee amputation), right (HCC)   Wound dehiscence/infection of amputation site/right stump: Dressing in place.  Wound care on board.  Seen by orthopedics Dr. Roda Shutters who has mentioned in his note that no surgical intervention is needed urgently and that Dr. Lajoyce Corners will likely plan on I&D and possible revision AKA on Wednesday.  We will continue antibiotics per his recommendations.  Management deferred to orthopedics.  E. coli bacteremia/endocarditis: Continue Rocephin 2 g IV daily until 04/11/2018 per ID recommendations from recent hospitalization.  Permanent atrial fibrillation: Rates fluctuating but mostly controlled without symptoms.  Continue home dose of metoprolol, Cardizem and amiodarone.  Coumadin on hold due to hematoma at this time. Will resume anticoagulation once cleared by orthopedics.  Essential hypertension: Controlled.  Continue amiodarone, diltiazem and metoprolol.  DVT prophylaxis: SCD Code Status: Full code Family Communication: No family member present at the bedside. Disposition Plan: To be determined based on further Ortho plans.  Estimated body mass index is 35.22 kg/m as calculated from the following:   Height as of this encounter: 5\' 7"  (1.702 m).   Weight as of this encounter: 102 kg.  Pressure Injury 03/01/19 Buttocks Medial Stage II -  Partial thickness loss of dermis presenting as a shallow open ulcer with a red, pink wound bed without slough. (Active)  03/01/19 1732  Location: Buttocks  Location Orientation: Medial  Staging: Stage II -  Partial thickness loss of dermis presenting as a shallow open ulcer with a red, pink wound bed without slough.  Wound Description (Comments):   Present on Admission: Yes  Pressure Injury 03/03/19 Thigh Right Stage II -  Partial thickness loss of dermis presenting as a shallow open ulcer with a red, pink wound bed without slough. (Active)  03/03/19 1000  Location: Thigh  Location Orientation: Right  Staging: Stage II -  Partial  thickness loss of dermis presenting as a shallow open ulcer with a red, pink wound bed without slough.  Wound Description (Comments):   Present on Admission:      Nutritional status:               Consultants:   Orthopedics  Procedures:   None  Antimicrobials:   Rocephin   Subjective: Seen and examined.  She has no complaints.  Objective: Vitals:   04/02/19 0557 04/02/19 0736 04/02/19 1056 04/02/19 1058  BP:  (!) 117/59 122/61 122/61  Pulse:  60 60 60  Resp:      Temp: 99.8 F (37.7 C) 99.9 F (37.7 C)    TempSrc: Oral Oral    SpO2:  95% 97%   Weight:      Height:        Intake/Output Summary (Last 24 hours) at 04/02/2019 1310 Last data filed at 04/02/2019 0800 Gross per 24 hour  Intake 354 ml  Output --  Net 354 ml   Filed Weights   04/01/19 1023  Weight: 102 kg    Examination: General exam: Appears calm and comfortable  Respiratory system: Clear to auscultation. Respiratory effort normal. Cardiovascular system: S1 & S2 heard, RRR. No JVD, murmurs, rubs, gallops or clicks. No pedal edema. Gastrointestinal system: Abdomen is nondistended, soft and nontender. No organomegaly or masses felt. Normal bowel sounds heard. Central nervous system: Alert and oriented. No focal neurological deficits. Extremities: Symmetric 5 x 5 power.  Right AKA/stump with dressing in place.  Direct visualization was deferred to orthopedics. Skin: No rashes, lesions or ulcers.  Psychiatry: Judgement and insight appear poor. Mood & affect flat.   Data Reviewed: I have personally reviewed following labs and imaging studies  CBC: Recent Labs  Lab 03/28/19 0335 03/29/19 0509 03/31/19 0044 03/31/19 0708 04/01/19 1517 04/02/19 0850  WBC 8.8 7.5 8.6  --  6.5 6.8  NEUTROABS  --   --  3.9  --  3.2 3.1  HGB 9.1* 8.6* 9.3* 9.9* 8.7* 8.7*  HCT 29.8* 28.1* 30.0* 30.2* 27.8* 28.1*  MCV 98.0 98.9 98.0  --  94.9 96.6  PLT 220 216 220  --  238 258   Basic Metabolic  Panel: Recent Labs  Lab 03/31/19 0044 04/01/19 1517 04/02/19 0850  NA 137 138 140  K 4.2 3.3* 3.4*  CL 103 102 103  CO2 25 28 30   GLUCOSE 102* 103* 84  BUN 13 13 12   CREATININE 0.80 0.89 0.96  CALCIUM 8.3* 8.0* 8.2*   GFR: Estimated Creatinine Clearance: 58.4 mL/min (by C-G formula based on SCr of 0.96 mg/dL). Liver Function Tests: Recent Labs  Lab 03/31/19 0044  AST 27  ALT 22  ALKPHOS 118  BILITOT 1.3*  PROT 5.9*  ALBUMIN 2.1*   No results for input(s): LIPASE, AMYLASE in the last 168 hours. No results for input(s): AMMONIA in the last 168 hours. Coagulation Profile: Recent Labs  Lab 03/27/19 0427 03/28/19 0335 03/29/19 0509 03/31/19 0044  INR 1.2 1.2 1.2 1.2   Cardiac Enzymes: No results for input(s): CKTOTAL, CKMB, CKMBINDEX, TROPONINI in the last 168 hours. BNP (last 3 results) No results for input(s): PROBNP in the last 8760 hours. HbA1C: No results  for input(s): HGBA1C in the last 72 hours. CBG: No results for input(s): GLUCAP in the last 168 hours. Lipid Profile: No results for input(s): CHOL, HDL, LDLCALC, TRIG, CHOLHDL, LDLDIRECT in the last 72 hours. Thyroid Function Tests: No results for input(s): TSH, T4TOTAL, FREET4, T3FREE, THYROIDAB in the last 72 hours. Anemia Panel: No results for input(s): VITAMINB12, FOLATE, FERRITIN, TIBC, IRON, RETICCTPCT in the last 72 hours. Sepsis Labs: Recent Labs  Lab 03/31/19 0044  LATICACIDVEN 1.3    Recent Results (from the past 240 hour(s))  SARS CORONAVIRUS 2 (TAT 6-24 HRS) Nasopharyngeal Nasopharyngeal Swab     Status: None   Collection Time: 03/28/19  3:00 PM   Specimen: Nasopharyngeal Swab  Result Value Ref Range Status   SARS Coronavirus 2 NEGATIVE NEGATIVE Final    Comment: (NOTE) SARS-CoV-2 target nucleic acids are NOT DETECTED. The SARS-CoV-2 RNA is generally detectable in upper and lower respiratory specimens during the acute phase of infection. Negative results do not preclude SARS-CoV-2  infection, do not rule out co-infections with other pathogens, and should not be used as the sole basis for treatment or other patient management decisions. Negative results must be combined with clinical observations, patient history, and epidemiological information. The expected result is Negative. Fact Sheet for Patients: HairSlick.no Fact Sheet for Healthcare Providers: quierodirigir.com This test is not yet approved or cleared by the [place] FDA and  has been authorized for detection and/or diagnosis of SARS-CoV-2 by FDA under an Emergency Use Authorization (EUA). This EUA will remain  in effect (meaning this test can be used) for the duration of the COVID-19 declaration under Section 56 4(b)(1) of the Act, 21 U.S.C. section 360bbb-3(b)(1), unless the authorization is terminated or revoked sooner. Performed at [facility], [address] [place], [place] [address]   Blood culture (routine x 2)     Status: None (Preliminary result)   Collection Time: 03/31/19 12:44 AM   Specimen: BLOOD LEFT HAND  Result Value Ref Range Status   Specimen Description BLOOD LEFT HAND  Final   Special Requests   Final    BOTTLES DRAWN AEROBIC ONLY Blood Culture results may not be optimal due to an inadequate volume of blood received in culture bottles   Culture   Final    NO GROWTH 2 DAYS Performed at [facility], [address] [place], [place] [address]    Report Status PENDING  Incomplete  Blood culture (routine x 2)     Status: None (Preliminary result)   Collection Time: 03/31/19 12:45 AM   Specimen: BLOOD  Result Value Ref Range Status   Specimen Description BLOOD LEFT ARM  Final   Special Requests   Final    BOTTLES DRAWN AEROBIC ONLY Blood Culture adequate volume   Culture   Final    NO GROWTH 2 DAYS Performed at [facility], [address] [place], [place] [address]    Report Status PENDING   Incomplete  SARS CORONAVIRUS 2 (TAT 6-24 HRS) Nasopharyngeal Nasopharyngeal Swab     Status: None   Collection Time: 03/31/19  2:26 AM   Specimen: Nasopharyngeal Swab  Result Value Ref Range Status   SARS Coronavirus 2 NEGATIVE NEGATIVE Final    Comment: (NOTE) SARS-CoV-2 target nucleic acids are NOT DETECTED. The SARS-CoV-2 RNA is generally detectable in upper and lower respiratory specimens during the acute phase of infection. Negative results do not preclude SARS-CoV-2 infection, do not rule out co-infections with other pathogens, and  should not be used as the sole basis for treatment or other patient management decisions. Negative results must be combined with clinical observations, patient history, and epidemiological information. The expected result is Negative. Fact Sheet for Patients: HairSlick.no Fact Sheet for Healthcare Providers: quierodirigir.com This test is not yet approved or cleared by the [place] FDA and  has been authorized for detection and/or diagnosis of SARS-CoV-2 by FDA under an Emergency Use Authorization (EUA). This EUA will remain  in effect (meaning this test can be used) for the duration of the COVID-19 declaration under Section 56 4(b)(1) of the Act, 21 U.S.C. section 360bbb-3(b)(1), unless the authorization is terminated or revoked sooner. Performed at [facility], [address], [place], [place] [address]   Urine culture     Status: Abnormal   Collection Time: 03/31/19  2:43 AM   Specimen: Urine, Random  Result Value Ref Range Status   Specimen Description URINE, RANDOM  Final   Special Requests NONE  Final   Culture (A)  Final    <10,000 COLONIES/mL INSIGNIFICANT GROWTH Performed at [facility], [address] [place], [place] [address]    Report Status 04/01/2019 FINAL  Final      Radiology Studies: No results found.  Scheduled Meds: . amiodarone  200 mg Oral  Daily  . Chlorhexidine Gluconate Cloth  6 each Topical Daily  . diltiazem  120 mg Oral Daily  . ferrous sulfate  300 mg Oral BID WC  . gabapentin  300 mg Oral TID  . metoprolol tartrate  50 mg Oral BID  . pantoprazole  40 mg Oral Daily  . pravastatin  20 mg Oral q1800  . senna-docusate  2 tablet Oral BID  . sodium chloride flush  10-40 mL Intracatheter Q12H  . sodium chloride flush  3 mL Intravenous Once   Continuous Infusions: . cefTRIAXone (ROCEPHIN)  IV 2 g (04/02/19 0350)     LOS: 2 days   Time spent: 27 minutes  Hughie Closs, MD [facility]  04/02/2019, 1:10 PM   To contact the attending provider between 7A-7P or the covering provider during after hours 7P-7A, please log into the web site [email or web address] and use password [email or web address].

## 2019-04-03 ENCOUNTER — Other Ambulatory Visit: Payer: Self-pay | Admitting: Physician Assistant

## 2019-04-03 LAB — CBC WITH DIFFERENTIAL/PLATELET
Abs Immature Granulocytes: 0.05 10*3/uL (ref 0.00–0.07)
Basophils Absolute: 0 10*3/uL (ref 0.0–0.1)
Basophils Relative: 0 %
Eosinophils Absolute: 0.1 10*3/uL (ref 0.0–0.5)
Eosinophils Relative: 1 %
HCT: 28 % — ABNORMAL LOW (ref 36.0–46.0)
Hemoglobin: 8.8 g/dL — ABNORMAL LOW (ref 12.0–15.0)
Immature Granulocytes: 1 %
Lymphocytes Relative: 30 %
Lymphs Abs: 1.9 10*3/uL (ref 0.7–4.0)
MCH: 30 pg (ref 26.0–34.0)
MCHC: 31.4 g/dL (ref 30.0–36.0)
MCV: 95.6 fL (ref 80.0–100.0)
Monocytes Absolute: 0.9 10*3/uL (ref 0.1–1.0)
Monocytes Relative: 14 %
Neutro Abs: 3.5 10*3/uL (ref 1.7–7.7)
Neutrophils Relative %: 54 %
Platelets: 248 10*3/uL (ref 150–400)
RBC: 2.93 MIL/uL — ABNORMAL LOW (ref 3.87–5.11)
RDW: 15.5 % (ref 11.5–15.5)
WBC: 6.6 10*3/uL (ref 4.0–10.5)
nRBC: 0 % (ref 0.0–0.2)

## 2019-04-03 LAB — BASIC METABOLIC PANEL
Anion gap: 8 (ref 5–15)
BUN: 12 mg/dL (ref 8–23)
CO2: 27 mmol/L (ref 22–32)
Calcium: 8 mg/dL — ABNORMAL LOW (ref 8.9–10.3)
Chloride: 103 mmol/L (ref 98–111)
Creatinine, Ser: 0.93 mg/dL (ref 0.44–1.00)
GFR calc Af Amer: >60 mL/min (ref >60)
GFR calc non Af Amer: 58 mL/min — ABNORMAL LOW (ref >60)
Glucose, Bld: 91 mg/dL (ref 70–99)
Potassium: 3.4 mmol/L — ABNORMAL LOW (ref 3.5–5.1)
Sodium: 138 mmol/L (ref 135–145)

## 2019-04-03 LAB — MAGNESIUM: Magnesium: 1.7 mg/dL (ref 1.7–2.4)

## 2019-04-03 MED ORDER — POTASSIUM CHLORIDE CRYS ER 20 MEQ PO TBCR
40.0000 meq | EXTENDED_RELEASE_TABLET | Freq: Once | ORAL | Status: AC
  Administered 2019-04-03: 40 meq via ORAL
  Filled 2019-04-03: qty 2

## 2019-04-03 NOTE — Progress Notes (Signed)
Reviewed notes from weekend.   Dressing in place. Discussed surgery with patient including outcomes recovery and expectations. Will plan for R AKA Revision on Wednesday.

## 2019-04-03 NOTE — Progress Notes (Signed)
PROGRESS NOTE    Robin Snow  WJX:[id number] DOB: Aug 27, 1940 DOA: 03/31/2019 PCP: [name], MD   Brief Narrative:  HPI: Robin Snow is a 78 y.o. female with medical history significant of a.fib, HTN, CKD, stroke.  Patient had R TKA in [place] in 2019.  Patient recently admitted from 10/30-12/23 for dislocation of R knee prosthesis with septic joint.  Closed reduction by Dr. Jena Gauss 11/18.  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  Ultimately underwent R AKA by Dr. Lajoyce Corners.  This complicated by sepsis from E.Coli bacteremia, spent several days in ICU on vent.  Also complicated by AKA wound dehissance.  Patient finally discharged 12/23 to [facility] health care on rocephin to complete 6 weeks of therapy with end date of 1/6.  For a full recounting of the prolonged, nearly 2 month hospital stay, please see Dr. Dennison Nancy discharge summary on 12/23.  Interm history:  Per SNF: patient didn't get rocephin yesterday (12/24).  At around 7pm they noticed asmall amount ofbloodydrainage from dressingand noticedfoulodor from wound. At 745pm noticed dressing was saturated and at 9pm now oozing blood around dressing.  Patient was noted to be running fever of 100.7 at facility.  Facility confirms that coumadin is still on hold as per DC summary plan.  Per patient she has somewhat increased pain today compared to a couple of days ago in the leg.  Though pain isnt severe she states.   ED Course: No fever in ED, lab work actually looks fairly stable compared to 2 days ago.  CT of RLE femur is worrisome for hematoma with probable superimposed infection extending from amputation site.  EDP ordered rocephin + Vanc.   Assessment & Plan:   Principal Problem:   Wound dehiscence Active Problems:   Essential hypertension   AF (paroxysmal atrial fibrillation) (HCC)   Bacteremia due to Escherichia coli   S/P AKA (above  knee amputation), right (HCC)   Wound dehiscence/infection of amputation site/right stump: Dressing in place.  Wound care on board.  Seen by orthopedics Dr. Roda Shutters who has mentioned in his note that no surgical intervention is needed urgently and that Dr. Lajoyce Corners will likely plan on I&D and possible revision AKA on Wednesday. Patient needed to be re dressed yesterday by orthopedics. We will continue antibiotics per his recommendations.  Management deferred to orthopedics.  E. coli bacteremia/endocarditis: Continue Rocephin 2 g IV daily until 04/11/2018 per ID recommendations from recent hospitalization.  Permanent atrial fibrillation: Rates fluctuating but mostly controlled without symptoms.  Continue home dose of metoprolol, Cardizem and amiodarone.  Coumadin on hold due to hematoma at this time. Will resume anticoagulation once cleared by orthopedics.  Essential hypertension: Controlled.  Continue amiodarone, diltiazem and metoprolol.  DVT prophylaxis: SCD Code Status: Full code Family Communication: No family member present at the bedside. Disposition Plan: To be determined based on further Ortho plans.  Estimated body mass index is 35.22 kg/m as calculated from the following:   Height as of this encounter:  (1.702 m).   Weight as of this encounter: 102 kg.  Pressure Injury 03/01/19 Buttocks Medial Stage II -  Partial thickness loss of dermis presenting as a shallow open ulcer with a red, pink wound bed without slough. (Active)  03/01/19 1732  Location: Buttocks  Location Orientation: Medial  Staging: Stage II -  Partial thickness loss of dermis presenting as a shallow open ulcer with a red, pink wound bed without slough.  Wound  Description (Comments):   Present on Admission: Yes     Pressure Injury 03/03/19 Thigh Right Stage II -  Partial thickness loss of dermis presenting as a shallow open ulcer with a red, pink wound bed without slough. (Active)  03/03/19 1000  Location: Thigh    Location Orientation: Right  Staging: Stage II -  Partial thickness loss of dermis presenting as a shallow open ulcer with a red, pink wound bed without slough.  Wound Description (Comments):   Present on Admission:      Nutritional status:               Consultants:   Orthopedics  Procedures:   None  Antimicrobials:   Rocephin   Subjective: Seen and examined. She was little sleepy today. No complaints.  Objective: Vitals:   04/02/19 1950 04/03/19 0545 04/03/19 0806 04/03/19 1330  BP: 118/71 (!) 110/58 111/61 128/61  Pulse: (!) 59 (!) 59 (!) 59 60  Resp:   18 15  Temp: 98.6 F (37 C) 98.6 F (37 C) 98.3 F (36.8 C) 99.2 F (37.3 C)  TempSrc: Oral Oral Oral Oral  SpO2: 97% 95% 100% 97%  Weight:      Height:        Intake/Output Summary (Last 24 hours) at 04/03/2019 1503 Last data filed at 04/03/2019 0900 Gross per 24 hour  Intake 120 ml  Output --  Net 120 ml   Filed Weights   04/01/19 1023  Weight: 102 kg    Examination: General exam: Appears calm and comfortable  Respiratory system: Decreased breath sounds at the bases. Respiratory effort normal. Cardiovascular system: S1 & S2 heard, RRR. No JVD, murmurs, rubs, gallops or clicks. No pedal edema. Gastrointestinal system: Abdomen is nondistended, soft and nontender. No organomegaly or masses felt. Normal bowel sounds heard. Central nervous system: Alert and oriented. No focal neurological deficits. Extremities: Symmetric 5 x 5 power. Right BKA. Wound dressing in place. Direct visualization deferred to orthopedics. Skin: No rashes, lesions or ulcers.  Psychiatry: Judgement and insight appear poor, mood & affect flat.   Data Reviewed: I have personally reviewed following labs and imaging studies  CBC: Recent Labs  Lab 03/29/19 0509 03/31/19 0044 03/31/19 0708 04/01/19 1517 04/02/19 0850 04/03/19 0312  WBC 7.5 8.6  --  6.5 6.8 6.6  NEUTROABS  --  3.9  --  3.2 3.1 3.5  HGB 8.6* 9.3*  9.9* 8.7* 8.7* 8.8*  HCT 28.1* 30.0* 30.2* 27.8* 28.1* 28.0*  MCV 98.9 98.0  --  94.9 96.6 95.6  PLT 216 220  --  238 258 093   Basic Metabolic Panel: Recent Labs  Lab 03/31/19 0044 04/01/19 1517 04/02/19 0850 04/03/19 0312 04/03/19 0922  NA 137 138 140 138  --   K 4.2 3.3* 3.4* 3.4*  --   CL 103 102 103 103  --   CO2 25 28 30 27   --   GLUCOSE 102* 103* 84 91  --   BUN 13 13 12 12   --   CREATININE 0.80 0.89 0.96 0.93  --   CALCIUM 8.3* 8.0* 8.2* 8.0*  --   MG  --   --   --   --  1.7   GFR: Estimated Creatinine Clearance: 60.2 mL/min (by C-G formula based on SCr of 0.93 mg/dL). Liver Function Tests: Recent Labs  Lab 03/31/19 0044  AST 27  ALT 22  ALKPHOS 118  BILITOT 1.3*  PROT 5.9*  ALBUMIN 2.1*   No results  for input(s): LIPASE, AMYLASE in the last 168 hours. No results for input(s): AMMONIA in the last 168 hours. Coagulation Profile: Recent Labs  Lab 03/28/19 0335 03/29/19 0509 03/31/19 0044  INR 1.2 1.2 1.2   Cardiac Enzymes: No results for input(s): CKTOTAL, CKMB, CKMBINDEX, TROPONINI in the last 168 hours. BNP (last 3 results) No results for input(s): PROBNP in the last 8760 hours. HbA1C: No results for input(s): HGBA1C in the last 72 hours. CBG: No results for input(s): GLUCAP in the last 168 hours. Lipid Profile: No results for input(s): CHOL, HDL, LDLCALC, TRIG, CHOLHDL, LDLDIRECT in the last 72 hours. Thyroid Function Tests: No results for input(s): TSH, T4TOTAL, FREET4, T3FREE, THYROIDAB in the last 72 hours. Anemia Panel: No results for input(s): VITAMINB12, FOLATE, FERRITIN, TIBC, IRON, RETICCTPCT in the last 72 hours. Sepsis Labs: Recent Labs  Lab 03/31/19 0044  LATICACIDVEN 1.3    Recent Results (from the past 240 hour(s))  SARS CORONAVIRUS 2 (TAT 6-24 HRS) Nasopharyngeal Nasopharyngeal Swab     Status: None   Collection Time: 03/28/19  3:00 PM   Specimen: Nasopharyngeal Swab  Result Value Ref Range Status   SARS Coronavirus 2  NEGATIVE NEGATIVE Final    Comment: (NOTE) SARS-CoV-2 target nucleic acids are NOT DETECTED. The SARS-CoV-2 RNA is generally detectable in upper and lower respiratory specimens during the acute phase of infection. Negative results do not preclude SARS-CoV-2 infection, do not rule out co-infections with other pathogens, and should not be used as the sole basis for treatment or other patient management decisions. Negative results must be combined with clinical observations, patient history, and epidemiological information. The expected result is Negative. Fact Sheet for Patients: HairSlick.nohttps://www.fda.gov/media/138098/download Fact Sheet for Healthcare Providers: quierodirigir.comhttps://www.fda.gov/media/138095/download This test is not yet approved or cleared by the [place] FDA and  has been authorized for detection and/or diagnosis of SARS-CoV-2 by FDA under an Emergency Use Authorization (EUA). This EUA will remain  in effect (meaning this test can be used) for the duration of the COVID-19 declaration under Section 56 4(b)(1) of the Act, 21 U.S.C. section 360bbb-3(b)(1), unless the authorization is terminated or revoked sooner. Performed at [facility], [address] [place], [place] [address]   Blood culture (routine x 2)     Status: None (Preliminary result)   Collection Time: 03/31/19 12:44 AM   Specimen: BLOOD LEFT HAND  Result Value Ref Range Status   Specimen Description BLOOD LEFT HAND  Final   Special Requests   Final    BOTTLES DRAWN AEROBIC ONLY Blood Culture results may not be optimal due to an inadequate volume of blood received in culture bottles   Culture   Final    NO GROWTH 3 DAYS Performed at [facility], [address] [place], [place] [address]    Report Status PENDING  Incomplete  Blood culture (routine x 2)     Status: None (Preliminary result)   Collection Time: 03/31/19 12:45 AM   Specimen: BLOOD  Result Value Ref Range Status   Specimen Description  BLOOD LEFT ARM  Final   Special Requests   Final    BOTTLES DRAWN AEROBIC ONLY Blood Culture adequate volume   Culture   Final    NO GROWTH 3 DAYS Performed at [facility], [address] [place], [place] [address]    Report Status PENDING  Incomplete  SARS CORONAVIRUS 2 (TAT 6-24 HRS) Nasopharyngeal Nasopharyngeal Swab     Status: None   Collection  Time: 03/31/19  2:26 AM   Specimen: Nasopharyngeal Swab  Result Value Ref Range Status   SARS Coronavirus 2 NEGATIVE NEGATIVE Final    Comment: (NOTE) SARS-CoV-2 target nucleic acids are NOT DETECTED. The SARS-CoV-2 RNA is generally detectable in upper and lower respiratory specimens during the acute phase of infection. Negative results do not preclude SARS-CoV-2 infection, do not rule out co-infections with other pathogens, and should not be used as the sole basis for treatment or other patient management decisions. Negative results must be combined with clinical observations, patient history, and epidemiological information. The expected result is Negative. Fact Sheet for Patients: HairSlick.no Fact Sheet for Healthcare Providers: quierodirigir.com This test is not yet approved or cleared by the [place] FDA and  has been authorized for detection and/or diagnosis of SARS-CoV-2 by FDA under an Emergency Use Authorization (EUA). This EUA will remain  in effect (meaning this test can be used) for the duration of the COVID-19 declaration under Section 56 4(b)(1) of the Act, 21 U.S.C. section 360bbb-3(b)(1), unless the authorization is terminated or revoked sooner. Performed at [facility], [address], [place], [place] [address]   Urine culture     Status: Abnormal   Collection Time: 03/31/19  2:43 AM   Specimen: Urine, Random  Result Value Ref Range Status   Specimen Description URINE, RANDOM  Final   Special Requests NONE  Final   Culture (A)  Final     <10,000 COLONIES/mL INSIGNIFICANT GROWTH Performed at [facility], [address], [place], [place] [address]    Report Status 04/01/2019 FINAL  Final      Radiology Studies: No results found.  Scheduled Meds: . amiodarone  200 mg Oral Daily  . Chlorhexidine Gluconate Cloth  6 each Topical Daily  . diltiazem  120 mg Oral Daily  . ferrous sulfate  300 mg Oral BID WC  . gabapentin  300 mg Oral TID  . metoprolol tartrate  50 mg Oral BID  . pantoprazole  40 mg Oral Daily  . pravastatin  20 mg Oral q1800  . senna-docusate  2 tablet Oral BID  . sodium chloride flush  10-40 mL Intracatheter Q12H  . sodium chloride flush  3 mL Intravenous Once   Continuous Infusions: . cefTRIAXone (ROCEPHIN)  IV 2 g (04/03/19 0433)     LOS: 3 days   Time spent: 26 minutes  Hughie Closs, MD [facility]  04/03/2019, 3:03 PM   To contact the attending provider between 7A-7P or the covering provider during after hours 7P-7A, please log into the web site [email or web address] and use password [email or web address].

## 2019-04-03 NOTE — Plan of Care (Signed)

## 2019-04-03 NOTE — Plan of Care (Signed)
  Problem: Pain Managment: Goal: General experience of comfort will improve Outcome: Progressing   Problem: Safety: Goal: Ability to remain free from injury will improve Outcome: Progressing   Problem: Skin Integrity: Goal: Risk for impaired skin integrity will decrease Outcome: Progressing   

## 2019-04-04 ENCOUNTER — Other Ambulatory Visit: Payer: Self-pay | Admitting: Physician Assistant

## 2019-04-04 DIAGNOSIS — L03115 Cellulitis of right lower limb: Secondary | ICD-10-CM | POA: Insufficient documentation

## 2019-04-04 LAB — TYPE AND SCREEN
ABO/RH(D): A POS
Antibody Screen: POSITIVE
Donor AG Type: NEGATIVE
Donor AG Type: NEGATIVE
Unit division: 0
Unit division: 0

## 2019-04-04 LAB — CBC WITH DIFFERENTIAL/PLATELET
Abs Immature Granulocytes: 0.06 10*3/uL (ref 0.00–0.07)
Basophils Absolute: 0 10*3/uL (ref 0.0–0.1)
Basophils Relative: 1 %
Eosinophils Absolute: 0.1 10*3/uL (ref 0.0–0.5)
Eosinophils Relative: 1 %
HCT: 29 % — ABNORMAL LOW (ref 36.0–46.0)
Hemoglobin: 9.2 g/dL — ABNORMAL LOW (ref 12.0–15.0)
Immature Granulocytes: 1 %
Lymphocytes Relative: 35 %
Lymphs Abs: 2.2 10*3/uL (ref 0.7–4.0)
MCH: 30.2 pg (ref 26.0–34.0)
MCHC: 31.7 g/dL (ref 30.0–36.0)
MCV: 95.1 fL (ref 80.0–100.0)
Monocytes Absolute: 0.9 10*3/uL (ref 0.1–1.0)
Monocytes Relative: 14 %
Neutro Abs: 3 10*3/uL (ref 1.7–7.7)
Neutrophils Relative %: 48 %
Platelets: 251 10*3/uL (ref 150–400)
RBC: 3.05 MIL/uL — ABNORMAL LOW (ref 3.87–5.11)
RDW: 15.4 % (ref 11.5–15.5)
WBC: 6.3 10*3/uL (ref 4.0–10.5)
nRBC: 0 % (ref 0.0–0.2)

## 2019-04-04 LAB — BASIC METABOLIC PANEL
Anion gap: 9 (ref 5–15)
BUN: 12 mg/dL (ref 8–23)
CO2: 27 mmol/L (ref 22–32)
Calcium: 8.3 mg/dL — ABNORMAL LOW (ref 8.9–10.3)
Chloride: 103 mmol/L (ref 98–111)
Creatinine, Ser: 0.86 mg/dL (ref 0.44–1.00)
GFR calc Af Amer: >60 mL/min (ref >60)
GFR calc non Af Amer: >60 mL/min (ref >60)
Glucose, Bld: 92 mg/dL (ref 70–99)
Potassium: 3.9 mmol/L (ref 3.5–5.1)
Sodium: 139 mmol/L (ref 135–145)

## 2019-04-04 LAB — BPAM RBC
Blood Product Expiration Date: 202101152359
Blood Product Expiration Date: 202101152359
ISSUE DATE / TIME: 202012121713
ISSUE DATE / TIME: 202012121713
Unit Type and Rh: 6200
Unit Type and Rh: 6200

## 2019-04-04 MED ORDER — CHLORHEXIDINE GLUCONATE 4 % EX LIQD
60.0000 mL | Freq: Once | CUTANEOUS | Status: AC
  Administered 2019-04-04: 4 via TOPICAL

## 2019-04-04 NOTE — Progress Notes (Addendum)
PROGRESS NOTE    Robin DutyBarbara P Hurwitz  YQM:[id number] DOB: 1940/06/22 DOA: 03/31/2019 PCP: Myrlene Brokerrawford, Elizabeth A, MD   Brief Narrative:  Robin Snow is a 78 y.o. female with medical history significant of a.fib, HTN, CKD, stroke. Patient had R TKA in [place] in 2019.  Patient recently admitted from 10/30-12/23 for dislocation of R knee prosthesis with septic joint.  Closed reduction by Dr. Jena GaussHaddix 11/18. 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 Ultimately underwent R AKA by Dr. Lajoyce Cornersuda. This complicated by sepsis from E.Coli bacteremia, spent several days in ICU on vent.  Also complicated by AKA wound dehissance.Patient finally discharged 12/23 to [facility] on rocephin to complete 6 weeks of therapy with end date of 1/6.   For a full recounting of the prolonged, nearly 2 month hospital stay, please see Dr. Dennison NancyNettey's discharge summary on 12/23.  Per SNF: patient didn't get rocephin on (12/24).  At around 7pm they noticed asmall amount ofbloodydrainage from dressingand noticedfoulodor from wound. At 745pm noticed dressing was saturated and at 9pm now oozing blood around dressing. Patient was noted to be running fever of 100.7 at facility. Facility confirms that coumadin is still on hold as per DC summary plan.  ED Course: No fever in ED, lab work actually looks fairly stable compared to 2 days ago.  CT of RLE femur is worrisome for hematoma with probable superimposed infection extending from amputation site.  She was admitted under hospital service with Ortho to consult.  She was given Rocephin and vancomycin in ED.  She is scheduled to have right hip disarticulation with Dr. Lajoyce Cornersuda tomorrow.  Assessment & Plan:   Principal Problem:   Wound dehiscence Active Problems:   Essential hypertension   AF (paroxysmal atrial fibrillation) (HCC)   Bacteremia due to Escherichia coli   S/P AKA (above knee amputation), right (HCC)   Cellulitis  of right lower extremity   Wound dehiscence/infection of amputation site/right stump: Dressing in place.  Wound care on board.  Per Dr. Audrie Liauda's consultation note today, patient is scheduled to have right hip disarticulation tomorrow.  E. coli bacteremia/endocarditis: Continue Rocephin 2 g IV daily until 04/11/2018 per ID recommendations from recent hospitalization.  Permanent atrial fibrillation: Rates fluctuating but mostly controlled without symptoms.  Continue home dose of metoprolol, Cardizem and amiodarone.  Coumadin on hold due to hematoma at this time. Will resume anticoagulation once cleared by orthopedics.  Essential hypertension: Controlled.  Continue amiodarone, diltiazem and metoprolol.  DVT prophylaxis: SCD Code Status: Full code Family Communication: No family member present at the bedside. Disposition Plan: To be determined based on further Ortho plans.  Estimated body mass index is 35.22 kg/m as calculated from the following:   Height as of this encounter: 5\' 7"  (1.702 m).   Weight as of this encounter: 102 kg.  Pressure Injury 03/01/19 Buttocks Medial Stage II -  Partial thickness loss of dermis presenting as a shallow open ulcer with a red, pink wound bed without slough. (Active)  03/01/19 1732  Location: Buttocks  Location Orientation: Medial  Staging: Stage II -  Partial thickness loss of dermis presenting as a shallow open ulcer with a red, pink wound bed without slough.  Wound Description (Comments):   Present on Admission: Yes     Pressure Injury 03/03/19 Thigh Right Stage II -  Partial thickness loss of dermis presenting as a shallow open ulcer with a red, pink wound bed without slough. (Active)  03/03/19 1000  Location: Thigh  Location Orientation: Right  Staging: Stage II -  Partial thickness loss of dermis presenting as a shallow open ulcer with a red, pink wound bed without slough.  Wound Description (Comments):   Present on Admission:      Nutritional  status:               Consultants:   Orthopedics  Procedures:   None  Antimicrobials:   Rocephin   Subjective: Seen and examined.  No family members present at bedside.  She has no complaints but she was slightly lethargic.  There was foul smell in the room likely coming from her right distal wound.  Objective: Vitals:   04/04/19 0336 04/04/19 0646 04/04/19 0818 04/04/19 1254  BP: 128/69  129/60 139/90  Pulse: 60  60 95  Resp: 18  18 18   Temp: 100 F (37.8 C) 98.6 F (37 C) 98.5 F (36.9 C) 98.9 F (37.2 C)  TempSrc: Oral Oral Oral Oral  SpO2: 98%  96% 100%  Weight:      Height:        Intake/Output Summary (Last 24 hours) at 04/04/2019 1422 Last data filed at 04/04/2019 1348 Gross per 24 hour  Intake 850 ml  Output 1000 ml  Net -150 ml   Filed Weights   04/01/19 1023  Weight: 102 kg    Examination:  General exam: Appears calm and comfortable, obese Respiratory system: Clear to auscultation. Respiratory effort normal. Cardiovascular system: S1 & S2 heard, RRR. No JVD, murmurs, rubs, gallops or clicks. No pedal edema. Gastrointestinal system: Abdomen is nondistended, soft and nontender. No organomegaly or masses felt. Normal bowel sounds heard. Central nervous system: Alert and oriented. No focal neurological deficits. Extremities: Right distal wound dressing in place which was soaked in blood and some discharge. Skin: No rashes, lesions or ulcers.   Data Reviewed: I have personally reviewed following labs and imaging studies  CBC: Recent Labs  Lab 03/31/19 0044 03/31/19 0708 04/01/19 1517 04/02/19 0850 04/03/19 0312 04/04/19 0422  WBC 8.6  --  6.5 6.8 6.6 6.3  NEUTROABS 3.9  --  3.2 3.1 3.5 3.0  HGB 9.3* 9.9* 8.7* 8.7* 8.8* 9.2*  HCT 30.0* 30.2* 27.8* 28.1* 28.0* 29.0*  MCV 98.0  --  94.9 96.6 95.6 95.1  PLT 220  --  238 258 248 251   Basic Metabolic Panel: Recent Labs  Lab 03/31/19 0044 04/01/19 1517 04/02/19 0850  04/03/19 0312 04/03/19 0922 04/04/19 0422  NA 137 138 140 138  --  139  K 4.2 3.3* 3.4* 3.4*  --  3.9  CL 103 102 103 103  --  103  CO2 25 28 30 27   --  27  GLUCOSE 102* 103* 84 91  --  92  BUN 13 13 12 12   --  12  CREATININE 0.80 0.89 0.96 0.93  --  0.86  CALCIUM 8.3* 8.0* 8.2* 8.0*  --  8.3*  MG  --   --   --   --  1.7  --    GFR: Estimated Creatinine Clearance: 65.1 mL/min (by C-G formula based on SCr of 0.86 mg/dL). Liver Function Tests: Recent Labs  Lab 03/31/19 0044  AST 27  ALT 22  ALKPHOS 118  BILITOT 1.3*  PROT 5.9*  ALBUMIN 2.1*   No results for input(s): LIPASE, AMYLASE in the last 168 hours. No results for input(s): AMMONIA in the last 168 hours. Coagulation Profile: Recent Labs  Lab 03/29/19 0509 03/31/19  0044  INR 1.2 1.2   Cardiac Enzymes: No results for input(s): CKTOTAL, CKMB, CKMBINDEX, TROPONINI in the last 168 hours. BNP (last 3 results) No results for input(s): PROBNP in the last 8760 hours. HbA1C: No results for input(s): HGBA1C in the last 72 hours. CBG: No results for input(s): GLUCAP in the last 168 hours. Lipid Profile: No results for input(s): CHOL, HDL, LDLCALC, TRIG, CHOLHDL, LDLDIRECT in the last 72 hours. Thyroid Function Tests: No results for input(s): TSH, T4TOTAL, FREET4, T3FREE, THYROIDAB in the last 72 hours. Anemia Panel: No results for input(s): VITAMINB12, FOLATE, FERRITIN, TIBC, IRON, RETICCTPCT in the last 72 hours. Sepsis Labs: Recent Labs  Lab 03/31/19 0044  LATICACIDVEN 1.3    Recent Results (from the past 240 hour(s))  SARS CORONAVIRUS 2 (TAT 6-24 HRS) Nasopharyngeal Nasopharyngeal Swab     Status: None   Collection Time: 03/28/19  3:00 PM   Specimen: Nasopharyngeal Swab  Result Value Ref Range Status   SARS Coronavirus 2 NEGATIVE NEGATIVE Final    Comment: (NOTE) SARS-CoV-2 target nucleic acids are NOT DETECTED. The SARS-CoV-2 RNA is generally detectable in upper and lower respiratory specimens during the  acute phase of infection. Negative results do not preclude SARS-CoV-2 infection, do not rule out co-infections with other pathogens, and should not be used as the sole basis for treatment or other patient management decisions. Negative results must be combined with clinical observations, patient history, and epidemiological information. The expected result is Negative. Fact Sheet for Patients: HairSlick.no Fact Sheet for Healthcare Providers: quierodirigir.com This test is not yet approved or cleared by the [place] FDA and  has been authorized for detection and/or diagnosis of SARS-CoV-2 by FDA under an Emergency Use Authorization (EUA). This EUA will remain  in effect (meaning this test can be used) for the duration of the COVID-19 declaration under Section 56 4(b)(1) of the Act, 21 U.S.C. section 360bbb-3(b)(1), unless the authorization is terminated or revoked sooner. Performed at [facility], [address], [place], [place] [address]   Blood culture (routine x 2)     Status: None (Preliminary result)   Collection Time: 03/31/19 12:44 AM   Specimen: BLOOD LEFT HAND  Result Value Ref Range Status   Specimen Description BLOOD LEFT HAND  Final   Special Requests   Final    BOTTLES DRAWN AEROBIC ONLY Blood Culture results may not be optimal due to an inadequate volume of blood received in culture bottles   Culture   Final    NO GROWTH 4 DAYS Performed at [facility], [address] [place], [place] [address]    Report Status PENDING  Incomplete  Blood culture (routine x 2)     Status: None (Preliminary result)   Collection Time: 03/31/19 12:45 AM   Specimen: BLOOD  Result Value Ref Range Status   Specimen Description BLOOD LEFT ARM  Final   Special Requests   Final    BOTTLES DRAWN AEROBIC ONLY Blood Culture adequate volume   Culture   Final    NO GROWTH 4 DAYS Performed at [facility],  [address], [place], [place] [address]    Report Status PENDING  Incomplete  SARS CORONAVIRUS 2 (TAT 6-24 HRS) Nasopharyngeal Nasopharyngeal Swab     Status: None   Collection Time: 03/31/19  2:26 AM   Specimen: Nasopharyngeal Swab  Result Value Ref Range Status   SARS Coronavirus 2 NEGATIVE NEGATIVE Final    Comment: (NOTE) SARS-CoV-2 target  nucleic acids are NOT DETECTED. The SARS-CoV-2 RNA is generally detectable in upper and lower respiratory specimens during the acute phase of infection. Negative results do not preclude SARS-CoV-2 infection, do not rule out co-infections with other pathogens, and should not be used as the sole basis for treatment or other patient management decisions. Negative results must be combined with clinical observations, patient history, and epidemiological information. The expected result is Negative. Fact Sheet for Patients: SugarRoll.be Fact Sheet for Healthcare Providers: https://www.woods-mathews.com/ This test is not yet approved or cleared by the [place] FDA and  has been authorized for detection and/or diagnosis of SARS-CoV-2 by FDA under an Emergency Use Authorization (EUA). This EUA will remain  in effect (meaning this test can be used) for the duration of the COVID-19 declaration under Section 56 4(b)(1) of the Act, 21 U.S.C. section 360bbb-3(b)(1), unless the authorization is terminated or revoked sooner. Performed at [facility], [place] [address] [place], [place] [address]   Urine culture     Status: Abnormal   Collection Time: 03/31/19  2:43 AM   Specimen: Urine, Random  Result Value Ref Range Status   Specimen Description URINE, RANDOM  Final   Special Requests NONE  Final   Culture (A)  Final    <10,000 COLONIES/mL INSIGNIFICANT GROWTH Performed at [facility], [place] [address], [place], [place] [address]    Report Status 04/01/2019 FINAL  Final      Radiology  Studies: No results found.  Scheduled Meds: . amiodarone  200 mg Oral Daily  . Chlorhexidine Gluconate Cloth  6 each Topical Daily  . diltiazem  120 mg Oral Daily  . ferrous sulfate  300 mg Oral BID WC  . gabapentin  300 mg Oral TID  . metoprolol tartrate  50 mg Oral BID  . pantoprazole  40 mg Oral Daily  . pravastatin  20 mg Oral q1800  . senna-docusate  2 tablet Oral BID  . sodium chloride flush  10-40 mL Intracatheter Q12H  . sodium chloride flush  3 mL Intravenous Once   Continuous Infusions: . cefTRIAXone (ROCEPHIN)  IV 2 g (04/04/19 0301)     LOS: 4 days   Time spent: 27 minutes  Darliss Cheney, MD [facility]  04/04/2019, 2:22 PM   To contact the attending provider between 7A-7P or the covering provider during after hours 7P-7A, please log into the web site [email or web address] and use password [email or web address].

## 2019-04-04 NOTE — Plan of Care (Signed)
  Problem: Education: Goal: Knowledge of the prescribed therapeutic regimen will improve Outcome: Progressing   Problem: Pain Managment: Goal: General experience of comfort will improve Outcome: Progressing   

## 2019-04-04 NOTE — Consult Note (Signed)
  ORTHOPAEDIC CONSULTATION  REQUESTING PHYSICIAN: Pahwani, Ravi, MD  Chief Complaint: Dehiscence right above-the-knee amputation.  HPI: Robin Snow is a 78 y.o. female who presents with dehiscence right above-the-knee amputation.  Has a history renal disease type 2 diabetes and severe protein caloric malnutrition as well as history of tobacco use.  Past Medical History:  Diagnosis Date  . AKI (acute kidney injury) (HCC) 04/2016  . Anemia   . Arthritis   . Chronic atrial fibrillation (HCC)   . Chronic edema   . Chronic venous insufficiency   . CKD (chronic kidney disease), stage III   . Coagulopathy (HCC)   . Diabetes mellitus    Type 2  . Diverticulosis   . Dyslipidemia   . GERD (gastroesophageal reflux disease)   . GI bleed 2015   a. lower GI from diverticular source 2015.  . Hx of transfusion of packed red blood cells   . Hypertension   . Morbid obesity (HCC)   . Obstructive sleep apnea    on CPAP  . Pulmonary hypertension (HCC)   . Pyelonephritis 04/2016  . Renal infarct (HCC)    a. 04/2016- adm with flank pain, ? pyelo vs renal infarct in setting of recent subtherapeutic INR.  . Sinus bradycardia   . Stroke (HCC) 2015   Past Surgical History:  Procedure Laterality Date  . AMPUTATION Right 03/01/2019   Procedure: AMPUTATION ABOVE KNEE;  Surgeon: Nailah Luepke V, MD;  Location: [facility] OR;  Service: Orthopedics;  Laterality: Right;  . CESAREAN SECTION    . COLONOSCOPY Left 01/24/2013   Procedure: COLONOSCOPY;  Surgeon: William Outlaw, MD;  Location: [facility] ENDOSCOPY;  Service: Endoscopy;  Laterality: Left;  . ESOPHAGOGASTRODUODENOSCOPY Left 01/23/2013   Procedure: ESOPHAGOGASTRODUODENOSCOPY (EGD);  Surgeon: William Outlaw, MD;  Location: [facility] ENDOSCOPY;  Service: Endoscopy;  Laterality: Left;  . EYE SURGERY Bilateral    cataract surgery  . JOINT REPLACEMENT    . KNEE CLOSED REDUCTION Right 02/22/2019   Procedure: CLOSED MANIPULATION KNEE;  Surgeon: Haddix, Kevin P,  MD;  Location: [facility] OR;  Service: Orthopedics;  Laterality: Right;  . PACEMAKER IMPLANT [name] 09/09/2016   Procedure: Pacemaker Implant;  Surgeon: Taylor, Gregg W, MD;  Location: [facility] INVASIVE CV LAB;  Service: Cardiovascular;  Laterality: [name];  . REPLACEMENT TOTAL KNEE BILATERAL    . TUBAL LIGATION     Social History   Socioeconomic History  . Marital status: Widowed    Spouse name: Not on file  . Number of children: 4  . Years of education: 12  . Highest education level: Not on file  Occupational History  . Not on file  Tobacco Use  . Smoking status: Former Smoker    Packs/day: 0.12    Years: 34.00    Pack years: 4.08    Types: Cigarettes    Quit date: 07/07/1993    Years since quitting: 25.7  . Smokeless tobacco: Never Used  Substance and Sexual Activity  . Alcohol use: No    Comment: no longer drinks alcohol  . Drug use: No  . Sexual activity: Never  Other Topics Concern  . Not on file  Social History Narrative   Patient is widowed and has 4 living children, and 2 deceased children.   Patient is right handed.   Patient has hs education.   Patient drinks coffee and soda once a week.   Social Determinants of Health   Financial Resource Strain:   . Difficulty of Paying Living Expenses: Not on   file  Food Insecurity:   . Worried About Running Out of Food in the Last Year: Not on file  . Ran Out of Food in the Last Year: Not on file  Transportation Needs:   . Lack of Transportation (Medical): Not on file  . Lack of Transportation (Non-Medical): Not on file  Physical Activity:   . Days of Exercise per Week: Not on file  . Minutes of Exercise per Session: Not on file  Stress:   . Feeling of Stress : Not on file  Social Connections:   . Frequency of Communication with Friends and Family: Not on file  . Frequency of Social Gatherings with Friends and Family: Not on file  . Attends Religious Services: Not on file  . Active Member of Clubs or Organizations: Not on file  .  Attends Club or Organization Meetings: Not on file  . Marital Status: Not on file   Family History  Problem Relation Age of Onset  . Cancer Father   . Stroke Maternal Aunt    - negative except otherwise stated in the family history section Allergies  Allergen Reactions  . Aspirin Hives and Swelling    Angioedema   . Rofecoxib Hives  . Hydrocodone Hives    Tolerates oxycodone and tramadol   Prior to Admission medications   Medication Sig Start Date End Date Taking? Authorizing Provider  amiodarone (PACERONE) 200 MG tablet Take 1 tablet (200 mg total) by mouth daily. 03/30/19  Yes Nettey, Shayla D, MD  cefTRIAXone (ROCEPHIN) IVPB Inject 2 g into the vein daily. Indication:  E Coli Endocarditis Last Day of Therapy:  04/12/2019 Labs - Once weekly:  CBC/D and BMP, Labs - Every other week:  ESR and CRP 03/29/19 04/27/19 Yes Nettey, Shayla D, MD  diltiazem (CARDIZEM CD) 120 MG 24 hr capsule Take 1 capsule (120 mg total) by mouth daily. 03/29/19  Yes Nettey, Shayla D, MD  ferrous sulfate 300 (60 Fe) MG/5ML syrup Take 5 mLs (300 mg total) by mouth 2 (two) times daily with a meal. 03/29/19  Yes Nettey, Shayla D, MD  gabapentin (NEURONTIN) 300 MG capsule Take 1 capsule (300 mg total) by mouth 3 (three) times daily. Need appointment before next refill 03/29/19  Yes Nettey, Shayla D, MD  lovastatin (MEVACOR) 20 MG tablet Take 1 tablet (20 mg total) by mouth at bedtime. Need appointment for further refills 03/29/19  Yes Nettey, Shayla D, MD  metoprolol tartrate (LOPRESSOR) 50 MG tablet Take 1 tablet (50 mg total) by mouth 2 (two) times daily. 03/29/19  Yes Nettey, Shayla D, MD  pantoprazole (PROTONIX) 40 MG tablet Take 1 tablet (40 mg total) by mouth daily. 03/30/19  Yes Nettey, Shayla D, MD  senna-docusate (SENOKOT-S) 8.6-50 MG tablet Take 2 tablets by mouth 2 (two) times daily. 03/29/19  Yes Nettey, Shayla D, MD  traMADol (ULTRAM) 50 MG tablet Take 1-2 tablets (50-100 mg total) by mouth every 6  (six) hours as needed for moderate pain. 03/29/19  Yes Nettey, Shayla D, MD  Vitamin D, Ergocalciferol, (DRISDOL) 1.25 MG (50000 UT) CAPS capsule Take 50,000 Units by mouth every 7 (seven) days.   Yes [provider]  warfarin (COUMADIN) 5 MG tablet HOLD AND DO NOT TAKE UNTIL WOUND EVALUATED BY DR Kateland Leisinger  TAKE 1 TABLET BY MOUTH DAILY EXCEPT 1 & 1/2 TABLETS ON WEDNESDAY OR AS DIRECTED BY ANTICOAGULATION CLINIC Patient not taking: Reported on 04/01/2019 03/29/19   Nettey, Shayla D, MD   No results found. -   pertinent xrays, CT, MRI studies were reviewed and independently interpreted  Positive ROS: All other systems have been reviewed and were otherwise negative with the exception of those mentioned in the HPI and as above.  Physical Exam: General: Alert, no acute distress Psychiatric: Patient is competent for consent with normal mood and affect Lymphatic: No axillary or cervical lymphadenopathy Cardiovascular: No pedal edema Respiratory: No cyanosis, no use of accessory musculature GI: No organomegaly, abdomen is soft and non-tender    Images:  @ENCIMAGES@  Labs:  Lab Results  Component Value Date   HGBA1C 5.8 (H) 02/03/2019   HGBA1C 6.3 (H) 04/13/2016   HGBA1C 5.9 10/22/2015   ESRSEDRATE 78 (H) 02/02/2019   ESRSEDRATE 39 (H) 10/22/2015   CRP 19.7 (H) 02/02/2019   REPTSTATUS 04/01/2019 FINAL 03/31/2019   GRAMSTAIN  02/22/2019    ABUNDANT WBC PRESENT, PREDOMINANTLY PMN FEW GRAM POSITIVE RODS RARE GRAM NEGATIVE RODS RARE GRAM VARIABLE ROD    CULT (A) 03/31/2019    <10,000 COLONIES/mL INSIGNIFICANT GROWTH Performed at [facility], [address] [place], [place] [address]    LABORGA ESCHERICHIA COLI 02/22/2019    Lab Results  Component Value Date   ALBUMIN 2.1 (L) 03/31/2019   ALBUMIN 1.9 (L) 03/25/2019   ALBUMIN 1.8 (L) 03/13/2019    Neurologic: Patient does not have protective sensation bilateral lower extremities.   MUSCULOSKELETAL:   Skin:  Examination patient has a large hematoma with complete dehiscence of the above-knee amputation on the right.  Patient has an albumin that is ranged from 1.8-2.1.  Hemoglobin A1c 5.8.  Patient currently has a urinary tract infection.  Assessment: Assessment: Dehiscence right above-the-knee amputation.  Plan: Plan: Discussed with the patient her best option is to proceed with a right hip disarticulation.  Risks and benefits were discussed including risk of the wound not healing.  Patient states she understands wishes to proceed at this time.  We will plan for surgery on Wednesday.  Thank you for the consult and the opportunity to see Ms. Fifield  Tayvin Preslar, MD [facility] [phone] 9:21 AM     

## 2019-04-04 NOTE — H&P (View-Only) (Signed)
ORTHOPAEDIC CONSULTATION  REQUESTING PHYSICIAN: Darliss Cheney, MD  Chief Complaint: Dehiscence right above-the-knee amputation.  HPI: Robin Snow is a 78 y.o. female who presents with dehiscence right above-the-knee amputation.  Has a history renal disease type 2 diabetes and severe protein caloric malnutrition as well as history of tobacco use.  Past Medical History:  Diagnosis Date  . AKI (acute kidney injury) ([place]) 04/2016  . Anemia   . Arthritis   . Chronic atrial fibrillation ([place])   . Chronic edema   . Chronic venous insufficiency   . CKD (chronic kidney disease), stage III   . Coagulopathy ([place])   . Diabetes mellitus    Type 2  . Diverticulosis   . Dyslipidemia   . GERD (gastroesophageal reflux disease)   . GI bleed 2015   a. lower GI from diverticular source 2015.  [name] Hx of transfusion of packed red blood cells   . Hypertension   . Morbid obesity ([place])   . Obstructive sleep apnea    on CPAP  . Pulmonary hypertension ([place])   . Pyelonephritis 04/2016  . Renal infarct ([place])    a. 04/2016- adm with flank pain, ? pyelo vs renal infarct in setting of recent subtherapeutic INR.  [name] Sinus bradycardia   . Stroke [facility]) 2015   Past Surgical History:  Procedure Laterality Date  . AMPUTATION Right 03/01/2019   Procedure: AMPUTATION ABOVE KNEE;  Surgeon: [name], MD;  Location: [place];  Service: Orthopedics;  Laterality: Right;  . CESAREAN SECTION    . COLONOSCOPY Left 01/24/2013   Procedure: COLONOSCOPY;  Surgeon: Arta Silence, MD;  Location: [facility] ENDOSCOPY;  Service: Endoscopy;  Laterality: Left;  . ESOPHAGOGASTRODUODENOSCOPY Left 01/23/2013   Procedure: ESOPHAGOGASTRODUODENOSCOPY (EGD);  Surgeon: Arta Silence, MD;  Location: [facility] ENDOSCOPY;  Service: Endoscopy;  Laterality: Left;  . EYE SURGERY Bilateral    cataract surgery  . JOINT REPLACEMENT    . KNEE CLOSED REDUCTION Right 02/22/2019   Procedure: CLOSED MANIPULATION KNEE;  Surgeon: Shona Needles,  MD;  Location: [place];  Service: Orthopedics;  Laterality: Right;  . PACEMAKER IMPLANT [name] 09/09/2016   Procedure: Pacemaker Implant;  Surgeon: Evans Lance, MD;  Location: [place] CV LAB;  Service: Cardiovascular;  Laterality: [name];  . REPLACEMENT TOTAL KNEE BILATERAL    . TUBAL LIGATION     Social History   Socioeconomic History  . Marital status: Widowed    Spouse name: Not on file  . Number of children: 4  . Years of education: 61  . Highest education level: Not on file  Occupational History  . Not on file  Tobacco Use  . Smoking status: Former Smoker    Packs/day: 0.12    Years: 34.00    Pack years: 4.08    Types: Cigarettes    Quit date: 07/07/1993    Years since quitting: 25.7  . Smokeless tobacco: Never Used  Substance and Sexual Activity  . Alcohol use: No    Comment: no longer drinks alcohol  . Drug use: No  . Sexual activity: Never  Other Topics Concern  . Not on file  Social History Narrative   Patient is widowed and has 4 living children, and 2 deceased children.   Patient is right handed.   Patient has hs education.   Patient drinks coffee and soda once a week.   Social Determinants of Health   Financial Resource Strain:   . Difficulty of Paying Living Expenses: Not on  file  Food Insecurity:   . Worried About Charity fundraiser in the Last Year: Not on file  . Ran Out of Food in the Last Year: Not on file  Transportation Needs:   . Lack of Transportation (Medical): Not on file  . Lack of Transportation (Non-Medical): Not on file  Physical Activity:   . Days of Exercise per Week: Not on file  . Minutes of Exercise per Session: Not on file  Stress:   . Feeling of Stress : Not on file  Social Connections:   . Frequency of Communication with Friends and Family: Not on file  . Frequency of Social Gatherings with Friends and Family: Not on file  . Attends Religious Services: Not on file  . Active Member of Clubs or Organizations: Not on file  .  Attends Archivist Meetings: Not on file  . Marital Status: Not on file   Family History  Problem Relation Age of Onset  . Cancer Father   . Stroke Maternal Aunt    - negative except otherwise stated in the family history section Allergies  Allergen Reactions  . Aspirin Hives and Swelling    Angioedema   . Rofecoxib Hives  . Hydrocodone Hives    Tolerates oxycodone and tramadol   Prior to Admission medications   Medication Sig Start Date End Date Taking? Authorizing Provider  amiodarone (PACERONE) 200 MG tablet Take 1 tablet (200 mg total) by mouth daily. 03/30/19  Yes Oretha Milch D, MD  cefTRIAXone (ROCEPHIN) IVPB Inject 2 g into the vein daily. Indication:  E Coli Endocarditis Last Day of Therapy:  04/12/2019 Labs - Once weekly:  CBC/D and BMP, Labs - Every other week:  ESR and CRP 03/29/19 04/27/19 Yes Oretha Milch D, MD  diltiazem (CARDIZEM CD) 120 MG 24 hr capsule Take 1 capsule (120 mg total) by mouth daily. 03/29/19  Yes Oretha Milch D, MD  ferrous sulfate 300 (60 Fe) MG/5ML syrup Take 5 mLs (300 mg total) by mouth 2 (two) times daily with a meal. 03/29/19  Yes Oretha Milch D, MD  gabapentin (NEURONTIN) 300 MG capsule Take 1 capsule (300 mg total) by mouth 3 (three) times daily. Need appointment before next refill 03/29/19  Yes Oretha Milch D, MD  lovastatin (MEVACOR) 20 MG tablet Take 1 tablet (20 mg total) by mouth at bedtime. Need appointment for further refills 03/29/19  Yes Oretha Milch D, MD  metoprolol tartrate (LOPRESSOR) 50 MG tablet Take 1 tablet (50 mg total) by mouth 2 (two) times daily. 03/29/19  Yes Oretha Milch D, MD  pantoprazole (PROTONIX) 40 MG tablet Take 1 tablet (40 mg total) by mouth daily. 03/30/19  Yes Oretha Milch D, MD  senna-docusate (SENOKOT-S) 8.6-50 MG tablet Take 2 tablets by mouth 2 (two) times daily. 03/29/19  Yes Oretha Milch D, MD  traMADol (ULTRAM) 50 MG tablet Take 1-2 tablets (50-100 mg total) by mouth every 6  (six) hours as needed for moderate pain. 03/29/19  Yes Oretha Milch D, MD  Vitamin D, Ergocalciferol, (DRISDOL) 1.25 MG (50000 UT) CAPS capsule Take 50,000 Units by mouth every 7 (seven) days.   Yes [provider]  warfarin (COUMADIN) 5 MG tablet HOLD AND DO NOT TAKE UNTIL WOUND EVALUATED BY DR Jacquelyn Antony  TAKE 1 TABLET BY MOUTH DAILY EXCEPT 1 & 1/2 TABLETS ON WEDNESDAY OR AS DIRECTED BY ANTICOAGULATION CLINIC Patient not taking: Reported on 04/01/2019 03/29/19   Desiree Hane, MD   No results found. -  pertinent xrays, CT, MRI studies were reviewed and independently interpreted  Positive ROS: All other systems have been reviewed and were otherwise negative with the exception of those mentioned in the HPI and as above.  Physical Exam: General: Alert, no acute distress Psychiatric: Patient is competent for consent with normal mood and affect Lymphatic: No axillary or cervical lymphadenopathy Cardiovascular: No pedal edema Respiratory: No cyanosis, no use of accessory musculature GI: No organomegaly, abdomen is soft and non-tender    Images:  _0 @  Labs:  Lab Results  Component Value Date   HGBA1C 5.8 (H) 02/03/2019   HGBA1C 6.3 (H) 04/13/2016   HGBA1C 5.9 10/22/2015   ESRSEDRATE 78 (H) 02/02/2019   ESRSEDRATE 39 (H) 10/22/2015   CRP 19.7 (H) 02/02/2019   REPTSTATUS 04/01/2019 FINAL 03/31/2019   GRAMSTAIN  02/22/2019    ABUNDANT WBC PRESENT, PREDOMINANTLY PMN FEW GRAM POSITIVE RODS RARE GRAM NEGATIVE RODS RARE GRAM VARIABLE ROD    CULT (A) 03/31/2019    <10,000 COLONIES/mL INSIGNIFICANT GROWTH Performed at [facility], [place] [address], [place], [place] [address]    LABORGA ESCHERICHIA COLI 02/22/2019    Lab Results  Component Value Date   ALBUMIN 2.1 (L) 03/31/2019   ALBUMIN 1.9 (L) 03/25/2019   ALBUMIN 1.8 (L) 03/13/2019    Neurologic: Patient does not have protective sensation bilateral lower extremities.   MUSCULOSKELETAL:   Skin:  Examination patient has a large hematoma with complete dehiscence of the above-knee amputation on the right.  Patient has an albumin that is ranged from 1.8-2.1.  Hemoglobin A1c 5.8.  Patient currently has a urinary tract infection.  Assessment: Assessment: Dehiscence right above-the-knee amputation.  Plan: Plan: Discussed with the patient her best option is to proceed with a right hip disarticulation.  Risks and benefits were discussed including risk of the wound not healing.  Patient states she understands wishes to proceed at this time.  We will plan for surgery on Wednesday.  Thank you for the consult and the opportunity to see Ms. Lady Deutscher, MD [facility] [phone] 9:21 AM

## 2019-04-05 ENCOUNTER — Inpatient Hospital Stay (HOSPITAL_COMMUNITY): Payer: Medicare HMO | Admitting: Anesthesiology

## 2019-04-05 ENCOUNTER — Encounter (HOSPITAL_COMMUNITY): Payer: Self-pay | Admitting: Family Medicine

## 2019-04-05 ENCOUNTER — Encounter (HOSPITAL_COMMUNITY)
Admission: EM | Disposition: A | Discharge status: Skilled Nursing Facility | Payer: Self-pay | Source: Skilled Nursing Facility | Attending: Internal Medicine

## 2019-04-05 DIAGNOSIS — T8781 Dehiscence of amputation stump: Principal | ICD-10-CM

## 2019-04-05 DIAGNOSIS — Z89621 Acquired absence of right hip joint: Secondary | ICD-10-CM

## 2019-04-05 DIAGNOSIS — Z89611 Acquired absence of right leg above knee: Secondary | ICD-10-CM

## 2019-04-05 DIAGNOSIS — T8130XA Disruption of wound, unspecified, initial encounter: Secondary | ICD-10-CM

## 2019-04-05 HISTORY — PX: HIP DISARTICULATION: SHX5851

## 2019-04-05 LAB — CBC WITH DIFFERENTIAL/PLATELET
Abs Immature Granulocytes: 0.14 10*3/uL — ABNORMAL HIGH (ref 0.00–0.07)
Basophils Absolute: 0.1 10*3/uL (ref 0.0–0.1)
Basophils Relative: 1 %
Eosinophils Absolute: 0.1 10*3/uL (ref 0.0–0.5)
Eosinophils Relative: 1 %
HCT: 30.6 % — ABNORMAL LOW (ref 36.0–46.0)
Hemoglobin: 9.5 g/dL — ABNORMAL LOW (ref 12.0–15.0)
Immature Granulocytes: 1 %
Lymphocytes Relative: 27 %
Lymphs Abs: 2.6 10*3/uL (ref 0.7–4.0)
MCH: 29.6 pg (ref 26.0–34.0)
MCHC: 31 g/dL (ref 30.0–36.0)
MCV: 95.3 fL (ref 80.0–100.0)
Monocytes Absolute: 1.2 10*3/uL — ABNORMAL HIGH (ref 0.1–1.0)
Monocytes Relative: 12 %
Neutro Abs: 5.7 10*3/uL (ref 1.7–7.7)
Neutrophils Relative %: 58 %
Platelets: 221 10*3/uL (ref 150–400)
RBC: 3.21 MIL/uL — ABNORMAL LOW (ref 3.87–5.11)
RDW: 15.5 % (ref 11.5–15.5)
WBC: 9.7 10*3/uL (ref 4.0–10.5)
nRBC: 0 % (ref 0.0–0.2)

## 2019-04-05 LAB — POCT I-STAT EG7
Acid-Base Excess: 2 mmol/L (ref 0.0–2.0)
Acid-Base Excess: 4 mmol/L — ABNORMAL HIGH (ref 0.0–2.0)
Bicarbonate: 27 mmol/L (ref 20.0–28.0)
Bicarbonate: 29.2 mmol/L — ABNORMAL HIGH (ref 20.0–28.0)
Calcium, Ion: 1.16 mmol/L (ref 1.15–1.40)
Calcium, Ion: 1.14 mmol/L — ABNORMAL LOW (ref 1.15–1.40)
HCT: 21 % — ABNORMAL LOW (ref 36.0–46.0)
HCT: 22 % — ABNORMAL LOW (ref 36.0–46.0)
Hemoglobin: 7.1 g/dL — ABNORMAL LOW (ref 12.0–15.0)
Hemoglobin: 7.5 g/dL — ABNORMAL LOW (ref 12.0–15.0)
O2 Saturation: 68 %
O2 Saturation: 73 %
Patient temperature: 37
Potassium: 4.2 mmol/L (ref 3.5–5.1)
Potassium: 4.1 mmol/L (ref 3.5–5.1)
Sodium: 139 mmol/L (ref 135–145)
Sodium: 141 mmol/L (ref 135–145)
TCO2: 28 mmol/L (ref 22–32)
TCO2: 31 mmol/L (ref 22–32)
pCO2, Ven: 41.6 mmHg — ABNORMAL LOW (ref 44.0–60.0)
pCO2, Ven: 46.5 mmHg (ref 44.0–60.0)
pH, Ven: 7.42 (ref 7.250–7.430)
pH, Ven: 7.406 (ref 7.250–7.430)
pO2, Ven: 35 mmHg (ref 32.0–45.0)
pO2, Ven: 39 mmHg (ref 32.0–45.0)

## 2019-04-05 LAB — CULTURE, BLOOD (ROUTINE X 2)
Culture: NO GROWTH
Culture: NO GROWTH
Special Requests: ADEQUATE

## 2019-04-05 LAB — GLUCOSE, CAPILLARY
Glucose-Capillary: 104 mg/dL — ABNORMAL HIGH (ref 70–99)
Glucose-Capillary: 114 mg/dL — ABNORMAL HIGH (ref 70–99)
Glucose-Capillary: 138 mg/dL — ABNORMAL HIGH (ref 70–99)
Glucose-Capillary: 146 mg/dL — ABNORMAL HIGH (ref 70–99)
Glucose-Capillary: 141 mg/dL — ABNORMAL HIGH (ref 70–99)

## 2019-04-05 LAB — PREPARE RBC (CROSSMATCH)

## 2019-04-05 LAB — BASIC METABOLIC PANEL
Anion gap: 10 (ref 5–15)
BUN: 13 mg/dL (ref 8–23)
CO2: 26 mmol/L (ref 22–32)
Calcium: 8.4 mg/dL — ABNORMAL LOW (ref 8.9–10.3)
Chloride: 103 mmol/L (ref 98–111)
Creatinine, Ser: 0.9 mg/dL (ref 0.44–1.00)
GFR calc Af Amer: >60 mL/min (ref >60)
GFR calc non Af Amer: >60 mL/min (ref >60)
Glucose, Bld: 108 mg/dL — ABNORMAL HIGH (ref 70–99)
Potassium: 3.7 mmol/L (ref 3.5–5.1)
Sodium: 139 mmol/L (ref 135–145)

## 2019-04-05 LAB — SURGICAL PCR SCREEN
MRSA, PCR: NEGATIVE
Staphylococcus aureus: NEGATIVE

## 2019-04-05 SURGERY — DISARTICULATION, HIP
Anesthesia: General | Site: Hip | Laterality: Right

## 2019-04-05 MED ORDER — ROCURONIUM BROMIDE 10 MG/ML (PF) SYRINGE
PREFILLED_SYRINGE | INTRAVENOUS | Status: AC
  Filled 2019-04-05: qty 10

## 2019-04-05 MED ORDER — ALBUMIN HUMAN 5 % IV SOLN: INTRAVENOUS | Status: DC | PRN

## 2019-04-05 MED ORDER — PROMETHAZINE HCL 25 MG/ML IJ SOLN: 6.2500 mg | INTRAMUSCULAR | Status: DC | PRN

## 2019-04-05 MED ORDER — SODIUM CHLORIDE 0.9% IV SOLUTION: Freq: Once | INTRAVENOUS | Status: DC

## 2019-04-05 MED ORDER — HYDROMORPHONE HCL 1 MG/ML IJ SOLN
0.2500 mg | INTRAMUSCULAR | Status: DC | PRN
  Administered 2019-04-05 (×2): 0.25 mg via INTRAVENOUS

## 2019-04-05 MED ORDER — OXYCODONE HCL 5 MG PO TABS
5.0000 mg | ORAL_TABLET | ORAL | Status: DC | PRN
  Administered 2019-04-06: 10 mg via ORAL
  Filled 2019-04-05: qty 2

## 2019-04-05 MED ORDER — ONDANSETRON HCL 4 MG/2ML IJ SOLN
INTRAMUSCULAR | Status: AC
  Filled 2019-04-05: qty 2

## 2019-04-05 MED ORDER — FENTANYL CITRATE (PF) 100 MCG/2ML IJ SOLN: 25.0000 ug | INTRAMUSCULAR | Status: DC | PRN

## 2019-04-05 MED ORDER — DEXAMETHASONE SODIUM PHOSPHATE 10 MG/ML IJ SOLN
INTRAMUSCULAR | Status: DC | PRN
  Administered 2019-04-05: 4 mg via INTRAVENOUS

## 2019-04-05 MED ORDER — FENTANYL CITRATE (PF) 100 MCG/2ML IJ SOLN
INTRAMUSCULAR | Status: DC | PRN
  Administered 2019-04-05: 25 ug via INTRAVENOUS
  Administered 2019-04-05: 100 ug via INTRAVENOUS
  Administered 2019-04-05: 25 ug via INTRAVENOUS

## 2019-04-05 MED ORDER — PROPOFOL 10 MG/ML IV BOLUS
INTRAVENOUS | Status: AC
  Filled 2019-04-05: qty 20

## 2019-04-05 MED ORDER — LACTATED RINGERS IV SOLN: INTRAVENOUS | Status: DC | PRN

## 2019-04-05 MED ORDER — HYDROMORPHONE HCL 1 MG/ML IJ SOLN
INTRAMUSCULAR | Status: AC
  Filled 2019-04-05: qty 1

## 2019-04-05 MED ORDER — DOCUSATE SODIUM 100 MG PO CAPS
100.0000 mg | ORAL_CAPSULE | Freq: Two times a day (BID) | ORAL | Status: DC
  Administered 2019-04-06: 100 mg via ORAL
  Filled 2019-04-05: qty 1

## 2019-04-05 MED ORDER — ROCURONIUM BROMIDE 10 MG/ML (PF) SYRINGE
PREFILLED_SYRINGE | INTRAVENOUS | Status: DC | PRN
  Administered 2019-04-05: 45 mg via INTRAVENOUS

## 2019-04-05 MED ORDER — SUGAMMADEX SODIUM 200 MG/2ML IV SOLN
INTRAVENOUS | Status: DC | PRN
  Administered 2019-04-05: 200 mg via INTRAVENOUS

## 2019-04-05 MED ORDER — FENTANYL CITRATE (PF) 250 MCG/5ML IJ SOLN
INTRAMUSCULAR | Status: AC
  Filled 2019-04-05: qty 5

## 2019-04-05 MED ORDER — 0.9 % SODIUM CHLORIDE (POUR BTL) OPTIME
TOPICAL | Status: DC | PRN
  Administered 2019-04-05 (×2): 1000 mL

## 2019-04-05 MED ORDER — SODIUM CHLORIDE 0.9% IV SOLUTION: Freq: Once | INTRAVENOUS | Status: AC

## 2019-04-05 MED ORDER — MEPERIDINE HCL 25 MG/ML IJ SOLN: 6.2500 mg | INTRAMUSCULAR | Status: DC | PRN

## 2019-04-05 MED ORDER — PHENYLEPHRINE 40 MCG/ML (10ML) SYRINGE FOR IV PUSH (FOR BLOOD PRESSURE SUPPORT)
PREFILLED_SYRINGE | INTRAVENOUS | Status: DC | PRN
  Administered 2019-04-05: 120 ug via INTRAVENOUS
  Administered 2019-04-05: 80 ug via INTRAVENOUS

## 2019-04-05 MED ORDER — PROPOFOL 10 MG/ML IV BOLUS
INTRAVENOUS | Status: DC | PRN
  Administered 2019-04-05: 70 mg via INTRAVENOUS

## 2019-04-05 MED ORDER — MIDAZOLAM HCL 2 MG/2ML IJ SOLN: 0.5000 mg | Freq: Once | INTRAMUSCULAR | Status: DC | PRN

## 2019-04-05 MED ORDER — CEFAZOLIN SODIUM-DEXTROSE 2-4 GM/100ML-% IV SOLN
2.0000 g | INTRAVENOUS | Status: AC
  Administered 2019-04-05: 11:00:00 2 g via INTRAVENOUS
  Filled 2019-04-05: qty 100

## 2019-04-05 MED ORDER — SODIUM CHLORIDE 0.9 % IV SOLN: INTRAVENOUS | Status: DC | PRN

## 2019-04-05 MED ORDER — SODIUM CHLORIDE 0.9 % IV SOLN
1.0000 g | Freq: Once | INTRAVENOUS | Status: AC
  Administered 2019-04-05: 1 g via INTRAVENOUS
  Filled 2019-04-05: qty 10

## 2019-04-05 MED ORDER — ONDANSETRON HCL 4 MG/2ML IJ SOLN
INTRAMUSCULAR | Status: DC | PRN
  Administered 2019-04-05: 4 mg via INTRAVENOUS

## 2019-04-05 MED ORDER — PHENYLEPHRINE HCL-NACL 10-0.9 MG/250ML-% IV SOLN
INTRAVENOUS | Status: DC | PRN
  Administered 2019-04-05: 25 ug/min via INTRAVENOUS

## 2019-04-05 MED ORDER — DEXAMETHASONE SODIUM PHOSPHATE 10 MG/ML IJ SOLN
INTRAMUSCULAR | Status: AC
  Filled 2019-04-05: qty 1

## 2019-04-05 MED ORDER — PHENYLEPHRINE 40 MCG/ML (10ML) SYRINGE FOR IV PUSH (FOR BLOOD PRESSURE SUPPORT)
PREFILLED_SYRINGE | INTRAVENOUS | Status: AC
  Filled 2019-04-05: qty 10

## 2019-04-05 MED ORDER — HYDROMORPHONE HCL 1 MG/ML IJ SOLN
0.5000 mg | INTRAMUSCULAR | Status: DC | PRN
  Administered 2019-04-05 – 2019-04-07 (×2): 0.5 mg via INTRAVENOUS
  Filled 2019-04-05: qty 0.5
  Filled 2019-04-05: qty 1

## 2019-04-05 MED ORDER — LIDOCAINE 2% (20 MG/ML) 5 ML SYRINGE
INTRAMUSCULAR | Status: AC
  Filled 2019-04-05: qty 5

## 2019-04-05 SURGICAL SUPPLY — 40 items
BLADE SAW SAG 73X25 THK (BLADE) ×1
BLADE SAW SGTL 73X25 THK (BLADE) ×2 IMPLANT
COVER SURGICAL LIGHT HANDLE (MISCELLANEOUS) ×3 IMPLANT
COVER WAND RF STERILE (DRAPES) ×3 IMPLANT
DRAPE IMP U-DRAPE 54X76 (DRAPES) ×3 IMPLANT
DRAPE INCISE IOBAN 85X60 (DRAPES) ×6 IMPLANT
DRAPE ORTHO SPLIT 77X108 STRL (DRAPES) ×6
DRAPE SURG ORHT 6 SPLT 77X108 (DRAPES) ×2 IMPLANT
DRAPE U-SHAPE 47X51 STRL (DRAPES) ×3 IMPLANT
DRSG PAD ABDOMINAL 8X10 ST (GAUZE/BANDAGES/DRESSINGS) ×6 IMPLANT
DURAPREP 26ML APPLICATOR (WOUND CARE) ×3 IMPLANT
ELECT BLADE 6.5 EXT (BLADE) IMPLANT
ELECT CAUTERY BLADE 6.4 (BLADE) IMPLANT
ELECT REM PT RETURN 9FT ADLT (ELECTROSURGICAL) ×3
ELECTRODE REM PT RTRN 9FT ADLT (ELECTROSURGICAL) ×1 IMPLANT
EVACUATOR 1/8 PVC DRAIN (DRAIN) IMPLANT
GAUZE SPONGE 4X4 12PLY STRL (GAUZE/BANDAGES/DRESSINGS) ×3 IMPLANT
GLOVE BIOGEL PI IND STRL 9 (GLOVE) ×1 IMPLANT
GLOVE BIOGEL PI INDICATOR 9 (GLOVE) ×2
GLOVE SURG ORTHO 9.0 STRL STRW (GLOVE) ×3 IMPLANT
GOWN STRL REUS W/ TWL XL LVL3 (GOWN DISPOSABLE) ×3 IMPLANT
GOWN STRL REUS W/TWL XL LVL3 (GOWN DISPOSABLE) ×9
HANDPIECE INTERPULSE COAX TIP (DISPOSABLE)
KIT BASIN OR (CUSTOM PROCEDURE TRAY) ×3 IMPLANT
KIT TURNOVER KIT B (KITS) ×3 IMPLANT
MANIFOLD NEPTUNE II (INSTRUMENTS) ×3 IMPLANT
NS IRRIG 1000ML POUR BTL (IV SOLUTION) ×3 IMPLANT
PACK TOTAL JOINT (CUSTOM PROCEDURE TRAY) ×3 IMPLANT
PACK UNIVERSAL I (CUSTOM PROCEDURE TRAY) ×3 IMPLANT
PAD ARMBOARD 7.5X6 YLW CONV (MISCELLANEOUS) ×6 IMPLANT
SET HNDPC FAN SPRY TIP SCT (DISPOSABLE) IMPLANT
STAPLER VISISTAT 35W (STAPLE) ×3 IMPLANT
SUT ETHIBOND NAB CT1 #1 30IN (SUTURE) ×3 IMPLANT
SUT VIC AB 1 CT1 27 (SUTURE) ×3
SUT VIC AB 1 CT1 27XBRD ANBCTR (SUTURE) ×1 IMPLANT
SUT VIC AB 2-0 CTB1 (SUTURE) ×3 IMPLANT
TOWEL GREEN STERILE (TOWEL DISPOSABLE) ×3 IMPLANT
TOWEL GREEN STERILE FF (TOWEL DISPOSABLE) ×3 IMPLANT
TRAY FOLEY MTR SLVR 16FR STAT (SET/KITS/TRAYS/PACK) IMPLANT
WATER STERILE IRR 1000ML POUR (IV SOLUTION) ×9 IMPLANT

## 2019-04-05 NOTE — Consult Note (Signed)
NAME:  Robin Snow, MRN:  [id number], DOB:  13-Mar-1941, LOS: 5 ADMISSION DATE:  03/31/2019, CONSULTATION DATE:  04/05/19 REFERRING MD:  Sharol Given, CHIEF COMPLAINT:  Leg pain   Brief History   78 year old woman with hx of Afib, HTN, CKD, stroke p/w R AKA dehiscence brought to ICU for close monitoring after ABLA post procedure.  History of present illness   78 year old woman with hx of Afib, HTN, CKD, stroke p/w R AKA dehiscence brought to ICU for close monitoring after ABLA post procedure.  Complicated PMH.  History as below:  "Patient had R TKA in [place] in 2019.  Patient recently admitted from 10/30-12/23 for dislocation of R knee prosthesis with septic joint.  Closed reduction by Dr. Doreatha Martin 11/18. 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 Ultimately underwent R AKA by [name].  This complicated by sepsis from E.Coli bacteremia, spent several days in ICU on vent.  Also complicated by AKA wound dehissance. Patient finally discharged 12/23 to [place] care on rocephin to complete 6 weeks of therapy with end date of 1/6. For a full recounting of the prolonged, nearly 2 month hospital stay, please see Dr. Lisbeth Ply discharge summary on 12/23."  Patient still groggy from surgery, c/o right leg pain.  Past Medical History  CKD Anemia DM GIB HTN OSA on CPAP Pulmonary HTN Stroke  Past Surgical History:  Procedure Laterality Date  . AMPUTATION Right 03/01/2019   Procedure: AMPUTATION ABOVE KNEE;  Surgeon: [name], MD;  Location: [place];  Service: Orthopedics;  Laterality: Right;  . CESAREAN SECTION    . COLONOSCOPY Left 01/24/2013   Procedure: COLONOSCOPY;  Surgeon: Arta Silence, MD;  Location: [facility] ENDOSCOPY;  Service: Endoscopy;  Laterality: Left;  . ESOPHAGOGASTRODUODENOSCOPY Left 01/23/2013   Procedure: ESOPHAGOGASTRODUODENOSCOPY (EGD);  Surgeon: Arta Silence, MD;  Location: [facility] ENDOSCOPY;  Service: Endoscopy;  Laterality:  Left;  . EYE SURGERY Bilateral    cataract surgery  . JOINT REPLACEMENT    . KNEE CLOSED REDUCTION Right 02/22/2019   Procedure: CLOSED MANIPULATION KNEE;  Surgeon: Shona Needles, MD;  Location: [place];  Service: Orthopedics;  Laterality: Right;  . PACEMAKER IMPLANT [name] 09/09/2016   Procedure: Pacemaker Implant;  Surgeon: Evans Lance, MD;  Location: [place] CV LAB;  Service: Cardiovascular;  Laterality: [name];  . REPLACEMENT TOTAL KNEE BILATERAL    . [place] [facility] Events   03/31/19 admitted 04/05/19 hip disarticulation and revision  Consults:  Ortho Hospitalist  Procedures:  As above   Significant Diagnostic Tests:  H/H post op 7.5 from 9.5 pre-op  Micro Data:  Urine NG  Antimicrobials:  Ceftriaxone 12/24 >>   Interim history/subjective:  Consulted.  Objective   Blood pressure (!) 146/77, pulse (!) 59, temperature 98.5 F (36.9 C), temperature source Axillary, resp. rate 13, height '5\' 7"'$  (1.702 m), weight (P) 87.5 kg, SpO2 100 %.        Intake/Output Summary (Last 24 hours) at 04/05/2019 1736 Last data filed at 04/05/2019 1503 Gross per 24 hour  Intake 2320 ml  Output 2250 ml  Net 70 ml   Filed Weights   04/01/19 1023 04/05/19 1705  Weight: 102 kg (P) 87.5 kg    Examination: General: frail elderly woman lying in bed HENT: MMM, trachea midline Lungs: Clear Cardiovascular: sound regular to me, +SEM, upper ext warm Abdomen: soft, hypoactive BS Extremities: R groin wound vac  in place Neuro: moves 3 ext to command Skin: no rashes  Resolved Hospital Problem list   [name]  Assessment & Plan:  # ABLA- being transfused 3 units pRBC # Afib- looks to be in sinus right now # E coli bacteremia/ endocarditis- ceftriaxone through 04/11/18  - VAC and post op care per ortho - Add 1g calcium chloride - 3rd unit pRBC being ordered - Seems to be out of shock now - Fine to resume home BB/CCB/Amio - AC when cleared by ortho -  Discussed with primary, PCCM will remain available as consultants, if patient HD stable in AM we will sign off    Labs   CBC: Recent Labs  Lab 04/01/19 1517 04/02/19 0850 04/03/19 0312 04/04/19 0422 04/05/19 0448 04/05/19 1211 04/05/19 1504  WBC 6.5 6.8 6.6 6.3 9.7  --   --   NEUTROABS 3.2 3.1 3.5 3.0 5.7  --   --   HGB 8.7* 8.7* 8.8* 9.2* 9.5* 7.1* 7.5*  HCT 27.8* 28.1* 28.0* 29.0* 30.6* 21.0* 22.0*  MCV 94.9 96.6 95.6 95.1 95.3  --   --   PLT 238 258 248 251 221  --   --     Basic Metabolic Panel: Recent Labs  Lab 04/01/19 1517 04/02/19 0850 04/03/19 0312 04/03/19 0922 04/04/19 0422 04/05/19 0448 04/05/19 1211 04/05/19 1504  NA 138 140 138  --  139 139 139 141  K 3.3* 3.4* 3.4*  --  3.9 3.7 4.2 4.1  CL 102 103 103  --  103 103  --   --   CO2 '28 30 27  '$ --  27 26  --   --   GLUCOSE 103* 84 91  --  92 108*  --   --   BUN '13 12 12  '$ --  12 13  --   --   CREATININE 0.89 0.96 0.93  --  0.86 0.90  --   --   CALCIUM 8.0* 8.2* 8.0*  --  8.3* 8.4*  --   --   MG  --   --   --  1.7  --   --   --   --    GFR: Estimated Creatinine Clearance: 62.3 mL/min (by C-G formula based on SCr of 0.9 mg/dL). Recent Labs  Lab 03/31/19 0044 04/02/19 0850 04/03/19 0312 04/04/19 0422 04/05/19 0448  WBC 8.6 6.8 6.6 6.3 9.7  LATICACIDVEN 1.3  --   --   --   --     Liver Function Tests: Recent Labs  Lab 03/31/19 0044  AST 27  ALT 22  ALKPHOS 118  BILITOT 1.3*  PROT 5.9*  ALBUMIN 2.1*   No results for input(s): LIPASE, AMYLASE in the last 168 hours. No results for input(s): AMMONIA in the last 168 hours.  ABG    Component Value Date/Time   PHART 7.370 03/02/2019 1202   PCO2ART 37.2 03/02/2019 1202   PO2ART 149.0 (H) 03/02/2019 1202   HCO3 29.2 (H) 04/05/2019 1504   TCO2 31 04/05/2019 1504   ACIDBASEDEF 3.0 (H) 03/02/2019 1202   O2SAT 73.0 04/05/2019 1504     Coagulation Profile: Recent Labs  Lab 03/31/19 0044  INR 1.2    Cardiac Enzymes: No results for  input(s): CKTOTAL, CKMB, CKMBINDEX, TROPONINI in the last 168 hours.  HbA1C: Hgb A1c MFr Bld  Date/Time Value Ref Range Status  02/03/2019 02:09 AM 5.8 (H) 4.8 - 5.6 % Final    Comment:    REPEATED TO VERIFY (  NOTE) Pre diabetes:          5.7%-6.4% Diabetes:              >6.4% Glycemic control for   <7.0% adults with diabetes   04/13/2016 10:18 AM 6.3 (H) 4.8 - 5.6 % Final    Comment:    (NOTE)         Pre-diabetes: 5.7 - 6.4         Diabetes: >6.4         Glycemic control for adults with diabetes: <7.0     CBG: Recent Labs  Lab 04/05/19 1037 04/05/19 1337 04/05/19 1700  GLUCAP 104* 114* 138*    Review of Systems:   Too groggy to answer  Past Medical History  She,  has a past medical history of AKI (acute kidney injury) ([place]) (04/2016), Anemia, Arthritis, Chronic atrial fibrillation ([place]), Chronic edema, Chronic venous insufficiency, CKD (chronic kidney disease), stage III, Coagulopathy ([place]), Diabetes mellitus, Diverticulosis, Dyslipidemia, GERD (gastroesophageal reflux disease), GI bleed (2015), transfusion of packed red blood cells, Hypertension, Morbid obesity ([place]), Obstructive sleep apnea, Pulmonary hypertension ([place]), Pyelonephritis (04/2016), Renal infarct [facility]), Sinus bradycardia, and Stroke ([place]) (2015).   Surgical History    Past Surgical History:  Procedure Laterality Date  . AMPUTATION Right 03/01/2019   Procedure: AMPUTATION ABOVE KNEE;  Surgeon: [name], MD;  Location: [place];  Service: Orthopedics;  Laterality: Right;  . CESAREAN SECTION    . COLONOSCOPY Left 01/24/2013   Procedure: COLONOSCOPY;  Surgeon: Arta Silence, MD;  Location: [facility] ENDOSCOPY;  Service: Endoscopy;  Laterality: Left;  . ESOPHAGOGASTRODUODENOSCOPY Left 01/23/2013   Procedure: ESOPHAGOGASTRODUODENOSCOPY (EGD);  Surgeon: Arta Silence, MD;  Location: [facility] ENDOSCOPY;  Service: Endoscopy;  Laterality: Left;  . EYE SURGERY Bilateral    cataract surgery  . JOINT REPLACEMENT    .  KNEE CLOSED REDUCTION Right 02/22/2019   Procedure: CLOSED MANIPULATION KNEE;  Surgeon: Shona Needles, MD;  Location: [place];  Service: Orthopedics;  Laterality: Right;  . PACEMAKER IMPLANT [name] 09/09/2016   Procedure: Pacemaker Implant;  Surgeon: Evans Lance, MD;  Location: [place] CV LAB;  Service: Cardiovascular;  Laterality: [name];  . REPLACEMENT TOTAL KNEE BILATERAL    . TUBAL LIGATION       Social History   reports that she quit smoking about 25 years ago. Her smoking use included cigarettes. She has a 4.08 pack-year smoking history. She has never used smokeless tobacco. She reports that she does not drink alcohol or use drugs.   Family History   Her family history includes Cancer in her father; Stroke in her maternal aunt.   Allergies Allergies  Allergen Reactions  . Aspirin Hives and Swelling    Angioedema   . Rofecoxib Hives  . Hydrocodone Hives    Tolerates oxycodone and tramadol     Home Medications  Prior to Admission medications   Medication Sig Start Date End Date Taking? Authorizing Provider  amiodarone (PACERONE) 200 MG tablet Take 1 tablet (200 mg total) by mouth daily. 03/30/19  Yes Oretha Milch D, MD  cefTRIAXone (ROCEPHIN) IVPB Inject 2 g into the vein daily. Indication:  E Coli Endocarditis Last Day of Therapy:  04/12/2019 Labs - Once weekly:  CBC/D and BMP, Labs - Every other week:  ESR and CRP 03/29/19 04/27/19 Yes Oretha Milch D, MD  diltiazem (CARDIZEM CD) 120 MG 24 hr capsule Take 1 capsule (120 mg total) by mouth daily. 03/29/19  Yes Desiree Hane, MD  ferrous sulfate 300 (60 Fe) MG/5ML syrup Take 5 mLs (300 mg total) by mouth 2 (two) times daily with a meal. 03/29/19  Yes Oretha Milch D, MD  gabapentin (NEURONTIN) 300 MG capsule Take 1 capsule (300 mg total) by mouth 3 (three) times daily. Need appointment before next refill 03/29/19  Yes Oretha Milch D, MD  lovastatin (MEVACOR) 20 MG tablet Take 1 tablet (20 mg total) by mouth at bedtime.  Need appointment for further refills 03/29/19  Yes Oretha Milch D, MD  metoprolol tartrate (LOPRESSOR) 50 MG tablet Take 1 tablet (50 mg total) by mouth 2 (two) times daily. 03/29/19  Yes Oretha Milch D, MD  pantoprazole (PROTONIX) 40 MG tablet Take 1 tablet (40 mg total) by mouth daily. 03/30/19  Yes Oretha Milch D, MD  senna-docusate (SENOKOT-S) 8.6-50 MG tablet Take 2 tablets by mouth 2 (two) times daily. 03/29/19  Yes Oretha Milch D, MD  traMADol (ULTRAM) 50 MG tablet Take 1-2 tablets (50-100 mg total) by mouth every 6 (six) hours as needed for moderate pain. 03/29/19  Yes Oretha Milch D, MD  Vitamin D, Ergocalciferol, (DRISDOL) 1.25 MG (50000 UT) CAPS capsule Take 50,000 Units by mouth every 7 (seven) days.   Yes [provider]  warfarin (COUMADIN) 5 MG tablet HOLD AND DO NOT TAKE UNTIL WOUND EVALUATED BY DR DUDA  TAKE 1 TABLET BY MOUTH DAILY EXCEPT 1 & 1/2 TABLETS ON WEDNESDAY OR AS DIRECTED BY ANTICOAGULATION CLINIC Patient not taking: Reported on 04/01/2019 03/29/19   Desiree Hane, MD

## 2019-04-05 NOTE — Anesthesia Procedure Notes (Signed)

## 2019-04-05 NOTE — Anesthesia Postprocedure Evaluation (Signed)
Anesthesia Post Note  Patient: Robin Snow  Procedure(s) Performed: HIP DISARTICULATION (Right Hip)     Patient location during evaluation: PACU Anesthesia Type: General Level of consciousness: sedated Pain management: pain level controlled Vital Signs Assessment: post-procedure vital signs reviewed and stable Respiratory status: spontaneous breathing, nonlabored ventilation, respiratory function stable and patient connected to nasal cannula oxygen Cardiovascular status: blood pressure returned to baseline and stable Postop Assessment: no apparent nausea or vomiting Anesthetic complications: no Comments: Discussed pt's intra-op blood loss, infected/necrotic pelvis s/p hip disarticulation with Dr Sharol Given and ICU MD. ICU MD agrees to monitoring patient overnight in ICU.    Last Vitals:  Vitals:   04/05/19 1515 04/05/19 1530  BP: 125/65 130/71  Pulse: 60 (!) 59  Resp: 14 15  Temp: 36.6 C   SpO2: 100% 100%    Last Pain:  Vitals:   04/05/19 1515  TempSrc: Oral  PainSc:                  Marlies Ligman,E. Chiffon Kittleson

## 2019-04-05 NOTE — Interval H&P Note (Signed)
History and Physical Interval Note:  04/05/2019 6:46 AM  Robin Snow  has presented today for surgery, with the diagnosis of Wound Dehiscence Right Above Knee Amputation.  The various methods of treatment have been discussed with the patient and family. After consideration of risks, benefits and other options for treatment, the patient has consented to  Procedure(s): HIP DISARTICULATION (Right) as a surgical intervention.  The patient's history has been reviewed, patient examined, no change in status, stable for surgery.  I have reviewed the patient's chart and labs.  Questions were answered to the patient's satisfaction.     [name]

## 2019-04-05 NOTE — Progress Notes (Signed)
PROGRESS NOTE    Robin Snow  VHQ:[id number] DOB: 13-May-1940 DOA: 03/31/2019 PCP: [name], MD   Brief Narrative:  [name] a 78 y.o.femalewith medical history significant ofa.fib, HTN, CKD, stroke. Patient had R TKA in [place] in 2019. Patient recently admitted from 10/30-12/23 for dislocation of R knee prosthesis with septic joint. Closed reduction by Dr. Doreatha Martin 11/18. 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 Ultimately underwent R AKA by [name]. This complicated by sepsis from E.Coli bacteremia, spent several days in ICU on vent. Also complicated by AKA wound dehissance.Patient finally discharged 12/23 to [facility] on rocephin to complete 6 weeks of therapy with end date of 1/6.   For a full recounting of the prolonged, nearly 2 month hospital stay, please see Dr. Lisbeth Ply discharge summary on 12/23.  Per SNF: patient didn't get rocephin on (12/24). At around 7pm they noticed asmall amount ofbloodydrainage from dressingand noticedfoulodor from wound. At 745pm noticed dressing was saturated and at 9pm now oozing blood around dressing. Patient was noted to be running fever of 100.7 at facility. Facility confirms that coumadin is still on hold as per DC summary plan.  ED Course:No fever in ED, lab work actually looks fairly stable compared to 2 days ago.  CT of RLE femur is worrisome for hematoma with probable superimposed infection extending from amputation site.  She was admitted under hospital service with Ortho to consult.  She was given Rocephin and vancomycin in ED.  She is scheduled to have right hip disarticulation with [name].  She underwent hip disarticulation today in the OR and postoperatively she had some hemorrhagic shock and became hypotensive received 3 units of albumin Neo-Synephrine as well as 2 units of PRBCs and was transferred to the intensive care unit for further  monitoring   Assessment & Plan:   Principal Problem:   Wound dehiscence Active Problems:   Essential hypertension   AF (paroxysmal atrial fibrillation) (HCC)   Bacteremia due to Escherichia coli   S/P AKA (above knee amputation), right (HCC)   Cellulitis of right lower extremity   Wound dehiscence/infection of amputation site/right stump:  -Wound care on board.  Per Dr. Jess Barters consultation note, patient is scheduled to have right hip disarticulation and this was done today and postoperatively she was transferred to the ICU for post hemorrhagic shock -Has a wound VAC in place -Currently getting 2 units of PRBCs and will need a third one per note -PCCM has been consulted given her hemorrhagic shock  E. coli Bacteremia/Endocarditis:  -Continue Rocephin 2 g IV daily until 04/11/2018 per ID recommendations from recent hospitalization.  Permanent Atrial Fibrillation:  -Rates fluctuating but mostly controlled without symptoms.  Continue home dose of metoprolol, Cardizem and amiodarone if not hypotensive -Coumadin on hold due to hematoma at this time. Will resume anticoagulation once cleared by orthopedics.   Essential Hypertension: -Controlled. C/w Amiodarone,  -Hold Diltiazem and metoprolol she becomes hypotensive -Went into post hemorrhagic shock and had to be placed on Neo-Synephrine in the OR and given albumin and 2 units of PRBCs  Obesity -Estimated body mass index is 35.22 kg/m as calculated from the following:   Height as of this encounter: '5\' 7"'$  (1.702 m).   Weight as of this encounter: 102 kg. -Weight Loss and Dietary Counseling given   Hyperlipidemia -Continue pravastatin 20 g p.o. nightly  GERD  -Continue pantoprazole 40 mg p.o. daily  DVT prophylaxis: SCDs Code Status: FULL  CODE  Family Communication: No family present at bedside  Disposition Plan: She was transferred to the ICU for closer monitoring tonight given her hypotension in the OR   Consultants:     Orthopedic surgery  PCCM/pulmonary   Procedures:  HIP DISARTICULATION done by [name]    Antimicrobials:  Anti-infectives (From admission, onward)   Start     Dose/Rate Route Frequency Ordered Stop   04/05/19 1400  ceFAZolin (ANCEF) IVPB 2g/100 mL premix     2 g 200 mL/hr over 30 Minutes Intravenous On call to O.R. 04/05/19 0723 04/06/19 0559   04/01/19 0500  cefTRIAXone (ROCEPHIN) IVPB  Status:  Discontinued    Note to Pharmacy: Indication:  E Coli Endocarditis Last Day of Therapy:  04/12/2019 Labs - Once weekly:  CBC/D and BMP, Labs - Every other week:  ESR and CRP     2 g Intravenous Every 24 hours 03/31/19 0436 03/31/19 0500   04/01/19 0400  cefTRIAXone (ROCEPHIN) 2 g in sodium chloride 0.9 % 100 mL IVPB     2 g 200 mL/hr over 30 Minutes Intravenous Every 24 hours 03/31/19 0501 04/13/19 0359   03/31/19 0400  vancomycin (VANCOCIN) IVPB 1000 mg/200 mL premix     1,000 mg 200 mL/hr over 60 Minutes Intravenous  Once 03/31/19 0356 03/31/19 0606   03/31/19 0100  cefTRIAXone (ROCEPHIN) 2 g in sodium chloride 0.9 % 100 mL IVPB     2 g 200 mL/hr over 30 Minutes Intravenous  Once 03/31/19 0100 03/31/19 0329     Subjective: Seen and examined in the intensive care unit on 2 AM after her surgery and she was a little somnolent and only oriented x2.  Did not really answer questions.  Nursing states that she is getting her second unit of PRBCs.  Appears a little groggy after her surgery.  Objective: Vitals:   04/04/19 1254 04/04/19 2001 04/05/19 0500 04/05/19 0759  BP: 139/90 116/66 122/61 128/74  Pulse: 95 (!) 59 60 69  Resp: '18 16 15 15  '$ Temp: 98.9 F (37.2 C) 99.8 F (37.7 C) 99 F (37.2 C) 99.1 F (37.3 C)  TempSrc: Oral Oral Oral Oral  SpO2: 100% 100% 98% 98%  Weight:      Height:        Intake/Output Summary (Last 24 hours) at 04/05/2019 0848 Last data filed at 04/05/2019 0500 Gross per 24 hour  Intake 600 ml  Output 1100 ml  Net -500 ml   Filed Weights    04/01/19 1023  Weight: 102 kg   Examination: Physical Exam:  Constitutional: WN/WD obese African-American female currently in NAD and appears calm and comfortable Eyes: Had bilateral cataracts ENMT: External Ears, Nose appear normal. Grossly normal hearing  Neck: Appears normal, supple, no cervical masses, normal ROM, no appreciable thyromegaly; no JVD Respiratory: Mildly diminished to auscultation bilaterally, no wheezing, rales, rhonchi or crackles. Normal respiratory effort and patient is not tachypenic. No accessory muscle use.  Cardiovascular: Irregularly irregular an, no murmurs / rubs / gallops. S1 and S2 auscultated.  Abdomen: Soft, tender to palpate, Distended. Bowel sounds positive.  GU: Deferred. Musculoskeletal: Right hip has been disarticulated and has a wound VAC in place Skin: No rashes, lesions, ulcers. No induration; Warm and dry.  Neurologic: CN 2-12 grossly intact with no focal deficits on limited skin evaluation. Romberg sign and cerebellar reflexes not assessed.  Psychiatric: Impaired judgment and insight. Not alert and oriented x 3.   Data Reviewed: I have personally reviewed  following labs and imaging studies  CBC: Recent Labs  Lab 04/01/19 1517 04/02/19 0850 04/03/19 0312 04/04/19 0422 04/05/19 0448  WBC 6.5 6.8 6.6 6.3 9.7  NEUTROABS 3.2 3.1 3.5 3.0 5.7  HGB 8.7* 8.7* 8.8* 9.2* 9.5*  HCT 27.8* 28.1* 28.0* 29.0* 30.6*  MCV 94.9 96.6 95.6 95.1 95.3  PLT 238 258 248 251 638   Basic Metabolic Panel: Recent Labs  Lab 04/01/19 1517 04/02/19 0850 04/03/19 0312 04/03/19 0922 04/04/19 0422 04/05/19 0448  NA 138 140 138  --  139 139  K 3.3* 3.4* 3.4*  --  3.9 3.7  CL 102 103 103  --  103 103  CO2 '28 30 27  '$ --  27 26  GLUCOSE 103* 84 91  --  92 108*  BUN '13 12 12  '$ --  12 13  CREATININE 0.89 0.96 0.93  --  0.86 0.90  CALCIUM 8.0* 8.2* 8.0*  --  8.3* 8.4*  MG  --   --   --  1.7  --   --    GFR: Estimated Creatinine Clearance: 62.3 mL/min (by C-G  formula based on SCr of 0.9 mg/dL). Liver Function Tests: Recent Labs  Lab 03/31/19 0044  AST 27  ALT 22  ALKPHOS 118  BILITOT 1.3*  PROT 5.9*  ALBUMIN 2.1*   No results for input(s): LIPASE, AMYLASE in the last 168 hours. No results for input(s): AMMONIA in the last 168 hours. Coagulation Profile: Recent Labs  Lab 03/31/19 0044  INR 1.2   Cardiac Enzymes: No results for input(s): CKTOTAL, CKMB, CKMBINDEX, TROPONINI in the last 168 hours. BNP (last 3 results) No results for input(s): PROBNP in the last 8760 hours. HbA1C: No results for input(s): HGBA1C in the last 72 hours. CBG: No results for input(s): GLUCAP in the last 168 hours. Lipid Profile: No results for input(s): CHOL, HDL, LDLCALC, TRIG, CHOLHDL, LDLDIRECT in the last 72 hours. Thyroid Function Tests: No results for input(s): TSH, T4TOTAL, FREET4, T3FREE, THYROIDAB in the last 72 hours. Anemia Panel: No results for input(s): VITAMINB12, FOLATE, FERRITIN, TIBC, IRON, RETICCTPCT in the last 72 hours. Sepsis Labs: Recent Labs  Lab 03/31/19 0044  LATICACIDVEN 1.3    Recent Results (from the past 240 hour(s))  SARS CORONAVIRUS 2 (TAT 6-24 HRS) Nasopharyngeal Nasopharyngeal Swab     Status: None   Collection Time: 03/28/19  3:00 PM   Specimen: Nasopharyngeal Swab  Result Value Ref Range Status   SARS Coronavirus 2 NEGATIVE NEGATIVE Final    Comment: (NOTE) SARS-CoV-2 target nucleic acids are NOT DETECTED. The SARS-CoV-2 RNA is generally detectable in upper and lower respiratory specimens during the acute phase of infection. Negative results do not preclude SARS-CoV-2 infection, do not rule out co-infections with other pathogens, and should not be used as the sole basis for treatment or other patient management decisions. Negative results must be combined with clinical observations, patient history, and epidemiological information. The expected result is Negative. Fact Sheet for  Patients: SugarRoll.be Fact Sheet for Healthcare Providers: https://www.woods-mathews.com/ This test is not yet approved or cleared by the [place] FDA and  has been authorized for detection and/or diagnosis of SARS-CoV-2 by FDA under an Emergency Use Authorization (EUA). This EUA will remain  in effect (meaning this test can be used) for the duration of the COVID-19 declaration under Section 56 4(b)(1) of the Act, 21 U.S.C. section 360bbb-3(b)(1), unless the authorization is terminated or revoked sooner. Performed at [facility], [place]  [address] [place], [place] [address]   Blood culture (routine x 2)     Status: None (Preliminary result)   Collection Time: 03/31/19 12:44 AM   Specimen: BLOOD LEFT HAND  Result Value Ref Range Status   Specimen Description BLOOD LEFT HAND  Final   Special Requests   Final    BOTTLES DRAWN AEROBIC ONLY Blood Culture results may not be optimal due to an inadequate volume of blood received in culture bottles   Culture   Final    NO GROWTH 4 DAYS Performed at [facility], [place] [address] [place], [place] [address]    Report Status PENDING  Incomplete  Blood culture (routine x 2)     Status: None (Preliminary result)   Collection Time: 03/31/19 12:45 AM   Specimen: BLOOD  Result Value Ref Range Status   Specimen Description BLOOD LEFT ARM  Final   Special Requests   Final    BOTTLES DRAWN AEROBIC ONLY Blood Culture adequate volume   Culture   Final    NO GROWTH 4 DAYS Performed at [facility], [place] [address], [place], [place] [address]    Report Status PENDING  Incomplete  SARS CORONAVIRUS 2 (TAT 6-24 HRS) Nasopharyngeal Nasopharyngeal Swab     Status: None   Collection Time: 03/31/19  2:26 AM   Specimen: Nasopharyngeal Swab  Result Value Ref Range Status   SARS Coronavirus 2 NEGATIVE NEGATIVE Final    Comment: (NOTE) SARS-CoV-2 target nucleic acids are NOT DETECTED. The  SARS-CoV-2 RNA is generally detectable in upper and lower respiratory specimens during the acute phase of infection. Negative results do not preclude SARS-CoV-2 infection, do not rule out co-infections with other pathogens, and should not be used as the sole basis for treatment or other patient management decisions. Negative results must be combined with clinical observations, patient history, and epidemiological information. The expected result is Negative. Fact Sheet for Patients: SugarRoll.be Fact Sheet for Healthcare Providers: https://www.woods-mathews.com/ This test is not yet approved or cleared by the [place] FDA and  has been authorized for detection and/or diagnosis of SARS-CoV-2 by FDA under an Emergency Use Authorization (EUA). This EUA will remain  in effect (meaning this test can be used) for the duration of the COVID-19 declaration under Section 56 4(b)(1) of the Act, 21 U.S.C. section 360bbb-3(b)(1), unless the authorization is terminated or revoked sooner. Performed at [facility], [place] [address], [place], [place] [address]   Urine culture     Status: Abnormal   Collection Time: 03/31/19  2:43 AM   Specimen: Urine, Random  Result Value Ref Range Status   Specimen Description URINE, RANDOM  Final   Special Requests NONE  Final   Culture (A)  Final    <10,000 COLONIES/mL INSIGNIFICANT GROWTH Performed at [facility], [place] [address], [place],  [address]    Report Status 04/01/2019 FINAL  Final  Surgical pcr screen     Status: None   Collection Time: 04/04/19 11:13 PM   Specimen: Nasal Mucosa; Nasal Swab  Result Value Ref Range Status   MRSA, PCR NEGATIVE NEGATIVE Final   Staphylococcus aureus NEGATIVE NEGATIVE Final    Comment: (NOTE) The Xpert SA Assay (FDA approved for NASAL specimens in patients 35 years of age and older), is one component of a comprehensive surveillance program. It is not  intended to diagnose infection nor to guide or monitor treatment. Performed at [facility], [place] [address]  [place], [place] [address]      RN Pressure Injury Documentation: Pressure Injury 03/01/19 Buttocks Medial Stage II -  Partial thickness loss of dermis presenting as a shallow open ulcer with a red, pink wound bed without slough. (Active)  03/01/19 1732  Location: Buttocks  Location Orientation: Medial  Staging: Stage II -  Partial thickness loss of dermis presenting as a shallow open ulcer with a red, pink wound bed without slough.  Wound Description (Comments):   Present on Admission: Yes     Pressure Injury 03/03/19 Thigh Right Stage II -  Partial thickness loss of dermis presenting as a shallow open ulcer with a red, pink wound bed without slough. (Active)  03/03/19 1000  Location: Thigh  Location Orientation: Right  Staging: Stage II -  Partial thickness loss of dermis presenting as a shallow open ulcer with a red, pink wound bed without slough.  Wound Description (Comments):   Present on Admission:    Radiology Studies: No results found.  Scheduled Meds: . amiodarone  200 mg Oral Daily  . Chlorhexidine Gluconate Cloth  6 each Topical Daily  . diltiazem  120 mg Oral Daily  . ferrous sulfate  300 mg Oral BID WC  . gabapentin  300 mg Oral TID  . metoprolol tartrate  50 mg Oral BID  . pantoprazole  40 mg Oral Daily  . pravastatin  20 mg Oral q1800  . senna-docusate  2 tablet Oral BID  . sodium chloride flush  10-40 mL Intracatheter Q12H  . sodium chloride flush  3 mL Intravenous Once   Continuous Infusions: .  ceFAZolin (ANCEF) IV    . cefTRIAXone (ROCEPHIN)  IV 2 g (04/05/19 0325)    LOS: 5 days    Kerney Elbe, DO [facility] PAGER is on [place]  If 7PM-7AM, please contact night-coverage [email or web address]

## 2019-04-05 NOTE — Transfer of Care (Signed)
Immediate Anesthesia Transfer of Care Note  Patient: Robin Snow  Procedure(s) Performed: HIP DISARTICULATION (Right Hip)  Patient Location: PACU  Anesthesia Type:General  Level of Consciousness: awake  Airway & Oxygen Therapy: Patient Spontanous Breathing and Patient connected to nasal cannula oxygen  Post-op Assessment: Report given to RN  Post vital signs: Reviewed and stable  Last Vitals:  Vitals Value Taken Time  BP 97/51 04/05/19 1330  Temp    Pulse 60 04/05/19 1331  Resp 15 04/05/19 1331  SpO2 100 % 04/05/19 1331  Vitals shown include unvalidated device data.  Last Pain:  Vitals:   04/05/19 0759  TempSrc: Oral  PainSc:       Patients Stated Pain Goal: 3 (30/94/07 6808)  Complications: No apparent anesthesia complications

## 2019-04-05 NOTE — Op Note (Signed)
04/05/2019  12:41 PM  PATIENT:  Robin Snow    PRE-OPERATIVE DIAGNOSIS:  Wound Dehiscence Right Above Knee Amputation  POST-OPERATIVE DIAGNOSIS:  Same  PROCEDURE:  HIP DISARTICULATION  SURGEON:  [name], MD  PHYSICIAN ASSISTANT:None ANESTHESIA:   General  PREOPERATIVE INDICATIONS:  Robin Snow is a  78 y.o. female with a diagnosis of Wound Dehiscence Right Above Knee Amputation who failed conservative measures and elected for surgical management.    The risks benefits and alternatives were discussed with the patient preoperatively including but not limited to the risks of infection, bleeding, nerve injury, cardiopulmonary complications, the need for revision surgery, among others, and the patient was willing to proceed.  OPERATIVE IMPLANTS: None  @ENCIMAGES @  OPERATIVE FINDINGS: Necrotic muscles that extended into the pelvis including the gluteus muscles short external rotators iliopsoas muscles necrotic  OPERATIVE PROCEDURE: Patient was brought the operating room and underwent a general anesthetic.  After adequate levels anesthesia were obtained patient was placed in a floppy lateral position with the right lower extremity bumped up the right lower extremity was then prepped using DuraPrep draped into a sterile field the dehisced wound was covered with towels.  A teardrop incision was made centered on the anterior superior iliac spine.  This was carried sharply down to the femoral vessels these were clamped and suture ligated with 2-0 silk.  The remainder the amputation was completed with disarticulation through the hip.  There was bleeding from the sciatic nerve and hemostasis was obtained with electrocautery.  The wound was irrigated with normal saline.  Patient had extensive necrosis of the gluteus muscles short external rotators and iliopsoas these were excised with a rondure and scissors.  This was debrided back to viable muscle margins.  The wound was further  irrigated with normal saline.  The wound was closed with staples.  Patient was hypotensive had received 3 units of albumin and Neo-Synephrine.  Patient was to remain in the operating room until her blood products were available.   DISCHARGE PLANNING:  Antibiotic duration: Continue IV antibiotics  Weightbearing: Not applicable  Pain medication: Opioid pathway  Dressing care/ Wound VAC: Wound VAC  Ambulatory devices: Not applicable  Discharge to: Skilled nursing  Follow-up: In the office 1 week post operative.

## 2019-04-05 NOTE — Anesthesia Preprocedure Evaluation (Addendum)
Anesthesia Evaluation  Patient identified by MRN, date of birth, ID band Patient awake    Reviewed: Allergy & Precautions, NPO status , Patient's Chart, lab work & pertinent test results, reviewed documented beta blocker date and time   History of Anesthesia Complications Negative for: history of anesthetic complications  Airway Mallampati: I  TM Distance: >3 FB Neck ROM: Full    Dental  (+) Edentulous Upper, Edentulous Lower   Pulmonary sleep apnea , former smoker,  03/31/2019 SARS coronavirus NEG   breath sounds clear to auscultation       Cardiovascular hypertension, Pt. on medications and Pt. on home beta blockers + angina + dysrhythmias Atrial Fibrillation + pacemaker  Rhythm:Regular Rate:Normal  03/08/2019 ECHO: EF 60 to 65%. mildly increased left ventricular hypertroph, normal RV, severely dilated LA, mildly dilated RA, mild MR, trivial TR  *Aortic valve is tricuspid with vegetation suspected on the noncoronary cusp, trivial AI   Neuro/Psych negative neurological ROS     GI/Hepatic Neg liver ROS, GERD  Controlled,  Endo/Other  diabetes (glu 104)Morbid obesity  Renal/GU Renal InsufficiencyRenal disease     Musculoskeletal   Abdominal (+) + obese,   Peds  Hematology  (+) Blood dyscrasia (Hb 9.5), anemia , Coumadin: INR 1.2 (03/31/2019)   Anesthesia Other Findings   Reproductive/Obstetrics                            Anesthesia Physical Anesthesia Plan  ASA: III  Anesthesia Plan: General   Post-op Pain Management:    Induction: Intravenous  PONV Risk Score and Plan: 3 and Ondansetron, Dexamethasone and Treatment may vary due to age or medical condition  Airway Management Planned: Oral ETT  Additional Equipment: None  Intra-op Plan:   Post-operative Plan: Extubation in OR  Informed Consent: I have reviewed the patients History and Physical, chart, labs and discussed the  procedure including the risks, benefits and alternatives for the proposed anesthesia with the patient or authorized representative who has indicated his/her understanding and acceptance.     Dental advisory given  Plan Discussed with: CRNA and Surgeon  Anesthesia Plan Comments: (Plan routine monitors, GETA )       Anesthesia Quick Evaluation

## 2019-04-05 NOTE — Plan of Care (Signed)
  Problem: Education: Goal: Knowledge of the prescribed therapeutic regimen will improve Outcome: Progressing   Problem: Safety: Goal: Ability to remain free from injury will improve Outcome: Progressing

## 2019-04-06 DIAGNOSIS — M60051 Infective myositis, right thigh: Secondary | ICD-10-CM

## 2019-04-06 DIAGNOSIS — I480 Paroxysmal atrial fibrillation: Secondary | ICD-10-CM

## 2019-04-06 DIAGNOSIS — B9620 Unspecified Escherichia coli [E. coli] as the cause of diseases classified elsewhere: Secondary | ICD-10-CM

## 2019-04-06 DIAGNOSIS — D62 Acute posthemorrhagic anemia: Secondary | ICD-10-CM

## 2019-04-06 DIAGNOSIS — A419 Sepsis, unspecified organism: Secondary | ICD-10-CM

## 2019-04-06 DIAGNOSIS — R7881 Bacteremia: Secondary | ICD-10-CM

## 2019-04-06 LAB — PHOSPHORUS: Phosphorus: 3.5 mg/dL (ref 2.5–4.6)

## 2019-04-06 LAB — CBC WITH DIFFERENTIAL/PLATELET
Abs Immature Granulocytes: 0.67 10*3/uL — ABNORMAL HIGH (ref 0.00–0.07)
Basophils Absolute: 0.1 10*3/uL (ref 0.0–0.1)
Basophils Relative: 0 %
Eosinophils Absolute: 0 10*3/uL (ref 0.0–0.5)
Eosinophils Relative: 0 %
HCT: 28 % — ABNORMAL LOW (ref 36.0–46.0)
Hemoglobin: 9.5 g/dL — ABNORMAL LOW (ref 12.0–15.0)
Immature Granulocytes: 5 %
Lymphocytes Relative: 13 %
Lymphs Abs: 1.8 10*3/uL (ref 0.7–4.0)
MCH: 31.1 pg (ref 26.0–34.0)
MCHC: 33.9 g/dL (ref 30.0–36.0)
MCV: 91.8 fL (ref 80.0–100.0)
Monocytes Absolute: 1.1 10*3/uL — ABNORMAL HIGH (ref 0.1–1.0)
Monocytes Relative: 8 %
Neutro Abs: 9.8 10*3/uL — ABNORMAL HIGH (ref 1.7–7.7)
Neutrophils Relative %: 74 %
Platelets: 173 10*3/uL (ref 150–400)
RBC: 3.05 MIL/uL — ABNORMAL LOW (ref 3.87–5.11)
RDW: 14.9 % (ref 11.5–15.5)
WBC: 13.4 10*3/uL — ABNORMAL HIGH (ref 4.0–10.5)
nRBC: 0 % (ref 0.0–0.2)

## 2019-04-06 LAB — COMPREHENSIVE METABOLIC PANEL
ALT: 12 U/L (ref 0–44)
AST: 13 U/L — ABNORMAL LOW (ref 15–41)
Albumin: 2.2 g/dL — ABNORMAL LOW (ref 3.5–5.0)
Alkaline Phosphatase: 81 U/L (ref 38–126)
Anion gap: 7 (ref 5–15)
BUN: 15 mg/dL (ref 8–23)
CO2: 27 mmol/L (ref 22–32)
Calcium: 8.6 mg/dL — ABNORMAL LOW (ref 8.9–10.3)
Chloride: 106 mmol/L (ref 98–111)
Creatinine, Ser: 0.83 mg/dL (ref 0.44–1.00)
GFR calc Af Amer: >60 mL/min (ref >60)
GFR calc non Af Amer: >60 mL/min (ref >60)
Glucose, Bld: 138 mg/dL — ABNORMAL HIGH (ref 70–99)
Potassium: 4 mmol/L (ref 3.5–5.1)
Sodium: 140 mmol/L (ref 135–145)
Total Bilirubin: 2.2 mg/dL — ABNORMAL HIGH (ref 0.3–1.2)
Total Protein: 4.8 g/dL — ABNORMAL LOW (ref 6.5–8.1)

## 2019-04-06 LAB — GLUCOSE, CAPILLARY
Glucose-Capillary: 136 mg/dL — ABNORMAL HIGH (ref 70–99)
Glucose-Capillary: 122 mg/dL — ABNORMAL HIGH (ref 70–99)
Glucose-Capillary: 128 mg/dL — ABNORMAL HIGH (ref 70–99)
Glucose-Capillary: 149 mg/dL — ABNORMAL HIGH (ref 70–99)

## 2019-04-06 LAB — HEMOGLOBIN AND HEMATOCRIT, BLOOD
HCT: 31.7 % — ABNORMAL LOW (ref 36.0–46.0)
Hemoglobin: 10.6 g/dL — ABNORMAL LOW (ref 12.0–15.0)

## 2019-04-06 LAB — MAGNESIUM: Magnesium: 1.5 mg/dL — ABNORMAL LOW (ref 1.7–2.4)

## 2019-04-06 LAB — SURGICAL PATHOLOGY

## 2019-04-06 MED ORDER — GABAPENTIN 300 MG/6ML PO SOLN
300.0000 mg | Freq: Two times a day (BID) | ORAL | Status: DC
  Administered 2019-04-06 – 2019-04-10 (×6): 300 mg via ORAL
  Filled 2019-04-06 (×11): qty 6

## 2019-04-06 MED ORDER — DOCUSATE SODIUM 50 MG/5ML PO LIQD
100.0000 mg | Freq: Two times a day (BID) | ORAL | Status: DC
  Administered 2019-04-06 – 2019-04-17 (×9): 100 mg via ORAL
  Filled 2019-04-06 (×13): qty 10

## 2019-04-06 MED ORDER — MAGNESIUM SULFATE 2 GM/50ML IV SOLN
2.0000 g | Freq: Once | INTRAVENOUS | Status: AC
  Administered 2019-04-06: 2 g via INTRAVENOUS
  Filled 2019-04-06: qty 50

## 2019-04-06 MED ORDER — ADULT MULTIVITAMIN W/MINERALS CH
1.0000 | ORAL_TABLET | Freq: Every day | ORAL | Status: DC
  Administered 2019-04-06 – 2019-04-17 (×10): 1 via ORAL
  Filled 2019-04-06 (×12): qty 1

## 2019-04-06 MED ORDER — HEPARIN SODIUM (PORCINE) 5000 UNIT/ML IJ SOLN
5000.0000 [IU] | Freq: Three times a day (TID) | INTRAMUSCULAR | Status: DC
  Administered 2019-04-06 – 2019-04-08 (×6): 5000 [IU] via SUBCUTANEOUS
  Filled 2019-04-06 (×5): qty 1

## 2019-04-06 MED ORDER — ENSURE ENLIVE PO LIQD
237.0000 mL | Freq: Three times a day (TID) | ORAL | Status: DC
  Administered 2019-04-06 – 2019-04-12 (×11): 237 mL via ORAL

## 2019-04-06 MED ORDER — PANTOPRAZOLE SODIUM 40 MG IV SOLR
40.0000 mg | INTRAVENOUS | Status: DC
  Administered 2019-04-07 – 2019-04-08 (×2): 40 mg via INTRAVENOUS
  Filled 2019-04-06 (×2): qty 40

## 2019-04-06 NOTE — Evaluation (Signed)
Physical Therapy Evaluation Patient Details Name: Robin Snow MRN: [id number] DOB: [birth year] Today's Date: 04/06/2019   History of Present Illness  PT adm with wound dehiscence/infection of rt AKA. Underwent rt hip disarticulation on 12/30. PMH - Hospitalized from 10/29-11/4 with sepsis related to E. coli bacteremia, UTI, and bacterial arthritis right knee. Found to have dislocated right knee prothestic--02/22/19 s/p Closed reduction of right knee prosthetic dislocation. 11/25 Rt AKA. Afib, CKD, HTN, DM  Clinical Impression  Pt with severe limitations in mobility. Expect very slow progress. Recommend return to SNF.     Follow Up Recommendations SNF;Supervision/Assistance - 24 hour    Equipment Recommendations  Other (comment)(To be determined at next venue)    Recommendations for Other Services       Precautions / Restrictions Precautions Precautions: Fall Restrictions Weight Bearing Restrictions: Yes RLE Weight Bearing: Non weight bearing      Mobility  Bed Mobility Overal bed mobility: Needs Assistance Bed Mobility: Rolling Rolling: +2 for physical assistance;Total assist            Transfers                    Ambulation/Gait                Stairs            Wheelchair Mobility    Modified Rankin (Stroke Patients Only)       Balance                                             Pertinent Vitals/Pain Pain Assessment: Faces Faces Pain Scale: Hurts whole lot Pain Location: rt surgical site and LLE Pain Descriptors / Indicators: Grimacing;Guarding Pain Intervention(s): Limited activity within patient's tolerance;Monitored during session;Repositioned    Home Living Family/patient expects to be discharged to:: Skilled nursing facility                      Prior Function Level of Independence: Needs assistance   Gait / Transfers Assistance Needed: Only sitting EOB last admission           Hand  Dominance   Dominant Hand: Right    Extremity/Trunk Assessment   Upper Extremity Assessment Upper Extremity Assessment: Defer to OT evaluation    Lower Extremity Assessment Lower Extremity Assessment: RLE deficits/detail;LLE deficits/detail RLE Deficits / Details: hip disarticulation LLE Deficits / Details: Rigid and pt not following commands to assist with movement       Communication   Communication: No difficulties  Cognition Arousal/Alertness: Awake/alert Behavior During Therapy: Flat affect Overall Cognitive Status: Difficult to assess                                 General Comments: Very difficult to get her to answer any questions.      General Comments      Exercises     Assessment/Plan    PT Assessment Patient needs continued PT services  PT Problem List Decreased strength;Decreased range of motion;Decreased activity tolerance;Decreased balance;Decreased mobility;Decreased cognition;Obesity;Pain       PT Treatment Interventions Functional mobility training;Therapeutic activities;Therapeutic exercise;Balance training;Patient/family education    PT Goals (Current goals can be found in the Care Plan section)  Acute Rehab PT Goals Patient Stated Goal: not stated PT Goal  Formulation: With patient Time For Goal Achievement: 04/20/19 Potential to Achieve Goals: Fair    Frequency Min 2X/week   Barriers to discharge        Co-evaluation               AM-PAC PT "6 Clicks" Mobility  Outcome Measure Help needed turning from your back to your side while in a flat bed without using bedrails?: Total Help needed moving from lying on your back to sitting on the side of a flat bed without using bedrails?: Total Help needed moving to and from a bed to a chair (including a wheelchair)?: Total Help needed standing up from a chair using your arms (e.g., wheelchair or bedside chair)?: Total Help needed to walk in hospital room?: Total Help needed  climbing 3-5 steps with a railing? : Total 6 Click Score: 6    End of Session   Activity Tolerance: Patient limited by pain Patient left: in bed;with call bell/phone within reach;with bed alarm set Nurse Communication: Mobility status;Need for lift equipment PT Visit Diagnosis: Other abnormalities of gait and mobility (R26.89);Pain Pain - Right/Left: Right Pain - part of body: Hip    Time: [id number] PT Time Calculation (min) (ACUTE ONLY): 15 min   Charges:   PT Evaluation $PT Eval Moderate Complexity: 1 [place] Pager [phone] Office [place] 04/06/2019, 6:50 PM

## 2019-04-06 NOTE — Progress Notes (Addendum)
Initial Nutrition Assessment  DOCUMENTATION CODES:   Obesity unspecified, Severe malnutrition in context of acute illness/injury  INTERVENTION:    Ensure Enlive po TID, each supplement provides 350 kcal and 20 grams of protein.  Magic cup TID with meals, each supplement provides 290 kcal and 9 grams of protein.  MVI with minerals daily.  Recommend liberalize diet to Regular to help improve PO intake.  NUTRITION DIAGNOSIS:   Severe Malnutrition related to acute illness(Recent AKA with wound dehiscence) as evidenced by moderate fat depletion, moderate muscle depletion.  GOAL:   Patient will meet greater than or equal to 90% of their needs  MONITOR:   PO intake, Supplement acceptance, Labs, Skin  REASON FOR ASSESSMENT:   Consult Wound healing  ASSESSMENT:   78 yo female admitted with R AKA dehiscence. S/P R hip disarticulation on 12/30. PMH includes A fib, HTN, CKD, DM, anemia, GIB, OSA, stroke.   Patient reports that she has been eating okay. She "forces it down" even though she is not hungry. She likes all flavors of Ensure supplements.  Patient is currently on a heart healthy diet. She has been consuming </= 50% of most meals since admission.  Patient had extensive necrotic muscles originating in the pelvis. She will require long-term antibiotics.  Labs reviewed. Magnesium 1.5 (L) CBG's: [phone]  Medications reviewed and include Colace, ferrous sulfate, Senokot-S, IV Rocephin.  Patient with recent 15% weight loss, likely mostly related to amputation. Patient has had a lot of muscle and fat wasting over the past month. Last nutrition focused physical exam on 03/10/2019 showed no muscle or fat depletion. Now with moderate fat and muscle depletions in multiple areas.  NUTRITION - FOCUSED PHYSICAL EXAM:    Most Recent Value  Orbital Region  Moderate depletion  Upper Arm Region  No depletion  Thoracic and Lumbar Region  No depletion  Buccal Region  Moderate  depletion  Temple Region  Moderate depletion  Clavicle Bone Region  Severe depletion  Clavicle and Acromion Bone Region  Moderate depletion  Scapular Bone Region  Moderate depletion  Dorsal Hand  No depletion  Patellar Region  No depletion  Anterior Thigh Region  No depletion  Posterior Calf Region  No depletion  Edema (RD Assessment)  Moderate  Hair  Reviewed  Eyes  Reviewed  Mouth  Reviewed  Skin  Reviewed  Nails  Reviewed       Diet Order:   Diet Order            Diet Heart Room service appropriate? Yes; Fluid consistency: Thin  Diet effective now              EDUCATION NEEDS:   Not appropriate for education at this time  Skin:  Skin Assessment: Skin Integrity Issues: Skin Integrity Issues:: Stage II, Incisions, Other (Comment) Stage II: buttocks Incisions: R hip disarticulation with VAC Other: MASD to buttocks and perineum  Last BM:  12/29  Height:   Ht Readings from Last 1 Encounters:  04/01/19 5\' 7"  (1.702 m)    Weight:   Wt Readings from Last 1 Encounters:  04/05/19 87.5 kg    Ideal Body Weight:  50 kg(adjusted for R leg amputation)  BMI:  37.1 (adjusted for R leg amputation  Estimated Nutritional Needs:   Kcal:  1900-2100  Protein:  100-120 gm  Fluid:  >/= 1.9 L    Molli Barrows, RD, LDN, CNSC Pager [phone] After Hours Pager [phone]

## 2019-04-06 NOTE — Progress Notes (Signed)
Patient ID: Robin Snow, female   DOB: 31-Aug-1940, 78 y.o.   MRN: [id number] Patient is postoperative day 1 right hip disarticulation.  Hemoglobin this morning is 9.8.  Patient is alert and oriented this morning.  Approximately 800 cc of drainage from surgery.  The wound VAC was not plugged in and not working when patient was seen this morning.  The unit was plugged in and and appears to have a good seal with clear serosanguineous drainage.  Patient had extensive necrotic muscles including muscles originating in the pelvis.  Tissues were sent for cultures patient will need long-term antibiotics.

## 2019-04-06 NOTE — Progress Notes (Signed)
PROGRESS NOTE    Robin Snow  KZS:[id number] DOB: 1940-06-27 DOA: 03/31/2019 PCP: [name], MD   Brief Narrative:  Robin Snow a 78 y.o.femalewith medical history significant ofa.fib, HTN, CKD, stroke. Patient had R TKA in [place] in 2019. Patient recently admitted from 10/30-12/23 for dislocation of R knee prosthesis with septic joint. Closed reduction by Dr. Doreatha Martin 11/18. 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 Ultimately underwent R AKA by [name]. This complicated by sepsis from E.Coli bacteremia, spent several days in ICU on vent. Also complicated by AKA wound dehissance.Patient finally discharged 12/23 [facility] on rocephin to complete 6 weeks of therapy with end date of 1/6.   For a full recounting of the prolonged, nearly 2 month hospital stay, please see Dr. Lisbeth Ply discharge summary on 12/23.  Per SNF: patient didn't get rocephin on (12/24). At around 7pm they noticed asmall amount ofbloodydrainage from dressingand noticedfoulodor from wound. At 745pm noticed dressing was saturated and at 9pm now oozing blood around dressing. Patient was noted to be running fever of 100.7 at facility. Facility confirms that coumadin is still on hold as per DC summary plan.  ED Course:No fever in ED, lab work actually looks fairly stable compared to 2 days ago.  CT of RLE femur is worrisome for hematoma with probable superimposed infection extending from amputation site.  She was admitted under hospital service with Ortho to consult.  She was given Rocephin and vancomycin in ED.  She is scheduled to have right hip disarticulation with [name].  She underwent hip disarticulation today in the OR and postoperatively she had some hemorrhagic shock and became hypotensive received 3 units of albumin Neo-Synephrine as well as 3 units of PRBCs and was transferred to the intensive care unit for further  monitoring.   Assessment & Plan:   Principal Problem:   Wound dehiscence Active Problems:   Essential hypertension   AF (paroxysmal atrial fibrillation) (HCC)   Bacteremia due to Escherichia coli   S/P AKA (above knee amputation), right (HCC)   Cellulitis of right lower extremity   Dehiscence of amputation stump (HCC)   Wound dehiscence/infection of amputation site/right stump status post hip disarticulation postoperative day 1 -Wound care on board.  Per Dr. Jess Barters consultation note, patient is scheduled to have right hip disarticulation and this was done yesterday and postoperatively she was transferred to the ICU for post hemorrhagic shock this is now improved and will be transferred to the progressive care unit -Has a wound VAC in place -Currently getting 2 units of PRBCs and will need a third one per note; she is status post 3 units of PRBCs now -PCCM has been consulted given her hemorrhagic shock but she is hemodynamically stable -Further care per orthopedic surgery -Currently on IV Dilaudid 0.5 mg every 4 as needed for severe pain, p.o. oxycodone IR and oxycodone acetaminophen 1 tab p.o. twice daily as needed Bowel regimen in place -PCCM was consulted for hemorrhagic shock but she is stable and she is being transferred to the stepdown unit now -Per orthopedic surgery the patient had extensive necrotic muscles including muscles originating in the pelvis and tissue were sent for cultures and patient will need long-term antibiotics and she is currently on Rocephin 2 g IV daily and may need further adjustments based on her cultures and sensitivities  Acute Blood Loss Anemia from Surgical Intervention -Patient's hemoglobin/hematocrit went from 9.5/30.6 and dropped down to 7.1/21.01 and  was transfused 3 units of PRBCs -BP after transfusion was 10.6/31.7 and now this a.m. was 9.5/2 8.0 -Continue to monitor for signs and symptoms of bleeding at time of expected postoperative drop given  the complexity of her surgery -Continue with iron supplementation ferrous sulfate 300 mg/5 mL p.o. twice daily -Continue to monitor for signs and symptoms of bleeding; currently no overt bleeding noted -Has a wound VAC in place and had approximately 800 cc of drainage from surgery  E. coli Bacteremia/Endocarditis:  -Continue Rocephin 2 g IV daily until 04/11/2018 per ID recommendations from recent hospitalization.  Permanent Atrial Fibrillation was in sinus tachycardia this morning -Rates fluctuating but mostly controlled without symptoms and they were ranging in the 120s this morning.   Resume home dose of metoprolol, Cardizem and amiodarone  -Coumadin on hold due to hematoma at this time. Will resume anticoagulation once cleared by orthopedics.   Essential Hypertension: -Controlled. C/w Amiodarone,  -Initially held diltiazem and metoprolol she becomes hypotensive but resumed today -Went into post hemorrhagic shock and had to be placed on Neo-Synephrine in the OR and given albumin and 3 units of PRBCs -Pressures have improved and was 147/85 -Resume antihypertensives  Obesity Severe malnutrition in the context of acute illness/injury -Estimated body mass index is 30.21 kg/m as calculated from the following:   Height as of this encounter: _0  (1.702 m).   Weight as of this encounter: 87.5 kg. -Weight Loss and Dietary Counseling given  -Patient is consulted for further evaluation recommendations and recommending Ensure Enlive p.o. 3 times daily along with Magic cup 3 times daily with meals and multivitamin with minerals daily as well as liberalizing diet to help improve p.o. intake  Hyperlipidemia -Continue Pravastatin 20 g p.o. nightly  GERD  -Continue Pantoprazole 40 mg p.o. daily  Hypomagnesemia -Patient's Mag Level was 1.5 -Replete with IV Mag Sulfate 2 grams -Continue to Monitor and Replete as Necessary -Repeat Mag Level in the AM  Hyperbilirubinemia -Patient's T Bili  was 2.2 -Likely Reactive -Continue to Monitor and Trend -Repeat CMP in AM   Leukocytosis -Likely reactive in the setting of Surgical intervention and Hip Disarticulation -Patients WBC went from  9.7 -> 13.4 -Continue to Monitor for S/Sx of Infection -Repeat CBC in AM   DVT prophylaxis: SCDs; resume Coumadin when okay with orthopedic surgery Code Status: FULL CODE  Family Communication: No family present at bedside  Disposition Plan: She was transferred to the ICU for closer monitoring yesterday given her hypotension in the OR but she is hemodynamically stable and will be transferred to the progressive care unit today  Consultants:  Orthopedic Surgery  PCCM/Pulmonary   Procedures:  HIP DISARTICULATION done by [name]    Antimicrobials:  Anti-infectives (From admission, onward)   Start     Dose/Rate Route Frequency Ordered Stop   04/05/19 1400  ceFAZolin (ANCEF) IVPB 2g/100 mL premix     2 g 200 mL/hr over 30 Minutes Intravenous On call to O.R. 04/05/19 0723 04/05/19 1100   04/01/19 0500  cefTRIAXone (ROCEPHIN) IVPB  Status:  Discontinued    Note to Pharmacy: Indication:  E Coli Endocarditis Last Day of Therapy:  04/12/2019 Labs - Once weekly:  CBC/D and BMP, Labs - Every other week:  ESR and CRP     2 g Intravenous Every 24 hours 03/31/19 0436 03/31/19 0500   04/01/19 0400  cefTRIAXone (ROCEPHIN) 2 g in sodium chloride 0.9 % 100 mL IVPB     2 g 200 mL/hr over  30 Minutes Intravenous Every 24 hours 03/31/19 0501 04/13/19 0359   03/31/19 0400  vancomycin (VANCOCIN) IVPB 1000 mg/200 mL premix     1,000 mg 200 mL/hr over 60 Minutes Intravenous  Once 03/31/19 0356 03/31/19 0606   03/31/19 0100  cefTRIAXone (ROCEPHIN) 2 g in sodium chloride 0.9 % 100 mL IVPB     2 g 200 mL/hr over 30 Minutes Intravenous  Once 03/31/19 0100 03/31/19 0329     Subjective: Seen and examined at bedside and she is complaining of being "cold." No nausea or Vomiting. HR high but is Sinus  Tachycardic.  Denies any other concerns or plans at this time.  No nausea or vomiting.  She has been stable overnight after her blood transfusion and will be transferred to the stepdown unit.  Objective: Vitals:   04/06/19 0400 04/06/19 0500 04/06/19 0600 04/06/19 0734  BP: (!) 144/65 124/78 (!) 147/85   Pulse: 62 (!) 115 (!) 101   Resp: _0 Temp:    (!) 97.4 F (36.3 C)  TempSrc:    Oral  SpO2: 99% 97% 98%   Weight:      Height:        Intake/Output Summary (Last 24 hours) at 04/06/2019 2426 Last data filed at 04/06/2019 0600 Gross per 24 hour  Intake 2950.8 ml  Output 2100 ml  Net 850.8 ml   Filed Weights   04/01/19 1023 04/05/19 1705  Weight: 102 kg 87.5 kg   Examination: Physical Exam:  Constitutional: WN/WD obese AAF in NAD but does appear anxious and appears slightly uncomfortable Eyes: Has some cataracts  ENMT: External Ears, Nose appear normal. Grossly normal hearing. Mucous membranes are moist.  Neck: Appears normal, supple, no cervical masses, normal ROM, no appreciable thyromegaly; no appreciable JVD Respiratory: Mildly to auscultation bilaterally, no wheezing, rales, rhonchi or crackles. Normal respiratory effort and patient is not tachypenic. No accessory muscle use.  Cardiovascular: Tachycardic Rate but regular Rhythm, no murmurs / rubs / gallops. S1 and S2 auscultated. 1+ extremity edema in Left Leg Abdomen: Soft,Tender, Distended due to body habitus. Bowel sounds positive. Has a Wound Vac in place  GU: Deferred. Musculoskeletal: Right Hip has ben disarticulated and has a wound VAC in place  Skin: No rashes, lesions, ulcers on a limited skin evaluation. No induration; Warm and dry.  Neurologic: CN 2-12 grossly intact with no focal deficits. Romberg sign and cerebellar reflexes not assessed.  Psychiatric: Slightly impaired judgment and insight. Alert and oriented x 2. Anxious mood and appropriate affect.   Data Reviewed: I have personally reviewed  following labs and imaging studies  CBC: Recent Labs  Lab 04/02/19 0850 04/03/19 0312 04/04/19 0422 04/05/19 0448 04/05/19 1211 04/05/19 1504 04/05/19 2152 04/06/19 0525  WBC 6.8 6.6 6.3 9.7  --   --   --  13.4*  NEUTROABS 3.1 3.5 3.0 5.7  --   --   --  9.8*  HGB 8.7* 8.8* 9.2* 9.5* 7.1* 7.5* 10.6* 9.5*  HCT 28.1* 28.0* 29.0* 30.6* 21.0* 22.0* 31.7* 28.0*  MCV 96.6 95.6 95.1 95.3  --   --   --  91.8  PLT 258 248 251 221  --   --   --  834   Basic Metabolic Panel: Recent Labs  Lab 04/02/19 0850 04/03/19 0312 04/03/19 0922 04/04/19 0422 04/05/19 0448 04/05/19 1211 04/05/19 1504 04/06/19 0525  NA 140 138  --  139 139 139 141 140  K 3.4* 3.4*  --  3.9 3.7 4.2 4.1 4.0  CL 103 103  --  103 103  --   --  106  CO2 30 27  --  27 26  --   --  27  GLUCOSE 84 91  --  92 108*  --   --  138*  BUN 12 12  --  12 13  --   --  15  CREATININE 0.96 0.93  --  0.86 0.90  --   --  0.83  CALCIUM 8.2* 8.0*  --  8.3* 8.4*  --   --  8.6*  MG  --   --  1.7  --   --   --   --  1.5*  PHOS  --   --   --   --   --   --   --  3.5   GFR: Estimated Creatinine Clearance: 62.5 mL/min (by C-G formula based on SCr of 0.83 mg/dL). Liver Function Tests: Recent Labs  Lab 03/31/19 0044 04/06/19 0525  AST 27 13*  ALT 22 12  ALKPHOS 118 81  BILITOT 1.3* 2.2*  PROT 5.9* 4.8*  ALBUMIN 2.1* 2.2*   No results for input(s): LIPASE, AMYLASE in the last 168 hours. No results for input(s): AMMONIA in the last 168 hours. Coagulation Profile: Recent Labs  Lab 03/31/19 0044  INR 1.2   Cardiac Enzymes: No results for input(s): CKTOTAL, CKMB, CKMBINDEX, TROPONINI in the last 168 hours. BNP (last 3 results) No results for input(s): PROBNP in the last 8760 hours. HbA1C: No results for input(s): HGBA1C in the last 72 hours. CBG: Recent Labs  Lab 04/05/19 1700 04/05/19 1924 04/05/19 2311 04/06/19 0308 04/06/19 0727  GLUCAP 138* 146* 141* 136* 122*   Lipid Profile: No results for input(s): CHOL,  HDL, LDLCALC, TRIG, CHOLHDL, LDLDIRECT in the last 72 hours. Thyroid Function Tests: No results for input(s): TSH, T4TOTAL, FREET4, T3FREE, THYROIDAB in the last 72 hours. Anemia Panel: No results for input(s): VITAMINB12, FOLATE, FERRITIN, TIBC, IRON, RETICCTPCT in the last 72 hours. Sepsis Labs: Recent Labs  Lab 03/31/19 0044  LATICACIDVEN 1.3    Recent Results (from the past 240 hour(s))  SARS CORONAVIRUS 2 (TAT 6-24 HRS) Nasopharyngeal Nasopharyngeal Swab     Status: None   Collection Time: 03/28/19  3:00 PM   Specimen: Nasopharyngeal Swab  Result Value Ref Range Status   SARS Coronavirus 2 NEGATIVE NEGATIVE Final    Comment: (NOTE) SARS-CoV-2 target nucleic acids are NOT DETECTED. The SARS-CoV-2 RNA is generally detectable in upper and lower respiratory specimens during the acute phase of infection. Negative results do not preclude SARS-CoV-2 infection, do not rule out co-infections with other pathogens, and should not be used as the sole basis for treatment or other patient management decisions. Negative results must be combined with clinical observations, patient history, and epidemiological information. The expected result is Negative. Fact Sheet for Patients: SugarRoll.be Fact Sheet for Healthcare Providers: https://www.woods-mathews.com/ This test is not yet approved or cleared by the [place] FDA and  has been authorized for detection and/or diagnosis of SARS-CoV-2 by FDA under an Emergency Use Authorization (EUA). This EUA will remain  in effect (meaning this test can be used) for the duration of the COVID-19 declaration under Section 56 4(b)(1) of the Act, 21 U.S.C. section 360bbb-3(b)(1), unless the authorization is terminated or revoked sooner. Performed at [facility], [place] [address] [place], [place] [address]   Blood culture (routine x 2)  Status: None   Collection Time: 03/31/19 12:44 AM   Specimen:  BLOOD LEFT HAND  Result Value Ref Range Status   Specimen Description BLOOD LEFT HAND  Final   Special Requests   Final    BOTTLES DRAWN AEROBIC ONLY Blood Culture results may not be optimal due to an inadequate volume of blood received in culture bottles   Culture   Final    NO GROWTH 5 DAYS Performed at [facility], [place] [address] [place], [place] [address]    Report Status 04/05/2019 FINAL  Final  Blood culture (routine x 2)     Status: None   Collection Time: 03/31/19 12:45 AM   Specimen: BLOOD  Result Value Ref Range Status   Specimen Description BLOOD LEFT ARM  Final   Special Requests   Final    BOTTLES DRAWN AEROBIC ONLY Blood Culture adequate volume   Culture   Final    NO GROWTH 5 DAYS Performed at [facility], [address] [place], [place] [address]    Report Status 04/05/2019 FINAL  Final  SARS CORONAVIRUS 2 (TAT 6-24 HRS) Nasopharyngeal Nasopharyngeal Swab     Status: None   Collection Time: 03/31/19  2:26 AM   Specimen: Nasopharyngeal Swab  Result Value Ref Range Status   SARS Coronavirus 2 NEGATIVE NEGATIVE Final    Comment: (NOTE) SARS-CoV-2 target nucleic acids are NOT DETECTED. The SARS-CoV-2 RNA is generally detectable in upper and lower respiratory specimens during the acute phase of infection. Negative results do not preclude SARS-CoV-2 infection, do not rule out co-infections with other pathogens, and should not be used as the sole basis for treatment or other patient management decisions. Negative results must be combined with clinical observations, patient history, and epidemiological information. The expected result is Negative. Fact Sheet for Patients: SugarRoll.be Fact Sheet for Healthcare Providers: https://www.woods-mathews.com/ This test is not yet approved or cleared by the [place] FDA and  has been authorized for detection and/or diagnosis of SARS-CoV-2 by FDA under an Emergency  Use Authorization (EUA). This EUA will remain  in effect (meaning this test can be used) for the duration of the COVID-19 declaration under Section 56 4(b)(1) of the Act, 21 U.S.C. section 360bbb-3(b)(1), unless the authorization is terminated or revoked sooner. Performed at [facility], [place] [address] [place], [place] [address]   Urine culture     Status: Abnormal   Collection Time: 03/31/19  2:43 AM   Specimen: Urine, Random  Result Value Ref Range Status   Specimen Description URINE, RANDOM  Final   Special Requests NONE  Final   Culture (A)  Final    <10,000 COLONIES/mL INSIGNIFICANT GROWTH Performed at [facility], [place] [address], [place], [place] [address]    Report Status 04/01/2019 FINAL  Final  Surgical pcr screen     Status: None   Collection Time: 04/04/19 11:13 PM   Specimen: Nasal Mucosa; Nasal Swab  Result Value Ref Range Status   MRSA, PCR NEGATIVE NEGATIVE Final   Staphylococcus aureus NEGATIVE NEGATIVE Final    Comment: (NOTE) The Xpert SA Assay (FDA approved for NASAL specimens in patients 55 years of age and older), is one component of a comprehensive surveillance program. It is not intended to diagnose infection nor to guide or monitor treatment. Performed at [facility], [place] [address], [place], [place] [address]      RN Pressure Injury Documentation: Pressure Injury 03/01/19 Buttocks Medial  Stage II -  Partial thickness loss of dermis presenting as a shallow open ulcer with a red, pink wound bed without slough. (Active)  03/01/19 1732  Location: Buttocks  Location Orientation: Medial  Staging: Stage II -  Partial thickness loss of dermis presenting as a shallow open ulcer with a red, pink wound bed without slough.  Wound Description (Comments):   Present on Admission: Yes   Radiology Studies: No results found.  Scheduled Meds: . sodium chloride   Intravenous Once  . amiodarone  200 mg Oral Daily  . Chlorhexidine Gluconate  Cloth  6 each Topical Daily  . diltiazem  120 mg Oral Daily  . docusate sodium  100 mg Oral BID  . ferrous sulfate  300 mg Oral BID WC  . gabapentin  300 mg Oral TID  . metoprolol tartrate  50 mg Oral BID  . pantoprazole  40 mg Oral Daily  . pravastatin  20 mg Oral q1800  . senna-docusate  2 tablet Oral BID  . sodium chloride flush  10-40 mL Intracatheter Q12H  . sodium chloride flush  3 mL Intravenous Once   Continuous Infusions: . cefTRIAXone (ROCEPHIN)  IV 2 g (04/06/19 0516)  . magnesium sulfate bolus IVPB      LOS: 6 days    Kerney Elbe, DO [facility] PAGER is on [place]  If 7PM-7AM, please contact night-coverage [email or web address]

## 2019-04-06 NOTE — Progress Notes (Signed)
Patient arrived the unit from 42M on a hospital bed, assessment completed see flowsheet , placed on tele ccmd notified, patient oriented to room and staff, bed in lowest position, call bell within reach will continue to monitor.

## 2019-04-06 NOTE — Progress Notes (Signed)
NAME:  Robin Snow, MRN:  [id number], DOB:  03-22-1941, LOS: 6 ADMISSION DATE:  03/31/2019, CONSULTATION DATE:  04/05/19 REFERRING MD:  Sharol Given, CHIEF COMPLAINT:  Leg pain   Brief History   78 year old woman with hx of Afib, HTN, CKD, stroke p/w R AKA dehiscence brought to ICU for close monitoring after ABLA post procedure.  History of present illness   78 year old woman with hx of Afib, HTN, CKD, stroke p/w R AKA dehiscence brought to ICU for close monitoring after ABLA post procedure.  Complicated PMH.  History as below:  "Patient had R TKA in [place] in 2019.  Patient recently admitted from 10/30-12/23 for dislocation of R knee prosthesis with septic joint.  Closed reduction by Dr. Doreatha Martin 11/18. 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 Ultimately underwent R AKA by [name].  This complicated by sepsis from E.Coli bacteremia, spent several days in ICU on vent.  Also complicated by AKA wound dehissance. Patient finally discharged 12/[facility] to [facility] on rocephin to complete 6 weeks of therapy with end date of 1/6. For a full recounting of the prolonged, nearly 2 month hospital stay, please see Dr. Lisbeth Ply discharge summary on 12/23."  Patient still groggy from surgery, c/o right leg pain.  Past Medical History  CKD Anemia DM GIB HTN OSA on CPAP Pulmonary HTN Stroke  Past Surgical History:  Procedure Laterality Date  . AMPUTATION Right 03/01/2019   Procedure: AMPUTATION ABOVE KNEE;  Surgeon: [name], MD;  Location: [place];  Service: Orthopedics;  Laterality: Right;  . CESAREAN SECTION    . COLONOSCOPY Left 01/24/2013   Procedure: COLONOSCOPY;  Surgeon: Arta Silence, MD;  Location: [facility] ENDOSCOPY;  Service: Endoscopy;  Laterality: Left;  . ESOPHAGOGASTRODUODENOSCOPY Left 01/23/2013   Procedure: ESOPHAGOGASTRODUODENOSCOPY (EGD);  Surgeon: Arta Silence, MD;  Location: [facility] ENDOSCOPY;  Service: Endoscopy;  Laterality:  Left;  . EYE SURGERY Bilateral    cataract surgery  . HIP DISARTICULATION Right 04/05/2019   Procedure: HIP DISARTICULATION;  Surgeon: [name], MD;  Location: [place];  Service: Orthopedics;  Laterality: Right;  . JOINT REPLACEMENT    . KNEE CLOSED REDUCTION Right 02/22/2019   Procedure: CLOSED MANIPULATION KNEE;  Surgeon: Shona Needles, MD;  Location: [place];  Service: Orthopedics;  Laterality: Right;  . PACEMAKER IMPLANT [name] 09/09/2016   Procedure: Pacemaker Implant;  Surgeon: Evans Lance, MD;  Location: [place] [facility];  Service: Cardiovascular;  Laterality: [name];  . REPLACEMENT TOTAL KNEE BILATERAL    . [place] [facility] Events   03/31/19 admitted 04/05/19 hip disarticulation and revision  Consults:  Ortho TRH  Procedures:  12/30 right hip disarticulation, debridement of gluteus, iliopsoas muscles  Significant Diagnostic Tests:  H/H post op 7.5 from 9.5 pre-op  Micro Data:  Urine NG  Antimicrobials:  Ceftriaxone 12/24 >>   Interim history/subjective:  Stable overnight. 2 units pRBCs transfused last evening.  Objective   Blood pressure 117/64, pulse (!) 59, temperature (!) 97.2 F (36.2 C), temperature source Axillary, resp. rate 14, height '5\' 7"'$  (1.702 m), weight 87.5 kg, SpO2 100 %.        Intake/Output Summary (Last 24 hours) at 04/06/2019 1410 Last data filed at 04/06/2019 1329 Gross per 24 hour  Intake 1742.8 ml  Output 700 ml  Net 1042.8 ml   Filed Weights   04/01/19 1023 04/05/19 1705  Weight: 102  kg 87.5 kg    Examination: General: chronically ill appearing woman laying in bed comfortably HENT: [place]/AT, eyes anicteric Lungs: CTAB, breathing comfortably on RA Cardiovascular: RRR, no murmurs Abdomen: soft, NT, ND Extremities: R stump wound vac, no erythema Neuro: nodding to answer questions, speaking some, globally weak but moving all extremities Skin: no rashes or ecchymoses  Resolved Hospital Problem list     [name]  Assessment & Plan:  # ABLA- s/p transfusion 3 units pRBC # Afib- paced rhythm on tele # Sepsis due E coli bacteremia/ endocarditis- ceftriaxone course through 1/6. Will need to discuss with ortho if this should be prolonged for necrotic myositis. #septic arthritis and myositis # hyperbilirubinemia likely due to sepsis    -wound VAC and wound care per ortho recs -con't to monitor CBC daily -resume home Afib medications- amio, dilt, metoprolol -starting [place] heparin for DVT prophylaxis  -Given hemodynamic stability and stable organ function, PCCM will sign off. Please call with questions.   Labs   CBC: Recent Labs  Lab 04/02/19 0850 04/03/19 0312 04/04/19 0422 04/05/19 0448 04/05/19 1211 04/05/19 1504 04/05/19 2152 04/06/19 0525  WBC 6.8 6.6 6.3 9.7  --   --   --  13.4*  NEUTROABS 3.1 3.5 3.0 5.7  --   --   --  9.8*  HGB 8.7* 8.8* 9.2* 9.5* 7.1* 7.5* 10.6* 9.5*  HCT 28.1* 28.0* 29.0* 30.6* 21.0* 22.0* 31.7* 28.0*  MCV 96.6 95.6 95.1 95.3  --   --   --  91.8  PLT 258 248 251 221  --   --   --  355    Basic Metabolic Panel: Recent Labs  Lab 04/02/19 0850 04/03/19 0312 04/03/19 0922 04/04/19 0422 04/05/19 0448 04/05/19 1211 04/05/19 1504 04/06/19 0525  NA 140 138  --  139 139 139 141 140  K 3.4* 3.4*  --  3.9 3.7 4.2 4.1 4.0  CL 103 103  --  103 103  --   --  106  CO2 30 27  --  27 26  --   --  27  GLUCOSE 84 91  --  92 108*  --   --  138*  BUN 12 12  --  12 13  --   --  15  CREATININE 0.96 0.93  --  0.86 0.90  --   --  0.83  CALCIUM 8.2* 8.0*  --  8.3* 8.4*  --   --  8.6*  MG  --   --  1.7  --   --   --   --  1.5*  PHOS  --   --   --   --   --   --   --  3.5   GFR: Estimated Creatinine Clearance: 62.5 mL/min (by C-G formula based on SCr of 0.83 mg/dL). Recent Labs  Lab 03/31/19 0044 04/03/19 0312 04/04/19 0422 04/05/19 0448 04/06/19 0525  WBC 8.6 6.6 6.3 9.7 13.4*  LATICACIDVEN 1.3  --   --   --   --     Liver Function Tests: Recent Labs  Lab  03/31/19 0044 04/06/19 0525  AST 27 13*  ALT 22 12  ALKPHOS 118 81  BILITOT 1.3* 2.2*  PROT 5.9* 4.8*  ALBUMIN 2.1* 2.2*   No results for input(s): LIPASE, AMYLASE in the last 168 hours. No results for input(s): AMMONIA in the last 168 hours.  ABG    Component Value Date/Time   PHART 7.370 03/02/2019 1202  PCO2ART 37.2 03/02/2019 1202   PO2ART 149.0 (H) 03/02/2019 1202   HCO3 29.2 (H) 04/05/2019 1504   TCO2 31 04/05/2019 1504   ACIDBASEDEF 3.0 (H) 03/02/2019 1202   O2SAT 73.0 04/05/2019 1504     Coagulation Profile: Recent Labs  Lab 03/31/19 0044  INR 1.2    Cardiac Enzymes: No results for input(s): CKTOTAL, CKMB, CKMBINDEX, TROPONINI in the last 168 hours.  HbA1C: Hgb A1c MFr Bld  Date/Time Value Ref Range Status  02/03/2019 02:09 AM 5.8 (H) 4.8 - 5.6 % Final    Comment:    REPEATED TO VERIFY (NOTE) Pre diabetes:          5.7%-6.4% Diabetes:              >6.4% Glycemic control for   <7.0% adults with diabetes   04/13/2016 10:18 AM 6.3 (H) 4.8 - 5.6 % Final    Comment:    (NOTE)         Pre-diabetes: 5.7 - 6.4         Diabetes: >6.4         Glycemic control for adults with diabetes: <7.0     CBG: Recent Labs  Lab 04/05/19 1924 04/05/19 2311 04/06/19 0308 04/06/19 0727 04/06/19 1134  GLUCAP 146* 141* 136* 122* 128*    Review of Systems:   Too groggy to answer  Past Medical History  She,  has a past medical history of AKI (acute kidney injury) ([place]) (04/2016), Anemia, Arthritis, Chronic atrial fibrillation ([place]), Chronic edema, Chronic venous insufficiency, CKD (chronic kidney disease), stage III, Coagulopathy ([place]), Diabetes mellitus, Diverticulosis, Dyslipidemia, GERD (gastroesophageal reflux disease), GI bleed (2015), transfusion of packed red blood cells, Hypertension, Morbid obesity ([place]), Obstructive sleep apnea, Pulmonary hypertension ([place]), Pyelonephritis (04/2016), Renal infarct [facility]), Sinus bradycardia, and Stroke ([place]) (2015).    Surgical History    Past Surgical History:  Procedure Laterality Date  . AMPUTATION Right 03/01/2019   Procedure: AMPUTATION ABOVE KNEE;  Surgeon: [name], MD;  Location: [place];  Service: Orthopedics;  Laterality: Right;  . CESAREAN SECTION    . COLONOSCOPY Left 01/24/2013   Procedure: COLONOSCOPY;  Surgeon: Arta Silence, MD;  Location: [facility] ENDOSCOPY;  Service: Endoscopy;  Laterality: Left;  . ESOPHAGOGASTRODUODENOSCOPY Left 01/23/2013   Procedure: ESOPHAGOGASTRODUODENOSCOPY (EGD);  Surgeon: Arta Silence, MD;  Location: [facility] ENDOSCOPY;  Service: Endoscopy;  Laterality: Left;  . EYE SURGERY Bilateral    cataract surgery  . HIP DISARTICULATION Right 04/05/2019   Procedure: HIP DISARTICULATION;  Surgeon: [name], MD;  Location: [place];  Service: Orthopedics;  Laterality: Right;  . JOINT REPLACEMENT    . KNEE CLOSED REDUCTION Right 02/22/2019   Procedure: CLOSED MANIPULATION KNEE;  Surgeon: Shona Needles, MD;  Location: [place];  Service: Orthopedics;  Laterality: Right;  . PACEMAKER IMPLANT [name] 09/09/2016   Procedure: Pacemaker Implant;  Surgeon: Evans Lance, MD;  Location: [place] [facility] LAB;  Service: Cardiovascular;  Laterality: [name];  . REPLACEMENT TOTAL KNEE BILATERAL    . TUBAL LIGATION       Social History   reports that she quit smoking about 25 years ago. Her smoking use included cigarettes. She has a 4.08 pack-year smoking history. She has never used smokeless tobacco. She reports that she does not drink alcohol or use drugs.   Family History   Her family history includes Cancer in her father; Stroke in her maternal aunt.   Allergies Allergies  Allergen Reactions  . Aspirin Hives and  Swelling    Angioedema   . Rofecoxib Hives  . Hydrocodone Hives    Tolerates oxycodone and tramadol     Home Medications  Prior to Admission medications   Medication Sig Start Date End Date Taking? Authorizing Provider  amiodarone (PACERONE) 200 MG tablet Take 1  tablet (200 mg total) by mouth daily. 03/30/19  Yes Oretha Milch D, MD  cefTRIAXone (ROCEPHIN) IVPB Inject 2 g into the vein daily. Indication:  E Coli Endocarditis Last Day of Therapy:  04/12/2019 Labs - Once weekly:  CBC/D and BMP, Labs - Every other week:  ESR and CRP 03/29/19 04/27/19 Yes Oretha Milch D, MD  diltiazem (CARDIZEM CD) 120 MG 24 hr capsule Take 1 capsule (120 mg total) by mouth daily. 03/29/19  Yes Oretha Milch D, MD  ferrous sulfate 300 (60 Fe) MG/5ML syrup Take 5 mLs (300 mg total) by mouth 2 (two) times daily with a meal. 03/29/19  Yes Oretha Milch D, MD  gabapentin (NEURONTIN) 300 MG capsule Take 1 capsule (300 mg total) by mouth 3 (three) times daily. Need appointment before next refill 03/29/19  Yes Oretha Milch D, MD  lovastatin (MEVACOR) 20 MG tablet Take 1 tablet (20 mg total) by mouth at bedtime. Need appointment for further refills 03/29/19  Yes Oretha Milch D, MD  metoprolol tartrate (LOPRESSOR) 50 MG tablet Take 1 tablet (50 mg total) by mouth 2 (two) times daily. 03/29/19  Yes Oretha Milch D, MD  pantoprazole (PROTONIX) 40 MG tablet Take 1 tablet (40 mg total) by mouth daily. 03/30/19  Yes Oretha Milch D, MD  senna-docusate (SENOKOT-S) 8.6-50 MG tablet Take 2 tablets by mouth 2 (two) times daily. 03/29/19  Yes Oretha Milch D, MD  traMADol (ULTRAM) 50 MG tablet Take 1-2 tablets (50-100 mg total) by mouth every 6 (six) hours as needed for moderate pain. 03/29/19  Yes Oretha Milch D, MD  Vitamin D, Ergocalciferol, (DRISDOL) 1.25 MG (50000 UT) CAPS capsule Take 50,000 Units by mouth every 7 (seven) days.   Yes [provider]  warfarin (COUMADIN) 5 MG tablet HOLD AND DO NOT TAKE UNTIL WOUND EVALUATED BY DR Kerr  TAKE 1 TABLET BY MOUTH DAILY EXCEPT 1 & 1/2 TABLETS ON WEDNESDAY OR AS DIRECTED BY ANTICOAGULATION CLINIC Patient not taking: Reported on 04/01/2019 03/29/19   Desiree Hane, MD       Julian Hy, DO 04/06/19 2:46 PM [facility]  Pulmonary & Critical Care

## 2019-04-07 LAB — CBC WITH DIFFERENTIAL/PLATELET
Abs Immature Granulocytes: 0.87 10*3/uL — ABNORMAL HIGH (ref 0.00–0.07)
Basophils Absolute: 0.1 10*3/uL (ref 0.0–0.1)
Basophils Relative: 0 %
Eosinophils Absolute: 0 10*3/uL (ref 0.0–0.5)
Eosinophils Relative: 0 %
HCT: 25.5 % — ABNORMAL LOW (ref 36.0–46.0)
Hemoglobin: 8.3 g/dL — ABNORMAL LOW (ref 12.0–15.0)
Immature Granulocytes: 6 %
Lymphocytes Relative: 15 %
Lymphs Abs: 2.3 10*3/uL (ref 0.7–4.0)
MCH: 31 pg (ref 26.0–34.0)
MCHC: 32.5 g/dL (ref 30.0–36.0)
MCV: 95.1 fL (ref 80.0–100.0)
Monocytes Absolute: 1.3 10*3/uL — ABNORMAL HIGH (ref 0.1–1.0)
Monocytes Relative: 9 %
Neutro Abs: 10.8 10*3/uL — ABNORMAL HIGH (ref 1.7–7.7)
Neutrophils Relative %: 70 %
Platelets: 161 10*3/uL (ref 150–400)
RBC: 2.68 MIL/uL — ABNORMAL LOW (ref 3.87–5.11)
RDW: 15.4 % (ref 11.5–15.5)
WBC: 15.3 10*3/uL — ABNORMAL HIGH (ref 4.0–10.5)
nRBC: 0.1 % (ref 0.0–0.2)

## 2019-04-07 LAB — COMPREHENSIVE METABOLIC PANEL
ALT: 9 U/L (ref 0–44)
AST: 14 U/L — ABNORMAL LOW (ref 15–41)
Albumin: 1.9 g/dL — ABNORMAL LOW (ref 3.5–5.0)
Alkaline Phosphatase: 83 U/L (ref 38–126)
Anion gap: 7 (ref 5–15)
BUN: 16 mg/dL (ref 8–23)
CO2: 27 mmol/L (ref 22–32)
Calcium: 8.3 mg/dL — ABNORMAL LOW (ref 8.9–10.3)
Chloride: 105 mmol/L (ref 98–111)
Creatinine, Ser: 0.85 mg/dL (ref 0.44–1.00)
GFR calc Af Amer: >60 mL/min (ref >60)
GFR calc non Af Amer: >60 mL/min (ref >60)
Glucose, Bld: 99 mg/dL (ref 70–99)
Potassium: 3.8 mmol/L (ref 3.5–5.1)
Sodium: 139 mmol/L (ref 135–145)
Total Bilirubin: 0.9 mg/dL (ref 0.3–1.2)
Total Protein: 4.8 g/dL — ABNORMAL LOW (ref 6.5–8.1)

## 2019-04-07 LAB — PHOSPHORUS: Phosphorus: 2.9 mg/dL (ref 2.5–4.6)

## 2019-04-07 LAB — MAGNESIUM: Magnesium: 2.2 mg/dL (ref 1.7–2.4)

## 2019-04-07 NOTE — Progress Notes (Addendum)
PROGRESS NOTE    IKHLAS ALBO  OBS:[id number] DOB: 05-Sep-1940 DOA: 03/31/2019 PCP: [name], MD   Brief Narrative:  Yoko P Howellis a 79 y.o.femalewith medical history significant ofa.fib, HTN, CKD, stroke. Patient had R TKA in [place] in 2019. Patient recently admitted from 10/30-12/23 for dislocation of R knee prosthesis with septic joint. Closed reduction by Dr. Doreatha Martin 11/18. 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 Ultimately underwent R AKA by [name]. This complicated by sepsis from E.Coli bacteremia, spent several days in ICU on vent. Also complicated by AKA wound dehissance.Patient finally discharged 12/23 to [facility] on rocephin to complete 6 weeks of therapy with end date of 1/6.   For a full recounting of the prolonged, nearly 2 month hospital stay, please see Dr. Lisbeth Ply discharge summary on 12/23.  Per SNF: patient didn't get rocephin on (12/24). At around 7pm they noticed asmall amount ofbloodydrainage from dressingand noticedfoulodor from wound. At 745pm noticed dressing was saturated and at 9pm now oozing blood around dressing. Patient was noted to be running fever of 100.7 at facility. Facility confirms that coumadin is still on hold as per DC summary plan.  ED Course:No fever in ED, lab work actually looks fairly stable compared to 2 days ago.  CT of RLE femur is worrisome for hematoma with probable superimposed infection extending from amputation site.  She was admitted under hospital service with Ortho to consult.  She was given Rocephin and vancomycin in ED.  She is scheduled to have right hip disarticulation with [name].  She underwent hip disarticulation in the OR on 04/05/19 and postoperatively she had some hemorrhagic shock and became hypotensive received 3 units of albumin Neo-Synephrine as well as 3 units of PRBCs and was transferred to the intensive care unit for further  monitoring.  She is now hemodynamically stable and improved and has been transferred to PCU.  He is much more awake and alert and does have some anemia which we will continue to monitor.  PT OT recommending skilled nursing facility.  Assessment & Plan:   Principal Problem:   Wound dehiscence Active Problems:   Essential hypertension   AF (paroxysmal atrial fibrillation) (HCC)   Bacteremia due to Escherichia coli   S/P AKA (above knee amputation), right (HCC)   Cellulitis of right lower extremity   Dehiscence of amputation stump (HCC)   Wound dehiscence/infection of amputation site/right stump status post hip disarticulation postoperative day 2 -Wound care on board.  Per Dr. Jess Barters consultation note, patient is scheduled to have right hip disarticulation and this was done yesterday and postoperatively she was transferred to the ICU for post hemorrhagic shock this is now improved and will be transferred to the progressive care unit -Has a wound VAC in place -Currently getting 2 units of PRBCs and will need a third one per note; she is status post 3 units of PRBCs now -PCCM has been consulted given her hemorrhagic shock but she is hemodynamically stable -Further care per orthopedic surgery -Currently on IV Dilaudid 0.5 mg every 4 as needed for severe pain, p.o. oxycodone IR and oxycodone acetaminophen 1 tab p.o. twice daily as needed Bowel regimen in place -PCCM was consulted for hemorrhagic shock but she is stable and she is being transferred to the stepdown unit now -Per orthopedic surgery the patient had extensive necrotic muscles including muscles originating in the pelvis and tissue were sent for cultures and patient will need long-term antibiotics  and she is currently on Rocephin 2 g IV daily and may need further adjustments based on her cultures and sensitivities; surgical pathology results showed a gangrenous necrosis of skin and soft tissue with atheroma formation with a luminal  stenosis and dystrophic calcifications of vasculature and it stated that the gangrenous necrosis is extensive soft tissue resection margin; I do not see the sensitivities in EPIC -WBC went from 9.7 -> 13.4 -> 15.3  Acute Blood Loss Anemia from Surgical Intervention -Patient's hemoglobin/hematocrit went from 9.5/30.6 and dropped down to 7.1/21.01 and was transfused 3 units of PRBCs -BP after transfusion was 10.6/31.7 and now started dropping again and went to 9.5/28.0 -> 8.3/25.5 this AM  -Continue to monitor for signs and symptoms of bleeding at time of expected postoperative drop given the complexity of her surgery -Continue with iron supplementation ferrous sulfate 300 mg/5 mL p.o. twice daily -Continue to monitor for signs and symptoms of bleeding; currently no overt bleeding noted -Has a wound VAC in place and had approximately 800 cc of drainage from surgery -Coumadin still on Hold and Blood Count has been dropping for now   E. coli Bacteremia/Endocarditis:  -Continue Rocephin 2 g IV daily until 04/11/2018 per ID recommendations from recent hospitalization.  Permanent Atrial Fibrillation was in sinus tachycardia this morning -Rates fluctuating but mostly controlled without symptoms and they were ranging in the 120s this morning.   Resume home dose of metoprolol, Cardizem and amiodarone  -Coumadin on hold due to hematoma at this time. Will resume anticoagulation once cleared by orthopedics.   Essential Hypertension: -Controlled. C/w Amiodarone,  -Initially held diltiazem and metoprolol she becomes hypotensive but resumed today -Went into post hemorrhagic shock and had to be placed on Neo-Synephrine in the OR and given albumin and 3 units of PRBCs -Pressures have improved and was 113/58 -Resume antihypertensives  Obesity Severe malnutrition in the context of acute illness/injury -Estimated body mass index is 30.21 kg/m as calculated from the following:   Height as of this encounter:  '5\' 7"'$  (1.702 m).   Weight as of this encounter: 87.5 kg. -Weight Loss and Dietary Counseling given  -Patient is consulted for further evaluation recommendations and recommending Ensure Enlive p.o. 3 times daily along with Magic cup 3 times daily with meals and multivitamin with minerals daily as well as liberalizing diet to help improve p.o. intake  Hyperlipidemia -Continue Pravastatin 20 g p.o. nightly  GERD  -Continue Pantoprazole 40 mg p.o. daily  Hypomagnesemia -Patient's Mag Level was 1.5 and repeat this AM was 2.2 -Replete with IV Mag Sulfate 2 grams yesterday  -Continue to Monitor and Replete as Necessary -Repeat Mag Level in the AM  Hyperbilirubinemia -Patient's T Bili was 2.2 and improved to 0.9 -Likely Reactive -Continue to Monitor and Trend -Repeat CMP in AM   Leukocytosis -Likely reactive in the setting of Surgical intervention and Hip Disarticulation -Patients WBC went from  9.7 -> 13.4 -> 15.3 -Continue to Monitor for S/Sx of Infection -Repeat CBC in AM   DVT prophylaxis: SCDs; Now on Heparin 5,000 units sq q8h. Resume Coumadin when okay with orthopedic surgery Code Status: FULL CODE  Family Communication: No family present at bedside  Disposition Plan: She was transferred to the ICU for closer monitoring yesterday given her hypotension in the OR but she is hemodynamically stable and will be transferred to the progressive care unit today  Consultants:  Orthopedic Surgery  PCCM/Pulmonary   Procedures:  HIP DISARTICULATION done by [name]    Antimicrobials:  Anti-infectives (From admission, onward)   Start     Dose/Rate Route Frequency Ordered Stop   04/05/19 1400  ceFAZolin (ANCEF) IVPB 2g/100 mL premix     2 g 200 mL/hr over 30 Minutes Intravenous On call to O.R. 04/05/19 0723 04/05/19 1100   04/01/19 0500  cefTRIAXone (ROCEPHIN) IVPB  Status:  Discontinued    Note to Pharmacy: Indication:  E Coli Endocarditis Last Day of Therapy:  04/12/2019 Labs -  Once weekly:  CBC/D and BMP, Labs - Every other week:  ESR and CRP     2 g Intravenous Every 24 hours 03/31/19 0436 03/31/19 0500   04/01/19 0400  cefTRIAXone (ROCEPHIN) 2 g in sodium chloride 0.9 % 100 mL IVPB     2 g 200 mL/hr over 30 Minutes Intravenous Every 24 hours 03/31/19 0501 04/13/19 0359   03/31/19 0400  vancomycin (VANCOCIN) IVPB 1000 mg/200 mL premix     1,000 mg 200 mL/hr over 60 Minutes Intravenous  Once 03/31/19 0356 03/31/19 0606   03/31/19 0100  cefTRIAXone (ROCEPHIN) 2 g in sodium chloride 0.9 % 100 mL IVPB     2 g 200 mL/hr over 30 Minutes Intravenous  Once 03/31/19 0100 03/31/19 0329     Subjective: Seen and examined at bedside and she was complaining of some pain in her right side and states it was hurting "in her leg".  No nausea or vomiting.  That she slept okay.  No other concerns or complaints at this time and is more awake today than she was last 3 days that have seen her.  Objective: Vitals:   04/06/19 1730 04/06/19 2010 04/07/19 0104 04/07/19 0339  BP: 129/63 126/62 (!) 114/55 122/62  Pulse: 60 60 60 (!) 59  Resp: '15 12 12 12  '$ Temp: 98.2 F (36.8 C) (!) 97.5 F (36.4 C) 97.7 F (36.5 C) 98.1 F (36.7 C)  TempSrc: Oral Oral Axillary Oral  SpO2: 100% 100% 99% 100%  Weight:      Height:        Intake/Output Summary (Last 24 hours) at 04/07/2019 0802 Last data filed at 04/07/2019 0524 Gross per 24 hour  Intake 746.38 ml  Output 575 ml  Net 171.38 ml   Filed Weights   04/01/19 1023 04/05/19 1705  Weight: 102 kg 87.5 kg   Examination: Physical Exam:  Constitutional: WN/WD obese African-American female currently in NAD and appears calm does appear a little uncomfortable Eyes: Had a little bit of cataracts ENMT: External Ears, Nose appear normal. Grossly normal hearing. Mucous membranes are moist.  Neck: Appears normal, supple, no cervical masses, normal ROM, no appreciable thyromegaly; no JVD Respiratory: Slightly diminished to auscultation  bilaterally with coarse breath sounds, no wheezing, rales, rhonchi or crackles. Normal respiratory effort and patient is not tachypenic. No accessory muscle use.   Cardiovascular: Paced rhythm and is normal rate around 60s, no murmurs / rubs / gallops. S1 and S2 auscultated.  1+ lower extremity edema in the left leg Abdomen: Soft, tender to palpate, distended secondary body habitus.  Bowel sounds has a wound VAC in place on the right side of her abdomen  GU: Deferred. Musculoskeletal: Has a right hip that has been disarticulated and had wound VAC in place Skin: No rashes, lesions, ulcers on limited skin evaluation. No induration; Warm and dry.  Neurologic: CN 2-12 grossly intact with no focal deficits. Romberg sign and cerebellar reflexes not assessed.  Psychiatric: Normal judgment and insight. Alert and oriented x 2. Normal mood  and appropriate affect.   Data Reviewed: I have personally reviewed following labs and imaging studies  CBC: Recent Labs  Lab 04/03/19 0312 04/04/19 0422 04/05/19 0448 04/05/19 1211 04/05/19 1504 04/05/19 2152 04/06/19 0525 04/07/19 0305  WBC 6.6 6.3 9.7  --   --   --  13.4* 15.3*  NEUTROABS 3.5 3.0 5.7  --   --   --  9.8* 10.8*  HGB 8.8* 9.2* 9.5* 7.1* 7.5* 10.6* 9.5* 8.3*  HCT 28.0* 29.0* 30.6* 21.0* 22.0* 31.7* 28.0* 25.5*  MCV 95.6 95.1 95.3  --   --   --  91.8 95.1  PLT 248 251 221  --   --   --  173 297   Basic Metabolic Panel: Recent Labs  Lab 04/03/19 0312 04/03/19 0922 04/04/19 0422 04/05/19 0448 04/05/19 1211 04/05/19 1504 04/06/19 0525 04/07/19 0305  NA 138  --  139 139 139 141 140 139  K 3.4*  --  3.9 3.7 4.2 4.1 4.0 3.8  CL 103  --  103 103  --   --  106 105  CO2 27  --  27 26  --   --  27 27  GLUCOSE 91  --  92 108*  --   --  138* 99  BUN 12  --  12 13  --   --  15 16  CREATININE 0.93  --  0.86 0.90  --   --  0.83 0.85  CALCIUM 8.0*  --  8.3* 8.4*  --   --  8.6* 8.3*  MG  --  1.7  --   --   --   --  1.5* 2.2  PHOS  --   --   --    --   --   --  3.5 2.9   GFR: Estimated Creatinine Clearance: 61 mL/min (by C-G formula based on SCr of 0.85 mg/dL). Liver Function Tests: Recent Labs  Lab 04/06/19 0525 04/07/19 0305  AST 13* 14*  ALT 12 9  ALKPHOS 81 83  BILITOT 2.2* 0.9  PROT 4.8* 4.8*  ALBUMIN 2.2* 1.9*   No results for input(s): LIPASE, AMYLASE in the last 168 hours. No results for input(s): AMMONIA in the last 168 hours. Coagulation Profile: No results for input(s): INR, PROTIME in the last 168 hours. Cardiac Enzymes: No results for input(s): CKTOTAL, CKMB, CKMBINDEX, TROPONINI in the last 168 hours. BNP (last 3 results) No results for input(s): PROBNP in the last 8760 hours. HbA1C: No results for input(s): HGBA1C in the last 72 hours. CBG: Recent Labs  Lab 04/05/19 2311 04/06/19 0308 04/06/19 0727 04/06/19 1134 04/06/19 1506  GLUCAP 141* 136* 122* 128* 149*   Lipid Profile: No results for input(s): CHOL, HDL, LDLCALC, TRIG, CHOLHDL, LDLDIRECT in the last 72 hours. Thyroid Function Tests: No results for input(s): TSH, T4TOTAL, FREET4, T3FREE, THYROIDAB in the last 72 hours. Anemia Panel: No results for input(s): VITAMINB12, FOLATE, FERRITIN, TIBC, IRON, RETICCTPCT in the last 72 hours. Sepsis Labs: No results for input(s): PROCALCITON, LATICACIDVEN in the last 168 hours.  Recent Results (from the past 240 hour(s))  SARS CORONAVIRUS 2 (TAT 6-24 HRS) Nasopharyngeal Nasopharyngeal Swab     Status: None   Collection Time: 03/28/19  3:00 PM   Specimen: Nasopharyngeal Swab  Result Value Ref Range Status   SARS Coronavirus 2 NEGATIVE NEGATIVE Final    Comment: (NOTE) SARS-CoV-2 target nucleic acids are NOT DETECTED. The SARS-CoV-2 RNA is generally detectable in upper and lower respiratory  specimens during the acute phase of infection. Negative results do not preclude SARS-CoV-2 infection, do not rule out co-infections with other pathogens, and should not be used as the sole basis for treatment  or other patient management decisions. Negative results must be combined with clinical observations, patient history, and epidemiological information. The expected result is Negative. Fact Sheet for Patients: SugarRoll.be Fact Sheet for Healthcare Providers: https://www.woods-mathews.com/ This test is not yet approved or cleared by the [place] FDA and  has been authorized for detection and/or diagnosis of SARS-CoV-2 by FDA under an Emergency Use Authorization (EUA). This EUA will remain  in effect (meaning this test can be used) for the duration of the COVID-19 declaration under Section 56 4(b)(1) of the Act, 21 U.S.C. section 360bbb-3(b)(1), unless the authorization is terminated or revoked sooner. Performed at [facility], [place] [address], [place], [place] [address]   Blood culture (routine x 2)     Status: None   Collection Time: 03/31/19 12:44 AM   Specimen: BLOOD LEFT HAND  Result Value Ref Range Status   Specimen Description BLOOD LEFT HAND  Final   Special Requests   Final    BOTTLES DRAWN AEROBIC ONLY Blood Culture results may not be optimal due to an inadequate volume of blood received in culture bottles   Culture   Final    NO GROWTH 5 DAYS Performed at [facility], [place] [address], [place], [place] [address]    Report Status 04/05/2019 FINAL  Final  Blood culture (routine x 2)     Status: None   Collection Time: 03/31/19 12:45 AM   Specimen: BLOOD  Result Value Ref Range Status   Specimen Description BLOOD LEFT ARM  Final   Special Requests   Final    BOTTLES DRAWN AEROBIC ONLY Blood Culture adequate volume   Culture   Final    NO GROWTH 5 DAYS Performed at [facility], [address] [place], [place] [address]    Report Status 04/05/2019 FINAL  Final  SARS CORONAVIRUS 2 (TAT 6-24 HRS) Nasopharyngeal Nasopharyngeal Swab     Status: None   Collection Time: 03/31/19  2:26 AM   Specimen:  Nasopharyngeal Swab  Result Value Ref Range Status   SARS Coronavirus 2 NEGATIVE NEGATIVE Final    Comment: (NOTE) SARS-CoV-2 target nucleic acids are NOT DETECTED. The SARS-CoV-2 RNA is generally detectable in upper and lower respiratory specimens during the acute phase of infection. Negative results do not preclude SARS-CoV-2 infection, do not rule out co-infections with other pathogens, and should not be used as the sole basis for treatment or other patient management decisions. Negative results must be combined with clinical observations, patient history, and epidemiological information. The expected result is Negative. Fact Sheet for Patients: SugarRoll.be Fact Sheet for Healthcare Providers: https://www.woods-mathews.com/ This test is not yet approved or cleared by the [place] FDA and  has been authorized for detection and/or diagnosis of SARS-CoV-2 by FDA under an Emergency Use Authorization (EUA). This EUA will remain  in effect (meaning this test can be used) for the duration of the COVID-19 declaration under Section 56 4(b)(1) of the Act, 21 U.S.C. section 360bbb-3(b)(1), unless the authorization is terminated or revoked sooner. Performed at [facility], [place] [address], [place], [place] [address]   Urine culture     Status: Abnormal   Collection Time: 03/31/19  2:43 AM   Specimen: Urine, Random  Result Value Ref Range Status  Specimen Description URINE, RANDOM  Final   Special Requests NONE  Final   Culture (A)  Final    <10,000 COLONIES/mL INSIGNIFICANT GROWTH Performed at [facility], [place] [address] [place], [place] [address]    Report Status 04/01/2019 FINAL  Final  Surgical pcr screen     Status: None   Collection Time: 04/04/19 11:13 PM   Specimen: Nasal Mucosa; Nasal Swab  Result Value Ref Range Status   MRSA, PCR NEGATIVE NEGATIVE Final   Staphylococcus aureus NEGATIVE NEGATIVE Final    Comment:  (NOTE) The Xpert SA Assay (FDA approved for NASAL specimens in patients 66 years of age and older), is one component of a comprehensive surveillance program. It is not intended to diagnose infection nor to guide or monitor treatment. Performed at [facility], [place] [address] [place], [place] [address]     RN Pressure Injury Documentation: Pressure Injury 03/01/19 Buttocks Medial Stage II -  Partial thickness loss of dermis presenting as a shallow open ulcer with a red, pink wound bed without slough. (Active)  03/01/19 1732  Location: Buttocks  Location Orientation: Medial  Staging: Stage II -  Partial thickness loss of dermis presenting as a shallow open ulcer with a red, pink wound bed without slough.  Wound Description (Comments):   Present on Admission: Yes   Radiology Studies: No results found.  Scheduled Meds:  sodium chloride   Intravenous Once   amiodarone  200 mg Oral Daily   Chlorhexidine Gluconate Cloth  6 each Topical Daily   diltiazem  120 mg Oral Daily   docusate  100 mg Oral BID   feeding supplement (ENSURE ENLIVE)  237 mL Oral TID BM   ferrous sulfate  300 mg Oral BID WC   gabapentin  300 mg Oral Q12H   heparin injection (subcutaneous)  5,000 Units Subcutaneous Q8H   metoprolol tartrate  50 mg Oral BID   multivitamin with minerals  1 tablet Oral Daily   pantoprazole (PROTONIX) IV  40 mg Intravenous Q24H   pravastatin  20 mg Oral q1800   senna-docusate  2 tablet Oral BID   sodium chloride flush  10-40 mL Intracatheter Q12H   sodium chloride flush  3 mL Intravenous Once   Continuous Infusions:  cefTRIAXone (ROCEPHIN)  IV 2 g (04/07/19 0328)    LOS: 7 days    Kerney Elbe, DO [facility] PAGER is on AMION  If 7PM-7AM, please contact night-coverage [email or web address]

## 2019-04-07 NOTE — Evaluation (Addendum)
Occupational Therapy Evaluation Patient Details Name: Robin Snow MRN: [id number] DOB: December 29, 1940 Today's Date: 04/07/2019    History of Present Illness PT adm with wound dehiscence/infection of rt AKA. Underwent rt hip disarticulation on 12/30. PMH - Hospitalized from 10/29-11/4 with sepsis related to E. coli bacteremia, UTI, and bacterial arthritis right knee. Found to have dislocated right knee prothestic--02/22/19 s/p Closed reduction of right knee prosthetic dislocation. 11/25 Rt AKA. Afib, CKD, HTN, DM   Clinical Impression   Patient is s/p R disarticulation 12/30 surgery resulting in functional limitations due to the deficits listed below (see OT problem list). Pt currently total (A) for all adls and unable to move with one person (A) in the bed. Pt disoriented and unaware of surgery on 12/30. Session limited by pain and fatigue. Pt required name call to redirect to session. Patient will benefit from skilled OT acutely to increase independence and safety with ADLS to allow discharge SNF.     Follow Up Recommendations  SNF;Supervision/Assistance - 24 hour    Equipment Recommendations  Wheelchair (measurements OT);Wheelchair cushion (measurements OT);Hospital bed;Tub/shower bench;3 in 1 bedside commode    Recommendations for Other Services       Precautions / Restrictions Precautions Precautions: Fall Precaution Comments: wound vac R hip disarticulation Restrictions Weight Bearing Restrictions: Yes RLE Weight Bearing: Non weight bearing      Mobility Bed Mobility Overal bed mobility: Needs Assistance Bed Mobility: Rolling           General bed mobility comments: unable to one person (A) patient. Session focused on BIL UE movement and L UE movement due to edema  Transfers                 General transfer comment: unable    Balance                                           ADL either performed or assessed with clinical judgement    ADL                                         General ADL Comments: total for all adls     Vision Baseline Vision/History: Wears glasses Wears Glasses: Reading only Vision Assessment?: No apparent visual deficits     Perception     Praxis      Pertinent Vitals/Pain Pain Assessment: Faces Faces Pain Scale: Hurts worst Pain Location: R surgical site Pain Descriptors / Indicators: Grimacing;Guarding Pain Intervention(s): Monitored during session     Hand Dominance Right   Extremity/Trunk Assessment Upper Extremity Assessment Upper Extremity Assessment: LUE deficits/detail LUE Deficits / Details: edema noted throughout arm. pt with pain with digit flexion. pt appears to have bruising on hand from previous IV placement.    Lower Extremity Assessment Lower Extremity Assessment: RLE deficits/detail RLE Deficits / Details: hip disarticulation   Cervical / Trunk Assessment Cervical / Trunk Assessment: Kyphotic Cervical / Trunk Exceptions: forward head rounded shoulder posture   Communication Communication Communication: No difficulties   Cognition Arousal/Alertness: Awake/alert Behavior During Therapy: Flat affect Overall Cognitive Status: Difficult to assess  General Comments: pt very polite and answering questions correctly. pt then states "oh mamam -- when is my surgery?" Pt had surgery 2 days ago and unaware   General Comments  lethargic but answering    Exercises Exercises: Other exercises Other Exercises Other Exercises: AROM of R UE hand wrist elbow shoulder in all planes without deficits Other Exercises: AAROM L UE with decrease gross grasp, not following commands for wrist extension, AAROM elbow flexion extension and shoulder flexion less 90 degrees with edema noted x15 reps   Shoulder Instructions      Home Living Family/patient expects to be discharged to:: Skilled nursing facility    Available Help at Discharge: (daughter nadeline, son and grandson "chris")                                    Prior Functioning/Environment Level of Independence: Needs assistance    ADL's / Homemaking Assistance Needed: tota            OT Problem List: Decreased strength;Impaired balance (sitting and/or standing);Decreased activity tolerance;Decreased safety awareness;Obesity;Pain;Increased edema;Decreased knowledge of use of DME or AE;Decreased knowledge of precautions      OT Treatment/Interventions: Self-care/ADL training;Therapeutic exercise;Neuromuscular education;Energy conservation;DME and/or AE instruction;Manual therapy;Modalities;Therapeutic activities;Patient/family education;Balance training    OT Goals(Current goals can be found in the care plan section) Acute Rehab OT Goals Patient Stated Goal: not stated OT Goal Formulation: Patient unable to participate in goal setting Time For Goal Achievement: 04/21/19 Potential to Achieve Goals: Fair ADL Goals Pt Will Perform Eating: with min assist;bed level Pt Will Perform Grooming: with min assist;sitting Pt/caregiver will Perform Home Exercise Program: Increased strength;Increased ROM;Both right and left upper extremity;With minimal assist;With written HEP provided Additional ADL Goal #1: Pt will tolerate EOB for 5 minutes with mod (A) as precursor to adls  OT Frequency: Min 2X/week   Barriers to D/C:            Co-evaluation              AM-PAC OT "6 Clicks" Daily Activity     Outcome Measure Help from another person eating meals?: A Lot Help from another person taking care of personal grooming?: Total Help from another person toileting, which includes using toliet, bedpan, or urinal?: Total Help from another person bathing (including washing, rinsing, drying)?: Total Help from another person to put on and taking off regular upper body clothing?: Total Help from another person to put on and  taking off regular lower body clothing?: Total 6 Click Score: 7   End of Session Nurse Communication: Mobility status;Precautions;Need for lift equipment  Activity Tolerance: Patient limited by fatigue;No increased pain Patient left: in bed;with call bell/phone within reach;with bed alarm set  OT Visit Diagnosis: Unsteadiness on feet (R26.81);Muscle weakness (generalized) (M62.81);Pain Pain - Right/Left: Right Pain - part of body: Leg                Time: 1349-1359 OT Time Calculation (min): 10 min Charges:  OT General Charges $OT Visit: 1 Visit OT Evaluation $OT Eval High Complexity: 1 High   Brynn, OTR/L  Acute Rehabilitation Services Pager: [phone] Office: [phone] .   Mateo Flow 04/07/2019, 3:37 PM

## 2019-04-08 LAB — COMPREHENSIVE METABOLIC PANEL
ALT: 13 U/L (ref 0–44)
AST: 23 U/L (ref 15–41)
Albumin: 1.8 g/dL — ABNORMAL LOW (ref 3.5–5.0)
Alkaline Phosphatase: 92 U/L (ref 38–126)
Anion gap: 12 (ref 5–15)
BUN: 15 mg/dL (ref 8–23)
CO2: 24 mmol/L (ref 22–32)
Calcium: 8.3 mg/dL — ABNORMAL LOW (ref 8.9–10.3)
Chloride: 104 mmol/L (ref 98–111)
Creatinine, Ser: 0.7 mg/dL (ref 0.44–1.00)
GFR calc Af Amer: >60 mL/min (ref >60)
GFR calc non Af Amer: >60 mL/min (ref >60)
Glucose, Bld: 89 mg/dL (ref 70–99)
Potassium: 4.2 mmol/L (ref 3.5–5.1)
Sodium: 140 mmol/L (ref 135–145)
Total Bilirubin: 0.9 mg/dL (ref 0.3–1.2)
Total Protein: 5.1 g/dL — ABNORMAL LOW (ref 6.5–8.1)

## 2019-04-08 LAB — CBC WITH DIFFERENTIAL/PLATELET
Abs Immature Granulocytes: 0 10*3/uL (ref 0.00–0.07)
Basophils Absolute: 0 10*3/uL (ref 0.0–0.1)
Basophils Relative: 0 %
Eosinophils Absolute: 0 10*3/uL (ref 0.0–0.5)
Eosinophils Relative: 0 %
HCT: 25.5 % — ABNORMAL LOW (ref 36.0–46.0)
Hemoglobin: 8.2 g/dL — ABNORMAL LOW (ref 12.0–15.0)
Lymphocytes Relative: 11 %
Lymphs Abs: 1.6 10*3/uL (ref 0.7–4.0)
MCH: 30.6 pg (ref 26.0–34.0)
MCHC: 32.2 g/dL (ref 30.0–36.0)
MCV: 95.1 fL (ref 80.0–100.0)
Monocytes Absolute: 0.1 10*3/uL (ref 0.1–1.0)
Monocytes Relative: 1 %
Neutro Abs: 12.5 10*3/uL — ABNORMAL HIGH (ref 1.7–7.7)
Neutrophils Relative %: 88 %
Platelets: 159 10*3/uL (ref 150–400)
RBC: 2.68 MIL/uL — ABNORMAL LOW (ref 3.87–5.11)
RDW: 15.5 % (ref 11.5–15.5)
WBC: 14.2 10*3/uL — ABNORMAL HIGH (ref 4.0–10.5)
nRBC: 0 % (ref 0.0–0.2)
nRBC: 0 /100 WBC

## 2019-04-08 LAB — PROTIME-INR
INR: 1.3 — ABNORMAL HIGH (ref 0.8–1.2)
Prothrombin Time: 15.9 seconds — ABNORMAL HIGH (ref 11.4–15.2)

## 2019-04-08 LAB — HEPARIN LEVEL (UNFRACTIONATED): Heparin Unfractionated: 0.36 IU/mL (ref 0.30–0.70)

## 2019-04-08 LAB — MAGNESIUM: Magnesium: 1.8 mg/dL (ref 1.7–2.4)

## 2019-04-08 LAB — PHOSPHORUS: Phosphorus: 3 mg/dL (ref 2.5–4.6)

## 2019-04-08 MED ORDER — MAGNESIUM SULFATE 2 GM/50ML IV SOLN
2.0000 g | Freq: Once | INTRAVENOUS | Status: AC
  Administered 2019-04-08: 2 g via INTRAVENOUS
  Filled 2019-04-08: qty 50

## 2019-04-08 MED ORDER — WARFARIN - PHARMACIST DOSING INPATIENT: Freq: Every day | Status: DC

## 2019-04-08 MED ORDER — WARFARIN SODIUM 5 MG PO TABS
5.0000 mg | ORAL_TABLET | Freq: Once | ORAL | Status: AC
  Administered 2019-04-08: 17:00:00 5 mg via ORAL
  Filled 2019-04-08: qty 1

## 2019-04-08 MED ORDER — HEPARIN (PORCINE) 25000 UT/250ML-% IV SOLN
1250.0000 [IU]/h | INTRAVENOUS | Status: DC
  Administered 2019-04-08 – 2019-04-09 (×3): 1250 [IU]/h via INTRAVENOUS
  Filled 2019-04-08 (×2): qty 250

## 2019-04-08 MED ORDER — PANTOPRAZOLE SODIUM 40 MG PO TBEC
40.0000 mg | DELAYED_RELEASE_TABLET | Freq: Every day | ORAL | Status: DC
  Administered 2019-04-09 – 2019-04-17 (×7): 40 mg via ORAL
  Filled 2019-04-08 (×9): qty 1

## 2019-04-08 NOTE — Progress Notes (Signed)
PROGRESS NOTE    Robin Snow  ESP:[id number] DOB: October 13, 1940 DOA: 03/31/2019 PCP: [name], MD   Brief Narrative:  Robin Snow a 79 y.o.femalewith medical history significant ofa.fib, HTN, CKD, stroke. Patient had R TKA in [place] in 2019. Patient recently admitted from 10/30-12/23 for dislocation of R knee prosthesis with septic joint. Closed reduction by Dr. Doreatha [place] 11/18. 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 Ultimately underwent R AKA by [name]. This complicated by sepsis from E.Coli bacteremia, spent several days in ICU on vent. Also complicated by AKA wound dehissance.Patient finally discharged 12/23 to [facility] care on rocephin to complete 6 weeks of therapy with end date of 1/6.   For a full recounting of the prolonged, nearly 2 month hospital stay, please see Dr. Lisbeth Ply discharge summary on 12/23.  Per SNF: patient didn't get rocephin on (12/24). At around 7pm they noticed asmall amount ofbloodydrainage from dressingand noticedfoulodor from wound. At 745pm noticed dressing was saturated and at 9pm now oozing blood around dressing. Patient was noted to be running fever of 100.7 at facility. Facility confirms that coumadin is still on hold as per DC summary plan.  ED Course:No fever in ED, lab work actually looks fairly stable compared to 2 days ago.  CT of RLE femur is worrisome for hematoma with probable superimposed infection extending from amputation site.  She was admitted under hospital service with Ortho to consult.  She was given Rocephin and vancomycin in ED.  She is scheduled to have right hip disarticulation with [name].  She underwent hip disarticulation in the OR on 04/05/19 and postoperatively she had some hemorrhagic shock and became hypotensive received 3 units of albumin Neo-Synephrine as well as 3 units of PRBCs and was transferred to the intensive care unit for further  monitoring.  She is now hemodynamically stable and improved and has been transferred to PCU.  She is much more awake and alert and does have some anemia which we will continue to monitor and is stabilized.  PT OT recommending skilled nursing facility.  I spoke with orthopedics [name] who recommends resuming her home anticoagulation and she will be started on a heparin drip with a Coumadin bridge given her elevated CHA2DS2-VASc score of 7.  Assessment & Plan:   Principal Problem:   Wound dehiscence Active Problems:   Essential hypertension   AF (paroxysmal atrial fibrillation) (HCC)   Bacteremia due to Escherichia coli   S/P AKA (above knee amputation), right (HCC)   Cellulitis of right lower extremity   Dehiscence of amputation stump (HCC)   Wound dehiscence/infection of amputation site/right stump status post hip disarticulation postoperative day 3 -Wound care on board.  Per Dr. Jess Barters consultation note, patient scheduled to have right hip disarticulation and this was done 04/05/20 and postoperatively she was transferred to the ICU for post hemorrhagic shock this is now improved and will be transferred to the progressive care unit -Has a wound VAC in place -S/p 3 Units of pRBC's  -PCCM has been consulted given her hemorrhagic shock but she is hemodynamically stable -Further care per orthopedic surgery -Currently on IV Dilaudid 0.5 mg every 4 as needed for severe pain, p.o. oxycodone IR and oxycodone acetaminophen 1 tab p.o. twice daily as needed Bowel regimen in place -PCCM was consulted for hemorrhagic shock but she is stable and she is being transferred to the stepdown unit now -Per orthopedic surgery the patient had extensive necrotic muscles  including muscles originating in the pelvis and tissue were sent for cultures and patient will need long-term antibiotics and she is currently on Rocephin 2 g IV daily and may need further adjustments based on her cultures and sensitivities;  surgical pathology results showed a gangrenous necrosis of skin and soft tissue with atheroma formation with a luminal stenosis and dystrophic calcifications of vasculature and it stated that the gangrenous necrosis is extensive soft tissue resection margin; I do not see the sensitivities in EPIC -WBC went from 9.7 -> 13.4 -> 15.3 -> 14.2 -Patient VT prophylaxis now resumed and I spoke with pharmacy and [name] and he is okay with her going back on full dose anticoagulation with Coumadin and will bridge with heparin  Acute Blood Loss Anemia from Surgical Intervention -Patient's hemoglobin/hematocrit went from 9.5/30.6 and dropped down to 7.1/21.01 and was transfused 3 units of PRBCs -BP after transfusion was 10.6/31.7 and now started dropping again and went to 9.5/28.0 -> 8.3/25.5 -> 8.2/25.5 this AM and is Stable  -Continue to monitor for signs and symptoms of bleeding at time of expected postoperative drop given the complexity of her surgery -Continue with iron supplementation ferrous sulfate 300 mg/5 mL p.o. twice daily -Continue to monitor for signs and symptoms of bleeding; currently no overt bleeding noted -Has a wound VAC in place and had approximately 800 cc of drainage from surgery -Coumadin was still on Hold but per my discussion with [name] he states that it is okay to resume anticoagulation with Coumadin and will bridge her with a heparin drip given her elevated chads vas score  E. coli Bacteremia/Endocarditis:  -Continue Rocephin 2 g IV daily until 04/11/2018 per ID recommendations from recent hospitalization.  Permanent Atrial Fibrillation was in sinus tachycardia this morning -Rates fluctuating but mostly controlled without symptoms and they were ranging in the 120s this morning.   Resume home dose of metoprolol, Cardizem and amiodarone  -Coumadin was on hold due to surgical intervention  -Will resume anticoagulation now that she is cleared by orthopedic surgery and will bridge  with a heparin drip given her elevated CHA2DS2-VASc of 7 -We will need to monitor very carefully about signs and symptoms of bleeding   Essential Hypertension: -Controlled. -Initially held diltiazem and metoprolol she becomes hypotensive but resumed today -Went into post hemorrhagic shock and had to be placed on Neo-Synephrine in the OR and given albumin and 3 units of PRBCs -Pressures have improved and was 113/59 -Resume antihypertensives  Obesity Severe malnutrition in the context of acute illness/injury -Estimated body mass index is 30.21 kg/m as calculated from the following:   Height as of this encounter: '5\' 7"'$  (1.702 m).   Weight as of this encounter: 87.5 kg. -Weight Loss and Dietary Counseling given  -Patient is consulted for further evaluation recommendations and recommending Ensure Enlive p.o. 3 times daily along with Magic cup 3 times daily with meals and multivitamin with minerals daily as well as liberalizing diet to help improve p.o. intake  Hyperlipidemia -Continue Pravastatin 20 g p.o. nightly  GERD  -Continue Pantoprazole 40 mg p.o. daily  Hypomagnesemia -Patient's Mag Level was 1.8 -Replete with IV Mag Sulfate 2 grams today  -Continue to Monitor and Replete as Necessary -Repeat Mag Level in the AM  Hyperbilirubinemia, improved  -Patient's T Bili was 2.2 and improved to 0.9 -Likely Reactive -Continue to Monitor and Trend -Repeat CMP in AM   Leukocytosis -Likely reactive in the setting of Surgical intervention and Hip Disarticulation -Patients WBC went  from  9.7 -> 13.4 -> 15.3 -> 14.3 -Continue to Monitor for S/Sx of Infection -Repeat CBC in AM   DVT prophylaxis: SCDs; Now on Heparin 5,000 units sq q8h has been discontinued. Will be resuming Coumadin now that it is okay with orthopedic surgery and will bridge with Heparin gtt Code Status: FULL CODE  Family Communication: No family present at bedside  Disposition Plan: SNF When bed  available  Consultants:  Orthopedic Surgery  PCCM/Pulmonary   Procedures:  HIP DISARTICULATION done by [name]    Antimicrobials:  Anti-infectives (From admission, onward)   Start     Dose/Rate Route Frequency Ordered Stop   04/05/19 1400  ceFAZolin (ANCEF) IVPB 2g/100 mL premix     2 g 200 mL/hr over 30 Minutes Intravenous On call to O.R. 04/05/19 0723 04/05/19 1100   04/01/19 0500  cefTRIAXone (ROCEPHIN) IVPB  Status:  Discontinued    Note to Pharmacy: Indication:  E Coli Endocarditis Last Day of Therapy:  04/12/2019 Labs - Once weekly:  CBC/D and BMP, Labs - Every other week:  ESR and CRP     2 g Intravenous Every 24 hours 03/31/19 0436 03/31/19 0500   04/01/19 0400  cefTRIAXone (ROCEPHIN) 2 g in sodium chloride 0.9 % 100 mL IVPB     2 g 200 mL/hr over 30 Minutes Intravenous Every 24 hours 03/31/19 0501 04/13/19 0359   03/31/19 0400  vancomycin (VANCOCIN) IVPB 1000 mg/200 mL premix     1,000 mg 200 mL/hr over 60 Minutes Intravenous  Once 03/31/19 0356 03/31/19 0606   03/31/19 0100  cefTRIAXone (ROCEPHIN) 2 g in sodium chloride 0.9 % 100 mL IVPB     2 g 200 mL/hr over 30 Minutes Intravenous  Once 03/31/19 0100 03/31/19 0329     Subjective: Seen and examined at bedside and she was wanting to sleep.  Denies any complaints at this time and had some pain when I palpate her right side of her abdomen.  No nausea or vomiting.  No other concerns or complaints at this time.  Objective: Vitals:   04/07/19 2015 04/07/19 2322 04/08/19 0818 04/08/19 1231  BP: (!) 121/56 (!) 132/57 (!) 145/72 (!) 113/59  Pulse: (!) 59 66 66 60  Resp: '18 12 11 14  '$ Temp: 98.5 F (36.9 C) 98.4 F (36.9 C) 99.3 F (37.4 C) 98.5 F (36.9 C)  TempSrc: Oral Oral Oral Oral  SpO2: 100% 99% 99% 99%  Weight:      Height:        Intake/Output Summary (Last 24 hours) at 04/08/2019 1235 Last data filed at 04/08/2019 1000 Gross per 24 hour  Intake 10 ml  Output 1000 ml  Net -990 ml   Filed Weights    04/01/19 1023 04/05/19 1705  Weight: 102 kg 87.5 kg   Examination: Physical Exam:  Constitutional: WN/WD obese AAF currently in NAD and appears calm and was sleeping when I walked in  Eyes: Lids normal  ENMT: External Ears, Nose appear normal. Grossly normal hearing. Mucous membranes are moist. .  Neck: Appears normal, supple, no cervical masses, normal ROM, no appreciable thyromegaly; no JVD Respiratory: Mildly diminished to auscultation bilaterally, no wheezing, rales, rhonchi or crackles. Normal respiratory effort and patient is not tachypenic. No accessory muscle use.  Unlabored breathing Cardiovascular: She is a paced rhythm, no murmurs / rubs / gallops. S1 and S2 auscultated.  Trace lower extremity edema in the left leg Abdomen: Soft, tender to palpate, distended secondary to body habitus.  Bowel sounds positive.  GU: Deferred. Musculoskeletal: Has a right hip disarticulation with a wound VAC in place on the right side of the abdomen Skin: No rashes, lesions, ulcers on limited skin evaluation. No induration; Warm and dry.  Neurologic: CN 2-12 grossly intact with no focal deficits. Romberg sign and cerebellar reflexes not assessed.  Psychiatric: Normal judgment and insight. A little somnolent but easily arousable. Normal mood and appropriate affect.   Data Reviewed: I have personally reviewed following labs and imaging studies  CBC: Recent Labs  Lab 04/04/19 0422 04/05/19 0448 04/05/19 1504 04/05/19 2152 04/06/19 0525 04/07/19 0305 04/08/19 0652  WBC 6.3 9.7  --   --  13.4* 15.3* 14.2*  NEUTROABS 3.0 5.7  --   --  9.8* 10.8* 12.5*  HGB 9.2* 9.5* 7.5* 10.6* 9.5* 8.3* 8.2*  HCT 29.0* 30.6* 22.0* 31.7* 28.0* 25.5* 25.5*  MCV 95.1 95.3  --   --  91.8 95.1 95.1  PLT 251 221  --   --  173 161 248   Basic Metabolic Panel: Recent Labs  Lab 04/03/19 0922 04/04/19 0422 04/05/19 0448 04/05/19 1211 04/05/19 1504 04/06/19 0525 04/07/19 0305 04/08/19 0652  NA  --  139 139 139  141 140 139 140  K  --  3.9 3.7 4.2 4.1 4.0 3.8 4.2  CL  --  103 103  --   --  106 105 104  CO2  --  27 26  --   --  '27 27 24  '$ GLUCOSE  --  92 108*  --   --  138* 99 89  BUN  --  12 13  --   --  '15 16 15  '$ CREATININE  --  0.86 0.90  --   --  0.83 0.85 0.70  CALCIUM  --  8.3* 8.4*  --   --  8.6* 8.3* 8.3*  MG 1.7  --   --   --   --  1.5* 2.2 1.8  PHOS  --   --   --   --   --  3.5 2.9 3.0   GFR: Estimated Creatinine Clearance: 64.8 mL/min (by C-G formula based on SCr of 0.7 mg/dL). Liver Function Tests: Recent Labs  Lab 04/06/19 0525 04/07/19 0305 04/08/19 0652  AST 13* 14* 23  ALT '12 9 13  '$ ALKPHOS 81 83 92  BILITOT 2.2* 0.9 0.9  PROT 4.8* 4.8* 5.1*  ALBUMIN 2.2* 1.9* 1.8*   No results for input(s): LIPASE, AMYLASE in the last 168 hours. No results for input(s): AMMONIA in the last 168 hours. Coagulation Profile: No results for input(s): INR, PROTIME in the last 168 hours. Cardiac Enzymes: No results for input(s): CKTOTAL, CKMB, CKMBINDEX, TROPONINI in the last 168 hours. BNP (last 3 results) No results for input(s): PROBNP in the last 8760 hours. HbA1C: No results for input(s): HGBA1C in the last 72 hours. CBG: Recent Labs  Lab 04/05/19 2311 04/06/19 0308 04/06/19 0727 04/06/19 1134 04/06/19 1506  GLUCAP 141* 136* 122* 128* 149*   Lipid Profile: No results for input(s): CHOL, HDL, LDLCALC, TRIG, CHOLHDL, LDLDIRECT in the last 72 hours. Thyroid Function Tests: No results for input(s): TSH, T4TOTAL, FREET4, T3FREE, THYROIDAB in the last 72 hours. Anemia Panel: No results for input(s): VITAMINB12, FOLATE, FERRITIN, TIBC, IRON, RETICCTPCT in the last 72 hours. Sepsis Labs: No results for input(s): PROCALCITON, LATICACIDVEN in the last 168 hours.  Recent Results (from the past 240 hour(s))  Blood culture (routine x 2)  Status: None   Collection Time: 03/31/19 12:44 AM   Specimen: BLOOD LEFT HAND  Result Value Ref Range Status   Specimen Description BLOOD LEFT  HAND  Final   Special Requests   Final    BOTTLES DRAWN AEROBIC ONLY Blood Culture results may not be optimal due to an inadequate volume of blood received in culture bottles   Culture   Final    NO GROWTH 5 DAYS Performed at [facility], [place] [address] [place], [place] [address]    Report Status 04/05/2019 FINAL  Final  Blood culture (routine x 2)     Status: None   Collection Time: 03/31/19 12:45 AM   Specimen: BLOOD  Result Value Ref Range Status   Specimen Description BLOOD LEFT ARM  Final   Special Requests   Final    BOTTLES DRAWN AEROBIC ONLY Blood Culture adequate volume   Culture   Final    NO GROWTH 5 DAYS Performed at [facility], [address] [place], [place] [address]    Report Status 04/05/2019 FINAL  Final  SARS CORONAVIRUS 2 (TAT 6-24 HRS) Nasopharyngeal Nasopharyngeal Swab     Status: None   Collection Time: 03/31/19  2:26 AM   Specimen: Nasopharyngeal Swab  Result Value Ref Range Status   SARS Coronavirus 2 NEGATIVE NEGATIVE Final    Comment: (NOTE) SARS-CoV-2 target nucleic acids are NOT DETECTED. The SARS-CoV-2 RNA is generally detectable in upper and lower respiratory specimens during the acute phase of infection. Negative results do not preclude SARS-CoV-2 infection, do not rule out co-infections with other pathogens, and should not be used as the sole basis for treatment or other patient management decisions. Negative results must be combined with clinical observations, patient history, and epidemiological information. The expected result is Negative. Fact Sheet for Patients: SugarRoll.be Fact Sheet for Healthcare Providers: https://www.woods-mathews.com/ This test is not yet approved or cleared by the [place] FDA and  has been authorized for detection and/or diagnosis of SARS-CoV-2 by FDA under an Emergency Use Authorization (EUA). This EUA will remain  in effect (meaning this test can be  used) for the duration of the COVID-19 declaration under Section 56 4(b)(1) of the Act, 21 U.S.C. section 360bbb-3(b)(1), unless the authorization is terminated or revoked sooner. Performed at [facility], [place] [address] [place], [place] [address]   Urine culture     Status: Abnormal   Collection Time: 03/31/19  2:43 AM   Specimen: Urine, Random  Result Value Ref Range Status   Specimen Description URINE, RANDOM  Final   Special Requests NONE  Final   Culture (A)  Final    <10,000 COLONIES/mL INSIGNIFICANT GROWTH Performed at [facility], [place] [address] [place], [place] [address]    Report Status 04/01/2019 FINAL  Final  Surgical pcr screen     Status: None   Collection Time: 04/04/19 11:13 PM   Specimen: Nasal Mucosa; Nasal Swab  Result Value Ref Range Status   MRSA, PCR NEGATIVE NEGATIVE Final   Staphylococcus aureus NEGATIVE NEGATIVE Final    Comment: (NOTE) The Xpert SA Assay (FDA approved for NASAL specimens in patients 35 years of age and older), is one component of a comprehensive surveillance program. It is not intended to diagnose infection nor to guide or monitor treatment. Performed at [facility], [place] [address] [place], [place] [address]     RN Pressure Injury Documentation: Pressure Injury 03/01/19 Buttocks Medial Stage  II -  Partial thickness loss of dermis presenting as a shallow open ulcer with a red, pink wound bed without slough. (Active)  03/01/19 1732  Location: Buttocks  Location Orientation: Medial  Staging: Stage II -  Partial thickness loss of dermis presenting as a shallow open ulcer with a red, pink wound bed without slough.  Wound Description (Comments):   Present on Admission: Yes   Radiology Studies: No results found.  Scheduled Meds: . sodium chloride   Intravenous Once  . amiodarone  200 mg Oral Daily  . Chlorhexidine Gluconate Cloth  6 each Topical Daily  . diltiazem  120 mg Oral Daily  . docusate  100 mg Oral  BID  . feeding supplement (ENSURE ENLIVE)  237 mL Oral TID BM  . ferrous sulfate  300 mg Oral BID WC  . gabapentin  300 mg Oral Q12H  . metoprolol tartrate  50 mg Oral BID  . multivitamin with minerals  1 tablet Oral Daily  . [START ON 04/09/2019] pantoprazole  40 mg Oral Daily  . pravastatin  20 mg Oral q1800  . senna-docusate  2 tablet Oral BID  . sodium chloride flush  10-40 mL Intracatheter Q12H  . sodium chloride flush  3 mL Intravenous Once   Continuous Infusions: . cefTRIAXone (ROCEPHIN)  IV 2 g (04/08/19 0358)  . heparin    . magnesium sulfate bolus IVPB      LOS: 8 days    Kerney Elbe, DO [facility] PAGER is on [place]  If 7PM-7AM, please contact night-coverage [email or web address]

## 2019-04-08 NOTE — Progress Notes (Signed)
[place] for heparin and warfarin Indication: atrial fibrillation  Allergies  Allergen Reactions  . Aspirin Hives and Swelling    Angioedema   . Rofecoxib Hives  . Hydrocodone Hives    Tolerates oxycodone and tramadol    Patient Measurements: Height: 5\' 7"  (170.2 cm) Weight: 192 lb 14.4 oz (87.5 kg) IBW/kg (Calculated) : 61.6 Heparin Dosing Weight: 84.5  Vital Signs: Temp: 98.6 F (37 C) (01/02 2100) Temp Source: Oral (01/02 2100) BP: 123/60 (01/02 2100) Pulse Rate: 60 (01/02 2100)  Labs: Recent Labs    04/06/19 0525 04/07/19 0305 04/08/19 0652 04/08/19 1238 04/08/19 2044  HGB 9.5* 8.3* 8.2*  --   --   HCT 28.0* 25.5* 25.5*  --   --   PLT 173 161 159  --   --   LABPROT  --   --   --  15.9*  --   INR  --   --   --  1.3*  --   HEPARINUNFRC  --   --   --   --  0.36  CREATININE 0.83 0.85 0.70  --   --     Estimated Creatinine Clearance: 64.8 mL/min (by C-G formula based on SCr of 0.7 mg/dL).   Medical History: Past Medical History:  Diagnosis Date  . AKI (acute kidney injury) ([place]) 04/2016  . Anemia   . Arthritis   . Chronic atrial fibrillation ([place])   . Chronic edema   . Chronic venous insufficiency   . CKD (chronic kidney disease), stage III   . Coagulopathy ([place])   . Diabetes mellitus    Type 2  . Diverticulosis   . Dyslipidemia   . GERD (gastroesophageal reflux disease)   . GI bleed 2015   a. lower GI from diverticular source 2015.  [name] Hx of transfusion of packed red blood cells   . Hypertension   . Morbid obesity ([place])   . Obstructive sleep apnea    on CPAP  . Pulmonary hypertension ([place])   . Pyelonephritis 04/2016  . Renal infarct ([place])    a. 04/2016- adm with flank pain, ? pyelo vs renal infarct in setting of recent subtherapeutic INR.  [name] Sinus bradycardia   . Stroke [facility]) 2015    Medications:  Scheduled:  . sodium chloride   Intravenous Once  . amiodarone  200 mg Oral Daily  . Chlorhexidine  Gluconate Cloth  6 each Topical Daily  . diltiazem  120 mg Oral Daily  . docusate  100 mg Oral BID  . feeding supplement (ENSURE ENLIVE)  237 mL Oral TID BM  . ferrous sulfate  300 mg Oral BID WC  . gabapentin  300 mg Oral Q12H  . metoprolol tartrate  50 mg Oral BID  . multivitamin with minerals  1 tablet Oral Daily  . [START ON 04/09/2019] pantoprazole  40 mg Oral Daily  . pravastatin  20 mg Oral q1800  . senna-docusate  2 tablet Oral BID  . sodium chloride flush  10-40 mL Intracatheter Q12H  . sodium chloride flush  3 mL Intravenous Once  . Warfarin - Pharmacist Dosing Inpatient   Does not apply q1800   Infusions:  . cefTRIAXone (ROCEPHIN)  IV 2 g (04/08/19 0358)  . heparin 1,250 Units/hr (04/08/19 1303)    Assessment: Pt is a 79 y/o female with a PMH of a.fib, HTN, CKD, and stroke. She has a R TKA in [place] in 2018, but had to be recently admitted  from 10/30 - 12/24 for dislocation of R knee prosthesis with septic joint. Pt had a R AKA by Dr. Lajoyce Corners as definitive treatment but treatment was complicated by wound infection and hemorrhagic shock that required an ICU stay and PRBCs. She is not POD 2 from right hip disarticulation. Warfarin has been on hold with last dose on 12/13. Pharmacy has been consulted to resume anticoagulation with heparin and warfarin. -initial heparin level at goal   Goal of Therapy:  INR 2-3 Heparin level 0.3-0.7 units/ml Monitor platelets by anticoagulation protocol: Yes   Plan:  -No heparin changes needed -Daily heparin level , CBC and INR  Harland German, PharmD Clinical Pharmacist **Pharmacist phone directory can now be found on [email or web address] ([email or web address]).  Listed under [facility].

## 2019-04-08 NOTE — Progress Notes (Signed)
ANTICOAGULATION CONSULT NOTE - Initial Consult  Pharmacy Consult for heparin and warfarin Indication: atrial fibrillation  Allergies  Allergen Reactions  . Aspirin Hives and Swelling    Angioedema   . Rofecoxib Hives  . Hydrocodone Hives    Tolerates oxycodone and tramadol    Patient Measurements: Height: 5\' 7"  (170.2 cm) Weight: 192 lb 14.4 oz (87.5 kg) IBW/kg (Calculated) : 61.6 Heparin Dosing Weight: 84.5  Vital Signs: Temp: 98.5 F (36.9 C) (01/02 1231) Temp Source: Oral (01/02 1231) BP: 113/59 (01/02 1231) Pulse Rate: 60 (01/02 1231)  Labs: Recent Labs    04/06/19 0525 04/07/19 0305 04/08/19 0652  HGB 9.5* 8.3* 8.2*  HCT 28.0* 25.5* 25.5*  PLT 173 161 159  CREATININE 0.83 0.85 0.70    Estimated Creatinine Clearance: 64.8 mL/min (by C-G formula based on SCr of 0.7 mg/dL).   Medical History: Past Medical History:  Diagnosis Date  . AKI (acute kidney injury) ([place]) 04/2016  . Anemia   . Arthritis   . Chronic atrial fibrillation ([place])   . Chronic edema   . Chronic venous insufficiency   . CKD (chronic kidney disease), stage III   . Coagulopathy ([place])   . Diabetes mellitus    Type 2  . Diverticulosis   . Dyslipidemia   . GERD (gastroesophageal reflux disease)   . GI bleed 2015   a. lower GI from diverticular source 2015.  [name] Hx of transfusion of packed red blood cells   . Hypertension   . Morbid obesity ([place])   . Obstructive sleep apnea    on CPAP  . Pulmonary hypertension ([place])   . Pyelonephritis 04/2016  . Renal infarct ([place])    a. 04/2016- adm with flank pain, ? pyelo vs renal infarct in setting of recent subtherapeutic INR.  [name] Sinus bradycardia   . Stroke [facility]) 2015    Medications:  Scheduled:  . sodium chloride   Intravenous Once  . amiodarone  200 mg Oral Daily  . Chlorhexidine Gluconate Cloth  6 each Topical Daily  . diltiazem  120 mg Oral Daily  . docusate  100 mg Oral BID  . feeding supplement (ENSURE ENLIVE)  237 mL Oral TID BM   . ferrous sulfate  300 mg Oral BID WC  . gabapentin  300 mg Oral Q12H  . metoprolol tartrate  50 mg Oral BID  . multivitamin with minerals  1 tablet Oral Daily  . [START ON 04/09/2019] pantoprazole  40 mg Oral Daily  . pravastatin  20 mg Oral q1800  . senna-docusate  2 tablet Oral BID  . sodium chloride flush  10-40 mL Intracatheter Q12H  . sodium chloride flush  3 mL Intravenous Once  . warfarin  5 mg Oral ONCE-1800  . Warfarin - Pharmacist Dosing Inpatient   Does not apply q1800   Infusions:  . cefTRIAXone (ROCEPHIN)  IV 2 g (04/08/19 0358)  . heparin    . magnesium sulfate bolus IVPB      Assessment: Pt is a 79 y/o female with a PMH of a.fib, HTN, CKD, and stroke. She has a R TKA in [place] in 2018, but had to be recently admitted from 10/30 - 12/24 for dislocation of R knee prosthesis with septic joint. Pt had a R AKA by [name] as definitive treatment but treatment was complicated by wound infection and hemorrhagic shock that required an ICU stay and PRBCs. She is not POD 2 from right hip disarticulation. Warfarin has been on hold with  last dose on 12/13. Pharmacy has been consulted to resume anticoagulation with heparin and warfarin.   INR on 12/24 was 1.2. Hg/Hct are low at 8.2/25.2. Patient's CHA2DS2VASc score is high at 7. Of note, patient is on amiodarone but the interaction should be stable as this was a PTA medication.   Goal of Therapy:  INR 2-3 Heparin level 0.3-0.7 units/ml Monitor platelets by anticoagulation protocol: Yes   Plan:  Start heparin infusion at 1250 units/hr Check anti-Xa level in 8 hours and daily while on heparin Continue to monitor H&H and platelets  Give warfarin 5 mg x 1 tonight Check INR daily Watch for signs/symptoms of bleeding  Robin Snow, PharmD PGY1 Acute Care Pharmacy Resident 04/08/2019,12:38 PM

## 2019-04-09 ENCOUNTER — Inpatient Hospital Stay (HOSPITAL_COMMUNITY): Payer: Medicare HMO

## 2019-04-09 LAB — TYPE AND SCREEN
ABO/RH(D): A POS
Antibody Screen: POSITIVE
Donor AG Type: NEGATIVE
Unit division: 0
Unit division: 0
Unit division: 0
Unit division: 0
Unit division: 0
Unit division: 0
Unit division: 0

## 2019-04-09 LAB — MAGNESIUM
Magnesium: 2.2 mg/dL (ref 1.7–2.4)
Magnesium: 1.9 mg/dL (ref 1.7–2.4)

## 2019-04-09 LAB — BPAM RBC
Blood Product Expiration Date: 202101152359
Blood Product Expiration Date: 202101152359
Blood Product Expiration Date: 202101152359
Blood Product Expiration Date: 202101152359
Blood Product Expiration Date: 202101242359
Blood Product Expiration Date: 202101262359
Blood Product Expiration Date: 202101262359
ISSUE DATE / TIME: 202012301338
ISSUE DATE / TIME: 202012301354
ISSUE DATE / TIME: 202012301800
Unit Type and Rh: 6200
Unit Type and Rh: 6200
Unit Type and Rh: 6200
Unit Type and Rh: 6200
Unit Type and Rh: 6200
Unit Type and Rh: 9500
Unit Type and Rh: 9500

## 2019-04-09 LAB — COMPREHENSIVE METABOLIC PANEL
ALT: 18 U/L (ref 0–44)
AST: 27 U/L (ref 15–41)
Albumin: 1.5 g/dL — ABNORMAL LOW (ref 3.5–5.0)
Alkaline Phosphatase: 94 U/L (ref 38–126)
Anion gap: 8 (ref 5–15)
BUN: 14 mg/dL (ref 8–23)
CO2: 25 mmol/L (ref 22–32)
Calcium: 7.7 mg/dL — ABNORMAL LOW (ref 8.9–10.3)
Chloride: 102 mmol/L (ref 98–111)
Creatinine, Ser: 0.69 mg/dL (ref 0.44–1.00)
GFR calc Af Amer: >60 mL/min (ref >60)
GFR calc non Af Amer: >60 mL/min (ref >60)
Glucose, Bld: 162 mg/dL — ABNORMAL HIGH (ref 70–99)
Potassium: 3.9 mmol/L (ref 3.5–5.1)
Sodium: 135 mmol/L (ref 135–145)
Total Bilirubin: 0.5 mg/dL (ref 0.3–1.2)
Total Protein: 4.6 g/dL — ABNORMAL LOW (ref 6.5–8.1)

## 2019-04-09 LAB — BASIC METABOLIC PANEL
Anion gap: 8 (ref 5–15)
BUN: 15 mg/dL (ref 8–23)
CO2: 26 mmol/L (ref 22–32)
Calcium: 8.2 mg/dL — ABNORMAL LOW (ref 8.9–10.3)
Chloride: 106 mmol/L (ref 98–111)
Creatinine, Ser: 0.7 mg/dL (ref 0.44–1.00)
GFR calc Af Amer: >60 mL/min (ref >60)
GFR calc non Af Amer: >60 mL/min (ref >60)
Glucose, Bld: 126 mg/dL — ABNORMAL HIGH (ref 70–99)
Potassium: 4.5 mmol/L (ref 3.5–5.1)
Sodium: 140 mmol/L (ref 135–145)

## 2019-04-09 LAB — CBC WITH DIFFERENTIAL/PLATELET
Abs Immature Granulocytes: 0 10*3/uL (ref 0.00–0.07)
Basophils Absolute: 0 10*3/uL (ref 0.0–0.1)
Basophils Relative: 0 %
Eosinophils Absolute: 0 10*3/uL (ref 0.0–0.5)
Eosinophils Relative: 0 %
HCT: 24 % — ABNORMAL LOW (ref 36.0–46.0)
Hemoglobin: 7.7 g/dL — ABNORMAL LOW (ref 12.0–15.0)
Lymphocytes Relative: 5 %
Lymphs Abs: 1.1 10*3/uL (ref 0.7–4.0)
MCH: 31.2 pg (ref 26.0–34.0)
MCHC: 32.1 g/dL (ref 30.0–36.0)
MCV: 97.2 fL (ref 80.0–100.0)
Monocytes Absolute: 1.5 10*3/uL — ABNORMAL HIGH (ref 0.1–1.0)
Monocytes Relative: 7 %
Neutro Abs: 18.7 10*3/uL — ABNORMAL HIGH (ref 1.7–7.7)
Neutrophils Relative %: 88 %
Platelets: 179 10*3/uL (ref 150–400)
RBC: 2.47 MIL/uL — ABNORMAL LOW (ref 3.87–5.11)
RDW: 15.4 % (ref 11.5–15.5)
WBC: 21.2 10*3/uL — ABNORMAL HIGH (ref 4.0–10.5)
nRBC: 0 /100 WBC
nRBC: 0.1 % (ref 0.0–0.2)

## 2019-04-09 LAB — CBC
HCT: 26.8 % — ABNORMAL LOW (ref 36.0–46.0)
Hemoglobin: 8.5 g/dL — ABNORMAL LOW (ref 12.0–15.0)
MCH: 30.7 pg (ref 26.0–34.0)
MCHC: 31.7 g/dL (ref 30.0–36.0)
MCV: 96.8 fL (ref 80.0–100.0)
Platelets: 124 10*3/uL — ABNORMAL LOW (ref 150–400)
RBC: 2.77 MIL/uL — ABNORMAL LOW (ref 3.87–5.11)
RDW: 15.6 % — ABNORMAL HIGH (ref 11.5–15.5)
WBC: 16.9 10*3/uL — ABNORMAL HIGH (ref 4.0–10.5)
nRBC: 0.1 % (ref 0.0–0.2)

## 2019-04-09 LAB — PHOSPHORUS: Phosphorus: 2.7 mg/dL (ref 2.5–4.6)

## 2019-04-09 LAB — PROTIME-INR
INR: 1.4 — ABNORMAL HIGH (ref 0.8–1.2)
Prothrombin Time: 16.7 seconds — ABNORMAL HIGH (ref 11.4–15.2)

## 2019-04-09 LAB — LACTIC ACID, PLASMA: Lactic Acid, Venous: 2.8 mmol/L (ref 0.5–1.9)

## 2019-04-09 LAB — HEPARIN LEVEL (UNFRACTIONATED): Heparin Unfractionated: 0.35 IU/mL (ref 0.30–0.70)

## 2019-04-09 LAB — GLUCOSE, CAPILLARY: Glucose-Capillary: 151 mg/dL — ABNORMAL HIGH (ref 70–99)

## 2019-04-09 LAB — PROCALCITONIN: Procalcitonin: 1.44 ng/mL

## 2019-04-09 MED ORDER — WARFARIN SODIUM 5 MG PO TABS
5.0000 mg | ORAL_TABLET | Freq: Once | ORAL | Status: DC
  Filled 2019-04-09: qty 1

## 2019-04-09 MED ORDER — SODIUM CHLORIDE 0.9 % IV BOLUS
1000.0000 mL | Freq: Once | INTRAVENOUS | Status: AC
  Administered 2019-04-09: 1000 mL via INTRAVENOUS

## 2019-04-09 MED ORDER — SODIUM CHLORIDE 0.9 % IV SOLN: INTRAVENOUS | Status: DC

## 2019-04-09 MED ORDER — SODIUM CHLORIDE 0.9 % IV SOLN
2.0000 g | Freq: Three times a day (TID) | INTRAVENOUS | Status: DC
  Administered 2019-04-09 – 2019-04-14 (×13): 2 g via INTRAVENOUS
  Filled 2019-04-09 (×17): qty 2

## 2019-04-09 MED ORDER — VANCOMYCIN HCL 1500 MG/300ML IV SOLN
1500.0000 mg | INTRAVENOUS | Status: DC
  Administered 2019-04-09: 21:00:00 1500 mg via INTRAVENOUS
  Filled 2019-04-09: qty 300

## 2019-04-09 NOTE — Progress Notes (Addendum)
ANTICOAGULATION CONSULT NOTE - Initial Consult  Pharmacy Consult for heparin and warfarin Indication: atrial fibrillation  Allergies  Allergen Reactions  . Aspirin Hives and Swelling    Angioedema   . Rofecoxib Hives  . Hydrocodone Hives    Tolerates oxycodone and tramadol    Patient Measurements: Height: 5\' 7"  (170.2 cm) Weight: 192 lb 14.4 oz (87.5 kg) IBW/kg (Calculated) : 61.6 Heparin Dosing Weight: 84.5  Vital Signs: Temp: 100.1 F (37.8 C) (01/03 0752) Temp Source: Oral (01/03 0752) BP: 140/68 (01/03 0752) Pulse Rate: 60 (01/03 0752)  Labs: Recent Labs    04/07/19 0305 04/08/19 0254 04/08/19 1238 04/08/19 2044 04/09/19 0406  HGB 8.3* 8.2*  --   --  8.5*  HCT 25.5* 25.5*  --   --  26.8*  PLT 161 159  --   --  124*  LABPROT  --   --  15.9*  --  16.7*  INR  --   --  1.3*  --  1.4*  HEPARINUNFRC  --   --   --  0.36 0.35  CREATININE 0.85 0.70  --   --  0.70    Estimated Creatinine Clearance: 64.8 mL/min (by C-G formula based on SCr of 0.7 mg/dL).   Medical History: Past Medical History:  Diagnosis Date  . AKI (acute kidney injury) ([place]) 04/2016  . Anemia   . Arthritis   . Chronic atrial fibrillation ([employer])   . Chronic edema   . Chronic venous insufficiency   . CKD (chronic kidney disease), stage III   . Coagulopathy ([place])   . Diabetes mellitus    Type 2  . Diverticulosis   . Dyslipidemia   . GERD (gastroesophageal reflux disease)   . GI bleed 2015   a. lower GI from diverticular source 2015.  [name] Hx of transfusion of packed red blood cells   . Hypertension   . Morbid obesity ([place])   . Obstructive sleep apnea    on CPAP  . Pulmonary hypertension ([place])   . Pyelonephritis 04/2016  . Renal infarct ([place])    a. 04/2016- adm with flank pain, ? pyelo vs renal infarct in setting of recent subtherapeutic INR.  [name] Sinus bradycardia   . Stroke [facility]) 2015    Medications:  Scheduled:  . sodium chloride   Intravenous Once  . amiodarone  200 mg Oral Daily   . Chlorhexidine Gluconate Cloth  6 each Topical Daily  . diltiazem  120 mg Oral Daily  . docusate  100 mg Oral BID  . feeding supplement (ENSURE ENLIVE)  237 mL Oral TID BM  . ferrous sulfate  300 mg Oral BID WC  . gabapentin  300 mg Oral Q12H  . metoprolol tartrate  50 mg Oral BID  . multivitamin with minerals  1 tablet Oral Daily  . pantoprazole  40 mg Oral Daily  . pravastatin  20 mg Oral q1800  . senna-docusate  2 tablet Oral BID  . sodium chloride flush  10-40 mL Intracatheter Q12H  . sodium chloride flush  3 mL Intravenous Once  . Warfarin - Pharmacist Dosing Inpatient   Does not apply q1800   Infusions:  . cefTRIAXone (ROCEPHIN)  IV 2 g (04/09/19 0434)  . heparin 1,250 Units/hr (04/09/19 2706)    Assessment: Pt is a 79 y/o female with a PMH of a.fib, HTN, CKD, and stroke. She has a R TKA in [place] in 2018, but had to be recently admitted from 10/30 - 12/24 for dislocation  of R knee prosthesis with septic joint. Pt had a R AKA by Dr. Lajoyce Corners as definitive treatment but treatment was complicated by wound infection and hemorrhagic shock that required an ICU stay and PRBCs. She is not POD 2 from right hip disarticulation. Warfarin has been on hold with last dose on 12/13. Pharmacy has been consulted to resume anticoagulation with heparin and warfarin.  Am heparin level is therapeutic at 0.35 on heparin rate of 1,250 units/hr. INR has increased from 1.3 to 1.4 after warfarin dose of 5 mg. Hg/Hct are low but stable at 8.5/26.8. No bleeding or issues with heparin infusion noted. Patient's CHA2DS2VASc score is high at 7. Of note, patient is on amiodarone but the interaction should be stable as this was a PTA medication.   Goal of Therapy:  INR 2-3 Heparin level 0.3-0.7 units/ml Monitor platelets by anticoagulation protocol: Yes   Plan:  Continue heparin infusion at 1250 units/hr Check anti-Xa level daily while on heparin Continue to monitor H&H and platelets  Give warfarin 5 mg x 1  tonight Check INR daily Watch for signs/symptoms of bleeding  Alvia Grove, PharmD PGY1 Acute Care Pharmacy Resident 04/09/2019,9:18 AM

## 2019-04-09 NOTE — Progress Notes (Signed)
Pt had critical Lactic acid of 2.8, lab was drawn before second liter of NS was given. X [name] paged to notify will repeat  Lactic acid in 3 hours. Pt is now responding and talking and is back to her baseline of alert to self

## 2019-04-09 NOTE — Progress Notes (Signed)
Pt right hip has foul smelling odor, Dr Marland Mcalpine  At the bedside pt is responding to pain only, new orders given

## 2019-04-09 NOTE — Progress Notes (Signed)
RN and tech entered pt's room to give evening meds. Pt lethargic and not responding when spoken to. Vitals obtained. BP 108/54. O2 sat 99% on room air. RR 20-24. Pt's temp 102.8. PRN tylenol given rectally. Glucose 151. HR ranging from 60-120s. HR will spike to 120s, then will go back down to 60. Pt's groin and vaginal area appear to be swollen. Tissue is soft. Wound vac output has been 625 since 7am. Paged attending MD with concerns. Orders placed for CT scans of head/abdomen and labs. MD not available to come assess pt at this time. Rapid response RN was called and arrived to bedside to assess pt. On-call ortho MD notified as well. Advised by ortho MD to wait for labs and CT to be reviewed, then would go from there.

## 2019-04-09 NOTE — Progress Notes (Addendum)
PROGRESS NOTE    Robin Snow  DJS:[id number] DOB: 15-Nov-1940 DOA: 03/31/2019 PCP: [name], MD   Brief Narrative:  Robin Snow a 79 y.o.femalewith medical history significant ofa.fib, HTN, CKD, stroke. Patient had R TKA in [place] in 2019. Patient recently admitted from 10/30-12/23 for dislocation of R knee prosthesis with septic joint. Closed reduction by Dr. Doreatha Martin 11/18. 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 Ultimately underwent R AKA by [name]. This complicated by sepsis from E.Coli bacteremia, spent several days in ICU on vent. Also complicated by AKA wound dehissance.Patient finally discharged 12/23 to [place] [facility] on rocephin to complete 6 weeks of therapy with end date of 1/6.   For a full recounting of the prolonged, nearly 2 month hospital stay, please see Dr. Lisbeth Ply discharge summary on 12/23.  Per SNF: patient didn't get rocephin on (12/24). At around 7pm they noticed asmall amount ofbloodydrainage from dressingand noticedfoulodor from wound. At 745pm noticed dressing was saturated and at 9pm now oozing blood around dressing. Patient was noted to be running fever of 100.7 at facility. Facility confirms that coumadin is still on hold as per DC summary plan.  ED Course:No fever in ED, lab work actually looks fairly stable compared to 2 days ago.  CT of RLE femur is worrisome for hematoma with probable superimposed infection extending from amputation site.  She was admitted under hospital service with Ortho to consult.  She was given Rocephin and vancomycin in ED.  She is scheduled to have right hip disarticulation with [name].  She underwent hip disarticulation in the OR on 04/05/19 and postoperatively she had some hemorrhagic shock and became hypotensive received 3 units of albumin Neo-Synephrine as well as 3 units of PRBCs and was transferred to the intensive care unit for further  monitoring.  She is now hemodynamically stable and improved and has been transferred to PCU.  She is much more awake and alert and does have some anemia which we will continue to monitor and is stabilized.  PT OT recommending skilled nursing facility.  I spoke with orthopedics [name] who recommends resuming her home anticoagulation and she will be started on a heparin drip with a Coumadin bridge given her elevated CHA2DS2-VASc score of 7.  Need to closely monitor her anticoagulation as her platelet count dropped a little bit but her hemoglobin has remained stable.  Social work is in the process of assisting with placement back to [facility].  We will repeat her Covid test in the interim as she may be discharged the next 4 to 48 hours stable  Assessment & Plan:   Principal Problem:   Wound dehiscence Active Problems:   Essential hypertension   AF (paroxysmal atrial fibrillation) (HCC)   Bacteremia due to Escherichia coli   S/P AKA (above knee amputation), right (HCC)   Cellulitis of right lower extremity   Dehiscence of amputation stump (HCC)   Wound dehiscence/infection of amputation site/right stump status post hip disarticulation postoperative day 4 -Wound care on board.  Per Dr. Jess Barters consultation note, patient scheduled to have right hip disarticulation and this was done 04/05/20 and postoperatively she was transferred to the ICU for post hemorrhagic shock this is now improved and will be transferred to the progressive care unit -Has a wound VAC in place -S/p 3 Units of pRBC's this hospitalization so far -PCCM has been consulted given her hemorrhagic shock but she is hemodynamically stable -Further care per orthopedic  surgery -Currently on IV Dilaudid 0.5 mg every 4 as needed for severe pain, p.o. oxycodone IR and oxycodone acetaminophen 1 tab p.o. twice daily as needed -Bowel regimen in place -PCCM was consulted for hemorrhagic shock but she is stable and she is being transferred to  the stepdown unit now -Per orthopedic surgery the patient had extensive necrotic muscles including muscles originating in the pelvis and tissue were sent for cultures and patient will need long-term antibiotics and she is currently on Rocephin 2 g IV daily and may need further adjustments based on her cultures and sensitivities; surgical pathology results showed a gangrenous necrosis of skin and soft tissue with atheroma formation with a luminal stenosis and dystrophic calcifications of vasculature and it stated that the gangrenous necrosis is extensive soft tissue resection margin; I do not see the sensitivities in EPIC -WBC went from 9.7 -> 13.4 -> 15.3 -> 14.2 -> 16.9 -> **21.2 and is trending upwards so we will need to continue monitor very carefully and if continues to worsen may need to panculture and discuss with ID about broadening antibiotics if infection is suspected -Patient's VTE prophylaxis now resumed and I spoke with pharmacy and [name] and he is okay with her going back on full dose anticoagulation with Coumadin and will bridge with heparin -Currently no signs and symptoms of bleeding but did have a platelet count drop  **ADDENDUM 17:30: Patient states that the patient is a little bit more somnolent and not like herself.  Will order stat head CT scan.  Nursing states that the wound VAC is also putting out quite a bit so we will also order a CT of the abdomen pelvis with contrast and obtain a fresh set of labs.  I have asked the nurse to page [name] to evaluate the wound VAC and will follow up on the CT of the Abdomen and Pelvis.  **ADDENDUM 18:47: Patient's labs showed worsening WBC and patient became febrile and she is tachypneic and tachycardic and septic appearing.  Rapid response was called and rapid response nurse came to see the patient.  We will order a chest x-ray as well as a urinalysis and urine culture.  Will give 2 L normal saline boluses and start on maintenance IV fluids  with normal saline at 75 mL's per hour.  Patient was seen and examined again this evening and she does not respond like she did this morning and she does grimace to pain but is very lethargic and somnolent.  Will hold any further narcotic medications and heparin drip and Coumadin have been held.  Blood count did drop slightly hemoglobin is now 7.7.  Lower abdomen is more distended so will await CT scan.  Her lower abdomen has a very pungent odor to it and we will broaden her antibiotics and await for surgical evaluation.  In the interim will call ID for further antibiotic duration given that she does have a E. coli bacteremia and endocarditis.  Spoke with Dr. Linus Salmons who recommends continuing vancomycin and cefepime for now and repeat an echocardiogram as well as blood cultures and urine culture and urinalysis as well which is already been done.  Need to continue to monitor patient very carefully  Acute Blood Loss Anemia from Surgical Intervention -Patient's hemoglobin/hematocrit went from 9.5/30.6 and dropped down to 7.1/21.01 and was transfused 3 units of PRBCs -BP after transfusion was 10.6/31.7 and now started dropping again and went to 9.5/28.0 -> 8.3/25.5 -> 8.2/25.5 -> 8.5/26.8 this AM and now  dropping and is **7.7/24.0 -Continue to monitor for signs and symptoms of bleeding at time of expected postoperative drop given the complexity of her surgery -Continue with iron supplementation ferrous sulfate 300 mg/5 mL p.o. twice daily -Continue to monitor for signs and symptoms of bleeding; currently no overt bleeding noted -Has a wound VAC in place and had approximately 800 cc of drainage from surgery -Coumadin was still on Hold but per my discussion with [name] he states that it is okay to resume anticoagulation with Coumadin and will bridge her with a heparin drip given her elevated chads vas score  E. coli Bacteremia/Endocarditis:  -Continue Rocephin 2 g IV daily until 04/11/2018 per ID  recommendations from recent hospitalization. -Follow WBC and may need to discuss with ID again if continuing to worsen **-ECHOCardiogram to be Repeated by ID Reccs  Permanent Atrial Fibrillation was in sinus tachycardia this morning -Rates fluctuating but mostly controlled without symptoms and they were ranging in the 120s this morning.   Resume home dose of metoprolol, Cardizem and amiodarone  -Coumadin was on hold due to surgical intervention  -Will resume anticoagulation now that she is cleared by orthopedic surgery and will bridge with a heparin drip given her elevated CHA2DS2-VASc of 7 -We will need to monitor very carefully about signs and symptoms of bleeding -Her magnesium and her potassium are normal  Essential Hypertension: -Controlled. -Initially held diltiazem and metoprolol she becomes hypotensive but resumed today -Went into post hemorrhagic shock and had to be placed on Neo-Synephrine in the OR and given albumin and 3 units of PRBCs -Pressures have improved and was 140/68 -Resume antihypertensives  Obesity Severe malnutrition in the context of acute illness/injury -Estimated body mass index is 30.21 kg/m as calculated from the following:   Height as of this encounter: '5\' 7"'$  (1.702 m).   Weight as of this encounter: 87.5 kg. -Weight Loss and Dietary Counseling given  -Patient is consulted for further evaluation recommendations and recommending Ensure Enlive p.o. 3 times daily along with Magic cup 3 times daily with meals and multivitamin with minerals daily as well as liberalizing diet to help improve p.o. intake  Hyperlipidemia -Continue Pravastatin 20 g p.o. nightly  GERD  -Continue Pantoprazole 40 mg p.o. daily  Hypomagnesemia -Patient's Mag Level was 2.2 this AM  -Continue to Monitor and Replete as Necessary -Repeat Mag Level in the AM  Hyperbilirubinemia, improved  -Patient's T Bili was 2.2 and improved to 0.9 and not repeated today  -Likely  Reactive -Continue to Monitor and Trend -Repeat CMP in AM   Leukocytosis, worsening  -Likely reactive in the setting of Surgical intervention and Hip Disarticulation -Patients WBC went from  9.7 -> 13.4 -> 15.3 -> 14.3 -> 16.9 -> *21.2 -Continue to Monitor for S/Sx of Infection -Repeat CBC in AM   Thrombocytopenia  -Patient's Platelet Count dropped from 159 -> 124 -> 179 -Continue to Monitor for S/Sx of Bleeding; Currently no overt bleeding noted -Repeat CBC in AM   Somnolence and Lethargy -Appears Septic and ? Infectious so will pan Cx and Broaden Abx coverage  -Check Head CT given that she was on Heparin gtt -  DVT prophylaxis: SCDs; Now on Heparin 5,000 units sq q8h has been discontinued. Will be resuming Coumadin now that it is okay with orthopedic surgery and will bridge with Heparin gtt Code Status: FULL CODE  Family Communication: No family present at bedside  Disposition Plan: SNF When bed available  Consultants:  Orthopedic Surgery  PCCM/Pulmonary  **  Infectious Diseases; I spoke with Dr. Linus Salmons    Procedures:  HIP DISARTICULATION done by [name]    Antimicrobials:  Anti-infectives (From admission, onward)   Start     Dose/Rate Route Frequency Ordered Stop   04/05/19 1400  ceFAZolin (ANCEF) IVPB 2g/100 mL premix     2 g 200 mL/hr over 30 Minutes Intravenous On call to O.R. 04/05/19 0723 04/05/19 1100   04/01/19 0500  cefTRIAXone (ROCEPHIN) IVPB  Status:  Discontinued    Note to Pharmacy: Indication:  E Coli Endocarditis Last Day of Therapy:  04/12/2019 Labs - Once weekly:  CBC/D and BMP, Labs - Every other week:  ESR and CRP     2 g Intravenous Every 24 hours 03/31/19 0436 03/31/19 0500   04/01/19 0400  cefTRIAXone (ROCEPHIN) 2 g in sodium chloride 0.9 % 100 mL IVPB     2 g 200 mL/hr over 30 Minutes Intravenous Every 24 hours 03/31/19 0501 04/13/19 0359   03/31/19 0400  vancomycin (VANCOCIN) IVPB 1000 mg/200 mL premix     1,000 mg 200 mL/hr over 60 Minutes  Intravenous  Once 03/31/19 0356 03/31/19 0606   03/31/19 0100  cefTRIAXone (ROCEPHIN) 2 g in sodium chloride 0.9 % 100 mL IVPB     2 g 200 mL/hr over 30 Minutes Intravenous  Once 03/31/19 0100 03/31/19 0329     Subjective: Seen and examined at bedside and she was a little bit somnolent and wanting to sleep.  Nursing states that she has been wanting to sleep throughout the day and states that she did not get regular sleep last night.  Nursing states that she does not want to get out of bed or do much of anything.  Patient not complaining of any pain was wanting something to drink with some ice water.  No chest pain, lightheadedness or dizziness.  No other concerns or complaints this time.  Objective: Vitals:   04/08/19 2100 04/09/19 0011 04/09/19 0435 04/09/19 0752  BP: 123/60 116/61 130/69 140/68  Pulse: 60 60  60  Resp: '17 17  18  '$ Temp: 98.6 F (37 C) 98.4 F (36.9 C) 98.9 F (37.2 C) 100.1 F (37.8 C)  TempSrc: Oral Oral Oral Oral  SpO2: 98% 98%  99%  Weight:      Height:        Intake/Output Summary (Last 24 hours) at 04/09/2019 0831 Last data filed at 04/09/2019 0700 Gross per 24 hour  Intake 264.2 ml  Output 1550 ml  Net -1285.8 ml   Filed Weights   04/01/19 1023 04/05/19 1705  Weight: 102 kg 87.5 kg   Examination: Physical Exam:  Constitutional: WN/WD obese AAF in NAD and appears calm and was again somnolent and wanting to sleep Eyes: Lids and conjunctivae normal ENMT: External Ears, Nose appear normal. Grossly normal hearing. Mucous membranes are moist.  Neck: Appears normal, supple, no cervical masses, normal ROM, no appreciable thyromegaly; no JVD Respiratory: Diminished to auscultation bilaterally, no wheezing, rales, rhonchi or crackles. Normal respiratory effort and patient is not tachypenic. No accessory muscle use. Unlabored breathing  Cardiovascular: Has a paced rhythm, no murmurs / rubs / gallops. S1 and S2 auscultated. Trace lower Left extremity  edema. Abdomen: Soft, non-tender, distended secondary to body habitus. Bowel sounds positive.  GU: Deferred. Musculoskeletal: Has a Right Hip Disarticulation with a wound Vac in Place on the Right side of his Abdomen Skin: No rashes, lesions, ulcers on a limited skin evaluation. No induration; Warm and dry.  Neurologic: CN 2-12 grossly intact with no focal deficits. Romberg sign and cerebellar reflexes not assessed.  Psychiatric: Normal judgment and insight.  Mains a little bit sleepy today but is easily arousable and asking for water.  Denies any complaints of any pain currently  Data Reviewed: I have personally reviewed following labs and imaging studies  CBC: Recent Labs  Lab 04/04/19 0422 04/05/19 0448 04/05/19 2152 04/06/19 0525 04/07/19 0305 04/08/19 0652 04/09/19 0406  WBC 6.3 9.7  --  13.4* 15.3* 14.2* 16.9*  NEUTROABS 3.0 5.7  --  9.8* 10.8* 12.5*  --   HGB 9.2* 9.5* 10.6* 9.5* 8.3* 8.2* 8.5*  HCT 29.0* 30.6* 31.7* 28.0* 25.5* 25.5* 26.8*  MCV 95.1 95.3  --  91.8 95.1 95.1 96.8  PLT 251 221  --  173 161 159 466*   Basic Metabolic Panel: Recent Labs  Lab 04/03/19 0922 04/05/19 0448 04/05/19 1504 04/06/19 0525 04/07/19 0305 04/08/19 0652 04/09/19 0406  NA  --  139 141 140 139 140 140  K  --  3.7 4.1 4.0 3.8 4.2 4.5  CL  --  103  --  106 105 104 106  CO2  --  26  --  '27 27 24 26  '$ GLUCOSE  --  108*  --  138* 99 89 126*  BUN  --  13  --  '15 16 15 15  '$ CREATININE  --  0.90  --  0.83 0.85 0.70 0.70  CALCIUM  --  8.4*  --  8.6* 8.3* 8.3* 8.2*  MG 1.7  --   --  1.5* 2.2 1.8 2.2  PHOS  --   --   --  3.5 2.9 3.0  --    GFR: Estimated Creatinine Clearance: 64.8 mL/min (by C-G formula based on SCr of 0.7 mg/dL). Liver Function Tests: Recent Labs  Lab 04/06/19 0525 04/07/19 0305 04/08/19 0652  AST 13* 14* 23  ALT '12 9 13  '$ ALKPHOS 81 83 92  BILITOT 2.2* 0.9 0.9  PROT 4.8* 4.8* 5.1*  ALBUMIN 2.2* 1.9* 1.8*   No results for input(s): LIPASE, AMYLASE in the last  168 hours. No results for input(s): AMMONIA in the last 168 hours. Coagulation Profile: Recent Labs  Lab 04/08/19 1238 04/09/19 0406  INR 1.3* 1.4*   Cardiac Enzymes: No results for input(s): CKTOTAL, CKMB, CKMBINDEX, TROPONINI in the last 168 hours. BNP (last 3 results) No results for input(s): PROBNP in the last 8760 hours. HbA1C: No results for input(s): HGBA1C in the last 72 hours. CBG: Recent Labs  Lab 04/05/19 2311 04/06/19 0308 04/06/19 0727 04/06/19 1134 04/06/19 1506  GLUCAP 141* 136* 122* 128* 149*   Lipid Profile: No results for input(s): CHOL, HDL, LDLCALC, TRIG, CHOLHDL, LDLDIRECT in the last 72 hours. Thyroid Function Tests: No results for input(s): TSH, T4TOTAL, FREET4, T3FREE, THYROIDAB in the last 72 hours. Anemia Panel: No results for input(s): VITAMINB12, FOLATE, FERRITIN, TIBC, IRON, RETICCTPCT in the last 72 hours. Sepsis Labs: No results for input(s): PROCALCITON, LATICACIDVEN in the last 168 hours.  Recent Results (from the past 240 hour(s))  Blood culture (routine x 2)     Status: None   Collection Time: 03/31/19 12:44 AM   Specimen: BLOOD LEFT HAND  Result Value Ref Range Status   Specimen Description BLOOD LEFT HAND  Final   Special Requests   Final    BOTTLES DRAWN AEROBIC ONLY Blood Culture results may not be optimal due to an inadequate volume of blood received in  culture bottles   Culture   Final    NO GROWTH 5 DAYS Performed at [facility], [place] [address], [place], [place] [address]    Report Status 04/05/2019 FINAL  Final  Blood culture (routine x 2)     Status: None   Collection Time: 03/31/19 12:45 AM   Specimen: BLOOD  Result Value Ref Range Status   Specimen Description BLOOD LEFT ARM  Final   Special Requests   Final    BOTTLES DRAWN AEROBIC ONLY Blood Culture adequate volume   Culture   Final    NO GROWTH 5 DAYS Performed at [facility], [address], [place], [place] [address]    Report Status 04/05/2019  FINAL  Final  SARS CORONAVIRUS 2 (TAT 6-24 HRS) Nasopharyngeal Nasopharyngeal Swab     Status: None   Collection Time: 03/31/19  2:26 AM   Specimen: Nasopharyngeal Swab  Result Value Ref Range Status   SARS Coronavirus 2 NEGATIVE NEGATIVE Final    Comment: (NOTE) SARS-CoV-2 target nucleic acids are NOT DETECTED. The SARS-CoV-2 RNA is generally detectable in upper and lower respiratory specimens during the acute phase of infection. Negative results do not preclude SARS-CoV-2 infection, do not rule out co-infections with other pathogens, and should not be used as the sole basis for treatment or other patient management decisions. Negative results must be combined with clinical observations, patient history, and epidemiological information. The expected result is Negative. Fact Sheet for Patients: SugarRoll.be Fact Sheet for Healthcare Providers: https://www.woods-mathews.com/ This test is not yet approved or cleared by the [place] FDA and  has been authorized for detection and/or diagnosis of SARS-CoV-2 by FDA under an Emergency Use Authorization (EUA). This EUA will remain  in effect (meaning this test can be used) for the duration of the COVID-19 declaration under Section 56 4(b)(1) of the Act, 21 U.S.C. section 360bbb-3(b)(1), unless the authorization is terminated or revoked sooner. Performed at [facility], [place] [address], [place], [place] [address]   Urine culture     Status: Abnormal   Collection Time: 03/31/19  2:43 AM   Specimen: Urine, Random  Result Value Ref Range Status   Specimen Description URINE, RANDOM  Final   Special Requests NONE  Final   Culture (A)  Final    <10,000 COLONIES/mL INSIGNIFICANT GROWTH Performed at [facility], [place] [address], [place], [place] [address]    Report Status 04/01/2019 FINAL  Final  Surgical pcr screen     Status: None   Collection Time: 04/04/19 11:13 PM   Specimen:  Nasal Mucosa; Nasal Swab  Result Value Ref Range Status   MRSA, PCR NEGATIVE NEGATIVE Final   Staphylococcus aureus NEGATIVE NEGATIVE Final    Comment: (NOTE) The Xpert SA Assay (FDA approved for NASAL specimens in patients 22 years of age and older), is one component of a comprehensive surveillance program. It is not intended to diagnose infection nor to guide or monitor treatment. Performed at [facility], [place] [address] [place], [place] [address]     RN Pressure Injury Documentation: Pressure Injury 03/01/19 Buttocks Medial Stage II -  Partial thickness loss of dermis presenting as a shallow open ulcer with a red, pink wound bed without slough. (Active)  03/01/19 1732  Location: Buttocks  Location Orientation: Medial  Staging: Stage II -  Partial thickness loss of dermis presenting as a shallow open ulcer with a red, pink wound bed without slough.  Wound  Description (Comments):   Present on Admission: Yes   Radiology Studies: No results found.  Scheduled Meds: . sodium chloride   Intravenous Once  . amiodarone  200 mg Oral Daily  . Chlorhexidine Gluconate Cloth  6 each Topical Daily  . diltiazem  120 mg Oral Daily  . docusate  100 mg Oral BID  . feeding supplement (ENSURE ENLIVE)  237 mL Oral TID BM  . ferrous sulfate  300 mg Oral BID WC  . gabapentin  300 mg Oral Q12H  . metoprolol tartrate  50 mg Oral BID  . multivitamin with minerals  1 tablet Oral Daily  . pantoprazole  40 mg Oral Daily  . pravastatin  20 mg Oral q1800  . senna-docusate  2 tablet Oral BID  . sodium chloride flush  10-40 mL Intracatheter Q12H  . sodium chloride flush  3 mL Intravenous Once  . Warfarin - Pharmacist Dosing Inpatient   Does not apply q1800   Continuous Infusions: . cefTRIAXone (ROCEPHIN)  IV 2 g (04/09/19 0434)  . heparin 1,250 Units/hr (04/09/19 0723)    LOS: 9 days    Kerney Elbe, DO [facility] PAGER is on [place]  If 7PM-7AM, please contact  night-coverage [email or web address]

## 2019-04-09 NOTE — TOC Initial Note (Signed)
Transition of Care [facility]) - Initial/Assessment Note    Patient Details  Name: Robin Snow MRN: [id number] Date of Birth: [birth year]  Transition of Care [facility]) CM/SW Contact:    Patrice Paradise, LCSW Phone Number: [phone] 04/09/2019, 11:50 AM  Clinical Narrative:                  CSW spoke with patient's daughter [name] due to patient's cognition about patients discharge plan. CSW reviewed that PT has recommended that [facility] has [facility] and was the plan for patient to return to [facility]. [name] stated that the plan was for patient to return to [facility]. CSW informed [name] that CSW would follow up with [facility] to make them aware of request.  CSW called Olegario Messier at [facility] and had to leave a message.  TOC will continue to follow for discharge planning needs. Expected Discharge Plan: Skilled Nursing Facility Barriers to Discharge: Continued Medical Work up   Patient Goals and CMS Choice        Expected Discharge Plan and Services Expected Discharge Plan: Skilled Nursing Facility       Living arrangements for the past 2 months: Skilled Nursing Facility                                      Prior Living Arrangements/Services Living arrangements for the past 2 months: Skilled Nursing Facility Lives with:: Facility Resident                   Activities of Daily Living Home Assistive Devices/Equipment: Hospital bed ADL Screening (condition at time of admission) Patient's cognitive ability adequate to safely complete daily activities?: Yes Is the patient deaf or have difficulty hearing?: No Does the patient have difficulty seeing, even when wearing glasses/contacts?: No Does the patient have difficulty concentrating, remembering, or making decisions?: Yes Patient able to express need for assistance with ADLs?: Yes Does the patient have difficulty dressing or bathing?: Yes Independently performs ADLs?: No Communication: Independent Dressing (OT):  Dependent Is this a change from baseline?: Pre-admission baseline Grooming: Dependent Is this a change from baseline?: Pre-admission baseline Feeding: Needs assistance Is this a change from baseline?: Pre-admission baseline Bathing: Dependent Is this a change from baseline?: Pre-admission baseline Toileting: Dependent Is this a change from baseline?: Pre-admission baseline In/Out Bed: Dependent Is this a change from baseline?: Pre-admission baseline Walks in Home: Dependent Is this a change from baseline?: Pre-admission baseline Does the patient have difficulty walking or climbing stairs?: Yes Weakness of Legs: Both Weakness of Arms/Hands: Both  Permission Sought/Granted      Share Information with NAME: [name]  Permission granted to share info w AGENCY: [facility]  Permission granted to share info w Relationship: Daughter  Permission granted to share info w Contact Information: [phone]  Emotional Assessment   Attitude/Demeanor/Rapport: Unable to Assess Affect (typically observed): Unable to Assess Orientation: : Oriented to Self      Admission diagnosis:  Anasarca [R60.1] Acute pulmonary edema (HCC) [J81.0] Pain [R52] Fever [R50.9] Wound dehiscence [T81.30XA] Bilateral pleural effusion [J90] Cellulitis of right lower extremity [L03.115] Patient Active Problem List   Diagnosis Date Noted  . Dehiscence of amputation stump (HCC)   . Cellulitis of right lower extremity   . S/P AKA (above knee amputation), right (HCC) 03/31/2019  . Wound dehiscence 03/31/2019  . Acute respiratory failure (HCC)   .  Pressure injury of skin 03/01/2019  . Dislocation of right knee   . Osteomyelitis of right knee region [facility])   . Severe protein-calorie malnutrition ([place])   . Abscess of thigh   . Chronic osteomyelitis of right femur with draining sinus ([place])   . Infection of total right knee replacement ([place])   . Bacteremia due to Escherichia coli 02/23/2019  . Iron  deficiency anemia due to chronic blood loss 02/23/2019  . Closed dislocation of right knee 02/22/2019  . Effusion of right knee joint 02/22/2019  . AF (paroxysmal atrial fibrillation) ([place]) 02/22/2019  . Atrial fibrillation with RVR ([place]) 02/03/2019  . Acute cystitis with hematuria   . Sepsis ([place]) 02/02/2019  . Foot pain 07/19/2018  . Warm antibody hemolytic anemia 11/24/2017  . Pre-op evaluation 09/24/2017  . Urinary frequency 09/06/2017  . Obstructive sleep apnea   . Long term (current) use of anticoagulants 12/25/2016  . Orthostatic hypotension 09/08/2016  . Supratherapeutic INR 09/08/2016  . Chronic diastolic CHF (congestive heart failure) ([place]) 09/08/2016  . Sinus pause: 4.02-4.80sec per telemetry 09/08/2016 09/08/2016  . Tachy-brady syndrome ([place]) 09/08/2016  . Sinus bradycardia 07/08/2016  . CKD (chronic kidney disease) stage 3, GFR 30-59 ml/min 05/11/2016  . Anemia 05/01/2016  . Diabetes mellitus with complication ([place])   . Physical deconditioning 12/14/2014  . Essential hypertension 05/18/2014  . HLD (hyperlipidemia) 05/18/2014  . Chronic venous insufficiency 05/11/2014  . Embolic stroke ([place]) 91/47/8295  . Right hemiparesis ([place]) 01/10/2014  . GERD (gastroesophageal reflux disease)   . Chronic pain syndrome 09/07/2013  . Painful total knee replacement ([place]) 08/01/2013  . Permanent atrial fibrillation ([place]) 05/22/2010   PCP:  [name], MD Pharmacy:  No Pharmacies Listed    Social Determinants of Health (SDOH) Interventions    Readmission Risk Interventions No flowsheet data found.

## 2019-04-09 NOTE — Progress Notes (Signed)
Heparin drip and coumadin discontinued per verbal order from Lajoyce Corners, MD. Lajoyce Corners will assess pt tomorrow morning.

## 2019-04-09 NOTE — Progress Notes (Signed)
Pharmacy Antibiotic Note  Robin Snow is a 79 y.o. female with concern of  sepsis.  Pharmacy has been consulted for cefepime and vancomycin dosing. -WBC= 21.1, tmax= 102.8 -SCr= 0.7  Plan: -vancomycin 1500mg  IV q24hr (estimated AUC= 474) -cefepime 2gm IV q8h -Discontinue rocephin -Will follow renal function, cultures and clinical progress    Height: 5\' 7"  (170.2 cm) Weight: 192 lb 14.4 oz (87.5 kg) IBW/kg (Calculated) : 61.6  Temp (24hrs), Avg:99.6 F (37.6 C), Min:98.4 F (36.9 C), Max:102.8 F (39.3 C)  Recent Labs  Lab 04/06/19 0525 04/07/19 0305 04/08/19 0652 04/09/19 0406 04/09/19 1757  WBC 13.4* 15.3* 14.2* 16.9* 21.2*  CREATININE 0.83 0.85 0.70 0.70 0.69    Estimated Creatinine Clearance: 64.8 mL/min (by C-G formula based on SCr of 0.69 mg/dL).    Allergies  Allergen Reactions  . Aspirin Hives and Swelling    Angioedema   . Rofecoxib Hives  . Hydrocodone Hives    Tolerates oxycodone and tramadol    06/07/19, PharmD Clinical Pharmacist **Pharmacist phone directory can now be found on [email or web address] ([email or web address]).  Listed under [facility].

## 2019-04-09 NOTE — Significant Event (Addendum)
Rapid Response Event Note  Overview: AMS, Increased Swelling at Surgical Site  Initial Focused Assessment: Per nurse, patient has been hard to arouse the last hour. Normally patient is more interactive but currently per nurse, patient is not responding to her. Also the nurse noticed significant increased swelling around the RT disarticulation site and around her perineum. Patient has wound vac to the site - it is intact - total output today 600cc - blood tinged drainage. Patient was very hot to touch, oral temperature was 102.8, HR normally paced but has had periods of tachycardiac - AF. Stable BP and stable respiratory status, not in acute distress, lung sounds - clear throughout, abdomen - obese but soft. +2 edema in BUE and LLE. Patient would only grimace to painful stimuli and patient has not received narcotic pain medications today, had some tramadol earlier for pain. Per nurse, MD ordered CT HEAD (R), CT A/P, and STAT labs. RN paged both TRH MD and ORTHO MD. Pupils were not assessed because the patient would not comply to opening her eyes. + GAG. Foul odor > I am not sure if this is from the surgical site. Blood sugar normal   Interventions: -- APAP PR -- External Cooling with Ice Packs -- STAT Labs obtained   Plan of Care: -- RN to take patient for CT scans -- currently CT is full with emergencies - I called at 1835 -- Recheck temperature - Rectally - in an hour -- Monitor VS and mental status  -- Bladder scan patient -- given swelling in the perineum -- it could just dependent edema from being in be but want to make sure she is not retaining any urine. -- Follow Up with labs -- WBC 21.2 and H/H 7.7/24 -- Might need an infectious work up - UA/UC, BCx, CXR etc.   Event Summary:  Start Time 1746 Arrival Time 1753 End Time 1835  Brena Windsor R

## 2019-04-10 ENCOUNTER — Other Ambulatory Visit: Payer: Self-pay | Admitting: Physician Assistant

## 2019-04-10 ENCOUNTER — Inpatient Hospital Stay (HOSPITAL_COMMUNITY): Payer: Medicare HMO

## 2019-04-10 DIAGNOSIS — Z8619 Personal history of other infectious and parasitic diseases: Secondary | ICD-10-CM

## 2019-04-10 DIAGNOSIS — Z87891 Personal history of nicotine dependence: Secondary | ICD-10-CM

## 2019-04-10 DIAGNOSIS — I96 Gangrene, not elsewhere classified: Secondary | ICD-10-CM

## 2019-04-10 DIAGNOSIS — R509 Fever, unspecified: Secondary | ICD-10-CM | POA: Insufficient documentation

## 2019-04-10 DIAGNOSIS — Z978 Presence of other specified devices: Secondary | ICD-10-CM

## 2019-04-10 DIAGNOSIS — Z886 Allergy status to analgesic agent status: Secondary | ICD-10-CM

## 2019-04-10 DIAGNOSIS — Z885 Allergy status to narcotic agent status: Secondary | ICD-10-CM

## 2019-04-10 DIAGNOSIS — L03115 Cellulitis of right lower limb: Secondary | ICD-10-CM

## 2019-04-10 LAB — CBC WITH DIFFERENTIAL/PLATELET
Abs Immature Granulocytes: 0.4 10*3/uL — ABNORMAL HIGH (ref 0.00–0.07)
Basophils Absolute: 0 10*3/uL (ref 0.0–0.1)
Basophils Relative: 0 %
Eosinophils Absolute: 0 10*3/uL (ref 0.0–0.5)
Eosinophils Relative: 0 %
HCT: 17.9 % — ABNORMAL LOW (ref 36.0–46.0)
Hemoglobin: 5.5 g/dL — CL (ref 12.0–15.0)
Lymphocytes Relative: 9 %
Lymphs Abs: 1.8 10*3/uL (ref 0.7–4.0)
MCH: 30.9 pg (ref 26.0–34.0)
MCHC: 30.7 g/dL (ref 30.0–36.0)
MCV: 100.6 fL — ABNORMAL HIGH (ref 80.0–100.0)
Monocytes Absolute: 0.8 10*3/uL (ref 0.1–1.0)
Monocytes Relative: 4 %
Myelocytes: 2 %
Neutro Abs: 17.1 10*3/uL — ABNORMAL HIGH (ref 1.7–7.7)
Neutrophils Relative %: 85 %
Platelets: 165 10*3/uL (ref 150–400)
RBC: 1.78 MIL/uL — ABNORMAL LOW (ref 3.87–5.11)
RDW: 15.7 % — ABNORMAL HIGH (ref 11.5–15.5)
WBC: 20.1 10*3/uL — ABNORMAL HIGH (ref 4.0–10.5)
nRBC: 0 % (ref 0.0–0.2)
nRBC: 0 /100 WBC

## 2019-04-10 LAB — PHOSPHORUS: Phosphorus: 2.7 mg/dL (ref 2.5–4.6)

## 2019-04-10 LAB — URINALYSIS, ROUTINE W REFLEX MICROSCOPIC
Bilirubin Urine: NEGATIVE
Glucose, UA: NEGATIVE mg/dL
Hgb urine dipstick: NEGATIVE
Ketones, ur: NEGATIVE mg/dL
Nitrite: NEGATIVE
Protein, ur: NEGATIVE mg/dL
Specific Gravity, Urine: 1.033 — ABNORMAL HIGH (ref 1.005–1.030)
pH: 5 (ref 5.0–8.0)

## 2019-04-10 LAB — COMPREHENSIVE METABOLIC PANEL
ALT: 18 U/L (ref 0–44)
AST: 29 U/L (ref 15–41)
Albumin: 1.2 g/dL — ABNORMAL LOW (ref 3.5–5.0)
Alkaline Phosphatase: 81 U/L (ref 38–126)
Anion gap: 5 (ref 5–15)
BUN: 16 mg/dL (ref 8–23)
CO2: 26 mmol/L (ref 22–32)
Calcium: 7.3 mg/dL — ABNORMAL LOW (ref 8.9–10.3)
Chloride: 109 mmol/L (ref 98–111)
Creatinine, Ser: 0.77 mg/dL (ref 0.44–1.00)
GFR calc Af Amer: >60 mL/min (ref >60)
GFR calc non Af Amer: >60 mL/min (ref >60)
Glucose, Bld: 138 mg/dL — ABNORMAL HIGH (ref 70–99)
Potassium: 3.9 mmol/L (ref 3.5–5.1)
Sodium: 140 mmol/L (ref 135–145)
Total Bilirubin: 0.7 mg/dL (ref 0.3–1.2)
Total Protein: 3.9 g/dL — ABNORMAL LOW (ref 6.5–8.1)

## 2019-04-10 LAB — HEMOGLOBIN AND HEMATOCRIT, BLOOD
HCT: 23.3 % — ABNORMAL LOW (ref 36.0–46.0)
Hemoglobin: 7.7 g/dL — ABNORMAL LOW (ref 12.0–15.0)

## 2019-04-10 LAB — PROTIME-INR
INR: 1.6 — ABNORMAL HIGH (ref 0.8–1.2)
Prothrombin Time: 18.8 seconds — ABNORMAL HIGH (ref 11.4–15.2)

## 2019-04-10 LAB — MAGNESIUM: Magnesium: 1.7 mg/dL (ref 1.7–2.4)

## 2019-04-10 LAB — PROCALCITONIN: Procalcitonin: 3.82 ng/mL

## 2019-04-10 LAB — LACTIC ACID, PLASMA: Lactic Acid, Venous: 1.9 mmol/L (ref 0.5–1.9)

## 2019-04-10 LAB — PREPARE RBC (CROSSMATCH)

## 2019-04-10 MED ORDER — VANCOMYCIN HCL 1250 MG/250ML IV SOLN: 1250.0000 mg | INTRAVENOUS | Status: DC

## 2019-04-10 MED ORDER — SODIUM CHLORIDE 0.9 % IV BOLUS
500.0000 mL | Freq: Once | INTRAVENOUS | Status: AC
  Administered 2019-04-10: 02:00:00 500 mL via INTRAVENOUS

## 2019-04-10 MED ORDER — ACETAMINOPHEN 325 MG PO TABS
650.0000 mg | ORAL_TABLET | Freq: Once | ORAL | Status: AC
  Administered 2019-04-10: 10:00:00 650 mg via ORAL
  Filled 2019-04-10: qty 2

## 2019-04-10 MED ORDER — METOPROLOL TARTRATE 25 MG PO TABS
25.0000 mg | ORAL_TABLET | Freq: Two times a day (BID) | ORAL | Status: DC
  Administered 2019-04-10 – 2019-04-17 (×9): 25 mg via ORAL
  Filled 2019-04-10 (×13): qty 1

## 2019-04-10 MED ORDER — SODIUM CHLORIDE 0.9% IV SOLUTION: Freq: Once | INTRAVENOUS | Status: AC

## 2019-04-10 MED ORDER — GABAPENTIN 250 MG/5ML PO SOLN
300.0000 mg | Freq: Two times a day (BID) | ORAL | Status: DC
  Administered 2019-04-10 – 2019-04-17 (×10): 300 mg via ORAL
  Filled 2019-04-10 (×18): qty 6

## 2019-04-10 MED ORDER — VANCOMYCIN HCL 1750 MG/350ML IV SOLN: 1750.0000 mg | INTRAVENOUS | Status: DC

## 2019-04-10 MED ORDER — DIPHENHYDRAMINE HCL 25 MG PO CAPS
25.0000 mg | ORAL_CAPSULE | Freq: Once | ORAL | Status: AC
  Administered 2019-04-10: 10:00:00 25 mg via ORAL
  Filled 2019-04-10: qty 1

## 2019-04-10 MED ORDER — IOHEXOL 300 MG/ML  SOLN
100.0000 mL | Freq: Once | INTRAMUSCULAR | Status: AC | PRN
  Administered 2019-04-10: 01:00:00 100 mL via INTRAVENOUS

## 2019-04-10 NOTE — Care Management Important Message (Signed)
Important Message  Patient Details  Name: Robin Snow MRN: [id number] Date of Birth: 1940/07/21   Medicare Important Message Given:  Yes     Renie Ora 04/10/2019, 2:25 PM

## 2019-04-10 NOTE — Progress Notes (Signed)
CRITICAL VALUE ALERT  Critical Value:  Hemoglobin 5.5  Date & Time Notied:  04/10/2019 0830  Provider Notified: Dr. [name]  Orders Received/Actions taken: 2 unit PRBC ordered.

## 2019-04-10 NOTE — Progress Notes (Signed)
PROGRESS NOTE  Robin Snow WJX:[id number] DOB: 1940/12/08 DOA: 03/31/2019 PCP: [name], MD  Brief History   Robin Calamity Howellis a 79 y.o.femalewith medical history significant ofa.fib, HTN, CKD, stroke. Patient had R TKA in [place] in 2019. Patient recently admitted from 10/30-12/23 for dislocation of R knee prosthesis with septic joint. Closed reduction by Dr. 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 Ultimately underwent R AKA by Dr. Lajoyce Corners. This complicated by sepsis from E.Coli bacteremia, spent several days in ICU on vent. Also complicated by AKA wound dehissance.Patient finally discharged 12/23 to [facility] health care on rocephin to complete 6 weeks of therapy with end date of 1/6.   [name] full recounting of the prolonged, nearly 2 month hospital stay, please see Dr. Dennison Nancy discharge summary on 12/23.  Per SNF: patient didn't get rocephinon(12/24). At around 7pm they noticed asmall amount ofbloodydrainage from dressingand noticedfoulodor from wound. At 745pm noticed dressing was saturated and at 9pm now oozing blood around dressing. Patient was noted to be running fever of 100.7 at facility. Facility confirms that coumadin is still on hold as per DC summary plan.  No fever in ED, lab work actually looks fairly stable compared to 2 days ago.  CT of RLE femur is worrisome for hematoma with probable superimposed infection extending from amputation site.She was admitted under hospital service with Ortho to consult. She was given Rocephin and vancomycin in ED. She is scheduled to have right hip disarticulation with Dr. Lajoyce Corners.  She underwent hip disarticulation in the OR on 04/05/19 and postoperatively she had some hemorrhagic shock and became hypotensive received 3 units of albumin Neo-Synephrine as well as 3 units of PRBCs and was transferred to the intensive care unit for further monitoring.  She  is now hemodynamically stable and improved and has been transferred to PCU.  She is much more awake and alert and does have some anemia which we will continue to monitor and is stabilized.  PT OT recommending skilled nursing facility.  I spoke with orthopedics Dr. Lajoyce Corners who recommends resuming her home anticoagulation and she will be started on a heparin drip with a Coumadin bridge given her elevated CHA2DS2-VASc score of 7.  Need to closely monitor her anticoagulation as her platelet count dropped a little bit but her hemoglobin has remained stable.  Social work is in the process of assisting with placement back to [facility].  We will repeat her Covid test in the interim as she may be discharged the next 4 to 48 hours stable  Consultants  . Orthopedic surgery . Infectious disease . PCCM  Procedures  . Disarticulation of right hip with dehiscence and infection of stump . Wound VAC placement  Antibiotics   Anti-infectives (From admission, onward)   Start     Dose/Rate Route Frequency Ordered Stop   04/10/19 2000  vancomycin (VANCOREADY) IVPB 1750 mg/350 mL  Status:  Discontinued     1,750 mg 175 mL/hr over 120 Minutes Intravenous Every 24 hours 04/10/19 0847 04/10/19 0850   04/10/19 2000  vancomycin (VANCOREADY) IVPB 1250 mg/250 mL  Status:  Discontinued     1,250 mg 166.7 mL/hr over 90 Minutes Intravenous Every 24 hours 04/10/19 0850 04/10/19 0943   04/09/19 2000  vancomycin (VANCOREADY) IVPB 1500 mg/300 mL  Status:  Discontinued     1,500 mg 150 mL/hr over 120 Minutes Intravenous Every 24 hours 04/09/19 1916 04/10/19 0847   04/09/19 2000  ceFEPIme (MAXIPIME) 2 g in sodium  chloride 0.9 % 100 mL IVPB     2 g 200 mL/hr over 30 Minutes Intravenous Every 8 hours 04/09/19 1916     04/05/19 1400  ceFAZolin (ANCEF) IVPB 2g/100 mL premix     2 g 200 mL/hr over 30 Minutes Intravenous On call to O.R. 04/05/19 0723 04/05/19 1100   04/01/19 0500  cefTRIAXone (ROCEPHIN) IVPB  Status:  Discontinued     Note to Pharmacy: Indication:  E Coli Endocarditis Last Day of Therapy:  04/12/2019 Labs - Once weekly:  CBC/D and BMP, Labs - Every other week:  ESR and CRP     2 g Intravenous Every 24 hours 03/31/19 0436 03/31/19 0500   04/01/19 0400  cefTRIAXone (ROCEPHIN) 2 g in sodium chloride 0.9 % 100 mL IVPB  Status:  Discontinued     2 g 200 mL/hr over 30 Minutes Intravenous Every 24 hours 03/31/19 0501 04/09/19 1916   03/31/19 0400  vancomycin (VANCOCIN) IVPB 1000 mg/200 mL premix     1,000 mg 200 mL/hr over 60 Minutes Intravenous  Once 03/31/19 0356 03/31/19 0606   03/31/19 0100  cefTRIAXone (ROCEPHIN) 2 g in sodium chloride 0.9 % 100 mL IVPB     2 g 200 mL/hr over 30 Minutes Intravenous  Once 03/31/19 0100 03/31/19 0329    .  Subjective  The patient is resting comfortably. No new complaints. Foul smelling.  Objective   Vitals:  Vitals:   04/10/19 1300 04/10/19 1417  BP: (!) 82/46 (!) 110/54  Pulse: 60 60  Resp: 13 11  Temp:  (!) 97.5 F (36.4 C)  SpO2: 100% 100%   Exam:  Constitutional:  . The patient is awake, alert, and oriented x 3. No acute distress. Respiratory:  . No increased work of breathing. . No wheezes, rales, or rhonchi . No tactile fremitus Cardiovascular:  . Regular rate and rhythm . No murmurs, ectopy, or gallups. . No lateral PMI. No thrills. Abdomen:  . Abdomen is soft, non-tender, non-distended . No hernias, masses, or organomegaly . Normoactive bowel sounds.  Musculoskeletal:  . No cyanosis, clubbing, or edema . Foul smelling right hip wound Skin:  . No rashes, lesions, ulcers . palpation of skin: no induration or nodules . Wound VAC in place. Neurologic:  . CN 2-12 intact . Sensation all 4 extremities intact Psychiatric:  . Mental status o Mood, affect appropriate o Orientation to person, place, time  . judgment and insight appear intact  I have personally reviewed the following:   Today's Data  . Vitals, CMP, CBC  Scheduled  Meds: . sodium chloride   Intravenous Once  . amiodarone  200 mg Oral Daily  . Chlorhexidine Gluconate Cloth  6 each Topical Daily  . diltiazem  120 mg Oral Daily  . docusate  100 mg Oral BID  . feeding supplement (ENSURE ENLIVE)  237 mL Oral TID BM  . ferrous sulfate  300 mg Oral BID WC  . gabapentin  300 mg Oral Q12H  . metoprolol tartrate  50 mg Oral BID  . multivitamin with minerals  1 tablet Oral Daily  . pantoprazole  40 mg Oral Daily  . pravastatin  20 mg Oral q1800  . senna-docusate  2 tablet Oral BID  . sodium chloride flush  10-40 mL Intracatheter Q12H  . sodium chloride flush  3 mL Intravenous Once   Continuous Infusions: . sodium chloride 75 mL/hr at 04/10/19 1419  . ceFEPime (MAXIPIME) IV 2 g (04/10/19 8295)    Principal  Problem:   Wound dehiscence Active Problems:   Essential hypertension   AF (paroxysmal atrial fibrillation) (HCC)   History of bacteremia due to Escherichia coli   S/P AKA (above knee amputation), right (HCC)   Cellulitis of right lower extremity   Dehiscence of amputation stump (HCC)   Fever   LOS: 10 days   A & P  Wound dehiscence/infection of amputation site/right stump status post hip disarticulation on 04/06/2019 postoperative day 5: The patient has a foul smell and swelling at stump site. Last evening she became febrile, tachypneic and tachycardic. She appeared septic. She was given IV fluids for this. Narcotic pain control was held due to patient lethargy. Infectious disease was consulted. Plans are for surgery to take him back for repeat I&D on 04/11/2018. ID has been consulted. The patient remains on IV cefepime at this time. The patient's hemoglobin again dropped abruptly this morning to 5.5 and is receiving another 2 units of PRBC's to make a total of 5 units PRBC's transfused so far. Wound VAC in in place with thick bloody effluent from wound indicating continue muscle breakdown. Per orthopedic surgery the patient had extensive necrotic  muscles including muscles originating in the pelvis and tissue were sent for cultures and patient will need long-term antibiotics. The patient required transfer to ICU for post hemorrhagic shock after the surgery. She is now in the progressive care unit. She is receiving  IV Dilaudid 0.5 mg every 4 as needed for severe pain, p.o. oxycodone IR and oxycodone acetaminophen 1 tab p.o. twice daily as needed for pain control. Bowel regimen in place. Surgical pathology results showed a gangrenous necrosis of skin and soft tissue with atheroma formation with a luminal stenosis and dystrophic calcifications of vasculature and it stated that the gangrenous necrosis is extensive soft tissue resection margin. She has been converted from Rocephin to Cefepime IV. Infectious disease has been consulted as WBC continues to increase. Patient's VTE prophylaxis with coumadin is being bridged with heparin, but this has been held due to recent drop in hemoglobin. CT abdomen and pelvis has been performed on 04/08/2018. It has demonstrated mottled air and heterogeneous fluid in the operative bed extending from the hip joint space to the skin surface. Small peripherally enhancing fluid collections just deep to the incision inferiorly, largest collection measures 3.4 cm greatest dimension. While findings may be related to recent surgery, sterility is indeterminate by imaging, and mottled air may represent infection. Heterogeneous enlargement and mottled air involving the right obturator, gluteal and iliopsoas muscles, may be postoperative or infectious. There is no evidence of active bleeding. Also body wall edema has slightly increased from prior exam. Blood cultures were repeated.   Acute Blood Loss Anemia from Surgical Intervention: Overnight hemoglobin dropped from 7.7 to 5.5 this morning. She was given 2 units PRBC's this morning in addition to the 3 units she had received earlier in this stay. Patient has thick bloody effluent from  Wounds Vac. This is the presumed source of blood loss. Iron deficiency is being supplemented, and hemoglobin will continue to be monitored as necessary. Heparin has been held. It was being used to bridge until the patient was therapeutic on coumadin. CHADSVASC2 score is 7.  E. coli Bacteremia/Endocarditis: She has been converted to IV cefepime. I appreciate  Infectious disease's assistance. Echocardiogram is pending.   Permanent Atrial Fibrillation : Heart rates have been relatively low today ranging from 59 to 60. I will reduce dose of metoprolol. Heparin has been held due to  abrupt drop in hemoglobin. Monitor heart rates and electrolytes. CHA2DS2-VASc of 7.  Essential Hypertension: Blood pressures low this morning secondary to acute blood loss. Improved with transfusion. Dose of metoprolol has been reduced. Monitor.   Obesity with severe malnutrition in the context of acute illness/injury: Complicates all cares. Dietary has been consulted. They have recommended  Enlive p.o. 3 times daily along with Magic cup 3 times daily with meals and multivitamin with minerals daily as well as liberalizing diet to help improve p.o. intake  Hyperlipidemia: Continue Pravastatin 20 g p.o. nightly.  GERD: Continue Pantoprazole 40 mg p.o. daily.  Hypomagnesemia: 1.7 today. Monitor and replace as necessary.  Hyperbilirubinemia: Resolved and likely reactive. Monitor intermittently.   Leukocytosis, worsening: Due to complicated wound situation. Reactive in the setting of Surgical intervention and Hip Disarticulation. Pt is receiving IV cefepime. Will repeat echocardiogram as per infectious disease's recommendations. Repeat I & D on 04/11/2018.   Thrombocytopenia: Patient's platelet count dropped from 159 > 124 > 179 >165. Monitor.   Somnolence and Lethargy: Encephalopathy due to sepsis related to ongoing infectious issues related to right hip disarticulation/wound infectious. CT head was checked and did  not demonstrate any acute pathology.   I have seen and examined this patient myself. I have spent 38 minutes in her evaluation and care.  DVT prophylaxis: SCDs. Heparin had been used to bridge the patient while he was becoming therapeutic on coumadin. However, it has been stopped given blood loss anemia.  Code Status: FULL CODE  Family Communication: No family present at bedside  Disposition Plan: SNF When bed available  Ethelbert Thain, DO [facility] Direct contact: see [email or web address]  7PM-7AM contact night coverage as above 04/10/2019, 2:39 PM  LOS: 10 days

## 2019-04-10 NOTE — Progress Notes (Addendum)
Pharmacy Antibiotic Note  Robin Snow is a 79 y.o. female admitted on 03/31/2019 with Endocarditis and R hip infection.  Pharmacy has been consulted for Vancomycin dosing.  ID: CTX to 04/12/19 for endocarditis. Tmax 102.8. R hip with foul smell.  UTI per MD note  (12/25 UCx ?contaminate) - Tmax 102.8. WBC 21.2>20.1, PC 1.44>3.82, LA 2.8>1.9.   Ceftriaxone pta  >> 1/3 (supposed to end 04/12/19 for 6 wk) Vanco 1/3>> Cefepime 1/3>>  12/22 Covid: negative 12/25 COVID: negative 12/25 UCx:  <10k col/ml insignif. Growth/Final 12/25 BCx:  negative 12/29 MRSA PCR: negative  1/4: Vancomycin 1750 mg IV Q 24 hrs. Goal AUC 400-550. Expected AUC: 499.4 SCr used: 0.8 Vd 0.5 (BMI=30)  Plan: - Vanco 1250mg  IV q24hr decreased - Cefepime cefepime 2gm IV q8h   Height: 5\' 7"  (170.2 cm) Weight: 192 lb 14.4 oz (87.5 kg) IBW/kg (Calculated) : 61.6  Temp (24hrs), Avg:98.9 F (37.2 C), Min:97.8 F (36.6 C), Max:102.8 F (39.3 C)  Recent Labs  Lab 04/07/19 0305 04/08/19 0652 04/09/19 0406 04/09/19 1757 04/09/19 2016 04/09/19 2339 04/10/19 0617 04/10/19 0630  WBC 15.3* 14.2* 16.9* 21.2*  --   --   --  20.1*  CREATININE 0.85 0.70 0.70 0.69  --   --  0.77  --   LATICACIDVEN  --   --   --   --  2.8* 1.9  --   --     Estimated Creatinine Clearance: 64.8 mL/min (by C-G formula based on SCr of 0.77 mg/dL).    Allergies  Allergen Reactions  . Aspirin Hives and Swelling    Angioedema   . Rofecoxib Hives  . Hydrocodone Hives    Tolerates oxycodone and tramadol   Wendolyn Raso S. 06/08/19, PharmD, BCPS Clinical Staff Pharmacist [email or web address] 06/08/19 04/10/2019 8:48 AM

## 2019-04-10 NOTE — H&P (View-Only) (Signed)
Patient ID: Robin Snow, female   DOB: 07/11/1940, 78 y.o.   MRN: [id number] Patient is seen in follow-up status post right hip disarticulation.  Patient could answer questions appropriately this morning but had difficulty formulating new thoughts.  Her hemoglobin remains low at 7.7 her white blood cell count is increased to 21.2 there is thick bloody drainage in the wound VAC canister consistent with further loss of muscle tissue.  Have discussed with the patient recommendation to proceed with repeat surgery on the right hip disarticulation for further irrigation and debridement.  Patient states she understands wished to proceed with surgery we will plan for surgery on Wednesday. 

## 2019-04-10 NOTE — Progress Notes (Signed)
   04/10/19 0120  Vitals  BP (!) 84/54  MAP (mmHg) 65  Pulse Rate 60  ECG Heart Rate 60  Resp 13  paged X [name] NP  About pt's BP, pt is alert to self per baseline 500 NS bolus ordered and given

## 2019-04-10 NOTE — Consult Note (Signed)
[facility]    Date of Admission:  03/31/2019     Total days of antibiotics 40  Vancomycin + Cefepime   Previously received ceftriaxone since 03/01/19 after AKA         Reason for Consult: Cellulitis / muscle necrosis of previous AKA  Referring Provider: Benny Lennert  Primary Care Provider: [name], MD    Assessment: Robin Snow is a 79 y.o. female with history of recent E coli bacteremia secondary to right knee prosthetic joint infection s/p AKA and likely aortic valve endocarditis seen on TEE. She nearly completed a 6 week course of IV ceftriaxone and came back with fever and wound dehiscence/drainage from her AKA incision. She was taken to the OR on 12/30 for right hip disarticulation which revealed a large amount of necrotic muscles extending up to the right hip. No mention as to the integrity of the pelvic bone.   She developed a high fever POD4 and broadened to vancomycin + cefepime. It is difficult to assess her surgical incision with wound vac she has continued to have large volume drainage (~1600cc over 24h x 2 days). Being her MRSA PCR is negative still would stop the vancomycin for now and continue with cefepime while we watch cultures. I unfortunately do not see any cultures from OR, only path report. I do not see any other findings that would indicate new infection aside from known sites - PICC line is non-tender and no drainage (initial blood cultures negative also); not certain she had UTI at presentation either with low colony forming units of multiple species.     Plan: 1. Stop vancomycin  2. Follow micro data 3. Await surgery re-evaluation of surgical wound bed 4. Continue cefepime     Principal Problem:   Wound dehiscence Active Problems:   Bacteremia due to Escherichia coli   Essential hypertension   AF (paroxysmal atrial fibrillation) (HCC)   S/P AKA (above knee amputation), right (HCC)   Cellulitis of right  lower extremity   Dehiscence of amputation stump ([place])   . sodium chloride   Intravenous Once  . sodium chloride   Intravenous Once  . acetaminophen  650 mg Oral Once  . amiodarone  200 mg Oral Daily  . Chlorhexidine Gluconate Cloth  6 each Topical Daily  . diltiazem  120 mg Oral Daily  . diphenhydrAMINE  25 mg Oral Once  . docusate  100 mg Oral BID  . feeding supplement (ENSURE ENLIVE)  237 mL Oral TID BM  . ferrous sulfate  300 mg Oral BID WC  . gabapentin  300 mg Oral Q12H  . metoprolol tartrate  50 mg Oral BID  . multivitamin with minerals  1 tablet Oral Daily  . pantoprazole  40 mg Oral Daily  . pravastatin  20 mg Oral q1800  . senna-docusate  2 tablet Oral BID  . sodium chloride flush  10-40 mL Intracatheter Q12H  . sodium chloride flush  3 mL Intravenous Once    HPI: Robin Snow is a 79 y.o. female re-admitted after a long hospitalization in November 2020 for wound dehiscence with foul smelling drainage and fever from [facility] SN[facility].   She was previously hospitalized for E coli bacteremia secondary to infection involving her right knee prosthetic joint and complicated by likely aortic valve endocarditis verified by TEE. She underwent arthrocentesis and closed manipulation after she experienced a dislocation of the knee joint  on 11/18. Wound cultures at that time taken from the knee and blood cultures both grew E coli (R-amp, trimethoprim sulfa). Postoperatively she continued to have significant bleeding from the knee requiring emergency AKA by Dr. Lajoyce Corners on 11/25. He found extensive necrotic muscle amongst a large hematoma with abscess that extended up to the hip; all muscle was described to be necrotic and he suspected she would require further disarticulation of the hip to control infection. Postoperatively her course was complicated by respiratory failure and ICU length of stay.   After her ICU stay, she was followed by orthopedics and determined no further  surgical intervention was needed at the time as she seemed to be improving and afebrile. Throughout her hospitalization she was treated with ceftriaxone 2gm IV Q24h with projected end date of 04/12/19 to complete 6 weeks for presumed osteomyelitis/deep infection of the right thigh/hip and native AV endocarditis.  She was discharged to SNF on 12/23 and required readmission 48 hours later for fever and wound drainage/dehiscience. She was continued on Ceftriaxone alone after receiving one dose of IV vancomycin in ER.   Her wound was found to be largely dehisced with hematoma and foul odor. She underwent right hip disarticulation on 04/05/19 with Dr. Lajoyce Corners - operative findings reviewed and indicate necrotic muscles that extended into the pelvis including the gluteus muscles, short external rotators, iliopsoas muscles. Muscle was debrided back to viable margins.  She required vasopressors and blood products following her OR procedure.   She had a high fever to nearly 103 F on 04/08/18. Initial blood cultures were negative on 12/24; these have been repeated with onset of fever.  She has a RUE PICC In place still without any concern for pain/tenderness to her knowledge. Vancomycin was added and ceftriaxone broadened to Cefepime with fever. She has about 500 cc serosanguinous drainage coming from wound vac.   In discussing further with Ms Seckinger she was under the impression she was home with her daughter trying to take care of her special needs son and did not mention anything about SNF. She describes that she is cold presently but no knowledge of any fevers. She is aware of where she is but not the time. Majority of the history was taken from the chart records.    Review of Systems: Review of Systems  Constitutional: Positive for fever. Negative for chills and malaise/fatigue.  HENT: Negative for congestion and sore throat.   Respiratory: Negative for cough, sputum production and shortness of breath.     Cardiovascular: Negative for chest pain and palpitations.  Gastrointestinal: Negative for abdominal pain and diarrhea.  Genitourinary: Negative for dysuria, flank pain and frequency.  Musculoskeletal: Negative for back pain and neck pain.  Skin: Negative for itching and rash.  Neurological: Positive for weakness. Negative for dizziness and headaches.    Past Medical History:  Diagnosis Date  . AKI (acute kidney injury) (HCC) 04/2016  . Anemia   . Arthritis   . Chronic atrial fibrillation (HCC)   . Chronic edema   . Chronic venous insufficiency   . CKD (chronic kidney disease), stage III   . Coagulopathy (HCC)   . Diabetes mellitus    Type 2  . Diverticulosis   . Dyslipidemia   . GERD (gastroesophageal reflux disease)   . GI bleed 2015   a. lower GI from diverticular source 2015.  [name] Hx of transfusion of packed red blood cells   . Hypertension   . Morbid obesity (HCC)   . Obstructive  sleep apnea    on CPAP  . Pulmonary hypertension (HCC)   . Pyelonephritis 04/2016  . Renal infarct (HCC)    a. 04/2016- adm with flank pain, ? pyelo vs renal infarct in setting of recent subtherapeutic INR.  [name] Sinus bradycardia   . Stroke [facility]) 2015    Social History   Tobacco Use  . Smoking status: Former Smoker    Packs/day: 0.12    Years: 34.00    Pack years: 4.08    Types: Cigarettes    Quit date: 07/07/1993    Years since quitting: 25.7  . Smokeless tobacco: Never Used  Substance Use Topics  . Alcohol use: No    Comment: no longer drinks alcohol  . Drug use: No    Family History  Problem Relation Age of Onset  . Cancer Father   . Stroke Maternal Aunt    Allergies  Allergen Reactions  . Aspirin Hives and Swelling    Angioedema   . Rofecoxib Hives  . Hydrocodone Hives    Tolerates oxycodone and tramadol    OBJECTIVE: Blood pressure (!) 96/52, pulse 60, temperature 98.6 F (37 C), temperature source Axillary, resp. rate 14, height 5\' 7"  (1.702 m), weight 87.5 kg,  SpO2 100 %.  Physical Exam Constitutional:      Appearance: Normal appearance. She is not ill-appearing.     Comments: Sitting upright in bed. No distress. She is cold   HENT:     Mouth/Throat:     Mouth: Mucous membranes are moist.     Pharynx: Oropharynx is clear.  Eyes:     General: No scleral icterus.    Pupils: Pupils are equal, round, and reactive to light.  Cardiovascular:     Rate and Rhythm: Normal rate and regular rhythm.     Heart sounds: No murmur.     Comments: AV pacing on telemetry  Pulmonary:     Effort: Pulmonary effort is normal.     Breath sounds: Normal breath sounds. No wheezing or rhonchi.  Abdominal:     General: Bowel sounds are normal. There is no distension.     Tenderness: There is no abdominal tenderness.  Musculoskeletal:     Comments: Right hip with large wound vac dressing in place. About 500 cc serosanguinous drainage in canister. Difficult to assess periwound tissues.   Skin:    General: Skin is warm and dry.     Capillary Refill: Capillary refill takes less than 2 seconds.  Neurological:     Mental Status: She is alert and oriented to person, place, and time.  Psychiatric:        Mood and Affect: Mood normal.     Lab Results Lab Results  Component Value Date   WBC 20.1 (H) 04/10/2019   HGB 5.5 (LL) 04/10/2019   HCT 17.9 (L) 04/10/2019   MCV 100.6 (H) 04/10/2019   PLT 165 04/10/2019    Lab Results  Component Value Date   CREATININE 0.77 04/10/2019   BUN 16 04/10/2019   NA 140 04/10/2019   K 3.9 04/10/2019   CL 109 04/10/2019   CO2 26 04/10/2019    Lab Results  Component Value Date   ALT 18 04/10/2019   AST 29 04/10/2019   ALKPHOS 81 04/10/2019   BILITOT 0.7 04/10/2019     Microbiology: Recent Results (from the past 240 hour(s))  Surgical pcr screen     Status: None   Collection Time: 04/04/19 11:13 PM   Specimen: Nasal  Mucosa; Nasal Swab  Result Value Ref Range Status   MRSA, PCR NEGATIVE NEGATIVE Final    Staphylococcus aureus NEGATIVE NEGATIVE Final    Comment: (NOTE) The Xpert SA Assay (FDA approved for NASAL specimens in patients 50 years of age and older), is one component of a comprehensive surveillance program. It is not intended to diagnose infection nor to guide or monitor treatment. Performed at [facility], [address], [place], [place] [address]     Rexene Alberts, MSN, NP-C [facility]  [email or web address] Pager: [phone] Office: [phone] [facility] Main Line: [phone]

## 2019-04-10 NOTE — Progress Notes (Signed)
Pt BP 82/41 despite 1 unit of PRBC infusing. MD paged and aware of low BP. No new orders received.  Versie Starks, RN

## 2019-04-10 NOTE — Progress Notes (Signed)
Patient ID: Robin Snow, female   DOB: Apr 20, 1940, 79 y.o.   MRN: [id number] Patient is seen in follow-up status post right hip disarticulation.  Patient could answer questions appropriately this morning but had difficulty formulating new thoughts.  Her hemoglobin remains low at 7.7 her white blood cell count is increased to 21.2 there is thick bloody drainage in the wound VAC canister consistent with further loss of muscle tissue.  Have discussed with the patient recommendation to proceed with repeat surgery on the right hip disarticulation for further irrigation and debridement.  Patient states she understands wished to proceed with surgery we will plan for surgery on Wednesday.

## 2019-04-11 DIAGNOSIS — R509 Fever, unspecified: Secondary | ICD-10-CM

## 2019-04-11 LAB — BASIC METABOLIC PANEL
Anion gap: 9 (ref 5–15)
BUN: 17 mg/dL (ref 8–23)
CO2: 23 mmol/L (ref 22–32)
Calcium: 7.7 mg/dL — ABNORMAL LOW (ref 8.9–10.3)
Chloride: 111 mmol/L (ref 98–111)
Creatinine, Ser: 0.7 mg/dL (ref 0.44–1.00)
GFR calc Af Amer: >60 mL/min (ref >60)
GFR calc non Af Amer: >60 mL/min (ref >60)
Glucose, Bld: 80 mg/dL (ref 70–99)
Potassium: 3.9 mmol/L (ref 3.5–5.1)
Sodium: 143 mmol/L (ref 135–145)

## 2019-04-11 LAB — HEMOGLOBIN AND HEMATOCRIT, BLOOD
HCT: 30.1 % — ABNORMAL LOW (ref 36.0–46.0)
Hemoglobin: 10.2 g/dL — ABNORMAL LOW (ref 12.0–15.0)

## 2019-04-11 LAB — CBC
HCT: 24.1 % — ABNORMAL LOW (ref 36.0–46.0)
Hemoglobin: 7.7 g/dL — ABNORMAL LOW (ref 12.0–15.0)
MCH: 28.6 pg (ref 26.0–34.0)
MCHC: 32 g/dL (ref 30.0–36.0)
MCV: 89.6 fL (ref 80.0–100.0)
Platelets: 178 10*3/uL (ref 150–400)
RBC: 2.69 MIL/uL — ABNORMAL LOW (ref 3.87–5.11)
RDW: 20.6 % — ABNORMAL HIGH (ref 11.5–15.5)
WBC: 20.7 10*3/uL — ABNORMAL HIGH (ref 4.0–10.5)
nRBC: 0.1 % (ref 0.0–0.2)

## 2019-04-11 LAB — PROCALCITONIN: Procalcitonin: 3.42 ng/mL

## 2019-04-11 LAB — PROTIME-INR
INR: 1.6 — ABNORMAL HIGH (ref 0.8–1.2)
Prothrombin Time: 18.7 seconds — ABNORMAL HIGH (ref 11.4–15.2)

## 2019-04-11 LAB — PREPARE RBC (CROSSMATCH)

## 2019-04-11 MED ORDER — SODIUM CHLORIDE 0.9% IV SOLUTION: Freq: Once | INTRAVENOUS | Status: AC

## 2019-04-11 MED ORDER — CHLORHEXIDINE GLUCONATE 4 % EX LIQD
60.0000 mL | Freq: Once | CUTANEOUS | Status: AC
  Administered 2019-04-12: 4 via TOPICAL
  Filled 2019-04-11: qty 60

## 2019-04-11 MED ORDER — DIPHENHYDRAMINE HCL 25 MG PO CAPS
25.0000 mg | ORAL_CAPSULE | Freq: Once | ORAL | Status: AC
  Administered 2019-04-11: 25 mg via ORAL
  Filled 2019-04-11: qty 1

## 2019-04-11 MED ORDER — ACETAMINOPHEN 325 MG PO TABS
650.0000 mg | ORAL_TABLET | Freq: Once | ORAL | Status: AC
  Administered 2019-04-11: 650 mg via ORAL
  Filled 2019-04-11: qty 2

## 2019-04-11 NOTE — Progress Notes (Signed)
Patient comfortable in bed. HGB 7.7 this morning. Per Dr Lajoyce Corners will transfuse 2units  Blood today in preparation for surgery tomorrow.

## 2019-04-11 NOTE — Progress Notes (Signed)
[facility]  Date of Admission:  03/31/2019      Total days of antibiotics 41  Day 2 cefepime         ASSESSMENT: Robin Snow is a 79 y.o. female with known E coli infection of right prosthetic knee with extension up to right thigh musculature now s/p hip disarticulation. She has remained afebrile since high fever on 1/03. Repeated blood cultures remain without growth. Dr. Lajoyce Corners is planning take back to OR for wound debridement tomorrow; would send any non-viable tissue for culture to help with guiding further antibiotic therapy.    PLAN: 1. Continue cefepime  2. Follow cultures taken in OR 1/6   Principal Problem:   Wound dehiscence Active Problems:   History of bacteremia due to Escherichia coli   Essential hypertension   AF (paroxysmal atrial fibrillation) (HCC)   S/P AKA (above knee amputation), right (HCC)   Cellulitis of right lower extremity   Dehiscence of amputation stump (HCC)   Fever   . sodium chloride   Intravenous Once  . amiodarone  200 mg Oral Daily  . Chlorhexidine Gluconate Cloth  6 each Topical Daily  . diltiazem  120 mg Oral Daily  . docusate  100 mg Oral BID  . feeding supplement (ENSURE ENLIVE)  237 mL Oral TID BM  . ferrous sulfate  300 mg Oral BID WC  . gabapentin  300 mg Oral Q12H  . metoprolol tartrate  25 mg Oral BID  . multivitamin with minerals  1 tablet Oral Daily  . pantoprazole  40 mg Oral Daily  . pravastatin  20 mg Oral q1800  . senna-docusate  2 tablet Oral BID  . sodium chloride flush  10-40 mL Intracatheter Q12H  . sodium chloride flush  3 mL Intravenous Once    SUBJECTIVE: Sleeping comfortably in bed.   Review of Systems: Review of Systems  Unable to perform ROS: Other  Patient sleeping - I did not wake her.   Allergies  Allergen Reactions  . Aspirin Hives and Swelling    Angioedema   . Rofecoxib Hives  . Hydrocodone Hives    Tolerates oxycodone and tramadol     OBJECTIVE: Vitals:   04/11/19 0842 04/11/19 1007 04/11/19 1102 04/11/19 1132  BP: (!) 106/48 (!) 116/51 104/63 101/63  Pulse: 60 89 60 60  Resp: 16 11 11 13   Temp: (!) 97.5 F (36.4 C)  (!) 97.5 F (36.4 C) 97.6 F (36.4 C)  TempSrc: Oral  Oral Oral  SpO2: 100%  100% 100%  Weight:      Height:       Body mass index is 30.21 kg/m.  Physical Exam Constitutional:      General: She is not in acute distress. Cardiovascular:     Rate and Rhythm: Normal rate and regular rhythm.     Heart sounds: No murmur.  Pulmonary:     Effort: Pulmonary effort is normal.     Breath sounds: Normal breath sounds.  Abdominal:     Palpations: Abdomen is soft.  Musculoskeletal:     Comments: R hip wound vac with ~ 100cc serosanguinous drainage.   Skin:    General: Skin is warm and dry.     Capillary Refill: Capillary refill takes less than 2 seconds.     Lab Results Lab Results  Component Value Date   WBC 20.7 (H) 04/11/2019   HGB 7.7 (L) 04/11/2019   HCT  24.1 (L) 04/11/2019   MCV 89.6 04/11/2019   PLT 178 04/11/2019    Lab Results  Component Value Date   CREATININE 0.70 04/11/2019   BUN 17 04/11/2019   NA 143 04/11/2019   K 3.9 04/11/2019   CL 111 04/11/2019   CO2 23 04/11/2019    Lab Results  Component Value Date   ALT 18 04/10/2019   AST 29 04/10/2019   ALKPHOS 81 04/10/2019   BILITOT 0.7 04/10/2019     Microbiology: Recent Results (from the past 240 hour(s))  Surgical pcr screen     Status: None   Collection Time: 04/04/19 11:13 PM   Specimen: Nasal Mucosa; Nasal Swab  Result Value Ref Range Status   MRSA, PCR NEGATIVE NEGATIVE Final   Staphylococcus aureus NEGATIVE NEGATIVE Final    Comment: (NOTE) The Xpert SA Assay (FDA approved for NASAL specimens in patients 12 years of age and older), is one component of a comprehensive surveillance program. It is not intended to diagnose infection nor to guide or monitor treatment. Performed at [facility], [place] [address], [place], [place] [address]   Culture, blood (routine x 2)     Status: None (Preliminary result)   Collection Time: 04/09/19  8:06 PM   Specimen: BLOOD  Result Value Ref Range Status   Specimen Description BLOOD LEFT ANTECUBITAL  Final   Special Requests   Final    BOTTLES DRAWN AEROBIC AND ANAEROBIC Blood Culture results may not be optimal due to an inadequate volume of blood received in culture bottles   Culture   Final    NO GROWTH 2 DAYS Performed at [facility], [place] [address], [place], [place] [address]    Report Status PENDING  Incomplete  Culture, blood (routine x 2)     Status: None (Preliminary result)   Collection Time: 04/09/19  8:16 PM   Specimen: BLOOD  Result Value Ref Range Status   Specimen Description BLOOD LEFT ANTECUBITAL  Final   Special Requests   Final    BOTTLES DRAWN AEROBIC AND ANAEROBIC Blood Culture results may not be optimal due to an inadequate volume of blood received in culture bottles   Culture   Final    NO GROWTH 2 DAYS Performed at [facility], [place] [address] [place],  [address]    Report Status PENDING  Incomplete     Janene Madeira, MSN, NP-C [email or web address] [email or web address] Pager: [phone] Office: [phone] [place]: [phone]

## 2019-04-11 NOTE — Progress Notes (Signed)
Spoke with Dr Lajoyce Corners ref to pt being confused and unable to sign consent. Dr Lajoyce Corners advises he will contact family in the am and to keep pt NPO as he will move pt to end of schedule. Also spoke with Dr ref to possible making pt DNR.  Lacy Duverney, RN

## 2019-04-11 NOTE — Progress Notes (Signed)
PROGRESS NOTE  Robin Snow:[id number] DOB: [birth year] DOA: 03/31/2019 PCP: [name], MD  Brief History   Robin Snow a 79 y.o.femalewith medical history significant ofa.fib, HTN, CKD, stroke. Patient had R TKA in [place] in 2019. Patient recently admitted from 10/30-12/23 for dislocation of R knee prosthesis with septic joint. Closed reduction by Dr. 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 Ultimately underwent R AKA by Dr. Lajoyce Corners. This complicated by sepsis from E.Coli bacteremia, spent several days in ICU on vent. Also complicated by AKA wound dehissance.Patient finally discharged 12/23 to [facility] health care on rocephin to complete 6 weeks of therapy with end date of 1/6.   [name] full recounting of the prolonged, nearly 2 month hospital stay, please see Dr. Dennison Nancy discharge summary on 12/23.  Per SNF: patient didn't get rocephinon(12/24). At around 7pm they noticed asmall amount ofbloodydrainage from dressingand noticedfoulodor from wound. At 745pm noticed dressing was saturated and at 9pm now oozing blood around dressing. Patient was noted to be running fever of 100.7 at facility. Facility confirms that coumadin is still on hold as per DC summary plan.  No fever in ED, lab work actually looks fairly stable compared to 2 days ago.  CT of RLE femur is worrisome for hematoma with probable superimposed infection extending from amputation site.She was admitted under hospital service with Ortho to consult. She was given Rocephin and vancomycin in ED. She is scheduled to have right hip disarticulation with Dr. Lajoyce Corners.  She underwent hip disarticulation in the OR on 04/05/19 and postoperatively she had some hemorrhagic shock and became hypotensive received 3 units of albumin Neo-Synephrine as well as 3 units of PRBCs and was transferred to the intensive care unit for further monitoring.  She  is now hemodynamically stable and improved and has been transferred to PCU.  She is much more awake and alert and does have some anemia which we will continue to monitor and is stabilized.  PT OT recommending skilled nursing facility.  I spoke with orthopedics Dr. Lajoyce Corners who recommends resuming her home anticoagulation and she will be started on a heparin drip with a Coumadin bridge given her elevated CHA2DS2-VASc score of 7.  Need to closely monitor her anticoagulation as her platelet count dropped a little bit but her hemoglobin has remained stable.  Orthopedic surgery has transfused the patient with 2 units PRBC's in preparation to take her back to the OR on 04/11/2018 for repeat debridement.  Social work is in the process of assisting with placement back to [facility].  We will repeat her Covid test in the interim as she may be discharged the next 4 to 48 hours stable  Consultants  . Orthopedic surgery . Infectious disease . PCCM  Procedures  . Disarticulation of right hip with dehiscence and infection of stump . Wound VAC placement  Antibiotics   Anti-infectives (From admission, onward)   Start     Dose/Rate Route Frequency Ordered Stop   04/10/19 2000  vancomycin (VANCOREADY) IVPB 1750 mg/350 mL  Status:  Discontinued     1,750 mg 175 mL/hr over 120 Minutes Intravenous Every 24 hours 04/10/19 0847 04/10/19 0850   04/10/19 2000  vancomycin (VANCOREADY) IVPB 1250 mg/250 mL  Status:  Discontinued     1,250 mg 166.7 mL/hr over 90 Minutes Intravenous Every 24 hours 04/10/19 0850 04/10/19 0943   04/09/19 2000  vancomycin (VANCOREADY) IVPB 1500 mg/300 mL  Status:  Discontinued     1,500  mg 150 mL/hr over 120 Minutes Intravenous Every 24 hours 04/09/19 1916 04/10/19 0847   04/09/19 2000  ceFEPIme (MAXIPIME) 2 g in sodium chloride 0.9 % 100 mL IVPB     2 g 200 mL/hr over 30 Minutes Intravenous Every 8 hours 04/09/19 1916     04/05/19 1400  ceFAZolin (ANCEF) IVPB 2g/100 mL premix     2 g 200  mL/hr over 30 Minutes Intravenous On call to O.R. 04/05/19 0723 04/05/19 1100   04/01/19 0500  cefTRIAXone (ROCEPHIN) IVPB  Status:  Discontinued    Note to Pharmacy: Indication:  E Coli Endocarditis Last Day of Therapy:  04/12/2019 Labs - Once weekly:  CBC/D and BMP, Labs - Every other week:  ESR and CRP     2 g Intravenous Every 24 hours 03/31/19 0436 03/31/19 0500   04/01/19 0400  cefTRIAXone (ROCEPHIN) 2 g in sodium chloride 0.9 % 100 mL IVPB  Status:  Discontinued     2 g 200 mL/hr over 30 Minutes Intravenous Every 24 hours 03/31/19 0501 04/09/19 1916   03/31/19 0400  vancomycin (VANCOCIN) IVPB 1000 mg/200 mL premix     1,000 mg 200 mL/hr over 60 Minutes Intravenous  Once 03/31/19 0356 03/31/19 0606   03/31/19 0100  cefTRIAXone (ROCEPHIN) 2 g in sodium chloride 0.9 % 100 mL IVPB     2 g 200 mL/hr over 30 Minutes Intravenous  Once 03/31/19 0100 03/31/19 0329     Subjective  The patient is resting comfortably. No new complaints. Foul smelling.  Objective   Vitals:  Vitals:   04/11/19 1425 04/11/19 1549  BP: (!) 114/59   Pulse: 60   Resp: 11   Temp: 97.6 F (36.4 C)   SpO2: 98% 100%   Exam:  Constitutional:  . The patient is awake, alert, and oriented x 3. No acute distress. Respiratory:  . No increased work of breathing. . No wheezes, rales, or rhonchi . No tactile fremitus Cardiovascular:  . Regular rate and rhythm . No murmurs, ectopy, or gallups. . No lateral PMI. No thrills. Abdomen:  . Abdomen is soft, non-tender, non-distended . No hernias, masses, or organomegaly . Normoactive bowel sounds.  Musculoskeletal:  . No cyanosis, clubbing, or edema . Foul smelling right hip wound Skin:  . No rashes, lesions, ulcers . palpation of skin: no induration or nodules . Wound VAC in place. Neurologic:  . CN 2-12 intact . Sensation all 4 extremities intact Psychiatric:  . Mental status o Mood, affect appropriate o Orientation to person, place, time  . judgment  and insight appear intact  I have personally reviewed the following:   Today's Data  . Vitals, CMP, CBC, Procalcitonin  Scheduled Meds: . sodium chloride   Intravenous Once  . amiodarone  200 mg Oral Daily  . chlorhexidine  60 mL Topical Once  . Chlorhexidine Gluconate Cloth  6 each Topical Daily  . diltiazem  120 mg Oral Daily  . docusate  100 mg Oral BID  . feeding supplement (ENSURE ENLIVE)  237 mL Oral TID BM  . ferrous sulfate  300 mg Oral BID WC  . gabapentin  300 mg Oral Q12H  . metoprolol tartrate  25 mg Oral BID  . multivitamin with minerals  1 tablet Oral Daily  . pantoprazole  40 mg Oral Daily  . pravastatin  20 mg Oral q1800  . senna-docusate  2 tablet Oral BID  . sodium chloride flush  10-40 mL Intracatheter Q12H  . sodium chloride  flush  3 mL Intravenous Once   Continuous Infusions: . sodium chloride 75 mL/hr at 04/11/19 1400  . ceFEPime (MAXIPIME) IV 2 g (04/11/19 1430)    Principal Problem:   Wound dehiscence Active Problems:   Essential hypertension   AF (paroxysmal atrial fibrillation) (HCC)   History of bacteremia due to Escherichia coli   S/P AKA (above knee amputation), right (HCC)   Cellulitis of right lower extremity   Dehiscence of amputation stump (HCC)   Fever   LOS: 11 days   A & P  Wound dehiscence/infection of amputation site/right stump status post hip disarticulation on 04/06/2019 postoperative day 5: The patient has a foul smell and swelling at stump site. Last evening she became febrile, tachypneic and tachycardic. She appeared septic. She was given IV fluids for this. Narcotic pain control was held due to patient lethargy. Infectious disease was consulted. Plans are for surgery to take him back for repeat I&D on 04/11/2018. ID has been consulted. The patient remains on IV cefepime at this time. The patient's hemoglobin again dropped abruptly this morning to 5.5 and is receiving another 2 units of PRBC's to make a total of 5 units PRBC's  transfused so far. Wound VAC in in place with thick bloody effluent from wound indicating continue muscle breakdown. Per orthopedic surgery the patient had extensive necrotic muscles including muscles originating in the pelvis and tissue were sent for cultures and patient will need long-term antibiotics. The patient required transfer to ICU for post hemorrhagic shock after the surgery. She is now in the progressive care unit. She is receiving  IV Dilaudid 0.5 mg every 4 as needed for severe pain, p.o. oxycodone IR and oxycodone acetaminophen 1 tab p.o. twice daily as needed for pain control. Bowel regimen in place. Surgical pathology results showed a gangrenous necrosis of skin and soft tissue with atheroma formation with a luminal stenosis and dystrophic calcifications of vasculature and it stated that the gangrenous necrosis is extensive soft tissue resection margin. She has been converted from Rocephin to Cefepime IV. Infectious disease has been consulted as WBC continues to increase. Patient's VTE prophylaxis with coumadin is being bridged with heparin, but this has been held due to recent drop in hemoglobin. CT abdomen and pelvis has been performed on 04/08/2018. It has demonstrated mottled air and heterogeneous fluid in the operative bed extending from the hip joint space to the skin surface. Small peripherally enhancing fluid collections just deep to the incision inferiorly, largest collection measures 3.4 cm greatest dimension. While findings may be related to recent surgery, sterility is indeterminate by imaging, and mottled air may represent infection. Heterogeneous enlargement and mottled air involving the right obturator, gluteal and iliopsoas muscles, may be postoperative or infectious. There is no evidence of active bleeding. Also body wall edema has slightly increased from prior exam. Blood cultures were repeated. The patient will returned to the OR with orthopedic surgery tomorrow for repeat  debridement.  Acute Blood Loss Anemia from Surgical Intervention: Overnight hemoglobin dropped from 7.7 to 5.5 this morning. She was given 2 units PRBC's this morning in addition to the 3 units she had received earlier in this stay. Patient has thick bloody effluent from Wounds Vac. This is the presumed source of blood loss. Iron deficiency is being supplemented, and hemoglobin will continue to be monitored as necessary. Heparin has been held. It was being used to bridge until the patient was therapeutic on coumadin. CHADSVASC2 score is 7. She is receiving 2 units of  PRBC's in preparation for return to OR tomorrow.  E. coli Bacteremia/Endocarditis: She has been converted to IV cefepime. I appreciate  Infectious disease's assistance.   Permanent Atrial Fibrillation : Heart rates have been relatively low today ranging from 59 to 60. I will reduce dose of metoprolol. Heparin has been held due to abrupt drop in hemoglobin. Monitor heart rates and electrolytes. CHA2DS2-VASc of 7.  Essential Hypertension: Blood pressures normotensive today after blood transfusion and reduction of metoprolol dose. Monitor.   Obesity with severe malnutrition in the context of acute illness/injury: Complicates all cares. Dietary has been consulted. They have recommended  Enlive p.o. 3 times daily along with Magic cup 3 times daily with meals and multivitamin with minerals daily as well as liberalizing diet to help improve p.o. intake.  Hyperlipidemia: Continue Pravastatin 20 g p.o. nightly.  GERD: Continue Pantoprazole 40 mg p.o. daily.  Hypomagnesemia: 1.7 today. Monitor and replace as necessary.  Hyperbilirubinemia: Resolved and likely reactive. Monitor intermittently.   Leukocytosis, worsening: Due to complicated wound situation. Reactive in the setting of Surgical intervention and Hip Disarticulation. Pt is receiving IV cefepime. Will repeat echocardiogram as per infectious disease's recommendations. Repeat I  & D on 04/11/2018.   Thrombocytopenia: Patient's platelet count dropped from 159 > 124 > 179 >165>178. Monitor.   Somnolence and Lethargy: Encephalopathy due to sepsis related to ongoing infectious issues related to right hip disarticulation/wound infectious. CT head was checked and did not demonstrate any acute pathology.   I have seen and examined this patient myself. I have spent 32 minutes in her evaluation and care.  DVT prophylaxis: SCDs. Heparin had been used to bridge the patient while he was becoming therapeutic on coumadin. However, it has been stopped given blood loss anemia.  Code Status: FULL CODE  Family Communication: No family present at bedside  Disposition Plan: SNF When bed available  Tereka Thorley, DO [facility] Direct contact: see [email or web address]  7PM-7AM contact night coverage as above 04/11/2019, 4:27 PM  LOS: 10 days

## 2019-04-11 NOTE — Progress Notes (Signed)
Physical Therapy Treatment Patient Details Name: Robin Snow MRN: [id number] DOB: January 22, 1941 Today's Date: 04/11/2019    History of Present Illness PT adm with wound dehiscence/infection of rt AKA. Underwent rt hip disarticulation on 12/30. PMH - Hospitalized from 10/29-11/4 with sepsis related to E. coli bacteremia, UTI, and bacterial arthritis right knee. Found to have dislocated right knee prothestic--02/22/19 s/p Closed reduction of right knee prosthetic dislocation. 11/25 Rt AKA. Afib, CKD, HTN, DM    PT Comments    Pt in bed receiving blood upon PT arrival, agreeable to therapy activity despite lingering lethargy. The pt had minimal engagement in this session, required repeated stimuli and 5-10 sec of processing time after each verbal cue. Therefore, I started the session with bed-level exercises of the LLE and BUE which the patient completed slowly. The pt began to express more pain when moving LLE and did not want to continue with active or passive ROM of LLE. At this time, further mobility was held for pt/therapist safety, and because the session will be more beneficial with improved participation. The pt will continue to benefit from skilled PT to progress functional mobility, strength, and independence.    Follow Up Recommendations  SNF;Supervision/Assistance - 24 hour     Equipment Recommendations       Recommendations for Other Services       Precautions / Restrictions Precautions Precautions: Fall Precaution Comments: wound vac R hip disarticulation Required Braces or Orthoses: Knee Immobilizer - Right Restrictions Weight Bearing Restrictions: Yes RLE Weight Bearing: Non weight bearing Other Position/Activity Restrictions: R AKA    Mobility  Bed Mobility Overal bed mobility: Needs Assistance(pt unable to participate fulling in bed-level exercises at this time, defer further mobility until she is more engaged)                Transfers Overall transfer  level: Needs assistance(pt unable to participate fulling in bed-level exercises at this time, defer further mobility until she is more engaged)               General transfer comment: unable  Ambulation/Gait                 Stairs             Wheelchair Mobility    Modified Rankin (Stroke Patients Only)       Balance Overall balance assessment: Needs assistance(pt unable to participate fulling in bed-level exercises at this time, defer further mobility until she is more engaged)             Standing balance comment: unable to stand                            Cognition Arousal/Alertness: Lethargic Behavior During Therapy: Flat affect Overall Cognitive Status: Difficult to assess(pt with minimal enggement this session)                     Current Attention Level: Sustained Memory: Decreased short-term memory   Safety/Judgement: Decreased awareness of deficits;Decreased awareness of safety   Problem Solving: Slow processing;Decreased initiation;Requires verbal cues;Difficulty sequencing;Requires tactile cues General Comments: pt very lethargic, difficult to get full engagement. Pt replies mainly iwth "yes" to all questions, other than once when she repeated "sleepy" after being asked if she was tired.      Exercises General Exercises - Upper Extremity Shoulder Flexion: Both;5 reps;AAROM General Exercises - Lower Extremity Ankle Circles/Pumps: AROM;Supine;Left;15 reps Quad Sets:  AROM;Left;Supine;5 reps(pt grimacing and moaning in pain, further mobility held at this time)    General Comments General comments (skin integrity, edema, etc.): pt with limited engagement today      Pertinent Vitals/Pain Pain Assessment: Faces Faces Pain Scale: Hurts little more Pain Location: R surgical site Pain Descriptors / Indicators: Grimacing;Guarding Pain Intervention(s): Limited activity within patient's tolerance;Monitored during  session;Repositioned    Home Living                      Prior Function            PT Goals (current goals can now be found in the care plan section) Acute Rehab PT Goals Patient Stated Goal: not stated PT Goal Formulation: With patient Time For Goal Achievement: 04/20/19 Potential to Achieve Goals: Fair Additional Goals Additional Goal #1: pt will propel WC 50' with min assist Progress towards PT goals: Progressing toward goals    Frequency    Min 2X/week      PT Plan Current plan remains appropriate    Co-evaluation              AM-PAC PT "6 Clicks" Mobility   Outcome Measure  Help needed turning from your back to your side while in a flat bed without using bedrails?: Total Help needed moving from lying on your back to sitting on the side of a flat bed without using bedrails?: Total Help needed moving to and from a bed to a chair (including a wheelchair)?: Total Help needed standing up from a chair using your arms (e.g., wheelchair or bedside chair)?: Total Help needed to walk in hospital room?: Total Help needed climbing 3-5 steps with a railing? : Total 6 Click Score: 6    End of Session Equipment Utilized During Treatment: (none) Activity Tolerance: Patient limited by pain;Patient limited by lethargy Patient left: in bed;with call bell/phone within reach;with bed alarm set Nurse Communication: Mobility status;Need for lift equipment Pain - Right/Left: Right Pain - part of body: Hip     Time: 6629-4765 PT Time Calculation (min) (ACUTE ONLY): 10 min  Charges:  $Therapeutic Exercise: 8-22 mins                     Rolm Baptise, PT, DPT   Acute Rehabilitation Department [phone]   Gaetana Michaelis 04/11/2019, 3:56 PM

## 2019-04-12 ENCOUNTER — Encounter (HOSPITAL_COMMUNITY): Payer: Self-pay | Admitting: Family Medicine

## 2019-04-12 ENCOUNTER — Inpatient Hospital Stay: Payer: Medicare HMO | Admitting: Infectious Diseases

## 2019-04-12 ENCOUNTER — Inpatient Hospital Stay (HOSPITAL_COMMUNITY): Payer: Medicare HMO | Admitting: Anesthesiology

## 2019-04-12 ENCOUNTER — Encounter (HOSPITAL_COMMUNITY)
Admission: EM | Disposition: A | Discharge status: Skilled Nursing Facility | Payer: Self-pay | Source: Skilled Nursing Facility | Attending: Internal Medicine

## 2019-04-12 ENCOUNTER — Inpatient Hospital Stay: Payer: Medicare HMO | Admitting: Internal Medicine

## 2019-04-12 DIAGNOSIS — L02415 Cutaneous abscess of right lower limb: Secondary | ICD-10-CM

## 2019-04-12 HISTORY — PX: HIP DISARTICULATION: SHX5851

## 2019-04-12 LAB — CBC WITH DIFFERENTIAL/PLATELET
Abs Immature Granulocytes: 1.03 10*3/uL — ABNORMAL HIGH (ref 0.00–0.07)
Basophils Absolute: 0.1 10*3/uL (ref 0.0–0.1)
Basophils Relative: 1 %
Eosinophils Absolute: 0.1 10*3/uL (ref 0.0–0.5)
Eosinophils Relative: 0 %
HCT: 30.7 % — ABNORMAL LOW (ref 36.0–46.0)
Hemoglobin: 10 g/dL — ABNORMAL LOW (ref 12.0–15.0)
Immature Granulocytes: 6 %
Lymphocytes Relative: 12 %
Lymphs Abs: 2.1 10*3/uL (ref 0.7–4.0)
MCH: 29 pg (ref 26.0–34.0)
MCHC: 32.6 g/dL (ref 30.0–36.0)
MCV: 89 fL (ref 80.0–100.0)
Monocytes Absolute: 0.8 10*3/uL (ref 0.1–1.0)
Monocytes Relative: 5 %
Neutro Abs: 13.2 10*3/uL — ABNORMAL HIGH (ref 1.7–7.7)
Neutrophils Relative %: 76 %
Platelets: 203 10*3/uL (ref 150–400)
RBC: 3.45 MIL/uL — ABNORMAL LOW (ref 3.87–5.11)
RDW: 19 % — ABNORMAL HIGH (ref 11.5–15.5)
WBC: 17.2 10*3/uL — ABNORMAL HIGH (ref 4.0–10.5)
nRBC: 0.2 % (ref 0.0–0.2)

## 2019-04-12 LAB — BASIC METABOLIC PANEL
Anion gap: 11 (ref 5–15)
BUN: 20 mg/dL (ref 8–23)
CO2: 22 mmol/L (ref 22–32)
Calcium: 7.7 mg/dL — ABNORMAL LOW (ref 8.9–10.3)
Chloride: 111 mmol/L (ref 98–111)
Creatinine, Ser: 0.67 mg/dL (ref 0.44–1.00)
GFR calc Af Amer: >60 mL/min (ref >60)
GFR calc non Af Amer: >60 mL/min (ref >60)
Glucose, Bld: 112 mg/dL — ABNORMAL HIGH (ref 70–99)
Potassium: 3.8 mmol/L (ref 3.5–5.1)
Sodium: 144 mmol/L (ref 135–145)

## 2019-04-12 LAB — GLUCOSE, CAPILLARY
Glucose-Capillary: 126 mg/dL — ABNORMAL HIGH (ref 70–99)
Glucose-Capillary: 98 mg/dL (ref 70–99)
Glucose-Capillary: 92 mg/dL (ref 70–99)

## 2019-04-12 LAB — PROTIME-INR
INR: 1.5 — ABNORMAL HIGH (ref 0.8–1.2)
Prothrombin Time: 18 seconds — ABNORMAL HIGH (ref 11.4–15.2)

## 2019-04-12 SURGERY — DISARTICULATION, HIP
Anesthesia: General | Site: Hip | Laterality: Right

## 2019-04-12 MED ORDER — FENTANYL CITRATE (PF) 100 MCG/2ML IJ SOLN
INTRAMUSCULAR | Status: DC | PRN
  Administered 2019-04-12 (×2): 50 ug via INTRAVENOUS

## 2019-04-12 MED ORDER — ACETAMINOPHEN 325 MG PO TABS: 325.0000 mg | ORAL_TABLET | Freq: Four times a day (QID) | ORAL | Status: DC | PRN

## 2019-04-12 MED ORDER — METOCLOPRAMIDE HCL 5 MG/ML IJ SOLN: 5.0000 mg | Freq: Three times a day (TID) | INTRAMUSCULAR | Status: DC | PRN

## 2019-04-12 MED ORDER — METHOCARBAMOL 1000 MG/10ML IJ SOLN
500.0000 mg | Freq: Four times a day (QID) | INTRAVENOUS | Status: DC | PRN
  Filled 2019-04-12: qty 5

## 2019-04-12 MED ORDER — ACETAMINOPHEN 325 MG PO TABS: 325.0000 mg | ORAL_TABLET | ORAL | Status: DC | PRN

## 2019-04-12 MED ORDER — METHOCARBAMOL 500 MG PO TABS: 500.0000 mg | ORAL_TABLET | Freq: Four times a day (QID) | ORAL | Status: DC | PRN

## 2019-04-12 MED ORDER — OXYCODONE HCL 5 MG/5ML PO SOLN: 5.0000 mg | Freq: Once | ORAL | Status: DC | PRN

## 2019-04-12 MED ORDER — SODIUM CHLORIDE 0.9 % IR SOLN
Status: DC | PRN
  Administered 2019-04-12: 3000 mL

## 2019-04-12 MED ORDER — SODIUM CHLORIDE 0.9 % IR SOLN
Status: DC | PRN
  Administered 2019-04-12: 1000 mL

## 2019-04-12 MED ORDER — ONDANSETRON HCL 4 MG PO TABS: 4.0000 mg | ORAL_TABLET | Freq: Four times a day (QID) | ORAL | Status: DC | PRN

## 2019-04-12 MED ORDER — LACTATED RINGERS IV SOLN: INTRAVENOUS | Status: DC | PRN

## 2019-04-12 MED ORDER — SODIUM CHLORIDE 0.9% IV SOLUTION: Freq: Once | INTRAVENOUS | Status: DC

## 2019-04-12 MED ORDER — FENTANYL CITRATE (PF) 100 MCG/2ML IJ SOLN
25.0000 ug | INTRAMUSCULAR | Status: DC | PRN
  Administered 2019-04-12 (×2): 50 ug via INTRAVENOUS

## 2019-04-12 MED ORDER — FENTANYL CITRATE (PF) 250 MCG/5ML IJ SOLN
INTRAMUSCULAR | Status: AC
  Filled 2019-04-12: qty 5

## 2019-04-12 MED ORDER — OXYCODONE HCL 5 MG PO TABS: 5.0000 mg | ORAL_TABLET | Freq: Once | ORAL | Status: DC | PRN

## 2019-04-12 MED ORDER — OXYCODONE HCL 5 MG PO TABS
5.0000 mg | ORAL_TABLET | ORAL | Status: DC | PRN
  Administered 2019-04-12: 21:00:00 10 mg via ORAL
  Filled 2019-04-12: qty 2

## 2019-04-12 MED ORDER — ONDANSETRON HCL 4 MG/2ML IJ SOLN: 4.0000 mg | Freq: Four times a day (QID) | INTRAMUSCULAR | Status: DC | PRN

## 2019-04-12 MED ORDER — DEXAMETHASONE SODIUM PHOSPHATE 4 MG/ML IJ SOLN
INTRAMUSCULAR | Status: DC | PRN
  Administered 2019-04-12: 5 mg via INTRAVENOUS

## 2019-04-12 MED ORDER — SODIUM CHLORIDE 0.9 % IV SOLN: INTRAVENOUS | Status: DC

## 2019-04-12 MED ORDER — MEPERIDINE HCL 25 MG/ML IJ SOLN: 6.2500 mg | INTRAMUSCULAR | Status: DC | PRN

## 2019-04-12 MED ORDER — POLYETHYLENE GLYCOL 3350 17 G PO PACK: 17.0000 g | PACK | Freq: Every day | ORAL | Status: DC | PRN

## 2019-04-12 MED ORDER — PROPOFOL 10 MG/ML IV BOLUS
INTRAVENOUS | Status: AC
  Filled 2019-04-12: qty 20

## 2019-04-12 MED ORDER — MAGNESIUM CITRATE PO SOLN: 1.0000 | Freq: Once | ORAL | Status: DC | PRN

## 2019-04-12 MED ORDER — ONDANSETRON HCL 4 MG/2ML IJ SOLN: 4.0000 mg | Freq: Once | INTRAMUSCULAR | Status: DC | PRN

## 2019-04-12 MED ORDER — ACETAMINOPHEN 160 MG/5ML PO SOLN: 325.0000 mg | ORAL | Status: DC | PRN

## 2019-04-12 MED ORDER — ONDANSETRON HCL 4 MG/2ML IJ SOLN
INTRAMUSCULAR | Status: DC | PRN
  Administered 2019-04-12: 4 mg via INTRAVENOUS

## 2019-04-12 MED ORDER — PHENYLEPHRINE HCL-NACL 10-0.9 MG/250ML-% IV SOLN
INTRAVENOUS | Status: DC | PRN
  Administered 2019-04-12: 30 ug/min via INTRAVENOUS

## 2019-04-12 MED ORDER — BISACODYL 10 MG RE SUPP: 10.0000 mg | Freq: Every day | RECTAL | Status: DC | PRN

## 2019-04-12 MED ORDER — LIDOCAINE HCL (CARDIAC) PF 100 MG/5ML IV SOSY
PREFILLED_SYRINGE | INTRAVENOUS | Status: DC | PRN
  Administered 2019-04-12: 40 mg via INTRAVENOUS

## 2019-04-12 MED ORDER — METOCLOPRAMIDE HCL 5 MG PO TABS: 5.0000 mg | ORAL_TABLET | Freq: Three times a day (TID) | ORAL | Status: DC | PRN

## 2019-04-12 MED ORDER — ALBUMIN HUMAN 5 % IV SOLN: INTRAVENOUS | Status: DC | PRN

## 2019-04-12 MED ORDER — HYDROMORPHONE HCL 1 MG/ML IJ SOLN: 0.5000 mg | INTRAMUSCULAR | Status: DC | PRN

## 2019-04-12 MED ORDER — OXYCODONE HCL 5 MG PO TABS: 10.0000 mg | ORAL_TABLET | ORAL | Status: DC | PRN

## 2019-04-12 MED ORDER — FENTANYL CITRATE (PF) 100 MCG/2ML IJ SOLN
INTRAMUSCULAR | Status: AC
  Filled 2019-04-12: qty 2

## 2019-04-12 MED ORDER — DOCUSATE SODIUM 100 MG PO CAPS: 100.0000 mg | ORAL_CAPSULE | Freq: Two times a day (BID) | ORAL | Status: DC

## 2019-04-12 MED ORDER — CEFAZOLIN SODIUM-DEXTROSE 2-4 GM/100ML-% IV SOLN
2.0000 g | INTRAVENOUS | Status: AC
  Administered 2019-04-12: 16:00:00 2 g via INTRAVENOUS
  Filled 2019-04-12: qty 100

## 2019-04-12 MED ORDER — LACTATED RINGERS IV SOLN: INTRAVENOUS | Status: DC

## 2019-04-12 MED ORDER — PROPOFOL 10 MG/ML IV BOLUS
INTRAVENOUS | Status: DC | PRN
  Administered 2019-04-12: 100 mg via INTRAVENOUS

## 2019-04-12 SURGICAL SUPPLY — 32 items
CANISTER WOUND CARE 500ML ATS (WOUND CARE) ×2 IMPLANT
COVER SURGICAL LIGHT HANDLE (MISCELLANEOUS) ×3 IMPLANT
DRAPE IMP U-DRAPE 54X76 (DRAPES) ×3 IMPLANT
DRAPE INCISE IOBAN 85X60 (DRAPES) ×4 IMPLANT
DRAPE ORTHO SPLIT 77X108 STRL (DRAPES) ×6
DRAPE SURG ORHT 6 SPLT 77X108 (DRAPES) ×2 IMPLANT
DRAPE U-SHAPE 47X51 STRL (DRAPES) ×3 IMPLANT
DRESSING PREVENA PLUS CUSTOM (GAUZE/BANDAGES/DRESSINGS) IMPLANT
DRSG PREVENA PLUS CUSTOM (GAUZE/BANDAGES/DRESSINGS) ×3
ELECT BLADE 6.5 EXT (BLADE) ×2 IMPLANT
ELECT REM PT RETURN 9FT ADLT (ELECTROSURGICAL) ×3
ELECTRODE REM PT RTRN 9FT ADLT (ELECTROSURGICAL) ×1 IMPLANT
GLOVE BIOGEL PI IND STRL 9 (GLOVE) ×1 IMPLANT
GLOVE BIOGEL PI INDICATOR 9 (GLOVE) ×2
GLOVE SURG ORTHO 9.0 STRL STRW (GLOVE) ×3 IMPLANT
GOWN STRL REUS W/ TWL XL LVL3 (GOWN DISPOSABLE) ×3 IMPLANT
GOWN STRL REUS W/TWL XL LVL3 (GOWN DISPOSABLE) ×6
HANDPIECE INTERPULSE COAX TIP (DISPOSABLE) ×3
KIT BASIN OR (CUSTOM PROCEDURE TRAY) ×3 IMPLANT
KIT TURNOVER KIT B (KITS) ×3 IMPLANT
MANIFOLD NEPTUNE II (INSTRUMENTS) ×3 IMPLANT
NS IRRIG 1000ML POUR BTL (IV SOLUTION) ×3 IMPLANT
PACK TOTAL JOINT (CUSTOM PROCEDURE TRAY) ×3 IMPLANT
PACK UNIVERSAL I (CUSTOM PROCEDURE TRAY) ×3 IMPLANT
PAD ARMBOARD 7.5X6 YLW CONV (MISCELLANEOUS) ×4 IMPLANT
SET HNDPC FAN SPRY TIP SCT (DISPOSABLE) IMPLANT
SPONGE LAP 18X18 RF (DISPOSABLE) ×8 IMPLANT
STAPLER VISISTAT 35W (STAPLE) ×3 IMPLANT
SUT ETHILON 2 0 PSLX (SUTURE) ×8 IMPLANT
SUT ETHILON 6 0 P 1 (SUTURE) ×2 IMPLANT
TOWEL GREEN STERILE (TOWEL DISPOSABLE) ×3 IMPLANT
TOWEL GREEN STERILE FF (TOWEL DISPOSABLE) ×3 IMPLANT

## 2019-04-12 NOTE — Progress Notes (Signed)
Pt received from PACU. VSS. Wound vac on R hip with good seal and 0 output in canister. Telemetry applied. Daughter [name] updated that pt was back from surgery.  Versie Starks, RN

## 2019-04-12 NOTE — Op Note (Signed)
04/12/2019  5:55 PM  PATIENT:  Robin Snow    PRE-OPERATIVE DIAGNOSIS:  ABSCESS RIGHT HIP DISARTICULATION  POST-OPERATIVE DIAGNOSIS:  Same  PROCEDURE:  REVISION RIGHT HIP DISARTICULATION Application of Prevena wound VAC.  SURGEON:  [name], MD  PHYSICIAN ASSISTANT:None ANESTHESIA:   General  PREOPERATIVE INDICATIONS:  Robin Snow is a  79 y.o. female with a diagnosis of ABSCESS RIGHT HIP DISARTICULATION who failed conservative measures and elected for surgical management.    The risks benefits and alternatives were discussed with the patient preoperatively including but not limited to the risks of infection, bleeding, nerve injury, cardiopulmonary complications, the need for revision surgery, among others, and the patient was willing to proceed.  OPERATIVE IMPLANTS: None  @ENCIMAGES @  OPERATIVE FINDINGS: Necrotic muscles extending into the pelvis further resected.  OPERATIVE PROCEDURE: Patient was brought to the operating room and underwent a general anesthetic.  After adequate levels anesthesia were obtained patient's right lower extremity was prepped using DuraPrep draped into a sterile field a timeout was called.  Elliptical incision was made around the previous surgical incision there was a large hematoma there is a large mass of necrotic muscle.  Extensive excisional debridement with a knife rondure was performed to excise necrotic muscle soft tissue fascia.  The vascular bundles were suture ligated with 2-0 silk further hemostasis was obtained with electrocautery.  The wound was irrigated with pulsatile lavage.  After extensive debridement all tissue margins were healthy and viable.  The wound was closed using 2-0 nylon and staples.  A Prevena wound VAC was applied this had a good suction fit patient was extubated taken to the PACU in stable condition.  Patient was started on a transfusion with 1 unit packed red blood cells.   DISCHARGE PLANNING:  Antibiotic  duration: Continue IV antibiotics  Weightbearing: Not applicable  Pain medication: Opioid pathway  Dressing care/ Wound VAC: Wound VAC right hip  Ambulatory devices: Not applicable  Discharge to: Anticipate discharge to skilled nursing.  Follow-up: In the office 1 week post operative.

## 2019-04-12 NOTE — Progress Notes (Signed)
Nutrition Follow up   DOCUMENTATION CODES:   Obesity unspecified, Severe malnutrition in context of acute illness/injury  INTERVENTION:   Recommend Cortrak placement with initiation of EN as PO intake remains poor. Will discuss with primary.    Continue Ensure Enlive po TID, each supplement provides 350 kcal and 20 grams of protein.  Continue Magic cup TID with meals, each supplement provides 290 kcal and 9 grams of protein.  Continue MVI with minerals daily.  NUTRITION DIAGNOSIS:   Severe Malnutrition related to acute illness(Recent AKA with wound dehiscence) as evidenced by moderate fat depletion, moderate muscle depletion.  GOAL:   Patient will meet greater than or equal to 90% of their needs  MONITOR:   PO intake, Supplement acceptance, Labs, Skin  REASON FOR ASSESSMENT:   Consult Wound healing  ASSESSMENT:   79 yo female admitted with R AKA dehiscence. S/P R hip disarticulation on 12/30. PMH includes A fib, HTN, CKD, DM, anemia, GIB, OSA, stroke.   12/30- R hip disarticulation   Pt for I&D of R hip today.   Seemed slightly confused upon RD follow up. Spoke with tech who reports pt's appetite fluctuates daily. Meal completions charted as 0-50% for pt's last eight meals (average 15%). Sipping on Ensure but complains it "gets stuck" in her throat. May need SLP evaluation. Given status of wounds recommend placement of Cortrak with initiation of EN to promote would healing as intake has been poor >1 week.   Admission weight: 102 kg  Current weight:  87.5 kg   I/O: -705 ml since admit UOP: 1,000 ml x 24 hrs   Drips: LR @ 10 ml/hr  Medications: colace, ferrous sulfate, senokot Labs: CBG 80-162   Diet Order:   Diet Order            Diet clear liquid Room service appropriate? Yes; Fluid consistency: Thin  Diet effective now              EDUCATION NEEDS:   Not appropriate for education at this time  Skin:  Skin Assessment: Skin Integrity Issues: Skin  Integrity Issues:: Stage II, Incisions, Other (Comment) Stage II: buttocks Incisions: R hip disarticulation with VAC Other: MASD to buttocks and perineum  Last BM:  1/3  Height:   Ht Readings from Last 1 Encounters:  04/01/19 5\' 7"  (1.702 m)    Weight:   Wt Readings from Last 1 Encounters:  04/05/19 87.5 kg    Ideal Body Weight:  50 kg(adjusted for R leg amputation)  BMI:  37.1 (adjusted for R leg amputation)  Estimated Nutritional Needs:   Kcal:  1900-2100  Protein:  100-120 gm  Fluid:  >/= 1.9 L   04/07/19 RD, LDN Clinical Nutrition Pager # [phone]

## 2019-04-12 NOTE — Progress Notes (Signed)
Patient for surgery today.  Is awake and answers questions but Other knowing she is in [place] has difficulty with references to time and place . Foul odor from hip. Discussed with Dr. Lajoyce Corners will get surgical consent from family

## 2019-04-12 NOTE — Transfer of Care (Signed)
Immediate Anesthesia Transfer of Care Note  Patient: Robin Snow  Procedure(s) Performed: REVISION RIGHT HIP DISARTICULATION (Right Hip)  Patient Location: PACU  Anesthesia Type:General  Level of Consciousness: drowsy and patient cooperative  Airway & Oxygen Therapy: Patient Spontanous Breathing and Patient connected to face mask oxygen  Post-op Assessment: Report given to RN and Post -op Vital signs reviewed and stable  Post vital signs: Reviewed and stable  Last Vitals:  Vitals Value Taken Time  BP 130/94 04/12/19 1744  Temp    Pulse 126 04/12/19 1751  Resp 14 04/12/19 1751  SpO2 96 % 04/12/19 1751  Vitals shown include unvalidated device data.  Last Pain:  Vitals:   04/12/19 1145  TempSrc: Axillary  PainSc:       Patients Stated Pain Goal: 4 (04/09/19 1255)  Complications: No apparent anesthesia complications

## 2019-04-12 NOTE — Anesthesia Preprocedure Evaluation (Addendum)
Anesthesia Evaluation  Patient identified by MRN, date of birth, ID band Patient awake    Reviewed: Allergy & Precautions, NPO status , Patient's Chart, lab work & pertinent test results, reviewed documented beta blocker date and time   History of Anesthesia Complications Negative for: history of anesthetic complications  Airway Mallampati: I  TM Distance: >3 FB Neck ROM: Full    Dental  (+) Edentulous Upper, Edentulous Lower   Pulmonary sleep apnea , former smoker,  03/31/2019 SARS coronavirus NEG   breath sounds clear to auscultation       Cardiovascular hypertension, Pt. on medications and Pt. on home beta blockers + angina + dysrhythmias Atrial Fibrillation + pacemaker  Rhythm:Regular Rate:Normal  03/08/2019 ECHO: EF 60 to 65%. mildly increased left ventricular hypertroph, normal RV, severely dilated LA, mildly dilated RA, mild MR, trivial TR  *Aortic valve is tricuspid with vegetation suspected on the noncoronary cusp, trivial AI   Neuro/Psych CVA    GI/Hepatic Neg liver ROS, GERD  Controlled,  Endo/Other  diabetesMorbid obesity  Renal/GU Renal InsufficiencyRenal disease     Musculoskeletal  (+) Arthritis , Osteoarthritis,    Abdominal (+) + obese,   Peds  Hematology  (+) Blood dyscrasia (Hb 9.5), anemia , Coumadin: INR 1.2 (03/31/2019)   Anesthesia Other Findings   Reproductive/Obstetrics                             Anesthesia Physical  Anesthesia Plan  ASA: IV  Anesthesia Plan: General   Post-op Pain Management:    Induction: Intravenous  PONV Risk Score and Plan: 3 and Ondansetron, Dexamethasone and Treatment may vary due to age or medical condition  Airway Management Planned: Oral ETT and LMA  Additional Equipment: None  Intra-op Plan:   Post-operative Plan: Extubation in OR  Informed Consent: I have reviewed the patients History and Physical, chart, labs and  discussed the procedure including the risks, benefits and alternatives for the proposed anesthesia with the patient or authorized representative who has indicated his/her understanding and acceptance.     Dental advisory given  Plan Discussed with: CRNA, Surgeon and Anesthesiologist  Anesthesia Plan Comments: (Plan routine monitors, GETA )       Anesthesia Quick Evaluation

## 2019-04-12 NOTE — Interval H&P Note (Signed)
History and Physical Interval Note:  04/12/2019 7:27 AM  Robin Snow  has presented today for surgery, with the diagnosis of ABSCESS RIGHT HIP DISARTICULATION.  The various methods of treatment have been discussed with the patient and family. After consideration of risks, benefits and other options for treatment, the patient has consented to  Procedure(s): REVISION RIGHT HIP DISARTICULATION (Right) as a surgical intervention.  The patient's history has been reviewed, patient examined, no change in status, stable for surgery.  I have reviewed the patient's chart and labs.  Questions were answered to the patient's satisfaction.     [name]

## 2019-04-12 NOTE — Anesthesia Procedure Notes (Signed)
Procedure Name: LMA Insertion Date/Time: 04/12/2019 4:20 PM Performed by: Tillman Abide, CRNA Pre-anesthesia Checklist: Patient identified, Emergency Drugs available, Suction available and Patient being monitored Patient Re-evaluated:Patient Re-evaluated prior to induction Oxygen Delivery Method: Circle System Utilized Preoxygenation: Pre-oxygenation with 100% oxygen Induction Type: IV induction Ventilation: Mask ventilation without difficulty LMA: LMA inserted LMA Size: 4.0 Number of attempts: 1 Airway Equipment and Method: Bite block Placement Confirmation: positive ETCO2 Tube secured with: Tape Dental Injury: Teeth and Oropharynx as per pre-operative assessment

## 2019-04-12 NOTE — Progress Notes (Signed)
This RN received a call from Blood bank asking if patient will need blood transfusion during surgery, M. Katherina Right MD notified, MD gave instructions that blood bank should prepare 2 units of RBC for patient in case blood will be needed during surgery, blood bank notified, will continue to monitor.

## 2019-04-12 NOTE — Progress Notes (Signed)
PROGRESS NOTE  Robin Snow AYT:[id number] DOB: July 06, 1940 DOA: 03/31/2019 PCP: [name], MD  Brief History   Robin Snow a 79 y.o.femalewith medical history significant ofa.fib, HTN, CKD, stroke. Patient had R TKA in [place] in 2019. Patient recently admitted from 10/30-12/23 for dislocation of R knee prosthesis with septic joint. Closed reduction by Dr. 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 Ultimately underwent R AKA by Dr. Lajoyce Corners. This complicated by sepsis from E.Coli bacteremia, spent several days in ICU on vent. Also complicated by AKA wound dehissance.Patient finally discharged 12/23 to [facility] health care on rocephin to complete 6 weeks of therapy with end date of 1/6.   Robin Snow full recounting of the prolonged, nearly 2 month hospital stay, please see Dr. Dennison Nancy discharge summary on 12/23.  Per SNF: patient didn't get rocephinon(12/24). At around 7pm they noticed asmall amount ofbloodydrainage from dressingand noticedfoulodor from wound. At 745pm noticed dressing was saturated and at 9pm now oozing blood around dressing. Patient was noted to be running fever of 100.7 at facility. Facility confirms that coumadin is still on hold as per DC summary plan.  No fever in ED, lab work actually looks fairly stable compared to 2 days ago.  CT of RLE femur is worrisome for hematoma with probable superimposed infection extending from amputation site.She was admitted under hospital service with Ortho to consult. She was given Rocephin and vancomycin in ED. She is scheduled to have right hip disarticulation with Dr. Lajoyce Corners.  She underwent hip disarticulation in the OR on 04/05/19 and postoperatively she had some hemorrhagic shock and became hypotensive received 3 units of albumin Neo-Synephrine as well as 3 units of PRBCs and was transferred to the intensive care unit for further monitoring.  She  is now hemodynamically stable and improved and has been transferred to PCU.  She is much more awake and alert and does have some anemia which we will continue to monitor and is stabilized.  PT OT recommending skilled nursing facility.  I spoke with orthopedics Dr. Lajoyce Corners who recommends resuming her home anticoagulation and she will be started on a heparin drip with a Coumadin bridge given her elevated CHA2DS2-VASc score of 7.  Need to closely monitor her anticoagulation as her platelet count dropped a little bit but her hemoglobin has remained stable.  Orthopedic surgery has transfused the patient with 2 units PRBC's in preparation to take her back to the OR on 04/11/2018 for repeat debridement. She underwent this debridement today with placement of the wound vac.   Social work is in the process of assisting with placement back to [facility].  We will repeat her Covid test in the interim as she may be discharged the next 4 to 48 hours stable  Consultants  . Orthopedic surgery . Infectious disease . PCCM  Procedures  . Disarticulation of right hip with dehiscence and infection of stump . Wound VAC placement  Antibiotics   Anti-infectives (From admission, onward)   Start     Dose/Rate Route Frequency Ordered Stop   04/13/19 0600  ceFAZolin (ANCEF) IVPB 2g/100 mL premix     2 g 200 mL/hr over 30 Minutes Intravenous On call to O.R. 04/12/19 1214 04/12/19 1629   04/10/19 2000  vancomycin (VANCOREADY) IVPB 1750 mg/350 mL  Status:  Discontinued     1,750 mg 175 mL/hr over 120 Minutes Intravenous Every 24 hours 04/10/19 0847 04/10/19 0850   04/10/19 2000  vancomycin (VANCOREADY) IVPB 1250 mg/250 mL  Status:  Discontinued     1,250 mg 166.7 mL/hr over 90 Minutes Intravenous Every 24 hours 04/10/19 0850 04/10/19 0943   04/09/19 2000  vancomycin (VANCOREADY) IVPB 1500 mg/300 mL  Status:  Discontinued     1,500 mg 150 mL/hr over 120 Minutes Intravenous Every 24 hours 04/09/19 1916 04/10/19 0847    04/09/19 2000  [MAR Hold]  ceFEPIme (MAXIPIME) 2 g in sodium chloride 0.9 % 100 mL IVPB     (MAR Hold since Wed 04/12/2019 at 1230.Hold Reason: Transfer to a Procedural area.)   2 g 200 mL/hr over 30 Minutes Intravenous Every 8 hours 04/09/19 1916     04/05/19 1400  ceFAZolin (ANCEF) IVPB 2g/100 mL premix     2 g 200 mL/hr over 30 Minutes Intravenous On call to O.R. 04/05/19 0723 04/05/19 1100   04/01/19 0500  cefTRIAXone (ROCEPHIN) IVPB  Status:  Discontinued    Note to Pharmacy: Indication:  E Coli Endocarditis Last Day of Therapy:  04/12/2019 Labs - Once weekly:  CBC/D and BMP, Labs - Every other week:  ESR and CRP     2 g Intravenous Every 24 hours 03/31/19 0436 03/31/19 0500   04/01/19 0400  cefTRIAXone (ROCEPHIN) 2 g in sodium chloride 0.9 % 100 mL IVPB  Status:  Discontinued     2 g 200 mL/hr over 30 Minutes Intravenous Every 24 hours 03/31/19 0501 04/09/19 1916   03/31/19 0400  vancomycin (VANCOCIN) IVPB 1000 mg/200 mL premix     1,000 mg 200 mL/hr over 60 Minutes Intravenous  Once 03/31/19 0356 03/31/19 0606   03/31/19 0100  cefTRIAXone (ROCEPHIN) 2 g in sodium chloride 0.9 % 100 mL IVPB     2 g 200 mL/hr over 30 Minutes Intravenous  Once 03/31/19 0100 03/31/19 0329     Subjective  The patient is resting comfortably. No new complaints. Foul smelling.  Objective   Vitals:  Vitals:   04/12/19 1000 04/12/19 1145  BP: (!) 95/49 (!) 101/47  Pulse: 60 60  Resp: 17 18  Temp:  98.5 F (36.9 C)  SpO2: 99% 99%   Exam:  Constitutional:  . The patient is resting quietly. No acute distress. Respiratory:  . No increased work of breathing. . No wheezes, rales, or rhonchi . No tactile fremitus Cardiovascular:  . Regular rate and rhythm . No murmurs, ectopy, or gallups. . No lateral PMI. No thrills. Abdomen:  . Abdomen is soft, non-tender, non-distended . No hernias, masses, or organomegaly . Normoactive bowel sounds.  Musculoskeletal:  . No cyanosis, clubbing, or edema .  Foul smelling right hip wound Skin:  . No rashes, lesions, ulcers . palpation of skin: no induration or nodules . Wound VAC in place. Neurologic:  . CN 2-12 intact . Sensation all 4 extremities intact Psychiatric:  . Mental status o Mood, affect appropriate o Orientation to person, place, time  . judgment and insight appear intact  I have personally reviewed the following:   Today's Data  . Vitals, CMP, CBC  Scheduled Meds: . [MAR Hold] sodium chloride   Intravenous Once  . sodium chloride   Intravenous Once  . [MAR Hold] amiodarone  200 mg Oral Daily  . [MAR Hold] Chlorhexidine Gluconate Cloth  6 each Topical Daily  . [MAR Hold] diltiazem  120 mg Oral Daily  . [MAR Hold] docusate  100 mg Oral BID  . [MAR Hold] feeding supplement (ENSURE ENLIVE)  237 mL Oral TID BM  . [MAR Hold] ferrous sulfate  300 mg Oral BID WC  . [MAR Hold] gabapentin  300 mg Oral Q12H  . [MAR Hold] metoprolol tartrate  25 mg Oral BID  . [MAR Hold] multivitamin with minerals  1 tablet Oral Daily  . [MAR Hold] pantoprazole  40 mg Oral Daily  . [MAR Hold] pravastatin  20 mg Oral q1800  . [MAR Hold] senna-docusate  2 tablet Oral BID  . [MAR Hold] sodium chloride flush  10-40 mL Intracatheter Q12H  . [MAR Hold] sodium chloride flush  3 mL Intravenous Once   Continuous Infusions: . sodium chloride 75 mL/hr at 04/12/19 1014  . [MAR Hold] ceFEPime (MAXIPIME) IV 2 g (04/12/19 0644)  . lactated ringers 10 mL/hr at 04/12/19 1250    Principal Problem:   Wound dehiscence Active Problems:   Essential hypertension   AF (paroxysmal atrial fibrillation) (HCC)   History of bacteremia due to Escherichia coli   S/P AKA (above knee amputation), right (HCC)   Cellulitis of right lower extremity   Dehiscence of amputation stump (HCC)   Fever   LOS: 12 days   A & P  Wound dehiscence/infection of amputation site/right stump status post hip disarticulation on 04/06/2019 postoperative day 5: The patient has a foul  smell and swelling at stump site. Last evening she became febrile, tachypneic and tachycardic. She appeared septic. She was given IV fluids for this. Narcotic pain control was held due to patient lethargy. Infectious disease was consulted. Plans are for surgery to take him back for repeat I&D on 04/11/2018. ID has been consulted. The patient remains on IV cefepime at this time. The patient's hemoglobin again dropped abruptly this morning to 5.5 and is receiving another 2 units of PRBC's to make a total of 5 units PRBC's transfused so far. Wound VAC in in place with thick bloody effluent from wound indicating continue muscle breakdown. Per orthopedic surgery the patient had extensive necrotic muscles including muscles originating in the pelvis and tissue were sent for cultures and patient will need long-term antibiotics. The patient required transfer to ICU for post hemorrhagic shock after the surgery. She is now in the progressive care unit. She is receiving  IV Dilaudid 0.5 mg every 4 as needed for severe pain, p.o. oxycodone IR and oxycodone acetaminophen 1 tab p.o. twice daily as needed for pain control. Bowel regimen in place. Surgical pathology results showed a gangrenous necrosis of skin and soft tissue with atheroma formation with a luminal stenosis and dystrophic calcifications of vasculature and it stated that the gangrenous necrosis is extensive soft tissue resection margin. She has been converted from Rocephin to Cefepime IV. Infectious disease has been consulted as WBC continues to increase. Patient's VTE prophylaxis with coumadin is being bridged with heparin, but this has been held due to recent drop in hemoglobin. CT abdomen and pelvis has been performed on 04/08/2018. It has demonstrated mottled air and heterogeneous fluid in the operative bed extending from the hip joint space to the skin surface. Small peripherally enhancing fluid collections just deep to the incision inferiorly, largest collection  measures 3.4 cm greatest dimension. While findings may be related to recent surgery, sterility is indeterminate by imaging, and mottled air may represent infection. Heterogeneous enlargement and mottled air involving the right obturator, gluteal and iliopsoas muscles, may be postoperative or infectious. There is no evidence of active bleeding. Also body wall edema has slightly increased from prior exam. Blood cultures were repeated. The patient has returned to the OR with orthopedic surgery today for  repeat debridement. She is being transfused with one unit of PRBC's. Wound VAC is placed.  Acute Blood Loss Anemia from Surgical Intervention: Overnight hemoglobin dropped from 7.7 to 5.5 this morning. She was given 2 units PRBC's this morning in addition to the 3 units she had received earlier in this stay. Patient has thick bloody effluent from Wounds Vac. This is the presumed source of blood loss. Iron deficiency is being supplemented, and hemoglobin will continue to be monitored as necessary. Heparin has been held. It was being used to bridge until the patient was therapeutic on coumadin. CHADSVASC2 score is 7. She received 2 units of PRBC's in preparation for return to OR. She is being transfused with one unit of PRBC's post-operatively as well.  E. coli Bacteremia/Endocarditis: She has been converted to IV cefepime. I appreciate  Infectious disease's assistance.   Permanent Atrial Fibrillation : Heart rates have been relatively low today ranging from 59 to 60. I will reduce dose of metoprolol. Heparin has been held due to abrupt drop in hemoglobin. Monitor heart rates and electrolytes. CHA2DS2-VASc of 7.  Essential Hypertension: Blood pressures normotensive today after blood transfusion and reduction of metoprolol dose. Monitor.   Obesity with severe malnutrition in the context of acute illness/injury: Complicates all cares. Dietary has been consulted. They have recommended  Enlive p.o. 3 times  daily along with Magic cup 3 times daily with meals and multivitamin with minerals daily as well as liberalizing diet to help improve p.o. intake. Will place cortrak ans request dietician to make recommendation for tube feeds to optimally provide for healing.  Hyperlipidemia: Continue Pravastatin 20 g p.o. nightly.  GERD: Continue Pantoprazole 40 mg p.o. daily.  Hypomagnesemia: 1.7 today. Monitor and replace as necessary.  Hyperbilirubinemia: Resolved and likely reactive. Monitor intermittently.   Leukocytosis, worsening: Due to complicated wound situation. Reactive in the setting of Surgical intervention and Hip Disarticulation. Pt is receiving IV cefepime. Will repeat echocardiogram as per infectious disease's recommendations. Repeat I & D on 04/11/2018.   Thrombocytopenia: Patient's platelet count dropped from 159 > 124 > 179 >165>178. Monitor.   Somnolence and Lethargy: Encephalopathy due to sepsis related to ongoing infectious issues related to right hip disarticulation/wound infectious. CT head was checked and did not demonstrate any acute pathology.   I have seen and examined this patient myself. I have spent 32 minutes in her evaluation and care.  DVT prophylaxis: SCDs. Heparin had been used to bridge the patient while he was becoming therapeutic on coumadin. However, it has been stopped given blood loss anemia.  Code Status: FULL CODE  Family Communication: No family present at bedside  Disposition Plan: SNF When bed available  Marg Macmaster, DO [facility] Direct contact: see [email or web address]  7PM-7AM contact night coverage as above 04/12/2019, 6:08 PM  LOS: 10 days

## 2019-04-13 ENCOUNTER — Inpatient Hospital Stay (HOSPITAL_COMMUNITY): Payer: Medicare HMO

## 2019-04-13 ENCOUNTER — Encounter: Payer: Self-pay | Admitting: *Deleted

## 2019-04-13 LAB — CBC WITH DIFFERENTIAL/PLATELET
Abs Immature Granulocytes: 1.1 10*3/uL — ABNORMAL HIGH (ref 0.00–0.07)
Basophils Absolute: 0.1 10*3/uL (ref 0.0–0.1)
Basophils Relative: 0 %
Eosinophils Absolute: 0 10*3/uL (ref 0.0–0.5)
Eosinophils Relative: 0 %
HCT: 25.3 % — ABNORMAL LOW (ref 36.0–46.0)
Hemoglobin: 8.3 g/dL — ABNORMAL LOW (ref 12.0–15.0)
Immature Granulocytes: 5 %
Lymphocytes Relative: 6 %
Lymphs Abs: 1.4 10*3/uL (ref 0.7–4.0)
MCH: 28.6 pg (ref 26.0–34.0)
MCHC: 32.8 g/dL (ref 30.0–36.0)
MCV: 87.2 fL (ref 80.0–100.0)
Monocytes Absolute: 0.7 10*3/uL (ref 0.1–1.0)
Monocytes Relative: 3 %
Neutro Abs: 19.9 10*3/uL — ABNORMAL HIGH (ref 1.7–7.7)
Neutrophils Relative %: 86 %
Platelets: 157 10*3/uL (ref 150–400)
RBC: 2.9 MIL/uL — ABNORMAL LOW (ref 3.87–5.11)
RDW: 17.3 % — ABNORMAL HIGH (ref 11.5–15.5)
WBC: 23.2 10*3/uL — ABNORMAL HIGH (ref 4.0–10.5)
nRBC: 0 % (ref 0.0–0.2)

## 2019-04-13 LAB — PHOSPHORUS: Phosphorus: 2.7 mg/dL (ref 2.5–4.6)

## 2019-04-13 LAB — BASIC METABOLIC PANEL
Anion gap: 6 (ref 5–15)
BUN: 19 mg/dL (ref 8–23)
CO2: 24 mmol/L (ref 22–32)
Calcium: 7.7 mg/dL — ABNORMAL LOW (ref 8.9–10.3)
Chloride: 114 mmol/L — ABNORMAL HIGH (ref 98–111)
Creatinine, Ser: 0.78 mg/dL (ref 0.44–1.00)
GFR calc Af Amer: >60 mL/min (ref >60)
GFR calc non Af Amer: >60 mL/min (ref >60)
Glucose, Bld: 143 mg/dL — ABNORMAL HIGH (ref 70–99)
Potassium: 4.2 mmol/L (ref 3.5–5.1)
Sodium: 144 mmol/L (ref 135–145)

## 2019-04-13 LAB — PROTIME-INR
INR: 1.6 — ABNORMAL HIGH (ref 0.8–1.2)
Prothrombin Time: 18.5 seconds — ABNORMAL HIGH (ref 11.4–15.2)

## 2019-04-13 LAB — GLUCOSE, CAPILLARY
Glucose-Capillary: 99 mg/dL (ref 70–99)
Glucose-Capillary: 103 mg/dL — ABNORMAL HIGH (ref 70–99)

## 2019-04-13 LAB — LACTIC ACID, PLASMA
Lactic Acid, Venous: 0.8 mmol/L (ref 0.5–1.9)
Lactic Acid, Venous: 0.7 mmol/L (ref 0.5–1.9)

## 2019-04-13 LAB — MAGNESIUM: Magnesium: 1.6 mg/dL — ABNORMAL LOW (ref 1.7–2.4)

## 2019-04-13 MED ORDER — OSMOLITE 1.5 CAL PO LIQD
1000.0000 mL | ORAL | Status: DC
  Administered 2019-04-14: 17:00:00 1000 mL
  Filled 2019-04-13 (×5): qty 1000

## 2019-04-13 MED ORDER — PRO-STAT SUGAR FREE PO LIQD
30.0000 mL | Freq: Three times a day (TID) | ORAL | Status: DC
  Administered 2019-04-14 – 2019-04-17 (×9): 30 mL
  Filled 2019-04-13 (×10): qty 30

## 2019-04-13 MED ORDER — JUVEN PO PACK
1.0000 | PACK | Freq: Two times a day (BID) | ORAL | Status: DC
  Administered 2019-04-15 – 2019-04-17 (×5): 1
  Filled 2019-04-13 (×8): qty 1

## 2019-04-13 MED ORDER — MAGNESIUM SULFATE 2 GM/50ML IV SOLN
2.0000 g | Freq: Once | INTRAVENOUS | Status: AC
  Administered 2019-04-13: 2 g via INTRAVENOUS
  Filled 2019-04-13: qty 50

## 2019-04-13 MED ORDER — SODIUM CHLORIDE 0.9 % IV BOLUS
500.0000 mL | Freq: Once | INTRAVENOUS | Status: AC
  Administered 2019-04-13: 12:00:00 500 mL via INTRAVENOUS

## 2019-04-13 NOTE — Progress Notes (Signed)
PROGRESS NOTE  TILISHA TUFFY ZOX:[id number] DOB: 04-03-1941 DOA: 03/31/2019 PCP: [name], MD  Brief History   [name] a 79 y.o.femalewith medical history significant ofa.fib, HTN, CKD, stroke. Patient had R TKA in [place] in 2019. Patient recently admitted from 10/30-12/23 for dislocation of R knee prosthesis with septic joint. Closed reduction by Dr. 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 Ultimately underwent R AKA by Dr. Lajoyce Corners. This complicated by sepsis from E.Coli bacteremia, spent several days in ICU on vent. Also complicated by AKA wound dehissance.Patient finally discharged 12/23 to [facility] health care on rocephin to complete 6 weeks of therapy with end date of 1/6.   [name] full recounting of the prolonged, nearly 2 month hospital stay, please see Dr. Dennison Nancy discharge summary on 12/23.  Per SNF: patient didn't get rocephinon(12/24). At around 7pm they noticed asmall amount ofbloodydrainage from dressingand noticedfoulodor from wound. At 745pm noticed dressing was saturated and at 9pm now oozing blood around dressing. Patient was noted to be running fever of 100.7 at facility. Facility confirms that coumadin is still on hold as per DC summary plan.  No fever in ED, lab work actually looks fairly stable compared to 2 days ago.  CT of RLE femur is worrisome for hematoma with probable superimposed infection extending from amputation site.She was admitted under hospital service with Ortho to consult. She was given Rocephin and vancomycin in ED. She is scheduled to have right hip disarticulation with Dr. Lajoyce Corners.  She underwent hip disarticulation in the OR on 04/05/19 and postoperatively she had some hemorrhagic shock and became hypotensive received 3 units of albumin Neo-Synephrine as well as 3 units of PRBCs and was transferred to the intensive care unit for further monitoring.  She  is now hemodynamically stable and improved and has been transferred to PCU.  She is much more awake and alert and does have some anemia which we will continue to monitor and is stabilized.  PT OT recommending skilled nursing facility.  I spoke with orthopedics Dr. Lajoyce Corners who recommends resuming her home anticoagulation and she will be started on a heparin drip with a Coumadin bridge given her elevated CHA2DS2-VASc score of 7.  Need to closely monitor her anticoagulation as her platelet count dropped a little bit but her hemoglobin has remained stable.  Orthopedic surgery has transfused the patient with 2 units PRBC's in preparation to take her back to the OR on 04/11/2018 for repeat debridement. She underwent this debridement today with placement of the wound vac.   Consultants  . Orthopedic surgery . Infectious disease . PCCM  Procedures  . Disarticulation of right hip with dehiscence and infection of stump . Wound VAC placement  Antibiotics   Anti-infectives (From admission, onward)   Start     Dose/Rate Route Frequency Ordered Stop   04/13/19 0600  ceFAZolin (ANCEF) IVPB 2g/100 mL premix     2 g 200 mL/hr over 30 Minutes Intravenous On call to O.R. 04/12/19 1214 04/12/19 1629   04/10/19 2000  vancomycin (VANCOREADY) IVPB 1750 mg/350 mL  Status:  Discontinued     1,750 mg 175 mL/hr over 120 Minutes Intravenous Every 24 hours 04/10/19 0847 04/10/19 0850   04/10/19 2000  vancomycin (VANCOREADY) IVPB 1250 mg/250 mL  Status:  Discontinued     1,250 mg 166.7 mL/hr over 90 Minutes Intravenous Every 24 hours 04/10/19 0850 04/10/19 0943   04/09/19 2000  vancomycin (VANCOREADY) IVPB 1500 mg/300 mL  Status:  Discontinued     1,500 mg 150 mL/hr over 120 Minutes Intravenous Every 24 hours 04/09/19 1916 04/10/19 0847   04/09/19 2000  ceFEPIme (MAXIPIME) 2 g in sodium chloride 0.9 % 100 mL IVPB     2 g 200 mL/hr over 30 Minutes Intravenous Every 8 hours 04/09/19 1916     04/05/19 1400  ceFAZolin  (ANCEF) IVPB 2g/100 mL premix     2 g 200 mL/hr over 30 Minutes Intravenous On call to O.R. 04/05/19 0723 04/05/19 1100   04/01/19 0500  cefTRIAXone (ROCEPHIN) IVPB  Status:  Discontinued    Note to Pharmacy: Indication:  E Coli Endocarditis Last Day of Therapy:  04/12/2019 Labs - Once weekly:  CBC/D and BMP, Labs - Every other week:  ESR and CRP     2 g Intravenous Every 24 hours 03/31/19 0436 03/31/19 0500   04/01/19 0400  cefTRIAXone (ROCEPHIN) 2 g in sodium chloride 0.9 % 100 mL IVPB  Status:  Discontinued     2 g 200 mL/hr over 30 Minutes Intravenous Every 24 hours 03/31/19 0501 04/09/19 1916   03/31/19 0400  vancomycin (VANCOCIN) IVPB 1000 mg/200 mL premix     1,000 mg 200 mL/hr over 60 Minutes Intravenous  Once 03/31/19 0356 03/31/19 0606   03/31/19 0100  cefTRIAXone (ROCEPHIN) 2 g in sodium chloride 0.9 % 100 mL IVPB     2 g 200 mL/hr over 30 Minutes Intravenous  Once 03/31/19 0100 03/31/19 0329     Subjective  The patient is sleeping soundly. She is not responsive to noxious stimuli.  Objective   Vitals:  Vitals:   04/13/19 1509 04/13/19 1601  BP: 130/78 123/86  Pulse: 91 99  Resp: 14 12  Temp:  97.7 F (36.5 C)  SpO2: 98% 100%   Exam:  Constitutional:  . The patient is sleeping deeply. She is not responsive to noxious stimuli. No acute distress. Respiratory:  . No increased work of breathing. . No wheezes, rales, or rhonchi . No tactile fremitus Cardiovascular:  . Regular rate and rhythm . No murmurs, ectopy, or gallups. . No lateral PMI. No thrills. Abdomen:  . Abdomen is soft, non-tender, non-distended . No hernias, masses, or organomegaly . Normoactive bowel sounds.  Musculoskeletal:  . No cyanosis, clubbing, or edema . Foul smelling right hip wound Skin:  . No rashes, lesions, ulcers . palpation of skin: no induration or nodules . Wound VAC in place. Neurologic:  . CN 2-12 intact . Sensation all 4 extremities intact Psychiatric:  . Mental  status o Mood, affect appropriate o Orientation to person, place, time  . judgment and insight appear intact  I have personally reviewed the following:   Today's Data  . Vitals, CMP, CBC  Scheduled Meds: . sodium chloride   Intravenous Once  . sodium chloride   Intravenous Once  . amiodarone  200 mg Oral Daily  . Chlorhexidine Gluconate Cloth  6 each Topical Daily  . diltiazem  120 mg Oral Daily  . docusate  100 mg Oral BID  . [START ON 04/14/2019] feeding supplement (PRO-STAT SUGAR FREE 64)  30 mL Per Tube TID  . ferrous sulfate  300 mg Oral BID WC  . gabapentin  300 mg Oral Q12H  . metoprolol tartrate  25 mg Oral BID  . multivitamin with minerals  1 tablet Oral Daily  . [START ON 04/14/2019] nutrition supplement (JUVEN)  1 packet Per Tube BID BM  . pantoprazole  40 mg Oral Daily  .  pravastatin  20 mg Oral q1800  . senna-docusate  2 tablet Oral BID  . sodium chloride flush  10-40 mL Intracatheter Q12H  . sodium chloride flush  3 mL Intravenous Once   Continuous Infusions: . sodium chloride 10 mL/hr at 04/12/19 2033  . ceFEPime (MAXIPIME) IV 2 g (04/13/19 1458)  . [START ON 04/14/2019] feeding supplement (OSMOLITE 1.5 CAL)    . lactated ringers 10 mL/hr at 04/12/19 1250  . magnesium sulfate bolus IVPB    . methocarbamol (ROBAXIN) IV      Principal Problem:   Wound dehiscence Active Problems:   Essential hypertension   AF (paroxysmal atrial fibrillation) (HCC)   History of bacteremia due to Escherichia coli   S/P AKA (above knee amputation), right (HCC)   Cellulitis of right lower extremity   Dehiscence of amputation stump (HCC)   Fever   LOS: 13 days   A & P  Wound dehiscence/infection of amputation site/right stump status post hip disarticulation on 04/06/2019 postoperative day 5: The patient has a foul smell and swelling at stump site. Last evening she became febrile, tachypneic and tachycardic. She appeared septic. She was given IV fluids for this. Narcotic pain  control was held due to patient lethargy. Infectious disease was consulted. Plans are for surgery to take him back for repeat I&D on 04/11/2018. ID has been consulted. The patient remains on IV cefepime at this time. The patient's hemoglobin again dropped abruptly this morning to 5.5 and is receiving another 2 units of PRBC's to make a total of 5 units PRBC's transfused so far. Wound VAC in in place with thick bloody effluent from wound indicating continue muscle breakdown. Per orthopedic surgery the patient had extensive necrotic muscles including muscles originating in the pelvis and tissue were sent for cultures and patient will need long-term antibiotics. The patient required transfer to ICU for post hemorrhagic shock after the surgery. She is now in the progressive care unit. She is receiving  IV Dilaudid 0.5 mg every 4 as needed for severe pain, p.o. oxycodone IR and oxycodone acetaminophen 1 tab p.o. twice daily as needed for pain control. Bowel regimen in place. Surgical pathology results showed a gangrenous necrosis of skin and soft tissue with atheroma formation with a luminal stenosis and dystrophic calcifications of vasculature and it stated that the gangrenous necrosis is extensive soft tissue resection margin. She has been converted from Rocephin to Cefepime IV. Infectious disease has been consulted as WBC continues to increase. Patient's VTE prophylaxis with coumadin is being bridged with heparin, but this has been held due to recent drop in hemoglobin. CT abdomen and pelvis has been performed on 04/08/2018. It has demonstrated mottled air and heterogeneous fluid in the operative bed extending from the hip joint space to the skin surface. Small peripherally enhancing fluid collections just deep to the incision inferiorly, largest collection measures 3.4 cm greatest dimension. While findings may be related to recent surgery, sterility is indeterminate by imaging, and mottled air may represent infection.  Heterogeneous enlargement and mottled air involving the right obturator, gluteal and iliopsoas muscles, may be postoperative or infectious. There is no evidence of active bleeding. Also body wall edema has slightly increased from prior exam. Blood cultures were repeated. The patient has returned to the OR with orthopedic surgery today for repeat debridement. She is being transfused with one unit of PRBC's. Wound VAC is placed.  Sepsis: Pt with decreased level of consciousness this morning with hypothermia, bradycardia, and hypotension. Leukocytosis of 23.2.[name]  Improved consciousness with iV fluid bolus. Monitor.  Acute Blood Loss Anemia from Surgical Intervention: Overnight hemoglobin dropped from 7.7 to 5.5 this morning. She was given 2 units PRBC's this morning in addition to the 3 units she had received earlier in this stay. Patient has thick bloody effluent from Wounds Vac. This is the presumed source of blood loss. Iron deficiency is being supplemented, and hemoglobin will continue to be monitored as necessary. Heparin has been held. It was being used to bridge until the patient was therapeutic on coumadin. CHADSVASC2 score is 7. She received 2 units of PRBC's in preparation for return to OR. She is being transfused with one unit of PRBC's post-operatively as well. Post op hemoglobin was 10.2. Today this is decreased to 8.3. Monitor and transfuse as necessary.  E. coli Bacteremia/Endocarditis: She has been converted to IV cefepime. I appreciate  Infectious disease's assistance.   Permanent Atrial Fibrillation : Heart rates have been relatively low today ranging from 59 to 60. I will reduce dose of metoprolol. Heparin has been held due to abrupt drop in hemoglobin. Monitor heart rates and electrolytes. CHA2DS2-VASc of 7.  Essential Hypertension: Blood pressures normotensive today after blood transfusion and reduction of metoprolol dose. Monitor.   Obesity with severe malnutrition in the context of  acute illness/injury: Complicates all cares. Dietary has been consulted. They have recommended  Enlive p.o. 3 times daily along with Magic cup 3 times daily with meals and multivitamin with minerals daily as well as liberalizing diet to help improve p.o. intake. Will place cortrak ans request dietician to make recommendation for tube feeds to optimally provide for healing.  Hyperlipidemia: Continue Pravastatin 20 g p.o. nightly.  GERD: Continue Pantoprazole 40 mg p.o. daily.  Hypomagnesemia: 1.7 today. Monitor and replace as necessary.  Hyperbilirubinemia: Resolved and likely reactive. Monitor intermittently.   Leukocytosis, worsening: Due to complicated wound situation. Reactive in the setting of Surgical intervention and Hip Disarticulation. Pt is receiving IV cefepime. Will repeat echocardiogram as per infectious disease's recommendations. Repeat I & D on 04/11/2018.   Thrombocytopenia: Patient's platelet count dropped from 159 > 124 > 179 >165>178>157. Monitor.   Somnolence and Lethargy: Encephalopathy due to sepsis related to ongoing infectious issues related to right hip disarticulation/wound infectious. CT head was checked and did not demonstrate any acute pathology.   I have seen and examined this patient myself. I have spent 30 minutes in her evaluation and care.  DVT prophylaxis: SCDs. Heparin had been used to bridge the patient while he was becoming therapeutic on coumadin. However, it has been stopped given blood loss anemia.  Code Status: FULL CODE  Family Communication: No family present at bedside  Disposition Plan: SNF When bed available  Elyjah Hazan, DO [facility] Direct contact: see [email or web address]  7PM-7AM contact night coverage as above 04/13/2019, 4:44 PM  LOS: 10 days

## 2019-04-13 NOTE — Evaluation (Signed)
Physical Therapy Evaluation Patient Details Name: CHANCI OJALA MRN: [id number] DOB: [birth year] Today's Date: 04/13/2019   History of Present Illness  79 y.o.femalewith medical history significant ofa.fib, HTN, CKD, stroke.  Clinical Impression  Pt presents to PT s/p operation for R hip disarticulation abscess. Pt is lethargic and demonstrates limited ability to follow commands, becoming alert with verbal cues but unable to follow motor commands at this time. Pt demonstrates grossly limited ROM in LLE with ~5 degree knee flexion contracture and hip assessment limited by pain. Pt requires totalA for all mobility at this time. PT/OT assisted in repositioning in bed and provided education on family positioning in room to prevent possible neck contractures due to pt posture. Pt will benefit from acute PT POC to reduce caregiver burden and to improve tolerance for functional mobility.     Follow Up Recommendations SNF;Supervision/Assistance - 24 hour    Equipment Recommendations  (defer to post-acute)    Recommendations for Other Services       Precautions / Restrictions Precautions Precautions: Fall Precaution Comments: wound vac R hip disarticulation Restrictions Weight Bearing Restrictions: No Other Position/Activity Restrictions: not applicable per Orthopedic note      Mobility  Bed Mobility Overal bed mobility: Needs Assistance Bed Mobility: Rolling Rolling: Total assist         General bed mobility comments: pt assisted into long sitting with totalA x2  Transfers                    Ambulation/Gait                Stairs            Wheelchair Mobility    Modified Rankin (Stroke Patients Only)       Balance Overall balance assessment: Needs assistance Sitting-balance support: Bilateral upper extremity supported;Feet supported Sitting balance-Leahy Scale: Zero Sitting balance - Comments: totalA of 2 in long sitting for 20  seconds Postural control: Posterior lean                                   Pertinent Vitals/Pain Pain Assessment: Faces Faces Pain Scale: Hurts whole lot Pain Location: R surgical site Pain Descriptors / Indicators: Grimacing;Guarding Pain Intervention(s): Limited activity within patient's tolerance    Home Living                        Prior Function                 Hand Dominance        Extremity/Trunk Assessment                Communication      Cognition Arousal/Alertness: Lethargic Behavior During Therapy: Flat affect Overall Cognitive Status: Difficult to assess                                        General Comments General comments (skin integrity, edema, etc.): VSS, pt on RA    Exercises     Assessment/Plan    PT Assessment    PT Problem List         PT Treatment Interventions      PT Goals (Current goals can be found in the Care Plan section)  Acute Rehab PT Goals Patient Stated Goal:  not stated PT Goal Formulation: With patient/family Time For Goal Achievement: 04/27/19 Potential to Achieve Goals: Fair    Frequency Min 2X/week   Barriers to discharge        Co-evaluation PT/OT/SLP Co-Evaluation/Treatment: Yes Reason for Co-Treatment: Complexity of the patient's impairments (multi-system involvement);Necessary to address cognition/behavior during functional activity;For patient/therapist safety;To address functional/ADL transfers PT goals addressed during session: Mobility/safety with mobility;Strengthening/ROM         AM-PAC PT "6 Clicks" Mobility  Outcome Measure Help needed turning from your back to your side while in a flat bed without using bedrails?: Total Help needed moving from lying on your back to sitting on the side of a flat bed without using bedrails?: Total Help needed moving to and from a bed to a chair (including a wheelchair)?: Total Help needed standing up from a  chair using your arms (e.g., wheelchair or bedside chair)?: Total Help needed to walk in hospital room?: Total Help needed climbing 3-5 steps with a railing? : Total 6 Click Score: 6    End of Session Equipment Utilized During Treatment: (none) Activity Tolerance: Patient limited by pain;Patient limited by lethargy Patient left: in bed;with call bell/phone within reach;with family/visitor present;with bed alarm set Nurse Communication: Mobility status;Need for lift equipment PT Visit Diagnosis: Other abnormalities of gait and mobility (R26.89);Pain Pain - Right/Left: Right Pain - part of body: Hip    Time: 6834-1962 PT Time Calculation (min) (ACUTE ONLY): 19 min   Charges:   PT Evaluation $PT Re-evaluation: 1 Re-eval          Zenaida Niece, PT, DPT Acute Rehabilitation Pager: [phone]   Zenaida Niece 04/13/2019, 12:47 PM

## 2019-04-13 NOTE — Anesthesia Postprocedure Evaluation (Signed)
Anesthesia Post Note  Patient: Robin Snow  Procedure(s) Performed: REVISION RIGHT HIP DISARTICULATION (Right Hip)     Patient location during evaluation: PACU Anesthesia Type: General Level of consciousness: awake and alert Pain management: pain level controlled Vital Signs Assessment: post-procedure vital signs reviewed and stable Respiratory status: spontaneous breathing, nonlabored ventilation, respiratory function stable and patient connected to nasal cannula oxygen Cardiovascular status: blood pressure returned to baseline and stable Postop Assessment: no apparent nausea or vomiting Anesthetic complications: no    Last Vitals:  Vitals:   04/13/19 1100 04/13/19 1200  BP: 107/61 103/75  Pulse: 60   Resp: 16 20  Temp:  (!) 36.1 C  SpO2: 100%     Last Pain:  Vitals:   04/13/19 1200  TempSrc: Rectal  PainSc:                  [name]

## 2019-04-13 NOTE — Progress Notes (Addendum)
Rapid Response  Time arrived: 1230  Overview: Second set of eyes. Called non-emergently to round on patient. Patient noted by RN to be more lethargic, minimally responsive when aroused.   Assessment: Eye fluttering and tremor in arms, chest, and upper thigh noted. Rectal temperature 96.80F. Pt able to move all extremities. Eyes squint equally when I try to pry eye lids open. Pt does not respond to orientation question. Pt does lift her head from pillow, as if to involve herself in conversation. Wound vac drainage is serosanguinous, no odor noted in room. BP 107/61, HR 60 paced, RR 16, SpO2 100%. Pt in no distress. 2+/3+ edema noted in patient's upper extremities and periorbital edema noted.  Primary RN had already notified MD upon my arrival. Orders received for 500 cc bolus, lactic acid, CT head, palliative consulted.   RN to notify attending in regards to patient's mentation. Consider nutrition for patient. Consider albumin for third spacing. GOC consideration. RN to call rapid response for further needs.

## 2019-04-13 NOTE — Progress Notes (Signed)
Sleeping comfortable. VAC working [employer].  In [facility].

## 2019-04-13 NOTE — Progress Notes (Signed)
Nutrition Follow up   DOCUMENTATION CODES:   Obesity unspecified, Severe malnutrition in context of acute illness/injury  INTERVENTION:   Once Cortrak placed:  -Osmolite 1.5 @ 20 ml/hr -Increase by 10 ml Q6 hours to goal rate of 50 ml/hr (1200 ml) -30 ml Prostat TID -Juven BID per tube    Provides: 2100 kcals, 120 grams protein, 914 ml free water.   NUTRITION DIAGNOSIS:   Severe Malnutrition related to acute illness(Recent AKA with wound dehiscence) as evidenced by moderate fat depletion, moderate muscle depletion.  Ongoing   GOAL:   Patient will meet greater than or equal to 90% of their needs   Not meeting PO- TF to start tomorrow   MONITOR:   PO intake, Supplement acceptance, Labs, Skin  REASON FOR ASSESSMENT:   Consult Wound healing  ASSESSMENT:   79 yo female admitted with R AKA dehiscence. S/P R hip disarticulation on 12/30. PMH includes A fib, HTN, CKD, DM, anemia, GIB, OSA, stroke.   12/30- R hip disarticulation  1/6- s/p revision of R hip disarticulation, wound VAC  Pt lethargic this am. Refusing medications, meals, and supplements. Cortrak ordered for tomorrow. Discussed plan to start TF with daughter at bedside. Monitor for refeeding as intake has been poor since admit.  Will d/c supplements as she is refusing. PMT consulted for goals of care.   Admission weight: 102 kg  Current weight:  87.5 kg   I/O: -370 ml since admit UOP: 450 ml x 24 hrs    Medications: colace, ferrous sulfate, senokot, MVI with minerals  Labs: CBG 92-126  Diet Order:   Diet Order            Diet Carb Modified Fluid consistency: Thin; Room service appropriate? Yes  Diet effective now              EDUCATION NEEDS:   Not appropriate for education at this time  Skin:  Skin Assessment: Skin Integrity Issues: Skin Integrity Issues:: Stage II, Incisions, Other (Comment) Stage II: buttocks Incisions: R hip disarticulation with VAC Other: MASD to buttocks and  perineum  Last BM:  1/3  Height:   Ht Readings from Last 1 Encounters:  04/01/19 5\' 7"  (1.702 m)    Weight:   Wt Readings from Last 1 Encounters:  04/05/19 87.5 kg    Ideal Body Weight:  50 kg(adjusted for R leg amputation)  BMI:  37.1 (adjusted for R leg amputation)  Estimated Nutritional Needs:   Kcal:  1900-2100  Protein:  100-120 gm  Fluid:  >/= 1.9 L   04/07/19 RD, LDN Clinical Nutrition Pager # [phone]

## 2019-04-13 NOTE — Progress Notes (Signed)
Pt very lethargic. Minimial eye fluttering w/ sternal rub. Unable to take oral medications. BP 92/61 (72). Swayze, DO paged via AMION. No new orders received.  Versie Starks, RN

## 2019-04-13 NOTE — Progress Notes (Signed)
[facility]  Date of Admission:  03/31/2019      Total days of antibiotics 41  Day 5 cefepime         ASSESSMENT: Robin Snow is a 79 y.o. female with known E coli infection of right prosthetic knee with extension up to right thigh musculature now s/p hip disarticulation.  Dr. Lajoyce Corners took her back to the operating room yesterday for further debridement of wound and found to have another large hematoma and extensive necrosis of the muscle. She has remained afebrile since 01/03. Still with high volume drainage from wound. Unfortunately no samples sent from OR to help guide further antibiotic decisions. Plan to continue IV cefepime for now through next wound check to make further determinations of length of therapy. Not sure she is reliably swallowing pills with her being so sleepy at this time.    PLAN: 1. Continue cefepime  2. Follow cultures taken in OR 1/6   Principal Problem:   Wound dehiscence Active Problems:   History of bacteremia due to Escherichia coli   Essential hypertension   AF (paroxysmal atrial fibrillation) (HCC)   S/P AKA (above knee amputation), right (HCC)   Cellulitis of right lower extremity   Dehiscence of amputation stump (HCC)   Fever   . sodium chloride   Intravenous Once  . sodium chloride   Intravenous Once  . amiodarone  200 mg Oral Daily  . Chlorhexidine Gluconate Cloth  6 each Topical Daily  . diltiazem  120 mg Oral Daily  . docusate  100 mg Oral BID  . feeding supplement (ENSURE ENLIVE)  237 mL Oral TID BM  . ferrous sulfate  300 mg Oral BID WC  . gabapentin  300 mg Oral Q12H  . metoprolol tartrate  25 mg Oral BID  . multivitamin with minerals  1 tablet Oral Daily  . pantoprazole  40 mg Oral Daily  . pravastatin  20 mg Oral q1800  . senna-docusate  2 tablet Oral BID  . sodium chloride flush  10-40 mL Intracatheter Q12H  . sodium chloride flush  3 mL Intravenous Once    SUBJECTIVE: Sleeping  comfortably in bed. Daughter [name] is at the bedside. Reports she has been sleeping a lot since surgery yesterday and did not eat well this morning. Has not mentioned any specific concerns to her daughter.   Review of Systems: Review of Systems  Unable to perform ROS: Other  Patient sleeping - difficult to wake her  Allergies  Allergen Reactions  . Aspirin Hives and Swelling    Angioedema   . Rofecoxib Hives  . Hydrocodone Hives    Tolerates oxycodone and tramadol    OBJECTIVE: Vitals:   04/13/19 0400 04/13/19 0500 04/13/19 0700 04/13/19 0800  BP: (!) 107/55 120/69 (!) 93/55 (!) 100/56  Pulse: 60 (!) 59  60  Resp: 12 10 13 16   Temp: 97.9 F (36.6 C)   (!) 97.3 F (36.3 C)  TempSrc: Axillary   Axillary  SpO2: 100% 100%  100%  Weight:      Height:       Body mass index is 30.21 kg/m.   Physical Exam Constitutional:      General: She is not in acute distress.    Comments: She is sleeping soundly. Difficult to wake with loud voice.   Cardiovascular:     Rate and Rhythm: Normal rate and regular rhythm.     Heart sounds:  No murmur.  Pulmonary:     Effort: Pulmonary effort is normal.     Breath sounds: Normal breath sounds.  Abdominal:     Palpations: Abdomen is soft.  Musculoskeletal:     Comments: R hip wound vac with ~ 800cc serosanguinous drainage.   Skin:    General: Skin is warm and dry.     Capillary Refill: Capillary refill takes less than 2 seconds.     Lab Results Lab Results  Component Value Date   WBC 23.2 (H) 04/13/2019   HGB 8.3 (L) 04/13/2019   HCT 25.3 (L) 04/13/2019   MCV 87.2 04/13/2019   PLT 157 04/13/2019    Lab Results  Component Value Date   CREATININE 0.78 04/13/2019   BUN 19 04/13/2019   NA 144 04/13/2019   K 4.2 04/13/2019   CL 114 (H) 04/13/2019   CO2 24 04/13/2019    Lab Results  Component Value Date   ALT 18 04/10/2019   AST 29 04/10/2019   ALKPHOS 81 04/10/2019   BILITOT 0.7 04/10/2019     Microbiology: Recent  Results (from the past 240 hour(s))  Surgical pcr screen     Status: None   Collection Time: 04/04/19 11:13 PM   Specimen: Nasal Mucosa; Nasal Swab  Result Value Ref Range Status   MRSA, PCR NEGATIVE NEGATIVE Final   Staphylococcus aureus NEGATIVE NEGATIVE Final    Comment: (NOTE) The Xpert SA Assay (FDA approved for NASAL specimens in patients 49 years of age and older), is one component of a comprehensive surveillance program. It is not intended to diagnose infection nor to guide or monitor treatment. Performed at [facility], [place] [address] [place], [place] [address]   Culture, blood (routine x 2)     Status: None (Preliminary result)   Collection Time: 04/09/19  8:06 PM   Specimen: BLOOD  Result Value Ref Range Status   Specimen Description BLOOD LEFT ANTECUBITAL  Final   Special Requests   Final    BOTTLES DRAWN AEROBIC AND ANAEROBIC Blood Culture results may not be optimal due to an inadequate volume of blood received in culture bottles   Culture   Final    NO GROWTH 3 DAYS Performed at [facility], [place] [address] [place], [place] [address]    Report Status PENDING  Incomplete  Culture, blood (routine x 2)     Status: None (Preliminary result)   Collection Time: 04/09/19  8:16 PM   Specimen: BLOOD  Result Value Ref Range Status   Specimen Description BLOOD LEFT ANTECUBITAL  Final   Special Requests   Final    BOTTLES DRAWN AEROBIC AND ANAEROBIC Blood Culture results may not be optimal due to an inadequate volume of blood received in culture bottles   Culture   Final    NO GROWTH 3 DAYS Performed at [facility], [place] [address], [place], [place] [address]    Report Status PENDING  Incomplete     Janene Madeira, MSN, NP-C [email or web address] Pager: [phone] Office: [phone] [place]: [phone]

## 2019-04-13 NOTE — Progress Notes (Signed)
Occupational Therapy Treatment Patient Details Name: Robin Snow MRN: [id number] DOB: [birth year] Today's Date: 04/13/2019    History of present illness 79 y.o.femalewith medical history significant ofa.fib, HTN, CKD, stroke.   OT comments  Patient semi-supine upon arrival. Patient lethargic, opens eyes and responds to UE/LE exercise with increased response to pain with movement of L LE and R residual limb. Attempt long sitting in bed, patient able to tolerate for approximately 20 seconds with max A x2 and posterior lean. Continue to recommend SNF setting due to assist level with self care tasks, functional mobility.    Follow Up Recommendations  SNF;Supervision/Assistance - 24 hour    Equipment Recommendations  Other (comment)(defer to next venue)       Precautions / Restrictions Precautions Precautions: Fall Precaution Comments: wound vac R hip disarticulation Required Braces or Orthoses: Knee Immobilizer - Right Knee Immobilizer - Right: On at all times Restrictions Weight Bearing Restrictions: No Other Position/Activity Restrictions: not applicable per Orthopedic note       Mobility Bed Mobility Overal bed mobility: Needs Assistance Bed Mobility: Rolling Rolling: Total assist         General bed mobility comments: pt assisted into long sitting with totalA x2  Transfers Overall transfer level: Needs assistance               General transfer comment: unable    Balance Overall balance assessment: Needs assistance Sitting-balance support: Bilateral upper extremity supported;Feet supported Sitting balance-Leahy Scale: Zero Sitting balance - Comments: totalA of 2 in long sitting for 20 seconds Postural control: Posterior lean                                 ADL either performed or assessed with clinical judgement   ADL Overall ADL's : Needs assistance/impaired Eating/Feeding: Total assistance;Bed level   Grooming: Total assistance;Bed  level Grooming Details (indicate cue type and reason): limited by cognition, weakness, activity tolerance. unable to follow simple commands such as squeeze my fingers Upper Body Bathing: Total assistance   Lower Body Bathing: Total assistance   Upper Body Dressing : Total assistance   Lower Body Dressing: Total assistance;Bed level   Toilet Transfer: Total assistance   Toileting- Clothing Manipulation and Hygiene: Total assistance;Bed level       Functional mobility during ADLs: Total assistance General ADL Comments: total for all adls               Cognition Arousal/Alertness: Lethargic Behavior During Therapy: Flat affect Overall Cognitive Status: Difficult to assess                                          Exercises Exercises: General Upper Extremity General Exercises - Upper Extremity Elbow Flexion: Both;10 reps;AAROM;Supine Elbow Extension: AAROM;10 reps;Supine;Both Digit Composite Flexion: 5 reps;Both;AAROM;Supine Composite Extension: 5 reps;PROM;Supine;Both      General Comments VSS, pt on RA    Pertinent Vitals/ Pain       Pain Assessment: Faces Faces Pain Scale: Hurts whole lot Pain Location: R surgical site Pain Descriptors / Indicators: Grimacing;Guarding Pain Intervention(s): Limited activity within patient's tolerance;Monitored during session         Frequency  Min 2X/week        Progress Toward Goals  OT Goals(current goals can now be found in the care plan section)  Progress  towards OT goals: Not progressing toward goals - comment(patient very lethargic this session, limited participation)  Acute Rehab OT Goals Patient Stated Goal: not stated OT Goal Formulation: Patient unable to participate in goal setting Time For Goal Achievement: 04/21/19 Potential to Achieve Goals: Fair ADL Goals Pt Will Perform Eating: with mod assist;bed level Pt Will Perform Grooming: with mod assist;bed level Pt Will Perform Upper Body  Bathing: with mod assist;bed level Pt Will Perform Lower Body Bathing: with max assist;bed level Pt Will Perform Upper Body Dressing: with mod assist;sitting Pt Will Perform Lower Body Dressing: with max assist;with adaptive equipment;sitting/lateral leans;sit to/from stand;bed level Pt Will Transfer to Toilet: with max assist;with +2 assist;bedside commode;with transfer board Pt Will Perform Toileting - Clothing Manipulation and hygiene: with max assist;sitting/lateral leans;bed level Pt/caregiver will Perform Home Exercise Program: Increased strength;Increased ROM;Both right and left upper extremity;With minimal assist;With written HEP provided Additional ADL Goal #1: Pt will tolerate EOB for 5 minutes with mod (A) as precursor to adls  Plan Discharge plan remains appropriate    Co-evaluation    PT/OT/SLP Co-Evaluation/Treatment: Yes Reason for Co-Treatment: Complexity of the patient's impairments (multi-system involvement);For patient/therapist safety;To address functional/ADL transfers PT goals addressed during session: Mobility/safety with mobility;Strengthening/ROM OT goals addressed during session: ADL's and self-care;Strengthening/ROM      AM-PAC OT "6 Clicks" Daily Activity     Outcome Measure   Help from another person eating meals?: Total Help from another person taking care of personal grooming?: Total Help from another person toileting, which includes using toliet, bedpan, or urinal?: Total Help from another person bathing (including washing, rinsing, drying)?: Total Help from another person to put on and taking off regular upper body clothing?: Total Help from another person to put on and taking off regular lower body clothing?: Total 6 Click Score: 6    End of Session  OT Visit Diagnosis: Unsteadiness on feet (R26.81);Muscle weakness (generalized) (M62.81);Pain Pain - Right/Left: Right Pain - part of body: Leg   Activity Tolerance Patient limited by  lethargy;Patient limited by fatigue   Patient Left in bed;with call bell/phone within reach;with bed alarm set;with family/visitor present           Time: 7846-9629 OT Time Calculation (min): 25 min  Charges: OT General Charges $OT Visit: 1 Visit OT Treatments $Therapeutic Exercise: 8-22 mins  Myrtie Neither OT OT office: [phone]   Carmelia Roller 04/13/2019, 2:48 PM

## 2019-04-14 ENCOUNTER — Ambulatory Visit: Payer: Self-pay | Admitting: General Practice

## 2019-04-14 ENCOUNTER — Inpatient Hospital Stay (HOSPITAL_COMMUNITY): Payer: Medicare HMO

## 2019-04-14 DIAGNOSIS — R531 Weakness: Secondary | ICD-10-CM | POA: Insufficient documentation

## 2019-04-14 DIAGNOSIS — Z515 Encounter for palliative care: Secondary | ICD-10-CM

## 2019-04-14 LAB — TYPE AND SCREEN
ABO/RH(D): A POS
Antibody Screen: NEGATIVE
Donor AG Type: NEGATIVE
Donor AG Type: NEGATIVE
Donor AG Type: NEGATIVE
Donor AG Type: NEGATIVE
Donor AG Type: NEGATIVE
Donor AG Type: NEGATIVE
Unit division: 0
Unit division: 0
Unit division: 0
Unit division: 0
Unit division: 0
Unit division: 0
Unit division: 0
Unit division: 0

## 2019-04-14 LAB — BPAM RBC
Blood Product Expiration Date: 202101152359
Blood Product Expiration Date: 202101242359
Blood Product Expiration Date: 202101262359
Blood Product Expiration Date: 202101262359
Blood Product Expiration Date: 202101282359
Blood Product Expiration Date: 202101292359
Blood Product Expiration Date: 202101302359
Blood Product Expiration Date: 202101302359
ISSUE DATE / TIME: 202101041105
ISSUE DATE / TIME: 202101041430
ISSUE DATE / TIME: 202101051111
ISSUE DATE / TIME: 202101051402
ISSUE DATE / TIME: 202101061651
ISSUE DATE / TIME: 202101061651
Unit Type and Rh: 6200
Unit Type and Rh: 6200
Unit Type and Rh: 6200
Unit Type and Rh: 6200
Unit Type and Rh: 6200
Unit Type and Rh: 6200
Unit Type and Rh: 6200
Unit Type and Rh: 9500

## 2019-04-14 LAB — CBC WITH DIFFERENTIAL/PLATELET
Abs Immature Granulocytes: 0.78 10*3/uL — ABNORMAL HIGH (ref 0.00–0.07)
Basophils Absolute: 0 10*3/uL (ref 0.0–0.1)
Basophils Relative: 0 %
Eosinophils Absolute: 0 10*3/uL (ref 0.0–0.5)
Eosinophils Relative: 0 %
HCT: 22.4 % — ABNORMAL LOW (ref 36.0–46.0)
Hemoglobin: 7.1 g/dL — ABNORMAL LOW (ref 12.0–15.0)
Immature Granulocytes: 4 %
Lymphocytes Relative: 8 %
Lymphs Abs: 1.8 10*3/uL (ref 0.7–4.0)
MCH: 28.4 pg (ref 26.0–34.0)
MCHC: 31.7 g/dL (ref 30.0–36.0)
MCV: 89.6 fL (ref 80.0–100.0)
Monocytes Absolute: 1.3 10*3/uL — ABNORMAL HIGH (ref 0.1–1.0)
Monocytes Relative: 6 %
Neutro Abs: 17.2 10*3/uL — ABNORMAL HIGH (ref 1.7–7.7)
Neutrophils Relative %: 82 %
Platelets: 181 10*3/uL (ref 150–400)
RBC: 2.5 MIL/uL — ABNORMAL LOW (ref 3.87–5.11)
RDW: 17.8 % — ABNORMAL HIGH (ref 11.5–15.5)
WBC: 21.1 10*3/uL — ABNORMAL HIGH (ref 4.0–10.5)
nRBC: 0 % (ref 0.0–0.2)

## 2019-04-14 LAB — GLUCOSE, CAPILLARY
Glucose-Capillary: 83 mg/dL (ref 70–99)
Glucose-Capillary: 84 mg/dL (ref 70–99)
Glucose-Capillary: 86 mg/dL (ref 70–99)
Glucose-Capillary: 87 mg/dL (ref 70–99)

## 2019-04-14 LAB — MAGNESIUM
Magnesium: 2.4 mg/dL (ref 1.7–2.4)
Magnesium: 2.2 mg/dL (ref 1.7–2.4)

## 2019-04-14 LAB — CULTURE, BLOOD (ROUTINE X 2)
Culture: NO GROWTH
Culture: NO GROWTH

## 2019-04-14 LAB — BASIC METABOLIC PANEL
Anion gap: 8 (ref 5–15)
BUN: 20 mg/dL (ref 8–23)
CO2: 23 mmol/L (ref 22–32)
Calcium: 7.7 mg/dL — ABNORMAL LOW (ref 8.9–10.3)
Chloride: 115 mmol/L — ABNORMAL HIGH (ref 98–111)
Creatinine, Ser: 0.81 mg/dL (ref 0.44–1.00)
GFR calc Af Amer: >60 mL/min (ref >60)
GFR calc non Af Amer: >60 mL/min (ref >60)
Glucose, Bld: 86 mg/dL (ref 70–99)
Potassium: 3.7 mmol/L (ref 3.5–5.1)
Sodium: 146 mmol/L — ABNORMAL HIGH (ref 135–145)

## 2019-04-14 LAB — PHOSPHORUS
Phosphorus: 2.7 mg/dL (ref 2.5–4.6)
Phosphorus: 2.8 mg/dL (ref 2.5–4.6)

## 2019-04-14 LAB — PROTIME-INR
INR: 1.7 — ABNORMAL HIGH (ref 0.8–1.2)
Prothrombin Time: 19.6 seconds — ABNORMAL HIGH (ref 11.4–15.2)

## 2019-04-14 MED ORDER — ACETAMINOPHEN 160 MG/5ML PO SOLN
650.0000 mg | Freq: Three times a day (TID) | ORAL | Status: DC
  Administered 2019-04-14 – 2019-04-17 (×8): 650 mg via ORAL
  Filled 2019-04-14 (×8): qty 20.3

## 2019-04-14 MED ORDER — CEFAZOLIN SODIUM-DEXTROSE 2-4 GM/100ML-% IV SOLN
2.0000 g | Freq: Three times a day (TID) | INTRAVENOUS | Status: DC
  Administered 2019-04-14 – 2019-04-17 (×10): 2 g via INTRAVENOUS
  Filled 2019-04-14 (×11): qty 100

## 2019-04-14 MED ORDER — DILTIAZEM HCL-DEXTROSE 125-5 MG/125ML-% IV SOLN (PREMIX)
5.0000 mg/h | INTRAVENOUS | Status: DC
  Administered 2019-04-14: 22:00:00 5 mg/h via INTRAVENOUS
  Administered 2019-04-15: 14:00:00 10 mg/h via INTRAVENOUS
  Filled 2019-04-14 (×4): qty 125

## 2019-04-14 MED ORDER — OXYCODONE HCL 5 MG PO TABS
10.0000 mg | ORAL_TABLET | ORAL | Status: DC | PRN
  Administered 2019-04-14 – 2019-04-15 (×2): 10 mg via ORAL
  Filled 2019-04-14 (×2): qty 2

## 2019-04-14 MED ORDER — OXYCODONE HCL 5 MG PO TABS: 5.0000 mg | ORAL_TABLET | ORAL | Status: DC | PRN

## 2019-04-14 MED ORDER — DILTIAZEM LOAD VIA INFUSION
10.0000 mg | Freq: Once | INTRAVENOUS | Status: AC
  Administered 2019-04-14: 22:00:00 10 mg via INTRAVENOUS
  Filled 2019-04-14: qty 10

## 2019-04-14 NOTE — Procedures (Signed)
Cortrak  Person Inserting Tube:  Ugochukwu Chichester, RD Tube Type:  Cortrak - 43 inches Tube Location:  Right nare Initial Placement:  Stomach Secured by: Bridle Technique Used to Measure Tube Placement:  Documented cm marking at nare/ corner of mouth Cortrak Secured At:  69 cm   X-ray is required, abdominal x-ray has been ordered by the Cortrak team. Please confirm tube placement before using the Cortrak tube.   If the tube becomes dislodged please keep the tube and contact the Cortrak team at [email or web address] (password [email or web address]) for replacement.  If after hours and replacement cannot be delayed, place a NG tube and confirm placement with an abdominal x-ray.   Vanessa Kick RD, LDN Clinical Nutrition Pager # [phone]

## 2019-04-14 NOTE — Progress Notes (Addendum)
Tube feedings started per order. Pt tolerated well, still very lethargic. Minimal eye fluttering in response to pain. VSS. Temp 97.9, BP 121/74, HR ranging between 115-125, O2 sat 100% room air. Will continue to monitor.  Hazle Nordmann, RN

## 2019-04-14 NOTE — Progress Notes (Addendum)
[facility]  Date of Admission:  03/31/2019      Total days of antibiotics 42  Day 6 cefepime         ASSESSMENT: Robin Snow is a 79 y.o. female with known E coli infection of right prosthetic knee with extension up to right thigh musculature now s/p hip disarticulation.   On exam this morning she does has a rhythmic twitching bilaterally with upper extremeties and eye fluttering. She purposefully resists in an attempt to open her eyes and move upper limbs. She also withdraws and grimaces to pain when palpating vac incision line. 600cc out from vac last 24h. Discussed with Dr. [name] - her mental status waxed and waned yesterday as well. Head CT reviewed from yesterday and unremarkable.   Cefepime can cause neurologic sequela (encephalopathy, myoclonus, confusion). While it is a rare occurrence, will give consideration to this and narrow her to cefazolin for now. May need neurology consult if she does not improve. It is unclear to [place] whether she even needs ongoing antibiotic therapy at this time without any occult evidence of ongoing infection with her most recent surgery; however will plan to continue until next wound check from ortho team and further plans/concerns they have.    She has had severe and recurrent infections requiring lengthy antibiotics and multiple trips to the OR now with worsening mental status and unable to make her own decisions. Given complex medical issues and significant life altering surgery may be helpful to consider goals of care as she seems to be doing poorly in the global context of her health.    PLAN:  Change to Cefazolin    Principal Problem:   Wound dehiscence Active Problems:   History of bacteremia due to Escherichia coli   Essential hypertension   AF (paroxysmal atrial fibrillation) (HCC)   S/P AKA (above knee amputation), right (HCC)   Cellulitis of right lower extremity   Dehiscence of amputation  stump (HCC)   Fever   . sodium chloride   Intravenous Once  . sodium chloride   Intravenous Once  . amiodarone  200 mg Oral Daily  . Chlorhexidine Gluconate Cloth  6 each Topical Daily  . diltiazem  120 mg Oral Daily  . docusate  100 mg Oral BID  . feeding supplement (PRO-STAT SUGAR FREE 64)  30 mL Per Tube TID  . ferrous sulfate  300 mg Oral BID WC  . gabapentin  300 mg Oral Q12H  . metoprolol tartrate  25 mg Oral BID  . multivitamin with minerals  1 tablet Oral Daily  . nutrition supplement (JUVEN)  1 packet Per Tube BID BM  . pantoprazole  40 mg Oral Daily  . pravastatin  20 mg Oral q1800  . senna-docusate  2 tablet Oral BID  . sodium chloride flush  10-40 mL Intracatheter Q12H  . sodium chloride flush  3 mL Intravenous Once    SUBJECTIVE: Not waking this morning. Fluttering eyes with tactile stimulation.   Review of Systems: Review of Systems  Unable to perform ROS: Other  Patient will not wake  Allergies  Allergen Reactions  . Aspirin Hives and Swelling    Angioedema   . Rofecoxib Hives  . Hydrocodone Hives    Tolerates oxycodone and tramadol    OBJECTIVE: Vitals:   04/13/19 2023 04/14/19 0013 04/14/19 0431 04/14/19 0802  BP: (!) 108/45 115/65 120/66 118/70  Pulse: (!) 102 (!) 103 99  99  Resp: 13 14 12 14   Temp: 98.4 F (36.9 C) 98.4 F (36.9 C) 98.2 F (36.8 C) 98.1 F (36.7 C)  TempSrc: Oral Oral Oral Axillary  SpO2: 100% 100% 100% 98%  Weight:      Height:       Body mass index is 30.21 kg/m.   Physical Exam Constitutional:      General: She is not in acute distress.    Comments: Difficult to wake with loud voice, sternal rub. She does grimace to pain with palpating over surgical dressing   Cardiovascular:     Rate and Rhythm: Normal rate and regular rhythm.     Heart sounds: No murmur.  Pulmonary:     Effort: Pulmonary effort is normal.     Breath sounds: Normal breath sounds.  Abdominal:     Palpations: Abdomen is soft.    Musculoskeletal:     Comments: Intact vac dressing without purulence. Serosanguinous drainage.   Skin:    General: Skin is warm and dry.     Capillary Refill: Capillary refill takes less than 2 seconds.     Lab Results Lab Results  Component Value Date   WBC 21.1 (H) 04/14/2019   HGB 7.1 (L) 04/14/2019   HCT 22.4 (L) 04/14/2019   MCV 89.6 04/14/2019   PLT 181 04/14/2019    Lab Results  Component Value Date   CREATININE 0.81 04/14/2019   BUN 20 04/14/2019   NA 146 (H) 04/14/2019   K 3.7 04/14/2019   CL 115 (H) 04/14/2019   CO2 23 04/14/2019    Lab Results  Component Value Date   ALT 18 04/10/2019   AST 29 04/10/2019   ALKPHOS 81 04/10/2019   BILITOT 0.7 04/10/2019     Microbiology: Recent Results (from the past 240 hour(s))  Surgical pcr screen     Status: None   Collection Time: 04/04/19 11:13 PM   Specimen: Nasal Mucosa; Nasal Swab  Result Value Ref Range Status   MRSA, PCR NEGATIVE NEGATIVE Final   Staphylococcus aureus NEGATIVE NEGATIVE Final    Comment: (NOTE) The Xpert SA Assay (FDA approved for NASAL specimens in patients 31 years of age and older), is one component of a comprehensive surveillance program. It is not intended to diagnose infection nor to guide or monitor treatment. Performed at [facility], [place] [address] [place], [place] [address]   Culture, blood (routine x 2)     Status: None   Collection Time: 04/09/19  8:06 PM   Specimen: BLOOD  Result Value Ref Range Status   Specimen Description BLOOD LEFT ANTECUBITAL  Final   Special Requests   Final    BOTTLES DRAWN AEROBIC AND ANAEROBIC Blood Culture results may not be optimal due to an inadequate volume of blood received in culture bottles   Culture   Final    NO GROWTH 5 DAYS Performed at [facility], [place] [address] [place], [place] [address]    Report Status 04/14/2019 FINAL  Final  Culture, blood (routine x 2)     Status: None   Collection Time: 04/09/19  8:16 PM    Specimen: BLOOD  Result Value Ref Range Status   Specimen Description BLOOD LEFT ANTECUBITAL  Final   Special Requests   Final    BOTTLES DRAWN AEROBIC AND ANAEROBIC Blood Culture results may not be optimal due to an inadequate volume of blood received in culture bottles   Culture   Final  NO GROWTH 5 DAYS Performed at [facility], [address] [place], [place] [address]    Report Status 04/14/2019 FINAL  Final     Rexene Alberts, MSN, NP-C [facility]  [email or web address] Pager: [phone] Office: [phone] [facility] Main Line: [phone]

## 2019-04-14 NOTE — Progress Notes (Signed)
Sleeping. 20 cc in VAC.  Orthopedically stable.

## 2019-04-14 NOTE — Consult Note (Addendum)
Consultation Note Date: 04/14/2019   Patient Name: Robin Snow  DOB: [birth year]  MRN: [id number]  Age / Sex: 79 y.o., female  PCP: [name], MD Referring Physician: Karie Kirks, DO  Reason for Consultation: Establishing goals of care  HPI/Patient Profile:  Per Intake H&P --> Robin Snow a 79 y.o.femalewith medical history significant ofa.fib, HTN, CKD, stroke. Patient had R TKA in [place] in 2019. Patient recently admitted from 10/30-12/23 for dislocation of R knee prosthesis with septic joint. Closed reduction by Dr. 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 Ultimately underwent R AKA by [name]. This complicated by sepsis from E.Coli bacteremia, spent several days in ICU on vent. Also complicated by AKA wound dehissance.Patient finally discharged 12/23 to [facility] on rocephin to complete 6 weeks of therapy with end date of 1/6.  Clinical Assessment and Goals of Care: Reviewed chart. Met with Patient at bedside who was somnolent. Unable to illicit a meaningful response. Spoke to patients bedside RN, Hilaria Ota who said that she had been like this throughout the day.   In reviewing the records it is clear that Robin Snow has endured great physiological stress over the past three months.   Called patients healthcare champion, Robin Snow (daughter) to get a better impression from her as to how her mother was functioning leading up to recent hospitalizations. Robin Snow told me that she started to be more forgetful, could not remember date or where she was over the prior months. These things started to happen gradually then porgressed. Robin Snow use to say her knee would "hurt" prompting Robin Snow to  take her to the ER though there was nothing they could offer. In June-July of this last year is when "the knee starting getting  really bad" as it got very hot and no pressure would be allowed on it without extreme pain. Robin Snow said that her mother vowed "she'd rather have it cut off" than live in this pain. Her recovery from most recent septic joint has been immensely difficult.   Asked patients daughter, Robin Snow about her code status. She said that she had two siblings who had died, one who was ventilated for some time prior to a compassionate extubation. These were difficult scenarios for her family. I asked if Robin Snow and her brothers felt what we were doing for the patient is aligned with what she would want done for herself. Robin Snow said that she does believe that it is. I explained what both Palliative and Hospice care are. I offered to set up a family meeting to discuss patients progress/decline with everyone. Per Robin Snow they would like to continue what they are doing inclusive of her mother being full code and receiving artificial feedings via NGT. I counseled Robin Snow on tube feeding and how this is not a long term solution. Robin Snow said that it is important that she knows her mother is receiving nutrition. Will continue to provide ongoing education on the risks associated with tube feedings.   Told patients  daughter we will continue to check in often. Endorsed that if at anytime they feel that Robin Snow is suffering more than benefiting from treatments that we could transition care to a more comfort oriented route. She is not presently ready for this. I explained my concern and worry that the patient is lacking in improvement cognitively and physically.  Will continue to have discussions in ongoing days.   Discussed [place] meeting with Dr. Benny Lennert  SUMMARY OF RECOMMENDATIONS   Full Code Chaplain consult  Code Status/Advance Care Planning:  Full code  Symptom Management:  Deconditioning: - PT/OT consults  RLE Pain: - Tylenol '650mg'$  NGT TID - Continue oxycodone 5-'10mg'$  PO Q4H - Dilaudid 0.5-'1mg'$  IV Q4H  PRN  Polypharmacy: - Stop unnecessary PRNs, Reglan , methocarbamol, trmadol DC'd  Delirium: - Strict delirium precuations  Palliative Prophylaxis:   Aspiration, Bowel Regimen, Delirium Protocol, Eye Care, Frequent Pain Assessment, Oral Care, Palliative Wound Care and Turn Reposition  Additional Recommendations (Limitations, Scope, Preferences):  Full Scope Treatment  Psycho-social/Spiritual:   Desire for further Chaplaincy support: YES  Additional Recommendations: Caregiving  Support/Resources  Prognosis:   Unclear, will truly depend on patients progress post operatively and with ongoing antibiotics  Concern that if cognitive state does not improve enabling PO intake that she will have a very limited amount of time in terms of weeks to months  Family will likely opt for G-Tube placement if this becomes an eventuality   Discharge Planning: Unclear, if patient is able to improve would likely need SNF placement    Primary Diagnoses: Present on Admission: . History of bacteremia due to Escherichia coli . AF (paroxysmal atrial fibrillation) ([place]) . Wound dehiscence . Essential hypertension  I have reviewed the medical record, interviewed the patient and family, and examined the patient. The following aspects are pertinent.  Past Medical History:  Diagnosis Date  . AKI (acute kidney injury) ([place]) 04/2016  . Anemia   . Arthritis   . Chronic atrial fibrillation ([place])   . Chronic edema   . Chronic venous insufficiency   . CKD (chronic kidney disease), stage III   . Coagulopathy ([place])   . Diabetes mellitus    Type 2  . Diverticulosis   . Dyslipidemia   . GERD (gastroesophageal reflux disease)   . GI bleed 2015   a. lower GI from diverticular source 2015.  [name] Hx of transfusion of packed red blood cells   . Hypertension   . Morbid obesity ([place])   . Obstructive sleep apnea    on CPAP  . Pulmonary hypertension ([place])   . Pyelonephritis 04/2016  . Renal infarct ([place])     a. 04/2016- adm with flank pain, ? pyelo vs renal infarct in setting of recent subtherapeutic INR.  [name] Sinus bradycardia   . Stroke [facility] 2015   Social History   Socioeconomic History  . Marital status: Widowed    Spouse name: Not on file  . Number of children: 4  . Years of education: 64  . Highest education level: Not on file  Occupational History  . Not on file  Tobacco Use  . Smoking status: Former Smoker    Packs/day: 0.12    Years: 34.00    Pack years: 4.08    Types: Cigarettes    Quit date: 07/07/1993    Years since quitting: 25.7  . Smokeless tobacco: Never Used  Substance and Sexual Activity  . Alcohol use: No    Comment: no longer drinks alcohol  .  Drug use: No  . Sexual activity: Never  Other Topics Concern  . Not on file  Social History Narrative   Patient is widowed and has 4 living children, and 2 deceased children.   Patient is right handed.   Patient has hs education.   Patient drinks coffee and soda once a week.   Social Determinants of Health   Financial Resource Strain:   . Difficulty of Paying Living Expenses: Not on file  Food Insecurity:   . Worried About Charity fundraiser in the Last Year: Not on file  . Ran Out of Food in the Last Year: Not on file  Transportation Needs:   . Lack of Transportation (Medical): Not on file  . Lack of Transportation (Non-Medical): Not on file  Physical Activity:   . Days of Exercise per Week: Not on file  . Minutes of Exercise per Session: Not on file  Stress:   . Feeling of Stress : Not on file  Social Connections:   . Frequency of Communication with Friends and Family: Not on file  . Frequency of Social Gatherings with Friends and Family: Not on file  . Attends Religious Services: Not on file  . Active Member of Clubs or Organizations: Not on file  . Attends Archivist Meetings: Not on file  . Marital Status: Not on file   Family History  Problem Relation Age of Onset  . Cancer Father   .  Stroke Maternal Aunt    Scheduled Meds: . sodium chloride   Intravenous Once  . sodium chloride   Intravenous Once  . amiodarone  200 mg Oral Daily  . Chlorhexidine Gluconate Cloth  6 each Topical Daily  . diltiazem  120 mg Oral Daily  . docusate  100 mg Oral BID  . feeding supplement (PRO-STAT SUGAR FREE 64)  30 mL Per Tube TID  . ferrous sulfate  300 mg Oral BID WC  . gabapentin  300 mg Oral Q12H  . metoprolol tartrate  25 mg Oral BID  . multivitamin with minerals  1 tablet Oral Daily  . nutrition supplement (JUVEN)  1 packet Per Tube BID BM  . pantoprazole  40 mg Oral Daily  . pravastatin  20 mg Oral q1800  . senna-docusate  2 tablet Oral BID  . sodium chloride flush  10-40 mL Intracatheter Q12H  . sodium chloride flush  3 mL Intravenous Once   Continuous Infusions: . sodium chloride 10 mL/hr at 04/12/19 2033  .  ceFAZolin (ANCEF) IV Stopped (04/14/19 1453)  . feeding supplement (OSMOLITE 1.5 CAL) 1,000 mL (04/14/19 1630)  . lactated ringers 10 mL/hr at 04/12/19 1250  . methocarbamol (ROBAXIN) IV     PRN Meds:.acetaminophen, bisacodyl, HYDROmorphone (DILAUDID) injection, magnesium citrate, methocarbamol **OR** methocarbamol (ROBAXIN) IV, metoCLOPramide **OR** metoCLOPramide (REGLAN) injection, ondansetron **OR** ondansetron (ZOFRAN) IV, oxyCODONE, oxyCODONE, polyethylene glycol, sodium chloride flush, traMADol Medications Prior to Admission:  Prior to Admission medications   Medication Sig Start Date End Date Taking? Authorizing Provider  amiodarone (PACERONE) 200 MG tablet Take 1 tablet (200 mg total) by mouth daily. 03/30/19  Yes Oretha Mil[place] D, MD  cefTRIAXone (ROCEPHIN) IVPB Inject 2 g into the vein daily. Indication:  E Coli Endocarditis Last Day of Therapy:  04/12/2019 Labs - Once weekly:  CBC/D and BMP, Labs - Every other week:  ESR and CRP 03/29/19 04/27/19 Yes Oretha Milch D, MD  diltiazem (CARDIZEM CD) 120 MG 24 hr capsule Take 1 capsule (120 mg total)  by mouth  daily. 03/29/19  Yes Oretha Milch D, MD  ferrous sulfate 300 (60 Fe) MG/5ML syrup Take 5 mLs (300 mg total) by mouth 2 (two) times daily with a meal. 03/29/19  Yes Oretha Milch D, MD  gabapentin (NEURONTIN) 300 MG capsule Take 1 capsule (300 mg total) by mouth 3 (three) times daily. Need appointment before next refill 03/29/19  Yes Oretha Milch D, MD  lovastatin (MEVACOR) 20 MG tablet Take 1 tablet (20 mg total) by mouth at bedtime. Need appointment for further refills 03/29/19  Yes Oretha Milch D, MD  metoprolol tartrate (LOPRESSOR) 50 MG tablet Take 1 tablet (50 mg total) by mouth 2 (two) times daily. 03/29/19  Yes Oretha Milch D, MD  pantoprazole (PROTONIX) 40 MG tablet Take 1 tablet (40 mg total) by mouth daily. 03/30/19  Yes Oretha Milch D, MD  senna-docusate (SENOKOT-S) 8.6-50 MG tablet Take 2 tablets by mouth 2 (two) times daily. 03/29/19  Yes Oretha Milch D, MD  traMADol (ULTRAM) 50 MG tablet Take 1-2 tablets (50-100 mg total) by mouth every 6 (six) hours as needed for moderate pain. 03/29/19  Yes Oretha Milch D, MD  Vitamin D, Ergocalciferol, (DRISDOL) 1.25 MG (50000 UT) CAPS capsule Take 50,000 Units by mouth every 7 (seven) days.   Yes [provider]  warfarin (COUMADIN) 5 MG tablet HOLD AND DO NOT TAKE UNTIL WOUND EVALUATED BY DR DUDA  TAKE 1 TABLET BY MOUTH DAILY EXCEPT 1 & 1/2 TABLETS ON WEDNESDAY OR AS DIRECTED BY ANTICOAGULATION CLINIC Patient not taking: Reported on 04/01/2019 03/29/19   Desiree Hane, MD   Allergies  Allergen Reactions  . Aspirin Hives and Swelling    Angioedema   . Rofecoxib Hives  . Hydrocodone Hives    Tolerates oxycodone and tramadol   Review of Systems  Physical Exam  Vital Signs: BP 121/74 (BP Location: Right Wrist)   Pulse (!) 118   Temp 97.9 F (36.6 C) (Axillary)   Resp 15   Ht _0  (1.702 m)   Wt 87.5 kg   SpO2 100%   BMI 30.21 kg/m  Pain Scale: 0-10 POSS *See Group Information*: 1-Acceptable,Awake and  alert Pain Score: 0-No pain  SpO2: SpO2: 100 % O2 Device:SpO2: 100 % O2 Flow Rate: .O2 Flow Rate (L/min): 2 L/min  IO: Intake/output summary:   Intake/Output Summary (Last 24 hours) at 04/14/2019 1708 Last data filed at 04/14/2019 1500 Gross per 24 hour  Intake 1559.01 ml  Output 1100 ml  Net 459.01 ml   LBM: Last BM Date: 04/12/19 Baseline Weight: Weight: 102 kg Most recent weight: Weight: 87.5 kg     Palliative Assessment/Data: 10%   Time In: 1600 Time Out: 1715 Time Total: 75 Greater than 50%  of this time was spent counseling and coordinating care related to the above assessment and plan.  Signed by: Rosezella Rumpf, NP   Please contact Palliative Medicine Team phone at [phone] for questions and concerns.  For individual provider: See Shea Evans

## 2019-04-14 NOTE — Progress Notes (Signed)
Pharmacy Antibiotic Note  Robin Snow is a 79 y.o. female admitted on 03/31/2019 with Endocarditis and R hip infection.  Pharmacy has been consulted for cefepime dosing. ID also following. -WBC= 21.1, afeb, SCr= 0.8   Ceftriaxone pta  >> 1/6 Vanco 1/3>> 1/4 Cefepime 1/3>>  12/22 Covid: negative 12/25 COVID: negative 12/25 UCx:  <10k col/ml insignif. Growth/Final 12/25 BCx:  negative 12/29 MRSA PCR: negative 1/3 blood x2- neg   Plan: - Cefepime cefepime 2gm IV q8h -Will follow renal function, cultures and clinical progress    Height: 5\' 7"  (170.2 cm) Weight: 192 lb 14.4 oz (87.5 kg) IBW/kg (Calculated) : 61.6  Temp (24hrs), Avg:97.9 F (36.6 C), Min:96.9 F (36.1 C), Max:98.4 F (36.9 C)  Recent Labs  Lab 04/09/19 2016 04/09/19 2339 04/10/19 0617 04/10/19 0630 04/11/19 0459 04/12/19 0346 04/13/19 0535 04/13/19 1240 04/13/19 1513 04/14/19 0500  WBC  --   --   --  20.1* 20.7* 17.2* 23.2*  --   --  21.1*  CREATININE  --   --  0.77  --  0.70 0.67 0.78  --   --  0.81  LATICACIDVEN 2.8* 1.9  --   --   --   --   --  0.8 0.7  --     Estimated Creatinine Clearance: 64 mL/min (by C-G formula based on SCr of 0.81 mg/dL).    Allergies  Allergen Reactions  . Aspirin Hives and Swelling    Angioedema   . Rofecoxib Hives  . Hydrocodone Hives    Tolerates oxycodone and tramadol   06/12/19, PharmD Clinical Pharmacist **Pharmacist phone directory can now be found on [email or web address] ([email or web address]).  Listed under [facility].

## 2019-04-14 NOTE — Progress Notes (Signed)
Pt very lethargic. Minimal eye fluttering with sternal rub. Unable to take PO medications. VSS. Temp 98.1, BP 118/70, HR 99, O2 sat 98% on room air. Will continue to monitor.  Hazle Nordmann, RN

## 2019-04-14 NOTE — Progress Notes (Signed)
PROGRESS NOTE  JUANTIA HOLFORD QIO:[id number] DOB: [birth year] DOA: 03/31/2019 PCP: [name], MD  Brief History   [name] a 79 y.o.femalewith medical history significant ofa.fib, HTN, CKD, stroke. Patient had R TKA in [place] in 2019. Patient recently admitted from 10/30-12/23 for dislocation of R knee prosthesis with septic joint. Closed reduction by Dr. 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 Ultimately underwent R AKA by Dr. Lajoyce Corners. This complicated by sepsis from E.Coli bacteremia, spent several days in ICU on vent. Also complicated by AKA wound dehissance.Patient finally discharged 12/23 to [facility] health care on rocephin to complete 6 weeks of therapy with end date of 1/6.   [name] full recounting of the prolonged, nearly 2 month hospital stay, please see Dr. Dennison Nancy discharge summary on 12/23.  Per SNF: patient didn't get rocephinon(12/24). At around 7pm they noticed asmall amount ofbloodydrainage from dressingand noticedfoulodor from wound. At 745pm noticed dressing was saturated and at 9pm now oozing blood around dressing. Patient was noted to be running fever of 100.7 at facility. Facility confirms that coumadin is still on hold as per DC summary plan.  No fever in ED, lab work actually looks fairly stable compared to 2 days ago.  CT of RLE femur is worrisome for hematoma with probable superimposed infection extending from amputation site.She was admitted under hospital service with Ortho to consult. She was given Rocephin and vancomycin in ED. She is scheduled to have right hip disarticulation with Dr. Lajoyce Corners.  She underwent hip disarticulation in the OR on 04/05/19 and postoperatively she had some hemorrhagic shock and became hypotensive received 3 units of albumin Neo-Synephrine as well as 3 units of PRBCs and was transferred to the intensive care unit for further monitoring.  She  is now hemodynamically stable and improved and has been transferred to PCU.  She is much more awake and alert and does have some anemia which we will continue to monitor and is stabilized.  PT OT recommending skilled nursing facility.  I spoke with orthopedics Dr. Lajoyce Corners who recommends resuming her home anticoagulation and she will be started on a heparin drip with a Coumadin bridge given her elevated CHA2DS2-VASc score of 7.  Need to closely monitor her anticoagulation as her platelet count dropped a little bit but her hemoglobin has remained stable.  Orthopedic surgery has transfused the patient with 2 units PRBC's in preparation to take her back to the OR on 04/11/2018 for repeat debridement. She underwent this debridement today with placement of the wound vac.   Consultants  . Orthopedic surgery . Infectious disease . PCCM  Procedures  . Disarticulation of right hip with dehiscence and infection of stump . Wound VAC placement  Antibiotics   Anti-infectives (From admission, onward)   Start     Dose/Rate Route Frequency Ordered Stop   04/14/19 1400  ceFAZolin (ANCEF) IVPB 2g/100 mL premix     2 g 200 mL/hr over 30 Minutes Intravenous Every 8 hours 04/14/19 1024     04/13/19 0600  ceFAZolin (ANCEF) IVPB 2g/100 mL premix     2 g 200 mL/hr over 30 Minutes Intravenous On call to O.R. 04/12/19 1214 04/12/19 1629   04/10/19 2000  vancomycin (VANCOREADY) IVPB 1750 mg/350 mL  Status:  Discontinued     1,750 mg 175 mL/hr over 120 Minutes Intravenous Every 24 hours 04/10/19 0847 04/10/19 0850   04/10/19 2000  vancomycin (VANCOREADY) IVPB 1250 mg/250 mL  Status:  Discontinued  1,250 mg 166.7 mL/hr over 90 Minutes Intravenous Every 24 hours 04/10/19 0850 04/10/19 0943   04/09/19 2000  vancomycin (VANCOREADY) IVPB 1500 mg/300 mL  Status:  Discontinued     1,500 mg 150 mL/hr over 120 Minutes Intravenous Every 24 hours 04/09/19 1916 04/10/19 0847   04/09/19 2000  ceFEPIme (MAXIPIME) 2 g in  sodium chloride 0.9 % 100 mL IVPB  Status:  Discontinued     2 g 200 mL/hr over 30 Minutes Intravenous Every 8 hours 04/09/19 1916 04/14/19 1024   04/05/19 1400  ceFAZolin (ANCEF) IVPB 2g/100 mL premix     2 g 200 mL/hr over 30 Minutes Intravenous On call to O.R. 04/05/19 0723 04/05/19 1100   04/01/19 0500  cefTRIAXone (ROCEPHIN) IVPB  Status:  Discontinued    Note to Pharmacy: Indication:  E Coli Endocarditis Last Day of Therapy:  04/12/2019 Labs - Once weekly:  CBC/D and BMP, Labs - Every other week:  ESR and CRP     2 g Intravenous Every 24 hours 03/31/19 0436 03/31/19 0500   04/01/19 0400  cefTRIAXone (ROCEPHIN) 2 g in sodium chloride 0.9 % 100 mL IVPB  Status:  Discontinued     2 g 200 mL/hr over 30 Minutes Intravenous Every 24 hours 03/31/19 0501 04/09/19 1916   03/31/19 0400  vancomycin (VANCOCIN) IVPB 1000 mg/200 mL premix     1,000 mg 200 mL/hr over 60 Minutes Intravenous  Once 03/31/19 0356 03/31/19 0606   03/31/19 0100  cefTRIAXone (ROCEPHIN) 2 g in sodium chloride 0.9 % 100 mL IVPB     2 g 200 mL/hr over 30 Minutes Intravenous  Once 03/31/19 0100 03/31/19 0329     Subjective  The patient is sleeping soundly. No new complaints.  Objective   Vitals:  Vitals:   04/14/19 0802 04/14/19 1151  BP: 118/70 118/70  Pulse: 99 96  Resp: 14 14  Temp: 98.1 F (36.7 C) 98 F (36.7 C)  SpO2: 98% 100%   Exam:  Constitutional:  . The patient is sleeping deeply. No acute distress. Respiratory:  . No increased work of breathing. . No wheezes, rales, or rhonchi . No tactile fremitus Cardiovascular:  . Regular rate and rhythm . No murmurs, ectopy, or gallups. . No lateral PMI. No thrills. Abdomen:  . Abdomen is soft, non-tender, non-distended . No hernias, masses, or organomegaly . Normoactive bowel sounds.  Musculoskeletal:  . No cyanosis, clubbing, or edema . Foul smelling right hip wound Skin:  . No rashes, lesions, ulcers . palpation of skin: no induration or  nodules . Wound VAC in place. Neurologic: Unable to evaluate due to the patient's inability to cooperate with exam. Psychiatric:  . Unable to evaluate due to the patient's inability to cooperate with exam.  I have personally reviewed the following:   Today's Data  . Vitals, CMP, CBC  Scheduled Meds: . sodium chloride   Intravenous Once  . sodium chloride   Intravenous Once  . amiodarone  200 mg Oral Daily  . Chlorhexidine Gluconate Cloth  6 each Topical Daily  . diltiazem  120 mg Oral Daily  . docusate  100 mg Oral BID  . feeding supplement (PRO-STAT SUGAR FREE 64)  30 mL Per Tube TID  . ferrous sulfate  300 mg Oral BID WC  . gabapentin  300 mg Oral Q12H  . metoprolol tartrate  25 mg Oral BID  . multivitamin with minerals  1 tablet Oral Daily  . nutrition supplement (JUVEN)  1 packet  Per Tube BID BM  . pantoprazole  40 mg Oral Daily  . pravastatin  20 mg Oral q1800  . senna-docusate  2 tablet Oral BID  . sodium chloride flush  10-40 mL Intracatheter Q12H  . sodium chloride flush  3 mL Intravenous Once   Continuous Infusions: . sodium chloride 10 mL/hr at 04/12/19 2033  .  ceFAZolin (ANCEF) IV    . feeding supplement (OSMOLITE 1.5 CAL)    . lactated ringers 10 mL/hr at 04/12/19 1250  . methocarbamol (ROBAXIN) IV      Principal Problem:   Wound dehiscence Active Problems:   Essential hypertension   AF (paroxysmal atrial fibrillation) (HCC)   History of bacteremia due to Escherichia coli   S/P AKA (above knee amputation), right (HCC)   Cellulitis of right lower extremity   Dehiscence of amputation stump (HCC)   Fever   LOS: 14 days   A & P  Wound dehiscence/infection of amputation site/right stump status post hip disarticulation on 04/06/2019 postoperative day 5: The patient has a foul smell and swelling at stump site. Last evening she became febrile, tachypneic and tachycardic. She appeared septic. She was given IV fluids for this. Narcotic pain control was held due  to patient lethargy. Infectious disease was consulted. Plans are for surgery to take him back for repeat I&D on 04/11/2018. ID has been consulted. The patient remains on IV cefepime at this time. The patient's hemoglobin again dropped abruptly this morning to 5.5 and is receiving another 2 units of PRBC's to make a total of 5 units PRBC's transfused so far. Wound VAC in in place with thick bloody effluent from wound indicating continue muscle breakdown. Per orthopedic surgery the patient had extensive necrotic muscles including muscles originating in the pelvis and tissue were sent for cultures and patient will need long-term antibiotics. The patient required transfer to ICU for post hemorrhagic shock after the surgery. She is now in the progressive care unit. She is receiving  IV Dilaudid 0.5 mg every 4 as needed for severe pain, p.o. oxycodone IR and oxycodone acetaminophen 1 tab p.o. twice daily as needed for pain control. Bowel regimen in place. Surgical pathology results showed a gangrenous necrosis of skin and soft tissue with atheroma formation with a luminal stenosis and dystrophic calcifications of vasculature and it stated that the gangrenous necrosis is extensive soft tissue resection margin. She has been converted from Rocephin to Cefepime IV. Infectious disease has been consulted as WBC continues to increase. Patient's VTE prophylaxis with coumadin is being bridged with heparin, but this has been held due to recent drop in hemoglobin. CT abdomen and pelvis has been performed on 04/08/2018. It has demonstrated mottled air and heterogeneous fluid in the operative bed extending from the hip joint space to the skin surface. Small peripherally enhancing fluid collections just deep to the incision inferiorly, largest collection measures 3.4 cm greatest dimension. While findings may be related to recent surgery, sterility is indeterminate by imaging, and mottled air may represent infection. Heterogeneous  enlargement and mottled air involving the right obturator, gluteal and iliopsoas muscles, may be postoperative or infectious. There is no evidence of active bleeding. Also body wall edema has slightly increased from prior exam. Blood cultures were repeated. The patient has returned to the OR with orthopedic surgery today for repeat debridement. She is being transfused with one unit of PRBC's. Wound VAC is placed.  Sepsis: Pt with decreased level of consciousness this morning with hypothermia, bradycardia, and hypotension. Leukocytosis  of 23.2.[name] Improved consciousness with iV fluid bolus. Monitor. Infectious disease observed the patient with small tremors today. They are concerned that cefepime is the cause for this. They have changed her to antibiotic to Ancef. I appreciate their help.  Acute Blood Loss Anemia from Surgical Intervention: Overnight hemoglobin dropped from 7.7 to 5.5 this morning. She was given 2 units PRBC's this morning in addition to the 3 units she had received earlier in this stay. Patient has thick bloody effluent from Wounds Vac. This is the presumed source of blood loss. Iron deficiency is being supplemented, and hemoglobin will continue to be monitored as necessary. Heparin has been held. It was being used to bridge until the patient was therapeutic on coumadin. CHADSVASC2 score is 7. She received 2 units of PRBC's in preparation for return to OR. She is being transfused with one unit of PRBC's post-operatively as well. Post op hemoglobin was 10.2. Today this is decreased to 8.3. Monitor and transfuse as necessary.  E. coli Bacteremia/Endocarditis: She has been converted to IV cefepime. I appreciate  Infectious disease's assistance.   Permanent Atrial Fibrillation : Heart rates have been relatively low today ranging from 59 to 60. I will reduce dose of metoprolol. Heparin has been held due to abrupt drop in hemoglobin. Monitor heart rates and electrolytes. CHA2DS2-VASc of  7.  Essential Hypertension: Blood pressures normotensive today after blood transfusion and reduction of metoprolol dose. Monitor.   Obesity with severe malnutrition in the context of acute illness/injury: Complicates all cares. Dietary has been consulted. They have recommended  Enlive p.o. 3 times daily along with Magic cup 3 times daily with meals and multivitamin with minerals daily as well as liberalizing diet to help improve p.o. intake. Will place cortrak ans request dietician to make recommendation for tube feeds to optimally provide for healing.  Hyperlipidemia: Continue Pravastatin 20 g p.o. nightly.  GERD: Continue Pantoprazole 40 mg p.o. daily.  Hypomagnesemia: 1.7 today. Monitor and replace as necessary.  Hyperbilirubinemia: Resolved and likely reactive. Monitor intermittently.   Leukocytosis, worsening: Due to complicated wound situation. Reactive in the setting of Surgical intervention and Hip Disarticulation. Pt is receiving IV cefepime. Will repeat echocardiogram as per infectious disease's recommendations. Repeat I & D on 04/11/2018.   Thrombocytopenia: Patient's platelet count dropped from 159 > 124 > 179 >165>178>157. Monitor.   Somnolence and Lethargy: Encephalopathy due to sepsis related to ongoing infectious issues related to right hip disarticulation/wound infectious. CT head was checked and did not demonstrate any acute pathology. The patient was awake, alert, and communicative later in the day. Today (04/14/2019) the patient is again sleeping soundly in the morning. Possibly this is just part of her pattern. Will observe for signs of more serious causes of encephalopathy.  I have seen and examined this patient myself. I have spent 32 minutes in her evaluation and care.  DVT prophylaxis: SCDs. Heparin had been used to bridge the patient while he was becoming therapeutic on coumadin. However, it has been stopped given blood loss anemia.  Code Status: FULL CODE   Family Communication: No family present at bedside  Disposition Plan: SNF When bed available  Clayburn Weekly, DO [facility] Direct contact: see [email or web address]  7PM-7AM contact night coverage as above 04/14/2019, 1:31 PM  LOS: 10 days

## 2019-04-15 LAB — GLUCOSE, CAPILLARY
Glucose-Capillary: 109 mg/dL — ABNORMAL HIGH (ref 70–99)
Glucose-Capillary: 110 mg/dL — ABNORMAL HIGH (ref 70–99)
Glucose-Capillary: 117 mg/dL — ABNORMAL HIGH (ref 70–99)
Glucose-Capillary: 136 mg/dL — ABNORMAL HIGH (ref 70–99)
Glucose-Capillary: 148 mg/dL — ABNORMAL HIGH (ref 70–99)
Glucose-Capillary: 154 mg/dL — ABNORMAL HIGH (ref 70–99)
Glucose-Capillary: 195 mg/dL — ABNORMAL HIGH (ref 70–99)
Glucose-Capillary: 78 mg/dL (ref 70–99)
Glucose-Capillary: 80 mg/dL (ref 70–99)

## 2019-04-15 LAB — PROTIME-INR
INR: 1.7 — ABNORMAL HIGH (ref 0.8–1.2)
Prothrombin Time: 19.9 seconds — ABNORMAL HIGH (ref 11.4–15.2)

## 2019-04-15 LAB — CBC WITH DIFFERENTIAL/PLATELET
Abs Immature Granulocytes: 0.65 10*3/uL — ABNORMAL HIGH (ref 0.00–0.07)
Basophils Absolute: 0 10*3/uL (ref 0.0–0.1)
Basophils Relative: 0 %
Eosinophils Absolute: 0 10*3/uL (ref 0.0–0.5)
Eosinophils Relative: 0 %
HCT: 22.7 % — ABNORMAL LOW (ref 36.0–46.0)
Hemoglobin: 7.1 g/dL — ABNORMAL LOW (ref 12.0–15.0)
Immature Granulocytes: 4 %
Lymphocytes Relative: 10 %
Lymphs Abs: 1.8 10*3/uL (ref 0.7–4.0)
MCH: 28.7 pg (ref 26.0–34.0)
MCHC: 31.3 g/dL (ref 30.0–36.0)
MCV: 91.9 fL (ref 80.0–100.0)
Monocytes Absolute: 1.2 10*3/uL — ABNORMAL HIGH (ref 0.1–1.0)
Monocytes Relative: 7 %
Neutro Abs: 14.1 10*3/uL — ABNORMAL HIGH (ref 1.7–7.7)
Neutrophils Relative %: 79 %
Platelets: 192 10*3/uL (ref 150–400)
RBC: 2.47 MIL/uL — ABNORMAL LOW (ref 3.87–5.11)
RDW: 18 % — ABNORMAL HIGH (ref 11.5–15.5)
WBC: 17.7 10*3/uL — ABNORMAL HIGH (ref 4.0–10.5)
nRBC: 0 % (ref 0.0–0.2)

## 2019-04-15 LAB — COMPREHENSIVE METABOLIC PANEL
ALT: 9 U/L (ref 0–44)
AST: 17 U/L (ref 15–41)
Albumin: 1.4 g/dL — ABNORMAL LOW (ref 3.5–5.0)
Alkaline Phosphatase: 69 U/L (ref 38–126)
Anion gap: 8 (ref 5–15)
BUN: 21 mg/dL (ref 8–23)
CO2: 23 mmol/L (ref 22–32)
Calcium: 7.7 mg/dL — ABNORMAL LOW (ref 8.9–10.3)
Chloride: 115 mmol/L — ABNORMAL HIGH (ref 98–111)
Creatinine, Ser: 0.96 mg/dL (ref 0.44–1.00)
GFR calc Af Amer: >60 mL/min (ref >60)
GFR calc non Af Amer: 56 mL/min — ABNORMAL LOW (ref >60)
Glucose, Bld: 95 mg/dL (ref 70–99)
Potassium: 3.2 mmol/L — ABNORMAL LOW (ref 3.5–5.1)
Sodium: 146 mmol/L — ABNORMAL HIGH (ref 135–145)
Total Bilirubin: 0.4 mg/dL (ref 0.3–1.2)
Total Protein: 4.1 g/dL — ABNORMAL LOW (ref 6.5–8.1)

## 2019-04-15 MED ORDER — POTASSIUM CHLORIDE CRYS ER 20 MEQ PO TBCR
40.0000 meq | EXTENDED_RELEASE_TABLET | Freq: Once | ORAL | Status: AC
  Administered 2019-04-15: 10:00:00 40 meq via ORAL
  Filled 2019-04-15: qty 2

## 2019-04-15 NOTE — Progress Notes (Signed)
Before the cortrack was placed, pt had not received her AFib or heart rate medicines for at least two days.  At the beginning of shift, pt's heart rate would go as high as 130s and 140s.  Informed the on call hospitalist.  They ordered to place the pt on an IV cardizem drip, give a 10 mg loading dose from the cardizem, and stopped the oral cardizem.  During the night, pt's heart rate has gone down to 80s -110s while cardizem has gone at a rate of 5 mL/hr.  Will continue to monitor.  Harriet Masson, RN

## 2019-04-15 NOTE — Progress Notes (Signed)
Pt had a total of 440cc output in wound vac canister as per report from night shift RN.  From 7a-3p today has had a total of 350cc serosanguineous output.  Paged attending MD to notify, awaiting call back.  Oncoming RN made aware during report.

## 2019-04-15 NOTE — Progress Notes (Signed)
PROGRESS NOTE  KEDRA TAMAS ZOX:[id number] DOB: 01/25/1941 DOA: 03/31/2019 PCP: [name], MD  Brief History   [name] a 79 y.o.femalewith medical history significant ofa.fib, HTN, CKD, stroke. Patient had R TKA in [place] in 2019. Patient recently admitted from 10/30-12/23 for dislocation of R knee prosthesis with septic joint. Closed reduction by Dr. 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 Ultimately underwent R AKA by Dr. Lajoyce Corners. This complicated by sepsis from E.Coli bacteremia, spent several days in ICU on vent. Also complicated by AKA wound dehissance.Patient finally discharged 12/23 to [facility] health care on rocephin to complete 6 weeks of therapy with end date of 1/6.   [name] full recounting of the prolonged, nearly 2 month hospital stay, please see Dr. Dennison Nancy discharge summary on 12/23.  Per SNF: patient didn't get rocephinon(12/24). At around 7pm they noticed asmall amount ofbloodydrainage from dressingand noticedfoulodor from wound. At 745pm noticed dressing was saturated and at 9pm now oozing blood around dressing. Patient was noted to be running fever of 100.7 at facility. Facility confirms that coumadin is still on hold as per DC summary plan.  No fever in ED, lab work actually looks fairly stable compared to 2 days ago.  CT of RLE femur is worrisome for hematoma with probable superimposed infection extending from amputation site.She was admitted under hospital service with Ortho to consult. She was given Rocephin and vancomycin in ED. She is scheduled to have right hip disarticulation with Dr. Lajoyce Corners.  She underwent hip disarticulation in the OR on 04/05/19 and postoperatively she had some hemorrhagic shock and became hypotensive received 3 units of albumin Neo-Synephrine as well as 3 units of PRBCs and was transferred to the intensive care unit for further monitoring.  She  is now hemodynamically stable and improved and has been transferred to PCU.  She is much more awake and alert and does have some anemia which we will continue to monitor and is stabilized.  PT OT recommending skilled nursing facility.  I spoke with orthopedics Dr. Lajoyce Corners who recommends resuming her home anticoagulation and she will be started on a heparin drip with a Coumadin bridge given her elevated CHA2DS2-VASc score of 7.  Need to closely monitor her anticoagulation as her platelet count dropped a little bit but her hemoglobin has remained stable.  Orthopedic surgery has transfused the patient with 2 units PRBC's in preparation to take her back to the OR on 04/11/2018 for repeat debridement. She underwent this debridement today with placement of the wound vac.   Due to the patient extremely complicated course, and her baseline multiple comorbidities which present as challenges to her survival and recovery from this illness, palliative care has been consulted.  Consultants  . Orthopedic surgery . Infectious disease . PCCM  Procedures  . Disarticulation of right hip with dehiscence and infection of stump . Wound VAC placement  Antibiotics   Anti-infectives (From admission, onward)   Start     Dose/Rate Route Frequency Ordered Stop   04/14/19 1400  ceFAZolin (ANCEF) IVPB 2g/100 mL premix     2 g 200 mL/hr over 30 Minutes Intravenous Every 8 hours 04/14/19 1024     04/13/19 0600  ceFAZolin (ANCEF) IVPB 2g/100 mL premix     2 g 200 mL/hr over 30 Minutes Intravenous On call to O.R. 04/12/19 1214 04/12/19 1629   04/10/19 2000  vancomycin (VANCOREADY) IVPB 1750 mg/350 mL  Status:  Discontinued     1,750 mg  175 mL/hr over 120 Minutes Intravenous Every 24 hours 04/10/19 0847 04/10/19 0850   04/10/19 2000  vancomycin (VANCOREADY) IVPB 1250 mg/250 mL  Status:  Discontinued     1,250 mg 166.7 mL/hr over 90 Minutes Intravenous Every 24 hours 04/10/19 0850 04/10/19 0943   04/09/19 2000   vancomycin (VANCOREADY) IVPB 1500 mg/300 mL  Status:  Discontinued     1,500 mg 150 mL/hr over 120 Minutes Intravenous Every 24 hours 04/09/19 1916 04/10/19 0847   04/09/19 2000  ceFEPIme (MAXIPIME) 2 g in sodium chloride 0.9 % 100 mL IVPB  Status:  Discontinued     2 g 200 mL/hr over 30 Minutes Intravenous Every 8 hours 04/09/19 1916 04/14/19 1024   04/05/19 1400  ceFAZolin (ANCEF) IVPB 2g/100 mL premix     2 g 200 mL/hr over 30 Minutes Intravenous On call to O.R. 04/05/19 0723 04/05/19 1100   04/01/19 0500  cefTRIAXone (ROCEPHIN) IVPB  Status:  Discontinued    Note to Pharmacy: Indication:  E Coli Endocarditis Last Day of Therapy:  04/12/2019 Labs - Once weekly:  CBC/D and BMP, Labs - Every other week:  ESR and CRP     2 g Intravenous Every 24 hours 03/31/19 0436 03/31/19 0500   04/01/19 0400  cefTRIAXone (ROCEPHIN) 2 g in sodium chloride 0.9 % 100 mL IVPB  Status:  Discontinued     2 g 200 mL/hr over 30 Minutes Intravenous Every 24 hours 03/31/19 0501 04/09/19 1916   03/31/19 0400  vancomycin (VANCOCIN) IVPB 1000 mg/200 mL premix     1,000 mg 200 mL/hr over 60 Minutes Intravenous  Once 03/31/19 0356 03/31/19 0606   03/31/19 0100  cefTRIAXone (ROCEPHIN) 2 g in sodium chloride 0.9 % 100 mL IVPB     2 g 200 mL/hr over 30 Minutes Intravenous  Once 03/31/19 0100 03/31/19 0329     Subjective  The patient is sleeping soundly. No new complaints. Occasional jerks.  Objective   Vitals:  Vitals:   04/15/19 1015 04/15/19 1200  BP:  (!) 119/57  Pulse: (!) 58 60  Resp: 16 15  Temp:  98.7 F (37.1 C)  SpO2: 100%    Exam:  Constitutional:  . The patient is sleeping deeply. No acute distress. Respiratory:  . No increased work of breathing. . No wheezes, rales, or rhonchi . No tactile fremitus Cardiovascular:  . Regular rate and rhythm . No murmurs, ectopy, or gallups. . No lateral PMI. No thrills. Abdomen:  . Abdomen is soft, non-tender, non-distended . No hernias, masses, or  organomegaly . Normoactive bowel sounds.  Musculoskeletal:  . No cyanosis, clubbing, or edema . Foul smelling right hip wound Skin:  . No rashes, lesions, ulcers . palpation of skin: no induration or nodules . Wound VAC in place. Neurologic: Unable to evaluate due to the patient's inability to cooperate with exam. Psychiatric:  . Unable to evaluate due to the patient's inability to cooperate with exam.  I have personally reviewed the following:   Today's Data  . Vitals, CMP, CBC  Scheduled Meds: . sodium chloride   Intravenous Once  . acetaminophen (TYLENOL) oral liquid 160 mg/5 mL  650 mg Oral TID  . amiodarone  200 mg Oral Daily  . Chlorhexidine Gluconate Cloth  6 each Topical Daily  . diltiazem  120 mg Oral Daily  . docusate  100 mg Oral BID  . feeding supplement (PRO-STAT SUGAR FREE 64)  30 mL Per Tube TID  . ferrous sulfate  300  mg Oral BID WC  . gabapentin  300 mg Oral Q12H  . metoprolol tartrate  25 mg Oral BID  . multivitamin with minerals  1 tablet Oral Daily  . nutrition supplement (JUVEN)  1 packet Per Tube BID BM  . pantoprazole  40 mg Oral Daily  . pravastatin  20 mg Oral q1800  . senna-docusate  2 tablet Oral BID   Continuous Infusions: . sodium chloride 10 mL/hr at 04/15/19 0803  .  ceFAZolin (ANCEF) IV 2 g (04/15/19 1307)  . diltiazem (CARDIZEM) infusion 10 mg/hr (04/15/19 0803)  . feeding supplement (OSMOLITE 1.5 CAL) 40 mL/hr at 04/15/19 0803  . lactated ringers 10 mL/hr at 04/12/19 1250    Principal Problem:   Wound dehiscence Active Problems:   Right leg pain   Essential hypertension   Goals of care, counseling/discussion   AF (paroxysmal atrial fibrillation) (HCC)   History of bacteremia due to Escherichia coli   S/P AKA (above knee amputation), right (HCC)   Cellulitis of right lower extremity   Dehiscence of amputation stump (HCC)   Fever   Palliative care by specialist   Weakness   LOS: 15 days   A & P  Wound dehiscence/infection of  amputation site/right stump status post hip disarticulation on 04/06/2019 postoperative day 5: The patient has a foul smell and swelling at stump site. Last evening she became febrile, tachypneic and tachycardic. She appeared septic. She was given IV fluids for this. Narcotic pain control was held due to patient lethargy. Infectious disease was consulted. Plans are for surgery to take him back for repeat I&D on 04/11/2018. ID has been consulted. The patient remains on IV cefepime at this time. The patient's hemoglobin again dropped abruptly this morning to 5.5 and is receiving another 2 units of PRBC's to make a total of 5 units PRBC's transfused so far. Wound VAC in in place with thick bloody effluent from wound indicating continue muscle breakdown. Per orthopedic surgery the patient had extensive necrotic muscles including muscles originating in the pelvis and tissue were sent for cultures and patient will need long-term antibiotics. The patient required transfer to ICU for post hemorrhagic shock after the surgery. She is now in the progressive care unit. She is receiving  IV Dilaudid 0.5 mg every 4 as needed for severe pain, p.o. oxycodone IR and oxycodone acetaminophen 1 tab p.o. twice daily as needed for pain control. Bowel regimen in place. Surgical pathology results showed a gangrenous necrosis of skin and soft tissue with atheroma formation with a luminal stenosis and dystrophic calcifications of vasculature and it stated that the gangrenous necrosis is extensive soft tissue resection margin. She has been converted from Rocephin to Cefepime IV. Infectious disease has been consulted as WBC continues to increase. Patient's VTE prophylaxis with coumadin is being bridged with heparin, but this has been held due to recent drop in hemoglobin. CT abdomen and pelvis has been performed on 04/08/2018. It has demonstrated mottled air and heterogeneous fluid in the operative bed extending from the hip joint space to the  skin surface. Small peripherally enhancing fluid collections just deep to the incision inferiorly, largest collection measures 3.4 cm greatest dimension. While findings may be related to recent surgery, sterility is indeterminate by imaging, and mottled air may represent infection. Heterogeneous enlargement and mottled air involving the right obturator, gluteal and iliopsoas muscles, may be postoperative or infectious. There is no evidence of active bleeding. Also body wall edema has slightly increased from prior exam. Blood  cultures were repeated. The patient has returned to the OR with orthopedic surgery today for repeat debridement. She is being transfused with one unit of PRBC's. Wound VAC is placed.  Sepsis: Pt with decreased level of consciousness this morning with hypothermia, bradycardia, and hypotension. Leukocytosis of 23.2.[name] Improved consciousness with iV fluid bolus. Monitor. Infectious disease observed the patient with small tremors today. They are concerned that cefepime is the cause for this. They have changed her to antibiotic to Ancef. I appreciate their help.  Acute Blood Loss Anemia from Surgical Intervention: Overnight hemoglobin dropped from 7.7 to 5.5 this morning. She was given 2 units PRBC's this morning in addition to the 3 units she had received earlier in this stay. Patient has thick bloody effluent from Wounds Vac. This is the presumed source of blood loss. Iron deficiency is being supplemented, and hemoglobin will continue to be monitored as necessary. Heparin has been held. It was being used to bridge until the patient was therapeutic on coumadin. CHADSVASC2 score is 7. She received 2 units of PRBC's in preparation for return to OR. She is being transfused with one unit of PRBC's post-operatively as well. Post op hemoglobin was 10.2. Today this is decreased to 8.3. Monitor and transfuse as necessary.  E. coli Bacteremia/Endocarditis: She has been converted to IV cefepime. I  appreciate  Infectious disease's assistance.   Permanent Atrial Fibrillation : Heart rates have been relatively low today ranging from 59 to 60. I will reduce dose of metoprolol. Heparin has been held due to abrupt drop in hemoglobin. Monitor heart rates and electrolytes. CHA2DS2-VASc of 7.  Essential Hypertension: Blood pressures normotensive today after blood transfusion and reduction of metoprolol dose. Monitor.   Obesity with severe malnutrition in the context of acute illness/injury: Complicates all cares. Dietary has been consulted. They have recommended  Enlive p.o. 3 times daily along with Magic cup 3 times daily with meals and multivitamin with minerals daily as well as liberalizing diet to help improve p.o. intake. Will place cortrak ans request dietician to make recommendation for tube feeds to optimally provide for healing.  Hyperlipidemia: Continue Pravastatin 20 g p.o. nightly.  GERD: Continue Pantoprazole 40 mg p.o. daily.  Hypomagnesemia: 1.7 today. Monitor and replace as necessary.  Hyperbilirubinemia: Resolved and likely reactive. Monitor intermittently.   Leukocytosis, worsening: Due to complicated wound situation. Reactive in the setting of Surgical intervention and Hip Disarticulation. Pt is receiving IV cefepime. Will repeat echocardiogram as per infectious disease's recommendations. Repeat I & D on 04/11/2018.   Thrombocytopenia: Patient's platelet count dropped from 159 > 124 > 179 >165>178>157. Monitor.   Somnolence and Lethargy: Encephalopathy due to sepsis related to ongoing infectious issues related to right hip disarticulation/wound infectious. CT head was checked and did not demonstrate any acute pathology. The patient was awake, alert, and communicative later in the day. Today (04/14/2019) the patient is again sleeping soundly in the morning. Possibly this is just part of her pattern. Will observe for signs of more serious causes of encephalopathy.  I have  seen and examined this patient myself. I have spent 34 minutes in her evaluation and care.  DVT prophylaxis: SCDs. Heparin had been used to bridge the patient while he was becoming therapeutic on coumadin. However, it has been stopped given blood loss anemia.  Code Status: FULL CODE  Family Communication: No family present at bedside  Disposition Plan: SNF When bed available  [name], DO [facility] Direct contact: see [email or web address]  7PM-7AM contact night coverage  as above 04/15/2019, 1:51 PM  LOS: 10 days

## 2019-04-15 NOTE — Progress Notes (Signed)
   04/15/19 0957  Clinical Encounter Type  Visited With Patient;Patient not available  Visit Type Initial  Referral From Palliative care team   Chaplain attempted to visit with patient, but patient appeared to not be alert. No family present at the moment. Spiritual care services available as needed.   Alda Ponder, Chaplain

## 2019-04-16 LAB — BASIC METABOLIC PANEL
Anion gap: 3 — ABNORMAL LOW (ref 5–15)
Anion gap: 7 (ref 5–15)
BUN: 31 mg/dL — ABNORMAL HIGH (ref 8–23)
BUN: 37 mg/dL — ABNORMAL HIGH (ref 8–23)
CO2: 23 mmol/L (ref 22–32)
CO2: 21 mmol/L — ABNORMAL LOW (ref 22–32)
Calcium: 7.7 mg/dL — ABNORMAL LOW (ref 8.9–10.3)
Calcium: 7.6 mg/dL — ABNORMAL LOW (ref 8.9–10.3)
Chloride: 120 mmol/L — ABNORMAL HIGH (ref 98–111)
Chloride: 117 mmol/L — ABNORMAL HIGH (ref 98–111)
Creatinine, Ser: 1.06 mg/dL — ABNORMAL HIGH (ref 0.44–1.00)
Creatinine, Ser: 1.14 mg/dL — ABNORMAL HIGH (ref 0.44–1.00)
GFR calc Af Amer: 58 mL/min — ABNORMAL LOW (ref >60)
GFR calc Af Amer: 53 mL/min — ABNORMAL LOW (ref >60)
GFR calc non Af Amer: 50 mL/min — ABNORMAL LOW (ref >60)
GFR calc non Af Amer: 46 mL/min — ABNORMAL LOW (ref >60)
Glucose, Bld: 86 mg/dL (ref 70–99)
Glucose, Bld: 140 mg/dL — ABNORMAL HIGH (ref 70–99)
Potassium: 3 mmol/L — ABNORMAL LOW (ref 3.5–5.1)
Potassium: 3.4 mmol/L — ABNORMAL LOW (ref 3.5–5.1)
Sodium: 146 mmol/L — ABNORMAL HIGH (ref 135–145)
Sodium: 145 mmol/L (ref 135–145)

## 2019-04-16 LAB — GLUCOSE, CAPILLARY
Glucose-Capillary: 90 mg/dL (ref 70–99)
Glucose-Capillary: 113 mg/dL — ABNORMAL HIGH (ref 70–99)
Glucose-Capillary: 120 mg/dL — ABNORMAL HIGH (ref 70–99)
Glucose-Capillary: 144 mg/dL — ABNORMAL HIGH (ref 70–99)
Glucose-Capillary: 117 mg/dL — ABNORMAL HIGH (ref 70–99)

## 2019-04-16 LAB — PROTIME-INR
INR: 1.6 — ABNORMAL HIGH (ref 0.8–1.2)
Prothrombin Time: 18.7 seconds — ABNORMAL HIGH (ref 11.4–15.2)

## 2019-04-16 MED ORDER — DILTIAZEM HCL ER COATED BEADS 180 MG PO CP24
180.0000 mg | ORAL_CAPSULE | Freq: Every day | ORAL | Status: DC
  Administered 2019-04-16 – 2019-04-17 (×2): 180 mg via ORAL
  Filled 2019-04-16 (×2): qty 1

## 2019-04-16 MED ORDER — POTASSIUM CHLORIDE 10 MEQ/100ML IV SOLN
10.0000 meq | INTRAVENOUS | Status: AC
  Administered 2019-04-16 (×4): 10 meq via INTRAVENOUS
  Filled 2019-04-16 (×4): qty 100

## 2019-04-16 NOTE — Progress Notes (Addendum)
PROGRESS NOTE  Robin Snow NWG:[id number] DOB: [birth year] DOA: 03/31/2019 PCP: [name], MD  Brief History   Robin Snow a 79 y.o.femalewith medical history significant ofa.fib, HTN, CKD, stroke. Patient had R TKA in [place] in 2019. Patient recently admitted from 10/30-12/23 for dislocation of R knee prosthesis with septic joint. Closed reduction by Dr. 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 Ultimately underwent R AKA by Dr. Lajoyce Corners. This complicated by sepsis from E.Coli bacteremia, spent several days in ICU on vent. Also complicated by AKA wound dehissance.Patient finally discharged 12/23 to [facility] health care on rocephin to complete 6 weeks of therapy with end date of 1/6.   Robin Snow full recounting of the prolonged, nearly 2 month hospital stay, please see Dr. Dennison Nancy discharge summary on 12/23.  Per SNF: patient didn't get rocephinon(12/24). At around 7pm they noticed asmall amount ofbloodydrainage from dressingand noticedfoulodor from wound. At 745pm noticed dressing was saturated and at 9pm now oozing blood around dressing. Patient was noted to be running fever of 100.7 at facility. Facility confirms that coumadin is still on hold as per DC summary plan.  No fever in ED, lab work actually looks fairly stable compared to 2 days ago.  CT of RLE femur is worrisome for hematoma with probable superimposed infection extending from amputation site.She was admitted under hospital service with Ortho to consult. She was given Rocephin and vancomycin in ED. She is scheduled to have right hip disarticulation with Dr. Lajoyce Corners.  She underwent hip disarticulation in the OR on 04/05/19 and postoperatively she had some hemorrhagic shock and became hypotensive received 3 units of albumin Neo-Synephrine as well as 3 units of PRBCs and was transferred to the intensive care unit for further monitoring.  She  is now hemodynamically stable and improved and has been transferred to PCU.  She is much more awake and alert and does have some anemia which we will continue to monitor and is stabilized.  PT OT recommending skilled nursing facility.  I spoke with orthopedics Dr. Lajoyce Corners who recommends resuming her home anticoagulation and she will be started on a heparin drip with a Coumadin bridge given her elevated CHA2DS2-VASc score of 7.  Need to closely monitor her anticoagulation as her platelet count dropped a little bit but her hemoglobin has remained stable.  Orthopedic surgery has transfused the patient with 2 units PRBC's in preparation to take her back to the OR on 04/11/2018 for repeat debridement. She underwent this debridement today with placement of the wound vac.   Due to the patient extremely complicated course, and her baseline multiple comorbidities which present as challenges to her survival and recovery from this illness, palliative care has been consulted.  Consultants  . Orthopedic surgery . Infectious disease . PCCM  Procedures  . Disarticulation of right hip with dehiscence and infection of stump . Wound VAC placement  Antibiotics   Anti-infectives (From admission, onward)   Start     Dose/Rate Route Frequency Ordered Stop   04/14/19 1400  ceFAZolin (ANCEF) IVPB 2g/100 mL premix     2 g 200 mL/hr over 30 Minutes Intravenous Every 8 hours 04/14/19 1024     04/13/19 0600  ceFAZolin (ANCEF) IVPB 2g/100 mL premix     2 g 200 mL/hr over 30 Minutes Intravenous On call to O.R. 04/12/19 1214 04/12/19 1629   04/10/19 2000  vancomycin (VANCOREADY) IVPB 1750 mg/350 mL  Status:  Discontinued     1,750 mg  PROGRESS NOTE  Robin Snow ERD:[id number] DOB: 02-18-1941 DOA: 03/31/2019 PCP: [name], MD  Brief History   Robin Snow a 79 y.o.femalewith medical history significant ofa.fib, HTN, CKD, stroke. Patient had R TKA in [place] in 2019. Patient recently admitted from 10/30-12/23 for dislocation of R knee prosthesis with septic joint. Closed reduction by Dr. 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 Ultimately underwent R AKA by [name]. This complicated by sepsis from E.Coli bacteremia, spent several days in ICU on vent. Also complicated by AKA wound dehissance.Patient finally discharged 12/23 to [facility] care on rocephin to complete 6 weeks of therapy with end date of 1/6.   Robin Snow full recounting of the prolonged, nearly 2 month hospital stay, please see Dr. Lisbeth Ply discharge summary on 12/23.  Per SNF: patient didn't get rocephinon(12/24). At around 7pm they noticed asmall amount ofbloodydrainage from dressingand noticedfoulodor from wound. At 745pm noticed dressing was saturated and at 9pm now oozing blood around dressing. Patient was noted to be running fever of 100.7 at facility. Facility confirms that coumadin is still on hold as per DC summary plan.  No fever in ED, lab work actually looks fairly stable compared to 2 days ago.  CT of RLE femur is worrisome for hematoma with probable superimposed infection extending from amputation site.She was admitted under hospital service with Ortho to consult. She was given Rocephin and vancomycin in ED. She is scheduled to have right hip disarticulation with [name].  She underwent hip disarticulation in the OR on 04/05/19 and postoperatively she had some hemorrhagic shock and became hypotensive received 3 units of albumin Neo-Synephrine as well as 3 units of PRBCs and was transferred to the intensive care unit for further monitoring.  She  is now hemodynamically stable and improved and has been transferred to PCU.  She is much more awake and alert and does have some anemia which we will continue to monitor and is stabilized.  PT OT recommending skilled nursing facility.  I spoke with orthopedics [name] who recommends resuming her home anticoagulation and she will be started on a heparin drip with a Coumadin bridge given her elevated CHA2DS2-VASc score of 7.  Need to closely monitor her anticoagulation as her platelet count dropped a little bit but her hemoglobin has remained stable.  Orthopedic surgery has transfused the patient with 2 units PRBC's in preparation to take her back to the OR on 04/11/2018 for repeat debridement. She underwent this debridement today with placement of the wound vac.   Due to the patient extremely complicated course, and her baseline multiple comorbidities which present as challenges to her survival and recovery from this illness, palliative care has been consulted.  Consultants  . Orthopedic surgery . Infectious disease . PCCM  Procedures  . Disarticulation of right hip with dehiscence and infection of stump . Wound VAC placement  Antibiotics   Anti-infectives (From admission, onward)   Start     Dose/Rate Route Frequency Ordered Stop   04/14/19 1400  ceFAZolin (ANCEF) IVPB 2g/100 mL premix     2 g 200 mL/hr over 30 Minutes Intravenous Every 8 hours 04/14/19 1024     04/13/19 0600  ceFAZolin (ANCEF) IVPB 2g/100 mL premix     2 g 200 mL/hr over 30 Minutes Intravenous On call to O.R. 04/12/19 1214 04/12/19 1629   04/10/19 2000  vancomycin (VANCOREADY) IVPB 1750 mg/350 mL  Status:  Discontinued     1,750 mg  PROGRESS NOTE  Robin Snow ERD:[id number] DOB: 02-18-1941 DOA: 03/31/2019 PCP: [name], MD  Brief History   Robin Snow a 79 y.o.femalewith medical history significant ofa.fib, HTN, CKD, stroke. Patient had R TKA in [place] in 2019. Patient recently admitted from 10/30-12/23 for dislocation of R knee prosthesis with septic joint. Closed reduction by Dr. 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 Ultimately underwent R AKA by [name]. This complicated by sepsis from E.Coli bacteremia, spent several days in ICU on vent. Also complicated by AKA wound dehissance.Patient finally discharged 12/23 to [facility] care on rocephin to complete 6 weeks of therapy with end date of 1/6.   Robin Snow full recounting of the prolonged, nearly 2 month hospital stay, please see Dr. Lisbeth Ply discharge summary on 12/23.  Per SNF: patient didn't get rocephinon(12/24). At around 7pm they noticed asmall amount ofbloodydrainage from dressingand noticedfoulodor from wound. At 745pm noticed dressing was saturated and at 9pm now oozing blood around dressing. Patient was noted to be running fever of 100.7 at facility. Facility confirms that coumadin is still on hold as per DC summary plan.  No fever in ED, lab work actually looks fairly stable compared to 2 days ago.  CT of RLE femur is worrisome for hematoma with probable superimposed infection extending from amputation site.She was admitted under hospital service with Ortho to consult. She was given Rocephin and vancomycin in ED. She is scheduled to have right hip disarticulation with [name].  She underwent hip disarticulation in the OR on 04/05/19 and postoperatively she had some hemorrhagic shock and became hypotensive received 3 units of albumin Neo-Synephrine as well as 3 units of PRBCs and was transferred to the intensive care unit for further monitoring.  She  is now hemodynamically stable and improved and has been transferred to PCU.  She is much more awake and alert and does have some anemia which we will continue to monitor and is stabilized.  PT OT recommending skilled nursing facility.  I spoke with orthopedics [name] who recommends resuming her home anticoagulation and she will be started on a heparin drip with a Coumadin bridge given her elevated CHA2DS2-VASc score of 7.  Need to closely monitor her anticoagulation as her platelet count dropped a little bit but her hemoglobin has remained stable.  Orthopedic surgery has transfused the patient with 2 units PRBC's in preparation to take her back to the OR on 04/11/2018 for repeat debridement. She underwent this debridement today with placement of the wound vac.   Due to the patient extremely complicated course, and her baseline multiple comorbidities which present as challenges to her survival and recovery from this illness, palliative care has been consulted.  Consultants  . Orthopedic surgery . Infectious disease . PCCM  Procedures  . Disarticulation of right hip with dehiscence and infection of stump . Wound VAC placement  Antibiotics   Anti-infectives (From admission, onward)   Start     Dose/Rate Route Frequency Ordered Stop   04/14/19 1400  ceFAZolin (ANCEF) IVPB 2g/100 mL premix     2 g 200 mL/hr over 30 Minutes Intravenous Every 8 hours 04/14/19 1024     04/13/19 0600  ceFAZolin (ANCEF) IVPB 2g/100 mL premix     2 g 200 mL/hr over 30 Minutes Intravenous On call to O.R. 04/12/19 1214 04/12/19 1629   04/10/19 2000  vancomycin (VANCOREADY) IVPB 1750 mg/350 mL  Status:  Discontinued     1,750 mg  PROGRESS NOTE  Robin Snow ERD:[id number] DOB: 02-18-1941 DOA: 03/31/2019 PCP: [name], MD  Brief History   Robin Snow a 79 y.o.femalewith medical history significant ofa.fib, HTN, CKD, stroke. Patient had R TKA in [place] in 2019. Patient recently admitted from 10/30-12/23 for dislocation of R knee prosthesis with septic joint. Closed reduction by Dr. 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 Ultimately underwent R AKA by [name]. This complicated by sepsis from E.Coli bacteremia, spent several days in ICU on vent. Also complicated by AKA wound dehissance.Patient finally discharged 12/23 to [facility] care on rocephin to complete 6 weeks of therapy with end date of 1/6.   Robin Snow full recounting of the prolonged, nearly 2 month hospital stay, please see Dr. Lisbeth Ply discharge summary on 12/23.  Per SNF: patient didn't get rocephinon(12/24). At around 7pm they noticed asmall amount ofbloodydrainage from dressingand noticedfoulodor from wound. At 745pm noticed dressing was saturated and at 9pm now oozing blood around dressing. Patient was noted to be running fever of 100.7 at facility. Facility confirms that coumadin is still on hold as per DC summary plan.  No fever in ED, lab work actually looks fairly stable compared to 2 days ago.  CT of RLE femur is worrisome for hematoma with probable superimposed infection extending from amputation site.She was admitted under hospital service with Ortho to consult. She was given Rocephin and vancomycin in ED. She is scheduled to have right hip disarticulation with [name].  She underwent hip disarticulation in the OR on 04/05/19 and postoperatively she had some hemorrhagic shock and became hypotensive received 3 units of albumin Neo-Synephrine as well as 3 units of PRBCs and was transferred to the intensive care unit for further monitoring.  She  is now hemodynamically stable and improved and has been transferred to PCU.  She is much more awake and alert and does have some anemia which we will continue to monitor and is stabilized.  PT OT recommending skilled nursing facility.  I spoke with orthopedics [name] who recommends resuming her home anticoagulation and she will be started on a heparin drip with a Coumadin bridge given her elevated CHA2DS2-VASc score of 7.  Need to closely monitor her anticoagulation as her platelet count dropped a little bit but her hemoglobin has remained stable.  Orthopedic surgery has transfused the patient with 2 units PRBC's in preparation to take her back to the OR on 04/11/2018 for repeat debridement. She underwent this debridement today with placement of the wound vac.   Due to the patient extremely complicated course, and her baseline multiple comorbidities which present as challenges to her survival and recovery from this illness, palliative care has been consulted.  Consultants  . Orthopedic surgery . Infectious disease . PCCM  Procedures  . Disarticulation of right hip with dehiscence and infection of stump . Wound VAC placement  Antibiotics   Anti-infectives (From admission, onward)   Start     Dose/Rate Route Frequency Ordered Stop   04/14/19 1400  ceFAZolin (ANCEF) IVPB 2g/100 mL premix     2 g 200 mL/hr over 30 Minutes Intravenous Every 8 hours 04/14/19 1024     04/13/19 0600  ceFAZolin (ANCEF) IVPB 2g/100 mL premix     2 g 200 mL/hr over 30 Minutes Intravenous On call to O.R. 04/12/19 1214 04/12/19 1629   04/10/19 2000  vancomycin (VANCOREADY) IVPB 1750 mg/350 mL  Status:  Discontinued     1,750 mg  anemia.  Code Status: FULL CODE  Family Communication: No family present at bedside  Disposition Plan: SNF When bed available  Jocelyn Nold, DO [facility] Direct contact: see [email or web address]  7PM-7AM contact night coverage as above 04/16/2019, 1:17 PM  LOS: 10 days   ADDENDUM: Given this patients unfortunate course, her poor quality of life, and her mutiple severe comorbidities I believe that the appropriate course would be to continue to treat what is treatable, but not to escalate care or resort to extreme measures should she decline further. I believe it would be appropriate for this patient to be a DNR for the reason that resuscitative efforts would not be beneficial to the patient and may causse significant harm.

## 2019-04-16 NOTE — Progress Notes (Addendum)
   Palliative Medicine Inpatient Follow Up Note   HPI: Per Intake H&P --> Robin Snow a 79 y.o.femalewith medical history significant ofa.fib, HTN, CKD, stroke. Patient had R TKA in [place] in 2019. Patient recently admitted from 10/30-12/23 for dislocation of R knee prosthesis with septic joint. Closed reduction by Dr. 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 Ultimately underwent R AKA by Dr. Lajoyce Snow. This complicated by sepsis from E.Coli bacteremia, spent several days in ICU on vent. Also complicated by AKA wound dehissance.Patient finally discharged 12/23 to [facility] health care on rocephin to complete 6 weeks of therapy with end date of 1/6.  Today's Discussion (04/16/2019): Chart reviewed. Patient evaluated at bedside, remains somnolent and unresponsive. Talked to patients bedside RN, Robin Snow. Patient remains to have serosanguinous drainage from wound vac. Concern on nursing side is significant for possible deterioration.   Called patients daughter, Robin Snow to have an in person GOC meeting given her mothers present health state and lack of improvement. She and her brothers are unable to come in today. They stated that they can come after work tomorrow. We will continue to try to accommodate meeting with them.   Discussed the importance of continued conversation with family and their  medical providers regarding overall plan of care and treatment options, ensuring decisions are within the context of the patients values and GOCs.   Subjective Assessment: Remains unresponsive. Appears to wince with rolls.  Vital Signs Vitals:   04/16/19 1220 04/16/19 1626  BP: (!) 121/55 (!) 117/53  Pulse: 66 63  Resp: 13 14  Temp: 97.6 F (36.4 C) 97.7 F (36.5 C)  SpO2: 97% 97%   Gen: Robin Snow appears stated age  HEENT: Dry mucous membranes, Cortrack in place CV: Regular rate and rhythm, no murmurs rubs or gallops PULM: clear  to auscultation bilaterally  ABD: soft/nontender  EXT: (+) LLE edema Neuro: Somnolent, nonarousable  Recommendations and Plan: GOC: -Family meeting to take place possibly tomorrow  Deconditioning: - PT/OT consults  RLE Pain: - Tylenol 650mg  NGT TID - Continue oxycodone 5-10mg  PO Q4H - Dilaudid 0.5-1mg  IV Q4H PRN  Polypharmacy: - Stop unnecessary PRNs, Reglan , methocarbamol, trmadol DC'd  Delirium: - Strict delirium precuations  Discussed with Dr.  Time Spent: 25 Greater than 50% of the time was spent in counseling and coordination of care ______________________________________________________________________________________ [name] Palliative Medicine Team Team Cell Phone: [phone] Please utilize secure chat with additional questions, if there is no response within 30 minutes please call the above phone number  Palliative Medicine Team providers are available by phone from 7am to 7pm daily and can be reached through the team cell phone.  Should this patient require assistance outside of these hours, please call the patient's attending physician.

## 2019-04-17 DIAGNOSIS — Z7189 Other specified counseling: Secondary | ICD-10-CM

## 2019-04-17 DIAGNOSIS — I358 Other nonrheumatic aortic valve disorders: Secondary | ICD-10-CM | POA: Diagnosis present

## 2019-04-17 DIAGNOSIS — Z89621 Acquired absence of right hip joint: Secondary | ICD-10-CM

## 2019-04-17 DIAGNOSIS — Z515 Encounter for palliative care: Secondary | ICD-10-CM

## 2019-04-17 DIAGNOSIS — R601 Generalized edema: Secondary | ICD-10-CM

## 2019-04-17 LAB — BASIC METABOLIC PANEL
Anion gap: 7 (ref 5–15)
BUN: 43 mg/dL — ABNORMAL HIGH (ref 8–23)
CO2: 22 mmol/L (ref 22–32)
Calcium: 7.7 mg/dL — ABNORMAL LOW (ref 8.9–10.3)
Chloride: 119 mmol/L — ABNORMAL HIGH (ref 98–111)
Creatinine, Ser: 1.19 mg/dL — ABNORMAL HIGH (ref 0.44–1.00)
GFR calc Af Amer: 50 mL/min — ABNORMAL LOW (ref >60)
GFR calc non Af Amer: 43 mL/min — ABNORMAL LOW (ref >60)
Glucose, Bld: 158 mg/dL — ABNORMAL HIGH (ref 70–99)
Potassium: 3.5 mmol/L (ref 3.5–5.1)
Sodium: 148 mmol/L — ABNORMAL HIGH (ref 135–145)

## 2019-04-17 LAB — PROTIME-INR
INR: 1.5 — ABNORMAL HIGH (ref 0.8–1.2)
Prothrombin Time: 18.1 seconds — ABNORMAL HIGH (ref 11.4–15.2)

## 2019-04-17 LAB — GLUCOSE, CAPILLARY
Glucose-Capillary: 116 mg/dL — ABNORMAL HIGH (ref 70–99)
Glucose-Capillary: 127 mg/dL — ABNORMAL HIGH (ref 70–99)
Glucose-Capillary: 140 mg/dL — ABNORMAL HIGH (ref 70–99)
Glucose-Capillary: 177 mg/dL — ABNORMAL HIGH (ref 70–99)

## 2019-04-17 MED ORDER — ATROPINE SULFATE 1 % OP SOLN
2.0000 [drp] | Freq: Four times a day (QID) | OPHTHALMIC | Status: DC | PRN
  Administered 2019-04-17: 17:00:00 2 [drp] via SUBLINGUAL
  Filled 2019-04-17: qty 2

## 2019-04-17 MED ORDER — ACETAMINOPHEN 650 MG RE SUPP: 650.0000 mg | RECTAL | Status: DC | PRN

## 2019-04-17 MED ORDER — HYDROMORPHONE HCL 1 MG/ML IJ SOLN
0.5000 mg | INTRAMUSCULAR | Status: DC | PRN
  Administered 2019-04-17 – 2019-04-18 (×2): 1 mg via INTRAVENOUS
  Filled 2019-04-17 (×2): qty 1

## 2019-04-17 MED ORDER — SCOPOLAMINE 1 MG/3DAYS TD PT72
1.0000 | MEDICATED_PATCH | TRANSDERMAL | Status: DC
  Filled 2019-04-17: qty 1

## 2019-04-17 MED ORDER — MORPHINE SULFATE (CONCENTRATE) 10 MG/0.5ML PO SOLN
2.6000 mg | ORAL | Status: DC
  Administered 2019-04-17 – 2019-04-18 (×2): 2.6 mg via ORAL
  Filled 2019-04-17 (×3): qty 0.5

## 2019-04-17 MED ORDER — POLYVINYL ALCOHOL 1.4 % OP SOLN
2.0000 [drp] | OPHTHALMIC | Status: DC | PRN
  Filled 2019-04-17: qty 15

## 2019-04-17 NOTE — Progress Notes (Signed)
Nutrition Brief Note  Chart reviewed. Pt now transitioning to comfort care.  No further nutrition interventions warranted at this time.  Please re-consult as needed.   Aryn Safran RD, LDN Clinical Nutrition Pager # - [phone]    

## 2019-04-17 NOTE — Progress Notes (Addendum)
PROGRESS NOTE  ADASHA DAYMON BJY:[id number] DOB: 06-12-1940 DOA: 03/31/2019 PCP: [name], MD  Brief History   Robin Snow a 79 y.o.femalewith medical history significant ofa.fib, HTN, CKD, stroke. Patient had R TKA in [place] in 2019. Patient recently admitted from 10/30-12/23 for dislocation of R knee prosthesis with septic joint. Closed reduction by Dr. 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 Ultimately underwent R AKA by Dr. Lajoyce Corners. This complicated by sepsis from E.Coli bacteremia, spent several days in ICU on vent. Also complicated by AKA wound dehissance.Patient finally discharged 12/23 to [facility] health care on rocephin to complete 6 weeks of therapy with end date of 1/6.   Robin Snow full recounting of the prolonged, nearly 2 month hospital stay, please see Dr. Dennison Nancy discharge summary on 12/23.  Per SNF: patient didn't get rocephinon(12/24). At around 7pm they noticed asmall amount ofbloodydrainage from dressingand noticedfoulodor from wound. At 745pm noticed dressing was saturated and at 9pm now oozing blood around dressing. Patient was noted to be running fever of 100.7 at facility. Facility confirms that coumadin is still on hold as per DC summary plan.  No fever in ED, lab work actually looks fairly stable compared to 2 days ago.  CT of RLE femur is worrisome for hematoma with probable superimposed infection extending from amputation site.She was admitted under hospital service with Ortho to consult. She was given Rocephin and vancomycin in ED. She is scheduled to have right hip disarticulation with Dr. Lajoyce Corners.  She underwent hip disarticulation in the OR on 04/05/19 and postoperatively she had some hemorrhagic shock and became hypotensive received 3 units of albumin Neo-Synephrine as well as 3 units of PRBCs and was transferred to the intensive care unit for further monitoring.  She  is now hemodynamically stable and improved and has been transferred to PCU.  She is much more awake and alert and does have some anemia which we will continue to monitor and is stabilized.  PT OT recommending skilled nursing facility.  I spoke with orthopedics Dr. Lajoyce Corners who recommends resuming her home anticoagulation and she will be started on a heparin drip with a Coumadin bridge given her elevated CHA2DS2-VASc score of 7.  Need to closely monitor her anticoagulation as her platelet count dropped a little bit but her hemoglobin has remained stable.  Orthopedic surgery has transfused the patient with 2 units PRBC's in preparation to take her back to the OR on 04/11/2018 for repeat debridement. She underwent this debridement today with placement of the wound vac.   Due to the patient extremely complicated course, and her baseline multiple comorbidities which present as challenges to her survival and recovery from this illness, palliative care has been consulted. Dr Lajoyce Corners, Dr. Orvan Falconer, and I agree that it is highly unlikely that this patient will survive this episode. She has been made a DNR. I fully support conversion to comfort care only.  Consultants   Orthopedic surgery  Infectious disease  PCCM  Procedures   Disarticulation of right hip with dehiscence and infection of stump  Wound VAC placement  Antibiotics   Anti-infectives (From admission, onward)   Start     Dose/Rate Route Frequency Ordered Stop   04/14/19 1400  ceFAZolin (ANCEF) IVPB 2g/100 mL premix     2 g 200 mL/hr over 30 Minutes Intravenous Every 8 hours 04/14/19 1024     04/13/19 0600  ceFAZolin (ANCEF) IVPB 2g/100 mL premix     2 g 200 mL/hr  over 30 Minutes Intravenous On call to O.R. 04/12/19 1214 04/12/19 1629   04/10/19 2000  vancomycin (VANCOREADY) IVPB 1750 mg/350 mL  Status:  Discontinued     1,750 mg 175 mL/hr over 120 Minutes Intravenous Every 24 hours 04/10/19 0847 04/10/19 0850   04/10/19 2000  vancomycin  (VANCOREADY) IVPB 1250 mg/250 mL  Status:  Discontinued     1,250 mg 166.7 mL/hr over 90 Minutes Intravenous Every 24 hours 04/10/19 0850 04/10/19 0943   04/09/19 2000  vancomycin (VANCOREADY) IVPB 1500 mg/300 mL  Status:  Discontinued     1,500 mg 150 mL/hr over 120 Minutes Intravenous Every 24 hours 04/09/19 1916 04/10/19 0847   04/09/19 2000  ceFEPIme (MAXIPIME) 2 g in sodium chloride 0.9 % 100 mL IVPB  Status:  Discontinued     2 g 200 mL/hr over 30 Minutes Intravenous Every 8 hours 04/09/19 1916 04/14/19 1024   04/05/19 1400  ceFAZolin (ANCEF) IVPB 2g/100 mL premix     2 g 200 mL/hr over 30 Minutes Intravenous On call to O.R. 04/05/19 0723 04/05/19 1100   04/01/19 0500  cefTRIAXone (ROCEPHIN) IVPB  Status:  Discontinued    Note to Pharmacy: Indication:  E Coli Endocarditis Last Day of Therapy:  04/12/2019 Labs - Once weekly:  CBC/D and BMP, Labs - Every other week:  ESR and CRP     2 g Intravenous Every 24 hours 03/31/19 0436 03/31/19 0500   04/01/19 0400  cefTRIAXone (ROCEPHIN) 2 g in sodium chloride 0.9 % 100 mL IVPB  Status:  Discontinued     2 g 200 mL/hr over 30 Minutes Intravenous Every 24 hours 03/31/19 0501 04/09/19 1916   03/31/19 0400  vancomycin (VANCOCIN) IVPB 1000 mg/200 mL premix     1,000 mg 200 mL/hr over 60 Minutes Intravenous  Once 03/31/19 0356 03/31/19 0606   03/31/19 0100  cefTRIAXone (ROCEPHIN) 2 g in sodium chloride 0.9 % 100 mL IVPB     2 g 200 mL/hr over 30 Minutes Intravenous  Once 03/31/19 0100 03/31/19 0329     Subjective  The patient is resting comfortably. She is non responsive for me again today.   Objective   Vitals:  Vitals:   04/17/19 0400 04/17/19 0506  BP: (!) 135/56 137/60  Pulse:  68  Resp: 19 20  Temp:  98.4 F (36.9 C)  SpO2:  99%   Exam:  Constitutional:   The patient is sleeping and not responsive again this morning. No acute distress. There is a foul small coming from the patient's wound.  Respiratory:   No increased  work of breathing.  No wheezes, rales, or rhonchi  No tactile fremitus Cardiovascular:   Regular rate and rhythm  No murmurs, ectopy, or gallups.  No lateral PMI. No thrills. Abdomen:   Abdomen is soft, non-tender, non-distended  No hernias, masses, or organomegaly  Normoactive bowel sounds.  Musculoskeletal:   No cyanosis, clubbing, or edema  Foul smelling right hip wound Skin:   No rashes, lesions, ulcers  palpation of skin: no induration or nodules  Wound VAC in place. Neurologic:             Unable to evaluate due to the patient's inability to cooperate with exam. Psychiatric:   Unable to evaluate due to the patient's inability to cooperate with exam.  I have personally reviewed the following:   Today's Data   Vitals, BMP, Glucoses.  Scheduled Meds:  sodium chloride   Intravenous Once   acetaminophen (TYLENOL)  oral liquid 160 mg/5 mL  650 mg Oral TID   amiodarone  200 mg Oral Daily   Chlorhexidine Gluconate Cloth  6 each Topical Daily   diltiazem  180 mg Oral Daily   docusate  100 mg Oral BID   feeding supplement (PRO-STAT SUGAR FREE 64)  30 mL Per Tube TID   ferrous sulfate  300 mg Oral BID WC   gabapentin  300 mg Oral Q12H   metoprolol tartrate  25 mg Oral BID   multivitamin with minerals  1 tablet Oral Daily   nutrition supplement (JUVEN)  1 packet Per Tube BID BM   pantoprazole  40 mg Oral Daily   pravastatin  20 mg Oral q1800   senna-docusate  2 tablet Oral BID   Continuous Infusions:  sodium chloride Stopped (04/16/19 1529)    ceFAZolin (ANCEF) IV 2 g (04/17/19 0457)   feeding supplement (OSMOLITE 1.5 CAL) 50 mL/hr at 04/17/19 3557   lactated ringers 10 mL/hr at 04/12/19 1250    Principal Problem:   Wound dehiscence Active Problems:   Essential hypertension   Goals of care, counseling/discussion   AF (paroxysmal atrial fibrillation) (HCC)   History of bacteremia due to Escherichia coli   Palliative care by  specialist   History of disarticulation of right hip   Aortic valve endocarditis   LOS: 17 days   A & P  Wound dehiscence/infection of amputation site/right stump status post hip disarticulation on 04/06/2019 postoperative day 5: The patient has a foul smell and swelling at stump site. Last evening she became febrile, tachypneic and tachycardic. She appeared septic. She was given IV fluids for this. Narcotic pain control was held due to patient lethargy. Infectious disease was consulted. Plans are for surgery to take him back for repeat I&D on 04/11/2018. ID has been consulted. The patient remains on IV cefepime at this time. The patient's hemoglobin again dropped abruptly this morning to 5.5 and is receiving another 2 units of PRBC's to make a total of 5 units PRBC's transfused so far. Wound VAC in in place with thick bloody effluent from wound indicating continue muscle breakdown. Per orthopedic surgery the patient had extensive necrotic muscles including muscles originating in the pelvis and tissue were sent for cultures and patient will need long-term antibiotics. The patient required transfer to ICU for post hemorrhagic shock after the surgery. She is now in the progressive care unit. She is receiving  IV Dilaudid 0.5 mg every 4 as needed for severe pain, p.o. oxycodone IR and oxycodone acetaminophen 1 tab p.o. twice daily as needed for pain control. Bowel regimen in place. Surgical pathology results showed a gangrenous necrosis of skin and soft tissue with atheroma formation with a luminal stenosis and dystrophic calcifications of vasculature and it stated that the gangrenous necrosis is extensive soft tissue resection margin. She has been converted from Rocephin to Cefepime IV. Infectious disease has been consulted as WBC continues to increase. Patient's VTE prophylaxis with coumadin is being bridged with heparin, but this has been held due to recent drop in hemoglobin. CT abdomen and pelvis has been  performed on 04/08/2018. It has demonstrated mottled air and heterogeneous fluid in the operative bed extending from the hip joint space to the skin surface. Small peripherally enhancing fluid collections just deep to the incision inferiorly, largest collection measures 3.4 cm greatest dimension. While findings may be related to recent surgery, sterility is indeterminate by imaging, and mottled air may represent infection. Heterogeneous enlargement and mottled  air involving the right obturator, gluteal and iliopsoas muscles, may be postoperative or infectious. There is no evidence of active bleeding. Also body wall edema has slightly increased from prior exam. Blood cultures were repeated. The patient has returned to the OR with orthopedic surgery today for repeat debridement. She is being transfused with one unit of PRBC's. Wound VAC is placed. Drainage is increasing and foul smelling.  Sepsis: Pt with decreased level of consciousness this morning with hypothermia, bradycardia, and hypotension. Leukocytosis of 23.2.[name] Improved consciousness with iV fluid bolus. Monitor. Infectious disease observed the patient with small tremors today. They are concerned that cefepime is the cause for this. They have changed her to antibiotic to Ancef. I appreciate their help.  Acute Blood Loss Anemia from Surgical Intervention: Overnight hemoglobin dropped from 7.7 to 5.5 this morning. She was given 2 units PRBC's this morning in addition to the 3 units she had received earlier in this stay. Patient has thick bloody effluent from Wounds Vac. This is the presumed source of blood loss. Iron deficiency is being supplemented, and hemoglobin will continue to be monitored as necessary. Heparin has been held. It was being used to bridge until the patient was therapeutic on coumadin. CHADSVASC2 score is 7. She received 2 units of PRBC's in preparation for return to OR. She is being transfused with one unit of PRBC's post-operatively as  well. Post op hemoglobin was 10.2. Today this is decreased to 8.3. Monitor and transfuse as necessary.  E. coli Bacteremia/Endocarditis: She has been converted to IV cefepime. I appreciate  Infectious disease's assistance.   Permanent Atrial Fibrillation : Heart rates have been relatively low today ranging from 59 to 60. I will reduce dose of metoprolol. Heparin has been held due to abrupt drop in hemoglobin. Monitor heart rates and electrolytes. CHA2DS2-VASc of 7.  Essential Hypertension: Blood pressures normotensive today after blood transfusion and reduction of metoprolol dose. Monitor.   Obesity with severe malnutrition in the context of acute illness/injury: Complicates all cares. Dietary has been consulted. They have recommended  Enlive p.o. 3 times daily along with Magic cup 3 times daily with meals and multivitamin with minerals daily as well as liberalizing diet to help improve p.o. intake. Will place cortrak ans request dietician to make recommendation for tube feeds to optimally provide for healing.  Hyperlipidemia: Continue Pravastatin 20 g p.o. nightly.  GERD: Continue Pantoprazole 40 mg p.o. daily.  Hypomagnesemia: 1.7 today. Monitor and replace as necessary.  Hyperbilirubinemia: Resolved and likely reactive. Monitor intermittently.   Leukocytosis, worsening: Due to complicated wound situation. Reactive in the setting of Surgical intervention and Hip Disarticulation. Pt is receiving IV cefepime. Will repeat echocardiogram as per infectious disease's recommendations. Repeat I & D on 04/11/2018.   Thrombocytopenia: Patient's platelet count dropped from 159 > 124 > 179 >165>178>157. Monitor.   Somnolence and Lethargy: Encephalopathy due to sepsis related to ongoing infectious issues related to right hip disarticulation/wound infectious. CT head was checked and did not demonstrate any acute pathology. The patient was awake, alert, and communicative later in the day. Today  (04/14/2019) the patient is again sleeping soundly in the morning. Possibly this is just part of her pattern. Will observe for signs of more serious causes of encephalopathy.  I have seen and examined this patient myself. I have spent 33 minutes in her evaluation and care.  DVT prophylaxis: SCDs. Heparin had been used to bridge the patient while he was becoming therapeutic on coumadin. However, it has been stopped given  blood loss anemia.  Code Status: DNR Family Communication: No family present at bedside  Disposition Plan: Due to the patient extremely complicated course, and her baseline multiple comorbidities which present as challenges to her survival and recovery from this illness, palliative care has been consulted. Dr Lajoyce Corners, Dr. Orvan Falconer, and I agree that it is highly unlikely that this patient will survive this episode. She has been made a DNR. I fully support conversion to comfort care only. Palliative care will discuss with family later today.  Jla Reynolds, DO [facility] Direct contact: see [email or web address]  7PM-7AM contact night coverage as above 04/17/2019, 2:33 PM  LOS: 10 days

## 2019-04-17 NOTE — Progress Notes (Signed)
Palliative: Robin Snow is lying quietly in bed.  She appears acutely/chronically ill, frail, obese.  She is quite swollen, third spacing.  It takes several tries for me to awaken her.  She will make an somewhat keep eye contact, but is oriented to self only at this time.  She does not answer any of my other questions, does not try to communicate with me.  There is no family at bedside at this time.  Call to daughter, Robin Snow at [phone].  We talked in detail about Robin Snow's current health concerns including but not limited to bleeding from stump site, bedbound status, artificial nutrition/hydration via [place], poor nutritional status with albumin of 1.4.  We also talked about "first do no harm".  I share with Robin Snow that the medical team is continuing to treat Robin Snow, but would not perform extraordinary measures such as CPR or intubation (DNR status).  Robin Snow shares that she felt like they were coming near time to make this choice, she seems accepting.  I reassure her that we are continuing to take care of Robin Snow.  Robin Snow tells me that her brother Robin Snow is hospitalized now with pneumonia and is now a "tube feeder".  She states that she will be doing his tube feeding when he returns home.  He should be returning home today, she is taking a week off of work to care for him.  Robin Snow tells me that her brother Robin Snow is the "power of attorney", and he is at work at this time.  I ask if Robin Snow is healthcare or money power of attorney, but she deflects stating that she has lived with "mom umpteen years" and helps take care of her.  We talked about calling Robin Snow to set up a phone conference, she tells me he gets off work at 4 PM.  Robin Snow states they also have another brother, Robin Snow who gets off work at 1 PM.  Note from nursing staff stating family would like to talk about comfort care.   Call to son, Robin Snow in chart) Robin Snow at [phone].  We talk about comfort  care, medications focused on comfort only, no further invasive/painful treatments, let nature take it's course.  Robin Snow agrees to comfort care.  We talk about residential hospice, choice offered, family requests [facility] referral.    Visitors:  Robin Snow, Robin Snow, Robin Snow will come visit. Robin Snow may come in place of Robin Snow.   Conference with attending, Editor, commissioning, bedside nursing staff related to patient condition, needs, DNR status.  Plan:   Comfort and dignity at end of life, residential hospice.  DNR status changed earlier in day after conference with attending and consulting orthopedist. Prognosis:  Two weeks or less expected based on servity of illness, poor functional status, worsening infection, low albumin.   75 minutes, extended time.  Lillia Carmel, NP Palliative Medicine Team Team Phone # [phone] Greater than 50% of this time was spent counseling and coordinating care related to the above assessment and plan.

## 2019-04-17 NOTE — TOC Progression Note (Addendum)
Transition of Care [facility]) - Progression Note    Patient Details  Name: Robin Snow MRN: [id number] Date of Birth: Oct 02, 1940  Transition of Care [facility]) CM/SW Contact  Eduard Roux, [place] Phone Number: 04/17/2019, 3:56 PM  Clinical Narrative:     CSW spoke with Corrie Dandy w/hospice- made referral to [facility]. CSW spoke with the patient's son, Kevin Fenton - he confirmed plan. [facility] will review and contact regarding availability.   Antony Blackbird, MSW, LCSWA Clinical Social Worker    Expected Discharge Plan: Skilled Nursing Facility Barriers to Discharge: Continued Medical Work up  Expected Discharge Plan and Services Expected Discharge Plan: Skilled Nursing Facility       Living arrangements for the past 2 months: Skilled Nursing Facility                                       Social Determinants of Health (SDOH) Interventions    Readmission Risk Interventions No flowsheet data found.

## 2019-04-17 NOTE — Progress Notes (Signed)
PT Cancellation Note  Patient Details Name: Robin Snow MRN: [id number] DOB: 02-May-1940   Cancelled Treatment:    Reason Eval/Treat Not Completed: Patient's level of consciousness;Other (comment)(Pt progressing with palliative care). Per RN, the pt is still unable to participate in therapy sessions at this time due to minimal responsiveness. He asked PT to hold until family and palliative meet to discuss POC, including further wishes regarding need for PT. PT will continue to follow as time/schedule allow.   Update: Since earlier conversation with the pt's RN, PT orders have since been discontinued. Please feel free to re-consult if there is a change in pt status or our services are desired.   Rolm Baptise, PT, DPT   Acute Rehabilitation Department [phone]   Gaetana Michaelis 04/17/2019, 3:41 PM

## 2019-04-17 NOTE — Progress Notes (Signed)
Patient ID: Robin Snow, female   DOB: Aug 29, 1940, 79 y.o.   MRN: [id number] I have aggressively treated the treatable infection in the hip disarticulation.  I do not feel there is any further surgical intervention that would be beneficial to the patient and any further surgical intervention could cause harm to the patient.  I feel the best option is to provide comfort care and allow natural passing.

## 2019-04-17 NOTE — TOC Progression Note (Signed)
Transition of Care [facility]) - Progression Note    Patient Details  Name: DEZIAH RENWICK MRN: [id number] Date of Birth: 1940/06/15  Transition of Care [facility]) CM/SW Contact  Eduard Roux, [place] Phone Number: 04/17/2019, 4:55 PM  Clinical Narrative:     [facility] is able to accept patient tomorrow. Family will meet with Hospice tomorrow morning to complete admission paperwork.  Antony Blackbird, MSW, LCSWA Clinical Social Worker   Expected Discharge Plan: Skilled Nursing Facility Barriers to Discharge: Continued Medical Work up  Expected Discharge Plan and Services Expected Discharge Plan: Skilled Nursing Facility       Living arrangements for the past 2 months: Skilled Nursing Facility                                       Social Determinants of Health (SDOH) Interventions    Readmission Risk Interventions No flowsheet data found.

## 2019-04-17 NOTE — Progress Notes (Signed)
Patient ID: Robin Snow, female   DOB: 12/18/1940, 79 y.o.   MRN: [id number]         [facility]  Date of Admission:  03/31/2019   Total days of antibiotics 45        Day 4 cefazolin         ASSESSMENT: She continues to do extremely poorly.  I do not see her recovering from this.  In agreement with transitioning to comfort measures only.  PLAN: 1. Continue cefazolin pending further discussion with her family  Principal Problem:   Wound dehiscence Active Problems:   History of bacteremia due to Escherichia coli   History of disarticulation of right hip   Aortic valve endocarditis   Essential hypertension   Goals of care, counseling/discussion   AF (paroxysmal atrial fibrillation) (HCC)   Palliative care by specialist   Scheduled Meds: . sodium chloride   Intravenous Once  . acetaminophen (TYLENOL) oral liquid 160 mg/5 mL  650 mg Oral TID  . amiodarone  200 mg Oral Daily  . Chlorhexidine Gluconate Cloth  6 each Topical Daily  . diltiazem  180 mg Oral Daily  . docusate  100 mg Oral BID  . feeding supplement (PRO-STAT SUGAR FREE 64)  30 mL Per Tube TID  . ferrous sulfate  300 mg Oral BID WC  . gabapentin  300 mg Oral Q12H  . metoprolol tartrate  25 mg Oral BID  . multivitamin with minerals  1 tablet Oral Daily  . nutrition supplement (JUVEN)  1 packet Per Tube BID BM  . pantoprazole  40 mg Oral Daily  . pravastatin  20 mg Oral q1800  . senna-docusate  2 tablet Oral BID   Continuous Infusions: . sodium chloride Stopped (04/16/19 1529)  .  ceFAZolin (ANCEF) IV 2 g (04/17/19 0457)  . feeding supplement (OSMOLITE 1.5 CAL) 50 mL/hr at 04/17/19 0635  . lactated ringers 10 mL/hr at 04/12/19 1250   PRN Meds:.bisacodyl, HYDROmorphone (DILAUDID) injection, magnesium citrate, ondansetron **OR** ondansetron (ZOFRAN) IV, oxyCODONE, oxyCODONE, polyethylene glycol  Review of Systems: Review of Systems  Unable to perform ROS: Mental acuity     Allergies  Allergen Reactions  . Aspirin Hives and Swelling    Angioedema   . Rofecoxib Hives  . Hydrocodone Hives    Tolerates oxycodone and tramadol    OBJECTIVE: Vitals:   04/17/19 0200 04/17/19 0400 04/17/19 0500 04/17/19 0506  BP: (!) 120/57 (!) 135/56  137/60  Pulse:    68  Resp: 18 19  20   Temp:    98.4 F (36.9 C)  TempSrc:    Axillary  SpO2:    99%  Weight:   86.6 kg   Height:       Body mass index is 29.9 kg/m.  Physical Exam Constitutional:      Comments: She remained very lethargic and largely unresponsive.  Musculoskeletal:     Comments: A VAC dressing remains in place over her right hip disarticulation wound.  There is a very foul odor.     Lab Results Lab Results  Component Value Date   WBC 17.7 (H) 04/15/2019   HGB 7.1 (L) 04/15/2019   HCT 22.7 (L) 04/15/2019   MCV 91.9 04/15/2019   PLT 192 04/15/2019    Lab Results  Component Value Date   CREATININE 1.19 (H) 04/17/2019   BUN 43 (H) 04/17/2019   NA 148 (H) 04/17/2019   K 3.5 04/17/2019   CL 119 (H)  04/17/2019   CO2 22 04/17/2019    Lab Results  Component Value Date   ALT 9 04/15/2019   AST 17 04/15/2019   ALKPHOS 69 04/15/2019   BILITOT 0.4 04/15/2019     Microbiology: Recent Results (from the past 240 hour(s))  Culture, blood (routine x 2)     Status: None   Collection Time: 04/09/19  8:06 PM   Specimen: BLOOD  Result Value Ref Range Status   Specimen Description BLOOD LEFT ANTECUBITAL  Final   Special Requests   Final    BOTTLES DRAWN AEROBIC AND ANAEROBIC Blood Culture results may not be optimal due to an inadequate volume of blood received in culture bottles   Culture   Final    NO GROWTH 5 DAYS Performed at [facility], [place] [address], [place], [place] [address]    Report Status 04/14/2019 FINAL  Final  Culture, blood (routine x 2)     Status: None   Collection Time: 04/09/19  8:16 PM   Specimen: BLOOD  Result Value Ref Range Status   Specimen Description  BLOOD LEFT ANTECUBITAL  Final   Special Requests   Final    BOTTLES DRAWN AEROBIC AND ANAEROBIC Blood Culture results may not be optimal due to an inadequate volume of blood received in culture bottles   Culture   Final    NO GROWTH 5 DAYS Performed at [facility], [place] [address], [place], [place] [address]    Report Status 04/14/2019 FINAL  Final    Michel Bickers, MD [facility] [place] [facility] [phone] pager   [phone] cell 04/17/2019, 12:18 PM

## 2019-04-18 DIAGNOSIS — Z515 Encounter for palliative care: Secondary | ICD-10-CM

## 2019-04-18 MED ORDER — ATROPINE SULFATE 1 % OP SOLN: 2.0000 [drp] | Freq: Four times a day (QID) | OPHTHALMIC | 12 refills | Status: AC | PRN

## 2019-04-18 MED ORDER — SCOPOLAMINE 1 MG/3DAYS TD PT72: 1.0000 | MEDICATED_PATCH | TRANSDERMAL | 12 refills | Status: AC

## 2019-04-18 MED ORDER — ACETAMINOPHEN 650 MG RE SUPP: 650.0000 mg | RECTAL | 0 refills | Status: AC | PRN

## 2019-04-18 MED ORDER — MORPHINE SULFATE (CONCENTRATE) 10 MG/0.5ML PO SOLN: 2.6000 mg | ORAL | 0 refills | Status: AC

## 2019-04-18 MED ORDER — POLYVINYL ALCOHOL 1.4 % OP SOLN: 2.0000 [drp] | OPHTHALMIC | 0 refills | Status: AC | PRN

## 2019-04-18 NOTE — Progress Notes (Signed)
Report called to [facility].  NG feeding tube removed.  PICC line flushed and saline locked per [facility] request.  Wound vac disconnected, dressing left in place for patient comfort and reinforced to provide patient dignity while travelling to [employer].  Patient was resting peacefully after transfer to [facility] stretcher.  Patient to be transported to [facility] via [place].

## 2019-04-18 NOTE — Care Management Important Message (Signed)
Important Message  Patient Details  Name: Robin Snow MRN: [id number] Date of Birth: 11/23/1940   Medicare Important Message Given:  Yes     Renie Ora 04/18/2019, 11:18 AM

## 2019-04-18 NOTE — TOC Transition Note (Signed)
Transition of Care [facility]) - CM/SW Discharge Note   Patient Details  Name: Robin Snow MRN: [id number] Date of Birth: [birth year]  Transition of Care [facility]) CM/SW Contact:  Eduard Roux, LCSWA Phone Number: 04/18/2019, 11:32 AM   Clinical Narrative:     Patient will DC to: [facility]  DC Date: 04/18/2019 Family Notified: [name],daughter Transport By: Sharin Mons  Please call report prior to the patient leaving unit. RN, patient, and facility notified of DC. Discharge Summary sent to facility. RN given number for [phone]. Ambulance transport requested for patient.   Clinical Social Worker signing off. Antony Blackbird, MSW, LCSWA Clinical Social Worker    Final next level of care: Hospice Medical Facility Barriers to Discharge: No Barriers Identified   Patient Goals and CMS Choice        Discharge Placement              Patient chooses bed at: [facility]) Patient to be transferred to facility by: [facility] Name of family member notified: [name], daughter Patient and family notified of of transfer: 04/18/19  Discharge Plan and Services                                     Social Determinants of Health (SDOH) Interventions     Readmission Risk Interventions No flowsheet data found.

## 2019-04-18 NOTE — Progress Notes (Signed)
Palliative: Robin Snow is resting quietly in bed.  She appears comfortable, is lethargic, unable to make her basic needs known.  There is no family at bedside at this time. Conference with bedside nursing staff related to patient condition, needs.  Plan:   Comfort and dignity at end of life, residential hospice.  DNR status.   Robin Carmel, NP Palliative Medicine Team Team Phone # [phone] Greater than 50% of this time was spent counseling and coordinating care related to the above assessment and plan.

## 2019-04-18 NOTE — Plan of Care (Signed)
PLease see previous note

## 2019-04-18 NOTE — Progress Notes (Signed)
[facility] psychotherap[facility] Place room is available for patient this morning. [facility] manager Aram Beecham is aware. Plan to complete paperwork with family this morning. Will update Aram Beecham when paperwork is complete.   Please send discharge summary to [phone].  RN please call report to [facility] prior to patient leaving the unit at [phone].  Thank you,  Forrestine Him, LCSW [phone]  Geri [place]in[place] are listed daily on AMION under [facility]

## 2019-04-18 NOTE — Consult Note (Signed)
[facility] Inpatient Consult   04/18/2019  Robin Snow 24-Jan-1941 [id number]   Update Note:  Following this [facility] Medicare patient with extreme high risk score of 35% for unplanned readmission and hospitalizations.  Transition of care CM note [facility]  Per MD note, patient's family is in agreement with both DNR and comfort care. They wish for the patient to be discharged to a residential hospice facility.  Given this choice, patient will have full case management services through Hospice.   For questions, please call:  Karin Golden A. Merl Bommarito, BSN, RN-BC [facility] Liaison Cell: [phone]

## 2019-04-18 NOTE — Discharge Summary (Signed)
Physician Discharge Summary  Robin Snow:[id number] DOB: 1941/02/28 DOA: 03/31/2019  PCP: [name], MD  Admit date: 03/31/2019 Discharge date: 04/18/2019  Recommendations for Outpatient Follow-up:  1. Discharge to [facility] 2. Comfort Care Only  Follow-up Information    [name], MD In 1 week.   Specialty: Orthopedic Surgery Contact information: [address] [place] [address] [phone]          Discharge Diagnoses: Principal diagnosis is #1 1. S/P right hip disarticulation with wound dehiscence and infection 2.  Sepsis 3. Acute blood loss anemia - post operatively 4. E. Coli bacteremia/endocarditis 5. Hypertension 6. Obesity with severe malnutrition in the context of acute and severe illness 7. Somnolence and lethargy  Discharge Condition: Poor  Disposition: Residential hospice facility  Diet recommendation: As tolerated  Filed Weights   04/15/19 0400 04/16/19 0548 04/17/19 0500  Weight: 88.4 kg 86.5 kg 86.6 kg    History of present illness:  Robin Snow is a 79 y.o. female with medical history significant of a.fib, HTN, CKD, stroke. Patient had R TKA in [place] in 2019.  Patient recently admitted from 10/30-12/23 for dislocation of R knee prosthesis with septic joint.  Closed reduction by Dr. Jena Gauss 11/18. 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 Ultimately underwent R AKA by Dr. Lajoyce Corners.  This complicated by sepsis from E.Coli bacteremia, spent several days in ICU on vent.  Also complicated by AKA wound dehissance. Patient finally discharged 12/23 to [facility] health care on rocephin to complete 6 weeks of therapy with end date of 1/6. For a full recounting of the prolonged, nearly 2 month hospital stay, please see Dr. Dennison Nancy discharge summary on 12/23.  Per SNF: patient didn't get rocephin yesterday (12/24).  At around 7pm they noticed asmall amount ofbloodydrainage from  dressingand noticedfoulodor from wound. At 745pm noticed dressing was saturated and at 9pm now oozing blood around dressing. Patient was noted to be running fever of 100.7 at facility. Facility confirms that coumadin is still on hold as per DC summary plan. Per patient she has somewhat increased pain today compared to a couple of days ago in the leg. Though pain isnt severe she states.  Hospital Course:  Robin Snow a 79 y.o.femalewith medical history significant ofa.fib, HTN, CKD, stroke. Patient had R TKA in [place] in 2019. Patient recently admitted from 10/30-12/23 for dislocation of R knee prosthesis with septic joint. Closed reduction by Dr. 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 Ultimately underwent R AKA by Dr. Lajoyce Corners. This complicated by sepsis from E.Coli bacteremia, spent several days in ICU on vent. Also complicated by AKA wound dehissance.Patient finally discharged 12/23 to [facility] health care on rocephin to complete 6 weeks of therapy with end date of 1/6.   Robin Snow full recounting of the prolonged, nearly 2 month hospital stay, please see Dr. Dennison Nancy discharge summary on 12/23.  Per SNF: patient didn't get rocephinon(12/24). At around 7pm they noticed asmall amount ofbloodydrainage from dressingand noticedfoulodor from wound. At 745pm noticed dressing was saturated and at 9pm now oozing blood around dressing. Patient was noted to be running fever of 100.7 at facility. Facility confirms that coumadin is still on hold as per DC summary plan.  No fever in ED, lab work actually looks fairly stable compared to 2 days ago.  CT of RLE femur is worrisome for hematoma with probable superimposed infection extending from amputation site.She was admitted  under hospital service with Ortho to consult. She was given Rocephin and vancomycin in ED. She is scheduled to have right hip disarticulation with Dr.  Lajoyce Corners.  She underwent hip disarticulation in the OR on 04/05/19 and postoperatively she had some hemorrhagic shock and became hypotensive received 3 units of albumin Neo-Synephrine as well as 3 units of PRBCs and was transferred to the intensive care unit for further monitoring.  She is now hemodynamically stable and improved and has been transferred to PCU. She is much more awake and alert and does have some anemia which we will continue to monitor and is stabilized. PT OT recommending skilled nursing facility.  I spoke with orthopedics Dr. Lajoyce Corners who recommends resuming her home anticoagulation and she will be started on a heparin drip with a Coumadin bridge given her elevated CHA2DS2-VASc score of 7.Need to closely monitor her anticoagulation as her platelet count dropped a little bit but her hemoglobin has remained stable.  Orthopedic surgery has transfused the patient with 2 units PRBC's in preparation to take her back to the OR on 04/11/2018 for repeat debridement. She underwent this debridement today with placement of the wound vac.   Due to the patient extremely complicated course, and her baseline multiple comorbidities which present as challenges to her survival and recovery from this illness, palliative care has been consulted. Dr Lajoyce Corners, Dr. Orvan Falconer, and I agree that it is highly unlikely that this patient will survive this episode. She has been made a DNR. I fully support conversion to comfort care only.  The patient's family is in agreement with both DNR and comfort care. They wish for the patient to be discharged to a residential hospice facility. [facility] has a bed for the patient today.  Today's assessment: S: The patient is resting quietly. No new complaints. O: Vitals:  Vitals:   04/18/19 0053 04/18/19 0427  BP:  129/83  Pulse:  60  Resp: 11 13  Temp:  98.2 F (36.8 C)  SpO2:  100%   Constitutional:   The patient is sleeping and not responsive again this  morning. No acute distress. There is a foul small coming from the patient's wound.  Respiratory:   No increased work of breathing.  No wheezes, rales, or rhonchi  No tactile fremitus Cardiovascular:   Regular rate and rhythm  No murmurs, ectopy, or gallups.  No lateral PMI. No thrills. Abdomen:   Abdomen is soft, non-tender, non-distended  No hernias, masses, or organomegaly  Normoactive bowel sounds.  Musculoskeletal:   No cyanosis, clubbing, or edema  Foul smelling right hip wound Skin:   No rashes, lesions, ulcers  palpation of skin: no induration or nodules  Wound VAC in place. Neurologic:             Unable to evaluate due to the patient's inability to cooperate with exam. Psychiatric:   Unable to evaluate due to the patient's inability to cooperate with exam.   Discharge Instructions  Discharge Instructions    Activity as tolerated - No restrictions   Complete by: As directed    Call MD for:  severe uncontrolled pain   Complete by: As directed    Discharge instructions   Complete by: As directed    Comfort care only   Increase activity slowly   Complete by: As directed    Neg Press Wound Therapy / Incisional   Complete by: As directed    Show patient how to attach prevena vac     Allergies  as of 04/18/2019      Reactions   Aspirin Hives, Swelling   Angioedema   Rofecoxib Hives   Hydrocodone Hives   Tolerates oxycodone and tramadol      Medication List    STOP taking these medications   amiodarone 200 MG tablet Commonly known as: PACERONE   cefTRIAXone  IVPB Commonly known as: ROCEPHIN   diltiazem 120 MG 24 hr capsule Commonly known as: CARDIZEM CD   ferrous sulfate 300 (60 Fe) MG/5ML syrup   gabapentin 300 MG capsule Commonly known as: NEURONTIN   lovastatin 20 MG tablet Commonly known as: MEVACOR   metoprolol tartrate 50 MG tablet Commonly known as: LOPRESSOR   pantoprazole 40 MG tablet Commonly known as: PROTONIX    senna-docusate 8.6-50 MG tablet Commonly known as: Senokot-S   traMADol 50 MG tablet Commonly known as: ULTRAM   Vitamin D (Ergocalciferol) 1.25 MG (50000 UNIT) Caps capsule Commonly known as: DRISDOL   warfarin 5 MG tablet Commonly known as: COUMADIN     TAKE these medications   acetaminophen 650 MG suppository Commonly known as: TYLENOL Place 1 suppository (650 mg total) rectally every 4 (four) hours as needed for fever.   atropine 1 % ophthalmic solution Place 2 drops under the tongue 4 (four) times daily as needed (increased oral secretions).   morphine CONCENTRATE 10 MG/0.5ML Soln concentrated solution Take 0.13 mLs (2.6 mg total) by mouth every 4 (four) hours.   polyvinyl alcohol 1.4 % ophthalmic solution Commonly known as: LIQUIFILM TEARS Place 2 drops into both eyes as needed for dry eyes.   scopolamine 1 MG/3DAYS Commonly known as: TRANSDERM-SCOP Place 1 patch (1.5 mg total) onto the skin every 3 (three) days.      Allergies  Allergen Reactions  . Aspirin Hives and Swelling    Angioedema   . Rofecoxib Hives  . Hydrocodone Hives    Tolerates oxycodone and tramadol    The results of significant diagnostics from this hospitalization (including imaging, microbiology, ancillary and laboratory) are listed below for reference.    Significant Diagnostic Studies: DG Chest 1 View  Result Date: 03/31/2019 CLINICAL DATA:  Infected wound postop EXAM: CHEST  1 VIEW COMPARISON:  March 09, 2019 FINDINGS: There is unchanged cardiomegaly. A left-sided pacemaker seen. Overlying spinal stimulator leads. There is a small left pleural effusion. Hazy patchy airspace opacity at both lung bases. Pulmonary vascular congestion. No acute osseous abnormality. IMPRESSION: Small left pleural effusion with pulmonary vascular congestion. Hazy airspace opacity seen at both lung bases which could be due to edema, atelectasis, and/or infectious etiology. Electronically Signed   By: Jonna Clark M.D.   On: 03/31/2019 02:34   CT HEAD WO CONTRAST  Result Date: 04/13/2019 CLINICAL DATA:  79 year old female with a history of acute mental status change EXAM: CT HEAD WITHOUT CONTRAST TECHNIQUE: Contiguous axial images were obtained from the base of the skull through the vertex without intravenous contrast. COMPARISON:  CT 04/10/2019, 02/02/2019, MR 11/29/2013 FINDINGS: Brain: No acute intracranial hemorrhage. No midline shift or mass effect. Gray-white differentiation maintained. Patchy hypodensity in the bilateral periventricular white matter. Unremarkable appearance of the ventricular system. Vascular: Calcifications of the intracranial vasculature. Skull: No acute fracture.  No aggressive bone lesion identified. Sinuses/Orbits: Unremarkable appearance of the orbits. Mastoid air cells clear. No middle ear effusion. No significant sinus disease. Other: Partially calcified transverse ligament at the C1-C2 level. IMPRESSION: Negative for acute intracranial abnormality. Redemonstration of chronic microvascular ischemic disease. Calcifications of the transverse ligament  at C1-C2 narrowing the thecal space. Electronically Signed   By: Gilmer Mor D.O.   On: 04/13/2019 14:20   CT HEAD WO CONTRAST  Result Date: 04/10/2019 CLINICAL DATA:  Mental status change EXAM: CT HEAD WITHOUT CONTRAST TECHNIQUE: Contiguous axial images were obtained from the base of the skull through the vertex without intravenous contrast. COMPARISON:  CT brain 02/02/2019 FINDINGS: Brain: Unconventional scanning plane on the axial images limits the exam. No acute territorial infarction, hemorrhage, or intracranial mass. Atrophy and mild hypodensity in the white matter consistent with chronic small vessel ischemic change. Stable ventricle size. Vascular: No hyperdense vessels.  Carotid vascular calcification Skull: Normal. Negative for fracture or focal lesion. Sinuses/Orbits: No acute finding. Other: None IMPRESSION: 1. No CT  evidence for acute intracranial abnormality. 2. Atrophy and mild small vessel ischemic changes the white matter. Electronically Signed   By: Jasmine Pang M.D.   On: 04/10/2019 03:16   CT ABDOMEN PELVIS W CONTRAST  Result Date: 04/10/2019 CLINICAL DATA:  Abdominal abscess/infection suspected Inpatient admitted with recent right hip disarticulation. EXAM: CT ABDOMEN AND PELVIS WITH CONTRAST TECHNIQUE: Multidetector CT imaging of the abdomen and pelvis was performed using the standard protocol following bolus administration of intravenous contrast. CONTRAST:  OMNIPAQUE IOHEXOL 300 MG/ML  SOLN COMPARISON:  Abdominopelvic CT 03/31/2019 FINDINGS: Lower chest: Small pleural effusions with compressive atelectasis. Calcified granuloma at the right lung base. Multi chamber cardiomegaly with pacemaker partially included. Hepatobiliary: Post cholecystectomy. No focal liver abnormality. No biliary dilatation. Pancreas: Parenchymal atrophy. No ductal dilatation or inflammation. Spleen: Normal in size without focal abnormality. Splenule at the hilum. Adrenals/Urinary Tract: No adrenal nodule. Multifocal right renal scarring. Prominent left renal atrophy, unchanged. Absent renal excretion from both renal collecting systems on delayed phase imaging. No hydronephrosis. Urinary bladder is partially distended. Improved bladder wall thickening from prior exam. No bladder wall thickening. Stomach/Bowel: Lack of enteric contrast limits detailed bowel assessment. No evidence of bowel obstruction or inflammation. Colonic diverticulosis without diverticulitis. Appendix not well visualized. Vascular/Lymphatic: Aortic and branch atherosclerosis. No aneurysm or acute vascular abnormality. The portal vein is patent. No gross abdominopelvic adenopathy. Reproductive: Uterus and bilateral adnexa are unremarkable. Other: Confluent subcutaneous edema, prominent in the flanks. Assess slightly progressed from prior exam. No intra-abdominal or  pelvic fluid collection. No free air. Musculoskeletal: Recent right hip disarticulation. Mottled air and heterogeneous fluid in the operative bed. Heterogeneous fluid extends to the skin surface inferiorly with small peripherally enhancing fluid collections. Largest collection measures 3.4 cm greatest dimension. Right obturator and gluteal muscles are heterogeneously enlarged with mottled air. The right ileus psoas muscle is heterogeneous enlarged with small foci of air. There is no evidence of active bleeding. Spinal stimulator in place. IMPRESSION: 1. Recent right hip disarticulation. Mottled air and heterogeneous fluid in the operative bed extending from the hip joint space to the skin surface. Small peripherally enhancing fluid collections just deep to the incision inferiorly, largest collection measures 3.4 cm greatest dimension. While findings may be related to recent surgery, sterility is indeterminate by imaging, and mottled air may represent infection. Heterogeneous enlargement and mottled air involving the right obturator, gluteal and iliopsoas muscles, may be postoperative or infectious. There is no evidence of active bleeding. 2. Body wall edema has slightly increased from prior exam. 3. Chronic findings include left renal atrophy and colonic diverticulosis. 4. Small pleural effusions and cardiomegaly are stable from prior. Aortic Atherosclerosis (ICD10-I70.0). Electronically Signed   By: Narda Rutherford M.D.   On: 04/10/2019 02:14  CT ABDOMEN PELVIS W CONTRAST  Result Date: 03/31/2019 CLINICAL DATA:  Right lower quadrant pain increased bleeding and worsening infection of right lower leg post amputation November. EXAM: CT ABDOMEN AND PELVIS WITH CONTRAST TECHNIQUE: Multidetector CT imaging of the abdomen and pelvis was performed using the standard protocol following bolus administration of intravenous contrast. CONTRAST:  OMNIPAQUE IOHEXOL 300 MG/ML  SOLN COMPARISON:  CT 03/03/2019 FINDINGS:  Lower chest: Small bilateral effusions. Adjacent passive atelectasis. Some patchy reticulonodular opacities in the bases could reflect atelectatic change though underlying infection or inflammation is not excluded. There is pronounced cardiomegaly with predominantly biatrial enlargement. Pacer wires terminate near the septum and right atrium. Coronary artery calcifications are present. Hepatobiliary: No focal liver abnormality is seen. Patient is post cholecystectomy. Slight prominence of the biliary tree likely related to reservoir effect. No calcified intraductal gallstones. Pancreas: Atrophic appearance of the pancreas. No peripancreatic inflammation or ductal dilatation. Spleen: Normal in size without focal abnormality. Adrenals/Urinary Tract: Asymmetric left renal atrophy. Extensive cortical scarring of the right kidney. Cortical cyst in the upper pole right kidney is similar to prior. No worrisome renal lesion. There is mild bilateral urothelial thickening and circumferential bladder wall thickening greater than expected for underdistention. No visible calculi or frank hydronephrosis. Stomach/Bowel: Distal esophagus, stomach and duodenal sweep are unremarkable. No small bowel wall thickening or dilatation. No evidence of obstruction. The appendix is not well visualized. A diminutive partially air-filled appendix is seen extending from the cecal tip without focal periappendiceal inflammation. Extensive pancolonic diverticula without focal pericolonic inflammation to suggest diverticulitis. Vascular/Lymphatic: No pathologically enlarged nodes in the abdomen or pelvis. Reproductive: Anteverted uterus. No concerning adnexal lesions. Other: A spinal stimulator pack is seen in the posterior soft tissues of the left flank with leads terminating at the T9 level. Small volume of free fluid seen layering in the deep pelvis, nonspecific given the extensive circumferential body wall edema. Furthermore, body wall edema  slightly asymmetric to the right hip, flank and abdominal wall. Overlying skin thickening is present as well which should be assessed for ascites. Musculoskeletal: There is increasing hyperdense expansion of the anterior muscular compartment the right thigh involving predominantly the sartorius, rectus femoris distal iliopsoas musculature as well as new hyperdense expansion within the medial thigh involving the right operator externus. Marked asymmetric overlying soft tissue swelling is worrisome for possible infection. These changes extend below the level of cross-sectional imaging. Stable lobular simple fluid attenuation cystic structure anterior to the left greater trochanter is suggestive of a bursitis. Multilevel degenerative changes are present in the imaged portions of the spine. Severe S-shaped scoliotic curvature of the spine with associated chest wall deformity and pelvic tilt is similar to comparison studies. Moderate to severe degenerative changes in both hips. IMPRESSION: 1. Increased hyperdense expansion of the anterior muscular compartment the right thigh with new hyperdense expansion within the proximal medial thigh. Features are suggestive intramuscular hemorrhage/hematoma. However, given marked asymmetric overlying soft tissue swelling, underlying infection cannot be fully excluded. 2. Circumferential bladder wall thickening and bilateral ureteral urothelial thickening concerning for a cystitis and possible ascending urinary tract infection. Correlate with urinalysis. 3. Additional features of anasarca with bilateral effusions, severe body wall edema, and trace ascites. 4. Some patchy reticulonodular opacities in the bases could reflect atelectatic change though underlying infection or inflammation is not excluded. 5. Cardiomegaly with predominantly biatrial enlargement. 6. Asymmetric left renal atrophy. Cortical scarring in the right kidney, similar to prior. 7. Aortic Atherosclerosis  (ICD10-I70.0). 8. Additional chronic and incidental  findings, detailed above. These results were called by telephone at the time of interpretation on 03/31/2019 at 2:38 am to provider [facility] , who verbally acknowledged these results. Electronically Signed   By: Kreg Shropshire M.D.   On: 03/31/2019 02:39   CT FEMUR RIGHT W CONTRAST  Result Date: 03/31/2019 CLINICAL DATA:  Lower extremity infection versus hematoma, amputation in November EXAM: CT OF THE LOWER RIGHT EXTREMITY WITH CONTRAST TECHNIQUE: Multidetector CT imaging of the lower right extremity was performed according to the standard protocol following intravenous contrast administration. COMPARISON:  CT same day CONTRAST:  OMNIPAQUE IOHEXOL 300 MG/ML  SOLN FINDINGS: Bones/Joint/Cartilage The patient is status post midshaft femoral amputation. No fracture or dislocation is seen. No areas of cortical destruction or periosteal reaction. There is advanced right hip osteoarthritis with joint space loss and marginal osteophyte formation. Diffuse osteopenia seen. Ligaments Suboptimally assessed by CT. Muscles and Tendons There is diffusely heterogeneously enhancing enlargement throughout the muscles surrounding the femur extending the way to the level of the greater trochanter. This extends the overlying amputation site where there focal areas ulceration skin thickening heterogeneous fluid within the soft tissues. Definite loculated fluid collection. Soft tissues Area ulceration seen at the amputation site with diffuse subcutaneous edema skin thickening. No subcutaneous emphysema seen soft tissues. The visualized deep pelvis is grossly. Scattered vascular calcifications noted. IMPRESSION: 1. Findings suggestive of diffuse intramuscular hematoma surrounding the lower extremity with probable superimposed infection extending from the amputation site. 2. Areas of ulceration at the amputation site with non loculated fluid which could represent  phlegmon/early abscess. There is diffuse cellulitis surrounding the lower extremity. No definite evidence of osteomyelitis. Electronically Signed   By: Jonna Clark M.D.   On: 03/31/2019 03:42   DG CHEST PORT 1 VIEW  Result Date: 04/09/2019 CLINICAL DATA:  Shortness of breath EXAM: PORTABLE CHEST 1 VIEW COMPARISON:  March 31, 2019 FINDINGS: The heart size remains enlarged. There is a persistent small left-sided pleural effusion. There are improved hazy bilateral airspace opacities. There is no pneumothorax. There appears to be some atelectasis at the lung bases bilaterally. There is a multi lead left-sided pacemaker in place. Aortic calcifications are noted. IMPRESSION: 1. Cardiomegaly with vascular congestion, improved from prior study. 2. Persistent but improved hazy bilateral airspace opacities favored to represent resolving pulmonary edema with an atypical infectious process not excluded. 3. Persistent small left-sided pleural effusion. Electronically Signed   By: Katherine Mantle M.D.   On: 04/09/2019 19:20   DG Abd Portable 1V  Result Date: 04/14/2019 CLINICAL DATA:  Feeding tube placement. EXAM: PORTABLE ABDOMEN - 1 VIEW COMPARISON:  None. FINDINGS: Feeding tube tip is in the region of the antrum of the stomach. Bowel gas pattern is normal. Thoracic spinal cord stimulator noted with the generator overlying the upper abdomen. No acute bone abnormality. IMPRESSION: Feeding tube tip is in the antrum of the stomach. Electronically Signed   By: Francene Boyers M.D.   On: 04/14/2019 12:00   DG FEMUR, MIN 2 VIEWS RIGHT  Result Date: 03/31/2019 CLINICAL DATA:  Infected, postop EXAM: RIGHT FEMUR 2 VIEWS COMPARISON:  None. FINDINGS: The patient is status post mid femur amputation. There is diffuse soft tissue swelling. No fracture or dislocation is seen. There is diffuse osteopenia. Dense vascular calcifications. IMPRESSION: Status post amputation.  No definite acute osseous abnormality. Electronically  Signed   By: Jonna Clark M.D.   On: 03/31/2019 02:33    Microbiology: Recent Results (from the past 240 hour(s))  Culture, blood (routine x 2)     Status: None   Collection Time: 04/09/19  8:06 PM   Specimen: BLOOD  Result Value Ref Range Status   Specimen Description BLOOD LEFT ANTECUBITAL  Final   Special Requests   Final    BOTTLES DRAWN AEROBIC AND ANAEROBIC Blood Culture results may not be optimal due to an inadequate volume of blood received in culture bottles   Culture   Final    NO GROWTH 5 DAYS Performed at [facility], [address], [place], [place] [address]    Report Status 04/14/2019 FINAL  Final  Culture, blood (routine x 2)     Status: None   Collection Time: 04/09/19  8:16 PM   Specimen: BLOOD  Result Value Ref Range Status   Specimen Description BLOOD LEFT ANTECUBITAL  Final   Special Requests   Final    BOTTLES DRAWN AEROBIC AND ANAEROBIC Blood Culture results may not be optimal due to an inadequate volume of blood received in culture bottles   Culture   Final    NO GROWTH 5 DAYS Performed at [facility], [address] [place], [place] [address]    Report Status 04/14/2019 FINAL  Final     Labs: Basic Metabolic Panel: Recent Labs  Lab 04/13/19 1500 04/14/19 0500 04/14/19 1700 04/15/19 0455 04/16/19 0504 04/16/19 1720 04/17/19 0500  NA  --  146*  --  146* 146* 145 148*  K  --  3.7  --  3.2* 3.0* 3.4* 3.5  CL  --  115*  --  115* 120* 117* 119*  CO2  --  23  --  23 23 21* 22  GLUCOSE  --  86  --  95 86 140* 158*  BUN  --  20  --  21 31* 37* 43*  CREATININE  --  0.81  --  0.96 1.06* 1.14* 1.19*  CALCIUM  --  7.7*  --  7.7* 7.7* 7.6* 7.7*  MG 1.6* 2.4 2.2  --   --   --   --   PHOS 2.7 2.7 2.8  --   --   --   --    Liver Function Tests: Recent Labs  Lab 04/15/19 0455  AST 17  ALT 9  ALKPHOS 69  BILITOT 0.4  PROT 4.1*  ALBUMIN 1.4*   No results for input(s): LIPASE, AMYLASE in the last 168 hours. No results for input(s):  AMMONIA in the last 168 hours. CBC: Recent Labs  Lab 04/11/19 2153 04/12/19 0346 04/13/19 0535 04/14/19 0500 04/15/19 0455  WBC  --  17.2* 23.2* 21.1* 17.7*  NEUTROABS  --  13.2* 19.9* 17.2* 14.1*  HGB 10.2* 10.0* 8.3* 7.1* 7.1*  HCT 30.1* 30.7* 25.3* 22.4* 22.7*  MCV  --  89.0 87.2 89.6 91.9  PLT  --  203 157 181 192   Cardiac Enzymes: No results for input(s): CKTOTAL, CKMB, CKMBINDEX, TROPONINI in the last 168 hours. BNP: BNP (last 3 results) Recent Labs    03/31/19 0239  BNP 186.4*    ProBNP (last 3 results) No results for input(s): PROBNP in the last 8760 hours.  CBG: Recent Labs  Lab 04/16/19 2022 04/17/19 0045 04/17/19 0504 04/17/19 0820 04/17/19 1204  GLUCAP 117* 177* 140* 127* 116*    Principal Problem:   Wound dehiscence Active Problems:   Essential hypertension   Goals of care, counseling/discussion   AF (paroxysmal atrial fibrillation) (HCC)   History of bacteremia  due to Escherichia coli   Palliative care by specialist   History of disarticulation of right hip   Aortic valve endocarditis   Anasarca   Encounter for hospice care discussion   DNR (do not resuscitate) discussion   Time coordinating discharge: 38 minutes.  Signed:        Ritchard Paragas, DO [facility]  04/18/2019, 10:00 AM

## 2019-04-19 ENCOUNTER — Telehealth: Payer: Self-pay | Admitting: *Deleted

## 2019-04-19 NOTE — Telephone Encounter (Signed)
Pt was on TCM She was admitted 03/31/19 under hospital service with Ortho to consult. She was given Rocephin and vancomycin in ED. She is scheduled to have right hip disarticulation with Dr. Lajoyce Corners. She underwent hip disarticulation in the OR on 04/05/19 and postoperatively she had some hemorrhagic shock and became hypotensive received 3 units of albumin Neo-Synephrine as well as 3 units of PRBCs and was transferred to the intensive care unit for further monitoring. Patient discharged 04/18/19 to a residential hospice facility [facility].Marland KitchenRaechel Chute

## 2019-05-08 DEATH — deceased

## 2021-05-04 IMAGING — CT CT HEAD W/O CM
3 series · 14 of 47 positions shown, 16 images · non-contrast
Comparison: None.

CLINICAL DATA: Fall altered mentation

EXAM:
CT HEAD WITHOUT CONTRAST
CT CERVICAL SPINE WITHOUT CONTRAST
TECHNIQUE: Multidetector CT imaging of the head and cervical spine was
performed following the standard protocol without intravenous
contrast. Multiplanar CT image reconstructions of the cervical spine
were also generated.

[Series 3: head 5.0 h30s · axial · 0.44mm/px · z∈[-92,+58]mm · 8 of 36 slices shown, 10 images]
[im 3/36  brain]
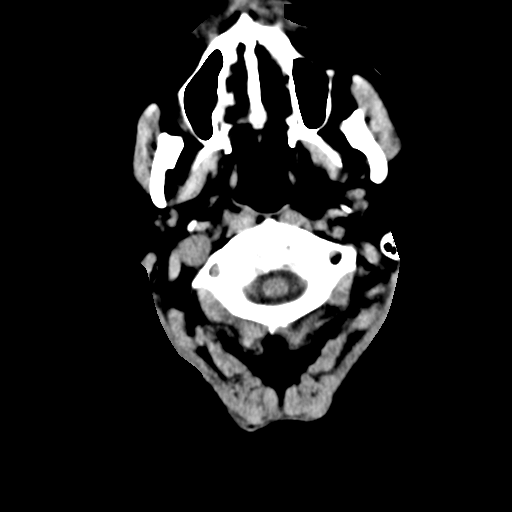
[im 3/36  bone]
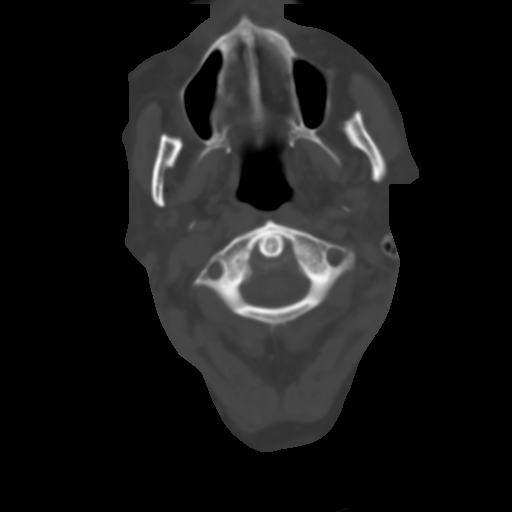
[im 8/36  brain]
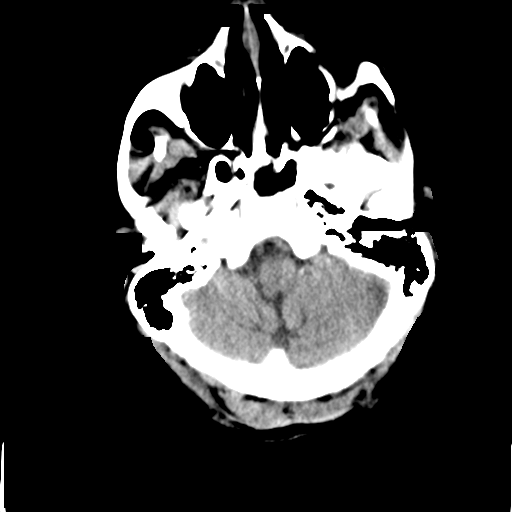
[im 11/36  brain]
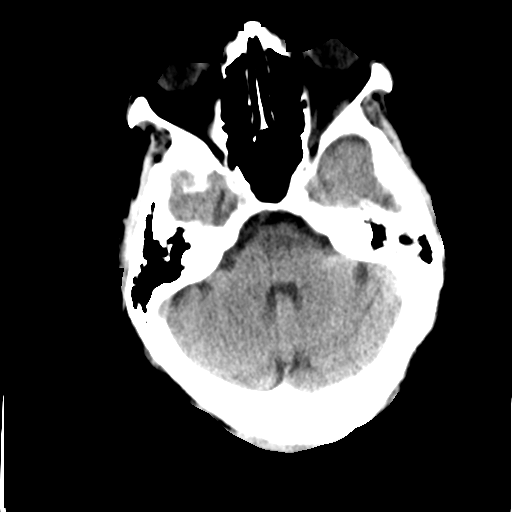
[im 16/36  brain]
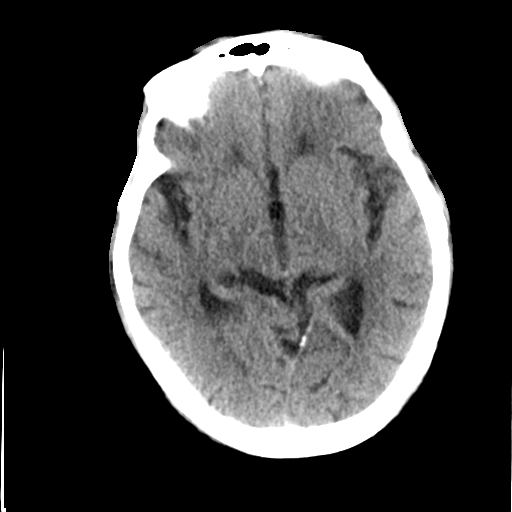
[im 20/36  brain]
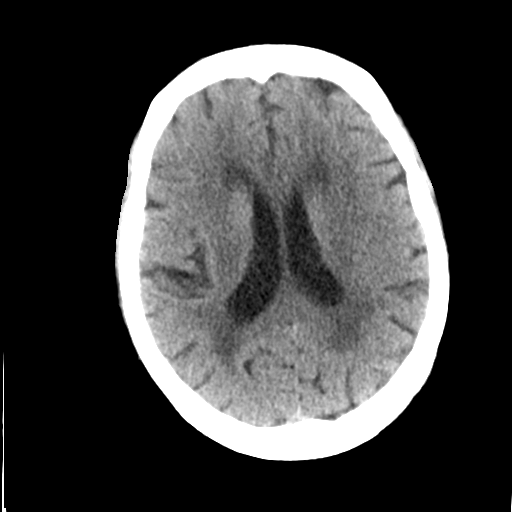
[im 20/36  bone]
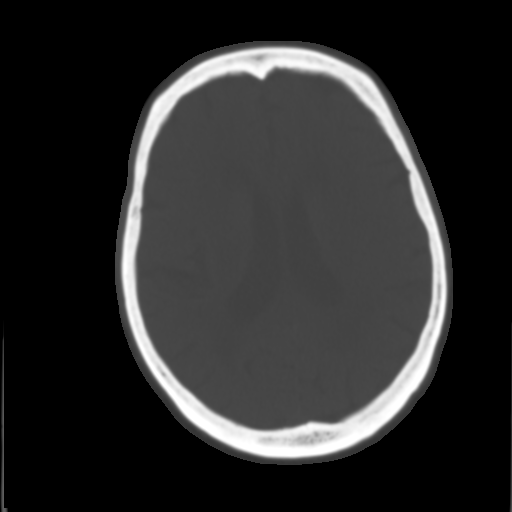
[im 25/36  brain]
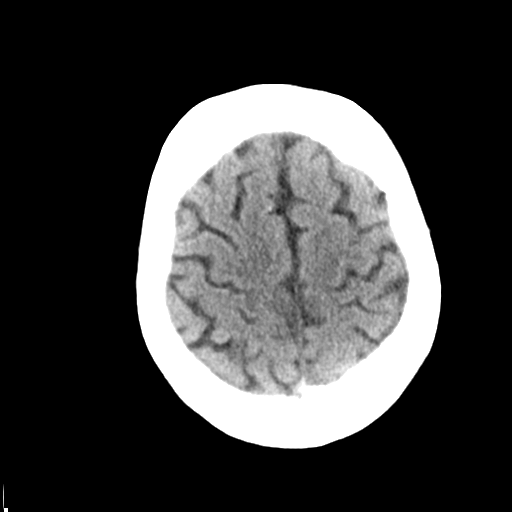
[im 28/36  brain]
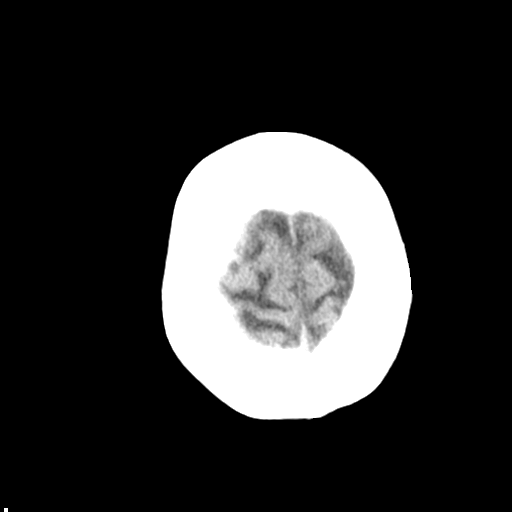
[im 33/36  brain]
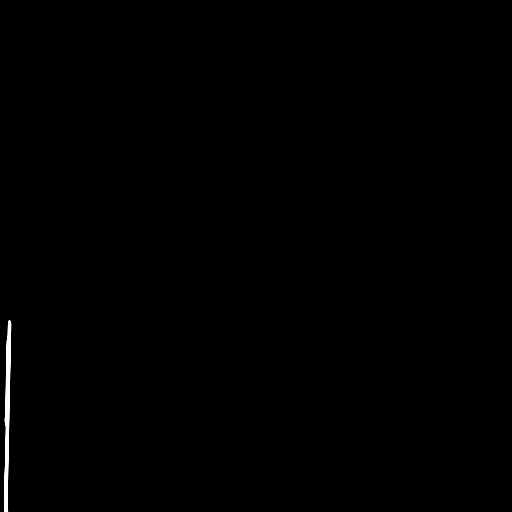

[Series 5: head 3.0 mpr cor · coronal · 0.35mm/px · 3 of 77 slices shown]
[im 26/77  brain]
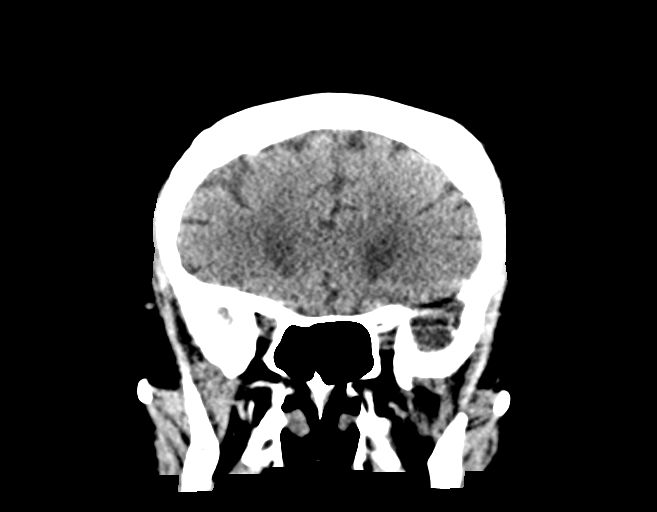
[im 34/77  brain]
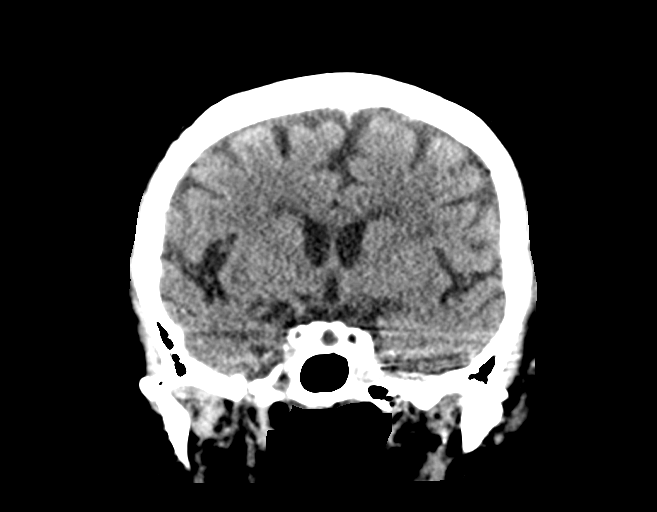
[im 43/77  brain]
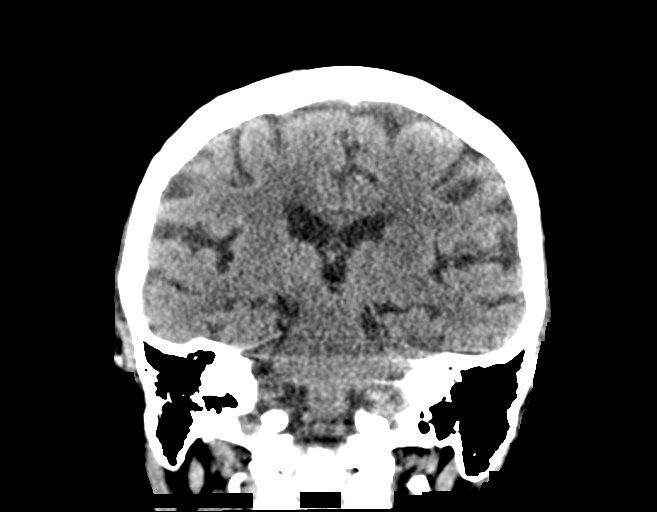

[Series 6: head 3.0 mpr sag · sagittal · 0.35mm/px · 3 of 66 slices shown]
[im 22/66  brain]
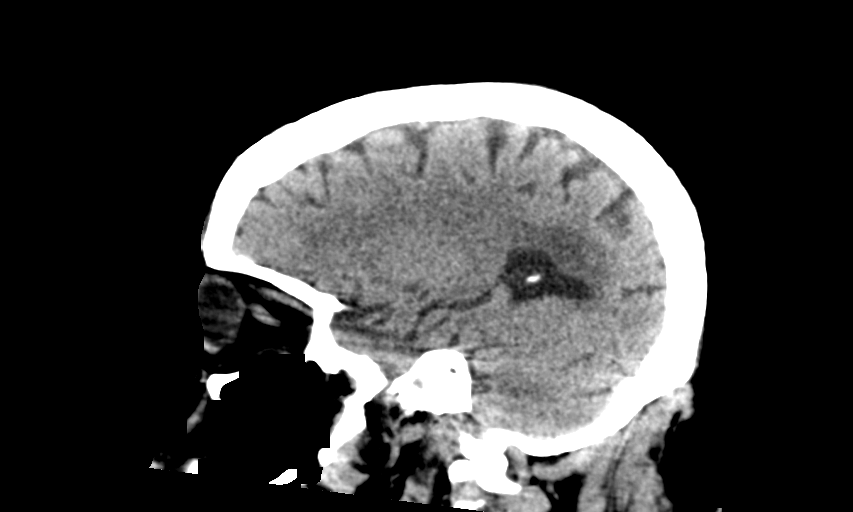
[im 33/66  brain]
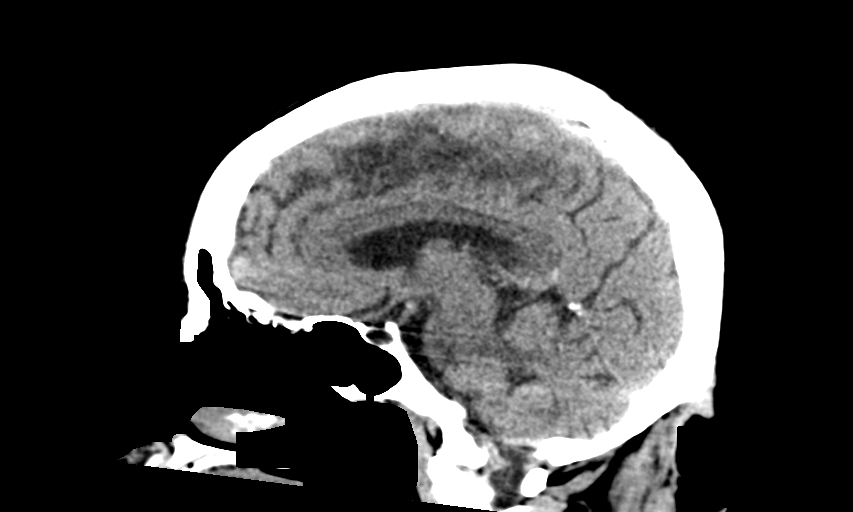
[im 44/66  brain]
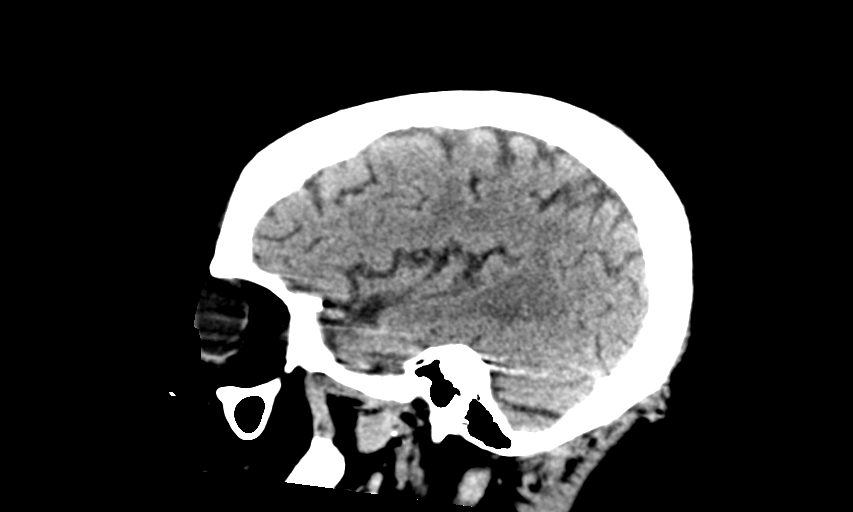

[14 of 47 positions shown; findings below may reference images not displayed]

FINDINGS: CT HEAD FINDINGS

Brain: No acute territorial infarction, hemorrhage or intracranial
mass. Atrophy and moderate small vessel ischemic changes of the
white matter. Nonenlarged ventricles. Age indeterminate lacunar
infarct in the right cerebellum.

Vascular: No hyperdense vessels.  Carotid vascular calcification

Skull: Normal. Negative for fracture or focal lesion.

Sinuses/Orbits: No acute finding.

Other: None

CT CERVICAL SPINE FINDINGS

Alignment: No subluxation.  Facet alignment within normal limits

Skull base and vertebrae: No acute fracture. No primary bone lesion
or focal pathologic process.

Soft tissues and spinal canal: No prevertebral fluid or swelling. No
visible canal hematoma.

Disc levels: Moderate diffuse degenerative change throughout the
cervical spine with multiple level disc space narrowing and
osteophyte. Posterior disc osteophyte complex C5-C6 and C6-C7 with
indentation of anterior thecal sac. Facet degenerative change at
multiple levels.

Upper chest: Negative.

Other: None
IMPRESSION: 1. Negative for acute intracranial hemorrhage or mass. Possible age
indeterminate lacunar infarct right cerebellum. Atrophy and small
vessel ischemic changes of the white matter
2. Diffuse degenerative changes of the cervical spine. No acute
osseous abnormality

## 2021-05-04 IMAGING — DX DG KNEE 1-2V*R*
4 series · 4 of 4 positions shown · non-contrast
Comparison: CT knee 06/09/2017

CLINICAL DATA: Pain, concern for infection

EXAM:
RIGHT KNEE - 1-2 VIEW

[knee ap (1 of 2)]
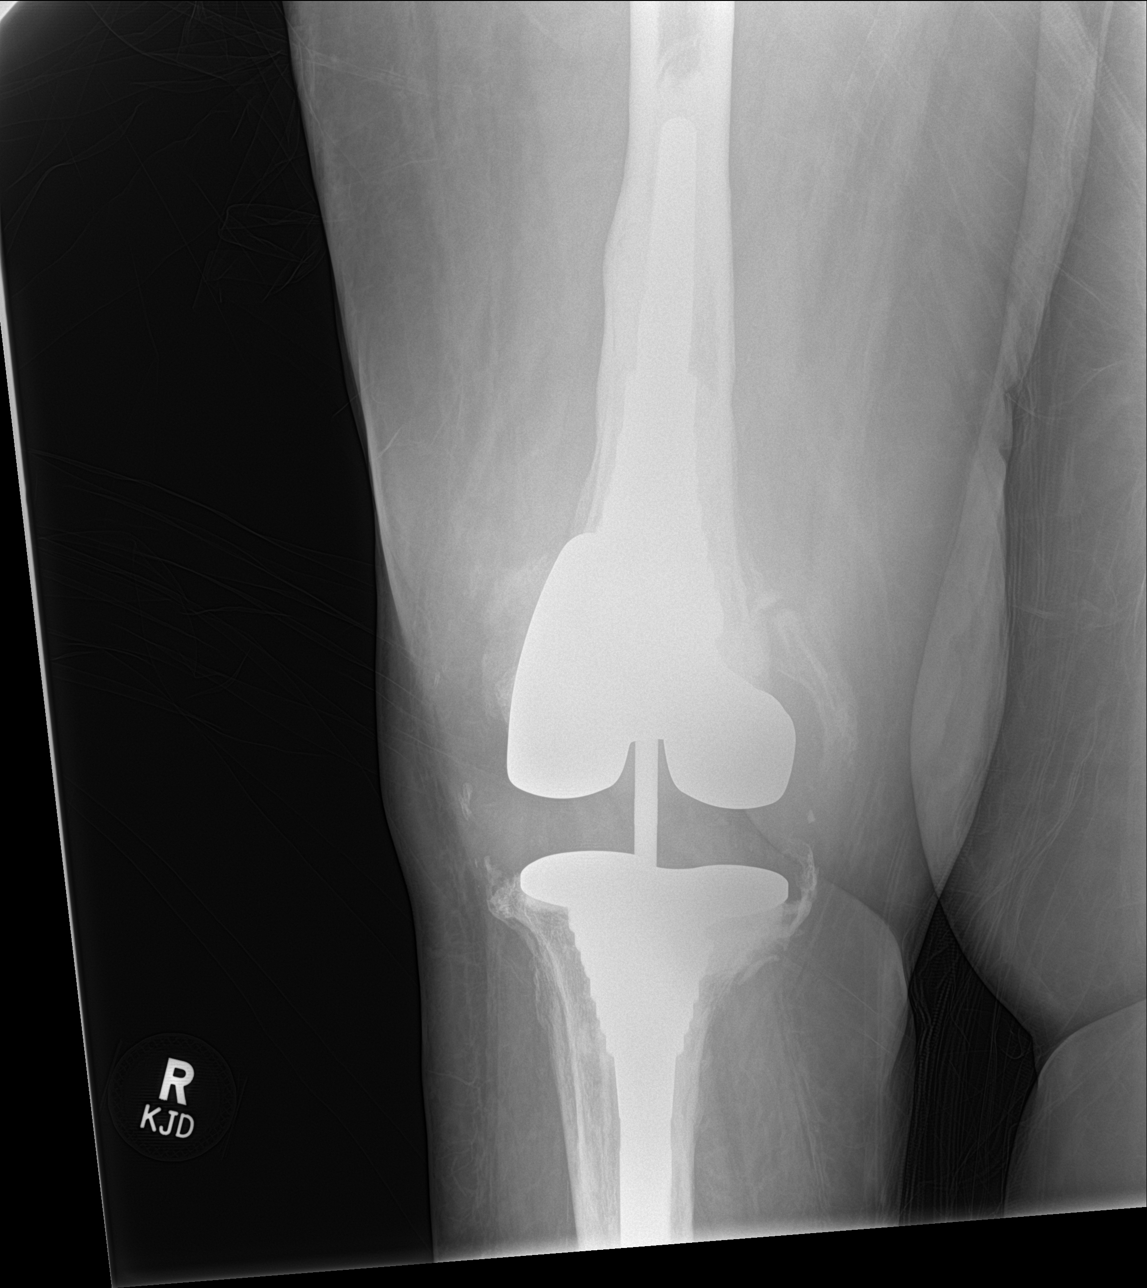

[knee lat (1 of 2)]
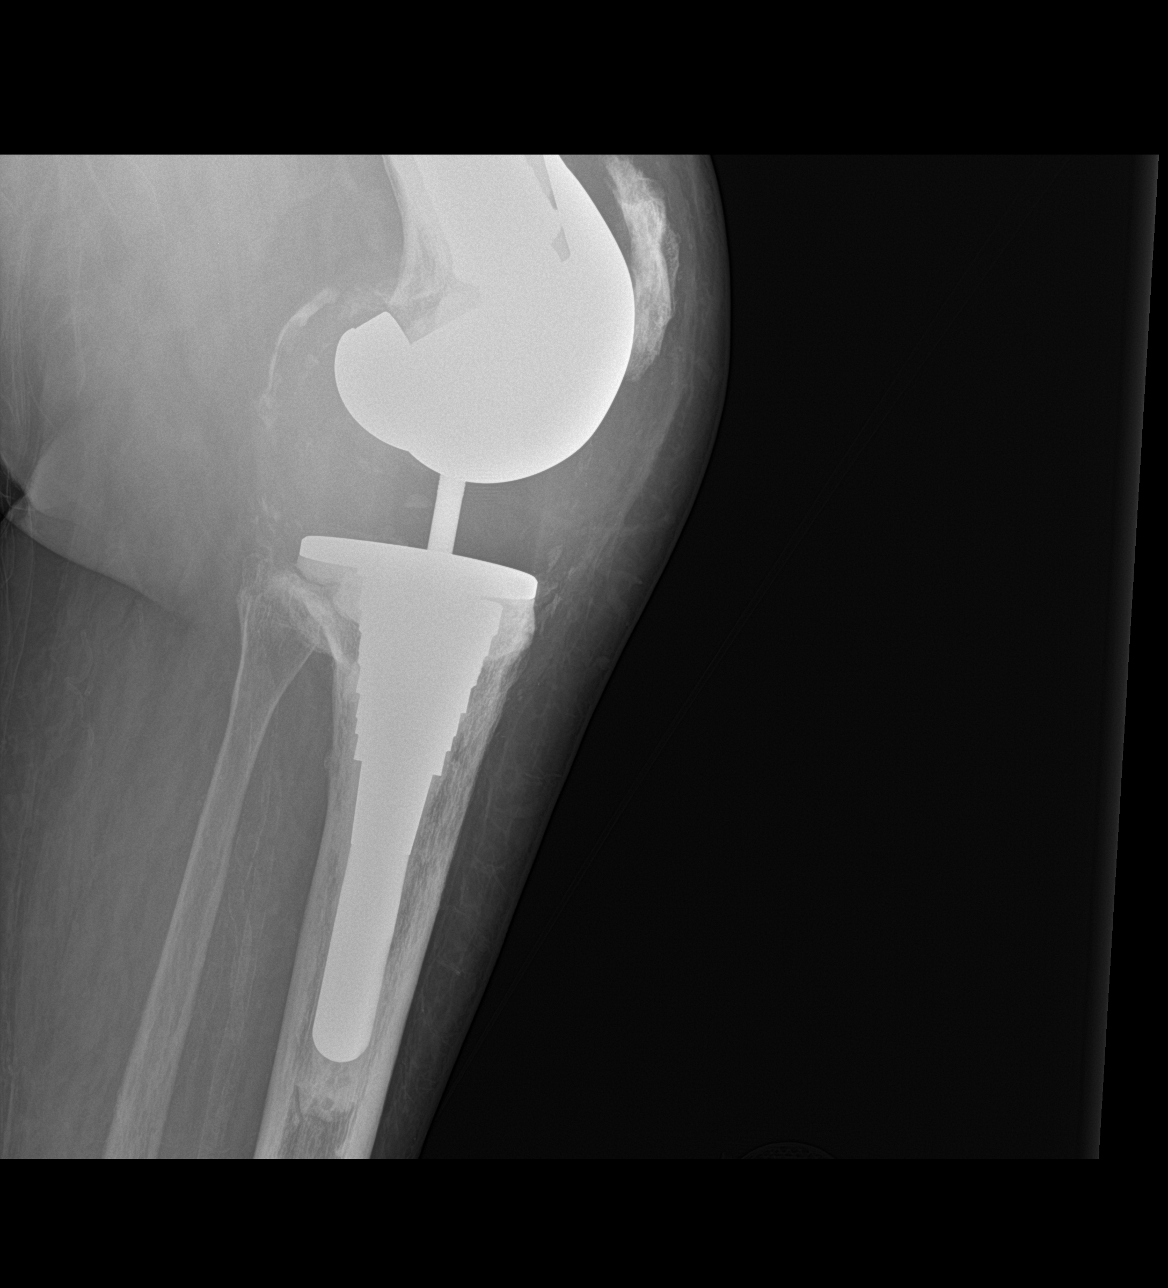

[knee ap (2 of 2)]
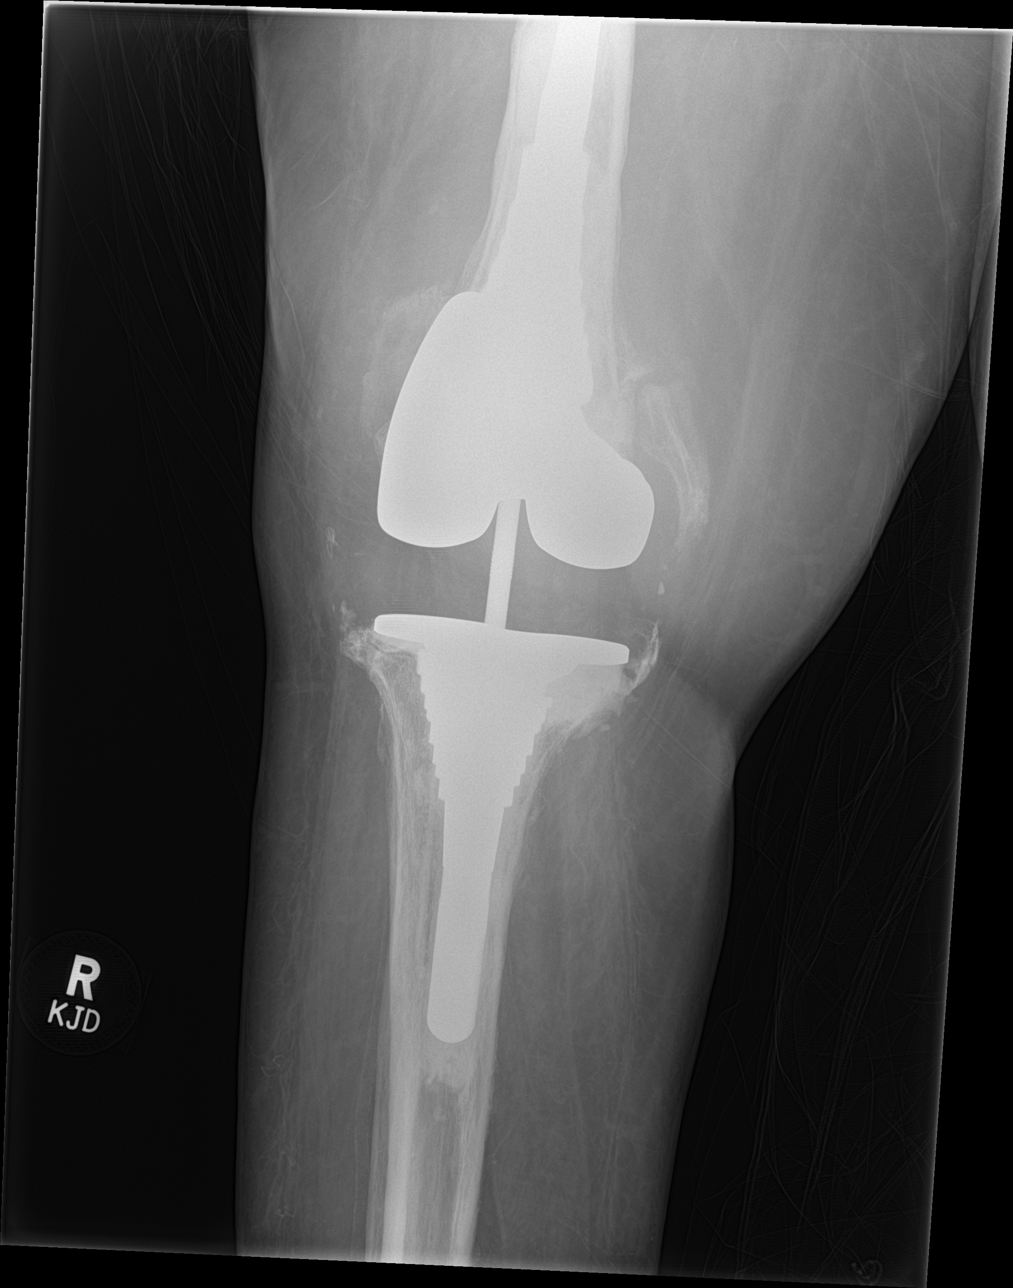

[knee lat (2 of 2)]
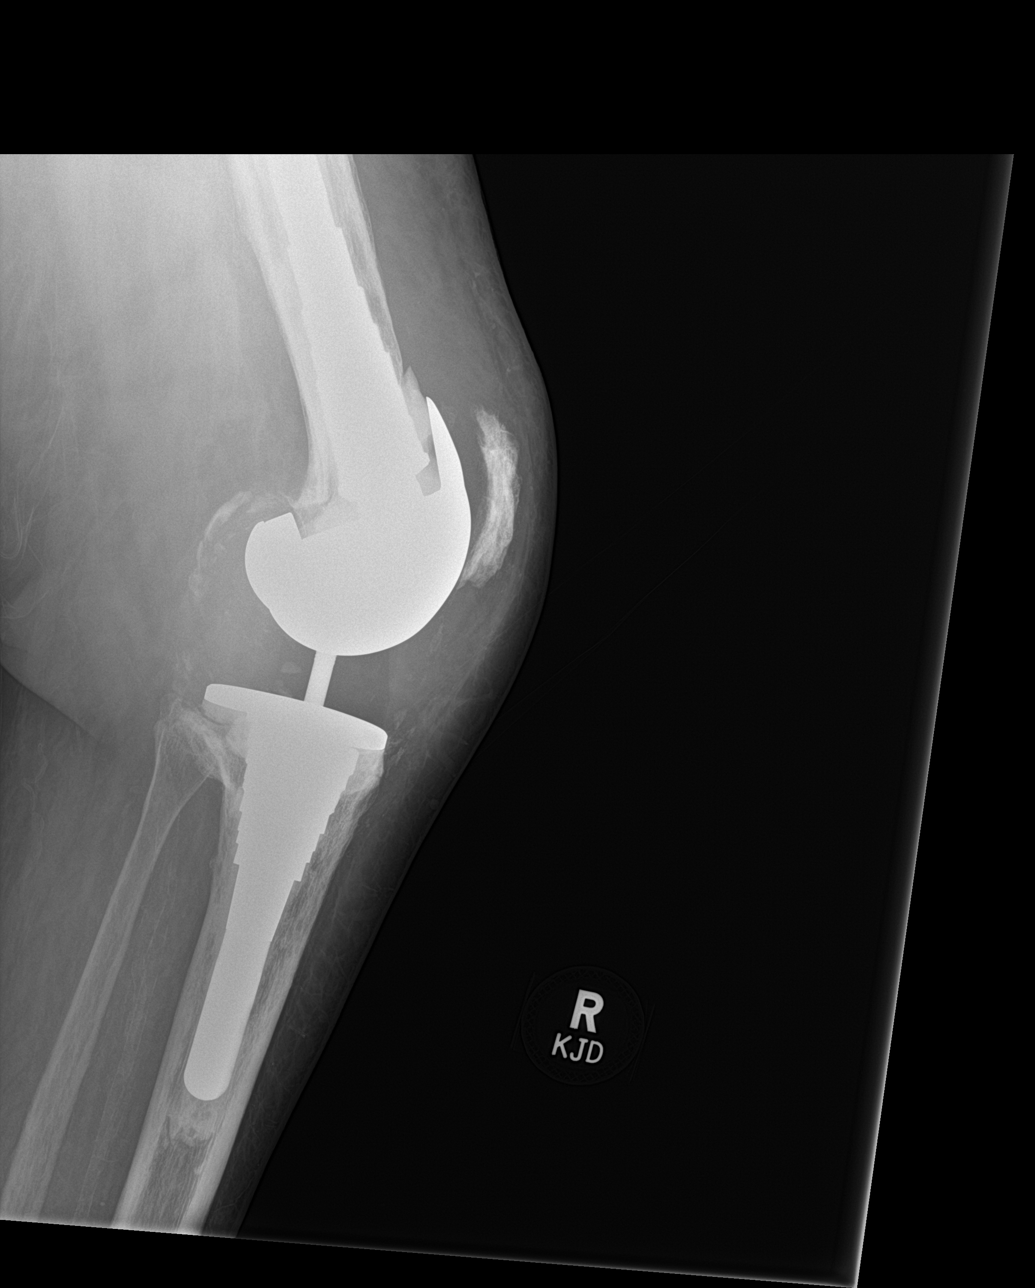

[4 of 4 positions shown; findings below may reference images not displayed]

FINDINGS: Extensive circumferential soft tissue swelling with a more large
effusion and destructive change in lucency of the bone surrounding
cemented femoral stem of the patient's total knee arthroplasty. Some
destructive changes are noted near the posterior aspect of the
tibial hardware is well. Alignment of the arthroplasty components is
maintained. Sclerotic changes of the patella are more pronounced
than on comparison exam.
IMPRESSION: 1. Findings suspicious for septic arthritis and osteomyelitic
changes of the distal femur, proximal tibia and patella
2. Hardware remains normally aligned.
3. Extensive circumferential soft tissue swelling.
